# Patient Record
Sex: Female | Born: 1965
Health system: Southern US, Community
[De-identification: ages and names within clinical notes are randomized; demographics above are authoritative.]

## PROBLEM LIST (undated history)

## (undated) DIAGNOSIS — Z973 Presence of spectacles and contact lenses: Secondary | ICD-10-CM

## (undated) DIAGNOSIS — R509 Fever, unspecified: Secondary | ICD-10-CM

## (undated) DIAGNOSIS — K219 Gastro-esophageal reflux disease without esophagitis: Secondary | ICD-10-CM

## (undated) DIAGNOSIS — N186 End stage renal disease: Secondary | ICD-10-CM

## (undated) DIAGNOSIS — Z8639 Personal history of other endocrine, nutritional and metabolic disease: Secondary | ICD-10-CM

## (undated) DIAGNOSIS — M1990 Unspecified osteoarthritis, unspecified site: Secondary | ICD-10-CM

## (undated) DIAGNOSIS — L309 Dermatitis, unspecified: Secondary | ICD-10-CM

## (undated) DIAGNOSIS — Z940 Kidney transplant status: Secondary | ICD-10-CM

## (undated) DIAGNOSIS — R51 Headache: Secondary | ICD-10-CM

## (undated) DIAGNOSIS — I10 Essential (primary) hypertension: Secondary | ICD-10-CM

## (undated) DIAGNOSIS — R0602 Shortness of breath: Secondary | ICD-10-CM

## (undated) HISTORY — DX: Personal history of other endocrine, nutritional and metabolic disease: Z86.39

## (undated) HISTORY — PX: BREAST BIOPSY: SHX20

## (undated) HISTORY — PX: WISDOM TOOTH EXTRACTION: SHX21

## (undated) HISTORY — DX: Fever, unspecified: R50.9

## (undated) HISTORY — PX: FRACTURE SURGERY: SHX138

## (undated) HISTORY — DX: Essential (primary) hypertension: I10

## (undated) HISTORY — PX: AV FISTULA PLACEMENT: SHX1204

## (undated) HISTORY — DX: End stage renal disease: N18.6

## (undated) HISTORY — DX: Kidney transplant status: Z94.0

## (undated) SURGERY — Surgical Case
Anesthesia: *Unknown

---

## 1997-12-24 ENCOUNTER — Emergency Department (HOSPITAL_COMMUNITY): Admission: EM | Admit: 1997-12-24 | Discharge: 1997-12-24 | Payer: Self-pay | Admitting: Emergency Medicine

## 1997-12-26 ENCOUNTER — Emergency Department (HOSPITAL_COMMUNITY): Admission: EM | Admit: 1997-12-26 | Discharge: 1997-12-26 | Payer: Self-pay | Admitting: Emergency Medicine

## 2000-12-27 ENCOUNTER — Emergency Department (HOSPITAL_COMMUNITY): Admission: EM | Admit: 2000-12-27 | Discharge: 2000-12-27 | Payer: Self-pay | Admitting: Emergency Medicine

## 2000-12-29 ENCOUNTER — Encounter: Admission: RE | Admit: 2000-12-29 | Discharge: 2000-12-29 | Payer: Self-pay | Admitting: General Practice

## 2000-12-29 ENCOUNTER — Encounter: Payer: Self-pay | Admitting: General Practice

## 2004-05-21 ENCOUNTER — Emergency Department (HOSPITAL_COMMUNITY): Admission: EM | Admit: 2004-05-21 | Discharge: 2004-05-21 | Payer: Self-pay | Admitting: Emergency Medicine

## 2006-01-18 ENCOUNTER — Emergency Department (HOSPITAL_COMMUNITY): Admission: EM | Admit: 2006-01-18 | Discharge: 2006-01-18 | Payer: Self-pay | Admitting: Emergency Medicine
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
## 2007-01-12 ENCOUNTER — Inpatient Hospital Stay (HOSPITAL_COMMUNITY): Admission: EM | Admit: 2007-01-12 | Discharge: 2007-01-21 | Payer: Self-pay | Admitting: Emergency Medicine

## 2007-01-13 ENCOUNTER — Encounter (INDEPENDENT_AMBULATORY_CARE_PROVIDER_SITE_OTHER): Payer: Self-pay | Admitting: Nephrology

## 2007-01-13 ENCOUNTER — Encounter (INDEPENDENT_AMBULATORY_CARE_PROVIDER_SITE_OTHER): Payer: Self-pay | Admitting: Internal Medicine

## 2007-01-13 IMAGING — US US RENAL PORT
1 series · 14 of 25 positions shown · non-contrast
Comparison: None.

CLINICAL DATA: High blood pressure.   Evaluate renal size.  
RENAL/URINARY TRACT ULTRASOUND:
TECHNIQUE: Complete ultrasound examination of the urinary tract was performed including evaluation of the kidneys, renal collecting systems, and urinary bladder.

[Series 1: unknown · 0.32mm/px · 14 of 25 slices shown]
[im 1/25]
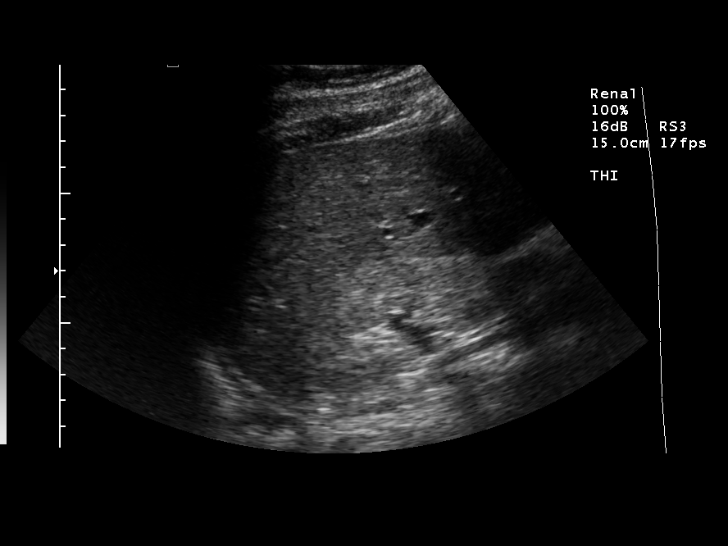
[im 3/25]
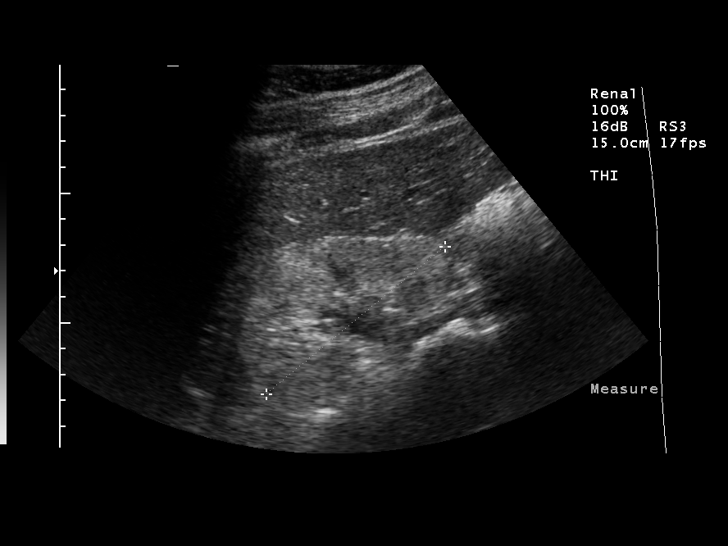
[im 5/25]
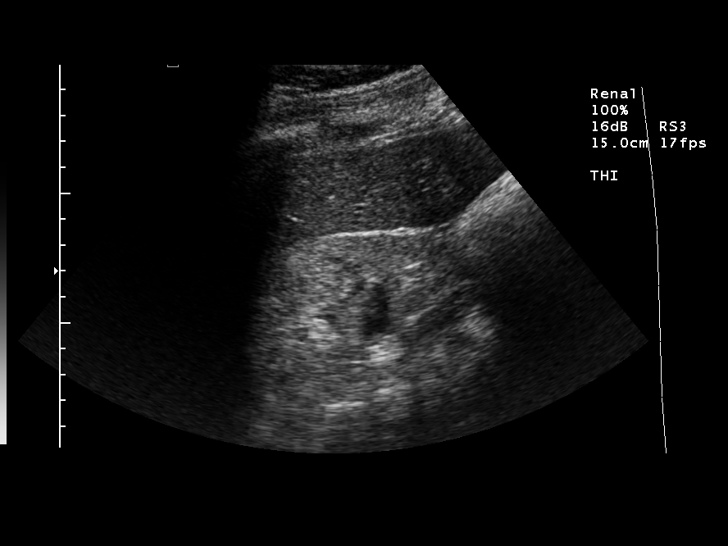
[im 7/25]
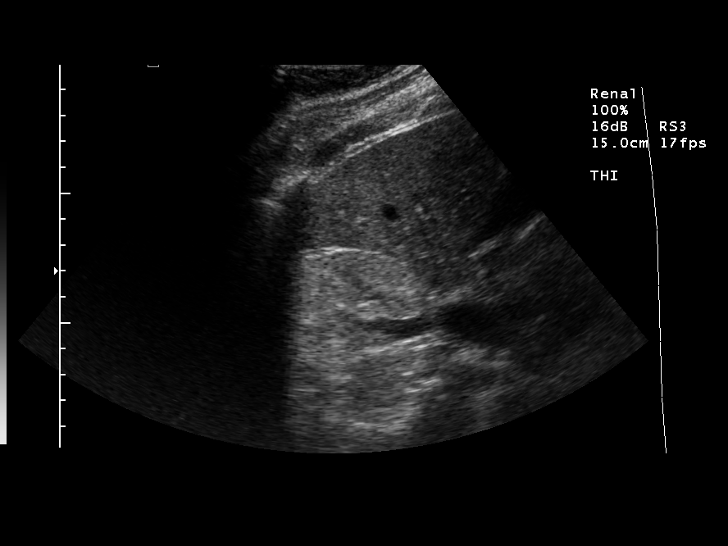
[im 9/25]
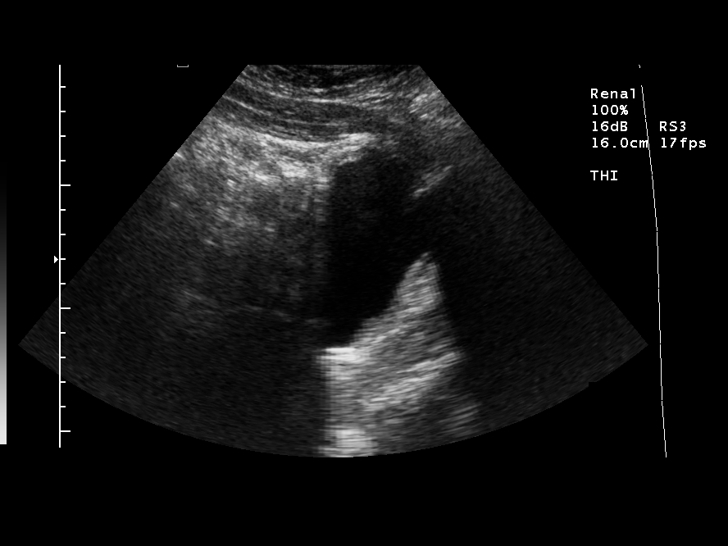
[im 10/25]
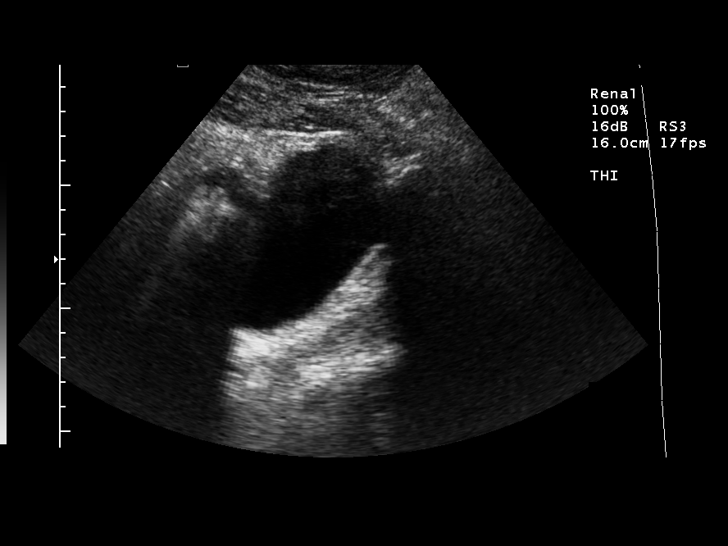
[im 12/25]
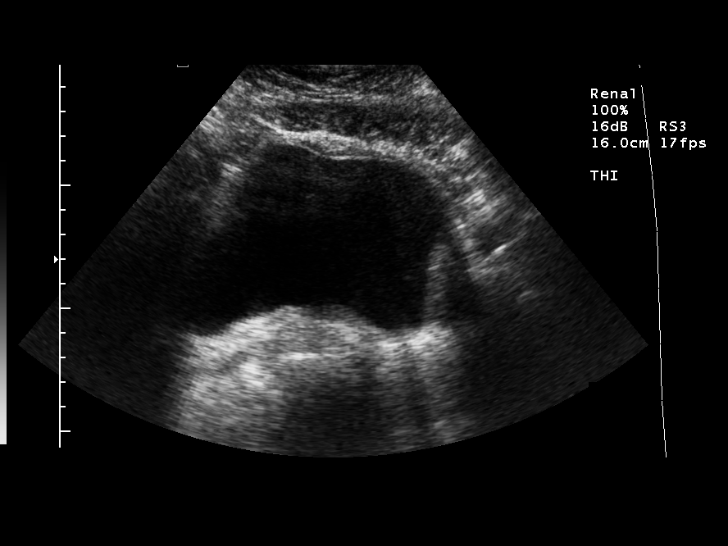
[im 14/25]
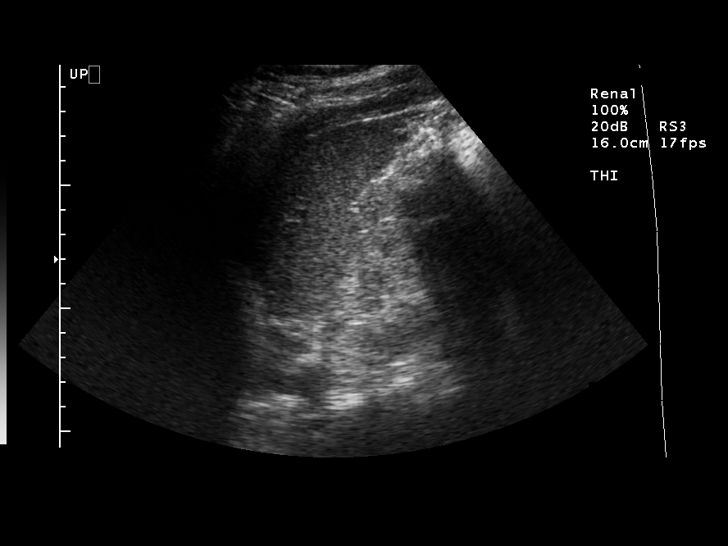
[im 16/25]
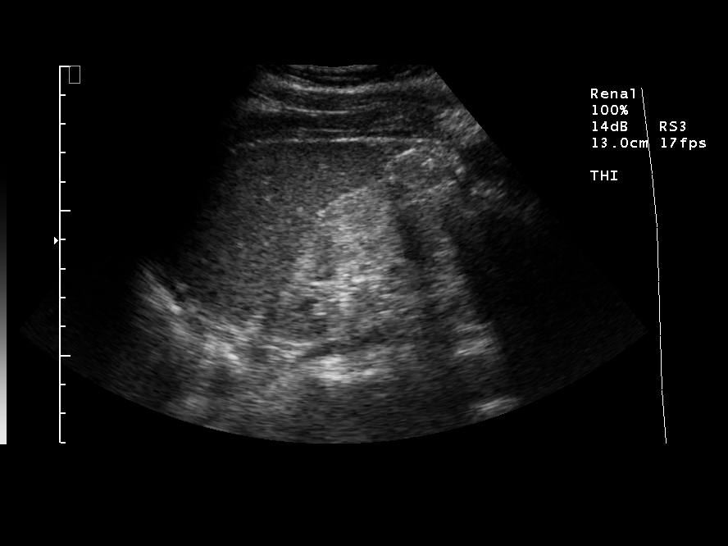
[im 17/25]
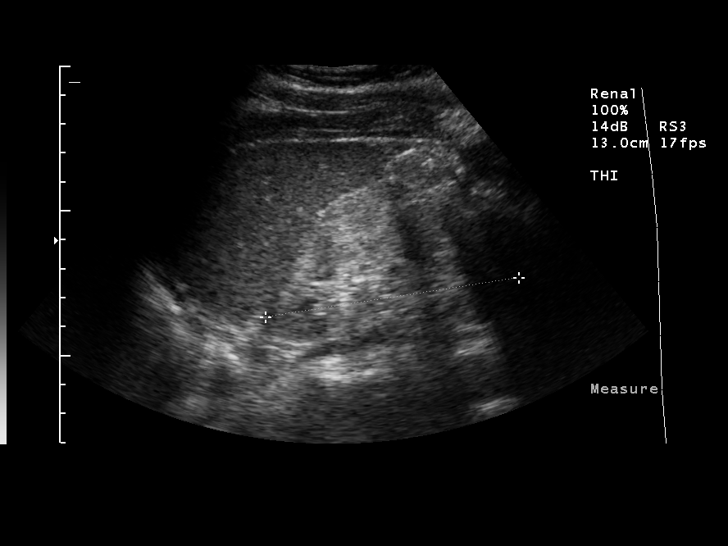
[im 19/25]
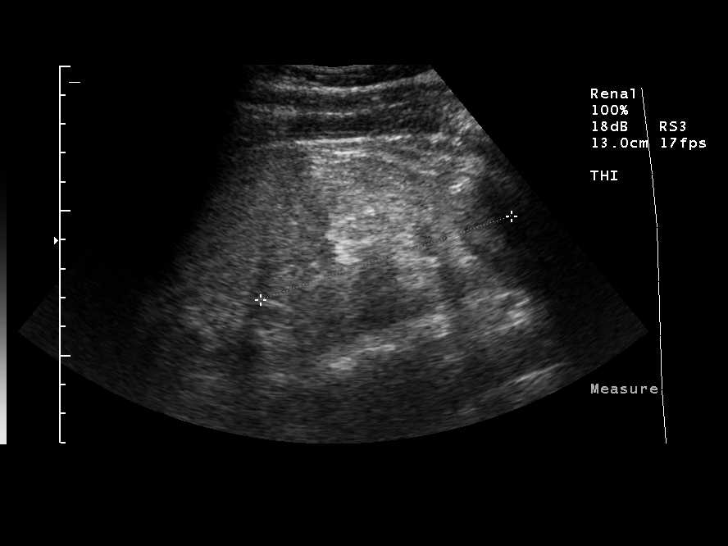
[im 21/25]
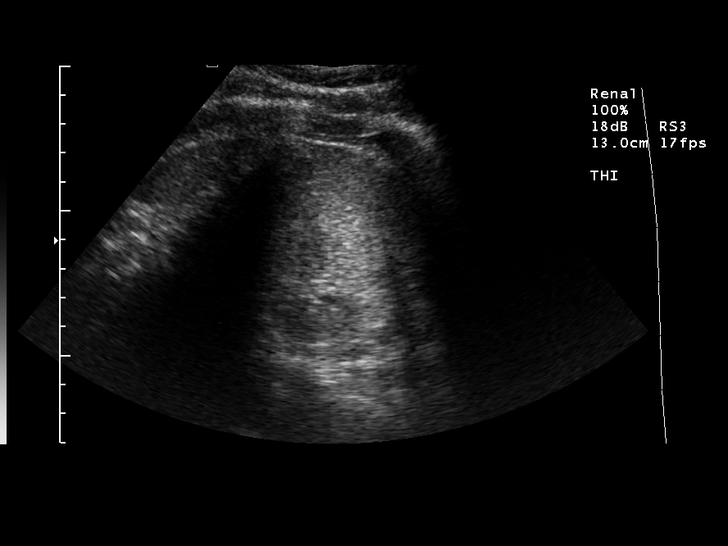
[im 23/25]
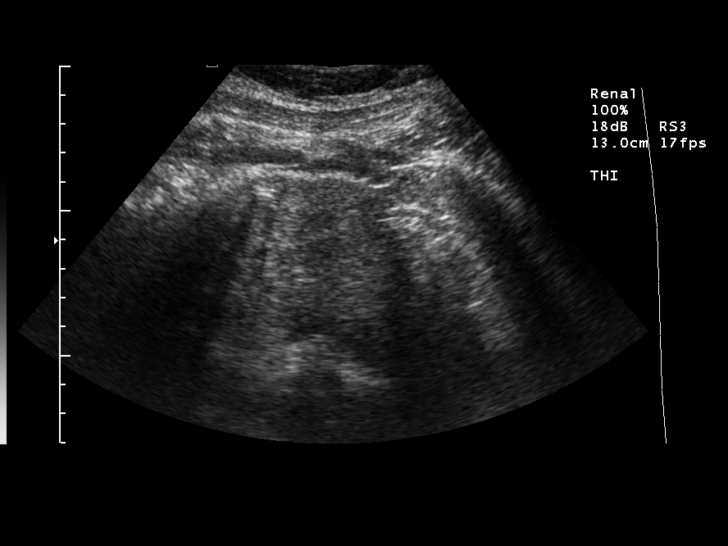
[im 25/25]
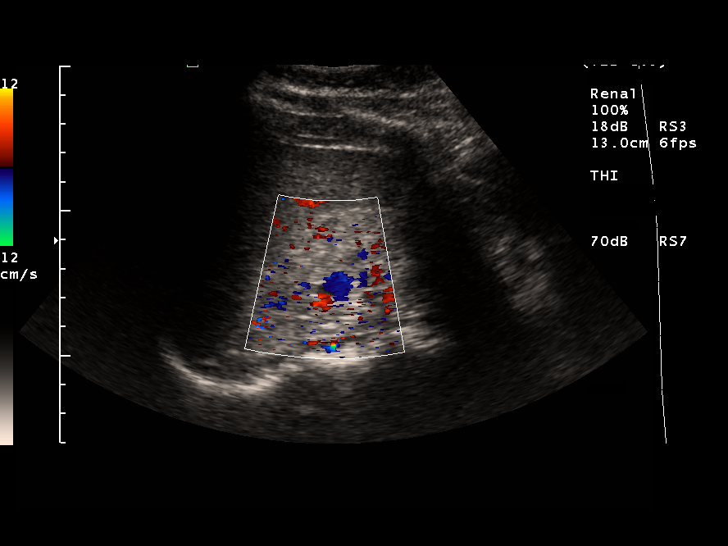

[14 of 25 positions shown; findings below may reference images not displayed]

FINDINGS: The kidneys appear slightly small in size and echogenic with the right kidney measuring 8.9 cm and the left kidney 9.1 cm in length. The left kidney was somewhat difficult to visualize adequately, however, no obvious renal mass or hydronephrosis. The bladder appears unremarkable. 
Right-sided pleural effusion noted.
IMPRESSION: Slightly small echogenic kidneys.  Somewhat limited evaluation of the left kidney without obvious mass or hydronephrosis.

## 2007-01-14 ENCOUNTER — Ambulatory Visit: Payer: Self-pay | Admitting: Vascular Surgery

## 2007-01-17 IMAGING — CR DG CHEST 1V PORT
1 series · 1 of 1 positions shown · non-contrast
Comparison: None

CLINICAL DATA: Hypertension. Catheter insertion.

CHEST - 1 VIEW

[view not recorded]
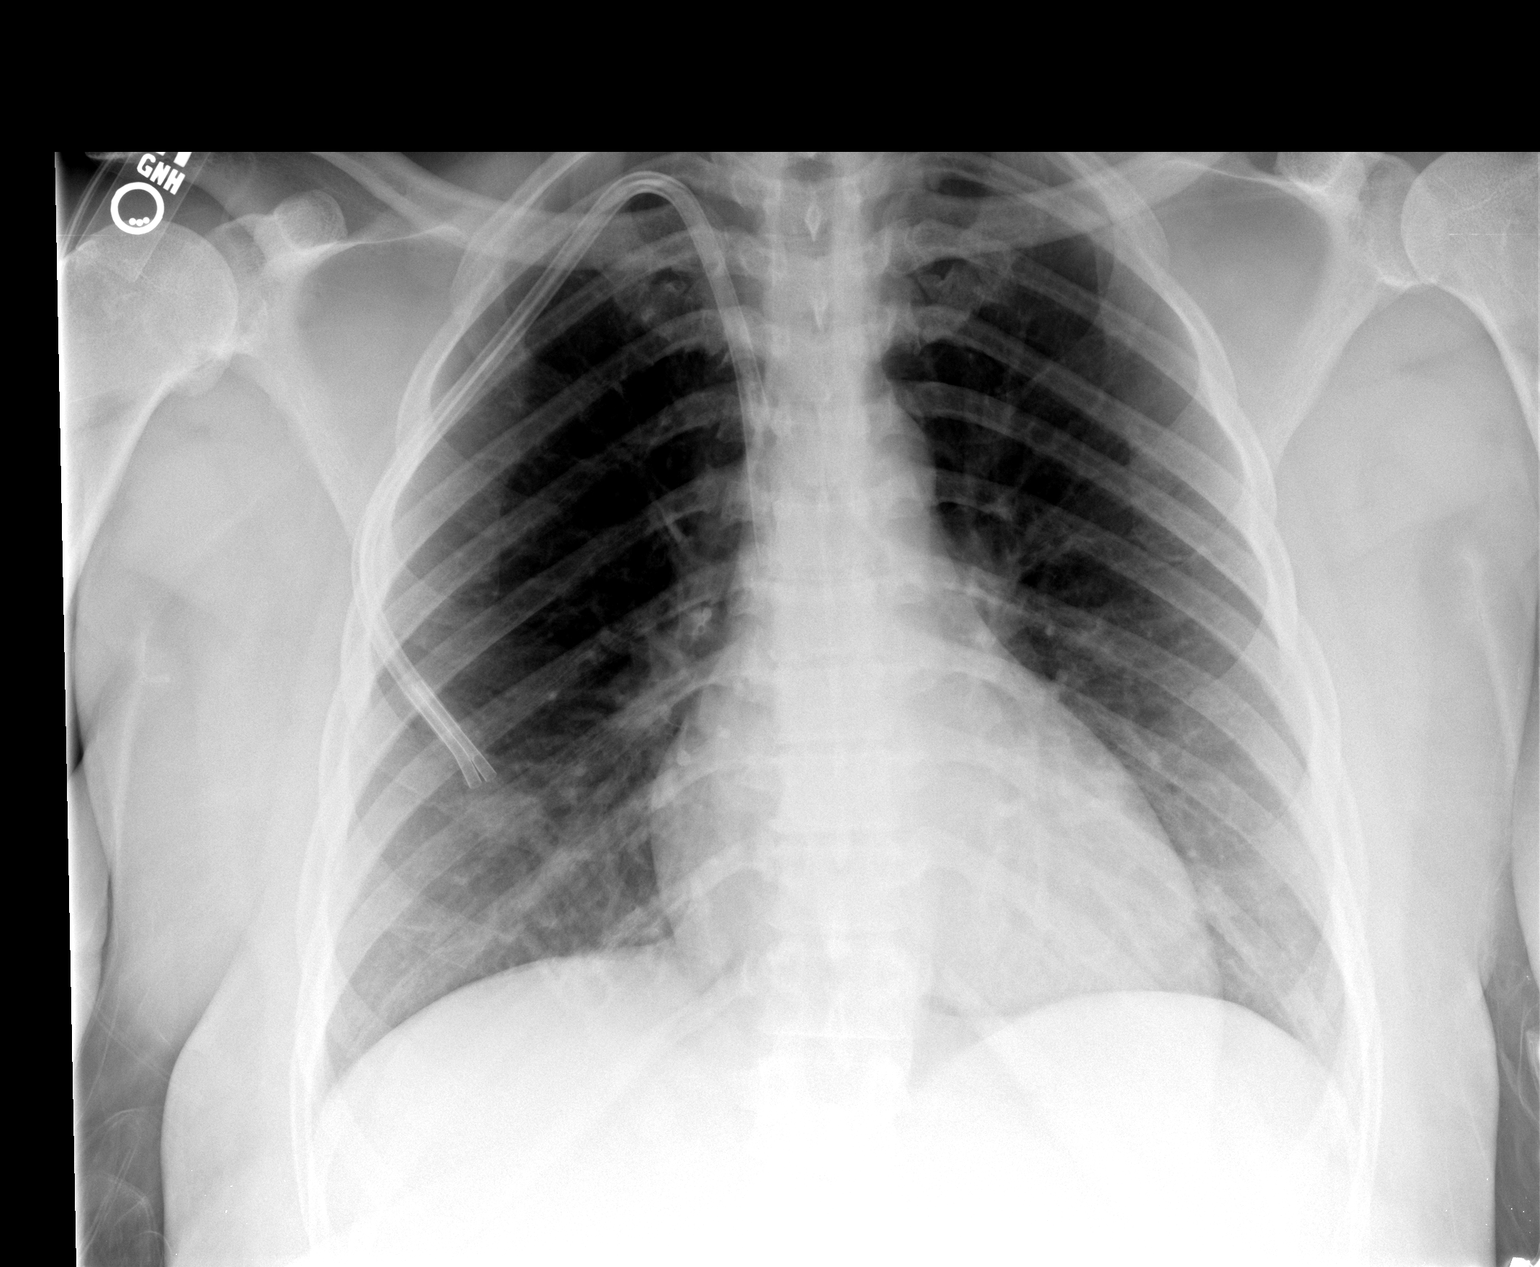

[1 of 1 positions shown; findings below may reference images not displayed]

FINDINGS: right-sided dialysis catheter with tips at high and mid SVC . no
pneumothorax.

Midline trachea. Normal heart size and mediastinal contours. Sharp costophrenic
angles. Clear lungs.

IMPRESSION

Right-sided dialysis catheter tips at the high and mid SVC. No pneumothorax.

## 2007-05-06 ENCOUNTER — Ambulatory Visit (HOSPITAL_COMMUNITY): Admission: RE | Admit: 2007-05-06 | Discharge: 2007-05-06 | Payer: Self-pay | Admitting: Nephrology

## 2007-05-06 IMAGING — XA IR VENTRICULOPERITONEALSHUNT EVAL/INJ
1 series · 12 of 12 positions shown · non-contrast
Comparison: none

CLINICAL DATA: Increased venous pressure.
 LEFT ARM AV FISTULAGRAM ? [DATE] ? [5A] HOURS:
 Procedure:  The left arm was prepped and draped in a sterile fashion.  An 18-gauge Angiocath was inserted into the outflow cephalic vein.  Contrast was injected.  The Angiocath was removed.  Hemostasis was achieved with direct pressure.  No complications.

[Series 1000: run · 0.25mm/px · 12 of 12 slices shown]
[im 1/12]
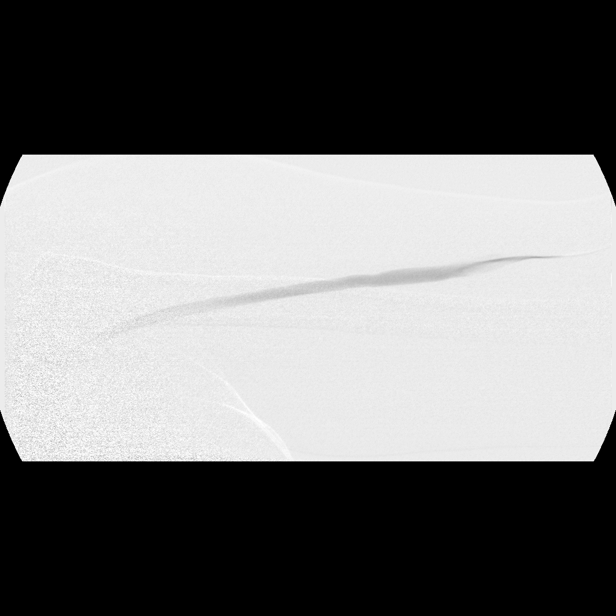
[im 2/12]
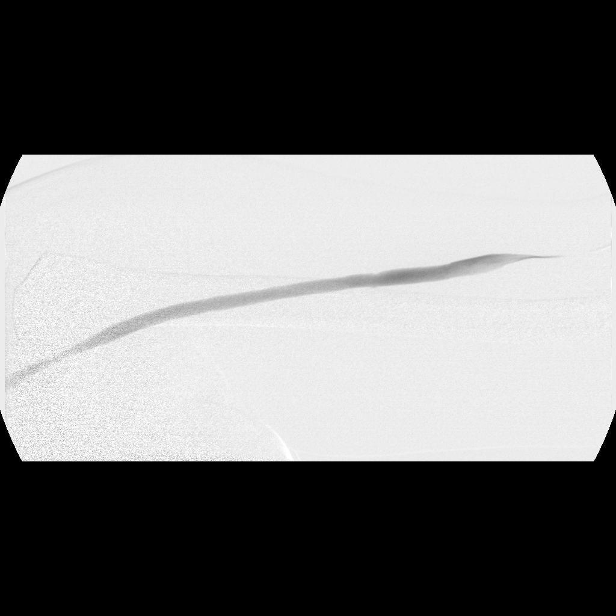
[im 3/12]
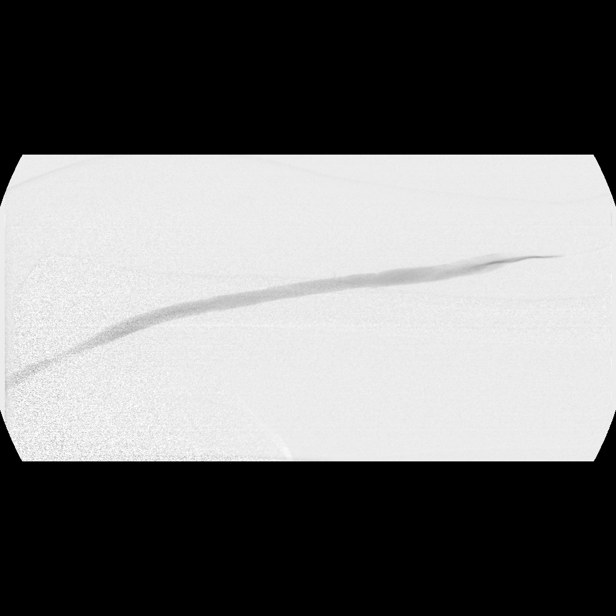
[im 4/12]
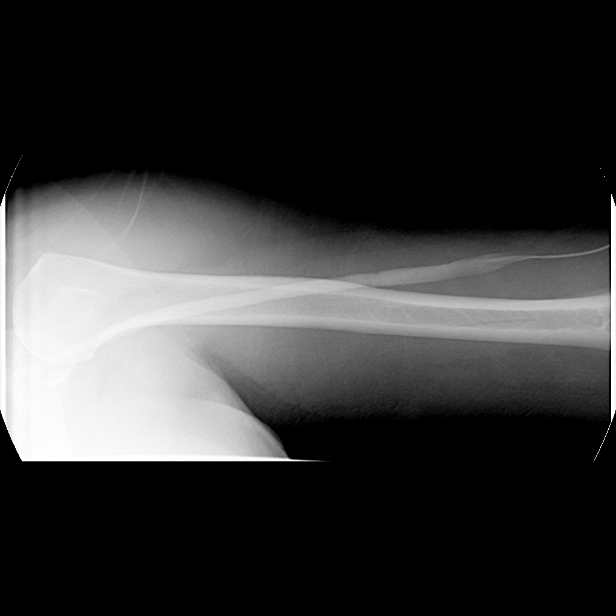
[im 5/12]
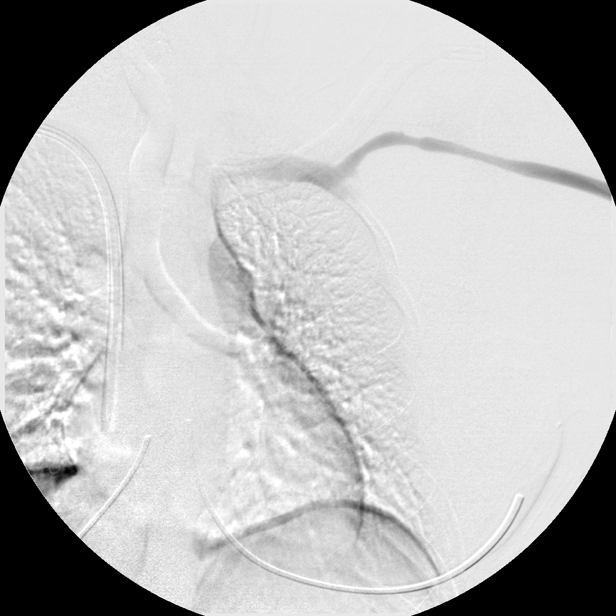
[im 6/12]
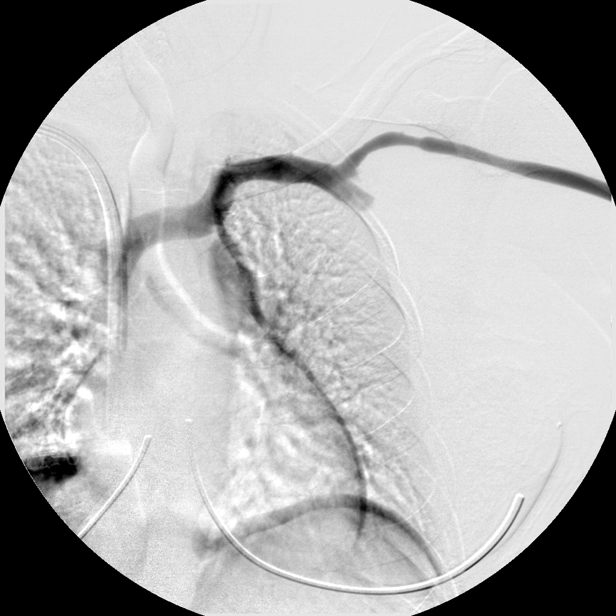
[im 7/12]
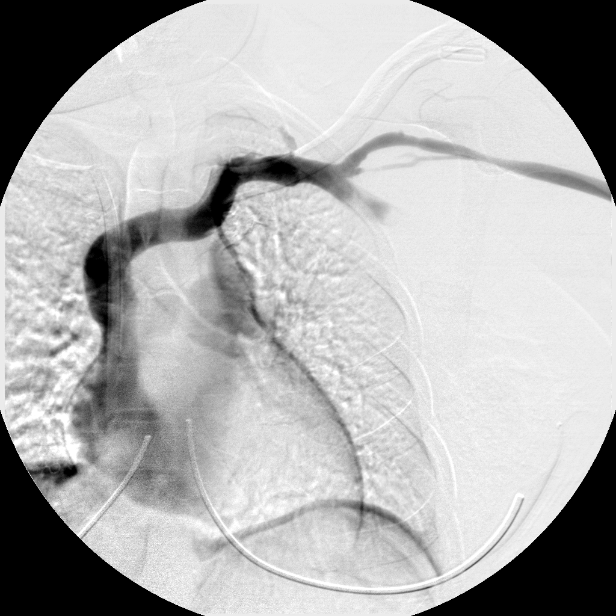
[im 8/12]
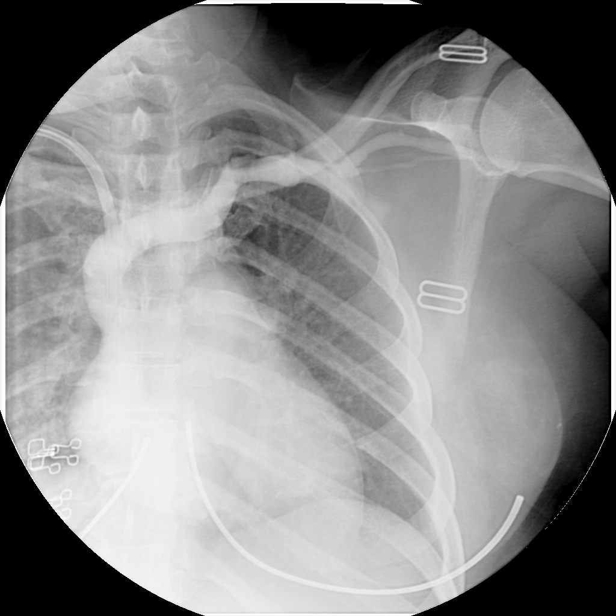
[im 9/12]
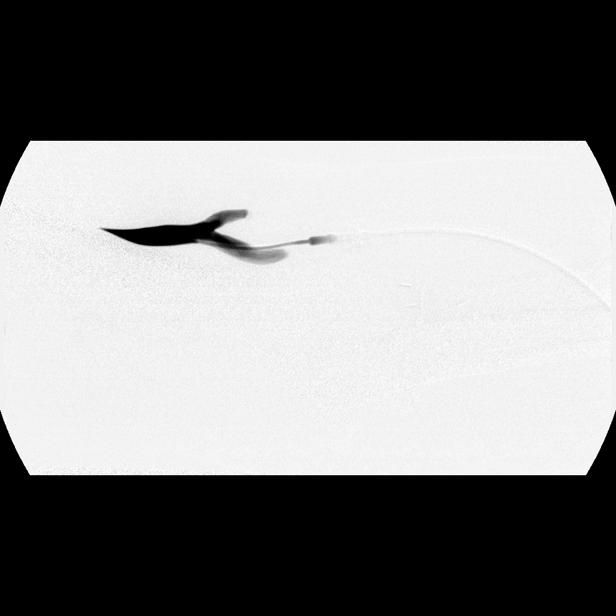
[im 10/12]
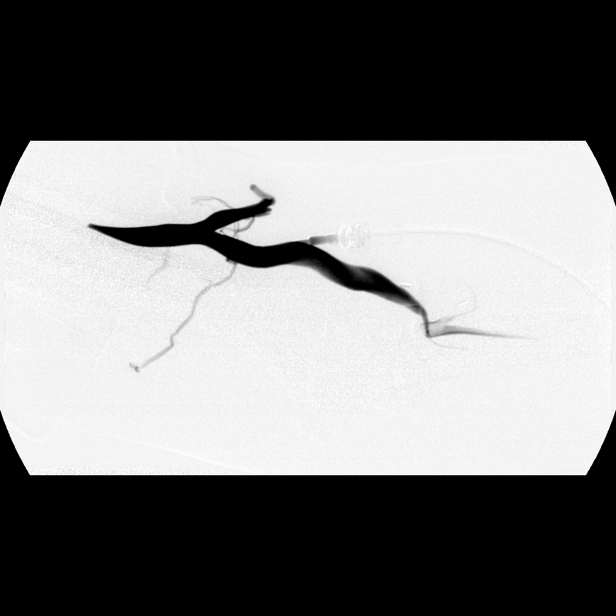
[im 11/12]
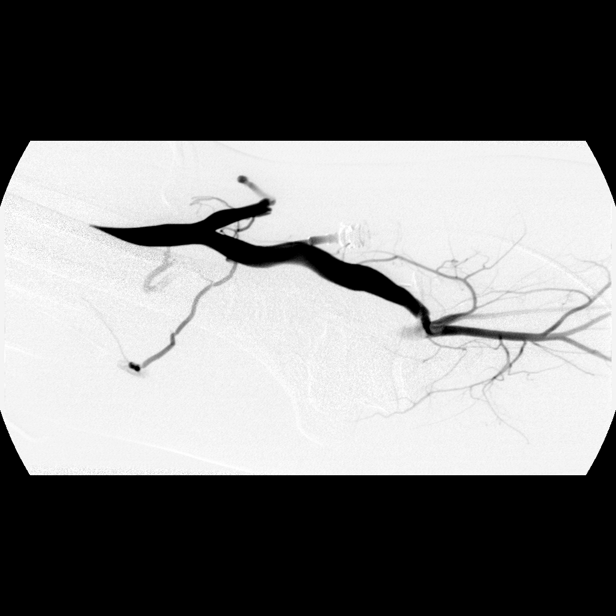
[im 12/12]
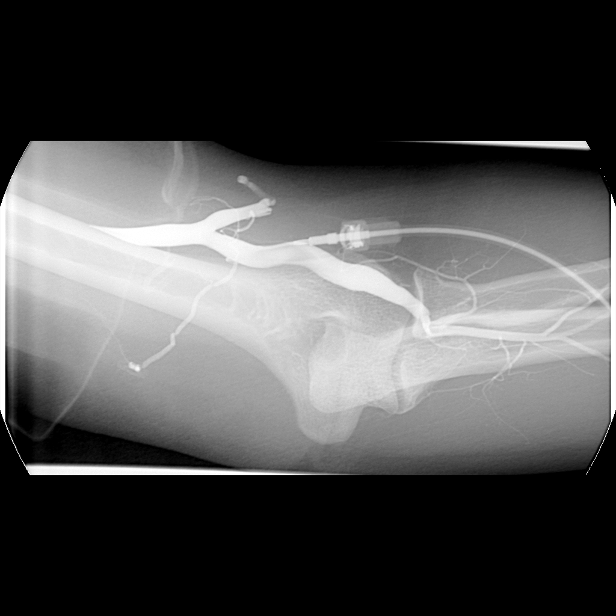

[12 of 12 positions shown; findings below may reference images not displayed]

FINDINGS: The arteriovenous anastomosis in the antecubital fossa is patent.  The outflow cephalic vein is patent.  Central venous structures are patent.
IMPRESSION: Left arm AV fistula circuit is widely patent.

## 2007-05-25 ENCOUNTER — Ambulatory Visit: Payer: Self-pay | Admitting: Surgery

## 2007-05-25 ENCOUNTER — Ambulatory Visit (HOSPITAL_COMMUNITY): Admission: RE | Admit: 2007-05-25 | Discharge: 2007-05-25 | Payer: Self-pay | Admitting: Nephrology

## 2008-06-06 ENCOUNTER — Emergency Department (HOSPITAL_COMMUNITY): Admission: EM | Admit: 2008-06-06 | Discharge: 2008-06-06 | Payer: Self-pay | Admitting: Emergency Medicine

## 2008-11-02 HISTORY — PX: KIDNEY TRANSPLANT: SHX239

## 2010-06-13 ENCOUNTER — Encounter (HOSPITAL_COMMUNITY)
Admission: RE | Admit: 2010-06-13 | Discharge: 2010-09-03 | Payer: Self-pay | Source: Home / Self Care | Attending: Nephrology | Admitting: Nephrology

## 2010-07-10 ENCOUNTER — Inpatient Hospital Stay (HOSPITAL_COMMUNITY)
Admission: AD | Admit: 2010-07-10 | Discharge: 2010-07-13 | Payer: Self-pay | Source: Home / Self Care | Attending: Nephrology | Admitting: Nephrology

## 2010-07-11 IMAGING — CR DG CHEST 2V
2 series · 2 of 2 positions shown · non-contrast
Comparison: Chest x-ray [DATE]

CLINICAL DATA: Fever.

CHEST - 2 VIEW

[w chest pa]
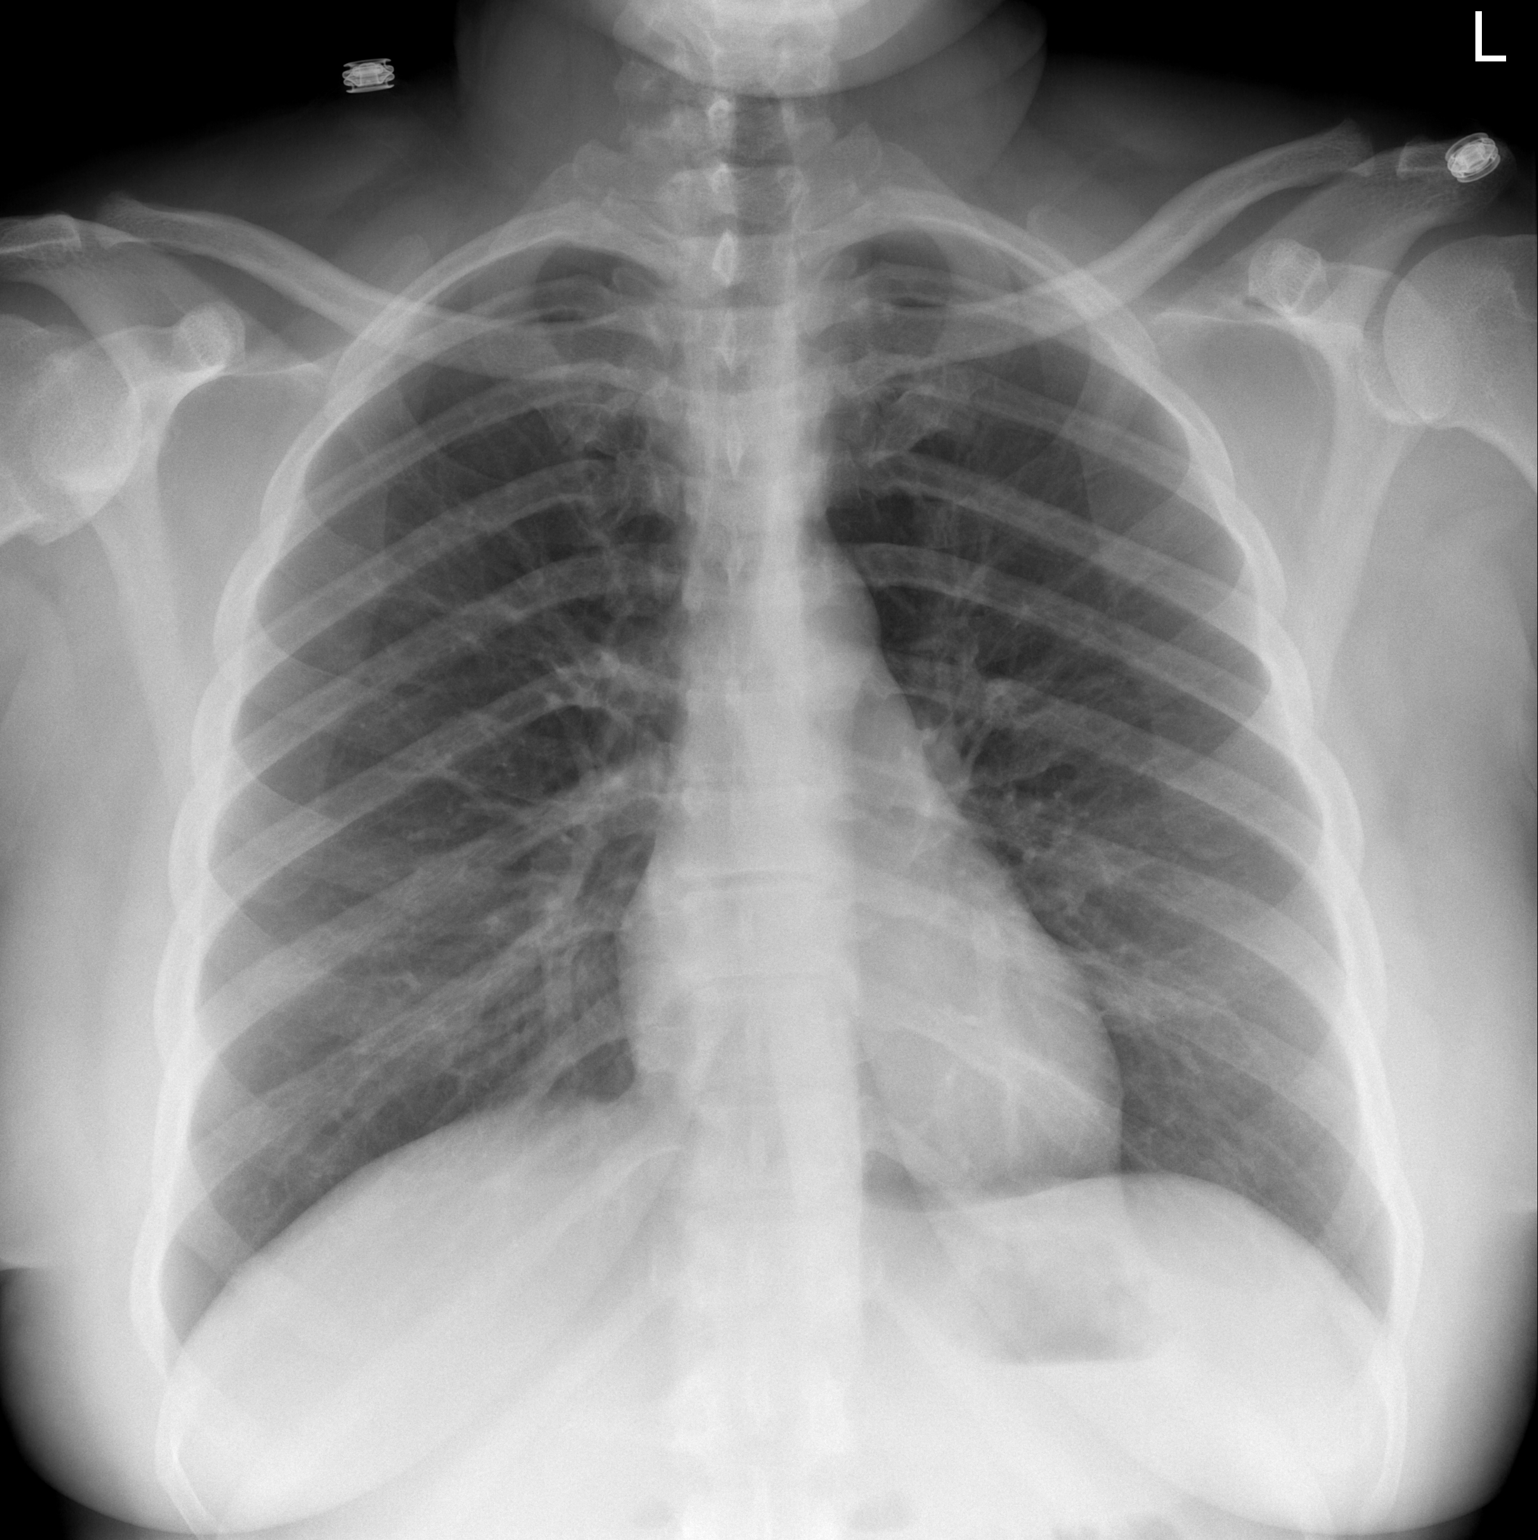

[w chest lat]
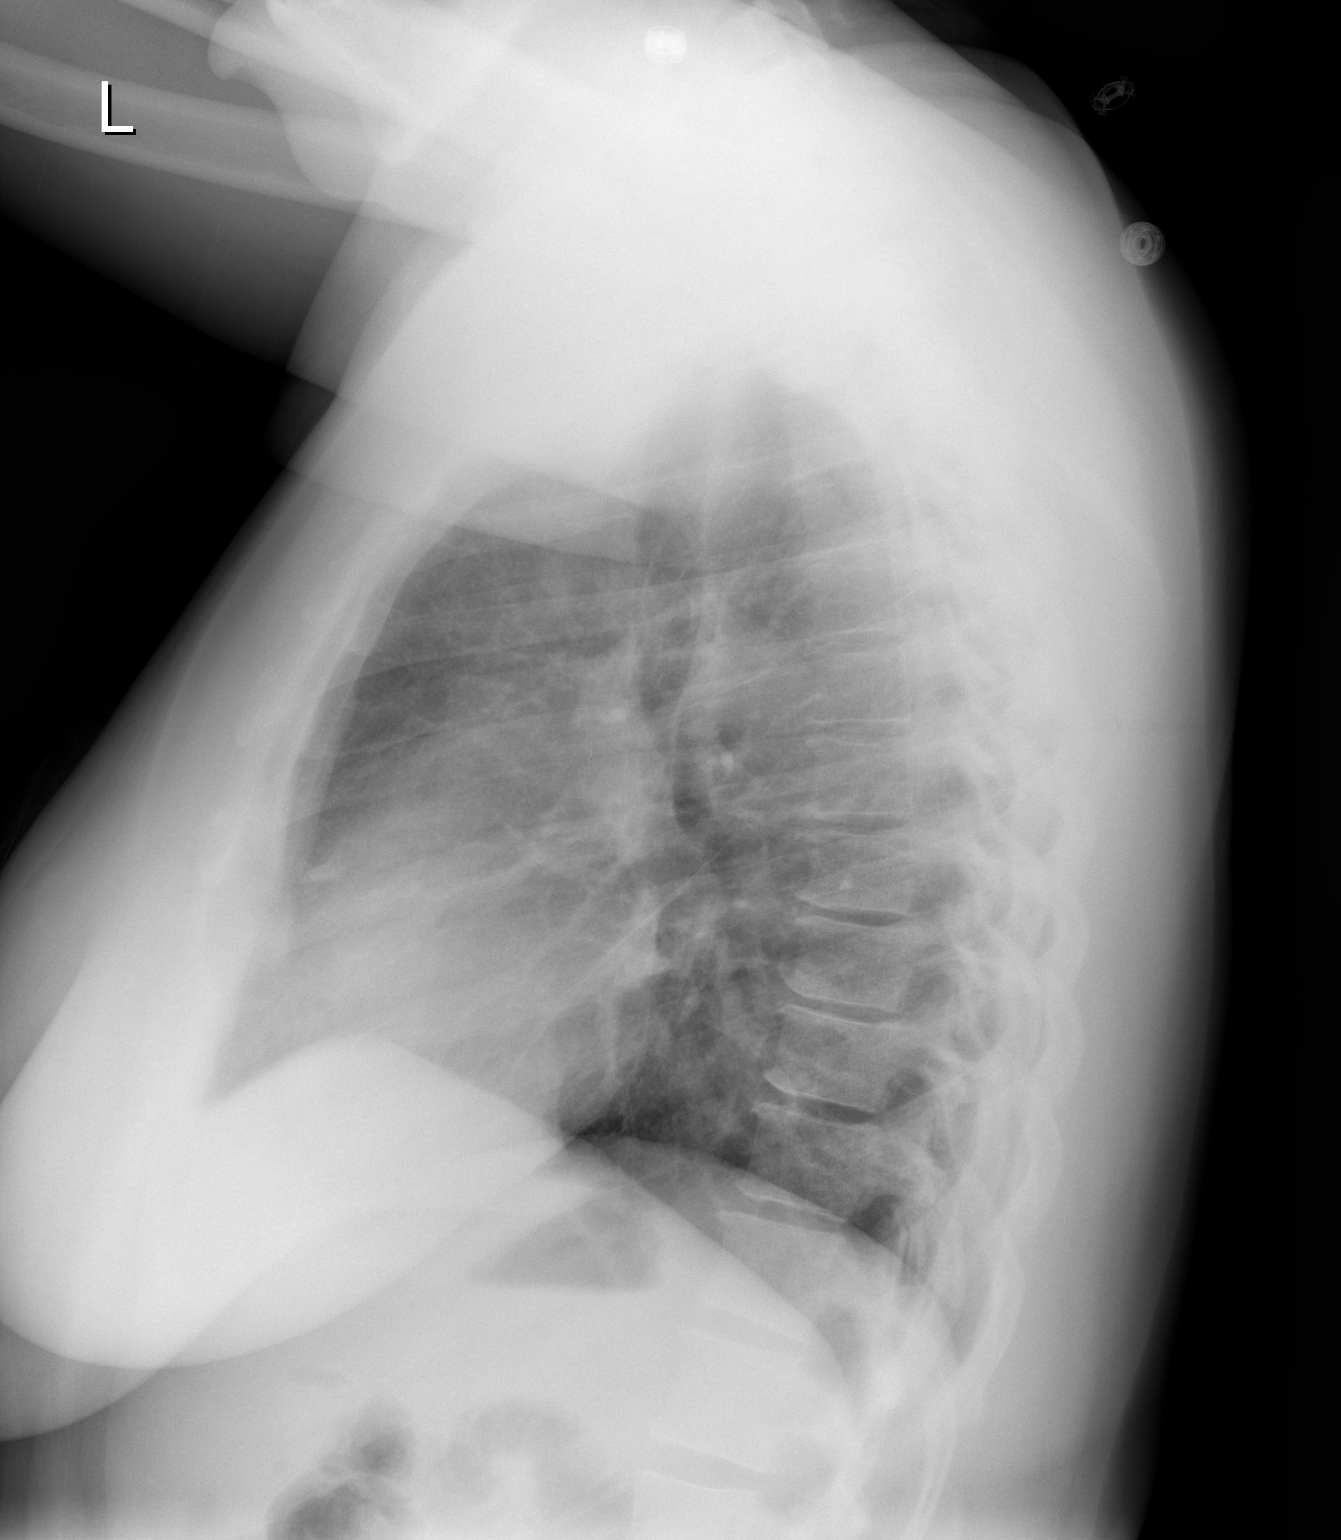

[2 of 2 positions shown; findings below may reference images not displayed]

FINDINGS: The cardiac silhouette, mediastinal and hilar contours
are within normal limits.  The lungs are clear.  The bony thorax is
intact.
IMPRESSION: No acute cardiopulmonary findings.

## 2010-10-14 LAB — CBC
HCT: 36.3 % (ref 36.0–46.0)
MCH: 28 pg (ref 26.0–34.0)
MCHC: 32.5 g/dL (ref 30.0–36.0)
MCV: 85.4 fL (ref 78.0–100.0)
MCV: 85.8 fL (ref 78.0–100.0)
MCV: 85.8 fL (ref 78.0–100.0)
Platelets: 176 10*3/uL (ref 150–400)
Platelets: 178 10*3/uL (ref 150–400)
RDW: 19.9 % — ABNORMAL HIGH (ref 11.5–15.5)
WBC: 19.8 10*3/uL — ABNORMAL HIGH (ref 4.0–10.5)
WBC: 21.8 10*3/uL — ABNORMAL HIGH (ref 4.0–10.5)

## 2010-10-14 LAB — COMPREHENSIVE METABOLIC PANEL
ALT: 18 U/L (ref 0–35)
AST: 19 U/L (ref 0–37)
Alkaline Phosphatase: 48 U/L (ref 39–117)
BUN: 10 mg/dL (ref 6–23)
CO2: 24 mEq/L (ref 19–32)
Chloride: 106 mEq/L (ref 96–112)
GFR calc Af Amer: 60 mL/min — ABNORMAL LOW (ref >60)
Potassium: 3.6 mEq/L (ref 3.5–5.1)
Sodium: 136 mEq/L (ref 135–145)
Total Protein: 6.5 g/dL (ref 6.0–8.3)

## 2010-10-14 LAB — RENAL FUNCTION PANEL
Albumin: 3.3 g/dL — ABNORMAL LOW (ref 3.5–5.2)
BUN: 14 mg/dL (ref 6–23)
CO2: 23 mEq/L (ref 19–32)
Calcium: 10 mg/dL (ref 8.4–10.5)
Calcium: 9.6 mg/dL (ref 8.4–10.5)
Chloride: 107 mEq/L (ref 96–112)
Creatinine, Ser: 1.14 mg/dL (ref 0.4–1.2)
GFR calc Af Amer: 52 mL/min — ABNORMAL LOW (ref >60)
Glucose, Bld: 304 mg/dL — ABNORMAL HIGH (ref 70–99)
Phosphorus: 3.2 mg/dL (ref 2.3–4.6)
Potassium: 3.5 mEq/L (ref 3.5–5.1)
Sodium: 137 mEq/L (ref 135–145)

## 2010-10-14 LAB — DIFFERENTIAL
Eosinophils Absolute: 0 10*3/uL (ref 0.0–0.7)
Monocytes Absolute: 0.9 10*3/uL (ref 0.1–1.0)
Neutro Abs: 13 10*3/uL — ABNORMAL HIGH (ref 1.7–7.7)
Neutrophils Relative %: 88 % — ABNORMAL HIGH (ref 43–77)

## 2010-10-14 LAB — MISCELLANEOUS TEST

## 2010-10-14 LAB — CULTURE, BLOOD (ROUTINE X 2)

## 2010-10-14 LAB — CMV (CYTOMEGALOVIRUS) DNA ULTRAQUANT, PCR: CMV DNA Quant: <200 copies/mL (ref <200)

## 2010-10-14 LAB — CYTOMEGALOVIRUS PCR, QUALITATIVE: Cytomegalovirus DNA: NOT DETECTED

## 2010-10-31 ENCOUNTER — Emergency Department (HOSPITAL_COMMUNITY)
Admission: EM | Admit: 2010-10-31 | Discharge: 2010-10-31 | Disposition: A | Discharge status: Home / Self Care | Payer: Medicare Other | Attending: Emergency Medicine | Admitting: Emergency Medicine

## 2010-10-31 DIAGNOSIS — F17200 Nicotine dependence, unspecified, uncomplicated: Secondary | ICD-10-CM | POA: Insufficient documentation

## 2010-10-31 DIAGNOSIS — I10 Essential (primary) hypertension: Secondary | ICD-10-CM | POA: Insufficient documentation

## 2010-10-31 DIAGNOSIS — Z79899 Other long term (current) drug therapy: Secondary | ICD-10-CM | POA: Insufficient documentation

## 2010-10-31 DIAGNOSIS — M79609 Pain in unspecified limb: Secondary | ICD-10-CM | POA: Insufficient documentation

## 2010-12-17 NOTE — Discharge Summary (Signed)
NAME:  Ruth Gutierrez, Ruth Gutierrez NO.:  [id number]   MEDICAL RECORD NO.:  [id number]          PATIENT TYPE:  INP   LOCATION:  [id number]                         FACILITY:  [facility]   PHYSICIAN:  Ruth Leep, MD     DATE OF BIRTH:  1965-11-07   DATE OF ADMISSION:  01/12/2007  DATE OF DISCHARGE:  01/18/2007                               DISCHARGE SUMMARY   DISCHARGE DIAGNOSES:  1. Chronic kidney disease, stage 4.  2. Normocytic anemia.  3. Mild leukocytosis.  4. Malignant hypertension.  5. Questionable hypertensive retinopathy.  6. Tobacco abuse.  7. Secondary hyperparathyroidism.  8. Proteinuria.  9. Thrombocytopenia.   DISCHARGE MEDICATIONS:  1. Nephro-Vite 1 p.o. daily.  2. Aranesp 200 mcg subcutaneously on Thursdays at dialysis.  3. Prilosec over-the-counter 20 mg tablet 1 p.o. daily.  4. Hectorol 4 mcg IV on Tuesdays, Thursdays, and Saturdays at      dialysis.  5. PhosLo 667 mg tablet, 2 p.o. three times a day.  6. Iron dextran complex 50 mg IV on Thursdays at dialysis.  7. Tylenol 650 mg p.o. q.4-6h. p.r.n.   PROCEDURES:  1. On January 17, 2007, chest x-ray.  Impression:  Right-sided dialysis      catheter tip at the high and mid SVC.  No pneumothorax.  2. On January 13, 2007, renal ultrasound.  Impression:  Slight small      echogenic kidneys.  Somewhat limited evaluation of the left kidney      without obvious mass or hydronephrosis.   PERTINENT LABORATORY DATA:  Basic metabolic panel on January 19, 2007  revealed a sodium of 138, potassium 4.1, chloride 98, bicarb 29, glucose  88, BUN 37, creatinine 6.71.  GFR of 8.  CBC with hemoglobin of 8.5,  hematocrit 25.4, white blood cells 12.2,  MCV 91.7, platelets 129 which  were 237 yesterday.  Urine protein electrophoresis:  Total protein 2798  mg/dl.  Volume is 2500 mL.  Total protein 112 mg/dl, free kappa light  chains 10.4 mg/dl, free kappa excretion/day 260 mg/day, free lambda  light chains 3.5 mg/dl, free lambda excretion  87.5 mg/day, free  kappa/lambda ratio of 2.97.  Neutrophil cytoplasmic antibody IgG less  than 1.20.  Glomerular basement membrane antibody is 1.  Serum protein  electrophoresis with total protein 5.8, serum albumin 50.6, alpha-1 7,  alpha-2 14.1, beta 7.1, beta 2 4.7, gammaglobulin 16.5, and no M-spike  detected.  Hepatitis B surface antigen negative.  ANA negative.  Parathormone intact 403.  Total calcium 7.2.  Iron 51, total iron  binding capacity 252, percentage saturation 20, ferritin of 45.  TSH  0.705.  Urinalysis with greater than 300 mg/dl of protein, negative  nitrites, trace leukocytes, no blood, no glucose.   CONSULTATIONS:  Renal, Dr. Jimmy Gutierrez.   HOSPITAL COURSE AND PATIENT DISPOSITION:  For details of the initial  part of the admission, please refer to the history and physical note  that was done by Dr. Arnoldo Morale.  In summary, Ruth Gutierrez is a 45-year-  old pleasant African American female patient with no known past medical  history, significant history of renal disease in the  family, with a  younger brother on dialysis for the last 4 years, who was sent by her  ophthalmologist with history of blurred vision for 2 weeks.  She was  sent for evaluation of hypertension and diabetes.  She also reported  having headaches, nausea, and vomiting.  On further evaluation in the  emergency room, she was noted to have markedly elevated blood pressure  of 227/135.  She was also noted to have markedly elevated creatinine of  7.9, whereby the patient was admitted to the stepdown unit with an  assessment of malignant hypertension and acute-on-chronic renal failure  for further evaluation and management.   Problem 1.  Malignant hypertension.  The patient was admitted to the  stepdown unit.  She was initially placed on IV nitroglycerin drip, oral  clonidine, and labetalol.  Once her blood pressures were better-  controlled, her nitroglycerin was tapered off and she was managed on  oral  medications along.  However, with starting dialysis, her blood  pressure dropped in the 90s whereby the antihypertensive medications  have been discontinued, and her blood pressures are controlled in the  [id number] to [id number] systolics.  To continue following her blood pressure closely.   Problem 2.  Chronic kidney disease, stage 4.  A renal consult was  requested.  An extensive renal workup was done, as indicated above.  It  is thought that she might have stage 4 chronic kidney disease.  The  patient initially refused placement of the dialysis catheter and the AV  fistula and dialysis; however, she subsequently agreed to that whereby  dialysis catheter was placed.  The patient has had dialysis x2 and is  being scheduled for regular dialysis as an outpatient by the renal team.  The patient also had an AV fistula created in the left forearm.  She is  to continue her NephroVite and PhosLo.   Problem 3.  Normocytic anemia which has remained stable.  The patient is  on Aranesp and iron dextran complex across dialysis.  Her CBCs have to  be closely followed.   Problem 4.  Mild leukocytosis with white blood cells in the 12,000 range  with no focus of sepsis.  This is probably stress related and can be  followed up.   Problem 5.  Questionable hypertensive retinopathy.  To aim for strict  hypertension control and outpatient followup with ophthalmology.   Problem 6.  Tobacco abuse.  The patient has been counseled; however, she  refused any nicotine patch at this time.   Problem 7.  Secondary hyperparathyroidism secondary to her chronic  kidney disease.  To continue PhosLo.   Problem 8.  Proteinuria- unclear etiology. Management per renal team.   Problem 9.  Thrombocytopenia with no associated bleeding, unclear  etiology.  Lovenox will be discontinued, and this has to be followed up  with a repeat CBC.   I have on multiple occasions discussed the patient's diagnosis and  prognosis, with and  without treatment, in detail with the patient, the  patient's mother, and her sisters.  I was available for all questions  which were answered, and they expressed satisfaction and understanding.  I was also available for any further questions.  With a strong family  history of chronic kidney disease in 2 of the siblings, I have advised  that the other siblings also get tested by their primary medical doctors  with urine and blood testing to evaluate their renal function.  I have  brought this to the  attention of the renal physicians also.  On discussion with the renal  physicians, the patient is also said to be a good candidate for  transplant.  The patient does not have a primary care physician at this  time; hence, a referral will be provided to [facility].      Ruth Leep, MD  Electronically Signed     AH/MEDQ  D:  01/19/2007  T:  01/19/2007  Job:  [id number]   cc:   Louis Meckel, M.D.  James L. Deterding, M.D.

## 2010-12-17 NOTE — H&P (Signed)
NAME:  Ruth Gutierrez, Ruth Gutierrez NO.:  [id number]   MEDICAL RECORD NO.:  [id number]          PATIENT TYPE:  INP   LOCATION:  2113                         FACILITY:  [facility]   PHYSICIAN:  Jana Hakim, M.D. DATE OF BIRTH:  02-25-1966   DATE OF ADMISSION:  01/12/2007  DATE OF DISCHARGE:                              HISTORY & PHYSICAL   This is an unassigned patient.   CHIEF COMPLAINT:  Decreased vision, left eye.   HISTORY OF PRESENT ILLNESS:  This is a 45 year old female who presented  to the emergency department after going to eye doctor for an eye exam,  after complaints of blurry vision off and on for 2 weeks and decreasing  vision in her left eye.  She was evaluated by the ophthalmologist and  advised to go to the emergency department for evaluation.  The  ophthalmologist referred the patient to the ER for evaluation of  hypertension or diabetes.  The patient reports having headaches off and  on along with nausea and vomiting off and on.  She denies having any  fevers, chills or chest pain.   In the emergency department the patient was evaluated and found to have  a blood pressure of 227/135.  She was started on medications to help  decrease her blood pressure with IV labetalol.  Prior to this the  patient reports having no medical problems.   PAST MEDICAL HISTORY:  None.   PAST SURGICAL HISTORY:  None.   MEDICATIONS:  None.   ALLERGIES:  NO KNOWN DRUG ALLERGIES.   SOCIAL HISTORY:  The patient lives at home with her daughter.  No  history of tobacco.  No history of alcohol and denies illicit drug  usage.   FAMILY HISTORY:  Positive for hypertension in her mother and maternal  grandmother.  Positive for coronary artery disease in her maternal aunt.  Positive for diabetes in her brother.  No history of cancer in the  family that she knows of.   PHYSICAL EXAMINATION FINDINGS:  HEENT:  Normocephalic, atraumatic.  Pupils equally round and reactive to light.   Extraocular muscles are  intact.  Unable to visualize fundi.  Oropharynx is clear.  NECK:  Supple full range of motion.  No thyromegaly, adenopathy or  jugular venous distension.Marland Kitchen  CARDIOVASCULAR:  Regular rate and rhythm.  No murmurs, gallops or rubs.  LUNGS: Clear to auscultation bilaterally.  ABDOMEN:  Positive bowel sounds; soft, nontender, nondistended.  No  hepatosplenomegaly.  No rebound or guarding.  EXTREMITIES:  Without cyanosis, clubbing or edema.  NEUROLOGIC: Alert and oriented x3.  Cranial nerves intact, except  decreased vision in the left eye.  There are no focal deficits.   LABORATORY STUDIES:  Sodium 137, potassium 4.7, chloride 109, bicarb  20.4, BUN 69, creatinine 7.9, glucose 85.  Urinalysis with moderate  hemoglobin, total protein greater than 300, trace leukocyte esterase.   ASSESSMENT:  A 45 year old female being admitted with:  1. Malignant hypertension.  2. Chronic renal failure secondary to #1.  3. Proteinuria secondary to #1.   PLAN:  The patient will be admitted and placed on an IV nitroglycerin  drip to help lower her blood pressure.  Labetalol has also been started,  along with clonidine for elevated blood pressures.  Cardiac enzymes will  also be performed and [facility] has been contacted for a  nephrology consultation.  This case was discussed with Dr. Louis Meckel.  The patient was placed on DVT prophylaxis and GI  prophylaxis.      Jana Hakim, M.D.  Electronically Signed     HJ/MEDQ  D:  01/13/2007  T:  01/13/2007  Job:  [id number]

## 2010-12-17 NOTE — Consult Note (Signed)
NAME:  Ruth Gutierrez, Ruth Gutierrez NO.:  [id number]   MEDICAL RECORD NO.:  [id number]          PATIENT TYPE:  INP   LOCATION:  2113                         FACILITY:  [facility]   PHYSICIAN:  Louis Meckel, M.D.DATE OF BIRTH:  1966-07-28   DATE OF CONSULTATION:  DATE OF DISCHARGE:                                 CONSULTATION   RENAL CONSULTATION:   REQUESTING PHYSICIAN:  [facility].   REASON FOR REFERRAL:  Malignancy hypertension and creatinine 7.9.   HISTORY OF PRESENT ILLNESS:  Ruth Gutierrez is a 45 year old black  female with reportedly no known past medical history and has not been to  an MD.  Per the patient, she was diagnosed with hypertension in 2000 and  was given a medication.  However, she stopped that medication and has  taken any for approximately the last 2-3 years.  We do have information  from an emergency department visit in October of 2005, at which time her  creatinine level was 0.8.  There is an also an emergency department  visit from 2007, which revealed a blood pressure of 140/90.  Patient  reports, over the last couple of weeks, to a month she has had  difficulty with blurred and decreased vision and headaches.  She  presented for evaluation.  Blood pressure was found to be 227/135 and so  she was admitted for malignant hypertension.  Initial creatinine was  noted to be 7.9, so we are asked to consult.  Patient states that lately  she has noticed increased urine output.  She denies any lower extremity  edema, dysuria, gross hematuria.  She does report that she does take  occasional NSAIDs and has taken up to 4 per day, but none on a daily  basis over the last month to year.  She also reports some nausea after  meals, but denies any excessive increased fatigue or weight loss.  Of  note, her brother is Ruth Gutierrez, who is an ESRD patient.  His etiology  for ESRD is unknown.  She thinks that he had some type of kidney  disease.   PAST MEDICAL  HISTORY:  1. Diagnosis of hypertension in 2000, off meds for 2-3 years.  2. Creatinine of 0.8 in October of 2005; otherwise, past medical      history is negative.   CURRENT MEDICATIONS:  At home, she was not taking anything, except for  ibuprofen noted above.  Here is on:  1. Labetalol 200 mg b.i.d.  2. Catapres 0.1 mg p.o. q.6 hours.  3. Protonix.  4. Lovenox.  5. Nitroglycerin drip.   ALLERGIES:  NO KNOWN DRUG ALLERGIES.   SOCIAL HISTORY:  The patient does work.  She lives at home with daughter  who is age 43.  She does use tobacco and occasional alcohol, but denies  any other drugs.   FAMILY HISTORY:  Brother is on dialysis; otherwise, family history is  negative for kidney disease.   REVIEW OF SYSTEMS:  Positive for blurred vision, decreased vision, some  arm numbness, polyuria and nausea after meals.  Negative for swelling,  dysuria, hematuria, chest pain, shortness of breath, fevers,  chills.  Negative for a history of urinary tract infection or history of a kidney  stone.  She also has no joint complaints and no rashes.   PHYSICAL EXAMINATION:  VITAL SIGNS:  Patient is afebrile.  Blood  pressure, currently, is 150/90.  Heart rate is 100.  Respirations are  normal.  Oxygen saturation is 98% on room air, 460 mL of urine output  has been recorded since she has been in the unit, really for 4-5 hours.  GENERAL:  Patient is alert and oriented in no acute distress.  HEENT:  Pupils are equal, round and reactive to light.  Extraocular  motions are intact.  There is couple wiring of retinal vessels, but no  evidence of hemorrhage.  There is no palpable edema.  NECK:  There is no jugular venous distention.  No bruits.  LUNGS:  Clear to auscultation bilaterally without wheezes.  CARDIOVASCULAR:  Tachycardic without murmur, gallop or rub.  ABDOMEN:  Positive bowel sounds.  Soft, nontender, nondistended.  EXTREMITIES:  Revealed no edema.  No petechiae.  No rash.  No synovitis.    LABS:  BUN and creatinine 69 and 7.9.  Potassium 4.7.  Hemoglobin 10.9.  Troponin 0.08.  Urinalysis does show greater than 300 protein.  She did  have moderate blood on the dipstick with microscopic exam only reveals 3-  6 RBCs per high powered field.   ASSESSMENT:  A 45 year old black female with a history of hypertension  after presenting with malignant hypertension and an increased  creatinine.   1. Renal:  We know that renal function was normal in October of 2005.      History and urinalysis are not entirely consistent with acute      glomerulonephritis, but we will do serologies just to make sure.      We will check a renal ultrasound, quantify proteinuria and check      SPEP and UPEP and monitor urine output and creatinine closely.  We      will also discontinue nonsteroidal antiinflammatories with a      creatinine of 7.9.  It did have acute glomerulonephritis, course is      likely out of the forearm.  However, if secondary to malignant      hypertension, we may see some improvement with blood pressure      lowering, as long as we do not push it too low.  Patient is aware      that this may represent permanent renal failure.  There is no acute      indication for dialysis at this point in time.  We will monitor her      closely and she may need access placement prior to discharge if no      significant improvement in renal function.  2. Hypertension:  Blood pressure has decreased nicely.  Would not      lower anymore.  Would continue with goal blood pressure 150-170/90-      100 for now.  Nitroglycerin could probably be weaned soon.  3. Anemia:  Patient with a hemoglobin of 10.9.  We will check iron      stores and followup closely.  We will also check a QTH.           ______________________________  Louis Meckel, M.D.     KAG/MEDQ  D:  01/13/2007  T:  01/13/2007  Job:  [id number]

## 2010-12-17 NOTE — Discharge Summary (Signed)
NAME:  Ruth Gutierrez, Ruth Gutierrez          ACCOUNT NO.:  [id number]   MEDICAL RECORD NO.:  [id number]          PATIENT TYPE:  INP   LOCATION:  5732                         FACILITY:  [facility]   PHYSICIAN:  Durwin Nora, MDDATE OF BIRTH:  1965-11-10   DATE OF ADMISSION:  01/12/2007  DATE OF DISCHARGE:  01/18/2007                               DISCHARGE SUMMARY   ADDENDUM   DISCHARGE CONDITION:  Stable.   DIET:  Renal.   FOLLOW UP:  The patient to follow up at [facility] with the primary  care physician.  She is to follow up with nephrologist, Dr. Justin Mend, in 1-2  weeks.  She is to continue dialysis three times weekly.   DISCHARGE MEDICATIONS:  1. Nephrovite one tablet daily.  2. Aranesp 200 mEq subcu@  dialysis.  3. Prilosec 20 mg daily.  4. Hectorol 4 mg IV on Tuesday, Thursdays and Saturdays with dialysis.  5. PhosLo 667 mg, two tablets t.i.d. with meals.  6. Dextran 50 mEq IV on  dialysis.  7. Tylenol 62 mg q.6h. p.r.n.   The patient has been counseled on stopping smoking.  Activities to be  increased slowly.   PHYSICAL EXAMINATION:  VITAL SIGNS:  Temperature 99, pulse 98,  respiratory rate 20, blood pressure 134/93.  She is still depressed.  Does not complain of any nausea, pruritus or swelling.  HEART:  Sounds 1 and 2.  CHEST: Clear.  ABDOMEN:  Benign.  Bowel sounds normal active.  NEUROLOGIC:  She is alert and oriented x2.  Peripheral pulses with no  pedal edema.   being set up for outpatient dialysis.   LABORATORY DATA:  The phosphorous is 5.7, WBC is 11.6, hematocrit 26,  platelet count 128, sodium is 135, potassium 4.0, chloride 97,  bicarbonate 27, BUN 30, creatinine 5.8.   Will add Cipro 500 mg b.i.d. for 3 days and to have the CBC repeated at  the next dialysis session.      Durwin Nora, MD  Electronically Signed     MIO/MEDQ  D:  01/21/2007  T:  01/21/2007  Job:  [id number]

## 2010-12-17 NOTE — Op Note (Signed)
NAME:  EARNESTEEN, AMENT NO.:  [id number]   MEDICAL RECORD NO.:  [id number]          PATIENT TYPE:  INP   LOCATION:  5732                         FACILITY:  [facility]   PHYSICIAN:  Jessy Oto. Fields, MD  DATE OF BIRTH:  1965-12-05   DATE OF PROCEDURE:  01/18/2007  DATE OF DISCHARGE:                               OPERATIVE REPORT   PROCEDURE:  Left brachiocephalic AV fistula.   PREOPERATIVE DIAGNOSIS:  Renal failure.   POSTOPERATIVE DIAGNOSIS:  Renal failure.   ANESTHESIA:  Local with IV sedation.   ASSISTANT:  Nurse.   OPERATIVE FINDING:  2.5 to 3 mm cephalic vein.   OPERATIVE DETAILS:  After obtaining informed consent, the patient taken  to the operating.  The patient placed in supine position on the  operating room table.  After adequate sedation, the patient's entire  left upper extremity was prepped and draped in the usual sterile  fashion.  Local anesthesia was infiltrated near the antecubital crease.  A transverse incision was made in this location, carried down through  the subcutaneous tissues down to the level of the cephalic vein.  Cephalic vein was approximately 2.5-3 mm in diameter.  It was dissected  free circumferentially and small side branches ligated and divided  between silk ties.  Next, brachial artery was dissected free in the  medial portion of the incision.  This was controlled proximally and  distally with Vesseloops.  The patient was then given 5000 units of  intravenous heparin.  Vesseloops were then pulled taut.  The distal  cephalic vein was ligated with 3-0 silk tie and swung over to the level  of the brachial artery.  It was gently distended and thoroughly flushed  with heparinized saline.  A longitudinal arteriotomy was made in the  brachial artery and the vein sewn end of vein to side of artery using a  running 7-0 Prolene suture.  Just prior to completion of anastomosis,  this was forebled, back-bled and thoroughly flushed.   Anastomosis was  secured, clamps were released, fistula filled immediately.  There was a  faint thrill.  It was difficult to feel this in the upper portion of the  arm but there was good Doppler flow through this and there was some  spasm in the brachial artery.  Hemostasis was obtained.  Subcutaneous  tissues reapproximated using running 3-0 Vicryl suture.  Skin was closed  with 4-0 Vicryl subcuticular stitch.  The patient tolerate procedure  well and there were no complications.  Instrument, sponge and needle  count was correct at the end of the case.  The patient had palpable  radial pulse at the end of the case.  The patient taken to the recovery  room in stable condition.      Jessy Oto. Fields, MD  Electronically Signed     CEF/MEDQ  D:  01/21/2007  T:  01/21/2007  Job:  [id number]

## 2010-12-17 NOTE — Op Note (Signed)
NAME:  Ruth Gutierrez, Ruth Gutierrez NO.:  [id number]   MEDICAL RECORD NO.:  [id number]          PATIENT TYPE:  INP   LOCATION:  [id number]                         FACILITY:  [facility]   PHYSICIAN:  Nelda Severe. Kellie Simmering, M.D.  DATE OF BIRTH:  Nov 08, 1965   DATE OF PROCEDURE:  01/17/2007  DATE OF DISCHARGE:                               OPERATIVE REPORT   PREOPERATIVE DIAGNOSIS:  End-stage renal disease.   POSTOPERATIVE DIAGNOSIS:  End-stage renal disease.   OPERATION:  1. Bilateral ultrasound and localization of internal jugular veins.  2. Insertion of Diatek catheter via right internal jugular vein.   SURGEON:  Nelda Severe. Kellie Simmering, M.D.   FIRST ASSISTANT:  Nurse.   ANESTHESIA:  Local.   PROCEDURE:  The patient was taken to the operating room and placed in  the supine position at which time the upper chest and neck were exposed.  Both internal jugular veins were then imaged using B-mode ultrasound.  Both were noted to be widely patent and normal in appearance.  After  prepping and draping in routine sterile manner, the right internal  jugular vein was entered using a supraclavicular approach.  A guidewire  was passed into the right atrium under fluoroscopic guidance.  After  dilating the tract appropriately, a 24-cm Diatek catheter was positioned  in the right atrium, tunneled peripherally, and secured with nylon  sutures.  The wound was closed with Vicryl in a subcuticular fashion.  A  sterile dressing was applied.  The patient was taken to the recovery  room in satisfactory condition.      Nelda Severe Kellie Simmering, M.D.  Electronically Signed     JDL/MEDQ  D:  01/17/2007  T:  01/17/2007  Job:  [id number]

## 2010-12-17 NOTE — Op Note (Signed)
NAME:  Ruth Gutierrez, Ruth Gutierrez          ACCOUNT NO.:  [id number]   MEDICAL RECORD NO.:  [id number]          PATIENT TYPE:  OUT   LOCATION:  MDC                          FACILITY:  [facility]   PHYSICIAN:  Theotis Burrow IV, MDDATE OF BIRTH:  1965-10-16   DATE OF PROCEDURE:  05/25/2007  DATE OF DISCHARGE:                               OPERATIVE REPORT   PREOPERATIVE DIAGNOSIS:  End stage renal disease.   POSTOPERATIVE DIAGNOSIS:  End stage renal disease.   PROCEDURE PERFORMED:  Removal of right internal jugular Diatek catheter.   SPECIMENS:  Catheter tip for culture.   INDICATIONS:  A 45 year old female with end stage renal disease  currently dialyzing through a left arm fistula.  There was concern for  infection and catheter has been requested to be removed.  Risks and  benefits were discussed with the patient.  Informed consent was signed.   PROCEDURE:  Lidocaine 1% was used for local anesthesia.  The patient was  then prepped and draped in the usual sterile manner.  A combination of  blunt and sharp dissection were used to expose the cuff of the catheter.  The fibrinous attachments to the cuff were dissected sharply.  With  this, the cuff was fully mobilized and the catheter was removed.  Manual  pressure was held until hemostasis was achieved.  Catheter tip was sent  for culture.  The patient tolerated the procedure well.  There were no  immediate complications.      Eldridge Abrahams, MD  Electronically Signed     VWB/MEDQ  D:  05/26/2007  T:  05/26/2007  Job:  [id number]

## 2011-01-29 ENCOUNTER — Encounter (HOSPITAL_COMMUNITY): Payer: Medicare Other | Attending: Nephrology

## 2011-01-29 DIAGNOSIS — D509 Iron deficiency anemia, unspecified: Secondary | ICD-10-CM | POA: Insufficient documentation

## 2011-02-04 ENCOUNTER — Encounter (HOSPITAL_COMMUNITY): Payer: Medicare Other | Attending: Nephrology

## 2011-02-04 DIAGNOSIS — D509 Iron deficiency anemia, unspecified: Secondary | ICD-10-CM | POA: Insufficient documentation

## 2011-03-09 ENCOUNTER — Inpatient Hospital Stay (INDEPENDENT_AMBULATORY_CARE_PROVIDER_SITE_OTHER)
Admission: RE | Admit: 2011-03-09 | Discharge: 2011-03-09 | Disposition: A | Discharge status: Home / Self Care | Payer: Medicare Other | Source: Ambulatory Visit | Attending: Family Medicine | Admitting: Family Medicine

## 2011-03-09 ENCOUNTER — Emergency Department (HOSPITAL_COMMUNITY): Payer: Medicare Other

## 2011-03-09 ENCOUNTER — Encounter: Payer: Self-pay | Admitting: Ophthalmology

## 2011-03-09 ENCOUNTER — Inpatient Hospital Stay (HOSPITAL_COMMUNITY)
Admission: EM | Admit: 2011-03-09 | Discharge: 2011-03-11 | DRG: 315 | Disposition: A | Discharge status: Home / Self Care | Payer: Medicare Other | Attending: Internal Medicine | Admitting: Internal Medicine

## 2011-03-09 DIAGNOSIS — Y832 Surgical operation with anastomosis, bypass or graft as the cause of abnormal reaction of the patient, or of later complication, without mention of misadventure at the time of the procedure: Secondary | ICD-10-CM | POA: Diagnosis present

## 2011-03-09 DIAGNOSIS — I10 Essential (primary) hypertension: Secondary | ICD-10-CM | POA: Diagnosis present

## 2011-03-09 DIAGNOSIS — T82898A Other specified complication of vascular prosthetic devices, implants and grafts, initial encounter: Principal | ICD-10-CM | POA: Diagnosis present

## 2011-03-09 DIAGNOSIS — N2581 Secondary hyperparathyroidism of renal origin: Secondary | ICD-10-CM | POA: Diagnosis present

## 2011-03-09 DIAGNOSIS — IMO0002 Reserved for concepts with insufficient information to code with codable children: Secondary | ICD-10-CM

## 2011-03-09 DIAGNOSIS — E876 Hypokalemia: Secondary | ICD-10-CM | POA: Diagnosis present

## 2011-03-09 DIAGNOSIS — R509 Fever, unspecified: Secondary | ICD-10-CM | POA: Diagnosis present

## 2011-03-09 DIAGNOSIS — N920 Excessive and frequent menstruation with regular cycle: Secondary | ICD-10-CM | POA: Diagnosis present

## 2011-03-09 DIAGNOSIS — D509 Iron deficiency anemia, unspecified: Secondary | ICD-10-CM | POA: Diagnosis present

## 2011-03-09 DIAGNOSIS — Z7982 Long term (current) use of aspirin: Secondary | ICD-10-CM

## 2011-03-09 DIAGNOSIS — F17200 Nicotine dependence, unspecified, uncomplicated: Secondary | ICD-10-CM | POA: Diagnosis present

## 2011-03-09 DIAGNOSIS — Z940 Kidney transplant status: Secondary | ICD-10-CM

## 2011-03-09 DIAGNOSIS — Z79899 Other long term (current) drug therapy: Secondary | ICD-10-CM

## 2011-03-09 LAB — DIFFERENTIAL
Basophils Absolute: 0 10*3/uL (ref 0.0–0.1)
Eosinophils Relative: 0 % (ref 0–5)
Lymphocytes Relative: 18 % (ref 12–46)
Monocytes Absolute: 1.6 10*3/uL — ABNORMAL HIGH (ref 0.1–1.0)
Neutrophils Relative %: 70 % (ref 43–77)

## 2011-03-09 LAB — BASIC METABOLIC PANEL
Calcium: 9.3 mg/dL (ref 8.4–10.5)
Creatinine, Ser: 1 mg/dL (ref 0.50–1.10)
GFR calc Af Amer: >60 mL/min (ref >60)
GFR calc non Af Amer: >60 mL/min (ref >60)
Glucose, Bld: 104 mg/dL — ABNORMAL HIGH (ref 70–99)

## 2011-03-09 LAB — CBC
MCH: 27.9 pg (ref 26.0–34.0)
MCHC: 33.2 g/dL (ref 30.0–36.0)
MCV: 84 fL (ref 78.0–100.0)
RDW: 21.5 % — ABNORMAL HIGH (ref 11.5–15.5)

## 2011-03-09 LAB — OCCULT BLOOD, POC DEVICE: Fecal Occult Bld: NEGATIVE

## 2011-03-09 IMAGING — CR DG CHEST 2V
2 series · 2 of 2 positions shown · non-contrast
Comparison: [DATE]

CLINICAL DATA: Fever, left arm pain

CHEST - 2 VIEW

[w chest pa]
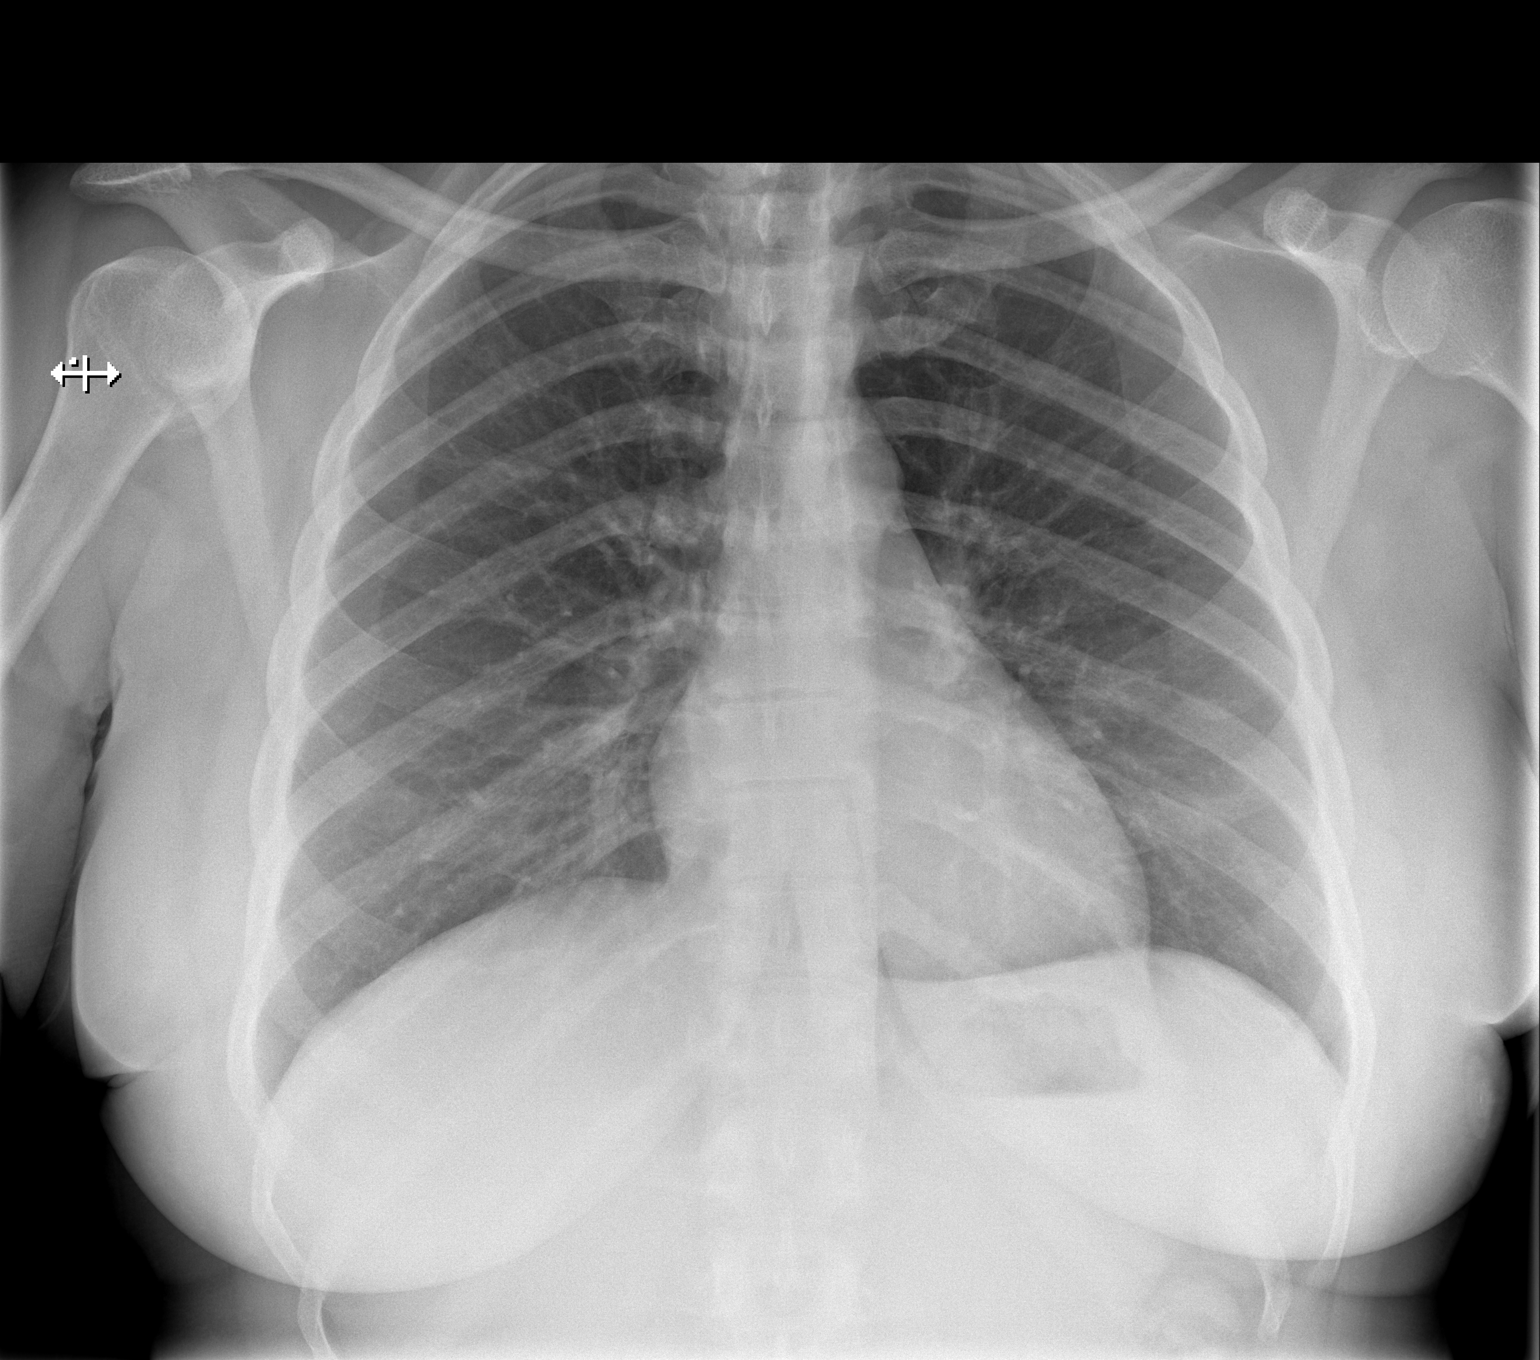

[w chest lat]
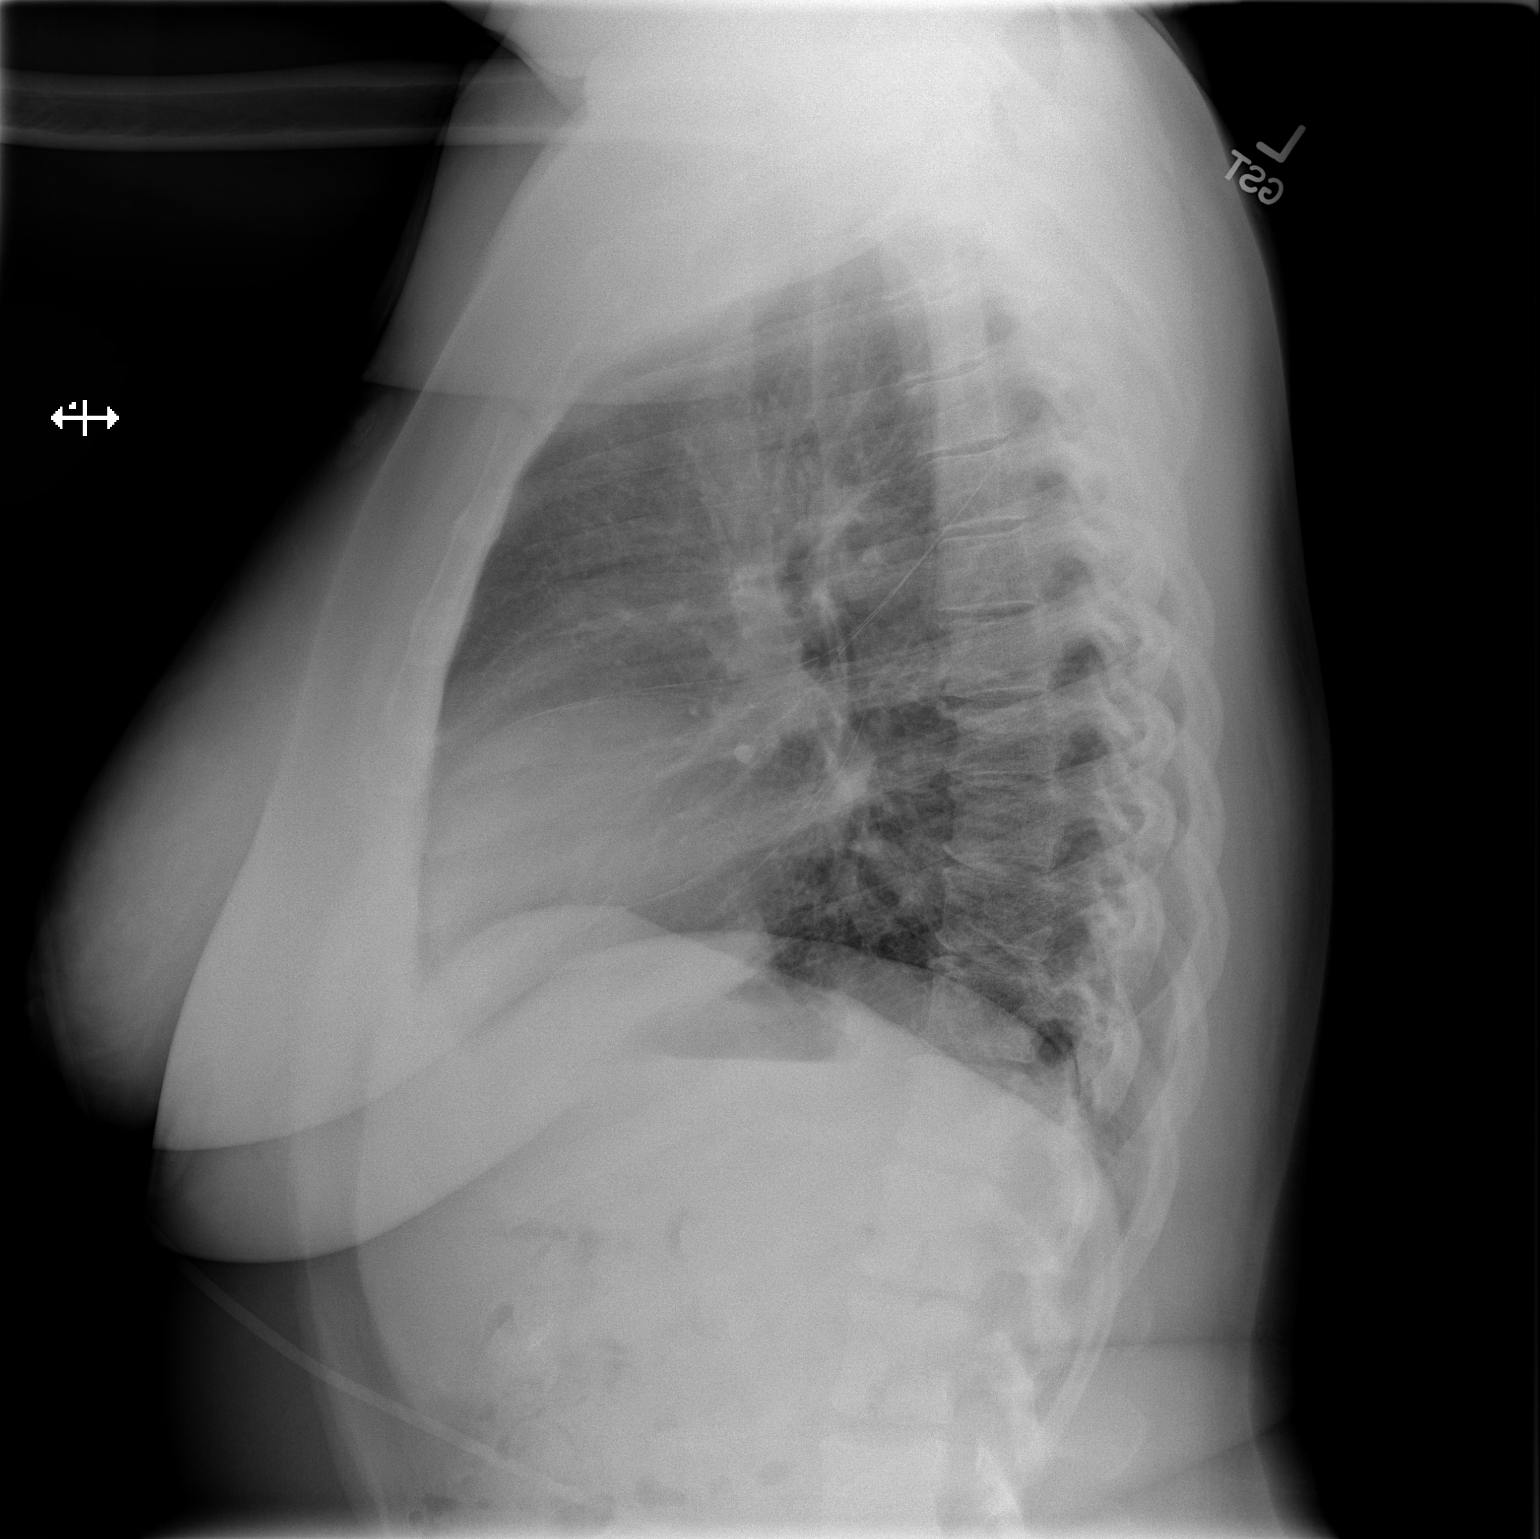

[2 of 2 positions shown; findings below may reference images not displayed]

FINDINGS: Lungs are clear. No pleural effusion or pneumothorax.

Cardiomediastinal silhouette is within normal limits.

Mild degenerative changes of the visualized thoracolumbar spine.
IMPRESSION: Normal chest radiographs.

## 2011-03-09 NOTE — H&P (Signed)
Hospital Admission Note Date: 03/10/2011  Patient name: Ruth Gutierrez Medical record number: [id number] Date of birth: [birth year] Age: 45 y.o. Gender: female PCP: No primary provider on file.  Medical Service: Internal Medicine Teaching Service  Attending physician:  Dr. Marinda Elk    Resident (R2/R3): Dr. Vanessa Kick   Pager: [phone] Resident (R1): Dr. Dorthula Nettles  Pager: [phone]  Chief Complaint: L upper arm swelling  History of Present Illness: Patient is a 45 year old woman with a history of ESRD s/p cadaveric kidney transplant who presents with pain and swelling of her left upper arm that began about one week ago. She also reports that she has had chills and a fever since yesterday that increased from 100.2 to 101.0 today. She noticed that the thrill disappeared in her AVM in the left antecubital fossa and upper arm 2-3 days ago. She denies any cuts, scrapes to the arm. She also reports increased sleepiness, anorexia, dizziness and headache for the past few days. She has been eating very little food though taking in a good amount of fluids. She denies any history of peripheral blood clots. On ROS, she reports constipation and some vaginal discharge. She denies chest pain, SOB, nausea/vomiting, diarrhea.  She also reports a history of an unusually heavy menstrual period that finished yesterday. She reports having to change her pad 3 times an hour. She reports passing clots of blood.  Meds: Prograf 8mg  BID Cellcept 100mg  BID Amlodipine besylate 10mg  Furosemide 40mg  Atenolol 50mg  Aspirin 81mg  Benadryl 25mg  PRN Colace as needed  Allergies: Review of patient's allergies indicates no known allergies.  Past Medical History  Diagnosis Date  . Deceased-donor kidney transplant     April 2010  . Anemia of chronic disease   . ESRD (end stage renal disease)     s/p transplant creatinine baseline 1.1  . History of hyperparathyroidism   . Hypertension    Past Surgical History    Procedure Date  . Kidney transplant 11/2008    Cadaveric  . Av fistula placement    Family History  Problem Relation Age of Onset  . Diabetes    . Hypertension     History   Social History  . Marital Status: Single    Spouse Name: N/A    Number of Children: N/A  . Years of Education: N/A   Occupational History  . Not on file.   Social History Main Topics  . Smoking status: Current Everyday Smoker -- 0.5 packs/day for 27 years    Types: Cigarettes  . Smokeless tobacco: Not on file  . Alcohol Use: Yes  . Drug Use: No  . Sexually Active: Not on file   Social History Narrative   Lives alone, not working, sister helps her with medications etc.   Review of Systems: As per HPI.  Physical Exam: Vitals: Tm: 101.1  HR: 88  BP: 95/57  RR: 18  O2 saturation: 96% on RA General: obese middle aged woman resting in bed, she appear in no distress, somewhat distracted during interview due to speaking on cell phone via headset HEENT: PERRL, EOMI, no scleral icterus Cardiac: RRR, no rubs, murmurs or gallops Pulm: faint rhonchi bilaterally, moving normal volumes of air Abd: obese, nontender, nondistended, BS present Ext: warm and well perfused, no pedal edema. LUE: edematous and warm from antecubital fossa up to underarm. Slight darkening of skin on inner surface of arm. No erythema. Tortuous AV fistula present in antecubital fossa with two incision site present superiorly.  Neuro: alert and  oriented X3, cranial nerves II-XII grossly intact, strength and sensation to light touch equal in bilateral upper and lower extremities  Lab results: Admission on 03/09/2011  Component Date Value Range Status  . Neutrophils Relative (%) 03/09/2011 70  43-77 Final  . Neutro Abs (K/uL) 03/09/2011 9.5* 1.7-7.7 Final  . Lymphocytes Relative (%) 03/09/2011 18  12-46 Final  . Lymphs Abs (K/uL) 03/09/2011 2.5  0.7-4.0 Final  . Monocytes Relative (%) 03/09/2011 11  3-12 Final  . Monocytes Absolute (K/uL)  03/09/2011 1.6* 0.1-1.0 Final  . Eosinophils Relative (%) 03/09/2011 0  0-5 Final  . Eosinophils Absolute (K/uL) 03/09/2011 0.1  0.0-0.7 Final  . Basophils Relative (%) 03/09/2011 0  0-1 Final  . Basophils Absolute (K/uL) 03/09/2011 0.0  0.0-0.1 Final  . WBC (K/uL) 03/09/2011 13.6* 4.0-10.5 Final  . RBC (MIL/uL) 03/09/2011 2.19* 3.87-5.11 Final  . Hemoglobin (g/dL) 03/09/2011 6.1* 12.0-15.0 Final  . HCT (%) 03/09/2011 18.4* 36.0-46.0 Final  . MCV (fL) 03/09/2011 84.0  78.0-100.0 Final  . MCH (pg) 03/09/2011 27.9  26.0-34.0 Final  . MCHC (g/dL) 03/09/2011 33.2  30.0-36.0 Final  . RDW (%) 03/09/2011 21.5* 11.5-15.5 Final  . Platelets (K/uL) 03/09/2011 317  150-400 Final  . Fecal Occult Bld  03/09/2011 NEGATIVE   Final  . Sodium (mEq/L) 03/09/2011 135  135-145 Final  . Potassium (mEq/L) 03/09/2011 2.8* 3.5-5.1 Final  . Chloride (mEq/L) 03/09/2011 98  96-112 Final  . CO2 (mEq/L) 03/09/2011 26  19-32 Final  . Glucose, Bld (mg/dL) 03/09/2011 104* 70-99 Final  . BUN (mg/dL) 03/09/2011 6  6-23 Final  . Creatinine, Ser (mg/dL) 03/09/2011 1.00  0.50-1.10 Final  . Calcium (mg/dL) 03/09/2011 9.3  8.4-10.5 Final  . GFR calc non Af Amer (mL/min) 03/09/2011 >60  >60 Final  . GFR calc Af Amer (mL/min) 03/09/2011 >60  >60 Final  . ABO/RH(D)  03/09/2011 A POS   Final  . Antibody Screen  03/09/2011 NEG   Final  . Sample Expiration  03/09/2011 03/12/2011   Final   Imaging results:  CHR: normal chest radiograph Left Upper extremity doppler pending  Other results: EKG: pending  Assessment & Plan by Problem: ASSESSMENT: 1. L Upper extremity pain due to *Cellulitis vs *DVTvs * infected AV fistula (unlikely). 2. Acute normocytic anemia. Patient has a Hx of anemia of chronic disease with baseline Hgb at 11. Differential Dx include * due to menorrhagia/DUB * Iron deficiency due to poor diet * GI bleed is unlikely given negative stool guaiac. * Renal disease is unlikely given normal renal  function 3. Hypotension. Likely due to volume contraction and possible sepsis.  4. Hx of secondary hypoparathyroidism. Patient's recent vit D and calcium levels WNL; patient is being followed by Dr. Jimmy Footman.  PLAN: -Admit to 6700 telemetry floor with a low threshold for a transfer to SDU. -Venus and arterial Duplex STAT. -Pelvic and transvaginal US to evaluate menorrhagia and pelvic pain. -CXR (PA/Lateral) and EKG -Blood cx x 2 -Vancomycin IV -UA, micro, UDS, urine GC/Chlamydia. -Ferritin, Iron, vit B12 and folate levels, peripheral smear -patient refused blood transfusion, type and screen 2 units PRBCs. -Vicodin PO q 4-6 hr PRN pain/fever -hold Amlodipine, atenolol and lasix due to hypotension. NS at 125 cc/hr IV; daily standing weights and strict I/O's -CBC, BMP, EKG in AM -SCD's   R2/3______________________________      R1________________________________  ATTENDING: I performed and/or observed a history and physical examination of the patient.  I discussed the case with the residents as noted and reviewed  the residents' notes.  I agree with the findings and plan--please refer to the attending physician note for more details.  Signature________________________________  Printed Name_____________________________

## 2011-03-10 ENCOUNTER — Inpatient Hospital Stay (HOSPITAL_COMMUNITY): Payer: Medicare Other

## 2011-03-10 ENCOUNTER — Encounter: Payer: Self-pay | Admitting: Ophthalmology

## 2011-03-10 DIAGNOSIS — I120 Hypertensive chronic kidney disease with stage 5 chronic kidney disease or end stage renal disease: Secondary | ICD-10-CM

## 2011-03-10 DIAGNOSIS — T82898A Other specified complication of vascular prosthetic devices, implants and grafts, initial encounter: Secondary | ICD-10-CM

## 2011-03-10 DIAGNOSIS — M79609 Pain in unspecified limb: Secondary | ICD-10-CM

## 2011-03-10 DIAGNOSIS — IMO0002 Reserved for concepts with insufficient information to code with codable children: Secondary | ICD-10-CM

## 2011-03-10 DIAGNOSIS — N186 End stage renal disease: Secondary | ICD-10-CM

## 2011-03-10 DIAGNOSIS — M7989 Other specified soft tissue disorders: Secondary | ICD-10-CM

## 2011-03-10 LAB — CBC
HCT: 24.9 % — ABNORMAL LOW (ref 36.0–46.0)
Hemoglobin: 8.1 g/dL — ABNORMAL LOW (ref 12.0–15.0)
MCHC: 32.5 g/dL (ref 30.0–36.0)
MCHC: 32.1 g/dL (ref 30.0–36.0)
MCV: 84.7 fL (ref 78.0–100.0)
RDW: 21.2 % — ABNORMAL HIGH (ref 11.5–15.5)
RDW: 20.6 % — ABNORMAL HIGH (ref 11.5–15.5)
WBC: 10.8 10*3/uL — ABNORMAL HIGH (ref 4.0–10.5)

## 2011-03-10 LAB — HEPATIC FUNCTION PANEL
AST: 14 U/L (ref 0–37)
Bilirubin, Direct: 0.1 mg/dL (ref 0.0–0.3)
Indirect Bilirubin: 0.1 mg/dL — ABNORMAL LOW (ref 0.3–0.9)
Total Bilirubin: 0.2 mg/dL — ABNORMAL LOW (ref 0.3–1.2)

## 2011-03-10 LAB — PREGNANCY, URINE: Preg Test, Ur: NEGATIVE

## 2011-03-10 LAB — TYPE AND SCREEN
Antibody Screen: NEGATIVE
Unit division: 0
Unit division: 0

## 2011-03-10 LAB — BASIC METABOLIC PANEL
BUN: 7 mg/dL (ref 6–23)
Chloride: 107 mEq/L (ref 96–112)
Creatinine, Ser: 0.88 mg/dL (ref 0.50–1.10)
GFR calc non Af Amer: >60 mL/min (ref >60)
Glucose, Bld: 97 mg/dL (ref 70–99)
Potassium: 2.9 mEq/L — ABNORMAL LOW (ref 3.5–5.1)

## 2011-03-10 LAB — RAPID URINE DRUG SCREEN, HOSP PERFORMED
Amphetamines: NOT DETECTED
Barbiturates: NOT DETECTED
Benzodiazepines: NOT DETECTED
Cocaine: NOT DETECTED

## 2011-03-10 LAB — RETICULOCYTES: Retic Count, Absolute: 111.7 10*3/uL (ref 19.0–186.0)

## 2011-03-10 LAB — HIV ANTIBODY (ROUTINE TESTING W REFLEX): HIV: NONREACTIVE

## 2011-03-10 LAB — PROTIME-INR
INR: 1.35 (ref 0.00–1.49)
Prothrombin Time: 16.9 seconds — ABNORMAL HIGH (ref 11.6–15.2)

## 2011-03-10 LAB — URINALYSIS, MICROSCOPIC ONLY
Bilirubin Urine: NEGATIVE
Ketones, ur: NEGATIVE mg/dL
Leukocytes, UA: NEGATIVE
Nitrite: NEGATIVE
Protein, ur: NEGATIVE mg/dL
Urobilinogen, UA: 0.2 mg/dL (ref 0.0–1.0)

## 2011-03-10 LAB — PREPARE RBC (CROSSMATCH)

## 2011-03-10 LAB — FOLATE
Folate: 7.5 ng/mL
Folate: 10.2 ng/mL

## 2011-03-10 LAB — FERRITIN: Ferritin: 33 ng/mL (ref 10–291)

## 2011-03-10 LAB — POCT PREGNANCY, URINE: Preg Test, Ur: NEGATIVE

## 2011-03-10 LAB — IRON: Iron: <10 ug/dL — ABNORMAL LOW (ref 42–135)

## 2011-03-10 LAB — APTT: aPTT: 39 seconds — ABNORMAL HIGH (ref 24–37)

## 2011-03-10 LAB — LACTATE DEHYDROGENASE: LDH: 403 U/L — ABNORMAL HIGH (ref 94–250)

## 2011-03-10 LAB — VITAMIN B12: Vitamin B-12: 519 pg/mL (ref 211–911)

## 2011-03-10 IMAGING — US US PELVIS COMPLETE
1 series · 14 of 25 positions shown · non-contrast
Comparison: None.

CLINICAL DATA: Dysfunctional uterine bleeding

TRANSABDOMINAL AND TRANSVAGINAL ULTRASOUND OF PELVIS
TECHNIQUE: Both transabdominal and transvaginal ultrasound
examinations of the pelvis were performed. Transabdominal technique
was performed for global imaging of the pelvis including uterus,
ovaries, adnexal regions, and pelvic cul-de-sac.

[Series 1: us pelvis complete · 0.30mm/px · 14 of 43 slices shown]
[im 1/43]
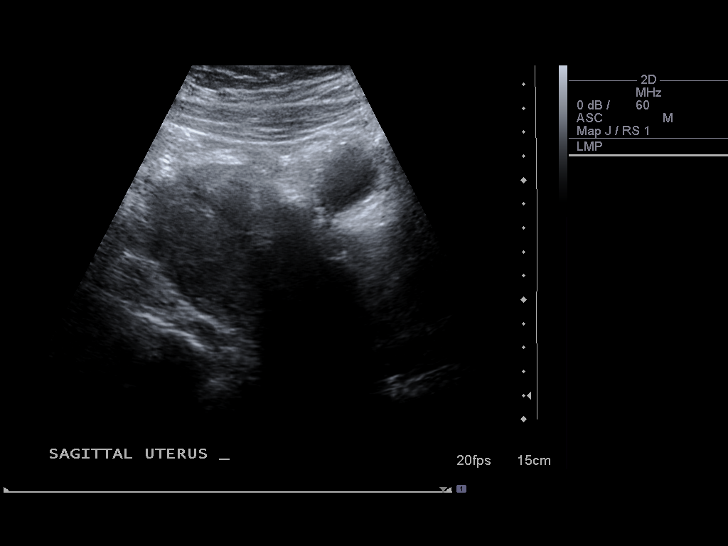
[im 4/43]
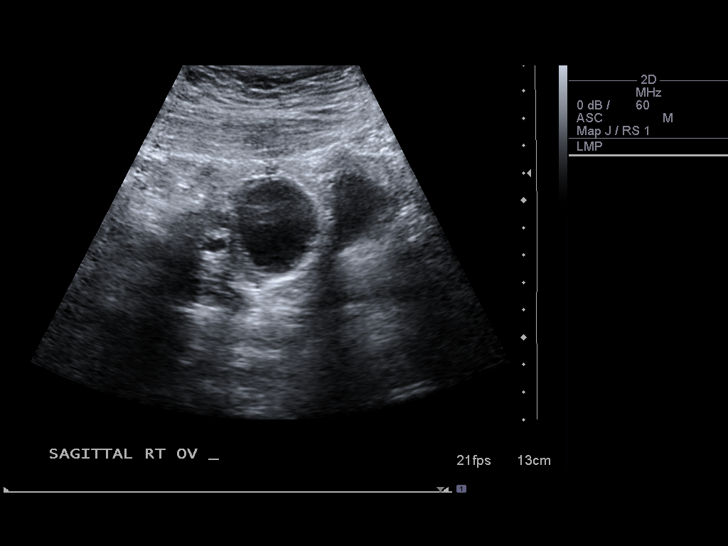
[im 8/43]
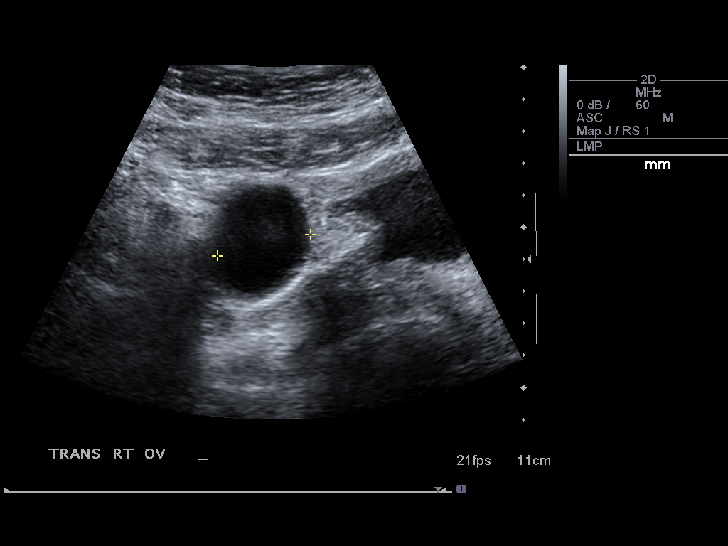
[im 11/43]
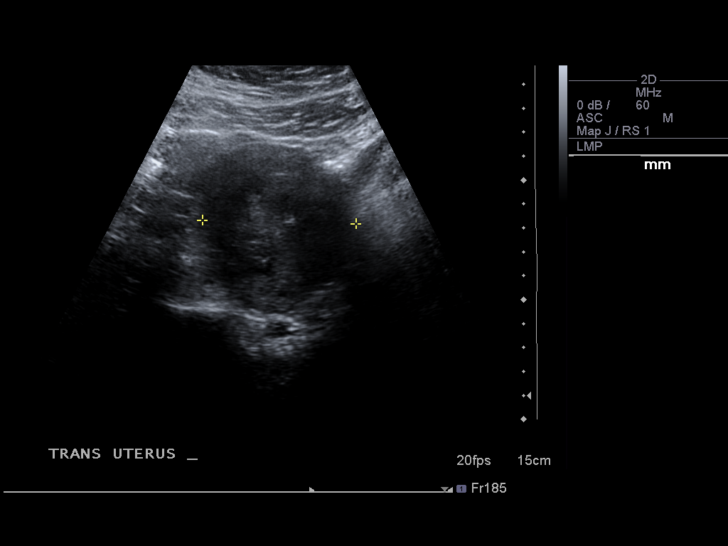
[im 15/43]
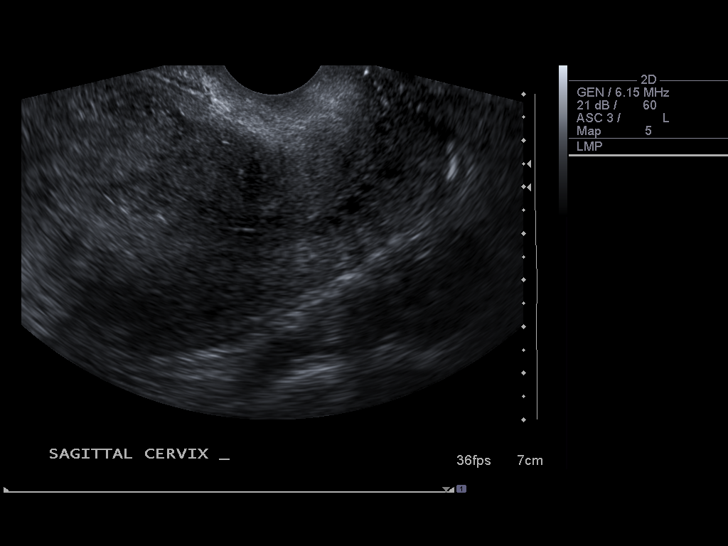
[im 16/43]
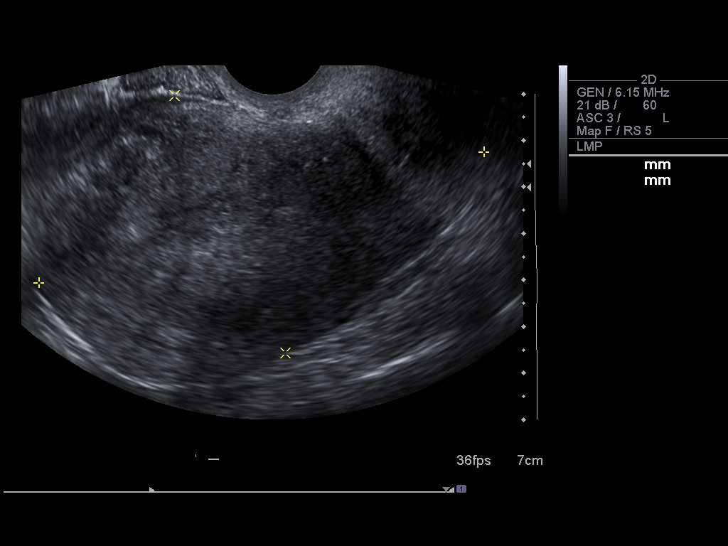
[im 20/43]
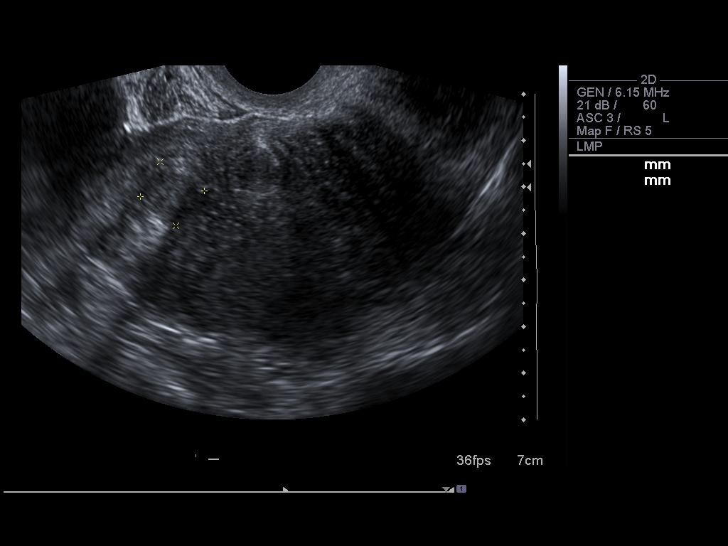
[im 23/43]
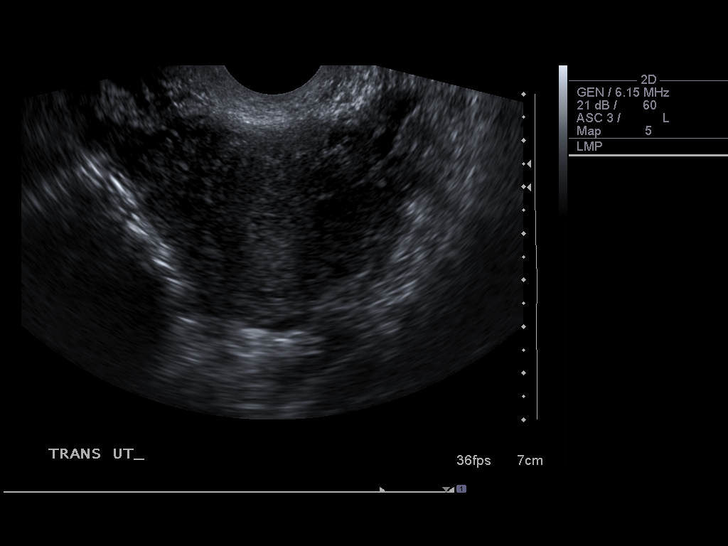
[im 27/43]
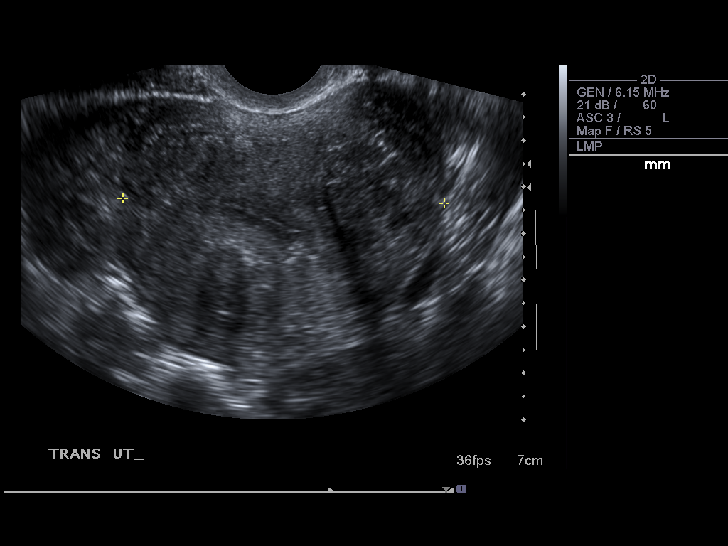
[im 29/43]
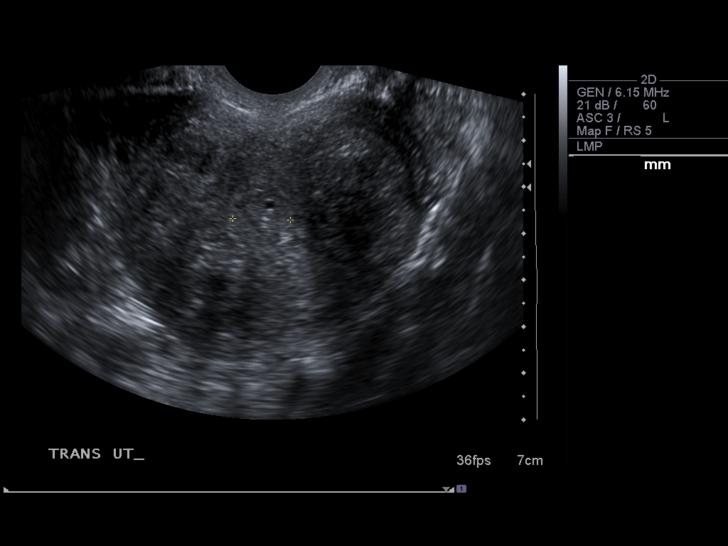
[im 32/43]
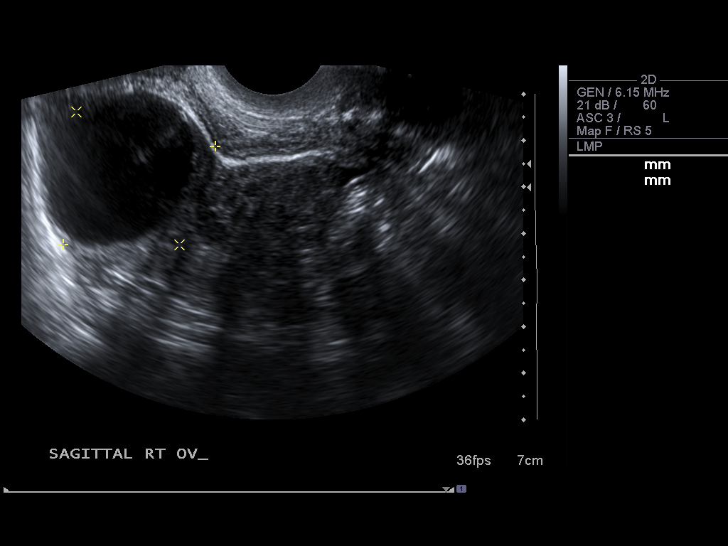
[im 36/43]
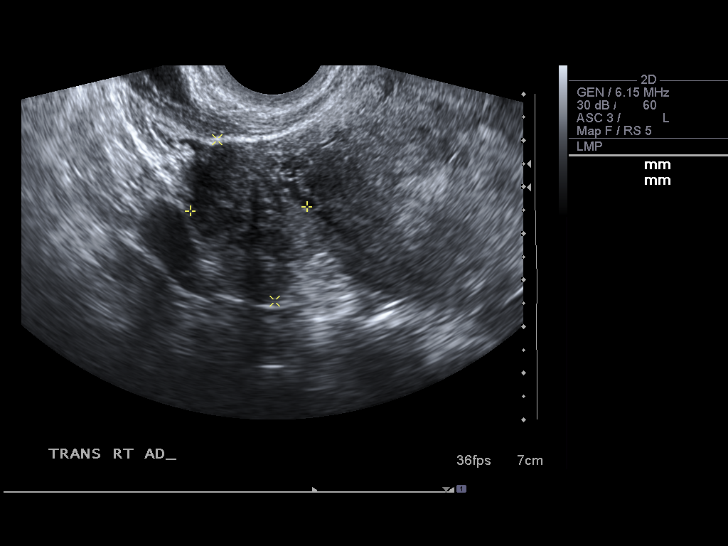
[im 39/43]
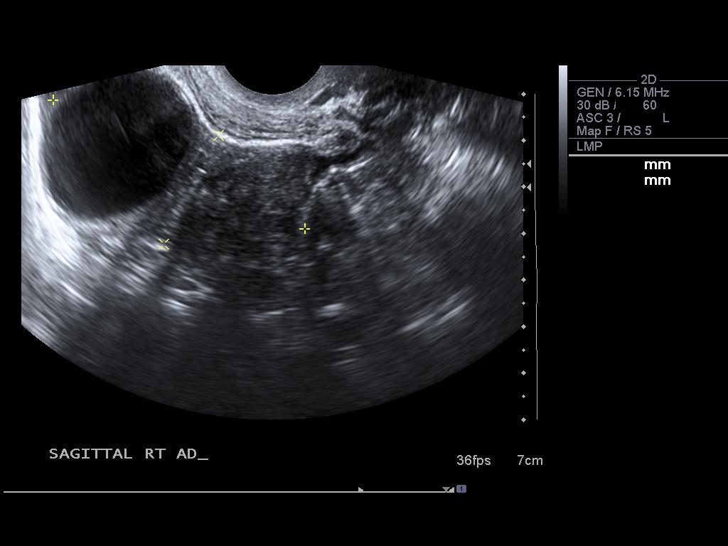
[im 43/43]
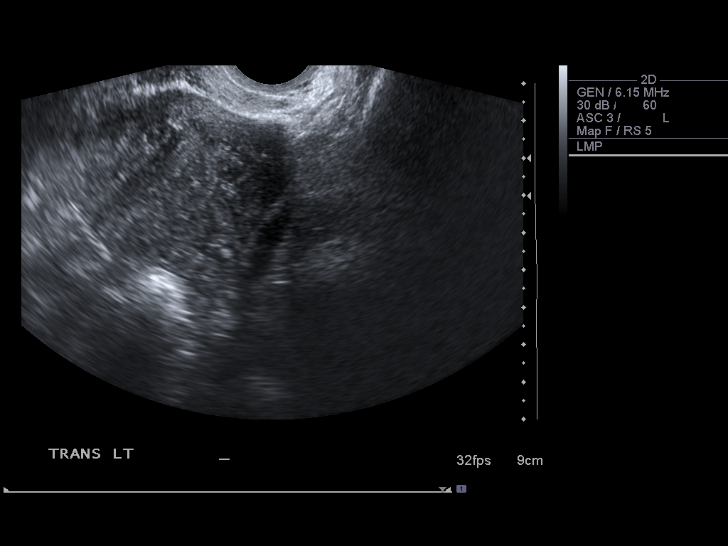

[14 of 25 positions shown; findings below may reference images not displayed]

It was necessary to proceed with endovaginal exam following the
transabdominal exam to visualize the endometrium.
FINDINGS: The uterus is enlarged and lobular, measuring 10.0 x 6.0 x 7.2 cm.
Multiple myometrial fibroids are identified.  The largest is at the
posterior fundus, measuring 2.8 cm.  The overall myometrial
echotexture is heterogeneous which suggests adenomyosis, as well.
The endometrial stripe is thin and homogeneous, measuring 2 mm in
width.

The left ovary is not identified.  The right ovary measures 6.1 x
3.0 x 2.6 centimeters and contains a 3.6 cm simple cyst which is
within normal limits for a patient this age.  There is no free
pelvic fluid.
IMPRESSION: Fibroid uterus.  Adenomyosis is also suspected.

## 2011-03-11 LAB — BASIC METABOLIC PANEL
BUN: 7 mg/dL (ref 6–23)
Calcium: 9 mg/dL (ref 8.4–10.5)
Creatinine, Ser: 0.8 mg/dL (ref 0.50–1.10)
GFR calc non Af Amer: >60 mL/min (ref >60)
Glucose, Bld: 112 mg/dL — ABNORMAL HIGH (ref 70–99)
Sodium: 140 mEq/L (ref 135–145)

## 2011-03-11 LAB — URINALYSIS, ROUTINE W REFLEX MICROSCOPIC
Bilirubin Urine: NEGATIVE
Glucose, UA: NEGATIVE mg/dL
Hgb urine dipstick: NEGATIVE
Protein, ur: NEGATIVE mg/dL
Urobilinogen, UA: 0.2 mg/dL (ref 0.0–1.0)

## 2011-03-11 LAB — CBC
HCT: 24.3 % — ABNORMAL LOW (ref 36.0–46.0)
MCH: 27.7 pg (ref 26.0–34.0)
MCV: 84.1 fL (ref 78.0–100.0)
Platelets: 287 10*3/uL (ref 150–400)
RBC: 2.89 MIL/uL — ABNORMAL LOW (ref 3.87–5.11)

## 2011-03-11 LAB — HAPTOGLOBIN: Haptoglobin: 279 mg/dL — ABNORMAL HIGH (ref 30–200)

## 2011-03-12 LAB — URINE CULTURE: Colony Count: 3000

## 2011-03-14 LAB — CYTOMEGALOVIRUS PCR, QUALITATIVE

## 2011-03-16 LAB — CULTURE, BLOOD (ROUTINE X 2)
Culture  Setup Time: 201208060147
Culture: NO GROWTH

## 2011-03-16 NOTE — Consult Note (Signed)
NAME:  Ruth Gutierrez, Ruth Gutierrez NO.:  [id number]  MEDICAL RECORD NO.:  [id number]  LOCATION:  [facility]                         FACILITY:  [facility]  PHYSICIAN:  Conrad Gray, MD       DATE OF BIRTH:  28-Jul-1966  DATE OF CONSULTATION:  03/10/2011 DATE OF DISCHARGE:                                CONSULTATION   REASON FOR CONSULTATION:  Thrombosed left arm fistula.  This is requested by Internal Medicine Teaching Service, Dr. Marinda Elk.  HISTORY OF PRESENT ILLNESS:  This is a 45 year old patient status post a cadaveric kidney transplant that presents with chief complaint of pain and swelling of her left upper arm.  This started about a week prior to presentation on March 10, 2011.  On the date of presentation, she also had fever and chills.  She notes she has not been using this left brachiocephalic arteriovenous fistula since successful placement of her transplant about 2 years ago per the patient.  She recently noticed that the thrill disappeared about 2-3 days prior to presentation.  She does not note any neurologic symptoms in the left arm and the swelling is not substantially different from about 1 week ago.  She denies any trauma to the left arm or any changes in the left arm prior to it thrombosing. She notes also heavy menstrual period that finished about 1 day prior to presentation.  PAST MEDICAL HISTORY: 1. Cadaveric kidney transplant in April 2010. 2. Anemia of chronic disease. 3. History of hyperparathyroidism. 4. Hypertension.  PAST SURGICAL HISTORY:  Cadaveric kidney transplant, she had a left brachiocephalic arteriovenous fistula placed that was completed on January 18, 2007.  She has also had tunneled dialysis catheter placed previously in her right internal jugular vein that was removed later in October 2008.  SOCIAL HISTORY:  She smokes about half a pack a day for about 30 years. Alcohol is social use.  Denies illicit drug use.  FAMILY HISTORY:  Significant  for diabetes and hypertension.  REVIEW OF SYSTEMS:  As listed above, otherwise noted to be negative.  MEDICATIONS:  She is on Prograf, CellCept, amlodipine, Lasix, atenolol, aspirin, Benadryl, and Colace.  ALLERGIES:  She has no known drug allergies.  PHYSICAL EXAMINATION:  VITAL SIGNS:  She when I saw her had a temperature of 99.6 with blood pressure 115/75, heart rate of 92, respirations 21, saturating 100% on room air; however, in the ER apparently had a maximum temperature of 101.1. GENERAL:  Well developed, well nourished.  No apparent distress. HEAD:  Normocephalic, atraumatic. ENT:  Appears grossly intact.  Oropharynx without any erythema or exudate.  Nares without any drainage. NECK:  Supple neck.  No nuchal rigidity.  No obvious JVD. EYES:  Pupils are equal, round, and reactive to light.  Extraocular movements were intact. PULMONARY:  Symmetric expansion.  Good air movement.  No rales, rhonchi, or wheezing. CARDIAC:  Regular rate and rhythm.  Normal S1 and S2.  No murmurs, rubs, thrills, or gallops. VASCULAR:  Palpable pedal pulses bilaterally.  Palpable brachial and radial pulses.  Carotids were easily palpable without any bruits.  The aorta was not palpable due to mild obesity. ABDOMEN:  Soft abdomen, nontender, nondistended.  No guarding,  no rebound, no hepatosplenomegaly.  Well-healed transplant incision in the right lower quadrant, which has absolutely no tenderness to palpation. MUSCULOSKELETAL:  Motor strength is 5/5 throughout.  The left arm demonstrates a thrombosed left upper arm fistula.  There is no bruit or thrill and has mild tenderness to palpation in the area of the thrombosed graft.  This is thrombosed throughout the path of this fistula extending slightly onto the antecubital area. NEURO:  Cranial nerves II through XII were grossly intact.  Motor strength was as above.  Sensation is grossly intact in all extremities. PSYCH:  Judgment was intact.  Mood  affect were appropriate for clinical situation. LYMPHATIC:  No cervical, axillary, or inguinal lymphadenopathy. SKIN:  Extremities were as listed above, otherwise I did not note any signs of gangrene or any ulcerations anywhere.  LABORATORY STUDIES:  CBC; white count of 10.8, H and H of 8.9 and 27.7, platelet count of 324.  BMP; sodium 141, potassium 2.9, chloride 107, bicarb 27, glucose 97, BUN 7, creatinine 0.88.  NONINVASIVE VASCULAR IMAGING:  She had upper extremity DVT studies completed, which did not demonstrate any clot in the left arm.  I finalized this report additionally incidentally arterial duplex is performed and demonstrates triphasic flow in the adjacent artery.  MEDICAL DECISION MAKING:  This is a 45 year old transplant patient who presents with fever.  This is actually the more concerning issue in this patient andaggressive stance in terms of workup should be adopted.  I  recommend involvement of transplant nephrologist to manage the workup for this,  otherwise I strongly suggest referring the patient back to [facility] at [facility].  In regard to the thrombosed left brachiocephalic arteriovenous fistula, she has not been using this for over 2 years.  She is not currently an end-stage renal and her creatinine is completely normal. There is subsequently no advantage to thrombectomizing the fistula whether surgically or percutaneously.  Surgical thrombectomy unfortunately usually fails due to presence of venous outflow stenosis in these cases and percutaneous thrombectomies carry risk of possible pulmonary embolism due to embolization from the fistula during the percutaneous thrombectomy process as the patient is not end-stage renal and is not using this fistula, there is no justification for attempting in my opinion a percutaneous thrombectomy in this case.  Additionally, a question was raised by the primary team in regards to need for anticoagulation.  There is  no need for anticoagulation in this case as the vein that is involved with this thrombotic processes is purely a superficial vein, the cephalic vein, and the only risk of possibly converting to DVT or PE is instrumenting this fistula with either a surgical or percutaneous thrombectomy.  At this point, I do not think any intervention from a vascular surgical view point is needed to be accomplished and once again I reiterate fever in a transplant patient should be treated as sepsis until proven otherwise, I would defer the entire workup once again to either a transplant nephrologist or the patient's original transplant team.     Conrad Luttrell, MD     BLC/MEDQ  D:  03/10/2011  T:  03/11/2011  Job:  [id number]  Electronically Signed by Adele Barthel MD on 03/16/2011 04:38:47 PM

## 2011-04-01 NOTE — Discharge Summary (Signed)
NAME:  Ruth Gutierrez, Ruth Gutierrez NO.:  [id number]  MEDICAL RECORD NO.:  [id number]  LOCATION:                                 FACILITY:  PHYSICIAN:  Jay Schlichter, M.D.  DATE OF BIRTH:  June 11, 1966  DATE OF ADMISSION: DATE OF DISCHARGE:                              DISCHARGE SUMMARY   DISCHARGE DIAGNOSES: 1. Left upper extremity arteriovenous graft thrombosis without deep     venous thrombosis. 2. Status post renal transplant. 3. Anemia. 4. Hypertension.  DISCHARGE MEDICATIONS: 1. Doxycycline 100 mg p.o. daily for 10 days. 2. Ferrous sulfate 650 mg p.o. twice daily with meals. 3. Amlodipine 10 mg p.o. daily. 4. Aspirin 81 mg p.o. daily. 5. Atenolol 50 mg p.o. daily. 6. Benadryl 25 mg p.o. as needed. 7. CellCept 1000 mg p.o. b.i.d. 8. Colace 100 mg two capsules p.o. daily as needed. 9. Lasix 40 mg p.o. daily. 10.Prograf 1 mg eight capsules p.o. twice daily.  DISPOSITION AND FOLLOWUP: 1. Nephrology, this patient sees Dr. Jimmy Footman as an outpatient and     will be called for an appointment to be scheduled within 1-2 weeks.     This patient was discussed with Dr. Deterding's scheduler and the     importance of her close follow up was emphasized. 2. Dr. Sherrie Sport of [facility] on April 04, 2011 at     11:15 a.m.  This will be a new patient visit and the patient's     general health should be assessed.  She should be checked for her     anemia and should be asked about her general health. 3. Dr. Roselie Awkward with the [facility] on May 01, 2011 at 3 p.m.  This appointment is to evaluate the patient for     her fibroids and possible adenomyosis.  PROCEDURES PERFORMED: 1. Pelvic ultrasound; significant for a fibroid uterus and possible     adenomyosis. 2. Left upper extremity Dopplers that showed no evidence of deep vein     or superficial thrombosis involving the left upper extremity.  The     hemodialysis fistula did  appear to be thrombosed.  CONSULTATIONS:  Nephrology, Vascular Surgery, and Gynecology.  BRIEF ADMITTING HISTORY AND PHYSICAL:  This was a 45 year old woman with a history of a cadaveric kidney transplant in 2010 with a baseline creatinine of 1.1, who presented with pain and swelling of her left upper arm 1 week.  She reported that she had fevers and chills since yesterday that had increased from 100.2 to 101.0.  On the day of admission, she noticed that the thrill in her AVM on the left antecubital fossa disappeared 2-3 days ago.  She denied any cuts or scrapes through the arm.  She also reported increased sleepiness, anorexia, dizziness, and headache for the past two days.  She had been eating very little food, but taking in a good amount of fluids.  She denied any chest pain, shortness of breath, nausea, vomiting, or diarrhea.  She also denied any history of peripheral blood clots.  The patient did have an unusually heavy menstrual periods that finished day prior to admission.  The patient did report having  to change her pad three times an hour and reported passing clots of blood.  ADMISSION PHYSICAL EXAMINATION:  VITAL SIGNS:  Temperature 101.1, heart rate 88, blood pressure 95/57, respiratory rate 18, O2 sat 96% on room air. GENERAL:  This is an obese, middle-aged woman resting in bed in no acute distress. CARDIAC:  Regular rate and rhythm.  No murmurs, rubs, or gallops. PULMONARY:  Faint rhonchi bilaterally, moving normal YMs in air. ABDOMEN:  Obese, nontender, nondistended. EXTREMITIES:  Warm and well-perfused with the left upper extremity was edematous and warm from the antecubital fossa to the underarms, slight darkening of the skin on the inner surface of the arm.  No erythema. The tortuous AV fistula present in the antecubital fossa, had two incision sites with no bruits appreciable while auscultation. NEUROLOGIC:  Alert and oriented x3.  Cranial nerves grossly  intact. Strength and sensation to touch equal in bilateral upper and lower extremities.  LABORATORY DATA:  White blood cell count 13.6, hemoglobin 6.1, hematocrit 18.4, platelets 317.  Sodium 135, potassium 2.8, chloride 98, CO2 26, BUN 6, creatinine 1.0, glucose 104.  IMAGING RESULTS:  Chest x-ray was normal.  HOSPITAL COURSE BY PROBLEM: 1. Left upper extremity swelling.  This patient presented with     a swollen and painful left upper extremity with evidence of graft     bruits on auscultation of her AV fistula. She likely had clotted her     graft.  Doppler confirmed that the fistula was intact thrombosed,     but also determined that there was no deep vein thrombosis or other     superficial thrombosis.  The Nephrology Team was consulted as well     as the Vascular Team on how to proceed and it was determined that     no intervention would be necessary given that there was no deep     venous thrombosis.  Options including anticoagulation, percutaneous     intervention and/or surgery were all deemed inappropriate by     Vascular Surgery.  Rather, the most appropriate treatment for the     thrombosis would be warm compresses and elevation of the extremity. 2. Fever in a transplant patient.  This patient was noted to have a     temperature of 101.1 on admission and infectious workup was     initiated the included blood cultures, chest x-ray, and urinalysis,     all of which were negative.  It was thought that the patient may     have a mild phlebitis or cellulitis of the upper extremity in     question and she was started on vancomycin on admission.  The     patient's white blood cell count did decrease and her fever abated     and never returned and it was felt that any likely source of     infection would be in the graft if not the skin.  The patient was     discharged on doxycycline with close follow up with both Nephrology     and Primary Care and instructed to return to the  hospital if she     thought like her fever was returning or if she felt anything else     unusual.  The patient may have benefited from 1 or 2 more days of     IV antibiotics, but was extremely anxious to leave the hospital and     in fact did leave the hospital for roughly 15-20  minutes before     returning and further insisting upon discharge.  We felt that     rather than let the patient leave AMA that would be to her benefit     to be discharged with followup appointments and an appropriate p.o.     antibiotics.  We stressed the importance of returning to the     hospital if her fever returned to this patient and we will follow     up her blood cultures as they finalized.  The patient, however, did     not appear acutely ill and her vital signs remained stable     throughout the duration of the hospitalization. 3. Anemia.  This patient was initially found to have a hemoglobin of     6.1 that the following morning raised up into the 8s as the patient     reported menorrhagia with blood clots and a history of such events     as well.  Gynecology was consulted after pelvic ultrasound showed     fibroids with the largest one is 28 mm and possible adenomyosis.     It is likely that this patient has chronic menorrhagia and possible     metromenorrhagia and anemia panel showed severe iron deficiency     anemia.  The patient was started on anemia and would be continued     as an outpatient and she will receive Gynecology followup in order     to properly treat her fibroids and potentially resolve her anemia. 4. Hypertension.  This patient's blood pressure maintained in     appropriate range during this hospitalization. 5. Hypokalemia.  This patient was found to have an initial potassium     of 2.8 and was repleted accordingly. 6. Status post kidney transplant.  The patient's creatinine remained     at her baseline of 0.8-1.0 throughout the duration of this     hospitalization.  She remained  on her immunosuppressive medicines     and we had no suspicion that her transplant was in danger during     her hospitalization.  DISCHARGE LABS AND VITALS:  Temperature 98.4, pulse of 92, respiratory rate 18, blood pressure 132/89, O2 sat 100% on room air.  Base metabolic panel; sodium [id number], potassium 3.6, chloride 108, CO2 21, BUN 7, creatinine 0.80, glucose 112.  Blood cultures; no growth to date.  CBC; white blood cell count 6.3, hemoglobin 8.0, hematocrit 24.3, platelets 287.  Again, this patient was discharged in a stable condition with close follow up with her nephrologist and her new primary care doctor at [facility].  This patient was very anxious and eager to leave the hospital despite our urging for her to be patient.  We stressed the importance of her close follow up given that she is a transplant patient and she said "as long as my kidney is fine, I can leave."    ______________________________ Rich Reining, MD   ______________________________ Jay Schlichter, M.D.    BW/MEDQ  D:  03/11/2011  T:  03/12/2011  Job:  [id number]  cc:   Stoney Bang, MD Joyice Faster. Deterding, M.D.  Electronically Signed by Rich Reining MD on 03/21/2011 12:11:47 PM Electronically Signed by Bertha Stakes M.D. on 04/01/2011 06:56:17 PM

## 2011-04-04 ENCOUNTER — Encounter: Payer: Self-pay | Admitting: Internal Medicine

## 2011-04-04 ENCOUNTER — Other Ambulatory Visit (HOSPITAL_COMMUNITY)
Admission: RE | Admit: 2011-04-04 | Discharge: 2011-04-04 | Disposition: A | Discharge status: Home / Self Care | Payer: Medicare Other | Source: Ambulatory Visit | Attending: Internal Medicine | Admitting: Internal Medicine

## 2011-04-04 ENCOUNTER — Ambulatory Visit (INDEPENDENT_AMBULATORY_CARE_PROVIDER_SITE_OTHER): Payer: Medicare Other | Admitting: Internal Medicine

## 2011-04-04 DIAGNOSIS — E876 Hypokalemia: Secondary | ICD-10-CM

## 2011-04-04 DIAGNOSIS — Z940 Kidney transplant status: Secondary | ICD-10-CM

## 2011-04-04 DIAGNOSIS — D649 Anemia, unspecified: Secondary | ICD-10-CM

## 2011-04-04 DIAGNOSIS — T82898A Other specified complication of vascular prosthetic devices, implants and grafts, initial encounter: Secondary | ICD-10-CM

## 2011-04-04 DIAGNOSIS — T82868A Thrombosis of vascular prosthetic devices, implants and grafts, initial encounter: Secondary | ICD-10-CM | POA: Insufficient documentation

## 2011-04-04 DIAGNOSIS — D259 Leiomyoma of uterus, unspecified: Secondary | ICD-10-CM

## 2011-04-04 DIAGNOSIS — D509 Iron deficiency anemia, unspecified: Secondary | ICD-10-CM

## 2011-04-04 DIAGNOSIS — I10 Essential (primary) hypertension: Secondary | ICD-10-CM

## 2011-04-04 DIAGNOSIS — Z124 Encounter for screening for malignant neoplasm of cervix: Secondary | ICD-10-CM | POA: Insufficient documentation

## 2011-04-04 DIAGNOSIS — D219 Benign neoplasm of connective and other soft tissue, unspecified: Secondary | ICD-10-CM | POA: Insufficient documentation

## 2011-04-04 DIAGNOSIS — N898 Other specified noninflammatory disorders of vagina: Secondary | ICD-10-CM

## 2011-04-04 DIAGNOSIS — Z0000 Encounter for general adult medical examination without abnormal findings: Secondary | ICD-10-CM

## 2011-04-04 DIAGNOSIS — L293 Anogenital pruritus, unspecified: Secondary | ICD-10-CM

## 2011-04-04 LAB — CBC
MCHC: 30.4 g/dL (ref 30.0–36.0)
MCV: 94.4 fL (ref 78.0–100.0)
Platelets: 283 10*3/uL (ref 150–400)
RDW: 19.9 % — ABNORMAL HIGH (ref 11.5–15.5)
WBC: 6.9 10*3/uL (ref 4.0–10.5)

## 2011-04-04 NOTE — Patient Instructions (Signed)
Please schedule a follow up appointment in 1-2 months. Please take your medicines as prescribed. Please keep up with your appointment with your Gynecologist in September.

## 2011-04-04 NOTE — Assessment & Plan Note (Signed)
Her vaginal and pelvic ultrasound confirmed the diagnosis with a suspicion for adenomyosis as well. I think her blood tinged vaginal discharge is a part of  her metromenorrhagia from uterine fibroids. She has an appointment with her gynecologist on 05/01/2011 to discuss the further management of her uterine fibroids. She was encouraged not to miss her appointment.  For her blood tinged vaginal discharge associated with itching we also did wet prep, checked her for GC and Chlamydia.

## 2011-04-04 NOTE — Assessment & Plan Note (Signed)
Lab Results  Component Value Date   NA 140 03/11/2011   K 3.6 03/11/2011   CL 108 03/11/2011   CO2 21 03/11/2011   BUN 7 03/11/2011   CREATININE 0.80 03/11/2011    BP Readings from Last 3 Encounters:  04/04/11 113/71    Assessment: Hypertension control:  controlled  Progress toward goals:  at goal Barriers to meeting goals:  no barriers identified  Plan: Hypertension treatment:  continue current medications

## 2011-04-04 NOTE — Assessment & Plan Note (Signed)
Likely from uterine fibroids and ? Adenomyosis.  -Check CBC. -Continue iron supplementation.

## 2011-04-04 NOTE — Assessment & Plan Note (Signed)
Follows up with Dr. Jimmy Footman for management of her meds.

## 2011-04-04 NOTE — Assessment & Plan Note (Signed)
She got a pap smear today.

## 2011-04-04 NOTE — Assessment & Plan Note (Signed)
Hospital followup for left upper extremity AV graft thromboses. Dopplers confirmed fistula trombosis without evidence of superficial or deep vein thrombosis. She denies any fever, swelling or pain associated with the graft site. She states that she completed her course of doxycycline after her discharge. Continue to monitor. Follows with Dr. Jimmy Footman for management of her transplant meds.

## 2011-04-04 NOTE — Progress Notes (Signed)
  Subjective:    Patient ID: Ruth Gutierrez, female    DOB: [birth year], 45 y.o.   MRN: [id number]  HPI: 45 year old woman with past medical history significant for renal transplant in April of 2010 secondary to uncontrolled hypertension( underwent dialysis from 2008 until 2010 when she got a graft), iron deficiency anemia from uterine fibroids is a new patient to the clinic for hospital followup.  Patient was recently hospitalized through 8/5 /2012 to  03/12/2011 for left upper extremity AV graft thrombosis. .She had Doppler that confirmed the intact fistula. There was no evidence of superficial or deep vein thrombosis. Nephrology and vascular team decided to do neither anticoagulation nor active intervention. Patient denies any swelling or pain associated with her AV graft for today's visit. She also denies any fevers. States that she has completed her antibiotic course with following her discharge.  As of today, she complains of blood-tinged vaginal discharge for last one week associated with some odor and vaginal itching and was requesting an exam.  she states that her cycles finished at the time of hospital discharge but restarted on 8/ 15/12  and lasted for 2-3 days and then for a week she has been having some blood-tinged discharge. This is very abnormal for her and she has never had this kind of menstrual cycle before. She is sexually active with one female partner.      Review of Systems  Constitutional: Negative for fever and fatigue.  HENT: Negative for ear pain, nosebleeds, congestion, rhinorrhea, sneezing, postnasal drip and tinnitus.   Eyes: Negative for visual disturbance.  Respiratory: Negative for apnea, cough, choking, chest tightness, shortness of breath, wheezing and stridor.   Cardiovascular: Negative for chest pain, palpitations and leg swelling.  Gastrointestinal: Negative for abdominal pain.  Genitourinary: Positive for vaginal discharge and menstrual problem. Negative for  dysuria and frequency.  Musculoskeletal: Negative for arthralgias.  Neurological: Negative for headaches.  Hematological: Negative for adenopathy.       Objective:   Physical Exam  Constitutional: She is oriented to person, place, and time. She appears well-developed and well-nourished. No distress.  HENT:  Head: Normocephalic and atraumatic.  Mouth/Throat: No oropharyngeal exudate.  Eyes: Conjunctivae and EOM are normal. Pupils are equal, round, and reactive to light.  Neck: Normal range of motion. Neck supple. No JVD present. No tracheal deviation present. No thyromegaly present.  Cardiovascular: Normal rate, regular rhythm, normal heart sounds and intact distal pulses.  Exam reveals no gallop and no friction rub.   No murmur heard. Pulmonary/Chest: No stridor. No respiratory distress. She has no wheezes. She has no rales. She exhibits no tenderness.  Abdominal: Soft. Bowel sounds are normal. She exhibits no distension and no mass. There is no tenderness. There is no rebound and no guarding.  Musculoskeletal: Normal range of motion. She exhibits no edema and no tenderness.  Lymphadenopathy:    She has no cervical adenopathy.  Neurological: She is alert and oriented to person, place, and time. She has normal reflexes. She displays normal reflexes. No cranial nerve deficit. She exhibits normal muscle tone. Coordination normal.  Skin: She is not diaphoretic.          Assessment & Plan:

## 2011-04-05 LAB — BASIC METABOLIC PANEL WITH GFR
BUN: 16 mg/dL (ref 6–23)
Chloride: 106 mEq/L (ref 96–112)
Creat: 1.12 mg/dL — ABNORMAL HIGH (ref 0.50–1.10)
GFR, Est African American: >60 mL/min (ref >60)
GFR, Est Non African American: 53 mL/min — ABNORMAL LOW (ref >60)
Potassium: 3.7 mEq/L (ref 3.5–5.3)

## 2011-04-05 LAB — GC/CHLAMYDIA PROBE AMP, GENITAL: Chlamydia, DNA Probe: NEGATIVE

## 2011-04-05 LAB — WET PREP BY MOLECULAR PROBE
Gardnerella vaginalis: POSITIVE — AB
Trichomonas vaginosis: NEGATIVE

## 2011-04-08 ENCOUNTER — Telehealth: Payer: Self-pay | Admitting: Internal Medicine

## 2011-04-08 DIAGNOSIS — B9689 Other specified bacterial agents as the cause of diseases classified elsewhere: Secondary | ICD-10-CM

## 2011-04-08 MED ORDER — METRONIDAZOLE 500 MG PO TABS: ORAL_TABLET | ORAL | Status: DC

## 2011-04-10 ENCOUNTER — Telehealth: Payer: Self-pay | Admitting: *Deleted

## 2011-04-10 NOTE — Telephone Encounter (Signed)
Pt called asking for results of lab test. Results are abnormal so I did not call her.  CBC is improving but creatinine elevated. Pt # [id number]

## 2011-04-10 NOTE — Telephone Encounter (Signed)
Explained the dosing and the side effects. Advised her not to take alcohol with that medication.

## 2011-04-10 NOTE — Telephone Encounter (Signed)
I did discuss the lab results with the patient.

## 2011-04-24 ENCOUNTER — Emergency Department (HOSPITAL_COMMUNITY): Payer: Medicare Other

## 2011-04-24 ENCOUNTER — Inpatient Hospital Stay (HOSPITAL_COMMUNITY)
Admission: EM | Admit: 2011-04-24 | Discharge: 2011-04-28 | DRG: 690 | Disposition: A | Discharge status: Home / Self Care | Payer: Medicare Other | Attending: Nephrology | Admitting: Nephrology

## 2011-04-24 ENCOUNTER — Inpatient Hospital Stay (INDEPENDENT_AMBULATORY_CARE_PROVIDER_SITE_OTHER)
Admission: RE | Admit: 2011-04-24 | Discharge: 2011-04-24 | Disposition: A | Discharge status: Home / Self Care | Payer: Medicare Other | Source: Ambulatory Visit | Attending: Family Medicine | Admitting: Family Medicine

## 2011-04-24 DIAGNOSIS — D638 Anemia in other chronic diseases classified elsewhere: Secondary | ICD-10-CM | POA: Diagnosis present

## 2011-04-24 DIAGNOSIS — R197 Diarrhea, unspecified: Secondary | ICD-10-CM

## 2011-04-24 DIAGNOSIS — R509 Fever, unspecified: Secondary | ICD-10-CM

## 2011-04-24 DIAGNOSIS — Z940 Kidney transplant status: Secondary | ICD-10-CM

## 2011-04-24 DIAGNOSIS — Z7982 Long term (current) use of aspirin: Secondary | ICD-10-CM

## 2011-04-24 DIAGNOSIS — F17200 Nicotine dependence, unspecified, uncomplicated: Secondary | ICD-10-CM | POA: Diagnosis present

## 2011-04-24 DIAGNOSIS — N390 Urinary tract infection, site not specified: Principal | ICD-10-CM | POA: Diagnosis present

## 2011-04-24 LAB — URINALYSIS, ROUTINE W REFLEX MICROSCOPIC
Bilirubin Urine: NEGATIVE
Glucose, UA: 100 mg/dL — AB
Glucose, UA: NEGATIVE mg/dL
Specific Gravity, Urine: 1.018 (ref 1.005–1.030)
pH: 5.5 (ref 5.0–8.0)
pH: 6 (ref 5.0–8.0)

## 2011-04-24 LAB — URINE MICROSCOPIC-ADD ON

## 2011-04-24 LAB — DIFFERENTIAL
Basophils Relative: 0 % (ref 0–1)
Eosinophils Absolute: 0 10*3/uL (ref 0.0–0.7)
Lymphs Abs: 0.8 10*3/uL (ref 0.7–4.0)
Neutrophils Relative %: 81 % — ABNORMAL HIGH (ref 43–77)

## 2011-04-24 LAB — CSF CELL COUNT WITH DIFFERENTIAL
RBC Count, CSF: 4 /mm3 — ABNORMAL HIGH
Tube #: 1
Tube #: 4
WBC, CSF: 0 /mm3 (ref 0–5)

## 2011-04-24 LAB — BASIC METABOLIC PANEL
CO2: 27 mEq/L (ref 19–32)
Chloride: 101 mEq/L (ref 96–112)
Potassium: 3.2 mEq/L — ABNORMAL LOW (ref 3.5–5.1)
Sodium: 137 mEq/L (ref 135–145)

## 2011-04-24 LAB — CBC
Platelets: 148 10*3/uL — ABNORMAL LOW (ref 150–400)
RBC: 3.74 MIL/uL — ABNORMAL LOW (ref 3.87–5.11)
WBC: 12 10*3/uL — ABNORMAL HIGH (ref 4.0–10.5)

## 2011-04-24 LAB — GLUCOSE, CSF: Glucose, CSF: 84 mg/dL — ABNORMAL HIGH (ref 43–76)

## 2011-04-24 LAB — GRAM STAIN

## 2011-04-24 IMAGING — CR DG CHEST 2V
2 series · 2 of 2 positions shown · non-contrast
Comparison: [DATE]

CLINICAL DATA: Fever.  Cough.

CHEST - 2 VIEW

[w chest pa]
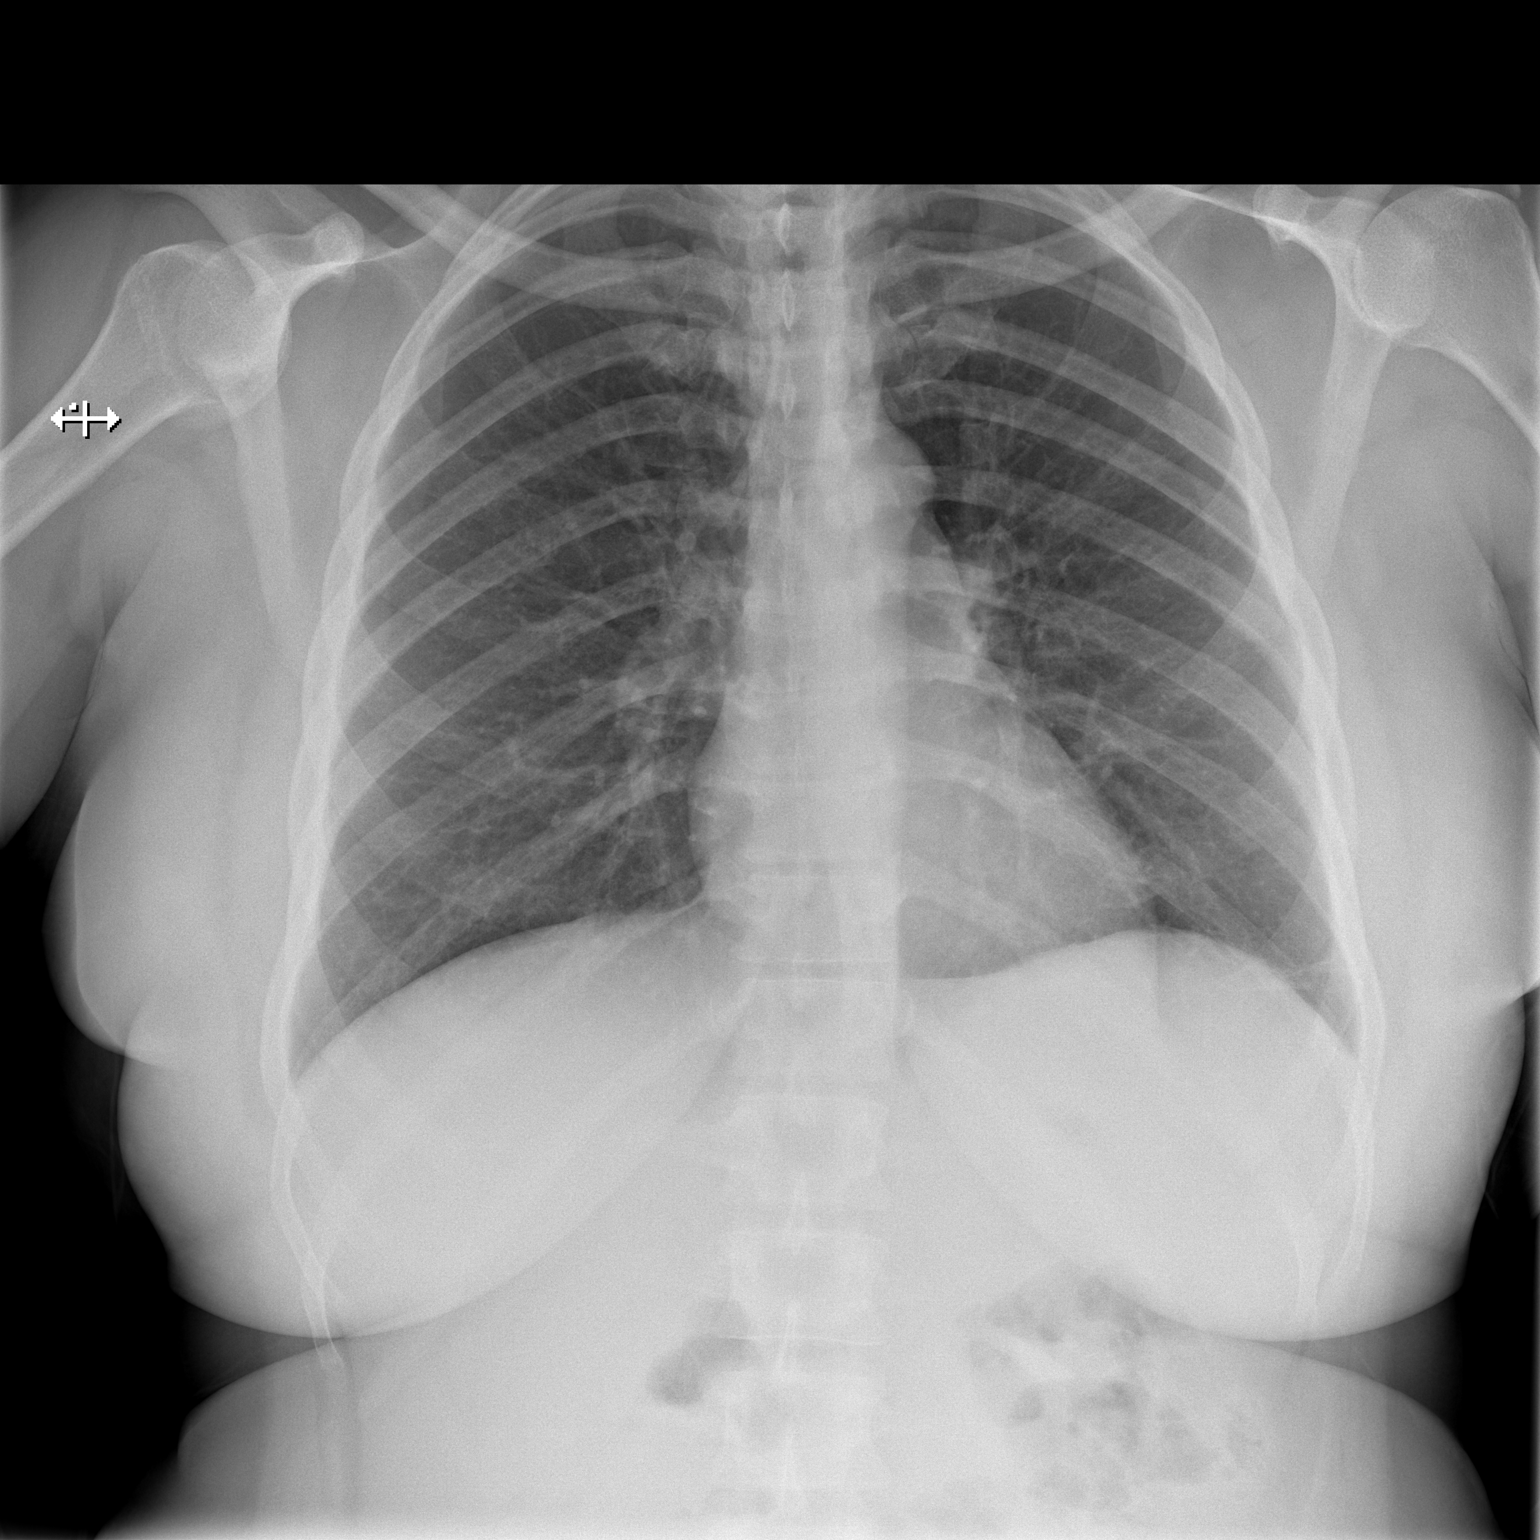

[w chest lat]
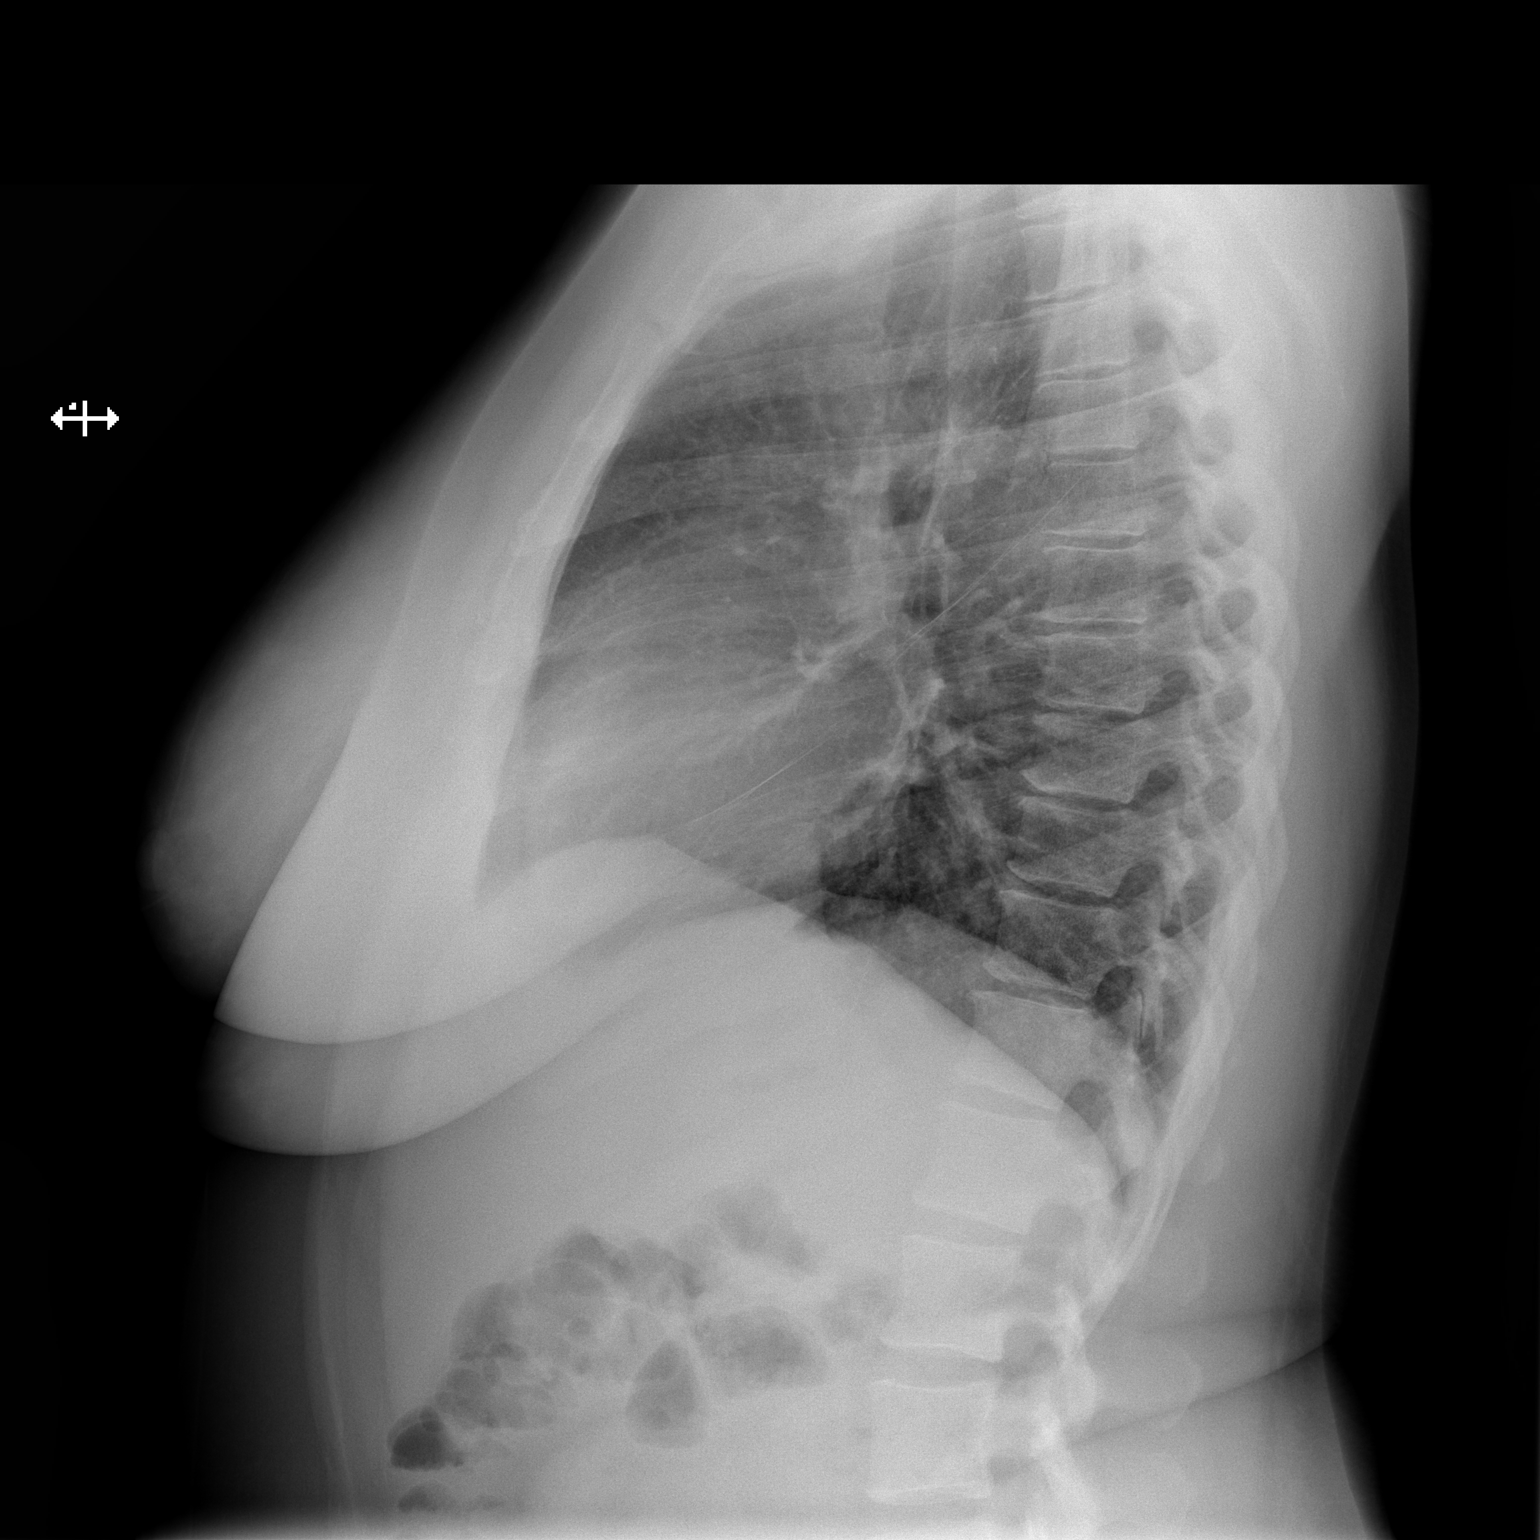

[2 of 2 positions shown; findings below may reference images not displayed]

FINDINGS: New linear subsegmental atelectasis is present laterally
at the left lung base.

The lungs appear otherwise clear.

Cardiac and mediastinal contours appear unremarkable.
IMPRESSION: 1.  Linear subsegmental atelectasis at the left lung base.
Otherwise, no significant abnormality identified.

## 2011-04-25 LAB — COMPREHENSIVE METABOLIC PANEL
ALT: 15 U/L (ref 0–35)
AST: 14 U/L (ref 0–37)
CO2: 23 mEq/L (ref 19–32)
Chloride: 99 mEq/L (ref 96–112)
Creatinine, Ser: 1.4 mg/dL — ABNORMAL HIGH (ref 0.50–1.10)
GFR calc Af Amer: 50 mL/min — ABNORMAL LOW (ref >60)
GFR calc non Af Amer: 41 mL/min — ABNORMAL LOW (ref >60)
Glucose, Bld: 139 mg/dL — ABNORMAL HIGH (ref 70–99)
Total Bilirubin: 0.3 mg/dL (ref 0.3–1.2)

## 2011-04-25 LAB — IRON AND TIBC: Iron: <10 ug/dL — ABNORMAL LOW (ref 42–135)

## 2011-04-25 LAB — CBC
HCT: 33.1 % — ABNORMAL LOW (ref 36.0–46.0)
Hemoglobin: 10.9 g/dL — ABNORMAL LOW (ref 12.0–15.0)
MCH: 29.1 pg (ref 26.0–34.0)
MCHC: 32.9 g/dL (ref 30.0–36.0)
MCV: 88.5 fL (ref 78.0–100.0)
RBC: 3.74 MIL/uL — ABNORMAL LOW (ref 3.87–5.11)

## 2011-04-25 LAB — URINE CULTURE

## 2011-04-25 LAB — HCG, QUANTITATIVE, PREGNANCY: hCG, Beta Chain, Quant, S: <1 m[IU]/mL (ref <5)

## 2011-04-25 LAB — MRSA PCR SCREENING: MRSA by PCR: NEGATIVE

## 2011-04-26 LAB — PHOSPHORUS: Phosphorus: 3.2 mg/dL (ref 2.3–4.6)

## 2011-04-26 LAB — COMPREHENSIVE METABOLIC PANEL
ALT: 14 U/L (ref 0–35)
BUN: 10 mg/dL (ref 6–23)
CO2: 24 mEq/L (ref 19–32)
Calcium: 9.2 mg/dL (ref 8.4–10.5)
Creatinine, Ser: 1.19 mg/dL — ABNORMAL HIGH (ref 0.50–1.10)
GFR calc Af Amer: 60 mL/min — ABNORMAL LOW (ref >60)
GFR calc non Af Amer: 50 mL/min — ABNORMAL LOW (ref >60)
Glucose, Bld: 110 mg/dL — ABNORMAL HIGH (ref 70–99)

## 2011-04-26 LAB — URINE CULTURE

## 2011-04-26 LAB — CBC
HCT: 32.2 % — ABNORMAL LOW (ref 36.0–46.0)
Hemoglobin: 10.6 g/dL — ABNORMAL LOW (ref 12.0–15.0)
MCH: 29 pg (ref 26.0–34.0)
MCV: 88 fL (ref 78.0–100.0)
RBC: 3.66 MIL/uL — ABNORMAL LOW (ref 3.87–5.11)

## 2011-04-27 LAB — CBC
MCH: 29.1 pg (ref 26.0–34.0)
MCHC: 33.1 g/dL (ref 30.0–36.0)
MCV: 87.8 fL (ref 78.0–100.0)
Platelets: 170 10*3/uL (ref 150–400)
RDW: 14.8 % (ref 11.5–15.5)
WBC: 5.8 10*3/uL (ref 4.0–10.5)

## 2011-04-27 LAB — MAGNESIUM: Magnesium: 1.5 mg/dL (ref 1.5–2.5)

## 2011-04-27 LAB — CULTURE, BLOOD (ROUTINE X 2)

## 2011-04-27 LAB — BASIC METABOLIC PANEL
Calcium: 9 mg/dL (ref 8.4–10.5)
Creatinine, Ser: 0.94 mg/dL (ref 0.50–1.10)
GFR calc Af Amer: >60 mL/min (ref >60)

## 2011-04-28 LAB — CBC
Hemoglobin: 10.7 g/dL — ABNORMAL LOW (ref 12.0–15.0)
MCH: 28.4 pg (ref 26.0–34.0)
MCV: 87.3 fL (ref 78.0–100.0)
RBC: 3.77 MIL/uL — ABNORMAL LOW (ref 3.87–5.11)
WBC: 6.3 10*3/uL (ref 4.0–10.5)

## 2011-04-28 LAB — GIARDIA/CRYPTOSPORIDIUM SCREEN(EIA): Cryptosporidium Screen (EIA): NEGATIVE

## 2011-04-28 LAB — GLUCOSE, CAPILLARY: Glucose-Capillary: 73 mg/dL (ref 70–99)

## 2011-04-28 LAB — RENAL FUNCTION PANEL
CO2: 26 mEq/L (ref 19–32)
Calcium: 9.8 mg/dL (ref 8.4–10.5)
Chloride: 106 mEq/L (ref 96–112)
Creatinine, Ser: 0.88 mg/dL (ref 0.50–1.10)
GFR calc Af Amer: >60 mL/min (ref >60)
Glucose, Bld: 108 mg/dL — ABNORMAL HIGH (ref 70–99)
Sodium: 139 mEq/L (ref 135–145)

## 2011-04-28 LAB — CSF CULTURE W GRAM STAIN: Culture: NO GROWTH

## 2011-04-29 LAB — CMV DNA BY PCR, QUALITATIVE

## 2011-05-01 ENCOUNTER — Encounter: Payer: Medicare Other | Admitting: Obstetrics & Gynecology

## 2011-05-01 LAB — CULTURE, BLOOD (ROUTINE X 2)

## 2011-05-12 NOTE — Discharge Summary (Signed)
NAMEMarland Kitchen  AINSLEE, SMETANA NO.:  [id number]  MEDICAL RECORD NO.:  [id number]  LOCATION:  [id number]                         FACILITY:  [facility]  PHYSICIAN:  Donato Heinz, M.D.DATE OF BIRTH:  1966-06-18  DATE OF ADMISSION:  04/24/2011 DATE OF DISCHARGE:  04/28/2011                              DISCHARGE SUMMARY   ADMITTING PHYSICIAN:  Donato Heinz, MD  DISCHARGE DIAGNOSES: 1. Klebsiella pneumonia urosepsis. 2. Renal transplant/end-stage renal disease. 3. Hypertension. 4. Iron deficiency anemia. 5. Tobacco abuse.  HISTORY OF PRESENT ILLNESS:  Ms. Ruth Gutierrez is a 45 year old African American female with past medical history significant for end-stage renal disease secondary to malignant hypertension, status post cadaveric renal transplant April 2010 at [facility]. She had been admitted to [facility] last August with a clotted graft and fever.  She was discharged home on doxycycline for 10 days due to phlebitis and cellulitis of her upper extremity.  She had been feeling well up until 3 days prior to admission when she developed nausea, vomiting, diarrhea, fevers, and headache.  She presented to emergency department at [facility] with the above symptoms and found to have a temperature of 101.1 and underwent CT-guided lumbar puncture while in the emergency department.  She was having persistent fevers and malaise since being admitted for further workup and management.  She has no known drug allergies.  Please refer to the additional physical exam findings in the admission history and physical by Dr. Donato Heinz.  LABORATORY FINDINGS:  Cell count on LP was zero with 4 red cells. Sodium 137, potassium 3.2, chlorides 101, CO2 of 27, BUN 6, creatinine 1.17, glucose 108, calcium 9.2.  White count 12, hemoglobin 11.2, platelets 148,000.  UA, 100 protein, large blood, positive nitrites, moderate leukocytes, many squamous  cells, 11-20 white cells.  0-2 red cells, many bacteria.  Chest x-ray showed linear subsegmental atelectasis at the left base, otherwise normal.  HOSPITAL COURSE: 1. Klebsiella pneumoniae urosepsis.  The patient was admitted and     empirically started on Rocephin and Flagyl.  On day 2, Rocephin was     changed to Zosyn and on day 3, both were discontinued, and she was     changed to Ancef with the finding of urine culture positive     for Klebsiella pneumoniae that was sensitive to cefazolin,     ceftriaxone, Cipro, gentamicin, levofloxacin, tobramycin, and     trimethoprim, sulfa. It was resistant to ampicillin.  Flagyl too was     discontinued.  On April 28, 2011, the rounding physician     changed her antibiotics to receive IV Cipro one dose and to be     discharged home on a 10-day additional course of oral Cipro.  At     the time of discharge, she had one positive blood culture for     Klebsiella pneumoniae and the other was pending.  She is to have a     followup urine culture done approximately a week after completing     antibiotics, which will be approximately the time she has a     followup appointment with Dr. Jimmy Footman. 2. Renal transplant.  Renal labs on admission showed  a BUN of 6,     creatinine of 1.17.  Creatinine bumped to 1.4 on day 2; on the     third day, it drifted back down to 1.19 and at the time of     discharge, was 0.88.  She continued on her usual transplant     medications during her hospitalization and Lasix was resumed at     discharge. 3. Hypertension.  Blood pressure was well controlled with systolic     range of [id number] throughout her hospitalization.  She continues on     the same antihypertensives at discharge. 4. Iron-deficiency anemia.  Hemoglobin on admission was 11.2.  Iron     studies were checked.  Total iron was less than 10.  On April 25, 2011, she is supposed to have been on iron sulfate prior to     admission.  She received  510 mg of Feraheme while in the hospital     and does not require any ESAs. 5. Tobacco abuse ongoing.  She is again instructed to stop smoking for     the benefit of her health.  CONDITION ON DISCHARGE:  Improved.  DISCHARGE DISPOSITION:  She is discharged to home.  ACTIVITY: As tolerated.  DISCHARGE MEDICATIONS: 1. Cipro 500 mg b.i.d. 2. Amlodipine 10 mg 1 tablet daily. 3. Baby aspirin 81 mg daily. 4. Atenolol 50 mg daily. 5. Benadryl 25 mg at bedtime p.r.n. 6. CellCept 250 mg 4 capsules b.i.d. 7. Colace 100 mg 2 capsules daily as needed. 8. Furosemide 40 mg daily. 9. Prograf 8 capsules b.i.d., and she is instructed to resume her     ferrous sulfate twice a day once she completes her course of Cipro.  She has a followup appointment with Dr. Mauricia Area on May 15, 2011.  40 minutes were spent completing the patient's discharge paperwork and discharge summary.     Alric Seton, P.A.C.   ______________________________ Donato Heinz, M.D.    MB/MEDQ  D:  04/28/2011  T:  04/28/2011  Job:  [id number]  Electronically Signed by Alric Seton P.A. on 04/30/2011 03:22:25 PM Electronically Signed by Donato Heinz M.D. on 05/12/2011 01:42:22 PM

## 2011-05-12 NOTE — H&P (Signed)
NAME:  Ruth Gutierrez, Ruth Gutierrez NO.:  [id number]  MEDICAL RECORD NO.:  [id number]  LOCATION:  [id number]                         FACILITY:  [facility]  PHYSICIAN:  Donato Heinz, M.D.DATE OF BIRTH:  12-05-1965  DATE OF ADMISSION:  04/24/2011 DATE OF DISCHARGE:                             HISTORY & PHYSICAL   CHIEF COMPLAINTS:  Fevers, diarrhea, nausea, vomiting.  HISTORY OF PRESENT ILLNESS:  Ms. Ruth Gutierrez is a 45 year old African American female with past medical history significant for end-stage renal disease secondary to malignant hypertension, status post cadaveric kidney transplant in April 2010 at [facility] who was just previously discharged from [facility] __________ with a clotted AV graft and fever.  This looks discharged home on doxycycline for 10 days due to phlebitis and cellulitis of the upper extremity.  She had been feeling well up until 3 days prior toadmission when she developed nausea, vomiting, diarrhea, fevers and headache.  She presented to the emergency department here at [facility] with the above symptoms, was found to have a temperature of 101.1 and underwent CT scan of the lumbar puncture while in the emergency department.  She is having persistent fevers and malaise since being admitted for further workup and management.  She has no known drug allergies.  PAST MEDICAL HISTORY: 1. End-stage renal disease secondary to malignant hypertension. 2. Status post cadaveric kidney transplant on November 28, 2008, with     good allograft function. 3. Hypertension. 4. Tobacco abuse, ongoing. 5. Anemia of chronic disease. 6. Clotted vascular access. 7. Iron deficiency  OUTPATIENT MEDICATIONS: 1. Prograf 8 mg b.i.d. 2. CellCept 1 g b.i.d. 3. Lasix 40 mg daily. 4. Atenolol 50 mg daily. 5. Amlodipine 10 mg daily. 6. Benadryl p.r.n. 7. Colace 100 mg p.r.n. 8. Ferrous sulfate 650 mg b.i.d. 9. Aspirin 81 mg a  day.  FAMILY HISTORY:  Mother alive at age 4.  She has hypertension.  Father is alive at age 16.  She has a brother on dialysis.  SOCIAL HISTORY:  She is divorced.  Her 74 year old daughter lives with her and is now pregnant.  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  She has a tobacco history of half pack a day for the last 30+ years.  Denies alcohol or IV drug use.  REVIEW OF SYSTEMS:  As per HPI.  GENERAL:  Has had fevers, malaise, headache.  CARDIAC:  No chest pain, palpitations, orthopnea, PND. PULMONARY:  No shortness of breath, hemoptysis, but has had a productive cough of white-yellow sputum.  GI:  Has had one episode of nausea and vomiting on Tuesday and has since had diarrhea that has been liquid dark 4-5 times a day.  No hematochezia or melena or bright red blood per rectum.  GU:  No dysuria, pyuria, hematuria, urgency, frequency, retention.  RHEUMATOLOGIC:  No arthralgias or myalgias.  DERMATOLOGIC: No rashes, lumps or bumps.  HEMATOLOGIC:  No abnormal bleeding or bruising.  All other systems negative. PHYSICAL EXAMINATION:  GENERAL:  This is a well-developed, well- nourished female in mild distress. VITAL SIGNS:  Temperature is 101.1, pulse of 107, blood pressure 136/81, respiratory rate is 20, pulse ox is 100% on room air. HEENT:  Head  normocephalic, atraumatic.  Extraocular muscles intact.  No icterus.  Oropharynx without lesions. NECK:  Supple.  No lymphadenopathy.  No bruits. LUNGS:  Has occasional rhonchi at the bases. CARDIAC:  Regular rate and rhythm.  No precordial rub appreciated. ABDOMEN:  Normoactive bowel sounds, soft.  Renal allograft is in the right lower quadrant, was nontender. EXTREMITIES:  No clubbing, cyanosis or edema.  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 NEUROLOGIC:  Grossly intact.  LABORATORY FINDINGS:  Cell count was 0 with 4 red blood cells.  Sodium of 137, potassium 3.2, chloride 101, CO2 27, BUN 6,  creatinine 1.17, glucose 108, calcium 9.2.  White blood cell count 12, hemoglobin 11.2, platelets 148.  Urine cultures and blood cultures pending.  Chest x-ray just showed linear atelectasis, left lung base.  ASSESSMENT AND PLAN: 1. Fevers in a transplant patient with diarrhea.  Recent antibiotic     use, worrisome for C.diff, also differential would be CMV colitis     as well as urinary tract infection as she does have recurrent UTIs,     less likely is a pneumonia and continue to follow closely, start     the patient on Rocephin 1 g IV daily and Flagyl 500 mg p.o. t.i.d.     We will check a C. diff PCR, CMV, DNA PCR.  Urine cultures are     pending. 2. Headache status post lumbar puncture, negative for meningitis. 3. End-stage renal disease status post cadaveric kidney transplant.     Continue with Prograf at 8 b.i.d. but we will decrease her CellCept     to 250 mg b.i.d. for the next 2 days and gradually increase the     CellCept dose if GI tolerates and may need to switch her to     Myfortic. 4. Hypertension.  Hold her Lasix given her nausea, vomiting and     diarrhea.  Continue with atenolol and Norvasc. 5. Anemia and iron deficiency, we will follow.          ______________________________ Donato Heinz, M.D.     JC/MEDQ  D:  04/24/2011  T:  04/25/2011  Job:  [id number]  Electronically Signed by Donato Heinz M.D. on 05/12/2011 01:42:45 PM

## 2011-05-14 LAB — CATH TIP CULTURE: Culture: >100

## 2011-05-21 LAB — CBC
HCT: 25.4 — ABNORMAL LOW
HCT: 25.8 — ABNORMAL LOW
HCT: 26.2 — ABNORMAL LOW
HCT: 26.8 — ABNORMAL LOW
HCT: 30.7 — ABNORMAL LOW
Hemoglobin: 8.7 — ABNORMAL LOW
Hemoglobin: 8.8 — ABNORMAL LOW
Hemoglobin: 8.8 — ABNORMAL LOW
Hemoglobin: 9 — ABNORMAL LOW
MCHC: 33.2
MCHC: 33.5
MCHC: 33.6
MCHC: 33.6
MCHC: 33.6
MCHC: 34.6
MCV: 88.7
MCV: 90.5
MCV: 90.8
MCV: 91.3
MCV: 91.7
MCV: 92.4
Platelets: 128 — ABNORMAL LOW
Platelets: 146 — ABNORMAL LOW
Platelets: 147 — ABNORMAL LOW
Platelets: 192
Platelets: 237
RBC: 2.77 — ABNORMAL LOW
RBC: 2.85 — ABNORMAL LOW
RBC: 2.87 — ABNORMAL LOW
RBC: 2.87 — ABNORMAL LOW
RBC: 2.95 — ABNORMAL LOW
RDW: 14.1 — ABNORMAL HIGH
RDW: 14.3 — ABNORMAL HIGH
RDW: 14.4 — ABNORMAL HIGH
RDW: 15 — ABNORMAL HIGH
WBC: 10.3
WBC: 11.6 — ABNORMAL HIGH
WBC: 12.1 — ABNORMAL HIGH
WBC: 9.3

## 2011-05-21 LAB — PROTEIN / CREATININE RATIO, URINE
Creatinine, Urine: 57.2
Protein Creatinine Ratio: 1.29 — ABNORMAL HIGH
Total Protein, Urine: 74

## 2011-05-21 LAB — RENAL FUNCTION PANEL
Albumin: 2.8 — ABNORMAL LOW
Albumin: 2.8 — ABNORMAL LOW
Albumin: 2.9 — ABNORMAL LOW
BUN: 32 — ABNORMAL HIGH
BUN: 50 — ABNORMAL HIGH
BUN: 50 — ABNORMAL HIGH
BUN: 53 — ABNORMAL HIGH
CO2: 25
CO2: 29
Calcium: 8.1 — ABNORMAL LOW
Calcium: 8.4
Calcium: 8.6
Calcium: 8.8
Chloride: 101
Chloride: 104
Creatinine, Ser: 5.82 — ABNORMAL HIGH
Creatinine, Ser: 6.71 — ABNORMAL HIGH
Creatinine, Ser: 6.96 — ABNORMAL HIGH
Creatinine, Ser: 8.53 — ABNORMAL HIGH
GFR calc Af Amer: 8 — ABNORMAL LOW
GFR calc non Af Amer: 5 — ABNORMAL LOW
GFR calc non Af Amer: 7 — ABNORMAL LOW
Glucose, Bld: 104 — ABNORMAL HIGH
Glucose, Bld: 86
Glucose, Bld: 97
Phosphorus: 6 — ABNORMAL HIGH
Phosphorus: 7.2 — ABNORMAL HIGH
Phosphorus: 8.2 — ABNORMAL HIGH
Potassium: 4.2
Potassium: 4.3
Potassium: 4.6
Sodium: 135

## 2011-05-21 LAB — BASIC METABOLIC PANEL
BUN: 30 — ABNORMAL HIGH
Calcium: 8.7
Creatinine, Ser: 5.84 — ABNORMAL HIGH
GFR calc Af Amer: 10 — ABNORMAL LOW

## 2011-05-21 LAB — PHOSPHORUS: Phosphorus: 5.7 — ABNORMAL HIGH

## 2011-05-22 LAB — URINE MICROSCOPIC-ADD ON

## 2011-05-22 LAB — UIFE/LIGHT CHAINS/TP QN, 24-HR UR
Beta, Urine: DETECTED — AB
Free Kappa Lt Chains,Ur: 10.4 — ABNORMAL HIGH (ref 0.04–1.51)
Free Lambda Excretion/Day: 87.5
Free Lambda Lt Chains,Ur: 3.5 — ABNORMAL HIGH (ref 0.08–1.01)
Free Lt Chn Excr Rate: 260
Gamma Globulin, Urine: DETECTED — AB
Time: 24
Total Protein, Urine-Ur/day: 2798 — ABNORMAL HIGH (ref 10–140)

## 2011-05-22 LAB — CARDIAC PANEL(CRET KIN+CKTOT+MB+TROPI)
CK, MB: 3.5
Relative Index: 1.5

## 2011-05-22 LAB — COMPREHENSIVE METABOLIC PANEL
ALT: 13
ALT: 14
AST: 11
Albumin: 2.4 — ABNORMAL LOW
Alkaline Phosphatase: 43
Alkaline Phosphatase: 50
BUN: 51 — ABNORMAL HIGH
BUN: 63 — ABNORMAL HIGH
CO2: 21
CO2: 21
Chloride: 112
Creatinine, Ser: 8.12 — ABNORMAL HIGH
GFR calc Af Amer: 7 — ABNORMAL LOW
GFR calc non Af Amer: 5 — ABNORMAL LOW
Glucose, Bld: 98
Potassium: 4.2
Potassium: 4.8
Sodium: 139
Total Bilirubin: 0.6
Total Bilirubin: 0.7
Total Protein: 5.2 — ABNORMAL LOW
Total Protein: 6.1

## 2011-05-22 LAB — CROSSMATCH: Antibody Screen: NEGATIVE

## 2011-05-22 LAB — RENAL FUNCTION PANEL
Albumin: 2.3 — ABNORMAL LOW
Albumin: 2.8 — ABNORMAL LOW
BUN: 54 — ABNORMAL HIGH
BUN: 67 — ABNORMAL HIGH
CO2: 21
CO2: 21
CO2: 24
Calcium: 8.2 — ABNORMAL LOW
Chloride: 107
Chloride: 108
Chloride: 110
Creatinine, Ser: 8.12 — ABNORMAL HIGH
Creatinine, Ser: 8.21 — ABNORMAL HIGH
GFR calc Af Amer: 7 — ABNORMAL LOW
GFR calc Af Amer: 7 — ABNORMAL LOW
GFR calc non Af Amer: 6 — ABNORMAL LOW
Glucose, Bld: 96
Potassium: 4.2
Potassium: 5.5 — ABNORMAL HIGH
Sodium: 138

## 2011-05-22 LAB — CBC
HCT: 25.8 — ABNORMAL LOW
HCT: 27 — ABNORMAL LOW
HCT: 27.1 — ABNORMAL LOW
HCT: 28.5 — ABNORMAL LOW
Hemoglobin: 8.3 — ABNORMAL LOW
Hemoglobin: 8.8 — ABNORMAL LOW
Hemoglobin: 9.4 — ABNORMAL LOW
Hemoglobin: 9.6 — ABNORMAL LOW
MCHC: 33.3
MCHC: 34
MCV: 88.5
MCV: 90.5
MCV: 91.1
Platelets: 186
Platelets: 202
Platelets: 234
RBC: 3.16 — ABNORMAL LOW
RDW: 13.7
RDW: 13.8
RDW: 14
WBC: 6.6
WBC: 7.5
WBC: 7.9

## 2011-05-22 LAB — I-STAT 8, (EC8 V) (CONVERTED LAB)
BUN: 69 — ABNORMAL HIGH
Bicarbonate: 20.4
Chloride: 109
HCT: 32 — ABNORMAL LOW
Hemoglobin: 10.9 — ABNORMAL LOW
Operator id: 277751
Potassium: 4.7
Sodium: 137

## 2011-05-22 LAB — TSH: TSH: 0.705

## 2011-05-22 LAB — ANTI-NEUTROPHIL ANTIBODY: Cytoplasmic Neutrophilic Ab: <1:20 {titer}

## 2011-05-22 LAB — DIFFERENTIAL
Basophils Relative: 0
Eosinophils Absolute: 0.1
Eosinophils Relative: 1
Lymphs Abs: 2
Monocytes Relative: 8
Neutrophils Relative %: 66

## 2011-05-22 LAB — PROTEIN ELECTROPH W RFLX QUANT IMMUNOGLOBULINS
Albumin ELP: 50.6 — ABNORMAL LOW
Alpha-2-Globulin: 14.1 — ABNORMAL HIGH
Beta 2: 4.7
Beta Globulin: 7.1
M-Spike, %: NOT DETECTED

## 2011-05-22 LAB — URINALYSIS, ROUTINE W REFLEX MICROSCOPIC
Protein, ur: >300 — AB
Urobilinogen, UA: 0.2

## 2011-05-22 LAB — CK TOTAL AND CKMB (NOT AT ARMC)
CK, MB: 4
Total CK: 224 — ABNORMAL HIGH

## 2011-05-22 LAB — IRON AND TIBC
Saturation Ratios: 20
UIBC: 201

## 2011-05-22 LAB — HEPATITIS B SURFACE ANTIGEN: Hepatitis B Surface Ag: NEGATIVE

## 2011-05-22 LAB — BASIC METABOLIC PANEL
BUN: 63 — ABNORMAL HIGH
Calcium: 7.4 — ABNORMAL LOW
Chloride: 107
Creatinine, Ser: 7.96 — ABNORMAL HIGH
GFR calc Af Amer: 7 — ABNORMAL LOW
GFR calc non Af Amer: 6 — ABNORMAL LOW

## 2011-05-22 LAB — ABO/RH: ABO/RH(D): A POS

## 2011-05-22 LAB — LIPASE, BLOOD: Lipase: 32

## 2011-05-22 LAB — AMYLASE: Amylase: 183 — ABNORMAL HIGH

## 2011-05-22 LAB — PTH, INTACT AND CALCIUM: PTH: 403 — ABNORMAL HIGH

## 2011-05-22 LAB — TROPONIN I: Troponin I: 0.08 — ABNORMAL HIGH

## 2011-05-22 LAB — PHOSPHORUS: Phosphorus: 6.1 — ABNORMAL HIGH

## 2011-05-22 LAB — GLOMERULAR BASEMENT MEMBRANE ANTIBODIES: GBM Ab: 1 AU/mL

## 2011-05-26 ENCOUNTER — Ambulatory Visit (INDEPENDENT_AMBULATORY_CARE_PROVIDER_SITE_OTHER): Payer: Medicare Other | Admitting: Internal Medicine

## 2011-05-26 ENCOUNTER — Encounter: Payer: Self-pay | Admitting: Internal Medicine

## 2011-05-26 VITALS — BP 130/87 | HR 93 | Temp 98.7°F | Resp 20 | Ht 66.5 in | Wt 194.4 lb

## 2011-05-26 DIAGNOSIS — Z940 Kidney transplant status: Secondary | ICD-10-CM

## 2011-05-26 DIAGNOSIS — F17200 Nicotine dependence, unspecified, uncomplicated: Secondary | ICD-10-CM

## 2011-05-26 DIAGNOSIS — B961 Klebsiella pneumoniae [K. pneumoniae] as the cause of diseases classified elsewhere: Secondary | ICD-10-CM

## 2011-05-26 DIAGNOSIS — A414 Sepsis due to anaerobes: Secondary | ICD-10-CM

## 2011-05-26 DIAGNOSIS — R5383 Other fatigue: Secondary | ICD-10-CM

## 2011-05-26 DIAGNOSIS — I10 Essential (primary) hypertension: Secondary | ICD-10-CM

## 2011-05-26 DIAGNOSIS — Z720 Tobacco use: Secondary | ICD-10-CM

## 2011-05-26 DIAGNOSIS — D509 Iron deficiency anemia, unspecified: Secondary | ICD-10-CM

## 2011-05-26 NOTE — Patient Instructions (Signed)
Please schedule a  follow up appointment in 3 months or earlier if needed. Please bring your medication bottles with your next clinic appointment. I will call you only if lab results are abnormal.

## 2011-05-26 NOTE — Progress Notes (Signed)
  Subjective:    Patient ID: Ruth Gutierrez, female    DOB: July 05, 1966, 45 y.o.   MRN: [id number]  HPI: 45 year old woman with past medical history significant for end-stage renal disease malignant hypertension status post cadaveric renal graft at [facility] in April 2010 comes to the clinic for a hospital followup.  She was recently hospitalized through 04/23/2004 to 04/28/2011 for septic shock secondary to Klebsiella UTI. She was discharged home on ciprofloxacin and states that she has completed her course of antibiotics. She states that she had symptoms of dizziness last week associated with some nausea and subjective fevers. She describes her dizziness as feeling of lightheadedness.  As of today she reports feeling well-denies any abdominal pain, fever, chills, urinary complaints, nausea or vomiting. She just reports feeling fatigued and tired.  She reports occasional non - productive cough associated with rhinorrhea but denies any postnasal drip, sinus congestion. She currently reports smoking few cigarettes a day.      Review of Systems  Constitutional: Positive for fatigue. Negative for fever and chills.  HENT: Positive for rhinorrhea. Negative for nosebleeds, congestion, sore throat, drooling and neck pain.   Respiratory: Positive for cough. Negative for apnea, choking, chest tightness and wheezing.   Cardiovascular: Negative for chest pain, palpitations and leg swelling.  Gastrointestinal: Negative for nausea, vomiting, abdominal pain, diarrhea and constipation.  Genitourinary: Negative for dysuria, urgency and hematuria.  Musculoskeletal: Negative for arthralgias.  Neurological: Negative for dizziness, facial asymmetry, light-headedness and headaches.  Hematological: Negative for adenopathy.       Objective:   Physical Exam  Constitutional: She is oriented to person, place, and time. She appears well-developed and well-nourished. No distress.  HENT:  Head: Normocephalic and  atraumatic.  Mouth/Throat: No oropharyngeal exudate.  Eyes: Conjunctivae and EOM are normal. Pupils are equal, round, and reactive to light. Right eye exhibits no discharge. Left eye exhibits no discharge.  Neck: Normal range of motion. Neck supple. No JVD present. No tracheal deviation present. No thyromegaly present.  Cardiovascular: Normal rate, regular rhythm and intact distal pulses.  Exam reveals no gallop and no friction rub.   No murmur heard. Pulmonary/Chest: Effort normal and breath sounds normal. No stridor. No respiratory distress. She has no wheezes. She has no rales. She exhibits no tenderness.  Abdominal: Soft. Bowel sounds are normal. She exhibits no distension and no mass. There is no tenderness. There is no rebound and no guarding.  Musculoskeletal: Normal range of motion. She exhibits no edema and no tenderness.  Lymphadenopathy:    She has no cervical adenopathy.  Neurological: She is alert and oriented to person, place, and time. She has normal reflexes. She displays normal reflexes. No cranial nerve deficit. Coordination normal.  Skin: Skin is warm. She is not diaphoretic.          Assessment & Plan:

## 2011-05-27 LAB — BASIC METABOLIC PANEL WITH GFR
BUN: 13 mg/dL (ref 6–23)
Calcium: 9.5 mg/dL (ref 8.4–10.5)
Creat: 1 mg/dL (ref 0.50–1.10)
GFR, Est African American: 80 mL/min — ABNORMAL LOW (ref >90)
GFR, Est Non African American: 69 mL/min — ABNORMAL LOW (ref >90)

## 2011-05-27 LAB — TSH: TSH: 0.013 u[IU]/mL — ABNORMAL LOW (ref 0.350–4.500)

## 2011-06-02 ENCOUNTER — Encounter: Payer: Self-pay | Admitting: Internal Medicine

## 2011-06-02 DIAGNOSIS — Z720 Tobacco use: Secondary | ICD-10-CM | POA: Insufficient documentation

## 2011-06-02 DIAGNOSIS — A414 Sepsis due to anaerobes: Secondary | ICD-10-CM | POA: Insufficient documentation

## 2011-06-02 DIAGNOSIS — R5383 Other fatigue: Secondary | ICD-10-CM | POA: Insufficient documentation

## 2011-06-02 NOTE — Assessment & Plan Note (Signed)
She was counseled on smoking cessation and various means were discussed. But the patient did not show her interest in pursuing that further.

## 2011-06-02 NOTE — Assessment & Plan Note (Signed)
Hospital followup for Klebsiella related UTI. Urine cultures ( 9/20) and 1/2 blood cultures( 9/20) were positive for Klebsiella. She reports that she has completed her course of antibiotics from discharge and denied any abdominal pain, fevers, urinary symptoms or nausea, vomiting with today's visit.

## 2011-06-02 NOTE — Assessment & Plan Note (Signed)
Lab Results  Component Value Date   NA 141 05/26/2011   K 3.9 05/26/2011   CL 103 05/26/2011   CO2 23 05/26/2011   BUN 13 05/26/2011   CREATININE 1.00 05/26/2011   CREATININE 0.88 04/28/2011    BP Readings from Last 3 Encounters:  05/26/11 130/87  04/04/11 113/71    Assessment: Hypertension control:  controlled  Progress toward goals:  at goal Barriers to meeting goals:  no barriers identified  Plan: Hypertension treatment:  continue current medications. Will check her BMET today.

## 2011-06-02 NOTE — Assessment & Plan Note (Signed)
Follows up with [facility] ho manages her immunosuppressive medications.

## 2011-06-02 NOTE — Assessment & Plan Note (Signed)
She reports feeling weak and tired. Differentials include recovery from her acute illness versus iron deficiency anemia versus hypothyroidism. I think the most likely etiology for her symptoms is a combination of recovery from acute illness and iron deficiency anemia.  -She was encouraged to continue taking iron supplementation. -We'll also check her TSH today.

## 2011-09-05 ENCOUNTER — Encounter: Payer: Medicare Other | Admitting: Internal Medicine

## 2013-02-02 ENCOUNTER — Inpatient Hospital Stay (HOSPITAL_COMMUNITY)
Admission: EM | Admit: 2013-02-02 | Discharge: 2013-02-03 | DRG: 569 | Disposition: A | Discharge status: Home / Self Care | Payer: BC Managed Care – PPO | Attending: Internal Medicine | Admitting: Internal Medicine

## 2013-02-02 ENCOUNTER — Encounter (HOSPITAL_COMMUNITY): Payer: Self-pay | Admitting: Emergency Medicine

## 2013-02-02 ENCOUNTER — Emergency Department (HOSPITAL_COMMUNITY): Payer: BC Managed Care – PPO

## 2013-02-02 DIAGNOSIS — R197 Diarrhea, unspecified: Secondary | ICD-10-CM

## 2013-02-02 DIAGNOSIS — R509 Fever, unspecified: Secondary | ICD-10-CM

## 2013-02-02 DIAGNOSIS — N186 End stage renal disease: Secondary | ICD-10-CM | POA: Diagnosis present

## 2013-02-02 DIAGNOSIS — N179 Acute kidney failure, unspecified: Secondary | ICD-10-CM | POA: Diagnosis present

## 2013-02-02 DIAGNOSIS — A088 Other specified intestinal infections: Secondary | ICD-10-CM | POA: Diagnosis present

## 2013-02-02 DIAGNOSIS — A084 Viral intestinal infection, unspecified: Secondary | ICD-10-CM | POA: Diagnosis present

## 2013-02-02 DIAGNOSIS — R112 Nausea with vomiting, unspecified: Secondary | ICD-10-CM

## 2013-02-02 DIAGNOSIS — T8610 Unspecified complication of kidney transplant: Secondary | ICD-10-CM | POA: Diagnosis present

## 2013-02-02 DIAGNOSIS — A498 Other bacterial infections of unspecified site: Secondary | ICD-10-CM | POA: Diagnosis present

## 2013-02-02 DIAGNOSIS — N390 Urinary tract infection, site not specified: Secondary | ICD-10-CM

## 2013-02-02 DIAGNOSIS — D638 Anemia in other chronic diseases classified elsewhere: Secondary | ICD-10-CM | POA: Diagnosis present

## 2013-02-02 DIAGNOSIS — R079 Chest pain, unspecified: Secondary | ICD-10-CM

## 2013-02-02 DIAGNOSIS — Z992 Dependence on renal dialysis: Secondary | ICD-10-CM

## 2013-02-02 DIAGNOSIS — D509 Iron deficiency anemia, unspecified: Secondary | ICD-10-CM | POA: Diagnosis present

## 2013-02-02 DIAGNOSIS — N10 Acute tubulo-interstitial nephritis: Secondary | ICD-10-CM | POA: Diagnosis present

## 2013-02-02 DIAGNOSIS — I120 Hypertensive chronic kidney disease with stage 5 chronic kidney disease or end stage renal disease: Secondary | ICD-10-CM | POA: Diagnosis present

## 2013-02-02 DIAGNOSIS — Z940 Kidney transplant status: Secondary | ICD-10-CM

## 2013-02-02 DIAGNOSIS — F17200 Nicotine dependence, unspecified, uncomplicated: Secondary | ICD-10-CM | POA: Diagnosis present

## 2013-02-02 DIAGNOSIS — N12 Tubulo-interstitial nephritis, not specified as acute or chronic: Principal | ICD-10-CM | POA: Diagnosis present

## 2013-02-02 DIAGNOSIS — Z7982 Long term (current) use of aspirin: Secondary | ICD-10-CM

## 2013-02-02 DIAGNOSIS — Z880 Allergy status to penicillin: Secondary | ICD-10-CM

## 2013-02-02 DIAGNOSIS — N289 Disorder of kidney and ureter, unspecified: Secondary | ICD-10-CM

## 2013-02-02 LAB — URINALYSIS, ROUTINE W REFLEX MICROSCOPIC
Bilirubin Urine: NEGATIVE
Glucose, UA: NEGATIVE mg/dL
Ketones, ur: NEGATIVE mg/dL
Nitrite: NEGATIVE
Protein, ur: 30 mg/dL — AB

## 2013-02-02 LAB — URINE MICROSCOPIC-ADD ON

## 2013-02-02 LAB — BASIC METABOLIC PANEL
Calcium: 9.3 mg/dL (ref 8.4–10.5)
Glucose, Bld: 80 mg/dL (ref 70–99)
Sodium: 134 mEq/L — ABNORMAL LOW (ref 135–145)

## 2013-02-02 LAB — RAPID URINE DRUG SCREEN, HOSP PERFORMED
Amphetamines: NOT DETECTED
Barbiturates: NOT DETECTED
Benzodiazepines: NOT DETECTED
Tetrahydrocannabinol: NOT DETECTED

## 2013-02-02 LAB — MAGNESIUM: Magnesium: 1.6 mg/dL (ref 1.5–2.5)

## 2013-02-02 LAB — CBC
MCH: 23.8 pg — ABNORMAL LOW (ref 26.0–34.0)
MCHC: 30.9 g/dL (ref 30.0–36.0)
Platelets: 258 10*3/uL (ref 150–400)
RDW: 17.4 % — ABNORMAL HIGH (ref 11.5–15.5)

## 2013-02-02 LAB — HEPATIC FUNCTION PANEL
Bilirubin, Direct: <0.1 mg/dL (ref 0.0–0.3)
Total Bilirubin: 0.2 mg/dL — ABNORMAL LOW (ref 0.3–1.2)

## 2013-02-02 LAB — LIPASE, BLOOD: Lipase: 30 U/L (ref 11–59)

## 2013-02-02 LAB — LACTIC ACID, PLASMA: Lactic Acid, Venous: 1.9 mmol/L (ref 0.5–2.2)

## 2013-02-02 LAB — POCT I-STAT TROPONIN I: Troponin i, poc: 0 ng/mL (ref 0.00–0.08)

## 2013-02-02 LAB — CREATININE, URINE, RANDOM: Creatinine, Urine: 62.09 mg/dL

## 2013-02-02 IMAGING — CR DG CHEST 2V
2 series · 2 of 2 positions shown · non-contrast
Comparison: [DATE].

CLINICAL DATA: Cough.  Chest pressure.  Short of breath for 2
weeks.

CHEST - 2 VIEW

[w chest pa]
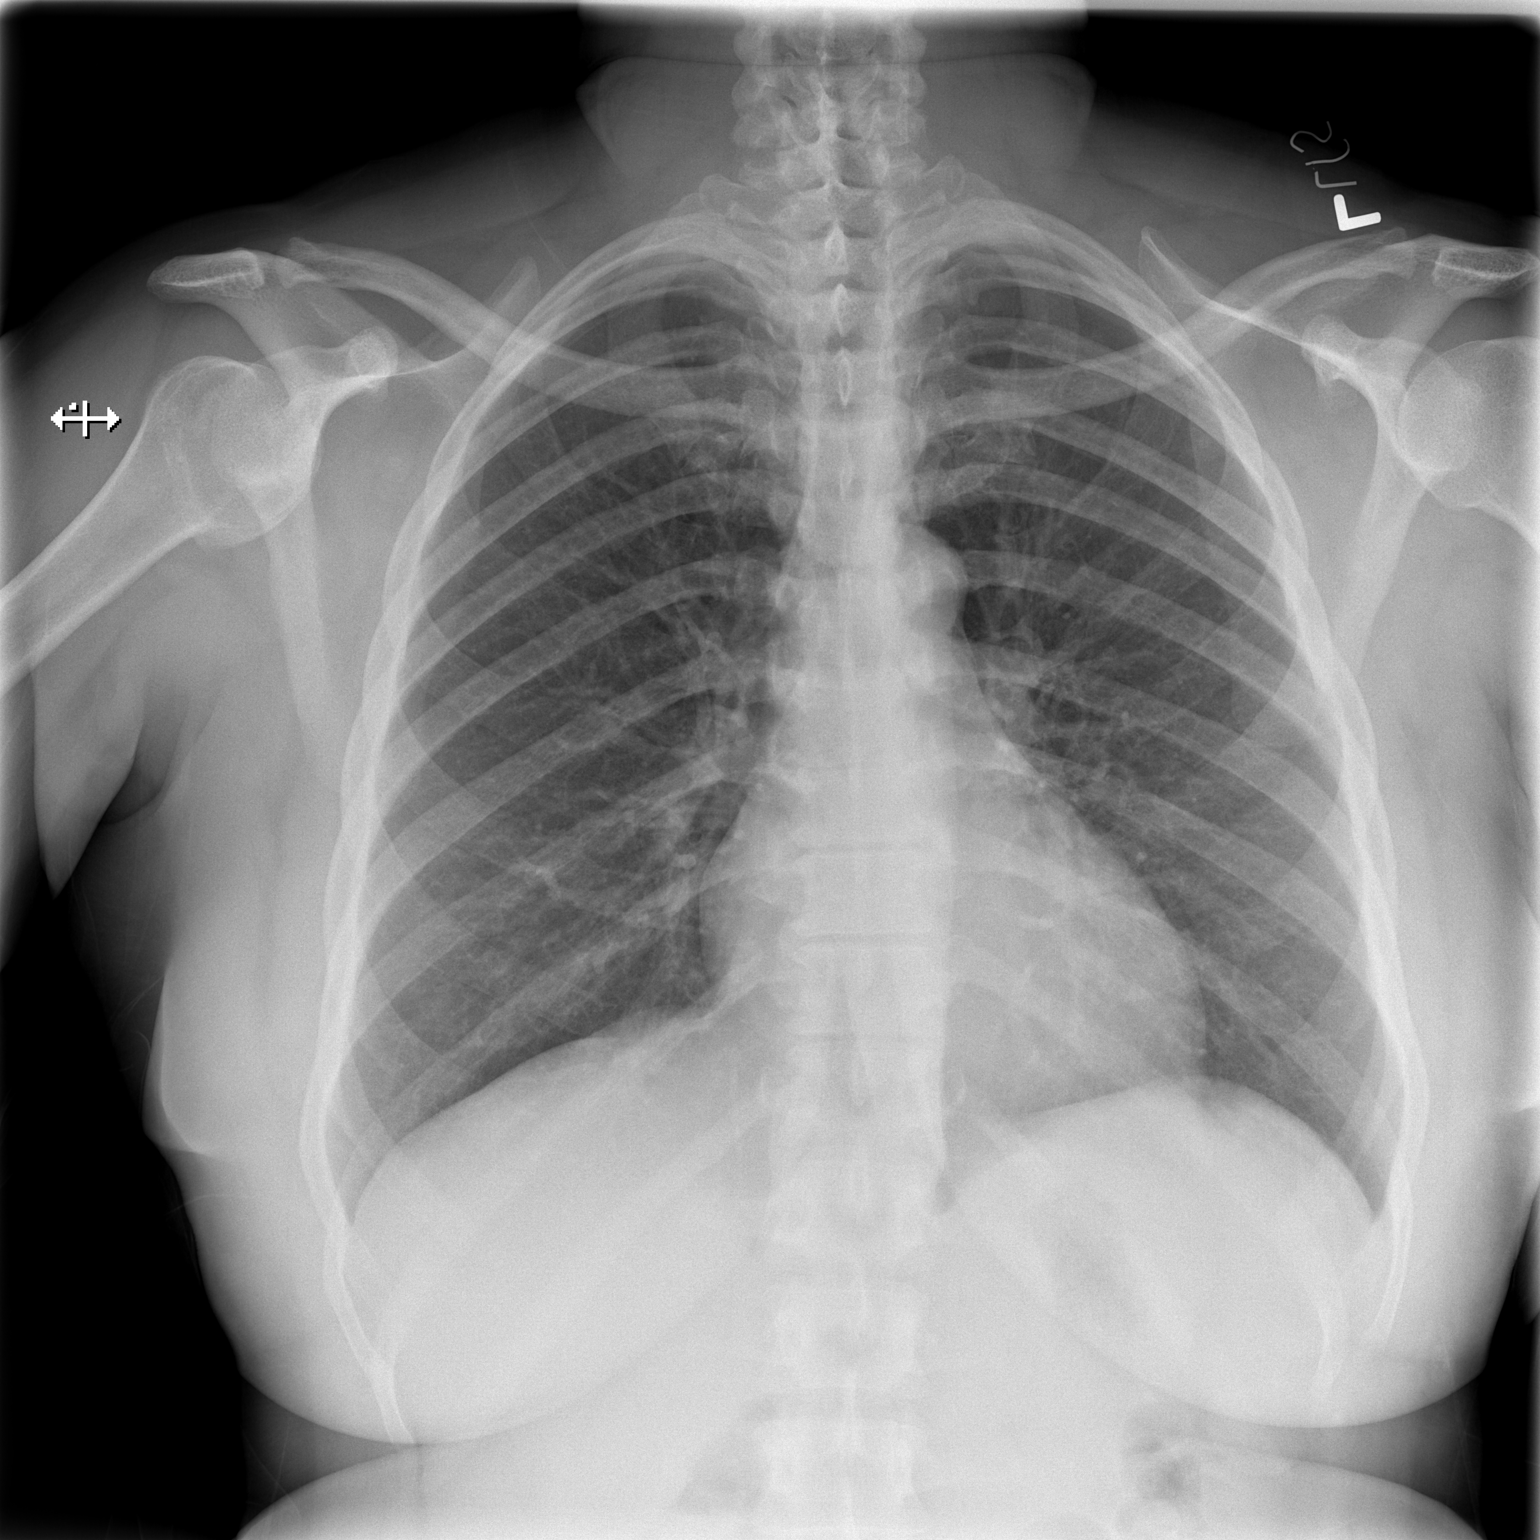

[w chest lat]
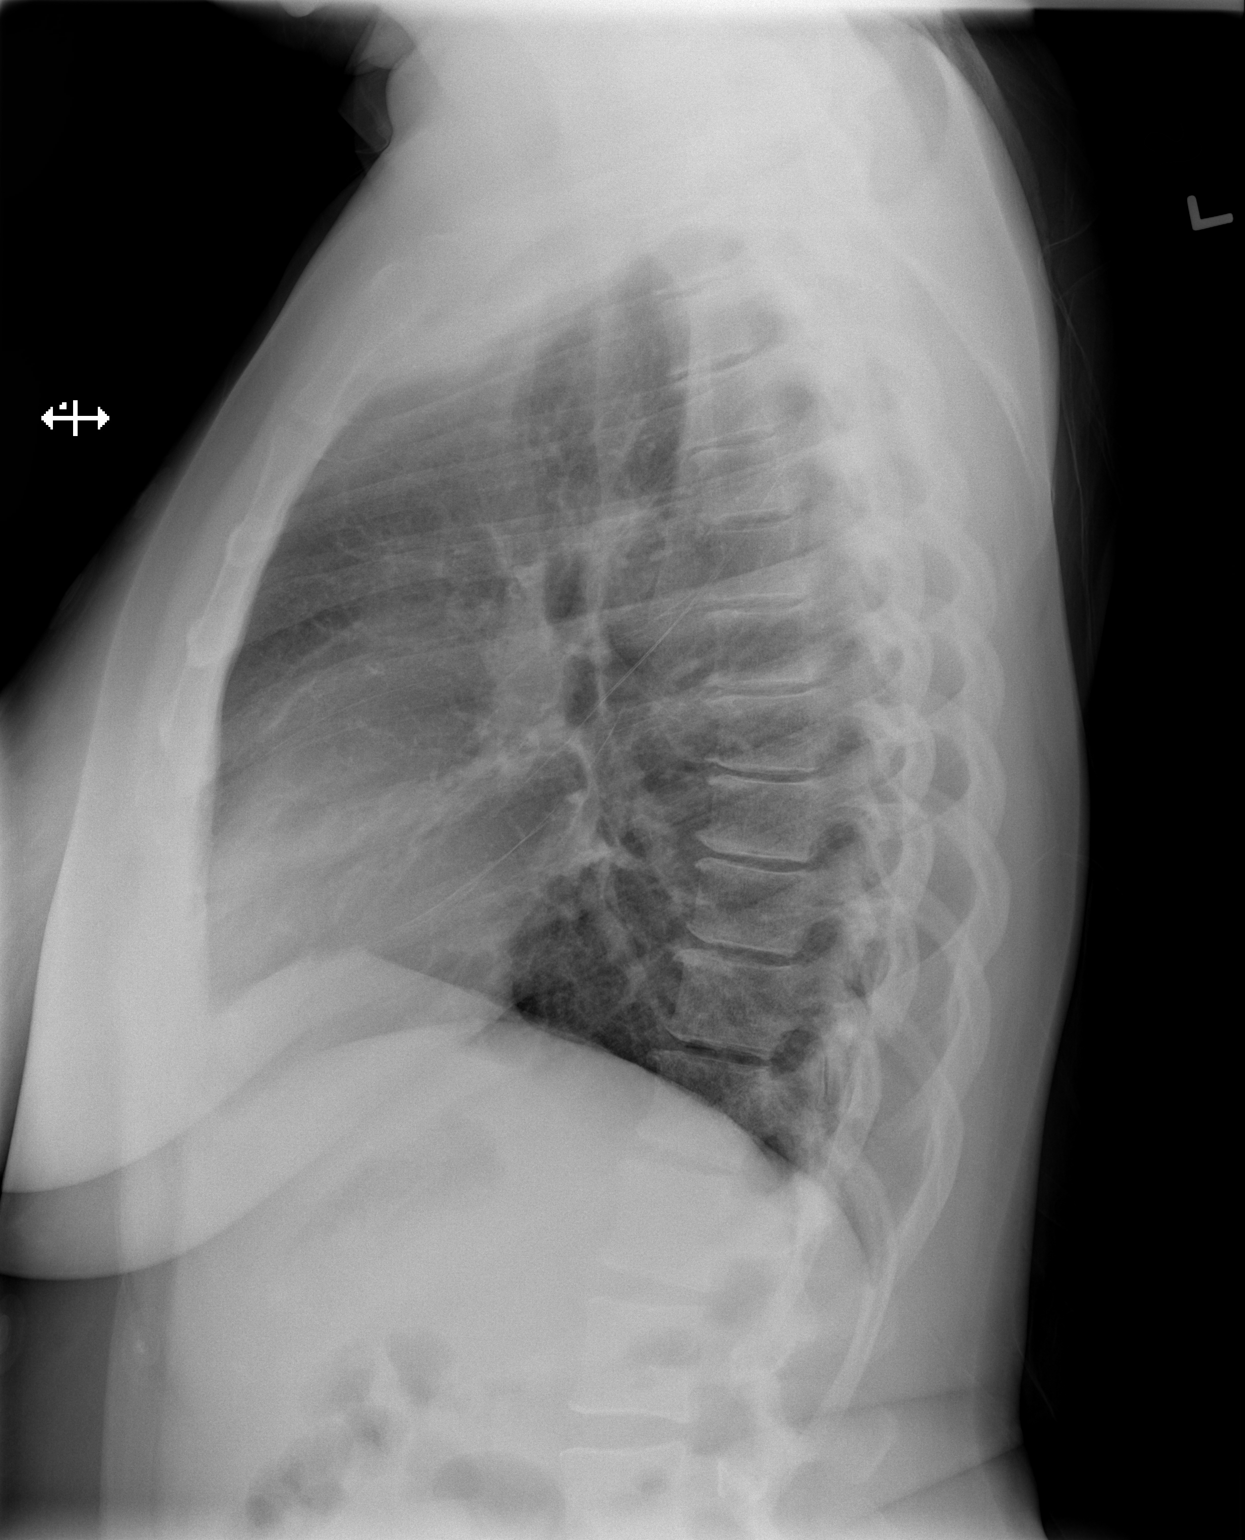

[2 of 2 positions shown; findings below may reference images not displayed]

FINDINGS: Cardiopericardial silhouette within normal limits.
Mediastinal contours normal. Trachea midline.  No airspace disease
or effusion.
IMPRESSION: No active cardiopulmonary disease.  No interval change.

## 2013-02-02 MED ORDER — ACETAMINOPHEN 325 MG PO TABS
650.0000 mg | ORAL_TABLET | Freq: Four times a day (QID) | ORAL | Status: DC | PRN
  Administered 2013-02-02 – 2013-02-03 (×2): 650 mg via ORAL
  Filled 2013-02-02 (×2): qty 2

## 2013-02-02 MED ORDER — ACETAMINOPHEN 500 MG PO TABS
1000.0000 mg | ORAL_TABLET | Freq: Once | ORAL | Status: AC
  Administered 2013-02-02: 1000 mg via ORAL
  Filled 2013-02-02: qty 2

## 2013-02-02 MED ORDER — BOOST / RESOURCE BREEZE PO LIQD
1.0000 | Freq: Two times a day (BID) | ORAL | Status: DC
  Administered 2013-02-02 – 2013-02-03 (×2): 1 via ORAL

## 2013-02-02 MED ORDER — POTASSIUM CHLORIDE IN NACL 20-0.9 MEQ/L-% IV SOLN
INTRAVENOUS | Status: DC
  Administered 2013-02-02 – 2013-02-03 (×3): via INTRAVENOUS
  Administered 2013-02-03: 150 mL/h via INTRAVENOUS
  Filled 2013-02-02 (×6): qty 1000

## 2013-02-02 MED ORDER — ALUM & MAG HYDROXIDE-SIMETH 200-200-20 MG/5ML PO SUSP
30.0000 mL | Freq: Four times a day (QID) | ORAL | Status: DC | PRN
  Filled 2013-02-02: qty 30

## 2013-02-02 MED ORDER — GI COCKTAIL ~~LOC~~
30.0000 mL | Freq: Once | ORAL | Status: AC
  Administered 2013-02-02: 30 mL via ORAL
  Filled 2013-02-02: qty 30

## 2013-02-02 MED ORDER — CIPROFLOXACIN IN D5W 400 MG/200ML IV SOLN
400.0000 mg | Freq: Once | INTRAVENOUS | Status: AC
  Administered 2013-02-02: 400 mg via INTRAVENOUS
  Filled 2013-02-02: qty 200

## 2013-02-02 MED ORDER — ONDANSETRON HCL 4 MG PO TABS: 4.0000 mg | ORAL_TABLET | Freq: Four times a day (QID) | ORAL | Status: DC | PRN

## 2013-02-02 MED ORDER — ENOXAPARIN SODIUM 40 MG/0.4ML ~~LOC~~ SOLN
40.0000 mg | SUBCUTANEOUS | Status: DC
  Administered 2013-02-02: 40 mg via SUBCUTANEOUS
  Filled 2013-02-02 (×2): qty 0.4

## 2013-02-02 MED ORDER — SODIUM CHLORIDE 0.9 % IV SOLN
500.0000 mg | Freq: Three times a day (TID) | INTRAVENOUS | Status: DC
  Administered 2013-02-02 – 2013-02-03 (×4): 500 mg via INTRAVENOUS
  Filled 2013-02-02 (×5): qty 500

## 2013-02-02 MED ORDER — ASPIRIN 81 MG PO CHEW
81.0000 mg | CHEWABLE_TABLET | Freq: Every day | ORAL | Status: DC
  Administered 2013-02-02 – 2013-02-03 (×2): 81 mg via ORAL
  Filled 2013-02-02 (×2): qty 1

## 2013-02-02 MED ORDER — ONDANSETRON HCL 4 MG/2ML IJ SOLN: 4.0000 mg | Freq: Four times a day (QID) | INTRAMUSCULAR | Status: DC | PRN

## 2013-02-02 MED ORDER — TACROLIMUS 1 MG PO CAPS: 9.0000 mg | ORAL_CAPSULE | Freq: Two times a day (BID) | ORAL | Status: DC

## 2013-02-02 MED ORDER — ACETAMINOPHEN 325 MG PO TABS: 650.0000 mg | ORAL_TABLET | Freq: Four times a day (QID) | ORAL | Status: DC | PRN

## 2013-02-02 MED ORDER — MORPHINE SULFATE 4 MG/ML IJ SOLN: 4.0000 mg | INTRAMUSCULAR | Status: DC | PRN

## 2013-02-02 MED ORDER — ASPIRIN 81 MG PO TABS: 81.0000 mg | ORAL_TABLET | Freq: Every day | ORAL | Status: DC

## 2013-02-02 MED ORDER — SODIUM CHLORIDE 0.9 % IJ SOLN
3.0000 mL | Freq: Two times a day (BID) | INTRAMUSCULAR | Status: DC
  Administered 2013-02-02 – 2013-02-03 (×2): 3 mL via INTRAVENOUS

## 2013-02-02 MED ORDER — TACROLIMUS 1 MG PO CAPS
10.0000 mg | ORAL_CAPSULE | Freq: Every day | ORAL | Status: DC
  Administered 2013-02-02 – 2013-02-03 (×2): 10 mg via ORAL
  Filled 2013-02-02 (×2): qty 10

## 2013-02-02 MED ORDER — ALBUTEROL SULFATE HFA 108 (90 BASE) MCG/ACT IN AERS
2.0000 | INHALATION_SPRAY | Freq: Four times a day (QID) | RESPIRATORY_TRACT | Status: DC | PRN
  Filled 2013-02-02: qty 6.7

## 2013-02-02 MED ORDER — TACROLIMUS 1 MG PO CAPS
9.0000 mg | ORAL_CAPSULE | Freq: Every day | ORAL | Status: DC
  Administered 2013-02-03: 9 mg via ORAL
  Filled 2013-02-02 (×2): qty 9
  Filled 2013-02-02: qty 1

## 2013-02-02 MED ORDER — MYCOPHENOLATE MOFETIL 250 MG PO CAPS
1000.0000 mg | ORAL_CAPSULE | Freq: Two times a day (BID) | ORAL | Status: DC
  Administered 2013-02-02 – 2013-02-03 (×3): 1000 mg via ORAL
  Filled 2013-02-02 (×5): qty 4

## 2013-02-02 NOTE — Progress Notes (Signed)
02/02/2013 7:26 PM  PRN APAP given at [facility] per MD order for fever of 100.9.  Temperature recheck at 1927 revealed a temp of of 99.9 orally.  Will continue to monitor patient. Jennette Banker

## 2013-02-02 NOTE — H&P (Signed)
Date: 02/02/2013               Patient Name:  Ruth Gutierrez MRN: [id number]  DOB: 1966/07/28 Age / Sex: 47 y.o., female   PCP: Placido Sou, MD         Medical Service: Internal Medicine Teaching Service         Attending Physician: Dr. Karren Cobble, MD    First Contact: Dr. Stann Mainland Pager: [phone]  Second Contact: Dr. Owens Shark Pager: [phone]       After Hours (After 5p/  First Contact Pager: [phone]  weekends / holidays): Second Contact Pager: [phone]   Chief Complaint: nausea, vomiting, diarrhea  History of Present Illness:  The patient is a 47 YO female who comes in to the hospital with several day history of nausea, vomiting, diarrhea. She has PMH of cadaveric renal transplant in 2010 (ESRD with dialysis prior to transplant), HTN, anemia. She states that about 2 days ago she started having some loose bowel movements and some nausea. She threw up about 2 times the day prior to admission and had diarrhea about 5-6 times the day prior to admission. She did not notice any dark stools or blood. No blood in the emesis. After vomiting several times she began to have a burning in her throat and chest and sought medical attention. She did not eat any new or unusual foods. She states her 55 year old granddaughter had diarrhea recently but no vomiting. She has been having chills at home but no fevers. She did have a fever in the ED. She states recently (3 weeks ago) she was treated with flagyl for a urinary infection and she is not sure if the symptoms have fully resolved yet. She has not noticed any new rashes and has not been camping. She is not having any new numbness, weakness, tingling although overall she feels fatigued. She states the day prior to admission she was also feeling slightly lightheaded and that feeling is still present in the ED. She has not had any new cough although she states she has sinus problems chronically. She is feeling slightly SOB after the vomiting but states  that is resolved now. She has been unable to tolerate much food in the last several days but has been able to keep down minimal liquids.   Meds: Current Facility-Administered Medications  Medication Dose Route Frequency Provider Last Rate Last Dose  . morphine 4 MG/ML injection 4 mg  4 mg Intravenous Q1H PRN Alfonzo Feller, DO       Current Outpatient Prescriptions  Medication Sig Dispense Refill  . amLODipine (NORVASC) 10 MG tablet Take 10 mg by mouth daily.       Marland Kitchen aspirin 81 MG tablet Take 81 mg by mouth daily.        Marland Kitchen atenolol (TENORMIN) 50 MG tablet Take 50 mg by mouth 2 (two) times daily.       Marland Kitchen docusate sodium (COLACE) 100 MG capsule Take 100 mg by mouth 2 (two) times daily as needed.       . ferrous sulfate 325 (65 FE) MG tablet 325 mg. Take 2 tabs by mouth twice daily.       . furosemide (LASIX) 40 MG tablet Take 40 mg by mouth 2 (two) times daily.       Marland Kitchen KLOR-CON M20 20 MEQ tablet Take 20 mEq by mouth 2 (two) times daily.       . mycophenolate (CELLCEPT) 250 MG capsule 250  mg. Take 4 capsules by mouth twice daily.       . tacrolimus (PROGRAF) 1 MG capsule Take 9-10 mg by mouth 2 (two) times daily. Take 10 capsules in the morning and takes 9 capsules at night        Allergies: Allergies as of 02/02/2013 - Review Complete 02/02/2013  Allergen Reaction Noted  . Penicillins Other (See Comments) 02/02/2013   Past Medical History  Diagnosis Date  . Deceased-donor kidney transplant     April 2010  . Anemia of chronic disease   . ESRD (end stage renal disease)     s/p transplant creatinine baseline 1.1  . History of hyperparathyroidism   . Hypertension    Past Surgical History  Procedure Laterality Date  . Kidney transplant  11/2008    [facility])  . Av fistula placement     Family History  Problem Relation Age of Onset  . Diabetes    . Hypertension     History   Social History  . Marital Status: Single    Spouse Name: N/A    Number of Children: N/A    . Years of Education: N/A   Occupational History  . Not on file.   Social History Main Topics  . Smoking status: Current Every Day Smoker -- 0.50 packs/day for 27 years    Types: Cigarettes  . Smokeless tobacco: Not on file     Comment: 3- 4 cigarettes a day  . Alcohol Use: Yes     Comment: occasional drinker  . Drug Use: No  . Sexually Active: Not on file   Other Topics Concern  . Not on file   Social History Narrative   Lives alone, not working, sister helps her with medications etc.    Review of Systems: A comprehensive 10 point review of systems was performed and the pertinent positives and negatives are mentioned in the HPI and otherwise was negative.   Physical Exam: Blood pressure 119/67, pulse 102, temperature 101.3 F (38.5 C), temperature source Oral, resp. rate 18, last menstrual period 01/27/2013, SpO2 99.00%. General: resting in bed, in mild distress HEENT: PERRL, EOMI, no scleral icterus, dry mucous membranes, no tenderness over the sinuses above the orbits, below the orbits, no inflammation of the tympanic membranes, no throat redness or irritation or discharge Cardiac: S1 S2 heard, no murmurs detected Pulm: clear to auscultation bilaterally, moving normal volumes of air, some scattered wheezes that clear with cough Abd: soft, nontender, mildly distended, BS present, some flank tenderness on the right Ext: warm and well perfused, no pedal edema, no calf pain or tenderness or swelling Neuro: alert and oriented X3, cranial nerves II-XII grossly intact   Lab results: Basic Metabolic Panel:  Recent Labs  02/02/13 0249  NA 134*  K 3.1*  CL 96  CO2 27  GLUCOSE 80  BUN 15  CREATININE 1.52*  CALCIUM 9.3   Liver Function Tests:  Recent Labs  02/02/13 0419  AST 22  ALT 18  ALKPHOS 60  BILITOT 0.2*  PROT 7.1  ALBUMIN 3.6    Recent Labs  02/02/13 0419  LIPASE 30   CBC:  Recent Labs  02/02/13 0249  WBC 9.8  HGB 8.2*  HCT 26.5*  MCV  77.0*  PLT 258   BNP:  Recent Labs  02/02/13 0249  PROBNP 118.9   POC troponin: 0.00  Urinalysis:  Recent Labs  02/02/13 0538  COLORURINE YELLOW  LABSPEC 1.006  PHURINE 6.5  GLUCOSEU NEGATIVE  HGBUR LARGE*  BILIRUBINUR NEGATIVE  KETONESUR NEGATIVE  PROTEINUR 30*  UROBILINOGEN 0.2  NITRITE NEGATIVE  LEUKOCYTESUR LARGE*   Imaging results:  Dg Chest 2 View  02/02/2013   *RADIOLOGY REPORT*  Clinical Data: Cough.  Chest pressure.  Short of breath for 2 weeks.  CHEST - 2 VIEW  Comparison: 04/24/2011.  Findings:  Cardiopericardial silhouette within normal limits. Mediastinal contours normal. Trachea midline.  No airspace disease or effusion.  IMPRESSION: No active cardiopulmonary disease.  No interval change.   Original Report Authenticated By: Dereck Ligas, M.D.    Other results: EKG: normal EKG, normal sinus rhythm, unchanged from previous tracings.  Assessment & Plan by Problem:  Sepsis due to undetermined organism - Patient was febrile in the ED and relatively hypotensive (given that she is normally hypertensive). She was not given adequate fluid resuscitation in the ED and therefore more will be given. Source for fever is more likely to be urinary versus viral gastroenteritis. There is history of sick contact prior to onset of symptoms. However, given her immunocompromised state will rule out more serious bacterial diarrheal infection. Urinary source could be possible however the patient is not actively complaining of symptoms. Lactic acid 1.9. The ED covered her with ciprofloxacin under the impression that she had recent episode of urinary infection and bacteremia with Klebsiella which was susceptible however on chart review appears this episode was about 2 years ago. Will broaden coverage to imipenen (given penicillin allergy) until urine culture returns for better abdominal coverage (given GI/urinary are only source of symptoms).  -Blood cultures times 2 -Urine culture -C  dif given recent antibiotics (although does not sound typical) -Imipenen until cultures return or source is made more evident -NS KCl 20 mEq @ 150 cc/hr -Stool pathogen panel by PCR to evaluate for campylobacter,  E coli (0157, ETEC, STEC), salmonella, shigella, norovirus, rotavirus, giardia, cryptosporidium -CXR normal making respiratory source less likely  Acute renal failure - Concerning given renal transplant. Will check fractional excretion of urea although likely to be pre-renal etiology. Will give fluids and monitor. Will check magnesium and phosphate.  -Fluids at 150 cc/hr  Chest pain - The patient was having burning sensation after vomiting and seems most likely to be esophageal in origin. Symptoms resolved with GI cocktail in the ED. No other cardiac risk factors and initial troponin negative without EKG changes. Will monitor closely for recurrence. TIMI score is 1 for aspirin usage.   Diarrhea/nausea/vomiting - Most likely etiology would be viral infection given sick exposure however given immunocompromised state will check C dif and stool pathogen panel. Will not start anti-diarrheal until C dif test returns. Supportive care at this time with fluids and zofran for nausea.  -Zofran for nausea -Clear liquid diet  S/P kidney transplant - Will start back her cell-cept and prograf. Likely her acute kidney injury is due to dehydration versus rejection however will watch closely.  -Follow BMP -Check Mag, Phos -Continue cellcept, prograf  Iron deficiency anemia - Patient states that she is supposed to be taking iron pills at home however does not reliably take them. She did just finish her menses which may account for the lower value from 1 year ago. Current Hg 8.2 and would expect some dilution with hydration as she is likely hemoconcentrated. Last known baseline for Hg appears to be around 10.  -Will monitor for signs of bleeding -Watch CBC -Transfuse for Hg < 7  DVT ppx - Lovenox [place]  daily  Dispo: Disposition is deferred  at this time, awaiting improvement of current medical problems. Anticipated discharge in approximately 2-3 day(s).   The patient does have a current PCP Placido Sou, MD) and does not need an [facility] hospital follow-up appointment after discharge.  The patient does not have transportation limitations that hinder transportation to clinic appointments.  Signed: Olga Millers, MD 02/02/2013, 9:15 AM

## 2013-02-02 NOTE — Progress Notes (Signed)
ANTIBIOTIC CONSULT NOTE - INITIAL  Pharmacy Consult for primaxin Indication: empiric  Allergies  Allergen Reactions  . Penicillins Other (See Comments)    Does not know reaction    weight= 81 kg, height 5'7 (per patient)  Vital Signs: Temp: 101.3 F (38.5 C) (07/02 0530) Temp src: Oral (07/02 0224) BP: 121/68 mmHg (07/02 0930) Pulse Rate: 84 (07/02 0930) Intake/Output from previous day:   Intake/Output from this shift:    Labs:  Recent Labs  02/02/13 0249  WBC 9.8  HGB 8.2*  PLT 258  CREATININE 1.52*   The CrCl is unknown because both a height and weight (above a minimum accepted value) are required for this calculation. No results found for this basename: VANCOTROUGH, VANCOPEAK, VANCORANDOM, GENTTROUGH, GENTPEAK, GENTRANDOM, TOBRATROUGH, TOBRAPEAK, TOBRARND, AMIKACINPEAK, AMIKACINTROU, AMIKACIN,  in the last 72 hours   Microbiology: No results found for this or any previous visit (from the past 720 hour(s)).  Medical History: Past Medical History  Diagnosis Date  . Deceased-donor kidney transplant     April 2010  . Anemia of chronic disease   . ESRD (end stage renal disease)     s/p transplant creatinine baseline 1.1  . History of hyperparathyroidism   . Hypertension     Assessment: Patient is a 47 y.o F s/p renal transplant in 2010 admitted to [facility] with c/o n/v and diarrhea.  To start abx for empiric coverage.  Patient received ciprofloxacin in the ED at 7 AM today.  Scr 1.52 (est crcl~ 60).  Patient has PCN listed as an allergy but had reaction to this a long time ago and doesn't remember the reaction to PCN.  Will monitor closely with first dose.   Plan:  1) primaxin 500mg  IV q8h  Marvel Mcphillips P 02/02/2013,10:11 AM

## 2013-02-02 NOTE — ED Notes (Signed)
PT. REPORTS MID CHEST PAIN WITH NAUSEA AND VOMITTING , SLIGHT SOB WITH OCCASIONAL DIARRHEA.

## 2013-02-02 NOTE — Progress Notes (Signed)
02/02/2013 1:34 PM  Pt temp around 1300 at 100.6 orally.  Dr. Stann Mainland notified, no further orders received at this time.  Will continue to monitor patient. Jennette Banker

## 2013-02-02 NOTE — Progress Notes (Addendum)
02/02/2013 5:47 PM Most recent temperature check was at 100.9 orally, up from 100.6 earlier.  MD notified, orders received.  Will continue to monitor patient. Jennette Banker

## 2013-02-02 NOTE — ED Provider Notes (Signed)
History    CSN: [id number] Arrival date & time 02/02/13  0204  First MD Initiated Contact with Patient 02/02/13 0321     Chief Complaint  Patient presents with  . Chest Pain  . Nausea  . Emesis  . Diarrhea  . Fever    HPI Pt was seen at 0400.  Per pt and her family, c/o gradual onset and persistence of multiple intermittent episodes of N/V/D that began 2 days ago. Describes the stools as "watery" and have been improving since onset.  Has been associated with mid-sternal "burning" chest pain which radiates into her throat. States the CP began after N/V, and has been constant for the past 2 days. Pt also endorses subjective home fevers/chills, as well as generalized fatigue/weaknss and poor PO intake due to symptoms. States she was exposed to a family member with similar symptoms. Denies abd pain, no cough/SOB, no palpitations, no back/flank pain, no black or blood in stools or emesis, no recent travel.    Past Medical History  Diagnosis Date  . Deceased-donor kidney transplant     April 2010  . Anemia of chronic disease   . ESRD (end stage renal disease)     s/p transplant creatinine baseline 1.1  . History of hyperparathyroidism   . Hypertension    Past Surgical History  Procedure Laterality Date  . Kidney transplant  11/2008    [facility]  . Av fistula placement     Family History  Problem Relation Age of Onset  . Diabetes    . Hypertension     History  Substance Use Topics  . Smoking status: Current Every Day Smoker -- 0.50 packs/day for 27 years    Types: Cigarettes  . Smokeless tobacco: Not on file     Comment: 3- 4 cigarettes a day  . Alcohol Use: Yes     Comment: occasional drinker    Review of Systems ROS: Statement: All systems negative except as marked or noted in the HPI; Constitutional: +subjective fever and chills, generalized weakness/fatigue. ; ; Eyes: Negative for eye pain, redness and discharge. ; ; ENMT: Negative for ear pain, hoarseness, nasal  congestion, sinus pressure and sore throat. ; ; Cardiovascular: +CP. Negative for palpitations, diaphoresis, dyspnea and peripheral edema. ; ; Respiratory: Negative for cough, wheezing and stridor. ; ; Gastrointestinal: +N/V/D. Negative for abdominal pain, blood in stool, hematemesis, jaundice and rectal bleeding. . ; ; Genitourinary: Negative for dysuria, flank pain and hematuria. ; ; Musculoskeletal: Negative for back pain and neck pain. Negative for swelling and trauma.; ; Skin: Negative for pruritus, rash, abrasions, blisters, bruising and skin lesion.; ; Neuro: Negative for headache, lightheadedness and neck stiffness. Negative for altered level of consciousness , altered mental status, extremity weakness, paresthesias, involuntary movement, seizure and syncope.      Allergies  Penicillins  Home Medications   Current Outpatient Rx  Name  Route  Sig  Dispense  Refill  . amLODipine (NORVASC) 10 MG tablet   Oral   Take 10 mg by mouth daily.          Marland Kitchen aspirin 81 MG tablet   Oral   Take 81 mg by mouth daily.           Marland Kitchen atenolol (TENORMIN) 50 MG tablet   Oral   Take 50 mg by mouth 2 (two) times daily.          Marland Kitchen docusate sodium (COLACE) 100 MG capsule   Oral   Take 100  mg by mouth 2 (two) times daily as needed.          . ferrous sulfate 325 (65 FE) MG tablet      325 mg. Take 2 tabs by mouth twice daily.          . furosemide (LASIX) 40 MG tablet   Oral   Take 40 mg by mouth 2 (two) times daily.          Marland Kitchen KLOR-CON M20 20 MEQ tablet   Oral   Take 20 mEq by mouth 2 (two) times daily.          . mycophenolate (CELLCEPT) 250 MG capsule      250 mg. Take 4 capsules by mouth twice daily.          . tacrolimus (PROGRAF) 1 MG capsule   Oral   Take 9-10 mg by mouth 2 (two) times daily. Take 10 capsules in the morning and takes 9 capsules at night          BP 123/56  Pulse 90  Temp(Src) 101.3 F (38.5 C) (Oral)  Resp 16  SpO2 100%  LMP  01/27/2013  Physical Exam 0405: Physical examination:  Nursing notes reviewed; Vital signs and O2 SAT reviewed;  Constitutional: Well developed, Well nourished, Well hydrated, In no acute distress; Head:  Normocephalic, atraumatic; Eyes: EOMI, PERRL, No scleral icterus; ENMT: Mouth and pharynx normal, Mucous membranes moist; Neck: Supple, Full range of motion, No lymphadenopathy; Cardiovascular: Regular rate and rhythm, No murmur, rub, or gallop; Respiratory: Breath sounds clear & equal bilaterally, No rales, rhonchi, wheezes.  Speaking full sentences with ease, Normal respiratory effort/excursion; Chest: Nontender, Movement normal; Abdomen: Soft. Nontender, including over transplanted kidney site RLQ. Nondistended, Normal bowel sounds; Genitourinary: No CVA tenderness; Extremities: Pulses normal, No tenderness, No edema, No calf edema or asymmetry.; Neuro: AA&Ox3, Major CN grossly intact.  Speech clear. No gross focal motor or sensory deficits in extremities.; Skin: Color normal, Warm, Dry.   ED Course  Procedures   MDM  MDM Reviewed: previous chart, nursing note and vitals Reviewed previous: labs and ECG Interpretation: labs, ECG and x-ray    Date: 02/02/2013  Rate: 88  Rhythm: normal sinus rhythm  QRS Axis: normal  Intervals: QT prolonged  ST/T Wave abnormalities: nonspecific ST/T changes  Conduction Disutrbances:none  Narrative Interpretation:   Old EKG Reviewed: unchanged; no significant changes from previous EKG dated 03/10/2011.   Results for orders placed during the hospital encounter of 02/02/13  CBC      Result Value Range   WBC 9.8  4.0 - 10.5 K/uL   RBC 3.44 (*) 3.87 - 5.11 MIL/uL   Hemoglobin 8.2 (*) 12.0 - 15.0 g/dL   HCT 26.5 (*) 36.0 - 46.0 %   MCV 77.0 (*) 78.0 - 100.0 fL   MCH 23.8 (*) 26.0 - 34.0 pg   MCHC 30.9  30.0 - 36.0 g/dL   RDW 17.4 (*) 11.5 - 15.5 %   Platelets 258  150 - 400 K/uL  BASIC METABOLIC PANEL      Result Value Range   Sodium 134 (*) 135 -  145 mEq/L   Potassium 3.1 (*) 3.5 - 5.1 mEq/L   Chloride 96  96 - 112 mEq/L   CO2 27  19 - 32 mEq/L   Glucose, Bld 80  70 - 99 mg/dL   BUN 15  6 - 23 mg/dL   Creatinine, Ser 1.52 (*) 0.50 - 1.10 mg/dL   Calcium 9.3  8.4 - 10.5 mg/dL   GFR calc non Af Amer 40 (*) >90 mL/min   GFR calc Af Amer 47 (*) >90 mL/min  PRO B NATRIURETIC PEPTIDE      Result Value Range   Pro B Natriuretic peptide (BNP) 118.9  0 - 125 pg/mL  HEPATIC FUNCTION PANEL      Result Value Range   Total Protein 7.1  6.0 - 8.3 g/dL   Albumin 3.6  3.5 - 5.2 g/dL   AST 22  0 - 37 U/L   ALT 18  0 - 35 U/L   Alkaline Phosphatase 60  39 - 117 U/L   Total Bilirubin 0.2 (*) 0.3 - 1.2 mg/dL   Bilirubin, Direct <0.1  0.0 - 0.3 mg/dL   Indirect Bilirubin NOT CALCULATED  0.3 - 0.9 mg/dL  LIPASE, BLOOD      Result Value Range   Lipase 30  11 - 59 U/L  URINALYSIS, ROUTINE W REFLEX MICROSCOPIC      Result Value Range   Color, Urine YELLOW  YELLOW   APPearance CLOUDY (*) CLEAR   Specific Gravity, Urine 1.006  1.005 - 1.030   pH 6.5  5.0 - 8.0   Glucose, UA NEGATIVE  NEGATIVE mg/dL   Hgb urine dipstick LARGE (*) NEGATIVE   Bilirubin Urine NEGATIVE  NEGATIVE   Ketones, ur NEGATIVE  NEGATIVE mg/dL   Protein, ur 30 (*) NEGATIVE mg/dL   Urobilinogen, UA 0.2  0.0 - 1.0 mg/dL   Nitrite NEGATIVE  NEGATIVE   Leukocytes, UA LARGE (*) NEGATIVE  URINE MICROSCOPIC-ADD ON      Result Value Range   Squamous Epithelial / LPF FEW (*) RARE   WBC, UA 21-50  <3 WBC/hpf   RBC / HPF 0-2  <3 RBC/hpf   Bacteria, UA FEW (*) RARE  POCT PREGNANCY, URINE      Result Value Range   Preg Test, Ur NEGATIVE  NEGATIVE  POCT I-STAT TROPONIN I      Result Value Range   Troponin i, poc 0.00  0.00 - 0.08 ng/mL   Comment 3            Dg Chest 2 View 02/02/2013   *RADIOLOGY REPORT*  Clinical Data: Cough.  Chest pressure.  Short of breath for 2 weeks.  CHEST - 2 VIEW  Comparison: 04/24/2011.  Findings:  Cardiopericardial silhouette within normal limits.  Mediastinal contours normal. Trachea midline.  No airspace disease or effusion.  IMPRESSION: No active cardiopulmonary disease.  No interval change.   Original Report Authenticated By: Dereck Ligas, M.D.   Results for TAELER, ALMONOR (MRN [id number]) as of 02/02/2013 06:51  Ref. Range 04/25/2011 05:40 04/26/2011 05:00 04/27/2011 06:30 04/28/2011 06:47 02/02/2013 02:49  Hemoglobin Latest Range: 12.0-15.0 g/dL 10.9 (L) 10.6 (L) 10.5 (L) 10.7 (L) 8.2 (L)  HCT Latest Range: 36.0-46.0 % 33.1 (L) 32.2 (L) 31.7 (L) 32.9 (L) 26.5 (L)   Results for CLOEY, HESSMAN (MRN [id number]) as of 02/02/2013 06:51  Ref. Range 04/25/2011 05:40 04/26/2011 05:00 04/27/2011 06:30 04/28/2011 06:47 05/26/2011 14:14 02/02/2013 02:49  BUN Latest Range: 6-23 mg/dL 10 10 8 8 13 15   Creatinine Latest Range: 0.50-1.10 mg/dL 1.40 (H) 1.19 (H) 0.94 0.88 1.00 1.52 (H)     0640:   Pt with fever to 101 while in the ED; APAP given. +UTI, UC and BC pending. Lactic acid 1.9. No hypotension while in the ED. Endorses approx 3 weeks ago she was tx for UTI and now states she continues  to have mild dysuria. Pt has previous hx of gram negative sepsis due to UTI; will dose IV cipro (previous BC/UC sensitive to same).  BUN/Cr elevated from previous.  Dx and testing d/w pt and family.  Questions answered.  Verb understanding, agreeable to admit. T/C to [facility] Resident, case discussed, including:  HPI, pertinent PM/SHx, VS/PE, dx testing, ED course and treatment:  Agreeable to admit, requests they will come to ED for eval.         Alfonzo Feller, DO 02/03/13 2201

## 2013-02-02 NOTE — Progress Notes (Signed)
INITIAL NUTRITION ASSESSMENT  DOCUMENTATION CODES Per approved criteria  -Not Applicable   INTERVENTION: 1. Resource Breeze po BID, each supplement provides 250 kcal and 9 grams of protein.   NUTRITION DIAGNOSIS: Inadequate oral intake related to limited ability to eat  as evidenced by Clear Liquid diet.   Goal: Diet advance to meet >/=90% estimated nutrition needs.   Monitor:  PO intake, weight trends, labs, I/O's  Reason for Assessment: Malnutrition Screening Tool  47 y.o. female  Admitting Dx: Sepsis due to undetermined organism, with acute renal failure  ASSESSMENT: Pt with hx of kidney transplant, admitted with vomiting, diarrhea, acute renal failure, and sepsis. Being worked up for urinary vs gastrointestinal source of fever. Pt is immunocompromised and being worked up for wide variety of bacterial infections.   Pt reports she has been eating normally, but over the past few months has lost about 7-10 lbs. Pt [place] unsure of her usual weight. Will add [employer].   Height: Ht Readings from Last 1 Encounters:  02/02/13 5\' 7"  (1.702 m)    Weight: Wt Readings from Last 1 Encounters:  02/02/13 179 lb 9.6 oz (81.466 kg)    Ideal Body Weight: 135 lbs   % Ideal Body Weight: 132%  Wt Readings from Last 10 Encounters:  02/02/13 179 lb 9.6 oz (81.466 kg)  05/26/11 194 lb 6.4 oz (88.179 kg)  04/04/11 201 lb 12.8 oz (91.536 kg)    Usual Body Weight: pt unsure of recent weight  % Usual Body Weight: --  BMI:  Body mass index is 28.12 kg/(m^2). Overweight   Estimated Nutritional Needs: Kcal: 2000-2200 Protein: 64-81 gm Fluid: 2-2.2 L   Skin: intact   Diet Order: Clear Liquid  EDUCATION NEEDS: -No education needs identified at this time   Intake/Output Summary (Last 24 hours) at 02/02/13 1444 Last data filed at 02/02/13 1300  Gross per 24 hour  Intake    363 ml  Output    600 ml  Net   -237 ml    Last BM: PTA    Labs:   Recent Labs Lab  02/02/13 0249 02/02/13 0429  NA 134*  --   K 3.1*  --   CL 96  --   CO2 27  --   BUN 15  --   CREATININE 1.52*  --   CALCIUM 9.3  --   MG  --  1.6  PHOS  --  3.5  GLUCOSE 80  --     CBG (last 3)  No results found for this basename: GLUCAP,  in the last 72 hours  Scheduled Meds: . aspirin  81 mg Oral Daily  . enoxaparin (LOVENOX) injection  40 mg Subcutaneous Q24H  . imipenem-cilastatin  500 mg Intravenous Q8H  . mycophenolate  1,000 mg Oral BID  . sodium chloride  3 mL Intravenous Q12H  . tacrolimus  10 mg Oral Daily  . tacrolimus  9 mg Oral QHS    Continuous Infusions: . 0.9 % NaCl with KCl 20 mEq / L 150 mL/hr at 02/02/13 1052    Past Medical History  Diagnosis Date  . Deceased-donor kidney transplant     April 2010  . Anemia of chronic disease   . ESRD (end stage renal disease)     s/p transplant creatinine baseline 1.1  . History of hyperparathyroidism   . Hypertension     Past Surgical History  Procedure Laterality Date  . Kidney transplant  11/2008    [facility])  .  Av fistula placement      Orson Slick RD, LDN Pager [phone] After Hours pager [phone]

## 2013-02-02 NOTE — ED Notes (Signed)
Tried to call report to [phone].  Spoke with Baker Janus, she advised all RN's in a meeting to try back to give report.

## 2013-02-02 NOTE — Progress Notes (Signed)
02/02/2013 11:01 AM  Pt arrived to unit from the ED around 1000.  Pt fully alert and oriented, c/o 9/10 pressure mid chest--same pain she experienced in the ED that was relieved by GI cocktail.  MD notified--orders for another GI cocktail received.  Pt vitals stable, except temp of 99.9 orally, which is down from the temp in the ED.  Skin intact. Pt from home with daughter, who is at the bedside currently.  Pt placed on tele.  Pt is a low fall risk per protocol, and was educated on her fall risk score and the necessary interventions, to which she verbalized understanding.  Pt signed falls safety contract.  Pt was also instructed on how to utilize the call bell and was oriented to the room/unit, to which she verbalized understanding.  Pt placed on enteric precautions per protocol as well.  Full assessment to EPIC.  Will continue to monitor patient. Jennette Banker

## 2013-02-03 ENCOUNTER — Encounter (HOSPITAL_COMMUNITY): Payer: Self-pay | Admitting: Internal Medicine

## 2013-02-03 DIAGNOSIS — R509 Fever, unspecified: Secondary | ICD-10-CM

## 2013-02-03 LAB — COMPREHENSIVE METABOLIC PANEL
BUN: 8 mg/dL (ref 6–23)
CO2: 20 mEq/L (ref 19–32)
Calcium: 9.1 mg/dL (ref 8.4–10.5)
Chloride: 107 mEq/L (ref 96–112)
Creatinine, Ser: 1.05 mg/dL (ref 0.50–1.10)
GFR calc non Af Amer: 63 mL/min — ABNORMAL LOW (ref >90)
Total Bilirubin: 0.2 mg/dL — ABNORMAL LOW (ref 0.3–1.2)

## 2013-02-03 LAB — CBC
Platelets: 226 10*3/uL (ref 150–400)
RBC: 3.21 MIL/uL — ABNORMAL LOW (ref 3.87–5.11)
WBC: 9.5 10*3/uL (ref 4.0–10.5)

## 2013-02-03 LAB — IRON AND TIBC: Iron: <10 ug/dL — ABNORMAL LOW (ref 42–135)

## 2013-02-03 LAB — FOLATE: Folate: 8.8 ng/mL

## 2013-02-03 LAB — TSH: TSH: 0.009 u[IU]/mL — ABNORMAL LOW (ref 0.350–4.500)

## 2013-02-03 LAB — VITAMIN B12: Vitamin B-12: 537 pg/mL (ref 211–911)

## 2013-02-03 MED ORDER — CIPROFLOXACIN HCL 500 MG PO TABS
500.0000 mg | ORAL_TABLET | Freq: Two times a day (BID) | ORAL | Status: DC
  Administered 2013-02-03: 500 mg via ORAL
  Filled 2013-02-03 (×3): qty 1

## 2013-02-03 MED ORDER — CIPROFLOXACIN HCL 500 MG PO TABS: 500.0000 mg | ORAL_TABLET | Freq: Two times a day (BID) | ORAL | Status: DC

## 2013-02-03 NOTE — H&P (Signed)
I saw and evaluated the patient. I reviewed the resident's note and confirmed the resident's findings.  I agree with the assessment and plan as documented in the resident's note.  Briefly, the patient appeared to develop a gastroenteritis associated with nausea, vomiting, and diarrhea after exposure to a sick contact. Of note, she is immunocompromised given her antirejection regimen for her cadaveric renal transplantation that was required secondary to hypertensive nephrosclerosis. She was aggressively hydrated and treated symptomatically. Her diarrhea and vomiting have resolved and her nausea is now only mild. She is tolerating a liquid diet and feels she may be ready for a more advanced diet. If she tolerates the regular diet well and is not orthostatic on examination she is stable for discharge home given the resolution of her symptoms and return of her renal function to baseline.

## 2013-02-03 NOTE — Discharge Summary (Signed)
Name: Ruth Gutierrez MRN: [id number] DOB: 06/14/1966 47 y.o. PCP: Placido Sou, MD  Date of Admission: 02/02/2013  2:40 AM Date of Discharge: 02/03/2013 Attending Physician: Karren Cobble, MD  Discharge Diagnosis: 1. Pyelonephritis - treated with imipenem -> cipro 2. Viral Gastroenteritis - resolved 3. Iron deficiency anemia - started PO iron supplementation 4. Acute Kidney Injury - resolved 5. S/p Kidney Transplant - Transplant at [facility] 11/2008   Discharge Medications:   Medication List         amLODipine 10 MG tablet  Commonly known as:  NORVASC  Take 10 mg by mouth daily.     aspirin 81 MG tablet  Take 81 mg by mouth daily.     atenolol 50 MG tablet  Commonly known as:  TENORMIN  Take 50 mg by mouth 2 (two) times daily.     ciprofloxacin 500 MG tablet  Commonly known as:  CIPRO  Take 1 tablet (500 mg total) by mouth 2 (two) times daily.     docusate sodium 100 MG capsule  Commonly known as:  COLACE  Take 100 mg by mouth 2 (two) times daily as needed.     ferrous sulfate 325 (65 FE) MG tablet  325 mg. Take 2 tabs by mouth twice daily.     furosemide 40 MG tablet  Commonly known as:  LASIX  Take 40 mg by mouth 2 (two) times daily.     KLOR-CON M20 20 MEQ tablet  Generic drug:  potassium chloride SA  Take 20 mEq by mouth 2 (two) times daily.     mycophenolate 250 MG capsule  Commonly known as:  CELLCEPT  250 mg. Take 4 capsules by mouth twice daily.     tacrolimus 1 MG capsule  Commonly known as:  PROGRAF  Take 9-10 mg by mouth 2 (two) times daily. Take 10 capsules in the morning and takes 9 capsules at night        Disposition and follow-up:   Ruth Gutierrez was discharged from [facility] in Stable condition.  At the hospital follow up visit please address:  1.  Please follow-up urine culture sensitivities, to ensure E Coli is sensitive to Cipro.  2.  Labs / imaging needed at time of follow-up: BMET (to ensure  full resolution of AKI)  3.  Pending labs/ test needing follow-up: Urine culture sensitivities  Follow-up Appointments:     Follow-up Information   Follow up with DETERDING,JAMES L, MD. Schedule an appointment as soon as possible for a visit on 02/17/2013.   Contact information:   [place] [address] [phone]       Discharge Instructions:   Consultations: None  Procedures Performed:  Dg Chest 2 View  02/02/2013   *RADIOLOGY REPORT*  Clinical Data: Cough.  Chest pressure.  Short of breath for 2 weeks.  CHEST - 2 VIEW  Comparison: 04/24/2011.  Findings:  Cardiopericardial silhouette within normal limits. Mediastinal contours normal. Trachea midline.  No airspace disease or effusion.  IMPRESSION: No active cardiopulmonary disease.  No interval change.   Original Report Authenticated By: Dereck Ligas, M.D.    Admission HPI:  The patient is a 47 YO female who comes in to the hospital with several day history of nausea, vomiting, diarrhea. She has PMH of cadaveric renal transplant in 2010 (ESRD with dialysis prior to transplant), HTN, anemia. She states that about 2 days ago she started having some loose bowel movements and  some nausea. She threw up about 2 times the day prior to admission and had diarrhea about 5-6 times the day prior to admission. She did not notice any dark stools or blood. No blood in the emesis. After vomiting several times she began to have a burning in her throat and chest and sought medical attention. She did not eat any new or unusual foods. She states her 43 year old granddaughter had diarrhea recently but no vomiting. She has been having chills at home but no fevers. She did have a fever in the ED. She states recently (3 weeks ago) she was treated with flagyl for a urinary infection and she is not sure if the symptoms have fully resolved yet. She has not noticed any new rashes and has not been camping. She is not having  any new numbness, weakness, tingling although overall she feels fatigued. She states the day prior to admission she was also feeling slightly lightheaded and that feeling is still present in the ED. She has not had any new cough although she states she has sinus problems chronically. She is feeling slightly SOB after the vomiting but states that is resolved now. She has been unable to tolerate much food in the last several days but has been able to keep down minimal liquids.   Hospital Course by problem list: 1. Pyelonephritis - The patient presented with fever, nausea, and vomiting, with UA showing pyuria, and urine culture later confirming >100K colonies of E Coli.  The patient was initially treated with imipenem, with significant improvement after 24 hours.  The patient was transitioned to PO cipro, to complete a 14-day total course of antibiotics.  2. Viral Gastroenteritis - The patient noted symptoms of nausea, vomiting, and diarrhea prior to admission.  Symptoms resolves shortly after admission.  Stool studies, including c diff, were ordered on admission, but diarrhea resolved before a specimen could be collected.  3. Iron deficiency anemia - The patient was noted to have acute on chronic microcytic anemia.  Iron studies were obtained, which revealed a ferritin of 38, with an iron < 10.  The etiology of the patient's anemia is likely due to iron deficiency anemia.  The patient was started on PO iron supplementation, to follow-up in the outpatient setting.  4. Acute Kidney Injury - The patient's initial creatinine on admission was 1.52, elevated from her baseline around 1.0, thought to be due to prerenal azotemia, secondary to viral gastroenteritis with volume depletion.  The patient was given IV fluids, and by the day of discharge, creatinine had downtrended to 1.05.   Discharge Vitals:   BP 148/88  Pulse 95  Temp(Src) 100 F (37.8 C) (Oral)  Resp 18  Ht 5\' 7"  (1.702 m)  Wt 179 lb 9.6 oz  (81.466 kg)  BMI 28.12 kg/m2  SpO2 100%  LMP 01/27/2013  Discharge Labs:  Results for orders placed during the hospital encounter of 02/02/13 (from the past 24 hour(s))  URINE RAPID DRUG SCREEN (HOSP PERFORMED)     Status: None   Collection Time    02/02/13 12:40 PM      Result Value Range   Opiates NONE DETECTED  NONE DETECTED   Cocaine NONE DETECTED  NONE DETECTED   Benzodiazepines NONE DETECTED  NONE DETECTED   Amphetamines NONE DETECTED  NONE DETECTED   Tetrahydrocannabinol NONE DETECTED  NONE DETECTED   Barbiturates NONE DETECTED  NONE DETECTED  CREATININE, URINE, RANDOM     Status: None   Collection Time  02/02/13 12:40 PM      Result Value Range   Creatinine, Urine 62.09    UREA NITROGEN, URINE     Status: None   Collection Time    02/02/13 12:40 PM      Result Value Range   Urea Nitrogen, Ur 279    COMPREHENSIVE METABOLIC PANEL     Status: Abnormal   Collection Time    02/03/13  6:30 AM      Result Value Range   Sodium 138  135 - 145 mEq/L   Potassium 3.6  3.5 - 5.1 mEq/L   Chloride 107  96 - 112 mEq/L   CO2 20  19 - 32 mEq/L   Glucose, Bld 107 (*) 70 - 99 mg/dL   BUN 8  6 - 23 mg/dL   Creatinine, Ser 1.05  0.50 - 1.10 mg/dL   Calcium 9.1  8.4 - 10.5 mg/dL   Total Protein 6.4  6.0 - 8.3 g/dL   Albumin 2.9 (*) 3.5 - 5.2 g/dL   AST 13  0 - 37 U/L   ALT 14  0 - 35 U/L   Alkaline Phosphatase 53  39 - 117 U/L   Total Bilirubin 0.2 (*) 0.3 - 1.2 mg/dL   GFR calc non Af Amer 63 (*) >90 mL/min   GFR calc Af Amer 73 (*) >90 mL/min  CBC     Status: Abnormal   Collection Time    02/03/13  6:30 AM      Result Value Range   WBC 9.5  4.0 - 10.5 K/uL   RBC 3.21 (*) 3.87 - 5.11 MIL/uL   Hemoglobin 7.8 (*) 12.0 - 15.0 g/dL   HCT 25.0 (*) 36.0 - 46.0 %   MCV 77.9 (*) 78.0 - 100.0 fL   MCH 24.3 (*) 26.0 - 34.0 pg   MCHC 31.2  30.0 - 36.0 g/dL   RDW 18.2 (*) 11.5 - 15.5 %   Platelets 226  150 - 400 K/uL  RETICULOCYTES     Status: Abnormal   Collection Time     02/03/13  6:30 AM      Result Value Range   Retic Ct Pct 3.6 (*) 0.4 - 3.1 %   RBC. 3.21 (*) 3.87 - 5.11 MIL/uL   Retic Count, Manual 115.6  19.0 - 186.0 K/uL    Signed: Hester Mates, MD 02/03/2013, 11:24 AM   Time Spent on Discharge: 45 minutes Services Ordered on Discharge: none Equipment Ordered on Discharge: none

## 2013-02-03 NOTE — Progress Notes (Signed)
Utilization review completed. Xai Frerking, RN, BSN. 

## 2013-02-03 NOTE — Progress Notes (Signed)
Subjective: Ruth Gutierrez is doing much better this morning.  She did not have any nausea/vomiting or diarrhea overnight and was able to eat her breakfast with no trouble  She is ready to go home today.  We will wait to see if she tolerates lunch and plan for dispo if she has no trouble.   Objective: Vital signs in last 24 hours: Filed Vitals:   02/02/13 1714 02/02/13 2055 02/03/13 0528 02/03/13 0935  BP: 123/75 124/81 124/72 148/88  Pulse: 94 100 101 95  Temp: 100.9 F (38.3 C) 98.8 F (37.1 C) 99.4 F (37.4 C) 100 F (37.8 C)  TempSrc: Oral Oral Oral Oral  Resp: 18 18 18 18   Height:      Weight:      SpO2: 100% 97% 100% 100%    Intake/Output Summary (Last 24 hours) at 02/03/13 1311 Last data filed at 02/03/13 1000  Gross per 24 hour  Intake 1577.5 ml  Output      0 ml  Net 1577.5 ml   PEX General: alert, cooperative, and in no apparent distress HEENT: vision grossly intact, oropharynx clear and non-erythematous  Neck: supple, no lymphadenopathy, JVD, or carotid bruits Lungs: clear to ascultation bilaterally, normal work of respiration, no wheezes, rales, ronchi Heart: regular rate and rhythm, no murmurs, gallops, or rubs Abdomen: soft, non-tender, non-distended, normal bowel sounds Extremities: 2+ DP/PT pulses bilaterally, no cyanosis, clubbing, or edema Neurologic: alert & oriented X3, cranial nerves II-XII intact, strength grossly intact, sensation intact to light touch   Lab Results: Basic Metabolic Panel:  Recent Labs Lab 02/02/13 0249 02/02/13 0429 02/03/13 0630  NA 134*  --  138  K 3.1*  --  3.6  CL 96  --  107  CO2 27  --  20  GLUCOSE 80  --  107*  BUN 15  --  8  CREATININE 1.52*  --  1.05  CALCIUM 9.3  --  9.1  MG  --  1.6  --   PHOS  --  3.5  --    Liver Function Tests:  Recent Labs Lab 02/02/13 0419 02/03/13 0630  AST 22 13  ALT 18 14  ALKPHOS 60 53  BILITOT 0.2* 0.2*  PROT 7.1 6.4  ALBUMIN 3.6 2.9*    Recent Labs Lab  02/02/13 0419  LIPASE 30   CBC:  Recent Labs Lab 02/02/13 0249 02/03/13 0630  WBC 9.8 9.5  HGB 8.2* 7.8*  HCT 26.5* 25.0*  MCV 77.0* 77.9*  PLT 258 226   BNP:  Recent Labs Lab 02/02/13 0249  PROBNP 118.9  Anemia Panel:  Recent Labs Lab 02/03/13 0630  VITAMINB12 537  FOLATE 8.8  FERRITIN 38  TIBC Not calculated due to Iron <10.  IRON <10*  RETICCTPCT 3.6*   Urine Drug Screen: Drugs of Abuse     Component Value Date/Time   LABOPIA NONE DETECTED 02/02/2013 1240   COCAINSCRNUR NONE DETECTED 02/02/2013 1240   LABBENZ NONE DETECTED 02/02/2013 1240   AMPHETMU NONE DETECTED 02/02/2013 1240   THCU NONE DETECTED 02/02/2013 1240   LABBARB NONE DETECTED 02/02/2013 1240    Urinalysis:  Recent Labs Lab 02/02/13 0538  COLORURINE YELLOW  LABSPEC 1.006  PHURINE 6.5  GLUCOSEU NEGATIVE  HGBUR LARGE*  BILIRUBINUR NEGATIVE  KETONESUR NEGATIVE  PROTEINUR 30*  UROBILINOGEN 0.2  NITRITE NEGATIVE  LEUKOCYTESUR LARGE*    Micro Results: Recent Results (from the past 240 hour(s))  CULTURE, BLOOD (ROUTINE X 2)     Status:  None   Collection Time    02/02/13  6:40 AM      Result Value Range Status   Specimen Description BLOOD RIGHT FOREARM   Final   Special Requests BOTTLES DRAWN AEROBIC ONLY 6CC   Final   Culture  Setup Time 02/02/2013 12:39   Final   Culture     Final   Value:        BLOOD CULTURE RECEIVED NO GROWTH TO DATE CULTURE WILL BE HELD FOR 5 DAYS BEFORE ISSUING A FINAL NEGATIVE REPORT   Report Status PENDING   Incomplete  CULTURE, BLOOD (ROUTINE X 2)     Status: None   Collection Time    02/02/13  6:50 AM      Result Value Range Status   Specimen Description BLOOD RIGHT ARM   Final   Special Requests BOTTLES DRAWN AEROBIC ONLY 10CC   Final   Culture  Setup Time 02/02/2013 12:42   Final   Culture     Final   Value:        BLOOD CULTURE RECEIVED NO GROWTH TO DATE CULTURE WILL BE HELD FOR 5 DAYS BEFORE ISSUING A FINAL NEGATIVE REPORT   Report Status PENDING    Incomplete   Studies/Results: Dg Chest 2 View  02/02/2013   *RADIOLOGY REPORT*  Clinical Data: Cough.  Chest pressure.  Short of breath for 2 weeks.  CHEST - 2 VIEW  Comparison: 04/24/2011.  Findings:  Cardiopericardial silhouette within normal limits. Mediastinal contours normal. Trachea midline.  No airspace disease or effusion.  IMPRESSION: No active cardiopulmonary disease.  No interval change.   Original Report Authenticated By: Dereck Ligas, M.D.   Medications: I have reviewed the patient's current medications. Scheduled Meds: . aspirin  81 mg Oral Daily  . ciprofloxacin  500 mg Oral BID  . enoxaparin (LOVENOX) injection  40 mg Subcutaneous Q24H  . feeding supplement  1 Container Oral BID BM  . imipenem-cilastatin  500 mg Intravenous Q8H  . mycophenolate  1,000 mg Oral BID  . tacrolimus  10 mg Oral Daily  . tacrolimus  9 mg Oral QHS   PRN Meds:.acetaminophen, albuterol, alum & mag hydroxide-simeth, ondansetron (ZOFRAN) IV, ondansetron  Assessment/Plan:  47 y/o female with PMH of ESRD s/p cadaveric renal transplant in 2010, HTN, chronic anemia who presented to the ED with nausea/vomiting/diarrhea now thought to be due to pyelonephritis with a concurrent diarrheal illness.   1. Pyelonephritis due to as of yet undetermined organism- Pt was febrile and tachycardic in the ED, thought to be due to pyelonephritis vs. gastroenteritis (granddaughter with diarrheal illness).  Considered more serious bacterial diarrhea infections as well as pneumonia given immunocompromised status.  CXR unremarkable, lactic acid 1.9.  Obtained blood cultures x 2, started pt on imipenem for broad coverage.  Now appears that source was pyelonephritis given urinary sx, right sided flank pain on admission, increased Cr on admission, and signs of infection on UA (+hem, +protein, +LE).  Pt likely also had a simultaneous viral gastroenteritis that was resolving prior to admission.  Burning chest pain that pt had on  admission after vomiting multiple times resolved with GI cocktail in ED and thus thought to be esophageal.  Also troponin negative, TIMI score of 1.  -d/c fluids since now taking PO -d/c telemetry -d/c imipenem; start ciprofloxacin 500 mg PO BID  -cancel stool pathogen panel as no diarrhea since admission so never collected -d/c C. Diff precautions since no diarrhea since admission -advance to renal diet -  follow up urine culture and blood cultures; if pathogen not sensitive to cipro, will change abx  2. Diarrhea- most likely etiology is viral infection given that pt has a granddaughter with a diarrheal illness, diarrhea had resolved by time of admission (no bowel movement since day before admission).   3. Acute renal failure- concerning given renal transplant.  Baseline Cr 1-1.1, 1.52 on admission, back to 1.05 today.  Calculated FeUrea to be 0.3% so prerenal etiology, as suspected. Mag and phosphate within normal limits.  -d/c fluids since now taking PO  4. S/p kidney transplant- acute kidney injury due to dehydration since Cr responded well to fluids -continue cell-cept and prograf  5. Iron deficiency amenia- Pt has iron pills to take at home but reports some non-compliance.  Also just finished her menses which may account for lower Hg than one year ago (Hg 8.2 on admission, baseline appears to be 10).  Ordered anemia panel.  -pt iron depleted but good reticulocyte count, encouraged to take iron pills and follow-up with Dr. Jimmy Footman    6. Thyroid function abnormality- pt had history of a low TSH so repeated during this admission.  Given result of TSH = 0.009, would recommend free T3, free T4 measurements as outpatient to determine whether the patient has enough active thyroid hormone, followed by appropriate treatment.   7. DVT ppx- lovenox subq  Dispo: Disposition today if pt tolerates lunch.   The patient does have a current PCP Ruth Sou, MD) and does need an [facility] hospital  follow-up appointment after discharge.   LOS: 1 day   Ivin Poot, MD 02/03/2013, 1:11 PM

## 2013-02-03 NOTE — Progress Notes (Signed)
Pt signed d/c papers. Discussed follow up with MD Deterding. IV removed, pt given work excuse. Escorted to vehicle in wheelchair accompanied by family and NT.

## 2013-02-04 LAB — URINE CULTURE: Colony Count: >=100000

## 2013-02-08 LAB — CULTURE, BLOOD (ROUTINE X 2): Culture: NO GROWTH

## 2013-02-08 NOTE — Discharge Summary (Signed)
E. Coli was sensitive to Cipro and this was continued.

## 2013-04-05 ENCOUNTER — Emergency Department (HOSPITAL_COMMUNITY): Payer: BC Managed Care – PPO

## 2013-04-05 ENCOUNTER — Encounter (HOSPITAL_COMMUNITY): Payer: Self-pay | Admitting: *Deleted

## 2013-04-05 ENCOUNTER — Inpatient Hospital Stay (HOSPITAL_COMMUNITY)
Admission: EM | Admit: 2013-04-05 | Discharge: 2013-04-08 | DRG: 551 | Disposition: A | Discharge status: Home / Self Care | Payer: BC Managed Care – PPO | Attending: Internal Medicine | Admitting: Internal Medicine

## 2013-04-05 DIAGNOSIS — T451X5A Adverse effect of antineoplastic and immunosuppressive drugs, initial encounter: Secondary | ICD-10-CM | POA: Diagnosis present

## 2013-04-05 DIAGNOSIS — R197 Diarrhea, unspecified: Secondary | ICD-10-CM | POA: Diagnosis present

## 2013-04-05 DIAGNOSIS — E861 Hypovolemia: Secondary | ICD-10-CM | POA: Diagnosis present

## 2013-04-05 DIAGNOSIS — N186 End stage renal disease: Secondary | ICD-10-CM | POA: Diagnosis present

## 2013-04-05 DIAGNOSIS — D509 Iron deficiency anemia, unspecified: Secondary | ICD-10-CM | POA: Diagnosis present

## 2013-04-05 DIAGNOSIS — Z7982 Long term (current) use of aspirin: Secondary | ICD-10-CM

## 2013-04-05 DIAGNOSIS — Z880 Allergy status to penicillin: Secondary | ICD-10-CM

## 2013-04-05 DIAGNOSIS — N179 Acute kidney failure, unspecified: Secondary | ICD-10-CM | POA: Diagnosis present

## 2013-04-05 DIAGNOSIS — R112 Nausea with vomiting, unspecified: Secondary | ICD-10-CM

## 2013-04-05 DIAGNOSIS — N390 Urinary tract infection, site not specified: Secondary | ICD-10-CM

## 2013-04-05 DIAGNOSIS — A088 Other specified intestinal infections: Principal | ICD-10-CM | POA: Diagnosis present

## 2013-04-05 DIAGNOSIS — D638 Anemia in other chronic diseases classified elsewhere: Secondary | ICD-10-CM | POA: Diagnosis present

## 2013-04-05 DIAGNOSIS — Z720 Tobacco use: Secondary | ICD-10-CM | POA: Diagnosis present

## 2013-04-05 DIAGNOSIS — I120 Hypertensive chronic kidney disease with stage 5 chronic kidney disease or end stage renal disease: Secondary | ICD-10-CM | POA: Diagnosis present

## 2013-04-05 DIAGNOSIS — K219 Gastro-esophageal reflux disease without esophagitis: Secondary | ICD-10-CM | POA: Diagnosis present

## 2013-04-05 DIAGNOSIS — F17200 Nicotine dependence, unspecified, uncomplicated: Secondary | ICD-10-CM | POA: Diagnosis present

## 2013-04-05 DIAGNOSIS — I10 Essential (primary) hypertension: Secondary | ICD-10-CM | POA: Diagnosis present

## 2013-04-05 DIAGNOSIS — Z940 Kidney transplant status: Secondary | ICD-10-CM

## 2013-04-05 DIAGNOSIS — A084 Viral intestinal infection, unspecified: Secondary | ICD-10-CM

## 2013-04-05 DIAGNOSIS — N10 Acute tubulo-interstitial nephritis: Secondary | ICD-10-CM | POA: Diagnosis present

## 2013-04-05 DIAGNOSIS — Y92009 Unspecified place in unspecified non-institutional (private) residence as the place of occurrence of the external cause: Secondary | ICD-10-CM

## 2013-04-05 HISTORY — DX: Gastro-esophageal reflux disease without esophagitis: K21.9

## 2013-04-05 HISTORY — DX: Headache: R51

## 2013-04-05 HISTORY — DX: Unspecified osteoarthritis, unspecified site: M19.90

## 2013-04-05 HISTORY — DX: Shortness of breath: R06.02

## 2013-04-05 LAB — POCT PREGNANCY, URINE: Preg Test, Ur: NEGATIVE

## 2013-04-05 LAB — URINALYSIS, ROUTINE W REFLEX MICROSCOPIC
Glucose, UA: NEGATIVE mg/dL
Hgb urine dipstick: NEGATIVE
pH: 5.5 (ref 5.0–8.0)

## 2013-04-05 LAB — URINE MICROSCOPIC-ADD ON

## 2013-04-05 LAB — COMPREHENSIVE METABOLIC PANEL
ALT: 10 U/L (ref 0–35)
AST: 10 U/L (ref 0–37)
Albumin: 3.1 g/dL — ABNORMAL LOW (ref 3.5–5.2)
CO2: 26 mEq/L (ref 19–32)
Calcium: 9.3 mg/dL (ref 8.4–10.5)
Creatinine, Ser: 1.49 mg/dL — ABNORMAL HIGH (ref 0.50–1.10)
GFR calc non Af Amer: 41 mL/min — ABNORMAL LOW (ref >90)
Sodium: 135 mEq/L (ref 135–145)
Total Protein: 6.8 g/dL (ref 6.0–8.3)

## 2013-04-05 LAB — CBC WITH DIFFERENTIAL/PLATELET
Eosinophils Absolute: 0.1 10*3/uL (ref 0.0–0.7)
HCT: 27.3 % — ABNORMAL LOW (ref 36.0–46.0)
Hemoglobin: 8.3 g/dL — ABNORMAL LOW (ref 12.0–15.0)
Lymphs Abs: 1.3 10*3/uL (ref 0.7–4.0)
MCH: 23.4 pg — ABNORMAL LOW (ref 26.0–34.0)
Monocytes Absolute: 1.9 10*3/uL — ABNORMAL HIGH (ref 0.1–1.0)
Monocytes Relative: 15 % — ABNORMAL HIGH (ref 3–12)
Neutro Abs: 9.7 10*3/uL — ABNORMAL HIGH (ref 1.7–7.7)
Neutrophils Relative %: 74 % (ref 43–77)
RBC: 3.54 MIL/uL — ABNORMAL LOW (ref 3.87–5.11)

## 2013-04-05 LAB — CG4 I-STAT (LACTIC ACID): Lactic Acid, Venous: 0.93 mmol/L (ref 0.5–2.2)

## 2013-04-05 IMAGING — CR DG ABDOMEN ACUTE W/ 1V CHEST
3 series · 3 of 3 positions shown · non-contrast
Comparison: PA and lateral chest [DATE].

CLINICAL DATA: Diarrhea.  Fever.

ACUTE ABDOMEN SERIES (ABDOMEN 2 VIEW & CHEST 1 VIEW)

[w chest pa]
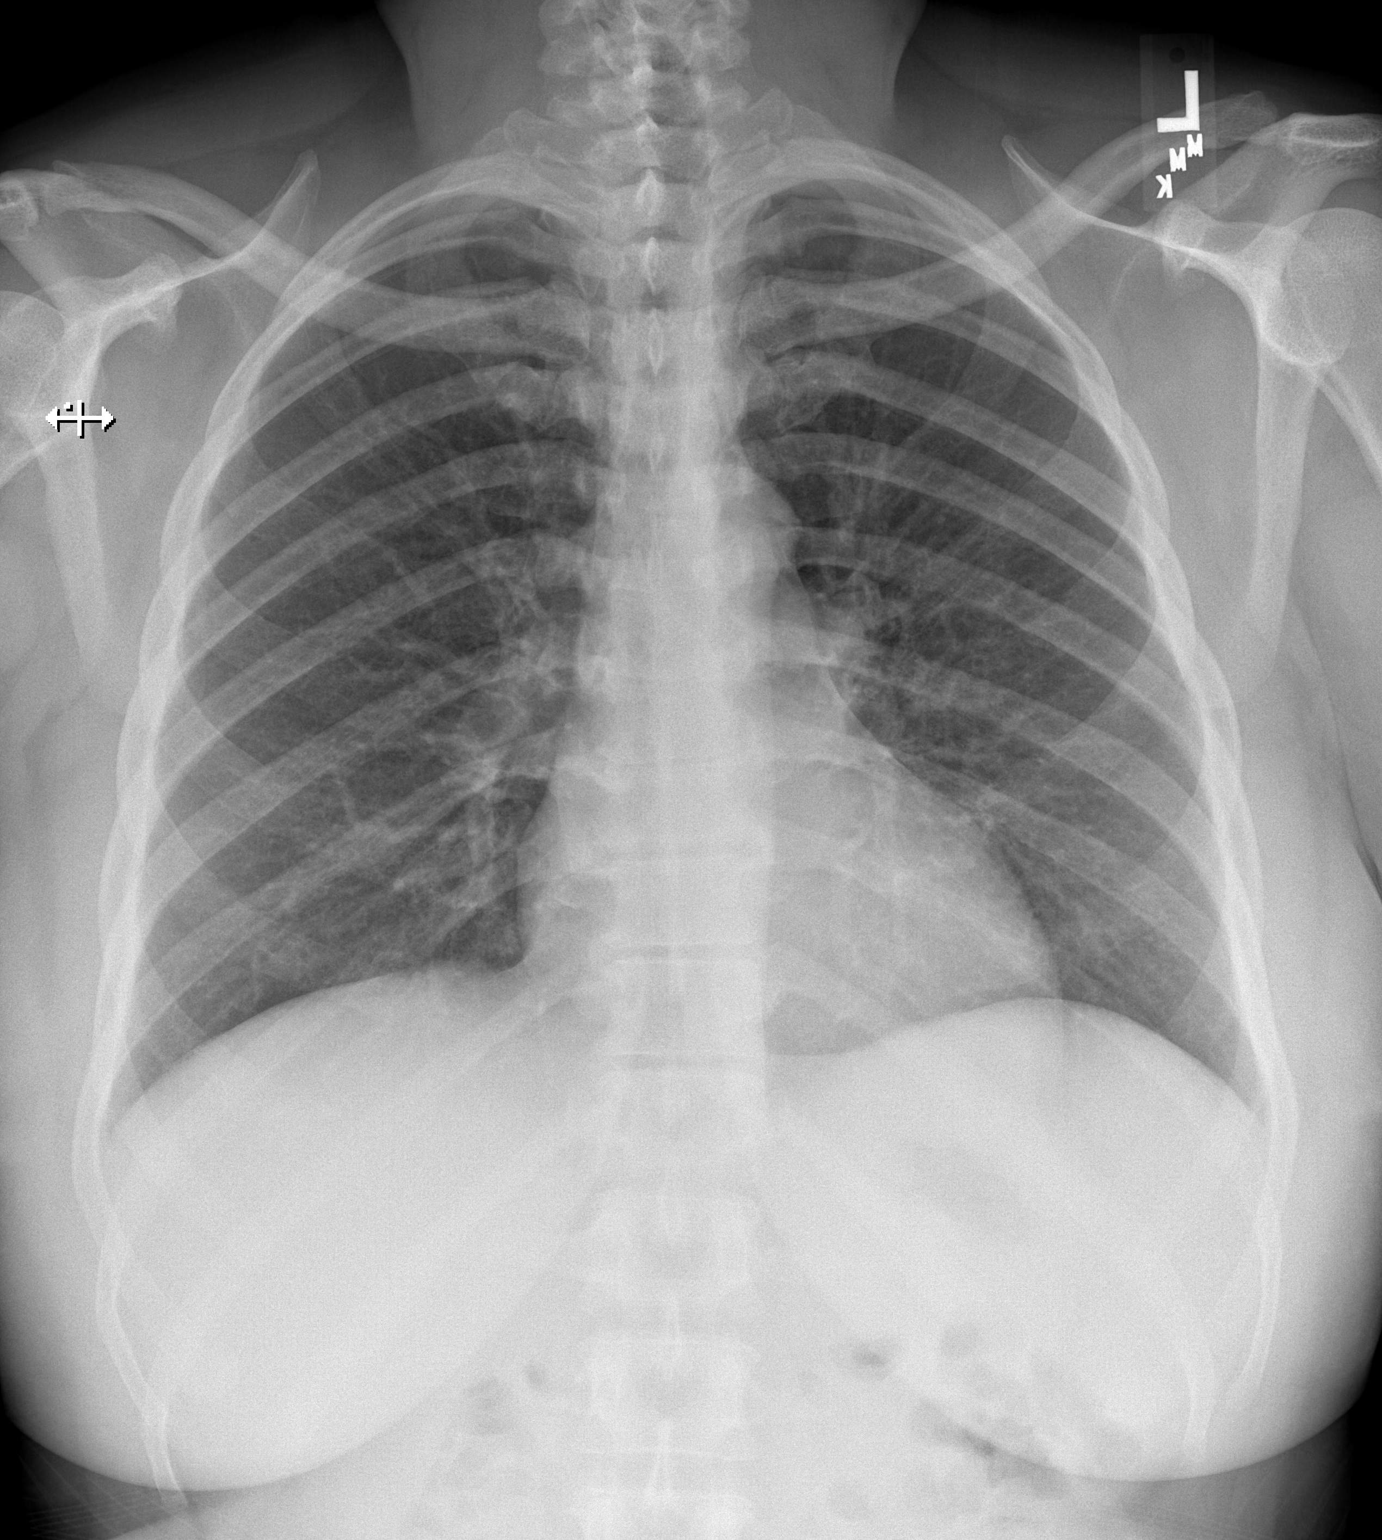

[w abdomen upright]
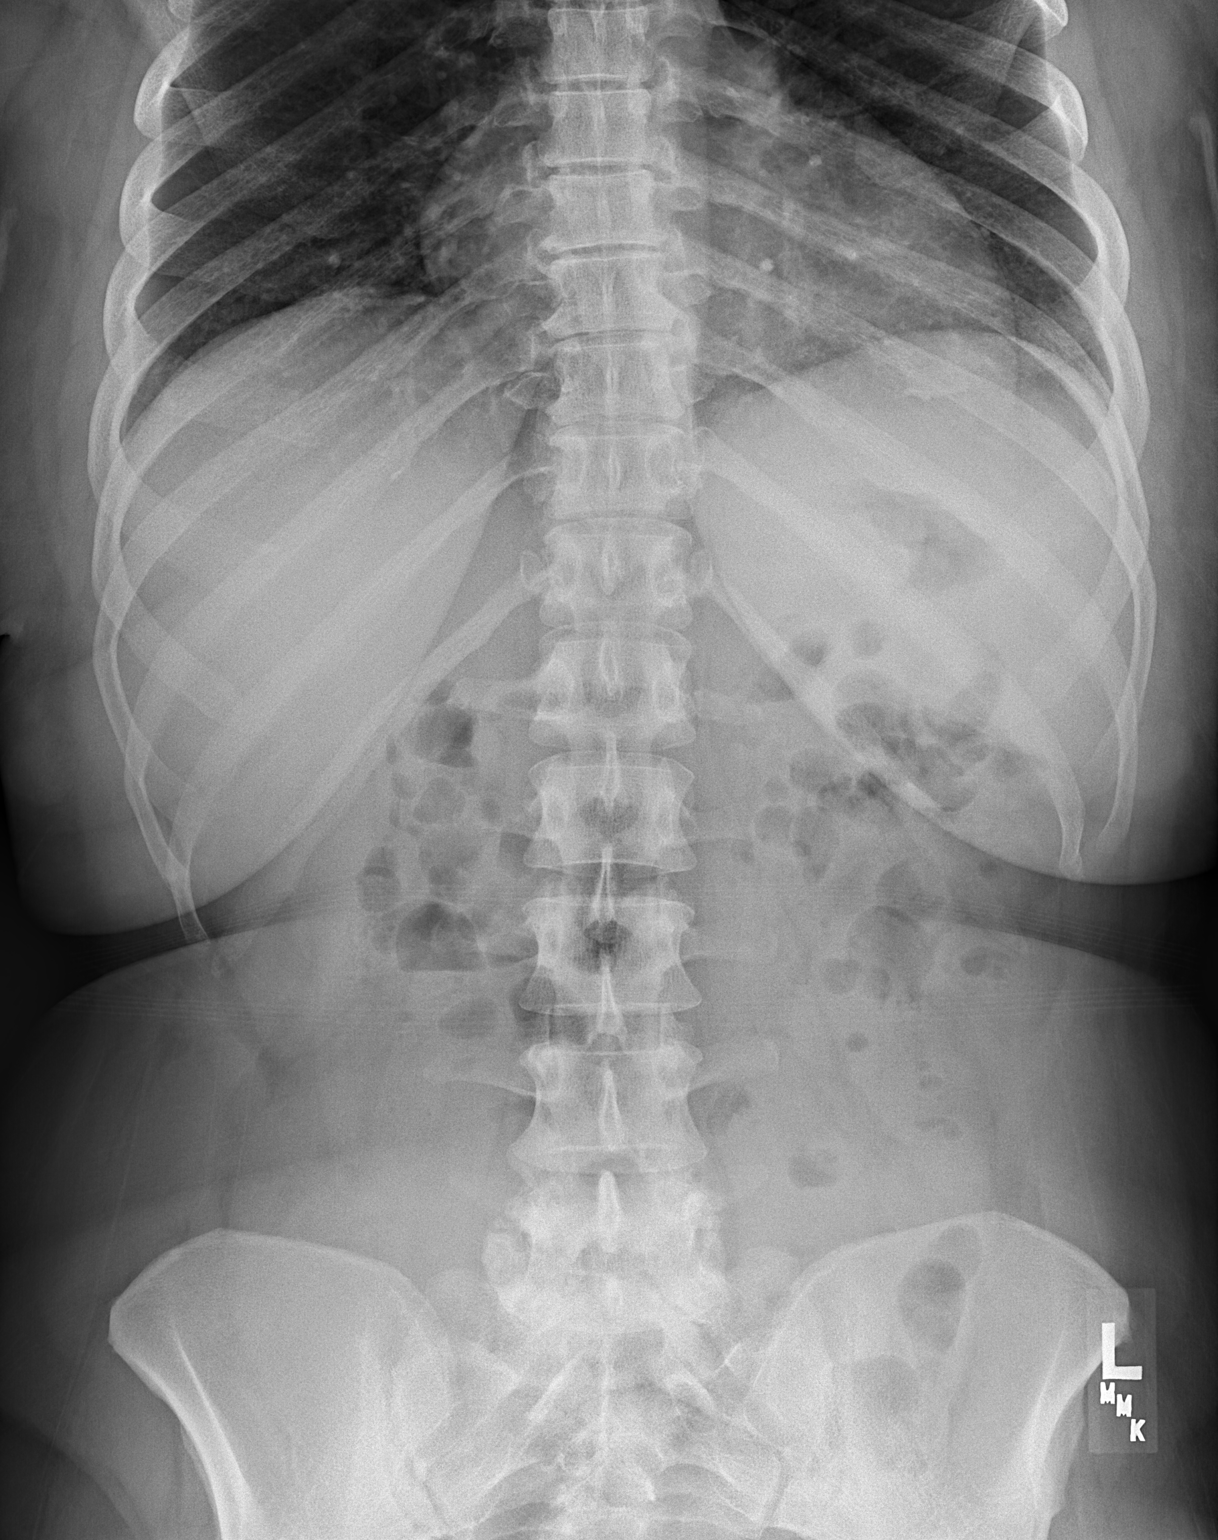

[t abdomen supine]
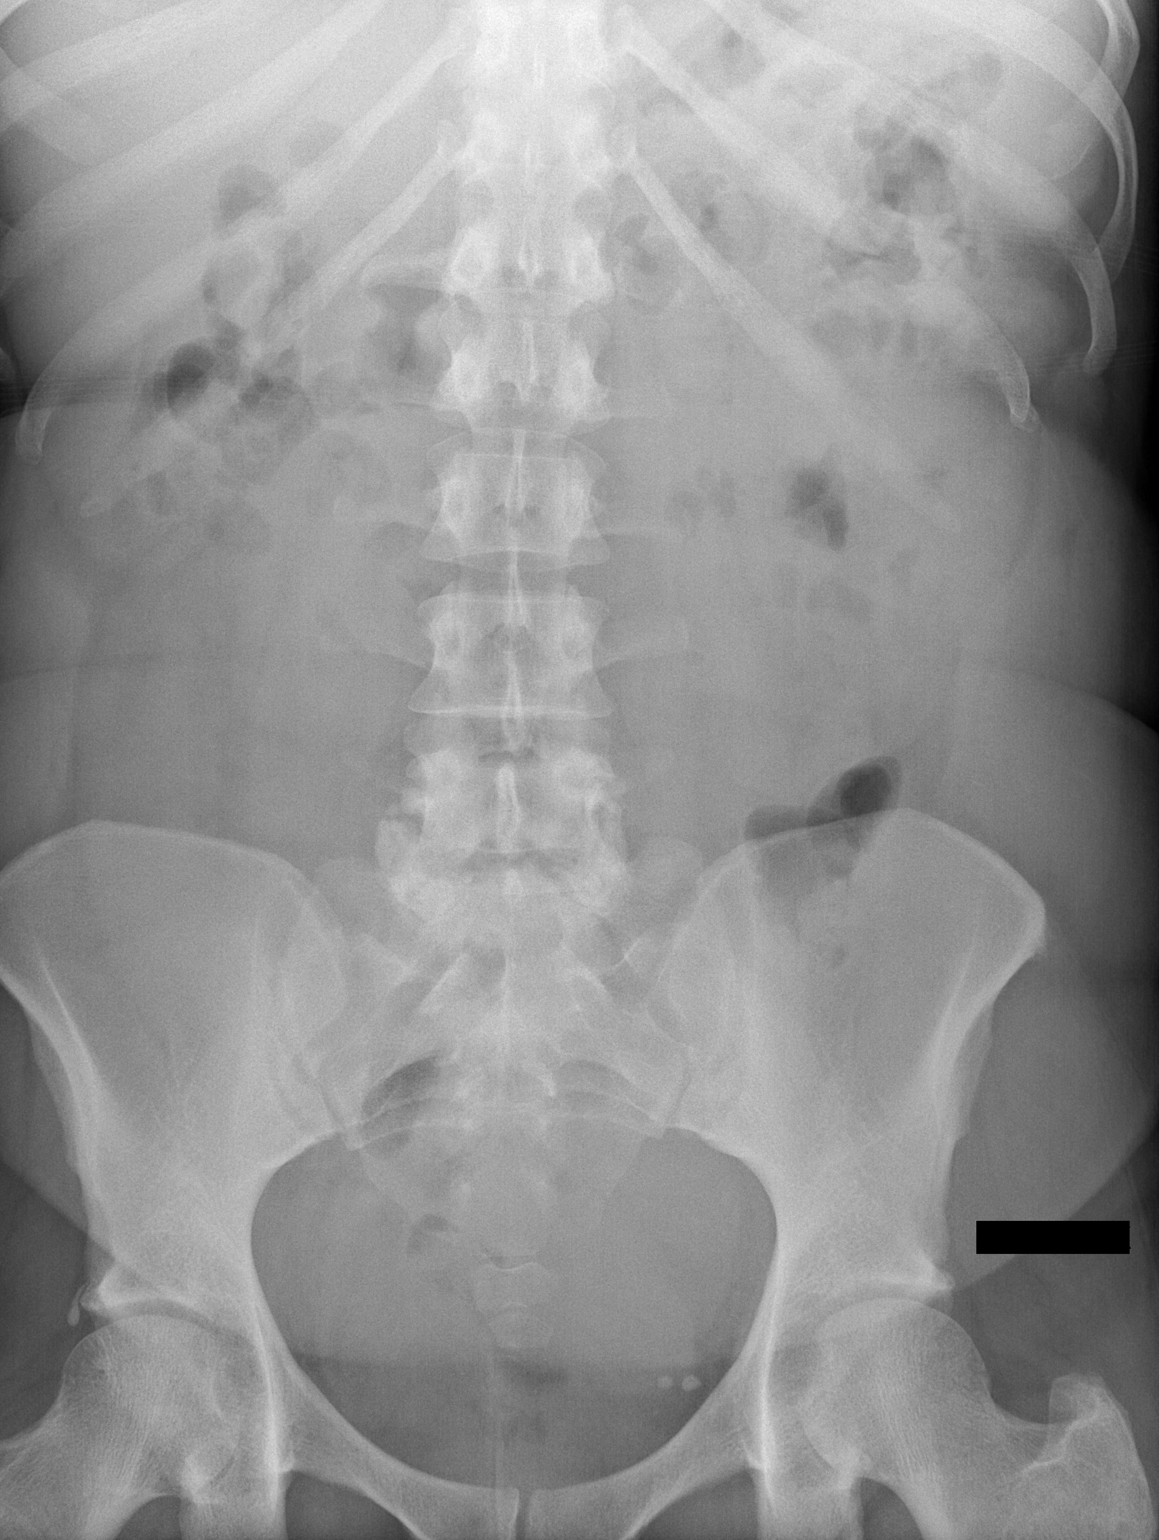

[3 of 3 positions shown; findings below may reference images not displayed]

FINDINGS: Single view of the chest demonstrates clear lungs and
normal heart size.  No pneumothorax or pleural fluid.

Two views of the abdomen show a normal bowel gas pattern.  There is
no free intraperitoneal air.  No abnormal abdominal calcification
is seen.  There is no focal bony abnormality.
IMPRESSION: Negative exam.

## 2013-04-05 MED ORDER — FERROUS SULFATE 325 (65 FE) MG PO TABS
325.0000 mg | ORAL_TABLET | Freq: Every day | ORAL | Status: DC
  Administered 2013-04-06 – 2013-04-08 (×3): 325 mg via ORAL
  Filled 2013-04-05 (×5): qty 1

## 2013-04-05 MED ORDER — SODIUM CHLORIDE 0.9 % IV SOLN
INTRAVENOUS | Status: DC
  Administered 2013-04-06 – 2013-04-07 (×4): via INTRAVENOUS

## 2013-04-05 MED ORDER — DEXTROSE 5 % IV SOLN
1.0000 g | INTRAVENOUS | Status: DC
  Administered 2013-04-06 – 2013-04-07 (×2): 1 g via INTRAVENOUS
  Filled 2013-04-05 (×3): qty 10

## 2013-04-05 MED ORDER — TACROLIMUS 1 MG PO CAPS: 9.0000 mg | ORAL_CAPSULE | Freq: Two times a day (BID) | ORAL | Status: DC

## 2013-04-05 MED ORDER — ACETAMINOPHEN 325 MG PO TABS
650.0000 mg | ORAL_TABLET | Freq: Once | ORAL | Status: AC
  Administered 2013-04-05: 650 mg via ORAL
  Filled 2013-04-05: qty 2

## 2013-04-05 MED ORDER — ACETAMINOPHEN 325 MG PO TABS
650.0000 mg | ORAL_TABLET | Freq: Four times a day (QID) | ORAL | Status: DC | PRN
  Administered 2013-04-06: 650 mg via ORAL
  Filled 2013-04-05: qty 2

## 2013-04-05 MED ORDER — SODIUM CHLORIDE 0.9 % IV BOLUS (SEPSIS)
1000.0000 mL | Freq: Once | INTRAVENOUS | Status: AC
  Administered 2013-04-05: 1000 mL via INTRAVENOUS

## 2013-04-05 MED ORDER — HEPARIN SODIUM (PORCINE) 5000 UNIT/ML IJ SOLN
5000.0000 [IU] | Freq: Three times a day (TID) | INTRAMUSCULAR | Status: DC
  Administered 2013-04-07: 5000 [IU] via SUBCUTANEOUS
  Filled 2013-04-05 (×10): qty 1

## 2013-04-05 MED ORDER — ACETAMINOPHEN 650 MG RE SUPP: 650.0000 mg | Freq: Four times a day (QID) | RECTAL | Status: DC | PRN

## 2013-04-05 MED ORDER — ONDANSETRON HCL 4 MG/2ML IJ SOLN
4.0000 mg | Freq: Once | INTRAMUSCULAR | Status: AC
  Administered 2013-04-05: 4 mg via INTRAVENOUS
  Filled 2013-04-05: qty 2

## 2013-04-05 MED ORDER — POTASSIUM CHLORIDE CRYS ER 20 MEQ PO TBCR
40.0000 meq | EXTENDED_RELEASE_TABLET | Freq: Once | ORAL | Status: AC
  Administered 2013-04-05: 40 meq via ORAL
  Filled 2013-04-05: qty 2

## 2013-04-05 MED ORDER — MYCOPHENOLATE MOFETIL 250 MG PO CAPS
1000.0000 mg | ORAL_CAPSULE | Freq: Two times a day (BID) | ORAL | Status: DC
  Administered 2013-04-06 – 2013-04-08 (×6): 1000 mg via ORAL
  Filled 2013-04-05 (×8): qty 4

## 2013-04-05 MED ORDER — DEXTROSE 5 % IV SOLN
1.0000 g | Freq: Once | INTRAVENOUS | Status: AC
  Administered 2013-04-05: 1 g via INTRAVENOUS
  Filled 2013-04-05: qty 10

## 2013-04-05 NOTE — ED Notes (Signed)
Pt given apple juice  

## 2013-04-05 NOTE — ED Notes (Signed)
Phlebotomy informed we need blood drawn for blood cultures.

## 2013-04-05 NOTE — ED Notes (Signed)
PT made aware that we need stool sample for PCR

## 2013-04-05 NOTE — ED Notes (Addendum)
Pt reports diarrhea x 2 weeks, having n/v yesterday and fatigue, feeling lightheaded and unable to work last night. Woke up with headache this am. Having productive cough at triage, and temp 101.7.

## 2013-04-05 NOTE — ED Notes (Signed)
Called for pt with no answer 

## 2013-04-05 NOTE — ED Notes (Signed)
Pt requesting tylenol 

## 2013-04-05 NOTE — H&P (Signed)
Date: 04/05/2013               Patient Name:  Ruth Gutierrez MRN: [id number]  DOB: [birth year] Age / Sex: 47 y.o., female   PCP: Placido Sou, MD         Medical Service: Internal Medicine Teaching Service         Attending Physician: Dr. Mariea Clonts, MD    First Contact: Dr. Loyal Jacobson, MD Pager: [phone]  Second Contact: Dr. Jannette Fogo, MD Pager: [phone]       After Hours (After 5p/  First Contact Pager: [phone]  weekends / holidays): Second Contact Pager: [phone]   Chief Complaint: Diarrhea  History of Present Illness: Ruth Gutierrez is a 48 y.o. female with a pmhx of ESRD s/p DDKT in 2010 who comes to the hospital with a cc of diarrhea. The patient was in her normal state of health until 2 weeks ago when she began to have constant diarrhea with 3-4 loose, nonbloody BMs per day. This is a similar presentation as last admission; has not tried any medications to alleviate diarrhea, has not been seen by outpatient MD  The patient has associated symptoms of nausea, vomiting, dizziness, and headache. She reports at least 1-2 episodes of emesis yesterday, as well as increasing dizziness and light headedness over the last 4 days. On the day of admission, she has had fatigue leading her to be in bed all day, ran a temperature of 103 and started have back aches in the lumbar spine above the PSIS. She tried aleve for pain with some relief. Of note, she reports compliance with cellcept, prograf, atenolol, amlodipine, lasix (BID). She is able to tolerate food and fluids, but admits to decreased oral intake over the last few days.  Denies vaginal discharge, no malodorous urine. She cannot quantify amount of urinary output. Denies SOB, edema, CP     Meds: No current facility-administered medications for this encounter.   Current Outpatient Prescriptions  Medication Sig Dispense Refill  . amLODipine (NORVASC) 10 MG tablet Take 10 mg by mouth daily.       Marland Kitchen aspirin 81 MG  tablet Take 81 mg by mouth daily.       Marland Kitchen atenolol (TENORMIN) 50 MG tablet Take 50 mg by mouth 2 (two) times daily.       Marland Kitchen docusate sodium (COLACE) 100 MG capsule Take 100 mg by mouth 2 (two) times daily as needed for constipation.       . ferrous sulfate 325 (65 FE) MG tablet Take 325 mg by mouth daily. Take 2 tabs by mouth twice daily.      . furosemide (LASIX) 40 MG tablet Take 40 mg by mouth 2 (two) times daily.       Marland Kitchen KLOR-CON M20 20 MEQ tablet Take 20 mEq by mouth 2 (two) times daily.       . mycophenolate (CELLCEPT) 250 MG capsule Take 1,000 mg by mouth 2 (two) times daily. Take 4 capsules by mouth twice daily.      . Naproxen Sodium (ALEVE PO) Take 2 tablets by mouth daily as needed (pain).      . tacrolimus (PROGRAF) 1 MG capsule Take 9-10 mg by mouth 2 (two) times daily. Take 10 capsules in the morning and takes 9 capsules at night        Allergies: Allergies as of 04/05/2013 - Review Complete 04/05/2013  Allergen Reaction Noted  . Penicillins Other (See Comments) 02/02/2013   Past  Medical History  Diagnosis Date  . Deceased-donor kidney transplant     Performed at [facility], April 2010.  Initial ESRD due to HTN nephropathy  . Anemia of chronic disease   . ESRD (end stage renal disease)     s/p transplant creatinine baseline 1.1  . History of hyperparathyroidism   . Hypertension    Past Surgical History  Procedure Laterality Date  . Kidney transplant  11/2008    [facility])  . Av fistula placement     Family History  Problem Relation Age of Onset  . Diabetes    . Hypertension     History   Social History  . Marital Status: Single    Spouse Name: N/A    Number of Children: N/A  . Years of Education: N/A   Occupational History  . Not on file.   Social History Main Topics  . Smoking status: Current Every Day Smoker -- 0.50 packs/day for 27 years    Types: Cigarettes  . Smokeless tobacco: Never Used     Comment: 3- 4 cigarettes a day  . Alcohol  Use: Yes     Comment: occasional drinker  . Drug Use: No  . Sexual Activity: Not on file   Other Topics Concern  . Not on file   Social History Narrative   Lives alone, not working, sister helps her with medications etc.    Review of Systems: All pertinent ROS are included in the HPI. ROS   Physical Exam: Blood pressure 118/62, pulse 100, temperature 98.9 F (37.2 C), temperature source Oral, resp. rate 16, last menstrual period 03/15/2013, SpO2 100.00%. Physical Exam  Constitutional: She is oriented to person, place, and time. She appears well-developed and well-nourished.  HENT:  Head: Normocephalic.  Mouth/Throat: No oropharyngeal exudate.  Tacky membranes  Eyes: EOM are normal. Pupils are equal, round, and reactive to light.  Cardiovascular: Normal rate, regular rhythm, normal heart sounds and intact distal pulses.  Exam reveals no friction rub.   No murmur heard. Pulmonary/Chest: Effort normal. No respiratory distress. She has wheezes. She has no rales. She exhibits no tenderness.  Wheezing bilaterally  Abdominal: Soft. Bowel sounds are normal.  Mass in RLQ quadrant mildly tender to palpation, no rebound or gaurding.  Suprapubic tenderness.  Musculoskeletal: She exhibits no edema and no tenderness.  Neurological: She is alert and oriented to person, place, and time.  Psychiatric: She has a normal mood and affect. Her behavior is normal.   Lab results: Basic Metabolic Panel:  Recent Labs  04/05/13 1011  NA 135  K 3.2*  CL 99  CO2 26  GLUCOSE 119*  BUN 17  CREATININE 1.49*  CALCIUM 9.3   Anion Gap: 10  Liver Function Tests:  Recent Labs  04/05/13 1011  AST 10  ALT 10  ALKPHOS 50  BILITOT 0.3  PROT 6.8  ALBUMIN 3.1*    Recent Labs  04/05/13 1011  LIPASE 21   CBC:  Recent Labs  04/05/13 1011  WBC 13.0*  NEUTROABS 9.7*  HGB 8.3*  HCT 27.3*  MCV 77.1*  PLT 225   Urine Drug Screen: Drugs of Abuse     Component Value Date/Time    LABOPIA NONE DETECTED 02/02/2013 1240   COCAINSCRNUR NONE DETECTED 02/02/2013 1240   LABBENZ NONE DETECTED 02/02/2013 1240   AMPHETMU NONE DETECTED 02/02/2013 1240   THCU NONE DETECTED 02/02/2013 1240   LABBARB NONE DETECTED 02/02/2013 1240    Urinalysis:  Recent Labs  04/05/13  [place]  LABSPEC 1.011  PHURINE 5.5  GLUCOSEU NEGATIVE  HGBUR NEGATIVE  BILIRUBINUR NEGATIVE  KETONESUR NEGATIVE  PROTEINUR NEGATIVE  UROBILINOGEN 0.2  NITRITE NEGATIVE  LEUKOCYTESUR MODERATE*   Imaging results:  Dg Abd Acute W/chest  04/05/2013   *RADIOLOGY REPORT*  Clinical Data: Diarrhea.  Fever.  ACUTE ABDOMEN SERIES (ABDOMEN 2 VIEW & CHEST 1 VIEW)  Comparison: PA and lateral chest 02/02/2013.  Findings: Single view of the chest demonstrates clear lungs and normal heart size.  No pneumothorax or pleural fluid.  Two views of the abdomen show a normal bowel gas pattern.  There is no free intraperitoneal air.  No abnormal abdominal calcification is seen.  There is no focal bony abnormality.  IMPRESSION: Negative exam.   Original Report Authenticated By: Orlean Patten, M.D.    Other results: Assessment & Plan by Problem: Principal Problem:   Acute renal failure Active Problems:   Iron deficiency anemia   Hypertension   S/P kidney transplant   Tobacco abuse   Acute pyelonephritis   Diarrhea  # Acute on Chronic Renal Failure The patients AKI is likely pre-renal and 2/2 hypovolemia. The patients hypovolemia is likely multifactorial resulting from diarrhea, decreased PO intake, and vomitting. Furthermore, the patient endorses orthostatic symptoms. UA was negative for blood, ketones, protein, spec gravity is 1.011. Intrinsic renal failure etiologies remain possible, but less likely. May consider further w/u if not responsive to fluid replacement. - F/U orthostatic vital signs - Optimize Fluid Status (NS 125 cc/hr, 1 L bolus in ED) - Strict I/O - Continue home mycophenolate and tacrolimus - Hold  anti-HTN's including furosemide - F/U morning BMP CBC - May consider echo, and tacrolimus level in AM  # Acute Pyelonephritis with leukocytosis (13.0) The patient likely has acute pyelonephritis as she has suprapubic tenderness, frequency, N/V, and fever. Her admission UA is positive for leukocytes and numerous bacteria, but is limited as many squamous cells were present. Thus it may represent a poor collection. As the patient received IV ceftriaxone in the ED a second collection for the purpose of UCx may have limited value. Sepsis is unlikely as VSS and lactic acid 0.93. - IV Ceftriaxone - F/U UCx  # Diarrhea The patients diarrhea is likely 2/2 to viral gastroenteritis as she previously had a sick contact before her last admission.  Tacrolimus is also likely as 25-72% of patients on tacrolimus have diarrhea as a side effect. Bacterial and inflammatory causes cannot be ruled out at this time, but remain less likely. The diarrhea is concerning as it has persisted for the last two weeks and is occuring after recent antibiotic use. Thus, C Diff should be excluded. - F/U GI Pathogen Panel (CAMPYLOBACTER by [facility] DIFFICILE TOXIN A/B, E COLI 0157 by PCR, E COLI (ETEC) LT/ST, E COLI (STEC), SALMONELLA by PCR, SHIGELLA by PCR, NOROVIRUS G1/G2, ROTAVIRUS A by PCR, G LAMBLIA by PCR, CRYPTOSPORIDIUM by PCR) - Treat symptomatically with fluid replacement. - May Check tacrolimus level if not improving.  # Hypertension - Hold home anti-htn as patient is likely hypovolemic and has AKI  # S/P Kidney Transplant - Continue home tacrolimus and mycophenolate - See AoCKD Plan  # Microcytic Anemia Appears stable as Hg 8.2 Baseline from last admission, today 8.3. Likely 2/2 to iron deficiency as microcytic. Tacrolimus is also know to cause anemia and may be contributing. - F/U AM CBC - Consider iron PO/IV once records obtained from PCP  Dispo: Disposition is deferred at this time, awaiting  improvement of  current medical problems. Anticipated discharge in approximately 1-2 day(s).   The patient does have a current PCP Placido Sou, MD) and does not need an [facility] hospital follow-up appointment after discharge.  The patient does not know have transportation limitations that hinder transportation to clinic appointments.  Signed: Marrion Coy, MD 04/05/2013, 7:30 PM

## 2013-04-05 NOTE — ED Provider Notes (Signed)
CSN: [id number]     Arrival date & time 04/05/13  [id number] History   First MD Initiated Contact with Patient 04/05/13 1307     Chief Complaint  Patient presents with  . Diarrhea  . Headache   (Consider location/radiation/quality/duration/timing/severity/associated sxs/prior Treatment) The history is provided by the patient and medical records.   Pt presents to the ED for non-bloody, watery diarrhea x 2 weeks.  Hx of similar approx 2 months ago.  No recent abx use.  Began having nausea and non-bloody, non-bilious vomiting yesterday and has been unable to tolerate anything PO today. Notes a productive cough with white mucus for the past few days.  No known recent sick contacts. Denies any fevers, sweats, or chills.  No recent travel.  No urinary sx. Pt has hx of renal transplant in 2010 at [facility] secondary to HTN nephropathy.  Pt followed by Dr. Jimmy Footman.  Denies any abdominal pain, chest pain, or SOB.  Past Medical History  Diagnosis Date  . Deceased-donor kidney transplant     Performed at [facility], April 2010.  Initial ESRD due to HTN nephropathy  . Anemia of chronic disease   . ESRD (end stage renal disease)     s/p transplant creatinine baseline 1.1  . History of hyperparathyroidism   . Hypertension    Past Surgical History  Procedure Laterality Date  . Kidney transplant  11/2008    [facility])  . Av fistula placement     Family History  Problem Relation Age of Onset  . Diabetes    . Hypertension     History  Substance Use Topics  . Smoking status: Current Every Day Smoker -- 0.50 packs/day for 27 years    Types: Cigarettes  . Smokeless tobacco: Never Used     Comment: 3- 4 cigarettes a day  . Alcohol Use: Yes     Comment: occasional drinker   OB History   Grav Para Term Preterm Abortions TAB SAB Ect Mult Living                 Review of Systems  Respiratory: Positive for cough.   Gastrointestinal: Positive for nausea, vomiting and diarrhea.  All other systems  reviewed and are negative.    Allergies  Penicillins  Home Medications   Current Outpatient Rx  Name  Route  Sig  Dispense  Refill  . amLODipine (NORVASC) 10 MG tablet   Oral   Take 10 mg by mouth daily.          Marland Kitchen aspirin 81 MG tablet   Oral   Take 81 mg by mouth daily.           Marland Kitchen atenolol (TENORMIN) 50 MG tablet   Oral   Take 50 mg by mouth 2 (two) times daily.          . ciprofloxacin (CIPRO) 500 MG tablet   Oral   Take 1 tablet (500 mg total) by mouth 2 (two) times daily.   28 tablet   0   . docusate sodium (COLACE) 100 MG capsule   Oral   Take 100 mg by mouth 2 (two) times daily as needed.          . ferrous sulfate 325 (65 FE) MG tablet      325 mg. Take 2 tabs by mouth twice daily.          . furosemide (LASIX) 40 MG tablet   Oral   Take 40 mg by mouth  2 (two) times daily.          Marland Kitchen KLOR-CON M20 20 MEQ tablet   Oral   Take 20 mEq by mouth 2 (two) times daily.          . mycophenolate (CELLCEPT) 250 MG capsule      250 mg. Take 4 capsules by mouth twice daily.          . tacrolimus (PROGRAF) 1 MG capsule   Oral   Take 9-10 mg by mouth 2 (two) times daily. Take 10 capsules in the morning and takes 9 capsules at night          BP 113/72  Pulse 96  Temp(Src) 98.9 F (37.2 C) (Oral)  Resp 17  SpO2 97%  LMP 03/15/2013  Physical Exam  Nursing note and vitals reviewed. Constitutional: She is oriented to person, place, and time. She appears well-developed and well-nourished. No distress.  HENT:  Head: Normocephalic and atraumatic.  Mouth/Throat: Oropharynx is clear and moist.  Eyes: Conjunctivae and EOM are normal. Pupils are equal, round, and reactive to light.  Neck: Normal range of motion. Neck supple.  Cardiovascular: Normal rate, regular rhythm and normal heart sounds.   Pulmonary/Chest: Effort normal and breath sounds normal. No respiratory distress. She has no wheezes.  Abdominal: Soft. Bowel sounds are normal. There is  no tenderness. There is no guarding.  Musculoskeletal: Normal range of motion. She exhibits no edema.  Neurological: She is alert and oriented to person, place, and time.  Skin: Skin is warm and dry. She is not diaphoretic.  Psychiatric: She has a normal mood and affect.    ED Course  Procedures (including critical care time) Labs Review  Labs Reviewed  COMPREHENSIVE METABOLIC PANEL - Abnormal; Notable for the following:    Potassium 3.2 (*)    Glucose, Bld 119 (*)    Creatinine, Ser 1.49 (*)    Albumin 3.1 (*)    GFR calc non Af Amer 41 (*)    GFR calc Af Amer 48 (*)    All other components within normal limits  CBC WITH DIFFERENTIAL - Abnormal; Notable for the following:    WBC 13.0 (*)    RBC 3.54 (*)    Hemoglobin 8.3 (*)    HCT 27.3 (*)    MCV 77.1 (*)    MCH 23.4 (*)    RDW 17.4 (*)    Neutro Abs 9.7 (*)    Lymphocytes Relative 10 (*)    Monocytes Relative 15 (*)    Monocytes Absolute 1.9 (*)    All other components within normal limits  URINALYSIS, ROUTINE W REFLEX MICROSCOPIC - Abnormal; Notable for the following:    APPearance TURBID (*)    Leukocytes, UA MODERATE (*)    All other components within normal limits  URINE MICROSCOPIC-ADD ON - Abnormal; Notable for the following:    Squamous Epithelial / LPF MANY (*)    Bacteria, UA MANY (*)    All other components within normal limits  CLOSTRIDIUM DIFFICILE BY PCR  URINE CULTURE  LIPASE, BLOOD  POCT PREGNANCY, URINE  CG4 I-STAT (LACTIC ACID)   Imaging Review Dg Abd Acute W/chest  04/05/2013   *RADIOLOGY REPORT*  Clinical Data: Diarrhea.  Fever.  ACUTE ABDOMEN SERIES (ABDOMEN 2 VIEW & CHEST 1 VIEW)  Comparison: PA and lateral chest 02/02/2013.  Findings: Single view of the chest demonstrates clear lungs and normal heart size.  No pneumothorax or pleural fluid.  Two views of the  abdomen show a normal bowel gas pattern.  There is no free intraperitoneal air.  No abnormal abdominal calcification is seen.  There is no  focal bony abnormality.  IMPRESSION: Negative exam.   Original Report Authenticated By: Orlean Patten, M.D.    MDM   1. UTI (lower urinary tract infection)   2. Diarrhea   3. Nausea & vomiting     Labs as above-- mild leukocytosis at 13.0, slight bump in SrCr from previous (1.49, up from 1.05). Mild hypokalemia at 3.2-- 40 KCl given.  Lactic acid WNL.  U/a with infection vs contamination.  Last urine culture sensitive to rocephin, 1g started in the ED.  Nausea/vomiting well controlled with IVF and zofran.  C-diff PCR pending.  Given transplant hx will plan admission.  Consulted IM, Dr. Wayne Sever-- she will admit.  VS stable.  Larene Pickett, PA-C 04/05/13 [place], PA-C 04/05/13 1925  Medical screening examination/treatment/procedure(s) were conducted as a shared visit with non-physician practitioner(s) or resident  and myself.  I personally evaluated the patient during the encounter and agree with the findings and plan unless otherwise indicated.    Recurrent diarrhea, n/v for 2 wks.  Kidney transplant pt, pt only missed today's medicines. No recent travel or abx.  Fevers.  Mild right flank pain on exam, abd soft/ nd, dry mm, non toxic appearing. Fluids given in ED. Pt improved.  Plan for admission with high risk hx of transplant rejection.   Dehydration, ARF, Diarrhea, UTI, Renal transplant   Mariea Clonts, MD 04/05/13 2111

## 2013-04-06 ENCOUNTER — Encounter (HOSPITAL_COMMUNITY): Payer: Self-pay | Admitting: *Deleted

## 2013-04-06 DIAGNOSIS — A088 Other specified intestinal infections: Principal | ICD-10-CM

## 2013-04-06 LAB — BASIC METABOLIC PANEL
GFR calc non Af Amer: 61 mL/min — ABNORMAL LOW (ref >90)
Glucose, Bld: 94 mg/dL (ref 70–99)
Potassium: 3.7 mEq/L (ref 3.5–5.1)
Sodium: 140 mEq/L (ref 135–145)

## 2013-04-06 LAB — CBC
Hemoglobin: 7.9 g/dL — ABNORMAL LOW (ref 12.0–15.0)
MCH: 23.7 pg — ABNORMAL LOW (ref 26.0–34.0)
RBC: 3.34 MIL/uL — ABNORMAL LOW (ref 3.87–5.11)
WBC: 12.9 10*3/uL — ABNORMAL HIGH (ref 4.0–10.5)

## 2013-04-06 MED ORDER — HYDROCODONE-ACETAMINOPHEN 5-325 MG PO TABS
1.0000 | ORAL_TABLET | Freq: Four times a day (QID) | ORAL | Status: DC | PRN
  Administered 2013-04-06 – 2013-04-07 (×4): 1 via ORAL
  Filled 2013-04-06 (×5): qty 1

## 2013-04-06 MED ORDER — TACROLIMUS 1 MG PO CAPS
10.0000 mg | ORAL_CAPSULE | Freq: Every day | ORAL | Status: DC
  Administered 2013-04-06 – 2013-04-08 (×3): 10 mg via ORAL
  Filled 2013-04-06 (×3): qty 10

## 2013-04-06 MED ORDER — GI COCKTAIL ~~LOC~~
30.0000 mL | Freq: Once | ORAL | Status: AC
  Administered 2013-04-06: 23:00:00 30 mL via ORAL
  Filled 2013-04-06: qty 30

## 2013-04-06 MED ORDER — TACROLIMUS 1 MG PO CAPS
9.0000 mg | ORAL_CAPSULE | Freq: Every day | ORAL | Status: DC
  Administered 2013-04-06 – 2013-04-07 (×3): 9 mg via ORAL
  Filled 2013-04-06 (×5): qty 9

## 2013-04-06 NOTE — Progress Notes (Signed)
Subjective:  Pt reports feeling about the same as yesterday.  She states she has diarrhea only when she eats and no other time.  She denies any recent travel, exposure to pets, or recent sick contacts.  Pt is not very responsive to our questions.  Objective: Vital signs in last 24 hours: Filed Vitals:   04/06/13 0011 04/06/13 0553 04/06/13 1728 04/06/13 2109  BP: 122/76 118/70 128/68 134/81  Pulse: 100 97 90 91  Temp:  99.9 F (37.7 C) 98.8 F (37.1 C) 98.1 F (36.7 C)  TempSrc:  Oral Oral Oral  Resp: 19 18 20 18   Height:      Weight:      SpO2: 100% 99% 100% 100%   Weight change:   Intake/Output Summary (Last 24 hours) at 04/06/13 2134 Last data filed at 04/06/13 1900  Gross per 24 hour  Intake   2895 ml  Output    700 ml  Net   2195 ml   Physical Exam  Constitutional: She is oriented to person, place, and time and well-developed, well-nourished, and in no distress.  HENT:  Head: Normocephalic and atraumatic.  Eyes: Conjunctivae and EOM are normal. Pupils are equal, round, and reactive to light.  Cardiovascular: Normal rate, regular rhythm, normal heart sounds and intact distal pulses.   Pulmonary/Chest: Effort normal and breath sounds normal.  Abdominal: Soft. Bowel sounds are normal. There is tenderness in the suprapubic area. There is CVA tenderness. There is no rebound.  Neurological: She is alert and oriented to person, place, and time.  Skin: Skin is warm and dry.  Psychiatric: She has a flat affect.    Lab Results: Basic Metabolic Panel:  Recent Labs Lab 04/05/13 1011 04/06/13 0555  NA 135 140  K 3.2* 3.7  CL 99 107  CO2 26 23  GLUCOSE 119* 94  BUN 17 11  CREATININE 1.49* 1.08  CALCIUM 9.3 9.5  MG 2.0  --   PHOS 4.5  --    Liver Function Tests:  Recent Labs Lab 04/05/13 1011  AST 10  ALT 10  ALKPHOS 50  BILITOT 0.3  PROT 6.8  ALBUMIN 3.1*    Recent Labs Lab 04/05/13 1011  LIPASE 21   CBC:  Recent Labs Lab 04/05/13 1011  04/06/13 0555  WBC 13.0* 12.9*  NEUTROABS 9.7*  --   HGB 8.3* 7.9*  HCT 27.3* 25.6*  MCV 77.1* 76.6*  PLT 225 235   Urinalysis:  Recent Labs Lab 04/05/13 1410  COLORURINE YELLOW  LABSPEC 1.011  PHURINE 5.5  GLUCOSEU NEGATIVE  HGBUR NEGATIVE  BILIRUBINUR NEGATIVE  KETONESUR NEGATIVE  PROTEINUR NEGATIVE  UROBILINOGEN 0.2  NITRITE NEGATIVE  LEUKOCYTESUR MODERATE*   Studies/Results: Dg Abd Acute W/chest  04/05/2013   *RADIOLOGY REPORT*  Clinical Data: Diarrhea.  Fever.  ACUTE ABDOMEN SERIES (ABDOMEN 2 VIEW & CHEST 1 VIEW)  Comparison: PA and lateral chest 02/02/2013.  Findings: Single view of the chest demonstrates clear lungs and normal heart size.  No pneumothorax or pleural fluid.  Two views of the abdomen show a normal bowel gas pattern.  There is no free intraperitoneal air.  No abnormal abdominal calcification is seen.  There is no focal bony abnormality.  IMPRESSION: Negative exam.   Original Report Authenticated By: Orlean Patten, M.D.   Medications:  Scheduled Meds: . cefTRIAXone (ROCEPHIN)  IV  1 g Intravenous Q24H  . ferrous sulfate  325 mg Oral Q breakfast  . heparin  5,000 Units Subcutaneous Q8H  .  mycophenolate  1,000 mg Oral BID  . tacrolimus  10 mg Oral Daily  . tacrolimus  9 mg Oral QHS   Continuous Infusions: . sodium chloride 125 mL/hr at 04/06/13 1304   PRN Meds:.acetaminophen, acetaminophen, HYDROcodone-acetaminophen Assessment/Plan:  1. Acute Kidney Injury: Pt AKI is pre-renal secondary to hypovolemia in the setting of diarrhea, N/V, and decreased po intake.  Pt creatinine trended down to nl range since fluid challenge.   - Continue to optimize fluid status  - Continue home mycophenolate and tacrolimus  - Hold anti-HTN's including furosemide   2. Acute Pyelonephritis with leukocytosis (12.9):  Afebrile; pt still c/o suprapubic and CVA tenderness.     - IV Ceftriaxone  - F/U UCx, BC  3.  Diarrhea: Diarrhea is likely secondary to viral  gastroenteritis but possible bacterial or parasitic; pt also reports recent antibiotic use.     - f/u GI Pathogen Panel  - Treat symptomatically with fluid replacement.   4. Hypertension: BP currently 134/81.    - Hold home meds due to pt being hypovolemic and in the setting of AKI. - Continue to monitor  5. S/P Kidney Transplant:   - Continue home tacrolimus and mycophenolate   6. Microcytic Anemia: stable.   Dispo: Disposition is deferred at this time, awaiting improvement of current medical problems.  Anticipated discharge in approximately 1-2 day(s).   The patient does have a current PCP Placido Sou, MD) and does need an [facility] hospital follow-up appointment after discharge.  .Services Needed at time of discharge: Y = Yes, Blank = No PT:   OT:   RN:   Equipment:   Other:     LOS: 1 day   Michail Jewels, MD 04/06/2013, 9:34 PM

## 2013-04-06 NOTE — Progress Notes (Signed)
Utilization review completed.  

## 2013-04-06 NOTE — H&P (Signed)
I saw and evaluated the patient. I personally confirmed the key portions of Dr. Laroy Apple history and exam and reviewed pertinent patient test results. The assessment, diagnosis, and plan were reviewed with the housestaff and I agree with the documentation in the resident's note.  She presents with 2 weeks of diarrhea after a course of antibiotics.  This is concerning for C. diff and a stool sample has been sent to assess.  Because of her immunosuppression, other etiologies could cause her symptoms and a GI pathogen panel has been sent.  Her acute renal failure is likely secondary to pre-renal azotemia from the diarrhea and total body volume loss.  The creatinine has improved quickly with IV fluid resuscitation.  We will continue to follow serial creatinines and avoid nephrotoxic agents.  Finally, she has some symptoms consistent with a urinary tract infection and she is being treated empirically for this.  I am concerned that the initial culture may be contaminated and the follow-up culture was obtained after antibiotics and therefore may show no growth.  None-the-less, we will follow-up on the results of the urine culture.

## 2013-04-07 LAB — BASIC METABOLIC PANEL
Chloride: 105 mEq/L (ref 96–112)
Creatinine, Ser: 0.94 mg/dL (ref 0.50–1.10)
GFR calc Af Amer: 84 mL/min — ABNORMAL LOW (ref >90)
Potassium: 3.8 mEq/L (ref 3.5–5.1)
Sodium: 138 mEq/L (ref 135–145)

## 2013-04-07 LAB — CBC
HCT: 30.2 % — ABNORMAL LOW (ref 36.0–46.0)
RDW: 17.4 % — ABNORMAL HIGH (ref 11.5–15.5)
WBC: 9 10*3/uL (ref 4.0–10.5)

## 2013-04-07 NOTE — Progress Notes (Signed)
S: Patient reports epigastric discomfort after eating. Denies N/V. Similar to previous episodes of GERD  O: VSS, mildly tender to palpation in the epigastrium. Belly soft and nontender.  A/P: Ordered GI cocktail as this has worked for patient at previous admission.

## 2013-04-07 NOTE — Progress Notes (Signed)
Internal Medicine Attending  Date: 04/07/2013  Patient name: Ruth Gutierrez Medical record number: [id number] Date of birth: 15-Feb-1966 Age: 47 y.o. Gender: female  I saw and evaluated the patient. I reviewed the resident's note by Dr. Stann Mainland and I agree with the resident's findings and plans as documented in her progress note.  Diarrhea is decreased in frequency but persists.  Renal function has returned to normal with volume resuscitation.  Still awaiting for C. diff result.  As this is a major concern of ours we will defer discharge until this is resulted or the patient has no more diarrhea.

## 2013-04-07 NOTE — Progress Notes (Signed)
Subjective: Pt feels better this morning, requesting to go home.  She continues to have diarrhea "that looks like what she just ate" but frequency has decreased some.  Pt also had an episode of abdominal pain last night, relieved with GI cocktail, no resolved.  She is drinking lots of water to stay hydrated, no dizziness with standing.  No chest pain, shortness of breath.   Objective: Vital signs in last 24 hours: Filed Vitals:   04/06/13 1728 04/06/13 2109 04/07/13 0505 04/07/13 0740  BP: 128/68 134/81 121/79 138/81  Pulse: 90 91 62 91  Temp: 98.8 F (37.1 C) 98.1 F (36.7 C) 98.8 F (37.1 C) 98.4 F (36.9 C)  TempSrc: Oral Oral  Oral  Resp: 20 18 18 18   Height:      Weight:      SpO2: 100% 100% 100% 100%   Weight change:   Intake/Output Summary (Last 24 hours) at 04/07/13 1405 Last data filed at 04/07/13 0846  Gross per 24 hour  Intake   1790 ml  Output    300 ml  Net   1490 ml   Physical Exam  Constitutional: She is oriented to person, place, and time and well-developed, well-nourished, and in no distress.  HENT:  Head: Normocephalic and atraumatic.  Eyes: Conjunctivae and EOM are normal. Pupils are equal, round, and reactive to light.  Cardiovascular: Normal rate, regular rhythm, normal heart sounds and intact distal pulses.   Pulmonary/Chest: Effort normal and breath sounds normal.  Abdominal: Soft. Bowel sounds are normal. There is no rebound.  Neurological: She is alert and oriented to person, place, and time.  Skin: Skin is warm and dry.  Psychiatric: Affect normal.    Lab Results: Basic Metabolic Panel:  Recent Labs Lab 04/05/13 1011 04/06/13 0555 04/07/13 1000  NA 135 140 138  K 3.2* 3.7 3.8  CL 99 107 105  CO2 26 23 22   GLUCOSE 119* 94 94  BUN 17 11 8   CREATININE 1.49* 1.08 0.94  CALCIUM 9.3 9.5 9.7  MG 2.0  --   --   PHOS 4.5  --   --    Liver Function Tests:  Recent Labs Lab 04/05/13 1011  AST 10  ALT 10  ALKPHOS 50  BILITOT 0.3    PROT 6.8  ALBUMIN 3.1*    Recent Labs Lab 04/05/13 1011  LIPASE 21   CBC:  Recent Labs Lab 04/05/13 1011 04/06/13 0555 04/07/13 1000  WBC 13.0* 12.9* 9.0  NEUTROABS 9.7*  --   --   HGB 8.3* 7.9* 9.1*  HCT 27.3* 25.6* 30.2*  MCV 77.1* 76.6* 77.6*  PLT 225 235 244   Urinalysis:  Recent Labs Lab 04/05/13 1410  COLORURINE YELLOW  LABSPEC 1.011  PHURINE 5.5  GLUCOSEU NEGATIVE  HGBUR NEGATIVE  BILIRUBINUR NEGATIVE  KETONESUR NEGATIVE  PROTEINUR NEGATIVE  UROBILINOGEN 0.2  NITRITE NEGATIVE  LEUKOCYTESUR MODERATE*   Studies/Results: Dg Abd Acute W/chest  04/05/2013   *RADIOLOGY REPORT*  Clinical Data: Diarrhea.  Fever.  ACUTE ABDOMEN SERIES (ABDOMEN 2 VIEW & CHEST 1 VIEW)  Comparison: PA and lateral chest 02/02/2013.  Findings: Single view of the chest demonstrates clear lungs and normal heart size.  No pneumothorax or pleural fluid.  Two views of the abdomen show a normal bowel gas pattern.  There is no free intraperitoneal air.  No abnormal abdominal calcification is seen.  There is no focal bony abnormality.  IMPRESSION: Negative exam.   Original Report Authenticated By: Orlean Patten,  M.D.   Medications:  Scheduled Meds: . cefTRIAXone (ROCEPHIN)  IV  1 g Intravenous Q24H  . ferrous sulfate  325 mg Oral Q breakfast  . heparin  5,000 Units Subcutaneous Q8H  . mycophenolate  1,000 mg Oral BID  . tacrolimus  10 mg Oral Daily  . tacrolimus  9 mg Oral QHS     PRN Meds:.acetaminophen, acetaminophen, HYDROcodone-acetaminophen Assessment/Plan: 1.  Diarrhea: Likely secondary to viral gastroenteritis but possible bacterial or parasitic; pt also reports recent antibiotic use.  Pharmacy pointed out that mycophenyolate and tacrolimus together can cause diarrhea, will include in discharge summary for nephrology to evaluate.  -follow-up GI Pathogen Panel (pending) -will send separate C. Diff today if pt can provide stool sample as this is main issue keeping her in hospital    2. Acute Kidney Injury, resolved: likely pre-renal etiology secondary to hypovolemia in the setting of diarrhea, N/V, and decreased po intake.  Cr 0.94 today. -discontinue fluids, pt with normal Cr and good PO intake now -continue home mycophenolate and tacrolimus  -continue holding anti-hypertensives including furosemide   3. Acute Pyelonephritis with leukocytosis (13 on 9/2):  Afebrile, WBC trending down (9.0 today). Pt not complaining of urinary symptoms today.  -continue IV Ceftriaxone  -follow-up urine cx (pending); b cx x2 NGTD  4. Hypertension: BP 121/79 today.   -continue holding home meds due to pt previously being hypovolemic   5. S/P Kidney Transplant:  -continue home tacrolimus and mycophenolate   6. Microcytic Anemia: stable.   Dispo: Disposition today or tomorrow, depending on whether we are able to obtain another stool sample (and assuming tests negative for C. Diff).   The patient does have a current PCP Placido Sou, MD) and does need an [facility] hospital follow-up appointment after discharge.  .Services Needed at time of discharge: Y = Yes, Blank = No PT:   OT:   RN:   Equipment:   Other:     LOS: 2 days   Ivin Poot, MD 04/07/2013, 2:05 PM  D/c fluids Urine cx pending, b. cx NGTD GI panel in process, ordered separate C. Diff today

## 2013-04-08 LAB — GI PATHOGEN PANEL BY PCR, STOOL
Cryptosporidium by PCR: NEGATIVE
E coli (ETEC) LT/ST: NEGATIVE
E coli 0157 by PCR: NEGATIVE
Salmonella by PCR: NEGATIVE
Shigella by PCR: NEGATIVE

## 2013-04-08 LAB — URINE CULTURE

## 2013-04-08 MED ORDER — ONDANSETRON 4 MG PO TBDP
4.0000 mg | ORAL_TABLET | Freq: Once | ORAL | Status: DC
  Filled 2013-04-08: qty 1

## 2013-04-08 NOTE — Progress Notes (Signed)
Ms. Ruth Gutierrez was seen on morning rounds.  She feels much improved and has not had a bowel movement to allow [place] to send a sample for C. Diff, suggesting that she does not have C. Diff diarrhea.  Our Pharmacist has done some research on her tacrolimus and mycophenolate and it appears this combination can result in an intermittent diarrhea which is what Ruth Gutierrez has been suffering from.  If possible, an adjustment in the doses of her anti-rejection regimen could decrease the intermittent diarrhea if they are the culprit.  As this may or may not be possible or prudent, the concept and decision will be left to her transplant nephrologist.  With no more diarrhea and successful fluid resuscitation with normalization of renal function, we will discharge Ruth Gutierrez home today.

## 2013-04-08 NOTE — Progress Notes (Signed)
Discussed discharge instructions and medications with pt. Pt showed no barriers to discharge. IV removed. Pt discharged to home with family. Assessment unchanged from morning. Note from doctor given to pt.

## 2013-04-08 NOTE — Care Management Note (Deleted)
   CARE MANAGEMENT NOTE 04/08/2013  Patient:  Ruth Gutierrez   Account Number:  [id number]  Date Initiated:  04/05/2013  Documentation initiated by:  Olga Coaster  Subjective/Objective Assessment:   ADMITTED WITH ALTERED MENTAL STATUS     Action/Plan:   CM FOLLOWING FOR DCP  Pt with new onset DM, requiring Insulin, pt refusing at times even with much explanation and encouragement.   Anticipated DC Date:  04/08/2013   Anticipated DC Plan:  [place]  CM consult      Choice offered to / List presented to:          [facility] arranged  HH-1 RN      [place].   Status of service:  Completed, signed off Medicare Important Message given?  NO (If response is "NO", the following Medicare IM given date fields will be blank) Date Medicare IM given:   Date Additional Medicare IM given:    Discharge Disposition:  [place]  Per UR Regulation:  Reviewed for med. necessity/level of care/duration of stay  If discussed at [place] of Stay Meetings, dates discussed:    Comments:  04/08/2013 Pt for d/c to home today, follow-up appointment scheduled with [facility] for Sept 18, 2014 @ 11:30am, info given to wife and number for Health connect for pt or wife to call for name of local MD's accepting patients. Spoke with wife re appointment and [facility] services for [facility] to check on pt and assist wife with any issues she may have re use of meter or adm Insulin. Dr Wynetta Emery notifed of plan and will order [facility]. Jasmine Pang RN MPH, case manager, [phone]   9/2/2014Mindi Slicker RN,BSN,MHA

## 2013-04-08 NOTE — Discharge Summary (Signed)
Name: Ruth Gutierrez MRN: [id number] DOB: 04/01/1966 47 y.o. PCP: Placido Sou, MD  Date of Admission: 04/05/2013 11:34 AM Date of Discharge: 04/08/2013 Attending Physician: Karren Cobble, MD  Discharge Diagnosis: Principal Problem:   Acute renal failure Active Problems:   Iron deficiency anemia   Hypertension   S/P kidney transplant   Tobacco abuse   Acute pyelonephritis   Diarrhea  Discharge Medications:   Medication List    STOP taking these medications       ALEVE PO      TAKE these medications       amLODipine 10 MG tablet  Commonly known as:  NORVASC  Take 10 mg by mouth daily.     aspirin 81 MG tablet  Take 81 mg by mouth daily.     atenolol 50 MG tablet  Commonly known as:  TENORMIN  Take 50 mg by mouth 2 (two) times daily.     docusate sodium 100 MG capsule  Commonly known as:  COLACE  Take 100 mg by mouth 2 (two) times daily as needed for constipation.     ferrous sulfate 325 (65 FE) MG tablet  Take 325 mg by mouth daily. Take 2 tabs by mouth twice daily.     furosemide 40 MG tablet  Commonly known as:  LASIX  Take 40 mg by mouth 2 (two) times daily.     KLOR-CON M20 20 MEQ tablet  Generic drug:  potassium chloride SA  Take 20 mEq by mouth 2 (two) times daily.     mycophenolate 250 MG capsule  Commonly known as:  CELLCEPT  Take 1,000 mg by mouth 2 (two) times daily. Take 4 capsules by mouth twice daily.     tacrolimus 1 MG capsule  Commonly known as:  PROGRAF  Take 9-10 mg by mouth 2 (two) times daily. Take 10 capsules in the morning and takes 9 capsules at night        Disposition and follow-up:   [name] was discharged from [facility] in Good condition.  At the hospital follow up visit please address:  1.  Follow up on diarrhea and pyelonephritis  2.  Labs / imaging needed at time of follow-up: BMET to check renal function    3.  Pending labs/ test needing follow-up: Patient had stool  pathogen panel which was positive for rotavirus  Follow-up Appointments:     Follow-up Information   Follow up with Duwaine Maxin, DO. Schedule an appointment as soon as possible for a visit on 04/14/2013. (10:30am; please arrive 15 minutes prior to visit)    Specialty:  Internal Medicine   Contact information:   [place] [address] [phone]       Discharge Instructions: Discharge Orders   Future Appointments Provider Department Dept Phone   04/14/2013 10:30 AM Duwaine Maxin, DO [place] [phone]   Future Orders Complete By Expires   Call MD for:  persistant nausea and vomiting  As directed    Call MD for:  temperature >100.4  As directed    Diet - low sodium heart healthy  As directed    Increase activity slowly  As directed       Consultations:    Procedures Performed:  Dg Abd Acute W/chest  04/05/2013   *RADIOLOGY REPORT*  Clinical Data: Diarrhea.  Fever.  ACUTE ABDOMEN SERIES (ABDOMEN 2 VIEW & CHEST 1 VIEW)  Comparison: PA and lateral chest  02/02/2013.  Findings: Single view of the chest demonstrates clear lungs and normal heart size.  No pneumothorax or pleural fluid.  Two views of the abdomen show a normal bowel gas pattern.  There is no free intraperitoneal air.  No abnormal abdominal calcification is seen.  There is no focal bony abnormality.  IMPRESSION: Negative exam.   Original Report Authenticated By: Orlean Patten, M.D.    2D Echo: None  Cardiac Cath: None  Admission HPI: Ruth Gutierrez is a 47 y.o. female with a pmhx of ESRD s/p DDKT in 2010 who comes to the hospital with a cc of diarrhea. The patient was in her normal state of health until 2 weeks ago when she began to have constant diarrhea with 3-4 loose, nonbloody BMs per day. This is a similar presentation as last admission; has not tried any medications to alleviate diarrhea, has not been seen by outpatient MD.  The patient has associated symptoms of  nausea, vomiting, dizziness, and headache. She reports at least 1-2 episodes of emesis yesterday, as well as increasing dizziness and light headedness over the last 4 days. On the day of admission, she has had fatigue leading her to be in bed all day, ran a temperature of 103 and started have back aches in the lumbar spine above the PSIS. She tried aleve for pain with some relief. Of note, she reports compliance with cellcept, prograf, atenolol, amlodipine, lasix (BID). She is able to tolerate food and fluids, but admits to decreased oral intake over the last few days. Denies vaginal discharge, no malodorous urine. She cannot quantify amount of urinary output. Denies SOB, edema, CP   Hospital Course by problem list: Principal Problem:   Acute renal failure-  Resolved. Pt AKI likely pre-renal and 2/2 hypovolemia. The patients hypovolemia is likely multifactorial resulting from diarrhea, decreased PO intake, and vomitting. Furthermore, the patient endorses orthostatic symptoms. UA was negative for blood, ketones, protein, spec gravity is 1.011. Intrinsic renal failure etiologies remain possible, but less likely. May consider further w/u if not responsive to fluid replacement. However, during her hospital stay her creatinine trended down with fluid replacement of NS at 124ml/hr and a bolus in the ED.  We also head any antihypertensives including furosemide.   Additional Problems:   Iron deficiency anemia- Stable.   Hypertension- Stable. We held home meds due to pt being hypovolemic and in the setting of AKI; will resume upon discharge.   S/P kidney transplant- Stable. We continued home meds.   Tobacco abuse- Chronic; pt not interested in quitting at this time.   Acute pyelonephritis- Pt was febrile upon admission and c/o suprapubic and CVA tenderness. She was treated with IV ceftriaxone. Additionally, blood cultures and urine cultures were collected which showed no growth in blood cultures, but urine grew  group B streptococcus agalactiae.    Diarrhea- Resolved. Pt began 2 weeks ago having constant diarrhea with 3-4 loose, nonbloody BMs per day. This is a similar presentation as last admission; has not tried any medications to alleviate diarrhea, has not been seen by outpatient MD.  The patient has associated symptoms of nausea, vomiting, dizziness, and headache. She reports at least 1-2 episodes of emesis yesterday, as well as increasing dizziness and light headedness over the last 4 days. On the day of admission, she has had fatigue leading her to be in bed all day, ran a temperature of 103 and started having back aches in the lumbar spine above the PSIS. She tried aleve for pain  with some relief. During her hospital stay she had several episodes of diarrhea which had resolved by discharge. She was treated symptomatically with fluid replacement. She was tolerating po intake and eating and drinking well upon discharge. We collected a stool sample and ran a pathogen panel on it which turned out negative except for rotavirus was positive.      Discharge Vitals:   BP 118/67  Pulse 91  Temp(Src) 98.5 F (36.9 C) (Oral)  Resp 18  Ht 5\' 7"  (1.702 m)  Wt 86.5 kg (190 lb 11.2 oz)  BMI 29.86 kg/m2  SpO2 100%  LMP 03/15/2013  Signed: Michail Jewels, MD 04/08/2013, 12:07 PM   Time Spent on Discharge: 40 minutes Services Ordered on Discharge: None Equipment Ordered on Discharge: None

## 2013-04-08 NOTE — Discharge Instructions (Signed)
Please come to the ED for severe, persistent diarrhea or nausea and vomiting    Diet for Diarrhea, Adult Frequent, runny stools (diarrhea) may be caused or worsened by food or drink. Diarrhea may be relieved by changing your diet. Since diarrhea can last up to 7 days, it is easy for you to lose too much fluid from the body and become dehydrated. Fluids that are lost need to be replaced. Along with a modified diet, make sure you drink enough fluids to keep your urine clear or pale yellow. DIET INSTRUCTIONS  Ensure adequate fluid intake (hydration): have 1 cup (8 oz) of fluid for each diarrhea episode. Avoid fluids that contain simple sugars or sports drinks, fruit juices, whole milk products, and sodas. Your urine should be clear or pale yellow if you are drinking enough fluids. Hydrate with an oral rehydration solution that you can purchase at pharmacies, retail stores, and online. You can prepare an oral rehydration solution at home by mixing the following ingredients together:    tsp table salt.   tsp baking soda.   tsp salt substitute containing potassium chloride.  1  tablespoons sugar.  1 L (34 oz) of water.  Certain foods and beverages may increase the speed at which food moves through the gastrointestinal (GI) tract. These foods and beverages should be avoided and include:  Caffeinated and alcoholic beverages.  High-fiber foods, such as raw fruits and vegetables, nuts, seeds, and whole grain breads and cereals.  Foods and beverages sweetened with sugar alcohols, such as xylitol, sorbitol, and mannitol.  Some foods may be well tolerated and may help thicken stool including:  Starchy foods, such as rice, toast, pasta, low-sugar cereal, oatmeal, grits, baked potatoes, crackers, and bagels.   Bananas.   Applesauce.  Add probiotic-rich foods to help increase healthy bacteria in the GI tract, such as yogurt and fermented milk products. RECOMMENDED FOODS AND  BEVERAGES Starches Choose foods with less than 2 g of fiber per serving.  Recommended:  White, [place], and pita breads, plain rolls, buns, bagels. Plain muffins, matzo. Soda, saltine, or graham crackers. Pretzels, melba toast, zwieback. Cooked cereals made with water: cornmeal, farina, cream cereals. Dry cereals: refined corn, wheat, rice. Potatoes prepared any way without skins, refined macaroni, spaghetti, noodles, refined rice.  Avoid:  Bread, rolls, or crackers made with whole wheat, multi-grains, rye, bran seeds, nuts, or coconut. Corn tortillas or taco shells. Cereals containing whole grains, multi-grains, bran, coconut, nuts, raisins. Cooked or dry oatmeal. Coarse wheat cereals, granola. Cereals advertised as "high-fiber." Potato skins. Whole grain pasta, wild or brown rice. Popcorn. Sweet potatoes, yams. Sweet rolls, doughnuts, waffles, pancakes, sweet breads. Vegetables  Recommended: Strained tomato and vegetable juices. Most well-cooked and canned vegetables without seeds. Fresh: Tender lettuce, cucumber without the skin, cabbage, spinach, bean sprouts.  Avoid: Fresh, cooked, or canned: Artichokes, baked beans, beet greens, broccoli, Brussels sprouts, corn, kale, legumes, peas, sweet potatoes. Cooked: Green or red cabbage, spinach. Avoid large servings of any vegetables because vegetables shrink when cooked, and they contain more fiber per serving than fresh vegetables. Fruit  Recommended: Cooked or canned: Apricots, applesauce, cantaloupe, cherries, fruit cocktail, grapefruit, grapes, kiwi, mandarin oranges, peaches, pears, plums, watermelon. Fresh: Apples without skin, ripe banana, grapes, cantaloupe, cherries, grapefruit, peaches, oranges, plums. Keep servings limited to  cup or 1 piece.  Avoid: Fresh: Apples with skin, apricots, mangoes, pears, raspberries, strawberries. Prune juice, stewed or dried prunes. Dried fruits, raisins, dates. Large servings of all fresh  fruits. Protein  Recommended: Ground or well-cooked tender beef, ham, veal, lamb, pork, or poultry. Eggs. Fish, oysters, shrimp, lobster, other seafoods. Liver, organ meats.  Avoid: Tough, fibrous meats with gristle. Peanut butter, smooth or chunky. Cheese, nuts, seeds, legumes, dried peas, beans, lentils. Dairy  Recommended: Yogurt, lactose-free milk, kefir, drinkable yogurt, buttermilk, soy milk, or plain hard cheese.  Avoid: Milk, chocolate milk, beverages made with milk, such as milkshakes. Soups  Recommended: Bouillon, broth, or soups made from allowed foods. Any strained soup.  Avoid: Soups made from vegetables that are not allowed, cream or milk-based soups. Desserts and Sweets  Recommended: Sugar-free gelatin, sugar-free frozen ice pops made without sugar alcohol.  Avoid: Plain cakes and cookies, pie made with fruit, pudding, custard, cream pie. Gelatin, fruit, ice, sherbet, frozen ice pops. Ice cream, ice milk without nuts. Plain hard candy, honey, jelly, molasses, syrup, sugar, chocolate syrup, gumdrops, marshmallows. Fats and Oils  Recommended: Limit fats to less than 8 tsp per day.  Avoid: Seeds, nuts, olives, avocados. Margarine, butter, cream, mayonnaise, salad oils, plain salad dressings. Plain gravy, crisp bacon without rind. Beverages  Recommended: Water, decaffeinated teas, oral rehydration solutions, sugar-free beverages not sweetened with sugar alcohols.  Avoid: Fruit juices, caffeinated beverages (coffee, tea, soda), alcohol, sports drinks, or lemon-lime soda. Condiments  Recommended: Ketchup, mustard, horseradish, vinegar, cocoa powder. Spices in moderation: allspice, basil, bay leaves, celery powder or leaves, cinnamon, cumin powder, curry powder, ginger, mace, marjoram, onion or garlic powder, oregano, paprika, parsley flakes, ground pepper, rosemary, sage, savory, tarragon, thyme, turmeric.  Avoid: Coconut, honey. Document Released: 10/11/2003 Document  Revised: 04/14/2012 Document Reviewed: 12/05/2011 [facility] Patient Information 2014 [place].

## 2013-04-09 NOTE — Progress Notes (Signed)
Notified Dr. Margart Sickles that lab called to state that patient was positive for rotavirus. Patient was d/c 9/5. Ranelle Oyster, RN

## 2013-04-12 LAB — CULTURE, BLOOD (ROUTINE X 2): Culture: NO GROWTH

## 2013-04-14 ENCOUNTER — Encounter: Payer: Self-pay | Admitting: Internal Medicine

## 2013-04-14 ENCOUNTER — Ambulatory Visit (INDEPENDENT_AMBULATORY_CARE_PROVIDER_SITE_OTHER): Payer: BC Managed Care – PPO | Admitting: Internal Medicine

## 2013-04-14 VITALS — BP 136/85 | HR 90 | Temp 98.9°F | Ht 67.0 in | Wt 183.1 lb

## 2013-04-14 DIAGNOSIS — N10 Acute tubulo-interstitial nephritis: Secondary | ICD-10-CM

## 2013-04-14 DIAGNOSIS — I10 Essential (primary) hypertension: Secondary | ICD-10-CM

## 2013-04-14 DIAGNOSIS — F17200 Nicotine dependence, unspecified, uncomplicated: Secondary | ICD-10-CM

## 2013-04-14 DIAGNOSIS — Z940 Kidney transplant status: Secondary | ICD-10-CM

## 2013-04-14 DIAGNOSIS — Z720 Tobacco use: Secondary | ICD-10-CM

## 2013-04-14 MED ORDER — LEVOFLOXACIN 250 MG PO TABS: 250.0000 mg | ORAL_TABLET | Freq: Every day | ORAL | Status: AC

## 2013-04-14 NOTE — Assessment & Plan Note (Signed)
Patient continues to smoke. Counseled on smoking cessation.

## 2013-04-14 NOTE — Progress Notes (Addendum)
  Subjective:    Patient ID: Ruth Gutierrez, female    DOB: 04-19-1966, 47 y.o.   MRN: [id number]  HPI Comments: Ruth Gutierrez is a 47 year old woman with a PMH of HTN, hx of ESRD s/p DDKT in 2010 (followed by Dr. Jimmy Footman) who presents for hospital follow-up.  She was admitted for AKI after recent diarrheal illness.  She was afebrile but had +CVAT, suprapubic tenderness, N/V and urine culture + for GBS.  She was treated with IV cetriaxone.   Her AKI improved with fluids and her UTI/pyelo were treated with IV cetriaxone.  Her diarrhea resolved during admission.  Today she reports that her N/V had improved but she had an episode of emesis last night.  She has not had fever but feels chills at times.  She has R sided flank pain.  She denies suprapubic tenderness or dysuria.     Review of Systems  Constitutional: Positive for chills and appetite change. Negative for fever.  Gastrointestinal: Positive for nausea and vomiting. Negative for diarrhea.  Genitourinary: Positive for flank pain. Negative for dysuria and pelvic pain.  Neurological: Negative for headaches.       Objective:   Physical Exam  Constitutional: She is oriented to person, place, and time. She appears well-developed and well-nourished. No distress.  HENT:  Mouth/Throat: Oropharynx is clear and moist. No oropharyngeal exudate.  Eyes: Pupils are equal, round, and reactive to light.  Cardiovascular: Normal rate, regular rhythm and normal heart sounds.   Pulmonary/Chest: Effort normal and breath sounds normal. No respiratory distress. She has no wheezes. She has no rales.  Abdominal: Soft. Bowel sounds are normal. She exhibits no distension. There is tenderness.  + R flank and right abdomen TTP; no CVAT  Genitourinary:  + suprapubic TTP  Musculoskeletal: She exhibits no edema.  Neurological: She is alert and oriented to person, place, and time.  Skin: Skin is warm. She is not diaphoretic.  Psychiatric: She has a normal  mood and affect. Her behavior is normal.          Assessment & Plan:

## 2013-04-14 NOTE — Discharge Summary (Signed)
There is some literature that suggests the mycophenolate may cause intermittent diarrhea which is also consistent with her presentation of significant diarrhea requiring hospital admissions in July and now September.  If she were to have significant diarrhea in the future, without explanation, adjustment in the mycophenolate dose may be helpful if this is appropriate and possible from an immunosuppression standpoint.

## 2013-04-14 NOTE — Assessment & Plan Note (Addendum)
Assessment:  GBS+ urine culture in hospital.  Treated with IV Cetriaxone. Not d/c on oral antibiotic. Denies dysuria.  +Suprapubic tenderness. Afebrile today and no CVAT, however + chills, right flank pain and vomiting last night concerning for pyelonephritis.   Plan:    1) Repeat UA and urine culture today  2) Empiric treatment for complicated UTI (patient s/p kidney transplant) - Levaquin 250mg  daily x 10days  3) Patient to follow up with nephrologist (Dr. Jimmy Footman) next week  4) Patient instructed to go to the ER if she experiences back pain, fever, significant vomiting or any severe symptoms.

## 2013-04-14 NOTE — Patient Instructions (Addendum)
1. Please take all of the prescribed antibiotic for treatment of your kidney infection.  Keep your follow-up appointment with Dr. Jimmy Footman.     2. Please take all medications as prescribed.    3. If you have worsening of your symptoms or new symptoms arise, please call the clinic [phone]), or go to the ER immediately if symptoms are severe.  Pyelonephritis, Adult Pyelonephritis is a kidney infection. In general, there are 2 main types of pyelonephritis:  Infections that come on quickly without any warning (acute pyelonephritis).  Infections that persist for a long period of time (chronic pyelonephritis). CAUSES  Two main causes of pyelonephritis are:  Bacteria traveling from the bladder to the kidney. This is a problem especially in pregnant women. The urine in the bladder can become filled with bacteria from multiple causes, including:  Inflammation of the prostate gland (prostatitis).  Sexual intercourse in females.  Bladder infection (cystitis).  Bacteria traveling from the bloodstream to the tissue part of the kidney. Problems that may increase your risk of getting a kidney infection include:  Diabetes.  Kidney stones or bladder stones.  Cancer.  Catheters placed in the bladder.  Other abnormalities of the kidney or ureter. SYMPTOMS   Abdominal pain.  Pain in the side or flank area.  Fever.  Chills.  Upset stomach.  Blood in the urine (dark urine).  Frequent urination.  Strong or persistent urge to urinate.  Burning or stinging when urinating. DIAGNOSIS  Your caregiver may diagnose your kidney infection based on your symptoms. A urine sample may also be taken. TREATMENT  In general, treatment depends on how severe the infection is.   If the infection is mild and caught early, your caregiver may treat you with oral antibiotics and send you home.  If the infection is more severe, the bacteria may have gotten into the bloodstream. This will require  intravenous (IV) antibiotics and a hospital stay. Symptoms may include:  High fever.  Severe flank pain.  Shaking chills.  Even after a hospital stay, your caregiver may require you to be on oral antibiotics for a period of time.  Other treatments may be required depending upon the cause of the infection. HOME CARE INSTRUCTIONS   Take your antibiotics as directed. Finish them even if you start to feel better.  Make an appointment to have your urine checked to make sure the infection is gone.  Drink enough fluids to keep your urine clear or pale yellow.  Take medicines for the bladder if you have urgency and frequency of urination as directed by your caregiver. SEEK IMMEDIATE MEDICAL CARE IF:   You have a fever or persistent symptoms for more than 2-3 days.  You have a fever and your symptoms suddenly get worse.  You are unable to take your antibiotics or fluids.  You develop shaking chills.  You experience extreme weakness or fainting.  There is no improvement after 2 days of treatment. MAKE SURE YOU:  Understand these instructions.  Will watch your condition.  Will get help right away if you are not doing well or get worse. Document Released: 07/21/2005 Document Revised: 01/20/2012 Document Reviewed: 12/25/2010 [facility] Patient Information 2014 [place], [place].

## 2013-04-14 NOTE — Assessment & Plan Note (Signed)
Assessment: BP well controlled  Plan: Continue current regimen

## 2013-04-15 LAB — BASIC METABOLIC PANEL WITH GFR
BUN: 19 mg/dL (ref 6–23)
Chloride: 104 mEq/L (ref 96–112)
GFR, Est Non African American: 43 mL/min — ABNORMAL LOW
Glucose, Bld: 91 mg/dL (ref 70–99)
Potassium: 3.7 mEq/L (ref 3.5–5.3)

## 2013-04-15 LAB — URINALYSIS, ROUTINE W REFLEX MICROSCOPIC
Ketones, ur: NEGATIVE mg/dL
Nitrite: NEGATIVE
Specific Gravity, Urine: 1.012 (ref 1.005–1.030)
pH: 5 (ref 5.0–8.0)

## 2013-04-15 LAB — URINALYSIS, MICROSCOPIC ONLY: Casts: NONE SEEN

## 2013-04-15 LAB — URINE CULTURE
Colony Count: NO GROWTH
Organism ID, Bacteria: NO GROWTH

## 2013-04-18 ENCOUNTER — Telehealth: Payer: Self-pay | Admitting: Internal Medicine

## 2013-04-18 NOTE — Progress Notes (Signed)
I saw and evaluated the patient.  I personally confirmed the key portions of the history and exam documented by Dr. Redmond Pulling and I reviewed pertinent patient test results.  The assessment, diagnosis, and plan were formulated together and I agree with the documentation in the resident's note. Pt was found to have pyelo with urine cx + for GBS. We have started ABX (pt is PCN allergic) today.

## 2013-04-18 NOTE — Telephone Encounter (Signed)
Spoke with Ruth Gutierrez this evening.  Informed her that her creatinine was elevated 1.47 (04/14/13 labs).  She is scheduled to see Dr. Jimmy Footman tomorrow morning.

## 2013-04-28 ENCOUNTER — Other Ambulatory Visit (HOSPITAL_COMMUNITY): Payer: Self-pay | Admitting: *Deleted

## 2013-05-02 ENCOUNTER — Encounter (HOSPITAL_COMMUNITY)
Admission: RE | Admit: 2013-05-02 | Discharge: 2013-05-02 | Disposition: A | Discharge status: Home / Self Care | Payer: BC Managed Care – PPO | Source: Ambulatory Visit | Attending: Nephrology | Admitting: Nephrology

## 2013-05-02 DIAGNOSIS — D509 Iron deficiency anemia, unspecified: Secondary | ICD-10-CM | POA: Insufficient documentation

## 2013-05-02 MED ORDER — SODIUM CHLORIDE 0.9 % IV SOLN: Freq: Once | INTRAVENOUS | Status: DC

## 2013-05-02 MED ORDER — SODIUM CHLORIDE 0.9 % IV SOLN
1020.0000 mg | Freq: Once | INTRAVENOUS | Status: AC
  Administered 2013-05-02: 1020 mg via INTRAVENOUS
  Filled 2013-05-02: qty 34

## 2013-06-01 ENCOUNTER — Other Ambulatory Visit (HOSPITAL_COMMUNITY): Payer: Self-pay

## 2013-06-02 ENCOUNTER — Encounter (HOSPITAL_COMMUNITY)
Admission: RE | Admit: 2013-06-02 | Discharge: 2013-06-02 | Disposition: A | Discharge status: Home / Self Care | Payer: BC Managed Care – PPO | Source: Ambulatory Visit | Attending: Nephrology | Admitting: Nephrology

## 2013-06-02 DIAGNOSIS — D509 Iron deficiency anemia, unspecified: Secondary | ICD-10-CM | POA: Insufficient documentation

## 2013-06-02 MED ORDER — SODIUM CHLORIDE 0.9 % IV SOLN
1020.0000 mg | Freq: Once | INTRAVENOUS | Status: AC
  Administered 2013-06-02: 1020 mg via INTRAVENOUS
  Filled 2013-06-02: qty 34

## 2013-06-09 ENCOUNTER — Emergency Department (HOSPITAL_COMMUNITY)
Admission: EM | Admit: 2013-06-09 | Discharge: 2013-06-09 | Disposition: A | Discharge status: Home / Self Care | Payer: BC Managed Care – PPO | Attending: Emergency Medicine | Admitting: Emergency Medicine

## 2013-06-09 ENCOUNTER — Emergency Department (HOSPITAL_COMMUNITY): Payer: BC Managed Care – PPO

## 2013-06-09 ENCOUNTER — Other Ambulatory Visit: Payer: Self-pay

## 2013-06-09 ENCOUNTER — Encounter (HOSPITAL_COMMUNITY): Payer: Self-pay | Admitting: Emergency Medicine

## 2013-06-09 DIAGNOSIS — D631 Anemia in chronic kidney disease: Secondary | ICD-10-CM | POA: Insufficient documentation

## 2013-06-09 DIAGNOSIS — R197 Diarrhea, unspecified: Secondary | ICD-10-CM

## 2013-06-09 DIAGNOSIS — Z880 Allergy status to penicillin: Secondary | ICD-10-CM | POA: Insufficient documentation

## 2013-06-09 DIAGNOSIS — Z8719 Personal history of other diseases of the digestive system: Secondary | ICD-10-CM | POA: Insufficient documentation

## 2013-06-09 DIAGNOSIS — Z7982 Long term (current) use of aspirin: Secondary | ICD-10-CM | POA: Insufficient documentation

## 2013-06-09 DIAGNOSIS — Z79899 Other long term (current) drug therapy: Secondary | ICD-10-CM | POA: Insufficient documentation

## 2013-06-09 DIAGNOSIS — Z862 Personal history of diseases of the blood and blood-forming organs and certain disorders involving the immune mechanism: Secondary | ICD-10-CM | POA: Insufficient documentation

## 2013-06-09 DIAGNOSIS — Z8639 Personal history of other endocrine, nutritional and metabolic disease: Secondary | ICD-10-CM | POA: Insufficient documentation

## 2013-06-09 DIAGNOSIS — N390 Urinary tract infection, site not specified: Secondary | ICD-10-CM

## 2013-06-09 DIAGNOSIS — N186 End stage renal disease: Secondary | ICD-10-CM | POA: Insufficient documentation

## 2013-06-09 DIAGNOSIS — I120 Hypertensive chronic kidney disease with stage 5 chronic kidney disease or end stage renal disease: Secondary | ICD-10-CM | POA: Insufficient documentation

## 2013-06-09 DIAGNOSIS — Z940 Kidney transplant status: Secondary | ICD-10-CM

## 2013-06-09 DIAGNOSIS — R059 Cough, unspecified: Secondary | ICD-10-CM | POA: Insufficient documentation

## 2013-06-09 DIAGNOSIS — F17200 Nicotine dependence, unspecified, uncomplicated: Secondary | ICD-10-CM | POA: Insufficient documentation

## 2013-06-09 DIAGNOSIS — M129 Arthropathy, unspecified: Secondary | ICD-10-CM | POA: Insufficient documentation

## 2013-06-09 DIAGNOSIS — R05 Cough: Secondary | ICD-10-CM | POA: Insufficient documentation

## 2013-06-09 LAB — URINALYSIS, ROUTINE W REFLEX MICROSCOPIC
Bilirubin Urine: NEGATIVE
Glucose, UA: NEGATIVE mg/dL
Ketones, ur: NEGATIVE mg/dL
Nitrite: NEGATIVE
Protein, ur: 100 mg/dL — AB
Specific Gravity, Urine: 1.008 (ref 1.005–1.030)
Urobilinogen, UA: 0.2 mg/dL (ref 0.0–1.0)
pH: 6 (ref 5.0–8.0)

## 2013-06-09 LAB — CBC WITH DIFFERENTIAL/PLATELET
Basophils Absolute: 0 K/uL (ref 0.0–0.1)
Basophils Relative: 0 % (ref 0–1)
Eosinophils Absolute: 0 10*3/uL (ref 0.0–0.7)
Eosinophils Relative: 0 % (ref 0–5)
HCT: 31.8 % — ABNORMAL LOW (ref 36.0–46.0)
Hemoglobin: 10.6 g/dL — ABNORMAL LOW (ref 12.0–15.0)
Lymphocytes Relative: 11 % — ABNORMAL LOW (ref 12–46)
Lymphs Abs: 1.1 10*3/uL (ref 0.7–4.0)
MCH: 28.8 pg (ref 26.0–34.0)
MCHC: 33.3 g/dL (ref 30.0–36.0)
MCV: 86.4 fL (ref 78.0–100.0)
Monocytes Absolute: 0.8 K/uL (ref 0.1–1.0)
Monocytes Relative: 7 % (ref 3–12)
Neutro Abs: 8.5 K/uL — ABNORMAL HIGH (ref 1.7–7.7)
Neutrophils Relative %: 82 % — ABNORMAL HIGH (ref 43–77)
Platelets: 175 K/uL (ref 150–400)
RBC: 3.68 MIL/uL — ABNORMAL LOW (ref 3.87–5.11)
RDW: 18.9 % — ABNORMAL HIGH (ref 11.5–15.5)
WBC: 10.4 K/uL (ref 4.0–10.5)

## 2013-06-09 LAB — COMPREHENSIVE METABOLIC PANEL WITH GFR
ALT: 13 U/L (ref 0–35)
Albumin: 3.1 g/dL — ABNORMAL LOW (ref 3.5–5.2)
Alkaline Phosphatase: 50 U/L (ref 39–117)
Chloride: 100 meq/L (ref 96–112)
Glucose, Bld: 142 mg/dL — ABNORMAL HIGH (ref 70–99)
Potassium: 3.8 meq/L (ref 3.5–5.1)
Sodium: 133 meq/L — ABNORMAL LOW (ref 135–145)
Total Protein: 7.3 g/dL (ref 6.0–8.3)

## 2013-06-09 LAB — COMPREHENSIVE METABOLIC PANEL
AST: 13 U/L (ref 0–37)
BUN: 9 mg/dL (ref 6–23)
CO2: 21 mEq/L (ref 19–32)
Calcium: 9 mg/dL (ref 8.4–10.5)
Creatinine, Ser: 1.29 mg/dL — ABNORMAL HIGH (ref 0.50–1.10)
GFR calc Af Amer: 57 mL/min — ABNORMAL LOW (ref >90)
GFR calc non Af Amer: 49 mL/min — ABNORMAL LOW (ref >90)
Total Bilirubin: 0.3 mg/dL (ref 0.3–1.2)

## 2013-06-09 LAB — CG4 I-STAT (LACTIC ACID): Lactic Acid, Venous: 1.07 mmol/L (ref 0.5–2.2)

## 2013-06-09 LAB — URINE MICROSCOPIC-ADD ON

## 2013-06-09 IMAGING — CR DG CHEST 2V
2 series · 2 of 2 positions shown · non-contrast
Comparison: [DATE]

CLINICAL DATA: Cough, fever, diarrhea

EXAM:
CHEST  2 VIEW

[w chest pa]
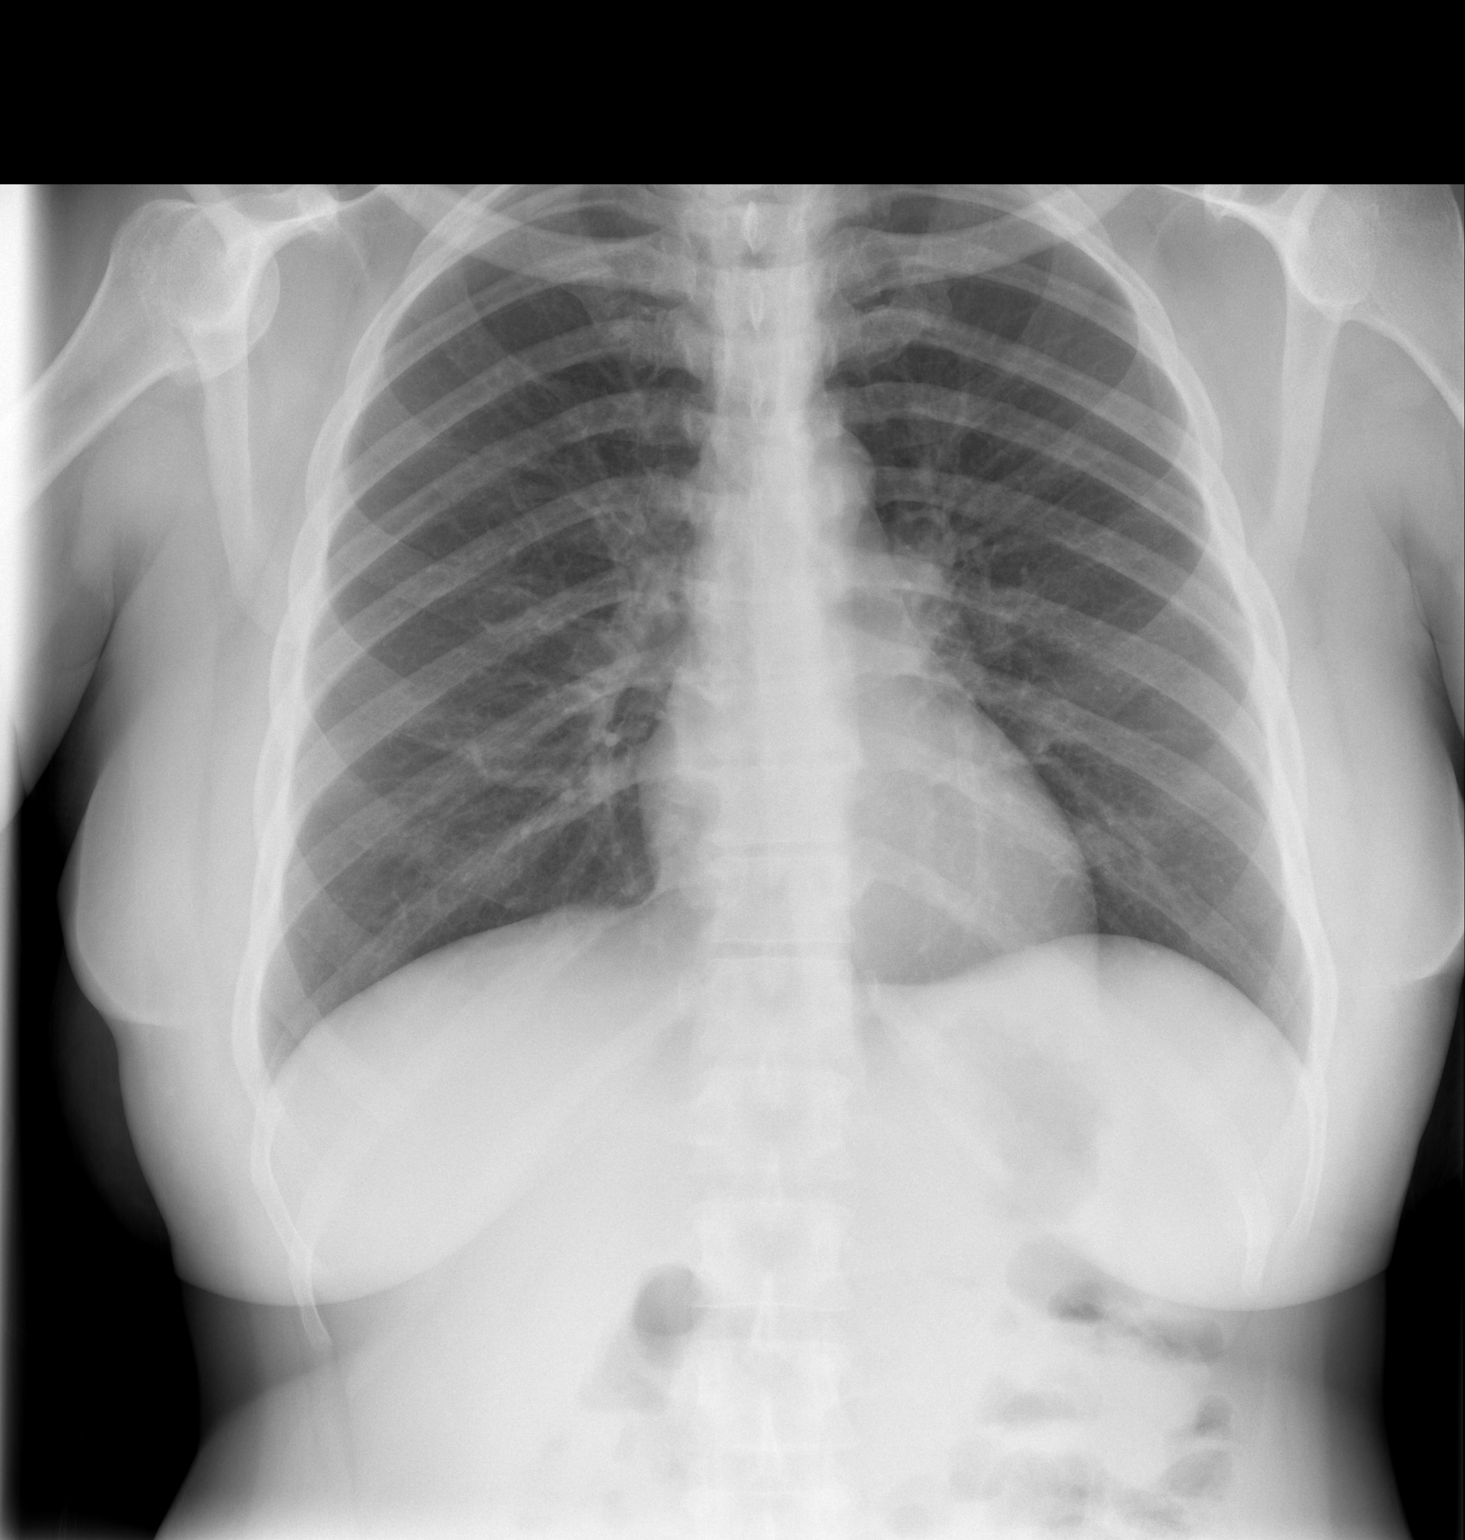

[w chest lat]
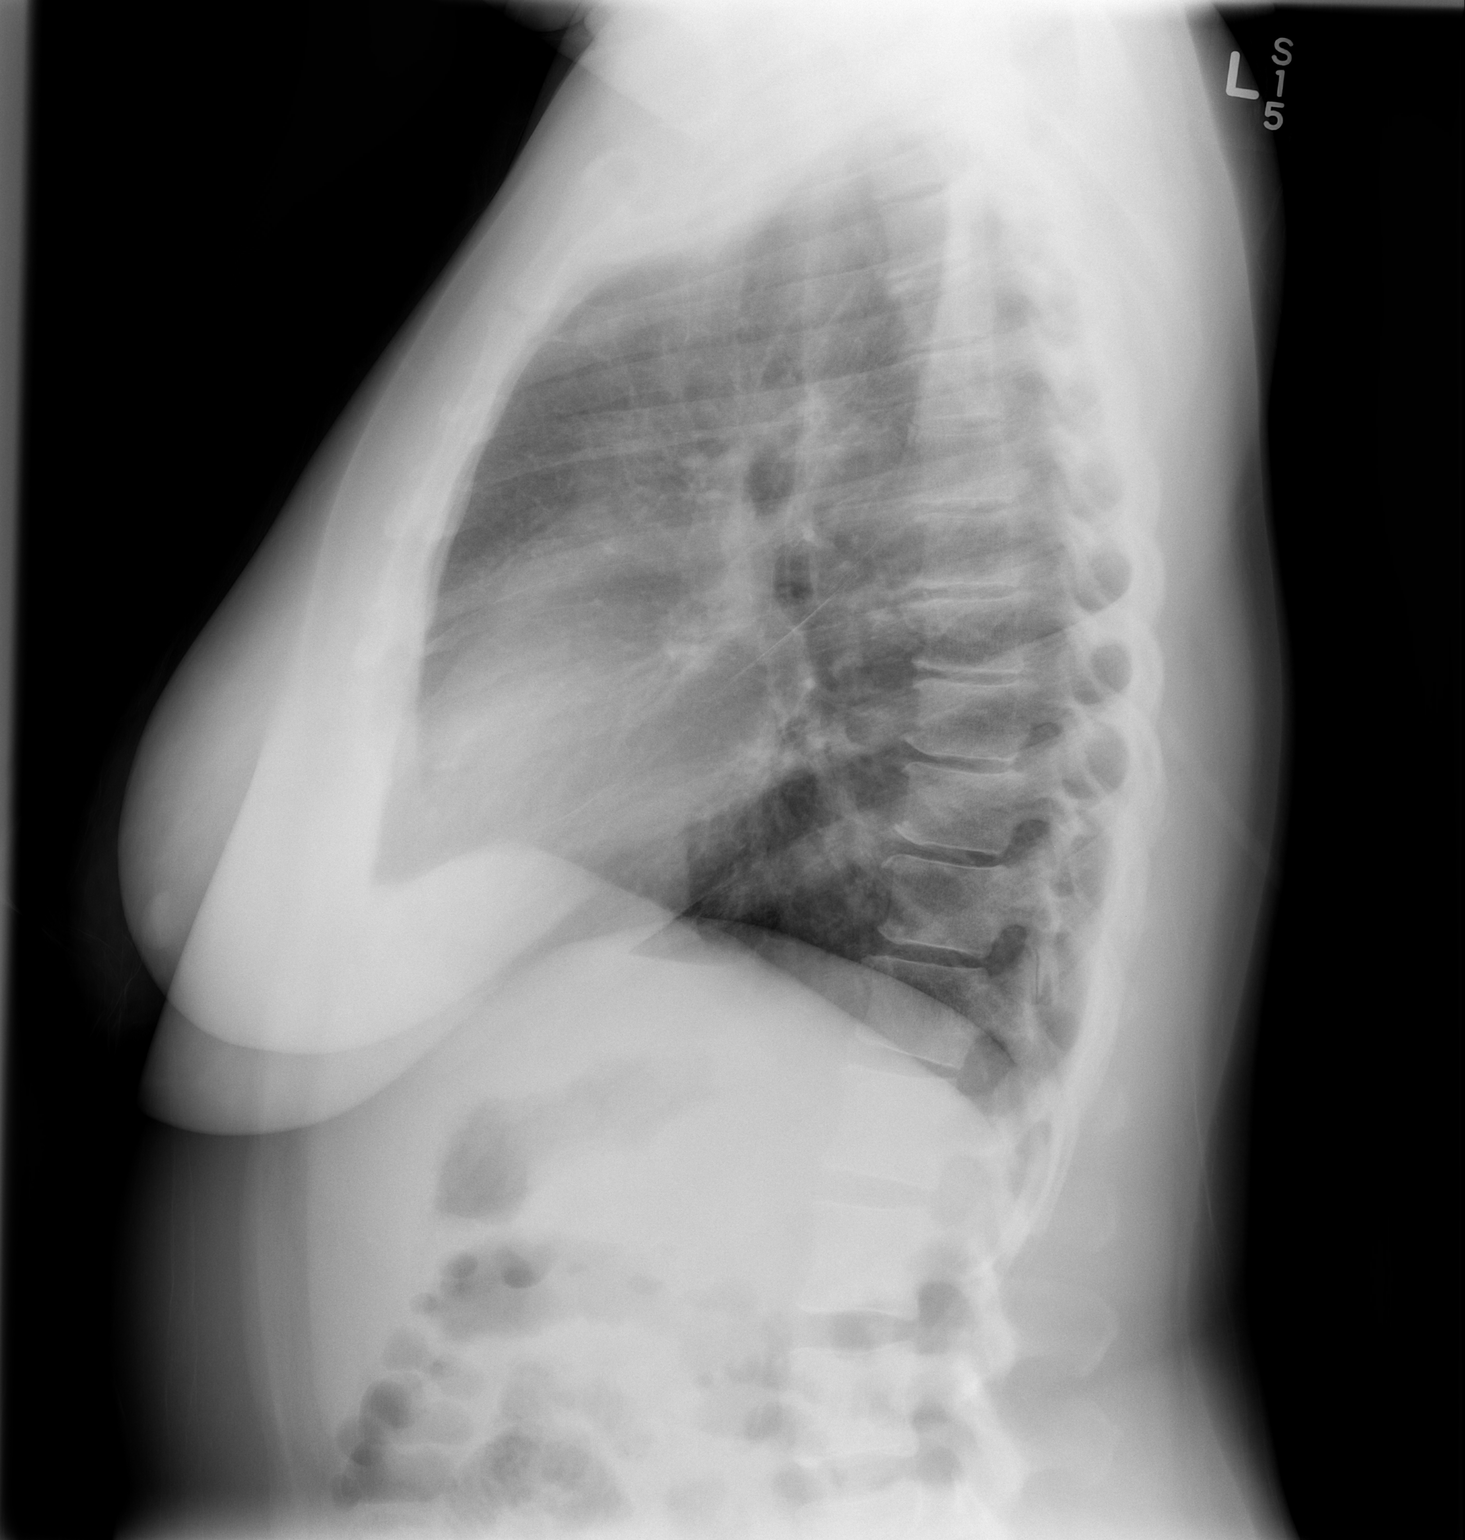

[2 of 2 positions shown; findings below may reference images not displayed]

FINDINGS: Cardiomediastinal silhouette is stable. No acute infiltrate or
pleural effusion. No pulmonary edema. Central mild bronchitic
changes. Stable mild degenerative changes thoracic spine.
IMPRESSION: No acute infiltrate or pulmonary edema. Central mild bronchitic
changes.

## 2013-06-09 MED ORDER — CEPHALEXIN 500 MG PO CAPS: 500.0000 mg | ORAL_CAPSULE | Freq: Four times a day (QID) | ORAL | Status: DC

## 2013-06-09 MED ORDER — ACETAMINOPHEN-CODEINE #3 300-30 MG PO TABS: 1.0000 | ORAL_TABLET | Freq: Four times a day (QID) | ORAL | Status: DC | PRN

## 2013-06-09 MED ORDER — DEXTROSE 5 % IV SOLN
1.0000 g | Freq: Once | INTRAVENOUS | Status: AC
  Administered 2013-06-09: 1 g via INTRAVENOUS
  Filled 2013-06-09: qty 10

## 2013-06-09 MED ORDER — SODIUM CHLORIDE 0.9 % IV SOLN
Freq: Once | INTRAVENOUS | Status: AC
  Administered 2013-06-09: 15:00:00 via INTRAVENOUS

## 2013-06-09 MED ORDER — ACETAMINOPHEN 325 MG PO TABS
650.0000 mg | ORAL_TABLET | Freq: Four times a day (QID) | ORAL | Status: DC | PRN
  Administered 2013-06-09: 650 mg via ORAL
  Filled 2013-06-09: qty 2

## 2013-06-09 MED ORDER — SODIUM CHLORIDE 0.9 % IV BOLUS (SEPSIS)
1000.0000 mL | Freq: Once | INTRAVENOUS | Status: AC
  Administered 2013-06-09: 1000 mL via INTRAVENOUS

## 2013-06-09 MED ORDER — FENTANYL CITRATE 0.05 MG/ML IJ SOLN
50.0000 ug | Freq: Once | INTRAMUSCULAR | Status: AC
  Administered 2013-06-09: 50 ug via INTRAVENOUS
  Filled 2013-06-09: qty 2

## 2013-06-09 NOTE — ED Notes (Signed)
Lab called Lactic acid WNL.

## 2013-06-09 NOTE — ED Notes (Addendum)
Pt in c/o fever and diarrhea since yesterday, states she took ASA this am to try to reduce fever, no tylenol prior to arrival, pt alert and oriented, denies vomiting, tolerating fluids at this time, pt is s/p kidney transplant

## 2013-06-09 NOTE — ED Provider Notes (Signed)
CSN: [id number]     Arrival date & time 06/09/13  1103 History   First MD Initiated Contact with Patient 06/09/13 1116     Chief Complaint  Patient presents with  . Fever  . Diarrhea   (Consider location/radiation/quality/duration/timing/severity/associated sxs/prior Treatment) Patient is a 47 y.o. female presenting with fever and diarrhea. The history is provided by the patient and medical records.  Fever Severity:  Moderate Onset quality:  Gradual Duration:  4 days Timing:  Constant Progression:  Worsening Chronicity:  New Relieved by:  Aspirin Associated symptoms: chills, cough, diarrhea and myalgias   Associated symptoms: no headaches, no nausea, no rash and no vomiting   Cough:    Cough characteristics:  Non-productive   Severity:  Mild   Duration:  2 days   Timing:  Intermittent   Progression:  Unchanged Diarrhea:    Quality:  Watery   Number of occurrences:  5 per day   Severity:  Moderate   Duration:  4 days Risk factors: immunosuppression   Risk factors: no recent travel and no sick contacts   Diarrhea Associated symptoms: chills, fever and myalgias   Associated symptoms: no abdominal pain, no headaches and no vomiting     Past Medical History  Diagnosis Date  . Deceased-donor kidney transplant     Performed at [facility], April 2010.  Initial ESRD due to HTN nephropathy  . Anemia of chronic disease   . ESRD (end stage renal disease)     s/p transplant creatinine baseline 1.1  . History of hyperparathyroidism   . Hypertension   . Shortness of breath   . GERD (gastroesophageal reflux disease)   . Headache(784.0)   . Arthritis    Past Surgical History  Procedure Laterality Date  . Kidney transplant  11/2008    [facility])  . Av fistula placement    . Fracture surgery      left foot,baby toe nad next toe missing   Family History  Problem Relation Age of Onset  . Diabetes Brother   . Hypertension Mother   . Hypertension Sister   .  Hypertension Father   . Kidney disease Brother     on HD   History  Substance Use Topics  . Smoking status: Current Every Day Smoker -- 0.50 packs/day for 27 years    Types: Cigarettes  . Smokeless tobacco: Never Used     Comment: 10 cigarettes a day  . Alcohol Use: No     Comment: occasional drinker noted in the past   OB History   Grav Para Term Preterm Abortions TAB SAB Ect Mult Living                 Review of Systems  Constitutional: Positive for fever, chills, appetite change and fatigue.  Respiratory: Positive for cough.   Gastrointestinal: Positive for diarrhea. Negative for nausea, vomiting and abdominal pain.  Musculoskeletal: Positive for myalgias. Negative for neck pain and neck stiffness.  Skin: Negative for rash.  Neurological: Negative for syncope and headaches.  All other systems reviewed and are negative.    Allergies  Penicillins  Home Medications   Current Outpatient Rx  Name  Route  Sig  Dispense  Refill  . amLODipine (NORVASC) 10 MG tablet   Oral   Take 10 mg by mouth daily.          Marland Kitchen aspirin 81 MG tablet   Oral   Take 81 mg by mouth daily.          Marland Kitchen  atenolol (TENORMIN) 50 MG tablet   Oral   Take 50 mg by mouth 2 (two) times daily.          . diphenhydrAMINE (BENADRYL) 25 MG tablet   Oral   Take 25 mg by mouth every 8 (eight) hours as needed (for cough).         . docusate sodium (COLACE) 100 MG capsule   Oral   Take 100 mg by mouth 2 (two) times daily as needed for constipation.          . ferrous sulfate 325 (65 FE) MG tablet   Oral   Take 650 mg by mouth 2 (two) times daily with a meal.          . furosemide (LASIX) 40 MG tablet   Oral   Take 40 mg by mouth 2 (two) times daily.          Marland Kitchen KLOR-CON M20 20 MEQ tablet   Oral   Take 20 mEq by mouth 2 (two) times daily.          . mycophenolate (CELLCEPT) 250 MG capsule   Oral   Take 1,000 mg by mouth 2 (two) times daily. Take 4 capsules by mouth twice daily.          . tacrolimus (PROGRAF) 1 MG capsule   Oral   Take 9-10 mg by mouth 2 (two) times daily. Take 10 capsules in the morning and takes 9 capsules at night         . acetaminophen-codeine (TYLENOL #3) 300-30 MG per tablet   Oral   Take 1-2 tablets by mouth every 6 (six) hours as needed for moderate pain.   15 tablet   0   . cephALEXin (KEFLEX) 500 MG capsule   Oral   Take 1 capsule (500 mg total) by mouth 4 (four) times daily.   20 capsule   0    BP 112/74  Pulse 86  Temp(Src) 99.8 F (37.7 C) (Oral)  Resp 20  Wt 183 lb (83.008 kg)  SpO2 100%  LMP 06/03/2013 Physical Exam  Nursing note and vitals reviewed. Constitutional: She is oriented to person, place, and time. She appears well-developed and well-nourished. She is cooperative.  Non-toxic appearance. She appears ill. No distress.  HENT:  Mouth/Throat: Uvula is midline, oropharynx is clear and moist and mucous membranes are normal.  Neck: Neck supple. No Brudzinski's sign and no Kernig's sign noted.  Pulmonary/Chest: Effort normal. No stridor. No respiratory distress. She has no wheezes. She has no rales.  Dry coughing  Abdominal: Soft. Normal appearance and bowel sounds are normal. She exhibits no distension. There is no tenderness. There is no rebound, no guarding and no CVA tenderness.  Neurological: She is alert and oriented to person, place, and time.  Skin: Skin is warm. No rash noted. She is not diaphoretic. No pallor.  Psychiatric: She has a normal mood and affect.    ED Course  Procedures (including critical care time) Labs Review Labs Reviewed  CBC WITH DIFFERENTIAL - Abnormal; Notable for the following:    RBC 3.68 (*)    Hemoglobin 10.6 (*)    HCT 31.8 (*)    RDW 18.9 (*)    Neutrophils Relative % 82 (*)    Neutro Abs 8.5 (*)    Lymphocytes Relative 11 (*)    All other components within normal limits  URINALYSIS, ROUTINE W REFLEX MICROSCOPIC - Abnormal; Notable for the following:    APPearance  CLOUDY (*)    Hgb urine dipstick LARGE (*)    Protein, ur 100 (*)    Leukocytes, UA LARGE (*)    All other components within normal limits  COMPREHENSIVE METABOLIC PANEL - Abnormal; Notable for the following:    Sodium 133 (*)    Glucose, Bld 142 (*)    Creatinine, Ser 1.29 (*)    Albumin 3.1 (*)    GFR calc non Af Amer 49 (*)    GFR calc Af Amer 57 (*)    All other components within normal limits  URINE MICROSCOPIC-ADD ON - Abnormal; Notable for the following:    Squamous Epithelial / LPF FEW (*)    Bacteria, UA MANY (*)    All other components within normal limits  CULTURE, BLOOD (ROUTINE X 2)  CULTURE, BLOOD (ROUTINE X 2)  URINE CULTURE  MYCOPHENOLIC ACID (CELLCEPT)  CG4 I-STAT (LACTIC ACID)   Imaging Review Dg Chest 2 View  06/09/2013   CLINICAL DATA:  Cough, fever, diarrhea  EXAM: CHEST  2 VIEW  COMPARISON:  04/05/2013  FINDINGS: Cardiomediastinal silhouette is stable. No acute infiltrate or pleural effusion. No pulmonary edema. Central mild bronchitic changes. Stable mild degenerative changes thoracic spine.  IMPRESSION: No acute infiltrate or pulmonary edema. Central mild bronchitic changes.   Electronically Signed   By: Lahoma Crocker M.D.   On: 06/09/2013 12:18    EKG Interpretation   None      ra sat is 100% and I interpret to be normal   2:15 PM Overall, pt feels somewhat improved.  Still has a mild HA by her report.  UTI, will start on IV abx and discuss with renal regarding disposition.     Discussed with Dr. Marval Regal who agreed pt seemed stable to be discharged to home, treatment of UTI and close follow up.  He would let his office know and thus follow up with Dr. Jimmy Footman could be arranged as an outpt.    MDM   1. Complicated UTI (urinary tract infection)   2. Diarrhea   3. History of renal transplant     Pt with immunocompromise due to cellcept and prograf for renal transplant.  Pt with dry cough, mild, diarrhea for 4 days.  Tachycardic, febrile, meets  SIRS criteria.  Pt did receive flu immunization a few weeks ago.  Will give IVF's, antipyretics, cultures, monitor and discuss with renal.  Likely admission.        Saddie Benders. Pernell Dikes, MD 06/10/13 1046

## 2013-06-09 NOTE — ED Notes (Signed)
Patient transported to X-ray 

## 2013-06-09 NOTE — Discharge Instructions (Signed)
Urinary Tract Infection  Urinary tract infections (UTIs) can develop anywhere along your urinary tract. Your urinary tract is your body's drainage system for removing wastes and extra water. Your urinary tract includes two kidneys, two ureters, a bladder, and a urethra. Your kidneys are a pair of bean-shaped organs. Each kidney is about the size of your fist. They are located below your ribs, one on each side of your spine.  CAUSES  Infections are caused by microbes, which are microscopic organisms, including fungi, viruses, and bacteria. These organisms are so small that they can only be seen through a microscope. Bacteria are the microbes that most commonly cause UTIs.  SYMPTOMS   Symptoms of UTIs may vary by age and gender of the patient and by the location of the infection. Symptoms in young women typically include a frequent and intense urge to urinate and a painful, burning feeling in the bladder or urethra during urination. Older women and men are more likely to be tired, shaky, and weak and have muscle aches and abdominal pain. A fever may mean the infection is in your kidneys. Other symptoms of a kidney infection include pain in your back or sides below the ribs, nausea, and vomiting.  DIAGNOSIS  To diagnose a UTI, your caregiver will ask you about your symptoms. Your caregiver also will ask to provide a urine sample. The urine sample will be tested for bacteria and white blood cells. White blood cells are made by your body to help fight infection.  TREATMENT   Typically, UTIs can be treated with medication. Because most UTIs are caused by a bacterial infection, they usually can be treated with the use of antibiotics. The choice of antibiotic and length of treatment depend on your symptoms and the type of bacteria causing your infection.  HOME CARE INSTRUCTIONS   If you were prescribed antibiotics, take them exactly as your caregiver instructs you. Finish the medication even if you feel better after you  have only taken some of the medication.   Drink enough water and fluids to keep your urine clear or pale yellow.   Avoid caffeine, tea, and carbonated beverages. They tend to irritate your bladder.   Empty your bladder often. Avoid holding urine for long periods of time.   Empty your bladder before and after sexual intercourse.   After a bowel movement, women should cleanse from front to back. Use each tissue only once.  SEEK MEDICAL CARE IF:    You have back pain.   You develop a fever.   Your symptoms do not begin to resolve within 3 days.  SEEK IMMEDIATE MEDICAL CARE IF:    You have severe back pain or lower abdominal pain.   You develop chills.   You have nausea or vomiting.   You have continued burning or discomfort with urination.  MAKE SURE YOU:    Understand these instructions.   Will watch your condition.   Will get help right away if you are not doing well or get worse.  Document Released: 04/30/2005 Document Revised: 01/20/2012 Document Reviewed: 08/29/2011  Exit[employer] Patient Information 2014 [employer].

## 2013-06-09 NOTE — ED Notes (Signed)
Results of lactic acid called to nurse Luellen Pucker in [place]

## 2013-06-12 LAB — URINE CULTURE: Colony Count: >=100000

## 2013-06-12 LAB — MYCOPHENOLIC ACID (CELLCEPT)
MPA Glucuronide: 11 ug/mL — ABNORMAL LOW (ref 35.0–100.0)
MPA: <0.5 ug/mL — CL (ref 1.0–3.5)

## 2013-06-15 ENCOUNTER — Telehealth (HOSPITAL_COMMUNITY): Payer: Self-pay | Admitting: Emergency Medicine

## 2013-06-15 LAB — CULTURE, BLOOD (ROUTINE X 2)
Culture: NO GROWTH
Culture: NO GROWTH

## 2013-06-15 NOTE — ED Notes (Signed)
Post ED Visit - Positive Culture Follow-up  Culture report reviewed by antimicrobial stewardship pharmacist: []  Sundra Aland, Pharm.D., BCPS [x]  Heide Guile, Pharm.D., BCPS []  Alycia Rossetti, Pharm.D., BCPS []  [place], Pharm.D., BCPS, AAHIVP []  Legrand Como, Pharm.D., BCPS, AAHIVP  Positive urine culture Treated with Keflex, organism sensitive to the same and no further patient follow-up is required at this time.  Ruth Gutierrez 06/15/2013, 8:07 AM

## 2013-10-17 ENCOUNTER — Other Ambulatory Visit (HOSPITAL_COMMUNITY): Payer: Self-pay | Admitting: *Deleted

## 2013-10-18 ENCOUNTER — Encounter (HOSPITAL_COMMUNITY)
Admission: RE | Admit: 2013-10-18 | Discharge: 2013-10-18 | Disposition: A | Discharge status: Home / Self Care | Payer: BC Managed Care – PPO | Source: Ambulatory Visit | Attending: Nephrology | Admitting: Nephrology

## 2013-10-18 DIAGNOSIS — N039 Chronic nephritic syndrome with unspecified morphologic changes: Principal | ICD-10-CM

## 2013-10-18 DIAGNOSIS — N186 End stage renal disease: Secondary | ICD-10-CM | POA: Insufficient documentation

## 2013-10-18 DIAGNOSIS — D631 Anemia in chronic kidney disease: Secondary | ICD-10-CM | POA: Insufficient documentation

## 2013-10-18 DIAGNOSIS — Z940 Kidney transplant status: Secondary | ICD-10-CM | POA: Insufficient documentation

## 2013-10-18 MED ORDER — SODIUM CHLORIDE 0.9 % IV SOLN
1020.0000 mg | Freq: Once | INTRAVENOUS | Status: AC
  Administered 2013-10-18: 10:00:00 1020 mg via INTRAVENOUS
  Filled 2013-10-18: qty 34

## 2013-10-18 MED ORDER — SODIUM CHLORIDE 0.9 % IV SOLN: INTRAVENOUS | Status: DC

## 2013-11-03 ENCOUNTER — Other Ambulatory Visit: Payer: Self-pay | Admitting: Obstetrics and Gynecology

## 2013-11-03 DIAGNOSIS — E041 Nontoxic single thyroid nodule: Secondary | ICD-10-CM

## 2013-11-08 ENCOUNTER — Ambulatory Visit
Admission: RE | Admit: 2013-11-08 | Discharge: 2013-11-08 | Disposition: A | Discharge status: Home / Self Care | Payer: BC Managed Care – PPO | Source: Ambulatory Visit | Attending: Obstetrics and Gynecology | Admitting: Obstetrics and Gynecology

## 2013-11-08 DIAGNOSIS — E041 Nontoxic single thyroid nodule: Secondary | ICD-10-CM

## 2013-11-08 IMAGING — US US SOFT TISSUE HEAD/NECK
1 series · 14 of 25 positions shown · non-contrast
Comparison: None.

CLINICAL DATA: Thyroid nodule on physical exam

EXAM:
THYROID ULTRASOUND
TECHNIQUE: Ultrasound examination of the thyroid gland and adjacent soft
tissues was performed.

[Series 1: us soft tissue head/neck · 0.08mm/px · 14 of 46 slices shown]
[im 1/46]
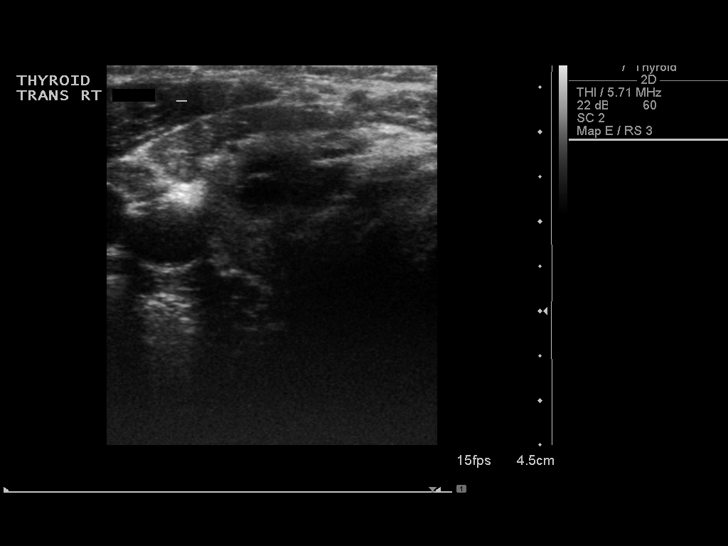
[im 4/46]
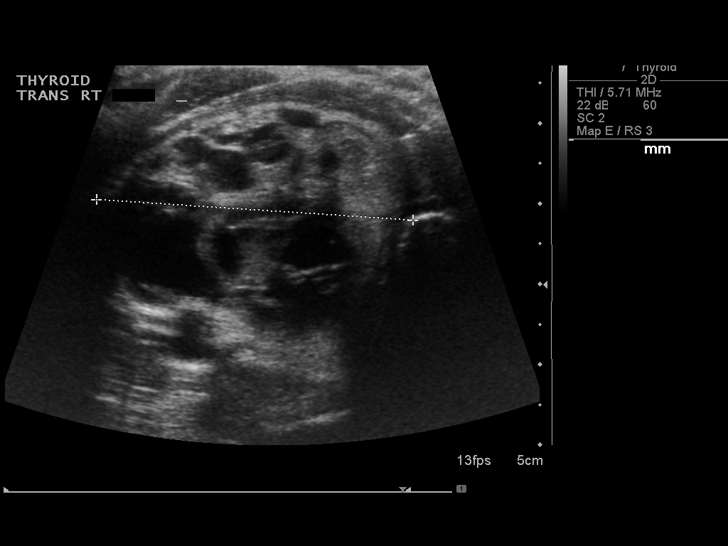
[im 8/46]
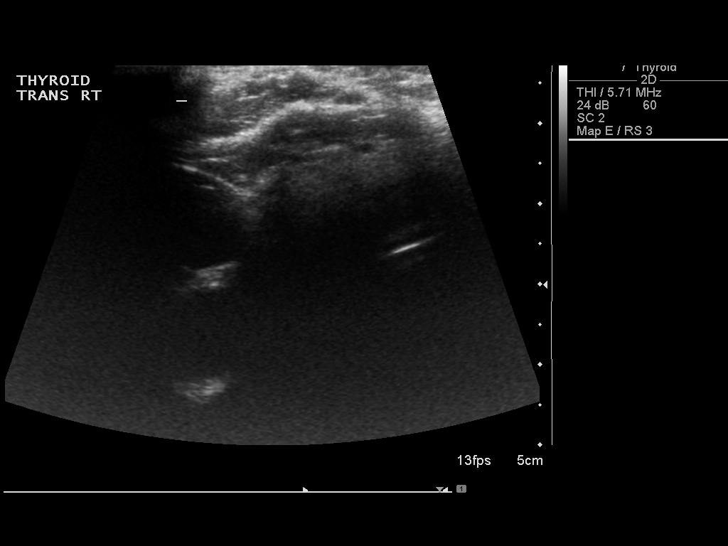
[im 12/46]
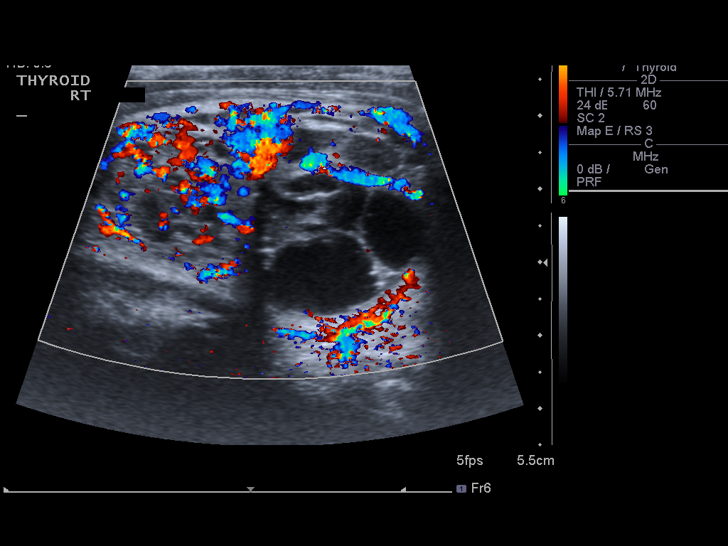
[im 16/46]
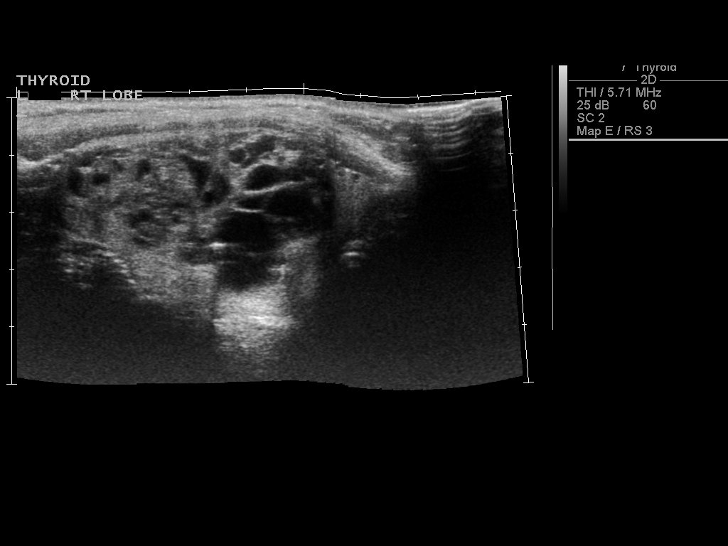
[im 17/46]
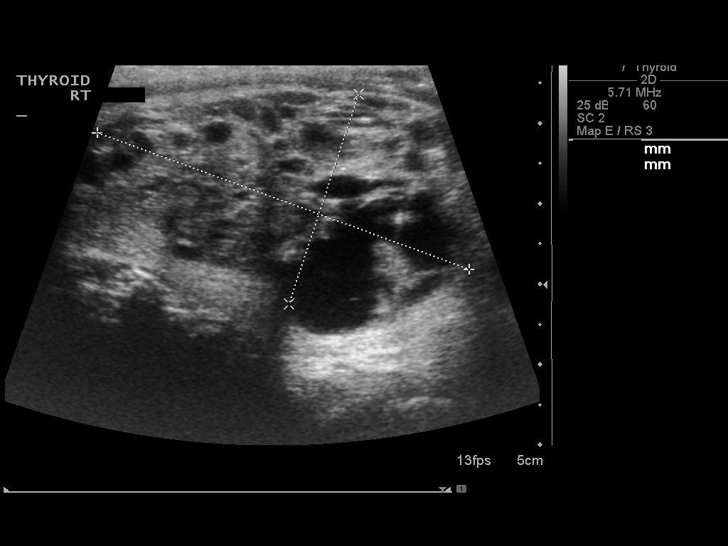
[im 21/46]
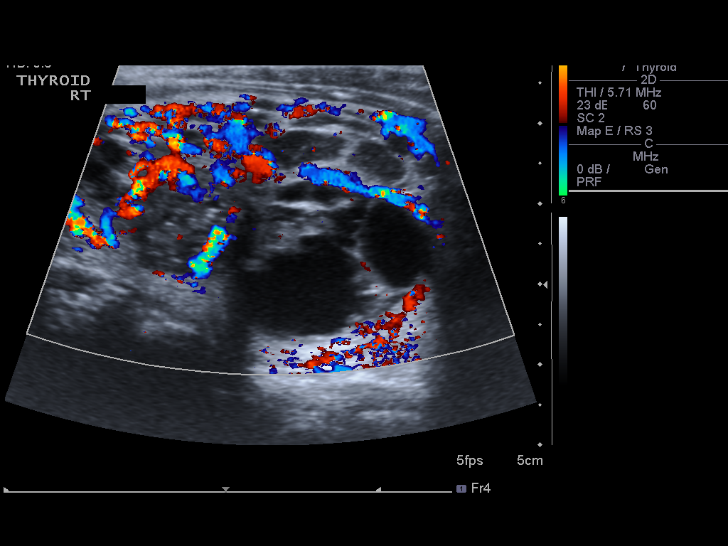
[im 25/46]
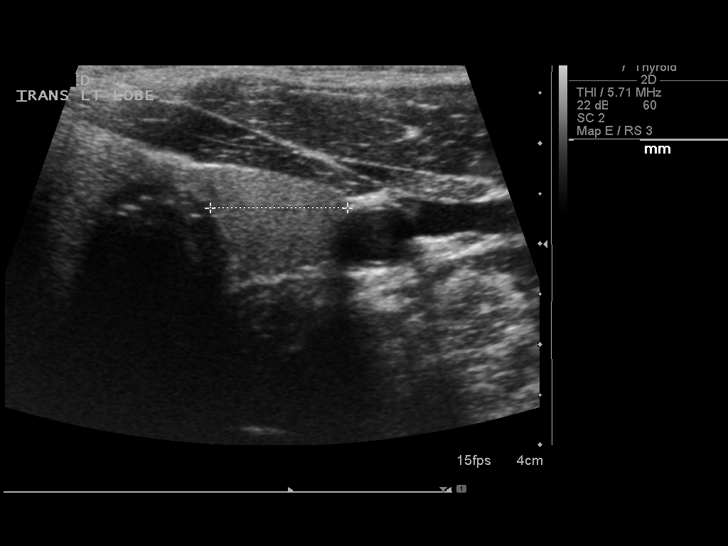
[im 29/46]
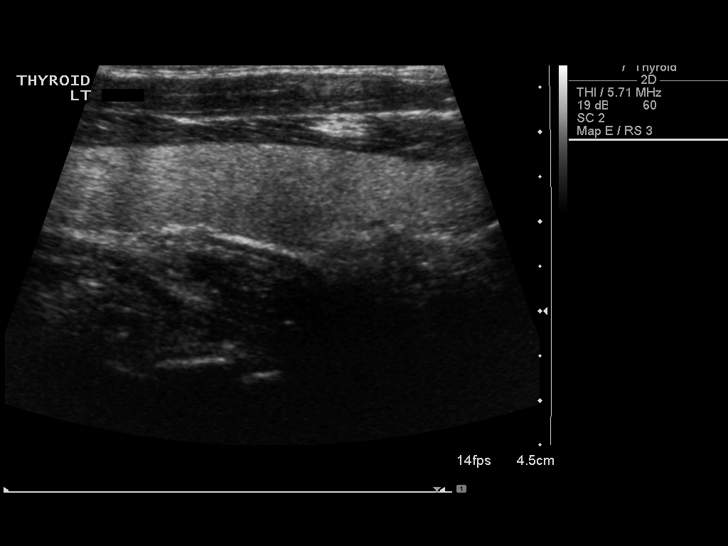
[im 31/46]
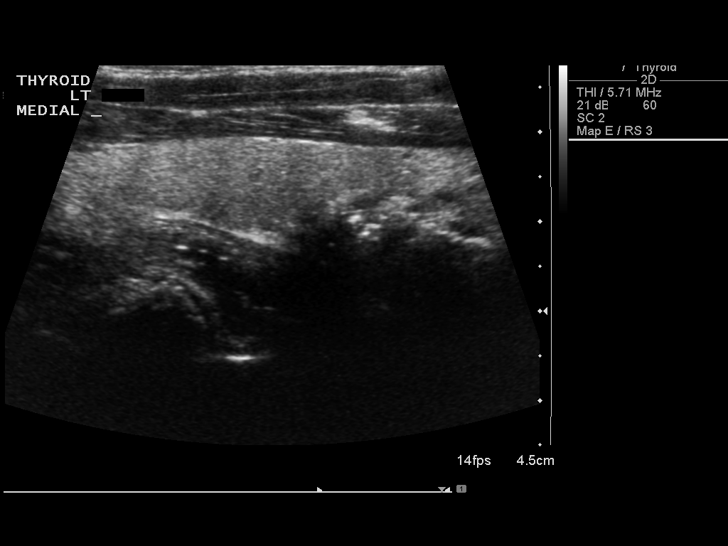
[im 34/46]
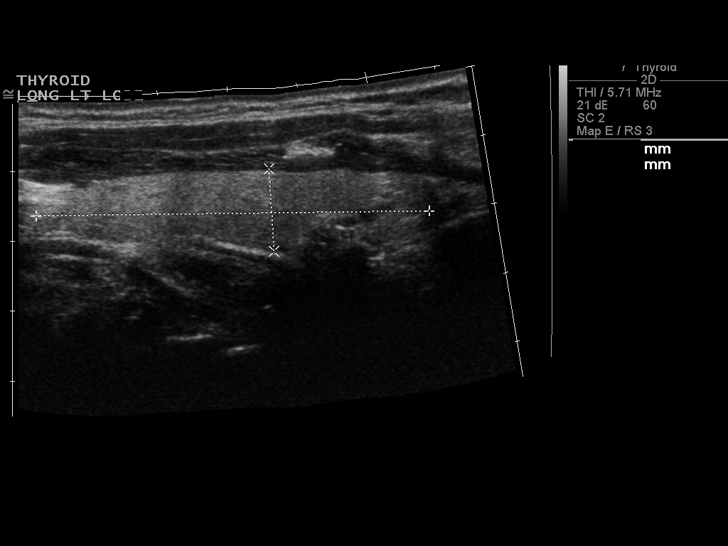
[im 38/46]
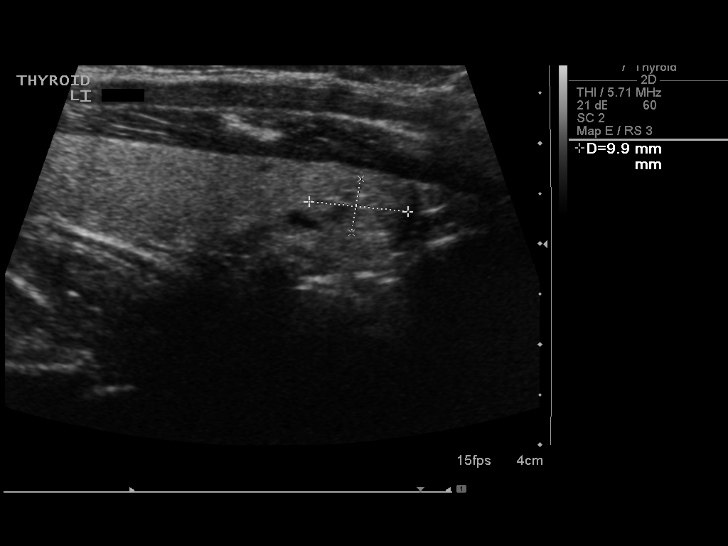
[im 42/46]
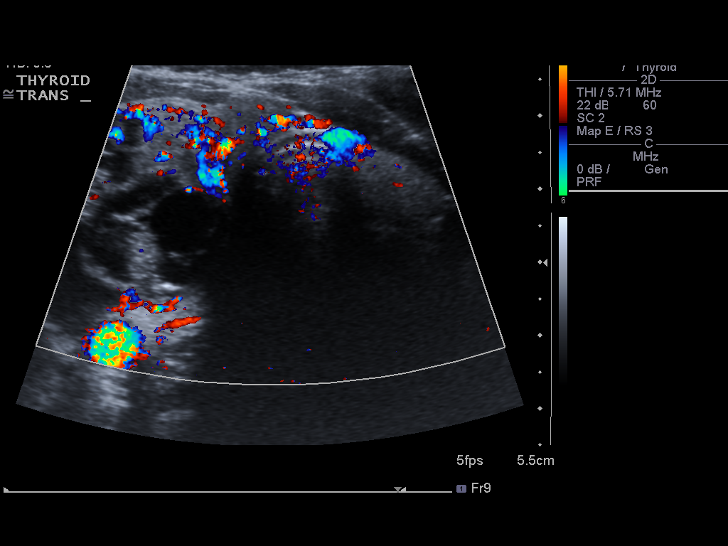
[im 46/46]
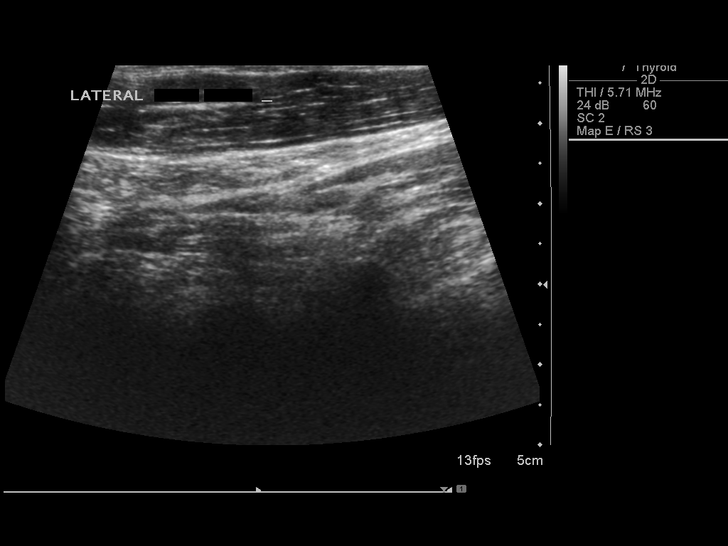

[14 of 25 positions shown; findings below may reference images not displayed]

FINDINGS: Right thyroid lobe

Measurements: 6.5 x 3.5 x 3.9 cm.. Much of the right lobe of thyroid
is occupied by a complex primarily solid nodule with cystic
components measuring 4.9 x 2.8 x 4.3 cm. This nodule does meet
criteria for percutaneous biopsy.

Left thyroid lobe

Measurements: 5.6 x 1.2 x 1.4 cm.. Only a small isoechoic nodule is
present in the lower lobe inferiorly measuring 1.0 x 0.5 x 0.8 cm.

Isthmus

Thickness: 7 mm in thickness..  No nodules visualized.

Lymphadenopathy

None visualized.
IMPRESSION: There is a dominant complex primarily solid nodule occupying much of
the right lobe of thyroid as described above. Findings meet
consensus criteria for biopsy. Ultrasound-guided fine needle
aspiration should be considered, as per the consensus statement:
Management of Thyroid Nodules Detected at US: Society of
Radiologists in Ultrasound Consensus Conference Statement. Radiology

## 2013-11-11 ENCOUNTER — Ambulatory Visit: Payer: BC Managed Care – PPO | Admitting: Endocrinology

## 2013-11-16 ENCOUNTER — Ambulatory Visit (INDEPENDENT_AMBULATORY_CARE_PROVIDER_SITE_OTHER): Payer: BC Managed Care – PPO | Admitting: Endocrinology

## 2013-11-16 ENCOUNTER — Encounter: Payer: Self-pay | Admitting: Endocrinology

## 2013-11-16 VITALS — BP 122/74 | HR 84 | Temp 97.8°F | Resp 14 | Ht 67.0 in | Wt 177.2 lb

## 2013-11-16 DIAGNOSIS — R7989 Other specified abnormal findings of blood chemistry: Secondary | ICD-10-CM

## 2013-11-16 DIAGNOSIS — E049 Nontoxic goiter, unspecified: Secondary | ICD-10-CM

## 2013-11-16 DIAGNOSIS — R946 Abnormal results of thyroid function studies: Secondary | ICD-10-CM

## 2013-11-16 LAB — T4, FREE: Free T4: 0.9 ng/dL (ref 0.60–1.60)

## 2013-11-16 LAB — T3, FREE: T3, Free: 3.5 pg/mL (ref 2.3–4.2)

## 2013-11-16 NOTE — Progress Notes (Signed)
Patient ID: Ruth Gutierrez, female   DOB: 1966/02/05, 48 y.o.   MRN: [id number]     Reason for Appointment: Goiter, new consultation    History of Present Illness:   The patient thinks that she has had a swelling in the right side of her neck for a few years but has not had this evaluated until recently No details are available about previous evaluation but she has had abnormal TSH levels and 2013 and 2014 although normal in 2009 Her gynecologist has referred her after evaluating her for a right-sided thyroid enlargement She also had a TSH done with the gynecologist but no free T4 or T3 She has had no difficulty with swallowing  Does not feel like she has any choking sensation in her neck or pressure in any position or when lying down.  Labs: 11/03/13 her TSH was 0.036  Lab Results  Component Value Date   TSH 0.009* 02/03/2013   Patient does not have any history of palpitations, nervousness, heat intolerance but has had mild progressive weight loss. She has normal appetite   Wt Readings from Last 3 Encounters:  11/16/13 177 lb 3.2 oz (80.377 kg)  10/18/13 180 lb (81.647 kg)  06/09/13 183 lb (83.008 kg)    She has had ultrasound exam in 4/15 which showed a complex primarily solid nodule with cystic  components measuring 4.9 x 2.8 x 4.3 cm.     Medication List       This list is accurate as of: 11/16/13  2:39 PM.  Always use your most recent med list.               acetaminophen-codeine 300-30 MG per tablet  Commonly known as:  TYLENOL #3  Take 1-2 tablets by mouth every 6 (six) hours as needed for moderate pain.     amLODipine 10 MG tablet  Commonly known as:  NORVASC  Take 10 mg by mouth daily.     aspirin 81 MG tablet  Take 81 mg by mouth daily.     atenolol 50 MG tablet  Commonly known as:  TENORMIN  Take 50 mg by mouth 2 (two) times daily.     cephALEXin 500 MG capsule  Commonly known as:  KEFLEX  Take 1 capsule (500 mg total) by mouth 4 (four) times  daily.     cephALEXin 250 MG capsule  Commonly known as:  KEFLEX  Take 250 mg by mouth daily.     diphenhydrAMINE 25 MG tablet  Commonly known as:  BENADRYL  Take 25 mg by mouth every 8 (eight) hours as needed (for cough).     docusate sodium 100 MG capsule  Commonly known as:  COLACE  Take 100 mg by mouth 2 (two) times daily as needed for constipation.     ferrous sulfate 325 (65 FE) MG tablet  Take 650 mg by mouth 2 (two) times daily with a meal.     furosemide 40 MG tablet  Commonly known as:  LASIX  Take 40 mg by mouth 2 (two) times daily.     KLOR-CON M20 20 MEQ tablet  Generic drug:  potassium chloride SA  Take 20 mEq by mouth 2 (two) times daily.     mycophenolate 250 MG capsule  Commonly known as:  CELLCEPT  Take 1,000 mg by mouth 2 (two) times daily. Take 4 capsules by mouth twice daily.     tacrolimus 1 MG capsule  Commonly known as:  PROGRAF  Take 9-10 mg  by mouth 2 (two) times daily. Take 10 capsules in the morning and takes 9 capsules at night        Allergies:  Allergies  Allergen Reactions  . Penicillins Other (See Comments)    Itchiness    Past Medical History  Diagnosis Date  . Deceased-donor kidney transplant     Performed at [facility], April 2010.  Initial ESRD due to HTN nephropathy  . Anemia of chronic disease   . ESRD (end stage renal disease)     s/p transplant creatinine baseline 1.1  . History of hyperparathyroidism   . Hypertension   . Shortness of breath   . GERD (gastroesophageal reflux disease)   . Headache(784.0)   . Arthritis     Past Surgical History  Procedure Laterality Date  . Kidney transplant  11/2008    [facility])  . Av fistula placement    . Fracture surgery      left foot,baby toe nad next toe missing    Family History  Problem Relation Age of Onset  . Diabetes Brother   . Hypertension Mother   . Hypertension Sister   . Hypertension Father   . Kidney disease Brother     on HD    Social  History:  reports that she has been smoking Cigarettes.  She has a 13.5 pack-year smoking history. She has never used smokeless tobacco. She reports that she does not drink alcohol or use illicit drugs.   Review of Systems:  There is a  history of high blood pressure.    She has had a renal transplant for end-stage kidney disease followed by nephrologist           No  history of Diabetes.     Has history of iron deficiency anemia         Examination:   BP 122/74  Pulse 84  Temp(Src) 97.8 F (36.6 C)  Resp 14  Ht 5\' 7"  (1.702 m)  Wt 177 lb 3.2 oz (80.377 kg)  BMI 27.75 kg/m2  SpO2 98%   General Appearance: pleasant, averagely built and nourished         Eyes: No  prominence or eyelid swelling.          Neck: The thyroid is palpable on the right side, the entire lobe measures about 5.5-6 cm, it is smooth and slightly firm without nodularity. Left side is nonpalpable There is no stridor. Pemberton sign is negative There is no lymphadenopathy .    Cardiovascular: Normal  heart sounds, no murmur Respiratory:  Lungs clear Neurological: REFLEXES: at biceps are normal.  Skin: no rash        Assessment/Plan:  Large right-sided thyroid nodule which appears to have been present for several years according to patient On exam does not appear to have any signs of hyperthyroidism She does have a history of low TSH and weight loss and may have subclinical hyperthyroidism Will check her free T4 and T3 levels and probably needs a nuclear thyroid scan before considering needle biopsy Explained to the patient the nature of thyroid nodules, possibility of a toxic nodule and if the nodule is cold may consider needle aspiration for evaluation   Elayne Snare 11/16/2013

## 2013-11-17 NOTE — Progress Notes (Signed)
Quick Note:  Does not have overactive thyroid, will schedule nuclear thyroid scan to look at the nodule function and will decide on treatment after this ______

## 2013-11-21 ENCOUNTER — Telehealth: Payer: Self-pay | Admitting: *Deleted

## 2013-11-21 ENCOUNTER — Other Ambulatory Visit: Payer: Self-pay | Admitting: Endocrinology

## 2013-11-21 NOTE — Telephone Encounter (Signed)
Results given to patient

## 2013-12-01 ENCOUNTER — Ambulatory Visit (HOSPITAL_COMMUNITY): Payer: BC Managed Care – PPO | Attending: Endocrinology

## 2014-01-27 ENCOUNTER — Other Ambulatory Visit (HOSPITAL_COMMUNITY): Payer: Self-pay | Admitting: *Deleted

## 2014-01-30 ENCOUNTER — Encounter (HOSPITAL_COMMUNITY)
Admission: RE | Admit: 2014-01-30 | Discharge: 2014-01-30 | Disposition: A | Discharge status: Home / Self Care | Payer: BC Managed Care – PPO | Source: Ambulatory Visit | Attending: Nephrology | Admitting: Nephrology

## 2014-01-30 DIAGNOSIS — Z940 Kidney transplant status: Secondary | ICD-10-CM | POA: Insufficient documentation

## 2014-01-30 DIAGNOSIS — D631 Anemia in chronic kidney disease: Secondary | ICD-10-CM | POA: Insufficient documentation

## 2014-01-30 DIAGNOSIS — N039 Chronic nephritic syndrome with unspecified morphologic changes: Principal | ICD-10-CM

## 2014-01-30 MED ORDER — SODIUM CHLORIDE 0.9 % IV SOLN
1020.0000 mg | Freq: Once | INTRAVENOUS | Status: AC
  Administered 2014-01-30: 10:00:00 1020 mg via INTRAVENOUS
  Filled 2014-01-30: qty 34

## 2014-04-18 DIAGNOSIS — Z9114 Patient's other noncompliance with medication regimen: Secondary | ICD-10-CM | POA: Insufficient documentation

## 2014-04-18 DIAGNOSIS — D849 Immunodeficiency, unspecified: Secondary | ICD-10-CM | POA: Insufficient documentation

## 2014-09-07 ENCOUNTER — Emergency Department (HOSPITAL_COMMUNITY)
Admission: EM | Admit: 2014-09-07 | Discharge: 2014-09-07 | Disposition: A | Discharge status: Other Acute Inpatient Hospital | Payer: BLUE CROSS/BLUE SHIELD | Attending: Emergency Medicine | Admitting: Emergency Medicine

## 2014-09-07 ENCOUNTER — Encounter (HOSPITAL_COMMUNITY): Payer: Self-pay | Admitting: Emergency Medicine

## 2014-09-07 ENCOUNTER — Emergency Department (HOSPITAL_COMMUNITY): Payer: BLUE CROSS/BLUE SHIELD

## 2014-09-07 DIAGNOSIS — I120 Hypertensive chronic kidney disease with stage 5 chronic kidney disease or end stage renal disease: Secondary | ICD-10-CM | POA: Insufficient documentation

## 2014-09-07 DIAGNOSIS — Z79899 Other long term (current) drug therapy: Secondary | ICD-10-CM | POA: Diagnosis not present

## 2014-09-07 DIAGNOSIS — N186 End stage renal disease: Secondary | ICD-10-CM | POA: Insufficient documentation

## 2014-09-07 DIAGNOSIS — R0602 Shortness of breath: Secondary | ICD-10-CM | POA: Insufficient documentation

## 2014-09-07 DIAGNOSIS — T8612 Kidney transplant failure: Secondary | ICD-10-CM

## 2014-09-07 DIAGNOSIS — Z720 Tobacco use: Secondary | ICD-10-CM | POA: Diagnosis not present

## 2014-09-07 DIAGNOSIS — N179 Acute kidney failure, unspecified: Secondary | ICD-10-CM

## 2014-09-07 DIAGNOSIS — K219 Gastro-esophageal reflux disease without esophagitis: Secondary | ICD-10-CM | POA: Diagnosis not present

## 2014-09-07 DIAGNOSIS — Z792 Long term (current) use of antibiotics: Secondary | ICD-10-CM | POA: Diagnosis not present

## 2014-09-07 DIAGNOSIS — M25512 Pain in left shoulder: Secondary | ICD-10-CM | POA: Diagnosis present

## 2014-09-07 DIAGNOSIS — M1990 Unspecified osteoarthritis, unspecified site: Secondary | ICD-10-CM | POA: Diagnosis not present

## 2014-09-07 DIAGNOSIS — Z940 Kidney transplant status: Secondary | ICD-10-CM | POA: Insufficient documentation

## 2014-09-07 DIAGNOSIS — T8611 Kidney transplant rejection: Secondary | ICD-10-CM

## 2014-09-07 DIAGNOSIS — N939 Abnormal uterine and vaginal bleeding, unspecified: Secondary | ICD-10-CM | POA: Insufficient documentation

## 2014-09-07 DIAGNOSIS — Z3202 Encounter for pregnancy test, result negative: Secondary | ICD-10-CM | POA: Diagnosis not present

## 2014-09-07 DIAGNOSIS — Z880 Allergy status to penicillin: Secondary | ICD-10-CM | POA: Insufficient documentation

## 2014-09-07 DIAGNOSIS — D649 Anemia, unspecified: Secondary | ICD-10-CM

## 2014-09-07 DIAGNOSIS — R000 Tachycardia, unspecified: Secondary | ICD-10-CM | POA: Insufficient documentation

## 2014-09-07 LAB — CBC WITH DIFFERENTIAL/PLATELET
BASOS PCT: 0 % (ref 0–1)
Basophils Absolute: 0 10*3/uL (ref 0.0–0.1)
EOS ABS: 0.5 10*3/uL (ref 0.0–0.7)
EOS PCT: 5 % (ref 0–5)
HEMATOCRIT: 15 % — AB (ref 36.0–46.0)
Hemoglobin: 4.8 g/dL — CL (ref 12.0–15.0)
LYMPHS ABS: 1.3 10*3/uL (ref 0.7–4.0)
LYMPHS PCT: 13 % (ref 12–46)
MCH: 25.7 pg — AB (ref 26.0–34.0)
MCHC: 32 g/dL (ref 30.0–36.0)
MCV: 80.2 fL (ref 78.0–100.0)
Monocytes Absolute: 0.5 10*3/uL (ref 0.1–1.0)
Monocytes Relative: 5 % (ref 3–12)
Neutro Abs: 7.5 10*3/uL (ref 1.7–7.7)
Neutrophils Relative %: 77 % (ref 43–77)
PLATELETS: 366 10*3/uL (ref 150–400)
RBC: 1.87 MIL/uL — ABNORMAL LOW (ref 3.87–5.11)
RDW: 16.3 % — ABNORMAL HIGH (ref 11.5–15.5)
WBC: 9.8 10*3/uL (ref 4.0–10.5)

## 2014-09-07 LAB — BASIC METABOLIC PANEL
Anion gap: 10 (ref 5–15)
BUN: 59 mg/dL — ABNORMAL HIGH (ref 6–23)
CALCIUM: 8.6 mg/dL (ref 8.4–10.5)
CO2: 18 mmol/L — AB (ref 19–32)
CREATININE: 7.81 mg/dL — AB (ref 0.50–1.10)
Chloride: 103 mmol/L (ref 96–112)
GFR calc non Af Amer: 6 mL/min — ABNORMAL LOW (ref >90)
GFR, EST AFRICAN AMERICAN: 6 mL/min — AB (ref >90)
GLUCOSE: 98 mg/dL (ref 70–99)
Potassium: 3.1 mmol/L — ABNORMAL LOW (ref 3.5–5.1)
Sodium: 131 mmol/L — ABNORMAL LOW (ref 135–145)

## 2014-09-07 LAB — URINE MICROSCOPIC-ADD ON

## 2014-09-07 LAB — I-STAT CHEM 8, ED
BUN: 66 mg/dL — AB (ref 6–23)
CALCIUM ION: 1.22 mmol/L (ref 1.12–1.23)
CREATININE: 7.8 mg/dL — AB (ref 0.50–1.10)
Chloride: 104 mmol/L (ref 96–112)
Glucose, Bld: 96 mg/dL (ref 70–99)
HCT: 16 % — ABNORMAL LOW (ref 36.0–46.0)
Hemoglobin: 5.4 g/dL — CL (ref 12.0–15.0)
Potassium: 3 mmol/L — ABNORMAL LOW (ref 3.5–5.1)
Sodium: 134 mmol/L — ABNORMAL LOW (ref 135–145)
TCO2: 15 mmol/L (ref 0–100)

## 2014-09-07 LAB — TYPE AND SCREEN
ABO/RH(D): A POS
ANTIBODY SCREEN: NEGATIVE

## 2014-09-07 LAB — URINALYSIS, ROUTINE W REFLEX MICROSCOPIC
BILIRUBIN URINE: NEGATIVE
Glucose, UA: NEGATIVE mg/dL
Ketones, ur: NEGATIVE mg/dL
Leukocytes, UA: NEGATIVE
Nitrite: NEGATIVE
Specific Gravity, Urine: 1.007 (ref 1.005–1.030)
Urobilinogen, UA: 0.2 mg/dL (ref 0.0–1.0)
pH: 6 (ref 5.0–8.0)

## 2014-09-07 LAB — BRAIN NATRIURETIC PEPTIDE: B NATRIURETIC PEPTIDE 5: 983.3 pg/mL — AB (ref 0.0–100.0)

## 2014-09-07 LAB — POC URINE PREG, ED: PREG TEST UR: NEGATIVE

## 2014-09-07 IMAGING — CR DG CHEST 2V
2 series · 2 of 2 positions shown · non-contrast
Comparison: Chest radiograph performed [DATE]

CLINICAL DATA: Acute onset of shortness of breath. Left upper chest
pain, radiating to the neck. Initial encounter.

EXAM:
CHEST  2 VIEW

[chest pa]
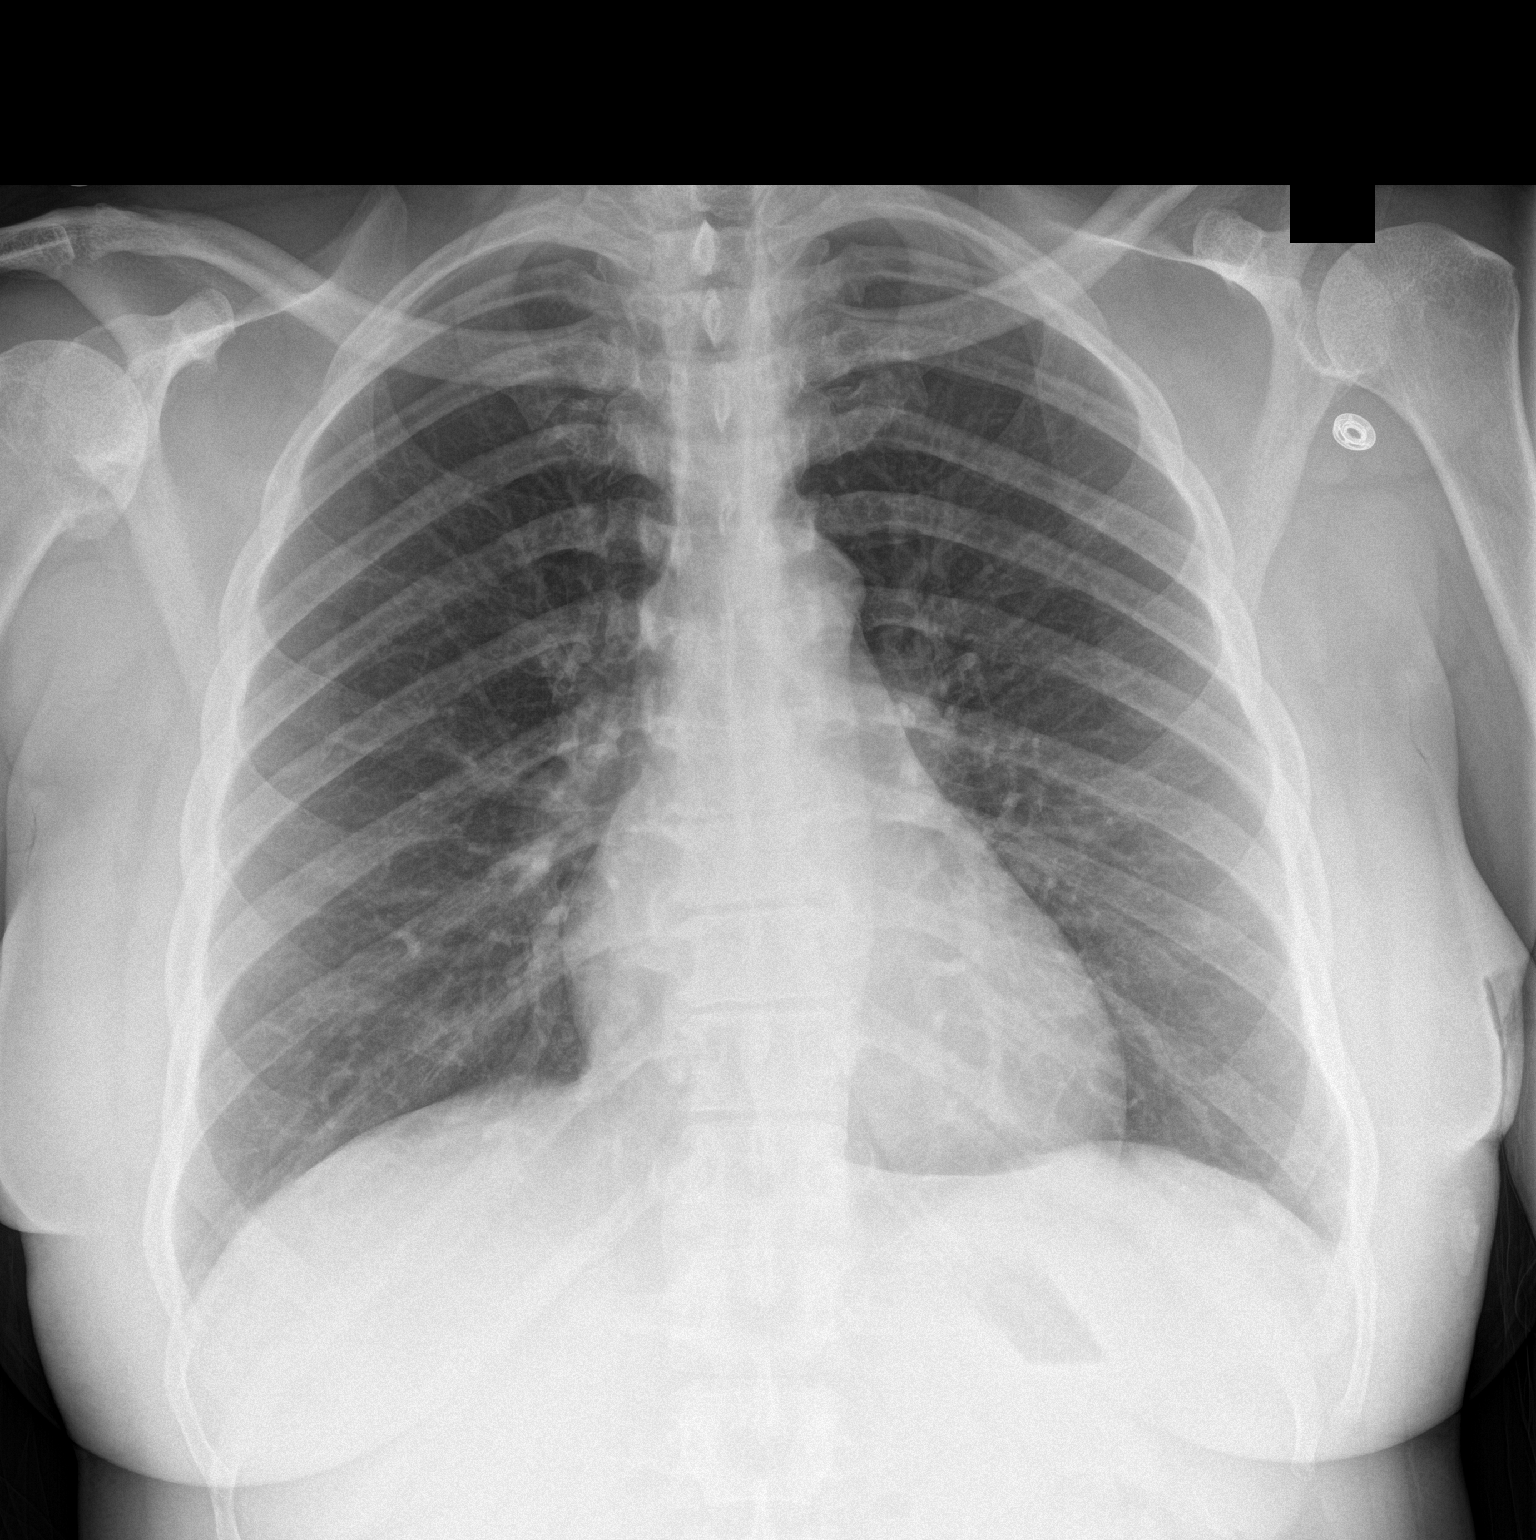

[chest lat]
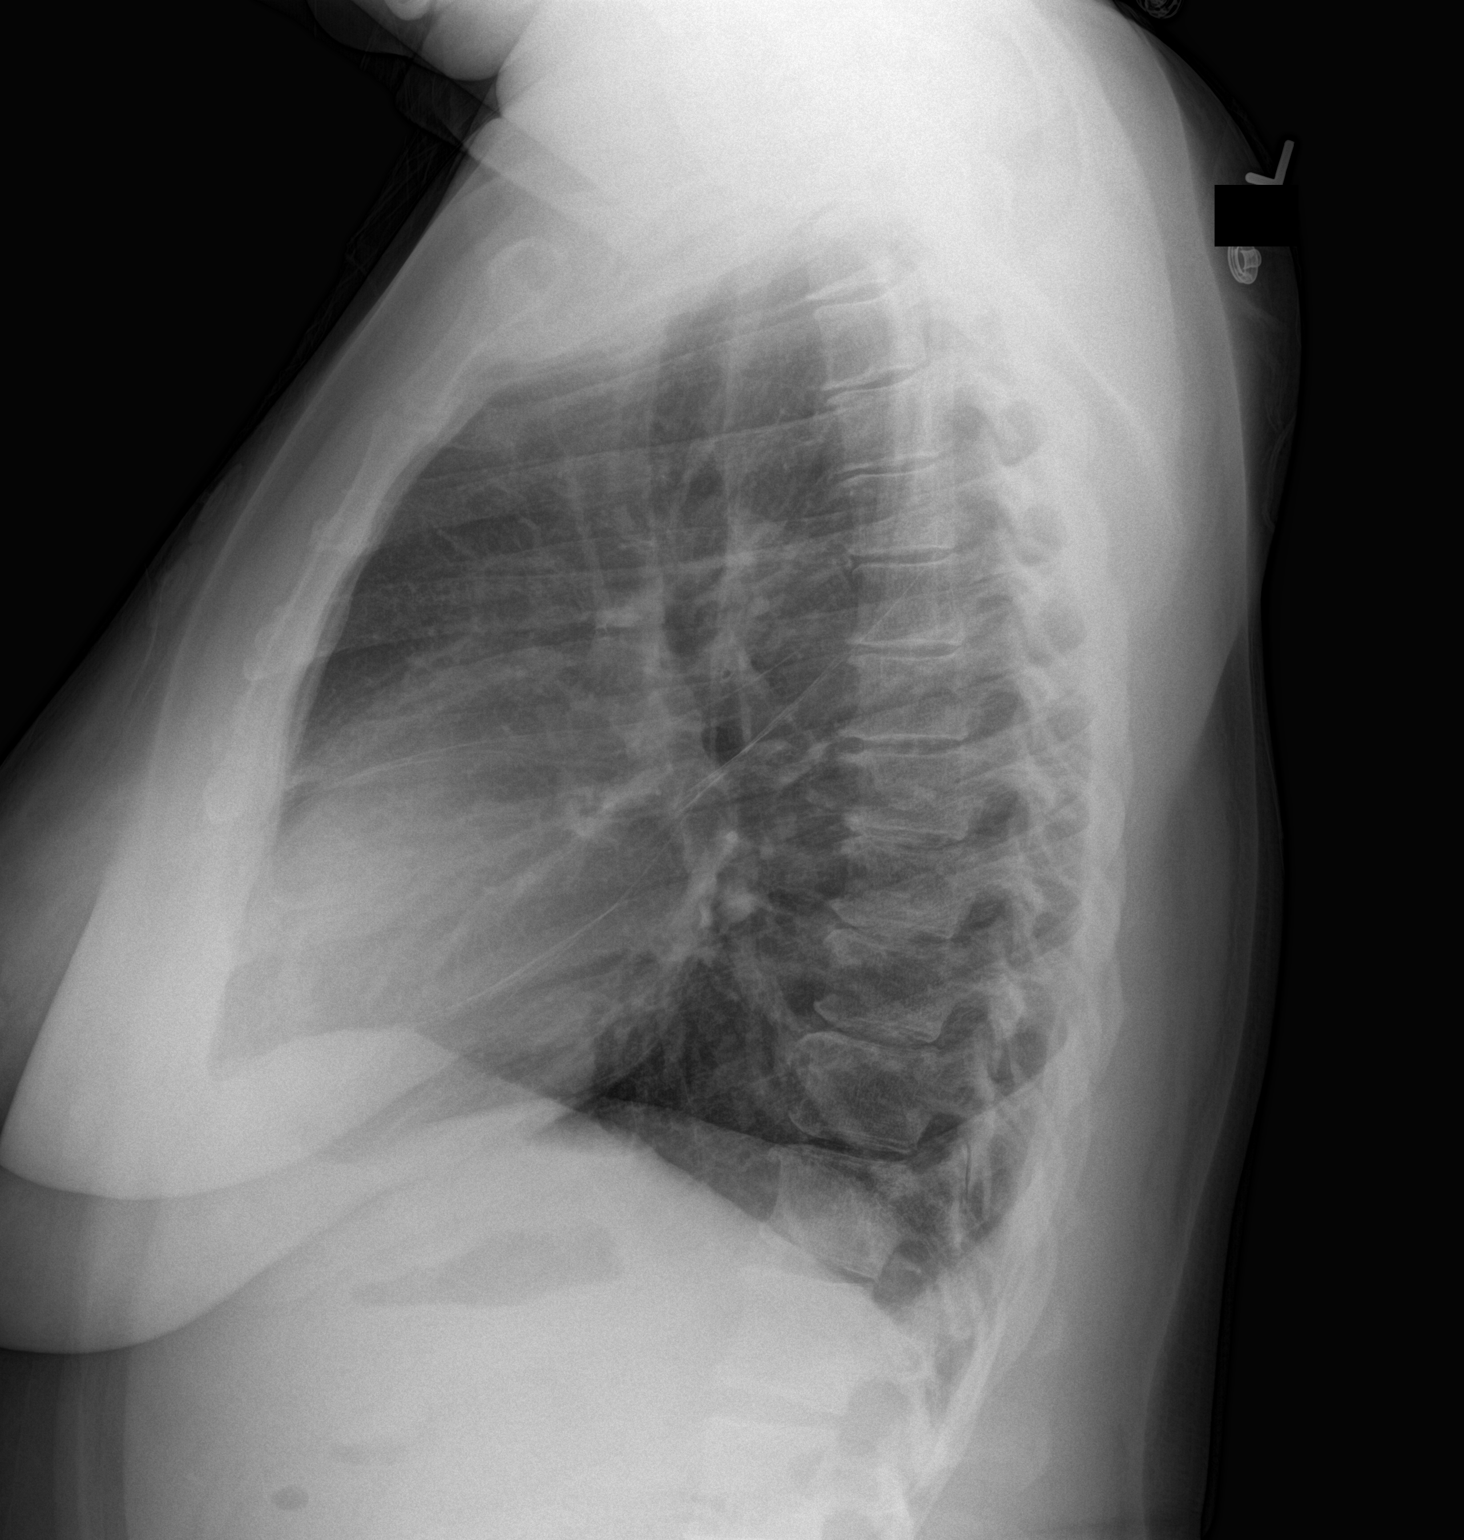

[2 of 2 positions shown; findings below may reference images not displayed]

FINDINGS: The lungs are well-aerated and clear. There is no evidence of focal
opacification, pleural effusion or pneumothorax.

The heart is normal in size; the mediastinal contour is within
normal limits. No acute osseous abnormalities are seen.
IMPRESSION: No acute cardiopulmonary process seen.

## 2014-09-07 NOTE — ED Provider Notes (Signed)
CSN: [id number]     Arrival date & time 09/07/14  0011 History  This chart was scribed for Kalman Drape, MD by Rayfield Citizen, ED Scribe. This patient was seen in room B14C/B14C and the patient's care was started at 12:47 AM.    Chief Complaint  Patient presents with  . Shoulder Pain   Patient is a 49 y.o. female presenting with shoulder pain. The history is provided by the patient. No language interpreter was used.  Shoulder Pain Associated symptoms: neck pain      HPI Comments: Sherelle Farney is a 49 y.o. female who presents to the Emergency Department complaining of approximately two weeks of left shoulder and left-sided neck pain, rated 8/10 at present. She has taken Tylenol and aspirin without relief. She also notes dyspnea on exertion: she is not able to walk across the house without "taking a break."   Patient explains that two weeks ago, she experienced several days of flu-like symptoms; fever (TMAX 101.7), rhinorrhea, productive cough, nausea, vomiting (not daily), appetite decrease. She reports that her symptoms have not improved, though she is no longer febrile. She has not discussed her recent symptoms with Dr. Jimmy Footman.   Patient regularly sees Dr. Jimmy Footman for nephrology concerns; she does not have a PCP at this time. She takes Cellcept and Prograft; there have been no recent changes in her dosages.   Past Medical History  Diagnosis Date  . Deceased-donor kidney transplant     Performed at [facility], April 2010.  Initial ESRD due to HTN nephropathy  . Anemia of chronic disease   . ESRD (end stage renal disease)     s/p transplant creatinine baseline 1.1  . History of hyperparathyroidism   . Hypertension   . Shortness of breath   . GERD (gastroesophageal reflux disease)   . Headache(784.0)   . Arthritis    Past Surgical History  Procedure Laterality Date  . Kidney transplant  11/2008    [facility])  . Av fistula placement    . Fracture surgery      left  foot,baby toe nad next toe missing   Family History  Problem Relation Age of Onset  . Diabetes Brother   . Hypertension Mother   . Hypertension Sister   . Hypertension Father   . Kidney disease Brother     on HD   History  Substance Use Topics  . Smoking status: Current Every Day Smoker -- 0.50 packs/day for 27 years    Types: Cigarettes  . Smokeless tobacco: Never Used     Comment: 10 cigarettes a day  . Alcohol Use: No     Comment: occasional drinker noted in the past   OB History    No data available     Review of Systems  Musculoskeletal: Positive for arthralgias (Left shoulder pain) and neck pain.   Allergies  Penicillins  Home Medications   Prior to Admission medications   Medication Sig Start Date End Date Taking? Authorizing Provider  acetaminophen-codeine (TYLENOL #3) 300-30 MG per tablet Take 1-2 tablets by mouth every 6 (six) hours as needed for moderate pain. 06/09/13   Saddie Benders. Ghim, MD  amLODipine (NORVASC) 10 MG tablet Take 10 mg by mouth daily.  03/01/11   Historical Provider, MD  aspirin 81 MG tablet Take 81 mg by mouth daily.     Historical Provider, MD  atenolol (TENORMIN) 50 MG tablet Take 50 mg by mouth 2 (two) times daily.  03/17/11  Historical Provider, MD  cephALEXin (KEFLEX) 250 MG capsule Take 250 mg by mouth daily.    Historical Provider, MD  cephALEXin (KEFLEX) 500 MG capsule Take 1 capsule (500 mg total) by mouth 4 (four) times daily. 06/09/13   Saddie Benders. Ghim, MD  diphenhydrAMINE (BENADRYL) 25 MG tablet Take 25 mg by mouth every 8 (eight) hours as needed (for cough).    Historical Provider, MD  docusate sodium (COLACE) 100 MG capsule Take 100 mg by mouth 2 (two) times daily as needed for constipation.     Historical Provider, MD  ferrous sulfate 325 (65 FE) MG tablet Take 650 mg by mouth 2 (two) times daily with a meal.     Historical Provider, MD  furosemide (LASIX) 40 MG tablet Take 40 mg by mouth 2 (two) times daily.  03/17/11   Historical  Provider, MD  KLOR-CON M20 20 MEQ tablet Take 20 mEq by mouth 2 (two) times daily.  01/15/11   Historical Provider, MD  mycophenolate (CELLCEPT) 250 MG capsule Take 1,000 mg by mouth 2 (two) times daily. Take 4 capsules by mouth twice daily.    Historical Provider, MD  tacrolimus (PROGRAF) 1 MG capsule Take 9-10 mg by mouth 2 (two) times daily. Take 10 capsules in the morning and takes 9 capsules at night    Historical Provider, MD   BP 134/73 mmHg  Pulse 96  Temp(Src) 98.6 F (37 C)  Resp 18  Ht 5\' 7"  (1.702 m)  Wt 180 lb (81.647 kg)  BMI 28.19 kg/m2  SpO2 100%  LMP 08/17/2014 Physical Exam  Constitutional: She is oriented to person, place, and time. She appears well-developed and well-nourished. No distress.  HENT:  Head: Normocephalic and atraumatic.  Mouth/Throat: Oropharynx is clear and moist. No oropharyngeal exudate.  Eyes: EOM are normal. Pupils are equal, round, and reactive to light.  Neck: Normal range of motion. Neck supple.  Cardiovascular: Normal rate and regular rhythm.  Exam reveals no gallop and no friction rub.   Murmur heard. Pulmonary/Chest: Effort normal. No respiratory distress. She has no wheezes. She has no rales.  Abdominal: Soft. There is no tenderness. There is no rebound and no guarding.  Musculoskeletal: Normal range of motion. She exhibits no edema.  Neurological: She is alert and oriented to person, place, and time.  Skin: Skin is warm and dry. No rash noted.  Psychiatric: She has a normal mood and affect. Her behavior is normal.  Nursing note and vitals reviewed.   ED Course  Procedures   DIAGNOSTIC STUDIES: Oxygen Saturation is 100% on RA, normal by my interpretation.    COORDINATION OF CARE: 12:56 AM Discussed treatment plan with pt at bedside and pt agreed to plan.   Labs Review Labs Reviewed  CBC WITH DIFFERENTIAL/PLATELET - Abnormal; Notable for the following:    RBC 1.87 (*)    Hemoglobin 4.8 (*)    HCT 15.0 (*)    MCH 25.7 (*)     RDW 16.3 (*)    All other components within normal limits  BRAIN NATRIURETIC PEPTIDE - Abnormal; Notable for the following:    B Natriuretic Peptide 983.3 (*)    All other components within normal limits  BASIC METABOLIC PANEL - Abnormal; Notable for the following:    Sodium 131 (*)    Potassium 3.1 (*)    CO2 18 (*)    BUN 59 (*)    Creatinine, Ser 7.81 (*)    GFR calc non Af Amer 6 (*)  GFR calc Af Amer 6 (*)    All other components within normal limits  I-STAT CHEM 8, ED - Abnormal; Notable for the following:    Sodium 134 (*)    Potassium 3.0 (*)    BUN 66 (*)    Creatinine, Ser 7.80 (*)    Hemoglobin 5.4 (*)    HCT 16.0 (*)    All other components within normal limits  URINALYSIS, ROUTINE W REFLEX MICROSCOPIC  MYCOPHENOLIC ACID (CELLCEPT)  TACROLIMUS LEVEL  POC URINE PREG, ED  TYPE AND SCREEN    Imaging Review Dg Chest 2 View  09/07/2014   CLINICAL DATA:  Acute onset of shortness of breath. Left upper chest pain, radiating to the neck. Initial encounter.  EXAM: CHEST  2 VIEW  COMPARISON:  Chest radiograph performed 06/09/2013  FINDINGS: The lungs are well-aerated and clear. There is no evidence of focal opacification, pleural effusion or pneumothorax.  The heart is normal in size; the mediastinal contour is within normal limits. No acute osseous abnormalities are seen.  IMPRESSION: No acute cardiopulmonary process seen.   Electronically Signed   By: Garald Balding M.D.   On: 09/07/2014 01:46     EKG Interpretation   Date/Time:  Thursday September 07 2014 01:23:49 EST Ventricular Rate:  73 PR Interval:  154 QRS Duration: 95 QT Interval:  456 QTC Calculation: 502 R Axis:   56 Text Interpretation:  Sinus rhythm Abnormal R-wave progression, early  transition Borderline prolonged QT interval Confirmed by Zareah Hunzeker  MD, Moishy Laday  ([id number]) on 09/07/2014 3:34:01 AM     CRITICAL CARE Performed by: Kalman Drape Total critical care time: 30 min Critical care time was exclusive of  separately billable procedures and treating other patients. Critical care was necessary to treat or prevent imminent or life-threatening deterioration. Critical care was time spent personally by me on the following activities: development of treatment plan with patient and/or surrogate as well as nursing, discussions with consultants, evaluation of patient's response to treatment, examination of patient, obtaining history from patient or surrogate, ordering and performing treatments and interventions, ordering and review of laboratory studies, ordering and review of radiographic studies, pulse oximetry and re-evaluation of patient's condition.  MDM   Final diagnoses:  SOB (shortness of breath)  Acute renal failure, unspecified acute renal failure type  Renal transplant failure and rejection  Symptomatic anemia   I personally performed the services described in this documentation, which was scribed in my presence. The recorded information has been reviewed and is accurate.  Patient with 2-3 weeks of worsening shortness of breath, left neck and shoulder pain.  Pain appears to be musculoskeletal in origin.  She has history of renal transplant in 2010.  Plan for labs, chest x-ray, EKG.  Patient does not appear to be acutely fluid overloaded, has no history of congestive heart failure.  3:34 AM Labs show significant changes: renal failure/transplant failure, anemia.  Pt refuses blood transfusion.  Case discussed with Dr Marval Regal who recommends close f/u with transplant clinic, does not recommend dialysis emergently.  4:40 AM Case d/c [facility] Transplant team.  Dr Stann Mainland with transplant team recommends transfer and admission to nephrology.  Dr Genene Churn with nephrology accepts patient.  Pt updated on findings and plan.  4:47 AM Dr Lorene Dy, IM resident, will accept patient to their service.   Kalman Drape, MD 09/07/14 [phone]

## 2014-09-07 NOTE — ED Notes (Addendum)
Pt c/o left shoulder pain radiating into left side of neck, onset 2 weeks ago when she had flu type symptoms.  Pt also c/o shortness of breath on exertion.  Pt speaking in full sentences.  Pt also st's she feels like she is retaining fluid

## 2014-09-07 NOTE — ED Notes (Signed)
[facility] called to inquire about bed request.

## 2014-09-07 NOTE — ED Notes (Signed)
MD informed pt is opposed to a blood transfusion, MD at [facility]. XRAY called and informed pt is ready for her chest XRAY.

## 2014-09-08 DIAGNOSIS — D62 Acute posthemorrhagic anemia: Secondary | ICD-10-CM | POA: Insufficient documentation

## 2014-09-09 LAB — MYCOPHENOLIC ACID (CELLCEPT)
MPA Glucuronide: 151 ug/mL — ABNORMAL HIGH (ref 35.0–100.0)
MPA: 3.7 ug/mL — ABNORMAL HIGH (ref 1.0–3.5)

## 2014-09-09 LAB — TACROLIMUS LEVEL: Tacrolimus (FK506) - LabCorp: 3.6 ng/mL (ref 2.0–20.0)

## 2014-09-10 ENCOUNTER — Telehealth (HOSPITAL_COMMUNITY): Payer: Self-pay

## 2014-09-10 NOTE — Telephone Encounter (Signed)
Call from [place] with critical.  PP RN informed pt transferred to [facility] and she would need to call report there and she refused stating could only call results to ordering facility.  09/09/14 PP RN faxed a copy of critical lab to [place] (fax) [phone]

## 2014-09-20 DIAGNOSIS — H109 Unspecified conjunctivitis: Secondary | ICD-10-CM | POA: Insufficient documentation

## 2014-09-22 DIAGNOSIS — Z91199 Patient's noncompliance with other medical treatment and regimen due to unspecified reason: Secondary | ICD-10-CM | POA: Insufficient documentation

## 2014-09-22 DIAGNOSIS — Z9119 Patient's noncompliance with other medical treatment and regimen: Secondary | ICD-10-CM | POA: Insufficient documentation

## 2014-09-22 DIAGNOSIS — E663 Overweight: Secondary | ICD-10-CM | POA: Insufficient documentation

## 2014-09-22 DIAGNOSIS — H1132 Conjunctival hemorrhage, left eye: Secondary | ICD-10-CM | POA: Insufficient documentation

## 2014-09-22 DIAGNOSIS — N2581 Secondary hyperparathyroidism of renal origin: Secondary | ICD-10-CM | POA: Insufficient documentation

## 2014-09-22 DIAGNOSIS — I158 Other secondary hypertension: Secondary | ICD-10-CM | POA: Insufficient documentation

## 2014-09-22 DIAGNOSIS — Z48298 Encounter for aftercare following other organ transplant: Secondary | ICD-10-CM | POA: Insufficient documentation

## 2014-10-12 DIAGNOSIS — E1122 Type 2 diabetes mellitus with diabetic chronic kidney disease: Secondary | ICD-10-CM | POA: Insufficient documentation

## 2014-10-24 ENCOUNTER — Other Ambulatory Visit: Payer: Self-pay

## 2014-10-24 ENCOUNTER — Encounter: Payer: Self-pay | Admitting: Vascular Surgery

## 2014-10-24 DIAGNOSIS — Z01810 Encounter for preprocedural cardiovascular examination: Secondary | ICD-10-CM

## 2014-10-24 DIAGNOSIS — N186 End stage renal disease: Secondary | ICD-10-CM

## 2014-10-25 ENCOUNTER — Ambulatory Visit (HOSPITAL_COMMUNITY)
Admission: RE | Admit: 2014-10-25 | Discharge: 2014-10-25 | Disposition: A | Discharge status: Home / Self Care | Payer: BLUE CROSS/BLUE SHIELD | Source: Ambulatory Visit | Attending: Vascular Surgery | Admitting: Vascular Surgery

## 2014-10-25 ENCOUNTER — Encounter: Payer: Self-pay | Admitting: Vascular Surgery

## 2014-10-25 ENCOUNTER — Ambulatory Visit (INDEPENDENT_AMBULATORY_CARE_PROVIDER_SITE_OTHER): Payer: BLUE CROSS/BLUE SHIELD | Admitting: Vascular Surgery

## 2014-10-25 ENCOUNTER — Ambulatory Visit (INDEPENDENT_AMBULATORY_CARE_PROVIDER_SITE_OTHER)
Admission: RE | Admit: 2014-10-25 | Discharge: 2014-10-25 | Disposition: A | Discharge status: Home / Self Care | Payer: BLUE CROSS/BLUE SHIELD | Source: Ambulatory Visit | Attending: Vascular Surgery | Admitting: Vascular Surgery

## 2014-10-25 VITALS — BP 128/88 | HR 81 | Ht 67.0 in | Wt 177.9 lb

## 2014-10-25 DIAGNOSIS — N186 End stage renal disease: Secondary | ICD-10-CM

## 2014-10-25 DIAGNOSIS — Z01810 Encounter for preprocedural cardiovascular examination: Secondary | ICD-10-CM

## 2014-10-25 NOTE — Progress Notes (Signed)
[facility]  Patient name: Ruth Gutierrez MRN: [id number] DOB: [birth year] Sex: female  REASON FOR CONSULT: evaluate for hemodialysis access. Referred by Dr. Jimmy Footman  HPI: Ruth Gutierrez is a 49 y.o. female who is not yet on dialysis. She had a kidney transplant which ultimately failed. Previously she had a left brachiocephalic AV fistula which is not functional. This was done 5 or 6 years ago. She has had some fatigue but attributes this to her anemia. She denies any other uremic symptoms including nausea, vomiting, anorexia, or palpitations. She is right-handed.  I have reviewed her records from [facility]. She has advanced stage IV chronic kidney disease related to hypertension. She had previously been on dialysis for about 2 years before her transplant.  I reviewed her most recent labs which show a GFR of 9 on 10/19/2014. Hemoglobin was 8.1. She does also have a history of secondary hyperparathyroidism and anemia of chronic disease.   Past Medical History  Diagnosis Date  . Deceased-donor kidney transplant     Performed at [facility], April 2010.  Initial ESRD due to HTN nephropathy  . Anemia of chronic disease   . ESRD (end stage renal disease)     s/p transplant creatinine baseline 1.1  . History of hyperparathyroidism   . Hypertension   . Shortness of breath   . GERD (gastroesophageal reflux disease)   . Headache(784.0)   . Arthritis    Family History  Problem Relation Age of Onset  . Diabetes Brother   . Deep vein thrombosis Brother   . Hypertension Mother   . Hypertension Sister   . Hyperlipidemia Sister   . Hypertension Father   . Kidney disease Brother     on HD   SOCIAL HISTORY: History  Substance Use Topics  . Smoking status: Current Every Day Smoker -- 0.50 packs/day for 27 years    Types: Cigarettes  . Smokeless tobacco: Never Used     Comment: 10 cigarettes a day  . Alcohol Use: No     Comment:  occasional drinker noted in the past   Allergies  Allergen Reactions  . Penicillins Other (See Comments)    Itchiness   Current Outpatient Prescriptions  Medication Sig Dispense Refill  . acetaminophen-codeine (TYLENOL #3) 300-30 MG per tablet Take 1-2 tablets by mouth every 6 (six) hours as needed for moderate pain. (Patient not taking: Reported on 09/07/2014) 15 tablet 0  . amLODipine (NORVASC) 10 MG tablet Take 10 mg by mouth daily.     Marland Kitchen aspirin 81 MG tablet Take 81 mg by mouth daily.     Marland Kitchen atenolol (TENORMIN) 50 MG tablet Take 50 mg by mouth 2 (two) times daily.     . calcitRIOL (ROCALTROL) 0.5 MCG capsule Take 0.5 mcg by mouth daily.    . cephALEXin (KEFLEX) 250 MG capsule Take 250 mg by mouth daily.    . cephALEXin (KEFLEX) 500 MG capsule Take 1 capsule (500 mg total) by mouth 4 (four) times daily. (Patient not taking: Reported on 09/07/2014) 20 capsule 0  . diphenhydrAMINE (BENADRYL) 25 MG tablet Take 25 mg by mouth every 8 (eight) hours as needed (for cough).    . docusate sodium (COLACE) 100 MG capsule Take 100 mg by mouth 2 (two) times daily as needed for constipation.     . ferrous sulfate 325 (65 FE) MG tablet Take 650 mg by mouth 2 (two) times daily with a meal.     .  furosemide (LASIX) 40 MG tablet Take 40 mg by mouth 2 (two) times daily.     Marland Kitchen KLOR-CON M20 20 MEQ tablet Take 20 mEq by mouth 2 (two) times daily.     . mycophenolate (CELLCEPT) 250 MG capsule Take 1,000 mg by mouth 2 (two) times daily. Take 4 capsules by mouth twice daily.    . tacrolimus (PROGRAF) 1 MG capsule Take 9-10 mg by mouth 2 (two) times daily. Take 10 capsules in the morning and takes 9 capsules at night     No current facility-administered medications for this visit.   REVIEW OF SYSTEMS: Valu.Nieves ] denotes positive finding; [  ] denotes negative finding  CARDIOVASCULAR:  [ ]  chest pain   [ ]  chest pressure   [ ]  palpitations   [ ]  orthopnea   [ ]  dyspnea on exertion   [ ]  claudication   [ ]  rest pain   [ ]   DVT   [ ]  phlebitis PULMONARY:   [ ]  productive cough   [ ]  asthma   [ ]  wheezing NEUROLOGIC:   [ ]  weakness  [ ]  paresthesias  [ ]  aphasia  [ ]  amaurosis  [ ]  dizziness HEMATOLOGIC:   [ ]  bleeding problems   [ ]  clotting disorders MUSCULOSKELETAL:  [ ]  joint pain   [ ]  joint swelling [ ]  leg swelling GASTROINTESTINAL: [ ]   blood in stool  [ ]   hematemesis GENITOURINARY:  [ ]   dysuria  [ ]   hematuria PSYCHIATRIC:  [ ]  history of major depression INTEGUMENTARY:  [ ]  rashes  [ ]  ulcers CONSTITUTIONAL:  [ ]  fever   [ ]  chills  PHYSICAL EXAM: Filed Vitals:   10/25/14 1514  BP: 128/88  Pulse: 81  Height: 5\' 7"  (1.702 m)  Weight: 177 lb 14.4 oz (80.695 kg)  SpO2: 100%   Body mass index is 27.86 kg/(m^2). GENERAL: The patient is a well-nourished female, in no acute distress. The vital signs are documented above. CARDIOVASCULAR: There is a regular rate and rhythm. I do not detect carotid bruits. She has palpable radial pulses. PULMONARY: There is good air exchange bilaterally without wheezing or rales. ABDOMEN: Soft and non-tender with normal pitched bowel sounds.  MUSCULOSKELETAL: There are no major deformities or cyanosis. NEUROLOGIC: No focal weakness or paresthesias are detected. SKIN: There are no ulcers or rashes noted. PSYCHIATRIC: The patient has a normal affect.  DATA:  I have independently interpreted her vein mapping which shows that her forearm and upper arm cephalic vein on the right are very small and did not appear to be adequate for a fistula. The basilic vein is also quite small on the right. The only potential option on the left for a fistula would be a basilic vein transposition in the vein here is marginal in size.  I have independently interpreted her arterial Doppler study today which shows triphasic Doppler signals in the radial and ulnar positions bilaterally.  MEDICAL ISSUES:  STAGE IV CHRONIC KIDNEY DISEASE: I discussed the patient today over the phone with Dr.  Jimmy Footman. He would like new access and a catheter placed in the next week. I think that she could potentially have a basilic vein transposition on the left otherwise she would require an AV graft. She feels strongly about not having an AV graft but after extensive discussion she states that she would be agreeable to have a graft if a fistula were not possible. I explained that we would certainly give her the  benefit of the doubt if the vein looked at all reasonable. I have discussed the indications for the procedure and the potential complications and she is agreeable to proceed. In addition I discussed the indications for placement of a tunneled dialysis catheter and the potential complications. All her questions were answered and she is agreeable to proceed.   [place] [facility] [place]: [phone]

## 2014-10-26 ENCOUNTER — Other Ambulatory Visit: Payer: Self-pay

## 2014-10-27 ENCOUNTER — Encounter (HOSPITAL_COMMUNITY): Payer: Self-pay | Admitting: *Deleted

## 2014-10-27 NOTE — Progress Notes (Signed)
Pt denies SOB, chest pain, and being under the care of a cardiologist. Pt denies having a stress test and cardiac cath. Pt made aware to stop otc vitamins, herbal medications and NSAID's.

## 2014-10-29 MED ORDER — CIPROFLOXACIN IN D5W 400 MG/200ML IV SOLN
400.0000 mg | INTRAVENOUS | Status: AC
  Administered 2014-10-30: 400 mg via INTRAVENOUS
  Filled 2014-10-29: qty 200

## 2014-10-29 MED ORDER — SODIUM CHLORIDE 0.9 % IV SOLN: INTRAVENOUS | Status: DC

## 2014-10-30 ENCOUNTER — Encounter (HOSPITAL_COMMUNITY): Payer: BLUE CROSS/BLUE SHIELD

## 2014-10-30 ENCOUNTER — Ambulatory Visit (HOSPITAL_COMMUNITY)
Admission: RE | Admit: 2014-10-30 | Discharge: 2014-10-30 | Disposition: A | Discharge status: Home / Self Care | Payer: BLUE CROSS/BLUE SHIELD | Source: Ambulatory Visit | Attending: Vascular Surgery | Admitting: Vascular Surgery

## 2014-10-30 ENCOUNTER — Ambulatory Visit (HOSPITAL_COMMUNITY): Payer: BLUE CROSS/BLUE SHIELD

## 2014-10-30 ENCOUNTER — Ambulatory Visit (HOSPITAL_COMMUNITY): Payer: BLUE CROSS/BLUE SHIELD | Admitting: Anesthesiology

## 2014-10-30 ENCOUNTER — Encounter (HOSPITAL_COMMUNITY)
Admission: RE | Disposition: A | Discharge status: Home / Self Care | Payer: Self-pay | Source: Ambulatory Visit | Attending: Vascular Surgery

## 2014-10-30 ENCOUNTER — Encounter (HOSPITAL_COMMUNITY): Payer: Self-pay | Admitting: *Deleted

## 2014-10-30 DIAGNOSIS — F17210 Nicotine dependence, cigarettes, uncomplicated: Secondary | ICD-10-CM | POA: Insufficient documentation

## 2014-10-30 DIAGNOSIS — N185 Chronic kidney disease, stage 5: Secondary | ICD-10-CM | POA: Diagnosis not present

## 2014-10-30 DIAGNOSIS — K219 Gastro-esophageal reflux disease without esophagitis: Secondary | ICD-10-CM | POA: Insufficient documentation

## 2014-10-30 DIAGNOSIS — Z940 Kidney transplant status: Secondary | ICD-10-CM | POA: Diagnosis not present

## 2014-10-30 DIAGNOSIS — N186 End stage renal disease: Secondary | ICD-10-CM | POA: Insufficient documentation

## 2014-10-30 DIAGNOSIS — Z789 Other specified health status: Secondary | ICD-10-CM

## 2014-10-30 DIAGNOSIS — Z7982 Long term (current) use of aspirin: Secondary | ICD-10-CM | POA: Diagnosis not present

## 2014-10-30 DIAGNOSIS — Z992 Dependence on renal dialysis: Secondary | ICD-10-CM | POA: Diagnosis not present

## 2014-10-30 DIAGNOSIS — D631 Anemia in chronic kidney disease: Secondary | ICD-10-CM | POA: Insufficient documentation

## 2014-10-30 DIAGNOSIS — I120 Hypertensive chronic kidney disease with stage 5 chronic kidney disease or end stage renal disease: Secondary | ICD-10-CM | POA: Diagnosis not present

## 2014-10-30 DIAGNOSIS — N2581 Secondary hyperparathyroidism of renal origin: Secondary | ICD-10-CM | POA: Insufficient documentation

## 2014-10-30 DIAGNOSIS — Z880 Allergy status to penicillin: Secondary | ICD-10-CM | POA: Diagnosis not present

## 2014-10-30 DIAGNOSIS — Z419 Encounter for procedure for purposes other than remedying health state, unspecified: Secondary | ICD-10-CM

## 2014-10-30 DIAGNOSIS — J942 Hemothorax: Secondary | ICD-10-CM

## 2014-10-30 HISTORY — DX: Dermatitis, unspecified: L30.9

## 2014-10-30 HISTORY — DX: Presence of spectacles and contact lenses: Z97.3

## 2014-10-30 HISTORY — PX: BASCILIC VEIN TRANSPOSITION: SHX5742

## 2014-10-30 HISTORY — PX: INSERTION OF DIALYSIS CATHETER: SHX1324

## 2014-10-30 LAB — POCT I-STAT 4, (NA,K, GLUC, HGB,HCT)
Glucose, Bld: 116 mg/dL — ABNORMAL HIGH (ref 70–99)
HEMATOCRIT: 23 % — AB (ref 36.0–46.0)
Hemoglobin: 7.8 g/dL — ABNORMAL LOW (ref 12.0–15.0)
POTASSIUM: 3.3 mmol/L — AB (ref 3.5–5.1)
SODIUM: 141 mmol/L (ref 135–145)

## 2014-10-30 LAB — GLUCOSE, CAPILLARY
Glucose-Capillary: 72 mg/dL (ref 70–99)
Glucose-Capillary: 81 mg/dL (ref 70–99)

## 2014-10-30 LAB — HCG, SERUM, QUALITATIVE: Preg, Serum: NEGATIVE

## 2014-10-30 IMAGING — CR DG CHEST 1V PORT
1 series · 1 of 1 positions shown · non-contrast
Comparison: [DATE]

CLINICAL DATA: Port-A-Cath placement

EXAM:
PORTABLE CHEST - 1 VIEW

[portable]
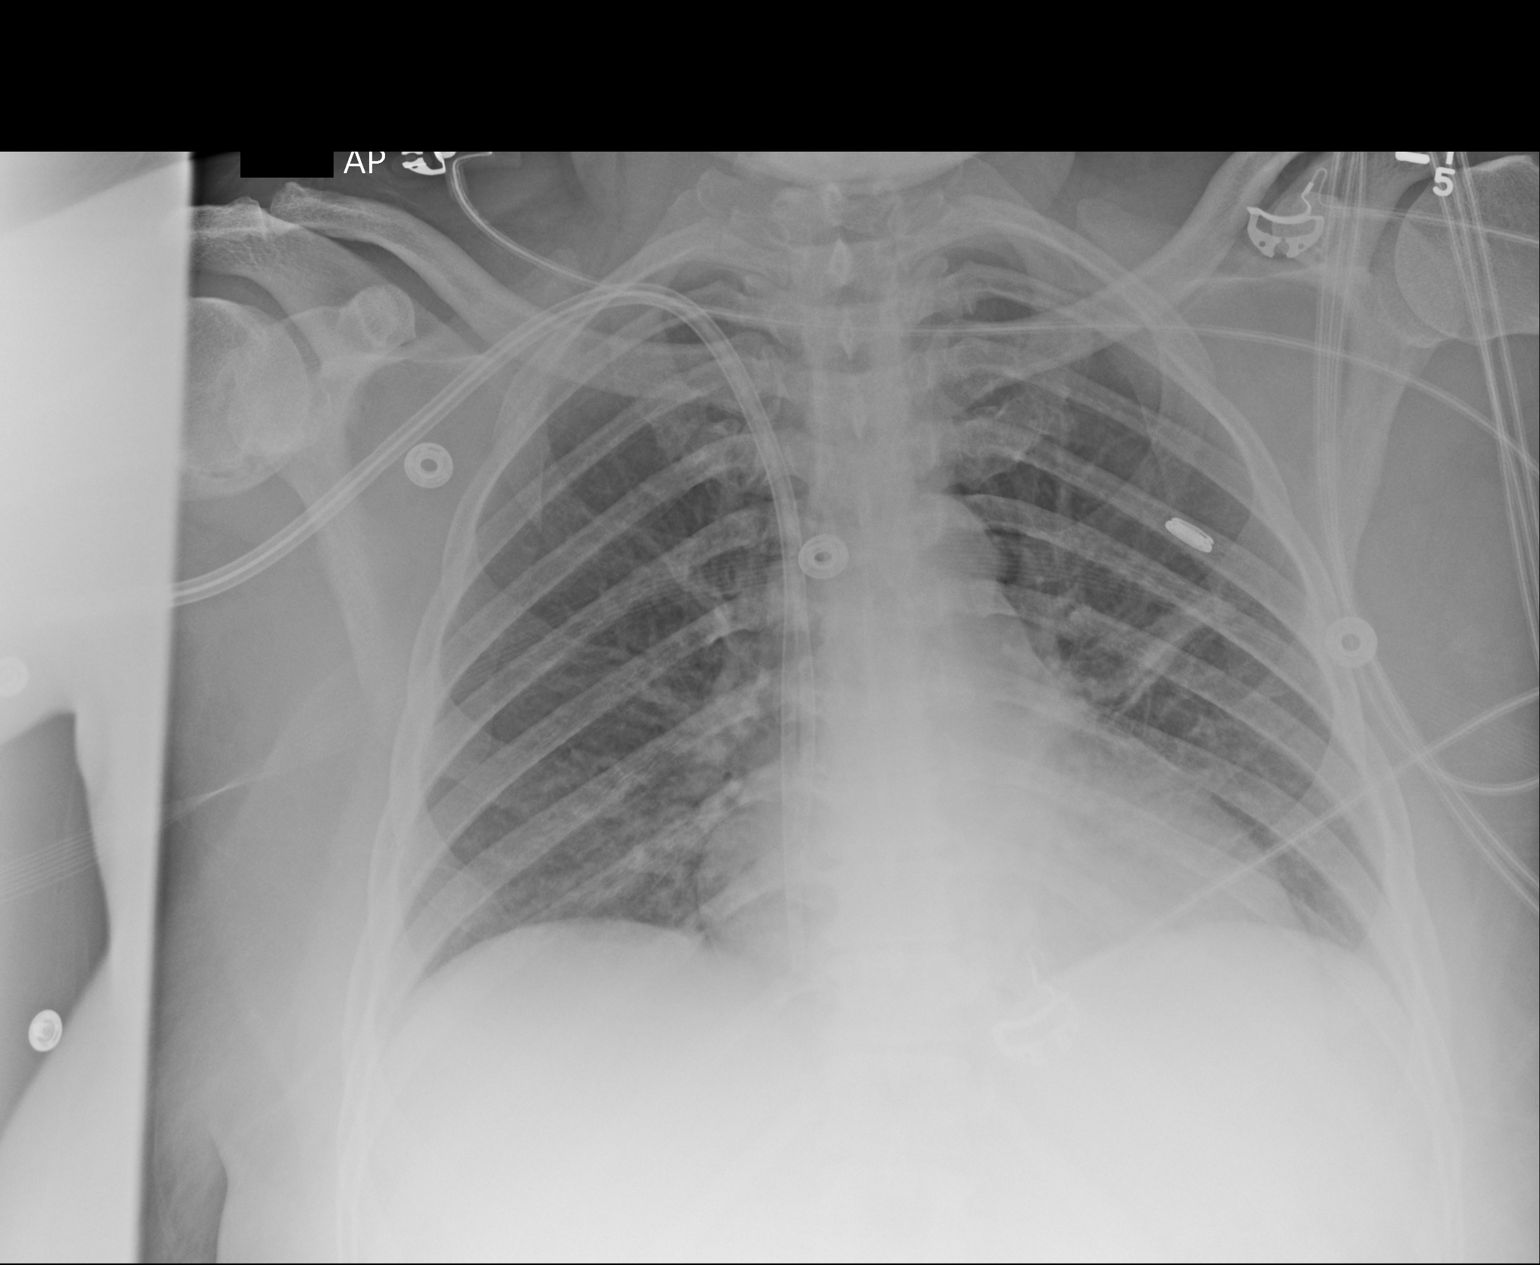

[1 of 1 positions shown; findings below may reference images not displayed]

FINDINGS: There is a dual-lumen catheter with tips in the right atrium. No
pneumothorax. There is atelectatic change in the left mid lung.
Lungs are otherwise clear. Heart is upper normal in size with normal
pulmonary vascularity. No adenopathy. No bone lesions.
IMPRESSION: Left midlung atelectatic change. Lungs elsewhere clear. Central
catheter with tips in right atrium. No pneumothorax.

## 2014-10-30 SURGERY — TRANSPOSITION, VEIN, BASILIC
Anesthesia: Monitor Anesthesia Care | Site: Neck | Laterality: Right

## 2014-10-30 MED ORDER — PROPOFOL INFUSION 10 MG/ML OPTIME
INTRAVENOUS | Status: DC | PRN
  Administered 2014-10-30: 75 ug/kg/min via INTRAVENOUS
  Administered 2014-10-30: 11:00:00 via INTRAVENOUS

## 2014-10-30 MED ORDER — CHLORHEXIDINE GLUCONATE CLOTH 2 % EX PADS: 6.0000 | MEDICATED_PAD | Freq: Once | CUTANEOUS | Status: DC

## 2014-10-30 MED ORDER — ACETAMINOPHEN-CODEINE #3 300-30 MG PO TABS
1.0000 | ORAL_TABLET | ORAL | Status: DC | PRN
  Administered 2014-10-30: 1 via ORAL
  Filled 2014-10-30 (×2): qty 1

## 2014-10-30 MED ORDER — LIDOCAINE-EPINEPHRINE 0.5 %-1:200000 IJ SOLN
INTRAMUSCULAR | Status: AC
  Filled 2014-10-30: qty 1

## 2014-10-30 MED ORDER — HYDROMORPHONE HCL 1 MG/ML IJ SOLN: 0.2500 mg | INTRAMUSCULAR | Status: DC | PRN

## 2014-10-30 MED ORDER — PROMETHAZINE HCL 25 MG/ML IJ SOLN: 6.2500 mg | INTRAMUSCULAR | Status: DC | PRN

## 2014-10-30 MED ORDER — SODIUM CHLORIDE 0.9 % IV SOLN
INTRAVENOUS | Status: DC
  Administered 2014-10-30: 08:00:00 via INTRAVENOUS

## 2014-10-30 MED ORDER — FENTANYL CITRATE 0.05 MG/ML IJ SOLN
INTRAMUSCULAR | Status: DC | PRN
  Administered 2014-10-30 (×3): 50 ug via INTRAVENOUS

## 2014-10-30 MED ORDER — MIDAZOLAM HCL 2 MG/2ML IJ SOLN
INTRAMUSCULAR | Status: AC
  Filled 2014-10-30: qty 2

## 2014-10-30 MED ORDER — LIDOCAINE-EPINEPHRINE 0.5 %-1:200000 IJ SOLN
INTRAMUSCULAR | Status: DC | PRN
Start: 2014-10-30 — End: 2014-10-30
  Administered 2014-10-30: 17 mL

## 2014-10-30 MED ORDER — MEPERIDINE HCL 25 MG/ML IJ SOLN: 6.2500 mg | INTRAMUSCULAR | Status: DC | PRN

## 2014-10-30 MED ORDER — HEPARIN SODIUM (PORCINE) 1000 UNIT/ML IJ SOLN
INTRAMUSCULAR | Status: DC | PRN
  Administered 2014-10-30: 4.6 mL

## 2014-10-30 MED ORDER — SODIUM CHLORIDE 0.9 % IR SOLN
Status: DC | PRN
  Administered 2014-10-30: 500 mL

## 2014-10-30 MED ORDER — HEPARIN SODIUM (PORCINE) 1000 UNIT/ML IJ SOLN
INTRAMUSCULAR | Status: AC
  Filled 2014-10-30: qty 1

## 2014-10-30 MED ORDER — ACETAMINOPHEN-CODEINE #3 300-30 MG PO TABS: 1.0000 | ORAL_TABLET | Freq: Four times a day (QID) | ORAL | Status: DC | PRN

## 2014-10-30 MED ORDER — 0.9 % SODIUM CHLORIDE (POUR BTL) OPTIME
TOPICAL | Status: DC | PRN
Start: 2014-10-30 — End: 2014-10-30
  Administered 2014-10-30: 1000 mL

## 2014-10-30 MED ORDER — FENTANYL CITRATE 0.05 MG/ML IJ SOLN
INTRAMUSCULAR | Status: AC
  Filled 2014-10-30: qty 5

## 2014-10-30 MED ORDER — ONDANSETRON HCL 4 MG/2ML IJ SOLN
INTRAMUSCULAR | Status: DC | PRN
  Administered 2014-10-30: 4 mg via INTRAVENOUS

## 2014-10-30 MED ORDER — MIDAZOLAM HCL 5 MG/5ML IJ SOLN
INTRAMUSCULAR | Status: DC | PRN
  Administered 2014-10-30: 2 mg via INTRAVENOUS

## 2014-10-30 SURGICAL SUPPLY — 63 items
BAG DECANTER FOR FLEXI CONT (MISCELLANEOUS) IMPLANT
BAG SNAP BAND KOVER 36X36 (MISCELLANEOUS) ×4 IMPLANT
BENZOIN TINCTURE PRP APPL 2/3 (GAUZE/BANDAGES/DRESSINGS) ×4 IMPLANT
BIOPATCH RED 1 DISK 7.0 (GAUZE/BANDAGES/DRESSINGS) ×3 IMPLANT
BIOPATCH RED 1IN DISK 7.0MM (GAUZE/BANDAGES/DRESSINGS) ×1
CANISTER SUCTION 2500CC (MISCELLANEOUS) ×4 IMPLANT
CANNULA VESSEL 3MM 2 BLNT TIP (CANNULA) ×4 IMPLANT
CATH CANNON HEMO 15F 50CM (CATHETERS) IMPLANT
CATH CANNON HEMO 15FR 19 (HEMODIALYSIS SUPPLIES) IMPLANT
CATH CANNON HEMO 15FR 23CM (HEMODIALYSIS SUPPLIES) ×4 IMPLANT
CATH CANNON HEMO 15FR 31CM (HEMODIALYSIS SUPPLIES) IMPLANT
CATH CANNON HEMO 15FR 32CM (HEMODIALYSIS SUPPLIES) IMPLANT
CLIP LIGATING EXTRA MED SLVR (CLIP) ×4 IMPLANT
CLIP LIGATING EXTRA SM BLUE (MISCELLANEOUS) ×4 IMPLANT
CLOSURE STERI-STRIP 1/2X4 (GAUZE/BANDAGES/DRESSINGS) ×1
CLOSURE WOUND 1/2 X4 (GAUZE/BANDAGES/DRESSINGS) ×1
CLSR STERI-STRIP ANTIMIC 1/2X4 (GAUZE/BANDAGES/DRESSINGS) ×3 IMPLANT
COVER DOME SNAP 22 D (MISCELLANEOUS) ×4 IMPLANT
COVER PROBE W GEL 5X96 (DRAPES) ×4 IMPLANT
DECANTER SPIKE VIAL GLASS SM (MISCELLANEOUS) ×4 IMPLANT
DRAPE C-ARM 42X72 X-RAY (DRAPES) IMPLANT
DRAPE CHEST BREAST 15X10 FENES (DRAPES) ×4 IMPLANT
ELECT REM PT RETURN 9FT ADLT (ELECTROSURGICAL) ×4
ELECTRODE REM PT RTRN 9FT ADLT (ELECTROSURGICAL) ×2 IMPLANT
GAUZE SPONGE 2X2 8PLY STRL LF (GAUZE/BANDAGES/DRESSINGS) ×2 IMPLANT
GAUZE SPONGE 4X4 12PLY STRL (GAUZE/BANDAGES/DRESSINGS) ×4 IMPLANT
GAUZE SPONGE 4X4 16PLY XRAY LF (GAUZE/BANDAGES/DRESSINGS) IMPLANT
GEL ULTRASOUND 20GR AQUASONIC (MISCELLANEOUS) IMPLANT
GLOVE BIO SURGEON STRL SZ 6.5 (GLOVE) ×6 IMPLANT
GLOVE BIO SURGEONS STRL SZ 6.5 (GLOVE) ×2
GLOVE BIOGEL PI IND STRL 6.5 (GLOVE) ×8 IMPLANT
GLOVE BIOGEL PI INDICATOR 6.5 (GLOVE) ×8
GLOVE SS BIOGEL STRL SZ 7.5 (GLOVE) ×4 IMPLANT
GLOVE SUPERSENSE BIOGEL SZ 7.5 (GLOVE) ×4
GLOVE SURG SS PI 6.5 STRL IVOR (GLOVE) ×4 IMPLANT
GOWN STRL REUS W/ TWL LRG LVL3 (GOWN DISPOSABLE) ×10 IMPLANT
GOWN STRL REUS W/TWL LRG LVL3 (GOWN DISPOSABLE) ×10
KIT BASIN OR (CUSTOM PROCEDURE TRAY) ×4 IMPLANT
KIT ROOM TURNOVER OR (KITS) ×4 IMPLANT
NEEDLE 18GX1X1/2 (RX/OR ONLY) (NEEDLE) ×4 IMPLANT
NEEDLE 22X1 1/2 (OR ONLY) (NEEDLE) ×4 IMPLANT
NEEDLE HYPO 25GX1X1/2 BEV (NEEDLE) ×4 IMPLANT
NS IRRIG 1000ML POUR BTL (IV SOLUTION) ×4 IMPLANT
PACK CV ACCESS (CUSTOM PROCEDURE TRAY) ×4 IMPLANT
PACK SURGICAL SETUP 50X90 (CUSTOM PROCEDURE TRAY) ×4 IMPLANT
PAD ARMBOARD 7.5X6 YLW CONV (MISCELLANEOUS) ×8 IMPLANT
SOAP 2 % CHG 4 OZ (WOUND CARE) ×4 IMPLANT
SPONGE GAUZE 2X2 STER 10/PKG (GAUZE/BANDAGES/DRESSINGS) ×2
SPONGE GAUZE 4X4 12PLY STER LF (GAUZE/BANDAGES/DRESSINGS) ×4 IMPLANT
STRIP CLOSURE SKIN 1/2X4 (GAUZE/BANDAGES/DRESSINGS) ×3 IMPLANT
SUT ETHILON 3 0 PS 1 (SUTURE) ×4 IMPLANT
SUT PROLENE 6 0 CC (SUTURE) ×4 IMPLANT
SUT SILK 2 0 SH (SUTURE) ×4 IMPLANT
SUT VIC AB 3-0 SH 27 (SUTURE) ×4
SUT VIC AB 3-0 SH 27X BRD (SUTURE) ×4 IMPLANT
SUT VICRYL 4-0 PS2 18IN ABS (SUTURE) ×4 IMPLANT
SYR 20CC LL (SYRINGE) IMPLANT
SYR 5ML LL (SYRINGE) ×8 IMPLANT
SYR CONTROL 10ML LL (SYRINGE) IMPLANT
SYRINGE 10CC LL (SYRINGE) ×4 IMPLANT
TAPE CLOTH SURG 4X10 WHT LF (GAUZE/BANDAGES/DRESSINGS) ×4 IMPLANT
UNDERPAD 30X30 INCONTINENT (UNDERPADS AND DIAPERS) ×4 IMPLANT
WATER STERILE IRR 1000ML POUR (IV SOLUTION) ×4 IMPLANT

## 2014-10-30 NOTE — Transfer of Care (Signed)
Immediate Anesthesia Transfer of Care Note  Patient: Ruth Gutierrez  Procedure(s) Performed: Procedure(s): LEFT BASCILIC VEIN TRANSPOSITION VS INSERTION LEFT ARM AVG (Left) INSERTION OF DIALYSIS CATHETER (Right)  Patient Location: PACU  Anesthesia Type:MAC  Level of Consciousness: awake, alert , oriented and patient cooperative  Airway & Oxygen Therapy: Patient Spontanous Breathing and Patient connected to face mask oxygen  Post-op Assessment: Report given to RN, Post -op Vital signs reviewed and stable, Patient moving all extremities and Patient moving all extremities X 4  Post vital signs: Reviewed and stable  Last Vitals:  Filed Vitals:   10/30/14 0720  BP: 149/85  Pulse: 88  Temp: 37.2 C  Resp: 18    Complications: No apparent anesthesia complications

## 2014-10-30 NOTE — Interval H&P Note (Signed)
History and Physical Interval Note:  10/30/2014 7:35 AM  Ruth Gutierrez  has presented today for surgery, with the diagnosis of End Stage Renal Disease  N18.6  The various methods of treatment have been discussed with the patient and family. After consideration of risks, benefits and other options for treatment, the patient has consented to  Procedure(s): LEFT BASCILIC VEIN TRANSPOSITION VS INSERTION LEFT ARM AVG (Left) INSERTION OF DIALYSIS CATHETER (N/A) as a surgical intervention .  The patient's history has been reviewed, patient examined, no change in status, stable for surgery.  I have reviewed the patient's chart and labs.  Questions were answered to the patient's satisfaction.     EARLY, TODD

## 2014-10-30 NOTE — H&P (View-Only) (Signed)
[facility]ascular and Vein Specialist of [facility]  Patient name: Ruth Gutierrez MRN: [id number] DOB: 03/30/1966 Sex: female  REASON FOR CONSULT: evaluate for hemodialysis access. Referred by Dr. Jimmy Footman  HPI: Ruth Gutierrez is a 49 y.o. female who is not yet on dialysis. She had a kidney transplant which ultimately failed. Previously she had a left brachiocephalic AV fistula which is not functional. This was done 5 or 6 years ago. She has had some fatigue but attributes this to her anemia. She denies any other uremic symptoms including nausea, vomiting, anorexia, or palpitations. She is right-handed.  I have reviewed her records from [facility]. She has advanced stage IV chronic kidney disease related to hypertension. She had previously been on dialysis for about 2 years before her transplant.  I reviewed her most recent labs which show a GFR of 9 on 10/19/2014. Hemoglobin was 8.1. She does also have a history of secondary hyperparathyroidism and anemia of chronic disease.   Past Medical History  Diagnosis Date  . Deceased-donor kidney transplant     Performed at [facility], April 2010.  Initial ESRD due to HTN nephropathy  . Anemia of chronic disease   . ESRD (end stage renal disease)     s/p transplant creatinine baseline 1.1  . History of hyperparathyroidism   . Hypertension   . Shortness of breath   . GERD (gastroesophageal reflux disease)   . Headache(784.0)   . Arthritis    Family History  Problem Relation Age of Onset  . Diabetes Brother   . Deep vein thrombosis Brother   . Hypertension Mother   . Hypertension Sister   . Hyperlipidemia Sister   . Hypertension Father   . Kidney disease Brother     on HD   SOCIAL HISTORY: History  Substance Use Topics  . Smoking status: Current Every Day Smoker -- 0.50 packs/day for 27 years    Types: Cigarettes  . Smokeless tobacco: Never Used     Comment: 10 cigarettes a day  . Alcohol Use: No     Comment:  occasional drinker noted in the past   Allergies  Allergen Reactions  . Penicillins Other (See Comments)    Itchiness   Current Outpatient Prescriptions  Medication Sig Dispense Refill  . acetaminophen-codeine (TYLENOL #3) 300-30 MG per tablet Take 1-2 tablets by mouth every 6 (six) hours as needed for moderate pain. (Patient not taking: Reported on 09/07/2014) 15 tablet 0  . amLODipine (NORVASC) 10 MG tablet Take 10 mg by mouth daily.     Marland Kitchen aspirin 81 MG tablet Take 81 mg by mouth daily.     Marland Kitchen atenolol (TENORMIN) 50 MG tablet Take 50 mg by mouth 2 (two) times daily.     . calcitRIOL (ROCALTROL) 0.5 MCG capsule Take 0.5 mcg by mouth daily.    . cephALEXin (KEFLEX) 250 MG capsule Take 250 mg by mouth daily.    . cephALEXin (KEFLEX) 500 MG capsule Take 1 capsule (500 mg total) by mouth 4 (four) times daily. (Patient not taking: Reported on 09/07/2014) 20 capsule 0  . diphenhydrAMINE (BENADRYL) 25 MG tablet Take 25 mg by mouth every 8 (eight) hours as needed (for cough).    . docusate sodium (COLACE) 100 MG capsule Take 100 mg by mouth 2 (two) times daily as needed for constipation.     . ferrous sulfate 325 (65 FE) MG tablet Take 650 mg by mouth 2 (two) times daily with a meal.     .  furosemide (LASIX) 40 MG tablet Take 40 mg by mouth 2 (two) times daily.     Marland Kitchen KLOR-CON M20 20 MEQ tablet Take 20 mEq by mouth 2 (two) times daily.     . mycophenolate (CELLCEPT) 250 MG capsule Take 1,000 mg by mouth 2 (two) times daily. Take 4 capsules by mouth twice daily.    . tacrolimus (PROGRAF) 1 MG capsule Take 9-10 mg by mouth 2 (two) times daily. Take 10 capsules in the morning and takes 9 capsules at night     No current facility-administered medications for this visit.   REVIEW OF SYSTEMS: Valu.Nieves ] denotes positive finding; [  ] denotes negative finding  CARDIOVASCULAR:  [ ]  chest pain   [ ]  chest pressure   [ ]  palpitations   [ ]  orthopnea   [ ]  dyspnea on exertion   [ ]  claudication   [ ]  rest pain   [ ]   DVT   [ ]  phlebitis PULMONARY:   [ ]  productive cough   [ ]  asthma   [ ]  wheezing NEUROLOGIC:   [ ]  weakness  [ ]  paresthesias  [ ]  aphasia  [ ]  amaurosis  [ ]  dizziness HEMATOLOGIC:   [ ]  bleeding problems   [ ]  clotting disorders MUSCULOSKELETAL:  [ ]  joint pain   [ ]  joint swelling [ ]  leg swelling GASTROINTESTINAL: [ ]   blood in stool  [ ]   hematemesis GENITOURINARY:  [ ]   dysuria  [ ]   hematuria PSYCHIATRIC:  [ ]  history of major depression INTEGUMENTARY:  [ ]  rashes  [ ]  ulcers CONSTITUTIONAL:  [ ]  fever   [ ]  chills  PHYSICAL EXAM: Filed Vitals:   10/25/14 1514  BP: 128/88  Pulse: 81  Height: 5\' 7"  (1.702 m)  Weight: 177 lb 14.4 oz (80.695 kg)  SpO2: 100%   Body mass index is 27.86 kg/(m^2). GENERAL: The patient is a well-nourished female, in no acute distress. The vital signs are documented above. CARDIOVASCULAR: There is a regular rate and rhythm. I do not detect carotid bruits. She has palpable radial pulses. PULMONARY: There is good air exchange bilaterally without wheezing or rales. ABDOMEN: Soft and non-tender with normal pitched bowel sounds.  MUSCULOSKELETAL: There are no major deformities or cyanosis. NEUROLOGIC: No focal weakness or paresthesias are detected. SKIN: There are no ulcers or rashes noted. PSYCHIATRIC: The patient has a normal affect.  DATA:  I have independently interpreted her vein mapping which shows that her forearm and upper arm cephalic vein on the right are very small and did not appear to be adequate for a fistula. The basilic vein is also quite small on the right. The only potential option on the left for a fistula would be a basilic vein transposition in the vein here is marginal in size.  I have independently interpreted her arterial Doppler study today which shows triphasic Doppler signals in the radial and ulnar positions bilaterally.  MEDICAL ISSUES:  STAGE IV CHRONIC KIDNEY DISEASE: I discussed the patient today over the phone with Dr.  Jimmy Footman. He would like new access and a catheter placed in the next week. I think that she could potentially have a basilic vein transposition on the left otherwise she would require an AV graft. She feels strongly about not having an AV graft but after extensive discussion she states that she would be agreeable to have a graft if a fistula were not possible. I explained that we would certainly give her the  benefit of the doubt if the vein looked at all reasonable. I have discussed the indications for the procedure and the potential complications and she is agreeable to proceed. In addition I discussed the indications for placement of a tunneled dialysis catheter and the potential complications. All her questions were answered and she is agreeable to proceed.   [place] [facility] [place] [facility]: [phone]

## 2014-10-30 NOTE — Anesthesia Preprocedure Evaluation (Addendum)
Anesthesia Evaluation  Patient identified by MRN, date of birth, ID band Patient awake    Reviewed: Allergy & Precautions, NPO status , Patient's Chart, lab work & pertinent test results  Airway Mallampati: II  TM Distance: >3 FB Neck ROM: Full    Dental no notable dental hx.    Pulmonary neg pulmonary ROS, shortness of breath, Current Smoker,  breath sounds clear to auscultation  Pulmonary exam normal       Cardiovascular hypertension, Pt. on home beta blockers and Pt. on medications Rhythm:Regular Rate:Normal     Neuro/Psych  Headaches, negative psych ROS   GI/Hepatic Neg liver ROS, GERD-  ,  Endo/Other  negative endocrine ROSdiabetes, Type 2, Oral Hypoglycemic Agents  Renal/GU ESRFRenal disease     Musculoskeletal negative musculoskeletal ROS (+) Arthritis -,   Abdominal   Peds  Hematology  (+) anemia ,   Anesthesia Other Findings   Reproductive/Obstetrics negative OB ROS                            Anesthesia Physical Anesthesia Plan  ASA: III  Anesthesia Plan: MAC   Post-op Pain Management:    Induction:   Airway Management Planned: Natural Airway, Nasal Cannula and Simple Face Mask  Additional Equipment: None  Intra-op Plan:   Post-operative Plan:   Informed Consent: I have reviewed the patients History and Physical, chart, labs and discussed the procedure including the risks, benefits and alternatives for the proposed anesthesia with the patient or authorized representative who has indicated his/her understanding and acceptance.   Dental advisory given  Plan Discussed with: CRNA  Anesthesia Plan Comments:        Anesthesia Quick Evaluation

## 2014-10-30 NOTE — Anesthesia Postprocedure Evaluation (Signed)
Anesthesia Post Note  Patient: Ruth Gutierrez  Procedure(s) Performed: Procedure(s) (LRB): LEFT BASCILIC VEIN TRANSPOSITION VS INSERTION LEFT ARM AVG (Left) INSERTION OF DIALYSIS CATHETER (Right)  Anesthesia type: MAC  Patient location: PACU  Post pain: Pain level controlled  Post assessment: Post-op Vital signs reviewed  Last Vitals: BP 129/73 mmHg  Pulse 78  Temp(Src) 37 C (Oral)  Resp 20  Wt 177 lb (80.287 kg)  SpO2 100%  LMP 10/20/2014  Post vital signs: Reviewed  Level of consciousness: awake  Complications: No apparent anesthesia complications

## 2014-10-30 NOTE — Discharge Instructions (Signed)
° ° °  10/30/2014 Ruth Gutierrez [id number] 1966/05/15  Surgeon(s): Rosetta Posner, MD  Procedure(s): LEFT BASCILIC VEIN TRANSPOSITION VS INSERTION LEFT ARM AVG INSERTION OF DIALYSIS CATHETER  x Do not stick fistula for 12 weeks

## 2014-10-30 NOTE — Op Note (Signed)
    OPERATIVE REPORT  DATE OF SURGERY: 10/30/2014  PATIENT: Ruth Gutierrez, 49 y.o. female MRN: [id number]  DOB: 1966/07/12  PRE-OPERATIVE DIAGNOSIS: End-stage renal disease  POST-OPERATIVE DIAGNOSIS:  Same  PROCEDURE: #1 right IJ hemodialysis catheter with SonoSite visualization, #2 left first stage basilic vein fistula  SURGEON:  Curt Jews, M.D.  PHYSICIAN ASSISTANT: Samantha Rhyne PA-C  ANESTHESIA:  Local with sedation  EBL: Minimal ml  Total I/O In: 300 [I.V.:300] Out: 50 [Blood:50]  BLOOD ADMINISTERED: None  DRAINS: None  SPECIMEN: None  COUNTS CORRECT:  YES  PLAN OF CARE: PACU stable with chest x-ray pending   PATIENT DISPOSITION:  PACU - hemodynamically stable  PROCEDURE DETAILS: Patient was taken up room placed supine position where the area of the left and right neck and chest were prepped and draped in usual sterile fashion. Using SonoSite visualization and a singlewall puncture of the right internal jugular vein was accessed and a guidewire was passed down the level right atrium. This was confirmed with fluoroscopy. A dilator was passed over the guidewire and the dilator and peel-away sheath were passed over the guidewire. The dilator and guidewire removed. A 23 cm tunneled catheter was placed through the peel-away sheath was removed. The catheter tips were positioned the level of distal right atrium. The catheter was brought through a subcutaneous tunnel through a separate stab incision. The 2 lm ports were attached in both lumens flushed and aspirated easily and were locked with 1000 unit per cc heparin. The catheter was secured to the skin with a 3-0 nylon stitch and the entry site was closed with a 4-0 subcuticular Vicryl suture. She was applied.  Next attention was turned to the left arm. SonoSite ultrasound was used to visualize the basilic vein which was of moderate size. The left arm prepped and draped in sterile fashion. Incision was made over the  prior brachial artery cephalic vein anastomosis. The patient had an old nonfunctional cephalic vein fistula. 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 This was exposed and the brachial artery was exposed proximal to schedule this. The vein was exposed through a separate incision on the medial aspect of the antecubital area. Separate review of good caliber. Separate incision was made further distally only upper forearm on the medial aspect of the basilic vein as well. The basilic vein was ligated through this more distal incision and tributary branches were ligated with 3 or 4 silk ties and divided. The vein was brought through a tunnel from the site of the basilic vein up to the brachial artery. Care was taken not to twist the vein. The brachial artery was occluded above and below the old the venous anastomosis. The venous aneurysm was excised. The basilic vein was spatulated and sewn end-to-side to the brachial artery at the prior site of the prior cephalic vein fistula. Clamps removed and excellent thrill was noted. The wound irrigated with saline. Hemostasis was tablet cautery. Wounds were closed with 3-0 Vicryl in the subcutaneous and subcuticular tissue. Benzoin Steri-Strips were applied.   Curt Jews, M.D. 10/30/2014 12:06 PM

## 2014-10-31 ENCOUNTER — Telehealth: Payer: Self-pay | Admitting: Vascular Surgery

## 2014-10-31 ENCOUNTER — Encounter (HOSPITAL_COMMUNITY): Payer: Self-pay | Admitting: Vascular Surgery

## 2014-10-31 NOTE — Telephone Encounter (Addendum)
-----   Message from Gabriel Earing, [place] sent at 10/30/2014 11:32 AM EDT ----- S/p left 1st stage BVT 10/30/14.  F/u with Dr. Donnetta Hutching in 4 weeks with duplex.  Thanks, Aldona Bar  10/31/14: tried to reach pt x 2- it sounds like someone picks up the phone, but nothing is ever said. I have mailed a letter regarding the appointment, dpm

## 2014-12-11 ENCOUNTER — Other Ambulatory Visit: Payer: Self-pay

## 2014-12-11 DIAGNOSIS — N186 End stage renal disease: Secondary | ICD-10-CM

## 2014-12-11 DIAGNOSIS — Z0279 Encounter for issue of other medical certificate: Secondary | ICD-10-CM | POA: Diagnosis not present

## 2014-12-11 DIAGNOSIS — Z992 Dependence on renal dialysis: Principal | ICD-10-CM

## 2014-12-19 ENCOUNTER — Encounter: Payer: BLUE CROSS/BLUE SHIELD | Admitting: Vascular Surgery

## 2014-12-19 ENCOUNTER — Ambulatory Visit (HOSPITAL_COMMUNITY)
Admission: RE | Admit: 2014-12-19 | Discharge: 2014-12-19 | Disposition: A | Discharge status: Home / Self Care | Payer: BLUE CROSS/BLUE SHIELD | Source: Ambulatory Visit | Attending: Vascular Surgery | Admitting: Vascular Surgery

## 2014-12-19 ENCOUNTER — Other Ambulatory Visit: Payer: Self-pay | Admitting: Vascular Surgery

## 2014-12-19 DIAGNOSIS — N186 End stage renal disease: Secondary | ICD-10-CM

## 2014-12-19 DIAGNOSIS — Z992 Dependence on renal dialysis: Secondary | ICD-10-CM

## 2014-12-19 DIAGNOSIS — Z4931 Encounter for adequacy testing for hemodialysis: Secondary | ICD-10-CM

## 2014-12-22 ENCOUNTER — Encounter: Payer: Self-pay | Admitting: Vascular Surgery

## 2014-12-22 ENCOUNTER — Encounter: Payer: BLUE CROSS/BLUE SHIELD | Admitting: Vascular Surgery

## 2014-12-26 ENCOUNTER — Encounter: Payer: Self-pay | Admitting: Vascular Surgery

## 2014-12-26 ENCOUNTER — Other Ambulatory Visit: Payer: Self-pay

## 2014-12-26 ENCOUNTER — Other Ambulatory Visit: Payer: Self-pay | Admitting: *Deleted

## 2014-12-26 ENCOUNTER — Ambulatory Visit (INDEPENDENT_AMBULATORY_CARE_PROVIDER_SITE_OTHER): Payer: Self-pay | Admitting: Vascular Surgery

## 2014-12-26 VITALS — BP 113/80 | HR 80 | Resp 18 | Ht 66.5 in | Wt 180.5 lb

## 2014-12-26 DIAGNOSIS — N186 End stage renal disease: Secondary | ICD-10-CM

## 2014-12-26 NOTE — Progress Notes (Signed)
Patient resents today for follow-up of an IJ hemodialysis catheter placement and left first stage basilic vein fistula. Surgery was on 10/30/2014. She reports no difficulty with the catheter.  On physical exam her left antecubital incision is healed quite nicely. I did image her fistula with SonoSite. She has good maturation of the basilic vein throughout her arm. The basilic vein joins the brachial vein in mid upper arm and is patent up to the axilla.  Impression and plan: Successful first stage basilic vein fistula. Have recommended transposition as a second stage. Will schedule this at her earliest convenience

## 2015-01-02 ENCOUNTER — Encounter (HOSPITAL_COMMUNITY): Payer: Self-pay | Admitting: *Deleted

## 2015-01-02 MED ORDER — VANCOMYCIN HCL IN DEXTROSE 1-5 GM/200ML-% IV SOLN
1000.0000 mg | INTRAVENOUS | Status: AC
  Administered 2015-01-03: 1000 mg via INTRAVENOUS
  Filled 2015-01-02: qty 200

## 2015-01-02 MED ORDER — SODIUM CHLORIDE 0.9 % IV SOLN
INTRAVENOUS | Status: DC
  Administered 2015-01-03: 10:00:00 via INTRAVENOUS

## 2015-01-03 ENCOUNTER — Ambulatory Visit (HOSPITAL_COMMUNITY)
Admission: RE | Admit: 2015-01-03 | Discharge: 2015-01-03 | Disposition: A | Discharge status: Home / Self Care | Payer: BLUE CROSS/BLUE SHIELD | Source: Ambulatory Visit | Attending: Vascular Surgery | Admitting: Vascular Surgery

## 2015-01-03 ENCOUNTER — Ambulatory Visit (HOSPITAL_COMMUNITY): Payer: BLUE CROSS/BLUE SHIELD | Admitting: Anesthesiology

## 2015-01-03 ENCOUNTER — Encounter (HOSPITAL_COMMUNITY): Payer: Self-pay | Admitting: *Deleted

## 2015-01-03 ENCOUNTER — Encounter (HOSPITAL_COMMUNITY)
Admission: RE | Disposition: A | Discharge status: Home / Self Care | Payer: Self-pay | Source: Ambulatory Visit | Attending: Vascular Surgery

## 2015-01-03 DIAGNOSIS — N186 End stage renal disease: Secondary | ICD-10-CM | POA: Diagnosis present

## 2015-01-03 DIAGNOSIS — K219 Gastro-esophageal reflux disease without esophagitis: Secondary | ICD-10-CM | POA: Diagnosis not present

## 2015-01-03 DIAGNOSIS — I120 Hypertensive chronic kidney disease with stage 5 chronic kidney disease or end stage renal disease: Secondary | ICD-10-CM | POA: Diagnosis not present

## 2015-01-03 DIAGNOSIS — F17200 Nicotine dependence, unspecified, uncomplicated: Secondary | ICD-10-CM | POA: Diagnosis not present

## 2015-01-03 DIAGNOSIS — Z79899 Other long term (current) drug therapy: Secondary | ICD-10-CM | POA: Diagnosis not present

## 2015-01-03 DIAGNOSIS — Z940 Kidney transplant status: Secondary | ICD-10-CM | POA: Diagnosis not present

## 2015-01-03 DIAGNOSIS — J449 Chronic obstructive pulmonary disease, unspecified: Secondary | ICD-10-CM | POA: Diagnosis not present

## 2015-01-03 DIAGNOSIS — M1990 Unspecified osteoarthritis, unspecified site: Secondary | ICD-10-CM | POA: Insufficient documentation

## 2015-01-03 HISTORY — PX: BASCILIC VEIN TRANSPOSITION: SHX5742

## 2015-01-03 LAB — POCT I-STAT 4, (NA,K, GLUC, HGB,HCT)
Glucose, Bld: 82 mg/dL (ref 65–99)
HEMATOCRIT: 43 % (ref 36.0–46.0)
Hemoglobin: 14.6 g/dL (ref 12.0–15.0)
Potassium: 3.4 mmol/L — ABNORMAL LOW (ref 3.5–5.1)
SODIUM: 140 mmol/L (ref 135–145)

## 2015-01-03 LAB — HCG, SERUM, QUALITATIVE: PREG SERUM: NEGATIVE

## 2015-01-03 SURGERY — TRANSPOSITION, VEIN, BASILIC
Anesthesia: General | Site: Arm Upper | Laterality: Left

## 2015-01-03 MED ORDER — LIDOCAINE HCL (CARDIAC) 20 MG/ML IV SOLN
INTRAVENOUS | Status: AC
  Filled 2015-01-03: qty 5

## 2015-01-03 MED ORDER — EPHEDRINE SULFATE 50 MG/ML IJ SOLN
INTRAMUSCULAR | Status: AC
  Filled 2015-01-03: qty 1

## 2015-01-03 MED ORDER — ARTIFICIAL TEARS OP OINT
TOPICAL_OINTMENT | OPHTHALMIC | Status: AC
  Filled 2015-01-03: qty 3.5

## 2015-01-03 MED ORDER — MIDAZOLAM HCL 5 MG/5ML IJ SOLN
INTRAMUSCULAR | Status: DC | PRN
  Administered 2015-01-03: 2 mg via INTRAVENOUS

## 2015-01-03 MED ORDER — FENTANYL CITRATE (PF) 250 MCG/5ML IJ SOLN
INTRAMUSCULAR | Status: AC
  Filled 2015-01-03: qty 5

## 2015-01-03 MED ORDER — EPHEDRINE SULFATE 50 MG/ML IJ SOLN
INTRAMUSCULAR | Status: DC | PRN
  Administered 2015-01-03 (×3): 10 mg via INTRAVENOUS

## 2015-01-03 MED ORDER — FENTANYL CITRATE (PF) 100 MCG/2ML IJ SOLN
INTRAMUSCULAR | Status: DC | PRN
  Administered 2015-01-03: 25 ug via INTRAVENOUS
  Administered 2015-01-03 (×3): 50 ug via INTRAVENOUS
  Administered 2015-01-03: 25 ug via INTRAVENOUS
  Administered 2015-01-03: 50 ug via INTRAVENOUS

## 2015-01-03 MED ORDER — ONDANSETRON HCL 4 MG/2ML IJ SOLN
INTRAMUSCULAR | Status: DC | PRN
  Administered 2015-01-03: 4 mg via INTRAVENOUS

## 2015-01-03 MED ORDER — PROMETHAZINE HCL 25 MG/ML IJ SOLN: 6.2500 mg | INTRAMUSCULAR | Status: DC | PRN

## 2015-01-03 MED ORDER — PHENYLEPHRINE HCL 10 MG/ML IJ SOLN
INTRAMUSCULAR | Status: DC | PRN
  Administered 2015-01-03: 80 ug via INTRAVENOUS

## 2015-01-03 MED ORDER — STERILE WATER FOR INJECTION IJ SOLN
INTRAMUSCULAR | Status: AC
  Filled 2015-01-03: qty 10

## 2015-01-03 MED ORDER — OXYCODONE-ACETAMINOPHEN 5-325 MG PO TABS
1.0000 | ORAL_TABLET | ORAL | Status: AC
  Administered 2015-01-03: 1 via ORAL

## 2015-01-03 MED ORDER — MIDAZOLAM HCL 2 MG/2ML IJ SOLN
0.5000 mg | Freq: Once | INTRAMUSCULAR | Status: DC | PRN
Start: 2015-01-03 — End: 2015-01-03

## 2015-01-03 MED ORDER — FENTANYL CITRATE (PF) 100 MCG/2ML IJ SOLN
INTRAMUSCULAR | Status: AC
  Filled 2015-01-03: qty 2

## 2015-01-03 MED ORDER — CHLORHEXIDINE GLUCONATE CLOTH 2 % EX PADS: 6.0000 | MEDICATED_PAD | Freq: Once | CUTANEOUS | Status: DC

## 2015-01-03 MED ORDER — DEXTROSE 5 % IV SOLN
INTRAVENOUS | Status: DC | PRN
  Administered 2015-01-03: 11:00:00 via INTRAVENOUS

## 2015-01-03 MED ORDER — ONDANSETRON HCL 4 MG/2ML IJ SOLN
INTRAMUSCULAR | Status: AC
  Filled 2015-01-03: qty 2

## 2015-01-03 MED ORDER — PROPOFOL 10 MG/ML IV BOLUS
INTRAVENOUS | Status: DC | PRN
  Administered 2015-01-03: 50 mg via INTRAVENOUS
  Administered 2015-01-03 (×2): 30 mg via INTRAVENOUS
  Administered 2015-01-03: 50 mg via INTRAVENOUS
  Administered 2015-01-03: 100 mg via INTRAVENOUS

## 2015-01-03 MED ORDER — OXYCODONE-ACETAMINOPHEN 5-325 MG PO TABS
ORAL_TABLET | ORAL | Status: AC
  Filled 2015-01-03: qty 1

## 2015-01-03 MED ORDER — LIDOCAINE-EPINEPHRINE 0.5 %-1:200000 IJ SOLN
INTRAMUSCULAR | Status: AC
  Filled 2015-01-03: qty 1

## 2015-01-03 MED ORDER — LIDOCAINE HCL (CARDIAC) 20 MG/ML IV SOLN
INTRAVENOUS | Status: DC | PRN
  Administered 2015-01-03: 40 mg via INTRAVENOUS
  Administered 2015-01-03: 60 mg via INTRAVENOUS

## 2015-01-03 MED ORDER — SODIUM CHLORIDE 0.9 % IR SOLN
Status: DC | PRN
  Administered 2015-01-03: 500 mL

## 2015-01-03 MED ORDER — SUCCINYLCHOLINE CHLORIDE 20 MG/ML IJ SOLN
INTRAMUSCULAR | Status: AC
  Filled 2015-01-03: qty 1

## 2015-01-03 MED ORDER — GLYCOPYRROLATE 0.2 MG/ML IJ SOLN
INTRAMUSCULAR | Status: AC
  Filled 2015-01-03: qty 1

## 2015-01-03 MED ORDER — ARTIFICIAL TEARS OP OINT
TOPICAL_OINTMENT | OPHTHALMIC | Status: DC | PRN
  Administered 2015-01-03: 1 via OPHTHALMIC

## 2015-01-03 MED ORDER — ROCURONIUM BROMIDE 50 MG/5ML IV SOLN
INTRAVENOUS | Status: AC
  Filled 2015-01-03: qty 1

## 2015-01-03 MED ORDER — PROPOFOL 10 MG/ML IV BOLUS
INTRAVENOUS | Status: AC
  Filled 2015-01-03: qty 20

## 2015-01-03 MED ORDER — 0.9 % SODIUM CHLORIDE (POUR BTL) OPTIME
TOPICAL | Status: DC | PRN
  Administered 2015-01-03: 1000 mL

## 2015-01-03 MED ORDER — FENTANYL CITRATE (PF) 100 MCG/2ML IJ SOLN
25.0000 ug | INTRAMUSCULAR | Status: DC | PRN
  Administered 2015-01-03: 25 ug via INTRAVENOUS
  Administered 2015-01-03: 50 ug via INTRAVENOUS
  Administered 2015-01-03: 25 ug via INTRAVENOUS

## 2015-01-03 MED ORDER — MEPERIDINE HCL 25 MG/ML IJ SOLN: 6.2500 mg | INTRAMUSCULAR | Status: DC | PRN

## 2015-01-03 MED ORDER — OXYCODONE-ACETAMINOPHEN 5-325 MG PO TABS: 1.0000 | ORAL_TABLET | Freq: Four times a day (QID) | ORAL | Status: DC | PRN

## 2015-01-03 MED ORDER — MIDAZOLAM HCL 2 MG/2ML IJ SOLN
INTRAMUSCULAR | Status: AC
  Filled 2015-01-03: qty 2

## 2015-01-03 SURGICAL SUPPLY — 35 items
BENZOIN TINCTURE PRP APPL 2/3 (GAUZE/BANDAGES/DRESSINGS) ×3 IMPLANT
CANISTER SUCTION 2500CC (MISCELLANEOUS) ×3 IMPLANT
CANNULA VESSEL 3MM 2 BLNT TIP (CANNULA) ×3 IMPLANT
CLIP LIGATING EXTRA MED SLVR (CLIP) ×3 IMPLANT
CLIP LIGATING EXTRA SM BLUE (MISCELLANEOUS) ×3 IMPLANT
CLOSURE WOUND 1/2 X4 (GAUZE/BANDAGES/DRESSINGS) ×1
COVER PROBE W GEL 5X96 (DRAPES) ×3 IMPLANT
DECANTER SPIKE VIAL GLASS SM (MISCELLANEOUS) ×3 IMPLANT
ELECT REM PT RETURN 9FT ADLT (ELECTROSURGICAL) ×3
ELECTRODE REM PT RTRN 9FT ADLT (ELECTROSURGICAL) ×1 IMPLANT
GAUZE SPONGE 4X4 12PLY STRL (GAUZE/BANDAGES/DRESSINGS) ×3 IMPLANT
GEL ULTRASOUND 20GR AQUASONIC (MISCELLANEOUS) ×3 IMPLANT
GLOVE BIO SURGEON STRL SZ 6.5 (GLOVE) ×2 IMPLANT
GLOVE BIO SURGEONS STRL SZ 6.5 (GLOVE) ×1
GLOVE BIOGEL PI IND STRL 6.5 (GLOVE) ×4 IMPLANT
GLOVE BIOGEL PI INDICATOR 6.5 (GLOVE) ×8
GLOVE ECLIPSE 6.5 STRL STRAW (GLOVE) ×3 IMPLANT
GLOVE SS BIOGEL STRL SZ 7.5 (GLOVE) ×1 IMPLANT
GLOVE SUPERSENSE BIOGEL SZ 7.5 (GLOVE) ×2
GOWN STRL REUS W/ TWL LRG LVL3 (GOWN DISPOSABLE) ×3 IMPLANT
GOWN STRL REUS W/TWL LRG LVL3 (GOWN DISPOSABLE) ×6
KIT BASIN OR (CUSTOM PROCEDURE TRAY) ×3 IMPLANT
KIT ROOM TURNOVER OR (KITS) ×3 IMPLANT
NS IRRIG 1000ML POUR BTL (IV SOLUTION) ×3 IMPLANT
PACK CV ACCESS (CUSTOM PROCEDURE TRAY) ×3 IMPLANT
PAD ARMBOARD 7.5X6 YLW CONV (MISCELLANEOUS) ×6 IMPLANT
SPONGE GAUZE 4X4 12PLY STER LF (GAUZE/BANDAGES/DRESSINGS) ×3 IMPLANT
STRIP CLOSURE SKIN 1/2X4 (GAUZE/BANDAGES/DRESSINGS) ×2 IMPLANT
SUT PROLENE 6 0 CC (SUTURE) ×3 IMPLANT
SUT SILK 2 0 SH (SUTURE) IMPLANT
SUT VIC AB 3-0 SH 27 (SUTURE) ×2
SUT VIC AB 3-0 SH 27X BRD (SUTURE) ×1 IMPLANT
TAPE CLOTH SURG 4X10 WHT LF (GAUZE/BANDAGES/DRESSINGS) ×3 IMPLANT
UNDERPAD 30X30 INCONTINENT (UNDERPADS AND DIAPERS) ×3 IMPLANT
WATER STERILE IRR 1000ML POUR (IV SOLUTION) ×3 IMPLANT

## 2015-01-03 NOTE — Anesthesia Preprocedure Evaluation (Addendum)
Anesthesia Evaluation  Patient identified by MRN, date of birth, ID band Patient awake    Reviewed: Allergy & Precautions, NPO status , Patient's Chart, lab work & pertinent test results  History of Anesthesia Complications Negative for: history of anesthetic complications  Airway Mallampati: I  TM Distance: >3 FB Neck ROM: Full    Dental  (+) Edentulous Upper, Poor Dentition, Dental Advisory Given   Pulmonary COPDCurrent Smoker,  breath sounds clear to auscultation        Cardiovascular hypertension, Pt. on medications and Pt. on home beta blockers - anginaRhythm:Regular Rate:Normal  '08 ECHO: EF 50-55%, valves OK   Neuro/Psych  Headaches,    GI/Hepatic Neg liver ROS, GERD-  Controlled,  Endo/Other  negative endocrine ROS  Renal/GU Renal InsufficiencyRenal disease (K+ 3.4)S/p renal transplant 2010     Musculoskeletal  (+) Arthritis -, Osteoarthritis,    Abdominal   Peds  Hematology negative hematology ROS (+)   Anesthesia Other Findings   Reproductive/Obstetrics LMP 12/14/14                           Anesthesia Physical Anesthesia Plan  ASA: III  Anesthesia Plan: General   Post-op Pain Management:    Induction: Intravenous  Airway Management Planned: LMA  Additional Equipment:   Intra-op Plan:   Post-operative Plan:   Informed Consent: I have reviewed the patients History and Physical, chart, labs and discussed the procedure including the risks, benefits and alternatives for the proposed anesthesia with the patient or authorized representative who has indicated his/her understanding and acceptance.   Dental advisory given  Plan Discussed with: CRNA and Surgeon  Anesthesia Plan Comments: (Plan routine monitors, GA- LMA OK)        Anesthesia Quick Evaluation

## 2015-01-03 NOTE — Op Note (Signed)
    OPERATIVE REPORT  DATE OF SURGERY: 01/03/2015  PATIENT: Ruth Gutierrez, 49 y.o. female MRN: [id number]  DOB: Sep 05, 1965  PRE-OPERATIVE DIAGNOSIS: End-stage renal disease  POST-OPERATIVE DIAGNOSIS:  Same  PROCEDURE: Second stage basilic vein transposition  SURGEON:  Curt Jews, M.D.  PHYSICIAN ASSISTANT: Trinh  ANESTHESIA:  Gen.  EBL: Minimal ml  Total I/O In: [id number] [P.O.:120; I.V.:800] Out: 50 [Blood:50]  BLOOD ADMINISTERED: None  DRAINS: None  SPECIMEN: None  COUNTS CORRECT:  YES  PLAN OF CARE: PACU   PATIENT DISPOSITION:  PACU - hemodynamically stable  PROCEDURE DETAILS: The patient was taken to the operating placed supine position where the area of the left arm left jugular prepped and draped in usual sterile fashion. SonoSite ultrasound was used to mark the level of the basilic vein which was a very nice caliber throughout its course. The incision was made over the prior brachial artery anastomosis. Approximately basilic vein to brachial artery anastomosis. Several incisions that were made with skin bridges between the antecubital space and the axilla. Tributary branches of the vein were ligated. The vein was mobilized circumferentially from the axilla to the antecubital space. The vein was occluded at the brachial artery anastomosis is 1 transected. The vein was brought out through the vein harvest tunnel. A subcutaneous tunnel was created from the level of the antecubital space of the brachial artery to the axilla where the basilic vein originated. The basilic vein was gently dilated was of excellent caliber. The vein was marked to prevent twisting and the tunnel. The vein was brought through the subcutaneous tunnel. The brachial artery was occluded proximally and distally and the old vein was opened longitudinally. The vein was spatulated cut to appropriate length and was sewn end-to-side to the brachial artery with a running 6-0 chromic suture. Clamps were removed  and excellent thrill was noted. The wound irrigated with saline. Hemostasis tablet cautery. Wounds were closed with 3-0 Monocryl subcutaneous and subcuticular tissue. Benzoin insertion for applied   Curt Jews, M.D. 01/03/2015 6:38 PM

## 2015-01-03 NOTE — Anesthesia Postprocedure Evaluation (Signed)
  Anesthesia Post-op Note  Patient: Ruth Gutierrez  Procedure(s) Performed: Procedure(s): LEFT ARM 2ND STAGE BASCILIC VEIN TRANSPOSITION (Left)  Patient Location: PACU  Anesthesia Type:General  Level of Consciousness: awake, alert , oriented and patient cooperative  Airway and Oxygen Therapy: Patient Spontanous Breathing  Post-op Pain: none  Post-op Assessment: Post-op Vital signs reviewed, Patient's Cardiovascular Status Stable, Respiratory Function Stable, Patent Airway, No signs of Nausea or vomiting and Pain level controlled  Post-op Vital Signs: Reviewed and stable  Last Vitals:  Filed Vitals:   01/03/15 1431  BP: 124/79  Pulse: 72  Temp:   Resp: 14    Complications: No apparent anesthesia complications

## 2015-01-03 NOTE — H&P (View-Only) (Signed)
Patient resents today for follow-up of an IJ hemodialysis catheter placement and left first stage basilic vein fistula. Surgery was on 10/30/2014. She reports no difficulty with the catheter.  On physical exam her left antecubital incision is healed quite nicely. I did image her fistula with SonoSite. She has good maturation of the basilic vein throughout her arm. The basilic vein joins the brachial vein in mid upper arm and is patent up to the axilla.  Impression and plan: Successful first stage basilic vein fistula. Have recommended transposition as a second stage. Will schedule this at her earliest convenience

## 2015-01-03 NOTE — Anesthesia Procedure Notes (Signed)
Procedure Name: LMA Insertion Date/Time: 01/03/2015 11:15 AM Performed by: Scheryl Darter Pre-anesthesia Checklist: Patient identified, Emergency Drugs available, Suction available, Patient being monitored and Timeout performed Patient Re-evaluated:Patient Re-evaluated prior to inductionOxygen Delivery Method: Circle system utilized Preoxygenation: Pre-oxygenation with 100% oxygen Intubation Type: IV induction Ventilation: Mask ventilation without difficulty LMA: LMA inserted LMA Size: 4.0 Number of attempts: 1 Placement Confirmation: positive ETCO2 and breath sounds checked- equal and bilateral Tube secured with: Tape

## 2015-01-03 NOTE — Transfer of Care (Signed)
Immediate Anesthesia Transfer of Care Note  Patient: Ruth Gutierrez  Procedure(s) Performed: Procedure(s): LEFT ARM 2ND STAGE BASCILIC VEIN TRANSPOSITION (Left)  Patient Location: PACU  Anesthesia Type:General  Level of Consciousness: awake, alert , oriented and sedated  Airway & Oxygen Therapy: Patient Spontanous Breathing and Patient connected to nasal cannula oxygen  Post-op Assessment: Report given to RN, Post -op Vital signs reviewed and stable and Patient moving all extremities  Post vital signs: Reviewed and stable  Last Vitals:  Filed Vitals:   01/03/15 1321  BP:   Pulse: 79  Temp:   Resp: 19    Complications: No apparent anesthesia complications

## 2015-01-03 NOTE — Interval H&P Note (Signed)
History and Physical Interval Note:  01/03/2015 8:39 AM  Ruth Gutierrez  has presented today for surgery, with the diagnosis of End Stage Renal Disease  N18.6  The various methods of treatment have been discussed with the patient and family. After consideration of risks, benefits and other options for treatment, the patient has consented to  Procedure(s): LEFT 2ND STAGE [place] (Left) as a surgical intervention .  The patient's history has been reviewed, patient examined, no change in status, stable for surgery.  I have reviewed the patient's chart and labs.  Questions were answered to the patient's satisfaction.     Curt Jews

## 2015-01-04 ENCOUNTER — Encounter (HOSPITAL_COMMUNITY): Payer: Self-pay | Admitting: Vascular Surgery

## 2015-01-17 ENCOUNTER — Other Ambulatory Visit: Payer: Self-pay | Admitting: Vascular Surgery

## 2015-02-28 DIAGNOSIS — E1129 Type 2 diabetes mellitus with other diabetic kidney complication: Secondary | ICD-10-CM | POA: Diagnosis not present

## 2015-04-27 ENCOUNTER — Other Ambulatory Visit (HOSPITAL_COMMUNITY): Payer: Self-pay | Admitting: *Deleted

## 2015-04-30 DIAGNOSIS — T8612 Kidney transplant failure: Secondary | ICD-10-CM | POA: Insufficient documentation

## 2015-04-30 DIAGNOSIS — D649 Anemia, unspecified: Secondary | ICD-10-CM | POA: Insufficient documentation

## 2015-04-30 LAB — PREPARE RBC (CROSSMATCH)

## 2015-04-30 MED ORDER — SODIUM CHLORIDE 0.9 % IV SOLN: Freq: Once | INTRAVENOUS | Status: DC

## 2015-04-30 MED ORDER — DIPHENHYDRAMINE HCL 25 MG PO CAPS: 25.0000 mg | ORAL_CAPSULE | Freq: Once | ORAL | Status: DC

## 2015-04-30 MED ORDER — ACETAMINOPHEN 325 MG PO TABS: 325.0000 mg | ORAL_TABLET | Freq: Once | ORAL | Status: DC

## 2015-04-30 NOTE — Progress Notes (Signed)
PT here today for type and cross and 2 units of PRBC.  Pt stated to [facility], RN,  that she could not stay with [place] today because she has dialysis at 12:00.  We had no openings tomorrow and she can not come in on Wednesday because of dialysis as well.  We scheduled her to come in on Thursday at 8:00am and told her if she started to feel bad between now and then to go to the emergency room.  Pt verbalized understanding.  We drew her type and cross match today. I called [facility] and reported the above however had to leave a voicemail with Gustavus Bryant, CMA and am awaiting a return call.  Upon speaking to the blood bank this morning they reported that in 2012 the patient refused blood products.  I spoke with the patient and told her that we were aware that in 2012 she refused blood products and I asked her if she was now okay with receiving blood and she stated she was ok receiving blood.  We had her sign her blood consent and sent a copy of the consent down to the blood bank per their request.

## 2015-04-30 NOTE — Progress Notes (Signed)
Crystal called back from [place] [facility] and stated to call the [place] [facility] about this patient.  I called and explained everything to Tonya at the [place] [facility], that was in my prior note.  She stated that they may be able to work her in for dialysis tomorrow and I told her the patient was gone because she said she could not stay but if they are able to work her in tomorrow and can get in contact with the patient they can let us know and send her back here for transfusion today.  I also stated that in the mean time we did reschedule her for this Thursday at 8 and again stated we told her to go to the ER if she begins to feel bad and the patient verbalized understanding.  We will keep the patient on the schedule for this Thursday unless we here otherwise from the kidney center.

## 2015-05-01 ENCOUNTER — Ambulatory Visit
Admission: RE | Admit: 2015-05-01 | Discharge: 2015-05-01 | Disposition: A | Discharge status: Home / Self Care | Payer: BLUE CROSS/BLUE SHIELD | Source: Ambulatory Visit | Attending: Nephrology | Admitting: Nephrology

## 2015-05-01 ENCOUNTER — Other Ambulatory Visit: Payer: Self-pay | Admitting: Nephrology

## 2015-05-01 DIAGNOSIS — R059 Cough, unspecified: Secondary | ICD-10-CM

## 2015-05-01 DIAGNOSIS — R05 Cough: Secondary | ICD-10-CM

## 2015-05-01 IMAGING — CR DG CHEST 2V
2 series · 2 of 2 positions shown · non-contrast
Comparison: [DATE] chest radiograph.

CLINICAL DATA: Cough, congestion and fever for 2 weeks. Dialysis
patient.

EXAM:
CHEST  2 VIEW

[view not recorded (1 of 2)]
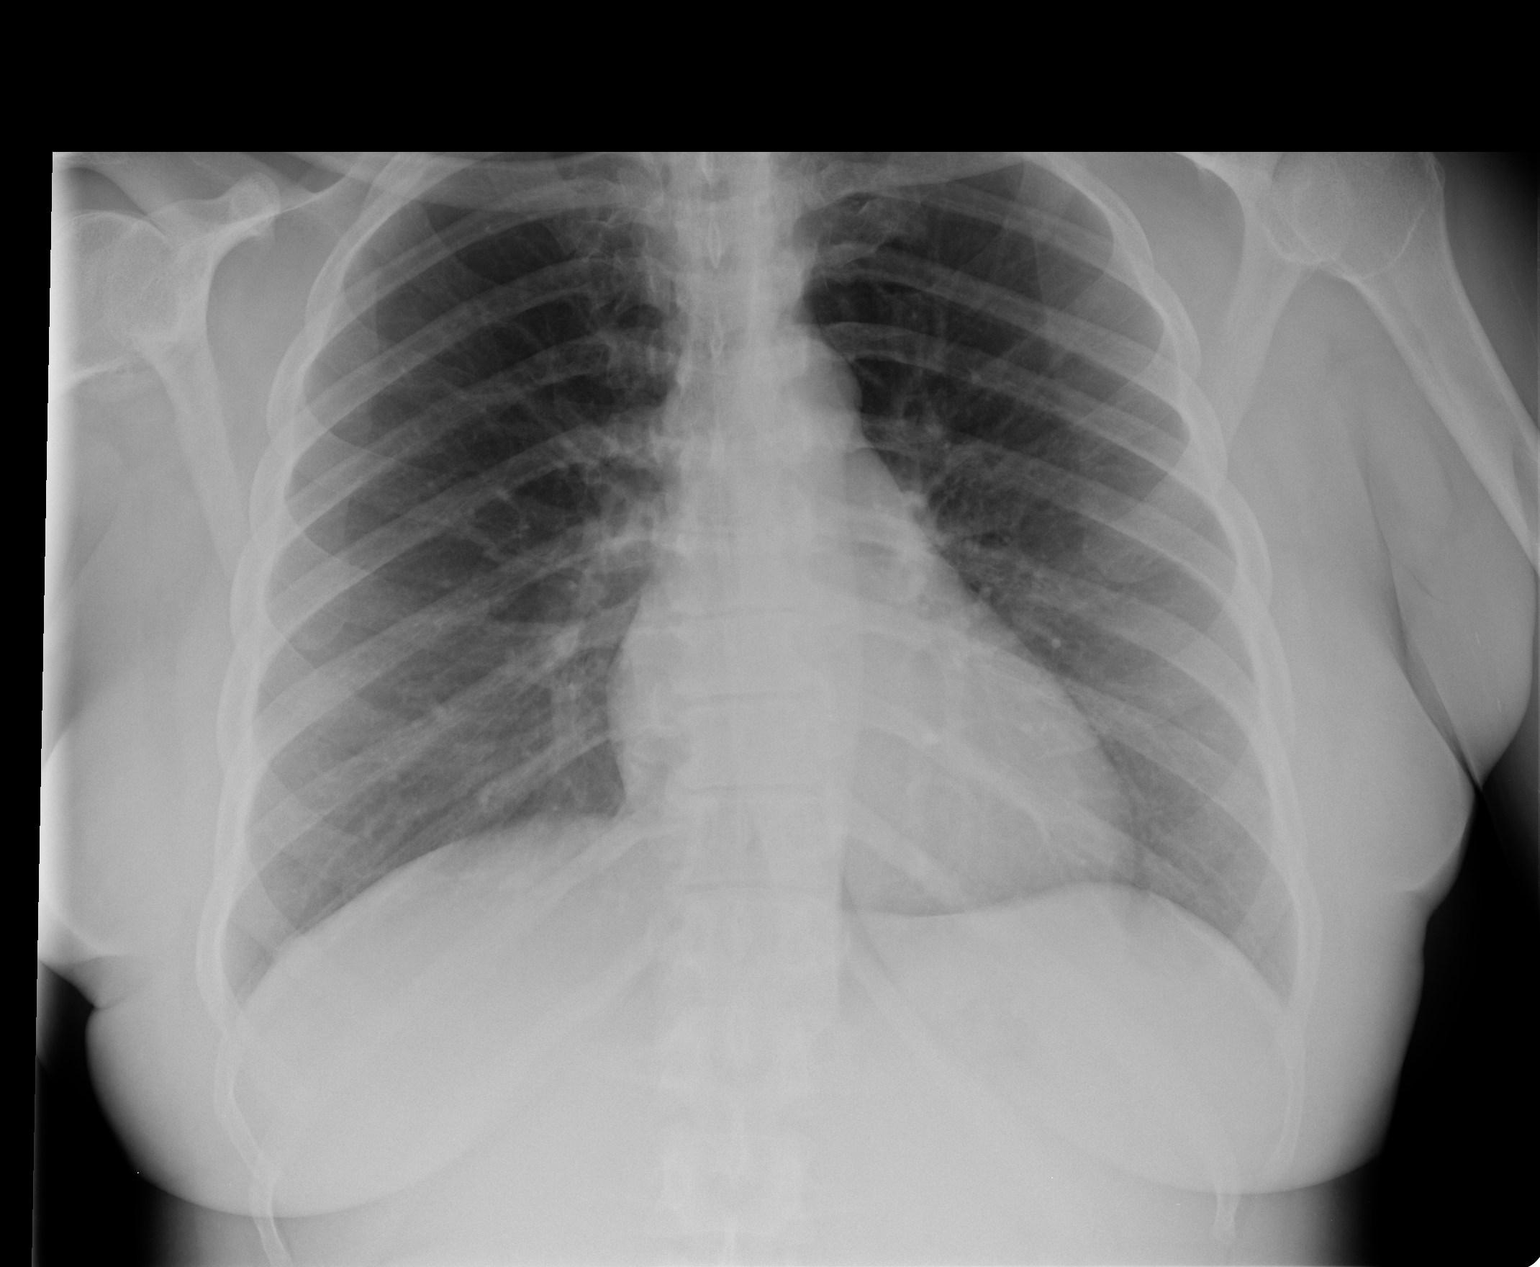

[view not recorded (2 of 2)]
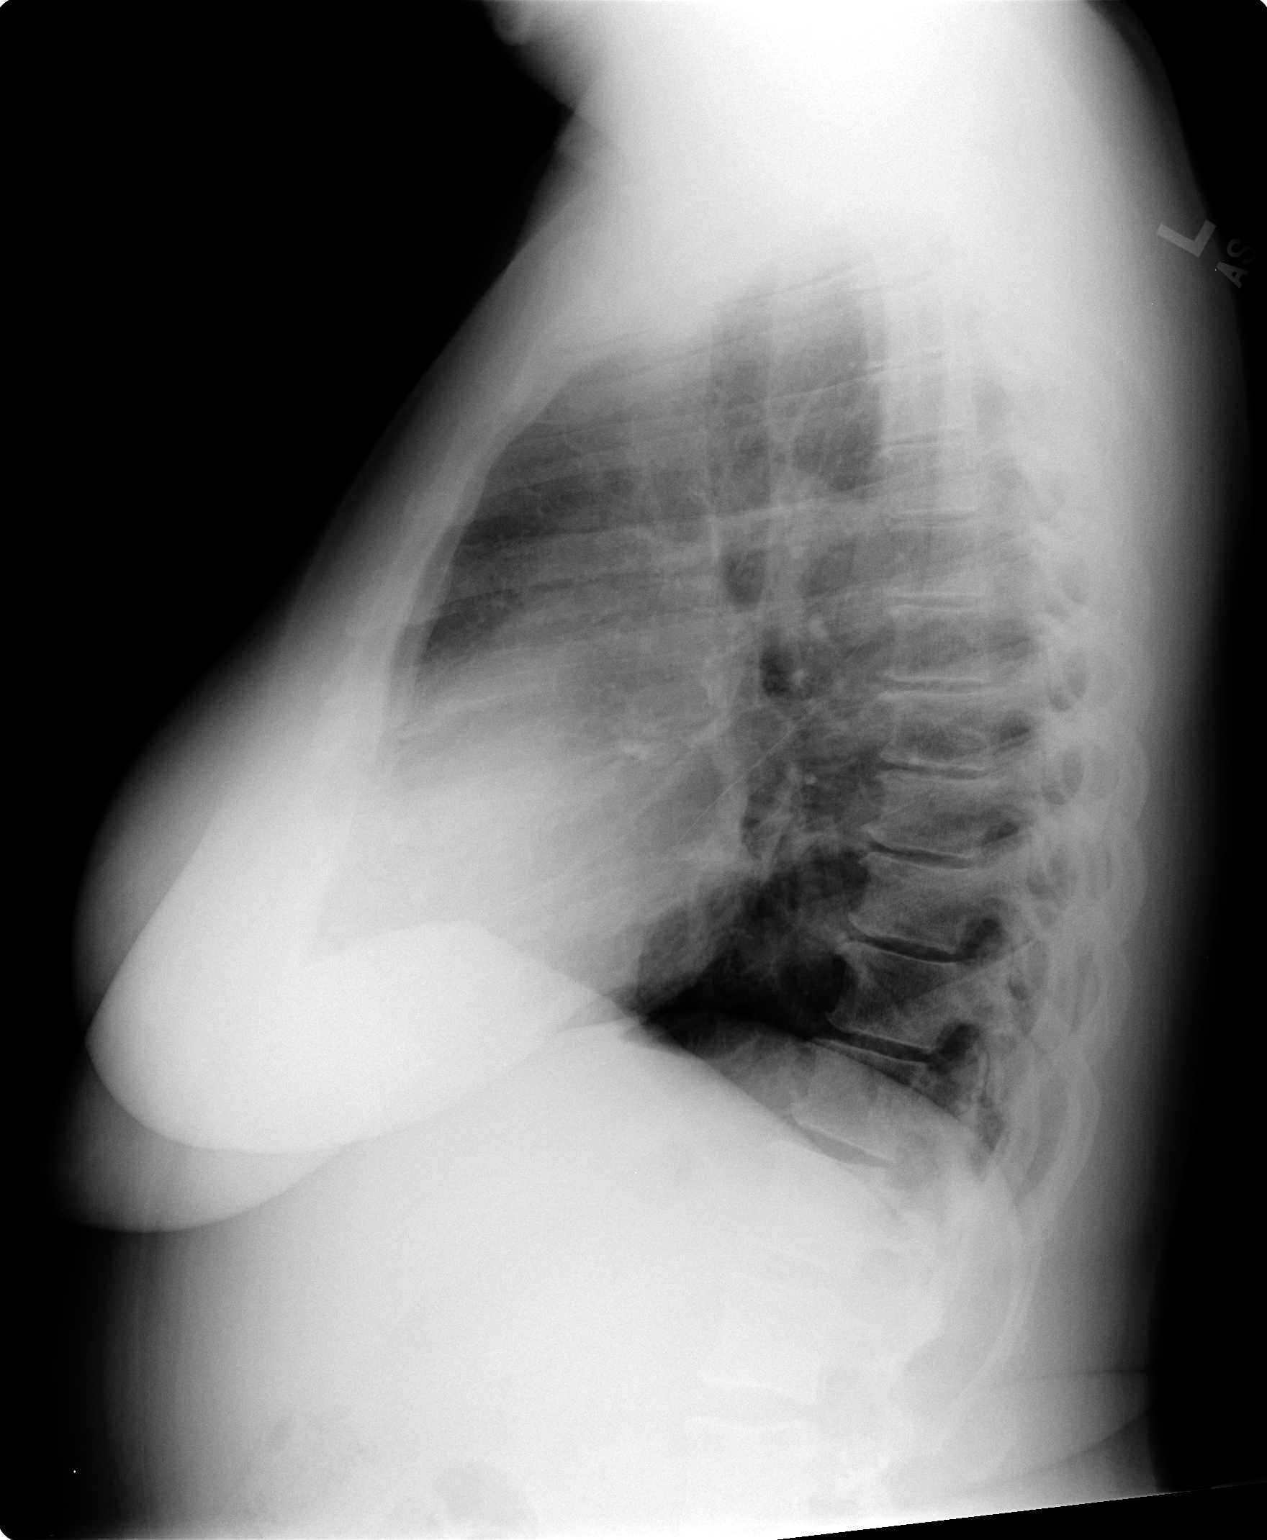

[2 of 2 positions shown; findings below may reference images not displayed]

FINDINGS: Stable cardiomediastinal silhouette with top-normal heart size. No
pneumothorax. No pleural effusion. Clear lungs, with no focal lung
consolidation and no pulmonary edema. Mild degenerative changes in
the thoracic spine.
IMPRESSION: No active disease in the chest.

## 2015-05-02 ENCOUNTER — Other Ambulatory Visit (HOSPITAL_COMMUNITY): Payer: Self-pay | Admitting: *Deleted

## 2015-05-03 ENCOUNTER — Ambulatory Visit (HOSPITAL_COMMUNITY)
Admission: RE | Admit: 2015-05-03 | Discharge: 2015-05-03 | Disposition: A | Discharge status: Home / Self Care | Payer: BLUE CROSS/BLUE SHIELD | Source: Ambulatory Visit | Attending: Nephrology | Admitting: Nephrology

## 2015-05-03 ENCOUNTER — Encounter (HOSPITAL_COMMUNITY)
Admission: RE | Admit: 2015-05-03 | Discharge: 2015-05-03 | Disposition: A | Discharge status: Home / Self Care | Payer: BLUE CROSS/BLUE SHIELD | Source: Ambulatory Visit | Attending: Nephrology | Admitting: Nephrology

## 2015-05-03 DIAGNOSIS — D649 Anemia, unspecified: Secondary | ICD-10-CM | POA: Insufficient documentation

## 2015-05-03 DIAGNOSIS — T8612 Kidney transplant failure: Secondary | ICD-10-CM | POA: Insufficient documentation

## 2015-05-03 LAB — PREPARE RBC (CROSSMATCH)

## 2015-05-03 MED ORDER — DIPHENHYDRAMINE HCL 25 MG PO CAPS
25.0000 mg | ORAL_CAPSULE | Freq: Once | ORAL | Status: AC
  Administered 2015-05-03: 25 mg via ORAL

## 2015-05-03 MED ORDER — SODIUM CHLORIDE 0.9 % IV SOLN: Freq: Once | INTRAVENOUS | Status: DC

## 2015-05-03 MED ORDER — ACETAMINOPHEN 325 MG PO TABS
ORAL_TABLET | ORAL | Status: AC
  Filled 2015-05-03: qty 1

## 2015-05-03 MED ORDER — ACETAMINOPHEN 325 MG PO TABS
325.0000 mg | ORAL_TABLET | Freq: Once | ORAL | Status: AC
  Administered 2015-05-03: 325 mg via ORAL

## 2015-05-03 MED ORDER — DIPHENHYDRAMINE HCL 25 MG PO CAPS
ORAL_CAPSULE | ORAL | Status: AC
  Filled 2015-05-03: qty 1

## 2015-05-04 LAB — TYPE AND SCREEN
ABO/RH(D): A POS
ANTIBODY SCREEN: NEGATIVE
UNIT DIVISION: 0
Unit division: 0

## 2015-05-08 DIAGNOSIS — T8612 Kidney transplant failure: Secondary | ICD-10-CM | POA: Insufficient documentation

## 2015-05-08 DIAGNOSIS — Z4822 Encounter for aftercare following kidney transplant: Secondary | ICD-10-CM | POA: Diagnosis not present

## 2015-05-08 DIAGNOSIS — T8691 Unspecified transplanted organ and tissue rejection: Secondary | ICD-10-CM | POA: Insufficient documentation

## 2015-05-08 DIAGNOSIS — Z992 Dependence on renal dialysis: Secondary | ICD-10-CM | POA: Diagnosis not present

## 2015-05-08 DIAGNOSIS — Z940 Kidney transplant status: Secondary | ICD-10-CM | POA: Diagnosis not present

## 2015-05-08 DIAGNOSIS — Z9114 Patient's other noncompliance with medication regimen: Secondary | ICD-10-CM | POA: Diagnosis not present

## 2015-05-08 DIAGNOSIS — N186 End stage renal disease: Secondary | ICD-10-CM | POA: Diagnosis not present

## 2015-05-08 DIAGNOSIS — T8611 Kidney transplant rejection: Secondary | ICD-10-CM | POA: Diagnosis not present

## 2015-05-08 DIAGNOSIS — I158 Other secondary hypertension: Secondary | ICD-10-CM | POA: Diagnosis not present

## 2015-05-08 DIAGNOSIS — Z949 Transplanted organ and tissue status, unspecified: Secondary | ICD-10-CM | POA: Diagnosis not present

## 2015-05-09 DIAGNOSIS — N186 End stage renal disease: Secondary | ICD-10-CM | POA: Diagnosis not present

## 2015-05-09 DIAGNOSIS — T8619 Other complication of kidney transplant: Secondary | ICD-10-CM | POA: Diagnosis not present

## 2015-05-11 DIAGNOSIS — T8691 Unspecified transplanted organ and tissue rejection: Secondary | ICD-10-CM | POA: Diagnosis not present

## 2015-05-11 DIAGNOSIS — R509 Fever, unspecified: Secondary | ICD-10-CM | POA: Diagnosis not present

## 2015-05-11 DIAGNOSIS — R918 Other nonspecific abnormal finding of lung field: Secondary | ICD-10-CM | POA: Diagnosis not present

## 2015-05-17 ENCOUNTER — Ambulatory Visit (INDEPENDENT_AMBULATORY_CARE_PROVIDER_SITE_OTHER): Payer: BLUE CROSS/BLUE SHIELD | Admitting: Infectious Disease

## 2015-05-17 ENCOUNTER — Encounter: Payer: Self-pay | Admitting: Infectious Disease

## 2015-05-17 VITALS — BP 124/78 | HR 79 | Temp 98.8°F | Wt 181.5 lb

## 2015-05-17 DIAGNOSIS — N186 End stage renal disease: Secondary | ICD-10-CM

## 2015-05-17 DIAGNOSIS — Z992 Dependence on renal dialysis: Secondary | ICD-10-CM | POA: Diagnosis not present

## 2015-05-17 DIAGNOSIS — Z940 Kidney transplant status: Secondary | ICD-10-CM | POA: Diagnosis not present

## 2015-05-17 DIAGNOSIS — K5900 Constipation, unspecified: Secondary | ICD-10-CM | POA: Diagnosis not present

## 2015-05-17 DIAGNOSIS — E1122 Type 2 diabetes mellitus with diabetic chronic kidney disease: Secondary | ICD-10-CM | POA: Diagnosis not present

## 2015-05-17 DIAGNOSIS — Z905 Acquired absence of kidney: Secondary | ICD-10-CM | POA: Diagnosis not present

## 2015-05-17 DIAGNOSIS — T8612 Kidney transplant failure: Secondary | ICD-10-CM | POA: Diagnosis not present

## 2015-05-17 DIAGNOSIS — R509 Fever, unspecified: Secondary | ICD-10-CM | POA: Diagnosis not present

## 2015-05-17 DIAGNOSIS — I120 Hypertensive chronic kidney disease with stage 5 chronic kidney disease or end stage renal disease: Secondary | ICD-10-CM | POA: Diagnosis not present

## 2015-05-17 DIAGNOSIS — R112 Nausea with vomiting, unspecified: Secondary | ICD-10-CM | POA: Diagnosis not present

## 2015-05-17 DIAGNOSIS — R197 Diarrhea, unspecified: Secondary | ICD-10-CM | POA: Diagnosis not present

## 2015-05-17 DIAGNOSIS — Z7982 Long term (current) use of aspirin: Secondary | ICD-10-CM | POA: Diagnosis not present

## 2015-05-17 DIAGNOSIS — Z9119 Patient's noncompliance with other medical treatment and regimen: Secondary | ICD-10-CM | POA: Diagnosis not present

## 2015-05-17 DIAGNOSIS — T8611 Kidney transplant rejection: Secondary | ICD-10-CM | POA: Diagnosis not present

## 2015-05-17 HISTORY — DX: Fever, unspecified: R50.9

## 2015-05-17 NOTE — Progress Notes (Signed)
Reason for Consult: Fevers of unknown origin  Requesting Physician: Dr. Joelyn Oms  Subjective:    Patient ID: Ruth Gutierrez, female    DOB: 1965-11-15, 49 y.o.   MRN: [id number]  HPI  49 year old with history of renal transplantation 5 years ago who unfortunately had to go back on HD. In the past 3-4 months she had developed intense night sweats, fevers and was being monitored by Nephrology who did initial workup with blood cultures, CXR gave azithromycin. They were worried about renal transplant rejection and increased immunosuppression with steroids but this failed to resolve this and it became more obvious that she was having rejection with classic fevers, lower abdominal pain at the kidney site and she had explantation of her kidney and removal of immunosuppressive drugs. She has had resolution of all of her symptoms since then.  Past Medical History  Diagnosis Date  . Deceased-donor kidney transplant     Performed at [facility], April 2010.  Initial ESRD due to HTN nephropathy  . Anemia of chronic disease   . History of hyperparathyroidism   . Hypertension   . Shortness of breath   . GERD (gastroesophageal reflux disease)   . Headache(784.0)   . Arthritis   . Wears glasses   . Eczema   . ESRD (end stage renal disease) ([place])     s/p transplant creatinine baseline 1.1  M/W/F dialysis  . FUO (fever of unknown origin) 05/17/2015    Past Surgical History  Procedure Laterality Date  . Kidney transplant  11/2008    [facility])  . Av fistula placement    . Fracture surgery      left foot,baby toe nad next toe missing  . Wisdom tooth extraction    . Bascilic vein transposition Left 10/30/2014    Procedure: LEFT BASCILIC VEIN TRANSPOSITION;  Surgeon: Rosetta Posner, MD;  Location: [facility];  Service: Vascular;  Laterality: Left;  . Insertion of dialysis catheter Right 10/30/2014    Procedure: INSERTION OF DIALYSIS CATHETER;  Surgeon: Rosetta Posner, MD;  Location: [facility];   Service: Vascular;  Laterality: Right;  . Bascilic vein transposition Left 01/03/2015    Procedure: LEFT ARM 2ND STAGE [place];  Surgeon: Rosetta Posner, MD;  Location: [facility] OR;  Service: Vascular;  Laterality: Left;    Family History  Problem Relation Age of Onset  . Diabetes Brother   . Deep vein thrombosis Brother   . Hypertension Mother   . Hypertension Sister   . Hyperlipidemia Sister   . Hypertension Father   . Kidney disease Brother     on HD      Social History   Social History  . Marital Status: Single    Spouse Name: N/A  . Number of Children: N/A  . Years of Education: N/A   Social History Main Topics  . Smoking status: Current Every Day Smoker -- 0.25 packs/day for 27 years    Types: Cigarettes  . Smokeless tobacco: Never Used     Comment: 10 cigarettes a day  . Alcohol Use: No     Comment: occasional drinker noted in the past  . Drug Use: No  . Sexual Activity:    Partners: Male    Birth Control/ Protection: None   Other Topics Concern  . None   Social History Narrative   Lives with daughter (born 2), sister helps her with medications etc.    Allergies  Allergen Reactions  . Penicillins Other (See  Comments)    Itchiness     Current outpatient prescriptions:  .  amLODipine (NORVASC) 10 MG tablet, Take 10 mg by mouth daily. , Disp: , Rfl:  .  aspirin 81 MG tablet, Take 81 mg by mouth daily. , Disp: , Rfl:  .  atenolol (TENORMIN) 50 MG tablet, Take 50 mg by mouth 2 (two) times daily. , Disp: , Rfl:  .  calcitRIOL (ROCALTROL) 0.25 MCG capsule, Take 0.25 mcg by mouth 3 (three) times a week. Monday, Wednesday, Friday, Disp: , Rfl: 6 .  calcium acetate (PHOSLO) 667 MG capsule, Take 1,334 mg by mouth., Disp: , Rfl:  .  diphenhydrAMINE (BENADRYL) 25 MG tablet, Take 25 mg by mouth every 8 (eight) hours as needed (for cough)., Disp: , Rfl:  .  docusate sodium (COLACE) 100 MG capsule, Take 100 mg by mouth 2 (two) times daily as needed for  constipation. , Disp: , Rfl:  .  norethindrone (AYGESTIN) 5 MG tablet, Take 5 mg by mouth 2 (two) times daily. , Disp: , Rfl: 11 .  nystatin (MYCOSTATIN) 100000 UNIT/ML suspension, Take 5 mLs by mouth 2 (two) times daily as needed (thrush). , Disp: , Rfl: 0 .  oxyCODONE-acetaminophen (ROXICET) 5-325 MG per tablet, Take 1 tablet by mouth every 6 (six) hours as needed for severe pain., Disp: 30 tablet, Rfl: 0 .  sevelamer carbonate (RENVELA) 800 MG tablet, Take 800 mg by mouth., Disp: , Rfl:  .  sodium bicarbonate 650 MG tablet, Take 650 mg by mouth as needed. , Disp: , Rfl: 2   Review of Systems  Constitutional: Negative for fever, chills, diaphoresis, activity change, appetite change, fatigue and unexpected weight change.  HENT: Negative for congestion, rhinorrhea, sinus pressure, sneezing, sore throat and trouble swallowing.   Eyes: Negative for photophobia and visual disturbance.  Respiratory: Negative for cough, chest tightness, shortness of breath, wheezing and stridor.   Cardiovascular: Negative for chest pain, palpitations and leg swelling.  Gastrointestinal: Negative for nausea, vomiting, abdominal pain, diarrhea, constipation, blood in stool, abdominal distention and anal bleeding.  Genitourinary: Negative for dysuria, hematuria, flank pain and difficulty urinating.  Musculoskeletal: Negative for myalgias, back pain, joint swelling, arthralgias and gait problem.  Skin: Positive for wound. Negative for color change, pallor and rash.  Neurological: Negative for dizziness, tremors, weakness and light-headedness.  Hematological: Negative for adenopathy. Does not bruise/bleed easily.  Psychiatric/Behavioral: Negative for behavioral problems, confusion, sleep disturbance, dysphoric mood, decreased concentration and agitation.       Objective:   Physical Exam  Constitutional: She is oriented to person, place, and time. She appears well-developed and well-nourished. No distress.  HENT:    Head: Normocephalic and atraumatic.  Mouth/Throat: No oropharyngeal exudate.  Eyes: Conjunctivae and EOM are normal. No scleral icterus.  Neck: Normal range of motion. Neck supple.  Cardiovascular: Normal rate and regular rhythm.   Pulmonary/Chest: Effort normal. No respiratory distress. She has no wheezes.  Abdominal: She exhibits no distension.  Musculoskeletal: She exhibits no edema or tenderness.  Neurological: She is alert and oriented to person, place, and time. She exhibits normal muscle tone. Coordination normal.  Skin: Skin is warm and dry. No rash noted. She is not diaphoretic. No erythema. No pallor.  Psychiatric: She has a normal mood and affect. Her behavior is normal. Judgment and thought content normal.   Operative site 05/17/15: clean dry        Assessment & Plan:  FUO: seems highly likely to have been due to rejection  as fevers, night sweats have resolved.   STD screen: will check for HIV  RTC PRN

## 2015-05-18 LAB — HIV ANTIBODY (ROUTINE TESTING W REFLEX): HIV: NONREACTIVE

## 2015-05-24 DIAGNOSIS — I120 Hypertensive chronic kidney disease with stage 5 chronic kidney disease or end stage renal disease: Secondary | ICD-10-CM | POA: Diagnosis not present

## 2015-05-24 DIAGNOSIS — T8612 Kidney transplant failure: Secondary | ICD-10-CM | POA: Diagnosis not present

## 2015-05-24 DIAGNOSIS — Z9114 Patient's other noncompliance with medication regimen: Secondary | ICD-10-CM | POA: Diagnosis not present

## 2015-05-24 DIAGNOSIS — Z7982 Long term (current) use of aspirin: Secondary | ICD-10-CM | POA: Diagnosis not present

## 2015-05-24 DIAGNOSIS — Z9885 Transplanted organ removal status: Secondary | ICD-10-CM | POA: Diagnosis not present

## 2015-05-24 DIAGNOSIS — Z940 Kidney transplant status: Secondary | ICD-10-CM | POA: Diagnosis not present

## 2015-05-24 DIAGNOSIS — N186 End stage renal disease: Secondary | ICD-10-CM | POA: Diagnosis not present

## 2015-05-24 DIAGNOSIS — I151 Hypertension secondary to other renal disorders: Secondary | ICD-10-CM | POA: Diagnosis not present

## 2015-05-24 DIAGNOSIS — Z79899 Other long term (current) drug therapy: Secondary | ICD-10-CM | POA: Diagnosis not present

## 2015-05-30 DIAGNOSIS — E1129 Type 2 diabetes mellitus with other diabetic kidney complication: Secondary | ICD-10-CM | POA: Diagnosis not present

## 2015-08-06 DIAGNOSIS — N2581 Secondary hyperparathyroidism of renal origin: Secondary | ICD-10-CM | POA: Diagnosis not present

## 2015-08-06 DIAGNOSIS — D631 Anemia in chronic kidney disease: Secondary | ICD-10-CM | POA: Diagnosis not present

## 2015-08-06 DIAGNOSIS — N186 End stage renal disease: Secondary | ICD-10-CM | POA: Diagnosis not present

## 2015-08-08 DIAGNOSIS — N2581 Secondary hyperparathyroidism of renal origin: Secondary | ICD-10-CM | POA: Diagnosis not present

## 2015-08-08 DIAGNOSIS — D631 Anemia in chronic kidney disease: Secondary | ICD-10-CM | POA: Diagnosis not present

## 2015-08-08 DIAGNOSIS — N186 End stage renal disease: Secondary | ICD-10-CM | POA: Diagnosis not present

## 2015-08-10 DIAGNOSIS — D631 Anemia in chronic kidney disease: Secondary | ICD-10-CM | POA: Diagnosis not present

## 2015-08-10 DIAGNOSIS — N2581 Secondary hyperparathyroidism of renal origin: Secondary | ICD-10-CM | POA: Diagnosis not present

## 2015-08-10 DIAGNOSIS — N186 End stage renal disease: Secondary | ICD-10-CM | POA: Diagnosis not present

## 2015-08-29 DIAGNOSIS — E1129 Type 2 diabetes mellitus with other diabetic kidney complication: Secondary | ICD-10-CM | POA: Diagnosis not present

## 2015-09-04 DIAGNOSIS — Z992 Dependence on renal dialysis: Secondary | ICD-10-CM | POA: Diagnosis not present

## 2015-09-04 DIAGNOSIS — I129 Hypertensive chronic kidney disease with stage 1 through stage 4 chronic kidney disease, or unspecified chronic kidney disease: Secondary | ICD-10-CM | POA: Diagnosis not present

## 2015-09-04 DIAGNOSIS — N186 End stage renal disease: Secondary | ICD-10-CM | POA: Diagnosis not present

## 2015-09-19 DIAGNOSIS — D509 Iron deficiency anemia, unspecified: Secondary | ICD-10-CM | POA: Diagnosis not present

## 2015-09-19 DIAGNOSIS — E1129 Type 2 diabetes mellitus with other diabetic kidney complication: Secondary | ICD-10-CM | POA: Diagnosis not present

## 2015-09-19 DIAGNOSIS — N2581 Secondary hyperparathyroidism of renal origin: Secondary | ICD-10-CM | POA: Diagnosis not present

## 2015-09-19 DIAGNOSIS — N186 End stage renal disease: Secondary | ICD-10-CM | POA: Diagnosis not present

## 2015-09-19 DIAGNOSIS — D631 Anemia in chronic kidney disease: Secondary | ICD-10-CM | POA: Diagnosis not present

## 2015-09-21 DIAGNOSIS — N2581 Secondary hyperparathyroidism of renal origin: Secondary | ICD-10-CM | POA: Diagnosis not present

## 2015-09-21 DIAGNOSIS — D509 Iron deficiency anemia, unspecified: Secondary | ICD-10-CM | POA: Diagnosis not present

## 2015-09-21 DIAGNOSIS — E1129 Type 2 diabetes mellitus with other diabetic kidney complication: Secondary | ICD-10-CM | POA: Diagnosis not present

## 2015-09-21 DIAGNOSIS — N186 End stage renal disease: Secondary | ICD-10-CM | POA: Diagnosis not present

## 2015-09-21 DIAGNOSIS — D631 Anemia in chronic kidney disease: Secondary | ICD-10-CM | POA: Diagnosis not present

## 2015-09-24 DIAGNOSIS — D509 Iron deficiency anemia, unspecified: Secondary | ICD-10-CM | POA: Diagnosis not present

## 2015-09-24 DIAGNOSIS — N2581 Secondary hyperparathyroidism of renal origin: Secondary | ICD-10-CM | POA: Diagnosis not present

## 2015-09-24 DIAGNOSIS — E1129 Type 2 diabetes mellitus with other diabetic kidney complication: Secondary | ICD-10-CM | POA: Diagnosis not present

## 2015-09-24 DIAGNOSIS — D631 Anemia in chronic kidney disease: Secondary | ICD-10-CM | POA: Diagnosis not present

## 2015-09-24 DIAGNOSIS — N186 End stage renal disease: Secondary | ICD-10-CM | POA: Diagnosis not present

## 2015-09-26 DIAGNOSIS — N186 End stage renal disease: Secondary | ICD-10-CM | POA: Diagnosis not present

## 2015-09-26 DIAGNOSIS — D631 Anemia in chronic kidney disease: Secondary | ICD-10-CM | POA: Diagnosis not present

## 2015-09-26 DIAGNOSIS — N2581 Secondary hyperparathyroidism of renal origin: Secondary | ICD-10-CM | POA: Diagnosis not present

## 2015-09-26 DIAGNOSIS — E1129 Type 2 diabetes mellitus with other diabetic kidney complication: Secondary | ICD-10-CM | POA: Diagnosis not present

## 2015-09-26 DIAGNOSIS — D509 Iron deficiency anemia, unspecified: Secondary | ICD-10-CM | POA: Diagnosis not present

## 2015-09-28 DIAGNOSIS — D631 Anemia in chronic kidney disease: Secondary | ICD-10-CM | POA: Diagnosis not present

## 2015-09-28 DIAGNOSIS — E1129 Type 2 diabetes mellitus with other diabetic kidney complication: Secondary | ICD-10-CM | POA: Diagnosis not present

## 2015-09-28 DIAGNOSIS — N186 End stage renal disease: Secondary | ICD-10-CM | POA: Diagnosis not present

## 2015-09-28 DIAGNOSIS — N2581 Secondary hyperparathyroidism of renal origin: Secondary | ICD-10-CM | POA: Diagnosis not present

## 2015-09-28 DIAGNOSIS — D509 Iron deficiency anemia, unspecified: Secondary | ICD-10-CM | POA: Diagnosis not present

## 2015-10-01 DIAGNOSIS — D509 Iron deficiency anemia, unspecified: Secondary | ICD-10-CM | POA: Diagnosis not present

## 2015-10-01 DIAGNOSIS — E1129 Type 2 diabetes mellitus with other diabetic kidney complication: Secondary | ICD-10-CM | POA: Diagnosis not present

## 2015-10-01 DIAGNOSIS — D631 Anemia in chronic kidney disease: Secondary | ICD-10-CM | POA: Diagnosis not present

## 2015-10-01 DIAGNOSIS — N186 End stage renal disease: Secondary | ICD-10-CM | POA: Diagnosis not present

## 2015-10-01 DIAGNOSIS — N2581 Secondary hyperparathyroidism of renal origin: Secondary | ICD-10-CM | POA: Diagnosis not present

## 2015-10-02 DIAGNOSIS — I129 Hypertensive chronic kidney disease with stage 1 through stage 4 chronic kidney disease, or unspecified chronic kidney disease: Secondary | ICD-10-CM | POA: Diagnosis not present

## 2015-10-02 DIAGNOSIS — Z992 Dependence on renal dialysis: Secondary | ICD-10-CM | POA: Diagnosis not present

## 2015-10-02 DIAGNOSIS — N186 End stage renal disease: Secondary | ICD-10-CM | POA: Diagnosis not present

## 2015-10-03 DIAGNOSIS — N2581 Secondary hyperparathyroidism of renal origin: Secondary | ICD-10-CM | POA: Diagnosis not present

## 2015-10-03 DIAGNOSIS — D631 Anemia in chronic kidney disease: Secondary | ICD-10-CM | POA: Diagnosis not present

## 2015-10-03 DIAGNOSIS — D509 Iron deficiency anemia, unspecified: Secondary | ICD-10-CM | POA: Diagnosis not present

## 2015-10-03 DIAGNOSIS — N186 End stage renal disease: Secondary | ICD-10-CM | POA: Diagnosis not present

## 2015-10-03 DIAGNOSIS — E1129 Type 2 diabetes mellitus with other diabetic kidney complication: Secondary | ICD-10-CM | POA: Diagnosis not present

## 2015-10-03 DIAGNOSIS — R509 Fever, unspecified: Secondary | ICD-10-CM | POA: Diagnosis not present

## 2015-10-05 DIAGNOSIS — N2581 Secondary hyperparathyroidism of renal origin: Secondary | ICD-10-CM | POA: Diagnosis not present

## 2015-10-05 DIAGNOSIS — N186 End stage renal disease: Secondary | ICD-10-CM | POA: Diagnosis not present

## 2015-10-05 DIAGNOSIS — D631 Anemia in chronic kidney disease: Secondary | ICD-10-CM | POA: Diagnosis not present

## 2015-10-05 DIAGNOSIS — R509 Fever, unspecified: Secondary | ICD-10-CM | POA: Diagnosis not present

## 2015-10-05 DIAGNOSIS — E1129 Type 2 diabetes mellitus with other diabetic kidney complication: Secondary | ICD-10-CM | POA: Diagnosis not present

## 2015-10-05 DIAGNOSIS — D509 Iron deficiency anemia, unspecified: Secondary | ICD-10-CM | POA: Diagnosis not present

## 2015-10-08 DIAGNOSIS — E1129 Type 2 diabetes mellitus with other diabetic kidney complication: Secondary | ICD-10-CM | POA: Diagnosis not present

## 2015-10-08 DIAGNOSIS — D631 Anemia in chronic kidney disease: Secondary | ICD-10-CM | POA: Diagnosis not present

## 2015-10-08 DIAGNOSIS — N186 End stage renal disease: Secondary | ICD-10-CM | POA: Diagnosis not present

## 2015-10-08 DIAGNOSIS — D509 Iron deficiency anemia, unspecified: Secondary | ICD-10-CM | POA: Diagnosis not present

## 2015-10-08 DIAGNOSIS — N2581 Secondary hyperparathyroidism of renal origin: Secondary | ICD-10-CM | POA: Diagnosis not present

## 2015-10-08 DIAGNOSIS — R509 Fever, unspecified: Secondary | ICD-10-CM | POA: Diagnosis not present

## 2015-10-10 DIAGNOSIS — D509 Iron deficiency anemia, unspecified: Secondary | ICD-10-CM | POA: Diagnosis not present

## 2015-10-10 DIAGNOSIS — N2581 Secondary hyperparathyroidism of renal origin: Secondary | ICD-10-CM | POA: Diagnosis not present

## 2015-10-10 DIAGNOSIS — R509 Fever, unspecified: Secondary | ICD-10-CM | POA: Diagnosis not present

## 2015-10-10 DIAGNOSIS — D631 Anemia in chronic kidney disease: Secondary | ICD-10-CM | POA: Diagnosis not present

## 2015-10-10 DIAGNOSIS — E1129 Type 2 diabetes mellitus with other diabetic kidney complication: Secondary | ICD-10-CM | POA: Diagnosis not present

## 2015-10-10 DIAGNOSIS — N186 End stage renal disease: Secondary | ICD-10-CM | POA: Diagnosis not present

## 2015-10-12 DIAGNOSIS — N2581 Secondary hyperparathyroidism of renal origin: Secondary | ICD-10-CM | POA: Diagnosis not present

## 2015-10-12 DIAGNOSIS — N186 End stage renal disease: Secondary | ICD-10-CM | POA: Diagnosis not present

## 2015-10-12 DIAGNOSIS — D509 Iron deficiency anemia, unspecified: Secondary | ICD-10-CM | POA: Diagnosis not present

## 2015-10-12 DIAGNOSIS — D631 Anemia in chronic kidney disease: Secondary | ICD-10-CM | POA: Diagnosis not present

## 2015-10-12 DIAGNOSIS — E1129 Type 2 diabetes mellitus with other diabetic kidney complication: Secondary | ICD-10-CM | POA: Diagnosis not present

## 2015-10-12 DIAGNOSIS — R509 Fever, unspecified: Secondary | ICD-10-CM | POA: Diagnosis not present

## 2015-10-15 DIAGNOSIS — N2581 Secondary hyperparathyroidism of renal origin: Secondary | ICD-10-CM | POA: Diagnosis not present

## 2015-10-15 DIAGNOSIS — E1129 Type 2 diabetes mellitus with other diabetic kidney complication: Secondary | ICD-10-CM | POA: Diagnosis not present

## 2015-10-15 DIAGNOSIS — R509 Fever, unspecified: Secondary | ICD-10-CM | POA: Diagnosis not present

## 2015-10-15 DIAGNOSIS — D509 Iron deficiency anemia, unspecified: Secondary | ICD-10-CM | POA: Diagnosis not present

## 2015-10-15 DIAGNOSIS — D631 Anemia in chronic kidney disease: Secondary | ICD-10-CM | POA: Diagnosis not present

## 2015-10-15 DIAGNOSIS — N186 End stage renal disease: Secondary | ICD-10-CM | POA: Diagnosis not present

## 2015-10-17 DIAGNOSIS — D509 Iron deficiency anemia, unspecified: Secondary | ICD-10-CM | POA: Diagnosis not present

## 2015-10-17 DIAGNOSIS — E1129 Type 2 diabetes mellitus with other diabetic kidney complication: Secondary | ICD-10-CM | POA: Diagnosis not present

## 2015-10-17 DIAGNOSIS — N186 End stage renal disease: Secondary | ICD-10-CM | POA: Diagnosis not present

## 2015-10-17 DIAGNOSIS — D631 Anemia in chronic kidney disease: Secondary | ICD-10-CM | POA: Diagnosis not present

## 2015-10-17 DIAGNOSIS — N2581 Secondary hyperparathyroidism of renal origin: Secondary | ICD-10-CM | POA: Diagnosis not present

## 2015-10-17 DIAGNOSIS — R509 Fever, unspecified: Secondary | ICD-10-CM | POA: Diagnosis not present

## 2015-10-19 DIAGNOSIS — R509 Fever, unspecified: Secondary | ICD-10-CM | POA: Diagnosis not present

## 2015-10-19 DIAGNOSIS — D631 Anemia in chronic kidney disease: Secondary | ICD-10-CM | POA: Diagnosis not present

## 2015-10-19 DIAGNOSIS — E1129 Type 2 diabetes mellitus with other diabetic kidney complication: Secondary | ICD-10-CM | POA: Diagnosis not present

## 2015-10-19 DIAGNOSIS — D509 Iron deficiency anemia, unspecified: Secondary | ICD-10-CM | POA: Diagnosis not present

## 2015-10-19 DIAGNOSIS — N186 End stage renal disease: Secondary | ICD-10-CM | POA: Diagnosis not present

## 2015-10-19 DIAGNOSIS — N2581 Secondary hyperparathyroidism of renal origin: Secondary | ICD-10-CM | POA: Diagnosis not present

## 2015-10-22 DIAGNOSIS — R509 Fever, unspecified: Secondary | ICD-10-CM | POA: Diagnosis not present

## 2015-10-22 DIAGNOSIS — E1129 Type 2 diabetes mellitus with other diabetic kidney complication: Secondary | ICD-10-CM | POA: Diagnosis not present

## 2015-10-22 DIAGNOSIS — N2581 Secondary hyperparathyroidism of renal origin: Secondary | ICD-10-CM | POA: Diagnosis not present

## 2015-10-22 DIAGNOSIS — D509 Iron deficiency anemia, unspecified: Secondary | ICD-10-CM | POA: Diagnosis not present

## 2015-10-22 DIAGNOSIS — D631 Anemia in chronic kidney disease: Secondary | ICD-10-CM | POA: Diagnosis not present

## 2015-10-22 DIAGNOSIS — N186 End stage renal disease: Secondary | ICD-10-CM | POA: Diagnosis not present

## 2015-10-24 DIAGNOSIS — N2581 Secondary hyperparathyroidism of renal origin: Secondary | ICD-10-CM | POA: Diagnosis not present

## 2015-10-24 DIAGNOSIS — D509 Iron deficiency anemia, unspecified: Secondary | ICD-10-CM | POA: Diagnosis not present

## 2015-10-24 DIAGNOSIS — R509 Fever, unspecified: Secondary | ICD-10-CM | POA: Diagnosis not present

## 2015-10-24 DIAGNOSIS — D631 Anemia in chronic kidney disease: Secondary | ICD-10-CM | POA: Diagnosis not present

## 2015-10-24 DIAGNOSIS — N186 End stage renal disease: Secondary | ICD-10-CM | POA: Diagnosis not present

## 2015-10-24 DIAGNOSIS — E1129 Type 2 diabetes mellitus with other diabetic kidney complication: Secondary | ICD-10-CM | POA: Diagnosis not present

## 2015-10-26 DIAGNOSIS — D509 Iron deficiency anemia, unspecified: Secondary | ICD-10-CM | POA: Diagnosis not present

## 2015-10-26 DIAGNOSIS — N186 End stage renal disease: Secondary | ICD-10-CM | POA: Diagnosis not present

## 2015-10-26 DIAGNOSIS — R509 Fever, unspecified: Secondary | ICD-10-CM | POA: Diagnosis not present

## 2015-10-26 DIAGNOSIS — N2581 Secondary hyperparathyroidism of renal origin: Secondary | ICD-10-CM | POA: Diagnosis not present

## 2015-10-26 DIAGNOSIS — D631 Anemia in chronic kidney disease: Secondary | ICD-10-CM | POA: Diagnosis not present

## 2015-10-26 DIAGNOSIS — E1129 Type 2 diabetes mellitus with other diabetic kidney complication: Secondary | ICD-10-CM | POA: Diagnosis not present

## 2015-10-29 DIAGNOSIS — D509 Iron deficiency anemia, unspecified: Secondary | ICD-10-CM | POA: Diagnosis not present

## 2015-10-29 DIAGNOSIS — E1129 Type 2 diabetes mellitus with other diabetic kidney complication: Secondary | ICD-10-CM | POA: Diagnosis not present

## 2015-10-29 DIAGNOSIS — R509 Fever, unspecified: Secondary | ICD-10-CM | POA: Diagnosis not present

## 2015-10-29 DIAGNOSIS — N2581 Secondary hyperparathyroidism of renal origin: Secondary | ICD-10-CM | POA: Diagnosis not present

## 2015-10-29 DIAGNOSIS — D631 Anemia in chronic kidney disease: Secondary | ICD-10-CM | POA: Diagnosis not present

## 2015-10-29 DIAGNOSIS — N186 End stage renal disease: Secondary | ICD-10-CM | POA: Diagnosis not present

## 2015-10-31 ENCOUNTER — Emergency Department (HOSPITAL_COMMUNITY)
Admission: EM | Admit: 2015-10-31 | Discharge: 2015-10-31 | Disposition: A | Discharge status: Home / Self Care | Payer: BLUE CROSS/BLUE SHIELD | Attending: Emergency Medicine | Admitting: Emergency Medicine

## 2015-10-31 ENCOUNTER — Encounter (HOSPITAL_COMMUNITY): Payer: Self-pay | Admitting: *Deleted

## 2015-10-31 ENCOUNTER — Emergency Department (HOSPITAL_COMMUNITY): Payer: BLUE CROSS/BLUE SHIELD

## 2015-10-31 DIAGNOSIS — N186 End stage renal disease: Secondary | ICD-10-CM | POA: Diagnosis not present

## 2015-10-31 DIAGNOSIS — Z940 Kidney transplant status: Secondary | ICD-10-CM | POA: Diagnosis not present

## 2015-10-31 DIAGNOSIS — M1990 Unspecified osteoarthritis, unspecified site: Secondary | ICD-10-CM | POA: Insufficient documentation

## 2015-10-31 DIAGNOSIS — Z862 Personal history of diseases of the blood and blood-forming organs and certain disorders involving the immune mechanism: Secondary | ICD-10-CM | POA: Diagnosis not present

## 2015-10-31 DIAGNOSIS — Z79899 Other long term (current) drug therapy: Secondary | ICD-10-CM | POA: Diagnosis not present

## 2015-10-31 DIAGNOSIS — Z8639 Personal history of other endocrine, nutritional and metabolic disease: Secondary | ICD-10-CM | POA: Diagnosis not present

## 2015-10-31 DIAGNOSIS — R109 Unspecified abdominal pain: Secondary | ICD-10-CM | POA: Diagnosis present

## 2015-10-31 DIAGNOSIS — R509 Fever, unspecified: Secondary | ICD-10-CM | POA: Diagnosis not present

## 2015-10-31 DIAGNOSIS — Z872 Personal history of diseases of the skin and subcutaneous tissue: Secondary | ICD-10-CM | POA: Insufficient documentation

## 2015-10-31 DIAGNOSIS — Z7982 Long term (current) use of aspirin: Secondary | ICD-10-CM | POA: Diagnosis not present

## 2015-10-31 DIAGNOSIS — N2581 Secondary hyperparathyroidism of renal origin: Secondary | ICD-10-CM | POA: Diagnosis not present

## 2015-10-31 DIAGNOSIS — Z992 Dependence on renal dialysis: Secondary | ICD-10-CM | POA: Insufficient documentation

## 2015-10-31 DIAGNOSIS — I120 Hypertensive chronic kidney disease with stage 5 chronic kidney disease or end stage renal disease: Secondary | ICD-10-CM | POA: Insufficient documentation

## 2015-10-31 DIAGNOSIS — F17210 Nicotine dependence, cigarettes, uncomplicated: Secondary | ICD-10-CM | POA: Diagnosis not present

## 2015-10-31 DIAGNOSIS — Z8719 Personal history of other diseases of the digestive system: Secondary | ICD-10-CM | POA: Insufficient documentation

## 2015-10-31 DIAGNOSIS — Z880 Allergy status to penicillin: Secondary | ICD-10-CM | POA: Diagnosis not present

## 2015-10-31 DIAGNOSIS — D509 Iron deficiency anemia, unspecified: Secondary | ICD-10-CM | POA: Diagnosis not present

## 2015-10-31 DIAGNOSIS — E1129 Type 2 diabetes mellitus with other diabetic kidney complication: Secondary | ICD-10-CM | POA: Diagnosis not present

## 2015-10-31 DIAGNOSIS — B349 Viral infection, unspecified: Secondary | ICD-10-CM | POA: Diagnosis not present

## 2015-10-31 DIAGNOSIS — D631 Anemia in chronic kidney disease: Secondary | ICD-10-CM | POA: Diagnosis not present

## 2015-10-31 LAB — COMPREHENSIVE METABOLIC PANEL
ALK PHOS: 61 U/L (ref 38–126)
ALT: 18 U/L (ref 14–54)
ANION GAP: 11 (ref 5–15)
AST: 27 U/L (ref 15–41)
Albumin: 3.2 g/dL — ABNORMAL LOW (ref 3.5–5.0)
BILIRUBIN TOTAL: 0.8 mg/dL (ref 0.3–1.2)
BUN: 16 mg/dL (ref 6–20)
CALCIUM: 8.2 mg/dL — AB (ref 8.9–10.3)
CHLORIDE: 94 mmol/L — AB (ref 101–111)
CO2: 32 mmol/L (ref 22–32)
CREATININE: 6.7 mg/dL — AB (ref 0.44–1.00)
GFR calc Af Amer: 8 mL/min — ABNORMAL LOW (ref >60)
GFR, EST NON AFRICAN AMERICAN: 7 mL/min — AB (ref >60)
Glucose, Bld: 93 mg/dL (ref 65–99)
Potassium: 4 mmol/L (ref 3.5–5.1)
Sodium: 137 mmol/L (ref 135–145)
TOTAL PROTEIN: 6.7 g/dL (ref 6.5–8.1)

## 2015-10-31 LAB — CBC WITH DIFFERENTIAL/PLATELET
Basophils Absolute: 0 10*3/uL (ref 0.0–0.1)
Basophils Relative: 0 %
EOS PCT: 0 %
Eosinophils Absolute: 0 10*3/uL (ref 0.0–0.7)
HEMATOCRIT: 35 % — AB (ref 36.0–46.0)
Hemoglobin: 12 g/dL (ref 12.0–15.0)
LYMPHS PCT: 19 %
Lymphs Abs: 0.9 10*3/uL (ref 0.7–4.0)
MCH: 32.8 pg (ref 26.0–34.0)
MCHC: 34.3 g/dL (ref 30.0–36.0)
MCV: 95.6 fL (ref 78.0–100.0)
MONO ABS: 0.6 10*3/uL (ref 0.1–1.0)
MONOS PCT: 13 %
NEUTROS ABS: 3.2 10*3/uL (ref 1.7–7.7)
Neutrophils Relative %: 68 %
Platelets: 127 10*3/uL — ABNORMAL LOW (ref 150–400)
RBC: 3.66 MIL/uL — ABNORMAL LOW (ref 3.87–5.11)
RDW: 14.3 % (ref 11.5–15.5)
WBC: 4.7 10*3/uL (ref 4.0–10.5)

## 2015-10-31 LAB — LIPASE, BLOOD: Lipase: 71 U/L — ABNORMAL HIGH (ref 11–51)

## 2015-10-31 IMAGING — DX DG CHEST 2V
2 series · 2 of 2 positions shown · non-contrast
Comparison: [DATE] and [DATE].

CLINICAL DATA: Flu-like symptoms with abdominal pain, dry cough,
shortness of breath and chest pain for 5 days. Dialysis patient.
Smoker.

EXAM:
CHEST  2 VIEW

[chest pa]
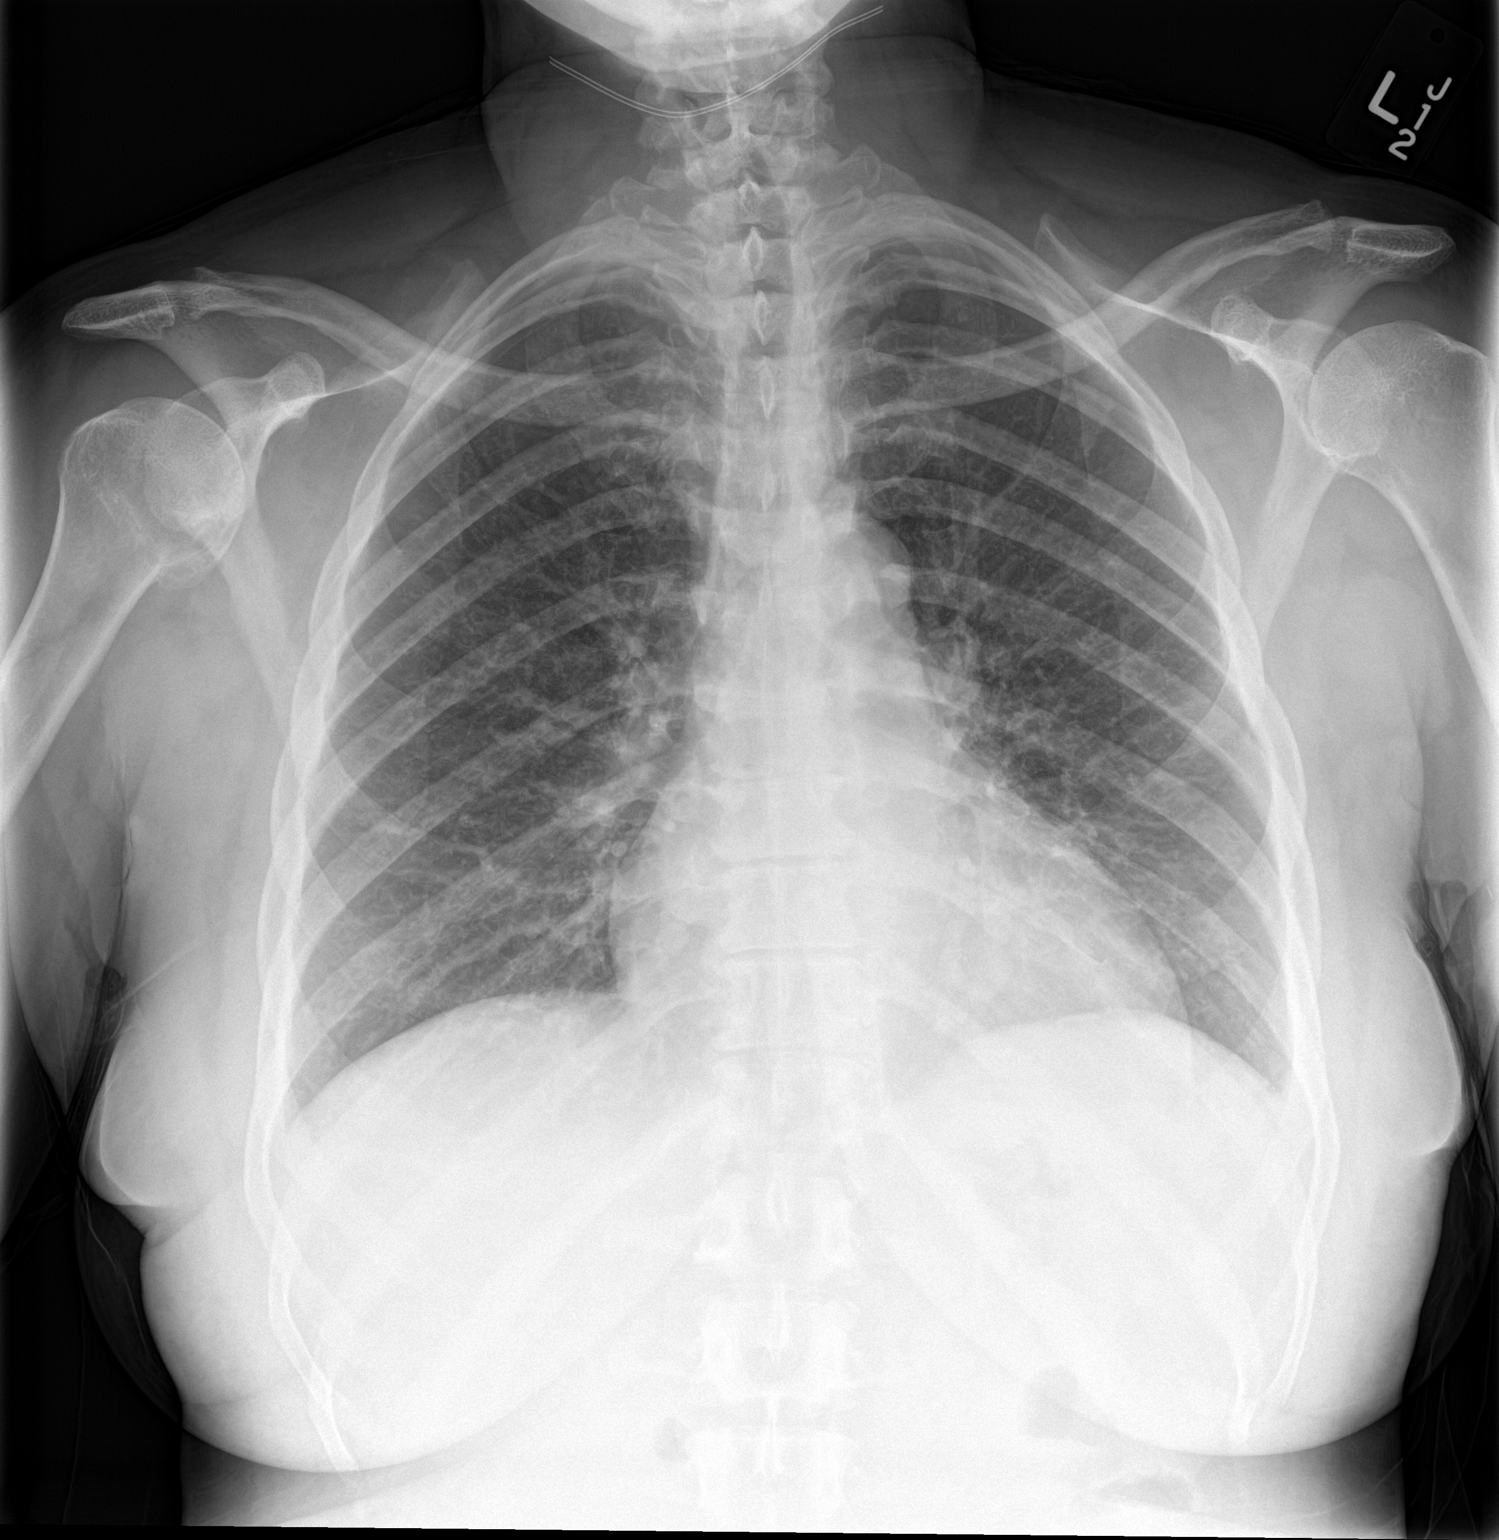

[chest lat]
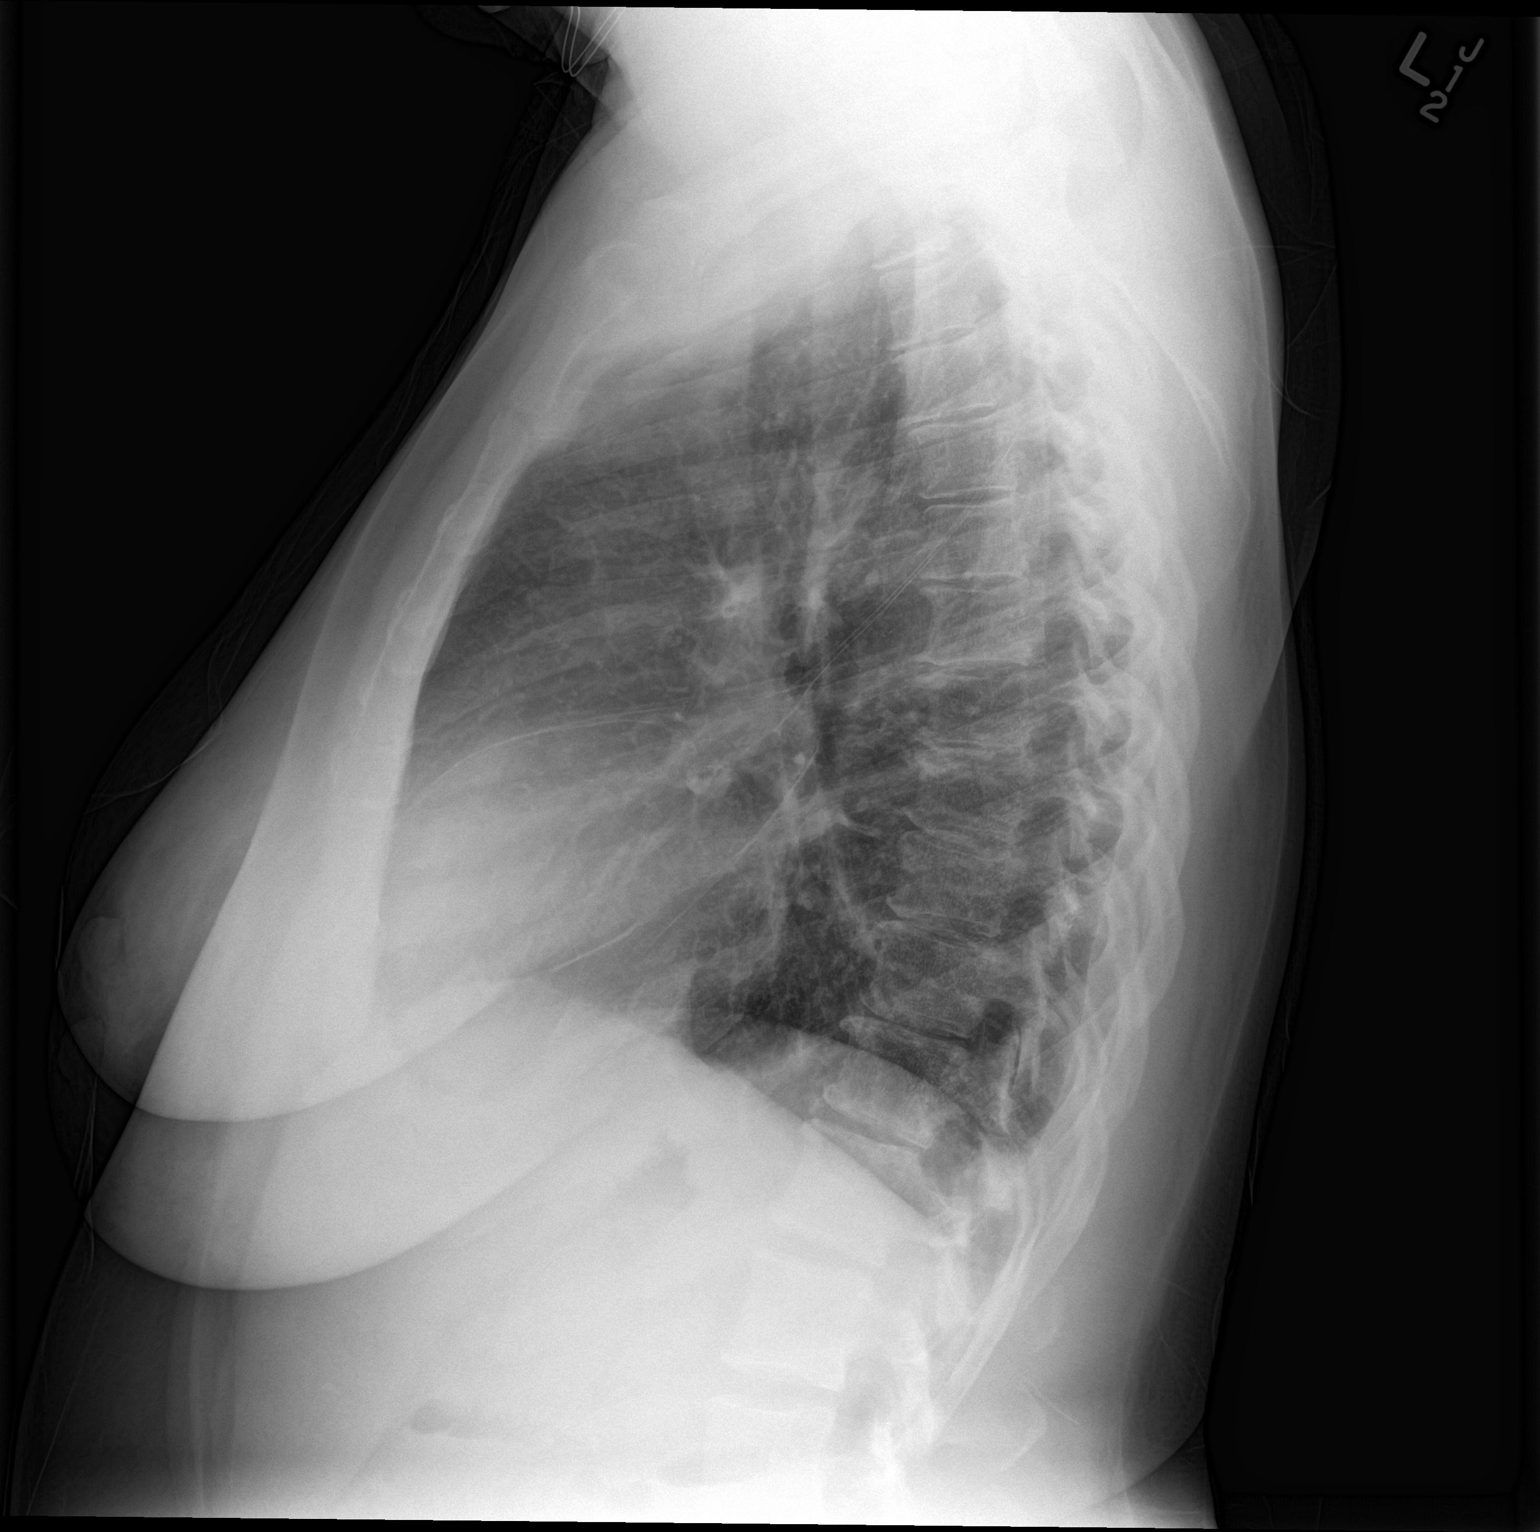

[2 of 2 positions shown; findings below may reference images not displayed]

FINDINGS: The heart size and mediastinal contours are stable. There is mild
central airway thickening. No edema, confluent airspace opacity,
pleural effusion or pneumothorax. Stable asymmetric glenohumeral
arthropathy on the right with possible underlying posttraumatic
deformity of the right humeral neck. No acute osseous findings.
IMPRESSION: Mild central airway thickening suggesting bronchitis. No evidence of
pneumonia.

## 2015-10-31 MED ORDER — ACETAMINOPHEN 325 MG PO TABS
650.0000 mg | ORAL_TABLET | Freq: Once | ORAL | Status: AC
  Administered 2015-10-31: 650 mg via ORAL
  Filled 2015-10-31: qty 2

## 2015-10-31 NOTE — ED Notes (Signed)
MD at bedside. 

## 2015-10-31 NOTE — Discharge Instructions (Signed)
Viral Infections °A viral infection can be caused by different types of viruses. Most viral infections are not serious and resolve on their own. However, some infections may cause severe symptoms and may lead to further complications. °SYMPTOMS °Viruses can frequently cause: °· Minor sore throat. °· Aches and pains. °· Headaches. °· Runny nose. °· Different types of rashes. °· Watery eyes. °· Tiredness. °· Cough. °· Loss of appetite. °· Gastrointestinal infections, resulting in nausea, vomiting, and diarrhea. °These symptoms do not respond to antibiotics because the infection is not caused by bacteria. However, you might catch a bacterial infection following the viral infection. This is sometimes called a "superinfection." Symptoms of such a bacterial infection may include: °· Worsening sore throat with pus and difficulty swallowing. °· Swollen neck glands. °· Chills and a high or persistent fever. °· Severe headache. °· Tenderness over the sinuses. °· Persistent overall ill feeling (malaise), muscle aches, and tiredness (fatigue). °· Persistent cough. °· Yellow, green, or brown mucus production with coughing. °HOME CARE INSTRUCTIONS  °· Only take over-the-counter or prescription medicines for pain, discomfort, diarrhea, or fever as directed by your caregiver. °· Drink enough water and fluids to keep your urine clear or pale yellow. Sports drinks can provide valuable electrolytes, sugars, and hydration. °· Get plenty of rest and maintain proper nutrition. Soups and broths with crackers or rice are fine. °SEEK IMMEDIATE MEDICAL CARE IF:  °· You have severe headaches, shortness of breath, chest pain, neck pain, or an unusual rash. °· You have uncontrolled vomiting, diarrhea, or you are unable to keep down fluids. °· You or your child has an oral temperature above 102° F (38.9° C), not controlled by medicine. °· Your baby is older than 3 months with a rectal temperature of 102° F (38.9° C) or higher. °· Your baby is 3  months old or younger with a rectal temperature of 100.4° F (38° C) or higher. °MAKE SURE YOU:  °· Understand these instructions. °· Will watch your condition. °· Will get help right away if you are not doing well or get worse. °  °This information is not intended to replace advice given to you by your health care provider. Make sure you discuss any questions you have with your health care provider. °  °Document Released: 04/30/2005 Document Revised: 10/13/2011 Document Reviewed: 12/27/2014 °Elsevier Interactive Patient Education ©2016 [employer] Inc[employer] ° °

## 2015-10-31 NOTE — ED Notes (Signed)
The pt is c/o abd pain  She is diaysis pt that was dialyzed earlier today.  Fistula rt arm  She has had abd pain and flu like symptoms cough clear  Elevated temp   She had an elevated temp was given tylenol at 1600 for that

## 2015-10-31 NOTE — ED Notes (Signed)
Fistula in left arm. Pt complains of stomach pain, starting on Friday. Pain present across entire upper stomach. Minimal relief. Dry cough present since Friday. States that mucous has been clear.

## 2015-10-31 NOTE — ED Provider Notes (Signed)
CSN: [id number]     Arrival date & time 10/31/15  1800 History   First MD Initiated Contact with Patient 10/31/15 2045     Chief Complaint  Patient presents with  . Abdominal Pain     HPI  50 y.o. female with history of end-stage renal disease, on dialysis Monday Wednesday Friday, who presents with 5 days of fever, generalized body aches, sore throat, dry cough, vomiting, diarrhea, abdominal pain. Patient reports that the abdominal pain is present only when coughing and is not present at baseline. MAXIMUM TEMPERATURE of 103 at home. She's been vomiting a couple of times a day. Reports the vomiting is nonbloody nonbilious. Diarrhea is described as semisolid, 2 episodes daily. No recent antibiotic use. Cough is nonproductive. Denies chest pain or shortness of breath. Sore throat seems to be improving. Patient did get a flu shot this year. She went to dialysis as scheduled today and had a full session. She is tolerating both food and fluids by mouth and is not currently nauseous.   Past Medical History  Diagnosis Date  . Deceased-donor kidney transplant     Performed at [facility], April 2010.  Initial ESRD due to HTN nephropathy  . Anemia of chronic disease   . History of hyperparathyroidism   . Hypertension   . Shortness of breath   . GERD (gastroesophageal reflux disease)   . Headache(784.0)   . Arthritis   . Wears glasses   . Eczema   . ESRD (end stage renal disease) ([place])     s/p transplant creatinine baseline 1.1  M/W/F dialysis  . FUO (fever of unknown origin) 05/17/2015   Past Surgical History  Procedure Laterality Date  . Kidney transplant  11/2008    [facility])  . Av fistula placement    . Fracture surgery      left foot,baby toe nad next toe missing  . Wisdom tooth extraction    . Bascilic vein transposition Left 10/30/2014    Procedure: LEFT BASCILIC VEIN TRANSPOSITION;  Surgeon: Rosetta Posner, MD;  Location: [facility];  Service: Vascular;  Laterality: Left;  .  Insertion of dialysis catheter Right 10/30/2014    Procedure: INSERTION OF DIALYSIS CATHETER;  Surgeon: Rosetta Posner, MD;  Location: [facility];  Service: Vascular;  Laterality: Right;  . Bascilic vein transposition Left 01/03/2015    Procedure: LEFT ARM 2ND STAGE [place];  Surgeon: Rosetta Posner, MD;  Location: [facility];  Service: Vascular;  Laterality: Left;   Family History  Problem Relation Age of Onset  . Diabetes Brother   . Deep vein thrombosis Brother   . Hypertension Mother   . Hypertension Sister   . Hyperlipidemia Sister   . Hypertension Father   . Kidney disease Brother     on HD   Social History  Substance Use Topics  . Smoking status: Current Every Day Smoker -- 0.25 packs/day for 27 years    Types: Cigarettes  . Smokeless tobacco: Never Used     Comment: 10 cigarettes a day  . Alcohol Use: No     Comment: occasional drinker noted in the past   OB History    No data available     Review of Systems  Constitutional: Positive for fever, chills and fatigue. Negative for activity change and appetite change.  HENT: Positive for sore throat. Negative for congestion and rhinorrhea.   Eyes: Negative for visual disturbance.  Respiratory: Positive for cough. Negative for shortness of breath  and wheezing.   Cardiovascular: Negative for chest pain and palpitations.  Gastrointestinal: Positive for nausea, vomiting, abdominal pain (only when coughing) and diarrhea. Negative for constipation, blood in stool and abdominal distention.  Genitourinary: Negative for dysuria, frequency and flank pain.  Musculoskeletal: Negative for myalgias, back pain, joint swelling, arthralgias, gait problem, neck pain and neck stiffness.  Skin: Negative for rash.  Neurological: Negative for dizziness, tremors, syncope, facial asymmetry, speech difficulty, weakness, numbness and headaches.  Psychiatric/Behavioral: Negative for behavioral problems, confusion and agitation.      Allergies   Penicillins  Home Medications   Prior to Admission medications   Medication Sig Start Date End Date Taking? Authorizing Provider  amLODipine (NORVASC) 10 MG tablet Take 10 mg by mouth daily.  03/01/11   Historical Provider, MD  aspirin 81 MG tablet Take 81 mg by mouth daily.     Historical Provider, MD  atenolol (TENORMIN) 50 MG tablet Take 50 mg by mouth 2 (two) times daily.  03/17/11   Historical Provider, MD  calcitRIOL (ROCALTROL) 0.25 MCG capsule Take 0.25 mcg by mouth 3 (three) times a week. Monday, Wednesday, Friday 10/03/14   Historical Provider, MD  diphenhydrAMINE (BENADRYL) 25 MG tablet Take 25 mg by mouth every 8 (eight) hours as needed (for cough).    Historical Provider, MD  docusate sodium (COLACE) 100 MG capsule Take 100 mg by mouth 2 (two) times daily as needed for constipation.     Historical Provider, MD  norethindrone (AYGESTIN) 5 MG tablet Take 5 mg by mouth 2 (two) times daily.  10/19/14   Historical Provider, MD  nystatin (MYCOSTATIN) 100000 UNIT/ML suspension Take 5 mLs by mouth 2 (two) times daily as needed (thrush).  10/19/14   Historical Provider, MD  oxyCODONE-acetaminophen (ROXICET) 5-325 MG per tablet Take 1 tablet by mouth every 6 (six) hours as needed for severe pain. 01/03/15   Alvia Grove, PA-C  sevelamer carbonate (RENVELA) 800 MG tablet Take 800 mg by mouth.    Historical Provider, MD  sodium bicarbonate 650 MG tablet Take 650 mg by mouth as needed.  10/20/14   Historical Provider, MD   BP 152/87 mmHg  Pulse 92  Temp(Src) 101.3 F (38.5 C) (Oral)  Resp 18  Ht 5\' 7"  (1.702 m)  Wt 77.593 kg  BMI 26.79 kg/m2  SpO2 92%  LMP 06/02/2015 Physical Exam  Constitutional: She is oriented to person, place, and time. She appears well-developed and well-nourished. No distress.  HENT:  Head: Normocephalic and atraumatic.  Right Ear: External ear normal.  Left Ear: External ear normal.  Nose: Nose normal.  Mouth/Throat: Oropharynx is clear and moist. No  oropharyngeal exudate.  Eyes: Conjunctivae and EOM are normal. Pupils are equal, round, and reactive to light. Right eye exhibits no discharge. Left eye exhibits no discharge.  Neck: Normal range of motion. Neck supple.  Cardiovascular: Normal rate, regular rhythm and normal heart sounds.  Exam reveals no gallop and no friction rub.   No murmur heard. Pulmonary/Chest: Effort normal and breath sounds normal. No respiratory distress. She has no wheezes. She has no rales. She exhibits no tenderness.  Abdominal: Soft. Bowel sounds are normal. She exhibits no distension and no mass. There is no tenderness. There is no rebound and no guarding.  Musculoskeletal: Normal range of motion. She exhibits no edema or tenderness.  Neurological: She is alert and oriented to person, place, and time. She exhibits normal muscle tone.  Skin: Skin is warm and dry. No rash noted.  She is not diaphoretic.  Psychiatric: She has a normal mood and affect. Her behavior is normal. Judgment and thought content normal.    ED Course  Procedures (including critical care time) Labs Review Labs Reviewed  CBC WITH DIFFERENTIAL/PLATELET - Abnormal; Notable for the following:    RBC 3.66 (*)    HCT 35.0 (*)    Platelets 127 (*)    All other components within normal limits  LIPASE, BLOOD - Abnormal; Notable for the following:    Lipase 71 (*)    All other components within normal limits  COMPREHENSIVE METABOLIC PANEL - Abnormal; Notable for the following:    Chloride 94 (*)    Creatinine, Ser 6.70 (*)    Calcium 8.2 (*)    Albumin 3.2 (*)    GFR calc non Af Amer 7 (*)    GFR calc Af Amer 8 (*)    All other components within normal limits    Imaging Review Dg Chest 2 View  10/31/2015  CLINICAL DATA:  Flu-like symptoms with abdominal pain, dry cough, shortness of breath and chest pain for 5 days. Dialysis patient. Smoker. EXAM: CHEST  2 VIEW COMPARISON:  05/01/2015 and 10/22/2014. FINDINGS: The heart size and  mediastinal contours are stable. There is mild central airway thickening. No edema, confluent airspace opacity, pleural effusion or pneumothorax. Stable asymmetric glenohumeral arthropathy on the right with possible underlying posttraumatic deformity of the right humeral neck. No acute osseous findings. IMPRESSION: Mild central airway thickening suggesting bronchitis. No evidence of pneumonia. Electronically Signed   By: Richardean Sale M.D.   On: 10/31/2015 19:20   I have personally reviewed and evaluated these images and lab results as part of my medical decision-making.   EKG Interpretation None      MDM   Final diagnoses:  Viral illness    The patient is generally well-appearing though febrile on arrival. Vital signs stable. Abdominal exam benign and lungs clear to auscultation bilaterally. Chest x-ray suspicious for bronchitis but no focal consolidation to suggest pneumonia. No leukocytosis. CMP at baseline, with no indication for emergent dialysis. Lipase mildly elevated to 71 but abdominal exam is benign and patient is tolerating oral intake. She may have mild pancreatitis, though given her myalgias, sore throat, fever, dry cough I am more suspicious for an influenza-like virus. Doubt serious bacterial illness. Recommend supportive treatment. Patient refuses antiemetics. We'll discharge him with instructions to follow-up with her PCP in 3 days should symptoms persist. Stable for discharge home with precautions given to return to the ED for inability to tolerate oral intake.    Zipporah Plants, MD 11/01/15 [phone]

## 2015-11-02 DIAGNOSIS — N186 End stage renal disease: Secondary | ICD-10-CM | POA: Diagnosis not present

## 2015-11-02 DIAGNOSIS — E1129 Type 2 diabetes mellitus with other diabetic kidney complication: Secondary | ICD-10-CM | POA: Diagnosis not present

## 2015-11-02 DIAGNOSIS — I129 Hypertensive chronic kidney disease with stage 1 through stage 4 chronic kidney disease, or unspecified chronic kidney disease: Secondary | ICD-10-CM | POA: Diagnosis not present

## 2015-11-02 DIAGNOSIS — Z992 Dependence on renal dialysis: Secondary | ICD-10-CM | POA: Diagnosis not present

## 2015-11-02 DIAGNOSIS — R509 Fever, unspecified: Secondary | ICD-10-CM | POA: Diagnosis not present

## 2015-11-02 DIAGNOSIS — D509 Iron deficiency anemia, unspecified: Secondary | ICD-10-CM | POA: Diagnosis not present

## 2015-11-02 DIAGNOSIS — N2581 Secondary hyperparathyroidism of renal origin: Secondary | ICD-10-CM | POA: Diagnosis not present

## 2015-11-02 DIAGNOSIS — D631 Anemia in chronic kidney disease: Secondary | ICD-10-CM | POA: Diagnosis not present

## 2015-11-05 DIAGNOSIS — N186 End stage renal disease: Secondary | ICD-10-CM | POA: Diagnosis not present

## 2015-11-05 DIAGNOSIS — E1129 Type 2 diabetes mellitus with other diabetic kidney complication: Secondary | ICD-10-CM | POA: Diagnosis not present

## 2015-11-05 DIAGNOSIS — N2581 Secondary hyperparathyroidism of renal origin: Secondary | ICD-10-CM | POA: Diagnosis not present

## 2015-11-05 DIAGNOSIS — R509 Fever, unspecified: Secondary | ICD-10-CM | POA: Diagnosis not present

## 2015-11-05 DIAGNOSIS — D509 Iron deficiency anemia, unspecified: Secondary | ICD-10-CM | POA: Diagnosis not present

## 2015-11-05 DIAGNOSIS — D631 Anemia in chronic kidney disease: Secondary | ICD-10-CM | POA: Diagnosis not present

## 2015-11-07 DIAGNOSIS — N2581 Secondary hyperparathyroidism of renal origin: Secondary | ICD-10-CM | POA: Diagnosis not present

## 2015-11-07 DIAGNOSIS — D509 Iron deficiency anemia, unspecified: Secondary | ICD-10-CM | POA: Diagnosis not present

## 2015-11-07 DIAGNOSIS — N186 End stage renal disease: Secondary | ICD-10-CM | POA: Diagnosis not present

## 2015-11-07 DIAGNOSIS — E1129 Type 2 diabetes mellitus with other diabetic kidney complication: Secondary | ICD-10-CM | POA: Diagnosis not present

## 2015-11-07 DIAGNOSIS — D631 Anemia in chronic kidney disease: Secondary | ICD-10-CM | POA: Diagnosis not present

## 2015-11-07 DIAGNOSIS — R509 Fever, unspecified: Secondary | ICD-10-CM | POA: Diagnosis not present

## 2015-11-09 DIAGNOSIS — R509 Fever, unspecified: Secondary | ICD-10-CM | POA: Diagnosis not present

## 2015-11-09 DIAGNOSIS — D509 Iron deficiency anemia, unspecified: Secondary | ICD-10-CM | POA: Diagnosis not present

## 2015-11-09 DIAGNOSIS — D631 Anemia in chronic kidney disease: Secondary | ICD-10-CM | POA: Diagnosis not present

## 2015-11-09 DIAGNOSIS — N186 End stage renal disease: Secondary | ICD-10-CM | POA: Diagnosis not present

## 2015-11-09 DIAGNOSIS — N2581 Secondary hyperparathyroidism of renal origin: Secondary | ICD-10-CM | POA: Diagnosis not present

## 2015-11-09 DIAGNOSIS — E1129 Type 2 diabetes mellitus with other diabetic kidney complication: Secondary | ICD-10-CM | POA: Diagnosis not present

## 2015-11-12 ENCOUNTER — Encounter (HOSPITAL_COMMUNITY): Payer: Self-pay | Admitting: Emergency Medicine

## 2015-11-12 ENCOUNTER — Inpatient Hospital Stay (HOSPITAL_COMMUNITY)
Admission: EM | Admit: 2015-11-12 | Discharge: 2015-11-16 | DRG: 871 | Disposition: A | Discharge status: Home / Self Care | Payer: Medicare Other | Attending: Internal Medicine | Admitting: Internal Medicine

## 2015-11-12 ENCOUNTER — Emergency Department (HOSPITAL_COMMUNITY): Payer: Medicare Other

## 2015-11-12 DIAGNOSIS — R509 Fever, unspecified: Secondary | ICD-10-CM | POA: Diagnosis not present

## 2015-11-12 DIAGNOSIS — Z7982 Long term (current) use of aspirin: Secondary | ICD-10-CM | POA: Diagnosis not present

## 2015-11-12 DIAGNOSIS — J159 Unspecified bacterial pneumonia: Secondary | ICD-10-CM | POA: Diagnosis present

## 2015-11-12 DIAGNOSIS — A419 Sepsis, unspecified organism: Secondary | ICD-10-CM | POA: Diagnosis not present

## 2015-11-12 DIAGNOSIS — J189 Pneumonia, unspecified organism: Secondary | ICD-10-CM | POA: Diagnosis not present

## 2015-11-12 DIAGNOSIS — D638 Anemia in other chronic diseases classified elsewhere: Secondary | ICD-10-CM | POA: Diagnosis present

## 2015-11-12 DIAGNOSIS — K219 Gastro-esophageal reflux disease without esophagitis: Secondary | ICD-10-CM | POA: Diagnosis present

## 2015-11-12 DIAGNOSIS — Z992 Dependence on renal dialysis: Secondary | ICD-10-CM | POA: Diagnosis not present

## 2015-11-12 DIAGNOSIS — R05 Cough: Secondary | ICD-10-CM | POA: Diagnosis not present

## 2015-11-12 DIAGNOSIS — N186 End stage renal disease: Secondary | ICD-10-CM | POA: Diagnosis present

## 2015-11-12 DIAGNOSIS — F17210 Nicotine dependence, cigarettes, uncomplicated: Secondary | ICD-10-CM | POA: Diagnosis not present

## 2015-11-12 DIAGNOSIS — I10 Essential (primary) hypertension: Secondary | ICD-10-CM | POA: Diagnosis present

## 2015-11-12 DIAGNOSIS — R112 Nausea with vomiting, unspecified: Secondary | ICD-10-CM | POA: Diagnosis not present

## 2015-11-12 DIAGNOSIS — Y95 Nosocomial condition: Secondary | ICD-10-CM | POA: Diagnosis present

## 2015-11-12 DIAGNOSIS — E213 Hyperparathyroidism, unspecified: Secondary | ICD-10-CM | POA: Diagnosis present

## 2015-11-12 DIAGNOSIS — D509 Iron deficiency anemia, unspecified: Secondary | ICD-10-CM | POA: Diagnosis not present

## 2015-11-12 DIAGNOSIS — D72829 Elevated white blood cell count, unspecified: Secondary | ICD-10-CM | POA: Diagnosis not present

## 2015-11-12 DIAGNOSIS — D631 Anemia in chronic kidney disease: Secondary | ICD-10-CM | POA: Diagnosis not present

## 2015-11-12 DIAGNOSIS — I120 Hypertensive chronic kidney disease with stage 5 chronic kidney disease or end stage renal disease: Secondary | ICD-10-CM | POA: Diagnosis present

## 2015-11-12 DIAGNOSIS — N2581 Secondary hyperparathyroidism of renal origin: Secondary | ICD-10-CM | POA: Diagnosis present

## 2015-11-12 DIAGNOSIS — T8612 Kidney transplant failure: Secondary | ICD-10-CM | POA: Diagnosis present

## 2015-11-12 DIAGNOSIS — E1129 Type 2 diabetes mellitus with other diabetic kidney complication: Secondary | ICD-10-CM | POA: Diagnosis not present

## 2015-11-12 DIAGNOSIS — R197 Diarrhea, unspecified: Secondary | ICD-10-CM | POA: Diagnosis not present

## 2015-11-12 LAB — I-STAT BETA HCG BLOOD, ED (MC, WL, AP ONLY)

## 2015-11-12 LAB — COMPREHENSIVE METABOLIC PANEL
ALK PHOS: 138 U/L — AB (ref 38–126)
ALT: 52 U/L (ref 14–54)
ANION GAP: 14 (ref 5–15)
AST: 36 U/L (ref 15–41)
Albumin: 3.1 g/dL — ABNORMAL LOW (ref 3.5–5.0)
BILIRUBIN TOTAL: 0.8 mg/dL (ref 0.3–1.2)
BUN: 13 mg/dL (ref 6–20)
CALCIUM: 8.6 mg/dL — AB (ref 8.9–10.3)
CO2: 31 mmol/L (ref 22–32)
CREATININE: 5.35 mg/dL — AB (ref 0.44–1.00)
Chloride: 92 mmol/L — ABNORMAL LOW (ref 101–111)
GFR, EST AFRICAN AMERICAN: 10 mL/min — AB (ref >60)
GFR, EST NON AFRICAN AMERICAN: 9 mL/min — AB (ref >60)
Glucose, Bld: 117 mg/dL — ABNORMAL HIGH (ref 65–99)
Potassium: 3.8 mmol/L (ref 3.5–5.1)
Sodium: 137 mmol/L (ref 135–145)
TOTAL PROTEIN: 7.6 g/dL (ref 6.5–8.1)

## 2015-11-12 LAB — CBC WITH DIFFERENTIAL/PLATELET
BASOS PCT: 0 %
Basophils Absolute: 0 10*3/uL (ref 0.0–0.1)
EOS PCT: 1 %
Eosinophils Absolute: 0.1 10*3/uL (ref 0.0–0.7)
HCT: 29.7 % — ABNORMAL LOW (ref 36.0–46.0)
Hemoglobin: 9.8 g/dL — ABNORMAL LOW (ref 12.0–15.0)
Lymphocytes Relative: 7 %
Lymphs Abs: 0.8 10*3/uL (ref 0.7–4.0)
MCH: 31.1 pg (ref 26.0–34.0)
MCHC: 33 g/dL (ref 30.0–36.0)
MCV: 94.3 fL (ref 78.0–100.0)
MONO ABS: 0.5 10*3/uL (ref 0.1–1.0)
MONOS PCT: 4 %
NEUTROS ABS: 10 10*3/uL — AB (ref 1.7–7.7)
Neutrophils Relative %: 88 %
PLATELETS: 243 10*3/uL (ref 150–400)
RBC: 3.15 MIL/uL — ABNORMAL LOW (ref 3.87–5.11)
RDW: 14.1 % (ref 11.5–15.5)
WBC: 11.4 10*3/uL — ABNORMAL HIGH (ref 4.0–10.5)

## 2015-11-12 LAB — I-STAT CG4 LACTIC ACID, ED
LACTIC ACID, VENOUS: 1.02 mmol/L (ref 0.5–2.0)
LACTIC ACID, VENOUS: 0.84 mmol/L (ref 0.5–2.0)

## 2015-11-12 IMAGING — DX DG CHEST 2V
2 series · 2 of 2 positions shown · non-contrast
Comparison: [DATE]

CLINICAL DATA: Cough and fever. Flu-like symptoms for a week.
Chills.

EXAM:
CHEST  2 VIEW

[chest pa]
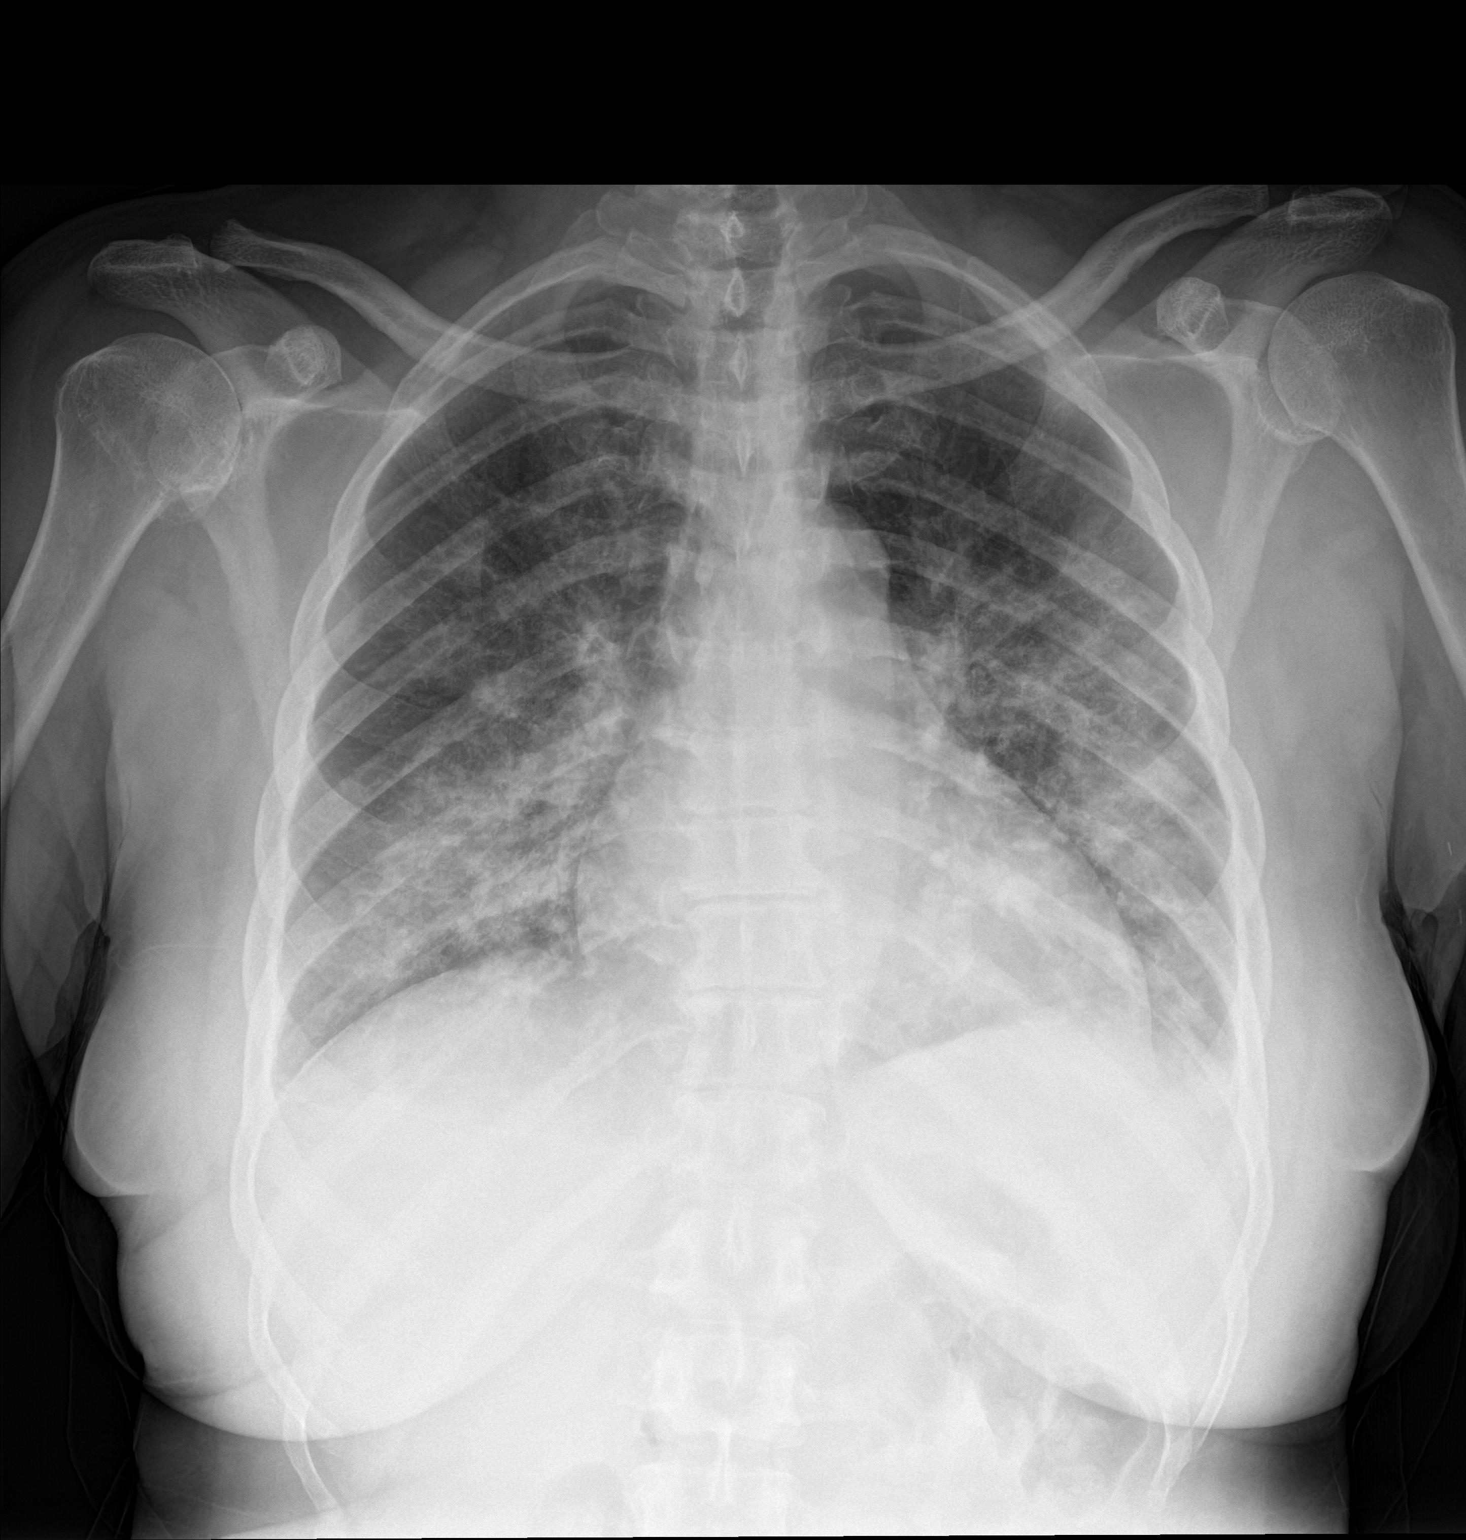

[chest lat]
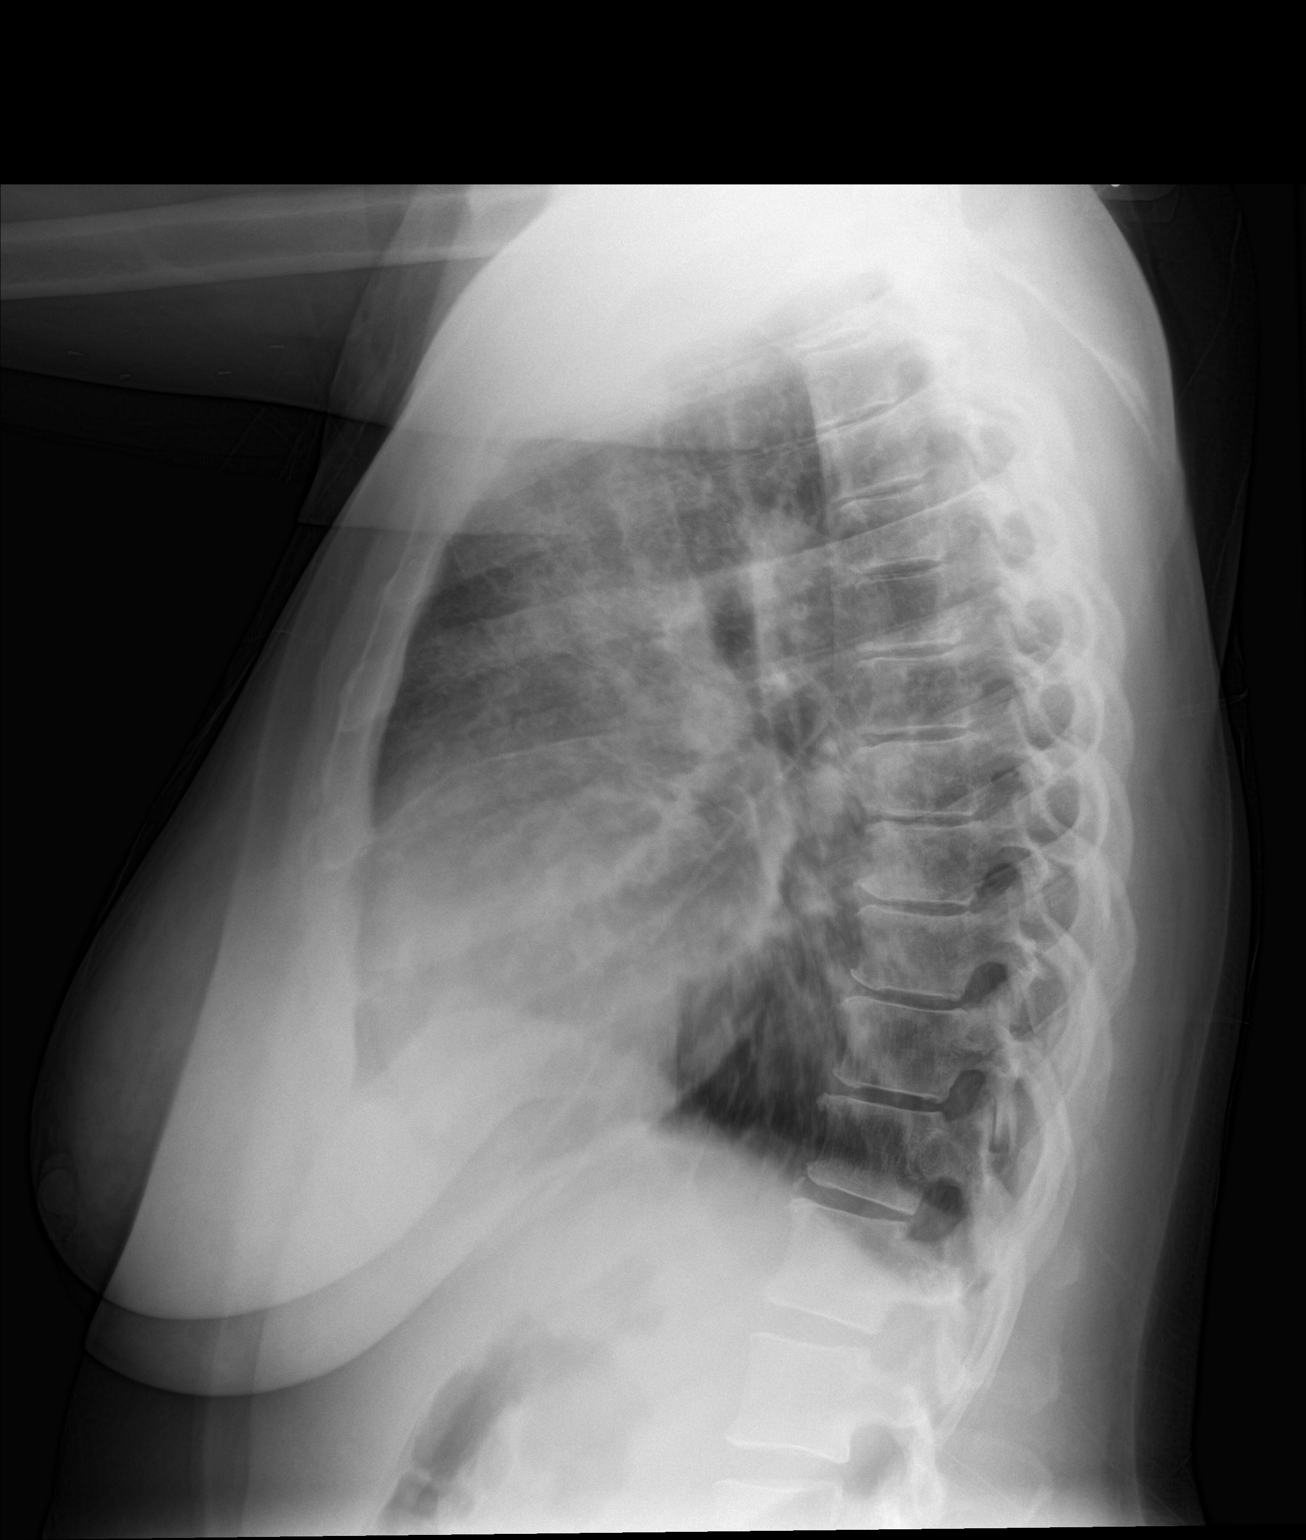

[2 of 2 positions shown; findings below may reference images not displayed]

FINDINGS: Patchy bilateral airspace disease consistent with pneumonia given
the history. Borderline cardiomegaly. Negative aortic and hilar
contours. There may be trace left pleural effusion. No cavitary
findings.
IMPRESSION: Multi lobar bilateral pneumonia.

## 2015-11-12 MED ORDER — ACETAMINOPHEN 325 MG PO TABS
650.0000 mg | ORAL_TABLET | Freq: Once | ORAL | Status: AC | PRN
  Administered 2015-11-12: 650 mg via ORAL

## 2015-11-12 MED ORDER — SODIUM CHLORIDE 0.9 % IV BOLUS (SEPSIS)
1000.0000 mL | INTRAVENOUS | Status: DC
  Administered 2015-11-12: 1000 mL via INTRAVENOUS

## 2015-11-12 MED ORDER — SODIUM CHLORIDE 0.9% FLUSH
3.0000 mL | Freq: Two times a day (BID) | INTRAVENOUS | Status: DC
  Administered 2015-11-13 – 2015-11-16 (×7): 3 mL via INTRAVENOUS

## 2015-11-12 MED ORDER — ASPIRIN EC 81 MG PO TBEC
81.0000 mg | DELAYED_RELEASE_TABLET | Freq: Every day | ORAL | Status: DC
  Administered 2015-11-13 – 2015-11-16 (×4): 81 mg via ORAL
  Filled 2015-11-12 (×4): qty 1

## 2015-11-12 MED ORDER — AMLODIPINE BESYLATE 10 MG PO TABS
10.0000 mg | ORAL_TABLET | Freq: Every day | ORAL | Status: DC
  Administered 2015-11-13: 10 mg via ORAL
  Filled 2015-11-12 (×2): qty 1

## 2015-11-12 MED ORDER — SODIUM CHLORIDE 0.9 % IV SOLN: 250.0000 mL | INTRAVENOUS | Status: DC | PRN

## 2015-11-12 MED ORDER — HEPARIN SODIUM (PORCINE) 5000 UNIT/ML IJ SOLN
5000.0000 [IU] | Freq: Three times a day (TID) | INTRAMUSCULAR | Status: DC
  Filled 2015-11-12 (×3): qty 1

## 2015-11-12 MED ORDER — CALCIUM ACETATE (PHOS BINDER) 667 MG PO CAPS
3335.0000 mg | ORAL_CAPSULE | Freq: Three times a day (TID) | ORAL | Status: DC
  Administered 2015-11-13 (×2): 3335 mg via ORAL
  Filled 2015-11-12 (×2): qty 5

## 2015-11-12 MED ORDER — DEXTROSE 5 % IV SOLN
2.0000 g | INTRAVENOUS | Status: DC
  Administered 2015-11-14: 2 g via INTRAVENOUS
  Filled 2015-11-12 (×3): qty 2

## 2015-11-12 MED ORDER — VANCOMYCIN HCL IN DEXTROSE 750-5 MG/150ML-% IV SOLN: 750.0000 mg | INTRAVENOUS | Status: DC

## 2015-11-12 MED ORDER — LISINOPRIL 20 MG PO TABS
20.0000 mg | ORAL_TABLET | Freq: Every evening | ORAL | Status: DC
  Administered 2015-11-12 – 2015-11-15 (×4): 20 mg via ORAL
  Filled 2015-11-12 (×5): qty 1

## 2015-11-12 MED ORDER — DEXTROSE 5 % IV SOLN
2.0000 g | Freq: Once | INTRAVENOUS | Status: AC
  Administered 2015-11-12: 2 g via INTRAVENOUS
  Filled 2015-11-12: qty 2

## 2015-11-12 MED ORDER — ACETAMINOPHEN 325 MG PO TABS
650.0000 mg | ORAL_TABLET | Freq: Four times a day (QID) | ORAL | Status: DC | PRN
  Administered 2015-11-12 – 2015-11-14 (×6): 650 mg via ORAL
  Filled 2015-11-12 (×6): qty 2

## 2015-11-12 MED ORDER — CALCIUM ACETATE (PHOS BINDER) 667 MG PO CAPS
1334.0000 mg | ORAL_CAPSULE | ORAL | Status: DC
  Administered 2015-11-13: 1334 mg via ORAL
  Filled 2015-11-12: qty 2

## 2015-11-12 MED ORDER — VANCOMYCIN HCL 10 G IV SOLR
1500.0000 mg | Freq: Once | INTRAVENOUS | Status: AC
  Administered 2015-11-12: 1500 mg via INTRAVENOUS
  Filled 2015-11-12: qty 1500

## 2015-11-12 MED ORDER — CALCIUM ACETATE 667 MG PO CAPS: 3335.0000 mg | ORAL_CAPSULE | Freq: Three times a day (TID) | ORAL | Status: DC

## 2015-11-12 MED ORDER — DICLOFENAC SODIUM 1 % TD GEL
4.0000 g | Freq: Four times a day (QID) | TRANSDERMAL | Status: DC
  Administered 2015-11-13 – 2015-11-16 (×5): 4 g via TOPICAL
  Filled 2015-11-12 (×2): qty 100

## 2015-11-12 MED ORDER — NORETHINDRONE ACETATE 5 MG PO TABS
5.0000 mg | ORAL_TABLET | Freq: Two times a day (BID) | ORAL | Status: DC
  Administered 2015-11-13 – 2015-11-16 (×7): 5 mg via ORAL
  Filled 2015-11-12 (×8): qty 1

## 2015-11-12 MED ORDER — VANCOMYCIN HCL IN DEXTROSE 1-5 GM/200ML-% IV SOLN: 1000.0000 mg | Freq: Once | INTRAVENOUS | Status: DC

## 2015-11-12 MED ORDER — SODIUM CHLORIDE 0.9 % IV BOLUS (SEPSIS)
500.0000 mL | INTRAVENOUS | Status: AC
  Administered 2015-11-12: 500 mL via INTRAVENOUS

## 2015-11-12 MED ORDER — ATENOLOL 50 MG PO TABS
50.0000 mg | ORAL_TABLET | Freq: Two times a day (BID) | ORAL | Status: DC
  Administered 2015-11-12 – 2015-11-16 (×8): 50 mg via ORAL
  Filled 2015-11-12 (×8): qty 1

## 2015-11-12 MED ORDER — SODIUM CHLORIDE 0.9% FLUSH: 3.0000 mL | INTRAVENOUS | Status: DC | PRN

## 2015-11-12 MED ORDER — ACETAMINOPHEN 325 MG PO TABS
ORAL_TABLET | ORAL | Status: AC
Start: 2015-11-12 — End: 2015-11-13
  Filled 2015-11-12: qty 2

## 2015-11-12 MED ORDER — IPRATROPIUM-ALBUTEROL 0.5-2.5 (3) MG/3ML IN SOLN
3.0000 mL | RESPIRATORY_TRACT | Status: DC | PRN
  Administered 2015-11-13 (×2): 3 mL via RESPIRATORY_TRACT
  Filled 2015-11-12 (×2): qty 3

## 2015-11-12 NOTE — ED Provider Notes (Signed)
CSN: [id number]     Arrival date & time 11/12/15  1619 History   First MD Initiated Contact with Patient 11/12/15 2020     Chief Complaint  Patient presents with  . Cough  . Chills  . Fever     (Consider location/radiation/quality/duration/timing/severity/associated sxs/prior Treatment) Patient is a 50 y.o. female presenting with cough. The history is provided by the patient.  Cough Cough characteristics:  Productive Sputum characteristics:  Nondescript Severity:  Moderate Onset quality:  Gradual Duration:  2 weeks Timing:  Constant Progression:  Worsening Chronicity:  Recurrent Context: upper respiratory infection   Relieved by:  Nothing Worsened by:  Nothing tried Ineffective treatments:  None tried Associated symptoms: chills, diaphoresis, fever (over last 3 days, recurrent) and shortness of breath   Associated symptoms: no chest pain   Risk factors: recent infection (viral URI, resolved fever but persistent cough, fever returned with increased ShOB)     Past Medical History  Diagnosis Date  . Deceased-donor kidney transplant     Performed at [facility], April 2010.  Initial ESRD due to HTN nephropathy  . Anemia of chronic disease   . History of hyperparathyroidism   . Hypertension   . Shortness of breath   . GERD (gastroesophageal reflux disease)   . Headache(784.0)   . Arthritis   . Wears glasses   . Eczema   . ESRD (end stage renal disease) ([place])     s/p transplant creatinine baseline 1.1  M/W/F dialysis  . FUO (fever of unknown origin) 05/17/2015   Past Surgical History  Procedure Laterality Date  . Kidney transplant  11/2008    [facility])  . Av fistula placement    . Fracture surgery      left foot,baby toe nad next toe missing  . Wisdom tooth extraction    . Bascilic vein transposition Left 10/30/2014    Procedure: LEFT BASCILIC VEIN TRANSPOSITION;  Surgeon: Rosetta Posner, MD;  Location: [place];  Service: Vascular;  Laterality: Left;  .  Insertion of dialysis catheter Right 10/30/2014    Procedure: INSERTION OF DIALYSIS CATHETER;  Surgeon: Rosetta Posner, MD;  Location: [place];  Service: Vascular;  Laterality: Right;  . Bascilic vein transposition Left 01/03/2015    Procedure: LEFT ARM 2ND STAGE [place];  Surgeon: Rosetta Posner, MD;  Location: [facility] OR;  Service: Vascular;  Laterality: Left;   Family History  Problem Relation Age of Onset  . Diabetes Brother   . Deep vein thrombosis Brother   . Hypertension Mother   . Hypertension Sister   . Hyperlipidemia Sister   . Hypertension Father   . Kidney disease Brother     on HD   Social History  Substance Use Topics  . Smoking status: Current Every Day Smoker -- 0.25 packs/day for 27 years    Types: Cigarettes  . Smokeless tobacco: Never Used     Comment: 10 cigarettes a day  . Alcohol Use: No     Comment: occasional drinker noted in the past   OB History    No data available     Review of Systems  Constitutional: Positive for fever (over last 3 days, recurrent), chills and diaphoresis.  Respiratory: Positive for cough and shortness of breath.   Cardiovascular: Negative for chest pain.  All other systems reviewed and are negative.     Allergies  Penicillins  Home Medications   Prior to Admission medications   Medication Sig Start Date End Date  Taking? Authorizing Provider  amLODipine (NORVASC) 10 MG tablet Take 10 mg by mouth daily.  03/01/11   Historical Provider, MD  aspirin 81 MG tablet Take 81 mg by mouth daily.     Historical Provider, MD  atenolol (TENORMIN) 50 MG tablet Take 50 mg by mouth 2 (two) times daily.  03/17/11   Historical Provider, MD  calcitRIOL (ROCALTROL) 0.25 MCG capsule Take 0.25 mcg by mouth 3 (three) times a week. Monday, Wednesday, Friday 10/03/14   Historical Provider, MD  diphenhydrAMINE (BENADRYL) 25 MG tablet Take 25 mg by mouth every 8 (eight) hours as needed (for cough).    Historical Provider, MD  docusate sodium  (COLACE) 100 MG capsule Take 100 mg by mouth 2 (two) times daily as needed for constipation.     Historical Provider, MD  norethindrone (AYGESTIN) 5 MG tablet Take 5 mg by mouth 2 (two) times daily.  10/19/14   Historical Provider, MD  nystatin (MYCOSTATIN) 100000 UNIT/ML suspension Take 5 mLs by mouth 2 (two) times daily as needed (thrush).  10/19/14   Historical Provider, MD  oxyCODONE-acetaminophen (ROXICET) 5-325 MG per tablet Take 1 tablet by mouth every 6 (six) hours as needed for severe pain. 01/03/15   Alvia Grove, PA-C  sevelamer carbonate (RENVELA) 800 MG tablet Take 800 mg by mouth.    Historical Provider, MD  sodium bicarbonate 650 MG tablet Take 650 mg by mouth as needed.  10/20/14   Historical Provider, MD   BP 166/90 mmHg  Pulse 93  Temp(Src) 102.7 F (39.3 C) (Oral)  Resp 18  Ht 5\' 7"  (1.702 m)  Wt 171 lb (77.565 kg)  BMI 26.78 kg/m2  SpO2 97%  LMP 06/02/2015 Physical Exam  Constitutional: She is oriented to person, place, and time. She appears well-developed and well-nourished. She appears ill. No distress.  HENT:  Head: Normocephalic.  Eyes: Conjunctivae are normal.  Neck: Neck supple. No tracheal deviation present.  Cardiovascular: Regular rhythm and normal heart sounds.  Tachycardia present.   Pulmonary/Chest: Effort normal. No accessory muscle usage. Tachypnea noted. She has wheezes. She has rales (diffuse).  Abdominal: Soft. She exhibits no distension. There is no tenderness.  Neurological: She is alert and oriented to person, place, and time.  Skin: Skin is warm and dry.  Psychiatric: She has a normal mood and affect.    ED Course  Procedures (including critical care time)  CRITICAL CARE Performed by: Leo Grosser Total critical care time: 30 minutes Critical care time was exclusive of separately billable procedures and treating other patients. Critical care was necessary to treat or prevent imminent or life-threatening deterioration. Critical care was  time spent personally by me on the following activities: development of treatment plan with patient and/or surrogate as well as nursing, discussions with consultants, evaluation of patient's response to treatment, examination of patient, obtaining history from patient or surrogate, ordering and performing treatments and interventions, ordering and review of laboratory studies, ordering and review of radiographic studies, pulse oximetry and re-evaluation of patient's condition.   Labs Review Labs Reviewed  COMPREHENSIVE METABOLIC PANEL - Abnormal; Notable for the following:    Chloride 92 (*)    Glucose, Bld 117 (*)    Creatinine, Ser 5.35 (*)    Calcium 8.6 (*)    Albumin 3.1 (*)    Alkaline Phosphatase 138 (*)    GFR calc non Af Amer 9 (*)    GFR calc Af Amer 10 (*)    All other components within normal  limits  CBC WITH DIFFERENTIAL/PLATELET - Abnormal; Notable for the following:    WBC 11.4 (*)    RBC 3.15 (*)    Hemoglobin 9.8 (*)    HCT 29.7 (*)    Neutro Abs 10.0 (*)    All other components within normal limits  CULTURE, BLOOD (ROUTINE X 2)  CULTURE, BLOOD (ROUTINE X 2)  URINALYSIS, ROUTINE W REFLEX MICROSCOPIC (NOT AT [facility])  INFLUENZA PANEL BY PCR (TYPE A & B, H1N1)  I-STAT BETA HCG BLOOD, ED ([facility], [facility], [facility] ONLY)  I-STAT CG4 LACTIC ACID, ED  I-STAT CG4 LACTIC ACID, ED    Imaging Review Dg Chest 2 View  11/12/2015  CLINICAL DATA:  Cough and fever. Flu-like symptoms for a week. Chills. EXAM: CHEST  2 VIEW COMPARISON:  10/31/2015 FINDINGS: Patchy bilateral airspace disease consistent with pneumonia given the history. Borderline cardiomegaly. Negative aortic and hilar contours. There may be trace left pleural effusion. No cavitary findings. IMPRESSION: Multi lobar bilateral pneumonia. Electronically Signed   By: Monte Fantasia M.D.   On: 11/12/2015 17:04   I have personally reviewed and evaluated these images and lab results as part of my medical decision-making.   EKG  Interpretation None      MDM   Final diagnoses:  Sepsis, due to unspecified organism ([place])  HCAP (healthcare-associated pneumonia)    50 y.o. female presents with cough, fever over last 3 days. Has had ongoing cough and recent diagnosis of bronchitis. Symptoms and clinical appearance today are of superimposed bacterial pneumonia and sepsis. MWF dialysis Pt, high risk for nosocomial infection, covered with Vanc and zosyn. CXR c/w multifocal pneumonia. Multiple fluid boluses ordered for sepsis resuscitation. Internal medicine was consulted for admission and will see the patient in the emergency department.     Leo Grosser, MD 11/13/15 [phone]

## 2015-11-12 NOTE — H&P (Signed)
Date: 11/12/2015               Patient Name:  Ruth Gutierrez MRN: [id number]  DOB: Apr 17, 1966 Age / Sex: 50 y.o., female   PCP: Mauricia Area, MD         Medical Service: Internal Medicine Teaching Service         Attending Physician: Dr. Bartholomew Crews, MD    First Contact: Dr. Lindon Romp Pager: [phone]  Second Contact: Dr. Albin Felling Pager: [phone]       After Hours (After 5p/  First Contact Pager: [phone]  weekends / holidays): Second Contact Pager: [phone]   Chief Complaint: Cough, shortness of breath, fevers  History of Present Illness: Ruth Gutierrez is a 50yo current smoker with ESRD 2/2 HTN s/p DDKT in 2010 now failed and back on MWF HD since 3/16 who presents with cough and shortness of breath and subjective fevers for the past 2 weeks. She initially presented to the ED on 10/31/15 with flu-like symptoms including fevers, myalgias, cough, SOB, N/V/D. She was sent home and was started on antibiotics at her HD center until this past Wednesday. Her symptoms improved significantly while on antibiotics, but a few days after stopping them, since 3 days ago, she has begun spiking fevers to 103, as well as having cough productive of clear sputum, worsening shortness of breath with wheezing, myalgias, and generalized abdominal pain. She also notes some rib pain when she takes in deep breaths after coughing spells. She also says she is having pain with urination since yesterday, which is new. She denies chest pain, palpitations, vomiting, diarrhea, urinary frequency/urgency/hematuria/odor, focal weakness/numbness, worsening leg swelling, or any new rashes. She says "everyone in the house has a cold". She also continues to smoke 1/2 ppd, but no one else in the house smokes. She denies EtOH abuse or other recreational drug use.  Meds: Current Facility-Administered Medications  Medication Dose Route Frequency Provider Last Rate Last Dose  . 0.9 %  sodium chloride infusion  250 mL  Intravenous PRN Milagros Loll, MD      . acetaminophen (TYLENOL) 325 MG tablet           . acetaminophen (TYLENOL) tablet 650 mg  650 mg Oral Q6H PRN Milagros Loll, MD      . Derrill Memo ON 11/13/2015] amLODipine (NORVASC) tablet 10 mg  10 mg Oral Daily Milagros Loll, MD      . Derrill Memo ON 11/13/2015] aspirin EC tablet 81 mg  81 mg Oral Daily Milagros Loll, MD      . atenolol (TENORMIN) tablet 50 mg  50 mg Oral BID Milagros Loll, MD      . Derrill Memo ON 11/13/2015] calcium acetate (PHOSLO) capsule 3,335 mg  3,335 mg Oral TID WC Bartholomew Crews, MD       And  . Derrill Memo ON 11/13/2015] calcium acetate (PHOSLO) capsule 1,334 mg  1,334 mg Oral With snacks Bartholomew Crews, MD      . Derrill Memo ON 11/14/2015] ceFEPIme (MAXIPIME) 2 g in dextrose 5 % 50 mL IVPB  2 g Intravenous Q M,W,F-1800 Rachel L Rumbarger, RPH      . diclofenac sodium (VOLTAREN) 1 % transdermal gel 4 g  4 g Topical QID Milagros Loll, MD      . heparin injection 5,000 Units  5,000 Units Subcutaneous 3 times per day Milagros Loll, MD      . ipratropium-albuterol (DUONEB) 0.5-2.5 (3) MG/3ML nebulizer solution  3 mL  3 mL Nebulization Q2H PRN Milagros Loll, MD      . lisinopril (PRINIVIL,ZESTRIL) tablet 20 mg  20 mg Oral QPM Milagros Loll, MD      . norethindrone (AYGESTIN) tablet 5 mg  5 mg Oral BID Milagros Loll, MD      . sodium chloride flush (NS) 0.9 % injection 3 mL  3 mL Intravenous Q12H Milagros Loll, MD      . sodium chloride flush (NS) 0.9 % injection 3 mL  3 mL Intravenous PRN Milagros Loll, MD      . vancomycin (VANCOCIN) 1,500 mg in sodium chloride 0.9 % 500 mL IVPB  1,500 mg Intravenous Once Valeda Malm Rumbarger, RPH 250 mL/hr at 11/12/15 2106 1,500 mg at 11/12/15 2106  . [START ON 11/14/2015] vancomycin (VANCOCIN) IVPB 750 mg/150 ml premix  750 mg Intravenous Q M,W,F-HD Valeda Malm Rumbarger, RPH        Allergies: Allergies as of 11/12/2015 - Review Complete 11/12/2015  Allergen Reaction Noted  .  Penicillin g Itching and Rash 11/12/2015  . Penicillins Itching and Rash 02/02/2013   Past Medical History  Diagnosis Date  . Deceased-donor kidney transplant     Performed at [facility], April 2010.  Initial ESRD due to HTN nephropathy  . Anemia of chronic disease   . History of hyperparathyroidism   . Hypertension   . Shortness of breath   . GERD (gastroesophageal reflux disease)   . Headache(784.0)   . Arthritis   . Wears glasses   . Eczema   . ESRD (end stage renal disease) ([place])     s/p transplant creatinine baseline 1.1  M/W/F dialysis  . FUO (fever of unknown origin) 05/17/2015   Past Surgical History  Procedure Laterality Date  . Kidney transplant  11/2008    [facility])  . Av fistula placement    . Fracture surgery      left foot,baby toe nad next toe missing  . Wisdom tooth extraction    . Bascilic vein transposition Left 10/30/2014    Procedure: LEFT BASCILIC VEIN TRANSPOSITION;  Surgeon: Rosetta Posner, MD;  Location: [place];  Service: Vascular;  Laterality: Left;  . Insertion of dialysis catheter Right 10/30/2014    Procedure: INSERTION OF DIALYSIS CATHETER;  Surgeon: Rosetta Posner, MD;  Location[place];  Service: Vascular;  Laterality: Right;  . Bascilic vein transposition Left 01/03/2015    Procedure: LEFT ARM 2ND STAGE [place];  Surgeon: Rosetta Posner, MD;  Location: [facility] OR;  Service: Vascular;  Laterality: Left;   Family History  Problem Relation Age of Onset  . Diabetes Brother   . Deep vein thrombosis Brother   . Hypertension Mother   . Hypertension Sister   . Hyperlipidemia Sister   . Hypertension Father   . Kidney disease Brother     on HD   Social History   Social History  . Marital Status: Single    Spouse Name: N/A  . Number of Children: N/A  . Years of Education: N/A   Occupational History  . Not on file.   Social History Main Topics  . Smoking status: Current Every Day Smoker -- 0.25 packs/day for 27 years     Types: Cigarettes  . Smokeless tobacco: Never Used     Comment: 10 cigarettes a day  . Alcohol Use: No     Comment: occasional drinker noted in the past  . Drug  Use: No  . Sexual Activity:    Partners: Male    Birth Control/ Protection: None   Other Topics Concern  . Not on file   Social History Narrative   Lives with daughter (born 109), sister helps her with medications etc.    Review of Systems: Pertinent items noted in HPI and remainder of comprehensive ROS otherwise negative.  Physical Exam: Blood pressure 152/81, pulse 91, temperature 102.7 F (39.3 C), temperature source Oral, resp. rate 19, height 5\' 7"  (1.702 m), weight 171 lb (77.565 kg), last menstrual period 06/02/2015, SpO2 98 %.   Gen: Uncomfortable-appearing, alert and oriented to person, place, and time HEENT: Oropharynx clear without erythema or exudate.  Neck: No cervical LAD, no thyromegaly or nodules, no JVD noted. CV: Normal rate, regular rhythm, no murmurs, rubs, or gallops Pulmonary: Normal effort, moderate expiratory wheezes heard bilaterally, faint coarse crackles heard bilaterally. Abdominal: Soft, mild diffuse tenderness to palpation, non-distended, without rebound, guarding, or masses Extremities: Distal pulses 2+ in upper and lower extremities bilaterally, no tenderness, erythema or edema Neuro: CN II-XII grossly intact, no focal weakness or sensory deficits noted Skin: No atypical appearing moles. No rashes  Lab results: Basic Metabolic Panel:  Recent Labs  11/12/15 1648  NA 137  K 3.8  CL 92*  CO2 31  GLUCOSE 117*  BUN 13  CREATININE 5.35*  CALCIUM 8.6*   Liver Function Tests:  Recent Labs  11/12/15 1648  AST 36  ALT 52  ALKPHOS 138*  BILITOT 0.8  PROT 7.6  ALBUMIN 3.1*   CBC:  Recent Labs  11/12/15 1648  WBC 11.4*  NEUTROABS 10.0*  HGB 9.8*  HCT 29.7*  MCV 94.3  PLT 243   Imaging results:  Dg Chest 2 View  11/12/2015  CLINICAL DATA:  Cough and fever. Flu-like  symptoms for a week. Chills. EXAM: CHEST  2 VIEW COMPARISON:  10/31/2015 FINDINGS: Patchy bilateral airspace disease consistent with pneumonia given the history. Borderline cardiomegaly. Negative aortic and hilar contours. There may be trace left pleural effusion. No cavitary findings. IMPRESSION: Multi lobar bilateral pneumonia. Electronically Signed   By: Monte Fantasia M.D.   On: 11/12/2015 17:04   Assessment & Plan by Problem: 1. Mulitlobar CAP in a dialysis patient - pt initially with flu-like sx over ~2 weeks with initial improvement, now with acute worsening over the past few days. Pt febrile here, otherwise VSS, mild leukocytosis, wheezing and crackles, with CXR showing bilateral multilocal PNA. Given interval improvement with acute worsening, as well as HD status, the patient is at least at an intermediate risk of MDR bugs including MRSA, so it is reasonable to include MRSA coverage initially. Given the clinical course, this is most likely a bacterial PNA although influenza alone could reflect these symptoms. -Continue vancomycin and cefepime IV for now; deescalate and transition to PO as indicated -Duonebs q6 PRN for wheezing (note no h/o COPD, asthma) -MRSA PCR here; consider vanc d/c if symptoms improve and MRSA negative -F/u sputum GS + Cx -Influenza panel, urine Ag's -Voltaren gel PRN for post-tussive pain -Tylenol PRN for fever -EKG, telemetry -Counseled patient on smoking cessation; patient is interested in quitting aids on discharge. Continue to counsel. -Follow-up UA, urine studies for h/o dysuria  2. ESRD 2/2 HTN on MWF HD -Continue home ASA, amlodipine, atenolol, lisinopril -Continue home Phoslo -Consult nephrology if still here Wednesday  DVT ppx - Heparin  Dispo: Disposition is deferred at this time, awaiting improvement of current medical problems. Anticipated discharge  in approximately 1-3 day(s).   The patient does not have a current PCP Mauricia Area, MD) and  does need an [facility] hospital follow-up appointment after discharge.  The patient does not have transportation limitations that hinder transportation to clinic appointments.  Signed: Norval Gable, MD 11/12/2015, 10:38 PM

## 2015-11-12 NOTE — Progress Notes (Signed)
Pharmacy Code Sepsis Protocol  Time of code sepsis page: 2029 [x]  Antibiotics delivered at 2045 []  Antibiotics administered prior to code at  (if checked, omit next 2 questions)  Were antibiotics ordered at the time of the code sepsis page? Yes Was it required to contact the physician? [x]  Physician not contacted []  Physician contacted to order antibiotics for code sepsis []  Physician contacted to recommend changing antibiotics  Pharmacy consulted for: vancomycin & cefepime  Anti-infectives    Start     Dose/Rate Route Frequency Ordered Stop   11/14/15 1800  ceFEPIme (MAXIPIME) 2 g in dextrose 5 % 50 mL IVPB     2 g 100 mL/hr over 30 Minutes Intravenous Every M-W-F (1800) 11/12/15 2046     11/14/15 1200  vancomycin (VANCOCIN) IVPB 750 mg/150 ml premix     750 mg 150 mL/hr over 60 Minutes Intravenous Every M-W-F (Hemodialysis) 11/12/15 2046     11/12/15 2045  vancomycin (VANCOCIN) 1,500 mg in sodium chloride 0.9 % 500 mL IVPB     1,500 mg 250 mL/hr over 120 Minutes Intravenous  Once 11/12/15 2034     11/12/15 2030  ceFEPIme (MAXIPIME) 2 g in dextrose 5 % 50 mL IVPB     2 g 100 mL/hr over 30 Minutes Intravenous  Once 11/12/15 2025     11/12/15 2030  vancomycin (VANCOCIN) IVPB 1000 mg/200 mL premix  Status:  Discontinued     1,000 mg 200 mL/hr over 60 Minutes Intravenous  Once 11/12/15 2025 11/12/15 2034        Nurse education provided: [x]  Minutes left to administer antibiotics to achieve 1 hour goal [x]  Correct order of antibiotic administration [x]  Antibiotic Y-site compatibilities     Lynel Forester, Rande Lawman, PharmD 11/12/2015, 8:46 PM

## 2015-11-12 NOTE — ED Notes (Signed)
Admitting at bedside 

## 2015-11-12 NOTE — ED Notes (Signed)
Called carelink to activate code sepsis  

## 2015-11-12 NOTE — ED Notes (Signed)
Pt states she has been having flu-like symptoms for over a week. Pt received antibiotics and has continued to have symptoms. Pt has productive cough- clear phlegm. Pt states she has been running fevers and has chills.

## 2015-11-12 NOTE — Progress Notes (Signed)
Pharmacy Antibiotic Note  Ruth Gutierrez is a 50 y.o. female admitted on 11/12/2015 with sepsis.  Presents with flu-like symptoms for 1 week including fevers, chills, and cough.  PMH includes Kidney transplant (5 years ago), CKD receiving HD on MWF.  Patient confirms that she did not receive Vanc/cefepime at HD today.  Pharmacy has been consulted for Vancomycin and Cefepime dosing.  On Admit: Febrile (103.2), HR 105, RR 20, WBC 11.4 Hypertensive (161/102), LA 1.02,   Plan: --Vancomycin 1500 mg IV x 1, then 750 mg IV qHD --Cefepime 2 g IV qHD --Obtain Vanc Level PRE-HD (goal 15-25) --Follow clinical course and cultures --Follow up with nephrology regard HD plan  Height: 5\' 7"  (170.2 cm) Weight: 171 lb (77.565 kg) IBW/kg (Calculated) : 61.6  Temp (24hrs), Avg:102.9 F (39.4 C), Min:102.7 F (39.3 C), Max:103.2 F (39.6 C)   Recent Labs Lab 11/12/15 1648 11/12/15 1659  WBC 11.4*  --   CREATININE 5.35*  --   LATICACIDVEN  --  1.02    Estimated Creatinine Clearance: 13.8 mL/min (by C-G formula based on Cr of 5.35).    Allergies  Allergen Reactions  . Penicillins Other (See Comments)    Itchiness    Antimicrobials this admission: 4/10 Cefepime >>  4/10 Vanc >>   Dose adjustments this admission:   Microbiology results: 4/10 BCx: Vanc   Thank you for allowing pharmacy to be a part of this patient's care.  [name] 11/12/2015 8:38 PM

## 2015-11-13 ENCOUNTER — Encounter (HOSPITAL_COMMUNITY): Payer: Self-pay | Admitting: *Deleted

## 2015-11-13 DIAGNOSIS — N186 End stage renal disease: Secondary | ICD-10-CM

## 2015-11-13 DIAGNOSIS — Z992 Dependence on renal dialysis: Secondary | ICD-10-CM

## 2015-11-13 DIAGNOSIS — D72829 Elevated white blood cell count, unspecified: Secondary | ICD-10-CM

## 2015-11-13 DIAGNOSIS — J189 Pneumonia, unspecified organism: Secondary | ICD-10-CM | POA: Insufficient documentation

## 2015-11-13 DIAGNOSIS — R509 Fever, unspecified: Secondary | ICD-10-CM

## 2015-11-13 LAB — INFLUENZA PANEL BY PCR (TYPE A & B)
H1N1FLUPCR: NOT DETECTED
INFLBPCR: NEGATIVE
Influenza A By PCR: NEGATIVE

## 2015-11-13 LAB — MRSA PCR SCREENING: MRSA by PCR: NEGATIVE

## 2015-11-13 MED ORDER — CALCITRIOL 0.5 MCG PO CAPS
0.5000 ug | ORAL_CAPSULE | ORAL | Status: DC
  Administered 2015-11-14 – 2015-11-16 (×2): 0.5 ug via ORAL
  Filled 2015-11-13: qty 1

## 2015-11-13 MED ORDER — DEXTROMETHORPHAN POLISTIREX ER 30 MG/5ML PO SUER
30.0000 mg | Freq: Two times a day (BID) | ORAL | Status: DC | PRN
  Administered 2015-11-13 – 2015-11-14 (×2): 30 mg via ORAL
  Filled 2015-11-13 (×4): qty 5

## 2015-11-13 MED ORDER — HYDROCODONE-HOMATROPINE 5-1.5 MG/5ML PO SYRP
5.0000 mL | ORAL_SOLUTION | Freq: Four times a day (QID) | ORAL | Status: DC | PRN
  Administered 2015-11-13 – 2015-11-16 (×8): 5 mL via ORAL
  Filled 2015-11-13 (×8): qty 5

## 2015-11-13 MED ORDER — HYDRALAZINE HCL 20 MG/ML IJ SOLN: 2.0000 mg | Freq: Four times a day (QID) | INTRAMUSCULAR | Status: DC | PRN

## 2015-11-13 MED ORDER — RENA-VITE PO TABS
1.0000 | ORAL_TABLET | Freq: Every day | ORAL | Status: DC
  Administered 2015-11-13 – 2015-11-15 (×3): 1 via ORAL
  Filled 2015-11-13 (×3): qty 1

## 2015-11-13 MED ORDER — CALCIUM ACETATE (PHOS BINDER) 667 MG PO CAPS
3335.0000 mg | ORAL_CAPSULE | Freq: Three times a day (TID) | ORAL | Status: DC
  Administered 2015-11-13 – 2015-11-16 (×7): 3335 mg via ORAL
  Filled 2015-11-13 (×7): qty 5

## 2015-11-13 MED ORDER — ONDANSETRON HCL 4 MG/2ML IJ SOLN
4.0000 mg | Freq: Four times a day (QID) | INTRAMUSCULAR | Status: DC | PRN
  Administered 2015-11-13 – 2015-11-14 (×2): 4 mg via INTRAVENOUS
  Filled 2015-11-13 (×2): qty 2

## 2015-11-13 MED ORDER — DARBEPOETIN ALFA 100 MCG/0.5ML IJ SOSY
100.0000 ug | PREFILLED_SYRINGE | INTRAMUSCULAR | Status: DC
  Administered 2015-11-14: 100 ug via INTRAVENOUS
  Filled 2015-11-13: qty 0.5

## 2015-11-13 MED ORDER — DIPHENHYDRAMINE HCL 25 MG PO CAPS
25.0000 mg | ORAL_CAPSULE | Freq: Every day | ORAL | Status: DC
  Administered 2015-11-13 – 2015-11-16 (×4): 25 mg via ORAL
  Filled 2015-11-13 (×4): qty 1

## 2015-11-13 MED ORDER — AMLODIPINE BESYLATE 10 MG PO TABS
10.0000 mg | ORAL_TABLET | Freq: Every day | ORAL | Status: DC
  Administered 2015-11-14 – 2015-11-15 (×2): 10 mg via ORAL
  Filled 2015-11-13 (×2): qty 1

## 2015-11-13 MED ORDER — CALCIUM ACETATE (PHOS BINDER) 667 MG PO CAPS: 1334.0000 mg | ORAL_CAPSULE | Freq: Two times a day (BID) | ORAL | Status: DC | PRN

## 2015-11-13 MED ORDER — DEXTROMETHORPHAN POLISTIREX ER 30 MG/5ML PO SUER
30.0000 mg | Freq: Every evening | ORAL | Status: DC | PRN
  Administered 2015-11-13: 30 mg via ORAL
  Filled 2015-11-13 (×3): qty 5

## 2015-11-13 NOTE — Progress Notes (Signed)
Filed Vitals:   11/13/15 0500 11/13/15 1034  BP: 128/97 186/106  Pulse: 92 98  Temp: 102.1 F (38.9 C) 100.6 F (38.1 C)  Resp: 19 22    Elevated BP noted. Blood pressure medication was just given prior to check. Will recheck BP at next round.

## 2015-11-13 NOTE — Progress Notes (Signed)
Subjective: [name].  She has remained febrile with unchanged SOB and dry cough.  Overall, her SOB has improved compared to yesterday.  Objective: Vital signs in last 24 hours: Filed Vitals:   11/12/15 2130 11/13/15 0002 11/13/15 0500 11/13/15 1034  BP: 152/81  128/97 186/106  Pulse: 91  92 98  Temp: 101.3 F (38.5 C) 100.9 F (38.3 C) 102.1 F (38.9 C) 100.6 F (38.1 C)  TempSrc: Oral  Oral Oral  Resp: 19  19 22   Height:      Weight:      SpO2: 98%  95% 96%   Weight change:   Intake/Output Summary (Last 24 hours) at 11/13/15 1047 Last data filed at 11/13/15 0400  Gross per 24 hour  Intake    740 ml  Output      0 ml  Net    740 ml   Physical Exam  Constitutional: She is oriented to person, place, and time and well-developed, well-nourished, and in no distress. No distress.  HENT:  Head: Normocephalic and atraumatic.  Eyes: EOM are normal. No scleral icterus.  Neck: No tracheal deviation present.  Cardiovascular: Normal rate, regular rhythm, normal heart sounds and intact distal pulses.   Pulmonary/Chest: Effort normal and breath sounds normal. No stridor. No respiratory distress. She has no wheezes.  No crackles appreciated.  Abdominal: Soft. She exhibits no distension. There is no tenderness. There is no rebound and no guarding.  Musculoskeletal:  Trace edema to midshin.  Neurological: She is alert and oriented to person, place, and time.  Skin: Skin is warm and dry. She is not diaphoretic.    Lab Results: Basic Metabolic Panel:  Recent Labs Lab 11/12/15 1648  NA 137  K 3.8  CL 92*  CO2 31  GLUCOSE 117*  BUN 13  CREATININE 5.35*  CALCIUM 8.6*   Liver Function Tests:  Recent Labs Lab 11/12/15 1648  AST 36  ALT 52  ALKPHOS 138*  BILITOT 0.8  PROT 7.6  ALBUMIN 3.1*   No results for input(s): LIPASE, AMYLASE in the last 168 hours. No results for input(s): AMMONIA in the last 168 hours. CBC:  Recent Labs Lab 11/12/15 1648  WBC 11.4*    NEUTROABS 10.0*  HGB 9.8*  HCT 29.7*  MCV 94.3  PLT 243   Cardiac Enzymes: No results for input(s): CKTOTAL, CKMB, CKMBINDEX, TROPONINI in the last 168 hours. BNP: No results for input(s): PROBNP in the last 168 hours. D-Dimer: No results for input(s): DDIMER in the last 168 hours. CBG: No results for input(s): GLUCAP in the last 168 hours. Hemoglobin A1C: No results for input(s): HGBA1C in the last 168 hours. Fasting Lipid Panel: No results for input(s): CHOL, HDL, LDLCALC, TRIG, CHOLHDL, LDLDIRECT in the last 168 hours. Thyroid Function Tests: No results for input(s): TSH, T4TOTAL, FREET4, T3FREE, THYROIDAB in the last 168 hours. Coagulation: No results for input(s): LABPROT, INR in the last 168 hours. Anemia Panel: No results for input(s): VITAMINB12, FOLATE, FERRITIN, TIBC, IRON, RETICCTPCT in the last 168 hours. Urine Drug Screen: Drugs of Abuse     Component Value Date/Time   LABOPIA NONE DETECTED 02/02/2013 1240   COCAINSCRNUR NONE DETECTED 02/02/2013 1240   LABBENZ NONE DETECTED 02/02/2013 1240   AMPHETMU NONE DETECTED 02/02/2013 1240   THCU NONE DETECTED 02/02/2013 1240   LABBARB NONE DETECTED 02/02/2013 1240    Alcohol Level: No results for input(s): ETH in the last 168 hours. Urinalysis: No results for input(s): COLORURINE, LABSPEC, [place], [place],  HGBUR, BILIRUBINUR, KETONESUR, PROTEINUR, UROBILINOGEN, NITRITE, LEUKOCYTESUR in the last 168 hours.  Invalid input(s): APPERANCEUR Misc. Labs:   Micro Results: Recent Results (from the past 240 hour(s))  MRSA PCR Screening     Status: None   Collection Time: 11/12/15 11:49 PM  Result Value Ref Range Status   MRSA by PCR NEGATIVE NEGATIVE Final    Comment:        The GeneXpert MRSA Assay (FDA approved for NASAL specimens only), is one component of a comprehensive MRSA colonization surveillance program. It is not intended to diagnose MRSA infection nor to guide or monitor treatment for MRSA  infections.    Studies/Results: Dg Chest 2 View  11/12/2015  CLINICAL DATA:  Cough and fever. Flu-like symptoms for a week. Chills. EXAM: CHEST  2 VIEW COMPARISON:  10/31/2015 FINDINGS: Patchy bilateral airspace disease consistent with pneumonia given the history. Borderline cardiomegaly. Negative aortic and hilar contours. There may be trace left pleural effusion. No cavitary findings. IMPRESSION: Multi lobar bilateral pneumonia. Electronically Signed   By: Monte Fantasia M.D.   On: 11/12/2015 17:04   Medications: I have reviewed the patient's current medications. Scheduled Meds: . amLODipine  10 mg Oral Daily  . aspirin EC  81 mg Oral Daily  . atenolol  50 mg Oral BID  . calcium acetate  3,335 mg Oral TID WC   And  . calcium acetate  1,334 mg Oral With snacks  . [START ON 11/14/2015] ceFEPime (MAXIPIME) IV  2 g Intravenous Q M,W,F-1800  . diclofenac sodium  4 g Topical QID  . diphenhydrAMINE  25 mg Oral Daily  . heparin  5,000 Units Subcutaneous 3 times per day  . lisinopril  20 mg Oral QPM  . norethindrone  5 mg Oral BID  . sodium chloride flush  3 mL Intravenous Q12H   Continuous Infusions:  PRN Meds:.sodium chloride, acetaminophen, dextromethorphan, ipratropium-albuterol, sodium chloride flush Assessment/Plan: Active Problems:   Sepsis ([place])   Pneumonia  Ms. Babauta is a 50yo current smoker with ESRD 2/2 HTN s/p DDKT in 2010 now failed and back on MWF HD since 3/16 who presents with cough, shortness of breath, and subjective fevers for the past 2 weeks.  Multilobar CAP in a dialysis patient: Patient initially with flu-like symptoms over ~2 weeks with initial improvement, now with acute worsening over the past few days. Previously receiving Vancomycin 1500 mg load on 4/10 and Ceftazidime 2g on 4/10 at dialysis.  Blood cultures were drawn at dialysis on 4/10 are so far negative. She remains febrile with mild leukocytosis, wheezing and crackles, with CXR showing bilateral  multilocal PNA. MRSA screen negative, so unlikely to be MRSA. Given the clinical course, this is most likely a bacterial/atypical PNA.   - Cefepime IV  - STOP Vancomcyin - Duonebs q6 PRN for wheezing (note no h/o COPD, asthma) [ ]  Sputum Cx [ ]  Flu/Strep Ag - Voltaren gel PRN for post-tussive pain - Tylenol PRN for fever - Teley [ ]  U/A  ESRD (MWF) 2/2 HTN: Patient completed full session on Monday.  However, she also received 1.5 L in ED.  Breathing comfortably so does not appear volume overloaded.  Will consult nephrology to resume schedule tomorrow. - Continue home ASA, Amlodipine, Atenolol, Lisinopril - Continue home Phoslo - Nephrology following  DVT ppx - Heparin  Dispo: Disposition is deferred at this time, awaiting improvement of current medical problems.  Anticipated discharge in approximately 2-3 day(s).   The patient does have a current PCP Jeneen Rinks  Deter[place], MD) and does need an [facility] hospital follow-up appointment after discharge.  The patient does not have transportation limitations that hinder transportation to clinic appointments.  .Services Needed at time of discharge: Y = Yes, Blank = No PT:   OT:   RN:   Equipment:   Other:     LOS: 1 day   Iline Oven, MD 11/13/2015, 10:47 AM

## 2015-11-13 NOTE — Consult Note (Signed)
[facility] Renal Consultation Note    Indication for Consultation:  Management of ESRD/hemodialysis; anemia, hypertension/volume and secondary hyperparathyroidism PCP:  HPI: Ruth Gutierrez is a 50 y.o. female with ESRD secondary to HTN and prior renal transplant on MWF HD at [place]. Pt presented to dialysis yesterday with a temp of 102.9 and chills since Saturday and vomiting since Sunday.  BC x 2 were drawn, Vanc 1.5 gm and Fortaz 2 gm were given with pt instructed to go to the ED post HD for CXR and flu swab. Post HD temp was 102.8 She had a net UF of 3.6 BP was high 190s/100s pre and post. CXR yesterday showed bilateral PNA, negative influencza.  She had previously had fevers 3/29 at her dialysis unit (also 3/24 and 3/27 that were not reported).   One set of BC were drawn 3/29  which was negative.  She was treated with Vanco 750 x 3 and Fortaz 2 gm x 3.  She also was seen in the ED 3/29 post HD for abdominal pain, was febrile at that time, had mildly elevated lipase, neg HIV  WBC was 4.7 3/29. Yesterday WBC was 11.4 with 88% N.  BC were redrawn in the ED and pt was given ANOTHER 1.5 gm of Vancomycin and Maxepime.   She continues to have fatigue, fever and cough, though the cough is now dry, not mucousy like before she had antibotics.  She has not had any N, V or D and has eaten little due to the persistent fever. She has not wounds and has had a prior transplant nephrectomy.  She is a smoker. She is due for dialysis tomorrow  Past Medical History  Diagnosis Date  . Deceased-donor kidney transplant     Performed at [facility], April 2010.  Initial ESRD due to HTN nephropathy  . Anemia of chronic disease   . History of hyperparathyroidism   . Hypertension   . Shortness of breath   . GERD (gastroesophageal reflux disease)   . Headache(784.0)   . Arthritis   . Wears glasses   . Eczema   . ESRD (end stage renal disease) ([place])     s/p transplant creatinine baseline 1.1  M/W/F  dialysis  . FUO (fever of unknown origin) 05/17/2015   Past Surgical History  Procedure Laterality Date  . Kidney transplant  11/2008    [facility])  . Av fistula placement    . Fracture surgery      left foot,baby toe nad next toe missing  . Wisdom tooth extraction    . Bascilic vein transposition Left 10/30/2014    Procedure: LEFT BASCILIC VEIN TRANSPOSITION;  Surgeon: Rosetta Posner, MD;  Location: [place];  Service: Vascular;  Laterality: Left;  . Insertion of dialysis catheter Right 10/30/2014    Procedure: INSERTION OF DIALYSIS CATHETER;  Surgeon: Rosetta Posner, MD;  Location: [place];  Service: Vascular;  Laterality: Right;  . Bascilic vein transposition Left 01/03/2015    Procedure: LEFT ARM 2ND STAGE [place];  Surgeon: Rosetta Posner, MD;  Location: [facility] OR;  Service: Vascular;  Laterality: Left;   Family History  Problem Relation Age of Onset  . Diabetes Brother   . Deep vein thrombosis Brother   . Hypertension Mother   . Hypertension Sister   . Hyperlipidemia Sister   . Hypertension Father   . Kidney disease Brother     on HD   Social History:  reports that she has been  smoking Cigarettes.  She has a 13.5 pack-year smoking history. She has never used smokeless tobacco. She reports that she does not drink alcohol or use illicit drugs. Allergies  Allergen Reactions  . Penicillin G Itching and Rash  . Penicillins Itching and Rash   Prior to Admission medications   Medication Sig Start Date End Date Taking? Authorizing Provider  amLODipine (NORVASC) 10 MG tablet Take 10 mg by mouth daily.  03/01/11  Yes Historical Provider, MD  aspirin 81 MG tablet Take 81 mg by mouth daily.    Yes Historical Provider, MD  atenolol (TENORMIN) 50 MG tablet Take 50 mg by mouth 2 (two) times daily.  03/17/11  Yes Historical Provider, MD  calcium acetate (PHOSLO) 667 MG capsule Take 1,334-3,335 mg by mouth 3 (three) times daily with meals. Tales 5 caps with each meal, 2 caps with  snacks   Yes Historical Provider, MD  diphenhydrAMINE (BENADRYL) 25 MG tablet Take 25 mg by mouth every 8 (eight) hours as needed (for cough).   Yes Historical Provider, MD  lisinopril (PRINIVIL,ZESTRIL) 20 MG tablet Take 20 mg by mouth every evening.   Yes Historical Provider, MD  norethindrone (AYGESTIN) 5 MG tablet Take 5 mg by mouth 2 (two) times daily.  10/19/14  Yes Historical Provider, MD   Current Facility-Administered Medications  Medication Dose Route Frequency Provider Last Rate Last Dose  . 0.9 %  sodium chloride infusion  250 mL Intravenous PRN Milagros Loll, MD      . acetaminophen (TYLENOL) tablet 650 mg  650 mg Oral Q6H PRN Milagros Loll, MD   650 mg at 11/13/15 1217  . amLODipine (NORVASC) tablet 10 mg  10 mg Oral Daily Milagros Loll, MD   10 mg at 11/13/15 1020  . aspirin EC tablet 81 mg  81 mg Oral Daily Milagros Loll, MD   81 mg at 11/13/15 1021  . atenolol (TENORMIN) tablet 50 mg  50 mg Oral BID Milagros Loll, MD   50 mg at 11/13/15 1021  . calcium acetate (PHOSLO) capsule 1,334 mg  1,334 mg Oral BID BM PRN Bartholomew Crews, MD      . calcium acetate (PHOSLO) capsule 3,335 mg  3,335 mg Oral TID WC Bartholomew Crews, MD      . Derrill Memo ON 11/14/2015] ceFEPIme (MAXIPIME) 2 g in dextrose 5 % 50 mL IVPB  2 g Intravenous Q M,W,F-1800 Rachel L Rumbarger, RPH      . dextromethorphan (DELSYM) 30 MG/5ML liquid 30 mg  30 mg Oral BID PRN Iline Oven, MD   30 mg at 11/13/15 1217  . diclofenac sodium (VOLTAREN) 1 % transdermal gel 4 g  4 g Topical QID Milagros Loll, MD   4 g at 11/13/15 1327  . diphenhydrAMINE (BENADRYL) capsule 25 mg  25 mg Oral Daily Juliet Rude, MD   25 mg at 11/13/15 1021  . heparin injection 5,000 Units  5,000 Units Subcutaneous 3 times per day Milagros Loll, MD   5,000 Units at 11/12/15 2245  . ipratropium-albuterol (DUONEB) 0.5-2.5 (3) MG/3ML nebulizer solution 3 mL  3 mL Nebulization Q2H PRN Milagros Loll, MD      . lisinopril  (PRINIVIL,ZESTRIL) tablet 20 mg  20 mg Oral QPM Milagros Loll, MD   20 mg at 11/12/15 2302  . norethindrone (AYGESTIN) tablet 5 mg  5 mg Oral BID Milagros Loll, MD   5 mg at 11/13/15 1020  .  sodium chloride flush (NS) 0.9 % injection 3 mL  3 mL Intravenous Q12H Milagros Loll, MD   3 mL at 11/13/15 1022  . sodium chloride flush (NS) 0.9 % injection 3 mL  3 mL Intravenous PRN Milagros Loll, MD       Labs: Basic Metabolic Panel:  Recent Labs Lab 11/12/15 1648  NA 137  K 3.8  CL 92*  CO2 31  GLUCOSE 117*  BUN 13  CREATININE 5.35*  CALCIUM 8.6*   Liver Function Tests:  Recent Labs Lab 11/12/15 1648  AST 36  ALT 52  ALKPHOS 138*  BILITOT 0.8  PROT 7.6  ALBUMIN 3.1*   CBC:  Recent Labs Lab 11/12/15 1648  WBC 11.4*  NEUTROABS 10.0*  HGB 9.8*  HCT 29.7*  MCV 94.3  PLT 243  Studies/Results: Dg Chest 2 View  11/12/2015  CLINICAL DATA:  Cough and fever. Flu-like symptoms for a week. Chills. EXAM: CHEST  2 VIEW COMPARISON:  10/31/2015 FINDINGS: Patchy bilateral airspace disease consistent with pneumonia given the history. Borderline cardiomegaly. Negative aortic and hilar contours. There may be trace left pleural effusion. No cavitary findings. IMPRESSION: Multi lobar bilateral pneumonia. Electronically Signed   By: Monte Fantasia M.D.   On: 11/12/2015 17:04    ROS: As per HPI otherwise negative.  Physical Exam: Filed Vitals:   11/13/15 0500 11/13/15 1034 11/13/15 1209 11/13/15 1328  BP: 128/97 186/106    Pulse: 92 98    Temp: 102.1 F (38.9 C) 100.6 F (38.1 C) 102.9 F (39.4 C) 100.5 F (38.1 C)  TempSrc: Oral Oral Oral Oral  Resp: 19 22    Height:      Weight:      SpO2: 95% 96%       General: chronically Ill appearing AAF  in no acute distress sats ok on room air coughing Head: Normocephalic, atraumatic, sclera non-icteric, mucus membranes are moist Fundi with hypertensive changes, art narrowing , AV nicking,silver wiring Neck: Supple. JVD not  elevated. Signif Ant and post Cervical Nodes Lungs:  Breathing is unlabored at rest with bilateral wheezes bilat rhonchi Heart: RRR with S1 S2.Gr 2/6 SEM Abdomen: Soft, non-tender, non-distended with normoactive bowel sounds. No rebound/guarding. Liver down 4 cm  M-S:  Strength and tone appear normal for age. Lower extremities:without edema or ischemic changes, no open wounds  Neuro: Alert and oriented X 3. Moves all extremities spontaneously. Psych:  Responds to questions appropriately with a normal affect. Dialysis Access: AVF left upper - lower site started bleeding when I resumed dressing - redressed - watch on HD  Dialysis Orders: [facility] MWF 180 450/800 EDW 77.5 2 K 2 Ca no profile L upper AVF heaprin 6000 Mircera 75 q 2eeks- due this week- last 3/15 calcitriol 0.5 and no Fe (s/p 3 doses) Recent labs: Hgb 10.2 21% sat Ca ok iPTH 5- 6s iPTH 679  Assessment/Plan: 1. Bilateral PNA - BC x2 from [place] and [facility] pending - has received 3 gm of Vanc - will check Vanc level pre HD Wednesday; Vanc stopped by primary but will check Vanc level incase they wish to continue as fever has not defervesced though level should be plenty high;  on Maxipime 2. ESRD -  MWF - HD in the am K 3.8 post HD yesterday Need to lower vol to help with BP and med requirement 3. Hypertension/volume  - needs better BP control; try better control by lowering volume or will have to adjust BP meds -  pre BPs run 180 - 190 and post 160 - 190; on norvasc, atenolol and lisinopril; BP variable - had additional IVF in ED mild to mod issue today sats ok XS iatrogenic fluid administration that put patient at risk. 4. Anemia  - hgb 9.8; redose ESA this week 5. Metabolic bone disease -  Continue calcitriol/binders 6. Nutrition - alb 3.1 renal diet/vit 7. Tobacco abuse - discussed this is the perfect time to stop smoking  Myriam Jacobson, PA-C [place] [phone] 11/13/2015, 2:48 PM I have seen and examined this  patient and agree with the plan of care seen, examined,eval,counseled. .  Jamauri Kruzel L 11/13/2015, 3:54 PM

## 2015-11-14 DIAGNOSIS — N186 End stage renal disease: Secondary | ICD-10-CM | POA: Insufficient documentation

## 2015-11-14 DIAGNOSIS — Z992 Dependence on renal dialysis: Secondary | ICD-10-CM

## 2015-11-14 DIAGNOSIS — R112 Nausea with vomiting, unspecified: Secondary | ICD-10-CM

## 2015-11-14 DIAGNOSIS — R197 Diarrhea, unspecified: Secondary | ICD-10-CM

## 2015-11-14 LAB — CBC WITH DIFFERENTIAL/PLATELET
Basophils Absolute: 0 K/uL (ref 0.0–0.1)
Basophils Relative: 0 %
Eosinophils Absolute: 0 K/uL (ref 0.0–0.7)
Eosinophils Relative: 0 %
HCT: 26 % — ABNORMAL LOW (ref 36.0–46.0)
Hemoglobin: 9.1 g/dL — ABNORMAL LOW (ref 12.0–15.0)
Lymphocytes Relative: 6 %
Lymphs Abs: 1 K/uL (ref 0.7–4.0)
MCH: 33.2 pg (ref 26.0–34.0)
MCHC: 35 g/dL (ref 30.0–36.0)
MCV: 94.9 fL (ref 78.0–100.0)
Monocytes Absolute: 0.7 K/uL (ref 0.1–1.0)
Monocytes Relative: 4 %
Neutro Abs: 15.6 K/uL — ABNORMAL HIGH (ref 1.7–7.7)
Neutrophils Relative %: 90 %
Platelets: 165 K/uL (ref 150–400)
RBC: 2.74 MIL/uL — ABNORMAL LOW (ref 3.87–5.11)
RDW: 14.2 % (ref 11.5–15.5)
WBC: 17.2 K/uL — ABNORMAL HIGH (ref 4.0–10.5)

## 2015-11-14 LAB — RENAL FUNCTION PANEL
Albumin: 2.5 g/dL — ABNORMAL LOW (ref 3.5–5.0)
Anion gap: 17 — ABNORMAL HIGH (ref 5–15)
BUN: 40 mg/dL — ABNORMAL HIGH (ref 6–20)
CO2: 22 mmol/L (ref 22–32)
Calcium: 9.6 mg/dL (ref 8.9–10.3)
Chloride: 92 mmol/L — ABNORMAL LOW (ref 101–111)
Creatinine, Ser: 10.21 mg/dL — ABNORMAL HIGH (ref 0.44–1.00)
GFR calc Af Amer: 5 mL/min — ABNORMAL LOW
GFR calc non Af Amer: 4 mL/min — ABNORMAL LOW
Glucose, Bld: 128 mg/dL — ABNORMAL HIGH (ref 65–99)
Phosphorus: 6.9 mg/dL — ABNORMAL HIGH (ref 2.5–4.6)
Potassium: 5.2 mmol/L — ABNORMAL HIGH (ref 3.5–5.1)
Sodium: 131 mmol/L — ABNORMAL LOW (ref 135–145)

## 2015-11-14 LAB — VANCOMYCIN, RANDOM: Vancomycin Rm: 38 ug/mL

## 2015-11-14 MED ORDER — DARBEPOETIN ALFA 100 MCG/0.5ML IJ SOSY
PREFILLED_SYRINGE | INTRAMUSCULAR | Status: AC
Start: 2015-11-14 — End: 2015-11-14
  Filled 2015-11-14: qty 0.5

## 2015-11-14 MED ORDER — VANCOMYCIN HCL IN DEXTROSE 750-5 MG/150ML-% IV SOLN: 750.0000 mg | INTRAVENOUS | Status: DC

## 2015-11-14 MED ORDER — VANCOMYCIN HCL IN DEXTROSE 750-5 MG/150ML-% IV SOLN
750.0000 mg | INTRAVENOUS | Status: DC
  Administered 2015-11-16: 750 mg via INTRAVENOUS
  Filled 2015-11-14 (×2): qty 150

## 2015-11-14 NOTE — Procedures (Signed)
I was present at this session.  I have reviewed the session itself and made appropriate changes.n  HD via [facility] AVF . bp mildly ^,lowering vol ,lower meds, access press ok.  Samella Lucchetti L 4/12/20178:14 AM

## 2015-11-14 NOTE — Progress Notes (Signed)
Subjective: Interval History: has complaints , coughing but less.  Objective: Vital signs in last 24 hours: Temp:  [97 F (36.1 C)-103.2 F (39.6 C)] 97 F (36.1 C) (04/12 0720) Pulse Rate:  [89-113] 89 (04/12 0730) Resp:  [18-22] 22 (04/12 0720) BP: (135-213)/(70-106) 151/98 mmHg (04/12 0730) SpO2:  [89 %-100 %] 98 % (04/12 0720) Weight:  [77.4 kg (170 lb 10.2 oz)-79.334 kg (174 lb 14.4 oz)] 77.4 kg (170 lb 10.2 oz) (04/12 0720) Weight change: 1.769 kg (3 lb 14.4 oz)  Intake/Output from previous day: 04/11 0701 - 04/12 0700 In: 840 [P.O.:840] Out: -  Intake/Output this shift:    General appearance: alert, cooperative and no distress Resp: diminished breath sounds bilaterally and rales LLL Cardio: S1, S2 normal, S4 present and systolic murmur: holosystolic 2/6, blowing at apex GI: striae, soft,nontender, liver down 4 cm Extremities: edema 1+ and AVF  Lab Results:  Recent Labs  11/12/15 1648 11/14/15 0742  WBC 11.4* 17.2*  HGB 9.8* 9.1*  HCT 29.7* 26.0*  PLT 243 165   BMET:  Recent Labs  11/12/15 1648  NA 137  K 3.8  CL 92*  CO2 31  GLUCOSE 117*  BUN 13  CREATININE 5.35*  CALCIUM 8.6*   No results for input(s): PTH in the last 72 hours. Iron Studies: No results for input(s): IRON, TIBC, TRANSFERRIN, FERRITIN in the last 72 hours.  Studies/Results: Dg Chest 2 View  11/12/2015  CLINICAL DATA:  Cough and fever. Flu-like symptoms for a week. Chills. EXAM: CHEST  2 VIEW COMPARISON:  10/31/2015 FINDINGS: Patchy bilateral airspace disease consistent with pneumonia given the history. Borderline cardiomegaly. Negative aortic and hilar contours. There may be trace left pleural effusion. No cavitary findings. IMPRESSION: Multi lobar bilateral pneumonia. Electronically Signed   By: Monte Fantasia M.D.   On: 11/12/2015 17:04    I have reviewed the patient's current medications.  Assessment/Plan: 1 ESRD for HD.  Will lower vol to help bp, lower meds 2 HTN lower vol  lowe meds 3 Pneu on AB 4 Anemia esa,  5 HPTH vit D 6 Smoking counseled P HD, esa, AB,. Lower vol    LOS: 2 days   Kaithlyn Teagle L 11/14/2015,8:14 AM

## 2015-11-14 NOTE — Progress Notes (Signed)
Subjective: Ruth Gutierrez.  Her breathing is slightly improved though she still has a dry cough.  Her biggest concern is her continued fevers. She also reports one episode of nausea/vomiting yesterday and one day of watery diarrhea.  Objective: Vital signs in last 24 hours: Filed Vitals:   11/14/15 0724 11/14/15 0730 11/14/15 0800 11/14/15 0900  BP: 154/94 151/98 149/94 127/78  Pulse: 91 89 90 92  Temp:      TempSrc:      Resp:      Height:      Weight:      SpO2:       Weight change: 3 lb 14.4 oz (1.769 kg)  Intake/Output Summary (Last 24 hours) at 11/14/15 0915 Last data filed at 11/14/15 [id number]  Gross per 24 hour  Intake    600 ml  Output      0 ml  Net    600 ml   Physical Exam  Constitutional: She is oriented to person, place, and time and well-developed, well-nourished, and in no distress. No distress.  HENT:  Head: Normocephalic and atraumatic.  Eyes: EOM are normal. No scleral icterus.  Neck: No tracheal deviation present.  Cardiovascular: Normal rate, regular rhythm, normal heart sounds and intact distal pulses.   Pulmonary/Chest: Effort normal and breath sounds normal. No stridor. No respiratory distress. She has no wheezes.  Decreased breath sounds in bilateral bases.  No crackles appreciated.  Abdominal: Soft. She exhibits no distension. There is no tenderness. There is no rebound and no guarding.  Musculoskeletal:  Trace edema to midshin.  Neurological: She is alert and oriented to person, place, and time.  Skin: Skin is warm and dry. She is not diaphoretic.    Lab Results: Basic Metabolic Panel:  Recent Labs Lab 11/12/15 1648 11/14/15 0737  NA 137 131*  K 3.8 5.2*  CL 92* 92*  CO2 31 22  GLUCOSE 117* 128*  BUN 13 40*  CREATININE 5.35* 10.21*  CALCIUM 8.6* 9.6  PHOS  --  6.9*   Liver Function Tests:  Recent Labs Lab 11/12/15 1648 11/14/15 0737  AST 36  --   ALT 52  --   ALKPHOS 138*  --   BILITOT 0.8  --   PROT 7.6  --   ALBUMIN 3.1* 2.5*    No results for input(s): LIPASE, AMYLASE in the last 168 hours. No results for input(s): AMMONIA in the last 168 hours. CBC:  Recent Labs Lab 11/12/15 1648 11/14/15 0742  WBC 11.4* 17.2*  NEUTROABS 10.0* 15.6*  HGB 9.8* 9.1*  HCT 29.7* 26.0*  MCV 94.3 94.9  PLT 243 165   Cardiac Enzymes: No results for input(s): CKTOTAL, CKMB, CKMBINDEX, TROPONINI in the last 168 hours. BNP: No results for input(s): PROBNP in the last 168 hours. D-Dimer: No results for input(s): DDIMER in the last 168 hours. CBG: No results for input(s): GLUCAP in the last 168 hours. Hemoglobin A1C: No results for input(s): HGBA1C in the last 168 hours. Fasting Lipid Panel: No results for input(s): CHOL, HDL, LDLCALC, TRIG, CHOLHDL, LDLDIRECT in the last 168 hours. Thyroid Function Tests: No results for input(s): TSH, T4TOTAL, FREET4, T3FREE, THYROIDAB in the last 168 hours. Coagulation: No results for input(s): LABPROT, INR in the last 168 hours. Anemia Panel: No results for input(s): VITAMINB12, FOLATE, FERRITIN, TIBC, IRON, RETICCTPCT in the last 168 hours. Urine Drug Screen: Drugs of Abuse     Component Value Date/Time   LABOPIA NONE DETECTED 02/02/2013 [place]  NONE DETECTED 02/02/2013 1240   LABBENZ NONE DETECTED 02/02/2013 1240   AMPHETMU NONE DETECTED 02/02/2013 1240   THCU NONE DETECTED 02/02/2013 1240   LABBARB NONE DETECTED 02/02/2013 1240    Alcohol Level: No results for input(s): ETH in the last 168 hours. Urinalysis: No results for input(s): COLORURINE, LABSPEC, PHURINE, GLUCOSEU, HGBUR, BILIRUBINUR, KETONESUR, PROTEINUR, UROBILINOGEN, NITRITE, LEUKOCYTESUR in the last 168 hours.  Invalid input(s): APPERANCEUR Misc. Labs:   Micro Results: Recent Results (from the past 240 hour(s))  Blood culture (routine x 2)     Status: None (Preliminary result)   Collection Time: 11/12/15  8:25 PM  Result Value Ref Range Status   Specimen Description BLOOD RIGHT ANTECUBITAL   Final   Special Requests BOTTLES DRAWN AEROBIC ONLY 10CC  Final   Culture NO GROWTH < 24 HOURS  Final   Report Status PENDING  Incomplete  Blood culture (routine x 2)     Status: None (Preliminary result)   Collection Time: 11/12/15  8:30 PM  Result Value Ref Range Status   Specimen Description BLOOD RIGHT HAND  Final   Special Requests IN PEDIATRIC BOTTLE 3CC  Final   Culture NO GROWTH < 24 HOURS  Final   Report Status PENDING  Incomplete  MRSA PCR Screening     Status: None   Collection Time: 11/12/15 11:49 PM  Result Value Ref Range Status   MRSA by PCR NEGATIVE NEGATIVE Final    Comment:        The GeneXpert MRSA Assay (FDA approved for NASAL specimens only), is one component of a comprehensive MRSA colonization surveillance program. It is not intended to diagnose MRSA infection nor to guide or monitor treatment for MRSA infections.    Studies/Results: Dg Chest 2 View  11/12/2015  CLINICAL DATA:  Cough and fever. Flu-like symptoms for a week. Chills. EXAM: CHEST  2 VIEW COMPARISON:  10/31/2015 FINDINGS: Patchy bilateral airspace disease consistent with pneumonia given the history. Borderline cardiomegaly. Negative aortic and hilar contours. There may be trace left pleural effusion. No cavitary findings. IMPRESSION: Multi lobar bilateral pneumonia. Electronically Signed   By: Monte Fantasia M.D.   On: 11/12/2015 17:04   Medications: I have reviewed the patient's current medications. Scheduled Meds: . amLODipine  10 mg Oral QHS  . aspirin EC  81 mg Oral Daily  . atenolol  50 mg Oral BID  . calcitRIOL  0.5 mcg Oral Q M,W,F-HD  . calcium acetate  3,335 mg Oral TID WC  . ceFEPime (MAXIPIME) IV  2 g Intravenous Q M,W,F-1800  . darbepoetin (ARANESP) injection - DIALYSIS  100 mcg Intravenous Q Wed-HD  . diclofenac sodium  4 g Topical QID  . diphenhydrAMINE  25 mg Oral Daily  . heparin  5,000 Units Subcutaneous 3 times per day  . lisinopril  20 mg Oral QPM  . multivitamin  1  tablet Oral QHS  . norethindrone  5 mg Oral BID  . sodium chloride flush  3 mL Intravenous Q12H   Continuous Infusions:  PRN Meds:.sodium chloride, acetaminophen, calcium acetate, dextromethorphan, hydrALAZINE, HYDROcodone-homatropine, ipratropium-albuterol, ondansetron (ZOFRAN) IV, sodium chloride flush Assessment/Plan: Active Problems:   Hypertension   Sepsis ([place])   Pneumonia   HCAP (healthcare-associated pneumonia)  Ms. Harvie is a 50yo current smoker with ESRD 2/2 HTN s/p DDKT in 2010 now failed and back on MWF HD since 3/16 who presents with cough, shortness of breath, and subjective fevers for the past 2 weeks.  Multilobar CAP in a dialysis patient:  Patient initially with flu-like symptoms over ~2 weeks with initial improvement, now with acute worsening over the past few days.  Flu negative. Previously receiving Vancomycin 1500 mg load on 4/10 and Ceftazidime 2g on 4/10 at dialysis.  Blood cultures drawn at dialysis on 4/10 are so far negative. She remained febrile with mild leukocytosis, wheezing and crackles, with CXR showing bilateral multilocal PNA. MRSA screen negative, so unlikely to be MRSA. Given the clinical course, this is most likely a bacterial/atypical PNA.  Last documented fever is 9p last night, though she is now approaching hypothermia (last temp 36.1C).  Increasing white count despite 48 hours of Cefepime is concerning and warrants restarting Vancomycin.  Will also r/o C diff as possible source of infection given her healthcare exposure. [ ]  C diff - Cefepime IV  - START Vancomcyin - Duonebs q6 PRN for wheezing (note no h/o COPD, asthma) [ ]  Sputum Cx [ ]  Strep Ag - Voltaren gel PRN for post-tussive pain - Tylenol PRN for fever - Teley [ ]  U/A  ESRD (MWF) 2/2 HTN: Patient completed full session on Monday.  Breathing comfortably so does not appear volume overloaded.   - ASA, Amlodipine, Atenolol, Lisinopril - Phoslo - Nephrology following  DVT ppx -  Heparin  Dispo: Disposition is deferred at this time, awaiting improvement of current medical problems.  Anticipated discharge in approximately 2-3 day(s).   The patient does have a current PCP Mauricia Area, MD) and does need an [facility] hospital follow-up appointment after discharge.  The patient does not have transportation limitations that hinder transportation to clinic appointments.  .Services Needed at time of discharge: Y = Yes, Blank = No PT:   OT:   RN:   Equipment:   Other:     LOS: 2 days   Iline Oven, MD 11/14/2015, 9:15 AM

## 2015-11-14 NOTE — Progress Notes (Signed)
Pharmacy Antibiotic Note  Ruth Gutierrez is a 50 y.o. female admitted on 11/12/2015 with pneumonia.  Pharmacy was consulted on admission to start Vancomycin + Cefepime. Vancomycin was subsequently d/ced after the LD since MRSA PCR neg and has has been re-ordered to resume on 4/12.  Upon arrival to the ED on 4/10 - the patient was asked if they had been receiving antibiotics at the dialysis center and they denied any antibiotics being administered. As a result, the patient was re-loaded with Vancomcyin 1500 mg x 1 dose. Renal rounded on the patient on 4/11 and made note that the patient had been receiving Vancomycin at the HD center. A pre-HD Vancomycin level today resulted as SUPRAtherapeutic (VR 38 mcg/ml, goal of 15-25 mcg/ml). The patient is currently in HD - will hold the dose post-HD today and will resume maintenance doses with the next HD session.   Plan: 1. Hold Vancomycin post-HD on Wed, 4/12 2. Restart Vancomycin 750 mg/HD-MWF starting on Friday, 4/16 3. Continue Cefepime 2g post HD-MWF @ 1800 4. Will continue to follow HD schedule/duration, culture results, LOT, and antibiotic de-escalation plans   Height: 5\' 7"  (170.2 cm) Weight: 170 lb 10.2 oz (77.4 kg) IBW/kg (Calculated) : 61.6  Temp (24hrs), Avg:100.2 F (37.9 C), Min:97 F (36.1 C), Max:103.2 F (39.6 C)   Recent Labs Lab 11/12/15 1648 11/12/15 1659 11/12/15 2039 11/14/15 0737 11/14/15 0738 11/14/15 0742  WBC 11.4*  --   --   --   --  17.2*  CREATININE 5.35*  --   --  10.21*  --   --   LATICACIDVEN  --  1.02 0.84  --   --   --   VANCORANDOM  --   --   --   --  38  --     Estimated Creatinine Clearance: 7.2 mL/min (by C-G formula based on Cr of 10.21).    Allergies  Allergen Reactions  . Penicillin G Itching and Rash  . Penicillins Itching and Rash    Antimicrobials this admission: Vanc PTA >> Cefepime 4/10 >>  Dose adjustments this admission: 4/12: Pre-HD VR: 38 mcg/ml >> hold Vanc post HD x 1, then  resume normal MD  Microbiology results: 4/10 MRSA PCR >> neg 4/10 BCx >> ngtd 4/10 RCx >> 4/10 Influenza PCR >> neg  Thank you for allowing pharmacy to be a part of this patient's care.  Alycia Rossetti, PharmD, BCPS Clinical Pharmacist Pager: [phone] 11/14/2015 10:12 AM

## 2015-11-15 DIAGNOSIS — I120 Hypertensive chronic kidney disease with stage 5 chronic kidney disease or end stage renal disease: Secondary | ICD-10-CM

## 2015-11-15 LAB — EXPECTORATED SPUTUM ASSESSMENT W GRAM STAIN, RFLX TO RESP C

## 2015-11-15 LAB — CBC WITH DIFFERENTIAL/PLATELET
BASOS ABS: 0 10*3/uL (ref 0.0–0.1)
Basophils Relative: 0 %
Eosinophils Absolute: 0 10*3/uL (ref 0.0–0.7)
Eosinophils Relative: 0 %
HEMATOCRIT: 31.8 % — AB (ref 36.0–46.0)
Hemoglobin: 10.9 g/dL — ABNORMAL LOW (ref 12.0–15.0)
Lymphocytes Relative: 6 %
Lymphs Abs: 1.3 10*3/uL (ref 0.7–4.0)
MCH: 32.4 pg (ref 26.0–34.0)
MCHC: 34.3 g/dL (ref 30.0–36.0)
MCV: 94.6 fL (ref 78.0–100.0)
Monocytes Absolute: 0.7 10*3/uL (ref 0.1–1.0)
Monocytes Relative: 3 %
NEUTROS ABS: 18.6 10*3/uL — AB (ref 1.7–7.7)
Neutrophils Relative %: 91 %
PLATELETS: 325 10*3/uL (ref 150–400)
RBC: 3.36 MIL/uL — ABNORMAL LOW (ref 3.87–5.11)
RDW: 14.2 % (ref 11.5–15.5)
WBC: 20.6 10*3/uL — AB (ref 4.0–10.5)

## 2015-11-15 LAB — EXPECTORATED SPUTUM ASSESSMENT W REFEX TO RESP CULTURE

## 2015-11-15 LAB — BASIC METABOLIC PANEL
ANION GAP: 19 — AB (ref 5–15)
BUN: 43 mg/dL — ABNORMAL HIGH (ref 6–20)
CALCIUM: 9.8 mg/dL (ref 8.9–10.3)
CO2: 26 mmol/L (ref 22–32)
CREATININE: 8.47 mg/dL — AB (ref 0.44–1.00)
Chloride: 88 mmol/L — ABNORMAL LOW (ref 101–111)
GFR, EST AFRICAN AMERICAN: 6 mL/min — AB (ref >60)
GFR, EST NON AFRICAN AMERICAN: 5 mL/min — AB (ref >60)
Glucose, Bld: 120 mg/dL — ABNORMAL HIGH (ref 65–99)
Potassium: 3.9 mmol/L (ref 3.5–5.1)
Sodium: 133 mmol/L — ABNORMAL LOW (ref 135–145)

## 2015-11-15 NOTE — Progress Notes (Signed)
Subjective: Interval History: has complaints wants to take a shower.  Objective: Vital signs in last 24 hours: Temp:  [97.7 F (36.5 C)-100.8 F (38.2 C)] 100.3 F (37.9 C) (04/13 0812) Pulse Rate:  [86-98] 92 (04/13 0924) Resp:  [18-24] 18 (04/13 0924) BP: (124-183)/(63-113) 124/72 mmHg (04/13 0924) SpO2:  [96 %-98 %] 96 % (04/13 0924) Weight:  [74.6 kg (164 lb 7.4 oz)] 74.6 kg (164 lb 7.4 oz) (04/12 1130) Weight change: -1.934 kg (-4 lb 4.2 oz)  Intake/Output from previous day: 04/12 0701 - 04/13 0700 In: 3 [I.V.:3] Out: 4000  Intake/Output this shift: Total I/O In: 240 [P.O.:240] Out: -   General appearance: alert, cooperative and no distress Resp: rales RLL Cardio: S1, S2 normal and systolic murmur: holosystolic 2/6, blowing at apex GI: pos bs, striae, soft Extremities: AVF LUA  Lab Results:  Recent Labs  11/12/15 1648 11/14/15 0742  WBC 11.4* 17.2*  HGB 9.8* 9.1*  HCT 29.7* 26.0*  PLT 243 165   BMET:  Recent Labs  11/12/15 1648 11/14/15 0737  NA 137 131*  K 3.8 5.2*  CL 92* 92*  CO2 31 22  GLUCOSE 117* 128*  BUN 13 40*  CREATININE 5.35* 10.21*  CALCIUM 8.6* 9.6   No results for input(s): PTH in the last 72 hours. Iron Studies: No results for input(s): IRON, TIBC, TRANSFERRIN, FERRITIN in the last 72 hours.  Studies/Results: No results found.  I have reviewed the patient's current medications.  Assessment/Plan: 1 ESRD HD in am. Vol ok.solute ok 2 HPTH P ^,  Diet and meds, recheck 3 anemia  On esa. 4 Pneu AB 5 HTN better with lower vol 6 smoking counseled P HD, esa, AB,    LOS: 3 days   Blaise Grieshaber L 11/15/2015,9:31 AM

## 2015-11-15 NOTE — Progress Notes (Signed)
O2 sats 98-100% while ambulating on room air.

## 2015-11-15 NOTE — Progress Notes (Signed)
Subjective: [name].  She has been borderline febrile, but she feels better with tylenol.  Her breathing and cough are much improved compared to yesterday.  Objective: Vital signs in last 24 hours: Filed Vitals:   11/14/15 1130 11/14/15 1742 11/14/15 1942 11/15/15 0602  BP: 174/113 155/99 149/90 125/63  Pulse: 96 98 96 93  Temp: 97.7 F (36.5 C)  100.6 F (38.1 C) 100.8 F (38.2 C)  TempSrc: Oral  Oral Oral  Resp: 24 20 20 20   Height:      Weight: 164 lb 7.4 oz (74.6 kg)     SpO2: 98% 98% 96% 98%   Weight change: -4 lb 4.2 oz (-1.934 kg)  Intake/Output Summary (Last 24 hours) at 11/15/15 [id number] Last data filed at 11/14/15 2215  Gross per 24 hour  Intake      3 ml  Output   4000 ml  Net  -3997 ml   Physical Exam  Constitutional: She is oriented to person, place, and time and well-developed, well-nourished, and in no distress. No distress.  HENT:  Head: Normocephalic and atraumatic.  Eyes: EOM are normal. No scleral icterus.  Neck: No tracheal deviation present.  Cardiovascular: Normal rate, regular rhythm, normal heart sounds and intact distal pulses.   Pulmonary/Chest: Effort normal and breath sounds normal. No stridor. No respiratory distress. She has no wheezes.  Good air movement throughout.  No crackles appreciated.  Abdominal: Soft. She exhibits no distension. There is no tenderness. There is no rebound and no guarding.  Musculoskeletal:  Trace edema to midshin.  Neurological: She is alert and oriented to person, place, and time.  Skin: Skin is warm and dry. She is not diaphoretic.    Lab Results: Basic Metabolic Panel:  Recent Labs Lab 11/12/15 1648 11/14/15 0737  NA 137 131*  K 3.8 5.2*  CL 92* 92*  CO2 31 22  GLUCOSE 117* 128*  BUN 13 40*  CREATININE 5.35* 10.21*  CALCIUM 8.6* 9.6  PHOS  --  6.9*   Liver Function Tests:  Recent Labs Lab 11/12/15 1648 11/14/15 0737  AST 36  --   ALT 52  --   ALKPHOS 138*  --   BILITOT 0.8  --   PROT 7.6   --   ALBUMIN 3.1* 2.5*   No results for input(s): LIPASE, AMYLASE in the last 168 hours. No results for input(s): AMMONIA in the last 168 hours. CBC:  Recent Labs Lab 11/12/15 1648 11/14/15 0742  WBC 11.4* 17.2*  NEUTROABS 10.0* 15.6*  HGB 9.8* 9.1*  HCT 29.7* 26.0*  MCV 94.3 94.9  PLT 243 165   Cardiac Enzymes: No results for input(s): CKTOTAL, CKMB, CKMBINDEX, TROPONINI in the last 168 hours. BNP: No results for input(s): PROBNP in the last 168 hours. D-Dimer: No results for input(s): DDIMER in the last 168 hours. CBG: No results for input(s): GLUCAP in the last 168 hours. Hemoglobin A1C: No results for input(s): HGBA1C in the last 168 hours. Fasting Lipid Panel: No results for input(s): CHOL, HDL, LDLCALC, TRIG, CHOLHDL, LDLDIRECT in the last 168 hours. Thyroid Function Tests: No results for input(s): TSH, T4TOTAL, FREET4, T3FREE, THYROIDAB in the last 168 hours. Coagulation: No results for input(s): LABPROT, INR in the last 168 hours. Anemia Panel: No results for input(s): VITAMINB12, FOLATE, FERRITIN, TIBC, IRON, RETICCTPCT in the last 168 hours. Urine Drug Screen: Drugs of Abuse     Component Value Date/Time   LABOPIA NONE DETECTED 02/02/2013 1240   COCAINSCRNUR NONE DETECTED 02/02/2013  [place] 02/02/2013 1240   AMPHETMU NONE DETECTED 02/02/2013 1240   THCU NONE DETECTED 02/02/2013 1240   LABBARB NONE DETECTED 02/02/2013 1240    Alcohol Level: No results for input(s): ETH in the last 168 hours. Urinalysis: No results for input(s): COLORURINE, LABSPEC, PHURINE, GLUCOSEU, HGBUR, BILIRUBINUR, KETONESUR, PROTEINUR, UROBILINOGEN, NITRITE, LEUKOCYTESUR in the last 168 hours.  Invalid input(s): APPERANCEUR Misc. Labs:   Micro Results: Recent Results (from the past 240 hour(s))  Blood culture (routine x 2)     Status: None (Preliminary result)   Collection Time: 11/12/15  8:25 PM  Result Value Ref Range Status   Specimen Description  BLOOD RIGHT ANTECUBITAL  Final   Special Requests BOTTLES DRAWN AEROBIC ONLY 10CC  Final   Culture NO GROWTH 2 DAYS  Final   Report Status PENDING  Incomplete  Blood culture (routine x 2)     Status: None (Preliminary result)   Collection Time: 11/12/15  8:30 PM  Result Value Ref Range Status   Specimen Description BLOOD RIGHT HAND  Final   Special Requests IN PEDIATRIC BOTTLE 3CC  Final   Culture NO GROWTH 2 DAYS  Final   Report Status PENDING  Incomplete  MRSA PCR Screening     Status: None   Collection Time: 11/12/15 11:49 PM  Result Value Ref Range Status   MRSA by PCR NEGATIVE NEGATIVE Final    Comment:        The GeneXpert MRSA Assay (FDA approved for NASAL specimens only), is one component of a comprehensive MRSA colonization surveillance program. It is not intended to diagnose MRSA infection nor to guide or monitor treatment for MRSA infections.    Studies/Results: No results found. Medications: I have reviewed the patient's current medications. Scheduled Meds: . amLODipine  10 mg Oral QHS  . aspirin EC  81 mg Oral Daily  . atenolol  50 mg Oral BID  . calcitRIOL  0.5 mcg Oral Q M,W,F-HD  . calcium acetate  3,335 mg Oral TID WC  . ceFEPime (MAXIPIME) IV  2 g Intravenous Q M,W,F-1800  . darbepoetin (ARANESP) injection - DIALYSIS  100 mcg Intravenous Q Wed-HD  . diclofenac sodium  4 g Topical QID  . diphenhydrAMINE  25 mg Oral Daily  . heparin  5,000 Units Subcutaneous 3 times per day  . lisinopril  20 mg Oral QPM  . multivitamin  1 tablet Oral QHS  . norethindrone  5 mg Oral BID  . sodium chloride flush  3 mL Intravenous Q12H  . [START ON 11/16/2015] vancomycin  750 mg Intravenous Q M,W,F-HD   Continuous Infusions:  PRN Meds:.sodium chloride, acetaminophen, calcium acetate, dextromethorphan, hydrALAZINE, HYDROcodone-homatropine, ipratropium-albuterol, ondansetron (ZOFRAN) IV, sodium chloride flush Assessment/Plan: Active Problems:   Hypertension   Sepsis  ([place])   Pneumonia   HCAP (healthcare-associated pneumonia)   ESRD on dialysis [facility])  Ms. Isenberg is a 50yo current smoker with ESRD 2/2 HTN s/p DDKT in 2010 now failed and back on MWF HD since 3/16 who presents with cough, shortness of breath, and subjective fevers for the past 2 weeks.  Multilobar CAP in High Risk Patient: Patient initially with flu-like symptoms over ~2 weeks with initial improvement, now with acute worsening over the past few days.  Flu negative. Previously receiving Vancomycin 1500 mg load on 4/10 and Ceftazidime 2g on 4/10 at dialysis.  Blood cultures drawn at dialysis on 4/10 are so far negative. She remained febrile with mild leukocytosis, wheezing and crackles, with  CXR showing bilateral multilocal PNA. She has had therapeutic levels of Vancomycin since admission.  While her white count continues to increase, she is clinically markedly improved.  Therefore, will continue with current antibiotic course and monitor. [ ]  C diff - Cefepime IV  - Vancomcyin - Duonebs q6 PRN for wheezing (note no h/o COPD, asthma) [ ]  Sputum Cx [ ]  Strep Ag - Voltaren gel PRN for post-tussive pain - Tylenol PRN for fever - Teley [ ]  U/A  ESRD (MWF) 2/2 HTN: Patient completed full session on Monday.  Breathing comfortably so does not appear volume overloaded.   - ASA, Amlodipine, Atenolol, Lisinopril - Phoslo - Nephrology following  DVT ppx - Heparin  Dispo: Disposition is deferred at this time, awaiting improvement of current medical problems.  Anticipated discharge in approximately 2-3 day(s).   The patient does have a current PCP Mauricia Area, MD) and does need an [facility] hospital follow-up appointment after discharge.  The patient does not have transportation limitations that hinder transportation to clinic appointments.  .Services Needed at time of discharge: Y = Yes, Blank = No PT:   OT:   RN:   Equipment:   Other:     LOS: 3 days   Iline Oven,  MD 11/15/2015, 8:07 AM

## 2015-11-16 LAB — CBC WITH DIFFERENTIAL/PLATELET
Basophils Absolute: 0 10*3/uL (ref 0.0–0.1)
Basophils Relative: 0 %
Eosinophils Absolute: 0.3 10*3/uL (ref 0.0–0.7)
Eosinophils Relative: 2 %
HCT: 27.1 % — ABNORMAL LOW (ref 36.0–46.0)
HEMOGLOBIN: 9.3 g/dL — AB (ref 12.0–15.0)
LYMPHS ABS: 1.3 10*3/uL (ref 0.7–4.0)
LYMPHS PCT: 12 %
MCH: 32.6 pg (ref 26.0–34.0)
MCHC: 34.3 g/dL (ref 30.0–36.0)
MCV: 95.1 fL (ref 78.0–100.0)
Monocytes Absolute: 0.5 10*3/uL (ref 0.1–1.0)
Monocytes Relative: 4 %
NEUTROS PCT: 82 %
Neutro Abs: 8.8 10*3/uL — ABNORMAL HIGH (ref 1.7–7.7)
Platelets: 254 10*3/uL (ref 150–400)
RBC: 2.85 MIL/uL — AB (ref 3.87–5.11)
RDW: 14.3 % (ref 11.5–15.5)
WBC: 10.8 10*3/uL — AB (ref 4.0–10.5)

## 2015-11-16 LAB — BASIC METABOLIC PANEL
Anion gap: 14 (ref 5–15)
BUN: 54 mg/dL — AB (ref 6–20)
CHLORIDE: 91 mmol/L — AB (ref 101–111)
CO2: 28 mmol/L (ref 22–32)
Calcium: 9.3 mg/dL (ref 8.9–10.3)
Creatinine, Ser: 9.97 mg/dL — ABNORMAL HIGH (ref 0.44–1.00)
GFR calc Af Amer: 5 mL/min — ABNORMAL LOW (ref >60)
GFR calc non Af Amer: 4 mL/min — ABNORMAL LOW (ref >60)
Glucose, Bld: 110 mg/dL — ABNORMAL HIGH (ref 65–99)
Potassium: 4 mmol/L (ref 3.5–5.1)
SODIUM: 133 mmol/L — AB (ref 135–145)

## 2015-11-16 LAB — PHOSPHORUS: Phosphorus: 3.8 mg/dL (ref 2.5–4.6)

## 2015-11-16 LAB — PREALBUMIN: PREALBUMIN: 14.3 mg/dL — AB (ref 18–38)

## 2015-11-16 MED ORDER — CALCITRIOL 0.5 MCG PO CAPS: 0.5000 ug | ORAL_CAPSULE | ORAL | Status: DC

## 2015-11-16 MED ORDER — CALCITRIOL 0.25 MCG PO CAPS
ORAL_CAPSULE | ORAL | Status: AC
  Filled 2015-11-16: qty 2

## 2015-11-16 MED ORDER — PENTAFLUOROPROP-TETRAFLUOROETH EX AERO
INHALATION_SPRAY | CUTANEOUS | Status: AC
  Administered 2015-11-16: 07:00:00
  Filled 2015-11-16: qty 103.5

## 2015-11-16 MED ORDER — VARENICLINE TARTRATE 0.5 MG PO TABS
0.5000 mg | ORAL_TABLET | Freq: Every day | ORAL | Status: DC
  Filled 2015-11-16: qty 1

## 2015-11-16 MED ORDER — VARENICLINE TARTRATE 0.5 MG PO TABS: 0.5000 mg | ORAL_TABLET | Freq: Every day | ORAL | Status: DC

## 2015-11-16 NOTE — Discharge Summary (Signed)
Name: Ruth Gutierrez MRN: [id number] DOB: [birth year] 50 y.o. PCP: Mauricia Area, MD  Date of Admission: 11/12/2015  8:00 PM Date of Discharge: 11/16/2015 Attending Physician: Bartholomew Crews, MD  Discharge Diagnosis: 1. Sepsis 2/2 Multilobar Pneumonia   Active Problems:   Hypertension   Sepsis ([place])   Pneumonia   HCAP (healthcare-associated pneumonia)   ESRD on dialysis [facility])  Discharge Medications:   Medication List    STOP taking these medications        lisinopril 20 MG tablet  Commonly known as:  PRINIVIL,ZESTRIL      TAKE these medications        amLODipine 10 MG tablet  Commonly known as:  NORVASC  Take 10 mg by mouth daily.     aspirin 81 MG tablet  Take 81 mg by mouth daily.     atenolol 50 MG tablet  Commonly known as:  TENORMIN  Take 50 mg by mouth 2 (two) times daily.     calcitRIOL 0.5 MCG capsule  Commonly known as:  ROCALTROL  Take 1 capsule (0.5 mcg total) by mouth every Monday, Wednesday, and Friday with hemodialysis.     calcium acetate 667 MG capsule  Commonly known as:  PHOSLO  Take 1,334-3,335 mg by mouth 3 (three) times daily with meals. Tales 5 caps with each meal, 2 caps with snacks     diphenhydrAMINE 25 MG tablet  Commonly known as:  BENADRYL  Take 25 mg by mouth every 8 (eight) hours as needed (for cough).     norethindrone 5 MG tablet  Commonly known as:  AYGESTIN  Take 5 mg by mouth 2 (two) times daily.     varenicline 0.5 MG tablet  Commonly known as:  CHANTIX  Take 1 tablet (0.5 mg total) by mouth daily.        Disposition and follow-up:   [name] was discharged from [facility] in Good condition.  At the hospital follow up visit please address:  1.  Breathing and antibiotic completion  2.  Labs / imaging needed at time of follow-up: none  3.  Pending labs/ test needing follow-up: none  Follow-up Appointments: Follow-up Information    Follow up with DETERDING,JAMES L, MD.  Schedule an appointment as soon as possible for a visit in 2 weeks.   Specialty:  Nephrology   Contact information:   [address] [place] [address] [phone]       Discharge Instructions: Discharge Instructions    Call MD for:  difficulty breathing, headache or visual disturbances    Complete by:  As directed      Call MD for:  temperature >100.4    Complete by:  As directed      Diet - low sodium heart healthy    Complete by:  As directed      Increase activity slowly    Complete by:  As directed            Consultations: Treatment Team:  Mauricia Area, MD  Procedures Performed:  Dg Chest 2 View  11/12/2015  CLINICAL DATA:  Cough and fever. Flu-like symptoms for a week. Chills. EXAM: CHEST  2 VIEW COMPARISON:  10/31/2015 FINDINGS: Patchy bilateral airspace disease consistent with pneumonia given the history. Borderline cardiomegaly. Negative aortic and hilar contours. There may be trace left pleural effusion. No cavitary findings. IMPRESSION: Multi lobar bilateral pneumonia. Electronically Signed   By: Monte Fantasia M.D.   On: 11/12/2015 17:04  Dg Chest 2 View  10/31/2015  CLINICAL DATA:  Flu-like symptoms with abdominal pain, dry cough, shortness of breath and chest pain for 5 days. Dialysis patient. Smoker. EXAM: CHEST  2 VIEW COMPARISON:  05/01/2015 and 10/22/2014. FINDINGS: The heart size and mediastinal contours are stable. There is mild central airway thickening. No edema, confluent airspace opacity, pleural effusion or pneumothorax. Stable asymmetric glenohumeral arthropathy on the right with possible underlying posttraumatic deformity of the right humeral neck. No acute osseous findings. IMPRESSION: Mild central airway thickening suggesting bronchitis. No evidence of pneumonia. Electronically Signed   By: Richardean Sale M.D.   On: 10/31/2015 19:20    2D Echo:   Cardiac Cath:   Admission HPI: Ms. Ruth Gutierrez is a 50yo current smoker with ESRD 2/2 HTN s/p  DDKT in 2010 now failed and back on MWF HD since 3/16 who presents with cough and shortness of breath and subjective fevers for the past 2 weeks. She initially presented to the ED on 10/31/15 with flu-like symptoms including fevers, myalgias, cough, SOB, N/V/D. She was sent home and was started on antibiotics at her HD center until this past Wednesday. Her symptoms improved significantly while on antibiotics, but a few days after stopping them, since 3 days ago, she has begun spiking fevers to 103, as well as having cough productive of clear sputum, worsening shortness of breath with wheezing, myalgias, and generalized abdominal pain. She also notes some rib pain when she takes in deep breaths after coughing spells. She also says she is having pain with urination since yesterday, which is new. She denies chest pain, palpitations, vomiting, diarrhea, urinary frequency/urgency/hematuria/odor, focal weakness/numbness, worsening leg swelling, or any new rashes. She says "everyone in the house has a cold". She also continues to smoke 1/2 ppd, but no one else in the house smokes. She denies EtOH abuse or other recreational drug use.  Hospital Course by problem list: Active Problems:   Hypertension   Sepsis ([place])   Pneumonia   HCAP (healthcare-associated pneumonia)   ESRD on dialysis ([place])   CAP in Dialysis Patient: Patient was admitted after receiving Vancomycin and Ceftazidime at her outpatient dialysis center on 4/10.  Blood cultures taken at that time were negative.  When she presented to the ED, she was started on Vancomycin and Cefepime.  Repeat blood cultures remained negative during admission.  Her breathing improved with correction of her leukocytosis. She was able to ambulate on room air without desaturation.  Her Nephrologist is aware that patient will need one more week (11 day total antibiotics) course of Vancomycin and Ceftazidime (last dose 4/21) to be administered at dialysis.  Hypertension: Her  BP was managed with dialysis sessions. She was normotensive during admission.  Her Lisinopril was stopped by Nephrology prior to discharge.  Discharge Vitals:   BP 113/64 mmHg  Pulse 34  Temp(Src) 98 F (36.7 C) (Oral)  Resp 22  Ht 5\' 7"  (1.702 m)  Wt 168 lb 10.4 oz (76.5 kg)  BMI 26.41 kg/m2  SpO2 95%  LMP 06/02/2015  Discharge Labs:  Results for orders placed or performed during the hospital encounter of 11/12/15 (from the past 24 hour(s))  CBC with Differential/Platelet     Status: Abnormal   Collection Time: 11/15/15 11:15 AM  Result Value Ref Range   WBC 20.6 (H) 4.0 - 10.5 K/uL   RBC 3.36 (L) 3.87 - 5.11 MIL/uL   Hemoglobin 10.9 (L) 12.0 - 15.0 g/dL   HCT 31.8 (L) 36.0 - 46.0 %  MCV 94.6 78.0 - 100.0 fL   MCH 32.4 26.0 - 34.0 pg   MCHC 34.3 30.0 - 36.0 g/dL   RDW 14.2 11.5 - 15.5 %   Platelets 325 150 - 400 K/uL   Neutrophils Relative % 91 %   Neutro Abs 18.6 (H) 1.7 - 7.7 K/uL   Lymphocytes Relative 6 %   Lymphs Abs 1.3 0.7 - 4.0 K/uL   Monocytes Relative 3 %   Monocytes Absolute 0.7 0.1 - 1.0 K/uL   Eosinophils Relative 0 %   Eosinophils Absolute 0.0 0.0 - 0.7 K/uL   Basophils Relative 0 %   Basophils Absolute 0.0 0.0 - 0.1 K/uL  Basic metabolic panel     Status: Abnormal   Collection Time: 11/15/15 11:15 AM  Result Value Ref Range   Sodium 133 (L) 135 - 145 mmol/L   Potassium 3.9 3.5 - 5.1 mmol/L   Chloride 88 (L) 101 - 111 mmol/L   CO2 26 22 - 32 mmol/L   Glucose, Bld 120 (H) 65 - 99 mg/dL   BUN 43 (H) 6 - 20 mg/dL   Creatinine, Ser 8.47 (H) 0.44 - 1.00 mg/dL   Calcium 9.8 8.9 - 10.3 mg/dL   GFR calc non Af Amer 5 (L) >60 mL/min   GFR calc Af Amer 6 (L) >60 mL/min   Anion gap 19 (H) 5 - 15  Culture, expectorated sputum-assessment     Status: None   Collection Time: 11/15/15  5:16 PM  Result Value Ref Range   Specimen Description SPUTUM    Special Requests NONE    Sputum evaluation      MICROSCOPIC FINDINGS SUGGEST THAT THIS SPECIMEN IS NOT  REPRESENTATIVE OF LOWER RESPIRATORY SECRETIONS. PLEASE RECOLLECT. Lunette Stands RN 1826 11/15/15 A BROWNNG    Report Status 11/15/2015 FINAL   CBC with Differential/Platelet     Status: Abnormal   Collection Time: 11/16/15  5:40 AM  Result Value Ref Range   WBC 10.8 (H) 4.0 - 10.5 K/uL   RBC 2.85 (L) 3.87 - 5.11 MIL/uL   Hemoglobin 9.3 (L) 12.0 - 15.0 g/dL   HCT 27.1 (L) 36.0 - 46.0 %   MCV 95.1 78.0 - 100.0 fL   MCH 32.6 26.0 - 34.0 pg   MCHC 34.3 30.0 - 36.0 g/dL   RDW 14.3 11.5 - 15.5 %   Platelets 254 150 - 400 K/uL   Neutrophils Relative % 82 %   Neutro Abs 8.8 (H) 1.7 - 7.7 K/uL   Lymphocytes Relative 12 %   Lymphs Abs 1.3 0.7 - 4.0 K/uL   Monocytes Relative 4 %   Monocytes Absolute 0.5 0.1 - 1.0 K/uL   Eosinophils Relative 2 %   Eosinophils Absolute 0.3 0.0 - 0.7 K/uL   Basophils Relative 0 %   Basophils Absolute 0.0 0.0 - 0.1 K/uL  Basic metabolic panel     Status: Abnormal   Collection Time: 11/16/15  5:40 AM  Result Value Ref Range   Sodium 133 (L) 135 - 145 mmol/L   Potassium 4.0 3.5 - 5.1 mmol/L   Chloride 91 (L) 101 - 111 mmol/L   CO2 28 22 - 32 mmol/L   Glucose, Bld 110 (H) 65 - 99 mg/dL   BUN 54 (H) 6 - 20 mg/dL   Creatinine, Ser 9.97 (H) 0.44 - 1.00 mg/dL   Calcium 9.3 8.9 - 10.3 mg/dL   GFR calc non Af Amer 4 (L) >60 mL/min   GFR calc Af  Amer 5 (L) >60 mL/min   Anion gap 14 5 - 15  Prealbumin     Status: Abnormal   Collection Time: 11/16/15  8:00 AM  Result Value Ref Range   Prealbumin 14.3 (L) 18 - 38 mg/dL  Phosphorus     Status: None   Collection Time: 11/16/15  8:00 AM  Result Value Ref Range   Phosphorus 3.8 2.5 - 4.6 mg/dL    Signed: Iline Oven, MD 11/16/2015, 10:10 AM    Services Ordered on Discharge: none Equipment Ordered on Discharge: none

## 2015-11-16 NOTE — Procedures (Signed)
I was present at this session.  I have reviewed the session itself and made appropriate changes.  HD via RUA avf , access press ok. bp ok.   Roshawnda Pecora L 4/14/20177:52 AM

## 2015-11-16 NOTE — Progress Notes (Signed)
Discharge  Instructions reviewed with patient. Patient is going home with mother.

## 2015-11-16 NOTE — Progress Notes (Signed)
Received pt from Dialysis.

## 2015-11-16 NOTE — Progress Notes (Signed)
Subjective: Interval History: has complaints wants to go home.  Objective: Vital signs in last 24 hours: Temp:  [98 F (36.7 C)-100.3 F (37.9 C)] 98 F (36.7 C) (04/14 0700) Pulse Rate:  [68-92] 68 (04/14 0725) Resp:  [18-21] 20 (04/14 0725) BP: (115-131)/(71-86) 117/76 mmHg (04/14 0725) SpO2:  [94 %-99 %] 95 % (04/14 0700) Weight:  [72.53 kg (159 lb 14.4 oz)-76.5 kg (168 lb 10.4 oz)] 76.5 kg (168 lb 10.4 oz) (04/14 0700) Weight change: -4.87 kg (-10 lb 11.8 oz)  Intake/Output from previous day: 04/13 0701 - 04/14 0700 In: 360 [P.O.:360] Out: -  Intake/Output this shift:    General appearance: alert, cooperative and no distress Resp: rales bibasilar and wheezes LLL Cardio: S1, S2 normal and systolic murmur: holosystolic 2/6, blowing at apex GI: soft, non-tender; bowel sounds normal; no masses,  no organomegaly Extremities: AVF RUA   Lab Results:  Recent Labs  11/15/15 1115 11/16/15 0540  WBC 20.6* 10.8*  HGB 10.9* 9.3*  HCT 31.8* 27.1*  PLT 325 254   BMET:  Recent Labs  11/15/15 1115 11/16/15 0540  NA 133* 133*  K 3.9 4.0  CL 88* 91*  CO2 26 28  GLUCOSE 120* 110*  BUN 43* 54*  CREATININE 8.47* 9.97*  CALCIUM 9.8 9.3   No results for input(s): PTH in the last 72 hours. Iron Studies: No results for input(s): IRON, TIBC, TRANSFERRIN, FERRITIN in the last 72 hours.  Studies/Results: No results found.  I have reviewed the patient's current medications.  Assessment/Plan: 1 ESRD for HD, lowering vol .   2 HTN lowering vol, lowering bp , lowering meds 3 Anemia esa, Fe 4 HPTH vit D, needs better diet 5 Pneu wbc falling ,improving, agree with primary plan P HD, stop Lisin, follow bp, pneu per primary    LOS: 4 days   Gurney Balthazor L 11/16/2015,7:53 AM

## 2015-11-16 NOTE — Progress Notes (Signed)
Subjective: [name].  She feels great today and is ready to go home.  She ambulated on RA with sats 98-100%.  Objective: Vital signs in last 24 hours: Filed Vitals:   11/16/15 0725 11/16/15 0800 11/16/15 0830 11/16/15 0900  BP: 117/76 127/73 116/68 114/70  Pulse: 68 60 61 58  Temp:      TempSrc:      Resp: 20 20 19 21   Height:      Weight:      SpO2:       Weight change: -10 lb 11.8 oz (-4.87 kg)  Intake/Output Summary (Last 24 hours) at 11/16/15 0934 Last data filed at 11/15/15 1300  Gross per 24 hour  Intake    120 ml  Output      0 ml  Net    120 ml   Physical Exam  Constitutional: She is oriented to person, place, and time and well-developed, well-nourished, and in no distress. No distress.  HENT:  Head: Normocephalic and atraumatic.  Eyes: EOM are normal. No scleral icterus.  Neck: No tracheal deviation present.  Cardiovascular: Normal rate, regular rhythm, normal heart sounds and intact distal pulses.   Pulmonary/Chest: Effort normal and breath sounds normal. No stridor. No respiratory distress. She has no wheezes.  Good air movement throughout.  Minimal rhonchi over midzones.  No crackles appreciated.  Abdominal: Soft. She exhibits no distension. There is no tenderness. There is no rebound and no guarding.  Musculoskeletal:  Trace edema to midshin.  Neurological: She is alert and oriented to person, place, and time.  Skin: Skin is warm and dry. She is not diaphoretic.    Lab Results: Basic Metabolic Panel:  Recent Labs Lab 11/14/15 0737 11/15/15 1115 11/16/15 0540 11/16/15 0800  NA 131* 133* 133*  --   K 5.2* 3.9 4.0  --   CL 92* 88* 91*  --   CO2 22 26 28   --   GLUCOSE 128* 120* 110*  --   BUN 40* 43* 54*  --   CREATININE 10.21* 8.47* 9.97*  --   CALCIUM 9.6 9.8 9.3  --   PHOS 6.9*  --   --  3.8   Liver Function Tests:  Recent Labs Lab 11/12/15 1648 11/14/15 0737  AST 36  --   ALT 52  --   ALKPHOS 138*  --   BILITOT 0.8  --   PROT 7.6   --   ALBUMIN 3.1* 2.5*   No results for input(s): LIPASE, AMYLASE in the last 168 hours. No results for input(s): AMMONIA in the last 168 hours. CBC:  Recent Labs Lab 11/15/15 1115 11/16/15 0540  WBC 20.6* 10.8*  NEUTROABS 18.6* 8.8*  HGB 10.9* 9.3*  HCT 31.8* 27.1*  MCV 94.6 95.1  PLT 325 254   Cardiac Enzymes: No results for input(s): CKTOTAL, CKMB, CKMBINDEX, TROPONINI in the last 168 hours. BNP: No results for input(s): PROBNP in the last 168 hours. D-Dimer: No results for input(s): DDIMER in the last 168 hours. CBG: No results for input(s): GLUCAP in the last 168 hours. Hemoglobin A1C: No results for input(s): HGBA1C in the last 168 hours. Fasting Lipid Panel: No results for input(s): CHOL, HDL, LDLCALC, TRIG, CHOLHDL, LDLDIRECT in the last 168 hours. Thyroid Function Tests: No results for input(s): TSH, T4TOTAL, FREET4, T3FREE, THYROIDAB in the last 168 hours. Coagulation: No results for input(s): LABPROT, INR in the last 168 hours. Anemia Panel: No results for input(s): VITAMINB12, FOLATE, FERRITIN, TIBC, IRON, RETICCTPCT in  the last 168 hours. Urine Drug Screen: Drugs of Abuse     Component Value Date/Time   LABOPIA NONE DETECTED 02/02/2013 1240   COCAINSCRNUR NONE DETECTED 02/02/2013 1240   LABBENZ NONE DETECTED 02/02/2013 1240   AMPHETMU NONE DETECTED 02/02/2013 1240   THCU NONE DETECTED 02/02/2013 1240   LABBARB NONE DETECTED 02/02/2013 1240    Alcohol Level: No results for input(s): ETH in the last 168 hours. Urinalysis: No results for input(s): COLORURINE, LABSPEC, PHURINE, GLUCOSEU, HGBUR, BILIRUBINUR, KETONESUR, PROTEINUR, UROBILINOGEN, NITRITE, LEUKOCYTESUR in the last 168 hours.  Invalid input(s): APPERANCEUR Misc. Labs:   Micro Results: Recent Results (from the past 240 hour(s))  Blood culture (routine x 2)     Status: None (Preliminary result)   Collection Time: 11/12/15  8:25 PM  Result Value Ref Range Status   Specimen Description  BLOOD RIGHT ANTECUBITAL  Final   Special Requests BOTTLES DRAWN AEROBIC ONLY 10CC  Final   Culture NO GROWTH 3 DAYS  Final   Report Status PENDING  Incomplete  Blood culture (routine x 2)     Status: None (Preliminary result)   Collection Time: 11/12/15  8:30 PM  Result Value Ref Range Status   Specimen Description BLOOD RIGHT HAND  Final   Special Requests IN PEDIATRIC BOTTLE 3CC  Final   Culture NO GROWTH 3 DAYS  Final   Report Status PENDING  Incomplete  MRSA PCR Screening     Status: None   Collection Time: 11/12/15 11:49 PM  Result Value Ref Range Status   MRSA by PCR NEGATIVE NEGATIVE Final    Comment:        The GeneXpert MRSA Assay (FDA approved for NASAL specimens only), is one component of a comprehensive MRSA colonization surveillance program. It is not intended to diagnose MRSA infection nor to guide or monitor treatment for MRSA infections.   Culture, expectorated sputum-assessment     Status: None   Collection Time: 11/15/15  5:16 PM  Result Value Ref Range Status   Specimen Description SPUTUM  Final   Special Requests NONE  Final   Sputum evaluation   Final    MICROSCOPIC FINDINGS SUGGEST THAT THIS SPECIMEN IS NOT REPRESENTATIVE OF LOWER RESPIRATORY SECRETIONS. PLEASE RECOLLECT. Lunette Stands RN 1826 11/15/15 A BROWNNG    Report Status 11/15/2015 FINAL  Final   Studies/Results: No results found. Medications: I have reviewed the patient's current medications. Scheduled Meds: . amLODipine  10 mg Oral QHS  . aspirin EC  81 mg Oral Daily  . atenolol  50 mg Oral BID  . calcitRIOL      . calcitRIOL  0.5 mcg Oral Q M,W,F-HD  . calcium acetate  3,335 mg Oral TID WC  . ceFEPime (MAXIPIME) IV  2 g Intravenous Q M,W,F-1800  . darbepoetin (ARANESP) injection - DIALYSIS  100 mcg Intravenous Q Wed-HD  . diclofenac sodium  4 g Topical QID  . diphenhydrAMINE  25 mg Oral Daily  . heparin  5,000 Units Subcutaneous 3 times per day  . multivitamin  1 tablet Oral  QHS  . norethindrone  5 mg Oral BID  . sodium chloride flush  3 mL Intravenous Q12H  . vancomycin  750 mg Intravenous Q M,W,F-HD   Continuous Infusions:  PRN Meds:.sodium chloride, acetaminophen, calcium acetate, dextromethorphan, hydrALAZINE, HYDROcodone-homatropine, ipratropium-albuterol, ondansetron (ZOFRAN) IV, sodium chloride flush Assessment/Plan: Active Problems:   Hypertension   Sepsis ([place])   Pneumonia   HCAP (healthcare-associated pneumonia)   ESRD on dialysis ([place])  Ms. Derderian is a 50yo current smoker with ESRD 2/2 HTN s/p DDKT in 2010 now failed and back on MWF HD since 3/16 who presents with cough, shortness of breath, and subjective fevers for the past 2 weeks.  Multilobar CAP in High Risk Patient, improving: Patient initially with flu-like symptoms over ~2 weeks with initial improvement, now with acute worsening over the past few days.  Flu negative. Previously receiving Vancomycin 1500 mg load on 4/10 and Ceftazidime 2g on 4/10 at dialysis.  Blood cultures drawn at dialysis on 4/10 are so far negative. She remained febrile with mild leukocytosis, wheezing and crackles, with CXR showing bilateral multilocal PNA. She has had therapeutic levels of Vancomycin since admission.  White count dropped precipitously and patient's feels great and is ready for discharge.  Will continue antibiotics for a 11 day total course of antibiotics (last dose 4/21).  Nephrology PA of need for continued antibiotics and will arrange for Vancomycin and Ceftazidime to be given at dialysis. [ ]  C diff - Cefepime IV  - Vancomcyin - Duonebs q6 PRN for wheezing (note no h/o COPD, asthma) [ ]  Sputum Cx [ ]  Strep Ag - Voltaren gel PRN for post-tussive pain - Tylenol PRN for fever - [place] [ ]  U/A  ESRD (MWF) 2/2 HTN: Patient completed full session on Monday.  Breathing comfortably so does not appear volume overloaded.   - ASA, Amlodipine, Atenolol, Lisinopril - Phoslo - Nephrology following  DVT  ppx - Heparin  Dispo: Disposition is deferred at this time, awaiting improvement of current medical problems.  Anticipated discharge in approximately 2-3 day(s).   The patient does have a current PCP Mauricia Area, MD) and does need an [place] hospital follow-up appointment after discharge.  The patient does not have transportation limitations that hinder transportation to clinic appointments.  .Services Needed at time of discharge: Y = Yes, Blank = No PT:   OT:   RN:   Equipment:   Other:     LOS: 4 days   Iline Oven, MD 11/16/2015, 9:34 AM

## 2015-11-17 LAB — CULTURE, BLOOD (ROUTINE X 2)
CULTURE: NO GROWTH
CULTURE: NO GROWTH

## 2015-11-19 DIAGNOSIS — D509 Iron deficiency anemia, unspecified: Secondary | ICD-10-CM | POA: Diagnosis not present

## 2015-11-19 DIAGNOSIS — D631 Anemia in chronic kidney disease: Secondary | ICD-10-CM | POA: Diagnosis not present

## 2015-11-19 DIAGNOSIS — N2581 Secondary hyperparathyroidism of renal origin: Secondary | ICD-10-CM | POA: Diagnosis not present

## 2015-11-19 DIAGNOSIS — E1129 Type 2 diabetes mellitus with other diabetic kidney complication: Secondary | ICD-10-CM | POA: Diagnosis not present

## 2015-11-19 DIAGNOSIS — N186 End stage renal disease: Secondary | ICD-10-CM | POA: Diagnosis not present

## 2015-11-19 DIAGNOSIS — R509 Fever, unspecified: Secondary | ICD-10-CM | POA: Diagnosis not present

## 2015-11-21 DIAGNOSIS — N2581 Secondary hyperparathyroidism of renal origin: Secondary | ICD-10-CM | POA: Diagnosis not present

## 2015-11-21 DIAGNOSIS — E1129 Type 2 diabetes mellitus with other diabetic kidney complication: Secondary | ICD-10-CM | POA: Diagnosis not present

## 2015-11-21 DIAGNOSIS — D631 Anemia in chronic kidney disease: Secondary | ICD-10-CM | POA: Diagnosis not present

## 2015-11-21 DIAGNOSIS — N186 End stage renal disease: Secondary | ICD-10-CM | POA: Diagnosis not present

## 2015-11-21 DIAGNOSIS — R509 Fever, unspecified: Secondary | ICD-10-CM | POA: Diagnosis not present

## 2015-11-21 DIAGNOSIS — D509 Iron deficiency anemia, unspecified: Secondary | ICD-10-CM | POA: Diagnosis not present

## 2015-11-23 DIAGNOSIS — D509 Iron deficiency anemia, unspecified: Secondary | ICD-10-CM | POA: Diagnosis not present

## 2015-11-23 DIAGNOSIS — N186 End stage renal disease: Secondary | ICD-10-CM | POA: Diagnosis not present

## 2015-11-23 DIAGNOSIS — D631 Anemia in chronic kidney disease: Secondary | ICD-10-CM | POA: Diagnosis not present

## 2015-11-23 DIAGNOSIS — E1129 Type 2 diabetes mellitus with other diabetic kidney complication: Secondary | ICD-10-CM | POA: Diagnosis not present

## 2015-11-23 DIAGNOSIS — N2581 Secondary hyperparathyroidism of renal origin: Secondary | ICD-10-CM | POA: Diagnosis not present

## 2015-11-23 DIAGNOSIS — R509 Fever, unspecified: Secondary | ICD-10-CM | POA: Diagnosis not present

## 2015-11-26 DIAGNOSIS — E1129 Type 2 diabetes mellitus with other diabetic kidney complication: Secondary | ICD-10-CM | POA: Diagnosis not present

## 2015-11-26 DIAGNOSIS — R509 Fever, unspecified: Secondary | ICD-10-CM | POA: Diagnosis not present

## 2015-11-26 DIAGNOSIS — D509 Iron deficiency anemia, unspecified: Secondary | ICD-10-CM | POA: Diagnosis not present

## 2015-11-26 DIAGNOSIS — D631 Anemia in chronic kidney disease: Secondary | ICD-10-CM | POA: Diagnosis not present

## 2015-11-26 DIAGNOSIS — N186 End stage renal disease: Secondary | ICD-10-CM | POA: Diagnosis not present

## 2015-11-26 DIAGNOSIS — N2581 Secondary hyperparathyroidism of renal origin: Secondary | ICD-10-CM | POA: Diagnosis not present

## 2015-11-28 DIAGNOSIS — R509 Fever, unspecified: Secondary | ICD-10-CM | POA: Diagnosis not present

## 2015-11-28 DIAGNOSIS — D631 Anemia in chronic kidney disease: Secondary | ICD-10-CM | POA: Diagnosis not present

## 2015-11-28 DIAGNOSIS — N2581 Secondary hyperparathyroidism of renal origin: Secondary | ICD-10-CM | POA: Diagnosis not present

## 2015-11-28 DIAGNOSIS — N186 End stage renal disease: Secondary | ICD-10-CM | POA: Diagnosis not present

## 2015-11-28 DIAGNOSIS — D509 Iron deficiency anemia, unspecified: Secondary | ICD-10-CM | POA: Diagnosis not present

## 2015-11-28 DIAGNOSIS — E1129 Type 2 diabetes mellitus with other diabetic kidney complication: Secondary | ICD-10-CM | POA: Diagnosis not present

## 2015-11-30 DIAGNOSIS — N2581 Secondary hyperparathyroidism of renal origin: Secondary | ICD-10-CM | POA: Diagnosis not present

## 2015-11-30 DIAGNOSIS — R509 Fever, unspecified: Secondary | ICD-10-CM | POA: Diagnosis not present

## 2015-11-30 DIAGNOSIS — D631 Anemia in chronic kidney disease: Secondary | ICD-10-CM | POA: Diagnosis not present

## 2015-11-30 DIAGNOSIS — D509 Iron deficiency anemia, unspecified: Secondary | ICD-10-CM | POA: Diagnosis not present

## 2015-11-30 DIAGNOSIS — E1129 Type 2 diabetes mellitus with other diabetic kidney complication: Secondary | ICD-10-CM | POA: Diagnosis not present

## 2015-11-30 DIAGNOSIS — N186 End stage renal disease: Secondary | ICD-10-CM | POA: Diagnosis not present

## 2015-12-02 DIAGNOSIS — N186 End stage renal disease: Secondary | ICD-10-CM | POA: Diagnosis not present

## 2015-12-02 DIAGNOSIS — Z992 Dependence on renal dialysis: Secondary | ICD-10-CM | POA: Diagnosis not present

## 2015-12-02 DIAGNOSIS — I129 Hypertensive chronic kidney disease with stage 1 through stage 4 chronic kidney disease, or unspecified chronic kidney disease: Secondary | ICD-10-CM | POA: Diagnosis not present

## 2015-12-03 DIAGNOSIS — E1129 Type 2 diabetes mellitus with other diabetic kidney complication: Secondary | ICD-10-CM | POA: Diagnosis not present

## 2015-12-03 DIAGNOSIS — N2581 Secondary hyperparathyroidism of renal origin: Secondary | ICD-10-CM | POA: Diagnosis not present

## 2015-12-03 DIAGNOSIS — D631 Anemia in chronic kidney disease: Secondary | ICD-10-CM | POA: Diagnosis not present

## 2015-12-03 DIAGNOSIS — N186 End stage renal disease: Secondary | ICD-10-CM | POA: Diagnosis not present

## 2015-12-05 DIAGNOSIS — E1129 Type 2 diabetes mellitus with other diabetic kidney complication: Secondary | ICD-10-CM | POA: Diagnosis not present

## 2015-12-05 DIAGNOSIS — D631 Anemia in chronic kidney disease: Secondary | ICD-10-CM | POA: Diagnosis not present

## 2015-12-05 DIAGNOSIS — N186 End stage renal disease: Secondary | ICD-10-CM | POA: Diagnosis not present

## 2015-12-05 DIAGNOSIS — N2581 Secondary hyperparathyroidism of renal origin: Secondary | ICD-10-CM | POA: Diagnosis not present

## 2015-12-07 DIAGNOSIS — N186 End stage renal disease: Secondary | ICD-10-CM | POA: Diagnosis not present

## 2015-12-07 DIAGNOSIS — D631 Anemia in chronic kidney disease: Secondary | ICD-10-CM | POA: Diagnosis not present

## 2015-12-07 DIAGNOSIS — N2581 Secondary hyperparathyroidism of renal origin: Secondary | ICD-10-CM | POA: Diagnosis not present

## 2015-12-07 DIAGNOSIS — E1129 Type 2 diabetes mellitus with other diabetic kidney complication: Secondary | ICD-10-CM | POA: Diagnosis not present

## 2015-12-10 DIAGNOSIS — N2581 Secondary hyperparathyroidism of renal origin: Secondary | ICD-10-CM | POA: Diagnosis not present

## 2015-12-10 DIAGNOSIS — N186 End stage renal disease: Secondary | ICD-10-CM | POA: Diagnosis not present

## 2015-12-10 DIAGNOSIS — E1129 Type 2 diabetes mellitus with other diabetic kidney complication: Secondary | ICD-10-CM | POA: Diagnosis not present

## 2015-12-10 DIAGNOSIS — D631 Anemia in chronic kidney disease: Secondary | ICD-10-CM | POA: Diagnosis not present

## 2015-12-12 DIAGNOSIS — N2581 Secondary hyperparathyroidism of renal origin: Secondary | ICD-10-CM | POA: Diagnosis not present

## 2015-12-12 DIAGNOSIS — N186 End stage renal disease: Secondary | ICD-10-CM | POA: Diagnosis not present

## 2015-12-12 DIAGNOSIS — D631 Anemia in chronic kidney disease: Secondary | ICD-10-CM | POA: Diagnosis not present

## 2015-12-12 DIAGNOSIS — E1129 Type 2 diabetes mellitus with other diabetic kidney complication: Secondary | ICD-10-CM | POA: Diagnosis not present

## 2015-12-13 ENCOUNTER — Ambulatory Visit
Admission: RE | Admit: 2015-12-13 | Discharge: 2015-12-13 | Disposition: A | Discharge status: Home / Self Care | Payer: Medicare Other | Source: Ambulatory Visit | Attending: Nephrology | Admitting: Nephrology

## 2015-12-13 ENCOUNTER — Other Ambulatory Visit: Payer: Self-pay | Admitting: Nephrology

## 2015-12-13 DIAGNOSIS — R05 Cough: Secondary | ICD-10-CM

## 2015-12-13 DIAGNOSIS — R053 Chronic cough: Secondary | ICD-10-CM

## 2015-12-13 DIAGNOSIS — J189 Pneumonia, unspecified organism: Secondary | ICD-10-CM | POA: Diagnosis not present

## 2015-12-13 IMAGING — CR DG CHEST 2V
2 series · 2 of 2 positions shown · non-contrast
Comparison: [DATE]

CLINICAL DATA: Followup pneumonia

EXAM:
CHEST  2 VIEW

[w chest pa]
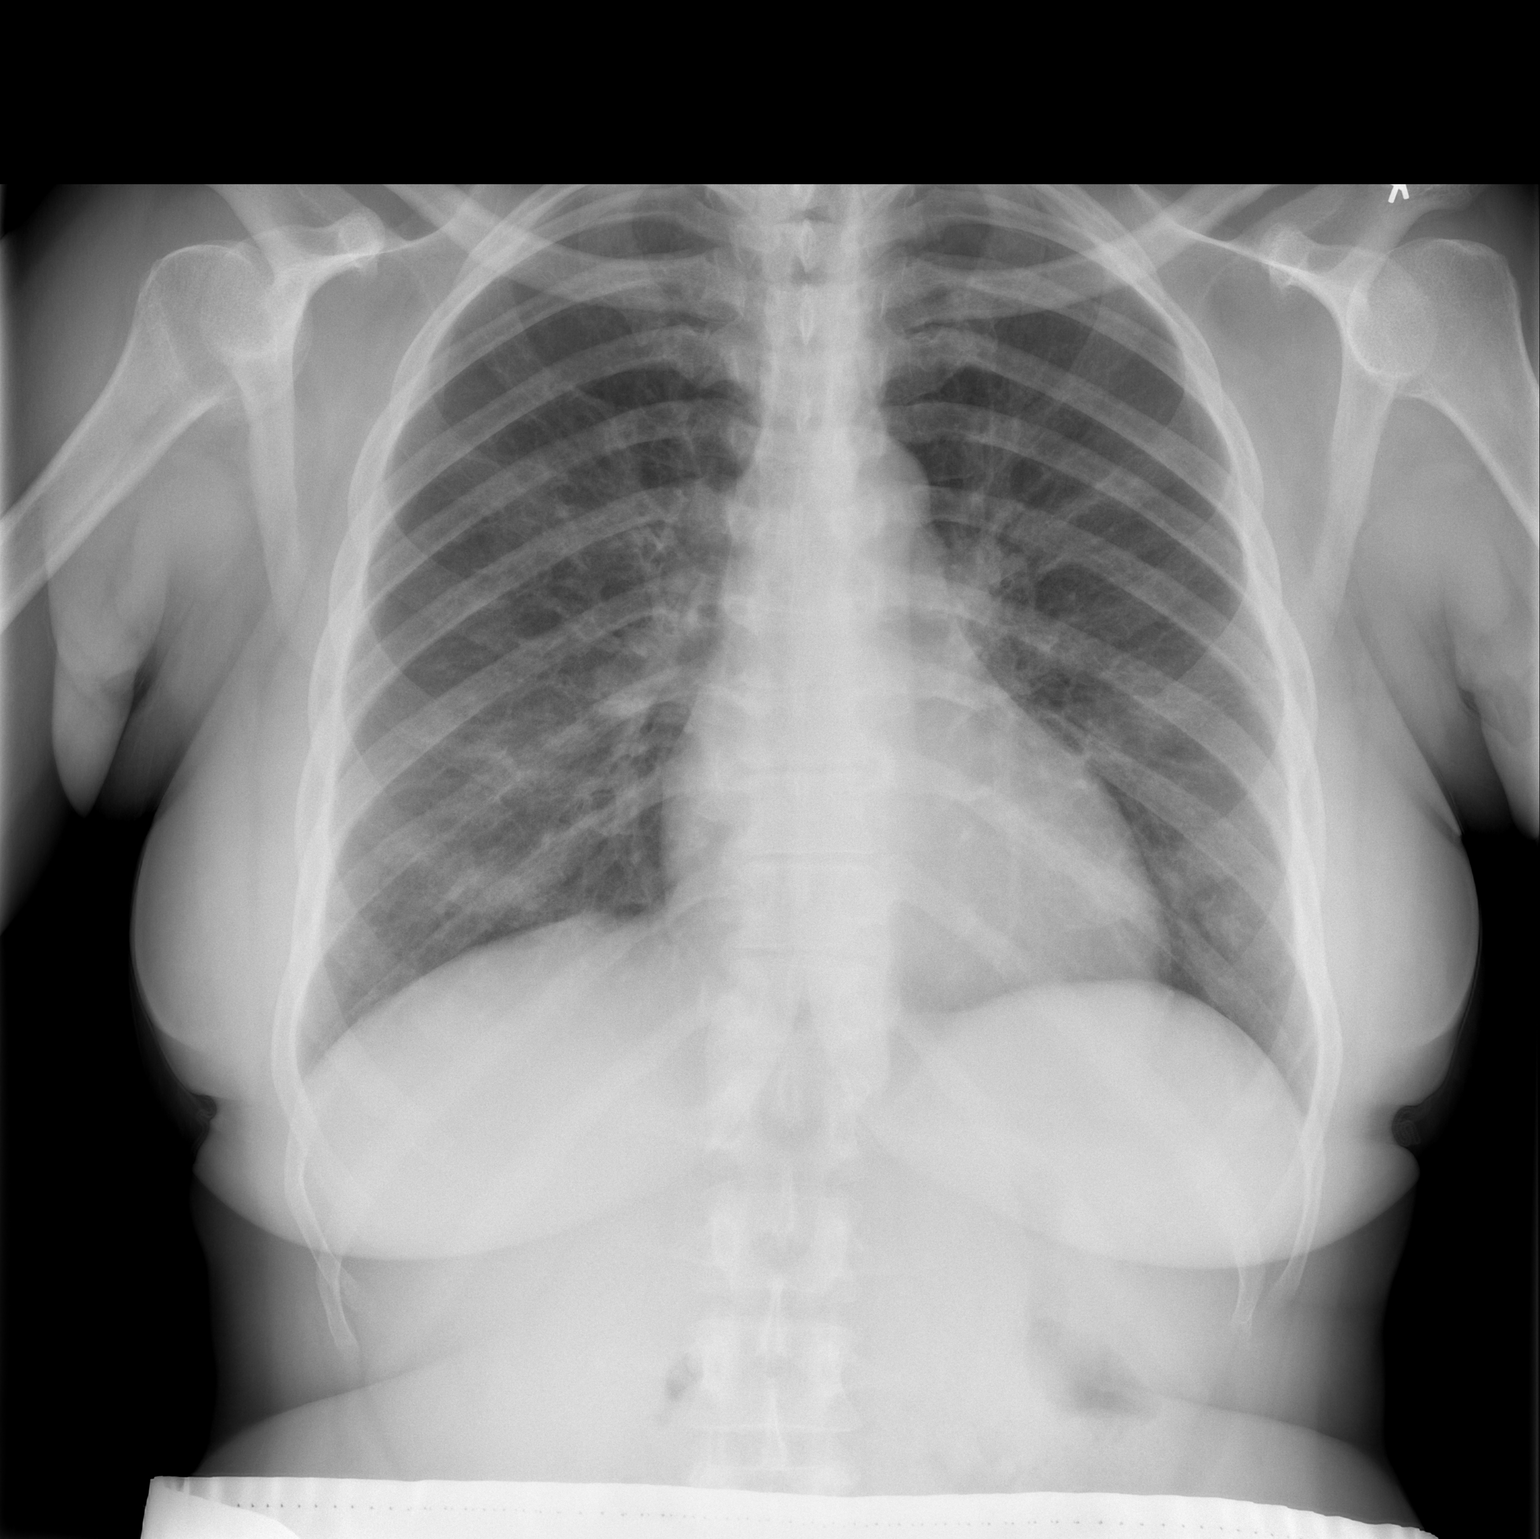

[w chest lat]
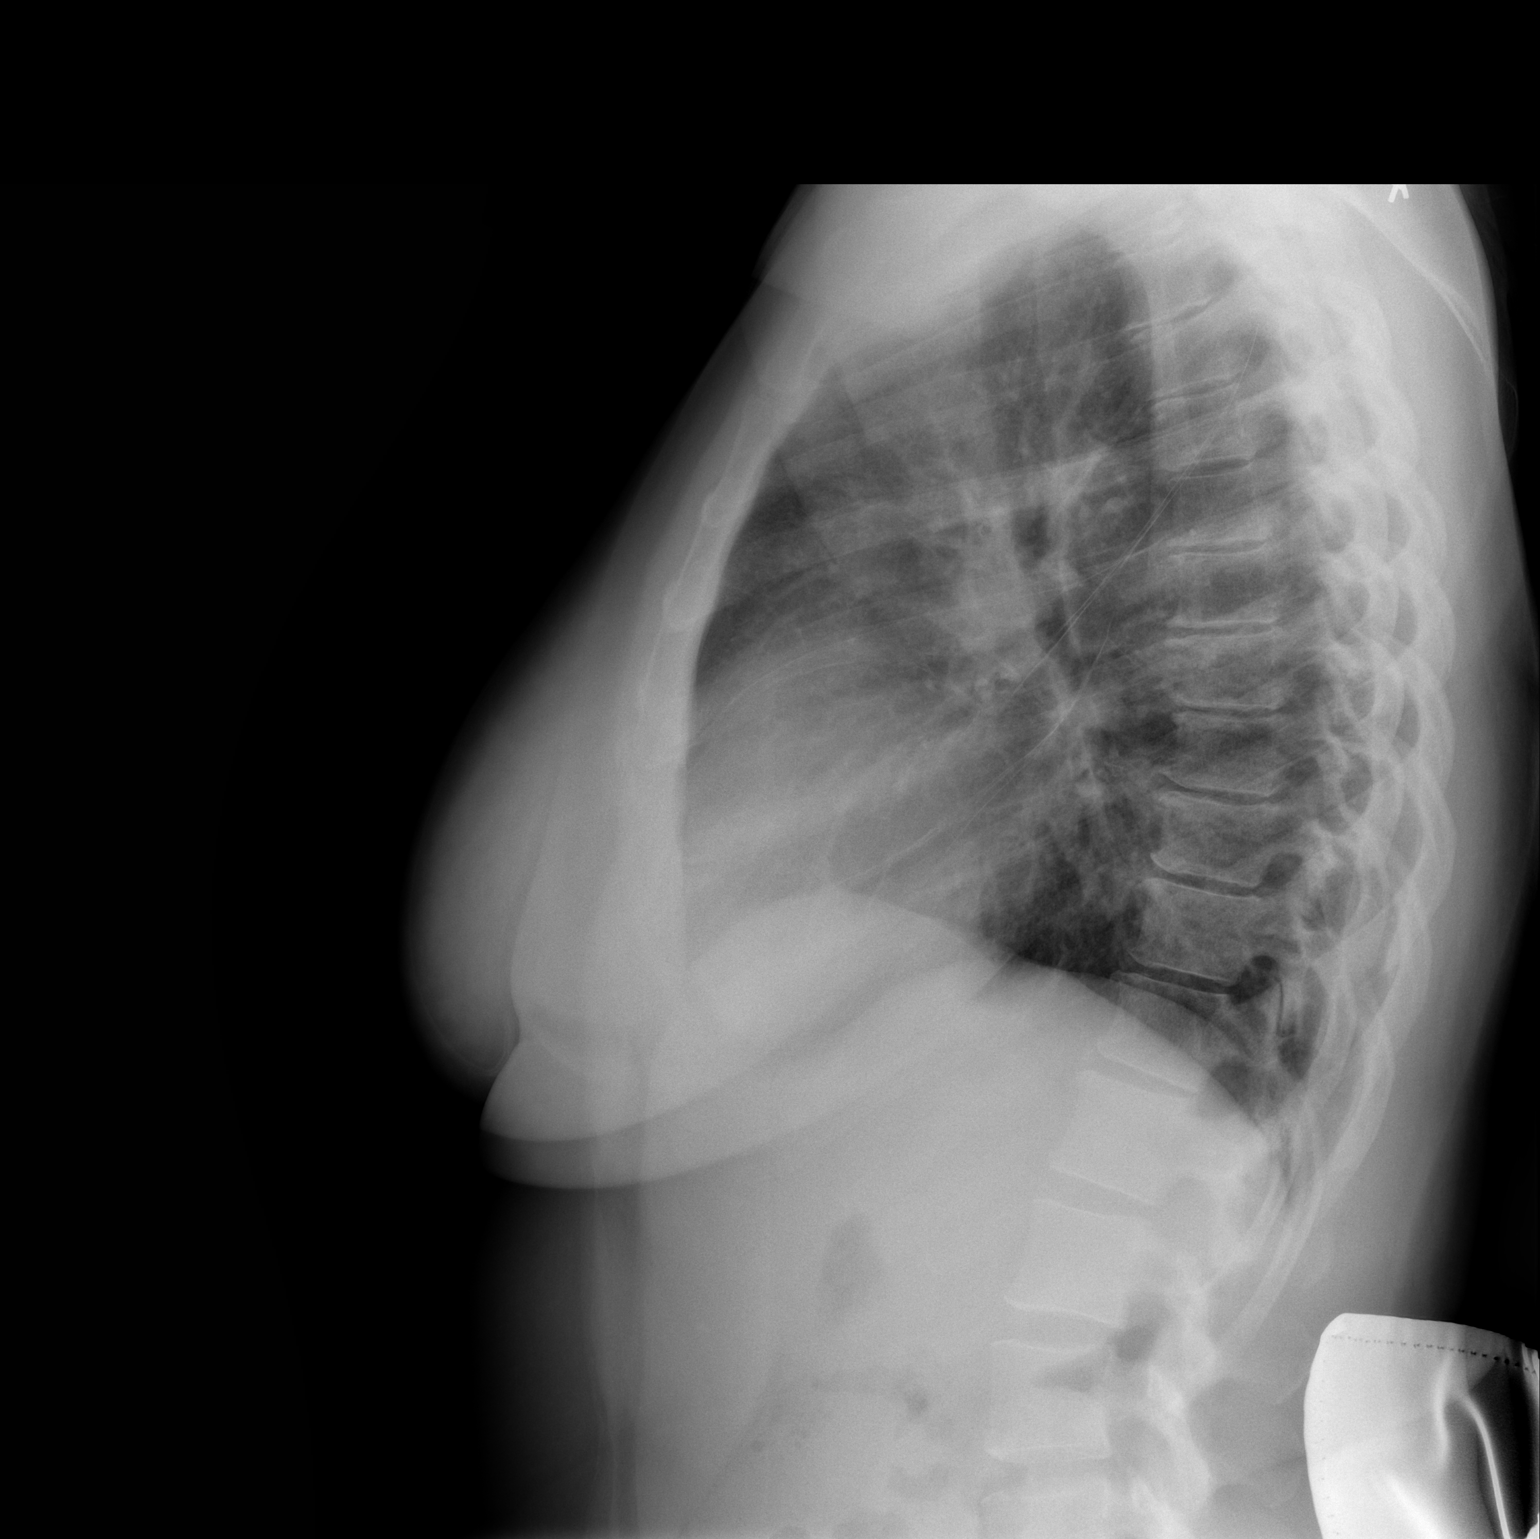

[2 of 2 positions shown; findings below may reference images not displayed]

FINDINGS: Cardiac shadow is within normal limits. The lungs are well aerated
bilaterally. Previously seen bilateral infiltrates have nearly
completely resolved. No new focal abnormality is seen. No bony
abnormality is noted.
IMPRESSION: No active cardiopulmonary disease.

## 2015-12-14 DIAGNOSIS — N2581 Secondary hyperparathyroidism of renal origin: Secondary | ICD-10-CM | POA: Diagnosis not present

## 2015-12-14 DIAGNOSIS — E1129 Type 2 diabetes mellitus with other diabetic kidney complication: Secondary | ICD-10-CM | POA: Diagnosis not present

## 2015-12-14 DIAGNOSIS — D631 Anemia in chronic kidney disease: Secondary | ICD-10-CM | POA: Diagnosis not present

## 2015-12-14 DIAGNOSIS — N186 End stage renal disease: Secondary | ICD-10-CM | POA: Diagnosis not present

## 2015-12-17 DIAGNOSIS — D631 Anemia in chronic kidney disease: Secondary | ICD-10-CM | POA: Diagnosis not present

## 2015-12-17 DIAGNOSIS — N186 End stage renal disease: Secondary | ICD-10-CM | POA: Diagnosis not present

## 2015-12-17 DIAGNOSIS — E1129 Type 2 diabetes mellitus with other diabetic kidney complication: Secondary | ICD-10-CM | POA: Diagnosis not present

## 2015-12-17 DIAGNOSIS — N2581 Secondary hyperparathyroidism of renal origin: Secondary | ICD-10-CM | POA: Diagnosis not present

## 2015-12-19 DIAGNOSIS — E1129 Type 2 diabetes mellitus with other diabetic kidney complication: Secondary | ICD-10-CM | POA: Diagnosis not present

## 2015-12-19 DIAGNOSIS — D631 Anemia in chronic kidney disease: Secondary | ICD-10-CM | POA: Diagnosis not present

## 2015-12-19 DIAGNOSIS — N186 End stage renal disease: Secondary | ICD-10-CM | POA: Diagnosis not present

## 2015-12-19 DIAGNOSIS — N2581 Secondary hyperparathyroidism of renal origin: Secondary | ICD-10-CM | POA: Diagnosis not present

## 2015-12-21 DIAGNOSIS — N186 End stage renal disease: Secondary | ICD-10-CM | POA: Diagnosis not present

## 2015-12-21 DIAGNOSIS — D631 Anemia in chronic kidney disease: Secondary | ICD-10-CM | POA: Diagnosis not present

## 2015-12-21 DIAGNOSIS — N2581 Secondary hyperparathyroidism of renal origin: Secondary | ICD-10-CM | POA: Diagnosis not present

## 2015-12-21 DIAGNOSIS — E1129 Type 2 diabetes mellitus with other diabetic kidney complication: Secondary | ICD-10-CM | POA: Diagnosis not present

## 2015-12-24 DIAGNOSIS — D631 Anemia in chronic kidney disease: Secondary | ICD-10-CM | POA: Diagnosis not present

## 2015-12-24 DIAGNOSIS — N186 End stage renal disease: Secondary | ICD-10-CM | POA: Diagnosis not present

## 2015-12-24 DIAGNOSIS — E1129 Type 2 diabetes mellitus with other diabetic kidney complication: Secondary | ICD-10-CM | POA: Diagnosis not present

## 2015-12-24 DIAGNOSIS — N2581 Secondary hyperparathyroidism of renal origin: Secondary | ICD-10-CM | POA: Diagnosis not present

## 2015-12-26 DIAGNOSIS — N2581 Secondary hyperparathyroidism of renal origin: Secondary | ICD-10-CM | POA: Diagnosis not present

## 2015-12-26 DIAGNOSIS — D631 Anemia in chronic kidney disease: Secondary | ICD-10-CM | POA: Diagnosis not present

## 2015-12-26 DIAGNOSIS — N186 End stage renal disease: Secondary | ICD-10-CM | POA: Diagnosis not present

## 2015-12-26 DIAGNOSIS — E1129 Type 2 diabetes mellitus with other diabetic kidney complication: Secondary | ICD-10-CM | POA: Diagnosis not present

## 2015-12-28 DIAGNOSIS — N2581 Secondary hyperparathyroidism of renal origin: Secondary | ICD-10-CM | POA: Diagnosis not present

## 2015-12-28 DIAGNOSIS — D631 Anemia in chronic kidney disease: Secondary | ICD-10-CM | POA: Diagnosis not present

## 2015-12-28 DIAGNOSIS — E1129 Type 2 diabetes mellitus with other diabetic kidney complication: Secondary | ICD-10-CM | POA: Diagnosis not present

## 2015-12-28 DIAGNOSIS — N186 End stage renal disease: Secondary | ICD-10-CM | POA: Diagnosis not present

## 2015-12-31 DIAGNOSIS — D631 Anemia in chronic kidney disease: Secondary | ICD-10-CM | POA: Diagnosis not present

## 2015-12-31 DIAGNOSIS — N2581 Secondary hyperparathyroidism of renal origin: Secondary | ICD-10-CM | POA: Diagnosis not present

## 2015-12-31 DIAGNOSIS — N186 End stage renal disease: Secondary | ICD-10-CM | POA: Diagnosis not present

## 2015-12-31 DIAGNOSIS — E1129 Type 2 diabetes mellitus with other diabetic kidney complication: Secondary | ICD-10-CM | POA: Diagnosis not present

## 2016-01-02 DIAGNOSIS — E1129 Type 2 diabetes mellitus with other diabetic kidney complication: Secondary | ICD-10-CM | POA: Diagnosis not present

## 2016-01-02 DIAGNOSIS — Z992 Dependence on renal dialysis: Secondary | ICD-10-CM | POA: Diagnosis not present

## 2016-01-02 DIAGNOSIS — N2581 Secondary hyperparathyroidism of renal origin: Secondary | ICD-10-CM | POA: Diagnosis not present

## 2016-01-02 DIAGNOSIS — N186 End stage renal disease: Secondary | ICD-10-CM | POA: Diagnosis not present

## 2016-01-02 DIAGNOSIS — D631 Anemia in chronic kidney disease: Secondary | ICD-10-CM | POA: Diagnosis not present

## 2016-01-02 DIAGNOSIS — I129 Hypertensive chronic kidney disease with stage 1 through stage 4 chronic kidney disease, or unspecified chronic kidney disease: Secondary | ICD-10-CM | POA: Diagnosis not present

## 2016-01-04 DIAGNOSIS — N186 End stage renal disease: Secondary | ICD-10-CM | POA: Diagnosis not present

## 2016-01-04 DIAGNOSIS — E1129 Type 2 diabetes mellitus with other diabetic kidney complication: Secondary | ICD-10-CM | POA: Diagnosis not present

## 2016-01-04 DIAGNOSIS — D631 Anemia in chronic kidney disease: Secondary | ICD-10-CM | POA: Diagnosis not present

## 2016-01-07 DIAGNOSIS — N186 End stage renal disease: Secondary | ICD-10-CM | POA: Diagnosis not present

## 2016-01-07 DIAGNOSIS — E1129 Type 2 diabetes mellitus with other diabetic kidney complication: Secondary | ICD-10-CM | POA: Diagnosis not present

## 2016-01-07 DIAGNOSIS — D631 Anemia in chronic kidney disease: Secondary | ICD-10-CM | POA: Diagnosis not present

## 2016-01-09 DIAGNOSIS — E1129 Type 2 diabetes mellitus with other diabetic kidney complication: Secondary | ICD-10-CM | POA: Diagnosis not present

## 2016-01-09 DIAGNOSIS — D631 Anemia in chronic kidney disease: Secondary | ICD-10-CM | POA: Diagnosis not present

## 2016-01-09 DIAGNOSIS — N186 End stage renal disease: Secondary | ICD-10-CM | POA: Diagnosis not present

## 2016-01-11 DIAGNOSIS — E1129 Type 2 diabetes mellitus with other diabetic kidney complication: Secondary | ICD-10-CM | POA: Diagnosis not present

## 2016-01-11 DIAGNOSIS — N186 End stage renal disease: Secondary | ICD-10-CM | POA: Diagnosis not present

## 2016-01-11 DIAGNOSIS — D631 Anemia in chronic kidney disease: Secondary | ICD-10-CM | POA: Diagnosis not present

## 2016-01-14 DIAGNOSIS — E1129 Type 2 diabetes mellitus with other diabetic kidney complication: Secondary | ICD-10-CM | POA: Diagnosis not present

## 2016-01-14 DIAGNOSIS — D631 Anemia in chronic kidney disease: Secondary | ICD-10-CM | POA: Diagnosis not present

## 2016-01-14 DIAGNOSIS — N186 End stage renal disease: Secondary | ICD-10-CM | POA: Diagnosis not present

## 2016-01-16 DIAGNOSIS — E1129 Type 2 diabetes mellitus with other diabetic kidney complication: Secondary | ICD-10-CM | POA: Diagnosis not present

## 2016-01-16 DIAGNOSIS — D631 Anemia in chronic kidney disease: Secondary | ICD-10-CM | POA: Diagnosis not present

## 2016-01-16 DIAGNOSIS — N186 End stage renal disease: Secondary | ICD-10-CM | POA: Diagnosis not present

## 2016-01-18 DIAGNOSIS — D631 Anemia in chronic kidney disease: Secondary | ICD-10-CM | POA: Diagnosis not present

## 2016-01-18 DIAGNOSIS — N186 End stage renal disease: Secondary | ICD-10-CM | POA: Diagnosis not present

## 2016-01-18 DIAGNOSIS — E1129 Type 2 diabetes mellitus with other diabetic kidney complication: Secondary | ICD-10-CM | POA: Diagnosis not present

## 2016-01-21 DIAGNOSIS — E1129 Type 2 diabetes mellitus with other diabetic kidney complication: Secondary | ICD-10-CM | POA: Diagnosis not present

## 2016-01-21 DIAGNOSIS — D631 Anemia in chronic kidney disease: Secondary | ICD-10-CM | POA: Diagnosis not present

## 2016-01-21 DIAGNOSIS — N186 End stage renal disease: Secondary | ICD-10-CM | POA: Diagnosis not present

## 2016-01-23 ENCOUNTER — Encounter: Payer: Self-pay | Admitting: Vascular Surgery

## 2016-01-23 DIAGNOSIS — N186 End stage renal disease: Secondary | ICD-10-CM | POA: Diagnosis not present

## 2016-01-23 DIAGNOSIS — D631 Anemia in chronic kidney disease: Secondary | ICD-10-CM | POA: Diagnosis not present

## 2016-01-23 DIAGNOSIS — E1129 Type 2 diabetes mellitus with other diabetic kidney complication: Secondary | ICD-10-CM | POA: Diagnosis not present

## 2016-01-25 DIAGNOSIS — D631 Anemia in chronic kidney disease: Secondary | ICD-10-CM | POA: Diagnosis not present

## 2016-01-25 DIAGNOSIS — E1129 Type 2 diabetes mellitus with other diabetic kidney complication: Secondary | ICD-10-CM | POA: Diagnosis not present

## 2016-01-25 DIAGNOSIS — N186 End stage renal disease: Secondary | ICD-10-CM | POA: Diagnosis not present

## 2016-01-28 DIAGNOSIS — N186 End stage renal disease: Secondary | ICD-10-CM | POA: Diagnosis not present

## 2016-01-28 DIAGNOSIS — E1129 Type 2 diabetes mellitus with other diabetic kidney complication: Secondary | ICD-10-CM | POA: Diagnosis not present

## 2016-01-28 DIAGNOSIS — D631 Anemia in chronic kidney disease: Secondary | ICD-10-CM | POA: Diagnosis not present

## 2016-01-29 ENCOUNTER — Encounter: Payer: Self-pay | Admitting: Vascular Surgery

## 2016-01-29 ENCOUNTER — Ambulatory Visit (INDEPENDENT_AMBULATORY_CARE_PROVIDER_SITE_OTHER): Payer: Medicare Other | Admitting: Vascular Surgery

## 2016-01-29 VITALS — BP 134/88 | HR 104 | Temp 97.5°F | Resp 18 | Ht 67.0 in | Wt 164.0 lb

## 2016-01-29 DIAGNOSIS — N186 End stage renal disease: Secondary | ICD-10-CM

## 2016-01-29 NOTE — Progress Notes (Signed)
Vascular and Vein Specialist of [facility]  Patient name: Ruth Gutierrez MRN: [id number] DOB: 08-Dec-1965 Sex: female  REASON FOR VISIT: Evaluation of left upper arm basilic vein transposition fistula  HPI: Ruth Gutierrez is a 50 y.o. female known to me from prior creation of left upper arm basilic vein transposition in June of 1 year ago. Fairly there was some concern regarding healing over access sites in her upper arm fistula. She reports that she has had no difficulty with dialysis flow in his had no thromboses of her fistula. She has had the some healing issues from the puncture site and his been told by the dialysis staff this was related to a topical cream that she uses.  Past Medical History  Diagnosis Date  . Deceased-donor kidney transplant     Performed at [facility], April 2010.  Initial ESRD due to HTN nephropathy  . Anemia of chronic disease   . History of hyperparathyroidism   . Hypertension   . Shortness of breath   . GERD (gastroesophageal reflux disease)   . Headache(784.0)   . Arthritis   . Wears glasses   . Eczema   . ESRD (end stage renal disease) ([place])     s/p transplant creatinine baseline 1.1  M/W/F dialysis  . FUO (fever of unknown origin) 05/17/2015    Family History  Problem Relation Age of Onset  . Diabetes Brother   . Deep vein thrombosis Brother   . Hypertension Mother   . Hypertension Sister   . Hyperlipidemia Sister   . Hypertension Father   . Kidney disease Brother     on HD    SOCIAL HISTORY: Social History  Substance Use Topics  . Smoking status: Current Every Day Smoker -- 0.50 packs/day for 27 years    Types: Cigarettes  . Smokeless tobacco: Never Used     Comment: 10 cigarettes a day  . Alcohol Use: No     Comment: occasional drinker noted in the past    Allergies  Allergen Reactions  . Penicillin G Itching and Rash  . Penicillins Itching and Rash    Current Outpatient  Prescriptions  Medication Sig Dispense Refill  . amLODipine (NORVASC) 10 MG tablet Take 10 mg by mouth daily.     Marland Kitchen aspirin 81 MG tablet Take 81 mg by mouth daily.     Marland Kitchen atenolol (TENORMIN) 50 MG tablet Take 50 mg by mouth 2 (two) times daily.     . calcitRIOL (ROCALTROL) 0.5 MCG capsule Take 1 capsule (0.5 mcg total) by mouth every Monday, Wednesday, and Friday with hemodialysis.    Marland Kitchen calcium acetate (PHOSLO) 667 MG capsule Take 1,334-3,335 mg by mouth 3 (three) times daily with meals. Tales 5 caps with each meal, 2 caps with snacks    . diphenhydrAMINE (BENADRYL) 25 MG tablet Take 25 mg by mouth every 8 (eight) hours as needed (for cough).    . norethindrone (AYGESTIN) 5 MG tablet Take 5 mg by mouth 2 (two) times daily.   11   No current facility-administered medications for this visit.    REVIEW OF SYSTEMS:  [X]  denotes positive finding, [ ]  denotes negative finding Cardiac  Comments:  Chest pain or chest pressure:    Shortness of breath upon exertion:    Short of breath when lying flat:    Irregular heart rhythm:        Vascular    Pain in calf, thigh, or hip brought on by ambulation:  Pain in feet at night that wakes you up from your sleep:     Blood clot in your veins:    Leg swelling:         Pulmonary    Oxygen at home:    Productive cough:     Wheezing:         Neurologic    Sudden weakness in arms or legs:     Sudden numbness in arms or legs:     Sudden onset of difficulty speaking or slurred speech:    Temporary loss of vision in one eye:     Problems with dizziness:         Gastrointestinal    Blood in stool:     Vomited blood:         Genitourinary    Burning when urinating:     Blood in urine:        Psychiatric    Major depression:         Hematologic    Bleeding problems:    Problems with blood clotting too easily:        Skin    Rashes or ulcers:        Constitutional    Fever or chills:      PHYSICAL EXAM: Filed Vitals:   01/29/16  0928  BP: 134/88  Pulse: 104  Temp: 97.5 F (36.4 C)  TempSrc: Oral  Resp: 18  Height: 5\' 7"  (1.702 m)  Weight: 164 lb (74.39 kg)  SpO2: 98%    GENERAL: The patient is a well-nourished female, in no acute distress. The vital signs are documented above. VASCULAR: Palpable left radial pulse. Has are from old upper arm cephalic vein fistula. Has a functioning basilic vein transposition fistula. All of her surgical incisions are well-healed. She does have some scarring over the puncture sites where the proximal and distal needles have entered. There is no evidence of skin breakdown and there is no evidence of aneurysm formation or erosion into the fistula. MUSCULOSKELETAL: There are no major deformities or cyanosis. NEUROLOGIC: No focal weakness or paresthesias are detected. SKIN: There are no ulcers or rashes noted. PSYCHIATRIC: The patient has a normal affect.   MEDICAL ISSUES: I discussed the this with the patient. I do not see any evidence of mechanical difficulty with her fistula. Appears to have excellent flow through this. Do not feel that this is her any skin irritation is related to topical cream. She reports that in the past there is been minimal rotation of needle access sites. Did explain the critical importance of this over her upper arm fistula. Request this with access technicians. Will see [place] again on an as-needed basis    Rosetta Posner, MD [facility] Vascular and Vein Specialists of [facility] Tel [phone] Pager [phone]

## 2016-01-30 DIAGNOSIS — N186 End stage renal disease: Secondary | ICD-10-CM | POA: Diagnosis not present

## 2016-01-30 DIAGNOSIS — E1129 Type 2 diabetes mellitus with other diabetic kidney complication: Secondary | ICD-10-CM | POA: Diagnosis not present

## 2016-01-30 DIAGNOSIS — D631 Anemia in chronic kidney disease: Secondary | ICD-10-CM | POA: Diagnosis not present

## 2016-02-01 DIAGNOSIS — D631 Anemia in chronic kidney disease: Secondary | ICD-10-CM | POA: Diagnosis not present

## 2016-02-01 DIAGNOSIS — I129 Hypertensive chronic kidney disease with stage 1 through stage 4 chronic kidney disease, or unspecified chronic kidney disease: Secondary | ICD-10-CM | POA: Diagnosis not present

## 2016-02-01 DIAGNOSIS — Z992 Dependence on renal dialysis: Secondary | ICD-10-CM | POA: Diagnosis not present

## 2016-02-01 DIAGNOSIS — N186 End stage renal disease: Secondary | ICD-10-CM | POA: Diagnosis not present

## 2016-02-01 DIAGNOSIS — E1129 Type 2 diabetes mellitus with other diabetic kidney complication: Secondary | ICD-10-CM | POA: Diagnosis not present

## 2016-02-04 DIAGNOSIS — N2581 Secondary hyperparathyroidism of renal origin: Secondary | ICD-10-CM | POA: Diagnosis not present

## 2016-02-04 DIAGNOSIS — N186 End stage renal disease: Secondary | ICD-10-CM | POA: Diagnosis not present

## 2016-02-04 DIAGNOSIS — E1129 Type 2 diabetes mellitus with other diabetic kidney complication: Secondary | ICD-10-CM | POA: Diagnosis not present

## 2016-02-06 DIAGNOSIS — N2581 Secondary hyperparathyroidism of renal origin: Secondary | ICD-10-CM | POA: Diagnosis not present

## 2016-02-06 DIAGNOSIS — E1129 Type 2 diabetes mellitus with other diabetic kidney complication: Secondary | ICD-10-CM | POA: Diagnosis not present

## 2016-02-06 DIAGNOSIS — N186 End stage renal disease: Secondary | ICD-10-CM | POA: Diagnosis not present

## 2016-02-08 DIAGNOSIS — N2581 Secondary hyperparathyroidism of renal origin: Secondary | ICD-10-CM | POA: Diagnosis not present

## 2016-02-08 DIAGNOSIS — E1129 Type 2 diabetes mellitus with other diabetic kidney complication: Secondary | ICD-10-CM | POA: Diagnosis not present

## 2016-02-08 DIAGNOSIS — N186 End stage renal disease: Secondary | ICD-10-CM | POA: Diagnosis not present

## 2016-02-11 DIAGNOSIS — N2581 Secondary hyperparathyroidism of renal origin: Secondary | ICD-10-CM | POA: Diagnosis not present

## 2016-02-11 DIAGNOSIS — N186 End stage renal disease: Secondary | ICD-10-CM | POA: Diagnosis not present

## 2016-02-11 DIAGNOSIS — E1129 Type 2 diabetes mellitus with other diabetic kidney complication: Secondary | ICD-10-CM | POA: Diagnosis not present

## 2016-02-13 DIAGNOSIS — N186 End stage renal disease: Secondary | ICD-10-CM | POA: Diagnosis not present

## 2016-02-13 DIAGNOSIS — E1129 Type 2 diabetes mellitus with other diabetic kidney complication: Secondary | ICD-10-CM | POA: Diagnosis not present

## 2016-02-13 DIAGNOSIS — N2581 Secondary hyperparathyroidism of renal origin: Secondary | ICD-10-CM | POA: Diagnosis not present

## 2016-02-15 DIAGNOSIS — N186 End stage renal disease: Secondary | ICD-10-CM | POA: Diagnosis not present

## 2016-02-15 DIAGNOSIS — E1129 Type 2 diabetes mellitus with other diabetic kidney complication: Secondary | ICD-10-CM | POA: Diagnosis not present

## 2016-02-15 DIAGNOSIS — N2581 Secondary hyperparathyroidism of renal origin: Secondary | ICD-10-CM | POA: Diagnosis not present

## 2016-02-18 DIAGNOSIS — N186 End stage renal disease: Secondary | ICD-10-CM | POA: Diagnosis not present

## 2016-02-18 DIAGNOSIS — E1129 Type 2 diabetes mellitus with other diabetic kidney complication: Secondary | ICD-10-CM | POA: Diagnosis not present

## 2016-02-18 DIAGNOSIS — N2581 Secondary hyperparathyroidism of renal origin: Secondary | ICD-10-CM | POA: Diagnosis not present

## 2016-02-20 DIAGNOSIS — N186 End stage renal disease: Secondary | ICD-10-CM | POA: Diagnosis not present

## 2016-02-20 DIAGNOSIS — N2581 Secondary hyperparathyroidism of renal origin: Secondary | ICD-10-CM | POA: Diagnosis not present

## 2016-02-20 DIAGNOSIS — E1129 Type 2 diabetes mellitus with other diabetic kidney complication: Secondary | ICD-10-CM | POA: Diagnosis not present

## 2016-02-22 DIAGNOSIS — E1129 Type 2 diabetes mellitus with other diabetic kidney complication: Secondary | ICD-10-CM | POA: Diagnosis not present

## 2016-02-22 DIAGNOSIS — N186 End stage renal disease: Secondary | ICD-10-CM | POA: Diagnosis not present

## 2016-02-22 DIAGNOSIS — N2581 Secondary hyperparathyroidism of renal origin: Secondary | ICD-10-CM | POA: Diagnosis not present

## 2016-02-25 DIAGNOSIS — N186 End stage renal disease: Secondary | ICD-10-CM | POA: Diagnosis not present

## 2016-02-25 DIAGNOSIS — E1129 Type 2 diabetes mellitus with other diabetic kidney complication: Secondary | ICD-10-CM | POA: Diagnosis not present

## 2016-02-25 DIAGNOSIS — N2581 Secondary hyperparathyroidism of renal origin: Secondary | ICD-10-CM | POA: Diagnosis not present

## 2016-02-27 DIAGNOSIS — N2581 Secondary hyperparathyroidism of renal origin: Secondary | ICD-10-CM | POA: Diagnosis not present

## 2016-02-27 DIAGNOSIS — E1129 Type 2 diabetes mellitus with other diabetic kidney complication: Secondary | ICD-10-CM | POA: Diagnosis not present

## 2016-02-27 DIAGNOSIS — N186 End stage renal disease: Secondary | ICD-10-CM | POA: Diagnosis not present

## 2016-02-29 DIAGNOSIS — E1129 Type 2 diabetes mellitus with other diabetic kidney complication: Secondary | ICD-10-CM | POA: Diagnosis not present

## 2016-02-29 DIAGNOSIS — N2581 Secondary hyperparathyroidism of renal origin: Secondary | ICD-10-CM | POA: Diagnosis not present

## 2016-02-29 DIAGNOSIS — N186 End stage renal disease: Secondary | ICD-10-CM | POA: Diagnosis not present

## 2016-03-03 DIAGNOSIS — I129 Hypertensive chronic kidney disease with stage 1 through stage 4 chronic kidney disease, or unspecified chronic kidney disease: Secondary | ICD-10-CM | POA: Diagnosis not present

## 2016-03-03 DIAGNOSIS — Z992 Dependence on renal dialysis: Secondary | ICD-10-CM | POA: Diagnosis not present

## 2016-03-03 DIAGNOSIS — N186 End stage renal disease: Secondary | ICD-10-CM | POA: Diagnosis not present

## 2016-03-03 DIAGNOSIS — N2581 Secondary hyperparathyroidism of renal origin: Secondary | ICD-10-CM | POA: Diagnosis not present

## 2016-03-03 DIAGNOSIS — E1129 Type 2 diabetes mellitus with other diabetic kidney complication: Secondary | ICD-10-CM | POA: Diagnosis not present

## 2016-03-05 DIAGNOSIS — E1129 Type 2 diabetes mellitus with other diabetic kidney complication: Secondary | ICD-10-CM | POA: Diagnosis not present

## 2016-03-05 DIAGNOSIS — N186 End stage renal disease: Secondary | ICD-10-CM | POA: Diagnosis not present

## 2016-03-05 DIAGNOSIS — N2581 Secondary hyperparathyroidism of renal origin: Secondary | ICD-10-CM | POA: Diagnosis not present

## 2016-03-05 DIAGNOSIS — D509 Iron deficiency anemia, unspecified: Secondary | ICD-10-CM | POA: Diagnosis not present

## 2016-03-07 DIAGNOSIS — N186 End stage renal disease: Secondary | ICD-10-CM | POA: Diagnosis not present

## 2016-03-07 DIAGNOSIS — D509 Iron deficiency anemia, unspecified: Secondary | ICD-10-CM | POA: Diagnosis not present

## 2016-03-07 DIAGNOSIS — N2581 Secondary hyperparathyroidism of renal origin: Secondary | ICD-10-CM | POA: Diagnosis not present

## 2016-03-07 DIAGNOSIS — E1129 Type 2 diabetes mellitus with other diabetic kidney complication: Secondary | ICD-10-CM | POA: Diagnosis not present

## 2016-03-10 DIAGNOSIS — D509 Iron deficiency anemia, unspecified: Secondary | ICD-10-CM | POA: Diagnosis not present

## 2016-03-10 DIAGNOSIS — N186 End stage renal disease: Secondary | ICD-10-CM | POA: Diagnosis not present

## 2016-03-10 DIAGNOSIS — E1129 Type 2 diabetes mellitus with other diabetic kidney complication: Secondary | ICD-10-CM | POA: Diagnosis not present

## 2016-03-10 DIAGNOSIS — N2581 Secondary hyperparathyroidism of renal origin: Secondary | ICD-10-CM | POA: Diagnosis not present

## 2016-03-12 DIAGNOSIS — E1129 Type 2 diabetes mellitus with other diabetic kidney complication: Secondary | ICD-10-CM | POA: Diagnosis not present

## 2016-03-12 DIAGNOSIS — N186 End stage renal disease: Secondary | ICD-10-CM | POA: Diagnosis not present

## 2016-03-12 DIAGNOSIS — D509 Iron deficiency anemia, unspecified: Secondary | ICD-10-CM | POA: Diagnosis not present

## 2016-03-12 DIAGNOSIS — N2581 Secondary hyperparathyroidism of renal origin: Secondary | ICD-10-CM | POA: Diagnosis not present

## 2016-03-14 DIAGNOSIS — N186 End stage renal disease: Secondary | ICD-10-CM | POA: Diagnosis not present

## 2016-03-14 DIAGNOSIS — N2581 Secondary hyperparathyroidism of renal origin: Secondary | ICD-10-CM | POA: Diagnosis not present

## 2016-03-14 DIAGNOSIS — E1129 Type 2 diabetes mellitus with other diabetic kidney complication: Secondary | ICD-10-CM | POA: Diagnosis not present

## 2016-03-14 DIAGNOSIS — D509 Iron deficiency anemia, unspecified: Secondary | ICD-10-CM | POA: Diagnosis not present

## 2016-03-17 DIAGNOSIS — N186 End stage renal disease: Secondary | ICD-10-CM | POA: Diagnosis not present

## 2016-03-17 DIAGNOSIS — D509 Iron deficiency anemia, unspecified: Secondary | ICD-10-CM | POA: Diagnosis not present

## 2016-03-17 DIAGNOSIS — N2581 Secondary hyperparathyroidism of renal origin: Secondary | ICD-10-CM | POA: Diagnosis not present

## 2016-03-17 DIAGNOSIS — E1129 Type 2 diabetes mellitus with other diabetic kidney complication: Secondary | ICD-10-CM | POA: Diagnosis not present

## 2016-03-19 DIAGNOSIS — N186 End stage renal disease: Secondary | ICD-10-CM | POA: Diagnosis not present

## 2016-03-19 DIAGNOSIS — E1129 Type 2 diabetes mellitus with other diabetic kidney complication: Secondary | ICD-10-CM | POA: Diagnosis not present

## 2016-03-19 DIAGNOSIS — D509 Iron deficiency anemia, unspecified: Secondary | ICD-10-CM | POA: Diagnosis not present

## 2016-03-19 DIAGNOSIS — N2581 Secondary hyperparathyroidism of renal origin: Secondary | ICD-10-CM | POA: Diagnosis not present

## 2016-03-21 DIAGNOSIS — D509 Iron deficiency anemia, unspecified: Secondary | ICD-10-CM | POA: Diagnosis not present

## 2016-03-21 DIAGNOSIS — N2581 Secondary hyperparathyroidism of renal origin: Secondary | ICD-10-CM | POA: Diagnosis not present

## 2016-03-21 DIAGNOSIS — E1129 Type 2 diabetes mellitus with other diabetic kidney complication: Secondary | ICD-10-CM | POA: Diagnosis not present

## 2016-03-21 DIAGNOSIS — N186 End stage renal disease: Secondary | ICD-10-CM | POA: Diagnosis not present

## 2016-03-24 DIAGNOSIS — N2581 Secondary hyperparathyroidism of renal origin: Secondary | ICD-10-CM | POA: Diagnosis not present

## 2016-03-24 DIAGNOSIS — N186 End stage renal disease: Secondary | ICD-10-CM | POA: Diagnosis not present

## 2016-03-24 DIAGNOSIS — D509 Iron deficiency anemia, unspecified: Secondary | ICD-10-CM | POA: Diagnosis not present

## 2016-03-24 DIAGNOSIS — E1129 Type 2 diabetes mellitus with other diabetic kidney complication: Secondary | ICD-10-CM | POA: Diagnosis not present

## 2016-03-26 DIAGNOSIS — E1129 Type 2 diabetes mellitus with other diabetic kidney complication: Secondary | ICD-10-CM | POA: Diagnosis not present

## 2016-03-26 DIAGNOSIS — N2581 Secondary hyperparathyroidism of renal origin: Secondary | ICD-10-CM | POA: Diagnosis not present

## 2016-03-26 DIAGNOSIS — N186 End stage renal disease: Secondary | ICD-10-CM | POA: Diagnosis not present

## 2016-03-26 DIAGNOSIS — D509 Iron deficiency anemia, unspecified: Secondary | ICD-10-CM | POA: Diagnosis not present

## 2016-03-28 DIAGNOSIS — D509 Iron deficiency anemia, unspecified: Secondary | ICD-10-CM | POA: Diagnosis not present

## 2016-03-28 DIAGNOSIS — N2581 Secondary hyperparathyroidism of renal origin: Secondary | ICD-10-CM | POA: Diagnosis not present

## 2016-03-28 DIAGNOSIS — N186 End stage renal disease: Secondary | ICD-10-CM | POA: Diagnosis not present

## 2016-03-28 DIAGNOSIS — E1129 Type 2 diabetes mellitus with other diabetic kidney complication: Secondary | ICD-10-CM | POA: Diagnosis not present

## 2016-03-31 DIAGNOSIS — D509 Iron deficiency anemia, unspecified: Secondary | ICD-10-CM | POA: Diagnosis not present

## 2016-03-31 DIAGNOSIS — E1129 Type 2 diabetes mellitus with other diabetic kidney complication: Secondary | ICD-10-CM | POA: Diagnosis not present

## 2016-03-31 DIAGNOSIS — N186 End stage renal disease: Secondary | ICD-10-CM | POA: Diagnosis not present

## 2016-03-31 DIAGNOSIS — N2581 Secondary hyperparathyroidism of renal origin: Secondary | ICD-10-CM | POA: Diagnosis not present

## 2016-04-02 DIAGNOSIS — N2581 Secondary hyperparathyroidism of renal origin: Secondary | ICD-10-CM | POA: Diagnosis not present

## 2016-04-02 DIAGNOSIS — D509 Iron deficiency anemia, unspecified: Secondary | ICD-10-CM | POA: Diagnosis not present

## 2016-04-02 DIAGNOSIS — N186 End stage renal disease: Secondary | ICD-10-CM | POA: Diagnosis not present

## 2016-04-02 DIAGNOSIS — E1129 Type 2 diabetes mellitus with other diabetic kidney complication: Secondary | ICD-10-CM | POA: Diagnosis not present

## 2016-04-03 DIAGNOSIS — N186 End stage renal disease: Secondary | ICD-10-CM | POA: Diagnosis not present

## 2016-04-03 DIAGNOSIS — I129 Hypertensive chronic kidney disease with stage 1 through stage 4 chronic kidney disease, or unspecified chronic kidney disease: Secondary | ICD-10-CM | POA: Diagnosis not present

## 2016-04-03 DIAGNOSIS — Z992 Dependence on renal dialysis: Secondary | ICD-10-CM | POA: Diagnosis not present

## 2016-04-04 DIAGNOSIS — D631 Anemia in chronic kidney disease: Secondary | ICD-10-CM | POA: Diagnosis not present

## 2016-04-04 DIAGNOSIS — N186 End stage renal disease: Secondary | ICD-10-CM | POA: Diagnosis not present

## 2016-04-04 DIAGNOSIS — Z23 Encounter for immunization: Secondary | ICD-10-CM | POA: Diagnosis not present

## 2016-04-04 DIAGNOSIS — D509 Iron deficiency anemia, unspecified: Secondary | ICD-10-CM | POA: Diagnosis not present

## 2016-04-04 DIAGNOSIS — E1129 Type 2 diabetes mellitus with other diabetic kidney complication: Secondary | ICD-10-CM | POA: Diagnosis not present

## 2016-04-04 DIAGNOSIS — N2581 Secondary hyperparathyroidism of renal origin: Secondary | ICD-10-CM | POA: Diagnosis not present

## 2016-04-07 DIAGNOSIS — N2581 Secondary hyperparathyroidism of renal origin: Secondary | ICD-10-CM | POA: Diagnosis not present

## 2016-04-07 DIAGNOSIS — Z23 Encounter for immunization: Secondary | ICD-10-CM | POA: Diagnosis not present

## 2016-04-07 DIAGNOSIS — N186 End stage renal disease: Secondary | ICD-10-CM | POA: Diagnosis not present

## 2016-04-07 DIAGNOSIS — E1129 Type 2 diabetes mellitus with other diabetic kidney complication: Secondary | ICD-10-CM | POA: Diagnosis not present

## 2016-04-07 DIAGNOSIS — D509 Iron deficiency anemia, unspecified: Secondary | ICD-10-CM | POA: Diagnosis not present

## 2016-04-07 DIAGNOSIS — D631 Anemia in chronic kidney disease: Secondary | ICD-10-CM | POA: Diagnosis not present

## 2016-04-09 DIAGNOSIS — Z23 Encounter for immunization: Secondary | ICD-10-CM | POA: Diagnosis not present

## 2016-04-09 DIAGNOSIS — N2581 Secondary hyperparathyroidism of renal origin: Secondary | ICD-10-CM | POA: Diagnosis not present

## 2016-04-09 DIAGNOSIS — D509 Iron deficiency anemia, unspecified: Secondary | ICD-10-CM | POA: Diagnosis not present

## 2016-04-09 DIAGNOSIS — N186 End stage renal disease: Secondary | ICD-10-CM | POA: Diagnosis not present

## 2016-04-09 DIAGNOSIS — E1129 Type 2 diabetes mellitus with other diabetic kidney complication: Secondary | ICD-10-CM | POA: Diagnosis not present

## 2016-04-09 DIAGNOSIS — D631 Anemia in chronic kidney disease: Secondary | ICD-10-CM | POA: Diagnosis not present

## 2016-04-11 DIAGNOSIS — N2581 Secondary hyperparathyroidism of renal origin: Secondary | ICD-10-CM | POA: Diagnosis not present

## 2016-04-11 DIAGNOSIS — D509 Iron deficiency anemia, unspecified: Secondary | ICD-10-CM | POA: Diagnosis not present

## 2016-04-11 DIAGNOSIS — D631 Anemia in chronic kidney disease: Secondary | ICD-10-CM | POA: Diagnosis not present

## 2016-04-11 DIAGNOSIS — Z23 Encounter for immunization: Secondary | ICD-10-CM | POA: Diagnosis not present

## 2016-04-11 DIAGNOSIS — E1129 Type 2 diabetes mellitus with other diabetic kidney complication: Secondary | ICD-10-CM | POA: Diagnosis not present

## 2016-04-11 DIAGNOSIS — N186 End stage renal disease: Secondary | ICD-10-CM | POA: Diagnosis not present

## 2016-04-14 DIAGNOSIS — D509 Iron deficiency anemia, unspecified: Secondary | ICD-10-CM | POA: Diagnosis not present

## 2016-04-14 DIAGNOSIS — N186 End stage renal disease: Secondary | ICD-10-CM | POA: Diagnosis not present

## 2016-04-14 DIAGNOSIS — E1129 Type 2 diabetes mellitus with other diabetic kidney complication: Secondary | ICD-10-CM | POA: Diagnosis not present

## 2016-04-14 DIAGNOSIS — D631 Anemia in chronic kidney disease: Secondary | ICD-10-CM | POA: Diagnosis not present

## 2016-04-14 DIAGNOSIS — Z23 Encounter for immunization: Secondary | ICD-10-CM | POA: Diagnosis not present

## 2016-04-14 DIAGNOSIS — N2581 Secondary hyperparathyroidism of renal origin: Secondary | ICD-10-CM | POA: Diagnosis not present

## 2016-04-16 DIAGNOSIS — D631 Anemia in chronic kidney disease: Secondary | ICD-10-CM | POA: Diagnosis not present

## 2016-04-16 DIAGNOSIS — E1129 Type 2 diabetes mellitus with other diabetic kidney complication: Secondary | ICD-10-CM | POA: Diagnosis not present

## 2016-04-16 DIAGNOSIS — Z23 Encounter for immunization: Secondary | ICD-10-CM | POA: Diagnosis not present

## 2016-04-16 DIAGNOSIS — N186 End stage renal disease: Secondary | ICD-10-CM | POA: Diagnosis not present

## 2016-04-16 DIAGNOSIS — D509 Iron deficiency anemia, unspecified: Secondary | ICD-10-CM | POA: Diagnosis not present

## 2016-04-16 DIAGNOSIS — N2581 Secondary hyperparathyroidism of renal origin: Secondary | ICD-10-CM | POA: Diagnosis not present

## 2016-04-18 DIAGNOSIS — E1129 Type 2 diabetes mellitus with other diabetic kidney complication: Secondary | ICD-10-CM | POA: Diagnosis not present

## 2016-04-18 DIAGNOSIS — D509 Iron deficiency anemia, unspecified: Secondary | ICD-10-CM | POA: Diagnosis not present

## 2016-04-18 DIAGNOSIS — D631 Anemia in chronic kidney disease: Secondary | ICD-10-CM | POA: Diagnosis not present

## 2016-04-18 DIAGNOSIS — Z23 Encounter for immunization: Secondary | ICD-10-CM | POA: Diagnosis not present

## 2016-04-18 DIAGNOSIS — N186 End stage renal disease: Secondary | ICD-10-CM | POA: Diagnosis not present

## 2016-04-18 DIAGNOSIS — N2581 Secondary hyperparathyroidism of renal origin: Secondary | ICD-10-CM | POA: Diagnosis not present

## 2016-04-21 DIAGNOSIS — D509 Iron deficiency anemia, unspecified: Secondary | ICD-10-CM | POA: Diagnosis not present

## 2016-04-21 DIAGNOSIS — E1129 Type 2 diabetes mellitus with other diabetic kidney complication: Secondary | ICD-10-CM | POA: Diagnosis not present

## 2016-04-21 DIAGNOSIS — D631 Anemia in chronic kidney disease: Secondary | ICD-10-CM | POA: Diagnosis not present

## 2016-04-21 DIAGNOSIS — N186 End stage renal disease: Secondary | ICD-10-CM | POA: Diagnosis not present

## 2016-04-21 DIAGNOSIS — Z23 Encounter for immunization: Secondary | ICD-10-CM | POA: Diagnosis not present

## 2016-04-21 DIAGNOSIS — N2581 Secondary hyperparathyroidism of renal origin: Secondary | ICD-10-CM | POA: Diagnosis not present

## 2016-04-23 DIAGNOSIS — D509 Iron deficiency anemia, unspecified: Secondary | ICD-10-CM | POA: Diagnosis not present

## 2016-04-23 DIAGNOSIS — Z23 Encounter for immunization: Secondary | ICD-10-CM | POA: Diagnosis not present

## 2016-04-23 DIAGNOSIS — E1129 Type 2 diabetes mellitus with other diabetic kidney complication: Secondary | ICD-10-CM | POA: Diagnosis not present

## 2016-04-23 DIAGNOSIS — D631 Anemia in chronic kidney disease: Secondary | ICD-10-CM | POA: Diagnosis not present

## 2016-04-23 DIAGNOSIS — N2581 Secondary hyperparathyroidism of renal origin: Secondary | ICD-10-CM | POA: Diagnosis not present

## 2016-04-23 DIAGNOSIS — N186 End stage renal disease: Secondary | ICD-10-CM | POA: Diagnosis not present

## 2016-04-25 DIAGNOSIS — D631 Anemia in chronic kidney disease: Secondary | ICD-10-CM | POA: Diagnosis not present

## 2016-04-25 DIAGNOSIS — E1129 Type 2 diabetes mellitus with other diabetic kidney complication: Secondary | ICD-10-CM | POA: Diagnosis not present

## 2016-04-25 DIAGNOSIS — Z23 Encounter for immunization: Secondary | ICD-10-CM | POA: Diagnosis not present

## 2016-04-25 DIAGNOSIS — N186 End stage renal disease: Secondary | ICD-10-CM | POA: Diagnosis not present

## 2016-04-25 DIAGNOSIS — D509 Iron deficiency anemia, unspecified: Secondary | ICD-10-CM | POA: Diagnosis not present

## 2016-04-25 DIAGNOSIS — N2581 Secondary hyperparathyroidism of renal origin: Secondary | ICD-10-CM | POA: Diagnosis not present

## 2016-04-28 DIAGNOSIS — D509 Iron deficiency anemia, unspecified: Secondary | ICD-10-CM | POA: Diagnosis not present

## 2016-04-28 DIAGNOSIS — N186 End stage renal disease: Secondary | ICD-10-CM | POA: Diagnosis not present

## 2016-04-28 DIAGNOSIS — D631 Anemia in chronic kidney disease: Secondary | ICD-10-CM | POA: Diagnosis not present

## 2016-04-28 DIAGNOSIS — N2581 Secondary hyperparathyroidism of renal origin: Secondary | ICD-10-CM | POA: Diagnosis not present

## 2016-04-28 DIAGNOSIS — E1129 Type 2 diabetes mellitus with other diabetic kidney complication: Secondary | ICD-10-CM | POA: Diagnosis not present

## 2016-04-28 DIAGNOSIS — Z23 Encounter for immunization: Secondary | ICD-10-CM | POA: Diagnosis not present

## 2016-04-30 DIAGNOSIS — N186 End stage renal disease: Secondary | ICD-10-CM | POA: Diagnosis not present

## 2016-04-30 DIAGNOSIS — D509 Iron deficiency anemia, unspecified: Secondary | ICD-10-CM | POA: Diagnosis not present

## 2016-04-30 DIAGNOSIS — Z23 Encounter for immunization: Secondary | ICD-10-CM | POA: Diagnosis not present

## 2016-04-30 DIAGNOSIS — E1129 Type 2 diabetes mellitus with other diabetic kidney complication: Secondary | ICD-10-CM | POA: Diagnosis not present

## 2016-04-30 DIAGNOSIS — N2581 Secondary hyperparathyroidism of renal origin: Secondary | ICD-10-CM | POA: Diagnosis not present

## 2016-04-30 DIAGNOSIS — D631 Anemia in chronic kidney disease: Secondary | ICD-10-CM | POA: Diagnosis not present

## 2016-05-02 DIAGNOSIS — D509 Iron deficiency anemia, unspecified: Secondary | ICD-10-CM | POA: Diagnosis not present

## 2016-05-02 DIAGNOSIS — Z23 Encounter for immunization: Secondary | ICD-10-CM | POA: Diagnosis not present

## 2016-05-02 DIAGNOSIS — E1129 Type 2 diabetes mellitus with other diabetic kidney complication: Secondary | ICD-10-CM | POA: Diagnosis not present

## 2016-05-02 DIAGNOSIS — N186 End stage renal disease: Secondary | ICD-10-CM | POA: Diagnosis not present

## 2016-05-02 DIAGNOSIS — D631 Anemia in chronic kidney disease: Secondary | ICD-10-CM | POA: Diagnosis not present

## 2016-05-02 DIAGNOSIS — N2581 Secondary hyperparathyroidism of renal origin: Secondary | ICD-10-CM | POA: Diagnosis not present

## 2016-05-03 DIAGNOSIS — Z992 Dependence on renal dialysis: Secondary | ICD-10-CM | POA: Diagnosis not present

## 2016-05-03 DIAGNOSIS — I129 Hypertensive chronic kidney disease with stage 1 through stage 4 chronic kidney disease, or unspecified chronic kidney disease: Secondary | ICD-10-CM | POA: Diagnosis not present

## 2016-05-03 DIAGNOSIS — N186 End stage renal disease: Secondary | ICD-10-CM | POA: Diagnosis not present

## 2016-05-05 DIAGNOSIS — D509 Iron deficiency anemia, unspecified: Secondary | ICD-10-CM | POA: Diagnosis not present

## 2016-05-05 DIAGNOSIS — D631 Anemia in chronic kidney disease: Secondary | ICD-10-CM | POA: Diagnosis not present

## 2016-05-05 DIAGNOSIS — N2581 Secondary hyperparathyroidism of renal origin: Secondary | ICD-10-CM | POA: Diagnosis not present

## 2016-05-05 DIAGNOSIS — E1129 Type 2 diabetes mellitus with other diabetic kidney complication: Secondary | ICD-10-CM | POA: Diagnosis not present

## 2016-05-05 DIAGNOSIS — N186 End stage renal disease: Secondary | ICD-10-CM | POA: Diagnosis not present

## 2016-05-07 DIAGNOSIS — D509 Iron deficiency anemia, unspecified: Secondary | ICD-10-CM | POA: Diagnosis not present

## 2016-05-07 DIAGNOSIS — N186 End stage renal disease: Secondary | ICD-10-CM | POA: Diagnosis not present

## 2016-05-07 DIAGNOSIS — D631 Anemia in chronic kidney disease: Secondary | ICD-10-CM | POA: Diagnosis not present

## 2016-05-07 DIAGNOSIS — N2581 Secondary hyperparathyroidism of renal origin: Secondary | ICD-10-CM | POA: Diagnosis not present

## 2016-05-07 DIAGNOSIS — E1129 Type 2 diabetes mellitus with other diabetic kidney complication: Secondary | ICD-10-CM | POA: Diagnosis not present

## 2016-05-09 DIAGNOSIS — E1129 Type 2 diabetes mellitus with other diabetic kidney complication: Secondary | ICD-10-CM | POA: Diagnosis not present

## 2016-05-09 DIAGNOSIS — D631 Anemia in chronic kidney disease: Secondary | ICD-10-CM | POA: Diagnosis not present

## 2016-05-09 DIAGNOSIS — N186 End stage renal disease: Secondary | ICD-10-CM | POA: Diagnosis not present

## 2016-05-09 DIAGNOSIS — D509 Iron deficiency anemia, unspecified: Secondary | ICD-10-CM | POA: Diagnosis not present

## 2016-05-09 DIAGNOSIS — N2581 Secondary hyperparathyroidism of renal origin: Secondary | ICD-10-CM | POA: Diagnosis not present

## 2016-05-12 DIAGNOSIS — D631 Anemia in chronic kidney disease: Secondary | ICD-10-CM | POA: Diagnosis not present

## 2016-05-12 DIAGNOSIS — N186 End stage renal disease: Secondary | ICD-10-CM | POA: Diagnosis not present

## 2016-05-12 DIAGNOSIS — D509 Iron deficiency anemia, unspecified: Secondary | ICD-10-CM | POA: Diagnosis not present

## 2016-05-12 DIAGNOSIS — E1129 Type 2 diabetes mellitus with other diabetic kidney complication: Secondary | ICD-10-CM | POA: Diagnosis not present

## 2016-05-12 DIAGNOSIS — N2581 Secondary hyperparathyroidism of renal origin: Secondary | ICD-10-CM | POA: Diagnosis not present

## 2016-05-14 DIAGNOSIS — N186 End stage renal disease: Secondary | ICD-10-CM | POA: Diagnosis not present

## 2016-05-14 DIAGNOSIS — N2581 Secondary hyperparathyroidism of renal origin: Secondary | ICD-10-CM | POA: Diagnosis not present

## 2016-05-14 DIAGNOSIS — D509 Iron deficiency anemia, unspecified: Secondary | ICD-10-CM | POA: Diagnosis not present

## 2016-05-14 DIAGNOSIS — D631 Anemia in chronic kidney disease: Secondary | ICD-10-CM | POA: Diagnosis not present

## 2016-05-14 DIAGNOSIS — E1129 Type 2 diabetes mellitus with other diabetic kidney complication: Secondary | ICD-10-CM | POA: Diagnosis not present

## 2016-05-16 DIAGNOSIS — D631 Anemia in chronic kidney disease: Secondary | ICD-10-CM | POA: Diagnosis not present

## 2016-05-16 DIAGNOSIS — N186 End stage renal disease: Secondary | ICD-10-CM | POA: Diagnosis not present

## 2016-05-16 DIAGNOSIS — D509 Iron deficiency anemia, unspecified: Secondary | ICD-10-CM | POA: Diagnosis not present

## 2016-05-16 DIAGNOSIS — E1129 Type 2 diabetes mellitus with other diabetic kidney complication: Secondary | ICD-10-CM | POA: Diagnosis not present

## 2016-05-16 DIAGNOSIS — N2581 Secondary hyperparathyroidism of renal origin: Secondary | ICD-10-CM | POA: Diagnosis not present

## 2016-05-19 DIAGNOSIS — D509 Iron deficiency anemia, unspecified: Secondary | ICD-10-CM | POA: Diagnosis not present

## 2016-05-19 DIAGNOSIS — E1129 Type 2 diabetes mellitus with other diabetic kidney complication: Secondary | ICD-10-CM | POA: Diagnosis not present

## 2016-05-19 DIAGNOSIS — N2581 Secondary hyperparathyroidism of renal origin: Secondary | ICD-10-CM | POA: Diagnosis not present

## 2016-05-19 DIAGNOSIS — D631 Anemia in chronic kidney disease: Secondary | ICD-10-CM | POA: Diagnosis not present

## 2016-05-19 DIAGNOSIS — N186 End stage renal disease: Secondary | ICD-10-CM | POA: Diagnosis not present

## 2016-05-21 DIAGNOSIS — N186 End stage renal disease: Secondary | ICD-10-CM | POA: Diagnosis not present

## 2016-05-21 DIAGNOSIS — N2581 Secondary hyperparathyroidism of renal origin: Secondary | ICD-10-CM | POA: Diagnosis not present

## 2016-05-21 DIAGNOSIS — D509 Iron deficiency anemia, unspecified: Secondary | ICD-10-CM | POA: Diagnosis not present

## 2016-05-21 DIAGNOSIS — E1129 Type 2 diabetes mellitus with other diabetic kidney complication: Secondary | ICD-10-CM | POA: Diagnosis not present

## 2016-05-21 DIAGNOSIS — D631 Anemia in chronic kidney disease: Secondary | ICD-10-CM | POA: Diagnosis not present

## 2016-05-23 DIAGNOSIS — D509 Iron deficiency anemia, unspecified: Secondary | ICD-10-CM | POA: Diagnosis not present

## 2016-05-23 DIAGNOSIS — N2581 Secondary hyperparathyroidism of renal origin: Secondary | ICD-10-CM | POA: Diagnosis not present

## 2016-05-23 DIAGNOSIS — E1129 Type 2 diabetes mellitus with other diabetic kidney complication: Secondary | ICD-10-CM | POA: Diagnosis not present

## 2016-05-23 DIAGNOSIS — D631 Anemia in chronic kidney disease: Secondary | ICD-10-CM | POA: Diagnosis not present

## 2016-05-23 DIAGNOSIS — N186 End stage renal disease: Secondary | ICD-10-CM | POA: Diagnosis not present

## 2016-05-26 DIAGNOSIS — D509 Iron deficiency anemia, unspecified: Secondary | ICD-10-CM | POA: Diagnosis not present

## 2016-05-26 DIAGNOSIS — N2581 Secondary hyperparathyroidism of renal origin: Secondary | ICD-10-CM | POA: Diagnosis not present

## 2016-05-26 DIAGNOSIS — N186 End stage renal disease: Secondary | ICD-10-CM | POA: Diagnosis not present

## 2016-05-26 DIAGNOSIS — D631 Anemia in chronic kidney disease: Secondary | ICD-10-CM | POA: Diagnosis not present

## 2016-05-26 DIAGNOSIS — E1129 Type 2 diabetes mellitus with other diabetic kidney complication: Secondary | ICD-10-CM | POA: Diagnosis not present

## 2016-05-28 DIAGNOSIS — E1129 Type 2 diabetes mellitus with other diabetic kidney complication: Secondary | ICD-10-CM | POA: Diagnosis not present

## 2016-05-28 DIAGNOSIS — D631 Anemia in chronic kidney disease: Secondary | ICD-10-CM | POA: Diagnosis not present

## 2016-05-28 DIAGNOSIS — D509 Iron deficiency anemia, unspecified: Secondary | ICD-10-CM | POA: Diagnosis not present

## 2016-05-28 DIAGNOSIS — N2581 Secondary hyperparathyroidism of renal origin: Secondary | ICD-10-CM | POA: Diagnosis not present

## 2016-05-28 DIAGNOSIS — N186 End stage renal disease: Secondary | ICD-10-CM | POA: Diagnosis not present

## 2016-05-30 DIAGNOSIS — N186 End stage renal disease: Secondary | ICD-10-CM | POA: Diagnosis not present

## 2016-05-30 DIAGNOSIS — D509 Iron deficiency anemia, unspecified: Secondary | ICD-10-CM | POA: Diagnosis not present

## 2016-05-30 DIAGNOSIS — E1129 Type 2 diabetes mellitus with other diabetic kidney complication: Secondary | ICD-10-CM | POA: Diagnosis not present

## 2016-05-30 DIAGNOSIS — N2581 Secondary hyperparathyroidism of renal origin: Secondary | ICD-10-CM | POA: Diagnosis not present

## 2016-05-30 DIAGNOSIS — D631 Anemia in chronic kidney disease: Secondary | ICD-10-CM | POA: Diagnosis not present

## 2016-06-02 DIAGNOSIS — E1129 Type 2 diabetes mellitus with other diabetic kidney complication: Secondary | ICD-10-CM | POA: Diagnosis not present

## 2016-06-02 DIAGNOSIS — D509 Iron deficiency anemia, unspecified: Secondary | ICD-10-CM | POA: Diagnosis not present

## 2016-06-02 DIAGNOSIS — N2581 Secondary hyperparathyroidism of renal origin: Secondary | ICD-10-CM | POA: Diagnosis not present

## 2016-06-02 DIAGNOSIS — N186 End stage renal disease: Secondary | ICD-10-CM | POA: Diagnosis not present

## 2016-06-02 DIAGNOSIS — D631 Anemia in chronic kidney disease: Secondary | ICD-10-CM | POA: Diagnosis not present

## 2016-06-03 DIAGNOSIS — Z992 Dependence on renal dialysis: Secondary | ICD-10-CM | POA: Diagnosis not present

## 2016-06-03 DIAGNOSIS — I129 Hypertensive chronic kidney disease with stage 1 through stage 4 chronic kidney disease, or unspecified chronic kidney disease: Secondary | ICD-10-CM | POA: Diagnosis not present

## 2016-06-03 DIAGNOSIS — N186 End stage renal disease: Secondary | ICD-10-CM | POA: Diagnosis not present

## 2016-06-04 DIAGNOSIS — D509 Iron deficiency anemia, unspecified: Secondary | ICD-10-CM | POA: Diagnosis not present

## 2016-06-04 DIAGNOSIS — E1129 Type 2 diabetes mellitus with other diabetic kidney complication: Secondary | ICD-10-CM | POA: Diagnosis not present

## 2016-06-04 DIAGNOSIS — N2581 Secondary hyperparathyroidism of renal origin: Secondary | ICD-10-CM | POA: Diagnosis not present

## 2016-06-04 DIAGNOSIS — N186 End stage renal disease: Secondary | ICD-10-CM | POA: Diagnosis not present

## 2016-06-06 DIAGNOSIS — E1129 Type 2 diabetes mellitus with other diabetic kidney complication: Secondary | ICD-10-CM | POA: Diagnosis not present

## 2016-06-06 DIAGNOSIS — N186 End stage renal disease: Secondary | ICD-10-CM | POA: Diagnosis not present

## 2016-06-06 DIAGNOSIS — D509 Iron deficiency anemia, unspecified: Secondary | ICD-10-CM | POA: Diagnosis not present

## 2016-06-06 DIAGNOSIS — N2581 Secondary hyperparathyroidism of renal origin: Secondary | ICD-10-CM | POA: Diagnosis not present

## 2016-06-09 DIAGNOSIS — N2581 Secondary hyperparathyroidism of renal origin: Secondary | ICD-10-CM | POA: Diagnosis not present

## 2016-06-09 DIAGNOSIS — D509 Iron deficiency anemia, unspecified: Secondary | ICD-10-CM | POA: Diagnosis not present

## 2016-06-09 DIAGNOSIS — N186 End stage renal disease: Secondary | ICD-10-CM | POA: Diagnosis not present

## 2016-06-09 DIAGNOSIS — E1129 Type 2 diabetes mellitus with other diabetic kidney complication: Secondary | ICD-10-CM | POA: Diagnosis not present

## 2016-06-11 DIAGNOSIS — E1129 Type 2 diabetes mellitus with other diabetic kidney complication: Secondary | ICD-10-CM | POA: Diagnosis not present

## 2016-06-11 DIAGNOSIS — N186 End stage renal disease: Secondary | ICD-10-CM | POA: Diagnosis not present

## 2016-06-11 DIAGNOSIS — D509 Iron deficiency anemia, unspecified: Secondary | ICD-10-CM | POA: Diagnosis not present

## 2016-06-11 DIAGNOSIS — N2581 Secondary hyperparathyroidism of renal origin: Secondary | ICD-10-CM | POA: Diagnosis not present

## 2016-06-13 DIAGNOSIS — D509 Iron deficiency anemia, unspecified: Secondary | ICD-10-CM | POA: Diagnosis not present

## 2016-06-13 DIAGNOSIS — E1129 Type 2 diabetes mellitus with other diabetic kidney complication: Secondary | ICD-10-CM | POA: Diagnosis not present

## 2016-06-13 DIAGNOSIS — N186 End stage renal disease: Secondary | ICD-10-CM | POA: Diagnosis not present

## 2016-06-13 DIAGNOSIS — N2581 Secondary hyperparathyroidism of renal origin: Secondary | ICD-10-CM | POA: Diagnosis not present

## 2016-06-16 DIAGNOSIS — N186 End stage renal disease: Secondary | ICD-10-CM | POA: Diagnosis not present

## 2016-06-16 DIAGNOSIS — D509 Iron deficiency anemia, unspecified: Secondary | ICD-10-CM | POA: Diagnosis not present

## 2016-06-16 DIAGNOSIS — N2581 Secondary hyperparathyroidism of renal origin: Secondary | ICD-10-CM | POA: Diagnosis not present

## 2016-06-16 DIAGNOSIS — E1129 Type 2 diabetes mellitus with other diabetic kidney complication: Secondary | ICD-10-CM | POA: Diagnosis not present

## 2016-06-18 DIAGNOSIS — D509 Iron deficiency anemia, unspecified: Secondary | ICD-10-CM | POA: Diagnosis not present

## 2016-06-18 DIAGNOSIS — N2581 Secondary hyperparathyroidism of renal origin: Secondary | ICD-10-CM | POA: Diagnosis not present

## 2016-06-18 DIAGNOSIS — N186 End stage renal disease: Secondary | ICD-10-CM | POA: Diagnosis not present

## 2016-06-18 DIAGNOSIS — E1129 Type 2 diabetes mellitus with other diabetic kidney complication: Secondary | ICD-10-CM | POA: Diagnosis not present

## 2016-06-20 DIAGNOSIS — N186 End stage renal disease: Secondary | ICD-10-CM | POA: Diagnosis not present

## 2016-06-20 DIAGNOSIS — E1129 Type 2 diabetes mellitus with other diabetic kidney complication: Secondary | ICD-10-CM | POA: Diagnosis not present

## 2016-06-20 DIAGNOSIS — N2581 Secondary hyperparathyroidism of renal origin: Secondary | ICD-10-CM | POA: Diagnosis not present

## 2016-06-20 DIAGNOSIS — D509 Iron deficiency anemia, unspecified: Secondary | ICD-10-CM | POA: Diagnosis not present

## 2016-06-22 DIAGNOSIS — N2581 Secondary hyperparathyroidism of renal origin: Secondary | ICD-10-CM | POA: Diagnosis not present

## 2016-06-22 DIAGNOSIS — N186 End stage renal disease: Secondary | ICD-10-CM | POA: Diagnosis not present

## 2016-06-22 DIAGNOSIS — D509 Iron deficiency anemia, unspecified: Secondary | ICD-10-CM | POA: Diagnosis not present

## 2016-06-22 DIAGNOSIS — E1129 Type 2 diabetes mellitus with other diabetic kidney complication: Secondary | ICD-10-CM | POA: Diagnosis not present

## 2016-06-24 DIAGNOSIS — N2581 Secondary hyperparathyroidism of renal origin: Secondary | ICD-10-CM | POA: Diagnosis not present

## 2016-06-24 DIAGNOSIS — D509 Iron deficiency anemia, unspecified: Secondary | ICD-10-CM | POA: Diagnosis not present

## 2016-06-24 DIAGNOSIS — E1129 Type 2 diabetes mellitus with other diabetic kidney complication: Secondary | ICD-10-CM | POA: Diagnosis not present

## 2016-06-24 DIAGNOSIS — N186 End stage renal disease: Secondary | ICD-10-CM | POA: Diagnosis not present

## 2016-06-27 DIAGNOSIS — N186 End stage renal disease: Secondary | ICD-10-CM | POA: Diagnosis not present

## 2016-06-27 DIAGNOSIS — E1129 Type 2 diabetes mellitus with other diabetic kidney complication: Secondary | ICD-10-CM | POA: Diagnosis not present

## 2016-06-27 DIAGNOSIS — D509 Iron deficiency anemia, unspecified: Secondary | ICD-10-CM | POA: Diagnosis not present

## 2016-06-27 DIAGNOSIS — N2581 Secondary hyperparathyroidism of renal origin: Secondary | ICD-10-CM | POA: Diagnosis not present

## 2016-06-30 DIAGNOSIS — N2581 Secondary hyperparathyroidism of renal origin: Secondary | ICD-10-CM | POA: Diagnosis not present

## 2016-06-30 DIAGNOSIS — E1129 Type 2 diabetes mellitus with other diabetic kidney complication: Secondary | ICD-10-CM | POA: Diagnosis not present

## 2016-06-30 DIAGNOSIS — D509 Iron deficiency anemia, unspecified: Secondary | ICD-10-CM | POA: Diagnosis not present

## 2016-06-30 DIAGNOSIS — N186 End stage renal disease: Secondary | ICD-10-CM | POA: Diagnosis not present

## 2016-07-02 DIAGNOSIS — E1129 Type 2 diabetes mellitus with other diabetic kidney complication: Secondary | ICD-10-CM | POA: Diagnosis not present

## 2016-07-02 DIAGNOSIS — D509 Iron deficiency anemia, unspecified: Secondary | ICD-10-CM | POA: Diagnosis not present

## 2016-07-02 DIAGNOSIS — N186 End stage renal disease: Secondary | ICD-10-CM | POA: Diagnosis not present

## 2016-07-02 DIAGNOSIS — N2581 Secondary hyperparathyroidism of renal origin: Secondary | ICD-10-CM | POA: Diagnosis not present

## 2016-07-03 DIAGNOSIS — N186 End stage renal disease: Secondary | ICD-10-CM | POA: Diagnosis not present

## 2016-07-03 DIAGNOSIS — I129 Hypertensive chronic kidney disease with stage 1 through stage 4 chronic kidney disease, or unspecified chronic kidney disease: Secondary | ICD-10-CM | POA: Diagnosis not present

## 2016-07-03 DIAGNOSIS — Z992 Dependence on renal dialysis: Secondary | ICD-10-CM | POA: Diagnosis not present

## 2016-07-04 DIAGNOSIS — N186 End stage renal disease: Secondary | ICD-10-CM | POA: Diagnosis not present

## 2016-07-04 DIAGNOSIS — N2581 Secondary hyperparathyroidism of renal origin: Secondary | ICD-10-CM | POA: Diagnosis not present

## 2016-07-04 DIAGNOSIS — E1129 Type 2 diabetes mellitus with other diabetic kidney complication: Secondary | ICD-10-CM | POA: Diagnosis not present

## 2016-07-07 DIAGNOSIS — N2581 Secondary hyperparathyroidism of renal origin: Secondary | ICD-10-CM | POA: Diagnosis not present

## 2016-07-07 DIAGNOSIS — E1129 Type 2 diabetes mellitus with other diabetic kidney complication: Secondary | ICD-10-CM | POA: Diagnosis not present

## 2016-07-07 DIAGNOSIS — N186 End stage renal disease: Secondary | ICD-10-CM | POA: Diagnosis not present

## 2016-07-09 DIAGNOSIS — N186 End stage renal disease: Secondary | ICD-10-CM | POA: Diagnosis not present

## 2016-07-09 DIAGNOSIS — E1129 Type 2 diabetes mellitus with other diabetic kidney complication: Secondary | ICD-10-CM | POA: Diagnosis not present

## 2016-07-09 DIAGNOSIS — N2581 Secondary hyperparathyroidism of renal origin: Secondary | ICD-10-CM | POA: Diagnosis not present

## 2016-07-11 DIAGNOSIS — N186 End stage renal disease: Secondary | ICD-10-CM | POA: Diagnosis not present

## 2016-07-11 DIAGNOSIS — N2581 Secondary hyperparathyroidism of renal origin: Secondary | ICD-10-CM | POA: Diagnosis not present

## 2016-07-11 DIAGNOSIS — E1129 Type 2 diabetes mellitus with other diabetic kidney complication: Secondary | ICD-10-CM | POA: Diagnosis not present

## 2016-07-14 DIAGNOSIS — N186 End stage renal disease: Secondary | ICD-10-CM | POA: Diagnosis not present

## 2016-07-14 DIAGNOSIS — E1129 Type 2 diabetes mellitus with other diabetic kidney complication: Secondary | ICD-10-CM | POA: Diagnosis not present

## 2016-07-14 DIAGNOSIS — N2581 Secondary hyperparathyroidism of renal origin: Secondary | ICD-10-CM | POA: Diagnosis not present

## 2016-07-16 DIAGNOSIS — N2581 Secondary hyperparathyroidism of renal origin: Secondary | ICD-10-CM | POA: Diagnosis not present

## 2016-07-16 DIAGNOSIS — N186 End stage renal disease: Secondary | ICD-10-CM | POA: Diagnosis not present

## 2016-07-16 DIAGNOSIS — E1129 Type 2 diabetes mellitus with other diabetic kidney complication: Secondary | ICD-10-CM | POA: Diagnosis not present

## 2016-07-18 DIAGNOSIS — E1129 Type 2 diabetes mellitus with other diabetic kidney complication: Secondary | ICD-10-CM | POA: Diagnosis not present

## 2016-07-18 DIAGNOSIS — N2581 Secondary hyperparathyroidism of renal origin: Secondary | ICD-10-CM | POA: Diagnosis not present

## 2016-07-18 DIAGNOSIS — N186 End stage renal disease: Secondary | ICD-10-CM | POA: Diagnosis not present

## 2016-07-21 DIAGNOSIS — N186 End stage renal disease: Secondary | ICD-10-CM | POA: Diagnosis not present

## 2016-07-21 DIAGNOSIS — N2581 Secondary hyperparathyroidism of renal origin: Secondary | ICD-10-CM | POA: Diagnosis not present

## 2016-07-21 DIAGNOSIS — E1129 Type 2 diabetes mellitus with other diabetic kidney complication: Secondary | ICD-10-CM | POA: Diagnosis not present

## 2016-07-23 DIAGNOSIS — N2581 Secondary hyperparathyroidism of renal origin: Secondary | ICD-10-CM | POA: Diagnosis not present

## 2016-07-23 DIAGNOSIS — E1129 Type 2 diabetes mellitus with other diabetic kidney complication: Secondary | ICD-10-CM | POA: Diagnosis not present

## 2016-07-23 DIAGNOSIS — N186 End stage renal disease: Secondary | ICD-10-CM | POA: Diagnosis not present

## 2016-07-25 DIAGNOSIS — N186 End stage renal disease: Secondary | ICD-10-CM | POA: Diagnosis not present

## 2016-07-25 DIAGNOSIS — N2581 Secondary hyperparathyroidism of renal origin: Secondary | ICD-10-CM | POA: Diagnosis not present

## 2016-07-25 DIAGNOSIS — E1129 Type 2 diabetes mellitus with other diabetic kidney complication: Secondary | ICD-10-CM | POA: Diagnosis not present

## 2016-07-27 DIAGNOSIS — N2581 Secondary hyperparathyroidism of renal origin: Secondary | ICD-10-CM | POA: Diagnosis not present

## 2016-07-27 DIAGNOSIS — E1129 Type 2 diabetes mellitus with other diabetic kidney complication: Secondary | ICD-10-CM | POA: Diagnosis not present

## 2016-07-27 DIAGNOSIS — N186 End stage renal disease: Secondary | ICD-10-CM | POA: Diagnosis not present

## 2016-07-30 DIAGNOSIS — N2581 Secondary hyperparathyroidism of renal origin: Secondary | ICD-10-CM | POA: Diagnosis not present

## 2016-07-30 DIAGNOSIS — E1129 Type 2 diabetes mellitus with other diabetic kidney complication: Secondary | ICD-10-CM | POA: Diagnosis not present

## 2016-07-30 DIAGNOSIS — N186 End stage renal disease: Secondary | ICD-10-CM | POA: Diagnosis not present

## 2016-08-01 DIAGNOSIS — E1129 Type 2 diabetes mellitus with other diabetic kidney complication: Secondary | ICD-10-CM | POA: Diagnosis not present

## 2016-08-01 DIAGNOSIS — N2581 Secondary hyperparathyroidism of renal origin: Secondary | ICD-10-CM | POA: Diagnosis not present

## 2016-08-01 DIAGNOSIS — N186 End stage renal disease: Secondary | ICD-10-CM | POA: Diagnosis not present

## 2016-08-03 DIAGNOSIS — N186 End stage renal disease: Secondary | ICD-10-CM | POA: Diagnosis not present

## 2016-08-03 DIAGNOSIS — E1129 Type 2 diabetes mellitus with other diabetic kidney complication: Secondary | ICD-10-CM | POA: Diagnosis not present

## 2016-08-03 DIAGNOSIS — I129 Hypertensive chronic kidney disease with stage 1 through stage 4 chronic kidney disease, or unspecified chronic kidney disease: Secondary | ICD-10-CM | POA: Diagnosis not present

## 2016-08-03 DIAGNOSIS — N2581 Secondary hyperparathyroidism of renal origin: Secondary | ICD-10-CM | POA: Diagnosis not present

## 2016-08-03 DIAGNOSIS — Z992 Dependence on renal dialysis: Secondary | ICD-10-CM | POA: Diagnosis not present

## 2016-08-05 ENCOUNTER — Ambulatory Visit (INDEPENDENT_AMBULATORY_CARE_PROVIDER_SITE_OTHER): Payer: Medicare Other | Admitting: Physician Assistant

## 2016-08-05 ENCOUNTER — Encounter: Payer: Self-pay | Admitting: Physician Assistant

## 2016-08-05 VITALS — BP 167/100 | HR 79 | Temp 98.5°F | Resp 16 | Ht 67.0 in | Wt 180.0 lb

## 2016-08-05 DIAGNOSIS — N186 End stage renal disease: Secondary | ICD-10-CM

## 2016-08-05 NOTE — Progress Notes (Signed)
History of Present Illness:  Patient is a 51 y.o. year old female who presents for evaluation of her left UE av fistula.  The fistula was created in 01/2015 and has had good success.  She has a distal area of concern with superficial scab about 1mm, and thinning skin on the proximal stick site.  She was seen by Dr. Augustin Coupe for angioplasty of her fistula a month ago and he referred her for exam.  Other wise she has no concerns. Past medical history includes: HTN managed with Norvasc and Atenolol.  She does take an 81 mg aspirin daily.  She denise DM and CAD.   Past Medical History:  Diagnosis Date  . Anemia of chronic disease   . Arthritis   . Deceased-donor kidney transplant    Performed at [facility], April 2010.  Initial ESRD due to HTN nephropathy  . Eczema   . ESRD (end stage renal disease) ([place])    s/p transplant creatinine baseline 1.1  M/W/F dialysis  . FUO (fever of unknown origin) 05/17/2015  . GERD (gastroesophageal reflux disease)   . Headache(784.0)   . History of hyperparathyroidism   . Hypertension   . Shortness of breath   . Wears glasses     Past Surgical History:  Procedure Laterality Date  . AV FISTULA PLACEMENT    . BASCILIC VEIN TRANSPOSITION Left 10/30/2014   Procedure: LEFT BASCILIC VEIN TRANSPOSITION;  Surgeon: Rosetta Posner, MD;  Location: [place];  Service: Vascular;  Laterality: Left;  . BASCILIC VEIN TRANSPOSITION Left 01/03/2015   Procedure: LEFT ARM 2ND STAGE BASCILIC VEIN TRANSPOSITION;  Surgeon: Rosetta Posner, MD;  Location: [place];  Service: Vascular;  Laterality: Left;  . FRACTURE SURGERY     left foot,baby toe nad next toe missing  . INSERTION OF DIALYSIS CATHETER Right 10/30/2014   Procedure: INSERTION OF DIALYSIS CATHETER;  Surgeon: Rosetta Posner, MD;  Location: [place];  Service: Vascular;  Laterality: Right;  . KIDNEY TRANSPLANT  11/2008   [facility]  . WISDOM TOOTH EXTRACTION       Social History Social History  Substance Use Topics  .  Smoking status: Current Every Day Smoker    Packs/day: 0.50    Years: 27.00    Types: Cigarettes  . Smokeless tobacco: Never Used     Comment: 10 cigarettes a day  . Alcohol use No     Comment: occasional drinker noted in the past    Family History Family History  Problem Relation Age of Onset  . Hypertension Mother   . Hypertension Father   . Diabetes Brother   . Deep vein thrombosis Brother   . Hypertension Sister   . Hyperlipidemia Sister   . Kidney disease Brother     on HD    Allergies  Allergies  Allergen Reactions  . Penicillin G Itching and Rash  . Penicillins Itching and Rash     Current Outpatient Prescriptions  Medication Sig Dispense Refill  . amLODipine (NORVASC) 10 MG tablet Take 10 mg by mouth daily.     Marland Kitchen aspirin 81 MG tablet Take 81 mg by mouth daily.     Marland Kitchen atenolol (TENORMIN) 50 MG tablet Take 50 mg by mouth 2 (two) times daily.     . calcitRIOL (ROCALTROL) 0.5 MCG capsule Take 1 capsule (0.5 mcg total) by mouth every Monday, Wednesday, and Friday with hemodialysis.    Marland Kitchen calcium acetate (PHOSLO) 667 MG capsule Take 1,334-3,335 mg  by mouth 3 (three) times daily with meals. Tales 5 caps with each meal, 2 caps with snacks    . diphenhydrAMINE (BENADRYL) 25 MG tablet Take 25 mg by mouth every 8 (eight) hours as needed (for cough).    . norethindrone (AYGESTIN) 5 MG tablet Take 5 mg by mouth 2 (two) times daily.   11   No current facility-administered medications for this visit.     ROS:   General:  No weight loss, Fever, chills  HEENT: No recent headaches, no nasal bleeding, no visual changes, no sore throat  Neurologic: No dizziness, blackouts, seizures. No recent symptoms of stroke or mini- stroke. No recent episodes of slurred speech, or temporary blindness.  Cardiac: No recent episodes of chest pain/pressure, no shortness of breath at rest.  No shortness of breath with exertion.  Denies history of atrial fibrillation or irregular  heartbeat  Vascular: No history of rest pain in feet.  No history of claudication.  No history of non-healing ulcer, No history of DVT   Pulmonary: No home oxygen, no productive cough, no hemoptysis,  No asthma or wheezing  Musculoskeletal:  [ ]  Arthritis, [ ]  Low back pain,  [ ]  Joint pain  Hematologic:No history of hypercoagulable state.  No history of easy bleeding.  No history of anemia  Gastrointestinal: No hematochezia or melena,  No gastroesophageal reflux, no trouble swallowing  Urinary: [x ] chronic Kidney disease, [ ]  on HD - [ ]  MWF or [x ] TTHS, [ ]  Burning with urination, [ ]  Frequent urination, [ ]  Difficulty urinating;   Skin: No rashes  Psychological: No history of anxiety,  No history of depression   Physical Examination  Vitals:   08/05/16 1405 08/05/16 1411  BP: (!) 179/107 (!) 167/100  Pulse: 79   Resp: 16   Temp: 98.5 F (36.9 C)   TempSrc: Oral   SpO2: 99%   Weight: 180 lb (81.6 kg)   Height: 5\' 7"  (1.702 m)     Body mass index is 28.19 kg/m.  General:  Alert and oriented, no acute distress HEENT: Normal Neck: No bruit or JVD Pulmonary: Clear to auscultation bilaterally Cardiac: Regular Rate and Rhythm without murmur Gastrointestinal: Soft, non-tender, non-distended, no mass, no scars Skin: No rash Extremity Pulses:  2+ radial, brachial pulses bilaterally, palpable thrill left UE BB fistula.  Thinning distal area with superficial scab.  The skin is very mobile over the fistula and does not appear to has a penetrating ulcer to the fistula itself.  Thinning skin over proximal site without scab and mobile. Musculoskeletal: No deformity or edema  Neurologic: Upper and lower extremity motor 5/5 and symmetric    ASSESSMENT:  Healthy well working fistula with thinning areas of skin, no penetrating ulcers or risk of bleeding.   PLAN: Thinning distal area with superficial scab.  The skin is very mobile over the fistula and does not appear to has a  penetrating ulcer to the fistula itself.  Thinning skin over proximal site without scab and mobile.    Do not stick at these sites, she has plenty of area above the thinning skin to stick for access.  This is a well working fistula in no immediate danger of bleeding.    F/U PRN  Calyn Sivils MAUREEN PA-C Vascular and Vein Specialists of [facility]

## 2016-08-06 DIAGNOSIS — E1129 Type 2 diabetes mellitus with other diabetic kidney complication: Secondary | ICD-10-CM | POA: Diagnosis not present

## 2016-08-06 DIAGNOSIS — N2581 Secondary hyperparathyroidism of renal origin: Secondary | ICD-10-CM | POA: Diagnosis not present

## 2016-08-06 DIAGNOSIS — N186 End stage renal disease: Secondary | ICD-10-CM | POA: Diagnosis not present

## 2016-08-06 DIAGNOSIS — Z23 Encounter for immunization: Secondary | ICD-10-CM | POA: Diagnosis not present

## 2016-08-06 DIAGNOSIS — D509 Iron deficiency anemia, unspecified: Secondary | ICD-10-CM | POA: Diagnosis not present

## 2016-08-08 DIAGNOSIS — E1129 Type 2 diabetes mellitus with other diabetic kidney complication: Secondary | ICD-10-CM | POA: Diagnosis not present

## 2016-08-08 DIAGNOSIS — Z23 Encounter for immunization: Secondary | ICD-10-CM | POA: Diagnosis not present

## 2016-08-08 DIAGNOSIS — N186 End stage renal disease: Secondary | ICD-10-CM | POA: Diagnosis not present

## 2016-08-08 DIAGNOSIS — N2581 Secondary hyperparathyroidism of renal origin: Secondary | ICD-10-CM | POA: Diagnosis not present

## 2016-08-08 DIAGNOSIS — D509 Iron deficiency anemia, unspecified: Secondary | ICD-10-CM | POA: Diagnosis not present

## 2016-08-11 DIAGNOSIS — D509 Iron deficiency anemia, unspecified: Secondary | ICD-10-CM | POA: Diagnosis not present

## 2016-08-11 DIAGNOSIS — E1129 Type 2 diabetes mellitus with other diabetic kidney complication: Secondary | ICD-10-CM | POA: Diagnosis not present

## 2016-08-11 DIAGNOSIS — Z23 Encounter for immunization: Secondary | ICD-10-CM | POA: Diagnosis not present

## 2016-08-11 DIAGNOSIS — N2581 Secondary hyperparathyroidism of renal origin: Secondary | ICD-10-CM | POA: Diagnosis not present

## 2016-08-11 DIAGNOSIS — N186 End stage renal disease: Secondary | ICD-10-CM | POA: Diagnosis not present

## 2016-08-13 DIAGNOSIS — E1129 Type 2 diabetes mellitus with other diabetic kidney complication: Secondary | ICD-10-CM | POA: Diagnosis not present

## 2016-08-13 DIAGNOSIS — N186 End stage renal disease: Secondary | ICD-10-CM | POA: Diagnosis not present

## 2016-08-13 DIAGNOSIS — D509 Iron deficiency anemia, unspecified: Secondary | ICD-10-CM | POA: Diagnosis not present

## 2016-08-13 DIAGNOSIS — Z23 Encounter for immunization: Secondary | ICD-10-CM | POA: Diagnosis not present

## 2016-08-13 DIAGNOSIS — N2581 Secondary hyperparathyroidism of renal origin: Secondary | ICD-10-CM | POA: Diagnosis not present

## 2016-08-15 DIAGNOSIS — E1129 Type 2 diabetes mellitus with other diabetic kidney complication: Secondary | ICD-10-CM | POA: Diagnosis not present

## 2016-08-15 DIAGNOSIS — N186 End stage renal disease: Secondary | ICD-10-CM | POA: Diagnosis not present

## 2016-08-15 DIAGNOSIS — D509 Iron deficiency anemia, unspecified: Secondary | ICD-10-CM | POA: Diagnosis not present

## 2016-08-15 DIAGNOSIS — N2581 Secondary hyperparathyroidism of renal origin: Secondary | ICD-10-CM | POA: Diagnosis not present

## 2016-08-15 DIAGNOSIS — Z23 Encounter for immunization: Secondary | ICD-10-CM | POA: Diagnosis not present

## 2016-08-18 DIAGNOSIS — E1129 Type 2 diabetes mellitus with other diabetic kidney complication: Secondary | ICD-10-CM | POA: Diagnosis not present

## 2016-08-18 DIAGNOSIS — Z23 Encounter for immunization: Secondary | ICD-10-CM | POA: Diagnosis not present

## 2016-08-18 DIAGNOSIS — N2581 Secondary hyperparathyroidism of renal origin: Secondary | ICD-10-CM | POA: Diagnosis not present

## 2016-08-18 DIAGNOSIS — D509 Iron deficiency anemia, unspecified: Secondary | ICD-10-CM | POA: Diagnosis not present

## 2016-08-18 DIAGNOSIS — N186 End stage renal disease: Secondary | ICD-10-CM | POA: Diagnosis not present

## 2016-08-20 DIAGNOSIS — N2581 Secondary hyperparathyroidism of renal origin: Secondary | ICD-10-CM | POA: Diagnosis not present

## 2016-08-20 DIAGNOSIS — E1129 Type 2 diabetes mellitus with other diabetic kidney complication: Secondary | ICD-10-CM | POA: Diagnosis not present

## 2016-08-20 DIAGNOSIS — N186 End stage renal disease: Secondary | ICD-10-CM | POA: Diagnosis not present

## 2016-08-20 DIAGNOSIS — Z23 Encounter for immunization: Secondary | ICD-10-CM | POA: Diagnosis not present

## 2016-08-20 DIAGNOSIS — D509 Iron deficiency anemia, unspecified: Secondary | ICD-10-CM | POA: Diagnosis not present

## 2016-08-22 DIAGNOSIS — E1129 Type 2 diabetes mellitus with other diabetic kidney complication: Secondary | ICD-10-CM | POA: Diagnosis not present

## 2016-08-22 DIAGNOSIS — N2581 Secondary hyperparathyroidism of renal origin: Secondary | ICD-10-CM | POA: Diagnosis not present

## 2016-08-22 DIAGNOSIS — N186 End stage renal disease: Secondary | ICD-10-CM | POA: Diagnosis not present

## 2016-08-22 DIAGNOSIS — D509 Iron deficiency anemia, unspecified: Secondary | ICD-10-CM | POA: Diagnosis not present

## 2016-08-22 DIAGNOSIS — Z23 Encounter for immunization: Secondary | ICD-10-CM | POA: Diagnosis not present

## 2016-08-25 DIAGNOSIS — Z23 Encounter for immunization: Secondary | ICD-10-CM | POA: Diagnosis not present

## 2016-08-25 DIAGNOSIS — N186 End stage renal disease: Secondary | ICD-10-CM | POA: Diagnosis not present

## 2016-08-25 DIAGNOSIS — E1129 Type 2 diabetes mellitus with other diabetic kidney complication: Secondary | ICD-10-CM | POA: Diagnosis not present

## 2016-08-25 DIAGNOSIS — D509 Iron deficiency anemia, unspecified: Secondary | ICD-10-CM | POA: Diagnosis not present

## 2016-08-25 DIAGNOSIS — N2581 Secondary hyperparathyroidism of renal origin: Secondary | ICD-10-CM | POA: Diagnosis not present

## 2016-08-27 DIAGNOSIS — N2581 Secondary hyperparathyroidism of renal origin: Secondary | ICD-10-CM | POA: Diagnosis not present

## 2016-08-27 DIAGNOSIS — Z23 Encounter for immunization: Secondary | ICD-10-CM | POA: Diagnosis not present

## 2016-08-27 DIAGNOSIS — E1129 Type 2 diabetes mellitus with other diabetic kidney complication: Secondary | ICD-10-CM | POA: Diagnosis not present

## 2016-08-27 DIAGNOSIS — D509 Iron deficiency anemia, unspecified: Secondary | ICD-10-CM | POA: Diagnosis not present

## 2016-08-27 DIAGNOSIS — N186 End stage renal disease: Secondary | ICD-10-CM | POA: Diagnosis not present

## 2016-08-29 DIAGNOSIS — N186 End stage renal disease: Secondary | ICD-10-CM | POA: Diagnosis not present

## 2016-08-29 DIAGNOSIS — N2581 Secondary hyperparathyroidism of renal origin: Secondary | ICD-10-CM | POA: Diagnosis not present

## 2016-08-29 DIAGNOSIS — E1129 Type 2 diabetes mellitus with other diabetic kidney complication: Secondary | ICD-10-CM | POA: Diagnosis not present

## 2016-08-29 DIAGNOSIS — Z23 Encounter for immunization: Secondary | ICD-10-CM | POA: Diagnosis not present

## 2016-08-29 DIAGNOSIS — D509 Iron deficiency anemia, unspecified: Secondary | ICD-10-CM | POA: Diagnosis not present

## 2016-09-01 DIAGNOSIS — D509 Iron deficiency anemia, unspecified: Secondary | ICD-10-CM | POA: Diagnosis not present

## 2016-09-01 DIAGNOSIS — N2581 Secondary hyperparathyroidism of renal origin: Secondary | ICD-10-CM | POA: Diagnosis not present

## 2016-09-01 DIAGNOSIS — Z23 Encounter for immunization: Secondary | ICD-10-CM | POA: Diagnosis not present

## 2016-09-01 DIAGNOSIS — N186 End stage renal disease: Secondary | ICD-10-CM | POA: Diagnosis not present

## 2016-09-01 DIAGNOSIS — E1129 Type 2 diabetes mellitus with other diabetic kidney complication: Secondary | ICD-10-CM | POA: Diagnosis not present

## 2016-09-03 DIAGNOSIS — D509 Iron deficiency anemia, unspecified: Secondary | ICD-10-CM | POA: Diagnosis not present

## 2016-09-03 DIAGNOSIS — E1129 Type 2 diabetes mellitus with other diabetic kidney complication: Secondary | ICD-10-CM | POA: Diagnosis not present

## 2016-09-03 DIAGNOSIS — N186 End stage renal disease: Secondary | ICD-10-CM | POA: Diagnosis not present

## 2016-09-03 DIAGNOSIS — N2581 Secondary hyperparathyroidism of renal origin: Secondary | ICD-10-CM | POA: Diagnosis not present

## 2016-09-03 DIAGNOSIS — I129 Hypertensive chronic kidney disease with stage 1 through stage 4 chronic kidney disease, or unspecified chronic kidney disease: Secondary | ICD-10-CM | POA: Diagnosis not present

## 2016-09-03 DIAGNOSIS — Z23 Encounter for immunization: Secondary | ICD-10-CM | POA: Diagnosis not present

## 2016-09-03 DIAGNOSIS — Z992 Dependence on renal dialysis: Secondary | ICD-10-CM | POA: Diagnosis not present

## 2016-09-05 DIAGNOSIS — E1129 Type 2 diabetes mellitus with other diabetic kidney complication: Secondary | ICD-10-CM | POA: Diagnosis not present

## 2016-09-05 DIAGNOSIS — N2581 Secondary hyperparathyroidism of renal origin: Secondary | ICD-10-CM | POA: Diagnosis not present

## 2016-09-05 DIAGNOSIS — D509 Iron deficiency anemia, unspecified: Secondary | ICD-10-CM | POA: Diagnosis not present

## 2016-09-05 DIAGNOSIS — N186 End stage renal disease: Secondary | ICD-10-CM | POA: Diagnosis not present

## 2016-09-08 DIAGNOSIS — N2581 Secondary hyperparathyroidism of renal origin: Secondary | ICD-10-CM | POA: Diagnosis not present

## 2016-09-08 DIAGNOSIS — D509 Iron deficiency anemia, unspecified: Secondary | ICD-10-CM | POA: Diagnosis not present

## 2016-09-08 DIAGNOSIS — E1129 Type 2 diabetes mellitus with other diabetic kidney complication: Secondary | ICD-10-CM | POA: Diagnosis not present

## 2016-09-08 DIAGNOSIS — N186 End stage renal disease: Secondary | ICD-10-CM | POA: Diagnosis not present

## 2016-09-10 DIAGNOSIS — D509 Iron deficiency anemia, unspecified: Secondary | ICD-10-CM | POA: Diagnosis not present

## 2016-09-10 DIAGNOSIS — N2581 Secondary hyperparathyroidism of renal origin: Secondary | ICD-10-CM | POA: Diagnosis not present

## 2016-09-10 DIAGNOSIS — N186 End stage renal disease: Secondary | ICD-10-CM | POA: Diagnosis not present

## 2016-09-10 DIAGNOSIS — E1129 Type 2 diabetes mellitus with other diabetic kidney complication: Secondary | ICD-10-CM | POA: Diagnosis not present

## 2016-09-12 DIAGNOSIS — N2581 Secondary hyperparathyroidism of renal origin: Secondary | ICD-10-CM | POA: Diagnosis not present

## 2016-09-12 DIAGNOSIS — N186 End stage renal disease: Secondary | ICD-10-CM | POA: Diagnosis not present

## 2016-09-12 DIAGNOSIS — E1129 Type 2 diabetes mellitus with other diabetic kidney complication: Secondary | ICD-10-CM | POA: Diagnosis not present

## 2016-09-12 DIAGNOSIS — D509 Iron deficiency anemia, unspecified: Secondary | ICD-10-CM | POA: Diagnosis not present

## 2016-09-15 DIAGNOSIS — E1129 Type 2 diabetes mellitus with other diabetic kidney complication: Secondary | ICD-10-CM | POA: Diagnosis not present

## 2016-09-15 DIAGNOSIS — N2581 Secondary hyperparathyroidism of renal origin: Secondary | ICD-10-CM | POA: Diagnosis not present

## 2016-09-15 DIAGNOSIS — N186 End stage renal disease: Secondary | ICD-10-CM | POA: Diagnosis not present

## 2016-09-15 DIAGNOSIS — D509 Iron deficiency anemia, unspecified: Secondary | ICD-10-CM | POA: Diagnosis not present

## 2016-09-17 DIAGNOSIS — E1129 Type 2 diabetes mellitus with other diabetic kidney complication: Secondary | ICD-10-CM | POA: Diagnosis not present

## 2016-09-17 DIAGNOSIS — D509 Iron deficiency anemia, unspecified: Secondary | ICD-10-CM | POA: Diagnosis not present

## 2016-09-17 DIAGNOSIS — N2581 Secondary hyperparathyroidism of renal origin: Secondary | ICD-10-CM | POA: Diagnosis not present

## 2016-09-17 DIAGNOSIS — N186 End stage renal disease: Secondary | ICD-10-CM | POA: Diagnosis not present

## 2016-09-19 DIAGNOSIS — D509 Iron deficiency anemia, unspecified: Secondary | ICD-10-CM | POA: Diagnosis not present

## 2016-09-19 DIAGNOSIS — E1129 Type 2 diabetes mellitus with other diabetic kidney complication: Secondary | ICD-10-CM | POA: Diagnosis not present

## 2016-09-19 DIAGNOSIS — N2581 Secondary hyperparathyroidism of renal origin: Secondary | ICD-10-CM | POA: Diagnosis not present

## 2016-09-19 DIAGNOSIS — N186 End stage renal disease: Secondary | ICD-10-CM | POA: Diagnosis not present

## 2016-09-22 DIAGNOSIS — N2581 Secondary hyperparathyroidism of renal origin: Secondary | ICD-10-CM | POA: Diagnosis not present

## 2016-09-22 DIAGNOSIS — N186 End stage renal disease: Secondary | ICD-10-CM | POA: Diagnosis not present

## 2016-09-22 DIAGNOSIS — D509 Iron deficiency anemia, unspecified: Secondary | ICD-10-CM | POA: Diagnosis not present

## 2016-09-22 DIAGNOSIS — E1129 Type 2 diabetes mellitus with other diabetic kidney complication: Secondary | ICD-10-CM | POA: Diagnosis not present

## 2016-09-24 DIAGNOSIS — E1129 Type 2 diabetes mellitus with other diabetic kidney complication: Secondary | ICD-10-CM | POA: Diagnosis not present

## 2016-09-24 DIAGNOSIS — N2581 Secondary hyperparathyroidism of renal origin: Secondary | ICD-10-CM | POA: Diagnosis not present

## 2016-09-24 DIAGNOSIS — N186 End stage renal disease: Secondary | ICD-10-CM | POA: Diagnosis not present

## 2016-09-24 DIAGNOSIS — D509 Iron deficiency anemia, unspecified: Secondary | ICD-10-CM | POA: Diagnosis not present

## 2016-09-26 DIAGNOSIS — N2581 Secondary hyperparathyroidism of renal origin: Secondary | ICD-10-CM | POA: Diagnosis not present

## 2016-09-26 DIAGNOSIS — E1129 Type 2 diabetes mellitus with other diabetic kidney complication: Secondary | ICD-10-CM | POA: Diagnosis not present

## 2016-09-26 DIAGNOSIS — N186 End stage renal disease: Secondary | ICD-10-CM | POA: Diagnosis not present

## 2016-09-26 DIAGNOSIS — D509 Iron deficiency anemia, unspecified: Secondary | ICD-10-CM | POA: Diagnosis not present

## 2016-09-29 DIAGNOSIS — E1129 Type 2 diabetes mellitus with other diabetic kidney complication: Secondary | ICD-10-CM | POA: Diagnosis not present

## 2016-09-29 DIAGNOSIS — N2581 Secondary hyperparathyroidism of renal origin: Secondary | ICD-10-CM | POA: Diagnosis not present

## 2016-09-29 DIAGNOSIS — D509 Iron deficiency anemia, unspecified: Secondary | ICD-10-CM | POA: Diagnosis not present

## 2016-09-29 DIAGNOSIS — N186 End stage renal disease: Secondary | ICD-10-CM | POA: Diagnosis not present

## 2016-10-01 DIAGNOSIS — Z992 Dependence on renal dialysis: Secondary | ICD-10-CM | POA: Diagnosis not present

## 2016-10-01 DIAGNOSIS — N186 End stage renal disease: Secondary | ICD-10-CM | POA: Diagnosis not present

## 2016-10-01 DIAGNOSIS — E1129 Type 2 diabetes mellitus with other diabetic kidney complication: Secondary | ICD-10-CM | POA: Diagnosis not present

## 2016-10-01 DIAGNOSIS — D509 Iron deficiency anemia, unspecified: Secondary | ICD-10-CM | POA: Diagnosis not present

## 2016-10-01 DIAGNOSIS — I129 Hypertensive chronic kidney disease with stage 1 through stage 4 chronic kidney disease, or unspecified chronic kidney disease: Secondary | ICD-10-CM | POA: Diagnosis not present

## 2016-10-01 DIAGNOSIS — N2581 Secondary hyperparathyroidism of renal origin: Secondary | ICD-10-CM | POA: Diagnosis not present

## 2016-10-03 DIAGNOSIS — D631 Anemia in chronic kidney disease: Secondary | ICD-10-CM | POA: Diagnosis not present

## 2016-10-03 DIAGNOSIS — N2581 Secondary hyperparathyroidism of renal origin: Secondary | ICD-10-CM | POA: Diagnosis not present

## 2016-10-03 DIAGNOSIS — N186 End stage renal disease: Secondary | ICD-10-CM | POA: Diagnosis not present

## 2016-10-03 DIAGNOSIS — D509 Iron deficiency anemia, unspecified: Secondary | ICD-10-CM | POA: Diagnosis not present

## 2016-10-03 DIAGNOSIS — E1129 Type 2 diabetes mellitus with other diabetic kidney complication: Secondary | ICD-10-CM | POA: Diagnosis not present

## 2016-10-06 DIAGNOSIS — D631 Anemia in chronic kidney disease: Secondary | ICD-10-CM | POA: Diagnosis not present

## 2016-10-06 DIAGNOSIS — N2581 Secondary hyperparathyroidism of renal origin: Secondary | ICD-10-CM | POA: Diagnosis not present

## 2016-10-06 DIAGNOSIS — N186 End stage renal disease: Secondary | ICD-10-CM | POA: Diagnosis not present

## 2016-10-06 DIAGNOSIS — E1129 Type 2 diabetes mellitus with other diabetic kidney complication: Secondary | ICD-10-CM | POA: Diagnosis not present

## 2016-10-06 DIAGNOSIS — D509 Iron deficiency anemia, unspecified: Secondary | ICD-10-CM | POA: Diagnosis not present

## 2016-10-08 DIAGNOSIS — N2581 Secondary hyperparathyroidism of renal origin: Secondary | ICD-10-CM | POA: Diagnosis not present

## 2016-10-08 DIAGNOSIS — E1129 Type 2 diabetes mellitus with other diabetic kidney complication: Secondary | ICD-10-CM | POA: Diagnosis not present

## 2016-10-08 DIAGNOSIS — D631 Anemia in chronic kidney disease: Secondary | ICD-10-CM | POA: Diagnosis not present

## 2016-10-08 DIAGNOSIS — D509 Iron deficiency anemia, unspecified: Secondary | ICD-10-CM | POA: Diagnosis not present

## 2016-10-08 DIAGNOSIS — N186 End stage renal disease: Secondary | ICD-10-CM | POA: Diagnosis not present

## 2016-10-09 DIAGNOSIS — Z124 Encounter for screening for malignant neoplasm of cervix: Secondary | ICD-10-CM | POA: Diagnosis not present

## 2016-10-09 DIAGNOSIS — N939 Abnormal uterine and vaginal bleeding, unspecified: Secondary | ICD-10-CM | POA: Diagnosis not present

## 2016-10-09 DIAGNOSIS — N950 Postmenopausal bleeding: Secondary | ICD-10-CM | POA: Diagnosis not present

## 2016-10-10 DIAGNOSIS — N2581 Secondary hyperparathyroidism of renal origin: Secondary | ICD-10-CM | POA: Diagnosis not present

## 2016-10-10 DIAGNOSIS — D631 Anemia in chronic kidney disease: Secondary | ICD-10-CM | POA: Diagnosis not present

## 2016-10-10 DIAGNOSIS — N186 End stage renal disease: Secondary | ICD-10-CM | POA: Diagnosis not present

## 2016-10-10 DIAGNOSIS — D509 Iron deficiency anemia, unspecified: Secondary | ICD-10-CM | POA: Diagnosis not present

## 2016-10-10 DIAGNOSIS — E1129 Type 2 diabetes mellitus with other diabetic kidney complication: Secondary | ICD-10-CM | POA: Diagnosis not present

## 2016-10-13 DIAGNOSIS — D631 Anemia in chronic kidney disease: Secondary | ICD-10-CM | POA: Diagnosis not present

## 2016-10-13 DIAGNOSIS — N2581 Secondary hyperparathyroidism of renal origin: Secondary | ICD-10-CM | POA: Diagnosis not present

## 2016-10-13 DIAGNOSIS — N186 End stage renal disease: Secondary | ICD-10-CM | POA: Diagnosis not present

## 2016-10-13 DIAGNOSIS — D509 Iron deficiency anemia, unspecified: Secondary | ICD-10-CM | POA: Diagnosis not present

## 2016-10-13 DIAGNOSIS — E1129 Type 2 diabetes mellitus with other diabetic kidney complication: Secondary | ICD-10-CM | POA: Diagnosis not present

## 2016-10-15 DIAGNOSIS — D631 Anemia in chronic kidney disease: Secondary | ICD-10-CM | POA: Diagnosis not present

## 2016-10-15 DIAGNOSIS — D509 Iron deficiency anemia, unspecified: Secondary | ICD-10-CM | POA: Diagnosis not present

## 2016-10-15 DIAGNOSIS — N2581 Secondary hyperparathyroidism of renal origin: Secondary | ICD-10-CM | POA: Diagnosis not present

## 2016-10-15 DIAGNOSIS — N186 End stage renal disease: Secondary | ICD-10-CM | POA: Diagnosis not present

## 2016-10-15 DIAGNOSIS — E1129 Type 2 diabetes mellitus with other diabetic kidney complication: Secondary | ICD-10-CM | POA: Diagnosis not present

## 2016-10-17 DIAGNOSIS — D509 Iron deficiency anemia, unspecified: Secondary | ICD-10-CM | POA: Diagnosis not present

## 2016-10-17 DIAGNOSIS — N2581 Secondary hyperparathyroidism of renal origin: Secondary | ICD-10-CM | POA: Diagnosis not present

## 2016-10-17 DIAGNOSIS — N186 End stage renal disease: Secondary | ICD-10-CM | POA: Diagnosis not present

## 2016-10-17 DIAGNOSIS — D631 Anemia in chronic kidney disease: Secondary | ICD-10-CM | POA: Diagnosis not present

## 2016-10-17 DIAGNOSIS — E1129 Type 2 diabetes mellitus with other diabetic kidney complication: Secondary | ICD-10-CM | POA: Diagnosis not present

## 2016-10-20 DIAGNOSIS — N186 End stage renal disease: Secondary | ICD-10-CM | POA: Diagnosis not present

## 2016-10-20 DIAGNOSIS — E1129 Type 2 diabetes mellitus with other diabetic kidney complication: Secondary | ICD-10-CM | POA: Diagnosis not present

## 2016-10-20 DIAGNOSIS — N2581 Secondary hyperparathyroidism of renal origin: Secondary | ICD-10-CM | POA: Diagnosis not present

## 2016-10-20 DIAGNOSIS — D631 Anemia in chronic kidney disease: Secondary | ICD-10-CM | POA: Diagnosis not present

## 2016-10-20 DIAGNOSIS — D509 Iron deficiency anemia, unspecified: Secondary | ICD-10-CM | POA: Diagnosis not present

## 2016-10-22 DIAGNOSIS — E1129 Type 2 diabetes mellitus with other diabetic kidney complication: Secondary | ICD-10-CM | POA: Diagnosis not present

## 2016-10-22 DIAGNOSIS — N2581 Secondary hyperparathyroidism of renal origin: Secondary | ICD-10-CM | POA: Diagnosis not present

## 2016-10-22 DIAGNOSIS — D509 Iron deficiency anemia, unspecified: Secondary | ICD-10-CM | POA: Diagnosis not present

## 2016-10-22 DIAGNOSIS — D631 Anemia in chronic kidney disease: Secondary | ICD-10-CM | POA: Diagnosis not present

## 2016-10-22 DIAGNOSIS — N186 End stage renal disease: Secondary | ICD-10-CM | POA: Diagnosis not present

## 2016-10-24 DIAGNOSIS — D509 Iron deficiency anemia, unspecified: Secondary | ICD-10-CM | POA: Diagnosis not present

## 2016-10-24 DIAGNOSIS — E1129 Type 2 diabetes mellitus with other diabetic kidney complication: Secondary | ICD-10-CM | POA: Diagnosis not present

## 2016-10-24 DIAGNOSIS — N2581 Secondary hyperparathyroidism of renal origin: Secondary | ICD-10-CM | POA: Diagnosis not present

## 2016-10-24 DIAGNOSIS — N186 End stage renal disease: Secondary | ICD-10-CM | POA: Diagnosis not present

## 2016-10-24 DIAGNOSIS — D631 Anemia in chronic kidney disease: Secondary | ICD-10-CM | POA: Diagnosis not present

## 2016-10-27 DIAGNOSIS — N186 End stage renal disease: Secondary | ICD-10-CM | POA: Diagnosis not present

## 2016-10-27 DIAGNOSIS — N2581 Secondary hyperparathyroidism of renal origin: Secondary | ICD-10-CM | POA: Diagnosis not present

## 2016-10-27 DIAGNOSIS — E1129 Type 2 diabetes mellitus with other diabetic kidney complication: Secondary | ICD-10-CM | POA: Diagnosis not present

## 2016-10-27 DIAGNOSIS — D631 Anemia in chronic kidney disease: Secondary | ICD-10-CM | POA: Diagnosis not present

## 2016-10-27 DIAGNOSIS — D509 Iron deficiency anemia, unspecified: Secondary | ICD-10-CM | POA: Diagnosis not present

## 2016-10-28 DIAGNOSIS — D251 Intramural leiomyoma of uterus: Secondary | ICD-10-CM | POA: Diagnosis not present

## 2016-10-28 DIAGNOSIS — N950 Postmenopausal bleeding: Secondary | ICD-10-CM | POA: Diagnosis not present

## 2016-10-28 DIAGNOSIS — N840 Polyp of corpus uteri: Secondary | ICD-10-CM | POA: Diagnosis not present

## 2016-10-28 DIAGNOSIS — D252 Subserosal leiomyoma of uterus: Secondary | ICD-10-CM | POA: Diagnosis not present

## 2016-10-28 DIAGNOSIS — Z1231 Encounter for screening mammogram for malignant neoplasm of breast: Secondary | ICD-10-CM | POA: Diagnosis not present

## 2016-10-29 DIAGNOSIS — D509 Iron deficiency anemia, unspecified: Secondary | ICD-10-CM | POA: Diagnosis not present

## 2016-10-29 DIAGNOSIS — E1129 Type 2 diabetes mellitus with other diabetic kidney complication: Secondary | ICD-10-CM | POA: Diagnosis not present

## 2016-10-29 DIAGNOSIS — N2581 Secondary hyperparathyroidism of renal origin: Secondary | ICD-10-CM | POA: Diagnosis not present

## 2016-10-29 DIAGNOSIS — N186 End stage renal disease: Secondary | ICD-10-CM | POA: Diagnosis not present

## 2016-10-29 DIAGNOSIS — D631 Anemia in chronic kidney disease: Secondary | ICD-10-CM | POA: Diagnosis not present

## 2016-10-30 ENCOUNTER — Encounter (INDEPENDENT_AMBULATORY_CARE_PROVIDER_SITE_OTHER): Payer: Self-pay | Admitting: Vascular Surgery

## 2016-10-30 ENCOUNTER — Ambulatory Visit (INDEPENDENT_AMBULATORY_CARE_PROVIDER_SITE_OTHER): Payer: Medicare Other | Admitting: Vascular Surgery

## 2016-10-30 VITALS — BP 140/95 | HR 93 | Resp 17 | Ht 67.0 in | Wt 179.0 lb

## 2016-10-30 DIAGNOSIS — Z720 Tobacco use: Secondary | ICD-10-CM | POA: Diagnosis not present

## 2016-10-30 DIAGNOSIS — N186 End stage renal disease: Secondary | ICD-10-CM | POA: Diagnosis not present

## 2016-10-30 DIAGNOSIS — Z992 Dependence on renal dialysis: Secondary | ICD-10-CM | POA: Diagnosis not present

## 2016-10-30 DIAGNOSIS — T829XXA Unspecified complication of cardiac and vascular prosthetic device, implant and graft, initial encounter: Secondary | ICD-10-CM | POA: Diagnosis not present

## 2016-10-31 DIAGNOSIS — N186 End stage renal disease: Secondary | ICD-10-CM | POA: Diagnosis not present

## 2016-10-31 DIAGNOSIS — N2581 Secondary hyperparathyroidism of renal origin: Secondary | ICD-10-CM | POA: Diagnosis not present

## 2016-10-31 DIAGNOSIS — D509 Iron deficiency anemia, unspecified: Secondary | ICD-10-CM | POA: Diagnosis not present

## 2016-10-31 DIAGNOSIS — D631 Anemia in chronic kidney disease: Secondary | ICD-10-CM | POA: Diagnosis not present

## 2016-10-31 DIAGNOSIS — E1129 Type 2 diabetes mellitus with other diabetic kidney complication: Secondary | ICD-10-CM | POA: Diagnosis not present

## 2016-11-01 DIAGNOSIS — N186 End stage renal disease: Secondary | ICD-10-CM | POA: Diagnosis not present

## 2016-11-01 DIAGNOSIS — Z992 Dependence on renal dialysis: Secondary | ICD-10-CM | POA: Diagnosis not present

## 2016-11-01 DIAGNOSIS — I129 Hypertensive chronic kidney disease with stage 1 through stage 4 chronic kidney disease, or unspecified chronic kidney disease: Secondary | ICD-10-CM | POA: Diagnosis not present

## 2016-11-03 DIAGNOSIS — N186 End stage renal disease: Secondary | ICD-10-CM | POA: Diagnosis not present

## 2016-11-03 DIAGNOSIS — D509 Iron deficiency anemia, unspecified: Secondary | ICD-10-CM | POA: Diagnosis not present

## 2016-11-03 DIAGNOSIS — N2581 Secondary hyperparathyroidism of renal origin: Secondary | ICD-10-CM | POA: Diagnosis not present

## 2016-11-03 DIAGNOSIS — E1129 Type 2 diabetes mellitus with other diabetic kidney complication: Secondary | ICD-10-CM | POA: Diagnosis not present

## 2016-11-05 DIAGNOSIS — D509 Iron deficiency anemia, unspecified: Secondary | ICD-10-CM | POA: Diagnosis not present

## 2016-11-05 DIAGNOSIS — N2581 Secondary hyperparathyroidism of renal origin: Secondary | ICD-10-CM | POA: Diagnosis not present

## 2016-11-05 DIAGNOSIS — N186 End stage renal disease: Secondary | ICD-10-CM | POA: Diagnosis not present

## 2016-11-05 DIAGNOSIS — T829XXA Unspecified complication of cardiac and vascular prosthetic device, implant and graft, initial encounter: Secondary | ICD-10-CM | POA: Insufficient documentation

## 2016-11-05 DIAGNOSIS — E1129 Type 2 diabetes mellitus with other diabetic kidney complication: Secondary | ICD-10-CM | POA: Diagnosis not present

## 2016-11-05 NOTE — Progress Notes (Signed)
Subjective:    Patient ID: Ruth Gutierrez, female    DOB: 12/25/1965, 51 y.o.   MRN: [id number] Chief Complaint  Patient presents with  . New Patient (Initial Visit)   Presents as a new patient referred by Dr. Augustin Coupe for a "second opinion". The patient endorses a history of a wound located on her dialysis access site. She states she has had the wound for "over a year". She is dialyzing through the access as dialysis staff is not cannulating near the "wound". She is dialyzing without issue. The wound is not open. The wound does not drain. The patient denies any fever, nausea or vomiting. There is not erythema surround the wound. There is no hand pain.    Review of Systems  Constitutional: Negative.   HENT: Negative.   Eyes: Negative.   Respiratory: Negative.   Cardiovascular: Negative.   Gastrointestinal: Negative.   Endocrine: Negative.   Genitourinary:       ESRD  Musculoskeletal: Negative.   Skin: Positive for wound.  Allergic/Immunologic: Negative.   Neurological: Negative.   Hematological: Negative.   Psychiatric/Behavioral: Negative.       Objective:   Physical Exam  Constitutional: She is oriented to person, place, and time. She appears well-developed and well-nourished. No distress.  HENT:  Head: Normocephalic and atraumatic.  Eyes: Conjunctivae are normal. Pupils are equal, round, and reactive to light.  Neck: Normal range of motion.  Cardiovascular: Normal rate, regular rhythm and normal heart sounds.   Pulses:      Radial pulses are 2+ on the right side, and 2+ on the left side.  Pulmonary/Chest: Effort normal.  Musculoskeletal: Normal range of motion. She exhibits no edema.  Neurological: She is alert and oriented to person, place, and time.  Skin: She is not diaphoretic.     Left Upper Extremity Fistula: skin irritation toward the distal aspect. The skin is closed. There is not drainage. No surrounding erythema or cellulitis. Good bruit and thrill.  Vitals  reviewed.  BP (!) 140/95   Pulse 93   Resp 17   Ht 5\' 7"  (1.702 m)   Wt 179 lb (81.2 kg)   BMI 28.04 kg/m   Past Medical History:  Diagnosis Date  . Anemia of chronic disease   . Arthritis   . Deceased-donor kidney transplant    Performed at [facility], April 2010.  Initial ESRD due to HTN nephropathy  . Eczema   . ESRD (end stage renal disease) ([place])    s/p transplant creatinine baseline 1.1  M/W/F dialysis  . FUO (fever of unknown origin) 05/17/2015  . GERD (gastroesophageal reflux disease)   . Headache(784.0)   . History of hyperparathyroidism   . Hypertension   . Shortness of breath   . Wears glasses    Social History   Social History  . Marital status: Single    Spouse name: N/A  . Number of children: N/A  . Years of education: N/A   Occupational History  . Not on file.   Social History Main Topics  . Smoking status: Current Every Day Smoker    Packs/day: 0.50    Years: 27.00    Types: Cigarettes  . Smokeless tobacco: Never Used     Comment: 10 cigarettes a day  . Alcohol use No     Comment: occasional drinker noted in the past  . Drug use: No  . Sexual activity: Yes    Partners: Male    Birth control/ protection: None  Other Topics Concern  . Not on file   Social History Narrative   Lives with daughter (born 53), sister helps her with medications etc.   Past Surgical History:  Procedure Laterality Date  . AV FISTULA PLACEMENT    . BASCILIC VEIN TRANSPOSITION Left 10/30/2014   Procedure: LEFT BASCILIC VEIN TRANSPOSITION;  Surgeon: Rosetta Posner, MD;  Location: [place];  Service: Vascular;  Laterality: Left;  . BASCILIC VEIN TRANSPOSITION Left 01/03/2015   Procedure: LEFT ARM 2ND STAGE BASCILIC VEIN TRANSPOSITION;  Surgeon: Rosetta Posner, MD;  Location: [place];  Service: Vascular;  Laterality: Left;  . FRACTURE SURGERY     left foot,baby toe nad next toe missing  . INSERTION OF DIALYSIS CATHETER Right 10/30/2014   Procedure: INSERTION OF DIALYSIS  CATHETER;  Surgeon: Rosetta Posner, MD;  Location: [place];  Service: Vascular;  Laterality: Right;  . KIDNEY TRANSPLANT  11/2008   [facility])  . WISDOM TOOTH EXTRACTION     Family History  Problem Relation Age of Onset  . Hypertension Mother   . Hypertension Father   . Diabetes Brother   . Deep vein thrombosis Brother   . Hypertension Sister   . Hyperlipidemia Sister   . Kidney disease Brother     on HD   Allergies  Allergen Reactions  . Penicillin G Itching and Rash  . Penicillins Itching and Rash      Assessment & Plan:  Presents as a new patient referred by Dr. Augustin Coupe for a "second opinion". The patient endorses a history of a wound located on her dialysis access site. She states she has had the wound for "over a year". She is dialyzing through the access as dialysis staff is not cannulating near the "wound". She is dialyzing without issue. The wound is not open. The wound does not drain. The patient denies any fever, nausea or vomiting. There is not erythema surround the wound. There is no hand pain.    1. ESRD on dialysis ([place]) - Stable Dialyzing through fistula without issue.  Strongly recommend not cannulating site or irritation.   2. Tobacco abuse - Stable I have discussed (approximately 5 minutes) with the patient the role of tobacco in the pathogenesis of atherosclerosis and its effect on the progression of the disease, impact on the durability of interventions and its limitations on the formation of collateral pathways. I have recommended absolute tobacco cessation. I have discussed various options available for assistance with tobacco cessation including over the counter methods (Nicotine gum, patch and lozenges). We also discussed prescription options (Chantix, Nicotine Inhaler / Nasal Spray). The patient is not interested in pursuing any prescription tobacco cessation options at this time. The patient voices their understanding.   3. Complication from renal dialysis  device, initial encounter - New Will try silvadene to the are of irritation. Will see patient back in 10 days for a wound check. Strongly recommend not cannulating site or irritation.   Current Outpatient Prescriptions on File Prior to Visit  Medication Sig Dispense Refill  . amLODipine (NORVASC) 10 MG tablet Take 10 mg by mouth daily.     Marland Kitchen aspirin 81 MG tablet Take 81 mg by mouth daily.     Marland Kitchen atenolol (TENORMIN) 50 MG tablet Take 50 mg by mouth 2 (two) times daily.     . calcitRIOL (ROCALTROL) 0.5 MCG capsule Take 1 capsule (0.5 mcg total) by mouth every Monday, Wednesday, and Friday with hemodialysis.    Marland Kitchen calcium acetate (  PHOSLO) 667 MG capsule Take 1,334-3,335 mg by mouth 3 (three) times daily with meals. Tales 5 caps with each meal, 2 caps with snacks    . diphenhydrAMINE (BENADRYL) 25 MG tablet Take 25 mg by mouth every 8 (eight) hours as needed (for cough).    . norethindrone (AYGESTIN) 5 MG tablet Take 5 mg by mouth 2 (two) times daily.   11   No current facility-administered medications on file prior to visit.     There are no Patient Instructions on file for this visit. No Follow-up on file.   KIMBERLY A STEGMAYER, PA-C

## 2016-11-07 DIAGNOSIS — D509 Iron deficiency anemia, unspecified: Secondary | ICD-10-CM | POA: Diagnosis not present

## 2016-11-07 DIAGNOSIS — N186 End stage renal disease: Secondary | ICD-10-CM | POA: Diagnosis not present

## 2016-11-07 DIAGNOSIS — N2581 Secondary hyperparathyroidism of renal origin: Secondary | ICD-10-CM | POA: Diagnosis not present

## 2016-11-07 DIAGNOSIS — E1129 Type 2 diabetes mellitus with other diabetic kidney complication: Secondary | ICD-10-CM | POA: Diagnosis not present

## 2016-11-10 DIAGNOSIS — D509 Iron deficiency anemia, unspecified: Secondary | ICD-10-CM | POA: Diagnosis not present

## 2016-11-10 DIAGNOSIS — E1129 Type 2 diabetes mellitus with other diabetic kidney complication: Secondary | ICD-10-CM | POA: Diagnosis not present

## 2016-11-10 DIAGNOSIS — N186 End stage renal disease: Secondary | ICD-10-CM | POA: Diagnosis not present

## 2016-11-10 DIAGNOSIS — N2581 Secondary hyperparathyroidism of renal origin: Secondary | ICD-10-CM | POA: Diagnosis not present

## 2016-11-11 ENCOUNTER — Ambulatory Visit (INDEPENDENT_AMBULATORY_CARE_PROVIDER_SITE_OTHER): Payer: Medicare Other | Admitting: Vascular Surgery

## 2016-11-11 ENCOUNTER — Encounter (INDEPENDENT_AMBULATORY_CARE_PROVIDER_SITE_OTHER): Payer: Self-pay | Admitting: Vascular Surgery

## 2016-11-11 VITALS — BP 146/97 | HR 109 | Resp 16 | Ht 67.0 in | Wt 179.0 lb

## 2016-11-11 DIAGNOSIS — Z992 Dependence on renal dialysis: Secondary | ICD-10-CM

## 2016-11-11 DIAGNOSIS — N186 End stage renal disease: Secondary | ICD-10-CM

## 2016-11-11 DIAGNOSIS — I10 Essential (primary) hypertension: Secondary | ICD-10-CM

## 2016-11-11 NOTE — Assessment & Plan Note (Signed)
blood pressure control important in reducing the progression of atherosclerotic disease. On appropriate oral medications.  

## 2016-11-11 NOTE — Assessment & Plan Note (Signed)
Her access is working well but the skin is certainly a questionable area. This area and has to be avoided for access use. She has plenty of other fistula can be used safely. If she has any bleeding or if this area becomes more problematic, she will need a surgical jump graft revision at some point in the future. I'll plan to see her back in a few months

## 2016-11-11 NOTE — Progress Notes (Signed)
MRN : [id number]  Ruth Gutierrez is a 51 y.o. ([birth year]) female who presents with chief complaint of  Chief Complaint  Patient presents with  . Re-evaluation    10 day wound check, left arm  .  History of Present Illness: Patient returns today in follow up of Skin breakdown at her left upper extremity AV fistula. The scab at the arterial access site is stable to slightly improved from her last visit. The thinning of the skin remains present at both the arterial and venous access sites. They have been accessing in different locations and avoiding the overused areas. She has not had any bleeding.  Current Outpatient Prescriptions  Medication Sig Dispense Refill  . amLODipine (NORVASC) 10 MG tablet Take 10 mg by mouth daily.     Marland Kitchen aspirin 81 MG tablet Take 81 mg by mouth daily.     Marland Kitchen atenolol (TENORMIN) 50 MG tablet Take 50 mg by mouth 2 (two) times daily.     . calcitRIOL (ROCALTROL) 0.5 MCG capsule Take 1 capsule (0.5 mcg total) by mouth every Monday, Wednesday, and Friday with hemodialysis.    Marland Kitchen calcium acetate (PHOSLO) 667 MG capsule Take 1,334-3,335 mg by mouth 3 (three) times daily with meals. Tales 5 caps with each meal, 2 caps with snacks    . diphenhydrAMINE (BENADRYL) 25 MG tablet Take 25 mg by mouth every 8 (eight) hours as needed (for cough).    . norethindrone (AYGESTIN) 5 MG tablet Take 5 mg by mouth 2 (two) times daily.   11   No current facility-administered medications for this visit.     Past Medical History:  Diagnosis Date  . Anemia of chronic disease   . Arthritis   . Deceased-donor kidney transplant    Performed at [facility], April 2010.  Initial ESRD due to HTN nephropathy  . Eczema   . ESRD (end stage renal disease) ([place])    s/p transplant creatinine baseline 1.1  M/W/F dialysis  . FUO (fever of unknown origin) 05/17/2015  . GERD (gastroesophageal reflux disease)   . Headache(784.0)   . History of hyperparathyroidism   . Hypertension   .  Shortness of breath   . Wears glasses     Past Surgical History:  Procedure Laterality Date  . AV FISTULA PLACEMENT    . BASCILIC VEIN TRANSPOSITION Left 10/30/2014   Procedure: LEFT BASCILIC VEIN TRANSPOSITION;  Surgeon: Rosetta Posner, MD;  Location: [place];  Service: Vascular;  Laterality: Left;  . BASCILIC VEIN TRANSPOSITION Left 01/03/2015   Procedure: LEFT ARM 2ND STAGE BASCILIC VEIN TRANSPOSITION;  Surgeon: Rosetta Posner, MD;  Location: [place];  Service: Vascular;  Laterality: Left;  . FRACTURE SURGERY     left foot,baby toe nad next toe missing  . INSERTION OF DIALYSIS CATHETER Right 10/30/2014   Procedure: INSERTION OF DIALYSIS CATHETER;  Surgeon: Rosetta Posner, MD;  Location: [place];  Service: Vascular;  Laterality: Right;  . KIDNEY TRANSPLANT  11/2008   [facility])  . WISDOM TOOTH EXTRACTION      Social History Social History  Substance Use Topics  . Smoking status: Current Every Day Smoker    Packs/day: 0.50    Years: 27.00    Types: Cigarettes  . Smokeless tobacco: Never Used     Comment: 10 cigarettes a day  . Alcohol use No     Comment: occasional drinker noted in the past    Family History Family History  Problem Relation Age  of Onset  . Hypertension Mother   . Hypertension Father   . Diabetes Brother   . Deep vein thrombosis Brother   . Hypertension Sister   . Hyperlipidemia Sister   . Kidney disease Brother     on HD    Allergies  Allergen Reactions  . Penicillin G Itching and Rash  . Penicillins Itching and Rash     REVIEW OF SYSTEMS (Negative unless checked)  Constitutional: [] Weight loss  [] Fever  [] Chills Cardiac: [] Chest pain   [] Chest pressure   [] Palpitations   [] Shortness of breath when laying flat   [] Shortness of breath at rest   [] Shortness of breath with exertion. Vascular:  [] Pain in legs with walking   [] Pain in legs at rest   [] Pain in legs when laying flat   [] Claudication   [] Pain in feet when walking  [] Pain in feet at rest   [] Pain in feet when laying flat   [] History of DVT   [] Phlebitis   [] Swelling in legs   [] Varicose veins   [x] Non-healing ulcers Pulmonary:   [] Uses home oxygen   [] Productive cough   [] Hemoptysis   [] Wheeze  [] COPD   [] Asthma Neurologic:  [] Dizziness  [] Blackouts   [] Seizures   [] History of stroke   [] History of TIA  [] Aphasia   [] Temporary blindness   [] Dysphagia   [] Weakness or numbness in arms   [] Weakness or numbness in legs Musculoskeletal:  [] Arthritis   [] Joint swelling   [] Joint pain   [] Low back pain Hematologic:  [] Easy bruising  [] Easy bleeding   [] Hypercoagulable state   [] Anemic   Gastrointestinal:  [] Blood in stool   [] Vomiting blood  [] Gastroesophageal reflux/heartburn   [] Abdominal pain Genitourinary:  [x] Chronic kidney disease   [] Difficult urination  [] Frequent urination  [] Burning with urination   [] Hematuria Skin:  [] Rashes   [x] Ulcers   [x] Wounds Psychological:  [] History of anxiety   []  History of major depression.  Physical Examination  BP (!) 146/97 (BP Location: Right Arm)   Pulse (!) 109   Resp 16   Ht 5\' 7"  (1.702 m)   Wt 179 lb (81.2 kg)   BMI 28.04 kg/m  Gen:  WD/WN, NAD Head: [place]/AT, No temporalis wasting. Ear/Nose/Throat: Hearing grossly intact, nares w/o erythema or drainage, trachea midline Eyes: Conjunctiva clear. Sclera non-icteric Neck: Supple.  No JVD.  Pulmonary:  Good air movement, no use of accessory muscles.  Cardiac: RRR, normal S1, S2 Vascular: Left upper arm AV fistula with good thrill and skin as described below. Vessel Right Left  Radial Palpable Palpable                                   Gastrointestinal: soft, non-tender/non-distended. No guarding/reflex.  Musculoskeletal: M/S 5/5 throughout.  No deformity or atrophy.  Neurologic: Sensation grossly intact in extremities.  Symmetrical.  Speech is fluent.  Psychiatric: Judgment intact, Mood & affect appropriate for pt's clinical situation. Dermatologic: Scab overlying her  arterial access site with thinning of the skin at both the arterial and venous access sites of her left upper arm AV fistula. Lymph : No Cervical, Axillary, or Inguinal lymphadenopathy.      Labs No results found for this or any previous visit (from the past 2160 hour(s)).  Radiology No results found.    Assessment/Plan  Hypertension blood pressure control important in reducing the progression of atherosclerotic disease. On appropriate oral medications.   ESRD on  dialysis [facility]) Her access is working well but the skin is certainly a questionable area. This area and has to be avoided for access use. She has plenty of other fistula can be used safely. If she has any bleeding or if this area becomes more problematic, she will need a surgical jump graft revision at some point in the future. I'll plan to see her back in a few months    Leotis Pain, MD  11/11/2016 1:48 PM    This note was created with Dragon medical transcription system.  Any errors from dictation are purely unintentional

## 2016-11-12 DIAGNOSIS — E1129 Type 2 diabetes mellitus with other diabetic kidney complication: Secondary | ICD-10-CM | POA: Diagnosis not present

## 2016-11-12 DIAGNOSIS — N2581 Secondary hyperparathyroidism of renal origin: Secondary | ICD-10-CM | POA: Diagnosis not present

## 2016-11-12 DIAGNOSIS — D509 Iron deficiency anemia, unspecified: Secondary | ICD-10-CM | POA: Diagnosis not present

## 2016-11-12 DIAGNOSIS — N186 End stage renal disease: Secondary | ICD-10-CM | POA: Diagnosis not present

## 2016-11-14 DIAGNOSIS — N2581 Secondary hyperparathyroidism of renal origin: Secondary | ICD-10-CM | POA: Diagnosis not present

## 2016-11-14 DIAGNOSIS — D509 Iron deficiency anemia, unspecified: Secondary | ICD-10-CM | POA: Diagnosis not present

## 2016-11-14 DIAGNOSIS — N186 End stage renal disease: Secondary | ICD-10-CM | POA: Diagnosis not present

## 2016-11-14 DIAGNOSIS — E1129 Type 2 diabetes mellitus with other diabetic kidney complication: Secondary | ICD-10-CM | POA: Diagnosis not present

## 2016-11-17 DIAGNOSIS — D509 Iron deficiency anemia, unspecified: Secondary | ICD-10-CM | POA: Diagnosis not present

## 2016-11-17 DIAGNOSIS — N186 End stage renal disease: Secondary | ICD-10-CM | POA: Diagnosis not present

## 2016-11-17 DIAGNOSIS — N2581 Secondary hyperparathyroidism of renal origin: Secondary | ICD-10-CM | POA: Diagnosis not present

## 2016-11-17 DIAGNOSIS — E1129 Type 2 diabetes mellitus with other diabetic kidney complication: Secondary | ICD-10-CM | POA: Diagnosis not present

## 2016-11-19 DIAGNOSIS — D509 Iron deficiency anemia, unspecified: Secondary | ICD-10-CM | POA: Diagnosis not present

## 2016-11-19 DIAGNOSIS — N2581 Secondary hyperparathyroidism of renal origin: Secondary | ICD-10-CM | POA: Diagnosis not present

## 2016-11-19 DIAGNOSIS — N186 End stage renal disease: Secondary | ICD-10-CM | POA: Diagnosis not present

## 2016-11-19 DIAGNOSIS — E1129 Type 2 diabetes mellitus with other diabetic kidney complication: Secondary | ICD-10-CM | POA: Diagnosis not present

## 2016-11-21 DIAGNOSIS — E1129 Type 2 diabetes mellitus with other diabetic kidney complication: Secondary | ICD-10-CM | POA: Diagnosis not present

## 2016-11-21 DIAGNOSIS — D509 Iron deficiency anemia, unspecified: Secondary | ICD-10-CM | POA: Diagnosis not present

## 2016-11-21 DIAGNOSIS — N2581 Secondary hyperparathyroidism of renal origin: Secondary | ICD-10-CM | POA: Diagnosis not present

## 2016-11-21 DIAGNOSIS — N186 End stage renal disease: Secondary | ICD-10-CM | POA: Diagnosis not present

## 2016-11-24 DIAGNOSIS — N186 End stage renal disease: Secondary | ICD-10-CM | POA: Diagnosis not present

## 2016-11-24 DIAGNOSIS — E1129 Type 2 diabetes mellitus with other diabetic kidney complication: Secondary | ICD-10-CM | POA: Diagnosis not present

## 2016-11-24 DIAGNOSIS — N2581 Secondary hyperparathyroidism of renal origin: Secondary | ICD-10-CM | POA: Diagnosis not present

## 2016-11-24 DIAGNOSIS — D509 Iron deficiency anemia, unspecified: Secondary | ICD-10-CM | POA: Diagnosis not present

## 2016-11-26 DIAGNOSIS — D509 Iron deficiency anemia, unspecified: Secondary | ICD-10-CM | POA: Diagnosis not present

## 2016-11-26 DIAGNOSIS — N2581 Secondary hyperparathyroidism of renal origin: Secondary | ICD-10-CM | POA: Diagnosis not present

## 2016-11-26 DIAGNOSIS — N186 End stage renal disease: Secondary | ICD-10-CM | POA: Diagnosis not present

## 2016-11-26 DIAGNOSIS — E1129 Type 2 diabetes mellitus with other diabetic kidney complication: Secondary | ICD-10-CM | POA: Diagnosis not present

## 2016-11-28 DIAGNOSIS — E1129 Type 2 diabetes mellitus with other diabetic kidney complication: Secondary | ICD-10-CM | POA: Diagnosis not present

## 2016-11-28 DIAGNOSIS — D509 Iron deficiency anemia, unspecified: Secondary | ICD-10-CM | POA: Diagnosis not present

## 2016-11-28 DIAGNOSIS — N186 End stage renal disease: Secondary | ICD-10-CM | POA: Diagnosis not present

## 2016-11-28 DIAGNOSIS — N2581 Secondary hyperparathyroidism of renal origin: Secondary | ICD-10-CM | POA: Diagnosis not present

## 2016-12-01 DIAGNOSIS — D509 Iron deficiency anemia, unspecified: Secondary | ICD-10-CM | POA: Diagnosis not present

## 2016-12-01 DIAGNOSIS — N186 End stage renal disease: Secondary | ICD-10-CM | POA: Diagnosis not present

## 2016-12-01 DIAGNOSIS — E1129 Type 2 diabetes mellitus with other diabetic kidney complication: Secondary | ICD-10-CM | POA: Diagnosis not present

## 2016-12-01 DIAGNOSIS — I129 Hypertensive chronic kidney disease with stage 1 through stage 4 chronic kidney disease, or unspecified chronic kidney disease: Secondary | ICD-10-CM | POA: Diagnosis not present

## 2016-12-01 DIAGNOSIS — Z992 Dependence on renal dialysis: Secondary | ICD-10-CM | POA: Diagnosis not present

## 2016-12-01 DIAGNOSIS — N2581 Secondary hyperparathyroidism of renal origin: Secondary | ICD-10-CM | POA: Diagnosis not present

## 2016-12-03 DIAGNOSIS — D509 Iron deficiency anemia, unspecified: Secondary | ICD-10-CM | POA: Diagnosis not present

## 2016-12-03 DIAGNOSIS — E1129 Type 2 diabetes mellitus with other diabetic kidney complication: Secondary | ICD-10-CM | POA: Diagnosis not present

## 2016-12-03 DIAGNOSIS — D631 Anemia in chronic kidney disease: Secondary | ICD-10-CM | POA: Diagnosis not present

## 2016-12-03 DIAGNOSIS — N186 End stage renal disease: Secondary | ICD-10-CM | POA: Diagnosis not present

## 2016-12-03 DIAGNOSIS — N2581 Secondary hyperparathyroidism of renal origin: Secondary | ICD-10-CM | POA: Diagnosis not present

## 2016-12-05 DIAGNOSIS — E1129 Type 2 diabetes mellitus with other diabetic kidney complication: Secondary | ICD-10-CM | POA: Diagnosis not present

## 2016-12-05 DIAGNOSIS — N2581 Secondary hyperparathyroidism of renal origin: Secondary | ICD-10-CM | POA: Diagnosis not present

## 2016-12-05 DIAGNOSIS — N186 End stage renal disease: Secondary | ICD-10-CM | POA: Diagnosis not present

## 2016-12-05 DIAGNOSIS — D509 Iron deficiency anemia, unspecified: Secondary | ICD-10-CM | POA: Diagnosis not present

## 2016-12-05 DIAGNOSIS — D631 Anemia in chronic kidney disease: Secondary | ICD-10-CM | POA: Diagnosis not present

## 2016-12-08 DIAGNOSIS — E1129 Type 2 diabetes mellitus with other diabetic kidney complication: Secondary | ICD-10-CM | POA: Diagnosis not present

## 2016-12-08 DIAGNOSIS — N186 End stage renal disease: Secondary | ICD-10-CM | POA: Diagnosis not present

## 2016-12-08 DIAGNOSIS — D509 Iron deficiency anemia, unspecified: Secondary | ICD-10-CM | POA: Diagnosis not present

## 2016-12-08 DIAGNOSIS — D631 Anemia in chronic kidney disease: Secondary | ICD-10-CM | POA: Diagnosis not present

## 2016-12-08 DIAGNOSIS — N2581 Secondary hyperparathyroidism of renal origin: Secondary | ICD-10-CM | POA: Diagnosis not present

## 2016-12-10 DIAGNOSIS — D631 Anemia in chronic kidney disease: Secondary | ICD-10-CM | POA: Diagnosis not present

## 2016-12-10 DIAGNOSIS — N2581 Secondary hyperparathyroidism of renal origin: Secondary | ICD-10-CM | POA: Diagnosis not present

## 2016-12-10 DIAGNOSIS — D509 Iron deficiency anemia, unspecified: Secondary | ICD-10-CM | POA: Diagnosis not present

## 2016-12-10 DIAGNOSIS — N186 End stage renal disease: Secondary | ICD-10-CM | POA: Diagnosis not present

## 2016-12-10 DIAGNOSIS — E1129 Type 2 diabetes mellitus with other diabetic kidney complication: Secondary | ICD-10-CM | POA: Diagnosis not present

## 2016-12-12 DIAGNOSIS — D631 Anemia in chronic kidney disease: Secondary | ICD-10-CM | POA: Diagnosis not present

## 2016-12-12 DIAGNOSIS — D509 Iron deficiency anemia, unspecified: Secondary | ICD-10-CM | POA: Diagnosis not present

## 2016-12-12 DIAGNOSIS — N2581 Secondary hyperparathyroidism of renal origin: Secondary | ICD-10-CM | POA: Diagnosis not present

## 2016-12-12 DIAGNOSIS — N186 End stage renal disease: Secondary | ICD-10-CM | POA: Diagnosis not present

## 2016-12-12 DIAGNOSIS — E1129 Type 2 diabetes mellitus with other diabetic kidney complication: Secondary | ICD-10-CM | POA: Diagnosis not present

## 2016-12-15 DIAGNOSIS — D631 Anemia in chronic kidney disease: Secondary | ICD-10-CM | POA: Diagnosis not present

## 2016-12-15 DIAGNOSIS — N186 End stage renal disease: Secondary | ICD-10-CM | POA: Diagnosis not present

## 2016-12-15 DIAGNOSIS — D509 Iron deficiency anemia, unspecified: Secondary | ICD-10-CM | POA: Diagnosis not present

## 2016-12-15 DIAGNOSIS — E1129 Type 2 diabetes mellitus with other diabetic kidney complication: Secondary | ICD-10-CM | POA: Diagnosis not present

## 2016-12-15 DIAGNOSIS — N2581 Secondary hyperparathyroidism of renal origin: Secondary | ICD-10-CM | POA: Diagnosis not present

## 2016-12-17 DIAGNOSIS — N2581 Secondary hyperparathyroidism of renal origin: Secondary | ICD-10-CM | POA: Diagnosis not present

## 2016-12-17 DIAGNOSIS — N186 End stage renal disease: Secondary | ICD-10-CM | POA: Diagnosis not present

## 2016-12-17 DIAGNOSIS — E1129 Type 2 diabetes mellitus with other diabetic kidney complication: Secondary | ICD-10-CM | POA: Diagnosis not present

## 2016-12-17 DIAGNOSIS — D631 Anemia in chronic kidney disease: Secondary | ICD-10-CM | POA: Diagnosis not present

## 2016-12-17 DIAGNOSIS — D509 Iron deficiency anemia, unspecified: Secondary | ICD-10-CM | POA: Diagnosis not present

## 2016-12-19 DIAGNOSIS — D509 Iron deficiency anemia, unspecified: Secondary | ICD-10-CM | POA: Diagnosis not present

## 2016-12-19 DIAGNOSIS — N2581 Secondary hyperparathyroidism of renal origin: Secondary | ICD-10-CM | POA: Diagnosis not present

## 2016-12-19 DIAGNOSIS — D631 Anemia in chronic kidney disease: Secondary | ICD-10-CM | POA: Diagnosis not present

## 2016-12-19 DIAGNOSIS — N186 End stage renal disease: Secondary | ICD-10-CM | POA: Diagnosis not present

## 2016-12-19 DIAGNOSIS — E1129 Type 2 diabetes mellitus with other diabetic kidney complication: Secondary | ICD-10-CM | POA: Diagnosis not present

## 2016-12-22 DIAGNOSIS — D631 Anemia in chronic kidney disease: Secondary | ICD-10-CM | POA: Diagnosis not present

## 2016-12-22 DIAGNOSIS — N2581 Secondary hyperparathyroidism of renal origin: Secondary | ICD-10-CM | POA: Diagnosis not present

## 2016-12-22 DIAGNOSIS — N186 End stage renal disease: Secondary | ICD-10-CM | POA: Diagnosis not present

## 2016-12-22 DIAGNOSIS — D509 Iron deficiency anemia, unspecified: Secondary | ICD-10-CM | POA: Diagnosis not present

## 2016-12-22 DIAGNOSIS — E1129 Type 2 diabetes mellitus with other diabetic kidney complication: Secondary | ICD-10-CM | POA: Diagnosis not present

## 2016-12-24 DIAGNOSIS — D509 Iron deficiency anemia, unspecified: Secondary | ICD-10-CM | POA: Diagnosis not present

## 2016-12-24 DIAGNOSIS — N186 End stage renal disease: Secondary | ICD-10-CM | POA: Diagnosis not present

## 2016-12-24 DIAGNOSIS — D631 Anemia in chronic kidney disease: Secondary | ICD-10-CM | POA: Diagnosis not present

## 2016-12-24 DIAGNOSIS — N2581 Secondary hyperparathyroidism of renal origin: Secondary | ICD-10-CM | POA: Diagnosis not present

## 2016-12-24 DIAGNOSIS — E1129 Type 2 diabetes mellitus with other diabetic kidney complication: Secondary | ICD-10-CM | POA: Diagnosis not present

## 2016-12-26 DIAGNOSIS — E1129 Type 2 diabetes mellitus with other diabetic kidney complication: Secondary | ICD-10-CM | POA: Diagnosis not present

## 2016-12-26 DIAGNOSIS — D631 Anemia in chronic kidney disease: Secondary | ICD-10-CM | POA: Diagnosis not present

## 2016-12-26 DIAGNOSIS — N186 End stage renal disease: Secondary | ICD-10-CM | POA: Diagnosis not present

## 2016-12-26 DIAGNOSIS — D509 Iron deficiency anemia, unspecified: Secondary | ICD-10-CM | POA: Diagnosis not present

## 2016-12-26 DIAGNOSIS — N2581 Secondary hyperparathyroidism of renal origin: Secondary | ICD-10-CM | POA: Diagnosis not present

## 2016-12-29 DIAGNOSIS — D631 Anemia in chronic kidney disease: Secondary | ICD-10-CM | POA: Diagnosis not present

## 2016-12-29 DIAGNOSIS — N2581 Secondary hyperparathyroidism of renal origin: Secondary | ICD-10-CM | POA: Diagnosis not present

## 2016-12-29 DIAGNOSIS — N186 End stage renal disease: Secondary | ICD-10-CM | POA: Diagnosis not present

## 2016-12-29 DIAGNOSIS — D509 Iron deficiency anemia, unspecified: Secondary | ICD-10-CM | POA: Diagnosis not present

## 2016-12-29 DIAGNOSIS — E1129 Type 2 diabetes mellitus with other diabetic kidney complication: Secondary | ICD-10-CM | POA: Diagnosis not present

## 2016-12-31 DIAGNOSIS — N186 End stage renal disease: Secondary | ICD-10-CM | POA: Diagnosis not present

## 2016-12-31 DIAGNOSIS — D509 Iron deficiency anemia, unspecified: Secondary | ICD-10-CM | POA: Diagnosis not present

## 2016-12-31 DIAGNOSIS — N2581 Secondary hyperparathyroidism of renal origin: Secondary | ICD-10-CM | POA: Diagnosis not present

## 2016-12-31 DIAGNOSIS — D631 Anemia in chronic kidney disease: Secondary | ICD-10-CM | POA: Diagnosis not present

## 2016-12-31 DIAGNOSIS — E1129 Type 2 diabetes mellitus with other diabetic kidney complication: Secondary | ICD-10-CM | POA: Diagnosis not present

## 2017-01-01 DIAGNOSIS — N186 End stage renal disease: Secondary | ICD-10-CM | POA: Diagnosis not present

## 2017-01-01 DIAGNOSIS — I129 Hypertensive chronic kidney disease with stage 1 through stage 4 chronic kidney disease, or unspecified chronic kidney disease: Secondary | ICD-10-CM | POA: Diagnosis not present

## 2017-01-01 DIAGNOSIS — Z992 Dependence on renal dialysis: Secondary | ICD-10-CM | POA: Diagnosis not present

## 2017-01-02 DIAGNOSIS — N186 End stage renal disease: Secondary | ICD-10-CM | POA: Diagnosis not present

## 2017-01-02 DIAGNOSIS — N2581 Secondary hyperparathyroidism of renal origin: Secondary | ICD-10-CM | POA: Diagnosis not present

## 2017-01-02 DIAGNOSIS — E1129 Type 2 diabetes mellitus with other diabetic kidney complication: Secondary | ICD-10-CM | POA: Diagnosis not present

## 2017-01-02 DIAGNOSIS — D509 Iron deficiency anemia, unspecified: Secondary | ICD-10-CM | POA: Diagnosis not present

## 2017-01-05 DIAGNOSIS — D509 Iron deficiency anemia, unspecified: Secondary | ICD-10-CM | POA: Diagnosis not present

## 2017-01-05 DIAGNOSIS — N2581 Secondary hyperparathyroidism of renal origin: Secondary | ICD-10-CM | POA: Diagnosis not present

## 2017-01-05 DIAGNOSIS — N186 End stage renal disease: Secondary | ICD-10-CM | POA: Diagnosis not present

## 2017-01-05 DIAGNOSIS — E1129 Type 2 diabetes mellitus with other diabetic kidney complication: Secondary | ICD-10-CM | POA: Diagnosis not present

## 2017-01-07 DIAGNOSIS — N2581 Secondary hyperparathyroidism of renal origin: Secondary | ICD-10-CM | POA: Diagnosis not present

## 2017-01-07 DIAGNOSIS — D509 Iron deficiency anemia, unspecified: Secondary | ICD-10-CM | POA: Diagnosis not present

## 2017-01-07 DIAGNOSIS — E1129 Type 2 diabetes mellitus with other diabetic kidney complication: Secondary | ICD-10-CM | POA: Diagnosis not present

## 2017-01-07 DIAGNOSIS — N186 End stage renal disease: Secondary | ICD-10-CM | POA: Diagnosis not present

## 2017-01-09 DIAGNOSIS — D509 Iron deficiency anemia, unspecified: Secondary | ICD-10-CM | POA: Diagnosis not present

## 2017-01-09 DIAGNOSIS — N186 End stage renal disease: Secondary | ICD-10-CM | POA: Diagnosis not present

## 2017-01-09 DIAGNOSIS — N2581 Secondary hyperparathyroidism of renal origin: Secondary | ICD-10-CM | POA: Diagnosis not present

## 2017-01-09 DIAGNOSIS — E1129 Type 2 diabetes mellitus with other diabetic kidney complication: Secondary | ICD-10-CM | POA: Diagnosis not present

## 2017-01-12 DIAGNOSIS — D509 Iron deficiency anemia, unspecified: Secondary | ICD-10-CM | POA: Diagnosis not present

## 2017-01-12 DIAGNOSIS — E1129 Type 2 diabetes mellitus with other diabetic kidney complication: Secondary | ICD-10-CM | POA: Diagnosis not present

## 2017-01-12 DIAGNOSIS — N186 End stage renal disease: Secondary | ICD-10-CM | POA: Diagnosis not present

## 2017-01-12 DIAGNOSIS — N2581 Secondary hyperparathyroidism of renal origin: Secondary | ICD-10-CM | POA: Diagnosis not present

## 2017-01-14 DIAGNOSIS — D509 Iron deficiency anemia, unspecified: Secondary | ICD-10-CM | POA: Diagnosis not present

## 2017-01-14 DIAGNOSIS — E1129 Type 2 diabetes mellitus with other diabetic kidney complication: Secondary | ICD-10-CM | POA: Diagnosis not present

## 2017-01-14 DIAGNOSIS — N2581 Secondary hyperparathyroidism of renal origin: Secondary | ICD-10-CM | POA: Diagnosis not present

## 2017-01-14 DIAGNOSIS — N186 End stage renal disease: Secondary | ICD-10-CM | POA: Diagnosis not present

## 2017-01-16 DIAGNOSIS — N186 End stage renal disease: Secondary | ICD-10-CM | POA: Diagnosis not present

## 2017-01-16 DIAGNOSIS — E1129 Type 2 diabetes mellitus with other diabetic kidney complication: Secondary | ICD-10-CM | POA: Diagnosis not present

## 2017-01-16 DIAGNOSIS — N2581 Secondary hyperparathyroidism of renal origin: Secondary | ICD-10-CM | POA: Diagnosis not present

## 2017-01-16 DIAGNOSIS — D509 Iron deficiency anemia, unspecified: Secondary | ICD-10-CM | POA: Diagnosis not present

## 2017-01-19 DIAGNOSIS — N2581 Secondary hyperparathyroidism of renal origin: Secondary | ICD-10-CM | POA: Diagnosis not present

## 2017-01-19 DIAGNOSIS — N186 End stage renal disease: Secondary | ICD-10-CM | POA: Diagnosis not present

## 2017-01-19 DIAGNOSIS — E1129 Type 2 diabetes mellitus with other diabetic kidney complication: Secondary | ICD-10-CM | POA: Diagnosis not present

## 2017-01-19 DIAGNOSIS — D509 Iron deficiency anemia, unspecified: Secondary | ICD-10-CM | POA: Diagnosis not present

## 2017-01-21 DIAGNOSIS — N2581 Secondary hyperparathyroidism of renal origin: Secondary | ICD-10-CM | POA: Diagnosis not present

## 2017-01-21 DIAGNOSIS — D509 Iron deficiency anemia, unspecified: Secondary | ICD-10-CM | POA: Diagnosis not present

## 2017-01-21 DIAGNOSIS — N186 End stage renal disease: Secondary | ICD-10-CM | POA: Diagnosis not present

## 2017-01-21 DIAGNOSIS — E1129 Type 2 diabetes mellitus with other diabetic kidney complication: Secondary | ICD-10-CM | POA: Diagnosis not present

## 2017-01-23 DIAGNOSIS — N2581 Secondary hyperparathyroidism of renal origin: Secondary | ICD-10-CM | POA: Diagnosis not present

## 2017-01-23 DIAGNOSIS — D509 Iron deficiency anemia, unspecified: Secondary | ICD-10-CM | POA: Diagnosis not present

## 2017-01-23 DIAGNOSIS — E1129 Type 2 diabetes mellitus with other diabetic kidney complication: Secondary | ICD-10-CM | POA: Diagnosis not present

## 2017-01-23 DIAGNOSIS — N186 End stage renal disease: Secondary | ICD-10-CM | POA: Diagnosis not present

## 2017-01-26 DIAGNOSIS — N2581 Secondary hyperparathyroidism of renal origin: Secondary | ICD-10-CM | POA: Diagnosis not present

## 2017-01-26 DIAGNOSIS — D509 Iron deficiency anemia, unspecified: Secondary | ICD-10-CM | POA: Diagnosis not present

## 2017-01-26 DIAGNOSIS — N186 End stage renal disease: Secondary | ICD-10-CM | POA: Diagnosis not present

## 2017-01-26 DIAGNOSIS — E1129 Type 2 diabetes mellitus with other diabetic kidney complication: Secondary | ICD-10-CM | POA: Diagnosis not present

## 2017-01-28 DIAGNOSIS — D509 Iron deficiency anemia, unspecified: Secondary | ICD-10-CM | POA: Diagnosis not present

## 2017-01-28 DIAGNOSIS — N2581 Secondary hyperparathyroidism of renal origin: Secondary | ICD-10-CM | POA: Diagnosis not present

## 2017-01-28 DIAGNOSIS — N186 End stage renal disease: Secondary | ICD-10-CM | POA: Diagnosis not present

## 2017-01-28 DIAGNOSIS — E1129 Type 2 diabetes mellitus with other diabetic kidney complication: Secondary | ICD-10-CM | POA: Diagnosis not present

## 2017-01-30 DIAGNOSIS — N2581 Secondary hyperparathyroidism of renal origin: Secondary | ICD-10-CM | POA: Diagnosis not present

## 2017-01-30 DIAGNOSIS — D509 Iron deficiency anemia, unspecified: Secondary | ICD-10-CM | POA: Diagnosis not present

## 2017-01-30 DIAGNOSIS — E1129 Type 2 diabetes mellitus with other diabetic kidney complication: Secondary | ICD-10-CM | POA: Diagnosis not present

## 2017-01-30 DIAGNOSIS — N186 End stage renal disease: Secondary | ICD-10-CM | POA: Diagnosis not present

## 2017-01-31 DIAGNOSIS — I129 Hypertensive chronic kidney disease with stage 1 through stage 4 chronic kidney disease, or unspecified chronic kidney disease: Secondary | ICD-10-CM | POA: Diagnosis not present

## 2017-01-31 DIAGNOSIS — N186 End stage renal disease: Secondary | ICD-10-CM | POA: Diagnosis not present

## 2017-01-31 DIAGNOSIS — Z992 Dependence on renal dialysis: Secondary | ICD-10-CM | POA: Diagnosis not present

## 2017-02-02 DIAGNOSIS — N186 End stage renal disease: Secondary | ICD-10-CM | POA: Diagnosis not present

## 2017-02-02 DIAGNOSIS — E1129 Type 2 diabetes mellitus with other diabetic kidney complication: Secondary | ICD-10-CM | POA: Diagnosis not present

## 2017-02-02 DIAGNOSIS — N2581 Secondary hyperparathyroidism of renal origin: Secondary | ICD-10-CM | POA: Diagnosis not present

## 2017-02-02 DIAGNOSIS — D631 Anemia in chronic kidney disease: Secondary | ICD-10-CM | POA: Diagnosis not present

## 2017-02-04 DIAGNOSIS — E1129 Type 2 diabetes mellitus with other diabetic kidney complication: Secondary | ICD-10-CM | POA: Diagnosis not present

## 2017-02-04 DIAGNOSIS — N186 End stage renal disease: Secondary | ICD-10-CM | POA: Diagnosis not present

## 2017-02-04 DIAGNOSIS — D631 Anemia in chronic kidney disease: Secondary | ICD-10-CM | POA: Diagnosis not present

## 2017-02-04 DIAGNOSIS — N2581 Secondary hyperparathyroidism of renal origin: Secondary | ICD-10-CM | POA: Diagnosis not present

## 2017-02-06 DIAGNOSIS — N186 End stage renal disease: Secondary | ICD-10-CM | POA: Diagnosis not present

## 2017-02-06 DIAGNOSIS — N2581 Secondary hyperparathyroidism of renal origin: Secondary | ICD-10-CM | POA: Diagnosis not present

## 2017-02-06 DIAGNOSIS — D631 Anemia in chronic kidney disease: Secondary | ICD-10-CM | POA: Diagnosis not present

## 2017-02-06 DIAGNOSIS — E1129 Type 2 diabetes mellitus with other diabetic kidney complication: Secondary | ICD-10-CM | POA: Diagnosis not present

## 2017-02-09 DIAGNOSIS — N186 End stage renal disease: Secondary | ICD-10-CM | POA: Diagnosis not present

## 2017-02-09 DIAGNOSIS — E1129 Type 2 diabetes mellitus with other diabetic kidney complication: Secondary | ICD-10-CM | POA: Diagnosis not present

## 2017-02-09 DIAGNOSIS — D631 Anemia in chronic kidney disease: Secondary | ICD-10-CM | POA: Diagnosis not present

## 2017-02-09 DIAGNOSIS — N2581 Secondary hyperparathyroidism of renal origin: Secondary | ICD-10-CM | POA: Diagnosis not present

## 2017-02-10 ENCOUNTER — Encounter (INDEPENDENT_AMBULATORY_CARE_PROVIDER_SITE_OTHER): Payer: Self-pay | Admitting: Vascular Surgery

## 2017-02-10 ENCOUNTER — Ambulatory Visit (INDEPENDENT_AMBULATORY_CARE_PROVIDER_SITE_OTHER): Payer: Medicare Other | Admitting: Vascular Surgery

## 2017-02-10 ENCOUNTER — Other Ambulatory Visit (INDEPENDENT_AMBULATORY_CARE_PROVIDER_SITE_OTHER): Payer: Self-pay

## 2017-02-10 ENCOUNTER — Ambulatory Visit (INDEPENDENT_AMBULATORY_CARE_PROVIDER_SITE_OTHER): Payer: Medicare Other

## 2017-02-10 ENCOUNTER — Encounter (INDEPENDENT_AMBULATORY_CARE_PROVIDER_SITE_OTHER): Payer: Self-pay

## 2017-02-10 VITALS — BP 153/87 | HR 66 | Resp 16 | Ht 67.0 in | Wt 174.0 lb

## 2017-02-10 DIAGNOSIS — N186 End stage renal disease: Secondary | ICD-10-CM

## 2017-02-10 DIAGNOSIS — I10 Essential (primary) hypertension: Secondary | ICD-10-CM | POA: Diagnosis not present

## 2017-02-10 DIAGNOSIS — Z992 Dependence on renal dialysis: Secondary | ICD-10-CM | POA: Diagnosis not present

## 2017-02-10 NOTE — Assessment & Plan Note (Signed)
Her duplex today shows significantly elevated velocities at what appeared to be the axillary vein and basilic vein confluence. There is also some thrombus present in this location. The remainder of the access appears to be patent but with significant aneurysmal degeneration of the arterial and venous access sites. Particularly given her prolonged bleeding and skin situation I think she should undergo a fistulogram with intervention. I have discussed the risks and benefits. Hopefully this would depressurize the access. This will be done in the near future at her convenience.

## 2017-02-10 NOTE — Patient Instructions (Signed)
Angiogram  An angiogram, also called angiography, is a procedure used to look at the blood vessels. In this procedure, dye is injected through a long, thin tube (catheter) into an artery. X-rays are then taken. The X-rays will show if there is a blockage or problem in a blood vessel.  Tell a health care provider about:   Any allergies you have, including allergies to shellfish or contrast dye.   All medicines you are taking, including vitamins, herbs, eye drops, creams, and over-the-counter medicines.   Any problems you or family members have had with anesthetic medicines.   Any blood disorders you have.   Any surgeries you have had.   Any previous kidney problems or failure you have had.   Any medical conditions you have.   Possibility of pregnancy, if this applies.  What are the risks?  Generally, an angiogram is a safe procedure. However, as with any procedure, problems can occur. Possible problems include:   Injury to the blood vessels, including rupture or bleeding.   Infection or bruising at the catheter site.   Allergic reaction to the dye or contrast used.   Kidney damage from the dye or contrast used.   Blood clots that can lead to a stroke or heart attack.    What happens before the procedure?   Do not eat or drink after midnight on the night before the procedure, or as directed by your health care provider.   Ask your health care provider if you may drink enough water to take any needed medicines the morning of the procedure.  What happens during the procedure?   You may be given a medicine to help you relax (sedative) before and during the procedure. This medicine is given through an IV access tube that is inserted into one of your veins.   The area where the catheter will be inserted will be washed and shaved. This is usually done in the groin but may be done in the fold of your arm (near your elbow) or in the wrist.   A medicine will be given to numb the area where the catheter will  be inserted (local anesthetic).   The catheter will be inserted with a guide wire into an artery. The catheter is guided by using a type of X-ray (fluoroscopy) to the blood vessel being examined.   Dye is then injected into the catheter, and X-rays are taken. The dye helps to show where any narrowing or blockages are located.  What happens after the procedure?   If the procedure is done through the leg, you will be kept in bed lying flat for several hours. You will be instructed to not bend or cross your legs.   The insertion site will be checked frequently.   The pulse in your feet or wrist will be checked frequently.   Additional blood tests, X-rays, and electrocardiography may be done.   You may need to stay in the hospital overnight for observation.  This information is not intended to replace advice given to you by your health care provider. Make sure you discuss any questions you have with your health care provider.  Document Released: 04/30/2005 Document Revised: 01/02/2016 Document Reviewed: 12/22/2012  Elsevier Interactive Patient Education  2017 [employer]

## 2017-02-10 NOTE — Assessment & Plan Note (Signed)
blood pressure control important in reducing the progression of atherosclerotic disease. On appropriate oral medications.  

## 2017-02-10 NOTE — Progress Notes (Signed)
MRN : [id number]  Ruth Gutierrez is a 51 y.o. (1966-04-23) female who presents with chief complaint of  Chief Complaint  Patient presents with  . Re-evaluation    3 month HDA follow up  .  History of Present Illness: Patient returns today in follow up of her dialysis access.  She still has thinning skin at her access sites but no overt ulceration or infection. She says they have had several episodes of prolonged bleeding with dialysis. She denies any fever or chills. Her duplex today shows significantly elevated velocities at what appeared to be the axillary vein and basilic vein confluence. There is also some thrombus present in this location. The remainder of the access appears to be patent but with significant aneurysmal degeneration of the arterial and venous access sites.  Current Outpatient Prescriptions  Medication Sig Dispense Refill  . amLODipine (NORVASC) 10 MG tablet Take 10 mg by mouth daily.     Marland Kitchen aspirin 81 MG tablet Take 81 mg by mouth daily.     Marland Kitchen atenolol (TENORMIN) 50 MG tablet Take 50 mg by mouth 2 (two) times daily.     . calcitRIOL (ROCALTROL) 0.5 MCG capsule Take 1 capsule (0.5 mcg total) by mouth every Monday, Wednesday, and Friday with hemodialysis.    Marland Kitchen calcium acetate (PHOSLO) 667 MG capsule Take 1,334-3,335 mg by mouth 3 (three) times daily with meals. Tales 5 caps with each meal, 2 caps with snacks    . diphenhydrAMINE (BENADRYL) 25 MG tablet Take 25 mg by mouth every 8 (eight) hours as needed (for cough).    . norethindrone (AYGESTIN) 5 MG tablet Take 5 mg by mouth 2 (two) times daily.   11   No current facility-administered medications for this visit.         Past Medical History:  Diagnosis Date  . Anemia of chronic disease   . Arthritis   . Deceased-donor kidney transplant    Performed at [facility], April 2010.  Initial ESRD due to HTN nephropathy  . Eczema   . ESRD (end stage renal disease) ([place])    s/p transplant  creatinine baseline 1.1  M/W/F dialysis  . FUO (fever of unknown origin) 05/17/2015  . GERD (gastroesophageal reflux disease)   . Headache(784.0)   . History of hyperparathyroidism   . Hypertension   . Shortness of breath   . Wears glasses          Past Surgical History:  Procedure Laterality Date  . AV FISTULA PLACEMENT    . BASCILIC VEIN TRANSPOSITION Left 10/30/2014   Procedure: LEFT BASCILIC VEIN TRANSPOSITION;  Surgeon: Rosetta Posner, MD;  Location: [place];  Service: Vascular;  Laterality: Left;  . BASCILIC VEIN TRANSPOSITION Left 01/03/2015   Procedure: LEFT ARM 2ND STAGE BASCILIC VEIN TRANSPOSITION;  Surgeon: Rosetta Posner, MD;  Location: [place];  Service: Vascular;  Laterality: Left;  . FRACTURE SURGERY     left foot,baby toe nad next toe missing  . INSERTION OF DIALYSIS CATHETER Right 10/30/2014   Procedure: INSERTION OF DIALYSIS CATHETER;  Surgeon: Rosetta Posner, MD;  Location: [place];  Service: Vascular;  Laterality: Right;  . KIDNEY TRANSPLANT  11/2008   [facility])  . WISDOM TOOTH EXTRACTION      Social History       Social History  Substance Use Topics  . Smoking status: Current Every Day Smoker    Packs/day: 0.50    Years: 27.00    Types: Cigarettes  .  Smokeless tobacco: Never Used     Comment: 10 cigarettes a day  . Alcohol use No     Comment: occasional drinker noted in the past    Family History       Family History  Problem Relation Age of Onset  . Hypertension Mother   . Hypertension Father   . Diabetes Brother   . Deep vein thrombosis Brother   . Hypertension Sister   . Hyperlipidemia Sister   . Kidney disease Brother     on HD        Allergies  Allergen Reactions  . Penicillin G Itching and Rash  . Penicillins Itching and Rash     REVIEW OF SYSTEMS (Negative unless checked)  Constitutional: [] Weight loss  [] Fever  [] Chills Cardiac: [] Chest pain   [] Chest pressure   [] Palpitations    [] Shortness of breath when laying flat   [] Shortness of breath at rest   [] Shortness of breath with exertion. Vascular:  [] Pain in legs with walking   [] Pain in legs at rest   [] Pain in legs when laying flat   [] Claudication   [] Pain in feet when walking  [] Pain in feet at rest  [] Pain in feet when laying flat   [] History of DVT   [] Phlebitis   [] Swelling in legs   [] Varicose veins   [x] Non-healing ulcers Pulmonary:   [] Uses home oxygen   [] Productive cough   [] Hemoptysis   [] Wheeze  [] COPD   [] Asthma Neurologic:  [] Dizziness  [] Blackouts   [] Seizures   [] History of stroke   [] History of TIA  [] Aphasia   [] Temporary blindness   [] Dysphagia   [] Weakness or numbness in arms   [] Weakness or numbness in legs Musculoskeletal:  [] Arthritis   [] Joint swelling   [] Joint pain   [] Low back pain Hematologic:  [] Easy bruising  [x] Easy bleeding   [] Hypercoagulable state   [] Anemic   Gastrointestinal:  [] Blood in stool   [] Vomiting blood  [] Gastroesophageal reflux/heartburn   [] Abdominal pain Genitourinary:  [x] Chronic kidney disease   [] Difficult urination  [] Frequent urination  [] Burning with urination   [] Hematuria Skin:  [] Rashes   [x] Ulcers   [x] Wounds Psychological:  [] History of anxiety   []  History of major depression.    Physical Examination  BP (!) 153/87 (BP Location: Right Arm)   Pulse 66   Resp 16   Ht 5\' 7"  (1.702 m)   Wt 174 lb (78.9 kg)   BMI 27.25 kg/m  Gen:  WD/WN, NAD Head: [place]/AT, No temporalis wasting. Ear/Nose/Throat: Hearing grossly intact, nares w/o erythema or drainage, trachea midline Eyes: Conjunctiva clear. Sclera non-icteric Neck: Supple.  No JVD.  Pulmonary:  Good air movement, no use of accessory muscles.  Cardiac: RRR, normal S1, S2 Vascular: Aneurysmal left arm AV fistula with thin skin but no overt ulcerations at this time. Vessel Right Left  Radial Palpable Palpable                                    Musculoskeletal: M/S 5/5 throughout.  No deformity or  atrophy. No edema. Neurologic: Sensation grossly intact in extremities.  Symmetrical.  Speech is fluent.  Psychiatric: Judgment intact, Mood & affect appropriate for pt's clinical situation. Dermatologic: access sites as above       Labs No results found for this or any previous visit (from the past 2160 hour(s)).  Radiology No results found.   Assessment/Plan  Hypertension  blood pressure control important in reducing the progression of atherosclerotic disease. On appropriate oral medications.   ESRD on dialysis [facility]) Her duplex today shows significantly elevated velocities at what appeared to be the axillary vein and basilic vein confluence. There is also some thrombus present in this location. The remainder of the access appears to be patent but with significant aneurysmal degeneration of the arterial and venous access sites. Particularly given her prolonged bleeding and skin situation I think she should undergo a fistulogram with intervention. I have discussed the risks and benefits. Hopefully this would depressurize the access. This will be done in the near future at her convenience.    Leotis Pain, MD  02/10/2017 11:00 AM    This note was created with Dragon medical transcription system.  Any errors from dictation are purely unintentional

## 2017-02-11 DIAGNOSIS — N186 End stage renal disease: Secondary | ICD-10-CM | POA: Diagnosis not present

## 2017-02-11 DIAGNOSIS — D631 Anemia in chronic kidney disease: Secondary | ICD-10-CM | POA: Diagnosis not present

## 2017-02-11 DIAGNOSIS — N2581 Secondary hyperparathyroidism of renal origin: Secondary | ICD-10-CM | POA: Diagnosis not present

## 2017-02-11 DIAGNOSIS — E1129 Type 2 diabetes mellitus with other diabetic kidney complication: Secondary | ICD-10-CM | POA: Diagnosis not present

## 2017-02-11 MED ORDER — CLINDAMYCIN PHOSPHATE 300 MG/50ML IV SOLN
300.0000 mg | Freq: Once | INTRAVENOUS | Status: AC
  Administered 2017-02-12: 300 mg via INTRAVENOUS

## 2017-02-12 ENCOUNTER — Encounter
Admission: RE | Disposition: A | Discharge status: Home / Self Care | Payer: Self-pay | Source: Ambulatory Visit | Attending: Vascular Surgery

## 2017-02-12 ENCOUNTER — Ambulatory Visit
Admission: RE | Admit: 2017-02-12 | Discharge: 2017-02-12 | Disposition: A | Discharge status: Home / Self Care | Payer: Medicare Other | Source: Ambulatory Visit | Attending: Vascular Surgery | Admitting: Vascular Surgery

## 2017-02-12 DIAGNOSIS — Z880 Allergy status to penicillin: Secondary | ICD-10-CM | POA: Diagnosis not present

## 2017-02-12 DIAGNOSIS — M1990 Unspecified osteoarthritis, unspecified site: Secondary | ICD-10-CM | POA: Diagnosis not present

## 2017-02-12 DIAGNOSIS — K219 Gastro-esophageal reflux disease without esophagitis: Secondary | ICD-10-CM | POA: Diagnosis not present

## 2017-02-12 DIAGNOSIS — Z940 Kidney transplant status: Secondary | ICD-10-CM | POA: Insufficient documentation

## 2017-02-12 DIAGNOSIS — T82868A Thrombosis of vascular prosthetic devices, implants and grafts, initial encounter: Secondary | ICD-10-CM | POA: Diagnosis not present

## 2017-02-12 DIAGNOSIS — Z9889 Other specified postprocedural states: Secondary | ICD-10-CM | POA: Diagnosis not present

## 2017-02-12 DIAGNOSIS — Z992 Dependence on renal dialysis: Secondary | ICD-10-CM | POA: Insufficient documentation

## 2017-02-12 DIAGNOSIS — F17210 Nicotine dependence, cigarettes, uncomplicated: Secondary | ICD-10-CM | POA: Diagnosis not present

## 2017-02-12 DIAGNOSIS — D631 Anemia in chronic kidney disease: Secondary | ICD-10-CM | POA: Insufficient documentation

## 2017-02-12 DIAGNOSIS — Z8639 Personal history of other endocrine, nutritional and metabolic disease: Secondary | ICD-10-CM | POA: Insufficient documentation

## 2017-02-12 DIAGNOSIS — Z841 Family history of disorders of kidney and ureter: Secondary | ICD-10-CM | POA: Diagnosis not present

## 2017-02-12 DIAGNOSIS — Z8249 Family history of ischemic heart disease and other diseases of the circulatory system: Secondary | ICD-10-CM | POA: Diagnosis not present

## 2017-02-12 DIAGNOSIS — Y832 Surgical operation with anastomosis, bypass or graft as the cause of abnormal reaction of the patient, or of later complication, without mention of misadventure at the time of the procedure: Secondary | ICD-10-CM | POA: Diagnosis not present

## 2017-02-12 DIAGNOSIS — R51 Headache: Secondary | ICD-10-CM | POA: Insufficient documentation

## 2017-02-12 DIAGNOSIS — T82898A Other specified complication of vascular prosthetic devices, implants and grafts, initial encounter: Secondary | ICD-10-CM | POA: Diagnosis not present

## 2017-02-12 DIAGNOSIS — I120 Hypertensive chronic kidney disease with stage 5 chronic kidney disease or end stage renal disease: Secondary | ICD-10-CM | POA: Insufficient documentation

## 2017-02-12 DIAGNOSIS — N186 End stage renal disease: Secondary | ICD-10-CM | POA: Insufficient documentation

## 2017-02-12 DIAGNOSIS — Z833 Family history of diabetes mellitus: Secondary | ICD-10-CM | POA: Insufficient documentation

## 2017-02-12 HISTORY — PX: A/V FISTULAGRAM: CATH118298

## 2017-02-12 HISTORY — PX: A/V SHUNT INTERVENTION: CATH118220

## 2017-02-12 LAB — POTASSIUM (ARMC VASCULAR LAB ONLY): POTASSIUM (ARMC VASCULAR LAB): 4.8 (ref 3.5–5.1)

## 2017-02-12 SURGERY — A/V FISTULAGRAM
Anesthesia: Moderate Sedation

## 2017-02-12 MED ORDER — SODIUM CHLORIDE 0.9% FLUSH: 3.0000 mL | Freq: Two times a day (BID) | INTRAVENOUS | Status: DC

## 2017-02-12 MED ORDER — FENTANYL CITRATE (PF) 100 MCG/2ML IJ SOLN
INTRAMUSCULAR | Status: DC | PRN
  Administered 2017-02-12 (×2): 50 ug via INTRAVENOUS

## 2017-02-12 MED ORDER — CLINDAMYCIN PHOSPHATE 300 MG/50ML IV SOLN
INTRAVENOUS | Status: AC
  Filled 2017-02-12: qty 50

## 2017-02-12 MED ORDER — FENTANYL CITRATE (PF) 100 MCG/2ML IJ SOLN
INTRAMUSCULAR | Status: AC
  Filled 2017-02-12: qty 2

## 2017-02-12 MED ORDER — HEPARIN SODIUM (PORCINE) 1000 UNIT/ML IJ SOLN
INTRAMUSCULAR | Status: DC | PRN
  Administered 2017-02-12: 3000 [IU] via INTRAVENOUS

## 2017-02-12 MED ORDER — IOPAMIDOL (ISOVUE-300) INJECTION 61%
INTRAVENOUS | Status: DC | PRN
Start: 2017-02-12 — End: 2017-02-12
  Administered 2017-02-12: 40 mL via INTRA_ARTERIAL

## 2017-02-12 MED ORDER — HEPARIN SODIUM (PORCINE) 1000 UNIT/ML IJ SOLN
INTRAMUSCULAR | Status: AC
  Filled 2017-02-12: qty 1

## 2017-02-12 MED ORDER — SODIUM CHLORIDE 0.9 % IV SOLN
250.0000 mL | INTRAVENOUS | Status: DC | PRN
  Administered 2017-02-12: 250 mL via INTRAVENOUS

## 2017-02-12 MED ORDER — FAMOTIDINE IN NACL 20-0.9 MG/50ML-% IV SOLN
20.0000 mg | INTRAVENOUS | Status: DC
  Filled 2017-02-12: qty 50

## 2017-02-12 MED ORDER — METHYLPREDNISOLONE SODIUM SUCC 125 MG IJ SOLR: 125.0000 mg | INTRAMUSCULAR | Status: DC

## 2017-02-12 MED ORDER — LIDOCAINE-EPINEPHRINE (PF) 2 %-1:200000 IJ SOLN
INTRAMUSCULAR | Status: AC
  Filled 2017-02-12: qty 20

## 2017-02-12 MED ORDER — SODIUM CHLORIDE 0.9% FLUSH: 3.0000 mL | INTRAVENOUS | Status: DC | PRN

## 2017-02-12 MED ORDER — MIDAZOLAM HCL 2 MG/2ML IJ SOLN
INTRAMUSCULAR | Status: DC | PRN
  Administered 2017-02-12: 1 mg via INTRAVENOUS
  Administered 2017-02-12: 2 mg via INTRAVENOUS

## 2017-02-12 MED ORDER — DIPHENHYDRAMINE HCL 50 MG/ML IJ SOLN: 25.0000 mg | INTRAMUSCULAR | Status: DC

## 2017-02-12 MED ORDER — MIDAZOLAM HCL 5 MG/5ML IJ SOLN
INTRAMUSCULAR | Status: AC
  Filled 2017-02-12: qty 5

## 2017-02-12 SURGICAL SUPPLY — 16 items
BALLN DORADO 9X80X80 (BALLOONS) ×4
BALLN LUTONIX DCB 7X60X130 (BALLOONS) ×8
BALLOON DORADO 9X80X80 (BALLOONS) ×2 IMPLANT
BALLOON LUTONIX DCB 7X60X130 (BALLOONS) ×4 IMPLANT
CANNULA 5F STIFF (CANNULA) ×4 IMPLANT
COVER PROBE U/S 5X48 (MISCELLANEOUS) ×4 IMPLANT
DEVICE PRESTO INFLATION (MISCELLANEOUS) ×4 IMPLANT
DRAPE BRACHIAL (DRAPES) ×4 IMPLANT
PACK ANGIOGRAPHY (CUSTOM PROCEDURE TRAY) ×4 IMPLANT
SHEATH BRITE TIP 6FRX5.5 (SHEATH) ×4 IMPLANT
SHEATH BRITE TIP 7FRX5.5 (SHEATH) ×4 IMPLANT
STENT VIABAHN 8X100X120 (Permanent Stent) ×2 IMPLANT
STENT VIABAHN 8X10X120 (Permanent Stent) ×2 IMPLANT
SUT MNCRL AB 4-0 PS2 18 (SUTURE) ×4 IMPLANT
WIRE G V18X300CM (WIRE) ×4 IMPLANT
WIRE MAGIC TORQUE 260C (WIRE) ×4 IMPLANT

## 2017-02-12 NOTE — Op Note (Signed)
[facility]    OPERATIVE NOTE   PROCEDURE: 1.   Left brachiobasilic arteriovenous fistula cannulation under ultrasound guidance 2.   Left arm fistulagram including central venogram 3.   Percutaneous transluminal angioplasty of the left basilic vein/axillary vein confluence and left axillary vein with two 7 mm diameter by 6 cm length Lutonix drug-coated angioplasty balloons 4.   Viabahn stent placement to the left basilic vein/axillary vein confluence and left axillary vein with 8mm diameter by 10 cm length stent  PRE-OPERATIVE DIAGNOSIS: 1. ESRD 2. Poorly functional and aneurysmal left brachiobasilic AVF  POST-OPERATIVE DIAGNOSIS: same as above   SURGEON: Jason Dew, MD  ANESTHESIA: local with MCS  ESTIMATED BLOOD LOSS: 10 cc  FINDING(S): 1. 2 areas of stenosis within about an 8 or 9 cm range in the basilic vein/axillary vein confluence and then into the axillary vein. Both of the stenoses were greater than 75%  SPECIMEN(S):  None  CONTRAST: 40 cc  FLUORO TIME: 2.3 minutes  MODERATE CONSCIOUS SEDATION TIME: Approximately 15 minutes with 3 mg of Versed and 100 mcg of Fentanyl   INDICATIONS: Ruth Gutierrez is a 51 y.o. female who presents with malfunctioning left brachiobasilic arteriovenous fistula.  The patient is scheduled for left arm fistulagram.  The patient is aware the risks include but are not limited to: bleeding, infection, thrombosis of the cannulated access, and possible anaphylactic reaction to the contrast.  The patient is aware of the risks of the procedure and elects to proceed forward.  DESCRIPTION: After full informed written consent was obtained, the patient was brought back to the angiography suite and placed supine upon the angiography table.  The patient was connected to monitoring equipment. Moderate conscious sedation was administered with a face to face encounter with the patient throughout the procedure with my supervision of  the RN administering medicines and monitoring the patient's vital signs and mental status throughout from the start of the procedure until the patient was taken to the recovery room. The left arm was prepped and draped in the standard fashion for a percutaneous access intervention.  Under ultrasound guidance, the left brachiobasilic arteriovenous fistula was cannulated with a micropuncture needle under direct ultrasound guidance and a permanent image was performed.  The microwire was advanced into the fistula and the needle was exchanged for the a microsheath.  I then upsized to a 6 Fr Sheath and imaging was performed.  Hand injections were completed to image the access including the central venous system. This demonstrated 2 areas of stenosis within about an 8 or 9 cm range in the basilic vein/axillary vein confluence and then into the axillary vein. Both of the stenoses were greater than 75%.  Based on the images, this patient will need intervention to these areas. I then gave the patient 3000 units of intravenous heparin.  I then crossed the stenosis with a Magic Tourqe wire.  Based on the imaging, a 7 mm x 6 cm  Lutonix drug-coated angioplasty balloon was selected and two were required to cover both stenosis.  The balloon was centered around the basilic vein/axillary vein stenosis and inflated to 12 ATM for 1 minute(s).  The second balloon was placed into the axillary vein to treat the more proximal stenosis and inflated to 14 atm for 1 minute. On completion imaging, a greater than 50 % residual stenosis was present in both stenoses.  I then exchanged for a 7 French sheath and a 0.018 wire. An 8 mm diameter by   10 cm length Viabahn stent was then selected and this encompassed both lesions and was deployed. This was postdilated with a 9 mm balloon with less than 10% residual stenosis and brisk flow.   Based on the completion imaging, no further intervention is necessary.  The wire and balloon were removed  from the sheath.  A 4-0 Monocryl purse-string suture was sewn around the sheath.  The sheath was removed while tying down the suture.  A sterile bandage was applied to the puncture site.  COMPLICATIONS: None  CONDITION: Stable   Leotis Pain  02/12/2017 11:26 AM   This note was created with Dragon Medical transcription system. Any errors in dictation are purely unintentional.

## 2017-02-12 NOTE — H&P (Signed)
[facility] History & Physical Update  The patient was interviewed and re-examined.  The patient's previous History and Physical has been reviewed and is unchanged.  There is no change in the plan of care. We plan to proceed with the scheduled procedure.  Leotis Pain, MD  02/12/2017, 10:13 AM

## 2017-02-13 ENCOUNTER — Encounter: Payer: Self-pay | Admitting: Vascular Surgery

## 2017-02-13 DIAGNOSIS — N186 End stage renal disease: Secondary | ICD-10-CM | POA: Diagnosis not present

## 2017-02-13 DIAGNOSIS — E1129 Type 2 diabetes mellitus with other diabetic kidney complication: Secondary | ICD-10-CM | POA: Diagnosis not present

## 2017-02-13 DIAGNOSIS — N2581 Secondary hyperparathyroidism of renal origin: Secondary | ICD-10-CM | POA: Diagnosis not present

## 2017-02-13 DIAGNOSIS — D631 Anemia in chronic kidney disease: Secondary | ICD-10-CM | POA: Diagnosis not present

## 2017-02-16 DIAGNOSIS — E1129 Type 2 diabetes mellitus with other diabetic kidney complication: Secondary | ICD-10-CM | POA: Diagnosis not present

## 2017-02-16 DIAGNOSIS — N186 End stage renal disease: Secondary | ICD-10-CM | POA: Diagnosis not present

## 2017-02-16 DIAGNOSIS — D631 Anemia in chronic kidney disease: Secondary | ICD-10-CM | POA: Diagnosis not present

## 2017-02-16 DIAGNOSIS — N2581 Secondary hyperparathyroidism of renal origin: Secondary | ICD-10-CM | POA: Diagnosis not present

## 2017-02-18 DIAGNOSIS — D631 Anemia in chronic kidney disease: Secondary | ICD-10-CM | POA: Diagnosis not present

## 2017-02-18 DIAGNOSIS — N2581 Secondary hyperparathyroidism of renal origin: Secondary | ICD-10-CM | POA: Diagnosis not present

## 2017-02-18 DIAGNOSIS — E1129 Type 2 diabetes mellitus with other diabetic kidney complication: Secondary | ICD-10-CM | POA: Diagnosis not present

## 2017-02-18 DIAGNOSIS — N186 End stage renal disease: Secondary | ICD-10-CM | POA: Diagnosis not present

## 2017-02-20 DIAGNOSIS — N2581 Secondary hyperparathyroidism of renal origin: Secondary | ICD-10-CM | POA: Diagnosis not present

## 2017-02-20 DIAGNOSIS — N186 End stage renal disease: Secondary | ICD-10-CM | POA: Diagnosis not present

## 2017-02-20 DIAGNOSIS — E1129 Type 2 diabetes mellitus with other diabetic kidney complication: Secondary | ICD-10-CM | POA: Diagnosis not present

## 2017-02-20 DIAGNOSIS — D631 Anemia in chronic kidney disease: Secondary | ICD-10-CM | POA: Diagnosis not present

## 2017-02-23 DIAGNOSIS — N2581 Secondary hyperparathyroidism of renal origin: Secondary | ICD-10-CM | POA: Diagnosis not present

## 2017-02-23 DIAGNOSIS — D631 Anemia in chronic kidney disease: Secondary | ICD-10-CM | POA: Diagnosis not present

## 2017-02-23 DIAGNOSIS — E1129 Type 2 diabetes mellitus with other diabetic kidney complication: Secondary | ICD-10-CM | POA: Diagnosis not present

## 2017-02-23 DIAGNOSIS — N186 End stage renal disease: Secondary | ICD-10-CM | POA: Diagnosis not present

## 2017-02-25 ENCOUNTER — Encounter: Payer: Self-pay | Admitting: Registered Nurse

## 2017-02-25 DIAGNOSIS — D631 Anemia in chronic kidney disease: Secondary | ICD-10-CM | POA: Diagnosis not present

## 2017-02-25 DIAGNOSIS — E1129 Type 2 diabetes mellitus with other diabetic kidney complication: Secondary | ICD-10-CM | POA: Diagnosis not present

## 2017-02-25 DIAGNOSIS — N2581 Secondary hyperparathyroidism of renal origin: Secondary | ICD-10-CM | POA: Diagnosis not present

## 2017-02-25 DIAGNOSIS — N186 End stage renal disease: Secondary | ICD-10-CM | POA: Diagnosis not present

## 2017-02-27 DIAGNOSIS — N2581 Secondary hyperparathyroidism of renal origin: Secondary | ICD-10-CM | POA: Diagnosis not present

## 2017-02-27 DIAGNOSIS — N186 End stage renal disease: Secondary | ICD-10-CM | POA: Diagnosis not present

## 2017-02-27 DIAGNOSIS — D631 Anemia in chronic kidney disease: Secondary | ICD-10-CM | POA: Diagnosis not present

## 2017-02-27 DIAGNOSIS — E1129 Type 2 diabetes mellitus with other diabetic kidney complication: Secondary | ICD-10-CM | POA: Diagnosis not present

## 2017-03-02 DIAGNOSIS — N2581 Secondary hyperparathyroidism of renal origin: Secondary | ICD-10-CM | POA: Diagnosis not present

## 2017-03-02 DIAGNOSIS — D631 Anemia in chronic kidney disease: Secondary | ICD-10-CM | POA: Diagnosis not present

## 2017-03-02 DIAGNOSIS — N186 End stage renal disease: Secondary | ICD-10-CM | POA: Diagnosis not present

## 2017-03-02 DIAGNOSIS — E1129 Type 2 diabetes mellitus with other diabetic kidney complication: Secondary | ICD-10-CM | POA: Diagnosis not present

## 2017-03-03 DIAGNOSIS — Z992 Dependence on renal dialysis: Secondary | ICD-10-CM | POA: Diagnosis not present

## 2017-03-03 DIAGNOSIS — N186 End stage renal disease: Secondary | ICD-10-CM | POA: Diagnosis not present

## 2017-03-03 DIAGNOSIS — I129 Hypertensive chronic kidney disease with stage 1 through stage 4 chronic kidney disease, or unspecified chronic kidney disease: Secondary | ICD-10-CM | POA: Diagnosis not present

## 2017-03-04 DIAGNOSIS — Z23 Encounter for immunization: Secondary | ICD-10-CM | POA: Diagnosis not present

## 2017-03-04 DIAGNOSIS — E1129 Type 2 diabetes mellitus with other diabetic kidney complication: Secondary | ICD-10-CM | POA: Diagnosis not present

## 2017-03-04 DIAGNOSIS — N186 End stage renal disease: Secondary | ICD-10-CM | POA: Diagnosis not present

## 2017-03-04 DIAGNOSIS — N2581 Secondary hyperparathyroidism of renal origin: Secondary | ICD-10-CM | POA: Diagnosis not present

## 2017-03-06 DIAGNOSIS — Z23 Encounter for immunization: Secondary | ICD-10-CM | POA: Diagnosis not present

## 2017-03-06 DIAGNOSIS — N2581 Secondary hyperparathyroidism of renal origin: Secondary | ICD-10-CM | POA: Diagnosis not present

## 2017-03-06 DIAGNOSIS — E1129 Type 2 diabetes mellitus with other diabetic kidney complication: Secondary | ICD-10-CM | POA: Diagnosis not present

## 2017-03-06 DIAGNOSIS — N186 End stage renal disease: Secondary | ICD-10-CM | POA: Diagnosis not present

## 2017-03-09 DIAGNOSIS — E1129 Type 2 diabetes mellitus with other diabetic kidney complication: Secondary | ICD-10-CM | POA: Diagnosis not present

## 2017-03-09 DIAGNOSIS — Z23 Encounter for immunization: Secondary | ICD-10-CM | POA: Diagnosis not present

## 2017-03-09 DIAGNOSIS — N186 End stage renal disease: Secondary | ICD-10-CM | POA: Diagnosis not present

## 2017-03-09 DIAGNOSIS — N2581 Secondary hyperparathyroidism of renal origin: Secondary | ICD-10-CM | POA: Diagnosis not present

## 2017-03-11 DIAGNOSIS — E1129 Type 2 diabetes mellitus with other diabetic kidney complication: Secondary | ICD-10-CM | POA: Diagnosis not present

## 2017-03-11 DIAGNOSIS — Z23 Encounter for immunization: Secondary | ICD-10-CM | POA: Diagnosis not present

## 2017-03-11 DIAGNOSIS — N2581 Secondary hyperparathyroidism of renal origin: Secondary | ICD-10-CM | POA: Diagnosis not present

## 2017-03-11 DIAGNOSIS — N186 End stage renal disease: Secondary | ICD-10-CM | POA: Diagnosis not present

## 2017-03-13 DIAGNOSIS — N186 End stage renal disease: Secondary | ICD-10-CM | POA: Diagnosis not present

## 2017-03-13 DIAGNOSIS — Z23 Encounter for immunization: Secondary | ICD-10-CM | POA: Diagnosis not present

## 2017-03-13 DIAGNOSIS — N2581 Secondary hyperparathyroidism of renal origin: Secondary | ICD-10-CM | POA: Diagnosis not present

## 2017-03-13 DIAGNOSIS — E1129 Type 2 diabetes mellitus with other diabetic kidney complication: Secondary | ICD-10-CM | POA: Diagnosis not present

## 2017-03-16 DIAGNOSIS — Z23 Encounter for immunization: Secondary | ICD-10-CM | POA: Diagnosis not present

## 2017-03-16 DIAGNOSIS — N186 End stage renal disease: Secondary | ICD-10-CM | POA: Diagnosis not present

## 2017-03-16 DIAGNOSIS — E1129 Type 2 diabetes mellitus with other diabetic kidney complication: Secondary | ICD-10-CM | POA: Diagnosis not present

## 2017-03-16 DIAGNOSIS — N2581 Secondary hyperparathyroidism of renal origin: Secondary | ICD-10-CM | POA: Diagnosis not present

## 2017-03-18 DIAGNOSIS — N186 End stage renal disease: Secondary | ICD-10-CM | POA: Diagnosis not present

## 2017-03-18 DIAGNOSIS — Z23 Encounter for immunization: Secondary | ICD-10-CM | POA: Diagnosis not present

## 2017-03-18 DIAGNOSIS — E1129 Type 2 diabetes mellitus with other diabetic kidney complication: Secondary | ICD-10-CM | POA: Diagnosis not present

## 2017-03-18 DIAGNOSIS — N2581 Secondary hyperparathyroidism of renal origin: Secondary | ICD-10-CM | POA: Diagnosis not present

## 2017-03-20 DIAGNOSIS — E1129 Type 2 diabetes mellitus with other diabetic kidney complication: Secondary | ICD-10-CM | POA: Diagnosis not present

## 2017-03-20 DIAGNOSIS — N186 End stage renal disease: Secondary | ICD-10-CM | POA: Diagnosis not present

## 2017-03-20 DIAGNOSIS — N2581 Secondary hyperparathyroidism of renal origin: Secondary | ICD-10-CM | POA: Diagnosis not present

## 2017-03-20 DIAGNOSIS — Z23 Encounter for immunization: Secondary | ICD-10-CM | POA: Diagnosis not present

## 2017-03-23 DIAGNOSIS — N186 End stage renal disease: Secondary | ICD-10-CM | POA: Diagnosis not present

## 2017-03-23 DIAGNOSIS — N2581 Secondary hyperparathyroidism of renal origin: Secondary | ICD-10-CM | POA: Diagnosis not present

## 2017-03-23 DIAGNOSIS — E1129 Type 2 diabetes mellitus with other diabetic kidney complication: Secondary | ICD-10-CM | POA: Diagnosis not present

## 2017-03-23 DIAGNOSIS — Z23 Encounter for immunization: Secondary | ICD-10-CM | POA: Diagnosis not present

## 2017-03-25 DIAGNOSIS — N186 End stage renal disease: Secondary | ICD-10-CM | POA: Diagnosis not present

## 2017-03-25 DIAGNOSIS — N2581 Secondary hyperparathyroidism of renal origin: Secondary | ICD-10-CM | POA: Diagnosis not present

## 2017-03-25 DIAGNOSIS — Z23 Encounter for immunization: Secondary | ICD-10-CM | POA: Diagnosis not present

## 2017-03-25 DIAGNOSIS — E1129 Type 2 diabetes mellitus with other diabetic kidney complication: Secondary | ICD-10-CM | POA: Diagnosis not present

## 2017-03-27 DIAGNOSIS — E1129 Type 2 diabetes mellitus with other diabetic kidney complication: Secondary | ICD-10-CM | POA: Diagnosis not present

## 2017-03-27 DIAGNOSIS — N186 End stage renal disease: Secondary | ICD-10-CM | POA: Diagnosis not present

## 2017-03-27 DIAGNOSIS — N2581 Secondary hyperparathyroidism of renal origin: Secondary | ICD-10-CM | POA: Diagnosis not present

## 2017-03-27 DIAGNOSIS — Z23 Encounter for immunization: Secondary | ICD-10-CM | POA: Diagnosis not present

## 2017-03-30 DIAGNOSIS — N2581 Secondary hyperparathyroidism of renal origin: Secondary | ICD-10-CM | POA: Diagnosis not present

## 2017-03-30 DIAGNOSIS — Z23 Encounter for immunization: Secondary | ICD-10-CM | POA: Diagnosis not present

## 2017-03-30 DIAGNOSIS — E1129 Type 2 diabetes mellitus with other diabetic kidney complication: Secondary | ICD-10-CM | POA: Diagnosis not present

## 2017-03-30 DIAGNOSIS — N186 End stage renal disease: Secondary | ICD-10-CM | POA: Diagnosis not present

## 2017-03-31 ENCOUNTER — Encounter (INDEPENDENT_AMBULATORY_CARE_PROVIDER_SITE_OTHER): Payer: Medicare Other

## 2017-04-01 DIAGNOSIS — Z23 Encounter for immunization: Secondary | ICD-10-CM | POA: Diagnosis not present

## 2017-04-01 DIAGNOSIS — N186 End stage renal disease: Secondary | ICD-10-CM | POA: Diagnosis not present

## 2017-04-01 DIAGNOSIS — N2581 Secondary hyperparathyroidism of renal origin: Secondary | ICD-10-CM | POA: Diagnosis not present

## 2017-04-01 DIAGNOSIS — E1129 Type 2 diabetes mellitus with other diabetic kidney complication: Secondary | ICD-10-CM | POA: Diagnosis not present

## 2017-04-03 ENCOUNTER — Other Ambulatory Visit (INDEPENDENT_AMBULATORY_CARE_PROVIDER_SITE_OTHER): Payer: Self-pay | Admitting: Vascular Surgery

## 2017-04-03 DIAGNOSIS — T829XXD Unspecified complication of cardiac and vascular prosthetic device, implant and graft, subsequent encounter: Secondary | ICD-10-CM

## 2017-04-03 DIAGNOSIS — Z992 Dependence on renal dialysis: Secondary | ICD-10-CM | POA: Diagnosis not present

## 2017-04-03 DIAGNOSIS — N2581 Secondary hyperparathyroidism of renal origin: Secondary | ICD-10-CM | POA: Diagnosis not present

## 2017-04-03 DIAGNOSIS — N186 End stage renal disease: Secondary | ICD-10-CM

## 2017-04-03 DIAGNOSIS — E1129 Type 2 diabetes mellitus with other diabetic kidney complication: Secondary | ICD-10-CM | POA: Diagnosis not present

## 2017-04-03 DIAGNOSIS — I129 Hypertensive chronic kidney disease with stage 1 through stage 4 chronic kidney disease, or unspecified chronic kidney disease: Secondary | ICD-10-CM | POA: Diagnosis not present

## 2017-04-03 DIAGNOSIS — Z23 Encounter for immunization: Secondary | ICD-10-CM | POA: Diagnosis not present

## 2017-04-06 DIAGNOSIS — N186 End stage renal disease: Secondary | ICD-10-CM | POA: Diagnosis not present

## 2017-04-06 DIAGNOSIS — E1129 Type 2 diabetes mellitus with other diabetic kidney complication: Secondary | ICD-10-CM | POA: Diagnosis not present

## 2017-04-06 DIAGNOSIS — N2581 Secondary hyperparathyroidism of renal origin: Secondary | ICD-10-CM | POA: Diagnosis not present

## 2017-04-06 DIAGNOSIS — D631 Anemia in chronic kidney disease: Secondary | ICD-10-CM | POA: Diagnosis not present

## 2017-04-07 ENCOUNTER — Ambulatory Visit (INDEPENDENT_AMBULATORY_CARE_PROVIDER_SITE_OTHER): Payer: Medicare Other

## 2017-04-07 ENCOUNTER — Encounter (INDEPENDENT_AMBULATORY_CARE_PROVIDER_SITE_OTHER): Payer: Self-pay | Admitting: Vascular Surgery

## 2017-04-07 ENCOUNTER — Ambulatory Visit (INDEPENDENT_AMBULATORY_CARE_PROVIDER_SITE_OTHER): Payer: Medicare Other | Admitting: Vascular Surgery

## 2017-04-07 VITALS — BP 160/96 | HR 77 | Resp 16 | Ht 67.0 in | Wt 170.0 lb

## 2017-04-07 DIAGNOSIS — I10 Essential (primary) hypertension: Secondary | ICD-10-CM | POA: Diagnosis not present

## 2017-04-07 DIAGNOSIS — N186 End stage renal disease: Secondary | ICD-10-CM

## 2017-04-07 DIAGNOSIS — T829XXD Unspecified complication of cardiac and vascular prosthetic device, implant and graft, subsequent encounter: Secondary | ICD-10-CM | POA: Diagnosis not present

## 2017-04-07 DIAGNOSIS — Z992 Dependence on renal dialysis: Secondary | ICD-10-CM

## 2017-04-07 NOTE — Patient Instructions (Signed)
Vascular Access for Hemodialysis A vascular access is a connection between two blood vessels that allows blood to be easily removed from the body and returned to the body during hemodialysis. Hemodialysis is a procedure in which a machine outside of the body filters the blood. There are three types of vascular accesses:  Arteriovenous fistula. This is a connection between an artery and a vein (usually in the arm) that is made by sewing them together. Blood in the artery flows directly into the vein, causing it to get larger over time. This makes it easier for the vein to be used for hemodialysis. An arteriovenous fistula takes 1-6 months to develop after surgery.  Arteriovenous graft. This is a connection between an artery and a vein in the arm that is made with a tube. An arteriovenous graft can be used within 2-3 weeks of surgery.  Venous catheter. This is a thin, flexible tube that is placed in a large vein (usually in the neck, chest, or groin). A venous catheter for hemodialysis contains two tubes that come out of the skin. A venous catheter can be used right away. It is usually used as a temporary access if you need hemodialysis before a fistula or graft has developed. It may also be used as a permanent access if a fistula or graft cannot be created.  Which type of access is best for me? The type of access that is best for you depends on the size and strength of your veins. A fistula is usually the preferred type of access. It can last several years and is less likely than the other types of accesses to become infected or to cause blood clots within a blood vessel (thrombosis). However, a fistula is not an option for everyone. If your veins are not the right size, a graft may be used instead. Grafts require you to have strong veins. If your veins are not strong enough for a graft, a catheter may be used. Catheters are more likely than fistulas and grafts to become infected or to have  thrombosis. Sometimes, only one type of access is an option. Your health care provider will help you determine which type of access is best for you. How is a vascular access used? The way the access is used depends on the type of access:  If the access is a fistula or graft, two needles are inserted through the skin into the access before each hemodialysis session. Blood leaves the body through one of the needles and travels through a tube to the hemodialysis machine (dialyzer). It then flows through another tube and returns to the body through the second needle.  If the access is a catheter, one tube is connected directly to the tube that leads to the dialyzer and the other is connected to a tube that leads away from the dialyzer. Blood leaves the body through one tube and returns to the body through the other.  What kind of problems can occur with vascular accesses?  Blood clots within a blood vessel (thrombosis). Thrombosis can lead to a narrowing of a blood vessel or tube (stenosis). If thrombosis occurs frequently, another access site may be created as a backup.  Infection. These problems are most likely to occur with a venous catheter and least likely to occur with an arteriovenous fistula. How do I care for my vascular access? Wear a medical alert bracelet. This tells health care providers that you are a dialysis patient in the case of an emergency and   allows them to care for your veins appropriately. If you have a graft or fistula:  A "bruit" is a noise that is heard with a stethoscope and a "thrill" is a vibration felt over the graft or fistula. The presence of the bruit and thrill indicates that the access is working. You will be taught to feel for the thrill each day. If this is not felt, the access may be clotted. Call your health care provider.  You may use the arm where your vascular access is located freely after the site heals. Keep the following in mind: ? Avoid pressure on the  arm. ? Avoid lifting heavy objects with the arm. ? Avoid sleeping on the arm. ? Avoid wearing tight-sleeved shirts or jewelry around the graft or fistula.  Do not allow blood pressure monitoring or needle punctures on the side where the graft or fistula is located.  With permission from your health care provider, you may do exercises to help with blood flow through a fistula. These exercises involve squeezing a rubber ball or other soft objects as instructed.  Contact a health care provider if:  Chills develop.  You have an oral temperature above 102 F (38.9 C).  Swelling around the graft or fistula gets worse.  New pain develops.  Pus or other fluid (drainage) is seen at the vascular access site.  Skin redness or red streaking is seen on the skin around, above, or below the vascular access. Get help right away if:  Pain, numbness, or an unusual pale skin color develops in the hand on the side of your fistula.  Dizziness or weakness develops that you have not had before.  The vascular access has bleeding that cannot be easily controlled. This information is not intended to replace advice given to you by your health care provider. Make sure you discuss any questions you have with your health care provider. Document Released: 10/11/2002 Document Revised: 12/27/2015 Document Reviewed: 12/07/2012 Elsevier Interactive Patient Education  2017 [employer]  

## 2017-04-07 NOTE — Assessment & Plan Note (Signed)
Her duplex today shows a patent left brachiobasilic AVF with a patent stent. This is doing well.  I will plan to see her back in six months with repeat duplex unless problems develop in the interim.

## 2017-04-07 NOTE — Progress Notes (Signed)
MRN : [id number]  Ruth Gutierrez is a 51 y.o. (1966/04/29) female who presents with chief complaint of  Chief Complaint  Patient presents with  . Follow-up    6 wk HDA  .  History of Present Illness: Patient returns today in follow up of dialysis access.  She is about 6 weeks s/p intervention to her left Brachiobasilic AVF.  It has been working well and has had no issues since the procedure.   Her duplex today shows a patent left brachiobasilic AVF with a patent stent.    Current Outpatient Prescriptions  Medication Sig Dispense Refill  . aspirin 81 MG tablet Take 81 mg by mouth daily.     Marland Kitchen atenolol (TENORMIN) 50 MG tablet Take 50 mg by mouth 2 (two) times daily.     . calcitRIOL (ROCALTROL) 0.5 MCG capsule Take 1 capsule (0.5 mcg total) by mouth every Monday, Wednesday, and Friday with hemodialysis.    Marland Kitchen calcium acetate (PHOSLO) 667 MG capsule Take 1,334-3,335 mg by mouth 3 (three) times daily with meals. Tales 5 caps with each meal, 2 caps with snacks    . diphenhydrAMINE (BENADRYL) 25 MG tablet Take 25 mg by mouth every 8 (eight) hours as needed (for cough).    . ferric citrate (AURYXIA) 1 GM 210 MG(Fe) tablet Take 420 mg by mouth 3 (three) times daily with meals.    Marland Kitchen lisinopril (PRINIVIL,ZESTRIL) 40 MG tablet Take 40 mg by mouth daily.    Marland Kitchen amLODipine (NORVASC) 10 MG tablet Take 10 mg by mouth daily.     . norethindrone (AYGESTIN) 5 MG tablet Take 5 mg by mouth 2 (two) times daily.   11   No current facility-administered medications for this visit.     Past Medical History:  Diagnosis Date  . Anemia of chronic disease   . Arthritis   . Deceased-donor kidney transplant    Performed at [facility], April 2010.  Initial ESRD due to HTN nephropathy  . Eczema   . ESRD (end stage renal disease) ([place])    s/p transplant creatinine baseline 1.1  M/W/F dialysis  . FUO (fever of unknown origin) 05/17/2015  . GERD (gastroesophageal reflux disease)   . Headache(784.0)   .  History of hyperparathyroidism   . Hypertension   . Shortness of breath   . Wears glasses     Past Surgical History:  Procedure Laterality Date  . A/V FISTULAGRAM Left 02/12/2017   Procedure: A/V Fistulagram;  Surgeon: Algernon Huxley, MD;  Location: [place] [facility];  Service: Cardiovascular;  Laterality: Left;  . A/V SHUNT INTERVENTION N/A 02/12/2017   Procedure: A/V Shunt Intervention;  Surgeon: Algernon Huxley, MD;  Location: [place] [facility];  Service: Cardiovascular;  Laterality: N/A;  . AV FISTULA PLACEMENT    . BASCILIC VEIN TRANSPOSITION Left 10/30/2014   Procedure: LEFT BASCILIC VEIN TRANSPOSITION;  Surgeon: Rosetta Posner, MD;  Location: [place];  Service: Vascular;  Laterality: Left;  . BASCILIC VEIN TRANSPOSITION Left 01/03/2015   Procedure: LEFT ARM 2ND STAGE BASCILIC VEIN TRANSPOSITION;  Surgeon: Rosetta Posner, MD;  Location: [place];  Service: Vascular;  Laterality: Left;  . FRACTURE SURGERY     left foot,baby toe nad next toe missing  . INSERTION OF DIALYSIS CATHETER Right 10/30/2014   Procedure: INSERTION OF DIALYSIS CATHETER;  Surgeon: Rosetta Posner, MD;  Location: [place];  Service: Vascular;  Laterality: Right;  . KIDNEY TRANSPLANT  11/2008   [facility]  . WISDOM TOOTH EXTRACTION       Social History       Social History  Substance Use Topics  . Smoking status: Current Every Day Smoker    Packs/day: 0.50    Years: 27.00    Types: Cigarettes  . Smokeless tobacco: Never Used     Comment: 10 cigarettes a day  . Alcohol use No     Comment: occasional drinker noted in the past    Family History       Family History  Problem Relation Age of Onset  . Hypertension Mother   . Hypertension Father   . Diabetes Brother   . Deep vein thrombosis Brother   . Hypertension Sister   . Hyperlipidemia Sister   . Kidney disease Brother     on HD        Allergies  Allergen Reactions  . Penicillin G Itching and Rash  .  Penicillins Itching and Rash     REVIEW OF SYSTEMS(Negative unless checked)  Constitutional: [] Weight loss[] Fever[] Chills Cardiac:[] Chest pain[] Chest pressure[] Palpitations [] Shortness of breath when laying flat [] Shortness of breath at rest [] Shortness of breath with exertion. Vascular: [] Pain in legs with walking[] Pain in legsat rest[] Pain in legs when laying flat [] Claudication [] Pain in feet when walking [] Pain in feet at rest [] Pain in feet when laying flat [] History of DVT [] Phlebitis [] Swelling in legs [] Varicose veins [x] Non-healing ulcers Pulmonary: [] Uses home oxygen [] Productive cough[] Hemoptysis [] Wheeze [] COPD [] Asthma Neurologic: [] Dizziness [] Blackouts [] Seizures [] History of stroke [] History of TIA[] Aphasia [] Temporary blindness[] Dysphagia [] Weaknessor numbness in arms [] Weakness or numbnessin legs Musculoskeletal: [] Arthritis [] Joint swelling [] Joint pain [] Low back pain Hematologic:[] Easy bruising[x] Easy bleeding [] Hypercoagulable state [] Anemic  Gastrointestinal:[] Blood in stool[] Vomiting blood[] Gastroesophageal reflux/heartburn[] Abdominal pain Genitourinary: [x] Chronic kidney disease [] Difficulturination [] Frequenturination [] Burning with urination[] Hematuria Skin: [] Rashes [x] Ulcers [x] Wounds Psychological: [] History of anxiety[] History of major depression.     Physical Examination  BP (!) 160/96   Pulse 77   Resp 16   Ht 5\' 7"  (1.702 m)   Wt 77.1 kg (170 lb)   LMP 06/02/2015   BMI 26.63 kg/m  Gen:  WD/WN, NAD Head: [place]/AT, No temporalis wasting. Ear/Nose/Throat: Hearing grossly intact, nares w/o erythema or drainage, trachea midline Eyes: Conjunctiva clear. Sclera non-icteric Neck: Supple.  No JVD.  Pulmonary:  Good air movement, no use of accessory muscles.  Cardiac: RRR, normal S1, S2 Vascular: good thrill in left arm AVF Vessel  Right Left  Radial Palpable Palpable                                    Musculoskeletal: M/S 5/5 throughout.  No deformity or atrophy. Neurologic: Sensation grossly intact in extremities.  Symmetrical.  Speech is fluent.  Psychiatric: Judgment intact, Mood & affect appropriate for pt's clinical situation. Dermatologic: No rashes or ulcers noted.  No cellulitis or open wounds.       Labs Recent Results (from the past 2160 hour(s))  Potassium [facility] vascular lab only)     Status: None   Collection Time: 02/12/17 10:00 AM  Result Value Ref Range   Potassium [facility] vascular lab) 4.8 3.5 - 5.1    Radiology No results found.   Assessment/Plan Hypertension blood pressure control important in reducing the progression of atherosclerotic disease. On appropriate oral medications.  ESRD on dialysis [facility]) Her duplex today shows a patent left brachiobasilic AVF with a patent stent. This is doing well.  I will plan to see her back  in six months with repeat duplex unless problems develop in the interim.    Leotis Pain, MD  04/07/2017 5:11 PM    This note was created with Dragon medical transcription system.  Any errors from dictation are purely unintentional

## 2017-04-08 DIAGNOSIS — N2581 Secondary hyperparathyroidism of renal origin: Secondary | ICD-10-CM | POA: Diagnosis not present

## 2017-04-08 DIAGNOSIS — E1129 Type 2 diabetes mellitus with other diabetic kidney complication: Secondary | ICD-10-CM | POA: Diagnosis not present

## 2017-04-08 DIAGNOSIS — N186 End stage renal disease: Secondary | ICD-10-CM | POA: Diagnosis not present

## 2017-04-08 DIAGNOSIS — D631 Anemia in chronic kidney disease: Secondary | ICD-10-CM | POA: Diagnosis not present

## 2017-04-10 DIAGNOSIS — N2581 Secondary hyperparathyroidism of renal origin: Secondary | ICD-10-CM | POA: Diagnosis not present

## 2017-04-10 DIAGNOSIS — N186 End stage renal disease: Secondary | ICD-10-CM | POA: Diagnosis not present

## 2017-04-10 DIAGNOSIS — E1129 Type 2 diabetes mellitus with other diabetic kidney complication: Secondary | ICD-10-CM | POA: Diagnosis not present

## 2017-04-10 DIAGNOSIS — D631 Anemia in chronic kidney disease: Secondary | ICD-10-CM | POA: Diagnosis not present

## 2017-04-13 DIAGNOSIS — N2581 Secondary hyperparathyroidism of renal origin: Secondary | ICD-10-CM | POA: Diagnosis not present

## 2017-04-13 DIAGNOSIS — D631 Anemia in chronic kidney disease: Secondary | ICD-10-CM | POA: Diagnosis not present

## 2017-04-13 DIAGNOSIS — E1129 Type 2 diabetes mellitus with other diabetic kidney complication: Secondary | ICD-10-CM | POA: Diagnosis not present

## 2017-04-13 DIAGNOSIS — N186 End stage renal disease: Secondary | ICD-10-CM | POA: Diagnosis not present

## 2017-04-15 DIAGNOSIS — N186 End stage renal disease: Secondary | ICD-10-CM | POA: Diagnosis not present

## 2017-04-15 DIAGNOSIS — E1129 Type 2 diabetes mellitus with other diabetic kidney complication: Secondary | ICD-10-CM | POA: Diagnosis not present

## 2017-04-15 DIAGNOSIS — D631 Anemia in chronic kidney disease: Secondary | ICD-10-CM | POA: Diagnosis not present

## 2017-04-15 DIAGNOSIS — N2581 Secondary hyperparathyroidism of renal origin: Secondary | ICD-10-CM | POA: Diagnosis not present

## 2017-04-17 DIAGNOSIS — N186 End stage renal disease: Secondary | ICD-10-CM | POA: Diagnosis not present

## 2017-04-17 DIAGNOSIS — E1129 Type 2 diabetes mellitus with other diabetic kidney complication: Secondary | ICD-10-CM | POA: Diagnosis not present

## 2017-04-17 DIAGNOSIS — D631 Anemia in chronic kidney disease: Secondary | ICD-10-CM | POA: Diagnosis not present

## 2017-04-17 DIAGNOSIS — N2581 Secondary hyperparathyroidism of renal origin: Secondary | ICD-10-CM | POA: Diagnosis not present

## 2017-04-20 DIAGNOSIS — E1129 Type 2 diabetes mellitus with other diabetic kidney complication: Secondary | ICD-10-CM | POA: Diagnosis not present

## 2017-04-20 DIAGNOSIS — N2581 Secondary hyperparathyroidism of renal origin: Secondary | ICD-10-CM | POA: Diagnosis not present

## 2017-04-20 DIAGNOSIS — N186 End stage renal disease: Secondary | ICD-10-CM | POA: Diagnosis not present

## 2017-04-20 DIAGNOSIS — D631 Anemia in chronic kidney disease: Secondary | ICD-10-CM | POA: Diagnosis not present

## 2017-04-22 DIAGNOSIS — D631 Anemia in chronic kidney disease: Secondary | ICD-10-CM | POA: Diagnosis not present

## 2017-04-22 DIAGNOSIS — N186 End stage renal disease: Secondary | ICD-10-CM | POA: Diagnosis not present

## 2017-04-22 DIAGNOSIS — E1129 Type 2 diabetes mellitus with other diabetic kidney complication: Secondary | ICD-10-CM | POA: Diagnosis not present

## 2017-04-22 DIAGNOSIS — N2581 Secondary hyperparathyroidism of renal origin: Secondary | ICD-10-CM | POA: Diagnosis not present

## 2017-04-24 DIAGNOSIS — N2581 Secondary hyperparathyroidism of renal origin: Secondary | ICD-10-CM | POA: Diagnosis not present

## 2017-04-24 DIAGNOSIS — E1129 Type 2 diabetes mellitus with other diabetic kidney complication: Secondary | ICD-10-CM | POA: Diagnosis not present

## 2017-04-24 DIAGNOSIS — D631 Anemia in chronic kidney disease: Secondary | ICD-10-CM | POA: Diagnosis not present

## 2017-04-24 DIAGNOSIS — N186 End stage renal disease: Secondary | ICD-10-CM | POA: Diagnosis not present

## 2017-04-27 DIAGNOSIS — N186 End stage renal disease: Secondary | ICD-10-CM | POA: Diagnosis not present

## 2017-04-27 DIAGNOSIS — N2581 Secondary hyperparathyroidism of renal origin: Secondary | ICD-10-CM | POA: Diagnosis not present

## 2017-04-27 DIAGNOSIS — D631 Anemia in chronic kidney disease: Secondary | ICD-10-CM | POA: Diagnosis not present

## 2017-04-27 DIAGNOSIS — E1129 Type 2 diabetes mellitus with other diabetic kidney complication: Secondary | ICD-10-CM | POA: Diagnosis not present

## 2017-04-29 DIAGNOSIS — D631 Anemia in chronic kidney disease: Secondary | ICD-10-CM | POA: Diagnosis not present

## 2017-04-29 DIAGNOSIS — N2581 Secondary hyperparathyroidism of renal origin: Secondary | ICD-10-CM | POA: Diagnosis not present

## 2017-04-29 DIAGNOSIS — N186 End stage renal disease: Secondary | ICD-10-CM | POA: Diagnosis not present

## 2017-04-29 DIAGNOSIS — E1129 Type 2 diabetes mellitus with other diabetic kidney complication: Secondary | ICD-10-CM | POA: Diagnosis not present

## 2017-05-01 DIAGNOSIS — N186 End stage renal disease: Secondary | ICD-10-CM | POA: Diagnosis not present

## 2017-05-01 DIAGNOSIS — E1129 Type 2 diabetes mellitus with other diabetic kidney complication: Secondary | ICD-10-CM | POA: Diagnosis not present

## 2017-05-01 DIAGNOSIS — N2581 Secondary hyperparathyroidism of renal origin: Secondary | ICD-10-CM | POA: Diagnosis not present

## 2017-05-01 DIAGNOSIS — D631 Anemia in chronic kidney disease: Secondary | ICD-10-CM | POA: Diagnosis not present

## 2017-05-03 DIAGNOSIS — Z992 Dependence on renal dialysis: Secondary | ICD-10-CM | POA: Diagnosis not present

## 2017-05-03 DIAGNOSIS — N186 End stage renal disease: Secondary | ICD-10-CM | POA: Diagnosis not present

## 2017-05-03 DIAGNOSIS — I129 Hypertensive chronic kidney disease with stage 1 through stage 4 chronic kidney disease, or unspecified chronic kidney disease: Secondary | ICD-10-CM | POA: Diagnosis not present

## 2017-05-04 DIAGNOSIS — N2581 Secondary hyperparathyroidism of renal origin: Secondary | ICD-10-CM | POA: Diagnosis not present

## 2017-05-04 DIAGNOSIS — D631 Anemia in chronic kidney disease: Secondary | ICD-10-CM | POA: Diagnosis not present

## 2017-05-04 DIAGNOSIS — E1129 Type 2 diabetes mellitus with other diabetic kidney complication: Secondary | ICD-10-CM | POA: Diagnosis not present

## 2017-05-04 DIAGNOSIS — N186 End stage renal disease: Secondary | ICD-10-CM | POA: Diagnosis not present

## 2017-05-06 DIAGNOSIS — D631 Anemia in chronic kidney disease: Secondary | ICD-10-CM | POA: Diagnosis not present

## 2017-05-06 DIAGNOSIS — E1129 Type 2 diabetes mellitus with other diabetic kidney complication: Secondary | ICD-10-CM | POA: Diagnosis not present

## 2017-05-06 DIAGNOSIS — N2581 Secondary hyperparathyroidism of renal origin: Secondary | ICD-10-CM | POA: Diagnosis not present

## 2017-05-06 DIAGNOSIS — N186 End stage renal disease: Secondary | ICD-10-CM | POA: Diagnosis not present

## 2017-05-08 DIAGNOSIS — D631 Anemia in chronic kidney disease: Secondary | ICD-10-CM | POA: Diagnosis not present

## 2017-05-08 DIAGNOSIS — N2581 Secondary hyperparathyroidism of renal origin: Secondary | ICD-10-CM | POA: Diagnosis not present

## 2017-05-08 DIAGNOSIS — E1129 Type 2 diabetes mellitus with other diabetic kidney complication: Secondary | ICD-10-CM | POA: Diagnosis not present

## 2017-05-08 DIAGNOSIS — N186 End stage renal disease: Secondary | ICD-10-CM | POA: Diagnosis not present

## 2017-05-11 DIAGNOSIS — N186 End stage renal disease: Secondary | ICD-10-CM | POA: Diagnosis not present

## 2017-05-11 DIAGNOSIS — D631 Anemia in chronic kidney disease: Secondary | ICD-10-CM | POA: Diagnosis not present

## 2017-05-11 DIAGNOSIS — N2581 Secondary hyperparathyroidism of renal origin: Secondary | ICD-10-CM | POA: Diagnosis not present

## 2017-05-11 DIAGNOSIS — E1129 Type 2 diabetes mellitus with other diabetic kidney complication: Secondary | ICD-10-CM | POA: Diagnosis not present

## 2017-05-13 DIAGNOSIS — N186 End stage renal disease: Secondary | ICD-10-CM | POA: Diagnosis not present

## 2017-05-13 DIAGNOSIS — N2581 Secondary hyperparathyroidism of renal origin: Secondary | ICD-10-CM | POA: Diagnosis not present

## 2017-05-13 DIAGNOSIS — D631 Anemia in chronic kidney disease: Secondary | ICD-10-CM | POA: Diagnosis not present

## 2017-05-13 DIAGNOSIS — E1129 Type 2 diabetes mellitus with other diabetic kidney complication: Secondary | ICD-10-CM | POA: Diagnosis not present

## 2017-05-15 DIAGNOSIS — E1129 Type 2 diabetes mellitus with other diabetic kidney complication: Secondary | ICD-10-CM | POA: Diagnosis not present

## 2017-05-15 DIAGNOSIS — D631 Anemia in chronic kidney disease: Secondary | ICD-10-CM | POA: Diagnosis not present

## 2017-05-15 DIAGNOSIS — N2581 Secondary hyperparathyroidism of renal origin: Secondary | ICD-10-CM | POA: Diagnosis not present

## 2017-05-15 DIAGNOSIS — N186 End stage renal disease: Secondary | ICD-10-CM | POA: Diagnosis not present

## 2017-05-18 DIAGNOSIS — D631 Anemia in chronic kidney disease: Secondary | ICD-10-CM | POA: Diagnosis not present

## 2017-05-18 DIAGNOSIS — N186 End stage renal disease: Secondary | ICD-10-CM | POA: Diagnosis not present

## 2017-05-18 DIAGNOSIS — N2581 Secondary hyperparathyroidism of renal origin: Secondary | ICD-10-CM | POA: Diagnosis not present

## 2017-05-18 DIAGNOSIS — E1129 Type 2 diabetes mellitus with other diabetic kidney complication: Secondary | ICD-10-CM | POA: Diagnosis not present

## 2017-05-20 DIAGNOSIS — E1129 Type 2 diabetes mellitus with other diabetic kidney complication: Secondary | ICD-10-CM | POA: Diagnosis not present

## 2017-05-20 DIAGNOSIS — N186 End stage renal disease: Secondary | ICD-10-CM | POA: Diagnosis not present

## 2017-05-20 DIAGNOSIS — N2581 Secondary hyperparathyroidism of renal origin: Secondary | ICD-10-CM | POA: Diagnosis not present

## 2017-05-20 DIAGNOSIS — D631 Anemia in chronic kidney disease: Secondary | ICD-10-CM | POA: Diagnosis not present

## 2017-05-22 DIAGNOSIS — D631 Anemia in chronic kidney disease: Secondary | ICD-10-CM | POA: Diagnosis not present

## 2017-05-22 DIAGNOSIS — N2581 Secondary hyperparathyroidism of renal origin: Secondary | ICD-10-CM | POA: Diagnosis not present

## 2017-05-22 DIAGNOSIS — E1129 Type 2 diabetes mellitus with other diabetic kidney complication: Secondary | ICD-10-CM | POA: Diagnosis not present

## 2017-05-22 DIAGNOSIS — N186 End stage renal disease: Secondary | ICD-10-CM | POA: Diagnosis not present

## 2017-05-25 DIAGNOSIS — N186 End stage renal disease: Secondary | ICD-10-CM | POA: Diagnosis not present

## 2017-05-25 DIAGNOSIS — N2581 Secondary hyperparathyroidism of renal origin: Secondary | ICD-10-CM | POA: Diagnosis not present

## 2017-05-25 DIAGNOSIS — D631 Anemia in chronic kidney disease: Secondary | ICD-10-CM | POA: Diagnosis not present

## 2017-05-25 DIAGNOSIS — E1129 Type 2 diabetes mellitus with other diabetic kidney complication: Secondary | ICD-10-CM | POA: Diagnosis not present

## 2017-05-27 DIAGNOSIS — E1129 Type 2 diabetes mellitus with other diabetic kidney complication: Secondary | ICD-10-CM | POA: Diagnosis not present

## 2017-05-27 DIAGNOSIS — N2581 Secondary hyperparathyroidism of renal origin: Secondary | ICD-10-CM | POA: Diagnosis not present

## 2017-05-27 DIAGNOSIS — D631 Anemia in chronic kidney disease: Secondary | ICD-10-CM | POA: Diagnosis not present

## 2017-05-27 DIAGNOSIS — N186 End stage renal disease: Secondary | ICD-10-CM | POA: Diagnosis not present

## 2017-05-29 DIAGNOSIS — E1129 Type 2 diabetes mellitus with other diabetic kidney complication: Secondary | ICD-10-CM | POA: Diagnosis not present

## 2017-05-29 DIAGNOSIS — N186 End stage renal disease: Secondary | ICD-10-CM | POA: Diagnosis not present

## 2017-05-29 DIAGNOSIS — D631 Anemia in chronic kidney disease: Secondary | ICD-10-CM | POA: Diagnosis not present

## 2017-05-29 DIAGNOSIS — N2581 Secondary hyperparathyroidism of renal origin: Secondary | ICD-10-CM | POA: Diagnosis not present

## 2017-06-01 DIAGNOSIS — N186 End stage renal disease: Secondary | ICD-10-CM | POA: Diagnosis not present

## 2017-06-01 DIAGNOSIS — E1129 Type 2 diabetes mellitus with other diabetic kidney complication: Secondary | ICD-10-CM | POA: Diagnosis not present

## 2017-06-01 DIAGNOSIS — N2581 Secondary hyperparathyroidism of renal origin: Secondary | ICD-10-CM | POA: Diagnosis not present

## 2017-06-01 DIAGNOSIS — D631 Anemia in chronic kidney disease: Secondary | ICD-10-CM | POA: Diagnosis not present

## 2017-06-03 DIAGNOSIS — I129 Hypertensive chronic kidney disease with stage 1 through stage 4 chronic kidney disease, or unspecified chronic kidney disease: Secondary | ICD-10-CM | POA: Diagnosis not present

## 2017-06-03 DIAGNOSIS — D631 Anemia in chronic kidney disease: Secondary | ICD-10-CM | POA: Diagnosis not present

## 2017-06-03 DIAGNOSIS — N2581 Secondary hyperparathyroidism of renal origin: Secondary | ICD-10-CM | POA: Diagnosis not present

## 2017-06-03 DIAGNOSIS — N186 End stage renal disease: Secondary | ICD-10-CM | POA: Diagnosis not present

## 2017-06-03 DIAGNOSIS — Z992 Dependence on renal dialysis: Secondary | ICD-10-CM | POA: Diagnosis not present

## 2017-06-03 DIAGNOSIS — E1129 Type 2 diabetes mellitus with other diabetic kidney complication: Secondary | ICD-10-CM | POA: Diagnosis not present

## 2017-06-05 DIAGNOSIS — E1129 Type 2 diabetes mellitus with other diabetic kidney complication: Secondary | ICD-10-CM | POA: Diagnosis not present

## 2017-06-05 DIAGNOSIS — Z23 Encounter for immunization: Secondary | ICD-10-CM | POA: Diagnosis not present

## 2017-06-05 DIAGNOSIS — D631 Anemia in chronic kidney disease: Secondary | ICD-10-CM | POA: Diagnosis not present

## 2017-06-05 DIAGNOSIS — N186 End stage renal disease: Secondary | ICD-10-CM | POA: Diagnosis not present

## 2017-06-05 DIAGNOSIS — N2581 Secondary hyperparathyroidism of renal origin: Secondary | ICD-10-CM | POA: Diagnosis not present

## 2017-06-08 DIAGNOSIS — N186 End stage renal disease: Secondary | ICD-10-CM | POA: Diagnosis not present

## 2017-06-08 DIAGNOSIS — D631 Anemia in chronic kidney disease: Secondary | ICD-10-CM | POA: Diagnosis not present

## 2017-06-08 DIAGNOSIS — N2581 Secondary hyperparathyroidism of renal origin: Secondary | ICD-10-CM | POA: Diagnosis not present

## 2017-06-08 DIAGNOSIS — Z23 Encounter for immunization: Secondary | ICD-10-CM | POA: Diagnosis not present

## 2017-06-08 DIAGNOSIS — E1129 Type 2 diabetes mellitus with other diabetic kidney complication: Secondary | ICD-10-CM | POA: Diagnosis not present

## 2017-06-10 DIAGNOSIS — N186 End stage renal disease: Secondary | ICD-10-CM | POA: Diagnosis not present

## 2017-06-10 DIAGNOSIS — D631 Anemia in chronic kidney disease: Secondary | ICD-10-CM | POA: Diagnosis not present

## 2017-06-10 DIAGNOSIS — Z23 Encounter for immunization: Secondary | ICD-10-CM | POA: Diagnosis not present

## 2017-06-10 DIAGNOSIS — E1129 Type 2 diabetes mellitus with other diabetic kidney complication: Secondary | ICD-10-CM | POA: Diagnosis not present

## 2017-06-10 DIAGNOSIS — N2581 Secondary hyperparathyroidism of renal origin: Secondary | ICD-10-CM | POA: Diagnosis not present

## 2017-06-12 DIAGNOSIS — D631 Anemia in chronic kidney disease: Secondary | ICD-10-CM | POA: Diagnosis not present

## 2017-06-12 DIAGNOSIS — N186 End stage renal disease: Secondary | ICD-10-CM | POA: Diagnosis not present

## 2017-06-12 DIAGNOSIS — E1129 Type 2 diabetes mellitus with other diabetic kidney complication: Secondary | ICD-10-CM | POA: Diagnosis not present

## 2017-06-12 DIAGNOSIS — N2581 Secondary hyperparathyroidism of renal origin: Secondary | ICD-10-CM | POA: Diagnosis not present

## 2017-06-12 DIAGNOSIS — Z23 Encounter for immunization: Secondary | ICD-10-CM | POA: Diagnosis not present

## 2017-06-15 DIAGNOSIS — Z23 Encounter for immunization: Secondary | ICD-10-CM | POA: Diagnosis not present

## 2017-06-15 DIAGNOSIS — E1129 Type 2 diabetes mellitus with other diabetic kidney complication: Secondary | ICD-10-CM | POA: Diagnosis not present

## 2017-06-15 DIAGNOSIS — D631 Anemia in chronic kidney disease: Secondary | ICD-10-CM | POA: Diagnosis not present

## 2017-06-15 DIAGNOSIS — N186 End stage renal disease: Secondary | ICD-10-CM | POA: Diagnosis not present

## 2017-06-15 DIAGNOSIS — N2581 Secondary hyperparathyroidism of renal origin: Secondary | ICD-10-CM | POA: Diagnosis not present

## 2017-06-17 DIAGNOSIS — Z23 Encounter for immunization: Secondary | ICD-10-CM | POA: Diagnosis not present

## 2017-06-17 DIAGNOSIS — E1129 Type 2 diabetes mellitus with other diabetic kidney complication: Secondary | ICD-10-CM | POA: Diagnosis not present

## 2017-06-17 DIAGNOSIS — N186 End stage renal disease: Secondary | ICD-10-CM | POA: Diagnosis not present

## 2017-06-17 DIAGNOSIS — N2581 Secondary hyperparathyroidism of renal origin: Secondary | ICD-10-CM | POA: Diagnosis not present

## 2017-06-17 DIAGNOSIS — D631 Anemia in chronic kidney disease: Secondary | ICD-10-CM | POA: Diagnosis not present

## 2017-06-19 DIAGNOSIS — E1129 Type 2 diabetes mellitus with other diabetic kidney complication: Secondary | ICD-10-CM | POA: Diagnosis not present

## 2017-06-19 DIAGNOSIS — N186 End stage renal disease: Secondary | ICD-10-CM | POA: Diagnosis not present

## 2017-06-19 DIAGNOSIS — Z23 Encounter for immunization: Secondary | ICD-10-CM | POA: Diagnosis not present

## 2017-06-19 DIAGNOSIS — D631 Anemia in chronic kidney disease: Secondary | ICD-10-CM | POA: Diagnosis not present

## 2017-06-19 DIAGNOSIS — N2581 Secondary hyperparathyroidism of renal origin: Secondary | ICD-10-CM | POA: Diagnosis not present

## 2017-06-21 DIAGNOSIS — N2581 Secondary hyperparathyroidism of renal origin: Secondary | ICD-10-CM | POA: Diagnosis not present

## 2017-06-21 DIAGNOSIS — N186 End stage renal disease: Secondary | ICD-10-CM | POA: Diagnosis not present

## 2017-06-21 DIAGNOSIS — E1129 Type 2 diabetes mellitus with other diabetic kidney complication: Secondary | ICD-10-CM | POA: Diagnosis not present

## 2017-06-21 DIAGNOSIS — D631 Anemia in chronic kidney disease: Secondary | ICD-10-CM | POA: Diagnosis not present

## 2017-06-21 DIAGNOSIS — Z23 Encounter for immunization: Secondary | ICD-10-CM | POA: Diagnosis not present

## 2017-06-23 DIAGNOSIS — Z23 Encounter for immunization: Secondary | ICD-10-CM | POA: Diagnosis not present

## 2017-06-23 DIAGNOSIS — E1129 Type 2 diabetes mellitus with other diabetic kidney complication: Secondary | ICD-10-CM | POA: Diagnosis not present

## 2017-06-23 DIAGNOSIS — N186 End stage renal disease: Secondary | ICD-10-CM | POA: Diagnosis not present

## 2017-06-23 DIAGNOSIS — D631 Anemia in chronic kidney disease: Secondary | ICD-10-CM | POA: Diagnosis not present

## 2017-06-23 DIAGNOSIS — N2581 Secondary hyperparathyroidism of renal origin: Secondary | ICD-10-CM | POA: Diagnosis not present

## 2017-06-26 DIAGNOSIS — Z23 Encounter for immunization: Secondary | ICD-10-CM | POA: Diagnosis not present

## 2017-06-26 DIAGNOSIS — E1129 Type 2 diabetes mellitus with other diabetic kidney complication: Secondary | ICD-10-CM | POA: Diagnosis not present

## 2017-06-26 DIAGNOSIS — N2581 Secondary hyperparathyroidism of renal origin: Secondary | ICD-10-CM | POA: Diagnosis not present

## 2017-06-26 DIAGNOSIS — N186 End stage renal disease: Secondary | ICD-10-CM | POA: Diagnosis not present

## 2017-06-26 DIAGNOSIS — D631 Anemia in chronic kidney disease: Secondary | ICD-10-CM | POA: Diagnosis not present

## 2017-06-29 DIAGNOSIS — N186 End stage renal disease: Secondary | ICD-10-CM | POA: Diagnosis not present

## 2017-06-29 DIAGNOSIS — Z23 Encounter for immunization: Secondary | ICD-10-CM | POA: Diagnosis not present

## 2017-06-29 DIAGNOSIS — E1129 Type 2 diabetes mellitus with other diabetic kidney complication: Secondary | ICD-10-CM | POA: Diagnosis not present

## 2017-06-29 DIAGNOSIS — N2581 Secondary hyperparathyroidism of renal origin: Secondary | ICD-10-CM | POA: Diagnosis not present

## 2017-06-29 DIAGNOSIS — D631 Anemia in chronic kidney disease: Secondary | ICD-10-CM | POA: Diagnosis not present

## 2017-07-01 DIAGNOSIS — Z23 Encounter for immunization: Secondary | ICD-10-CM | POA: Diagnosis not present

## 2017-07-01 DIAGNOSIS — N2581 Secondary hyperparathyroidism of renal origin: Secondary | ICD-10-CM | POA: Diagnosis not present

## 2017-07-01 DIAGNOSIS — N186 End stage renal disease: Secondary | ICD-10-CM | POA: Diagnosis not present

## 2017-07-01 DIAGNOSIS — D631 Anemia in chronic kidney disease: Secondary | ICD-10-CM | POA: Diagnosis not present

## 2017-07-01 DIAGNOSIS — E1129 Type 2 diabetes mellitus with other diabetic kidney complication: Secondary | ICD-10-CM | POA: Diagnosis not present

## 2017-07-03 DIAGNOSIS — I129 Hypertensive chronic kidney disease with stage 1 through stage 4 chronic kidney disease, or unspecified chronic kidney disease: Secondary | ICD-10-CM | POA: Diagnosis not present

## 2017-07-03 DIAGNOSIS — E1129 Type 2 diabetes mellitus with other diabetic kidney complication: Secondary | ICD-10-CM | POA: Diagnosis not present

## 2017-07-03 DIAGNOSIS — N2581 Secondary hyperparathyroidism of renal origin: Secondary | ICD-10-CM | POA: Diagnosis not present

## 2017-07-03 DIAGNOSIS — Z23 Encounter for immunization: Secondary | ICD-10-CM | POA: Diagnosis not present

## 2017-07-03 DIAGNOSIS — Z992 Dependence on renal dialysis: Secondary | ICD-10-CM | POA: Diagnosis not present

## 2017-07-03 DIAGNOSIS — N186 End stage renal disease: Secondary | ICD-10-CM | POA: Diagnosis not present

## 2017-07-03 DIAGNOSIS — D631 Anemia in chronic kidney disease: Secondary | ICD-10-CM | POA: Diagnosis not present

## 2017-07-06 DIAGNOSIS — N186 End stage renal disease: Secondary | ICD-10-CM | POA: Diagnosis not present

## 2017-07-06 DIAGNOSIS — E1129 Type 2 diabetes mellitus with other diabetic kidney complication: Secondary | ICD-10-CM | POA: Diagnosis not present

## 2017-07-06 DIAGNOSIS — N2581 Secondary hyperparathyroidism of renal origin: Secondary | ICD-10-CM | POA: Diagnosis not present

## 2017-07-08 DIAGNOSIS — E1129 Type 2 diabetes mellitus with other diabetic kidney complication: Secondary | ICD-10-CM | POA: Diagnosis not present

## 2017-07-08 DIAGNOSIS — N186 End stage renal disease: Secondary | ICD-10-CM | POA: Diagnosis not present

## 2017-07-08 DIAGNOSIS — N2581 Secondary hyperparathyroidism of renal origin: Secondary | ICD-10-CM | POA: Diagnosis not present

## 2017-07-10 DIAGNOSIS — E1129 Type 2 diabetes mellitus with other diabetic kidney complication: Secondary | ICD-10-CM | POA: Diagnosis not present

## 2017-07-10 DIAGNOSIS — N186 End stage renal disease: Secondary | ICD-10-CM | POA: Diagnosis not present

## 2017-07-10 DIAGNOSIS — N2581 Secondary hyperparathyroidism of renal origin: Secondary | ICD-10-CM | POA: Diagnosis not present

## 2017-07-13 DIAGNOSIS — E1129 Type 2 diabetes mellitus with other diabetic kidney complication: Secondary | ICD-10-CM | POA: Diagnosis not present

## 2017-07-13 DIAGNOSIS — N2581 Secondary hyperparathyroidism of renal origin: Secondary | ICD-10-CM | POA: Diagnosis not present

## 2017-07-13 DIAGNOSIS — N186 End stage renal disease: Secondary | ICD-10-CM | POA: Diagnosis not present

## 2017-07-15 DIAGNOSIS — N186 End stage renal disease: Secondary | ICD-10-CM | POA: Diagnosis not present

## 2017-07-15 DIAGNOSIS — N2581 Secondary hyperparathyroidism of renal origin: Secondary | ICD-10-CM | POA: Diagnosis not present

## 2017-07-15 DIAGNOSIS — E1129 Type 2 diabetes mellitus with other diabetic kidney complication: Secondary | ICD-10-CM | POA: Diagnosis not present

## 2017-07-17 DIAGNOSIS — N186 End stage renal disease: Secondary | ICD-10-CM | POA: Diagnosis not present

## 2017-07-17 DIAGNOSIS — E1129 Type 2 diabetes mellitus with other diabetic kidney complication: Secondary | ICD-10-CM | POA: Diagnosis not present

## 2017-07-17 DIAGNOSIS — N2581 Secondary hyperparathyroidism of renal origin: Secondary | ICD-10-CM | POA: Diagnosis not present

## 2017-07-20 DIAGNOSIS — N2581 Secondary hyperparathyroidism of renal origin: Secondary | ICD-10-CM | POA: Diagnosis not present

## 2017-07-20 DIAGNOSIS — N186 End stage renal disease: Secondary | ICD-10-CM | POA: Diagnosis not present

## 2017-07-20 DIAGNOSIS — E1129 Type 2 diabetes mellitus with other diabetic kidney complication: Secondary | ICD-10-CM | POA: Diagnosis not present

## 2017-07-22 DIAGNOSIS — N186 End stage renal disease: Secondary | ICD-10-CM | POA: Diagnosis not present

## 2017-07-22 DIAGNOSIS — E1129 Type 2 diabetes mellitus with other diabetic kidney complication: Secondary | ICD-10-CM | POA: Diagnosis not present

## 2017-07-22 DIAGNOSIS — N2581 Secondary hyperparathyroidism of renal origin: Secondary | ICD-10-CM | POA: Diagnosis not present

## 2017-07-24 DIAGNOSIS — N2581 Secondary hyperparathyroidism of renal origin: Secondary | ICD-10-CM | POA: Diagnosis not present

## 2017-07-24 DIAGNOSIS — E1129 Type 2 diabetes mellitus with other diabetic kidney complication: Secondary | ICD-10-CM | POA: Diagnosis not present

## 2017-07-24 DIAGNOSIS — N186 End stage renal disease: Secondary | ICD-10-CM | POA: Diagnosis not present

## 2017-07-26 DIAGNOSIS — N2581 Secondary hyperparathyroidism of renal origin: Secondary | ICD-10-CM | POA: Diagnosis not present

## 2017-07-26 DIAGNOSIS — E1129 Type 2 diabetes mellitus with other diabetic kidney complication: Secondary | ICD-10-CM | POA: Diagnosis not present

## 2017-07-26 DIAGNOSIS — N186 End stage renal disease: Secondary | ICD-10-CM | POA: Diagnosis not present

## 2017-07-29 DIAGNOSIS — N186 End stage renal disease: Secondary | ICD-10-CM | POA: Diagnosis not present

## 2017-07-29 DIAGNOSIS — E1129 Type 2 diabetes mellitus with other diabetic kidney complication: Secondary | ICD-10-CM | POA: Diagnosis not present

## 2017-07-29 DIAGNOSIS — N2581 Secondary hyperparathyroidism of renal origin: Secondary | ICD-10-CM | POA: Diagnosis not present

## 2017-07-31 DIAGNOSIS — N186 End stage renal disease: Secondary | ICD-10-CM | POA: Diagnosis not present

## 2017-07-31 DIAGNOSIS — E1129 Type 2 diabetes mellitus with other diabetic kidney complication: Secondary | ICD-10-CM | POA: Diagnosis not present

## 2017-07-31 DIAGNOSIS — N2581 Secondary hyperparathyroidism of renal origin: Secondary | ICD-10-CM | POA: Diagnosis not present

## 2017-08-02 DIAGNOSIS — E1129 Type 2 diabetes mellitus with other diabetic kidney complication: Secondary | ICD-10-CM | POA: Diagnosis not present

## 2017-08-02 DIAGNOSIS — N186 End stage renal disease: Secondary | ICD-10-CM | POA: Diagnosis not present

## 2017-08-02 DIAGNOSIS — N2581 Secondary hyperparathyroidism of renal origin: Secondary | ICD-10-CM | POA: Diagnosis not present

## 2017-08-03 DIAGNOSIS — I129 Hypertensive chronic kidney disease with stage 1 through stage 4 chronic kidney disease, or unspecified chronic kidney disease: Secondary | ICD-10-CM | POA: Diagnosis not present

## 2017-08-03 DIAGNOSIS — Z992 Dependence on renal dialysis: Secondary | ICD-10-CM | POA: Diagnosis not present

## 2017-08-03 DIAGNOSIS — N186 End stage renal disease: Secondary | ICD-10-CM | POA: Diagnosis not present

## 2017-08-05 DIAGNOSIS — N2581 Secondary hyperparathyroidism of renal origin: Secondary | ICD-10-CM | POA: Diagnosis not present

## 2017-08-05 DIAGNOSIS — E1129 Type 2 diabetes mellitus with other diabetic kidney complication: Secondary | ICD-10-CM | POA: Diagnosis not present

## 2017-08-05 DIAGNOSIS — N186 End stage renal disease: Secondary | ICD-10-CM | POA: Diagnosis not present

## 2017-08-07 DIAGNOSIS — N2581 Secondary hyperparathyroidism of renal origin: Secondary | ICD-10-CM | POA: Diagnosis not present

## 2017-08-07 DIAGNOSIS — N186 End stage renal disease: Secondary | ICD-10-CM | POA: Diagnosis not present

## 2017-08-07 DIAGNOSIS — E1129 Type 2 diabetes mellitus with other diabetic kidney complication: Secondary | ICD-10-CM | POA: Diagnosis not present

## 2017-08-10 DIAGNOSIS — N2581 Secondary hyperparathyroidism of renal origin: Secondary | ICD-10-CM | POA: Diagnosis not present

## 2017-08-10 DIAGNOSIS — E1129 Type 2 diabetes mellitus with other diabetic kidney complication: Secondary | ICD-10-CM | POA: Diagnosis not present

## 2017-08-10 DIAGNOSIS — N186 End stage renal disease: Secondary | ICD-10-CM | POA: Diagnosis not present

## 2017-08-12 DIAGNOSIS — N2581 Secondary hyperparathyroidism of renal origin: Secondary | ICD-10-CM | POA: Diagnosis not present

## 2017-08-12 DIAGNOSIS — N186 End stage renal disease: Secondary | ICD-10-CM | POA: Diagnosis not present

## 2017-08-12 DIAGNOSIS — E1129 Type 2 diabetes mellitus with other diabetic kidney complication: Secondary | ICD-10-CM | POA: Diagnosis not present

## 2017-08-14 DIAGNOSIS — E1129 Type 2 diabetes mellitus with other diabetic kidney complication: Secondary | ICD-10-CM | POA: Diagnosis not present

## 2017-08-14 DIAGNOSIS — N2581 Secondary hyperparathyroidism of renal origin: Secondary | ICD-10-CM | POA: Diagnosis not present

## 2017-08-14 DIAGNOSIS — N186 End stage renal disease: Secondary | ICD-10-CM | POA: Diagnosis not present

## 2017-08-17 DIAGNOSIS — E1129 Type 2 diabetes mellitus with other diabetic kidney complication: Secondary | ICD-10-CM | POA: Diagnosis not present

## 2017-08-17 DIAGNOSIS — N2581 Secondary hyperparathyroidism of renal origin: Secondary | ICD-10-CM | POA: Diagnosis not present

## 2017-08-17 DIAGNOSIS — N186 End stage renal disease: Secondary | ICD-10-CM | POA: Diagnosis not present

## 2017-08-19 DIAGNOSIS — N186 End stage renal disease: Secondary | ICD-10-CM | POA: Diagnosis not present

## 2017-08-19 DIAGNOSIS — N2581 Secondary hyperparathyroidism of renal origin: Secondary | ICD-10-CM | POA: Diagnosis not present

## 2017-08-19 DIAGNOSIS — E1129 Type 2 diabetes mellitus with other diabetic kidney complication: Secondary | ICD-10-CM | POA: Diagnosis not present

## 2017-08-21 DIAGNOSIS — E1129 Type 2 diabetes mellitus with other diabetic kidney complication: Secondary | ICD-10-CM | POA: Diagnosis not present

## 2017-08-21 DIAGNOSIS — N2581 Secondary hyperparathyroidism of renal origin: Secondary | ICD-10-CM | POA: Diagnosis not present

## 2017-08-21 DIAGNOSIS — N186 End stage renal disease: Secondary | ICD-10-CM | POA: Diagnosis not present

## 2017-08-24 DIAGNOSIS — E1129 Type 2 diabetes mellitus with other diabetic kidney complication: Secondary | ICD-10-CM | POA: Diagnosis not present

## 2017-08-24 DIAGNOSIS — N2581 Secondary hyperparathyroidism of renal origin: Secondary | ICD-10-CM | POA: Diagnosis not present

## 2017-08-24 DIAGNOSIS — N186 End stage renal disease: Secondary | ICD-10-CM | POA: Diagnosis not present

## 2017-08-26 DIAGNOSIS — N2581 Secondary hyperparathyroidism of renal origin: Secondary | ICD-10-CM | POA: Diagnosis not present

## 2017-08-26 DIAGNOSIS — N186 End stage renal disease: Secondary | ICD-10-CM | POA: Diagnosis not present

## 2017-08-26 DIAGNOSIS — E1129 Type 2 diabetes mellitus with other diabetic kidney complication: Secondary | ICD-10-CM | POA: Diagnosis not present

## 2017-08-28 DIAGNOSIS — E1129 Type 2 diabetes mellitus with other diabetic kidney complication: Secondary | ICD-10-CM | POA: Diagnosis not present

## 2017-08-28 DIAGNOSIS — N2581 Secondary hyperparathyroidism of renal origin: Secondary | ICD-10-CM | POA: Diagnosis not present

## 2017-08-28 DIAGNOSIS — N186 End stage renal disease: Secondary | ICD-10-CM | POA: Diagnosis not present

## 2017-08-31 DIAGNOSIS — E1129 Type 2 diabetes mellitus with other diabetic kidney complication: Secondary | ICD-10-CM | POA: Diagnosis not present

## 2017-08-31 DIAGNOSIS — N186 End stage renal disease: Secondary | ICD-10-CM | POA: Diagnosis not present

## 2017-08-31 DIAGNOSIS — N2581 Secondary hyperparathyroidism of renal origin: Secondary | ICD-10-CM | POA: Diagnosis not present

## 2017-09-02 DIAGNOSIS — E1129 Type 2 diabetes mellitus with other diabetic kidney complication: Secondary | ICD-10-CM | POA: Diagnosis not present

## 2017-09-02 DIAGNOSIS — N2581 Secondary hyperparathyroidism of renal origin: Secondary | ICD-10-CM | POA: Diagnosis not present

## 2017-09-02 DIAGNOSIS — N186 End stage renal disease: Secondary | ICD-10-CM | POA: Diagnosis not present

## 2017-09-03 DIAGNOSIS — N186 End stage renal disease: Secondary | ICD-10-CM | POA: Diagnosis not present

## 2017-09-03 DIAGNOSIS — I129 Hypertensive chronic kidney disease with stage 1 through stage 4 chronic kidney disease, or unspecified chronic kidney disease: Secondary | ICD-10-CM | POA: Diagnosis not present

## 2017-09-03 DIAGNOSIS — Z992 Dependence on renal dialysis: Secondary | ICD-10-CM | POA: Diagnosis not present

## 2017-09-04 DIAGNOSIS — I129 Hypertensive chronic kidney disease with stage 1 through stage 4 chronic kidney disease, or unspecified chronic kidney disease: Secondary | ICD-10-CM | POA: Diagnosis not present

## 2017-09-04 DIAGNOSIS — N186 End stage renal disease: Secondary | ICD-10-CM | POA: Diagnosis not present

## 2017-09-04 DIAGNOSIS — Z992 Dependence on renal dialysis: Secondary | ICD-10-CM | POA: Diagnosis not present

## 2017-09-04 DIAGNOSIS — N2581 Secondary hyperparathyroidism of renal origin: Secondary | ICD-10-CM | POA: Diagnosis not present

## 2017-09-04 DIAGNOSIS — E1129 Type 2 diabetes mellitus with other diabetic kidney complication: Secondary | ICD-10-CM | POA: Diagnosis not present

## 2017-09-07 DIAGNOSIS — E1129 Type 2 diabetes mellitus with other diabetic kidney complication: Secondary | ICD-10-CM | POA: Diagnosis not present

## 2017-09-07 DIAGNOSIS — N186 End stage renal disease: Secondary | ICD-10-CM | POA: Diagnosis not present

## 2017-09-07 DIAGNOSIS — N2581 Secondary hyperparathyroidism of renal origin: Secondary | ICD-10-CM | POA: Diagnosis not present

## 2017-09-09 DIAGNOSIS — N2581 Secondary hyperparathyroidism of renal origin: Secondary | ICD-10-CM | POA: Diagnosis not present

## 2017-09-09 DIAGNOSIS — N186 End stage renal disease: Secondary | ICD-10-CM | POA: Diagnosis not present

## 2017-09-09 DIAGNOSIS — E1129 Type 2 diabetes mellitus with other diabetic kidney complication: Secondary | ICD-10-CM | POA: Diagnosis not present

## 2017-09-11 DIAGNOSIS — E1129 Type 2 diabetes mellitus with other diabetic kidney complication: Secondary | ICD-10-CM | POA: Diagnosis not present

## 2017-09-11 DIAGNOSIS — N186 End stage renal disease: Secondary | ICD-10-CM | POA: Diagnosis not present

## 2017-09-11 DIAGNOSIS — N2581 Secondary hyperparathyroidism of renal origin: Secondary | ICD-10-CM | POA: Diagnosis not present

## 2017-09-14 DIAGNOSIS — N2581 Secondary hyperparathyroidism of renal origin: Secondary | ICD-10-CM | POA: Diagnosis not present

## 2017-09-14 DIAGNOSIS — N186 End stage renal disease: Secondary | ICD-10-CM | POA: Diagnosis not present

## 2017-09-14 DIAGNOSIS — E1129 Type 2 diabetes mellitus with other diabetic kidney complication: Secondary | ICD-10-CM | POA: Diagnosis not present

## 2017-09-16 DIAGNOSIS — N2581 Secondary hyperparathyroidism of renal origin: Secondary | ICD-10-CM | POA: Diagnosis not present

## 2017-09-16 DIAGNOSIS — E1129 Type 2 diabetes mellitus with other diabetic kidney complication: Secondary | ICD-10-CM | POA: Diagnosis not present

## 2017-09-16 DIAGNOSIS — N186 End stage renal disease: Secondary | ICD-10-CM | POA: Diagnosis not present

## 2017-09-18 DIAGNOSIS — N186 End stage renal disease: Secondary | ICD-10-CM | POA: Diagnosis not present

## 2017-09-18 DIAGNOSIS — E1129 Type 2 diabetes mellitus with other diabetic kidney complication: Secondary | ICD-10-CM | POA: Diagnosis not present

## 2017-09-18 DIAGNOSIS — N2581 Secondary hyperparathyroidism of renal origin: Secondary | ICD-10-CM | POA: Diagnosis not present

## 2017-09-21 DIAGNOSIS — N2581 Secondary hyperparathyroidism of renal origin: Secondary | ICD-10-CM | POA: Diagnosis not present

## 2017-09-21 DIAGNOSIS — E1129 Type 2 diabetes mellitus with other diabetic kidney complication: Secondary | ICD-10-CM | POA: Diagnosis not present

## 2017-09-21 DIAGNOSIS — N186 End stage renal disease: Secondary | ICD-10-CM | POA: Diagnosis not present

## 2017-09-23 DIAGNOSIS — E1129 Type 2 diabetes mellitus with other diabetic kidney complication: Secondary | ICD-10-CM | POA: Diagnosis not present

## 2017-09-23 DIAGNOSIS — N2581 Secondary hyperparathyroidism of renal origin: Secondary | ICD-10-CM | POA: Diagnosis not present

## 2017-09-23 DIAGNOSIS — N186 End stage renal disease: Secondary | ICD-10-CM | POA: Diagnosis not present

## 2017-09-25 DIAGNOSIS — E1129 Type 2 diabetes mellitus with other diabetic kidney complication: Secondary | ICD-10-CM | POA: Diagnosis not present

## 2017-09-25 DIAGNOSIS — N186 End stage renal disease: Secondary | ICD-10-CM | POA: Diagnosis not present

## 2017-09-25 DIAGNOSIS — N2581 Secondary hyperparathyroidism of renal origin: Secondary | ICD-10-CM | POA: Diagnosis not present

## 2017-09-28 DIAGNOSIS — N2581 Secondary hyperparathyroidism of renal origin: Secondary | ICD-10-CM | POA: Diagnosis not present

## 2017-09-28 DIAGNOSIS — E1129 Type 2 diabetes mellitus with other diabetic kidney complication: Secondary | ICD-10-CM | POA: Diagnosis not present

## 2017-09-28 DIAGNOSIS — N186 End stage renal disease: Secondary | ICD-10-CM | POA: Diagnosis not present

## 2017-09-30 DIAGNOSIS — N2581 Secondary hyperparathyroidism of renal origin: Secondary | ICD-10-CM | POA: Diagnosis not present

## 2017-09-30 DIAGNOSIS — N186 End stage renal disease: Secondary | ICD-10-CM | POA: Diagnosis not present

## 2017-09-30 DIAGNOSIS — E1129 Type 2 diabetes mellitus with other diabetic kidney complication: Secondary | ICD-10-CM | POA: Diagnosis not present

## 2017-10-02 DIAGNOSIS — N186 End stage renal disease: Secondary | ICD-10-CM | POA: Diagnosis not present

## 2017-10-02 DIAGNOSIS — I129 Hypertensive chronic kidney disease with stage 1 through stage 4 chronic kidney disease, or unspecified chronic kidney disease: Secondary | ICD-10-CM | POA: Diagnosis not present

## 2017-10-02 DIAGNOSIS — N2581 Secondary hyperparathyroidism of renal origin: Secondary | ICD-10-CM | POA: Diagnosis not present

## 2017-10-02 DIAGNOSIS — Z992 Dependence on renal dialysis: Secondary | ICD-10-CM | POA: Diagnosis not present

## 2017-10-02 DIAGNOSIS — E1129 Type 2 diabetes mellitus with other diabetic kidney complication: Secondary | ICD-10-CM | POA: Diagnosis not present

## 2017-10-05 DIAGNOSIS — N186 End stage renal disease: Secondary | ICD-10-CM | POA: Diagnosis not present

## 2017-10-05 DIAGNOSIS — E1129 Type 2 diabetes mellitus with other diabetic kidney complication: Secondary | ICD-10-CM | POA: Diagnosis not present

## 2017-10-05 DIAGNOSIS — N2581 Secondary hyperparathyroidism of renal origin: Secondary | ICD-10-CM | POA: Diagnosis not present

## 2017-10-07 DIAGNOSIS — N2581 Secondary hyperparathyroidism of renal origin: Secondary | ICD-10-CM | POA: Diagnosis not present

## 2017-10-07 DIAGNOSIS — E1129 Type 2 diabetes mellitus with other diabetic kidney complication: Secondary | ICD-10-CM | POA: Diagnosis not present

## 2017-10-07 DIAGNOSIS — N186 End stage renal disease: Secondary | ICD-10-CM | POA: Diagnosis not present

## 2017-10-09 DIAGNOSIS — E1129 Type 2 diabetes mellitus with other diabetic kidney complication: Secondary | ICD-10-CM | POA: Diagnosis not present

## 2017-10-09 DIAGNOSIS — N186 End stage renal disease: Secondary | ICD-10-CM | POA: Diagnosis not present

## 2017-10-09 DIAGNOSIS — N2581 Secondary hyperparathyroidism of renal origin: Secondary | ICD-10-CM | POA: Diagnosis not present

## 2017-10-12 DIAGNOSIS — N186 End stage renal disease: Secondary | ICD-10-CM | POA: Diagnosis not present

## 2017-10-12 DIAGNOSIS — E1129 Type 2 diabetes mellitus with other diabetic kidney complication: Secondary | ICD-10-CM | POA: Diagnosis not present

## 2017-10-12 DIAGNOSIS — N2581 Secondary hyperparathyroidism of renal origin: Secondary | ICD-10-CM | POA: Diagnosis not present

## 2017-10-13 ENCOUNTER — Ambulatory Visit (INDEPENDENT_AMBULATORY_CARE_PROVIDER_SITE_OTHER): Payer: Medicare Other

## 2017-10-13 ENCOUNTER — Encounter (INDEPENDENT_AMBULATORY_CARE_PROVIDER_SITE_OTHER): Payer: Self-pay | Admitting: Vascular Surgery

## 2017-10-13 ENCOUNTER — Ambulatory Visit (INDEPENDENT_AMBULATORY_CARE_PROVIDER_SITE_OTHER): Payer: Medicare Other | Admitting: Vascular Surgery

## 2017-10-13 VITALS — BP 128/88 | HR 96 | Resp 15 | Ht 66.0 in | Wt 167.0 lb

## 2017-10-13 DIAGNOSIS — Z992 Dependence on renal dialysis: Secondary | ICD-10-CM

## 2017-10-13 DIAGNOSIS — N186 End stage renal disease: Secondary | ICD-10-CM

## 2017-10-13 DIAGNOSIS — I10 Essential (primary) hypertension: Secondary | ICD-10-CM | POA: Diagnosis not present

## 2017-10-13 NOTE — Progress Notes (Signed)
MRN : [id number]  Ruth Gutierrez is a 52 y.o. ([birth year]) female who presents with chief complaint of  Chief Complaint  Patient presents with  . Follow-up    6 month HDA  .  History of Present Illness: Patient returns today in follow up of her dialysis access.  This is been working well without any major issues since her last visit.  Her access sites do have some thinning skin, but otherwise the fistula is doing well without any open ulcerations or bleeding.  No fevers or chills.  No difficulty with access or prolonged bleeding.  Her duplex today shows a widely patent left brachiobasilic AV fistula with the previous stent placement being widely patent as well.  Current Outpatient Prescriptions  Medication Sig Dispense Refill  . aspirin 81 MG tablet Take 81 mg by mouth daily.     Marland Kitchen atenolol (TENORMIN) 50 MG tablet Take 50 mg by mouth 2 (two) times daily.     . calcitRIOL (ROCALTROL) 0.5 MCG capsule Take 1 capsule (0.5 mcg total) by mouth every Monday, Wednesday, and Friday with hemodialysis.    Marland Kitchen calcium acetate (PHOSLO) 667 MG capsule Take 1,334-3,335 mg by mouth 3 (three) times daily with meals. Tales 5 caps with each meal, 2 caps with snacks    . diphenhydrAMINE (BENADRYL) 25 MG tablet Take 25 mg by mouth every 8 (eight) hours as needed (for cough).    . ferric citrate (AURYXIA) 1 GM 210 MG(Fe) tablet Take 420 mg by mouth 3 (three) times daily with meals.    Marland Kitchen lisinopril (PRINIVIL,ZESTRIL) 40 MG tablet Take 40 mg by mouth daily.    Marland Kitchen amLODipine (NORVASC) 10 MG tablet Take 10 mg by mouth daily.     . norethindrone (AYGESTIN) 5 MG tablet Take 5 mg by mouth 2 (two) times daily.   11   No current facility-administered medications for this visit.         Past Medical History:  Diagnosis Date  . Anemia of chronic disease   . Arthritis   . Deceased-donor kidney transplant    Performed at [facility], April 2010.  Initial ESRD due to HTN nephropathy  .  Eczema   . ESRD (end stage renal disease) ([place])    s/p transplant creatinine baseline 1.1  M/W/F dialysis  . FUO (fever of unknown origin) 05/17/2015  . GERD (gastroesophageal reflux disease)   . Headache(784.0)   . History of hyperparathyroidism   . Hypertension   . Shortness of breath   . Wears glasses          Past Surgical History:  Procedure Laterality Date  . A/V FISTULAGRAM Left 02/12/2017   Procedure: A/V Fistulagram;  Surgeon: Algernon Huxley, MD;  Location: [place] [facility];  Service: Cardiovascular;  Laterality: Left;  . A/V SHUNT INTERVENTION N/A 02/12/2017   Procedure: A/V Shunt Intervention;  Surgeon: Algernon Huxley, MD;  Location: [place] [facility];  Service: Cardiovascular;  Laterality: N/A;  . AV FISTULA PLACEMENT    . BASCILIC VEIN TRANSPOSITION Left 10/30/2014   Procedure: LEFT BASCILIC VEIN TRANSPOSITION;  Surgeon: Rosetta Posner, MD;  Location: [place];  Service: Vascular;  Laterality: Left;  . BASCILIC VEIN TRANSPOSITION Left 01/03/2015   Procedure: LEFT ARM 2ND STAGE BASCILIC VEIN TRANSPOSITION;  Surgeon: Rosetta Posner, MD;  Location: [place];  Service: Vascular;  Laterality: Left;  . FRACTURE SURGERY     left foot,baby toe nad next toe missing  . INSERTION  OF DIALYSIS CATHETER Right 10/30/2014   Procedure: INSERTION OF DIALYSIS CATHETER;  Surgeon: Rosetta Posner, MD;  Location: [place];  Service: Vascular;  Laterality: Right;  . KIDNEY TRANSPLANT  11/2008   [facility])  . WISDOM TOOTH EXTRACTION       Social History       Social History  Substance Use Topics  . Smoking status: Current Every Day Smoker    Packs/day: 0.50    Years: 27.00    Types: Cigarettes  . Smokeless tobacco: Never Used     Comment: 10 cigarettes a day  . Alcohol use No     Comment: occasional drinker noted in the past    Family History       Family History  Problem Relation Age of Onset  . Hypertension Mother   . Hypertension  Father   . Diabetes Brother   . Deep vein thrombosis Brother   . Hypertension Sister   . Hyperlipidemia Sister   . Kidney disease Brother     on HD        Allergies  Allergen Reactions  . Penicillin G Itching and Rash  . Penicillins Itching and Rash     REVIEW OF SYSTEMS(Negative unless checked)  Constitutional: [] Weight loss[] Fever[] Chills Cardiac:[] Chest pain[] Chest pressure[] Palpitations [] Shortness of breath when laying flat [] Shortness of breath at rest [] Shortness of breath with exertion. Vascular: [] Pain in legs with walking[] Pain in legsat rest[] Pain in legs when laying flat [] Claudication [] Pain in feet when walking [] Pain in feet at rest [] Pain in feet when laying flat [] History of DVT [] Phlebitis [] Swelling in legs [] Varicose veins [x] Non-healing ulcers Pulmonary: [] Uses home oxygen [] Productive cough[] Hemoptysis [] Wheeze [] COPD [] Asthma Neurologic: [] Dizziness [] Blackouts [] Seizures [] History of stroke [] History of TIA[] Aphasia [] Temporary blindness[] Dysphagia [] Weaknessor numbness in arms [] Weakness or numbnessin legs Musculoskeletal: [] Arthritis [] Joint swelling [] Joint pain [] Low back pain Hematologic:[] Easy bruising[x] Easy bleeding [] Hypercoagulable state [] Anemic  Gastrointestinal:[] Blood in stool[] Vomiting blood[] Gastroesophageal reflux/heartburn[] Abdominal pain Genitourinary: [x] Chronic kidney disease [] Difficulturination [] Frequenturination [] Burning with urination[] Hematuria Skin: [] Rashes [x] Ulcers [x] Wounds Psychological: [] History of anxiety[] History of major depression.      Physical Examination  BP 128/88 (BP Location: Right Arm, Patient Position: Sitting)   Pulse 96   Resp 15   Ht 5\' 6"  (1.676 m)   Wt 75.8 kg (167 lb)   LMP 06/02/2015   BMI 26.95 kg/m  Gen:  WD/WN, NAD Head: [place]/AT, No temporalis  wasting. Ear/Nose/Throat: Hearing grossly intact, nares w/o erythema or drainage, trachea midline Eyes: Conjunctiva clear. Sclera non-icteric Neck: Supple.  No JVD.  Pulmonary:  Good air movement, no use of accessory muscles.  Cardiac: RRR Vascular: Excellent thrill in left brachiobasilic AV fistula.  Some thinning of the skin at the access sites but no open ulceration Vessel Right Left  Radial Palpable Palpable                                    Musculoskeletal: M/S 5/5 throughout.  No deformity or atrophy.  No edema. Neurologic: Sensation grossly intact in extremities.  Symmetrical.  Speech is fluent.  Psychiatric: Judgment intact, Mood & affect appropriate for pt's clinical situation. Dermatologic: No rashes or ulcers noted.  No cellulitis or open wounds.       Labs No results found for this or any previous visit (from the past 2160 hour(s)).  Radiology No results found.   Assessment/Plan  Hypertension blood pressure control important in reducing the progression of atherosclerotic disease. On appropriate oral  medications.   ESRD on dialysis [facility]) Her duplex today shows a widely patent left brachiobasilic AV fistula with the previous stent placement being widely patent as well. This has required previous intervention and does still have some thin skin, so we will continue to follow this on 27-month intervals with duplex.    Leotis Pain, MD  10/13/2017 9:44 AM    This note was created with Dragon medical transcription system.  Any errors from dictation are purely unintentional

## 2017-10-13 NOTE — Patient Instructions (Signed)
Vascular Access for Hemodialysis A vascular access is a connection between two blood vessels that allows blood to be easily removed from the body and returned to the body during hemodialysis. Hemodialysis is a procedure in which a machine outside of the body filters the blood. There are three types of vascular accesses:  Arteriovenous fistula. This is a connection between an artery and a vein (usually in the arm) that is made by sewing them together. Blood in the artery flows directly into the vein, causing it to get larger over time. This makes it easier for the vein to be used for hemodialysis. An arteriovenous fistula takes 1-6 months to develop after surgery.  Arteriovenous graft. This is a connection between an artery and a vein in the arm that is made with a tube. An arteriovenous graft can be used within 2-3 weeks of surgery.  Venous catheter. This is a thin, flexible tube that is placed in a large vein (usually in the neck, chest, or groin). A venous catheter for hemodialysis contains two tubes that come out of the skin. A venous catheter can be used right away. It is usually used as a temporary access if you need hemodialysis before a fistula or graft has developed. It may also be used as a permanent access if a fistula or graft cannot be created.  Which type of access is best for me? The type of access that is best for you depends on the size and strength of your veins. A fistula is usually the preferred type of access. It can last several years and is less likely than the other types of accesses to become infected or to cause blood clots within a blood vessel (thrombosis). However, a fistula is not an option for everyone. If your veins are not the right size, a graft may be used instead. Grafts require you to have strong veins. If your veins are not strong enough for a graft, a catheter may be used. Catheters are more likely than fistulas and grafts to become infected or to have  thrombosis. Sometimes, only one type of access is an option. Your health care provider will help you determine which type of access is best for you. How is a vascular access used? The way the access is used depends on the type of access:  If the access is a fistula or graft, two needles are inserted through the skin into the access before each hemodialysis session. Blood leaves the body through one of the needles and travels through a tube to the hemodialysis machine (dialyzer). It then flows through another tube and returns to the body through the second needle.  If the access is a catheter, one tube is connected directly to the tube that leads to the dialyzer and the other is connected to a tube that leads away from the dialyzer. Blood leaves the body through one tube and returns to the body through the other.  What kind of problems can occur with vascular accesses?  Blood clots within a blood vessel (thrombosis). Thrombosis can lead to a narrowing of a blood vessel or tube (stenosis). If thrombosis occurs frequently, another access site may be created as a backup.  Infection. These problems are most likely to occur with a venous catheter and least likely to occur with an arteriovenous fistula. How do I care for my vascular access? Wear a medical alert bracelet. This tells health care providers that you are a dialysis patient in the case of an emergency and   allows them to care for your veins appropriately. If you have a graft or fistula:  A "bruit" is a noise that is heard with a stethoscope and a "thrill" is a vibration felt over the graft or fistula. The presence of the bruit and thrill indicates that the access is working. You will be taught to feel for the thrill each day. If this is not felt, the access may be clotted. Call your health care provider.  You may use the arm where your vascular access is located freely after the site heals. Keep the following in mind: ? Avoid pressure on the  arm. ? Avoid lifting heavy objects with the arm. ? Avoid sleeping on the arm. ? Avoid wearing tight-sleeved shirts or jewelry around the graft or fistula.  Do not allow blood pressure monitoring or needle punctures on the side where the graft or fistula is located.  With permission from your health care provider, you may do exercises to help with blood flow through a fistula. These exercises involve squeezing a rubber ball or other soft objects as instructed.  Contact a health care provider if:  Chills develop.  You have an oral temperature above 102 F (38.9 C).  Swelling around the graft or fistula gets worse.  New pain develops.  Pus or other fluid (drainage) is seen at the vascular access site.  Skin redness or red streaking is seen on the skin around, above, or below the vascular access. Get help right away if:  Pain, numbness, or an unusual pale skin color develops in the hand on the side of your fistula.  Dizziness or weakness develops that you have not had before.  The vascular access has bleeding that cannot be easily controlled. This information is not intended to replace advice given to you by your health care provider. Make sure you discuss any questions you have with your health care provider. Document Released: 10/11/2002 Document Revised: 12/27/2015 Document Reviewed: 12/07/2012 Elsevier Interactive Patient Education  2017 [employer]  

## 2017-10-13 NOTE — Assessment & Plan Note (Signed)
blood pressure control important in reducing the progression of atherosclerotic disease. On appropriate oral medications.  

## 2017-10-13 NOTE — Assessment & Plan Note (Signed)
Her duplex today shows a widely patent left brachiobasilic AV fistula with the previous stent placement being widely patent as well. This has required previous intervention and does still have some thin skin, so we will continue to follow this on 59-month intervals with duplex.

## 2017-10-14 DIAGNOSIS — N2581 Secondary hyperparathyroidism of renal origin: Secondary | ICD-10-CM | POA: Diagnosis not present

## 2017-10-14 DIAGNOSIS — N186 End stage renal disease: Secondary | ICD-10-CM | POA: Diagnosis not present

## 2017-10-14 DIAGNOSIS — E1129 Type 2 diabetes mellitus with other diabetic kidney complication: Secondary | ICD-10-CM | POA: Diagnosis not present

## 2017-10-16 DIAGNOSIS — N2581 Secondary hyperparathyroidism of renal origin: Secondary | ICD-10-CM | POA: Diagnosis not present

## 2017-10-16 DIAGNOSIS — E1129 Type 2 diabetes mellitus with other diabetic kidney complication: Secondary | ICD-10-CM | POA: Diagnosis not present

## 2017-10-16 DIAGNOSIS — N186 End stage renal disease: Secondary | ICD-10-CM | POA: Diagnosis not present

## 2017-10-19 DIAGNOSIS — E1129 Type 2 diabetes mellitus with other diabetic kidney complication: Secondary | ICD-10-CM | POA: Diagnosis not present

## 2017-10-19 DIAGNOSIS — N186 End stage renal disease: Secondary | ICD-10-CM | POA: Diagnosis not present

## 2017-10-19 DIAGNOSIS — N2581 Secondary hyperparathyroidism of renal origin: Secondary | ICD-10-CM | POA: Diagnosis not present

## 2017-10-21 DIAGNOSIS — N186 End stage renal disease: Secondary | ICD-10-CM | POA: Diagnosis not present

## 2017-10-21 DIAGNOSIS — N2581 Secondary hyperparathyroidism of renal origin: Secondary | ICD-10-CM | POA: Diagnosis not present

## 2017-10-21 DIAGNOSIS — E1129 Type 2 diabetes mellitus with other diabetic kidney complication: Secondary | ICD-10-CM | POA: Diagnosis not present

## 2017-10-23 DIAGNOSIS — N2581 Secondary hyperparathyroidism of renal origin: Secondary | ICD-10-CM | POA: Diagnosis not present

## 2017-10-23 DIAGNOSIS — N186 End stage renal disease: Secondary | ICD-10-CM | POA: Diagnosis not present

## 2017-10-23 DIAGNOSIS — E1129 Type 2 diabetes mellitus with other diabetic kidney complication: Secondary | ICD-10-CM | POA: Diagnosis not present

## 2017-10-26 DIAGNOSIS — N2581 Secondary hyperparathyroidism of renal origin: Secondary | ICD-10-CM | POA: Diagnosis not present

## 2017-10-26 DIAGNOSIS — N186 End stage renal disease: Secondary | ICD-10-CM | POA: Diagnosis not present

## 2017-10-26 DIAGNOSIS — E1129 Type 2 diabetes mellitus with other diabetic kidney complication: Secondary | ICD-10-CM | POA: Diagnosis not present

## 2017-10-28 DIAGNOSIS — N186 End stage renal disease: Secondary | ICD-10-CM | POA: Diagnosis not present

## 2017-10-28 DIAGNOSIS — E1129 Type 2 diabetes mellitus with other diabetic kidney complication: Secondary | ICD-10-CM | POA: Diagnosis not present

## 2017-10-28 DIAGNOSIS — N2581 Secondary hyperparathyroidism of renal origin: Secondary | ICD-10-CM | POA: Diagnosis not present

## 2017-10-30 DIAGNOSIS — N2581 Secondary hyperparathyroidism of renal origin: Secondary | ICD-10-CM | POA: Diagnosis not present

## 2017-10-30 DIAGNOSIS — N186 End stage renal disease: Secondary | ICD-10-CM | POA: Diagnosis not present

## 2017-10-30 DIAGNOSIS — E1129 Type 2 diabetes mellitus with other diabetic kidney complication: Secondary | ICD-10-CM | POA: Diagnosis not present

## 2017-11-02 DIAGNOSIS — N186 End stage renal disease: Secondary | ICD-10-CM | POA: Diagnosis not present

## 2017-11-02 DIAGNOSIS — E1129 Type 2 diabetes mellitus with other diabetic kidney complication: Secondary | ICD-10-CM | POA: Diagnosis not present

## 2017-11-02 DIAGNOSIS — Z992 Dependence on renal dialysis: Secondary | ICD-10-CM | POA: Diagnosis not present

## 2017-11-02 DIAGNOSIS — I129 Hypertensive chronic kidney disease with stage 1 through stage 4 chronic kidney disease, or unspecified chronic kidney disease: Secondary | ICD-10-CM | POA: Diagnosis not present

## 2017-11-02 DIAGNOSIS — N2581 Secondary hyperparathyroidism of renal origin: Secondary | ICD-10-CM | POA: Diagnosis not present

## 2017-11-04 DIAGNOSIS — N2581 Secondary hyperparathyroidism of renal origin: Secondary | ICD-10-CM | POA: Diagnosis not present

## 2017-11-04 DIAGNOSIS — N186 End stage renal disease: Secondary | ICD-10-CM | POA: Diagnosis not present

## 2017-11-04 DIAGNOSIS — E1129 Type 2 diabetes mellitus with other diabetic kidney complication: Secondary | ICD-10-CM | POA: Diagnosis not present

## 2017-11-06 DIAGNOSIS — E1129 Type 2 diabetes mellitus with other diabetic kidney complication: Secondary | ICD-10-CM | POA: Diagnosis not present

## 2017-11-06 DIAGNOSIS — N2581 Secondary hyperparathyroidism of renal origin: Secondary | ICD-10-CM | POA: Diagnosis not present

## 2017-11-06 DIAGNOSIS — N186 End stage renal disease: Secondary | ICD-10-CM | POA: Diagnosis not present

## 2017-11-09 DIAGNOSIS — N2581 Secondary hyperparathyroidism of renal origin: Secondary | ICD-10-CM | POA: Diagnosis not present

## 2017-11-09 DIAGNOSIS — E1129 Type 2 diabetes mellitus with other diabetic kidney complication: Secondary | ICD-10-CM | POA: Diagnosis not present

## 2017-11-09 DIAGNOSIS — N186 End stage renal disease: Secondary | ICD-10-CM | POA: Diagnosis not present

## 2017-11-11 DIAGNOSIS — N2581 Secondary hyperparathyroidism of renal origin: Secondary | ICD-10-CM | POA: Diagnosis not present

## 2017-11-11 DIAGNOSIS — N186 End stage renal disease: Secondary | ICD-10-CM | POA: Diagnosis not present

## 2017-11-11 DIAGNOSIS — E1129 Type 2 diabetes mellitus with other diabetic kidney complication: Secondary | ICD-10-CM | POA: Diagnosis not present

## 2017-11-13 DIAGNOSIS — E1129 Type 2 diabetes mellitus with other diabetic kidney complication: Secondary | ICD-10-CM | POA: Diagnosis not present

## 2017-11-13 DIAGNOSIS — N2581 Secondary hyperparathyroidism of renal origin: Secondary | ICD-10-CM | POA: Diagnosis not present

## 2017-11-13 DIAGNOSIS — N186 End stage renal disease: Secondary | ICD-10-CM | POA: Diagnosis not present

## 2017-11-16 DIAGNOSIS — N186 End stage renal disease: Secondary | ICD-10-CM | POA: Diagnosis not present

## 2017-11-16 DIAGNOSIS — E1129 Type 2 diabetes mellitus with other diabetic kidney complication: Secondary | ICD-10-CM | POA: Diagnosis not present

## 2017-11-16 DIAGNOSIS — N2581 Secondary hyperparathyroidism of renal origin: Secondary | ICD-10-CM | POA: Diagnosis not present

## 2017-11-18 DIAGNOSIS — E1129 Type 2 diabetes mellitus with other diabetic kidney complication: Secondary | ICD-10-CM | POA: Diagnosis not present

## 2017-11-18 DIAGNOSIS — N2581 Secondary hyperparathyroidism of renal origin: Secondary | ICD-10-CM | POA: Diagnosis not present

## 2017-11-18 DIAGNOSIS — N186 End stage renal disease: Secondary | ICD-10-CM | POA: Diagnosis not present

## 2017-11-20 DIAGNOSIS — N186 End stage renal disease: Secondary | ICD-10-CM | POA: Diagnosis not present

## 2017-11-20 DIAGNOSIS — N2581 Secondary hyperparathyroidism of renal origin: Secondary | ICD-10-CM | POA: Diagnosis not present

## 2017-11-20 DIAGNOSIS — E1129 Type 2 diabetes mellitus with other diabetic kidney complication: Secondary | ICD-10-CM | POA: Diagnosis not present

## 2017-11-23 DIAGNOSIS — E1129 Type 2 diabetes mellitus with other diabetic kidney complication: Secondary | ICD-10-CM | POA: Diagnosis not present

## 2017-11-23 DIAGNOSIS — N2581 Secondary hyperparathyroidism of renal origin: Secondary | ICD-10-CM | POA: Diagnosis not present

## 2017-11-23 DIAGNOSIS — N186 End stage renal disease: Secondary | ICD-10-CM | POA: Diagnosis not present

## 2017-11-25 DIAGNOSIS — N2581 Secondary hyperparathyroidism of renal origin: Secondary | ICD-10-CM | POA: Diagnosis not present

## 2017-11-25 DIAGNOSIS — N186 End stage renal disease: Secondary | ICD-10-CM | POA: Diagnosis not present

## 2017-11-25 DIAGNOSIS — E1129 Type 2 diabetes mellitus with other diabetic kidney complication: Secondary | ICD-10-CM | POA: Diagnosis not present

## 2017-11-27 DIAGNOSIS — E1129 Type 2 diabetes mellitus with other diabetic kidney complication: Secondary | ICD-10-CM | POA: Diagnosis not present

## 2017-11-27 DIAGNOSIS — N2581 Secondary hyperparathyroidism of renal origin: Secondary | ICD-10-CM | POA: Diagnosis not present

## 2017-11-27 DIAGNOSIS — N186 End stage renal disease: Secondary | ICD-10-CM | POA: Diagnosis not present

## 2017-11-30 DIAGNOSIS — E1129 Type 2 diabetes mellitus with other diabetic kidney complication: Secondary | ICD-10-CM | POA: Diagnosis not present

## 2017-11-30 DIAGNOSIS — N186 End stage renal disease: Secondary | ICD-10-CM | POA: Diagnosis not present

## 2017-11-30 DIAGNOSIS — N2581 Secondary hyperparathyroidism of renal origin: Secondary | ICD-10-CM | POA: Diagnosis not present

## 2017-12-02 DIAGNOSIS — Z992 Dependence on renal dialysis: Secondary | ICD-10-CM | POA: Diagnosis not present

## 2017-12-02 DIAGNOSIS — N186 End stage renal disease: Secondary | ICD-10-CM | POA: Diagnosis not present

## 2017-12-02 DIAGNOSIS — I129 Hypertensive chronic kidney disease with stage 1 through stage 4 chronic kidney disease, or unspecified chronic kidney disease: Secondary | ICD-10-CM | POA: Diagnosis not present

## 2017-12-02 DIAGNOSIS — E1129 Type 2 diabetes mellitus with other diabetic kidney complication: Secondary | ICD-10-CM | POA: Diagnosis not present

## 2017-12-02 DIAGNOSIS — N2581 Secondary hyperparathyroidism of renal origin: Secondary | ICD-10-CM | POA: Diagnosis not present

## 2017-12-02 DIAGNOSIS — D631 Anemia in chronic kidney disease: Secondary | ICD-10-CM | POA: Diagnosis not present

## 2017-12-04 DIAGNOSIS — N2581 Secondary hyperparathyroidism of renal origin: Secondary | ICD-10-CM | POA: Diagnosis not present

## 2017-12-04 DIAGNOSIS — E1129 Type 2 diabetes mellitus with other diabetic kidney complication: Secondary | ICD-10-CM | POA: Diagnosis not present

## 2017-12-04 DIAGNOSIS — D631 Anemia in chronic kidney disease: Secondary | ICD-10-CM | POA: Diagnosis not present

## 2017-12-04 DIAGNOSIS — N186 End stage renal disease: Secondary | ICD-10-CM | POA: Diagnosis not present

## 2017-12-07 DIAGNOSIS — N2581 Secondary hyperparathyroidism of renal origin: Secondary | ICD-10-CM | POA: Diagnosis not present

## 2017-12-07 DIAGNOSIS — N186 End stage renal disease: Secondary | ICD-10-CM | POA: Diagnosis not present

## 2017-12-07 DIAGNOSIS — E1129 Type 2 diabetes mellitus with other diabetic kidney complication: Secondary | ICD-10-CM | POA: Diagnosis not present

## 2017-12-07 DIAGNOSIS — D631 Anemia in chronic kidney disease: Secondary | ICD-10-CM | POA: Diagnosis not present

## 2017-12-09 DIAGNOSIS — N2581 Secondary hyperparathyroidism of renal origin: Secondary | ICD-10-CM | POA: Diagnosis not present

## 2017-12-09 DIAGNOSIS — E1129 Type 2 diabetes mellitus with other diabetic kidney complication: Secondary | ICD-10-CM | POA: Diagnosis not present

## 2017-12-09 DIAGNOSIS — N186 End stage renal disease: Secondary | ICD-10-CM | POA: Diagnosis not present

## 2017-12-09 DIAGNOSIS — D631 Anemia in chronic kidney disease: Secondary | ICD-10-CM | POA: Diagnosis not present

## 2017-12-11 DIAGNOSIS — N2581 Secondary hyperparathyroidism of renal origin: Secondary | ICD-10-CM | POA: Diagnosis not present

## 2017-12-11 DIAGNOSIS — E1129 Type 2 diabetes mellitus with other diabetic kidney complication: Secondary | ICD-10-CM | POA: Diagnosis not present

## 2017-12-11 DIAGNOSIS — D631 Anemia in chronic kidney disease: Secondary | ICD-10-CM | POA: Diagnosis not present

## 2017-12-11 DIAGNOSIS — N186 End stage renal disease: Secondary | ICD-10-CM | POA: Diagnosis not present

## 2017-12-14 DIAGNOSIS — N2581 Secondary hyperparathyroidism of renal origin: Secondary | ICD-10-CM | POA: Diagnosis not present

## 2017-12-14 DIAGNOSIS — N186 End stage renal disease: Secondary | ICD-10-CM | POA: Diagnosis not present

## 2017-12-14 DIAGNOSIS — D631 Anemia in chronic kidney disease: Secondary | ICD-10-CM | POA: Diagnosis not present

## 2017-12-14 DIAGNOSIS — E1129 Type 2 diabetes mellitus with other diabetic kidney complication: Secondary | ICD-10-CM | POA: Diagnosis not present

## 2017-12-16 DIAGNOSIS — E1129 Type 2 diabetes mellitus with other diabetic kidney complication: Secondary | ICD-10-CM | POA: Diagnosis not present

## 2017-12-16 DIAGNOSIS — D631 Anemia in chronic kidney disease: Secondary | ICD-10-CM | POA: Diagnosis not present

## 2017-12-16 DIAGNOSIS — N186 End stage renal disease: Secondary | ICD-10-CM | POA: Diagnosis not present

## 2017-12-16 DIAGNOSIS — N2581 Secondary hyperparathyroidism of renal origin: Secondary | ICD-10-CM | POA: Diagnosis not present

## 2017-12-18 DIAGNOSIS — N186 End stage renal disease: Secondary | ICD-10-CM | POA: Diagnosis not present

## 2017-12-18 DIAGNOSIS — E1129 Type 2 diabetes mellitus with other diabetic kidney complication: Secondary | ICD-10-CM | POA: Diagnosis not present

## 2017-12-18 DIAGNOSIS — D631 Anemia in chronic kidney disease: Secondary | ICD-10-CM | POA: Diagnosis not present

## 2017-12-18 DIAGNOSIS — N2581 Secondary hyperparathyroidism of renal origin: Secondary | ICD-10-CM | POA: Diagnosis not present

## 2017-12-21 DIAGNOSIS — N186 End stage renal disease: Secondary | ICD-10-CM | POA: Diagnosis not present

## 2017-12-21 DIAGNOSIS — N2581 Secondary hyperparathyroidism of renal origin: Secondary | ICD-10-CM | POA: Diagnosis not present

## 2017-12-21 DIAGNOSIS — E1129 Type 2 diabetes mellitus with other diabetic kidney complication: Secondary | ICD-10-CM | POA: Diagnosis not present

## 2017-12-21 DIAGNOSIS — D631 Anemia in chronic kidney disease: Secondary | ICD-10-CM | POA: Diagnosis not present

## 2017-12-23 DIAGNOSIS — N2581 Secondary hyperparathyroidism of renal origin: Secondary | ICD-10-CM | POA: Diagnosis not present

## 2017-12-23 DIAGNOSIS — D631 Anemia in chronic kidney disease: Secondary | ICD-10-CM | POA: Diagnosis not present

## 2017-12-23 DIAGNOSIS — E1129 Type 2 diabetes mellitus with other diabetic kidney complication: Secondary | ICD-10-CM | POA: Diagnosis not present

## 2017-12-23 DIAGNOSIS — N186 End stage renal disease: Secondary | ICD-10-CM | POA: Diagnosis not present

## 2017-12-25 DIAGNOSIS — N186 End stage renal disease: Secondary | ICD-10-CM | POA: Diagnosis not present

## 2017-12-25 DIAGNOSIS — N2581 Secondary hyperparathyroidism of renal origin: Secondary | ICD-10-CM | POA: Diagnosis not present

## 2017-12-25 DIAGNOSIS — D631 Anemia in chronic kidney disease: Secondary | ICD-10-CM | POA: Diagnosis not present

## 2017-12-25 DIAGNOSIS — E1129 Type 2 diabetes mellitus with other diabetic kidney complication: Secondary | ICD-10-CM | POA: Diagnosis not present

## 2017-12-28 DIAGNOSIS — N186 End stage renal disease: Secondary | ICD-10-CM | POA: Diagnosis not present

## 2017-12-28 DIAGNOSIS — D631 Anemia in chronic kidney disease: Secondary | ICD-10-CM | POA: Diagnosis not present

## 2017-12-28 DIAGNOSIS — E1129 Type 2 diabetes mellitus with other diabetic kidney complication: Secondary | ICD-10-CM | POA: Diagnosis not present

## 2017-12-28 DIAGNOSIS — N2581 Secondary hyperparathyroidism of renal origin: Secondary | ICD-10-CM | POA: Diagnosis not present

## 2017-12-29 ENCOUNTER — Encounter (INDEPENDENT_AMBULATORY_CARE_PROVIDER_SITE_OTHER): Payer: Self-pay

## 2017-12-29 ENCOUNTER — Other Ambulatory Visit (INDEPENDENT_AMBULATORY_CARE_PROVIDER_SITE_OTHER): Payer: Self-pay | Admitting: Vascular Surgery

## 2017-12-29 MED ORDER — CLINDAMYCIN PHOSPHATE 300 MG/50ML IV SOLN
300.0000 mg | Freq: Once | INTRAVENOUS | Status: AC
  Administered 2017-12-30: 300 mg via INTRAVENOUS

## 2017-12-30 ENCOUNTER — Encounter: Payer: Self-pay | Admitting: *Deleted

## 2017-12-30 ENCOUNTER — Ambulatory Visit
Admission: RE | Admit: 2017-12-30 | Discharge: 2017-12-30 | Disposition: A | Discharge status: Home / Self Care | Payer: Medicare Other | Source: Ambulatory Visit | Attending: Vascular Surgery | Admitting: Vascular Surgery

## 2017-12-30 ENCOUNTER — Encounter
Admission: RE | Disposition: A | Discharge status: Home / Self Care | Payer: Self-pay | Source: Ambulatory Visit | Attending: Vascular Surgery

## 2017-12-30 DIAGNOSIS — F17210 Nicotine dependence, cigarettes, uncomplicated: Secondary | ICD-10-CM | POA: Insufficient documentation

## 2017-12-30 DIAGNOSIS — Z940 Kidney transplant status: Secondary | ICD-10-CM | POA: Diagnosis not present

## 2017-12-30 DIAGNOSIS — T82858A Stenosis of vascular prosthetic devices, implants and grafts, initial encounter: Secondary | ICD-10-CM | POA: Insufficient documentation

## 2017-12-30 DIAGNOSIS — I120 Hypertensive chronic kidney disease with stage 5 chronic kidney disease or end stage renal disease: Secondary | ICD-10-CM | POA: Insufficient documentation

## 2017-12-30 DIAGNOSIS — D631 Anemia in chronic kidney disease: Secondary | ICD-10-CM | POA: Diagnosis not present

## 2017-12-30 DIAGNOSIS — Y832 Surgical operation with anastomosis, bypass or graft as the cause of abnormal reaction of the patient, or of later complication, without mention of misadventure at the time of the procedure: Secondary | ICD-10-CM | POA: Diagnosis not present

## 2017-12-30 DIAGNOSIS — Z841 Family history of disorders of kidney and ureter: Secondary | ICD-10-CM | POA: Insufficient documentation

## 2017-12-30 DIAGNOSIS — Z880 Allergy status to penicillin: Secondary | ICD-10-CM | POA: Diagnosis not present

## 2017-12-30 DIAGNOSIS — M1990 Unspecified osteoarthritis, unspecified site: Secondary | ICD-10-CM | POA: Insufficient documentation

## 2017-12-30 DIAGNOSIS — Z992 Dependence on renal dialysis: Secondary | ICD-10-CM | POA: Diagnosis not present

## 2017-12-30 DIAGNOSIS — Z8249 Family history of ischemic heart disease and other diseases of the circulatory system: Secondary | ICD-10-CM | POA: Insufficient documentation

## 2017-12-30 DIAGNOSIS — K219 Gastro-esophageal reflux disease without esophagitis: Secondary | ICD-10-CM | POA: Insufficient documentation

## 2017-12-30 DIAGNOSIS — Z9889 Other specified postprocedural states: Secondary | ICD-10-CM | POA: Insufficient documentation

## 2017-12-30 DIAGNOSIS — N186 End stage renal disease: Secondary | ICD-10-CM | POA: Diagnosis not present

## 2017-12-30 DIAGNOSIS — N2581 Secondary hyperparathyroidism of renal origin: Secondary | ICD-10-CM | POA: Diagnosis not present

## 2017-12-30 DIAGNOSIS — T82868A Thrombosis of vascular prosthetic devices, implants and grafts, initial encounter: Secondary | ICD-10-CM | POA: Diagnosis not present

## 2017-12-30 DIAGNOSIS — E1129 Type 2 diabetes mellitus with other diabetic kidney complication: Secondary | ICD-10-CM | POA: Diagnosis not present

## 2017-12-30 DIAGNOSIS — I10 Essential (primary) hypertension: Secondary | ICD-10-CM | POA: Diagnosis not present

## 2017-12-30 HISTORY — PX: A/V FISTULAGRAM: CATH118298

## 2017-12-30 LAB — POTASSIUM (ARMC VASCULAR LAB ONLY): POTASSIUM (ARMC VASCULAR LAB): 3.3 — AB (ref 3.5–5.1)

## 2017-12-30 SURGERY — A/V FISTULAGRAM
Anesthesia: Moderate Sedation | Laterality: Left

## 2017-12-30 MED ORDER — BACITRACIN-NEOMYCIN-POLYMYXIN 400-5-5000 EX OINT
TOPICAL_OINTMENT | CUTANEOUS | Status: AC
  Filled 2017-12-30: qty 3

## 2017-12-30 MED ORDER — HYDROMORPHONE HCL 1 MG/ML IJ SOLN: 1.0000 mg | Freq: Once | INTRAMUSCULAR | Status: DC | PRN

## 2017-12-30 MED ORDER — HEPARIN SODIUM (PORCINE) 1000 UNIT/ML IJ SOLN
INTRAMUSCULAR | Status: DC | PRN
  Administered 2017-12-30: 3000 [IU] via INTRAVENOUS

## 2017-12-30 MED ORDER — ONDANSETRON HCL 4 MG/2ML IJ SOLN: 4.0000 mg | Freq: Four times a day (QID) | INTRAMUSCULAR | Status: DC | PRN

## 2017-12-30 MED ORDER — FAMOTIDINE 20 MG PO TABS: 40.0000 mg | ORAL_TABLET | ORAL | Status: DC | PRN

## 2017-12-30 MED ORDER — BACITRACIN-NEOMYCIN-POLYMYXIN 400-5-5000 EX OINT
TOPICAL_OINTMENT | CUTANEOUS | Status: AC
  Filled 2017-12-30: qty 1

## 2017-12-30 MED ORDER — HEPARIN SODIUM (PORCINE) 1000 UNIT/ML IJ SOLN
INTRAMUSCULAR | Status: AC
  Filled 2017-12-30: qty 1

## 2017-12-30 MED ORDER — MIDAZOLAM HCL 2 MG/2ML IJ SOLN
INTRAMUSCULAR | Status: DC | PRN
  Administered 2017-12-30: 2 mg via INTRAVENOUS

## 2017-12-30 MED ORDER — METHYLPREDNISOLONE SODIUM SUCC 125 MG IJ SOLR: 125.0000 mg | INTRAMUSCULAR | Status: DC | PRN

## 2017-12-30 MED ORDER — CLINDAMYCIN PHOSPHATE 300 MG/50ML IV SOLN
INTRAVENOUS | Status: AC
  Administered 2017-12-30: 300 mg via INTRAVENOUS
  Filled 2017-12-30: qty 50

## 2017-12-30 MED ORDER — FENTANYL CITRATE (PF) 100 MCG/2ML IJ SOLN
INTRAMUSCULAR | Status: DC | PRN
  Administered 2017-12-30: 50 ug via INTRAVENOUS

## 2017-12-30 MED ORDER — HEPARIN (PORCINE) IN NACL 1000-0.9 UT/500ML-% IV SOLN
INTRAVENOUS | Status: AC
  Filled 2017-12-30: qty 1000

## 2017-12-30 MED ORDER — SODIUM CHLORIDE 0.9 % IV SOLN
INTRAVENOUS | Status: DC
  Administered 2017-12-30: 11:00:00 via INTRAVENOUS

## 2017-12-30 MED ORDER — FENTANYL CITRATE (PF) 100 MCG/2ML IJ SOLN
INTRAMUSCULAR | Status: AC
  Filled 2017-12-30: qty 2

## 2017-12-30 MED ORDER — IBUPROFEN 400 MG PO TABS
400.0000 mg | ORAL_TABLET | Freq: Once | ORAL | Status: AC
  Administered 2017-12-30: 400 mg via ORAL
  Filled 2017-12-30: qty 1

## 2017-12-30 MED ORDER — MIDAZOLAM HCL 5 MG/5ML IJ SOLN
INTRAMUSCULAR | Status: AC
  Filled 2017-12-30: qty 5

## 2017-12-30 MED ORDER — LIDOCAINE HCL (PF) 1 % IJ SOLN
INTRAMUSCULAR | Status: AC
  Filled 2017-12-30: qty 30

## 2017-12-30 MED ORDER — IOPAMIDOL (ISOVUE-300) INJECTION 61%
INTRAVENOUS | Status: DC | PRN
  Administered 2017-12-30: 25 mL via INTRA_ARTERIAL

## 2017-12-30 SURGICAL SUPPLY — 11 items
BALLN LUTONIX AV 8X60X75 (BALLOONS) ×3
BALLOON LUTONIX AV 8X60X75 (BALLOONS) ×1 IMPLANT
CANNULA 5F STIFF (CANNULA) ×3 IMPLANT
COVER PROBE U/S 5X48 (MISCELLANEOUS) ×3 IMPLANT
DEVICE PRESTO INFLATION (MISCELLANEOUS) ×3 IMPLANT
PACK ANGIOGRAPHY (CUSTOM PROCEDURE TRAY) ×3 IMPLANT
SHEATH BRITE TIP 6FRX5.5 (SHEATH) ×3 IMPLANT
SUT MNCRL 4-0 (SUTURE) ×2
SUT MNCRL 4-0 27XMFL (SUTURE) ×1
SUTURE MNCRL 4-0 27XMF (SUTURE) ×1 IMPLANT
WIRE MAGIC TOR.035 180C (WIRE) ×3 IMPLANT

## 2017-12-30 NOTE — H&P (Signed)
[facility] Admission History & Physical  MRN : [id number]  Ruth Gutierrez is a 52 y.o. (01/08/1966) female who presents with chief complaint of No chief complaint on file. Marland Kitchen  History of Present Illness: I am asked to evaluate the patient by the dialysis center. The patient was sent here because they were unable to achieve adequate dialysis this morning. Furthermore the Center states there is thinning of the skin with bleeding. The patient states there there have been increasing problems with the access, such as "pulling clots" during dialysis and prolonged bleeding after decannulation. The patient estimates these problems have been going on for several weeks. The patient is unaware of any other change.  Patient denies pain or tenderness overlying the access.  There is no pain with dialysis.  The patient denies hand pain or finger pain consistent with steal syndrome.   There have been past interventions or declots of this access.  The patient is not chronically hypotensive on dialysis.  Current Facility-Administered Medications  Medication Dose Route Frequency Provider Last Rate Last Dose  . 0.9 %  sodium chloride infusion   Intravenous Continuous Stegmayer, Kimberly A, PA-C 10 mL/hr at 12/30/17 1033    . clindamycin (CLEOCIN) 300 MG/50ML IVPB           . clindamycin (CLEOCIN) IVPB 300 mg  300 mg Intravenous Once Stegmayer, Kimberly A, PA-C      . famotidine (PEPCID) tablet 40 mg  40 mg Oral PRN Stegmayer, Janalyn Harder, PA-C      . HYDROmorphone (DILAUDID) injection 1 mg  1 mg Intravenous Once PRN Stegmayer, Kimberly A, PA-C      . methylPREDNISolone sodium succinate (SOLU-MEDROL) 125 mg/2 mL injection 125 mg  125 mg Intravenous PRN Stegmayer, Kimberly A, PA-C      . ondansetron (ZOFRAN) injection 4 mg  4 mg Intravenous Q6H PRN Stegmayer, Janalyn Harder, PA-C        Past Medical History:  Diagnosis Date  . Anemia of chronic disease   . Arthritis   .  Deceased-donor kidney transplant    Performed at [facility], April 2010.  Initial ESRD due to HTN nephropathy  . Eczema   . ESRD (end stage renal disease) ([place])    s/p transplant creatinine baseline 1.1  M/W/F dialysis  . FUO (fever of unknown origin) 05/17/2015  . GERD (gastroesophageal reflux disease)   . Headache(784.0)   . History of hyperparathyroidism   . Hypertension   . Shortness of breath   . Wears glasses     Past Surgical History:  Procedure Laterality Date  . A/V FISTULAGRAM Left 02/12/2017   Procedure: A/V Fistulagram;  Surgeon: Algernon Huxley, MD;  Location: [place] [facility];  Service: Cardiovascular;  Laterality: Left;  . A/V SHUNT INTERVENTION N/A 02/12/2017   Procedure: A/V Shunt Intervention;  Surgeon: Algernon Huxley, MD;  Location: [place] [facility];  Service: Cardiovascular;  Laterality: N/A;  . AV FISTULA PLACEMENT    . BASCILIC VEIN TRANSPOSITION Left 10/30/2014   Procedure: LEFT BASCILIC VEIN TRANSPOSITION;  Surgeon: Rosetta Posner, MD;  Location: [place];  Service: Vascular;  Laterality: Left;  . BASCILIC VEIN TRANSPOSITION Left 01/03/2015   Procedure: LEFT ARM 2ND STAGE BASCILIC VEIN TRANSPOSITION;  Surgeon: Rosetta Posner, MD;  Location: [place];  Service: Vascular;  Laterality: Left;  . FRACTURE SURGERY     left foot,baby toe nad next toe missing  . INSERTION OF DIALYSIS CATHETER Right 10/30/2014  Procedure: INSERTION OF DIALYSIS CATHETER;  Surgeon: Rosetta Posner, MD;  Location: [place];  Service: Vascular;  Laterality: Right;  . KIDNEY TRANSPLANT  11/2008   [facility])  . WISDOM TOOTH EXTRACTION      Social History Social History   Tobacco Use  . Smoking status: Current Every Day Smoker    Packs/day: 0.50    Years: 27.00    Pack years: 13.50    Types: Cigarettes  . Smokeless tobacco: Never Used  . Tobacco comment: 10 cigarettes a day  Substance Use Topics  . Alcohol use: No    Alcohol/week: 0.0 oz    Comment: occasional drinker noted in the past   . Drug use: No    Family History Family History  Problem Relation Age of Onset  . Hypertension Mother   . Hypertension Father   . Diabetes Brother   . Deep vein thrombosis Brother   . Hypertension Sister   . Hyperlipidemia Sister   . Kidney disease Brother        on HD    No family history of bleeding or clotting disorders, autoimmune disease or porphyria  Allergies  Allergen Reactions  . Penicillin G Itching and Rash  . Penicillins Itching and Rash     REVIEW OF SYSTEMS (Negative unless checked)  Constitutional: [] Weight loss  [] Fever  [] Chills Cardiac: [] Chest pain   [] Chest pressure   [] Palpitations   [] Shortness of breath when laying flat   [] Shortness of breath at rest   [x] Shortness of breath with exertion. Vascular:  [] Pain in legs with walking   [] Pain in legs at rest   [] Pain in legs when laying flat   [] Claudication   [] Pain in feet when walking  [] Pain in feet at rest  [] Pain in feet when laying flat   [] History of DVT   [] Phlebitis   [] Swelling in legs   [] Varicose veins   [] Non-healing ulcers Pulmonary:   [] Uses home oxygen   [] Productive cough   [] Hemoptysis   [] Wheeze  [] COPD   [] Asthma Neurologic:  [] Dizziness  [] Blackouts   [] Seizures   [] History of stroke   [] History of TIA  [] Aphasia   [] Temporary blindness   [] Dysphagia   [] Weakness or numbness in arms   [] Weakness or numbness in legs Musculoskeletal:  [x] Arthritis   [] Joint swelling   [] Joint pain   [] Low back pain Hematologic:  [] Easy bruising  [] Easy bleeding   [] Hypercoagulable state   [x] Anemic  [] Hepatitis Gastrointestinal:  [] Blood in stool   [] Vomiting blood  [x] Gastroesophageal reflux/heartburn   [] Difficulty swallowing. Genitourinary:  [x] Chronic kidney disease   [] Difficult urination  [] Frequent urination  [] Burning with urination   [] Blood in urine Skin:  [] Rashes   [] Ulcers   [] Wounds Psychological:  [] History of anxiety   []  History of major depression.  Physical Examination  Vitals:    12/30/17 1020  BP: 106/81  Pulse: 78  Resp: 18  Temp: 98.4 F (36.9 C)  TempSrc: Oral  SpO2: 97%  Weight: 167 lb (75.8 kg)  Height: 5\' 6"  (1.676 m)   Body mass index is 26.95 kg/m. Gen: WD/WN, NAD Head: [place]/AT, No temporalis wasting. Prominent temp pulse not noted. Ear/Nose/Throat: Hearing grossly intact, nares w/o erythema or drainage, oropharynx w/o Erythema/Exudate,  Eyes: Conjunctiva clear, sclera non-icteric Neck: Trachea midline.  No JVD.  Pulmonary:  Good air movement, respirations not labored, no use of accessory muscles.  Cardiac: RRR, normal S1, S2. Vascular: aneurysmal AVF with good thrill Vessel Right Left  Radial Palpable Palpable   Musculoskeletal: M/S 5/5 throughout.  Extremities without ischemic changes.  No deformity or atrophy.  Neurologic: Sensation grossly intact in extremities.  Symmetrical.  Speech is fluent. Motor exam as listed above. Psychiatric: Judgment intact, Mood & affect appropriate for pt's clinical situation. Dermatologic: No rashes or ulcers noted.  No cellulitis or open wounds. Lymph : No Cervical, Axillary, or Inguinal lymphadenopathy.   CBC Lab Results  Component Value Date   WBC 10.8 (H) 11/16/2015   HGB 9.3 (L) 11/16/2015   HCT 27.1 (L) 11/16/2015   MCV 95.1 11/16/2015   PLT 254 11/16/2015    BMET    Component Value Date/Time   NA 133 (L) 11/16/2015 0540   K 4.0 11/16/2015 0540   CL 91 (L) 11/16/2015 0540   CO2 28 11/16/2015 0540   GLUCOSE 110 (H) 11/16/2015 0540   BUN 54 (H) 11/16/2015 0540   CREATININE 9.97 (H) 11/16/2015 0540   CREATININE 1.47 (H) 04/14/2013 1149   CALCIUM 9.3 11/16/2015 0540   CALCIUM 7.2 (L) 01/13/2007 0830   GFRNONAA 4 (L) 11/16/2015 0540   GFRNONAA 43 (L) 04/14/2013 1149   GFRAA 5 (L) 11/16/2015 0540   GFRAA 49 (L) 04/14/2013 1149   CrCl cannot be calculated (Patient's most recent lab result is older than the maximum 21 days allowed.).  COAG Lab Results  Component Value Date   INR 1.35  03/09/2011    Radiology No results found.  Assessment/Plan 1.  Complication dialysis device with bleeding AV access:  Patient's dialysis access is malfunctioning. The patient will undergo angiography and correction of any problems using interventional techniques with the hope of restoring function to the access.  The risks and benefits were described to the patient.  All questions were answered.  The patient agrees to proceed with angiography and intervention. Potassium will be drawn to ensure that it is an appropriate level prior to performing intervention. 2.  End-stage renal disease requiring hemodialysis:  Patient will continue dialysis therapy without further interruption if a successful intervention is not achieved then a tunneled catheter will be placed. Dialysis has already been arranged. 3.  Hypertension:  Patient will continue medical management; nephrology is following no changes in oral medications.     Leotis Pain, MD  12/30/2017 12:47 PM

## 2017-12-30 NOTE — Progress Notes (Signed)
Dr. Lucky Cowboy at bedside , speaking with pt. And family(mom). Both verbalized understanding of procedure, discussion, and follow-up.

## 2017-12-30 NOTE — Op Note (Signed)
[facility]    OPERATIVE NOTE   PROCEDURE: 1.   Left brachiobasilic arteriovenous fistula cannulation under ultrasound guidance 2.   Left arm fistulagram including central venogram 3.   Percutaneous transluminal angioplasty of the left basilic vein with 8 mm diameter by 6 cm length Lutonix drug-coated angioplasty balloon  PRE-OPERATIVE DIAGNOSIS: 1. ESRD 2. Poorly functional left brachiobasilic AVF with prolonged bleeding and worsening aneurysmal degeneration  POST-OPERATIVE DIAGNOSIS: same as above   SURGEON: Leotis Pain, MD  ANESTHESIA: local with MCS  ESTIMATED BLOOD LOSS: 3 cc  FINDING(S): 1. 75% stenosis of the basilic vein as it dives down into the axillary vein.  The previously placed stents in the axillary vein and the central venous circulation were widely patent.  There was some narrowing in the perianastomotic basilic vein near the access site that appeared to approach 50% at least.  SPECIMEN(S):  None  CONTRAST: 25 cc  FLUORO TIME: 0.6 minutes  MODERATE CONSCIOUS SEDATION TIME: Approximately 20 minutes with 2 mg of Versed and 50 Mcg of Fentanyl   INDICATIONS: Ruth Gutierrez is a 52 y.o. female who presents with malfunctioning left brachiobasilic arteriovenous fistula.  The patient is scheduled for left arm fistulagram.  The patient is aware the risks include but are not limited to: bleeding, infection, thrombosis of the cannulated access, and possible anaphylactic reaction to the contrast.  The patient is aware of the risks of the procedure and elects to proceed forward.  DESCRIPTION: After full informed written consent was obtained, the patient was brought back to the angiography suite and placed supine upon the angiography table.  The patient was connected to monitoring equipment. Moderate conscious sedation was administered with a face to face encounter with the patient throughout the procedure with my supervision of the RN administering  medicines and monitoring the patient's vital signs and mental status throughout from the start of the procedure until the patient was taken to the recovery room. The left arm was prepped and draped in the standard fashion for a percutaneous access intervention.  Under ultrasound guidance, the left brachiobasilic arteriovenous fistula was cannulated with a micropuncture needle under direct ultrasound guidance and a permanent image was performed.  The microwire was advanced into the fistula and the needle was exchanged for the a microsheath.  I then upsized to a 6 Fr Sheath and imaging was performed.  Hand injections were completed to image the access including the central venous system. This demonstrated 75% stenosis of the basilic vein as it dives down into the axillary vein.  The previously placed stents in the axillary vein and the central venous circulation were widely patent.  There was some narrowing in the perianastomotic basilic vein near the access site that appeared to approach 50% at least.  Based on the images, this patient will need intervention in hopes of reducing the outflow pressure which should help prolonged bleeding and hopefully lessen the aneurysmal degeneration and skin breakdown. I then gave the patient 3000 units of intravenous heparin.  I then crossed the stenosis with a Magic Tourqe wire.  Based on the imaging, a 8 mm x 6 cm Lutonix drug-coated angioplasty balloon was selected.  The balloon was centered around the basilic vein stenosis and inflated to 10 ATM for 1 minute(s).  On completion imaging, a 20 % residual stenosis was present.     Based on the completion imaging, no further intervention is necessary.  If her skin does not heal or the aneurysmal degeneration  worsens, surgical revision would be planned.  The wire and balloon were removed from the sheath.  A 4-0 Monocryl purse-string suture was sewn around the sheath.  The sheath was removed while tying down the suture.  A  sterile bandage was applied to the puncture site.  COMPLICATIONS: None  CONDITION: Stable   Leotis Pain  12/30/2017 1:33 PM   This note was created with Dragon Medical transcription system. Any errors in dictation are purely unintentional.

## 2018-01-01 DIAGNOSIS — E1129 Type 2 diabetes mellitus with other diabetic kidney complication: Secondary | ICD-10-CM | POA: Diagnosis not present

## 2018-01-01 DIAGNOSIS — D631 Anemia in chronic kidney disease: Secondary | ICD-10-CM | POA: Diagnosis not present

## 2018-01-01 DIAGNOSIS — N186 End stage renal disease: Secondary | ICD-10-CM | POA: Diagnosis not present

## 2018-01-01 DIAGNOSIS — N2581 Secondary hyperparathyroidism of renal origin: Secondary | ICD-10-CM | POA: Diagnosis not present

## 2018-01-02 DIAGNOSIS — Z992 Dependence on renal dialysis: Secondary | ICD-10-CM | POA: Diagnosis not present

## 2018-01-02 DIAGNOSIS — N186 End stage renal disease: Secondary | ICD-10-CM | POA: Diagnosis not present

## 2018-01-02 DIAGNOSIS — I129 Hypertensive chronic kidney disease with stage 1 through stage 4 chronic kidney disease, or unspecified chronic kidney disease: Secondary | ICD-10-CM | POA: Diagnosis not present

## 2018-01-04 DIAGNOSIS — E1129 Type 2 diabetes mellitus with other diabetic kidney complication: Secondary | ICD-10-CM | POA: Diagnosis not present

## 2018-01-04 DIAGNOSIS — N2581 Secondary hyperparathyroidism of renal origin: Secondary | ICD-10-CM | POA: Diagnosis not present

## 2018-01-04 DIAGNOSIS — N186 End stage renal disease: Secondary | ICD-10-CM | POA: Diagnosis not present

## 2018-01-06 DIAGNOSIS — E1129 Type 2 diabetes mellitus with other diabetic kidney complication: Secondary | ICD-10-CM | POA: Diagnosis not present

## 2018-01-06 DIAGNOSIS — N186 End stage renal disease: Secondary | ICD-10-CM | POA: Diagnosis not present

## 2018-01-06 DIAGNOSIS — N2581 Secondary hyperparathyroidism of renal origin: Secondary | ICD-10-CM | POA: Diagnosis not present

## 2018-01-08 DIAGNOSIS — N2581 Secondary hyperparathyroidism of renal origin: Secondary | ICD-10-CM | POA: Diagnosis not present

## 2018-01-08 DIAGNOSIS — N186 End stage renal disease: Secondary | ICD-10-CM | POA: Diagnosis not present

## 2018-01-08 DIAGNOSIS — E1129 Type 2 diabetes mellitus with other diabetic kidney complication: Secondary | ICD-10-CM | POA: Diagnosis not present

## 2018-01-11 DIAGNOSIS — N186 End stage renal disease: Secondary | ICD-10-CM | POA: Diagnosis not present

## 2018-01-11 DIAGNOSIS — N2581 Secondary hyperparathyroidism of renal origin: Secondary | ICD-10-CM | POA: Diagnosis not present

## 2018-01-11 DIAGNOSIS — E1129 Type 2 diabetes mellitus with other diabetic kidney complication: Secondary | ICD-10-CM | POA: Diagnosis not present

## 2018-01-13 DIAGNOSIS — N2581 Secondary hyperparathyroidism of renal origin: Secondary | ICD-10-CM | POA: Diagnosis not present

## 2018-01-13 DIAGNOSIS — N186 End stage renal disease: Secondary | ICD-10-CM | POA: Diagnosis not present

## 2018-01-13 DIAGNOSIS — E1129 Type 2 diabetes mellitus with other diabetic kidney complication: Secondary | ICD-10-CM | POA: Diagnosis not present

## 2018-01-15 DIAGNOSIS — N186 End stage renal disease: Secondary | ICD-10-CM | POA: Diagnosis not present

## 2018-01-15 DIAGNOSIS — N2581 Secondary hyperparathyroidism of renal origin: Secondary | ICD-10-CM | POA: Diagnosis not present

## 2018-01-15 DIAGNOSIS — E1129 Type 2 diabetes mellitus with other diabetic kidney complication: Secondary | ICD-10-CM | POA: Diagnosis not present

## 2018-01-18 DIAGNOSIS — N2581 Secondary hyperparathyroidism of renal origin: Secondary | ICD-10-CM | POA: Diagnosis not present

## 2018-01-18 DIAGNOSIS — N186 End stage renal disease: Secondary | ICD-10-CM | POA: Diagnosis not present

## 2018-01-18 DIAGNOSIS — E1129 Type 2 diabetes mellitus with other diabetic kidney complication: Secondary | ICD-10-CM | POA: Diagnosis not present

## 2018-01-20 DIAGNOSIS — N2581 Secondary hyperparathyroidism of renal origin: Secondary | ICD-10-CM | POA: Diagnosis not present

## 2018-01-20 DIAGNOSIS — N186 End stage renal disease: Secondary | ICD-10-CM | POA: Diagnosis not present

## 2018-01-20 DIAGNOSIS — E1129 Type 2 diabetes mellitus with other diabetic kidney complication: Secondary | ICD-10-CM | POA: Diagnosis not present

## 2018-01-22 DIAGNOSIS — N2581 Secondary hyperparathyroidism of renal origin: Secondary | ICD-10-CM | POA: Diagnosis not present

## 2018-01-22 DIAGNOSIS — E1129 Type 2 diabetes mellitus with other diabetic kidney complication: Secondary | ICD-10-CM | POA: Diagnosis not present

## 2018-01-22 DIAGNOSIS — N186 End stage renal disease: Secondary | ICD-10-CM | POA: Diagnosis not present

## 2018-01-25 DIAGNOSIS — N2581 Secondary hyperparathyroidism of renal origin: Secondary | ICD-10-CM | POA: Diagnosis not present

## 2018-01-25 DIAGNOSIS — N186 End stage renal disease: Secondary | ICD-10-CM | POA: Diagnosis not present

## 2018-01-25 DIAGNOSIS — E1129 Type 2 diabetes mellitus with other diabetic kidney complication: Secondary | ICD-10-CM | POA: Diagnosis not present

## 2018-01-27 DIAGNOSIS — N186 End stage renal disease: Secondary | ICD-10-CM | POA: Diagnosis not present

## 2018-01-27 DIAGNOSIS — E1129 Type 2 diabetes mellitus with other diabetic kidney complication: Secondary | ICD-10-CM | POA: Diagnosis not present

## 2018-01-27 DIAGNOSIS — N2581 Secondary hyperparathyroidism of renal origin: Secondary | ICD-10-CM | POA: Diagnosis not present

## 2018-01-29 DIAGNOSIS — E1129 Type 2 diabetes mellitus with other diabetic kidney complication: Secondary | ICD-10-CM | POA: Diagnosis not present

## 2018-01-29 DIAGNOSIS — N2581 Secondary hyperparathyroidism of renal origin: Secondary | ICD-10-CM | POA: Diagnosis not present

## 2018-01-29 DIAGNOSIS — N186 End stage renal disease: Secondary | ICD-10-CM | POA: Diagnosis not present

## 2018-02-01 DIAGNOSIS — Z992 Dependence on renal dialysis: Secondary | ICD-10-CM | POA: Diagnosis not present

## 2018-02-01 DIAGNOSIS — E1129 Type 2 diabetes mellitus with other diabetic kidney complication: Secondary | ICD-10-CM | POA: Diagnosis not present

## 2018-02-01 DIAGNOSIS — N2581 Secondary hyperparathyroidism of renal origin: Secondary | ICD-10-CM | POA: Diagnosis not present

## 2018-02-01 DIAGNOSIS — I129 Hypertensive chronic kidney disease with stage 1 through stage 4 chronic kidney disease, or unspecified chronic kidney disease: Secondary | ICD-10-CM | POA: Diagnosis not present

## 2018-02-01 DIAGNOSIS — N186 End stage renal disease: Secondary | ICD-10-CM | POA: Diagnosis not present

## 2018-02-02 ENCOUNTER — Encounter (INDEPENDENT_AMBULATORY_CARE_PROVIDER_SITE_OTHER): Payer: Self-pay | Admitting: Vascular Surgery

## 2018-02-02 ENCOUNTER — Ambulatory Visit (INDEPENDENT_AMBULATORY_CARE_PROVIDER_SITE_OTHER): Payer: Medicare Other | Admitting: Vascular Surgery

## 2018-02-02 VITALS — BP 135/84 | HR 92 | Resp 16 | Ht 66.0 in | Wt 180.6 lb

## 2018-02-02 DIAGNOSIS — N186 End stage renal disease: Secondary | ICD-10-CM | POA: Diagnosis not present

## 2018-02-02 DIAGNOSIS — T829XXD Unspecified complication of cardiac and vascular prosthetic device, implant and graft, subsequent encounter: Secondary | ICD-10-CM | POA: Diagnosis not present

## 2018-02-02 DIAGNOSIS — I10 Essential (primary) hypertension: Secondary | ICD-10-CM | POA: Diagnosis not present

## 2018-02-02 DIAGNOSIS — Z992 Dependence on renal dialysis: Secondary | ICD-10-CM | POA: Diagnosis not present

## 2018-02-02 NOTE — Progress Notes (Signed)
MRN : [id number]  Ruth Gutierrez is a 52 y.o. (13-Jan-1966) female who presents with chief complaint of  Chief Complaint  Patient presents with  . Follow-up    [facility] 4week wound recheck  .  History of Present Illness: Patient returns today in follow up of dialysis access.  The aneurysmal sites that her access are less prominent today after venous outflow intervention several weeks ago.  She is doing well.  The area of skin threat is improved with only a tiny scab currently present.  The skin is not nearly as thin.  No significant prolonged bleeding.  She is doing well with dialysis  Current Outpatient Medications  Medication Sig Dispense Refill  . amLODipine (NORVASC) 10 MG tablet Take 10 mg by mouth daily.     Marland Kitchen aspirin 81 MG tablet Take 81 mg by mouth daily.     Marland Kitchen atenolol (TENORMIN) 50 MG tablet Take 50 mg by mouth 2 (two) times daily.     . calcitRIOL (ROCALTROL) 0.5 MCG capsule Take 1 capsule (0.5 mcg total) by mouth every Monday, Wednesday, and Friday with hemodialysis.    Marland Kitchen calcium acetate (PHOSLO) 667 MG capsule Take 1,334-3,335 mg by mouth 3 (three) times daily with meals. Tales 5 caps with each meal, 2 caps with snacks    . diphenhydrAMINE (BENADRYL) 25 MG tablet Take 25 mg by mouth every 8 (eight) hours as needed (for cough).    . ferric citrate (AURYXIA) 1 GM 210 MG(Fe) tablet Take 420 mg by mouth 3 (three) times daily with meals.    Marland Kitchen lisinopril (PRINIVIL,ZESTRIL) 40 MG tablet Take 40 mg by mouth daily.    . norethindrone (AYGESTIN) 5 MG tablet Take 5 mg by mouth 2 (two) times daily.   11  . sevelamer carbonate (RENVELA) 800 MG tablet Take 800 mg by mouth 3 (three) times daily with meals.     No current facility-administered medications for this visit.     Past Medical History:  Diagnosis Date  . Anemia of chronic disease   . Arthritis   . Deceased-donor kidney transplant    Performed at [facility], April 2010.  Initial ESRD due to HTN nephropathy  . Eczema   .  ESRD (end stage renal disease) ([place])    s/p transplant creatinine baseline 1.1  M/W/F dialysis  . FUO (fever of unknown origin) 05/17/2015  . GERD (gastroesophageal reflux disease)   . Headache(784.0)   . History of hyperparathyroidism   . Hypertension   . Shortness of breath   . Wears glasses     Past Surgical History:  Procedure Laterality Date  . A/V FISTULAGRAM Left 02/12/2017   Procedure: A/V Fistulagram;  Surgeon: Algernon Huxley, MD;  Location: [place] [facility];  Service: Cardiovascular;  Laterality: Left;  . A/V FISTULAGRAM Left 12/30/2017   Procedure: A/V FISTULAGRAM;  Surgeon: Algernon Huxley, MD;  Location: [place] [facility];  Service: Cardiovascular;  Laterality: Left;  . A/V SHUNT INTERVENTION N/A 02/12/2017   Procedure: A/V Shunt Intervention;  Surgeon: Algernon Huxley, MD;  Location: [place] [facility];  Service: Cardiovascular;  Laterality: N/A;  . AV FISTULA PLACEMENT    . BASCILIC VEIN TRANSPOSITION Left 10/30/2014   Procedure: LEFT BASCILIC VEIN TRANSPOSITION;  Surgeon: Rosetta Posner, MD;  Location: [place];  Service: Vascular;  Laterality: Left;  . BASCILIC VEIN TRANSPOSITION Left 01/03/2015   Procedure: LEFT ARM 2ND STAGE BASCILIC VEIN TRANSPOSITION;  Surgeon: Rosetta Posner, MD;  [facility];  Service: Vascular;  Laterality: Left;  . FRACTURE SURGERY     left foot,baby toe nad next toe missing  . INSERTION OF DIALYSIS CATHETER Right 10/30/2014   Procedure: INSERTION OF DIALYSIS CATHETER;  Surgeon: Rosetta Posner, MD;  Location: [place];  Service: Vascular;  Laterality: Right;  . KIDNEY TRANSPLANT  11/2008   [facility])  . WISDOM TOOTH EXTRACTION      Social History  Substance Use Topics  . Smoking status: Current Every Day Smoker    Packs/day: 0.50    Years: 27.00    Types: Cigarettes  . Smokeless tobacco: Never Used     Comment: 10 cigarettes a day  . Alcohol use No     Comment: occasional drinker noted in the past    Family History        Family History  Problem Relation Age of Onset  . Hypertension Mother   . Hypertension Father   . Diabetes Brother   . Deep vein thrombosis Brother   . Hypertension Sister   . Hyperlipidemia Sister   . Kidney disease Brother     on HD        Allergies  Allergen Reactions  . Penicillin G Itching and Rash  . Penicillins Itching and Rash     REVIEW OF SYSTEMS(Negative unless checked)  Constitutional: [] Weight loss[] Fever[] Chills Cardiac:[] Chest pain[] Chest pressure[] Palpitations [] Shortness of breath when laying flat [] Shortness of breath at rest [] Shortness of breath with exertion. Vascular: [] Pain in legs with walking[] Pain in legsat rest[] Pain in legs when laying flat [] Claudication [] Pain in feet when walking [] Pain in feet at rest [] Pain in feet when laying flat [] History of DVT [] Phlebitis [] Swelling in legs [] Varicose veins [x] Non-healing ulcers Pulmonary: [] Uses home oxygen [] Productive cough[] Hemoptysis [] Wheeze [] COPD [] Asthma Neurologic: [] Dizziness [] Blackouts [] Seizures [] History of stroke [] History of TIA[] Aphasia [] Temporary blindness[] Dysphagia [] Weaknessor numbness in arms [] Weakness or numbnessin legs Musculoskeletal: [] Arthritis [] Joint swelling [] Joint pain [] Low back pain Hematologic:[] Easy bruising[x] Easy bleeding [] Hypercoagulable state [] Anemic  Gastrointestinal:[] Blood in stool[] Vomiting blood[] Gastroesophageal reflux/heartburn[] Abdominal pain Genitourinary: [x] Chronic kidney disease [] Difficulturination [] Frequenturination [] Burning with urination[] Hematuria Skin: [] Rashes [x] Ulcers [x] Wounds Psychological: [] History of anxiety[] History of major depression.    Physical Examination  BP 135/84 (BP Location: Right Arm)   Pulse 92   Resp 16   Ht 5\' 6"  (1.676 m)   Wt 180 lb 9.6 oz (81.9 kg)   LMP 06/02/2015   BMI  29.15 kg/m  Gen:  WD/WN, NAD Head: [place]/AT, No temporalis wasting. Ear/Nose/Throat: Hearing grossly intact, nares w/o erythema or drainage Eyes: Conjunctiva clear. Sclera non-icteric Neck: Supple.  Trachea midline Pulmonary:  Good air movement, no use of accessory muscles.  Cardiac: RRR, no JVD Vascular: Access sites still aneurysmal but less prominent than prior to the intervention.  Tiny scab at the arterial access site.  No bleeding.  No erythema.  Good thrill is present Vessel Right Left  Radial Palpable Palpable                                    Musculoskeletal: M/S 5/5 throughout.  No deformity or atrophy.  Neurologic: Sensation grossly intact in extremities.  Symmetrical.  Speech is fluent.  Psychiatric: Judgment intact, Mood & affect appropriate for pt's clinical situation. Dermatologic: No rashes or ulcers noted.  No cellulitis or open wounds.       Labs Recent Results (from the past 2160 hour(s))  Potassium [facility] vascular lab only)  Status: Abnormal   Collection Time: 12/30/17 10:24 AM  Result Value Ref Range   Potassium [facility] vascular lab) 3.3 (L) 3.5 - 5.1    Comment: Performed at [facility], [address], [place], [place] [address]    Radiology No results found.  Assessment/Plan  Hypertension blood pressure control important in reducing the progression of atherosclerotic disease.  Blood pressure control would also slow the progression of aneurysmal degeneration of her access.  On appropriate oral medications.   ESRD on dialysis [facility]) Using her left arm AV fistula.  This is working significantly better after intervention.  Continue to monitor this and plan to check it with a duplex in 3 to 4 months.  Renal dialysis device, implant, or graft complication Access has some aneurysmal degeneration.  Multiple previous interventions required.  We discussed that at some point, she will likely need a surgical revision but the access is actually  doing better now after intervention.  Plan to recheck in 3 to 4 months    Leotis Pain, MD  02/02/2018 11:47 AM    This note was created with Dragon medical transcription system.  Any errors from dictation are purely unintentional

## 2018-02-02 NOTE — Assessment & Plan Note (Signed)
Using her left arm AV fistula.  This is working significantly better after intervention.  Continue to monitor this and plan to check it with a duplex in 3 to 4 months.

## 2018-02-02 NOTE — Assessment & Plan Note (Signed)
Access has some aneurysmal degeneration.  Multiple previous interventions required.  We discussed that at some point, she will likely need a surgical revision but the access is actually doing better now after intervention.  Plan to recheck in 3 to 4 months

## 2018-02-02 NOTE — Patient Instructions (Signed)
Vascular Access for Hemodialysis A vascular access is a connection between two blood vessels that allows blood to be easily removed from the body and returned to the body during hemodialysis. Hemodialysis is a procedure in which a machine outside of the body filters the blood. There are three types of vascular accesses:  Arteriovenous fistula. This is a connection between an artery and a vein (usually in the arm) that is made by sewing them together. Blood in the artery flows directly into the vein, causing it to get larger over time. This makes it easier for the vein to be used for hemodialysis. An arteriovenous fistula takes 1-6 months to develop after surgery.  Arteriovenous graft. This is a connection between an artery and a vein in the arm that is made with a tube. An arteriovenous graft can be used within 2-3 weeks of surgery.  Venous catheter. This is a thin, flexible tube that is placed in a large vein (usually in the neck, chest, or groin). A venous catheter for hemodialysis contains two tubes that come out of the skin. A venous catheter can be used right away. It is usually used as a temporary access if you need hemodialysis before a fistula or graft has developed. It may also be used as a permanent access if a fistula or graft cannot be created.  Which type of access is best for me? The type of access that is best for you depends on the size and strength of your veins. A fistula is usually the preferred type of access. It can last several years and is less likely than the other types of accesses to become infected or to cause blood clots within a blood vessel (thrombosis). However, a fistula is not an option for everyone. If your veins are not the right size, a graft may be used instead. Grafts require you to have strong veins. If your veins are not strong enough for a graft, a catheter may be used. Catheters are more likely than fistulas and grafts to become infected or to have  thrombosis. Sometimes, only one type of access is an option. Your health care provider will help you determine which type of access is best for you. How is a vascular access used? The way the access is used depends on the type of access:  If the access is a fistula or graft, two needles are inserted through the skin into the access before each hemodialysis session. Blood leaves the body through one of the needles and travels through a tube to the hemodialysis machine (dialyzer). It then flows through another tube and returns to the body through the second needle.  If the access is a catheter, one tube is connected directly to the tube that leads to the dialyzer and the other is connected to a tube that leads away from the dialyzer. Blood leaves the body through one tube and returns to the body through the other.  What kind of problems can occur with vascular accesses?  Blood clots within a blood vessel (thrombosis). Thrombosis can lead to a narrowing of a blood vessel or tube (stenosis). If thrombosis occurs frequently, another access site may be created as a backup.  Infection. These problems are most likely to occur with a venous catheter and least likely to occur with an arteriovenous fistula. How do I care for my vascular access? Wear a medical alert bracelet. This tells health care providers that you are a dialysis patient in the case of an emergency and   allows them to care for your veins appropriately. If you have a graft or fistula:  A "bruit" is a noise that is heard with a stethoscope and a "thrill" is a vibration felt over the graft or fistula. The presence of the bruit and thrill indicates that the access is working. You will be taught to feel for the thrill each day. If this is not felt, the access may be clotted. Call your health care provider.  You may use the arm where your vascular access is located freely after the site heals. Keep the following in mind: ? Avoid pressure on the  arm. ? Avoid lifting heavy objects with the arm. ? Avoid sleeping on the arm. ? Avoid wearing tight-sleeved shirts or jewelry around the graft or fistula.  Do not allow blood pressure monitoring or needle punctures on the side where the graft or fistula is located.  With permission from your health care provider, you may do exercises to help with blood flow through a fistula. These exercises involve squeezing a rubber ball or other soft objects as instructed.  Contact a health care provider if:  Chills develop.  You have an oral temperature above 102 F (38.9 C).  Swelling around the graft or fistula gets worse.  New pain develops.  Pus or other fluid (drainage) is seen at the vascular access site.  Skin redness or red streaking is seen on the skin around, above, or below the vascular access. Get help right away if:  Pain, numbness, or an unusual pale skin color develops in the hand on the side of your fistula.  Dizziness or weakness develops that you have not had before.  The vascular access has bleeding that cannot be easily controlled. This information is not intended to replace advice given to you by your health care provider. Make sure you discuss any questions you have with your health care provider. Document Released: 10/11/2002 Document Revised: 12/27/2015 Document Reviewed: 12/07/2012 Elsevier Interactive Patient Education  2017 [employer]  

## 2018-02-02 NOTE — Assessment & Plan Note (Signed)
blood pressure control important in reducing the progression of atherosclerotic disease.  Blood pressure control would also slow the progression of aneurysmal degeneration of her access.  On appropriate oral medications.

## 2018-02-03 DIAGNOSIS — N2581 Secondary hyperparathyroidism of renal origin: Secondary | ICD-10-CM | POA: Diagnosis not present

## 2018-02-03 DIAGNOSIS — E1129 Type 2 diabetes mellitus with other diabetic kidney complication: Secondary | ICD-10-CM | POA: Diagnosis not present

## 2018-02-03 DIAGNOSIS — N186 End stage renal disease: Secondary | ICD-10-CM | POA: Diagnosis not present

## 2018-02-05 DIAGNOSIS — N186 End stage renal disease: Secondary | ICD-10-CM | POA: Diagnosis not present

## 2018-02-05 DIAGNOSIS — E1129 Type 2 diabetes mellitus with other diabetic kidney complication: Secondary | ICD-10-CM | POA: Diagnosis not present

## 2018-02-05 DIAGNOSIS — N2581 Secondary hyperparathyroidism of renal origin: Secondary | ICD-10-CM | POA: Diagnosis not present

## 2018-02-08 DIAGNOSIS — N186 End stage renal disease: Secondary | ICD-10-CM | POA: Diagnosis not present

## 2018-02-08 DIAGNOSIS — E1129 Type 2 diabetes mellitus with other diabetic kidney complication: Secondary | ICD-10-CM | POA: Diagnosis not present

## 2018-02-08 DIAGNOSIS — N2581 Secondary hyperparathyroidism of renal origin: Secondary | ICD-10-CM | POA: Diagnosis not present

## 2018-02-10 ENCOUNTER — Other Ambulatory Visit (HOSPITAL_COMMUNITY): Payer: Self-pay | Admitting: Nephrology

## 2018-02-10 DIAGNOSIS — N186 End stage renal disease: Secondary | ICD-10-CM | POA: Diagnosis not present

## 2018-02-10 DIAGNOSIS — N2581 Secondary hyperparathyroidism of renal origin: Secondary | ICD-10-CM | POA: Diagnosis not present

## 2018-02-10 DIAGNOSIS — E1129 Type 2 diabetes mellitus with other diabetic kidney complication: Secondary | ICD-10-CM | POA: Diagnosis not present

## 2018-02-10 DIAGNOSIS — R0600 Dyspnea, unspecified: Secondary | ICD-10-CM

## 2018-02-11 ENCOUNTER — Ambulatory Visit (HOSPITAL_COMMUNITY)
Admission: RE | Admit: 2018-02-11 | Discharge: 2018-02-11 | Disposition: A | Discharge status: Home / Self Care | Payer: Medicare Other | Source: Ambulatory Visit | Attending: Nephrology | Admitting: Nephrology

## 2018-02-11 DIAGNOSIS — R0600 Dyspnea, unspecified: Secondary | ICD-10-CM | POA: Diagnosis not present

## 2018-02-11 DIAGNOSIS — I120 Hypertensive chronic kidney disease with stage 5 chronic kidney disease or end stage renal disease: Secondary | ICD-10-CM | POA: Diagnosis not present

## 2018-02-11 DIAGNOSIS — I071 Rheumatic tricuspid insufficiency: Secondary | ICD-10-CM | POA: Diagnosis not present

## 2018-02-11 DIAGNOSIS — N186 End stage renal disease: Secondary | ICD-10-CM | POA: Diagnosis not present

## 2018-02-11 DIAGNOSIS — I272 Pulmonary hypertension, unspecified: Secondary | ICD-10-CM | POA: Insufficient documentation

## 2018-02-11 DIAGNOSIS — R0609 Other forms of dyspnea: Secondary | ICD-10-CM | POA: Diagnosis not present

## 2018-02-11 NOTE — Progress Notes (Signed)
  Echocardiogram 2D Echocardiogram has been performed.  Ruth Gutierrez 02/11/2018, 3:39 PM

## 2018-02-12 ENCOUNTER — Other Ambulatory Visit (HOSPITAL_COMMUNITY): Payer: Medicare Other

## 2018-02-12 DIAGNOSIS — E1129 Type 2 diabetes mellitus with other diabetic kidney complication: Secondary | ICD-10-CM | POA: Diagnosis not present

## 2018-02-12 DIAGNOSIS — N186 End stage renal disease: Secondary | ICD-10-CM | POA: Diagnosis not present

## 2018-02-12 DIAGNOSIS — N2581 Secondary hyperparathyroidism of renal origin: Secondary | ICD-10-CM | POA: Diagnosis not present

## 2018-02-15 DIAGNOSIS — N2581 Secondary hyperparathyroidism of renal origin: Secondary | ICD-10-CM | POA: Diagnosis not present

## 2018-02-15 DIAGNOSIS — E1129 Type 2 diabetes mellitus with other diabetic kidney complication: Secondary | ICD-10-CM | POA: Diagnosis not present

## 2018-02-15 DIAGNOSIS — N186 End stage renal disease: Secondary | ICD-10-CM | POA: Diagnosis not present

## 2018-02-17 DIAGNOSIS — E1129 Type 2 diabetes mellitus with other diabetic kidney complication: Secondary | ICD-10-CM | POA: Diagnosis not present

## 2018-02-17 DIAGNOSIS — N186 End stage renal disease: Secondary | ICD-10-CM | POA: Diagnosis not present

## 2018-02-17 DIAGNOSIS — N2581 Secondary hyperparathyroidism of renal origin: Secondary | ICD-10-CM | POA: Diagnosis not present

## 2018-02-19 DIAGNOSIS — E1129 Type 2 diabetes mellitus with other diabetic kidney complication: Secondary | ICD-10-CM | POA: Diagnosis not present

## 2018-02-19 DIAGNOSIS — N2581 Secondary hyperparathyroidism of renal origin: Secondary | ICD-10-CM | POA: Diagnosis not present

## 2018-02-19 DIAGNOSIS — N186 End stage renal disease: Secondary | ICD-10-CM | POA: Diagnosis not present

## 2018-02-22 DIAGNOSIS — N2581 Secondary hyperparathyroidism of renal origin: Secondary | ICD-10-CM | POA: Diagnosis not present

## 2018-02-22 DIAGNOSIS — N186 End stage renal disease: Secondary | ICD-10-CM | POA: Diagnosis not present

## 2018-02-22 DIAGNOSIS — E1129 Type 2 diabetes mellitus with other diabetic kidney complication: Secondary | ICD-10-CM | POA: Diagnosis not present

## 2018-02-24 DIAGNOSIS — N186 End stage renal disease: Secondary | ICD-10-CM | POA: Diagnosis not present

## 2018-02-24 DIAGNOSIS — N2581 Secondary hyperparathyroidism of renal origin: Secondary | ICD-10-CM | POA: Diagnosis not present

## 2018-02-24 DIAGNOSIS — E1129 Type 2 diabetes mellitus with other diabetic kidney complication: Secondary | ICD-10-CM | POA: Diagnosis not present

## 2018-02-26 DIAGNOSIS — E1129 Type 2 diabetes mellitus with other diabetic kidney complication: Secondary | ICD-10-CM | POA: Diagnosis not present

## 2018-02-26 DIAGNOSIS — N2581 Secondary hyperparathyroidism of renal origin: Secondary | ICD-10-CM | POA: Diagnosis not present

## 2018-02-26 DIAGNOSIS — N186 End stage renal disease: Secondary | ICD-10-CM | POA: Diagnosis not present

## 2018-03-01 DIAGNOSIS — N186 End stage renal disease: Secondary | ICD-10-CM | POA: Diagnosis not present

## 2018-03-01 DIAGNOSIS — N2581 Secondary hyperparathyroidism of renal origin: Secondary | ICD-10-CM | POA: Diagnosis not present

## 2018-03-01 DIAGNOSIS — E1129 Type 2 diabetes mellitus with other diabetic kidney complication: Secondary | ICD-10-CM | POA: Diagnosis not present

## 2018-03-03 DIAGNOSIS — N2581 Secondary hyperparathyroidism of renal origin: Secondary | ICD-10-CM | POA: Diagnosis not present

## 2018-03-03 DIAGNOSIS — N186 End stage renal disease: Secondary | ICD-10-CM | POA: Diagnosis not present

## 2018-03-03 DIAGNOSIS — E1129 Type 2 diabetes mellitus with other diabetic kidney complication: Secondary | ICD-10-CM | POA: Diagnosis not present

## 2018-03-04 DIAGNOSIS — N186 End stage renal disease: Secondary | ICD-10-CM | POA: Diagnosis not present

## 2018-03-04 DIAGNOSIS — Z992 Dependence on renal dialysis: Secondary | ICD-10-CM | POA: Diagnosis not present

## 2018-03-04 DIAGNOSIS — I129 Hypertensive chronic kidney disease with stage 1 through stage 4 chronic kidney disease, or unspecified chronic kidney disease: Secondary | ICD-10-CM | POA: Diagnosis not present

## 2018-03-05 DIAGNOSIS — E1129 Type 2 diabetes mellitus with other diabetic kidney complication: Secondary | ICD-10-CM | POA: Diagnosis not present

## 2018-03-05 DIAGNOSIS — N186 End stage renal disease: Secondary | ICD-10-CM | POA: Diagnosis not present

## 2018-03-05 DIAGNOSIS — N2581 Secondary hyperparathyroidism of renal origin: Secondary | ICD-10-CM | POA: Diagnosis not present

## 2018-03-08 DIAGNOSIS — N2581 Secondary hyperparathyroidism of renal origin: Secondary | ICD-10-CM | POA: Diagnosis not present

## 2018-03-08 DIAGNOSIS — E1129 Type 2 diabetes mellitus with other diabetic kidney complication: Secondary | ICD-10-CM | POA: Diagnosis not present

## 2018-03-08 DIAGNOSIS — N186 End stage renal disease: Secondary | ICD-10-CM | POA: Diagnosis not present

## 2018-03-10 DIAGNOSIS — N2581 Secondary hyperparathyroidism of renal origin: Secondary | ICD-10-CM | POA: Diagnosis not present

## 2018-03-10 DIAGNOSIS — N186 End stage renal disease: Secondary | ICD-10-CM | POA: Diagnosis not present

## 2018-03-10 DIAGNOSIS — E1129 Type 2 diabetes mellitus with other diabetic kidney complication: Secondary | ICD-10-CM | POA: Diagnosis not present

## 2018-03-12 DIAGNOSIS — E1129 Type 2 diabetes mellitus with other diabetic kidney complication: Secondary | ICD-10-CM | POA: Diagnosis not present

## 2018-03-12 DIAGNOSIS — N2581 Secondary hyperparathyroidism of renal origin: Secondary | ICD-10-CM | POA: Diagnosis not present

## 2018-03-12 DIAGNOSIS — N186 End stage renal disease: Secondary | ICD-10-CM | POA: Diagnosis not present

## 2018-03-15 DIAGNOSIS — E1129 Type 2 diabetes mellitus with other diabetic kidney complication: Secondary | ICD-10-CM | POA: Diagnosis not present

## 2018-03-15 DIAGNOSIS — N2581 Secondary hyperparathyroidism of renal origin: Secondary | ICD-10-CM | POA: Diagnosis not present

## 2018-03-15 DIAGNOSIS — N186 End stage renal disease: Secondary | ICD-10-CM | POA: Diagnosis not present

## 2018-03-17 DIAGNOSIS — E1129 Type 2 diabetes mellitus with other diabetic kidney complication: Secondary | ICD-10-CM | POA: Diagnosis not present

## 2018-03-17 DIAGNOSIS — N2581 Secondary hyperparathyroidism of renal origin: Secondary | ICD-10-CM | POA: Diagnosis not present

## 2018-03-17 DIAGNOSIS — N186 End stage renal disease: Secondary | ICD-10-CM | POA: Diagnosis not present

## 2018-03-19 DIAGNOSIS — N186 End stage renal disease: Secondary | ICD-10-CM | POA: Diagnosis not present

## 2018-03-19 DIAGNOSIS — E1129 Type 2 diabetes mellitus with other diabetic kidney complication: Secondary | ICD-10-CM | POA: Diagnosis not present

## 2018-03-19 DIAGNOSIS — N2581 Secondary hyperparathyroidism of renal origin: Secondary | ICD-10-CM | POA: Diagnosis not present

## 2018-03-22 DIAGNOSIS — N186 End stage renal disease: Secondary | ICD-10-CM | POA: Diagnosis not present

## 2018-03-22 DIAGNOSIS — N2581 Secondary hyperparathyroidism of renal origin: Secondary | ICD-10-CM | POA: Diagnosis not present

## 2018-03-22 DIAGNOSIS — E1129 Type 2 diabetes mellitus with other diabetic kidney complication: Secondary | ICD-10-CM | POA: Diagnosis not present

## 2018-03-23 DIAGNOSIS — N2581 Secondary hyperparathyroidism of renal origin: Secondary | ICD-10-CM | POA: Diagnosis not present

## 2018-03-23 DIAGNOSIS — E1129 Type 2 diabetes mellitus with other diabetic kidney complication: Secondary | ICD-10-CM | POA: Diagnosis not present

## 2018-03-23 DIAGNOSIS — N186 End stage renal disease: Secondary | ICD-10-CM | POA: Diagnosis not present

## 2018-03-24 DIAGNOSIS — I151 Hypertension secondary to other renal disorders: Secondary | ICD-10-CM | POA: Diagnosis not present

## 2018-03-24 DIAGNOSIS — T8611 Kidney transplant rejection: Secondary | ICD-10-CM | POA: Diagnosis not present

## 2018-03-24 DIAGNOSIS — N186 End stage renal disease: Secondary | ICD-10-CM | POA: Diagnosis not present

## 2018-03-24 DIAGNOSIS — Z992 Dependence on renal dialysis: Secondary | ICD-10-CM | POA: Diagnosis not present

## 2018-03-24 DIAGNOSIS — E1122 Type 2 diabetes mellitus with diabetic chronic kidney disease: Secondary | ICD-10-CM | POA: Diagnosis not present

## 2018-03-26 DIAGNOSIS — N186 End stage renal disease: Secondary | ICD-10-CM | POA: Diagnosis not present

## 2018-03-26 DIAGNOSIS — E1129 Type 2 diabetes mellitus with other diabetic kidney complication: Secondary | ICD-10-CM | POA: Diagnosis not present

## 2018-03-26 DIAGNOSIS — N2581 Secondary hyperparathyroidism of renal origin: Secondary | ICD-10-CM | POA: Diagnosis not present

## 2018-03-29 DIAGNOSIS — N186 End stage renal disease: Secondary | ICD-10-CM | POA: Diagnosis not present

## 2018-03-29 DIAGNOSIS — E1129 Type 2 diabetes mellitus with other diabetic kidney complication: Secondary | ICD-10-CM | POA: Diagnosis not present

## 2018-03-29 DIAGNOSIS — N2581 Secondary hyperparathyroidism of renal origin: Secondary | ICD-10-CM | POA: Diagnosis not present

## 2018-03-31 DIAGNOSIS — N186 End stage renal disease: Secondary | ICD-10-CM | POA: Diagnosis not present

## 2018-03-31 DIAGNOSIS — N2581 Secondary hyperparathyroidism of renal origin: Secondary | ICD-10-CM | POA: Diagnosis not present

## 2018-03-31 DIAGNOSIS — E1129 Type 2 diabetes mellitus with other diabetic kidney complication: Secondary | ICD-10-CM | POA: Diagnosis not present

## 2018-04-01 DIAGNOSIS — Z01818 Encounter for other preprocedural examination: Secondary | ICD-10-CM | POA: Diagnosis not present

## 2018-04-01 DIAGNOSIS — I517 Cardiomegaly: Secondary | ICD-10-CM | POA: Diagnosis not present

## 2018-04-01 DIAGNOSIS — N186 End stage renal disease: Secondary | ICD-10-CM | POA: Diagnosis not present

## 2018-04-01 DIAGNOSIS — E1122 Type 2 diabetes mellitus with diabetic chronic kidney disease: Secondary | ICD-10-CM | POA: Diagnosis not present

## 2018-04-01 DIAGNOSIS — Z992 Dependence on renal dialysis: Secondary | ICD-10-CM | POA: Diagnosis not present

## 2018-04-01 DIAGNOSIS — I120 Hypertensive chronic kidney disease with stage 5 chronic kidney disease or end stage renal disease: Secondary | ICD-10-CM | POA: Diagnosis not present

## 2018-04-01 DIAGNOSIS — R000 Tachycardia, unspecified: Secondary | ICD-10-CM | POA: Diagnosis not present

## 2018-04-01 DIAGNOSIS — R9431 Abnormal electrocardiogram [ECG] [EKG]: Secondary | ICD-10-CM | POA: Diagnosis not present

## 2018-04-02 DIAGNOSIS — E1129 Type 2 diabetes mellitus with other diabetic kidney complication: Secondary | ICD-10-CM | POA: Diagnosis not present

## 2018-04-02 DIAGNOSIS — N2581 Secondary hyperparathyroidism of renal origin: Secondary | ICD-10-CM | POA: Diagnosis not present

## 2018-04-02 DIAGNOSIS — N186 End stage renal disease: Secondary | ICD-10-CM | POA: Diagnosis not present

## 2018-04-04 DIAGNOSIS — N186 End stage renal disease: Secondary | ICD-10-CM | POA: Diagnosis not present

## 2018-04-04 DIAGNOSIS — Z992 Dependence on renal dialysis: Secondary | ICD-10-CM | POA: Diagnosis not present

## 2018-04-04 DIAGNOSIS — I129 Hypertensive chronic kidney disease with stage 1 through stage 4 chronic kidney disease, or unspecified chronic kidney disease: Secondary | ICD-10-CM | POA: Diagnosis not present

## 2018-04-05 DIAGNOSIS — E1129 Type 2 diabetes mellitus with other diabetic kidney complication: Secondary | ICD-10-CM | POA: Diagnosis not present

## 2018-04-05 DIAGNOSIS — N2581 Secondary hyperparathyroidism of renal origin: Secondary | ICD-10-CM | POA: Diagnosis not present

## 2018-04-05 DIAGNOSIS — Z23 Encounter for immunization: Secondary | ICD-10-CM | POA: Diagnosis not present

## 2018-04-05 DIAGNOSIS — N186 End stage renal disease: Secondary | ICD-10-CM | POA: Diagnosis not present

## 2018-04-06 DIAGNOSIS — Z940 Kidney transplant status: Secondary | ICD-10-CM | POA: Diagnosis not present

## 2018-04-06 DIAGNOSIS — Z992 Dependence on renal dialysis: Secondary | ICD-10-CM | POA: Diagnosis not present

## 2018-04-06 DIAGNOSIS — N185 Chronic kidney disease, stage 5: Secondary | ICD-10-CM | POA: Diagnosis not present

## 2018-04-06 DIAGNOSIS — I120 Hypertensive chronic kidney disease with stage 5 chronic kidney disease or end stage renal disease: Secondary | ICD-10-CM | POA: Diagnosis not present

## 2018-04-06 DIAGNOSIS — F17210 Nicotine dependence, cigarettes, uncomplicated: Secondary | ICD-10-CM | POA: Diagnosis not present

## 2018-04-06 DIAGNOSIS — N186 End stage renal disease: Secondary | ICD-10-CM | POA: Diagnosis not present

## 2018-04-06 DIAGNOSIS — Z4902 Encounter for fitting and adjustment of peritoneal dialysis catheter: Secondary | ICD-10-CM | POA: Diagnosis not present

## 2018-04-07 DIAGNOSIS — E1129 Type 2 diabetes mellitus with other diabetic kidney complication: Secondary | ICD-10-CM | POA: Diagnosis not present

## 2018-04-07 DIAGNOSIS — N2581 Secondary hyperparathyroidism of renal origin: Secondary | ICD-10-CM | POA: Diagnosis not present

## 2018-04-07 DIAGNOSIS — Z23 Encounter for immunization: Secondary | ICD-10-CM | POA: Diagnosis not present

## 2018-04-07 DIAGNOSIS — N186 End stage renal disease: Secondary | ICD-10-CM | POA: Diagnosis not present

## 2018-04-09 DIAGNOSIS — E1129 Type 2 diabetes mellitus with other diabetic kidney complication: Secondary | ICD-10-CM | POA: Diagnosis not present

## 2018-04-09 DIAGNOSIS — N186 End stage renal disease: Secondary | ICD-10-CM | POA: Diagnosis not present

## 2018-04-09 DIAGNOSIS — Z23 Encounter for immunization: Secondary | ICD-10-CM | POA: Diagnosis not present

## 2018-04-09 DIAGNOSIS — N2581 Secondary hyperparathyroidism of renal origin: Secondary | ICD-10-CM | POA: Diagnosis not present

## 2018-04-12 DIAGNOSIS — N2581 Secondary hyperparathyroidism of renal origin: Secondary | ICD-10-CM | POA: Diagnosis not present

## 2018-04-12 DIAGNOSIS — N186 End stage renal disease: Secondary | ICD-10-CM | POA: Diagnosis not present

## 2018-04-12 DIAGNOSIS — E1129 Type 2 diabetes mellitus with other diabetic kidney complication: Secondary | ICD-10-CM | POA: Diagnosis not present

## 2018-04-12 DIAGNOSIS — Z23 Encounter for immunization: Secondary | ICD-10-CM | POA: Diagnosis not present

## 2018-04-13 ENCOUNTER — Ambulatory Visit (INDEPENDENT_AMBULATORY_CARE_PROVIDER_SITE_OTHER): Payer: Medicare Other | Admitting: Vascular Surgery

## 2018-04-13 ENCOUNTER — Encounter (INDEPENDENT_AMBULATORY_CARE_PROVIDER_SITE_OTHER): Payer: Medicare Other

## 2018-04-14 DIAGNOSIS — Z23 Encounter for immunization: Secondary | ICD-10-CM | POA: Diagnosis not present

## 2018-04-14 DIAGNOSIS — E1129 Type 2 diabetes mellitus with other diabetic kidney complication: Secondary | ICD-10-CM | POA: Diagnosis not present

## 2018-04-14 DIAGNOSIS — N186 End stage renal disease: Secondary | ICD-10-CM | POA: Diagnosis not present

## 2018-04-14 DIAGNOSIS — N2581 Secondary hyperparathyroidism of renal origin: Secondary | ICD-10-CM | POA: Diagnosis not present

## 2018-04-16 DIAGNOSIS — N2581 Secondary hyperparathyroidism of renal origin: Secondary | ICD-10-CM | POA: Diagnosis not present

## 2018-04-16 DIAGNOSIS — Z23 Encounter for immunization: Secondary | ICD-10-CM | POA: Diagnosis not present

## 2018-04-16 DIAGNOSIS — E1129 Type 2 diabetes mellitus with other diabetic kidney complication: Secondary | ICD-10-CM | POA: Diagnosis not present

## 2018-04-16 DIAGNOSIS — N186 End stage renal disease: Secondary | ICD-10-CM | POA: Diagnosis not present

## 2018-04-19 DIAGNOSIS — N186 End stage renal disease: Secondary | ICD-10-CM | POA: Diagnosis not present

## 2018-04-19 DIAGNOSIS — Z23 Encounter for immunization: Secondary | ICD-10-CM | POA: Diagnosis not present

## 2018-04-19 DIAGNOSIS — N2581 Secondary hyperparathyroidism of renal origin: Secondary | ICD-10-CM | POA: Diagnosis not present

## 2018-04-19 DIAGNOSIS — E1129 Type 2 diabetes mellitus with other diabetic kidney complication: Secondary | ICD-10-CM | POA: Diagnosis not present

## 2018-04-20 DIAGNOSIS — N186 End stage renal disease: Secondary | ICD-10-CM | POA: Diagnosis not present

## 2018-04-20 DIAGNOSIS — Z23 Encounter for immunization: Secondary | ICD-10-CM | POA: Diagnosis not present

## 2018-04-20 DIAGNOSIS — N2581 Secondary hyperparathyroidism of renal origin: Secondary | ICD-10-CM | POA: Diagnosis not present

## 2018-04-20 DIAGNOSIS — E1129 Type 2 diabetes mellitus with other diabetic kidney complication: Secondary | ICD-10-CM | POA: Diagnosis not present

## 2018-04-23 DIAGNOSIS — N186 End stage renal disease: Secondary | ICD-10-CM | POA: Diagnosis not present

## 2018-04-23 DIAGNOSIS — N2581 Secondary hyperparathyroidism of renal origin: Secondary | ICD-10-CM | POA: Diagnosis not present

## 2018-04-23 DIAGNOSIS — E1129 Type 2 diabetes mellitus with other diabetic kidney complication: Secondary | ICD-10-CM | POA: Diagnosis not present

## 2018-04-23 DIAGNOSIS — Z23 Encounter for immunization: Secondary | ICD-10-CM | POA: Diagnosis not present

## 2018-04-26 DIAGNOSIS — E1129 Type 2 diabetes mellitus with other diabetic kidney complication: Secondary | ICD-10-CM | POA: Diagnosis not present

## 2018-04-26 DIAGNOSIS — Z23 Encounter for immunization: Secondary | ICD-10-CM | POA: Diagnosis not present

## 2018-04-26 DIAGNOSIS — N2581 Secondary hyperparathyroidism of renal origin: Secondary | ICD-10-CM | POA: Diagnosis not present

## 2018-04-26 DIAGNOSIS — N186 End stage renal disease: Secondary | ICD-10-CM | POA: Diagnosis not present

## 2018-04-28 DIAGNOSIS — N2581 Secondary hyperparathyroidism of renal origin: Secondary | ICD-10-CM | POA: Diagnosis not present

## 2018-04-28 DIAGNOSIS — N186 End stage renal disease: Secondary | ICD-10-CM | POA: Diagnosis not present

## 2018-04-28 DIAGNOSIS — E1129 Type 2 diabetes mellitus with other diabetic kidney complication: Secondary | ICD-10-CM | POA: Diagnosis not present

## 2018-04-28 DIAGNOSIS — Z23 Encounter for immunization: Secondary | ICD-10-CM | POA: Diagnosis not present

## 2018-04-29 DIAGNOSIS — N2589 Other disorders resulting from impaired renal tubular function: Secondary | ICD-10-CM | POA: Insufficient documentation

## 2018-04-29 DIAGNOSIS — D513 Other dietary vitamin B12 deficiency anemia: Secondary | ICD-10-CM | POA: Insufficient documentation

## 2018-04-29 DIAGNOSIS — D631 Anemia in chronic kidney disease: Secondary | ICD-10-CM | POA: Insufficient documentation

## 2018-04-29 DIAGNOSIS — E7849 Other hyperlipidemia: Secondary | ICD-10-CM | POA: Insufficient documentation

## 2018-04-29 DIAGNOSIS — R17 Unspecified jaundice: Secondary | ICD-10-CM | POA: Insufficient documentation

## 2018-04-29 DIAGNOSIS — E878 Other disorders of electrolyte and fluid balance, not elsewhere classified: Secondary | ICD-10-CM | POA: Insufficient documentation

## 2018-04-29 DIAGNOSIS — Z79899 Other long term (current) drug therapy: Secondary | ICD-10-CM | POA: Insufficient documentation

## 2018-04-29 DIAGNOSIS — E440 Moderate protein-calorie malnutrition: Secondary | ICD-10-CM | POA: Insufficient documentation

## 2018-04-29 DIAGNOSIS — K769 Liver disease, unspecified: Secondary | ICD-10-CM | POA: Insufficient documentation

## 2018-04-29 DIAGNOSIS — R82998 Other abnormal findings in urine: Secondary | ICD-10-CM | POA: Insufficient documentation

## 2018-04-30 DIAGNOSIS — E1129 Type 2 diabetes mellitus with other diabetic kidney complication: Secondary | ICD-10-CM | POA: Diagnosis not present

## 2018-04-30 DIAGNOSIS — N186 End stage renal disease: Secondary | ICD-10-CM | POA: Diagnosis not present

## 2018-04-30 DIAGNOSIS — Z23 Encounter for immunization: Secondary | ICD-10-CM | POA: Diagnosis not present

## 2018-04-30 DIAGNOSIS — N2581 Secondary hyperparathyroidism of renal origin: Secondary | ICD-10-CM | POA: Diagnosis not present

## 2018-05-03 DIAGNOSIS — N186 End stage renal disease: Secondary | ICD-10-CM | POA: Diagnosis not present

## 2018-05-03 DIAGNOSIS — Z4932 Encounter for adequacy testing for peritoneal dialysis: Secondary | ICD-10-CM | POA: Diagnosis not present

## 2018-05-03 DIAGNOSIS — D513 Other dietary vitamin B12 deficiency anemia: Secondary | ICD-10-CM | POA: Diagnosis not present

## 2018-05-03 DIAGNOSIS — N2581 Secondary hyperparathyroidism of renal origin: Secondary | ICD-10-CM | POA: Diagnosis not present

## 2018-05-03 DIAGNOSIS — K769 Liver disease, unspecified: Secondary | ICD-10-CM | POA: Diagnosis not present

## 2018-05-03 DIAGNOSIS — N2589 Other disorders resulting from impaired renal tubular function: Secondary | ICD-10-CM | POA: Diagnosis not present

## 2018-05-03 DIAGNOSIS — D509 Iron deficiency anemia, unspecified: Secondary | ICD-10-CM | POA: Diagnosis not present

## 2018-05-03 DIAGNOSIS — E7849 Other hyperlipidemia: Secondary | ICD-10-CM | POA: Diagnosis not present

## 2018-05-03 DIAGNOSIS — R82998 Other abnormal findings in urine: Secondary | ICD-10-CM | POA: Diagnosis not present

## 2018-05-03 DIAGNOSIS — D631 Anemia in chronic kidney disease: Secondary | ICD-10-CM | POA: Diagnosis not present

## 2018-05-04 DIAGNOSIS — Z4932 Encounter for adequacy testing for peritoneal dialysis: Secondary | ICD-10-CM | POA: Diagnosis not present

## 2018-05-04 DIAGNOSIS — N186 End stage renal disease: Secondary | ICD-10-CM | POA: Diagnosis not present

## 2018-05-04 DIAGNOSIS — D631 Anemia in chronic kidney disease: Secondary | ICD-10-CM | POA: Diagnosis not present

## 2018-05-04 DIAGNOSIS — I129 Hypertensive chronic kidney disease with stage 1 through stage 4 chronic kidney disease, or unspecified chronic kidney disease: Secondary | ICD-10-CM | POA: Diagnosis not present

## 2018-05-04 DIAGNOSIS — D509 Iron deficiency anemia, unspecified: Secondary | ICD-10-CM | POA: Diagnosis not present

## 2018-05-04 DIAGNOSIS — N2589 Other disorders resulting from impaired renal tubular function: Secondary | ICD-10-CM | POA: Diagnosis not present

## 2018-05-04 DIAGNOSIS — N2581 Secondary hyperparathyroidism of renal origin: Secondary | ICD-10-CM | POA: Diagnosis not present

## 2018-05-04 DIAGNOSIS — Z992 Dependence on renal dialysis: Secondary | ICD-10-CM | POA: Diagnosis not present

## 2018-05-04 DIAGNOSIS — K769 Liver disease, unspecified: Secondary | ICD-10-CM | POA: Diagnosis not present

## 2018-05-04 DIAGNOSIS — R7611 Nonspecific reaction to tuberculin skin test without active tuberculosis: Secondary | ICD-10-CM | POA: Insufficient documentation

## 2018-05-05 DIAGNOSIS — D631 Anemia in chronic kidney disease: Secondary | ICD-10-CM | POA: Diagnosis not present

## 2018-05-05 DIAGNOSIS — D509 Iron deficiency anemia, unspecified: Secondary | ICD-10-CM | POA: Diagnosis not present

## 2018-05-05 DIAGNOSIS — K769 Liver disease, unspecified: Secondary | ICD-10-CM | POA: Diagnosis not present

## 2018-05-05 DIAGNOSIS — N2589 Other disorders resulting from impaired renal tubular function: Secondary | ICD-10-CM | POA: Diagnosis not present

## 2018-05-05 DIAGNOSIS — N2581 Secondary hyperparathyroidism of renal origin: Secondary | ICD-10-CM | POA: Diagnosis not present

## 2018-05-05 DIAGNOSIS — N186 End stage renal disease: Secondary | ICD-10-CM | POA: Diagnosis not present

## 2018-05-06 DIAGNOSIS — D631 Anemia in chronic kidney disease: Secondary | ICD-10-CM | POA: Diagnosis not present

## 2018-05-06 DIAGNOSIS — N2589 Other disorders resulting from impaired renal tubular function: Secondary | ICD-10-CM | POA: Diagnosis not present

## 2018-05-06 DIAGNOSIS — D509 Iron deficiency anemia, unspecified: Secondary | ICD-10-CM | POA: Diagnosis not present

## 2018-05-06 DIAGNOSIS — N2581 Secondary hyperparathyroidism of renal origin: Secondary | ICD-10-CM | POA: Diagnosis not present

## 2018-05-06 DIAGNOSIS — K769 Liver disease, unspecified: Secondary | ICD-10-CM | POA: Diagnosis not present

## 2018-05-06 DIAGNOSIS — N186 End stage renal disease: Secondary | ICD-10-CM | POA: Diagnosis not present

## 2018-05-07 DIAGNOSIS — N2581 Secondary hyperparathyroidism of renal origin: Secondary | ICD-10-CM | POA: Diagnosis not present

## 2018-05-07 DIAGNOSIS — N2589 Other disorders resulting from impaired renal tubular function: Secondary | ICD-10-CM | POA: Diagnosis not present

## 2018-05-07 DIAGNOSIS — D631 Anemia in chronic kidney disease: Secondary | ICD-10-CM | POA: Diagnosis not present

## 2018-05-07 DIAGNOSIS — D509 Iron deficiency anemia, unspecified: Secondary | ICD-10-CM | POA: Diagnosis not present

## 2018-05-07 DIAGNOSIS — K769 Liver disease, unspecified: Secondary | ICD-10-CM | POA: Diagnosis not present

## 2018-05-07 DIAGNOSIS — N186 End stage renal disease: Secondary | ICD-10-CM | POA: Diagnosis not present

## 2018-05-10 DIAGNOSIS — E7849 Other hyperlipidemia: Secondary | ICD-10-CM | POA: Diagnosis not present

## 2018-05-10 DIAGNOSIS — D631 Anemia in chronic kidney disease: Secondary | ICD-10-CM | POA: Diagnosis not present

## 2018-05-10 DIAGNOSIS — D509 Iron deficiency anemia, unspecified: Secondary | ICD-10-CM | POA: Diagnosis not present

## 2018-05-10 DIAGNOSIS — K769 Liver disease, unspecified: Secondary | ICD-10-CM | POA: Diagnosis not present

## 2018-05-10 DIAGNOSIS — R82998 Other abnormal findings in urine: Secondary | ICD-10-CM | POA: Diagnosis not present

## 2018-05-10 DIAGNOSIS — N2589 Other disorders resulting from impaired renal tubular function: Secondary | ICD-10-CM | POA: Diagnosis not present

## 2018-05-10 DIAGNOSIS — N2581 Secondary hyperparathyroidism of renal origin: Secondary | ICD-10-CM | POA: Diagnosis not present

## 2018-05-10 DIAGNOSIS — N186 End stage renal disease: Secondary | ICD-10-CM | POA: Diagnosis not present

## 2018-05-10 DIAGNOSIS — E1129 Type 2 diabetes mellitus with other diabetic kidney complication: Secondary | ICD-10-CM | POA: Diagnosis not present

## 2018-05-11 DIAGNOSIS — N2589 Other disorders resulting from impaired renal tubular function: Secondary | ICD-10-CM | POA: Diagnosis not present

## 2018-05-11 DIAGNOSIS — K769 Liver disease, unspecified: Secondary | ICD-10-CM | POA: Diagnosis not present

## 2018-05-11 DIAGNOSIS — D509 Iron deficiency anemia, unspecified: Secondary | ICD-10-CM | POA: Diagnosis not present

## 2018-05-11 DIAGNOSIS — D631 Anemia in chronic kidney disease: Secondary | ICD-10-CM | POA: Diagnosis not present

## 2018-05-11 DIAGNOSIS — N186 End stage renal disease: Secondary | ICD-10-CM | POA: Diagnosis not present

## 2018-05-11 DIAGNOSIS — N2581 Secondary hyperparathyroidism of renal origin: Secondary | ICD-10-CM | POA: Diagnosis not present

## 2018-05-12 DIAGNOSIS — K769 Liver disease, unspecified: Secondary | ICD-10-CM | POA: Diagnosis not present

## 2018-05-12 DIAGNOSIS — T8610 Unspecified complication of kidney transplant: Secondary | ICD-10-CM | POA: Diagnosis not present

## 2018-05-12 DIAGNOSIS — D509 Iron deficiency anemia, unspecified: Secondary | ICD-10-CM | POA: Diagnosis not present

## 2018-05-12 DIAGNOSIS — Z992 Dependence on renal dialysis: Secondary | ICD-10-CM | POA: Diagnosis not present

## 2018-05-12 DIAGNOSIS — N2589 Other disorders resulting from impaired renal tubular function: Secondary | ICD-10-CM | POA: Diagnosis not present

## 2018-05-12 DIAGNOSIS — D631 Anemia in chronic kidney disease: Secondary | ICD-10-CM | POA: Diagnosis not present

## 2018-05-12 DIAGNOSIS — N186 End stage renal disease: Secondary | ICD-10-CM | POA: Diagnosis not present

## 2018-05-12 DIAGNOSIS — N2581 Secondary hyperparathyroidism of renal origin: Secondary | ICD-10-CM | POA: Diagnosis not present

## 2018-05-13 DIAGNOSIS — N186 End stage renal disease: Secondary | ICD-10-CM | POA: Diagnosis not present

## 2018-05-13 DIAGNOSIS — N2581 Secondary hyperparathyroidism of renal origin: Secondary | ICD-10-CM | POA: Diagnosis not present

## 2018-05-14 DIAGNOSIS — N186 End stage renal disease: Secondary | ICD-10-CM | POA: Diagnosis not present

## 2018-05-14 DIAGNOSIS — N2581 Secondary hyperparathyroidism of renal origin: Secondary | ICD-10-CM | POA: Diagnosis not present

## 2018-05-15 DIAGNOSIS — N2581 Secondary hyperparathyroidism of renal origin: Secondary | ICD-10-CM | POA: Diagnosis not present

## 2018-05-15 DIAGNOSIS — N186 End stage renal disease: Secondary | ICD-10-CM | POA: Diagnosis not present

## 2018-05-16 DIAGNOSIS — N2581 Secondary hyperparathyroidism of renal origin: Secondary | ICD-10-CM | POA: Diagnosis not present

## 2018-05-16 DIAGNOSIS — N186 End stage renal disease: Secondary | ICD-10-CM | POA: Diagnosis not present

## 2018-05-17 DIAGNOSIS — N2581 Secondary hyperparathyroidism of renal origin: Secondary | ICD-10-CM | POA: Diagnosis not present

## 2018-05-17 DIAGNOSIS — N186 End stage renal disease: Secondary | ICD-10-CM | POA: Diagnosis not present

## 2018-05-18 DIAGNOSIS — N186 End stage renal disease: Secondary | ICD-10-CM | POA: Diagnosis not present

## 2018-05-18 DIAGNOSIS — N2581 Secondary hyperparathyroidism of renal origin: Secondary | ICD-10-CM | POA: Diagnosis not present

## 2018-05-19 DIAGNOSIS — N2581 Secondary hyperparathyroidism of renal origin: Secondary | ICD-10-CM | POA: Diagnosis not present

## 2018-05-19 DIAGNOSIS — N186 End stage renal disease: Secondary | ICD-10-CM | POA: Diagnosis not present

## 2018-05-20 DIAGNOSIS — N2581 Secondary hyperparathyroidism of renal origin: Secondary | ICD-10-CM | POA: Diagnosis not present

## 2018-05-20 DIAGNOSIS — N186 End stage renal disease: Secondary | ICD-10-CM | POA: Diagnosis not present

## 2018-05-21 DIAGNOSIS — N2581 Secondary hyperparathyroidism of renal origin: Secondary | ICD-10-CM | POA: Diagnosis not present

## 2018-05-21 DIAGNOSIS — N186 End stage renal disease: Secondary | ICD-10-CM | POA: Diagnosis not present

## 2018-05-22 DIAGNOSIS — N186 End stage renal disease: Secondary | ICD-10-CM | POA: Diagnosis not present

## 2018-05-22 DIAGNOSIS — N2581 Secondary hyperparathyroidism of renal origin: Secondary | ICD-10-CM | POA: Diagnosis not present

## 2018-05-23 DIAGNOSIS — N2581 Secondary hyperparathyroidism of renal origin: Secondary | ICD-10-CM | POA: Diagnosis not present

## 2018-05-23 DIAGNOSIS — N186 End stage renal disease: Secondary | ICD-10-CM | POA: Diagnosis not present

## 2018-05-24 DIAGNOSIS — N2581 Secondary hyperparathyroidism of renal origin: Secondary | ICD-10-CM | POA: Diagnosis not present

## 2018-05-24 DIAGNOSIS — N186 End stage renal disease: Secondary | ICD-10-CM | POA: Diagnosis not present

## 2018-05-25 DIAGNOSIS — N2581 Secondary hyperparathyroidism of renal origin: Secondary | ICD-10-CM | POA: Diagnosis not present

## 2018-05-25 DIAGNOSIS — N186 End stage renal disease: Secondary | ICD-10-CM | POA: Diagnosis not present

## 2018-05-26 DIAGNOSIS — N2581 Secondary hyperparathyroidism of renal origin: Secondary | ICD-10-CM | POA: Diagnosis not present

## 2018-05-26 DIAGNOSIS — N186 End stage renal disease: Secondary | ICD-10-CM | POA: Diagnosis not present

## 2018-05-27 DIAGNOSIS — N186 End stage renal disease: Secondary | ICD-10-CM | POA: Diagnosis not present

## 2018-05-27 DIAGNOSIS — N2581 Secondary hyperparathyroidism of renal origin: Secondary | ICD-10-CM | POA: Diagnosis not present

## 2018-05-28 DIAGNOSIS — N186 End stage renal disease: Secondary | ICD-10-CM | POA: Diagnosis not present

## 2018-05-28 DIAGNOSIS — N2581 Secondary hyperparathyroidism of renal origin: Secondary | ICD-10-CM | POA: Diagnosis not present

## 2018-05-29 DIAGNOSIS — N186 End stage renal disease: Secondary | ICD-10-CM | POA: Diagnosis not present

## 2018-05-29 DIAGNOSIS — N2581 Secondary hyperparathyroidism of renal origin: Secondary | ICD-10-CM | POA: Diagnosis not present

## 2018-05-30 DIAGNOSIS — N186 End stage renal disease: Secondary | ICD-10-CM | POA: Diagnosis not present

## 2018-05-30 DIAGNOSIS — N2581 Secondary hyperparathyroidism of renal origin: Secondary | ICD-10-CM | POA: Diagnosis not present

## 2018-05-31 DIAGNOSIS — N186 End stage renal disease: Secondary | ICD-10-CM | POA: Diagnosis not present

## 2018-05-31 DIAGNOSIS — N2581 Secondary hyperparathyroidism of renal origin: Secondary | ICD-10-CM | POA: Diagnosis not present

## 2018-06-01 DIAGNOSIS — N2581 Secondary hyperparathyroidism of renal origin: Secondary | ICD-10-CM | POA: Diagnosis not present

## 2018-06-01 DIAGNOSIS — N186 End stage renal disease: Secondary | ICD-10-CM | POA: Diagnosis not present

## 2018-06-02 DIAGNOSIS — N2581 Secondary hyperparathyroidism of renal origin: Secondary | ICD-10-CM | POA: Diagnosis not present

## 2018-06-02 DIAGNOSIS — N186 End stage renal disease: Secondary | ICD-10-CM | POA: Diagnosis not present

## 2018-06-03 DIAGNOSIS — N186 End stage renal disease: Secondary | ICD-10-CM | POA: Diagnosis not present

## 2018-06-03 DIAGNOSIS — N2581 Secondary hyperparathyroidism of renal origin: Secondary | ICD-10-CM | POA: Diagnosis not present

## 2018-06-04 DIAGNOSIS — T8610 Unspecified complication of kidney transplant: Secondary | ICD-10-CM | POA: Diagnosis not present

## 2018-06-04 DIAGNOSIS — Z4932 Encounter for adequacy testing for peritoneal dialysis: Secondary | ICD-10-CM | POA: Diagnosis not present

## 2018-06-04 DIAGNOSIS — Z992 Dependence on renal dialysis: Secondary | ICD-10-CM | POA: Diagnosis not present

## 2018-06-04 DIAGNOSIS — R17 Unspecified jaundice: Secondary | ICD-10-CM | POA: Diagnosis not present

## 2018-06-04 DIAGNOSIS — K769 Liver disease, unspecified: Secondary | ICD-10-CM | POA: Diagnosis not present

## 2018-06-04 DIAGNOSIS — N186 End stage renal disease: Secondary | ICD-10-CM | POA: Diagnosis not present

## 2018-06-04 DIAGNOSIS — D509 Iron deficiency anemia, unspecified: Secondary | ICD-10-CM | POA: Diagnosis not present

## 2018-06-04 DIAGNOSIS — E440 Moderate protein-calorie malnutrition: Secondary | ICD-10-CM | POA: Diagnosis not present

## 2018-06-04 DIAGNOSIS — N2581 Secondary hyperparathyroidism of renal origin: Secondary | ICD-10-CM | POA: Diagnosis not present

## 2018-06-04 DIAGNOSIS — Z79899 Other long term (current) drug therapy: Secondary | ICD-10-CM | POA: Diagnosis not present

## 2018-06-04 DIAGNOSIS — D631 Anemia in chronic kidney disease: Secondary | ICD-10-CM | POA: Diagnosis not present

## 2018-06-05 DIAGNOSIS — N186 End stage renal disease: Secondary | ICD-10-CM | POA: Diagnosis not present

## 2018-06-05 DIAGNOSIS — E440 Moderate protein-calorie malnutrition: Secondary | ICD-10-CM | POA: Diagnosis not present

## 2018-06-05 DIAGNOSIS — D509 Iron deficiency anemia, unspecified: Secondary | ICD-10-CM | POA: Diagnosis not present

## 2018-06-05 DIAGNOSIS — D631 Anemia in chronic kidney disease: Secondary | ICD-10-CM | POA: Diagnosis not present

## 2018-06-05 DIAGNOSIS — N2581 Secondary hyperparathyroidism of renal origin: Secondary | ICD-10-CM | POA: Diagnosis not present

## 2018-06-05 DIAGNOSIS — Z4932 Encounter for adequacy testing for peritoneal dialysis: Secondary | ICD-10-CM | POA: Diagnosis not present

## 2018-06-06 DIAGNOSIS — E440 Moderate protein-calorie malnutrition: Secondary | ICD-10-CM | POA: Diagnosis not present

## 2018-06-06 DIAGNOSIS — D509 Iron deficiency anemia, unspecified: Secondary | ICD-10-CM | POA: Diagnosis not present

## 2018-06-06 DIAGNOSIS — N2581 Secondary hyperparathyroidism of renal origin: Secondary | ICD-10-CM | POA: Diagnosis not present

## 2018-06-06 DIAGNOSIS — Z4932 Encounter for adequacy testing for peritoneal dialysis: Secondary | ICD-10-CM | POA: Diagnosis not present

## 2018-06-06 DIAGNOSIS — D631 Anemia in chronic kidney disease: Secondary | ICD-10-CM | POA: Diagnosis not present

## 2018-06-06 DIAGNOSIS — N186 End stage renal disease: Secondary | ICD-10-CM | POA: Diagnosis not present

## 2018-06-07 DIAGNOSIS — N2581 Secondary hyperparathyroidism of renal origin: Secondary | ICD-10-CM | POA: Diagnosis not present

## 2018-06-07 DIAGNOSIS — Z4932 Encounter for adequacy testing for peritoneal dialysis: Secondary | ICD-10-CM | POA: Diagnosis not present

## 2018-06-07 DIAGNOSIS — D509 Iron deficiency anemia, unspecified: Secondary | ICD-10-CM | POA: Diagnosis not present

## 2018-06-07 DIAGNOSIS — E440 Moderate protein-calorie malnutrition: Secondary | ICD-10-CM | POA: Diagnosis not present

## 2018-06-07 DIAGNOSIS — N186 End stage renal disease: Secondary | ICD-10-CM | POA: Diagnosis not present

## 2018-06-07 DIAGNOSIS — D631 Anemia in chronic kidney disease: Secondary | ICD-10-CM | POA: Diagnosis not present

## 2018-06-08 DIAGNOSIS — D631 Anemia in chronic kidney disease: Secondary | ICD-10-CM | POA: Diagnosis not present

## 2018-06-08 DIAGNOSIS — Z4932 Encounter for adequacy testing for peritoneal dialysis: Secondary | ICD-10-CM | POA: Diagnosis not present

## 2018-06-08 DIAGNOSIS — D509 Iron deficiency anemia, unspecified: Secondary | ICD-10-CM | POA: Diagnosis not present

## 2018-06-08 DIAGNOSIS — E440 Moderate protein-calorie malnutrition: Secondary | ICD-10-CM | POA: Diagnosis not present

## 2018-06-08 DIAGNOSIS — N2581 Secondary hyperparathyroidism of renal origin: Secondary | ICD-10-CM | POA: Diagnosis not present

## 2018-06-08 DIAGNOSIS — N186 End stage renal disease: Secondary | ICD-10-CM | POA: Diagnosis not present

## 2018-06-09 DIAGNOSIS — N2581 Secondary hyperparathyroidism of renal origin: Secondary | ICD-10-CM | POA: Diagnosis not present

## 2018-06-09 DIAGNOSIS — D631 Anemia in chronic kidney disease: Secondary | ICD-10-CM | POA: Diagnosis not present

## 2018-06-09 DIAGNOSIS — Z4932 Encounter for adequacy testing for peritoneal dialysis: Secondary | ICD-10-CM | POA: Diagnosis not present

## 2018-06-09 DIAGNOSIS — E440 Moderate protein-calorie malnutrition: Secondary | ICD-10-CM | POA: Diagnosis not present

## 2018-06-09 DIAGNOSIS — D509 Iron deficiency anemia, unspecified: Secondary | ICD-10-CM | POA: Diagnosis not present

## 2018-06-09 DIAGNOSIS — N186 End stage renal disease: Secondary | ICD-10-CM | POA: Diagnosis not present

## 2018-06-10 DIAGNOSIS — N2581 Secondary hyperparathyroidism of renal origin: Secondary | ICD-10-CM | POA: Diagnosis not present

## 2018-06-10 DIAGNOSIS — D631 Anemia in chronic kidney disease: Secondary | ICD-10-CM | POA: Diagnosis not present

## 2018-06-10 DIAGNOSIS — E440 Moderate protein-calorie malnutrition: Secondary | ICD-10-CM | POA: Diagnosis not present

## 2018-06-10 DIAGNOSIS — R82998 Other abnormal findings in urine: Secondary | ICD-10-CM | POA: Diagnosis not present

## 2018-06-10 DIAGNOSIS — D509 Iron deficiency anemia, unspecified: Secondary | ICD-10-CM | POA: Diagnosis not present

## 2018-06-10 DIAGNOSIS — Z4932 Encounter for adequacy testing for peritoneal dialysis: Secondary | ICD-10-CM | POA: Diagnosis not present

## 2018-06-10 DIAGNOSIS — N186 End stage renal disease: Secondary | ICD-10-CM | POA: Diagnosis not present

## 2018-06-11 DIAGNOSIS — D509 Iron deficiency anemia, unspecified: Secondary | ICD-10-CM | POA: Diagnosis not present

## 2018-06-11 DIAGNOSIS — N2581 Secondary hyperparathyroidism of renal origin: Secondary | ICD-10-CM | POA: Diagnosis not present

## 2018-06-11 DIAGNOSIS — N186 End stage renal disease: Secondary | ICD-10-CM | POA: Diagnosis not present

## 2018-06-11 DIAGNOSIS — D631 Anemia in chronic kidney disease: Secondary | ICD-10-CM | POA: Diagnosis not present

## 2018-06-11 DIAGNOSIS — Z4932 Encounter for adequacy testing for peritoneal dialysis: Secondary | ICD-10-CM | POA: Diagnosis not present

## 2018-06-11 DIAGNOSIS — E440 Moderate protein-calorie malnutrition: Secondary | ICD-10-CM | POA: Diagnosis not present

## 2018-06-12 DIAGNOSIS — Z4932 Encounter for adequacy testing for peritoneal dialysis: Secondary | ICD-10-CM | POA: Diagnosis not present

## 2018-06-12 DIAGNOSIS — N2581 Secondary hyperparathyroidism of renal origin: Secondary | ICD-10-CM | POA: Diagnosis not present

## 2018-06-12 DIAGNOSIS — E440 Moderate protein-calorie malnutrition: Secondary | ICD-10-CM | POA: Diagnosis not present

## 2018-06-12 DIAGNOSIS — D509 Iron deficiency anemia, unspecified: Secondary | ICD-10-CM | POA: Diagnosis not present

## 2018-06-12 DIAGNOSIS — N186 End stage renal disease: Secondary | ICD-10-CM | POA: Diagnosis not present

## 2018-06-12 DIAGNOSIS — D631 Anemia in chronic kidney disease: Secondary | ICD-10-CM | POA: Diagnosis not present

## 2018-06-13 DIAGNOSIS — D509 Iron deficiency anemia, unspecified: Secondary | ICD-10-CM | POA: Diagnosis not present

## 2018-06-13 DIAGNOSIS — N186 End stage renal disease: Secondary | ICD-10-CM | POA: Diagnosis not present

## 2018-06-13 DIAGNOSIS — Z4932 Encounter for adequacy testing for peritoneal dialysis: Secondary | ICD-10-CM | POA: Diagnosis not present

## 2018-06-13 DIAGNOSIS — E440 Moderate protein-calorie malnutrition: Secondary | ICD-10-CM | POA: Diagnosis not present

## 2018-06-13 DIAGNOSIS — D631 Anemia in chronic kidney disease: Secondary | ICD-10-CM | POA: Diagnosis not present

## 2018-06-13 DIAGNOSIS — N2581 Secondary hyperparathyroidism of renal origin: Secondary | ICD-10-CM | POA: Diagnosis not present

## 2018-06-14 DIAGNOSIS — N2581 Secondary hyperparathyroidism of renal origin: Secondary | ICD-10-CM | POA: Diagnosis not present

## 2018-06-14 DIAGNOSIS — D509 Iron deficiency anemia, unspecified: Secondary | ICD-10-CM | POA: Diagnosis not present

## 2018-06-14 DIAGNOSIS — N186 End stage renal disease: Secondary | ICD-10-CM | POA: Diagnosis not present

## 2018-06-14 DIAGNOSIS — Z4932 Encounter for adequacy testing for peritoneal dialysis: Secondary | ICD-10-CM | POA: Diagnosis not present

## 2018-06-14 DIAGNOSIS — D631 Anemia in chronic kidney disease: Secondary | ICD-10-CM | POA: Diagnosis not present

## 2018-06-14 DIAGNOSIS — E440 Moderate protein-calorie malnutrition: Secondary | ICD-10-CM | POA: Diagnosis not present

## 2018-06-15 DIAGNOSIS — N186 End stage renal disease: Secondary | ICD-10-CM | POA: Diagnosis not present

## 2018-06-15 DIAGNOSIS — D631 Anemia in chronic kidney disease: Secondary | ICD-10-CM | POA: Diagnosis not present

## 2018-06-15 DIAGNOSIS — N2581 Secondary hyperparathyroidism of renal origin: Secondary | ICD-10-CM | POA: Diagnosis not present

## 2018-06-15 DIAGNOSIS — D509 Iron deficiency anemia, unspecified: Secondary | ICD-10-CM | POA: Diagnosis not present

## 2018-06-15 DIAGNOSIS — E440 Moderate protein-calorie malnutrition: Secondary | ICD-10-CM | POA: Diagnosis not present

## 2018-06-15 DIAGNOSIS — Z4932 Encounter for adequacy testing for peritoneal dialysis: Secondary | ICD-10-CM | POA: Diagnosis not present

## 2018-06-16 DIAGNOSIS — D631 Anemia in chronic kidney disease: Secondary | ICD-10-CM | POA: Diagnosis not present

## 2018-06-16 DIAGNOSIS — N186 End stage renal disease: Secondary | ICD-10-CM | POA: Diagnosis not present

## 2018-06-16 DIAGNOSIS — Z4932 Encounter for adequacy testing for peritoneal dialysis: Secondary | ICD-10-CM | POA: Diagnosis not present

## 2018-06-16 DIAGNOSIS — E440 Moderate protein-calorie malnutrition: Secondary | ICD-10-CM | POA: Diagnosis not present

## 2018-06-16 DIAGNOSIS — N2581 Secondary hyperparathyroidism of renal origin: Secondary | ICD-10-CM | POA: Diagnosis not present

## 2018-06-16 DIAGNOSIS — D509 Iron deficiency anemia, unspecified: Secondary | ICD-10-CM | POA: Diagnosis not present

## 2018-06-17 ENCOUNTER — Encounter (INDEPENDENT_AMBULATORY_CARE_PROVIDER_SITE_OTHER): Payer: Self-pay | Admitting: Nurse Practitioner

## 2018-06-17 ENCOUNTER — Ambulatory Visit (INDEPENDENT_AMBULATORY_CARE_PROVIDER_SITE_OTHER): Payer: Medicare Other | Admitting: Nurse Practitioner

## 2018-06-17 ENCOUNTER — Ambulatory Visit (INDEPENDENT_AMBULATORY_CARE_PROVIDER_SITE_OTHER): Payer: Medicare Other

## 2018-06-17 VITALS — BP 124/81 | HR 69 | Resp 17 | Ht 67.0 in | Wt 184.0 lb

## 2018-06-17 DIAGNOSIS — T829XXD Unspecified complication of cardiac and vascular prosthetic device, implant and graft, subsequent encounter: Secondary | ICD-10-CM

## 2018-06-17 DIAGNOSIS — N186 End stage renal disease: Secondary | ICD-10-CM | POA: Diagnosis not present

## 2018-06-17 DIAGNOSIS — I10 Essential (primary) hypertension: Secondary | ICD-10-CM

## 2018-06-17 DIAGNOSIS — I120 Hypertensive chronic kidney disease with stage 5 chronic kidney disease or end stage renal disease: Secondary | ICD-10-CM | POA: Diagnosis not present

## 2018-06-17 DIAGNOSIS — E440 Moderate protein-calorie malnutrition: Secondary | ICD-10-CM | POA: Diagnosis not present

## 2018-06-17 DIAGNOSIS — Z720 Tobacco use: Secondary | ICD-10-CM

## 2018-06-17 DIAGNOSIS — F17210 Nicotine dependence, cigarettes, uncomplicated: Secondary | ICD-10-CM | POA: Diagnosis not present

## 2018-06-17 DIAGNOSIS — Z992 Dependence on renal dialysis: Secondary | ICD-10-CM

## 2018-06-17 DIAGNOSIS — D509 Iron deficiency anemia, unspecified: Secondary | ICD-10-CM | POA: Diagnosis not present

## 2018-06-17 DIAGNOSIS — D631 Anemia in chronic kidney disease: Secondary | ICD-10-CM | POA: Diagnosis not present

## 2018-06-17 DIAGNOSIS — N2581 Secondary hyperparathyroidism of renal origin: Secondary | ICD-10-CM | POA: Diagnosis not present

## 2018-06-17 DIAGNOSIS — Z4932 Encounter for adequacy testing for peritoneal dialysis: Secondary | ICD-10-CM | POA: Diagnosis not present

## 2018-06-18 DIAGNOSIS — Z4932 Encounter for adequacy testing for peritoneal dialysis: Secondary | ICD-10-CM | POA: Diagnosis not present

## 2018-06-18 DIAGNOSIS — E440 Moderate protein-calorie malnutrition: Secondary | ICD-10-CM | POA: Diagnosis not present

## 2018-06-18 DIAGNOSIS — N186 End stage renal disease: Secondary | ICD-10-CM | POA: Diagnosis not present

## 2018-06-18 DIAGNOSIS — D509 Iron deficiency anemia, unspecified: Secondary | ICD-10-CM | POA: Diagnosis not present

## 2018-06-18 DIAGNOSIS — D631 Anemia in chronic kidney disease: Secondary | ICD-10-CM | POA: Diagnosis not present

## 2018-06-18 DIAGNOSIS — N2581 Secondary hyperparathyroidism of renal origin: Secondary | ICD-10-CM | POA: Diagnosis not present

## 2018-06-19 DIAGNOSIS — D631 Anemia in chronic kidney disease: Secondary | ICD-10-CM | POA: Diagnosis not present

## 2018-06-19 DIAGNOSIS — D509 Iron deficiency anemia, unspecified: Secondary | ICD-10-CM | POA: Diagnosis not present

## 2018-06-19 DIAGNOSIS — N2581 Secondary hyperparathyroidism of renal origin: Secondary | ICD-10-CM | POA: Diagnosis not present

## 2018-06-19 DIAGNOSIS — E440 Moderate protein-calorie malnutrition: Secondary | ICD-10-CM | POA: Diagnosis not present

## 2018-06-19 DIAGNOSIS — N186 End stage renal disease: Secondary | ICD-10-CM | POA: Diagnosis not present

## 2018-06-19 DIAGNOSIS — Z4932 Encounter for adequacy testing for peritoneal dialysis: Secondary | ICD-10-CM | POA: Diagnosis not present

## 2018-06-20 DIAGNOSIS — N186 End stage renal disease: Secondary | ICD-10-CM | POA: Diagnosis not present

## 2018-06-20 DIAGNOSIS — N2581 Secondary hyperparathyroidism of renal origin: Secondary | ICD-10-CM | POA: Diagnosis not present

## 2018-06-20 DIAGNOSIS — D509 Iron deficiency anemia, unspecified: Secondary | ICD-10-CM | POA: Diagnosis not present

## 2018-06-20 DIAGNOSIS — D631 Anemia in chronic kidney disease: Secondary | ICD-10-CM | POA: Diagnosis not present

## 2018-06-20 DIAGNOSIS — E440 Moderate protein-calorie malnutrition: Secondary | ICD-10-CM | POA: Diagnosis not present

## 2018-06-20 DIAGNOSIS — Z4932 Encounter for adequacy testing for peritoneal dialysis: Secondary | ICD-10-CM | POA: Diagnosis not present

## 2018-06-21 DIAGNOSIS — Z4932 Encounter for adequacy testing for peritoneal dialysis: Secondary | ICD-10-CM | POA: Diagnosis not present

## 2018-06-21 DIAGNOSIS — D509 Iron deficiency anemia, unspecified: Secondary | ICD-10-CM | POA: Diagnosis not present

## 2018-06-21 DIAGNOSIS — N2581 Secondary hyperparathyroidism of renal origin: Secondary | ICD-10-CM | POA: Diagnosis not present

## 2018-06-21 DIAGNOSIS — D631 Anemia in chronic kidney disease: Secondary | ICD-10-CM | POA: Diagnosis not present

## 2018-06-21 DIAGNOSIS — E440 Moderate protein-calorie malnutrition: Secondary | ICD-10-CM | POA: Diagnosis not present

## 2018-06-21 DIAGNOSIS — N186 End stage renal disease: Secondary | ICD-10-CM | POA: Diagnosis not present

## 2018-06-22 ENCOUNTER — Encounter (INDEPENDENT_AMBULATORY_CARE_PROVIDER_SITE_OTHER): Payer: Self-pay | Admitting: Nurse Practitioner

## 2018-06-22 DIAGNOSIS — D631 Anemia in chronic kidney disease: Secondary | ICD-10-CM | POA: Diagnosis not present

## 2018-06-22 DIAGNOSIS — N2581 Secondary hyperparathyroidism of renal origin: Secondary | ICD-10-CM | POA: Diagnosis not present

## 2018-06-22 DIAGNOSIS — Z4932 Encounter for adequacy testing for peritoneal dialysis: Secondary | ICD-10-CM | POA: Diagnosis not present

## 2018-06-22 DIAGNOSIS — E440 Moderate protein-calorie malnutrition: Secondary | ICD-10-CM | POA: Diagnosis not present

## 2018-06-22 DIAGNOSIS — D509 Iron deficiency anemia, unspecified: Secondary | ICD-10-CM | POA: Diagnosis not present

## 2018-06-22 DIAGNOSIS — N186 End stage renal disease: Secondary | ICD-10-CM | POA: Diagnosis not present

## 2018-06-22 NOTE — Progress Notes (Signed)
Subjective:    Patient ID: Ruth Gutierrez, female    DOB: 1966/05/19, 52 y.o.   MRN: [id number] Chief Complaint  Patient presents with  . Follow-up    27month HDA    HPI  Ruth Gutierrez [id number] is a 52 y.o. female The patient returns to the office for followup of their dialysis access. The function of the access has been stable. The patient denies increased bleeding time or increased recirculation. Patient denies difficulty with cannulation. The patient denies hand pain or other symptoms consistent with steal phenomena.  No significant arm swelling.  Patient recently began peritoneal dialysis about 3 months ago.  She denies any issues with it currently.  The patient denies redness or swelling at the access site. The patient denies fever or chills at home or while on dialysis.  The patient denies amaurosis fugax or recent TIA symptoms. There are no recent neurological changes noted. The patient denies claudication symptoms or rest pain symptoms. The patient denies history of DVT, PE or superficial thrombophlebitis. The patient denies recent episodes of angina or shortness of breath.    Patient underwent a hemodialysis access duplex today which revealed a flow volume of 1754 and her left brachial basilic AV fistula.  There are increased velocities at the level of the confluence, however there is no significant change in the confluence velocities when compared to the previous exam on 10/13/2017.    Past Medical History:  Diagnosis Date  . Anemia of chronic disease   . Arthritis   . Deceased-donor kidney transplant    Performed at [facility], April 2010.  Initial ESRD due to HTN nephropathy  . Eczema   . ESRD (end stage renal disease) ([place])    s/p transplant creatinine baseline 1.1  M/W/F dialysis  . FUO (fever of unknown origin) 05/17/2015  . GERD (gastroesophageal reflux disease)   . Headache(784.0)   . History of hyperparathyroidism   . Hypertension   . Shortness of breath    . Wears glasses     Past Surgical History:  Procedure Laterality Date  . A/V FISTULAGRAM Left 02/12/2017   Procedure: A/V Fistulagram;  Surgeon: Algernon Huxley, MD;  Location: [place] [facility];  Service: Cardiovascular;  Laterality: Left;  . A/V FISTULAGRAM Left 12/30/2017   Procedure: A/V FISTULAGRAM;  Surgeon: Algernon Huxley, MD;  Location: [place] [facility];  Service: Cardiovascular;  Laterality: Left;  . A/V SHUNT INTERVENTION N/A 02/12/2017   Procedure: A/V Shunt Intervention;  Surgeon: Algernon Huxley, MD;  Location: [place] [facility];  Service: Cardiovascular;  Laterality: N/A;  . AV FISTULA PLACEMENT    . BASCILIC VEIN TRANSPOSITION Left 10/30/2014   Procedure: LEFT BASCILIC VEIN TRANSPOSITION;  Surgeon: Rosetta Posner, MD;  Location: [place];  Service: Vascular;  Laterality: Left;  . BASCILIC VEIN TRANSPOSITION Left 01/03/2015   Procedure: LEFT ARM 2ND STAGE BASCILIC VEIN TRANSPOSITION;  Surgeon: Rosetta Posner, MD;  Location: [place];  Service: Vascular;  Laterality: Left;  . FRACTURE SURGERY     left foot,baby toe nad next toe missing  . INSERTION OF DIALYSIS CATHETER Right 10/30/2014   Procedure: INSERTION OF DIALYSIS CATHETER;  Surgeon: Rosetta Posner, MD;  Location: [place];  Service: Vascular;  Laterality: Right;  . KIDNEY TRANSPLANT  11/2008   [facility])  . WISDOM TOOTH EXTRACTION      Social History   Socioeconomic History  . Marital status: Single    Spouse name: Not on file  .  Number of children: Not on file  . Years of education: Not on file  . Highest education level: Not on file  Occupational History  . Not on file  Social Needs  . Financial resource strain: Not on file  . Food insecurity:    Worry: Not on file    Inability: Not on file  . Transportation needs:    Medical: Not on file    Non-medical: Not on file  Tobacco Use  . Smoking status: Current Every Day Smoker    Packs/day: 0.50    Years: 27.00    Pack years: 13.50    Types: Cigarettes  .  Smokeless tobacco: Never Used  . Tobacco comment: 10 cigarettes a day  Substance and Sexual Activity  . Alcohol use: No    Alcohol/week: 0.0 standard drinks    Comment: occasional drinker noted in the past  . Drug use: No  . Sexual activity: Yes    Partners: Male    Birth control/protection: None  Lifestyle  . Physical activity:    Days per week: Not on file    Minutes per session: Not on file  . Stress: Not on file  Relationships  . Social connections:    Talks on phone: Not on file    Gets together: Not on file    Attends religious service: Not on file    Active member of club or organization: Not on file    Attends meetings of clubs or organizations: Not on file    Relationship status: Not on file  . Intimate partner violence:    Fear of current or ex partner: Not on file    Emotionally abused: Not on file    Physically abused: Not on file    Forced sexual activity: Not on file  Other Topics Concern  . Not on file  Social History Narrative   Lives with daughter (born 24), sister helps her with medications etc.    Family History  Problem Relation Age of Onset  . Hypertension Mother   . Hypertension Father   . Diabetes Brother   . Deep vein thrombosis Brother   . Hypertension Sister   . Hyperlipidemia Sister   . Kidney disease Brother        on HD    Allergies  Allergen Reactions  . Penicillin G Itching and Rash  . Penicillins Itching and Rash     Review of Systems   Review of Systems: Negative Unless Checked Constitutional: [] Weight loss  [] Fever  [] Chills Cardiac: [] Chest pain   []  Atrial Fibrillation  [] Palpitations   [] Shortness of breath when laying flat   [] Shortness of breath with exertion. Vascular:  [] Pain in legs with walking   [] Pain in legs with standing  [] History of DVT   [] Phlebitis   [] Swelling in legs   [] Varicose veins   [] Non-healing ulcers Pulmonary:   [] Uses home oxygen   [] Productive cough   [] Hemoptysis   [] Wheeze  [] COPD    [] Asthma Neurologic:  [] Dizziness   [] Seizures   [] History of stroke   [] History of TIA  [] Aphasia   [] Vissual changes   [] Weakness or numbness in arm   [] Weakness or numbness in leg Musculoskeletal:   [] Joint swelling   [] Joint pain   [] Low back pain  []  History of Knee Replacement Hematologic:  [] Easy bruising  [] Easy bleeding   [] Hypercoagulable state   [] Anemic Gastrointestinal:  [x] Diarrhea   [] Vomiting  [] Gastroesophageal reflux/heartburn   [] Difficulty swallowing. Genitourinary:  [x] Chronic  kidney disease   [] Difficult urination  [] Anuric   [] Blood in urine Skin:  [] Rashes   [] Ulcers  Psychological:  [] History of anxiety   []  History of major depression  []  Memory Difficulties     Objective:   Physical Exam  BP 124/81 (BP Location: Right Arm)   Pulse 69   Resp 17   Ht 5\' 7"  (1.702 m)   Wt 184 lb (83.5 kg)   LMP 06/02/2015   BMI 28.82 kg/m   Gen: WD/WN, NAD Head: [place]/AT, No temporalis wasting.  Ear/Nose/Throat: Hearing grossly intact, nares w/o erythema or drainage Eyes: PER, EOMI, sclera nonicteric.  Neck: Supple, no masses.  No JVD.  Pulmonary:  Good air movement, no use of accessory muscles.  Cardiac: RRR Vascular:  Good thrill and bruit Vessel Right Left  Radial Palpable Palpable   Gastrointestinal: soft, non-distended. No guarding/no peritoneal signs.  PD catheter present Musculoskeletal: M/S 5/5 throughout.  No deformity or atrophy.  Neurologic: Pain and light touch intact in extremities.  Symmetrical.  Speech is fluent. Motor exam as listed above. Psychiatric: Judgment intact, Mood & affect appropriate for pt's clinical situation. Dermatologic: No Venous rashes. No Ulcers Noted.  No changes consistent with cellulitis. Lymph : No Cervical lymphadenopathy, no lichenification or skin changes of chronic lymphedema.      Assessment & Plan:   1. ESRD on dialysis [facility])  Patient underwent a hemodialysis access duplex today which revealed a flow volume of 1754 and her  left brachial basilic AV fistula.  There are increased velocities at the level of the confluence, however there is no significant change in the confluence velocities when compared to the previous exam on 10/13/2017.      Recommend:  The patient is doing well and currently has adequate dialysis access. The patient's dialysis center is not reporting any access issues. Flow pattern is stable when compared to the prior ultrasound.  The patient should have a duplex ultrasound of the dialysis access in 6 months. The patient will follow-up with me in the office after each ultrasound     2. Essential hypertension Continue antihypertensive medications as already ordered, these medications have been reviewed and there are no changes at this time.   3. Tobacco abuse Smoking cessation was discussed, 3-10 minutes spent on this topic specifically    Current Outpatient Medications on File Prior to Visit  Medication Sig Dispense Refill  . amLODipine (NORVASC) 10 MG tablet Take 10 mg by mouth daily.     Marland Kitchen aspirin 81 MG tablet Take 81 mg by mouth daily.     Marland Kitchen atenolol (TENORMIN) 50 MG tablet Take 50 mg by mouth 2 (two) times daily.     . calcitRIOL (ROCALTROL) 0.5 MCG capsule Take 1 capsule (0.5 mcg total) by mouth every Monday, Wednesday, and Friday with hemodialysis.    Marland Kitchen calcium acetate (PHOSLO) 667 MG capsule Take 1,334-3,335 mg by mouth 3 (three) times daily with meals. Tales 5 caps with each meal, 2 caps with snacks    . diphenhydrAMINE (BENADRYL) 25 MG tablet Take 25 mg by mouth every 8 (eight) hours as needed (for cough).    . ferric citrate (AURYXIA) 1 GM 210 MG(Fe) tablet Take 420 mg by mouth 3 (three) times daily with meals.    Marland Kitchen lisinopril (PRINIVIL,ZESTRIL) 40 MG tablet Take 40 mg by mouth daily.    . norethindrone (AYGESTIN) 5 MG tablet Take 5 mg by mouth 2 (two) times daily.   11  . sevelamer carbonate (  RENVELA) 800 MG tablet Take 800 mg by mouth 3 (three) times daily with meals.      No current facility-administered medications on file prior to visit.     There are no Patient Instructions on file for this visit. Return in about 6 months (around 12/16/2018).   Kris Hartmann, NP  This note was completed with Sales executive.  Any errors are purely unintentional.

## 2018-06-23 DIAGNOSIS — N186 End stage renal disease: Secondary | ICD-10-CM | POA: Diagnosis not present

## 2018-06-23 DIAGNOSIS — D631 Anemia in chronic kidney disease: Secondary | ICD-10-CM | POA: Diagnosis not present

## 2018-06-23 DIAGNOSIS — D509 Iron deficiency anemia, unspecified: Secondary | ICD-10-CM | POA: Diagnosis not present

## 2018-06-23 DIAGNOSIS — N2581 Secondary hyperparathyroidism of renal origin: Secondary | ICD-10-CM | POA: Diagnosis not present

## 2018-06-23 DIAGNOSIS — Z4932 Encounter for adequacy testing for peritoneal dialysis: Secondary | ICD-10-CM | POA: Diagnosis not present

## 2018-06-23 DIAGNOSIS — E440 Moderate protein-calorie malnutrition: Secondary | ICD-10-CM | POA: Diagnosis not present

## 2018-06-24 DIAGNOSIS — N186 End stage renal disease: Secondary | ICD-10-CM | POA: Diagnosis not present

## 2018-06-24 DIAGNOSIS — N2581 Secondary hyperparathyroidism of renal origin: Secondary | ICD-10-CM | POA: Diagnosis not present

## 2018-06-24 DIAGNOSIS — E440 Moderate protein-calorie malnutrition: Secondary | ICD-10-CM | POA: Diagnosis not present

## 2018-06-24 DIAGNOSIS — Z4932 Encounter for adequacy testing for peritoneal dialysis: Secondary | ICD-10-CM | POA: Diagnosis not present

## 2018-06-24 DIAGNOSIS — D631 Anemia in chronic kidney disease: Secondary | ICD-10-CM | POA: Diagnosis not present

## 2018-06-24 DIAGNOSIS — D509 Iron deficiency anemia, unspecified: Secondary | ICD-10-CM | POA: Diagnosis not present

## 2018-06-25 DIAGNOSIS — N2581 Secondary hyperparathyroidism of renal origin: Secondary | ICD-10-CM | POA: Diagnosis not present

## 2018-06-25 DIAGNOSIS — D509 Iron deficiency anemia, unspecified: Secondary | ICD-10-CM | POA: Diagnosis not present

## 2018-06-25 DIAGNOSIS — N186 End stage renal disease: Secondary | ICD-10-CM | POA: Diagnosis not present

## 2018-06-25 DIAGNOSIS — D631 Anemia in chronic kidney disease: Secondary | ICD-10-CM | POA: Diagnosis not present

## 2018-06-25 DIAGNOSIS — E440 Moderate protein-calorie malnutrition: Secondary | ICD-10-CM | POA: Diagnosis not present

## 2018-06-25 DIAGNOSIS — Z4932 Encounter for adequacy testing for peritoneal dialysis: Secondary | ICD-10-CM | POA: Diagnosis not present

## 2018-06-26 DIAGNOSIS — D631 Anemia in chronic kidney disease: Secondary | ICD-10-CM | POA: Diagnosis not present

## 2018-06-26 DIAGNOSIS — D509 Iron deficiency anemia, unspecified: Secondary | ICD-10-CM | POA: Diagnosis not present

## 2018-06-26 DIAGNOSIS — Z4932 Encounter for adequacy testing for peritoneal dialysis: Secondary | ICD-10-CM | POA: Diagnosis not present

## 2018-06-26 DIAGNOSIS — N2581 Secondary hyperparathyroidism of renal origin: Secondary | ICD-10-CM | POA: Diagnosis not present

## 2018-06-26 DIAGNOSIS — E440 Moderate protein-calorie malnutrition: Secondary | ICD-10-CM | POA: Diagnosis not present

## 2018-06-26 DIAGNOSIS — N186 End stage renal disease: Secondary | ICD-10-CM | POA: Diagnosis not present

## 2018-06-27 DIAGNOSIS — D631 Anemia in chronic kidney disease: Secondary | ICD-10-CM | POA: Diagnosis not present

## 2018-06-27 DIAGNOSIS — N2581 Secondary hyperparathyroidism of renal origin: Secondary | ICD-10-CM | POA: Diagnosis not present

## 2018-06-27 DIAGNOSIS — E440 Moderate protein-calorie malnutrition: Secondary | ICD-10-CM | POA: Diagnosis not present

## 2018-06-27 DIAGNOSIS — N186 End stage renal disease: Secondary | ICD-10-CM | POA: Diagnosis not present

## 2018-06-27 DIAGNOSIS — Z4932 Encounter for adequacy testing for peritoneal dialysis: Secondary | ICD-10-CM | POA: Diagnosis not present

## 2018-06-27 DIAGNOSIS — D509 Iron deficiency anemia, unspecified: Secondary | ICD-10-CM | POA: Diagnosis not present

## 2018-06-28 DIAGNOSIS — D509 Iron deficiency anemia, unspecified: Secondary | ICD-10-CM | POA: Diagnosis not present

## 2018-06-28 DIAGNOSIS — D631 Anemia in chronic kidney disease: Secondary | ICD-10-CM | POA: Diagnosis not present

## 2018-06-28 DIAGNOSIS — N2581 Secondary hyperparathyroidism of renal origin: Secondary | ICD-10-CM | POA: Diagnosis not present

## 2018-06-28 DIAGNOSIS — E440 Moderate protein-calorie malnutrition: Secondary | ICD-10-CM | POA: Diagnosis not present

## 2018-06-28 DIAGNOSIS — N186 End stage renal disease: Secondary | ICD-10-CM | POA: Diagnosis not present

## 2018-06-28 DIAGNOSIS — Z4932 Encounter for adequacy testing for peritoneal dialysis: Secondary | ICD-10-CM | POA: Diagnosis not present

## 2018-06-29 DIAGNOSIS — N2581 Secondary hyperparathyroidism of renal origin: Secondary | ICD-10-CM | POA: Diagnosis not present

## 2018-06-29 DIAGNOSIS — D509 Iron deficiency anemia, unspecified: Secondary | ICD-10-CM | POA: Diagnosis not present

## 2018-06-29 DIAGNOSIS — E440 Moderate protein-calorie malnutrition: Secondary | ICD-10-CM | POA: Diagnosis not present

## 2018-06-29 DIAGNOSIS — N186 End stage renal disease: Secondary | ICD-10-CM | POA: Diagnosis not present

## 2018-06-29 DIAGNOSIS — Z4932 Encounter for adequacy testing for peritoneal dialysis: Secondary | ICD-10-CM | POA: Diagnosis not present

## 2018-06-29 DIAGNOSIS — D631 Anemia in chronic kidney disease: Secondary | ICD-10-CM | POA: Diagnosis not present

## 2018-06-30 DIAGNOSIS — N2581 Secondary hyperparathyroidism of renal origin: Secondary | ICD-10-CM | POA: Diagnosis not present

## 2018-06-30 DIAGNOSIS — D509 Iron deficiency anemia, unspecified: Secondary | ICD-10-CM | POA: Diagnosis not present

## 2018-06-30 DIAGNOSIS — N186 End stage renal disease: Secondary | ICD-10-CM | POA: Diagnosis not present

## 2018-06-30 DIAGNOSIS — E440 Moderate protein-calorie malnutrition: Secondary | ICD-10-CM | POA: Diagnosis not present

## 2018-06-30 DIAGNOSIS — D631 Anemia in chronic kidney disease: Secondary | ICD-10-CM | POA: Diagnosis not present

## 2018-06-30 DIAGNOSIS — Z4932 Encounter for adequacy testing for peritoneal dialysis: Secondary | ICD-10-CM | POA: Diagnosis not present

## 2018-07-01 DIAGNOSIS — N186 End stage renal disease: Secondary | ICD-10-CM | POA: Diagnosis not present

## 2018-07-01 DIAGNOSIS — E440 Moderate protein-calorie malnutrition: Secondary | ICD-10-CM | POA: Diagnosis not present

## 2018-07-01 DIAGNOSIS — D631 Anemia in chronic kidney disease: Secondary | ICD-10-CM | POA: Diagnosis not present

## 2018-07-01 DIAGNOSIS — Z4932 Encounter for adequacy testing for peritoneal dialysis: Secondary | ICD-10-CM | POA: Diagnosis not present

## 2018-07-01 DIAGNOSIS — D509 Iron deficiency anemia, unspecified: Secondary | ICD-10-CM | POA: Diagnosis not present

## 2018-07-01 DIAGNOSIS — N2581 Secondary hyperparathyroidism of renal origin: Secondary | ICD-10-CM | POA: Diagnosis not present

## 2018-07-02 DIAGNOSIS — D509 Iron deficiency anemia, unspecified: Secondary | ICD-10-CM | POA: Diagnosis not present

## 2018-07-02 DIAGNOSIS — Z4932 Encounter for adequacy testing for peritoneal dialysis: Secondary | ICD-10-CM | POA: Diagnosis not present

## 2018-07-02 DIAGNOSIS — E440 Moderate protein-calorie malnutrition: Secondary | ICD-10-CM | POA: Diagnosis not present

## 2018-07-02 DIAGNOSIS — N186 End stage renal disease: Secondary | ICD-10-CM | POA: Diagnosis not present

## 2018-07-02 DIAGNOSIS — D631 Anemia in chronic kidney disease: Secondary | ICD-10-CM | POA: Diagnosis not present

## 2018-07-02 DIAGNOSIS — N2581 Secondary hyperparathyroidism of renal origin: Secondary | ICD-10-CM | POA: Diagnosis not present

## 2018-07-03 DIAGNOSIS — D509 Iron deficiency anemia, unspecified: Secondary | ICD-10-CM | POA: Diagnosis not present

## 2018-07-03 DIAGNOSIS — D631 Anemia in chronic kidney disease: Secondary | ICD-10-CM | POA: Diagnosis not present

## 2018-07-03 DIAGNOSIS — N2581 Secondary hyperparathyroidism of renal origin: Secondary | ICD-10-CM | POA: Diagnosis not present

## 2018-07-03 DIAGNOSIS — N186 End stage renal disease: Secondary | ICD-10-CM | POA: Diagnosis not present

## 2018-07-03 DIAGNOSIS — E440 Moderate protein-calorie malnutrition: Secondary | ICD-10-CM | POA: Diagnosis not present

## 2018-07-03 DIAGNOSIS — Z4932 Encounter for adequacy testing for peritoneal dialysis: Secondary | ICD-10-CM | POA: Diagnosis not present

## 2018-07-04 DIAGNOSIS — Z992 Dependence on renal dialysis: Secondary | ICD-10-CM | POA: Diagnosis not present

## 2018-07-04 DIAGNOSIS — D509 Iron deficiency anemia, unspecified: Secondary | ICD-10-CM | POA: Diagnosis not present

## 2018-07-04 DIAGNOSIS — Z79899 Other long term (current) drug therapy: Secondary | ICD-10-CM | POA: Diagnosis not present

## 2018-07-04 DIAGNOSIS — K769 Liver disease, unspecified: Secondary | ICD-10-CM | POA: Diagnosis not present

## 2018-07-04 DIAGNOSIS — R17 Unspecified jaundice: Secondary | ICD-10-CM | POA: Diagnosis not present

## 2018-07-04 DIAGNOSIS — D631 Anemia in chronic kidney disease: Secondary | ICD-10-CM | POA: Diagnosis not present

## 2018-07-04 DIAGNOSIS — E440 Moderate protein-calorie malnutrition: Secondary | ICD-10-CM | POA: Diagnosis not present

## 2018-07-04 DIAGNOSIS — I129 Hypertensive chronic kidney disease with stage 1 through stage 4 chronic kidney disease, or unspecified chronic kidney disease: Secondary | ICD-10-CM | POA: Diagnosis not present

## 2018-07-04 DIAGNOSIS — Z4932 Encounter for adequacy testing for peritoneal dialysis: Secondary | ICD-10-CM | POA: Diagnosis not present

## 2018-07-04 DIAGNOSIS — N2581 Secondary hyperparathyroidism of renal origin: Secondary | ICD-10-CM | POA: Diagnosis not present

## 2018-07-04 DIAGNOSIS — N186 End stage renal disease: Secondary | ICD-10-CM | POA: Diagnosis not present

## 2018-07-05 DIAGNOSIS — K769 Liver disease, unspecified: Secondary | ICD-10-CM | POA: Diagnosis not present

## 2018-07-05 DIAGNOSIS — N186 End stage renal disease: Secondary | ICD-10-CM | POA: Diagnosis not present

## 2018-07-05 DIAGNOSIS — D509 Iron deficiency anemia, unspecified: Secondary | ICD-10-CM | POA: Diagnosis not present

## 2018-07-05 DIAGNOSIS — N2581 Secondary hyperparathyroidism of renal origin: Secondary | ICD-10-CM | POA: Diagnosis not present

## 2018-07-05 DIAGNOSIS — E440 Moderate protein-calorie malnutrition: Secondary | ICD-10-CM | POA: Diagnosis not present

## 2018-07-05 DIAGNOSIS — D631 Anemia in chronic kidney disease: Secondary | ICD-10-CM | POA: Diagnosis not present

## 2018-07-06 DIAGNOSIS — N2581 Secondary hyperparathyroidism of renal origin: Secondary | ICD-10-CM | POA: Diagnosis not present

## 2018-07-06 DIAGNOSIS — K769 Liver disease, unspecified: Secondary | ICD-10-CM | POA: Diagnosis not present

## 2018-07-06 DIAGNOSIS — D509 Iron deficiency anemia, unspecified: Secondary | ICD-10-CM | POA: Diagnosis not present

## 2018-07-06 DIAGNOSIS — E440 Moderate protein-calorie malnutrition: Secondary | ICD-10-CM | POA: Diagnosis not present

## 2018-07-06 DIAGNOSIS — N186 End stage renal disease: Secondary | ICD-10-CM | POA: Diagnosis not present

## 2018-07-06 DIAGNOSIS — D631 Anemia in chronic kidney disease: Secondary | ICD-10-CM | POA: Diagnosis not present

## 2018-07-07 DIAGNOSIS — D631 Anemia in chronic kidney disease: Secondary | ICD-10-CM | POA: Diagnosis not present

## 2018-07-07 DIAGNOSIS — D509 Iron deficiency anemia, unspecified: Secondary | ICD-10-CM | POA: Diagnosis not present

## 2018-07-07 DIAGNOSIS — K769 Liver disease, unspecified: Secondary | ICD-10-CM | POA: Diagnosis not present

## 2018-07-07 DIAGNOSIS — R82998 Other abnormal findings in urine: Secondary | ICD-10-CM | POA: Diagnosis not present

## 2018-07-07 DIAGNOSIS — E440 Moderate protein-calorie malnutrition: Secondary | ICD-10-CM | POA: Diagnosis not present

## 2018-07-07 DIAGNOSIS — N2581 Secondary hyperparathyroidism of renal origin: Secondary | ICD-10-CM | POA: Diagnosis not present

## 2018-07-07 DIAGNOSIS — N186 End stage renal disease: Secondary | ICD-10-CM | POA: Diagnosis not present

## 2018-07-08 DIAGNOSIS — N186 End stage renal disease: Secondary | ICD-10-CM | POA: Diagnosis not present

## 2018-07-08 DIAGNOSIS — D631 Anemia in chronic kidney disease: Secondary | ICD-10-CM | POA: Diagnosis not present

## 2018-07-08 DIAGNOSIS — N2581 Secondary hyperparathyroidism of renal origin: Secondary | ICD-10-CM | POA: Diagnosis not present

## 2018-07-08 DIAGNOSIS — D509 Iron deficiency anemia, unspecified: Secondary | ICD-10-CM | POA: Diagnosis not present

## 2018-07-08 DIAGNOSIS — K769 Liver disease, unspecified: Secondary | ICD-10-CM | POA: Diagnosis not present

## 2018-07-08 DIAGNOSIS — E440 Moderate protein-calorie malnutrition: Secondary | ICD-10-CM | POA: Diagnosis not present

## 2018-07-09 DIAGNOSIS — E440 Moderate protein-calorie malnutrition: Secondary | ICD-10-CM | POA: Diagnosis not present

## 2018-07-09 DIAGNOSIS — D509 Iron deficiency anemia, unspecified: Secondary | ICD-10-CM | POA: Diagnosis not present

## 2018-07-09 DIAGNOSIS — N186 End stage renal disease: Secondary | ICD-10-CM | POA: Diagnosis not present

## 2018-07-09 DIAGNOSIS — N2581 Secondary hyperparathyroidism of renal origin: Secondary | ICD-10-CM | POA: Diagnosis not present

## 2018-07-09 DIAGNOSIS — K769 Liver disease, unspecified: Secondary | ICD-10-CM | POA: Diagnosis not present

## 2018-07-09 DIAGNOSIS — D631 Anemia in chronic kidney disease: Secondary | ICD-10-CM | POA: Diagnosis not present

## 2018-07-10 DIAGNOSIS — N186 End stage renal disease: Secondary | ICD-10-CM | POA: Diagnosis not present

## 2018-07-10 DIAGNOSIS — D509 Iron deficiency anemia, unspecified: Secondary | ICD-10-CM | POA: Diagnosis not present

## 2018-07-10 DIAGNOSIS — E440 Moderate protein-calorie malnutrition: Secondary | ICD-10-CM | POA: Diagnosis not present

## 2018-07-10 DIAGNOSIS — K769 Liver disease, unspecified: Secondary | ICD-10-CM | POA: Diagnosis not present

## 2018-07-10 DIAGNOSIS — N2581 Secondary hyperparathyroidism of renal origin: Secondary | ICD-10-CM | POA: Diagnosis not present

## 2018-07-10 DIAGNOSIS — D631 Anemia in chronic kidney disease: Secondary | ICD-10-CM | POA: Diagnosis not present

## 2018-07-11 DIAGNOSIS — K769 Liver disease, unspecified: Secondary | ICD-10-CM | POA: Diagnosis not present

## 2018-07-11 DIAGNOSIS — E440 Moderate protein-calorie malnutrition: Secondary | ICD-10-CM | POA: Diagnosis not present

## 2018-07-11 DIAGNOSIS — N2581 Secondary hyperparathyroidism of renal origin: Secondary | ICD-10-CM | POA: Diagnosis not present

## 2018-07-11 DIAGNOSIS — N186 End stage renal disease: Secondary | ICD-10-CM | POA: Diagnosis not present

## 2018-07-11 DIAGNOSIS — D509 Iron deficiency anemia, unspecified: Secondary | ICD-10-CM | POA: Diagnosis not present

## 2018-07-11 DIAGNOSIS — D631 Anemia in chronic kidney disease: Secondary | ICD-10-CM | POA: Diagnosis not present

## 2018-07-12 DIAGNOSIS — K769 Liver disease, unspecified: Secondary | ICD-10-CM | POA: Diagnosis not present

## 2018-07-12 DIAGNOSIS — E440 Moderate protein-calorie malnutrition: Secondary | ICD-10-CM | POA: Diagnosis not present

## 2018-07-12 DIAGNOSIS — N2581 Secondary hyperparathyroidism of renal origin: Secondary | ICD-10-CM | POA: Diagnosis not present

## 2018-07-12 DIAGNOSIS — N186 End stage renal disease: Secondary | ICD-10-CM | POA: Diagnosis not present

## 2018-07-12 DIAGNOSIS — D509 Iron deficiency anemia, unspecified: Secondary | ICD-10-CM | POA: Diagnosis not present

## 2018-07-12 DIAGNOSIS — D631 Anemia in chronic kidney disease: Secondary | ICD-10-CM | POA: Diagnosis not present

## 2018-07-13 DIAGNOSIS — D509 Iron deficiency anemia, unspecified: Secondary | ICD-10-CM | POA: Diagnosis not present

## 2018-07-13 DIAGNOSIS — D631 Anemia in chronic kidney disease: Secondary | ICD-10-CM | POA: Diagnosis not present

## 2018-07-13 DIAGNOSIS — K769 Liver disease, unspecified: Secondary | ICD-10-CM | POA: Diagnosis not present

## 2018-07-13 DIAGNOSIS — N2581 Secondary hyperparathyroidism of renal origin: Secondary | ICD-10-CM | POA: Diagnosis not present

## 2018-07-13 DIAGNOSIS — N186 End stage renal disease: Secondary | ICD-10-CM | POA: Diagnosis not present

## 2018-07-13 DIAGNOSIS — E440 Moderate protein-calorie malnutrition: Secondary | ICD-10-CM | POA: Diagnosis not present

## 2018-07-14 DIAGNOSIS — K769 Liver disease, unspecified: Secondary | ICD-10-CM | POA: Diagnosis not present

## 2018-07-14 DIAGNOSIS — N2581 Secondary hyperparathyroidism of renal origin: Secondary | ICD-10-CM | POA: Diagnosis not present

## 2018-07-14 DIAGNOSIS — D509 Iron deficiency anemia, unspecified: Secondary | ICD-10-CM | POA: Diagnosis not present

## 2018-07-14 DIAGNOSIS — E440 Moderate protein-calorie malnutrition: Secondary | ICD-10-CM | POA: Diagnosis not present

## 2018-07-14 DIAGNOSIS — N186 End stage renal disease: Secondary | ICD-10-CM | POA: Diagnosis not present

## 2018-07-14 DIAGNOSIS — D631 Anemia in chronic kidney disease: Secondary | ICD-10-CM | POA: Diagnosis not present

## 2018-07-15 DIAGNOSIS — K769 Liver disease, unspecified: Secondary | ICD-10-CM | POA: Diagnosis not present

## 2018-07-15 DIAGNOSIS — D509 Iron deficiency anemia, unspecified: Secondary | ICD-10-CM | POA: Diagnosis not present

## 2018-07-15 DIAGNOSIS — E440 Moderate protein-calorie malnutrition: Secondary | ICD-10-CM | POA: Diagnosis not present

## 2018-07-15 DIAGNOSIS — N186 End stage renal disease: Secondary | ICD-10-CM | POA: Diagnosis not present

## 2018-07-15 DIAGNOSIS — D631 Anemia in chronic kidney disease: Secondary | ICD-10-CM | POA: Diagnosis not present

## 2018-07-15 DIAGNOSIS — N2581 Secondary hyperparathyroidism of renal origin: Secondary | ICD-10-CM | POA: Diagnosis not present

## 2018-07-16 DIAGNOSIS — N186 End stage renal disease: Secondary | ICD-10-CM | POA: Diagnosis not present

## 2018-07-16 DIAGNOSIS — N2581 Secondary hyperparathyroidism of renal origin: Secondary | ICD-10-CM | POA: Diagnosis not present

## 2018-07-16 DIAGNOSIS — E440 Moderate protein-calorie malnutrition: Secondary | ICD-10-CM | POA: Diagnosis not present

## 2018-07-16 DIAGNOSIS — D631 Anemia in chronic kidney disease: Secondary | ICD-10-CM | POA: Diagnosis not present

## 2018-07-16 DIAGNOSIS — D509 Iron deficiency anemia, unspecified: Secondary | ICD-10-CM | POA: Diagnosis not present

## 2018-07-16 DIAGNOSIS — K769 Liver disease, unspecified: Secondary | ICD-10-CM | POA: Diagnosis not present

## 2018-07-17 DIAGNOSIS — K769 Liver disease, unspecified: Secondary | ICD-10-CM | POA: Diagnosis not present

## 2018-07-17 DIAGNOSIS — D509 Iron deficiency anemia, unspecified: Secondary | ICD-10-CM | POA: Diagnosis not present

## 2018-07-17 DIAGNOSIS — E440 Moderate protein-calorie malnutrition: Secondary | ICD-10-CM | POA: Diagnosis not present

## 2018-07-17 DIAGNOSIS — N186 End stage renal disease: Secondary | ICD-10-CM | POA: Diagnosis not present

## 2018-07-17 DIAGNOSIS — D631 Anemia in chronic kidney disease: Secondary | ICD-10-CM | POA: Diagnosis not present

## 2018-07-17 DIAGNOSIS — N2581 Secondary hyperparathyroidism of renal origin: Secondary | ICD-10-CM | POA: Diagnosis not present

## 2018-07-18 DIAGNOSIS — E440 Moderate protein-calorie malnutrition: Secondary | ICD-10-CM | POA: Diagnosis not present

## 2018-07-18 DIAGNOSIS — D509 Iron deficiency anemia, unspecified: Secondary | ICD-10-CM | POA: Diagnosis not present

## 2018-07-18 DIAGNOSIS — D631 Anemia in chronic kidney disease: Secondary | ICD-10-CM | POA: Diagnosis not present

## 2018-07-18 DIAGNOSIS — N186 End stage renal disease: Secondary | ICD-10-CM | POA: Diagnosis not present

## 2018-07-18 DIAGNOSIS — K769 Liver disease, unspecified: Secondary | ICD-10-CM | POA: Diagnosis not present

## 2018-07-18 DIAGNOSIS — N2581 Secondary hyperparathyroidism of renal origin: Secondary | ICD-10-CM | POA: Diagnosis not present

## 2018-07-19 DIAGNOSIS — E440 Moderate protein-calorie malnutrition: Secondary | ICD-10-CM | POA: Diagnosis not present

## 2018-07-19 DIAGNOSIS — K769 Liver disease, unspecified: Secondary | ICD-10-CM | POA: Diagnosis not present

## 2018-07-19 DIAGNOSIS — D509 Iron deficiency anemia, unspecified: Secondary | ICD-10-CM | POA: Diagnosis not present

## 2018-07-19 DIAGNOSIS — N186 End stage renal disease: Secondary | ICD-10-CM | POA: Diagnosis not present

## 2018-07-19 DIAGNOSIS — D631 Anemia in chronic kidney disease: Secondary | ICD-10-CM | POA: Diagnosis not present

## 2018-07-19 DIAGNOSIS — N2581 Secondary hyperparathyroidism of renal origin: Secondary | ICD-10-CM | POA: Diagnosis not present

## 2018-07-20 DIAGNOSIS — D509 Iron deficiency anemia, unspecified: Secondary | ICD-10-CM | POA: Diagnosis not present

## 2018-07-20 DIAGNOSIS — N186 End stage renal disease: Secondary | ICD-10-CM | POA: Diagnosis not present

## 2018-07-20 DIAGNOSIS — K769 Liver disease, unspecified: Secondary | ICD-10-CM | POA: Diagnosis not present

## 2018-07-20 DIAGNOSIS — E440 Moderate protein-calorie malnutrition: Secondary | ICD-10-CM | POA: Diagnosis not present

## 2018-07-20 DIAGNOSIS — D631 Anemia in chronic kidney disease: Secondary | ICD-10-CM | POA: Diagnosis not present

## 2018-07-20 DIAGNOSIS — N2581 Secondary hyperparathyroidism of renal origin: Secondary | ICD-10-CM | POA: Diagnosis not present

## 2018-07-21 DIAGNOSIS — D509 Iron deficiency anemia, unspecified: Secondary | ICD-10-CM | POA: Diagnosis not present

## 2018-07-21 DIAGNOSIS — D631 Anemia in chronic kidney disease: Secondary | ICD-10-CM | POA: Diagnosis not present

## 2018-07-21 DIAGNOSIS — K769 Liver disease, unspecified: Secondary | ICD-10-CM | POA: Diagnosis not present

## 2018-07-21 DIAGNOSIS — N2581 Secondary hyperparathyroidism of renal origin: Secondary | ICD-10-CM | POA: Diagnosis not present

## 2018-07-21 DIAGNOSIS — N186 End stage renal disease: Secondary | ICD-10-CM | POA: Diagnosis not present

## 2018-07-21 DIAGNOSIS — E440 Moderate protein-calorie malnutrition: Secondary | ICD-10-CM | POA: Diagnosis not present

## 2018-07-22 DIAGNOSIS — D509 Iron deficiency anemia, unspecified: Secondary | ICD-10-CM | POA: Diagnosis not present

## 2018-07-22 DIAGNOSIS — N2581 Secondary hyperparathyroidism of renal origin: Secondary | ICD-10-CM | POA: Diagnosis not present

## 2018-07-22 DIAGNOSIS — D631 Anemia in chronic kidney disease: Secondary | ICD-10-CM | POA: Diagnosis not present

## 2018-07-22 DIAGNOSIS — N186 End stage renal disease: Secondary | ICD-10-CM | POA: Diagnosis not present

## 2018-07-22 DIAGNOSIS — E440 Moderate protein-calorie malnutrition: Secondary | ICD-10-CM | POA: Diagnosis not present

## 2018-07-22 DIAGNOSIS — K769 Liver disease, unspecified: Secondary | ICD-10-CM | POA: Diagnosis not present

## 2018-07-23 DIAGNOSIS — D509 Iron deficiency anemia, unspecified: Secondary | ICD-10-CM | POA: Diagnosis not present

## 2018-07-23 DIAGNOSIS — E440 Moderate protein-calorie malnutrition: Secondary | ICD-10-CM | POA: Diagnosis not present

## 2018-07-23 DIAGNOSIS — N2581 Secondary hyperparathyroidism of renal origin: Secondary | ICD-10-CM | POA: Diagnosis not present

## 2018-07-23 DIAGNOSIS — D631 Anemia in chronic kidney disease: Secondary | ICD-10-CM | POA: Diagnosis not present

## 2018-07-23 DIAGNOSIS — N186 End stage renal disease: Secondary | ICD-10-CM | POA: Diagnosis not present

## 2018-07-23 DIAGNOSIS — K769 Liver disease, unspecified: Secondary | ICD-10-CM | POA: Diagnosis not present

## 2018-07-24 DIAGNOSIS — D631 Anemia in chronic kidney disease: Secondary | ICD-10-CM | POA: Diagnosis not present

## 2018-07-24 DIAGNOSIS — E440 Moderate protein-calorie malnutrition: Secondary | ICD-10-CM | POA: Diagnosis not present

## 2018-07-24 DIAGNOSIS — N186 End stage renal disease: Secondary | ICD-10-CM | POA: Diagnosis not present

## 2018-07-24 DIAGNOSIS — K769 Liver disease, unspecified: Secondary | ICD-10-CM | POA: Diagnosis not present

## 2018-07-24 DIAGNOSIS — N2581 Secondary hyperparathyroidism of renal origin: Secondary | ICD-10-CM | POA: Diagnosis not present

## 2018-07-24 DIAGNOSIS — D509 Iron deficiency anemia, unspecified: Secondary | ICD-10-CM | POA: Diagnosis not present

## 2018-07-25 DIAGNOSIS — N2581 Secondary hyperparathyroidism of renal origin: Secondary | ICD-10-CM | POA: Diagnosis not present

## 2018-07-25 DIAGNOSIS — D509 Iron deficiency anemia, unspecified: Secondary | ICD-10-CM | POA: Diagnosis not present

## 2018-07-25 DIAGNOSIS — E440 Moderate protein-calorie malnutrition: Secondary | ICD-10-CM | POA: Diagnosis not present

## 2018-07-25 DIAGNOSIS — D631 Anemia in chronic kidney disease: Secondary | ICD-10-CM | POA: Diagnosis not present

## 2018-07-25 DIAGNOSIS — K769 Liver disease, unspecified: Secondary | ICD-10-CM | POA: Diagnosis not present

## 2018-07-25 DIAGNOSIS — N186 End stage renal disease: Secondary | ICD-10-CM | POA: Diagnosis not present

## 2018-07-26 DIAGNOSIS — K769 Liver disease, unspecified: Secondary | ICD-10-CM | POA: Diagnosis not present

## 2018-07-26 DIAGNOSIS — D631 Anemia in chronic kidney disease: Secondary | ICD-10-CM | POA: Diagnosis not present

## 2018-07-26 DIAGNOSIS — E440 Moderate protein-calorie malnutrition: Secondary | ICD-10-CM | POA: Diagnosis not present

## 2018-07-26 DIAGNOSIS — N186 End stage renal disease: Secondary | ICD-10-CM | POA: Diagnosis not present

## 2018-07-26 DIAGNOSIS — N2581 Secondary hyperparathyroidism of renal origin: Secondary | ICD-10-CM | POA: Diagnosis not present

## 2018-07-26 DIAGNOSIS — D509 Iron deficiency anemia, unspecified: Secondary | ICD-10-CM | POA: Diagnosis not present

## 2018-07-27 DIAGNOSIS — D631 Anemia in chronic kidney disease: Secondary | ICD-10-CM | POA: Diagnosis not present

## 2018-07-27 DIAGNOSIS — D509 Iron deficiency anemia, unspecified: Secondary | ICD-10-CM | POA: Diagnosis not present

## 2018-07-27 DIAGNOSIS — N186 End stage renal disease: Secondary | ICD-10-CM | POA: Diagnosis not present

## 2018-07-27 DIAGNOSIS — N2581 Secondary hyperparathyroidism of renal origin: Secondary | ICD-10-CM | POA: Diagnosis not present

## 2018-07-27 DIAGNOSIS — K769 Liver disease, unspecified: Secondary | ICD-10-CM | POA: Diagnosis not present

## 2018-07-27 DIAGNOSIS — E440 Moderate protein-calorie malnutrition: Secondary | ICD-10-CM | POA: Diagnosis not present

## 2018-07-28 DIAGNOSIS — N186 End stage renal disease: Secondary | ICD-10-CM | POA: Diagnosis not present

## 2018-07-28 DIAGNOSIS — D509 Iron deficiency anemia, unspecified: Secondary | ICD-10-CM | POA: Diagnosis not present

## 2018-07-28 DIAGNOSIS — E440 Moderate protein-calorie malnutrition: Secondary | ICD-10-CM | POA: Diagnosis not present

## 2018-07-28 DIAGNOSIS — N2581 Secondary hyperparathyroidism of renal origin: Secondary | ICD-10-CM | POA: Diagnosis not present

## 2018-07-28 DIAGNOSIS — K769 Liver disease, unspecified: Secondary | ICD-10-CM | POA: Diagnosis not present

## 2018-07-28 DIAGNOSIS — D631 Anemia in chronic kidney disease: Secondary | ICD-10-CM | POA: Diagnosis not present

## 2018-07-29 DIAGNOSIS — N186 End stage renal disease: Secondary | ICD-10-CM | POA: Diagnosis not present

## 2018-07-29 DIAGNOSIS — E440 Moderate protein-calorie malnutrition: Secondary | ICD-10-CM | POA: Diagnosis not present

## 2018-07-29 DIAGNOSIS — N2581 Secondary hyperparathyroidism of renal origin: Secondary | ICD-10-CM | POA: Diagnosis not present

## 2018-07-29 DIAGNOSIS — K769 Liver disease, unspecified: Secondary | ICD-10-CM | POA: Diagnosis not present

## 2018-07-29 DIAGNOSIS — D631 Anemia in chronic kidney disease: Secondary | ICD-10-CM | POA: Diagnosis not present

## 2018-07-29 DIAGNOSIS — D509 Iron deficiency anemia, unspecified: Secondary | ICD-10-CM | POA: Diagnosis not present

## 2018-07-30 DIAGNOSIS — N186 End stage renal disease: Secondary | ICD-10-CM | POA: Diagnosis not present

## 2018-07-30 DIAGNOSIS — D509 Iron deficiency anemia, unspecified: Secondary | ICD-10-CM | POA: Diagnosis not present

## 2018-07-30 DIAGNOSIS — N2581 Secondary hyperparathyroidism of renal origin: Secondary | ICD-10-CM | POA: Diagnosis not present

## 2018-07-30 DIAGNOSIS — D631 Anemia in chronic kidney disease: Secondary | ICD-10-CM | POA: Diagnosis not present

## 2018-07-30 DIAGNOSIS — E440 Moderate protein-calorie malnutrition: Secondary | ICD-10-CM | POA: Diagnosis not present

## 2018-07-30 DIAGNOSIS — K769 Liver disease, unspecified: Secondary | ICD-10-CM | POA: Diagnosis not present

## 2018-07-31 DIAGNOSIS — D509 Iron deficiency anemia, unspecified: Secondary | ICD-10-CM | POA: Diagnosis not present

## 2018-07-31 DIAGNOSIS — K769 Liver disease, unspecified: Secondary | ICD-10-CM | POA: Diagnosis not present

## 2018-07-31 DIAGNOSIS — E440 Moderate protein-calorie malnutrition: Secondary | ICD-10-CM | POA: Diagnosis not present

## 2018-07-31 DIAGNOSIS — D631 Anemia in chronic kidney disease: Secondary | ICD-10-CM | POA: Diagnosis not present

## 2018-07-31 DIAGNOSIS — N186 End stage renal disease: Secondary | ICD-10-CM | POA: Diagnosis not present

## 2018-07-31 DIAGNOSIS — N2581 Secondary hyperparathyroidism of renal origin: Secondary | ICD-10-CM | POA: Diagnosis not present

## 2018-08-01 DIAGNOSIS — E440 Moderate protein-calorie malnutrition: Secondary | ICD-10-CM | POA: Diagnosis not present

## 2018-08-01 DIAGNOSIS — K769 Liver disease, unspecified: Secondary | ICD-10-CM | POA: Diagnosis not present

## 2018-08-01 DIAGNOSIS — D631 Anemia in chronic kidney disease: Secondary | ICD-10-CM | POA: Diagnosis not present

## 2018-08-01 DIAGNOSIS — N186 End stage renal disease: Secondary | ICD-10-CM | POA: Diagnosis not present

## 2018-08-01 DIAGNOSIS — N2581 Secondary hyperparathyroidism of renal origin: Secondary | ICD-10-CM | POA: Diagnosis not present

## 2018-08-01 DIAGNOSIS — D509 Iron deficiency anemia, unspecified: Secondary | ICD-10-CM | POA: Diagnosis not present

## 2018-08-02 DIAGNOSIS — N2581 Secondary hyperparathyroidism of renal origin: Secondary | ICD-10-CM | POA: Diagnosis not present

## 2018-08-02 DIAGNOSIS — E440 Moderate protein-calorie malnutrition: Secondary | ICD-10-CM | POA: Diagnosis not present

## 2018-08-02 DIAGNOSIS — K769 Liver disease, unspecified: Secondary | ICD-10-CM | POA: Diagnosis not present

## 2018-08-02 DIAGNOSIS — D509 Iron deficiency anemia, unspecified: Secondary | ICD-10-CM | POA: Diagnosis not present

## 2018-08-02 DIAGNOSIS — N186 End stage renal disease: Secondary | ICD-10-CM | POA: Diagnosis not present

## 2018-08-02 DIAGNOSIS — D631 Anemia in chronic kidney disease: Secondary | ICD-10-CM | POA: Diagnosis not present

## 2018-08-03 DIAGNOSIS — D631 Anemia in chronic kidney disease: Secondary | ICD-10-CM | POA: Diagnosis not present

## 2018-08-03 DIAGNOSIS — N186 End stage renal disease: Secondary | ICD-10-CM | POA: Diagnosis not present

## 2018-08-03 DIAGNOSIS — D509 Iron deficiency anemia, unspecified: Secondary | ICD-10-CM | POA: Diagnosis not present

## 2018-08-03 DIAGNOSIS — N2581 Secondary hyperparathyroidism of renal origin: Secondary | ICD-10-CM | POA: Diagnosis not present

## 2018-08-03 DIAGNOSIS — K769 Liver disease, unspecified: Secondary | ICD-10-CM | POA: Diagnosis not present

## 2018-08-03 DIAGNOSIS — E440 Moderate protein-calorie malnutrition: Secondary | ICD-10-CM | POA: Diagnosis not present

## 2018-08-04 DIAGNOSIS — I129 Hypertensive chronic kidney disease with stage 1 through stage 4 chronic kidney disease, or unspecified chronic kidney disease: Secondary | ICD-10-CM | POA: Diagnosis not present

## 2018-08-04 DIAGNOSIS — D631 Anemia in chronic kidney disease: Secondary | ICD-10-CM | POA: Diagnosis not present

## 2018-08-04 DIAGNOSIS — Z4932 Encounter for adequacy testing for peritoneal dialysis: Secondary | ICD-10-CM | POA: Diagnosis not present

## 2018-08-04 DIAGNOSIS — K769 Liver disease, unspecified: Secondary | ICD-10-CM | POA: Diagnosis not present

## 2018-08-04 DIAGNOSIS — Z992 Dependence on renal dialysis: Secondary | ICD-10-CM | POA: Diagnosis not present

## 2018-08-04 DIAGNOSIS — N186 End stage renal disease: Secondary | ICD-10-CM | POA: Diagnosis not present

## 2018-08-04 DIAGNOSIS — D509 Iron deficiency anemia, unspecified: Secondary | ICD-10-CM | POA: Diagnosis not present

## 2018-08-04 DIAGNOSIS — N2581 Secondary hyperparathyroidism of renal origin: Secondary | ICD-10-CM | POA: Diagnosis not present

## 2018-08-04 DIAGNOSIS — N2589 Other disorders resulting from impaired renal tubular function: Secondary | ICD-10-CM | POA: Diagnosis not present

## 2018-08-05 DIAGNOSIS — N2589 Other disorders resulting from impaired renal tubular function: Secondary | ICD-10-CM | POA: Diagnosis not present

## 2018-08-05 DIAGNOSIS — Z4932 Encounter for adequacy testing for peritoneal dialysis: Secondary | ICD-10-CM | POA: Diagnosis not present

## 2018-08-05 DIAGNOSIS — D631 Anemia in chronic kidney disease: Secondary | ICD-10-CM | POA: Diagnosis not present

## 2018-08-05 DIAGNOSIS — N2581 Secondary hyperparathyroidism of renal origin: Secondary | ICD-10-CM | POA: Diagnosis not present

## 2018-08-05 DIAGNOSIS — N186 End stage renal disease: Secondary | ICD-10-CM | POA: Diagnosis not present

## 2018-08-05 DIAGNOSIS — D509 Iron deficiency anemia, unspecified: Secondary | ICD-10-CM | POA: Diagnosis not present

## 2018-08-06 DIAGNOSIS — D631 Anemia in chronic kidney disease: Secondary | ICD-10-CM | POA: Diagnosis not present

## 2018-08-06 DIAGNOSIS — N2581 Secondary hyperparathyroidism of renal origin: Secondary | ICD-10-CM | POA: Diagnosis not present

## 2018-08-06 DIAGNOSIS — N186 End stage renal disease: Secondary | ICD-10-CM | POA: Diagnosis not present

## 2018-08-06 DIAGNOSIS — N2589 Other disorders resulting from impaired renal tubular function: Secondary | ICD-10-CM | POA: Diagnosis not present

## 2018-08-06 DIAGNOSIS — Z4932 Encounter for adequacy testing for peritoneal dialysis: Secondary | ICD-10-CM | POA: Diagnosis not present

## 2018-08-06 DIAGNOSIS — D509 Iron deficiency anemia, unspecified: Secondary | ICD-10-CM | POA: Diagnosis not present

## 2018-08-07 DIAGNOSIS — N2589 Other disorders resulting from impaired renal tubular function: Secondary | ICD-10-CM | POA: Diagnosis not present

## 2018-08-07 DIAGNOSIS — N186 End stage renal disease: Secondary | ICD-10-CM | POA: Diagnosis not present

## 2018-08-07 DIAGNOSIS — D509 Iron deficiency anemia, unspecified: Secondary | ICD-10-CM | POA: Diagnosis not present

## 2018-08-07 DIAGNOSIS — D631 Anemia in chronic kidney disease: Secondary | ICD-10-CM | POA: Diagnosis not present

## 2018-08-07 DIAGNOSIS — N2581 Secondary hyperparathyroidism of renal origin: Secondary | ICD-10-CM | POA: Diagnosis not present

## 2018-08-07 DIAGNOSIS — Z4932 Encounter for adequacy testing for peritoneal dialysis: Secondary | ICD-10-CM | POA: Diagnosis not present

## 2018-08-08 DIAGNOSIS — D509 Iron deficiency anemia, unspecified: Secondary | ICD-10-CM | POA: Diagnosis not present

## 2018-08-08 DIAGNOSIS — N2581 Secondary hyperparathyroidism of renal origin: Secondary | ICD-10-CM | POA: Diagnosis not present

## 2018-08-08 DIAGNOSIS — Z4932 Encounter for adequacy testing for peritoneal dialysis: Secondary | ICD-10-CM | POA: Diagnosis not present

## 2018-08-08 DIAGNOSIS — D631 Anemia in chronic kidney disease: Secondary | ICD-10-CM | POA: Diagnosis not present

## 2018-08-08 DIAGNOSIS — N186 End stage renal disease: Secondary | ICD-10-CM | POA: Diagnosis not present

## 2018-08-08 DIAGNOSIS — N2589 Other disorders resulting from impaired renal tubular function: Secondary | ICD-10-CM | POA: Diagnosis not present

## 2018-08-09 DIAGNOSIS — D631 Anemia in chronic kidney disease: Secondary | ICD-10-CM | POA: Diagnosis not present

## 2018-08-09 DIAGNOSIS — D509 Iron deficiency anemia, unspecified: Secondary | ICD-10-CM | POA: Diagnosis not present

## 2018-08-09 DIAGNOSIS — R82998 Other abnormal findings in urine: Secondary | ICD-10-CM | POA: Diagnosis not present

## 2018-08-09 DIAGNOSIS — Z4932 Encounter for adequacy testing for peritoneal dialysis: Secondary | ICD-10-CM | POA: Diagnosis not present

## 2018-08-09 DIAGNOSIS — E7849 Other hyperlipidemia: Secondary | ICD-10-CM | POA: Diagnosis not present

## 2018-08-09 DIAGNOSIS — N186 End stage renal disease: Secondary | ICD-10-CM | POA: Diagnosis not present

## 2018-08-09 DIAGNOSIS — E1129 Type 2 diabetes mellitus with other diabetic kidney complication: Secondary | ICD-10-CM | POA: Diagnosis not present

## 2018-08-09 DIAGNOSIS — N2589 Other disorders resulting from impaired renal tubular function: Secondary | ICD-10-CM | POA: Diagnosis not present

## 2018-08-09 DIAGNOSIS — N2581 Secondary hyperparathyroidism of renal origin: Secondary | ICD-10-CM | POA: Diagnosis not present

## 2018-08-10 DIAGNOSIS — D509 Iron deficiency anemia, unspecified: Secondary | ICD-10-CM | POA: Diagnosis not present

## 2018-08-10 DIAGNOSIS — Z4932 Encounter for adequacy testing for peritoneal dialysis: Secondary | ICD-10-CM | POA: Diagnosis not present

## 2018-08-10 DIAGNOSIS — N2581 Secondary hyperparathyroidism of renal origin: Secondary | ICD-10-CM | POA: Diagnosis not present

## 2018-08-10 DIAGNOSIS — D631 Anemia in chronic kidney disease: Secondary | ICD-10-CM | POA: Diagnosis not present

## 2018-08-10 DIAGNOSIS — N2589 Other disorders resulting from impaired renal tubular function: Secondary | ICD-10-CM | POA: Diagnosis not present

## 2018-08-10 DIAGNOSIS — N186 End stage renal disease: Secondary | ICD-10-CM | POA: Diagnosis not present

## 2018-08-11 DIAGNOSIS — N2581 Secondary hyperparathyroidism of renal origin: Secondary | ICD-10-CM | POA: Diagnosis not present

## 2018-08-11 DIAGNOSIS — D509 Iron deficiency anemia, unspecified: Secondary | ICD-10-CM | POA: Diagnosis not present

## 2018-08-11 DIAGNOSIS — N186 End stage renal disease: Secondary | ICD-10-CM | POA: Diagnosis not present

## 2018-08-11 DIAGNOSIS — Z4932 Encounter for adequacy testing for peritoneal dialysis: Secondary | ICD-10-CM | POA: Diagnosis not present

## 2018-08-11 DIAGNOSIS — D631 Anemia in chronic kidney disease: Secondary | ICD-10-CM | POA: Diagnosis not present

## 2018-08-11 DIAGNOSIS — N2589 Other disorders resulting from impaired renal tubular function: Secondary | ICD-10-CM | POA: Diagnosis not present

## 2018-08-12 DIAGNOSIS — N2581 Secondary hyperparathyroidism of renal origin: Secondary | ICD-10-CM | POA: Diagnosis not present

## 2018-08-12 DIAGNOSIS — N186 End stage renal disease: Secondary | ICD-10-CM | POA: Diagnosis not present

## 2018-08-12 DIAGNOSIS — D631 Anemia in chronic kidney disease: Secondary | ICD-10-CM | POA: Diagnosis not present

## 2018-08-12 DIAGNOSIS — D509 Iron deficiency anemia, unspecified: Secondary | ICD-10-CM | POA: Diagnosis not present

## 2018-08-12 DIAGNOSIS — Z4932 Encounter for adequacy testing for peritoneal dialysis: Secondary | ICD-10-CM | POA: Diagnosis not present

## 2018-08-12 DIAGNOSIS — N2589 Other disorders resulting from impaired renal tubular function: Secondary | ICD-10-CM | POA: Diagnosis not present

## 2018-08-13 DIAGNOSIS — D509 Iron deficiency anemia, unspecified: Secondary | ICD-10-CM | POA: Diagnosis not present

## 2018-08-13 DIAGNOSIS — N186 End stage renal disease: Secondary | ICD-10-CM | POA: Diagnosis not present

## 2018-08-13 DIAGNOSIS — N2589 Other disorders resulting from impaired renal tubular function: Secondary | ICD-10-CM | POA: Diagnosis not present

## 2018-08-13 DIAGNOSIS — N2581 Secondary hyperparathyroidism of renal origin: Secondary | ICD-10-CM | POA: Diagnosis not present

## 2018-08-13 DIAGNOSIS — D631 Anemia in chronic kidney disease: Secondary | ICD-10-CM | POA: Diagnosis not present

## 2018-08-13 DIAGNOSIS — Z4932 Encounter for adequacy testing for peritoneal dialysis: Secondary | ICD-10-CM | POA: Diagnosis not present

## 2018-08-14 DIAGNOSIS — D631 Anemia in chronic kidney disease: Secondary | ICD-10-CM | POA: Diagnosis not present

## 2018-08-14 DIAGNOSIS — N2581 Secondary hyperparathyroidism of renal origin: Secondary | ICD-10-CM | POA: Diagnosis not present

## 2018-08-14 DIAGNOSIS — N2589 Other disorders resulting from impaired renal tubular function: Secondary | ICD-10-CM | POA: Diagnosis not present

## 2018-08-14 DIAGNOSIS — N186 End stage renal disease: Secondary | ICD-10-CM | POA: Diagnosis not present

## 2018-08-14 DIAGNOSIS — Z4932 Encounter for adequacy testing for peritoneal dialysis: Secondary | ICD-10-CM | POA: Diagnosis not present

## 2018-08-14 DIAGNOSIS — D509 Iron deficiency anemia, unspecified: Secondary | ICD-10-CM | POA: Diagnosis not present

## 2018-08-15 DIAGNOSIS — N2589 Other disorders resulting from impaired renal tubular function: Secondary | ICD-10-CM | POA: Diagnosis not present

## 2018-08-15 DIAGNOSIS — N186 End stage renal disease: Secondary | ICD-10-CM | POA: Diagnosis not present

## 2018-08-15 DIAGNOSIS — Z4932 Encounter for adequacy testing for peritoneal dialysis: Secondary | ICD-10-CM | POA: Diagnosis not present

## 2018-08-15 DIAGNOSIS — D509 Iron deficiency anemia, unspecified: Secondary | ICD-10-CM | POA: Diagnosis not present

## 2018-08-15 DIAGNOSIS — D631 Anemia in chronic kidney disease: Secondary | ICD-10-CM | POA: Diagnosis not present

## 2018-08-15 DIAGNOSIS — N2581 Secondary hyperparathyroidism of renal origin: Secondary | ICD-10-CM | POA: Diagnosis not present

## 2018-08-16 DIAGNOSIS — D509 Iron deficiency anemia, unspecified: Secondary | ICD-10-CM | POA: Diagnosis not present

## 2018-08-16 DIAGNOSIS — D631 Anemia in chronic kidney disease: Secondary | ICD-10-CM | POA: Diagnosis not present

## 2018-08-16 DIAGNOSIS — N2581 Secondary hyperparathyroidism of renal origin: Secondary | ICD-10-CM | POA: Diagnosis not present

## 2018-08-16 DIAGNOSIS — Z4932 Encounter for adequacy testing for peritoneal dialysis: Secondary | ICD-10-CM | POA: Diagnosis not present

## 2018-08-16 DIAGNOSIS — N186 End stage renal disease: Secondary | ICD-10-CM | POA: Diagnosis not present

## 2018-08-16 DIAGNOSIS — N2589 Other disorders resulting from impaired renal tubular function: Secondary | ICD-10-CM | POA: Diagnosis not present

## 2018-08-17 DIAGNOSIS — N2581 Secondary hyperparathyroidism of renal origin: Secondary | ICD-10-CM | POA: Diagnosis not present

## 2018-08-17 DIAGNOSIS — D509 Iron deficiency anemia, unspecified: Secondary | ICD-10-CM | POA: Diagnosis not present

## 2018-08-17 DIAGNOSIS — D631 Anemia in chronic kidney disease: Secondary | ICD-10-CM | POA: Diagnosis not present

## 2018-08-17 DIAGNOSIS — N2589 Other disorders resulting from impaired renal tubular function: Secondary | ICD-10-CM | POA: Diagnosis not present

## 2018-08-17 DIAGNOSIS — Z4932 Encounter for adequacy testing for peritoneal dialysis: Secondary | ICD-10-CM | POA: Diagnosis not present

## 2018-08-17 DIAGNOSIS — N186 End stage renal disease: Secondary | ICD-10-CM | POA: Diagnosis not present

## 2018-08-18 DIAGNOSIS — D509 Iron deficiency anemia, unspecified: Secondary | ICD-10-CM | POA: Diagnosis not present

## 2018-08-18 DIAGNOSIS — D631 Anemia in chronic kidney disease: Secondary | ICD-10-CM | POA: Diagnosis not present

## 2018-08-18 DIAGNOSIS — Z4932 Encounter for adequacy testing for peritoneal dialysis: Secondary | ICD-10-CM | POA: Diagnosis not present

## 2018-08-18 DIAGNOSIS — N2589 Other disorders resulting from impaired renal tubular function: Secondary | ICD-10-CM | POA: Diagnosis not present

## 2018-08-18 DIAGNOSIS — N2581 Secondary hyperparathyroidism of renal origin: Secondary | ICD-10-CM | POA: Diagnosis not present

## 2018-08-18 DIAGNOSIS — N186 End stage renal disease: Secondary | ICD-10-CM | POA: Diagnosis not present

## 2018-08-19 DIAGNOSIS — N2589 Other disorders resulting from impaired renal tubular function: Secondary | ICD-10-CM | POA: Diagnosis not present

## 2018-08-19 DIAGNOSIS — D631 Anemia in chronic kidney disease: Secondary | ICD-10-CM | POA: Diagnosis not present

## 2018-08-19 DIAGNOSIS — N2581 Secondary hyperparathyroidism of renal origin: Secondary | ICD-10-CM | POA: Diagnosis not present

## 2018-08-19 DIAGNOSIS — D509 Iron deficiency anemia, unspecified: Secondary | ICD-10-CM | POA: Diagnosis not present

## 2018-08-19 DIAGNOSIS — N186 End stage renal disease: Secondary | ICD-10-CM | POA: Diagnosis not present

## 2018-08-19 DIAGNOSIS — Z4932 Encounter for adequacy testing for peritoneal dialysis: Secondary | ICD-10-CM | POA: Diagnosis not present

## 2018-08-20 DIAGNOSIS — N2589 Other disorders resulting from impaired renal tubular function: Secondary | ICD-10-CM | POA: Diagnosis not present

## 2018-08-20 DIAGNOSIS — D631 Anemia in chronic kidney disease: Secondary | ICD-10-CM | POA: Diagnosis not present

## 2018-08-20 DIAGNOSIS — Z4932 Encounter for adequacy testing for peritoneal dialysis: Secondary | ICD-10-CM | POA: Diagnosis not present

## 2018-08-20 DIAGNOSIS — D509 Iron deficiency anemia, unspecified: Secondary | ICD-10-CM | POA: Diagnosis not present

## 2018-08-20 DIAGNOSIS — N186 End stage renal disease: Secondary | ICD-10-CM | POA: Diagnosis not present

## 2018-08-20 DIAGNOSIS — N2581 Secondary hyperparathyroidism of renal origin: Secondary | ICD-10-CM | POA: Diagnosis not present

## 2018-08-21 DIAGNOSIS — D509 Iron deficiency anemia, unspecified: Secondary | ICD-10-CM | POA: Diagnosis not present

## 2018-08-21 DIAGNOSIS — D631 Anemia in chronic kidney disease: Secondary | ICD-10-CM | POA: Diagnosis not present

## 2018-08-21 DIAGNOSIS — Z4932 Encounter for adequacy testing for peritoneal dialysis: Secondary | ICD-10-CM | POA: Diagnosis not present

## 2018-08-21 DIAGNOSIS — N2589 Other disorders resulting from impaired renal tubular function: Secondary | ICD-10-CM | POA: Diagnosis not present

## 2018-08-21 DIAGNOSIS — N2581 Secondary hyperparathyroidism of renal origin: Secondary | ICD-10-CM | POA: Diagnosis not present

## 2018-08-21 DIAGNOSIS — N186 End stage renal disease: Secondary | ICD-10-CM | POA: Diagnosis not present

## 2018-08-22 DIAGNOSIS — N2589 Other disorders resulting from impaired renal tubular function: Secondary | ICD-10-CM | POA: Diagnosis not present

## 2018-08-22 DIAGNOSIS — D509 Iron deficiency anemia, unspecified: Secondary | ICD-10-CM | POA: Diagnosis not present

## 2018-08-22 DIAGNOSIS — D631 Anemia in chronic kidney disease: Secondary | ICD-10-CM | POA: Diagnosis not present

## 2018-08-22 DIAGNOSIS — N2581 Secondary hyperparathyroidism of renal origin: Secondary | ICD-10-CM | POA: Diagnosis not present

## 2018-08-22 DIAGNOSIS — Z4932 Encounter for adequacy testing for peritoneal dialysis: Secondary | ICD-10-CM | POA: Diagnosis not present

## 2018-08-22 DIAGNOSIS — N186 End stage renal disease: Secondary | ICD-10-CM | POA: Diagnosis not present

## 2018-08-23 DIAGNOSIS — N2589 Other disorders resulting from impaired renal tubular function: Secondary | ICD-10-CM | POA: Diagnosis not present

## 2018-08-23 DIAGNOSIS — N2581 Secondary hyperparathyroidism of renal origin: Secondary | ICD-10-CM | POA: Diagnosis not present

## 2018-08-23 DIAGNOSIS — Z4932 Encounter for adequacy testing for peritoneal dialysis: Secondary | ICD-10-CM | POA: Diagnosis not present

## 2018-08-23 DIAGNOSIS — N186 End stage renal disease: Secondary | ICD-10-CM | POA: Diagnosis not present

## 2018-08-23 DIAGNOSIS — D631 Anemia in chronic kidney disease: Secondary | ICD-10-CM | POA: Diagnosis not present

## 2018-08-23 DIAGNOSIS — D509 Iron deficiency anemia, unspecified: Secondary | ICD-10-CM | POA: Diagnosis not present

## 2018-08-24 DIAGNOSIS — D509 Iron deficiency anemia, unspecified: Secondary | ICD-10-CM | POA: Diagnosis not present

## 2018-08-24 DIAGNOSIS — D631 Anemia in chronic kidney disease: Secondary | ICD-10-CM | POA: Diagnosis not present

## 2018-08-24 DIAGNOSIS — N186 End stage renal disease: Secondary | ICD-10-CM | POA: Diagnosis not present

## 2018-08-24 DIAGNOSIS — N2581 Secondary hyperparathyroidism of renal origin: Secondary | ICD-10-CM | POA: Diagnosis not present

## 2018-08-24 DIAGNOSIS — N2589 Other disorders resulting from impaired renal tubular function: Secondary | ICD-10-CM | POA: Diagnosis not present

## 2018-08-24 DIAGNOSIS — Z4932 Encounter for adequacy testing for peritoneal dialysis: Secondary | ICD-10-CM | POA: Diagnosis not present

## 2018-08-25 DIAGNOSIS — N2589 Other disorders resulting from impaired renal tubular function: Secondary | ICD-10-CM | POA: Diagnosis not present

## 2018-08-25 DIAGNOSIS — N2581 Secondary hyperparathyroidism of renal origin: Secondary | ICD-10-CM | POA: Diagnosis not present

## 2018-08-25 DIAGNOSIS — N186 End stage renal disease: Secondary | ICD-10-CM | POA: Diagnosis not present

## 2018-08-25 DIAGNOSIS — D509 Iron deficiency anemia, unspecified: Secondary | ICD-10-CM | POA: Diagnosis not present

## 2018-08-25 DIAGNOSIS — D631 Anemia in chronic kidney disease: Secondary | ICD-10-CM | POA: Diagnosis not present

## 2018-08-25 DIAGNOSIS — Z4932 Encounter for adequacy testing for peritoneal dialysis: Secondary | ICD-10-CM | POA: Diagnosis not present

## 2018-08-26 DIAGNOSIS — Z4932 Encounter for adequacy testing for peritoneal dialysis: Secondary | ICD-10-CM | POA: Diagnosis not present

## 2018-08-26 DIAGNOSIS — N186 End stage renal disease: Secondary | ICD-10-CM | POA: Diagnosis not present

## 2018-08-26 DIAGNOSIS — D509 Iron deficiency anemia, unspecified: Secondary | ICD-10-CM | POA: Diagnosis not present

## 2018-08-26 DIAGNOSIS — N2581 Secondary hyperparathyroidism of renal origin: Secondary | ICD-10-CM | POA: Diagnosis not present

## 2018-08-26 DIAGNOSIS — N2589 Other disorders resulting from impaired renal tubular function: Secondary | ICD-10-CM | POA: Diagnosis not present

## 2018-08-26 DIAGNOSIS — D631 Anemia in chronic kidney disease: Secondary | ICD-10-CM | POA: Diagnosis not present

## 2018-08-27 DIAGNOSIS — D509 Iron deficiency anemia, unspecified: Secondary | ICD-10-CM | POA: Diagnosis not present

## 2018-08-27 DIAGNOSIS — D631 Anemia in chronic kidney disease: Secondary | ICD-10-CM | POA: Diagnosis not present

## 2018-08-27 DIAGNOSIS — N2589 Other disorders resulting from impaired renal tubular function: Secondary | ICD-10-CM | POA: Diagnosis not present

## 2018-08-27 DIAGNOSIS — N2581 Secondary hyperparathyroidism of renal origin: Secondary | ICD-10-CM | POA: Diagnosis not present

## 2018-08-27 DIAGNOSIS — N186 End stage renal disease: Secondary | ICD-10-CM | POA: Diagnosis not present

## 2018-08-27 DIAGNOSIS — Z4932 Encounter for adequacy testing for peritoneal dialysis: Secondary | ICD-10-CM | POA: Diagnosis not present

## 2018-08-28 DIAGNOSIS — Z4932 Encounter for adequacy testing for peritoneal dialysis: Secondary | ICD-10-CM | POA: Diagnosis not present

## 2018-08-28 DIAGNOSIS — N186 End stage renal disease: Secondary | ICD-10-CM | POA: Diagnosis not present

## 2018-08-28 DIAGNOSIS — N2581 Secondary hyperparathyroidism of renal origin: Secondary | ICD-10-CM | POA: Diagnosis not present

## 2018-08-28 DIAGNOSIS — N2589 Other disorders resulting from impaired renal tubular function: Secondary | ICD-10-CM | POA: Diagnosis not present

## 2018-08-28 DIAGNOSIS — D509 Iron deficiency anemia, unspecified: Secondary | ICD-10-CM | POA: Diagnosis not present

## 2018-08-28 DIAGNOSIS — D631 Anemia in chronic kidney disease: Secondary | ICD-10-CM | POA: Diagnosis not present

## 2018-08-29 DIAGNOSIS — N2581 Secondary hyperparathyroidism of renal origin: Secondary | ICD-10-CM | POA: Diagnosis not present

## 2018-08-29 DIAGNOSIS — N2589 Other disorders resulting from impaired renal tubular function: Secondary | ICD-10-CM | POA: Diagnosis not present

## 2018-08-29 DIAGNOSIS — D631 Anemia in chronic kidney disease: Secondary | ICD-10-CM | POA: Diagnosis not present

## 2018-08-29 DIAGNOSIS — Z4932 Encounter for adequacy testing for peritoneal dialysis: Secondary | ICD-10-CM | POA: Diagnosis not present

## 2018-08-29 DIAGNOSIS — N186 End stage renal disease: Secondary | ICD-10-CM | POA: Diagnosis not present

## 2018-08-29 DIAGNOSIS — D509 Iron deficiency anemia, unspecified: Secondary | ICD-10-CM | POA: Diagnosis not present

## 2018-08-30 DIAGNOSIS — Z4932 Encounter for adequacy testing for peritoneal dialysis: Secondary | ICD-10-CM | POA: Diagnosis not present

## 2018-08-30 DIAGNOSIS — N2581 Secondary hyperparathyroidism of renal origin: Secondary | ICD-10-CM | POA: Diagnosis not present

## 2018-08-30 DIAGNOSIS — D509 Iron deficiency anemia, unspecified: Secondary | ICD-10-CM | POA: Diagnosis not present

## 2018-08-30 DIAGNOSIS — N2589 Other disorders resulting from impaired renal tubular function: Secondary | ICD-10-CM | POA: Diagnosis not present

## 2018-08-30 DIAGNOSIS — N186 End stage renal disease: Secondary | ICD-10-CM | POA: Diagnosis not present

## 2018-08-30 DIAGNOSIS — D631 Anemia in chronic kidney disease: Secondary | ICD-10-CM | POA: Diagnosis not present

## 2018-08-31 DIAGNOSIS — N2589 Other disorders resulting from impaired renal tubular function: Secondary | ICD-10-CM | POA: Diagnosis not present

## 2018-08-31 DIAGNOSIS — D509 Iron deficiency anemia, unspecified: Secondary | ICD-10-CM | POA: Diagnosis not present

## 2018-08-31 DIAGNOSIS — Z4932 Encounter for adequacy testing for peritoneal dialysis: Secondary | ICD-10-CM | POA: Diagnosis not present

## 2018-08-31 DIAGNOSIS — N186 End stage renal disease: Secondary | ICD-10-CM | POA: Diagnosis not present

## 2018-08-31 DIAGNOSIS — D631 Anemia in chronic kidney disease: Secondary | ICD-10-CM | POA: Diagnosis not present

## 2018-08-31 DIAGNOSIS — N2581 Secondary hyperparathyroidism of renal origin: Secondary | ICD-10-CM | POA: Diagnosis not present

## 2018-09-01 DIAGNOSIS — N186 End stage renal disease: Secondary | ICD-10-CM | POA: Diagnosis not present

## 2018-09-01 DIAGNOSIS — Z4932 Encounter for adequacy testing for peritoneal dialysis: Secondary | ICD-10-CM | POA: Diagnosis not present

## 2018-09-01 DIAGNOSIS — N2589 Other disorders resulting from impaired renal tubular function: Secondary | ICD-10-CM | POA: Diagnosis not present

## 2018-09-01 DIAGNOSIS — D509 Iron deficiency anemia, unspecified: Secondary | ICD-10-CM | POA: Diagnosis not present

## 2018-09-01 DIAGNOSIS — D631 Anemia in chronic kidney disease: Secondary | ICD-10-CM | POA: Diagnosis not present

## 2018-09-01 DIAGNOSIS — N2581 Secondary hyperparathyroidism of renal origin: Secondary | ICD-10-CM | POA: Diagnosis not present

## 2018-09-02 DIAGNOSIS — N2589 Other disorders resulting from impaired renal tubular function: Secondary | ICD-10-CM | POA: Diagnosis not present

## 2018-09-02 DIAGNOSIS — N186 End stage renal disease: Secondary | ICD-10-CM | POA: Diagnosis not present

## 2018-09-02 DIAGNOSIS — D631 Anemia in chronic kidney disease: Secondary | ICD-10-CM | POA: Diagnosis not present

## 2018-09-02 DIAGNOSIS — D509 Iron deficiency anemia, unspecified: Secondary | ICD-10-CM | POA: Diagnosis not present

## 2018-09-02 DIAGNOSIS — N2581 Secondary hyperparathyroidism of renal origin: Secondary | ICD-10-CM | POA: Diagnosis not present

## 2018-09-02 DIAGNOSIS — Z4932 Encounter for adequacy testing for peritoneal dialysis: Secondary | ICD-10-CM | POA: Diagnosis not present

## 2018-09-03 DIAGNOSIS — N2589 Other disorders resulting from impaired renal tubular function: Secondary | ICD-10-CM | POA: Diagnosis not present

## 2018-09-03 DIAGNOSIS — N186 End stage renal disease: Secondary | ICD-10-CM | POA: Diagnosis not present

## 2018-09-03 DIAGNOSIS — N2581 Secondary hyperparathyroidism of renal origin: Secondary | ICD-10-CM | POA: Diagnosis not present

## 2018-09-03 DIAGNOSIS — D509 Iron deficiency anemia, unspecified: Secondary | ICD-10-CM | POA: Diagnosis not present

## 2018-09-03 DIAGNOSIS — D631 Anemia in chronic kidney disease: Secondary | ICD-10-CM | POA: Diagnosis not present

## 2018-09-03 DIAGNOSIS — Z4932 Encounter for adequacy testing for peritoneal dialysis: Secondary | ICD-10-CM | POA: Diagnosis not present

## 2018-09-04 DIAGNOSIS — D631 Anemia in chronic kidney disease: Secondary | ICD-10-CM | POA: Diagnosis not present

## 2018-09-04 DIAGNOSIS — K769 Liver disease, unspecified: Secondary | ICD-10-CM | POA: Diagnosis not present

## 2018-09-04 DIAGNOSIS — Z4932 Encounter for adequacy testing for peritoneal dialysis: Secondary | ICD-10-CM | POA: Diagnosis not present

## 2018-09-04 DIAGNOSIS — N186 End stage renal disease: Secondary | ICD-10-CM | POA: Diagnosis not present

## 2018-09-04 DIAGNOSIS — R17 Unspecified jaundice: Secondary | ICD-10-CM | POA: Diagnosis not present

## 2018-09-04 DIAGNOSIS — E440 Moderate protein-calorie malnutrition: Secondary | ICD-10-CM | POA: Diagnosis not present

## 2018-09-04 DIAGNOSIS — Z79899 Other long term (current) drug therapy: Secondary | ICD-10-CM | POA: Diagnosis not present

## 2018-09-04 DIAGNOSIS — Z992 Dependence on renal dialysis: Secondary | ICD-10-CM | POA: Diagnosis not present

## 2018-09-04 DIAGNOSIS — I129 Hypertensive chronic kidney disease with stage 1 through stage 4 chronic kidney disease, or unspecified chronic kidney disease: Secondary | ICD-10-CM | POA: Diagnosis not present

## 2018-09-04 DIAGNOSIS — N2581 Secondary hyperparathyroidism of renal origin: Secondary | ICD-10-CM | POA: Diagnosis not present

## 2018-09-04 DIAGNOSIS — D509 Iron deficiency anemia, unspecified: Secondary | ICD-10-CM | POA: Diagnosis not present

## 2018-09-05 DIAGNOSIS — N186 End stage renal disease: Secondary | ICD-10-CM | POA: Diagnosis not present

## 2018-09-05 DIAGNOSIS — D631 Anemia in chronic kidney disease: Secondary | ICD-10-CM | POA: Diagnosis not present

## 2018-09-05 DIAGNOSIS — K769 Liver disease, unspecified: Secondary | ICD-10-CM | POA: Diagnosis not present

## 2018-09-05 DIAGNOSIS — R17 Unspecified jaundice: Secondary | ICD-10-CM | POA: Diagnosis not present

## 2018-09-05 DIAGNOSIS — Z4932 Encounter for adequacy testing for peritoneal dialysis: Secondary | ICD-10-CM | POA: Diagnosis not present

## 2018-09-05 DIAGNOSIS — N2581 Secondary hyperparathyroidism of renal origin: Secondary | ICD-10-CM | POA: Diagnosis not present

## 2018-09-06 DIAGNOSIS — N2581 Secondary hyperparathyroidism of renal origin: Secondary | ICD-10-CM | POA: Diagnosis not present

## 2018-09-06 DIAGNOSIS — Z4932 Encounter for adequacy testing for peritoneal dialysis: Secondary | ICD-10-CM | POA: Diagnosis not present

## 2018-09-06 DIAGNOSIS — N186 End stage renal disease: Secondary | ICD-10-CM | POA: Diagnosis not present

## 2018-09-06 DIAGNOSIS — D631 Anemia in chronic kidney disease: Secondary | ICD-10-CM | POA: Diagnosis not present

## 2018-09-06 DIAGNOSIS — R17 Unspecified jaundice: Secondary | ICD-10-CM | POA: Diagnosis not present

## 2018-09-06 DIAGNOSIS — K769 Liver disease, unspecified: Secondary | ICD-10-CM | POA: Diagnosis not present

## 2018-09-07 DIAGNOSIS — K769 Liver disease, unspecified: Secondary | ICD-10-CM | POA: Diagnosis not present

## 2018-09-07 DIAGNOSIS — N2581 Secondary hyperparathyroidism of renal origin: Secondary | ICD-10-CM | POA: Diagnosis not present

## 2018-09-07 DIAGNOSIS — N186 End stage renal disease: Secondary | ICD-10-CM | POA: Diagnosis not present

## 2018-09-07 DIAGNOSIS — R17 Unspecified jaundice: Secondary | ICD-10-CM | POA: Diagnosis not present

## 2018-09-07 DIAGNOSIS — Z4932 Encounter for adequacy testing for peritoneal dialysis: Secondary | ICD-10-CM | POA: Diagnosis not present

## 2018-09-07 DIAGNOSIS — D631 Anemia in chronic kidney disease: Secondary | ICD-10-CM | POA: Diagnosis not present

## 2018-09-08 DIAGNOSIS — N186 End stage renal disease: Secondary | ICD-10-CM | POA: Diagnosis not present

## 2018-09-08 DIAGNOSIS — D631 Anemia in chronic kidney disease: Secondary | ICD-10-CM | POA: Diagnosis not present

## 2018-09-08 DIAGNOSIS — N2581 Secondary hyperparathyroidism of renal origin: Secondary | ICD-10-CM | POA: Diagnosis not present

## 2018-09-08 DIAGNOSIS — K769 Liver disease, unspecified: Secondary | ICD-10-CM | POA: Diagnosis not present

## 2018-09-08 DIAGNOSIS — Z4932 Encounter for adequacy testing for peritoneal dialysis: Secondary | ICD-10-CM | POA: Diagnosis not present

## 2018-09-08 DIAGNOSIS — R17 Unspecified jaundice: Secondary | ICD-10-CM | POA: Diagnosis not present

## 2018-09-09 DIAGNOSIS — K769 Liver disease, unspecified: Secondary | ICD-10-CM | POA: Diagnosis not present

## 2018-09-09 DIAGNOSIS — R17 Unspecified jaundice: Secondary | ICD-10-CM | POA: Diagnosis not present

## 2018-09-09 DIAGNOSIS — D631 Anemia in chronic kidney disease: Secondary | ICD-10-CM | POA: Diagnosis not present

## 2018-09-09 DIAGNOSIS — N2581 Secondary hyperparathyroidism of renal origin: Secondary | ICD-10-CM | POA: Diagnosis not present

## 2018-09-09 DIAGNOSIS — Z4932 Encounter for adequacy testing for peritoneal dialysis: Secondary | ICD-10-CM | POA: Diagnosis not present

## 2018-09-09 DIAGNOSIS — N186 End stage renal disease: Secondary | ICD-10-CM | POA: Diagnosis not present

## 2018-09-09 DIAGNOSIS — R82998 Other abnormal findings in urine: Secondary | ICD-10-CM | POA: Diagnosis not present

## 2018-09-10 DIAGNOSIS — Z4932 Encounter for adequacy testing for peritoneal dialysis: Secondary | ICD-10-CM | POA: Diagnosis not present

## 2018-09-10 DIAGNOSIS — D631 Anemia in chronic kidney disease: Secondary | ICD-10-CM | POA: Diagnosis not present

## 2018-09-10 DIAGNOSIS — K769 Liver disease, unspecified: Secondary | ICD-10-CM | POA: Diagnosis not present

## 2018-09-10 DIAGNOSIS — R17 Unspecified jaundice: Secondary | ICD-10-CM | POA: Diagnosis not present

## 2018-09-10 DIAGNOSIS — N2581 Secondary hyperparathyroidism of renal origin: Secondary | ICD-10-CM | POA: Diagnosis not present

## 2018-09-10 DIAGNOSIS — N186 End stage renal disease: Secondary | ICD-10-CM | POA: Diagnosis not present

## 2018-09-11 DIAGNOSIS — N186 End stage renal disease: Secondary | ICD-10-CM | POA: Diagnosis not present

## 2018-09-11 DIAGNOSIS — K769 Liver disease, unspecified: Secondary | ICD-10-CM | POA: Diagnosis not present

## 2018-09-11 DIAGNOSIS — N2581 Secondary hyperparathyroidism of renal origin: Secondary | ICD-10-CM | POA: Diagnosis not present

## 2018-09-11 DIAGNOSIS — R17 Unspecified jaundice: Secondary | ICD-10-CM | POA: Diagnosis not present

## 2018-09-11 DIAGNOSIS — Z4932 Encounter for adequacy testing for peritoneal dialysis: Secondary | ICD-10-CM | POA: Diagnosis not present

## 2018-09-11 DIAGNOSIS — D631 Anemia in chronic kidney disease: Secondary | ICD-10-CM | POA: Diagnosis not present

## 2018-09-12 DIAGNOSIS — R17 Unspecified jaundice: Secondary | ICD-10-CM | POA: Diagnosis not present

## 2018-09-12 DIAGNOSIS — K769 Liver disease, unspecified: Secondary | ICD-10-CM | POA: Diagnosis not present

## 2018-09-12 DIAGNOSIS — N186 End stage renal disease: Secondary | ICD-10-CM | POA: Diagnosis not present

## 2018-09-12 DIAGNOSIS — N2581 Secondary hyperparathyroidism of renal origin: Secondary | ICD-10-CM | POA: Diagnosis not present

## 2018-09-12 DIAGNOSIS — D631 Anemia in chronic kidney disease: Secondary | ICD-10-CM | POA: Diagnosis not present

## 2018-09-12 DIAGNOSIS — Z4932 Encounter for adequacy testing for peritoneal dialysis: Secondary | ICD-10-CM | POA: Diagnosis not present

## 2018-09-13 DIAGNOSIS — R17 Unspecified jaundice: Secondary | ICD-10-CM | POA: Diagnosis not present

## 2018-09-13 DIAGNOSIS — N186 End stage renal disease: Secondary | ICD-10-CM | POA: Diagnosis not present

## 2018-09-13 DIAGNOSIS — K769 Liver disease, unspecified: Secondary | ICD-10-CM | POA: Diagnosis not present

## 2018-09-13 DIAGNOSIS — Z4932 Encounter for adequacy testing for peritoneal dialysis: Secondary | ICD-10-CM | POA: Diagnosis not present

## 2018-09-13 DIAGNOSIS — N2581 Secondary hyperparathyroidism of renal origin: Secondary | ICD-10-CM | POA: Diagnosis not present

## 2018-09-13 DIAGNOSIS — D631 Anemia in chronic kidney disease: Secondary | ICD-10-CM | POA: Diagnosis not present

## 2018-09-14 DIAGNOSIS — N186 End stage renal disease: Secondary | ICD-10-CM | POA: Diagnosis not present

## 2018-09-14 DIAGNOSIS — Z4932 Encounter for adequacy testing for peritoneal dialysis: Secondary | ICD-10-CM | POA: Diagnosis not present

## 2018-09-14 DIAGNOSIS — D631 Anemia in chronic kidney disease: Secondary | ICD-10-CM | POA: Diagnosis not present

## 2018-09-14 DIAGNOSIS — R17 Unspecified jaundice: Secondary | ICD-10-CM | POA: Diagnosis not present

## 2018-09-14 DIAGNOSIS — K769 Liver disease, unspecified: Secondary | ICD-10-CM | POA: Diagnosis not present

## 2018-09-14 DIAGNOSIS — N2581 Secondary hyperparathyroidism of renal origin: Secondary | ICD-10-CM | POA: Diagnosis not present

## 2018-09-15 DIAGNOSIS — R17 Unspecified jaundice: Secondary | ICD-10-CM | POA: Diagnosis not present

## 2018-09-15 DIAGNOSIS — Z4932 Encounter for adequacy testing for peritoneal dialysis: Secondary | ICD-10-CM | POA: Diagnosis not present

## 2018-09-15 DIAGNOSIS — N186 End stage renal disease: Secondary | ICD-10-CM | POA: Diagnosis not present

## 2018-09-15 DIAGNOSIS — D631 Anemia in chronic kidney disease: Secondary | ICD-10-CM | POA: Diagnosis not present

## 2018-09-15 DIAGNOSIS — K769 Liver disease, unspecified: Secondary | ICD-10-CM | POA: Diagnosis not present

## 2018-09-15 DIAGNOSIS — N2581 Secondary hyperparathyroidism of renal origin: Secondary | ICD-10-CM | POA: Diagnosis not present

## 2018-09-16 DIAGNOSIS — K769 Liver disease, unspecified: Secondary | ICD-10-CM | POA: Diagnosis not present

## 2018-09-16 DIAGNOSIS — R17 Unspecified jaundice: Secondary | ICD-10-CM | POA: Diagnosis not present

## 2018-09-16 DIAGNOSIS — N186 End stage renal disease: Secondary | ICD-10-CM | POA: Diagnosis not present

## 2018-09-16 DIAGNOSIS — D631 Anemia in chronic kidney disease: Secondary | ICD-10-CM | POA: Diagnosis not present

## 2018-09-16 DIAGNOSIS — N2581 Secondary hyperparathyroidism of renal origin: Secondary | ICD-10-CM | POA: Diagnosis not present

## 2018-09-16 DIAGNOSIS — Z4932 Encounter for adequacy testing for peritoneal dialysis: Secondary | ICD-10-CM | POA: Diagnosis not present

## 2018-09-17 DIAGNOSIS — N186 End stage renal disease: Secondary | ICD-10-CM | POA: Diagnosis not present

## 2018-09-17 DIAGNOSIS — N2581 Secondary hyperparathyroidism of renal origin: Secondary | ICD-10-CM | POA: Diagnosis not present

## 2018-09-17 DIAGNOSIS — Z4932 Encounter for adequacy testing for peritoneal dialysis: Secondary | ICD-10-CM | POA: Diagnosis not present

## 2018-09-17 DIAGNOSIS — R17 Unspecified jaundice: Secondary | ICD-10-CM | POA: Diagnosis not present

## 2018-09-17 DIAGNOSIS — K769 Liver disease, unspecified: Secondary | ICD-10-CM | POA: Diagnosis not present

## 2018-09-17 DIAGNOSIS — D631 Anemia in chronic kidney disease: Secondary | ICD-10-CM | POA: Diagnosis not present

## 2018-09-18 DIAGNOSIS — N2581 Secondary hyperparathyroidism of renal origin: Secondary | ICD-10-CM | POA: Diagnosis not present

## 2018-09-18 DIAGNOSIS — Z4932 Encounter for adequacy testing for peritoneal dialysis: Secondary | ICD-10-CM | POA: Diagnosis not present

## 2018-09-18 DIAGNOSIS — D631 Anemia in chronic kidney disease: Secondary | ICD-10-CM | POA: Diagnosis not present

## 2018-09-18 DIAGNOSIS — N186 End stage renal disease: Secondary | ICD-10-CM | POA: Diagnosis not present

## 2018-09-18 DIAGNOSIS — K769 Liver disease, unspecified: Secondary | ICD-10-CM | POA: Diagnosis not present

## 2018-09-18 DIAGNOSIS — R17 Unspecified jaundice: Secondary | ICD-10-CM | POA: Diagnosis not present

## 2018-09-19 DIAGNOSIS — R17 Unspecified jaundice: Secondary | ICD-10-CM | POA: Diagnosis not present

## 2018-09-19 DIAGNOSIS — N186 End stage renal disease: Secondary | ICD-10-CM | POA: Diagnosis not present

## 2018-09-19 DIAGNOSIS — K769 Liver disease, unspecified: Secondary | ICD-10-CM | POA: Diagnosis not present

## 2018-09-19 DIAGNOSIS — D631 Anemia in chronic kidney disease: Secondary | ICD-10-CM | POA: Diagnosis not present

## 2018-09-19 DIAGNOSIS — Z4932 Encounter for adequacy testing for peritoneal dialysis: Secondary | ICD-10-CM | POA: Diagnosis not present

## 2018-09-19 DIAGNOSIS — N2581 Secondary hyperparathyroidism of renal origin: Secondary | ICD-10-CM | POA: Diagnosis not present

## 2018-09-20 DIAGNOSIS — K769 Liver disease, unspecified: Secondary | ICD-10-CM | POA: Diagnosis not present

## 2018-09-20 DIAGNOSIS — N2581 Secondary hyperparathyroidism of renal origin: Secondary | ICD-10-CM | POA: Diagnosis not present

## 2018-09-20 DIAGNOSIS — D631 Anemia in chronic kidney disease: Secondary | ICD-10-CM | POA: Diagnosis not present

## 2018-09-20 DIAGNOSIS — N186 End stage renal disease: Secondary | ICD-10-CM | POA: Diagnosis not present

## 2018-09-20 DIAGNOSIS — R17 Unspecified jaundice: Secondary | ICD-10-CM | POA: Diagnosis not present

## 2018-09-20 DIAGNOSIS — Z4932 Encounter for adequacy testing for peritoneal dialysis: Secondary | ICD-10-CM | POA: Diagnosis not present

## 2018-09-21 DIAGNOSIS — K769 Liver disease, unspecified: Secondary | ICD-10-CM | POA: Diagnosis not present

## 2018-09-21 DIAGNOSIS — D631 Anemia in chronic kidney disease: Secondary | ICD-10-CM | POA: Diagnosis not present

## 2018-09-21 DIAGNOSIS — R17 Unspecified jaundice: Secondary | ICD-10-CM | POA: Diagnosis not present

## 2018-09-21 DIAGNOSIS — N2581 Secondary hyperparathyroidism of renal origin: Secondary | ICD-10-CM | POA: Diagnosis not present

## 2018-09-21 DIAGNOSIS — N186 End stage renal disease: Secondary | ICD-10-CM | POA: Diagnosis not present

## 2018-09-21 DIAGNOSIS — Z4932 Encounter for adequacy testing for peritoneal dialysis: Secondary | ICD-10-CM | POA: Diagnosis not present

## 2018-09-22 DIAGNOSIS — D631 Anemia in chronic kidney disease: Secondary | ICD-10-CM | POA: Diagnosis not present

## 2018-09-22 DIAGNOSIS — Z4932 Encounter for adequacy testing for peritoneal dialysis: Secondary | ICD-10-CM | POA: Diagnosis not present

## 2018-09-22 DIAGNOSIS — R17 Unspecified jaundice: Secondary | ICD-10-CM | POA: Diagnosis not present

## 2018-09-22 DIAGNOSIS — N2581 Secondary hyperparathyroidism of renal origin: Secondary | ICD-10-CM | POA: Diagnosis not present

## 2018-09-22 DIAGNOSIS — K769 Liver disease, unspecified: Secondary | ICD-10-CM | POA: Diagnosis not present

## 2018-09-22 DIAGNOSIS — N186 End stage renal disease: Secondary | ICD-10-CM | POA: Diagnosis not present

## 2018-09-23 DIAGNOSIS — K769 Liver disease, unspecified: Secondary | ICD-10-CM | POA: Diagnosis not present

## 2018-09-23 DIAGNOSIS — N186 End stage renal disease: Secondary | ICD-10-CM | POA: Diagnosis not present

## 2018-09-23 DIAGNOSIS — Z4932 Encounter for adequacy testing for peritoneal dialysis: Secondary | ICD-10-CM | POA: Diagnosis not present

## 2018-09-23 DIAGNOSIS — N2581 Secondary hyperparathyroidism of renal origin: Secondary | ICD-10-CM | POA: Diagnosis not present

## 2018-09-23 DIAGNOSIS — D631 Anemia in chronic kidney disease: Secondary | ICD-10-CM | POA: Diagnosis not present

## 2018-09-23 DIAGNOSIS — R17 Unspecified jaundice: Secondary | ICD-10-CM | POA: Diagnosis not present

## 2018-09-24 DIAGNOSIS — N186 End stage renal disease: Secondary | ICD-10-CM | POA: Diagnosis not present

## 2018-09-24 DIAGNOSIS — R17 Unspecified jaundice: Secondary | ICD-10-CM | POA: Diagnosis not present

## 2018-09-24 DIAGNOSIS — D631 Anemia in chronic kidney disease: Secondary | ICD-10-CM | POA: Diagnosis not present

## 2018-09-24 DIAGNOSIS — N2581 Secondary hyperparathyroidism of renal origin: Secondary | ICD-10-CM | POA: Diagnosis not present

## 2018-09-24 DIAGNOSIS — Z4932 Encounter for adequacy testing for peritoneal dialysis: Secondary | ICD-10-CM | POA: Diagnosis not present

## 2018-09-24 DIAGNOSIS — K769 Liver disease, unspecified: Secondary | ICD-10-CM | POA: Diagnosis not present

## 2018-09-25 DIAGNOSIS — D631 Anemia in chronic kidney disease: Secondary | ICD-10-CM | POA: Diagnosis not present

## 2018-09-25 DIAGNOSIS — N2581 Secondary hyperparathyroidism of renal origin: Secondary | ICD-10-CM | POA: Diagnosis not present

## 2018-09-25 DIAGNOSIS — R17 Unspecified jaundice: Secondary | ICD-10-CM | POA: Diagnosis not present

## 2018-09-25 DIAGNOSIS — Z4932 Encounter for adequacy testing for peritoneal dialysis: Secondary | ICD-10-CM | POA: Diagnosis not present

## 2018-09-25 DIAGNOSIS — N186 End stage renal disease: Secondary | ICD-10-CM | POA: Diagnosis not present

## 2018-09-25 DIAGNOSIS — K769 Liver disease, unspecified: Secondary | ICD-10-CM | POA: Diagnosis not present

## 2018-09-26 DIAGNOSIS — N186 End stage renal disease: Secondary | ICD-10-CM | POA: Diagnosis not present

## 2018-09-26 DIAGNOSIS — Z4932 Encounter for adequacy testing for peritoneal dialysis: Secondary | ICD-10-CM | POA: Diagnosis not present

## 2018-09-26 DIAGNOSIS — D631 Anemia in chronic kidney disease: Secondary | ICD-10-CM | POA: Diagnosis not present

## 2018-09-26 DIAGNOSIS — N2581 Secondary hyperparathyroidism of renal origin: Secondary | ICD-10-CM | POA: Diagnosis not present

## 2018-09-26 DIAGNOSIS — R17 Unspecified jaundice: Secondary | ICD-10-CM | POA: Diagnosis not present

## 2018-09-26 DIAGNOSIS — K769 Liver disease, unspecified: Secondary | ICD-10-CM | POA: Diagnosis not present

## 2018-09-27 DIAGNOSIS — N186 End stage renal disease: Secondary | ICD-10-CM | POA: Diagnosis not present

## 2018-09-27 DIAGNOSIS — K769 Liver disease, unspecified: Secondary | ICD-10-CM | POA: Diagnosis not present

## 2018-09-27 DIAGNOSIS — R17 Unspecified jaundice: Secondary | ICD-10-CM | POA: Diagnosis not present

## 2018-09-27 DIAGNOSIS — N2581 Secondary hyperparathyroidism of renal origin: Secondary | ICD-10-CM | POA: Diagnosis not present

## 2018-09-27 DIAGNOSIS — D631 Anemia in chronic kidney disease: Secondary | ICD-10-CM | POA: Diagnosis not present

## 2018-09-27 DIAGNOSIS — Z4932 Encounter for adequacy testing for peritoneal dialysis: Secondary | ICD-10-CM | POA: Diagnosis not present

## 2018-09-28 DIAGNOSIS — N2581 Secondary hyperparathyroidism of renal origin: Secondary | ICD-10-CM | POA: Diagnosis not present

## 2018-09-28 DIAGNOSIS — R17 Unspecified jaundice: Secondary | ICD-10-CM | POA: Diagnosis not present

## 2018-09-28 DIAGNOSIS — D631 Anemia in chronic kidney disease: Secondary | ICD-10-CM | POA: Diagnosis not present

## 2018-09-28 DIAGNOSIS — K769 Liver disease, unspecified: Secondary | ICD-10-CM | POA: Diagnosis not present

## 2018-09-28 DIAGNOSIS — N186 End stage renal disease: Secondary | ICD-10-CM | POA: Diagnosis not present

## 2018-09-28 DIAGNOSIS — Z4932 Encounter for adequacy testing for peritoneal dialysis: Secondary | ICD-10-CM | POA: Diagnosis not present

## 2018-09-29 DIAGNOSIS — R17 Unspecified jaundice: Secondary | ICD-10-CM | POA: Diagnosis not present

## 2018-09-29 DIAGNOSIS — N2581 Secondary hyperparathyroidism of renal origin: Secondary | ICD-10-CM | POA: Diagnosis not present

## 2018-09-29 DIAGNOSIS — N186 End stage renal disease: Secondary | ICD-10-CM | POA: Diagnosis not present

## 2018-09-29 DIAGNOSIS — Z4932 Encounter for adequacy testing for peritoneal dialysis: Secondary | ICD-10-CM | POA: Diagnosis not present

## 2018-09-29 DIAGNOSIS — K769 Liver disease, unspecified: Secondary | ICD-10-CM | POA: Diagnosis not present

## 2018-09-29 DIAGNOSIS — D631 Anemia in chronic kidney disease: Secondary | ICD-10-CM | POA: Diagnosis not present

## 2018-09-30 DIAGNOSIS — N2581 Secondary hyperparathyroidism of renal origin: Secondary | ICD-10-CM | POA: Diagnosis not present

## 2018-09-30 DIAGNOSIS — D631 Anemia in chronic kidney disease: Secondary | ICD-10-CM | POA: Diagnosis not present

## 2018-09-30 DIAGNOSIS — R17 Unspecified jaundice: Secondary | ICD-10-CM | POA: Diagnosis not present

## 2018-09-30 DIAGNOSIS — K769 Liver disease, unspecified: Secondary | ICD-10-CM | POA: Diagnosis not present

## 2018-09-30 DIAGNOSIS — Z4932 Encounter for adequacy testing for peritoneal dialysis: Secondary | ICD-10-CM | POA: Diagnosis not present

## 2018-09-30 DIAGNOSIS — N186 End stage renal disease: Secondary | ICD-10-CM | POA: Diagnosis not present

## 2018-10-01 DIAGNOSIS — K769 Liver disease, unspecified: Secondary | ICD-10-CM | POA: Diagnosis not present

## 2018-10-01 DIAGNOSIS — N2581 Secondary hyperparathyroidism of renal origin: Secondary | ICD-10-CM | POA: Diagnosis not present

## 2018-10-01 DIAGNOSIS — R17 Unspecified jaundice: Secondary | ICD-10-CM | POA: Diagnosis not present

## 2018-10-01 DIAGNOSIS — Z4932 Encounter for adequacy testing for peritoneal dialysis: Secondary | ICD-10-CM | POA: Diagnosis not present

## 2018-10-01 DIAGNOSIS — D631 Anemia in chronic kidney disease: Secondary | ICD-10-CM | POA: Diagnosis not present

## 2018-10-01 DIAGNOSIS — N186 End stage renal disease: Secondary | ICD-10-CM | POA: Diagnosis not present

## 2018-10-02 DIAGNOSIS — K769 Liver disease, unspecified: Secondary | ICD-10-CM | POA: Diagnosis not present

## 2018-10-02 DIAGNOSIS — D631 Anemia in chronic kidney disease: Secondary | ICD-10-CM | POA: Diagnosis not present

## 2018-10-02 DIAGNOSIS — R17 Unspecified jaundice: Secondary | ICD-10-CM | POA: Diagnosis not present

## 2018-10-02 DIAGNOSIS — N186 End stage renal disease: Secondary | ICD-10-CM | POA: Diagnosis not present

## 2018-10-02 DIAGNOSIS — N2581 Secondary hyperparathyroidism of renal origin: Secondary | ICD-10-CM | POA: Diagnosis not present

## 2018-10-02 DIAGNOSIS — Z4932 Encounter for adequacy testing for peritoneal dialysis: Secondary | ICD-10-CM | POA: Diagnosis not present

## 2018-10-03 DIAGNOSIS — Z992 Dependence on renal dialysis: Secondary | ICD-10-CM | POA: Diagnosis not present

## 2018-10-03 DIAGNOSIS — D509 Iron deficiency anemia, unspecified: Secondary | ICD-10-CM | POA: Diagnosis not present

## 2018-10-03 DIAGNOSIS — E440 Moderate protein-calorie malnutrition: Secondary | ICD-10-CM | POA: Diagnosis not present

## 2018-10-03 DIAGNOSIS — I129 Hypertensive chronic kidney disease with stage 1 through stage 4 chronic kidney disease, or unspecified chronic kidney disease: Secondary | ICD-10-CM | POA: Diagnosis not present

## 2018-10-03 DIAGNOSIS — Z4932 Encounter for adequacy testing for peritoneal dialysis: Secondary | ICD-10-CM | POA: Diagnosis not present

## 2018-10-03 DIAGNOSIS — N2581 Secondary hyperparathyroidism of renal origin: Secondary | ICD-10-CM | POA: Diagnosis not present

## 2018-10-03 DIAGNOSIS — K769 Liver disease, unspecified: Secondary | ICD-10-CM | POA: Diagnosis not present

## 2018-10-03 DIAGNOSIS — N186 End stage renal disease: Secondary | ICD-10-CM | POA: Diagnosis not present

## 2018-10-03 DIAGNOSIS — Z79899 Other long term (current) drug therapy: Secondary | ICD-10-CM | POA: Diagnosis not present

## 2018-10-03 DIAGNOSIS — D631 Anemia in chronic kidney disease: Secondary | ICD-10-CM | POA: Diagnosis not present

## 2018-10-03 DIAGNOSIS — R17 Unspecified jaundice: Secondary | ICD-10-CM | POA: Diagnosis not present

## 2018-10-04 DIAGNOSIS — K769 Liver disease, unspecified: Secondary | ICD-10-CM | POA: Diagnosis not present

## 2018-10-04 DIAGNOSIS — N186 End stage renal disease: Secondary | ICD-10-CM | POA: Diagnosis not present

## 2018-10-04 DIAGNOSIS — E440 Moderate protein-calorie malnutrition: Secondary | ICD-10-CM | POA: Diagnosis not present

## 2018-10-04 DIAGNOSIS — Z4932 Encounter for adequacy testing for peritoneal dialysis: Secondary | ICD-10-CM | POA: Diagnosis not present

## 2018-10-04 DIAGNOSIS — D631 Anemia in chronic kidney disease: Secondary | ICD-10-CM | POA: Diagnosis not present

## 2018-10-04 DIAGNOSIS — N2581 Secondary hyperparathyroidism of renal origin: Secondary | ICD-10-CM | POA: Diagnosis not present

## 2018-10-05 DIAGNOSIS — K769 Liver disease, unspecified: Secondary | ICD-10-CM | POA: Diagnosis not present

## 2018-10-05 DIAGNOSIS — D631 Anemia in chronic kidney disease: Secondary | ICD-10-CM | POA: Diagnosis not present

## 2018-10-05 DIAGNOSIS — N186 End stage renal disease: Secondary | ICD-10-CM | POA: Diagnosis not present

## 2018-10-05 DIAGNOSIS — E440 Moderate protein-calorie malnutrition: Secondary | ICD-10-CM | POA: Diagnosis not present

## 2018-10-05 DIAGNOSIS — N2581 Secondary hyperparathyroidism of renal origin: Secondary | ICD-10-CM | POA: Diagnosis not present

## 2018-10-05 DIAGNOSIS — Z4932 Encounter for adequacy testing for peritoneal dialysis: Secondary | ICD-10-CM | POA: Diagnosis not present

## 2018-10-06 DIAGNOSIS — E440 Moderate protein-calorie malnutrition: Secondary | ICD-10-CM | POA: Diagnosis not present

## 2018-10-06 DIAGNOSIS — Z4932 Encounter for adequacy testing for peritoneal dialysis: Secondary | ICD-10-CM | POA: Diagnosis not present

## 2018-10-06 DIAGNOSIS — D631 Anemia in chronic kidney disease: Secondary | ICD-10-CM | POA: Diagnosis not present

## 2018-10-06 DIAGNOSIS — N186 End stage renal disease: Secondary | ICD-10-CM | POA: Diagnosis not present

## 2018-10-06 DIAGNOSIS — N2581 Secondary hyperparathyroidism of renal origin: Secondary | ICD-10-CM | POA: Diagnosis not present

## 2018-10-06 DIAGNOSIS — K769 Liver disease, unspecified: Secondary | ICD-10-CM | POA: Diagnosis not present

## 2018-10-07 DIAGNOSIS — Z4932 Encounter for adequacy testing for peritoneal dialysis: Secondary | ICD-10-CM | POA: Diagnosis not present

## 2018-10-07 DIAGNOSIS — K769 Liver disease, unspecified: Secondary | ICD-10-CM | POA: Diagnosis not present

## 2018-10-07 DIAGNOSIS — N2581 Secondary hyperparathyroidism of renal origin: Secondary | ICD-10-CM | POA: Diagnosis not present

## 2018-10-07 DIAGNOSIS — N186 End stage renal disease: Secondary | ICD-10-CM | POA: Diagnosis not present

## 2018-10-07 DIAGNOSIS — D631 Anemia in chronic kidney disease: Secondary | ICD-10-CM | POA: Diagnosis not present

## 2018-10-07 DIAGNOSIS — E440 Moderate protein-calorie malnutrition: Secondary | ICD-10-CM | POA: Diagnosis not present

## 2018-10-08 DIAGNOSIS — D631 Anemia in chronic kidney disease: Secondary | ICD-10-CM | POA: Diagnosis not present

## 2018-10-08 DIAGNOSIS — Z4932 Encounter for adequacy testing for peritoneal dialysis: Secondary | ICD-10-CM | POA: Diagnosis not present

## 2018-10-08 DIAGNOSIS — N186 End stage renal disease: Secondary | ICD-10-CM | POA: Diagnosis not present

## 2018-10-08 DIAGNOSIS — N2581 Secondary hyperparathyroidism of renal origin: Secondary | ICD-10-CM | POA: Diagnosis not present

## 2018-10-08 DIAGNOSIS — K769 Liver disease, unspecified: Secondary | ICD-10-CM | POA: Diagnosis not present

## 2018-10-08 DIAGNOSIS — R82998 Other abnormal findings in urine: Secondary | ICD-10-CM | POA: Diagnosis not present

## 2018-10-08 DIAGNOSIS — E440 Moderate protein-calorie malnutrition: Secondary | ICD-10-CM | POA: Diagnosis not present

## 2018-10-09 DIAGNOSIS — D631 Anemia in chronic kidney disease: Secondary | ICD-10-CM | POA: Diagnosis not present

## 2018-10-09 DIAGNOSIS — N2581 Secondary hyperparathyroidism of renal origin: Secondary | ICD-10-CM | POA: Diagnosis not present

## 2018-10-09 DIAGNOSIS — K769 Liver disease, unspecified: Secondary | ICD-10-CM | POA: Diagnosis not present

## 2018-10-09 DIAGNOSIS — N186 End stage renal disease: Secondary | ICD-10-CM | POA: Diagnosis not present

## 2018-10-09 DIAGNOSIS — E440 Moderate protein-calorie malnutrition: Secondary | ICD-10-CM | POA: Diagnosis not present

## 2018-10-09 DIAGNOSIS — Z4932 Encounter for adequacy testing for peritoneal dialysis: Secondary | ICD-10-CM | POA: Diagnosis not present

## 2018-10-10 DIAGNOSIS — K769 Liver disease, unspecified: Secondary | ICD-10-CM | POA: Diagnosis not present

## 2018-10-10 DIAGNOSIS — N2581 Secondary hyperparathyroidism of renal origin: Secondary | ICD-10-CM | POA: Diagnosis not present

## 2018-10-10 DIAGNOSIS — D631 Anemia in chronic kidney disease: Secondary | ICD-10-CM | POA: Diagnosis not present

## 2018-10-10 DIAGNOSIS — E440 Moderate protein-calorie malnutrition: Secondary | ICD-10-CM | POA: Diagnosis not present

## 2018-10-10 DIAGNOSIS — Z4932 Encounter for adequacy testing for peritoneal dialysis: Secondary | ICD-10-CM | POA: Diagnosis not present

## 2018-10-10 DIAGNOSIS — N186 End stage renal disease: Secondary | ICD-10-CM | POA: Diagnosis not present

## 2018-10-11 DIAGNOSIS — K769 Liver disease, unspecified: Secondary | ICD-10-CM | POA: Diagnosis not present

## 2018-10-11 DIAGNOSIS — E440 Moderate protein-calorie malnutrition: Secondary | ICD-10-CM | POA: Diagnosis not present

## 2018-10-11 DIAGNOSIS — N186 End stage renal disease: Secondary | ICD-10-CM | POA: Diagnosis not present

## 2018-10-11 DIAGNOSIS — D631 Anemia in chronic kidney disease: Secondary | ICD-10-CM | POA: Diagnosis not present

## 2018-10-11 DIAGNOSIS — Z4932 Encounter for adequacy testing for peritoneal dialysis: Secondary | ICD-10-CM | POA: Diagnosis not present

## 2018-10-11 DIAGNOSIS — N2581 Secondary hyperparathyroidism of renal origin: Secondary | ICD-10-CM | POA: Diagnosis not present

## 2018-10-12 DIAGNOSIS — E440 Moderate protein-calorie malnutrition: Secondary | ICD-10-CM | POA: Diagnosis not present

## 2018-10-12 DIAGNOSIS — K769 Liver disease, unspecified: Secondary | ICD-10-CM | POA: Diagnosis not present

## 2018-10-12 DIAGNOSIS — N186 End stage renal disease: Secondary | ICD-10-CM | POA: Diagnosis not present

## 2018-10-12 DIAGNOSIS — D631 Anemia in chronic kidney disease: Secondary | ICD-10-CM | POA: Diagnosis not present

## 2018-10-12 DIAGNOSIS — Z4932 Encounter for adequacy testing for peritoneal dialysis: Secondary | ICD-10-CM | POA: Diagnosis not present

## 2018-10-12 DIAGNOSIS — N2581 Secondary hyperparathyroidism of renal origin: Secondary | ICD-10-CM | POA: Diagnosis not present

## 2018-10-13 DIAGNOSIS — D631 Anemia in chronic kidney disease: Secondary | ICD-10-CM | POA: Diagnosis not present

## 2018-10-13 DIAGNOSIS — E440 Moderate protein-calorie malnutrition: Secondary | ICD-10-CM | POA: Diagnosis not present

## 2018-10-13 DIAGNOSIS — N2581 Secondary hyperparathyroidism of renal origin: Secondary | ICD-10-CM | POA: Diagnosis not present

## 2018-10-13 DIAGNOSIS — Z4932 Encounter for adequacy testing for peritoneal dialysis: Secondary | ICD-10-CM | POA: Diagnosis not present

## 2018-10-13 DIAGNOSIS — K769 Liver disease, unspecified: Secondary | ICD-10-CM | POA: Diagnosis not present

## 2018-10-13 DIAGNOSIS — N186 End stage renal disease: Secondary | ICD-10-CM | POA: Diagnosis not present

## 2018-10-14 DIAGNOSIS — D631 Anemia in chronic kidney disease: Secondary | ICD-10-CM | POA: Diagnosis not present

## 2018-10-14 DIAGNOSIS — K769 Liver disease, unspecified: Secondary | ICD-10-CM | POA: Diagnosis not present

## 2018-10-14 DIAGNOSIS — N186 End stage renal disease: Secondary | ICD-10-CM | POA: Diagnosis not present

## 2018-10-14 DIAGNOSIS — Z4932 Encounter for adequacy testing for peritoneal dialysis: Secondary | ICD-10-CM | POA: Diagnosis not present

## 2018-10-14 DIAGNOSIS — N2581 Secondary hyperparathyroidism of renal origin: Secondary | ICD-10-CM | POA: Diagnosis not present

## 2018-10-14 DIAGNOSIS — E440 Moderate protein-calorie malnutrition: Secondary | ICD-10-CM | POA: Diagnosis not present

## 2018-10-15 DIAGNOSIS — N186 End stage renal disease: Secondary | ICD-10-CM | POA: Diagnosis not present

## 2018-10-15 DIAGNOSIS — N2581 Secondary hyperparathyroidism of renal origin: Secondary | ICD-10-CM | POA: Diagnosis not present

## 2018-10-15 DIAGNOSIS — D631 Anemia in chronic kidney disease: Secondary | ICD-10-CM | POA: Diagnosis not present

## 2018-10-15 DIAGNOSIS — K769 Liver disease, unspecified: Secondary | ICD-10-CM | POA: Diagnosis not present

## 2018-10-15 DIAGNOSIS — E440 Moderate protein-calorie malnutrition: Secondary | ICD-10-CM | POA: Diagnosis not present

## 2018-10-15 DIAGNOSIS — Z4932 Encounter for adequacy testing for peritoneal dialysis: Secondary | ICD-10-CM | POA: Diagnosis not present

## 2018-10-16 DIAGNOSIS — N2581 Secondary hyperparathyroidism of renal origin: Secondary | ICD-10-CM | POA: Diagnosis not present

## 2018-10-16 DIAGNOSIS — E440 Moderate protein-calorie malnutrition: Secondary | ICD-10-CM | POA: Diagnosis not present

## 2018-10-16 DIAGNOSIS — N186 End stage renal disease: Secondary | ICD-10-CM | POA: Diagnosis not present

## 2018-10-16 DIAGNOSIS — Z4932 Encounter for adequacy testing for peritoneal dialysis: Secondary | ICD-10-CM | POA: Diagnosis not present

## 2018-10-16 DIAGNOSIS — K769 Liver disease, unspecified: Secondary | ICD-10-CM | POA: Diagnosis not present

## 2018-10-16 DIAGNOSIS — D631 Anemia in chronic kidney disease: Secondary | ICD-10-CM | POA: Diagnosis not present

## 2018-10-17 DIAGNOSIS — D631 Anemia in chronic kidney disease: Secondary | ICD-10-CM | POA: Diagnosis not present

## 2018-10-17 DIAGNOSIS — K769 Liver disease, unspecified: Secondary | ICD-10-CM | POA: Diagnosis not present

## 2018-10-17 DIAGNOSIS — E440 Moderate protein-calorie malnutrition: Secondary | ICD-10-CM | POA: Diagnosis not present

## 2018-10-17 DIAGNOSIS — N2581 Secondary hyperparathyroidism of renal origin: Secondary | ICD-10-CM | POA: Diagnosis not present

## 2018-10-17 DIAGNOSIS — N186 End stage renal disease: Secondary | ICD-10-CM | POA: Diagnosis not present

## 2018-10-17 DIAGNOSIS — Z4932 Encounter for adequacy testing for peritoneal dialysis: Secondary | ICD-10-CM | POA: Diagnosis not present

## 2018-10-18 DIAGNOSIS — N2581 Secondary hyperparathyroidism of renal origin: Secondary | ICD-10-CM | POA: Diagnosis not present

## 2018-10-18 DIAGNOSIS — D631 Anemia in chronic kidney disease: Secondary | ICD-10-CM | POA: Diagnosis not present

## 2018-10-18 DIAGNOSIS — K769 Liver disease, unspecified: Secondary | ICD-10-CM | POA: Diagnosis not present

## 2018-10-18 DIAGNOSIS — E440 Moderate protein-calorie malnutrition: Secondary | ICD-10-CM | POA: Diagnosis not present

## 2018-10-18 DIAGNOSIS — Z4932 Encounter for adequacy testing for peritoneal dialysis: Secondary | ICD-10-CM | POA: Diagnosis not present

## 2018-10-18 DIAGNOSIS — N186 End stage renal disease: Secondary | ICD-10-CM | POA: Diagnosis not present

## 2018-10-19 DIAGNOSIS — E440 Moderate protein-calorie malnutrition: Secondary | ICD-10-CM | POA: Diagnosis not present

## 2018-10-19 DIAGNOSIS — Z4932 Encounter for adequacy testing for peritoneal dialysis: Secondary | ICD-10-CM | POA: Diagnosis not present

## 2018-10-19 DIAGNOSIS — D631 Anemia in chronic kidney disease: Secondary | ICD-10-CM | POA: Diagnosis not present

## 2018-10-19 DIAGNOSIS — N2581 Secondary hyperparathyroidism of renal origin: Secondary | ICD-10-CM | POA: Diagnosis not present

## 2018-10-19 DIAGNOSIS — K769 Liver disease, unspecified: Secondary | ICD-10-CM | POA: Diagnosis not present

## 2018-10-19 DIAGNOSIS — N186 End stage renal disease: Secondary | ICD-10-CM | POA: Diagnosis not present

## 2018-10-20 DIAGNOSIS — N186 End stage renal disease: Secondary | ICD-10-CM | POA: Diagnosis not present

## 2018-10-20 DIAGNOSIS — K769 Liver disease, unspecified: Secondary | ICD-10-CM | POA: Diagnosis not present

## 2018-10-20 DIAGNOSIS — E440 Moderate protein-calorie malnutrition: Secondary | ICD-10-CM | POA: Diagnosis not present

## 2018-10-20 DIAGNOSIS — Z4932 Encounter for adequacy testing for peritoneal dialysis: Secondary | ICD-10-CM | POA: Diagnosis not present

## 2018-10-20 DIAGNOSIS — D631 Anemia in chronic kidney disease: Secondary | ICD-10-CM | POA: Diagnosis not present

## 2018-10-20 DIAGNOSIS — N2581 Secondary hyperparathyroidism of renal origin: Secondary | ICD-10-CM | POA: Diagnosis not present

## 2018-10-21 DIAGNOSIS — E440 Moderate protein-calorie malnutrition: Secondary | ICD-10-CM | POA: Diagnosis not present

## 2018-10-21 DIAGNOSIS — N186 End stage renal disease: Secondary | ICD-10-CM | POA: Diagnosis not present

## 2018-10-21 DIAGNOSIS — N2581 Secondary hyperparathyroidism of renal origin: Secondary | ICD-10-CM | POA: Diagnosis not present

## 2018-10-21 DIAGNOSIS — D631 Anemia in chronic kidney disease: Secondary | ICD-10-CM | POA: Diagnosis not present

## 2018-10-21 DIAGNOSIS — K769 Liver disease, unspecified: Secondary | ICD-10-CM | POA: Diagnosis not present

## 2018-10-21 DIAGNOSIS — Z4932 Encounter for adequacy testing for peritoneal dialysis: Secondary | ICD-10-CM | POA: Diagnosis not present

## 2018-10-22 DIAGNOSIS — E440 Moderate protein-calorie malnutrition: Secondary | ICD-10-CM | POA: Diagnosis not present

## 2018-10-22 DIAGNOSIS — K769 Liver disease, unspecified: Secondary | ICD-10-CM | POA: Diagnosis not present

## 2018-10-22 DIAGNOSIS — N186 End stage renal disease: Secondary | ICD-10-CM | POA: Diagnosis not present

## 2018-10-22 DIAGNOSIS — Z4932 Encounter for adequacy testing for peritoneal dialysis: Secondary | ICD-10-CM | POA: Diagnosis not present

## 2018-10-22 DIAGNOSIS — D631 Anemia in chronic kidney disease: Secondary | ICD-10-CM | POA: Diagnosis not present

## 2018-10-22 DIAGNOSIS — N2581 Secondary hyperparathyroidism of renal origin: Secondary | ICD-10-CM | POA: Diagnosis not present

## 2018-10-23 DIAGNOSIS — E440 Moderate protein-calorie malnutrition: Secondary | ICD-10-CM | POA: Diagnosis not present

## 2018-10-23 DIAGNOSIS — N2581 Secondary hyperparathyroidism of renal origin: Secondary | ICD-10-CM | POA: Diagnosis not present

## 2018-10-23 DIAGNOSIS — Z4932 Encounter for adequacy testing for peritoneal dialysis: Secondary | ICD-10-CM | POA: Diagnosis not present

## 2018-10-23 DIAGNOSIS — K769 Liver disease, unspecified: Secondary | ICD-10-CM | POA: Diagnosis not present

## 2018-10-23 DIAGNOSIS — N186 End stage renal disease: Secondary | ICD-10-CM | POA: Diagnosis not present

## 2018-10-23 DIAGNOSIS — D631 Anemia in chronic kidney disease: Secondary | ICD-10-CM | POA: Diagnosis not present

## 2018-10-24 DIAGNOSIS — D631 Anemia in chronic kidney disease: Secondary | ICD-10-CM | POA: Diagnosis not present

## 2018-10-24 DIAGNOSIS — K769 Liver disease, unspecified: Secondary | ICD-10-CM | POA: Diagnosis not present

## 2018-10-24 DIAGNOSIS — N2581 Secondary hyperparathyroidism of renal origin: Secondary | ICD-10-CM | POA: Diagnosis not present

## 2018-10-24 DIAGNOSIS — Z4932 Encounter for adequacy testing for peritoneal dialysis: Secondary | ICD-10-CM | POA: Diagnosis not present

## 2018-10-24 DIAGNOSIS — E440 Moderate protein-calorie malnutrition: Secondary | ICD-10-CM | POA: Diagnosis not present

## 2018-10-24 DIAGNOSIS — N186 End stage renal disease: Secondary | ICD-10-CM | POA: Diagnosis not present

## 2018-10-25 DIAGNOSIS — K769 Liver disease, unspecified: Secondary | ICD-10-CM | POA: Diagnosis not present

## 2018-10-25 DIAGNOSIS — E440 Moderate protein-calorie malnutrition: Secondary | ICD-10-CM | POA: Diagnosis not present

## 2018-10-25 DIAGNOSIS — N186 End stage renal disease: Secondary | ICD-10-CM | POA: Diagnosis not present

## 2018-10-25 DIAGNOSIS — N2581 Secondary hyperparathyroidism of renal origin: Secondary | ICD-10-CM | POA: Diagnosis not present

## 2018-10-25 DIAGNOSIS — Z4932 Encounter for adequacy testing for peritoneal dialysis: Secondary | ICD-10-CM | POA: Diagnosis not present

## 2018-10-25 DIAGNOSIS — D631 Anemia in chronic kidney disease: Secondary | ICD-10-CM | POA: Diagnosis not present

## 2018-10-26 DIAGNOSIS — N186 End stage renal disease: Secondary | ICD-10-CM | POA: Diagnosis not present

## 2018-10-26 DIAGNOSIS — E440 Moderate protein-calorie malnutrition: Secondary | ICD-10-CM | POA: Diagnosis not present

## 2018-10-26 DIAGNOSIS — Z4932 Encounter for adequacy testing for peritoneal dialysis: Secondary | ICD-10-CM | POA: Diagnosis not present

## 2018-10-26 DIAGNOSIS — D631 Anemia in chronic kidney disease: Secondary | ICD-10-CM | POA: Diagnosis not present

## 2018-10-26 DIAGNOSIS — K769 Liver disease, unspecified: Secondary | ICD-10-CM | POA: Diagnosis not present

## 2018-10-26 DIAGNOSIS — N2581 Secondary hyperparathyroidism of renal origin: Secondary | ICD-10-CM | POA: Diagnosis not present

## 2018-10-27 DIAGNOSIS — Z4932 Encounter for adequacy testing for peritoneal dialysis: Secondary | ICD-10-CM | POA: Diagnosis not present

## 2018-10-27 DIAGNOSIS — N186 End stage renal disease: Secondary | ICD-10-CM | POA: Diagnosis not present

## 2018-10-27 DIAGNOSIS — E440 Moderate protein-calorie malnutrition: Secondary | ICD-10-CM | POA: Diagnosis not present

## 2018-10-27 DIAGNOSIS — K769 Liver disease, unspecified: Secondary | ICD-10-CM | POA: Diagnosis not present

## 2018-10-27 DIAGNOSIS — N2581 Secondary hyperparathyroidism of renal origin: Secondary | ICD-10-CM | POA: Diagnosis not present

## 2018-10-27 DIAGNOSIS — D631 Anemia in chronic kidney disease: Secondary | ICD-10-CM | POA: Diagnosis not present

## 2018-10-28 DIAGNOSIS — E440 Moderate protein-calorie malnutrition: Secondary | ICD-10-CM | POA: Diagnosis not present

## 2018-10-28 DIAGNOSIS — N2581 Secondary hyperparathyroidism of renal origin: Secondary | ICD-10-CM | POA: Diagnosis not present

## 2018-10-28 DIAGNOSIS — D631 Anemia in chronic kidney disease: Secondary | ICD-10-CM | POA: Diagnosis not present

## 2018-10-28 DIAGNOSIS — K769 Liver disease, unspecified: Secondary | ICD-10-CM | POA: Diagnosis not present

## 2018-10-28 DIAGNOSIS — N186 End stage renal disease: Secondary | ICD-10-CM | POA: Diagnosis not present

## 2018-10-28 DIAGNOSIS — Z4932 Encounter for adequacy testing for peritoneal dialysis: Secondary | ICD-10-CM | POA: Diagnosis not present

## 2018-10-29 DIAGNOSIS — D631 Anemia in chronic kidney disease: Secondary | ICD-10-CM | POA: Diagnosis not present

## 2018-10-29 DIAGNOSIS — Z4932 Encounter for adequacy testing for peritoneal dialysis: Secondary | ICD-10-CM | POA: Diagnosis not present

## 2018-10-29 DIAGNOSIS — K769 Liver disease, unspecified: Secondary | ICD-10-CM | POA: Diagnosis not present

## 2018-10-29 DIAGNOSIS — E440 Moderate protein-calorie malnutrition: Secondary | ICD-10-CM | POA: Diagnosis not present

## 2018-10-29 DIAGNOSIS — N2581 Secondary hyperparathyroidism of renal origin: Secondary | ICD-10-CM | POA: Diagnosis not present

## 2018-10-29 DIAGNOSIS — N186 End stage renal disease: Secondary | ICD-10-CM | POA: Diagnosis not present

## 2018-10-30 DIAGNOSIS — Z4932 Encounter for adequacy testing for peritoneal dialysis: Secondary | ICD-10-CM | POA: Diagnosis not present

## 2018-10-30 DIAGNOSIS — K769 Liver disease, unspecified: Secondary | ICD-10-CM | POA: Diagnosis not present

## 2018-10-30 DIAGNOSIS — N186 End stage renal disease: Secondary | ICD-10-CM | POA: Diagnosis not present

## 2018-10-30 DIAGNOSIS — E440 Moderate protein-calorie malnutrition: Secondary | ICD-10-CM | POA: Diagnosis not present

## 2018-10-30 DIAGNOSIS — N2581 Secondary hyperparathyroidism of renal origin: Secondary | ICD-10-CM | POA: Diagnosis not present

## 2018-10-30 DIAGNOSIS — D631 Anemia in chronic kidney disease: Secondary | ICD-10-CM | POA: Diagnosis not present

## 2018-10-31 DIAGNOSIS — N186 End stage renal disease: Secondary | ICD-10-CM | POA: Diagnosis not present

## 2018-10-31 DIAGNOSIS — Z4932 Encounter for adequacy testing for peritoneal dialysis: Secondary | ICD-10-CM | POA: Diagnosis not present

## 2018-10-31 DIAGNOSIS — N2581 Secondary hyperparathyroidism of renal origin: Secondary | ICD-10-CM | POA: Diagnosis not present

## 2018-10-31 DIAGNOSIS — K769 Liver disease, unspecified: Secondary | ICD-10-CM | POA: Diagnosis not present

## 2018-10-31 DIAGNOSIS — D631 Anemia in chronic kidney disease: Secondary | ICD-10-CM | POA: Diagnosis not present

## 2018-10-31 DIAGNOSIS — E440 Moderate protein-calorie malnutrition: Secondary | ICD-10-CM | POA: Diagnosis not present

## 2018-11-01 DIAGNOSIS — K769 Liver disease, unspecified: Secondary | ICD-10-CM | POA: Diagnosis not present

## 2018-11-01 DIAGNOSIS — D631 Anemia in chronic kidney disease: Secondary | ICD-10-CM | POA: Diagnosis not present

## 2018-11-01 DIAGNOSIS — N2581 Secondary hyperparathyroidism of renal origin: Secondary | ICD-10-CM | POA: Diagnosis not present

## 2018-11-01 DIAGNOSIS — N186 End stage renal disease: Secondary | ICD-10-CM | POA: Diagnosis not present

## 2018-11-01 DIAGNOSIS — E440 Moderate protein-calorie malnutrition: Secondary | ICD-10-CM | POA: Diagnosis not present

## 2018-11-01 DIAGNOSIS — Z4932 Encounter for adequacy testing for peritoneal dialysis: Secondary | ICD-10-CM | POA: Diagnosis not present

## 2018-11-02 DIAGNOSIS — N186 End stage renal disease: Secondary | ICD-10-CM | POA: Diagnosis not present

## 2018-11-02 DIAGNOSIS — Z4932 Encounter for adequacy testing for peritoneal dialysis: Secondary | ICD-10-CM | POA: Diagnosis not present

## 2018-11-02 DIAGNOSIS — N2581 Secondary hyperparathyroidism of renal origin: Secondary | ICD-10-CM | POA: Diagnosis not present

## 2018-11-02 DIAGNOSIS — E440 Moderate protein-calorie malnutrition: Secondary | ICD-10-CM | POA: Diagnosis not present

## 2018-11-02 DIAGNOSIS — D631 Anemia in chronic kidney disease: Secondary | ICD-10-CM | POA: Diagnosis not present

## 2018-11-02 DIAGNOSIS — K769 Liver disease, unspecified: Secondary | ICD-10-CM | POA: Diagnosis not present

## 2018-11-03 DIAGNOSIS — Z4932 Encounter for adequacy testing for peritoneal dialysis: Secondary | ICD-10-CM | POA: Diagnosis not present

## 2018-11-03 DIAGNOSIS — K769 Liver disease, unspecified: Secondary | ICD-10-CM | POA: Diagnosis not present

## 2018-11-03 DIAGNOSIS — N2581 Secondary hyperparathyroidism of renal origin: Secondary | ICD-10-CM | POA: Diagnosis not present

## 2018-11-03 DIAGNOSIS — Z992 Dependence on renal dialysis: Secondary | ICD-10-CM | POA: Diagnosis not present

## 2018-11-03 DIAGNOSIS — N186 End stage renal disease: Secondary | ICD-10-CM | POA: Diagnosis not present

## 2018-11-03 DIAGNOSIS — I129 Hypertensive chronic kidney disease with stage 1 through stage 4 chronic kidney disease, or unspecified chronic kidney disease: Secondary | ICD-10-CM | POA: Diagnosis not present

## 2018-11-03 DIAGNOSIS — D631 Anemia in chronic kidney disease: Secondary | ICD-10-CM | POA: Diagnosis not present

## 2018-11-03 DIAGNOSIS — N2589 Other disorders resulting from impaired renal tubular function: Secondary | ICD-10-CM | POA: Diagnosis not present

## 2018-11-03 DIAGNOSIS — D509 Iron deficiency anemia, unspecified: Secondary | ICD-10-CM | POA: Diagnosis not present

## 2018-11-04 DIAGNOSIS — N2581 Secondary hyperparathyroidism of renal origin: Secondary | ICD-10-CM | POA: Diagnosis not present

## 2018-11-04 DIAGNOSIS — Z4932 Encounter for adequacy testing for peritoneal dialysis: Secondary | ICD-10-CM | POA: Diagnosis not present

## 2018-11-04 DIAGNOSIS — D509 Iron deficiency anemia, unspecified: Secondary | ICD-10-CM | POA: Diagnosis not present

## 2018-11-04 DIAGNOSIS — N186 End stage renal disease: Secondary | ICD-10-CM | POA: Diagnosis not present

## 2018-11-04 DIAGNOSIS — N2589 Other disorders resulting from impaired renal tubular function: Secondary | ICD-10-CM | POA: Diagnosis not present

## 2018-11-04 DIAGNOSIS — K769 Liver disease, unspecified: Secondary | ICD-10-CM | POA: Diagnosis not present

## 2018-11-05 DIAGNOSIS — N2581 Secondary hyperparathyroidism of renal origin: Secondary | ICD-10-CM | POA: Diagnosis not present

## 2018-11-05 DIAGNOSIS — N2589 Other disorders resulting from impaired renal tubular function: Secondary | ICD-10-CM | POA: Diagnosis not present

## 2018-11-05 DIAGNOSIS — K769 Liver disease, unspecified: Secondary | ICD-10-CM | POA: Diagnosis not present

## 2018-11-05 DIAGNOSIS — N186 End stage renal disease: Secondary | ICD-10-CM | POA: Diagnosis not present

## 2018-11-05 DIAGNOSIS — D509 Iron deficiency anemia, unspecified: Secondary | ICD-10-CM | POA: Diagnosis not present

## 2018-11-05 DIAGNOSIS — Z4932 Encounter for adequacy testing for peritoneal dialysis: Secondary | ICD-10-CM | POA: Diagnosis not present

## 2018-11-06 DIAGNOSIS — Z4932 Encounter for adequacy testing for peritoneal dialysis: Secondary | ICD-10-CM | POA: Diagnosis not present

## 2018-11-06 DIAGNOSIS — D509 Iron deficiency anemia, unspecified: Secondary | ICD-10-CM | POA: Diagnosis not present

## 2018-11-06 DIAGNOSIS — K769 Liver disease, unspecified: Secondary | ICD-10-CM | POA: Diagnosis not present

## 2018-11-06 DIAGNOSIS — N186 End stage renal disease: Secondary | ICD-10-CM | POA: Diagnosis not present

## 2018-11-06 DIAGNOSIS — N2589 Other disorders resulting from impaired renal tubular function: Secondary | ICD-10-CM | POA: Diagnosis not present

## 2018-11-06 DIAGNOSIS — N2581 Secondary hyperparathyroidism of renal origin: Secondary | ICD-10-CM | POA: Diagnosis not present

## 2018-11-07 DIAGNOSIS — Z4932 Encounter for adequacy testing for peritoneal dialysis: Secondary | ICD-10-CM | POA: Diagnosis not present

## 2018-11-07 DIAGNOSIS — K769 Liver disease, unspecified: Secondary | ICD-10-CM | POA: Diagnosis not present

## 2018-11-07 DIAGNOSIS — N186 End stage renal disease: Secondary | ICD-10-CM | POA: Diagnosis not present

## 2018-11-07 DIAGNOSIS — N2581 Secondary hyperparathyroidism of renal origin: Secondary | ICD-10-CM | POA: Diagnosis not present

## 2018-11-07 DIAGNOSIS — D509 Iron deficiency anemia, unspecified: Secondary | ICD-10-CM | POA: Diagnosis not present

## 2018-11-07 DIAGNOSIS — N2589 Other disorders resulting from impaired renal tubular function: Secondary | ICD-10-CM | POA: Diagnosis not present

## 2018-11-08 DIAGNOSIS — D509 Iron deficiency anemia, unspecified: Secondary | ICD-10-CM | POA: Diagnosis not present

## 2018-11-08 DIAGNOSIS — K769 Liver disease, unspecified: Secondary | ICD-10-CM | POA: Diagnosis not present

## 2018-11-08 DIAGNOSIS — Z4932 Encounter for adequacy testing for peritoneal dialysis: Secondary | ICD-10-CM | POA: Diagnosis not present

## 2018-11-08 DIAGNOSIS — N2589 Other disorders resulting from impaired renal tubular function: Secondary | ICD-10-CM | POA: Diagnosis not present

## 2018-11-08 DIAGNOSIS — N2581 Secondary hyperparathyroidism of renal origin: Secondary | ICD-10-CM | POA: Diagnosis not present

## 2018-11-08 DIAGNOSIS — N186 End stage renal disease: Secondary | ICD-10-CM | POA: Diagnosis not present

## 2018-11-09 DIAGNOSIS — N186 End stage renal disease: Secondary | ICD-10-CM | POA: Diagnosis not present

## 2018-11-09 DIAGNOSIS — K769 Liver disease, unspecified: Secondary | ICD-10-CM | POA: Diagnosis not present

## 2018-11-09 DIAGNOSIS — N2589 Other disorders resulting from impaired renal tubular function: Secondary | ICD-10-CM | POA: Diagnosis not present

## 2018-11-09 DIAGNOSIS — R82998 Other abnormal findings in urine: Secondary | ICD-10-CM | POA: Diagnosis not present

## 2018-11-09 DIAGNOSIS — E7849 Other hyperlipidemia: Secondary | ICD-10-CM | POA: Diagnosis not present

## 2018-11-09 DIAGNOSIS — Z4932 Encounter for adequacy testing for peritoneal dialysis: Secondary | ICD-10-CM | POA: Diagnosis not present

## 2018-11-09 DIAGNOSIS — E1129 Type 2 diabetes mellitus with other diabetic kidney complication: Secondary | ICD-10-CM | POA: Diagnosis not present

## 2018-11-09 DIAGNOSIS — N2581 Secondary hyperparathyroidism of renal origin: Secondary | ICD-10-CM | POA: Diagnosis not present

## 2018-11-09 DIAGNOSIS — D509 Iron deficiency anemia, unspecified: Secondary | ICD-10-CM | POA: Diagnosis not present

## 2018-11-10 DIAGNOSIS — N186 End stage renal disease: Secondary | ICD-10-CM | POA: Diagnosis not present

## 2018-11-10 DIAGNOSIS — D509 Iron deficiency anemia, unspecified: Secondary | ICD-10-CM | POA: Diagnosis not present

## 2018-11-10 DIAGNOSIS — N2589 Other disorders resulting from impaired renal tubular function: Secondary | ICD-10-CM | POA: Diagnosis not present

## 2018-11-10 DIAGNOSIS — N2581 Secondary hyperparathyroidism of renal origin: Secondary | ICD-10-CM | POA: Diagnosis not present

## 2018-11-10 DIAGNOSIS — Z4932 Encounter for adequacy testing for peritoneal dialysis: Secondary | ICD-10-CM | POA: Diagnosis not present

## 2018-11-10 DIAGNOSIS — K769 Liver disease, unspecified: Secondary | ICD-10-CM | POA: Diagnosis not present

## 2018-11-11 DIAGNOSIS — N186 End stage renal disease: Secondary | ICD-10-CM | POA: Diagnosis not present

## 2018-11-11 DIAGNOSIS — K769 Liver disease, unspecified: Secondary | ICD-10-CM | POA: Diagnosis not present

## 2018-11-11 DIAGNOSIS — Z4932 Encounter for adequacy testing for peritoneal dialysis: Secondary | ICD-10-CM | POA: Diagnosis not present

## 2018-11-11 DIAGNOSIS — N2589 Other disorders resulting from impaired renal tubular function: Secondary | ICD-10-CM | POA: Diagnosis not present

## 2018-11-11 DIAGNOSIS — D509 Iron deficiency anemia, unspecified: Secondary | ICD-10-CM | POA: Diagnosis not present

## 2018-11-11 DIAGNOSIS — N2581 Secondary hyperparathyroidism of renal origin: Secondary | ICD-10-CM | POA: Diagnosis not present

## 2018-11-12 DIAGNOSIS — N2581 Secondary hyperparathyroidism of renal origin: Secondary | ICD-10-CM | POA: Diagnosis not present

## 2018-11-12 DIAGNOSIS — Z4932 Encounter for adequacy testing for peritoneal dialysis: Secondary | ICD-10-CM | POA: Diagnosis not present

## 2018-11-12 DIAGNOSIS — K769 Liver disease, unspecified: Secondary | ICD-10-CM | POA: Diagnosis not present

## 2018-11-12 DIAGNOSIS — D509 Iron deficiency anemia, unspecified: Secondary | ICD-10-CM | POA: Diagnosis not present

## 2018-11-12 DIAGNOSIS — N186 End stage renal disease: Secondary | ICD-10-CM | POA: Diagnosis not present

## 2018-11-12 DIAGNOSIS — N2589 Other disorders resulting from impaired renal tubular function: Secondary | ICD-10-CM | POA: Diagnosis not present

## 2018-11-13 DIAGNOSIS — K769 Liver disease, unspecified: Secondary | ICD-10-CM | POA: Diagnosis not present

## 2018-11-13 DIAGNOSIS — N186 End stage renal disease: Secondary | ICD-10-CM | POA: Diagnosis not present

## 2018-11-13 DIAGNOSIS — N2589 Other disorders resulting from impaired renal tubular function: Secondary | ICD-10-CM | POA: Diagnosis not present

## 2018-11-13 DIAGNOSIS — D509 Iron deficiency anemia, unspecified: Secondary | ICD-10-CM | POA: Diagnosis not present

## 2018-11-13 DIAGNOSIS — Z4932 Encounter for adequacy testing for peritoneal dialysis: Secondary | ICD-10-CM | POA: Diagnosis not present

## 2018-11-13 DIAGNOSIS — N2581 Secondary hyperparathyroidism of renal origin: Secondary | ICD-10-CM | POA: Diagnosis not present

## 2018-11-14 DIAGNOSIS — N2589 Other disorders resulting from impaired renal tubular function: Secondary | ICD-10-CM | POA: Diagnosis not present

## 2018-11-14 DIAGNOSIS — N2581 Secondary hyperparathyroidism of renal origin: Secondary | ICD-10-CM | POA: Diagnosis not present

## 2018-11-14 DIAGNOSIS — K769 Liver disease, unspecified: Secondary | ICD-10-CM | POA: Diagnosis not present

## 2018-11-14 DIAGNOSIS — N186 End stage renal disease: Secondary | ICD-10-CM | POA: Diagnosis not present

## 2018-11-14 DIAGNOSIS — D509 Iron deficiency anemia, unspecified: Secondary | ICD-10-CM | POA: Diagnosis not present

## 2018-11-14 DIAGNOSIS — Z4932 Encounter for adequacy testing for peritoneal dialysis: Secondary | ICD-10-CM | POA: Diagnosis not present

## 2018-11-15 DIAGNOSIS — N2589 Other disorders resulting from impaired renal tubular function: Secondary | ICD-10-CM | POA: Diagnosis not present

## 2018-11-15 DIAGNOSIS — N186 End stage renal disease: Secondary | ICD-10-CM | POA: Diagnosis not present

## 2018-11-15 DIAGNOSIS — K769 Liver disease, unspecified: Secondary | ICD-10-CM | POA: Diagnosis not present

## 2018-11-15 DIAGNOSIS — N2581 Secondary hyperparathyroidism of renal origin: Secondary | ICD-10-CM | POA: Diagnosis not present

## 2018-11-15 DIAGNOSIS — Z4932 Encounter for adequacy testing for peritoneal dialysis: Secondary | ICD-10-CM | POA: Diagnosis not present

## 2018-11-15 DIAGNOSIS — D509 Iron deficiency anemia, unspecified: Secondary | ICD-10-CM | POA: Diagnosis not present

## 2018-11-16 DIAGNOSIS — D509 Iron deficiency anemia, unspecified: Secondary | ICD-10-CM | POA: Diagnosis not present

## 2018-11-16 DIAGNOSIS — N2581 Secondary hyperparathyroidism of renal origin: Secondary | ICD-10-CM | POA: Diagnosis not present

## 2018-11-16 DIAGNOSIS — N186 End stage renal disease: Secondary | ICD-10-CM | POA: Diagnosis not present

## 2018-11-16 DIAGNOSIS — Z4932 Encounter for adequacy testing for peritoneal dialysis: Secondary | ICD-10-CM | POA: Diagnosis not present

## 2018-11-16 DIAGNOSIS — N2589 Other disorders resulting from impaired renal tubular function: Secondary | ICD-10-CM | POA: Diagnosis not present

## 2018-11-16 DIAGNOSIS — K769 Liver disease, unspecified: Secondary | ICD-10-CM | POA: Diagnosis not present

## 2018-11-17 DIAGNOSIS — N2589 Other disorders resulting from impaired renal tubular function: Secondary | ICD-10-CM | POA: Diagnosis not present

## 2018-11-17 DIAGNOSIS — K769 Liver disease, unspecified: Secondary | ICD-10-CM | POA: Diagnosis not present

## 2018-11-17 DIAGNOSIS — D509 Iron deficiency anemia, unspecified: Secondary | ICD-10-CM | POA: Diagnosis not present

## 2018-11-17 DIAGNOSIS — N186 End stage renal disease: Secondary | ICD-10-CM | POA: Diagnosis not present

## 2018-11-17 DIAGNOSIS — N2581 Secondary hyperparathyroidism of renal origin: Secondary | ICD-10-CM | POA: Diagnosis not present

## 2018-11-17 DIAGNOSIS — Z4932 Encounter for adequacy testing for peritoneal dialysis: Secondary | ICD-10-CM | POA: Diagnosis not present

## 2018-11-18 DIAGNOSIS — N186 End stage renal disease: Secondary | ICD-10-CM | POA: Diagnosis not present

## 2018-11-18 DIAGNOSIS — K769 Liver disease, unspecified: Secondary | ICD-10-CM | POA: Diagnosis not present

## 2018-11-18 DIAGNOSIS — D509 Iron deficiency anemia, unspecified: Secondary | ICD-10-CM | POA: Diagnosis not present

## 2018-11-18 DIAGNOSIS — N2589 Other disorders resulting from impaired renal tubular function: Secondary | ICD-10-CM | POA: Diagnosis not present

## 2018-11-18 DIAGNOSIS — N2581 Secondary hyperparathyroidism of renal origin: Secondary | ICD-10-CM | POA: Diagnosis not present

## 2018-11-18 DIAGNOSIS — Z4932 Encounter for adequacy testing for peritoneal dialysis: Secondary | ICD-10-CM | POA: Diagnosis not present

## 2018-11-19 DIAGNOSIS — N186 End stage renal disease: Secondary | ICD-10-CM | POA: Diagnosis not present

## 2018-11-19 DIAGNOSIS — Z4932 Encounter for adequacy testing for peritoneal dialysis: Secondary | ICD-10-CM | POA: Diagnosis not present

## 2018-11-19 DIAGNOSIS — K769 Liver disease, unspecified: Secondary | ICD-10-CM | POA: Diagnosis not present

## 2018-11-19 DIAGNOSIS — N2589 Other disorders resulting from impaired renal tubular function: Secondary | ICD-10-CM | POA: Diagnosis not present

## 2018-11-19 DIAGNOSIS — N2581 Secondary hyperparathyroidism of renal origin: Secondary | ICD-10-CM | POA: Diagnosis not present

## 2018-11-19 DIAGNOSIS — D509 Iron deficiency anemia, unspecified: Secondary | ICD-10-CM | POA: Diagnosis not present

## 2018-11-20 DIAGNOSIS — N186 End stage renal disease: Secondary | ICD-10-CM | POA: Diagnosis not present

## 2018-11-20 DIAGNOSIS — N2581 Secondary hyperparathyroidism of renal origin: Secondary | ICD-10-CM | POA: Diagnosis not present

## 2018-11-20 DIAGNOSIS — Z4932 Encounter for adequacy testing for peritoneal dialysis: Secondary | ICD-10-CM | POA: Diagnosis not present

## 2018-11-20 DIAGNOSIS — K769 Liver disease, unspecified: Secondary | ICD-10-CM | POA: Diagnosis not present

## 2018-11-20 DIAGNOSIS — D509 Iron deficiency anemia, unspecified: Secondary | ICD-10-CM | POA: Diagnosis not present

## 2018-11-20 DIAGNOSIS — N2589 Other disorders resulting from impaired renal tubular function: Secondary | ICD-10-CM | POA: Diagnosis not present

## 2018-11-21 DIAGNOSIS — Z4932 Encounter for adequacy testing for peritoneal dialysis: Secondary | ICD-10-CM | POA: Diagnosis not present

## 2018-11-21 DIAGNOSIS — N2581 Secondary hyperparathyroidism of renal origin: Secondary | ICD-10-CM | POA: Diagnosis not present

## 2018-11-21 DIAGNOSIS — N2589 Other disorders resulting from impaired renal tubular function: Secondary | ICD-10-CM | POA: Diagnosis not present

## 2018-11-21 DIAGNOSIS — K769 Liver disease, unspecified: Secondary | ICD-10-CM | POA: Diagnosis not present

## 2018-11-21 DIAGNOSIS — N186 End stage renal disease: Secondary | ICD-10-CM | POA: Diagnosis not present

## 2018-11-21 DIAGNOSIS — D509 Iron deficiency anemia, unspecified: Secondary | ICD-10-CM | POA: Diagnosis not present

## 2018-11-22 DIAGNOSIS — N2589 Other disorders resulting from impaired renal tubular function: Secondary | ICD-10-CM | POA: Diagnosis not present

## 2018-11-22 DIAGNOSIS — N186 End stage renal disease: Secondary | ICD-10-CM | POA: Diagnosis not present

## 2018-11-22 DIAGNOSIS — N2581 Secondary hyperparathyroidism of renal origin: Secondary | ICD-10-CM | POA: Diagnosis not present

## 2018-11-22 DIAGNOSIS — K769 Liver disease, unspecified: Secondary | ICD-10-CM | POA: Diagnosis not present

## 2018-11-22 DIAGNOSIS — D509 Iron deficiency anemia, unspecified: Secondary | ICD-10-CM | POA: Diagnosis not present

## 2018-11-22 DIAGNOSIS — Z4932 Encounter for adequacy testing for peritoneal dialysis: Secondary | ICD-10-CM | POA: Diagnosis not present

## 2018-11-23 DIAGNOSIS — D509 Iron deficiency anemia, unspecified: Secondary | ICD-10-CM | POA: Diagnosis not present

## 2018-11-23 DIAGNOSIS — K769 Liver disease, unspecified: Secondary | ICD-10-CM | POA: Diagnosis not present

## 2018-11-23 DIAGNOSIS — N2581 Secondary hyperparathyroidism of renal origin: Secondary | ICD-10-CM | POA: Diagnosis not present

## 2018-11-23 DIAGNOSIS — N2589 Other disorders resulting from impaired renal tubular function: Secondary | ICD-10-CM | POA: Diagnosis not present

## 2018-11-23 DIAGNOSIS — Z4932 Encounter for adequacy testing for peritoneal dialysis: Secondary | ICD-10-CM | POA: Diagnosis not present

## 2018-11-23 DIAGNOSIS — N186 End stage renal disease: Secondary | ICD-10-CM | POA: Diagnosis not present

## 2018-11-24 DIAGNOSIS — K769 Liver disease, unspecified: Secondary | ICD-10-CM | POA: Diagnosis not present

## 2018-11-24 DIAGNOSIS — N2581 Secondary hyperparathyroidism of renal origin: Secondary | ICD-10-CM | POA: Diagnosis not present

## 2018-11-24 DIAGNOSIS — Z4932 Encounter for adequacy testing for peritoneal dialysis: Secondary | ICD-10-CM | POA: Diagnosis not present

## 2018-11-24 DIAGNOSIS — N186 End stage renal disease: Secondary | ICD-10-CM | POA: Diagnosis not present

## 2018-11-24 DIAGNOSIS — N2589 Other disorders resulting from impaired renal tubular function: Secondary | ICD-10-CM | POA: Diagnosis not present

## 2018-11-24 DIAGNOSIS — D509 Iron deficiency anemia, unspecified: Secondary | ICD-10-CM | POA: Diagnosis not present

## 2018-11-25 DIAGNOSIS — N2581 Secondary hyperparathyroidism of renal origin: Secondary | ICD-10-CM | POA: Diagnosis not present

## 2018-11-25 DIAGNOSIS — K769 Liver disease, unspecified: Secondary | ICD-10-CM | POA: Diagnosis not present

## 2018-11-25 DIAGNOSIS — Z4932 Encounter for adequacy testing for peritoneal dialysis: Secondary | ICD-10-CM | POA: Diagnosis not present

## 2018-11-25 DIAGNOSIS — N186 End stage renal disease: Secondary | ICD-10-CM | POA: Diagnosis not present

## 2018-11-25 DIAGNOSIS — N2589 Other disorders resulting from impaired renal tubular function: Secondary | ICD-10-CM | POA: Diagnosis not present

## 2018-11-25 DIAGNOSIS — D509 Iron deficiency anemia, unspecified: Secondary | ICD-10-CM | POA: Diagnosis not present

## 2018-11-26 DIAGNOSIS — K769 Liver disease, unspecified: Secondary | ICD-10-CM | POA: Diagnosis not present

## 2018-11-26 DIAGNOSIS — Z4932 Encounter for adequacy testing for peritoneal dialysis: Secondary | ICD-10-CM | POA: Diagnosis not present

## 2018-11-26 DIAGNOSIS — N2589 Other disorders resulting from impaired renal tubular function: Secondary | ICD-10-CM | POA: Diagnosis not present

## 2018-11-26 DIAGNOSIS — D509 Iron deficiency anemia, unspecified: Secondary | ICD-10-CM | POA: Diagnosis not present

## 2018-11-26 DIAGNOSIS — N186 End stage renal disease: Secondary | ICD-10-CM | POA: Diagnosis not present

## 2018-11-26 DIAGNOSIS — N2581 Secondary hyperparathyroidism of renal origin: Secondary | ICD-10-CM | POA: Diagnosis not present

## 2018-11-27 DIAGNOSIS — Z4932 Encounter for adequacy testing for peritoneal dialysis: Secondary | ICD-10-CM | POA: Diagnosis not present

## 2018-11-27 DIAGNOSIS — N186 End stage renal disease: Secondary | ICD-10-CM | POA: Diagnosis not present

## 2018-11-27 DIAGNOSIS — N2581 Secondary hyperparathyroidism of renal origin: Secondary | ICD-10-CM | POA: Diagnosis not present

## 2018-11-27 DIAGNOSIS — D509 Iron deficiency anemia, unspecified: Secondary | ICD-10-CM | POA: Diagnosis not present

## 2018-11-27 DIAGNOSIS — K769 Liver disease, unspecified: Secondary | ICD-10-CM | POA: Diagnosis not present

## 2018-11-27 DIAGNOSIS — N2589 Other disorders resulting from impaired renal tubular function: Secondary | ICD-10-CM | POA: Diagnosis not present

## 2018-11-28 DIAGNOSIS — Z4932 Encounter for adequacy testing for peritoneal dialysis: Secondary | ICD-10-CM | POA: Diagnosis not present

## 2018-11-28 DIAGNOSIS — D509 Iron deficiency anemia, unspecified: Secondary | ICD-10-CM | POA: Diagnosis not present

## 2018-11-28 DIAGNOSIS — N2589 Other disorders resulting from impaired renal tubular function: Secondary | ICD-10-CM | POA: Diagnosis not present

## 2018-11-28 DIAGNOSIS — N186 End stage renal disease: Secondary | ICD-10-CM | POA: Diagnosis not present

## 2018-11-28 DIAGNOSIS — N2581 Secondary hyperparathyroidism of renal origin: Secondary | ICD-10-CM | POA: Diagnosis not present

## 2018-11-28 DIAGNOSIS — K769 Liver disease, unspecified: Secondary | ICD-10-CM | POA: Diagnosis not present

## 2018-11-29 DIAGNOSIS — Z4932 Encounter for adequacy testing for peritoneal dialysis: Secondary | ICD-10-CM | POA: Diagnosis not present

## 2018-11-29 DIAGNOSIS — N186 End stage renal disease: Secondary | ICD-10-CM | POA: Diagnosis not present

## 2018-11-29 DIAGNOSIS — N2581 Secondary hyperparathyroidism of renal origin: Secondary | ICD-10-CM | POA: Diagnosis not present

## 2018-11-29 DIAGNOSIS — N2589 Other disorders resulting from impaired renal tubular function: Secondary | ICD-10-CM | POA: Diagnosis not present

## 2018-11-29 DIAGNOSIS — K769 Liver disease, unspecified: Secondary | ICD-10-CM | POA: Diagnosis not present

## 2018-11-29 DIAGNOSIS — D509 Iron deficiency anemia, unspecified: Secondary | ICD-10-CM | POA: Diagnosis not present

## 2018-11-30 DIAGNOSIS — D509 Iron deficiency anemia, unspecified: Secondary | ICD-10-CM | POA: Diagnosis not present

## 2018-11-30 DIAGNOSIS — K769 Liver disease, unspecified: Secondary | ICD-10-CM | POA: Diagnosis not present

## 2018-11-30 DIAGNOSIS — N2581 Secondary hyperparathyroidism of renal origin: Secondary | ICD-10-CM | POA: Diagnosis not present

## 2018-11-30 DIAGNOSIS — Z4932 Encounter for adequacy testing for peritoneal dialysis: Secondary | ICD-10-CM | POA: Diagnosis not present

## 2018-11-30 DIAGNOSIS — N186 End stage renal disease: Secondary | ICD-10-CM | POA: Diagnosis not present

## 2018-11-30 DIAGNOSIS — N2589 Other disorders resulting from impaired renal tubular function: Secondary | ICD-10-CM | POA: Diagnosis not present

## 2018-12-01 DIAGNOSIS — N2589 Other disorders resulting from impaired renal tubular function: Secondary | ICD-10-CM | POA: Diagnosis not present

## 2018-12-01 DIAGNOSIS — Z4932 Encounter for adequacy testing for peritoneal dialysis: Secondary | ICD-10-CM | POA: Diagnosis not present

## 2018-12-01 DIAGNOSIS — K769 Liver disease, unspecified: Secondary | ICD-10-CM | POA: Diagnosis not present

## 2018-12-01 DIAGNOSIS — N186 End stage renal disease: Secondary | ICD-10-CM | POA: Diagnosis not present

## 2018-12-01 DIAGNOSIS — N2581 Secondary hyperparathyroidism of renal origin: Secondary | ICD-10-CM | POA: Diagnosis not present

## 2018-12-01 DIAGNOSIS — D509 Iron deficiency anemia, unspecified: Secondary | ICD-10-CM | POA: Diagnosis not present

## 2018-12-02 DIAGNOSIS — D509 Iron deficiency anemia, unspecified: Secondary | ICD-10-CM | POA: Diagnosis not present

## 2018-12-02 DIAGNOSIS — K769 Liver disease, unspecified: Secondary | ICD-10-CM | POA: Diagnosis not present

## 2018-12-02 DIAGNOSIS — N2589 Other disorders resulting from impaired renal tubular function: Secondary | ICD-10-CM | POA: Diagnosis not present

## 2018-12-02 DIAGNOSIS — Z4932 Encounter for adequacy testing for peritoneal dialysis: Secondary | ICD-10-CM | POA: Diagnosis not present

## 2018-12-02 DIAGNOSIS — N2581 Secondary hyperparathyroidism of renal origin: Secondary | ICD-10-CM | POA: Diagnosis not present

## 2018-12-02 DIAGNOSIS — N186 End stage renal disease: Secondary | ICD-10-CM | POA: Diagnosis not present

## 2018-12-03 DIAGNOSIS — D631 Anemia in chronic kidney disease: Secondary | ICD-10-CM | POA: Diagnosis not present

## 2018-12-03 DIAGNOSIS — N186 End stage renal disease: Secondary | ICD-10-CM | POA: Diagnosis not present

## 2018-12-03 DIAGNOSIS — E440 Moderate protein-calorie malnutrition: Secondary | ICD-10-CM | POA: Diagnosis not present

## 2018-12-03 DIAGNOSIS — I129 Hypertensive chronic kidney disease with stage 1 through stage 4 chronic kidney disease, or unspecified chronic kidney disease: Secondary | ICD-10-CM | POA: Diagnosis not present

## 2018-12-03 DIAGNOSIS — Z992 Dependence on renal dialysis: Secondary | ICD-10-CM | POA: Diagnosis not present

## 2018-12-03 DIAGNOSIS — R17 Unspecified jaundice: Secondary | ICD-10-CM | POA: Diagnosis not present

## 2018-12-03 DIAGNOSIS — D509 Iron deficiency anemia, unspecified: Secondary | ICD-10-CM | POA: Diagnosis not present

## 2018-12-03 DIAGNOSIS — N2581 Secondary hyperparathyroidism of renal origin: Secondary | ICD-10-CM | POA: Diagnosis not present

## 2018-12-03 DIAGNOSIS — K769 Liver disease, unspecified: Secondary | ICD-10-CM | POA: Diagnosis not present

## 2018-12-03 DIAGNOSIS — Z79899 Other long term (current) drug therapy: Secondary | ICD-10-CM | POA: Diagnosis not present

## 2018-12-04 DIAGNOSIS — N2581 Secondary hyperparathyroidism of renal origin: Secondary | ICD-10-CM | POA: Diagnosis not present

## 2018-12-04 DIAGNOSIS — D631 Anemia in chronic kidney disease: Secondary | ICD-10-CM | POA: Diagnosis not present

## 2018-12-04 DIAGNOSIS — Z79899 Other long term (current) drug therapy: Secondary | ICD-10-CM | POA: Diagnosis not present

## 2018-12-04 DIAGNOSIS — K769 Liver disease, unspecified: Secondary | ICD-10-CM | POA: Diagnosis not present

## 2018-12-04 DIAGNOSIS — N186 End stage renal disease: Secondary | ICD-10-CM | POA: Diagnosis not present

## 2018-12-04 DIAGNOSIS — R17 Unspecified jaundice: Secondary | ICD-10-CM | POA: Diagnosis not present

## 2018-12-05 DIAGNOSIS — R17 Unspecified jaundice: Secondary | ICD-10-CM | POA: Diagnosis not present

## 2018-12-05 DIAGNOSIS — N186 End stage renal disease: Secondary | ICD-10-CM | POA: Diagnosis not present

## 2018-12-05 DIAGNOSIS — D631 Anemia in chronic kidney disease: Secondary | ICD-10-CM | POA: Diagnosis not present

## 2018-12-05 DIAGNOSIS — Z79899 Other long term (current) drug therapy: Secondary | ICD-10-CM | POA: Diagnosis not present

## 2018-12-05 DIAGNOSIS — K769 Liver disease, unspecified: Secondary | ICD-10-CM | POA: Diagnosis not present

## 2018-12-05 DIAGNOSIS — N2581 Secondary hyperparathyroidism of renal origin: Secondary | ICD-10-CM | POA: Diagnosis not present

## 2018-12-06 DIAGNOSIS — N2581 Secondary hyperparathyroidism of renal origin: Secondary | ICD-10-CM | POA: Diagnosis not present

## 2018-12-06 DIAGNOSIS — D631 Anemia in chronic kidney disease: Secondary | ICD-10-CM | POA: Diagnosis not present

## 2018-12-06 DIAGNOSIS — K769 Liver disease, unspecified: Secondary | ICD-10-CM | POA: Diagnosis not present

## 2018-12-06 DIAGNOSIS — R17 Unspecified jaundice: Secondary | ICD-10-CM | POA: Diagnosis not present

## 2018-12-06 DIAGNOSIS — N186 End stage renal disease: Secondary | ICD-10-CM | POA: Diagnosis not present

## 2018-12-06 DIAGNOSIS — Z79899 Other long term (current) drug therapy: Secondary | ICD-10-CM | POA: Diagnosis not present

## 2018-12-07 DIAGNOSIS — N186 End stage renal disease: Secondary | ICD-10-CM | POA: Diagnosis not present

## 2018-12-07 DIAGNOSIS — N2581 Secondary hyperparathyroidism of renal origin: Secondary | ICD-10-CM | POA: Diagnosis not present

## 2018-12-07 DIAGNOSIS — D631 Anemia in chronic kidney disease: Secondary | ICD-10-CM | POA: Diagnosis not present

## 2018-12-07 DIAGNOSIS — Z79899 Other long term (current) drug therapy: Secondary | ICD-10-CM | POA: Diagnosis not present

## 2018-12-07 DIAGNOSIS — K769 Liver disease, unspecified: Secondary | ICD-10-CM | POA: Diagnosis not present

## 2018-12-07 DIAGNOSIS — R17 Unspecified jaundice: Secondary | ICD-10-CM | POA: Diagnosis not present

## 2018-12-08 DIAGNOSIS — N186 End stage renal disease: Secondary | ICD-10-CM | POA: Diagnosis not present

## 2018-12-08 DIAGNOSIS — N2581 Secondary hyperparathyroidism of renal origin: Secondary | ICD-10-CM | POA: Diagnosis not present

## 2018-12-08 DIAGNOSIS — R82998 Other abnormal findings in urine: Secondary | ICD-10-CM | POA: Diagnosis not present

## 2018-12-08 DIAGNOSIS — D631 Anemia in chronic kidney disease: Secondary | ICD-10-CM | POA: Diagnosis not present

## 2018-12-08 DIAGNOSIS — K769 Liver disease, unspecified: Secondary | ICD-10-CM | POA: Diagnosis not present

## 2018-12-08 DIAGNOSIS — R17 Unspecified jaundice: Secondary | ICD-10-CM | POA: Diagnosis not present

## 2018-12-08 DIAGNOSIS — Z79899 Other long term (current) drug therapy: Secondary | ICD-10-CM | POA: Diagnosis not present

## 2018-12-09 DIAGNOSIS — N2581 Secondary hyperparathyroidism of renal origin: Secondary | ICD-10-CM | POA: Diagnosis not present

## 2018-12-09 DIAGNOSIS — D631 Anemia in chronic kidney disease: Secondary | ICD-10-CM | POA: Diagnosis not present

## 2018-12-09 DIAGNOSIS — K769 Liver disease, unspecified: Secondary | ICD-10-CM | POA: Diagnosis not present

## 2018-12-09 DIAGNOSIS — N186 End stage renal disease: Secondary | ICD-10-CM | POA: Diagnosis not present

## 2018-12-09 DIAGNOSIS — Z79899 Other long term (current) drug therapy: Secondary | ICD-10-CM | POA: Diagnosis not present

## 2018-12-09 DIAGNOSIS — R17 Unspecified jaundice: Secondary | ICD-10-CM | POA: Diagnosis not present

## 2018-12-10 DIAGNOSIS — D631 Anemia in chronic kidney disease: Secondary | ICD-10-CM | POA: Diagnosis not present

## 2018-12-10 DIAGNOSIS — N186 End stage renal disease: Secondary | ICD-10-CM | POA: Diagnosis not present

## 2018-12-10 DIAGNOSIS — Z79899 Other long term (current) drug therapy: Secondary | ICD-10-CM | POA: Diagnosis not present

## 2018-12-10 DIAGNOSIS — N2581 Secondary hyperparathyroidism of renal origin: Secondary | ICD-10-CM | POA: Diagnosis not present

## 2018-12-10 DIAGNOSIS — R17 Unspecified jaundice: Secondary | ICD-10-CM | POA: Diagnosis not present

## 2018-12-10 DIAGNOSIS — K769 Liver disease, unspecified: Secondary | ICD-10-CM | POA: Diagnosis not present

## 2018-12-11 DIAGNOSIS — R17 Unspecified jaundice: Secondary | ICD-10-CM | POA: Diagnosis not present

## 2018-12-11 DIAGNOSIS — Z79899 Other long term (current) drug therapy: Secondary | ICD-10-CM | POA: Diagnosis not present

## 2018-12-11 DIAGNOSIS — D631 Anemia in chronic kidney disease: Secondary | ICD-10-CM | POA: Diagnosis not present

## 2018-12-11 DIAGNOSIS — N186 End stage renal disease: Secondary | ICD-10-CM | POA: Diagnosis not present

## 2018-12-11 DIAGNOSIS — N2581 Secondary hyperparathyroidism of renal origin: Secondary | ICD-10-CM | POA: Diagnosis not present

## 2018-12-11 DIAGNOSIS — K769 Liver disease, unspecified: Secondary | ICD-10-CM | POA: Diagnosis not present

## 2018-12-12 DIAGNOSIS — K769 Liver disease, unspecified: Secondary | ICD-10-CM | POA: Diagnosis not present

## 2018-12-12 DIAGNOSIS — Z79899 Other long term (current) drug therapy: Secondary | ICD-10-CM | POA: Diagnosis not present

## 2018-12-12 DIAGNOSIS — N186 End stage renal disease: Secondary | ICD-10-CM | POA: Diagnosis not present

## 2018-12-12 DIAGNOSIS — N2581 Secondary hyperparathyroidism of renal origin: Secondary | ICD-10-CM | POA: Diagnosis not present

## 2018-12-12 DIAGNOSIS — D631 Anemia in chronic kidney disease: Secondary | ICD-10-CM | POA: Diagnosis not present

## 2018-12-12 DIAGNOSIS — R17 Unspecified jaundice: Secondary | ICD-10-CM | POA: Diagnosis not present

## 2018-12-13 DIAGNOSIS — R17 Unspecified jaundice: Secondary | ICD-10-CM | POA: Diagnosis not present

## 2018-12-13 DIAGNOSIS — Z79899 Other long term (current) drug therapy: Secondary | ICD-10-CM | POA: Diagnosis not present

## 2018-12-13 DIAGNOSIS — K769 Liver disease, unspecified: Secondary | ICD-10-CM | POA: Diagnosis not present

## 2018-12-13 DIAGNOSIS — D631 Anemia in chronic kidney disease: Secondary | ICD-10-CM | POA: Diagnosis not present

## 2018-12-13 DIAGNOSIS — N2581 Secondary hyperparathyroidism of renal origin: Secondary | ICD-10-CM | POA: Diagnosis not present

## 2018-12-13 DIAGNOSIS — N186 End stage renal disease: Secondary | ICD-10-CM | POA: Diagnosis not present

## 2018-12-14 DIAGNOSIS — R17 Unspecified jaundice: Secondary | ICD-10-CM | POA: Diagnosis not present

## 2018-12-14 DIAGNOSIS — K769 Liver disease, unspecified: Secondary | ICD-10-CM | POA: Diagnosis not present

## 2018-12-14 DIAGNOSIS — Z79899 Other long term (current) drug therapy: Secondary | ICD-10-CM | POA: Diagnosis not present

## 2018-12-14 DIAGNOSIS — N2581 Secondary hyperparathyroidism of renal origin: Secondary | ICD-10-CM | POA: Diagnosis not present

## 2018-12-14 DIAGNOSIS — N186 End stage renal disease: Secondary | ICD-10-CM | POA: Diagnosis not present

## 2018-12-14 DIAGNOSIS — D631 Anemia in chronic kidney disease: Secondary | ICD-10-CM | POA: Diagnosis not present

## 2018-12-15 DIAGNOSIS — N186 End stage renal disease: Secondary | ICD-10-CM | POA: Diagnosis not present

## 2018-12-15 DIAGNOSIS — D631 Anemia in chronic kidney disease: Secondary | ICD-10-CM | POA: Diagnosis not present

## 2018-12-15 DIAGNOSIS — K769 Liver disease, unspecified: Secondary | ICD-10-CM | POA: Diagnosis not present

## 2018-12-15 DIAGNOSIS — R17 Unspecified jaundice: Secondary | ICD-10-CM | POA: Diagnosis not present

## 2018-12-15 DIAGNOSIS — Z79899 Other long term (current) drug therapy: Secondary | ICD-10-CM | POA: Diagnosis not present

## 2018-12-15 DIAGNOSIS — N2581 Secondary hyperparathyroidism of renal origin: Secondary | ICD-10-CM | POA: Diagnosis not present

## 2018-12-16 ENCOUNTER — Ambulatory Visit (INDEPENDENT_AMBULATORY_CARE_PROVIDER_SITE_OTHER): Payer: Medicare Other | Admitting: Nurse Practitioner

## 2018-12-16 ENCOUNTER — Ambulatory Visit (INDEPENDENT_AMBULATORY_CARE_PROVIDER_SITE_OTHER): Payer: Medicare Other

## 2018-12-16 ENCOUNTER — Encounter (INDEPENDENT_AMBULATORY_CARE_PROVIDER_SITE_OTHER): Payer: Self-pay | Admitting: Nurse Practitioner

## 2018-12-16 ENCOUNTER — Other Ambulatory Visit: Payer: Self-pay

## 2018-12-16 VITALS — BP 153/96 | HR 92 | Resp 16 | Wt 189.4 lb

## 2018-12-16 DIAGNOSIS — I10 Essential (primary) hypertension: Secondary | ICD-10-CM

## 2018-12-16 DIAGNOSIS — N2581 Secondary hyperparathyroidism of renal origin: Secondary | ICD-10-CM | POA: Diagnosis not present

## 2018-12-16 DIAGNOSIS — D631 Anemia in chronic kidney disease: Secondary | ICD-10-CM | POA: Diagnosis not present

## 2018-12-16 DIAGNOSIS — K769 Liver disease, unspecified: Secondary | ICD-10-CM | POA: Diagnosis not present

## 2018-12-16 DIAGNOSIS — N186 End stage renal disease: Secondary | ICD-10-CM

## 2018-12-16 DIAGNOSIS — I120 Hypertensive chronic kidney disease with stage 5 chronic kidney disease or end stage renal disease: Secondary | ICD-10-CM

## 2018-12-16 DIAGNOSIS — Z992 Dependence on renal dialysis: Secondary | ICD-10-CM | POA: Diagnosis not present

## 2018-12-16 DIAGNOSIS — Z720 Tobacco use: Secondary | ICD-10-CM | POA: Diagnosis not present

## 2018-12-16 DIAGNOSIS — Z79899 Other long term (current) drug therapy: Secondary | ICD-10-CM | POA: Diagnosis not present

## 2018-12-16 DIAGNOSIS — R17 Unspecified jaundice: Secondary | ICD-10-CM | POA: Diagnosis not present

## 2018-12-16 NOTE — Progress Notes (Signed)
SUBJECTIVE:  Patient ID: Ruth Gutierrez, female    DOB: [birth year], 53 y.o.   MRN: [id number] Chief Complaint  Patient presents with  . Follow-up    73month ultrasound     HPI  Ruth Gutierrez is a 53 y.o. female The patient returns to the office for followup of their dialysis access. The function of the access has been stable. The patient denies increased bleeding time or increased recirculation. Patient denies difficulty with cannulation. The patient denies hand pain or other symptoms consistent with steal phenomena.  No significant arm swelling.  The patient denies redness or swelling at the access site. The patient denies fever or chills at home or while on dialysis.  The patient denies amaurosis fugax or recent TIA symptoms. There are no recent neurological changes noted. The patient denies claudication symptoms or rest pain symptoms. The patient denies history of DVT, PE or superficial thrombophlebitis. The patient denies recent episodes of angina or shortness of breath.   Patient also states that she has recently transitioned to home hemodialysis utilizing a PermCath.  She states that she is no longer utilizing her fistula however has been told to keep close eye on it.  The patient is aware of how to feel for a thrill.  Today that he patient underwent noninvasive studies which revealed a flow volume of 2224.  The left branch basilic trans-AV fistula has a mild velocity increase at the antecubital fossa region as seen previously.  There is mild aneurysmal changes in the mid to distal upper arm region.  The stent placed at the confluence and central venous system is patent.     Past Medical History:  Diagnosis Date  . Anemia of chronic disease   . Arthritis   . Deceased-donor kidney transplant    Performed at [facility], April 2010.  Initial ESRD due to HTN nephropathy  . Eczema   . ESRD (end stage renal disease) ([employer])    s/p transplant creatinine baseline 1.1   M/W/F dialysis  . FUO (fever of unknown origin) 05/17/2015  . GERD (gastroesophageal reflux disease)   . Headache(784.0)   . History of hyperparathyroidism   . Hypertension   . Shortness of breath   . Wears glasses     Past Surgical History:  Procedure Laterality Date  . A/V FISTULAGRAM Left 02/12/2017   Procedure: A/V Fistulagram;  Surgeon: Algernon Huxley, MD;  Location: [place] [facility];  Service: Cardiovascular;  Laterality: Left;  . A/V FISTULAGRAM Left 12/30/2017   Procedure: A/V FISTULAGRAM;  Surgeon: Algernon Huxley, MD;  Location: [place] [facility];  Service: Cardiovascular;  Laterality: Left;  . A/V SHUNT INTERVENTION N/A 02/12/2017   Procedure: A/V Shunt Intervention;  Surgeon: Algernon Huxley, MD;  Location: [place] [facility];  Service: Cardiovascular;  Laterality: N/A;  . AV FISTULA PLACEMENT    . BASCILIC VEIN TRANSPOSITION Left 10/30/2014   Procedure: LEFT BASCILIC VEIN TRANSPOSITION;  Surgeon: Rosetta Posner, MD;  Location: [place];  Service: Vascular;  Laterality: Left;  . BASCILIC VEIN TRANSPOSITION Left 01/03/2015   Procedure: LEFT ARM 2ND STAGE BASCILIC VEIN TRANSPOSITION;  Surgeon: Rosetta Posner, MD;  Location: [place];  Service: Vascular;  Laterality: Left;  . FRACTURE SURGERY     left foot,baby toe nad next toe missing  . INSERTION OF DIALYSIS CATHETER Right 10/30/2014   Procedure: INSERTION OF DIALYSIS CATHETER;  Surgeon: Rosetta Posner, MD;  Location: [place];  Service: Vascular;  Laterality:  Right;  Marland Kitchen KIDNEY TRANSPLANT  11/2008   [facility])  . WISDOM TOOTH EXTRACTION      Social History   Socioeconomic History  . Marital status: Single    Spouse name: Not on file  . Number of children: Not on file  . Years of education: Not on file  . Highest education level: Not on file  Occupational History  . Not on file  Social Needs  . Financial resource strain: Not on file  . Food insecurity:    Worry: Not on file    Inability: Not on file  . Transportation needs:     Medical: Not on file    Non-medical: Not on file  Tobacco Use  . Smoking status: Current Every Day Smoker    Packs/day: 0.50    Years: 27.00    Pack years: 13.50    Types: Cigarettes  . Smokeless tobacco: Never Used  . Tobacco comment: 10 cigarettes a day  Substance and Sexual Activity  . Alcohol use: No    Alcohol/week: 0.0 standard drinks    Comment: occasional drinker noted in the past  . Drug use: No  . Sexual activity: Yes    Partners: Male    Birth control/protection: None  Lifestyle  . Physical activity:    Days per week: Not on file    Minutes per session: Not on file  . Stress: Not on file  Relationships  . Social connections:    Talks on phone: Not on file    Gets together: Not on file    Attends religious service: Not on file    Active member of club or organization: Not on file    Attends meetings of clubs or organizations: Not on file    Relationship status: Not on file  . Intimate partner violence:    Fear of current or ex partner: Not on file    Emotionally abused: Not on file    Physically abused: Not on file    Forced sexual activity: Not on file  Other Topics Concern  . Not on file  Social History Narrative   Lives with daughter (born 25), sister helps her with medications etc.    Family History  Problem Relation Age of Onset  . Hypertension Mother   . Hypertension Father   . Diabetes Brother   . Deep vein thrombosis Brother   . Hypertension Sister   . Hyperlipidemia Sister   . Kidney disease Brother        on HD    Allergies  Allergen Reactions  . Penicillin G Itching and Rash  . Penicillins Itching and Rash     Review of Systems   Review of Systems: Negative Unless Checked Constitutional: [] Weight loss  [] Fever  [] Chills Cardiac: [] Chest pain   []  Atrial Fibrillation  [] Palpitations   [] Shortness of breath when laying flat   [] Shortness of breath with exertion. [] Shortness of breath at rest Vascular:  [] Pain in legs with  walking   [] Pain in legs with standing [] Pain in legs when laying flat   [] Claudication    [] Pain in feet when laying flat    [] History of DVT   [] Phlebitis   [] Swelling in legs   [] Varicose veins   [] Non-healing ulcers Pulmonary:   [] Uses home oxygen   [] Productive cough   [] Hemoptysis   [] Wheeze  [] COPD   [] Asthma Neurologic:  [] Dizziness   [] Seizures  [] Blackouts [] History of stroke   [] History of TIA  [] Aphasia   []   Temporary Blindness   [] Weakness or numbness in arm   [] Weakness or numbness in leg Musculoskeletal:   [] Joint swelling   [] Joint pain   [] Low back pain  []  History of Knee Replacement [] Arthritis [] back Surgeries  []  Spinal Stenosis    Hematologic:  [] Easy bruising  [] Easy bleeding   [] Hypercoagulable state   [x] Anemic Gastrointestinal:  [] Diarrhea   [] Vomiting  [] Gastroesophageal reflux/heartburn   [] Difficulty swallowing. [] Abdominal pain Genitourinary:  [x] Chronic kidney disease   [] Difficult urination  [] Anuric   [] Blood in urine [] Frequent urination  [] Burning with urination   [] Hematuria Skin:  [] Rashes   [] Ulcers [] Wounds Psychological:  [] History of anxiety   []  History of major depression  []  Memory Difficulties      OBJECTIVE:   Physical Exam  BP (!) 153/96 (BP Location: Right Arm)   Pulse 92   Resp 16   Wt 189 lb 6.4 oz (85.9 kg)   LMP 06/02/2015   BMI 29.66 kg/m   Gen: WD/WN, NAD Head: [place]/AT, No temporalis wasting.  Ear/Nose/Throat: Hearing grossly intact, nares w/o erythema or drainage Eyes: PER, EOMI, sclera nonicteric.  Neck: Supple, no masses.  No JVD.  Pulmonary:  Good air movement, no use of accessory muscles.  Cardiac: RRR Vascular:  Good thrill and bruit Vessel Right Left  Radial Palpable Palpable  Gastrointestinal: soft, non-distended. No guarding/no peritoneal signs.  Musculoskeletal: M/S 5/5 throughout.  No deformity or atrophy.  Neurologic: Pain and light touch intact in extremities.  Symmetrical.  Speech is fluent. Motor exam as listed  above. Psychiatric: Judgment intact, Mood & affect appropriate for pt's clinical situation. Dermatologic: No Venous rashes. No Ulcers Noted.  No changes consistent with cellulitis. Lymph : No Cervical lymphadenopathy, no lichenification or skin changes of chronic lymphedema.       ASSESSMENT AND PLAN:  1. ESRD on dialysis [facility]) Today that he patient underwent noninvasive studies which revealed a flow volume of 2224.  The left branch basilic trans-AV fistula has a mild velocity increase at the antecubital fossa region as seen previously.  There is mild aneurysmal changes in the mid to distal upper arm region.  The stent placed at the confluence and central venous system is patent.    Recommend:  The patient is doing well and currently has adequate dialysis access. The patient's dialysis center is not reporting any access issues. Flow pattern is stable when compared to the prior ultrasound.  The patient should have a duplex ultrasound of the dialysis access in 12 months. The patient will follow-up with me in the office after each ultrasound   Patient will contact office sooner if there are issues with her dialysis access  - VAS [place] (AVF, AVG); Future  2. Tobacco abuse Smoking cessation was discussed, 3-10 minutes spent on this topic specifically   3. Essential hypertension Continue antihypertensive medications as already ordered, these medications have been reviewed and there are no changes at this time.     Current Outpatient Medications on File Prior to Visit  Medication Sig Dispense Refill  . amLODipine (NORVASC) 10 MG tablet Take 10 mg by mouth daily.     Marland Kitchen aspirin 81 MG tablet Take 81 mg by mouth daily.     . calcitRIOL (ROCALTROL) 0.5 MCG capsule Take 1 capsule (0.5 mcg total) by mouth every Monday, Wednesday, and Friday with hemodialysis.    Marland Kitchen cinacalcet (SENSIPAR) 90 MG tablet TAKE 1 TABLET BY MOUTH DAILY WITH FOOD (DO NOT TAKE LESS THAN 12 HOURS  PRIOR TO DIALYSIS)    . diphenhydrAMINE (BENADRYL) 25 MG tablet Take 25 mg by mouth every 8 (eight) hours as needed (for cough).    . VELPHORO 500 MG chewable tablet CHEW AND SWALLOW 3 TABLETS THREE TIMES DAILY WITH MEALS AND 1 TABLET TWO TIMES DAILY WITH SNACKS    . atenolol (TENORMIN) 50 MG tablet Take 50 mg by mouth 2 (two) times daily.     . calcium acetate (PHOSLO) 667 MG capsule Take 1,334-3,335 mg by mouth 3 (three) times daily with meals. Tales 5 caps with each meal, 2 caps with snacks    . ferric citrate (AURYXIA) 1 GM 210 MG(Fe) tablet Take 420 mg by mouth 3 (three) times daily with meals.    Marland Kitchen lisinopril (PRINIVIL,ZESTRIL) 40 MG tablet Take 40 mg by mouth daily.    . norethindrone (AYGESTIN) 5 MG tablet Take 5 mg by mouth 2 (two) times daily.   11  . sevelamer carbonate (RENVELA) 800 MG tablet Take 800 mg by mouth 3 (three) times daily with meals.     No current facility-administered medications on file prior to visit.     There are no Patient Instructions on file for this visit. Return in about 1 year (around 12/16/2019) for HDA.   Kris Hartmann, NP  This note was completed with Sales executive.  Any errors are purely unintentional.

## 2018-12-17 DIAGNOSIS — K769 Liver disease, unspecified: Secondary | ICD-10-CM | POA: Diagnosis not present

## 2018-12-17 DIAGNOSIS — D631 Anemia in chronic kidney disease: Secondary | ICD-10-CM | POA: Diagnosis not present

## 2018-12-17 DIAGNOSIS — R17 Unspecified jaundice: Secondary | ICD-10-CM | POA: Diagnosis not present

## 2018-12-17 DIAGNOSIS — Z79899 Other long term (current) drug therapy: Secondary | ICD-10-CM | POA: Diagnosis not present

## 2018-12-17 DIAGNOSIS — N2581 Secondary hyperparathyroidism of renal origin: Secondary | ICD-10-CM | POA: Diagnosis not present

## 2018-12-17 DIAGNOSIS — N186 End stage renal disease: Secondary | ICD-10-CM | POA: Diagnosis not present

## 2018-12-18 DIAGNOSIS — D631 Anemia in chronic kidney disease: Secondary | ICD-10-CM | POA: Diagnosis not present

## 2018-12-18 DIAGNOSIS — N186 End stage renal disease: Secondary | ICD-10-CM | POA: Diagnosis not present

## 2018-12-18 DIAGNOSIS — Z79899 Other long term (current) drug therapy: Secondary | ICD-10-CM | POA: Diagnosis not present

## 2018-12-18 DIAGNOSIS — N2581 Secondary hyperparathyroidism of renal origin: Secondary | ICD-10-CM | POA: Diagnosis not present

## 2018-12-18 DIAGNOSIS — K769 Liver disease, unspecified: Secondary | ICD-10-CM | POA: Diagnosis not present

## 2018-12-18 DIAGNOSIS — R17 Unspecified jaundice: Secondary | ICD-10-CM | POA: Diagnosis not present

## 2018-12-19 DIAGNOSIS — N2581 Secondary hyperparathyroidism of renal origin: Secondary | ICD-10-CM | POA: Diagnosis not present

## 2018-12-19 DIAGNOSIS — K769 Liver disease, unspecified: Secondary | ICD-10-CM | POA: Diagnosis not present

## 2018-12-19 DIAGNOSIS — Z79899 Other long term (current) drug therapy: Secondary | ICD-10-CM | POA: Diagnosis not present

## 2018-12-19 DIAGNOSIS — R17 Unspecified jaundice: Secondary | ICD-10-CM | POA: Diagnosis not present

## 2018-12-19 DIAGNOSIS — N186 End stage renal disease: Secondary | ICD-10-CM | POA: Diagnosis not present

## 2018-12-19 DIAGNOSIS — D631 Anemia in chronic kidney disease: Secondary | ICD-10-CM | POA: Diagnosis not present

## 2018-12-20 DIAGNOSIS — Z79899 Other long term (current) drug therapy: Secondary | ICD-10-CM | POA: Diagnosis not present

## 2018-12-20 DIAGNOSIS — K769 Liver disease, unspecified: Secondary | ICD-10-CM | POA: Diagnosis not present

## 2018-12-20 DIAGNOSIS — R17 Unspecified jaundice: Secondary | ICD-10-CM | POA: Diagnosis not present

## 2018-12-20 DIAGNOSIS — D631 Anemia in chronic kidney disease: Secondary | ICD-10-CM | POA: Diagnosis not present

## 2018-12-20 DIAGNOSIS — N2581 Secondary hyperparathyroidism of renal origin: Secondary | ICD-10-CM | POA: Diagnosis not present

## 2018-12-20 DIAGNOSIS — N186 End stage renal disease: Secondary | ICD-10-CM | POA: Diagnosis not present

## 2018-12-21 DIAGNOSIS — Z79899 Other long term (current) drug therapy: Secondary | ICD-10-CM | POA: Diagnosis not present

## 2018-12-21 DIAGNOSIS — D631 Anemia in chronic kidney disease: Secondary | ICD-10-CM | POA: Diagnosis not present

## 2018-12-21 DIAGNOSIS — N2581 Secondary hyperparathyroidism of renal origin: Secondary | ICD-10-CM | POA: Diagnosis not present

## 2018-12-21 DIAGNOSIS — K769 Liver disease, unspecified: Secondary | ICD-10-CM | POA: Diagnosis not present

## 2018-12-21 DIAGNOSIS — N186 End stage renal disease: Secondary | ICD-10-CM | POA: Diagnosis not present

## 2018-12-21 DIAGNOSIS — R17 Unspecified jaundice: Secondary | ICD-10-CM | POA: Diagnosis not present

## 2018-12-22 DIAGNOSIS — R17 Unspecified jaundice: Secondary | ICD-10-CM | POA: Diagnosis not present

## 2018-12-22 DIAGNOSIS — K769 Liver disease, unspecified: Secondary | ICD-10-CM | POA: Diagnosis not present

## 2018-12-22 DIAGNOSIS — N186 End stage renal disease: Secondary | ICD-10-CM | POA: Diagnosis not present

## 2018-12-22 DIAGNOSIS — D631 Anemia in chronic kidney disease: Secondary | ICD-10-CM | POA: Diagnosis not present

## 2018-12-22 DIAGNOSIS — Z79899 Other long term (current) drug therapy: Secondary | ICD-10-CM | POA: Diagnosis not present

## 2018-12-22 DIAGNOSIS — N2581 Secondary hyperparathyroidism of renal origin: Secondary | ICD-10-CM | POA: Diagnosis not present

## 2018-12-23 DIAGNOSIS — Z7682 Awaiting organ transplant status: Secondary | ICD-10-CM | POA: Diagnosis not present

## 2018-12-23 DIAGNOSIS — R17 Unspecified jaundice: Secondary | ICD-10-CM | POA: Diagnosis not present

## 2018-12-23 DIAGNOSIS — Z01810 Encounter for preprocedural cardiovascular examination: Secondary | ICD-10-CM | POA: Diagnosis not present

## 2018-12-23 DIAGNOSIS — K769 Liver disease, unspecified: Secondary | ICD-10-CM | POA: Diagnosis not present

## 2018-12-23 DIAGNOSIS — N2581 Secondary hyperparathyroidism of renal origin: Secondary | ICD-10-CM | POA: Diagnosis not present

## 2018-12-23 DIAGNOSIS — N186 End stage renal disease: Secondary | ICD-10-CM | POA: Diagnosis not present

## 2018-12-23 DIAGNOSIS — D631 Anemia in chronic kidney disease: Secondary | ICD-10-CM | POA: Diagnosis not present

## 2018-12-23 DIAGNOSIS — Z01818 Encounter for other preprocedural examination: Secondary | ICD-10-CM | POA: Diagnosis not present

## 2018-12-23 DIAGNOSIS — Z79899 Other long term (current) drug therapy: Secondary | ICD-10-CM | POA: Diagnosis not present

## 2018-12-24 DIAGNOSIS — N2581 Secondary hyperparathyroidism of renal origin: Secondary | ICD-10-CM | POA: Diagnosis not present

## 2018-12-24 DIAGNOSIS — K769 Liver disease, unspecified: Secondary | ICD-10-CM | POA: Diagnosis not present

## 2018-12-24 DIAGNOSIS — R17 Unspecified jaundice: Secondary | ICD-10-CM | POA: Diagnosis not present

## 2018-12-24 DIAGNOSIS — N186 End stage renal disease: Secondary | ICD-10-CM | POA: Diagnosis not present

## 2018-12-24 DIAGNOSIS — D631 Anemia in chronic kidney disease: Secondary | ICD-10-CM | POA: Diagnosis not present

## 2018-12-24 DIAGNOSIS — Z79899 Other long term (current) drug therapy: Secondary | ICD-10-CM | POA: Diagnosis not present

## 2018-12-25 DIAGNOSIS — N186 End stage renal disease: Secondary | ICD-10-CM | POA: Diagnosis not present

## 2018-12-25 DIAGNOSIS — D631 Anemia in chronic kidney disease: Secondary | ICD-10-CM | POA: Diagnosis not present

## 2018-12-25 DIAGNOSIS — Z79899 Other long term (current) drug therapy: Secondary | ICD-10-CM | POA: Diagnosis not present

## 2018-12-25 DIAGNOSIS — K769 Liver disease, unspecified: Secondary | ICD-10-CM | POA: Diagnosis not present

## 2018-12-25 DIAGNOSIS — R17 Unspecified jaundice: Secondary | ICD-10-CM | POA: Diagnosis not present

## 2018-12-25 DIAGNOSIS — N2581 Secondary hyperparathyroidism of renal origin: Secondary | ICD-10-CM | POA: Diagnosis not present

## 2018-12-26 DIAGNOSIS — N186 End stage renal disease: Secondary | ICD-10-CM | POA: Diagnosis not present

## 2018-12-26 DIAGNOSIS — D631 Anemia in chronic kidney disease: Secondary | ICD-10-CM | POA: Diagnosis not present

## 2018-12-26 DIAGNOSIS — N2581 Secondary hyperparathyroidism of renal origin: Secondary | ICD-10-CM | POA: Diagnosis not present

## 2018-12-26 DIAGNOSIS — R17 Unspecified jaundice: Secondary | ICD-10-CM | POA: Diagnosis not present

## 2018-12-26 DIAGNOSIS — Z79899 Other long term (current) drug therapy: Secondary | ICD-10-CM | POA: Diagnosis not present

## 2018-12-26 DIAGNOSIS — K769 Liver disease, unspecified: Secondary | ICD-10-CM | POA: Diagnosis not present

## 2018-12-27 DIAGNOSIS — Z79899 Other long term (current) drug therapy: Secondary | ICD-10-CM | POA: Diagnosis not present

## 2018-12-27 DIAGNOSIS — K769 Liver disease, unspecified: Secondary | ICD-10-CM | POA: Diagnosis not present

## 2018-12-27 DIAGNOSIS — D631 Anemia in chronic kidney disease: Secondary | ICD-10-CM | POA: Diagnosis not present

## 2018-12-27 DIAGNOSIS — N186 End stage renal disease: Secondary | ICD-10-CM | POA: Diagnosis not present

## 2018-12-27 DIAGNOSIS — R17 Unspecified jaundice: Secondary | ICD-10-CM | POA: Diagnosis not present

## 2018-12-27 DIAGNOSIS — N2581 Secondary hyperparathyroidism of renal origin: Secondary | ICD-10-CM | POA: Diagnosis not present

## 2018-12-28 DIAGNOSIS — N186 End stage renal disease: Secondary | ICD-10-CM | POA: Diagnosis not present

## 2018-12-28 DIAGNOSIS — Z79899 Other long term (current) drug therapy: Secondary | ICD-10-CM | POA: Diagnosis not present

## 2018-12-28 DIAGNOSIS — D631 Anemia in chronic kidney disease: Secondary | ICD-10-CM | POA: Diagnosis not present

## 2018-12-28 DIAGNOSIS — R17 Unspecified jaundice: Secondary | ICD-10-CM | POA: Diagnosis not present

## 2018-12-28 DIAGNOSIS — K769 Liver disease, unspecified: Secondary | ICD-10-CM | POA: Diagnosis not present

## 2018-12-28 DIAGNOSIS — N2581 Secondary hyperparathyroidism of renal origin: Secondary | ICD-10-CM | POA: Diagnosis not present

## 2018-12-29 DIAGNOSIS — N2581 Secondary hyperparathyroidism of renal origin: Secondary | ICD-10-CM | POA: Diagnosis not present

## 2018-12-29 DIAGNOSIS — D631 Anemia in chronic kidney disease: Secondary | ICD-10-CM | POA: Diagnosis not present

## 2018-12-29 DIAGNOSIS — K769 Liver disease, unspecified: Secondary | ICD-10-CM | POA: Diagnosis not present

## 2018-12-29 DIAGNOSIS — N186 End stage renal disease: Secondary | ICD-10-CM | POA: Diagnosis not present

## 2018-12-29 DIAGNOSIS — Z79899 Other long term (current) drug therapy: Secondary | ICD-10-CM | POA: Diagnosis not present

## 2018-12-29 DIAGNOSIS — R17 Unspecified jaundice: Secondary | ICD-10-CM | POA: Diagnosis not present

## 2018-12-30 DIAGNOSIS — N2581 Secondary hyperparathyroidism of renal origin: Secondary | ICD-10-CM | POA: Diagnosis not present

## 2018-12-30 DIAGNOSIS — Z79899 Other long term (current) drug therapy: Secondary | ICD-10-CM | POA: Diagnosis not present

## 2018-12-30 DIAGNOSIS — R17 Unspecified jaundice: Secondary | ICD-10-CM | POA: Diagnosis not present

## 2018-12-30 DIAGNOSIS — K769 Liver disease, unspecified: Secondary | ICD-10-CM | POA: Diagnosis not present

## 2018-12-30 DIAGNOSIS — D631 Anemia in chronic kidney disease: Secondary | ICD-10-CM | POA: Diagnosis not present

## 2018-12-30 DIAGNOSIS — N186 End stage renal disease: Secondary | ICD-10-CM | POA: Diagnosis not present

## 2018-12-31 DIAGNOSIS — K769 Liver disease, unspecified: Secondary | ICD-10-CM | POA: Diagnosis not present

## 2018-12-31 DIAGNOSIS — N186 End stage renal disease: Secondary | ICD-10-CM | POA: Diagnosis not present

## 2018-12-31 DIAGNOSIS — D631 Anemia in chronic kidney disease: Secondary | ICD-10-CM | POA: Diagnosis not present

## 2018-12-31 DIAGNOSIS — N2581 Secondary hyperparathyroidism of renal origin: Secondary | ICD-10-CM | POA: Diagnosis not present

## 2018-12-31 DIAGNOSIS — R17 Unspecified jaundice: Secondary | ICD-10-CM | POA: Diagnosis not present

## 2018-12-31 DIAGNOSIS — Z79899 Other long term (current) drug therapy: Secondary | ICD-10-CM | POA: Diagnosis not present

## 2019-01-01 DIAGNOSIS — R17 Unspecified jaundice: Secondary | ICD-10-CM | POA: Diagnosis not present

## 2019-01-01 DIAGNOSIS — Z79899 Other long term (current) drug therapy: Secondary | ICD-10-CM | POA: Diagnosis not present

## 2019-01-01 DIAGNOSIS — N186 End stage renal disease: Secondary | ICD-10-CM | POA: Diagnosis not present

## 2019-01-01 DIAGNOSIS — K769 Liver disease, unspecified: Secondary | ICD-10-CM | POA: Diagnosis not present

## 2019-01-01 DIAGNOSIS — D631 Anemia in chronic kidney disease: Secondary | ICD-10-CM | POA: Diagnosis not present

## 2019-01-01 DIAGNOSIS — N2581 Secondary hyperparathyroidism of renal origin: Secondary | ICD-10-CM | POA: Diagnosis not present

## 2019-01-02 DIAGNOSIS — Z79899 Other long term (current) drug therapy: Secondary | ICD-10-CM | POA: Diagnosis not present

## 2019-01-02 DIAGNOSIS — K769 Liver disease, unspecified: Secondary | ICD-10-CM | POA: Diagnosis not present

## 2019-01-02 DIAGNOSIS — D631 Anemia in chronic kidney disease: Secondary | ICD-10-CM | POA: Diagnosis not present

## 2019-01-02 DIAGNOSIS — R17 Unspecified jaundice: Secondary | ICD-10-CM | POA: Diagnosis not present

## 2019-01-02 DIAGNOSIS — N186 End stage renal disease: Secondary | ICD-10-CM | POA: Diagnosis not present

## 2019-01-02 DIAGNOSIS — N2581 Secondary hyperparathyroidism of renal origin: Secondary | ICD-10-CM | POA: Diagnosis not present

## 2019-01-03 DIAGNOSIS — K769 Liver disease, unspecified: Secondary | ICD-10-CM | POA: Diagnosis not present

## 2019-01-03 DIAGNOSIS — Z992 Dependence on renal dialysis: Secondary | ICD-10-CM | POA: Diagnosis not present

## 2019-01-03 DIAGNOSIS — Z79899 Other long term (current) drug therapy: Secondary | ICD-10-CM | POA: Diagnosis not present

## 2019-01-03 DIAGNOSIS — D631 Anemia in chronic kidney disease: Secondary | ICD-10-CM | POA: Diagnosis not present

## 2019-01-03 DIAGNOSIS — N186 End stage renal disease: Secondary | ICD-10-CM | POA: Diagnosis not present

## 2019-01-03 DIAGNOSIS — E440 Moderate protein-calorie malnutrition: Secondary | ICD-10-CM | POA: Diagnosis not present

## 2019-01-03 DIAGNOSIS — I129 Hypertensive chronic kidney disease with stage 1 through stage 4 chronic kidney disease, or unspecified chronic kidney disease: Secondary | ICD-10-CM | POA: Diagnosis not present

## 2019-01-03 DIAGNOSIS — R17 Unspecified jaundice: Secondary | ICD-10-CM | POA: Diagnosis not present

## 2019-01-03 DIAGNOSIS — D509 Iron deficiency anemia, unspecified: Secondary | ICD-10-CM | POA: Diagnosis not present

## 2019-01-03 DIAGNOSIS — N2581 Secondary hyperparathyroidism of renal origin: Secondary | ICD-10-CM | POA: Diagnosis not present

## 2019-01-04 DIAGNOSIS — D631 Anemia in chronic kidney disease: Secondary | ICD-10-CM | POA: Diagnosis not present

## 2019-01-04 DIAGNOSIS — N186 End stage renal disease: Secondary | ICD-10-CM | POA: Diagnosis not present

## 2019-01-04 DIAGNOSIS — Z79899 Other long term (current) drug therapy: Secondary | ICD-10-CM | POA: Diagnosis not present

## 2019-01-04 DIAGNOSIS — N2581 Secondary hyperparathyroidism of renal origin: Secondary | ICD-10-CM | POA: Diagnosis not present

## 2019-01-04 DIAGNOSIS — K769 Liver disease, unspecified: Secondary | ICD-10-CM | POA: Diagnosis not present

## 2019-01-04 DIAGNOSIS — R17 Unspecified jaundice: Secondary | ICD-10-CM | POA: Diagnosis not present

## 2019-01-05 DIAGNOSIS — K769 Liver disease, unspecified: Secondary | ICD-10-CM | POA: Diagnosis not present

## 2019-01-05 DIAGNOSIS — D631 Anemia in chronic kidney disease: Secondary | ICD-10-CM | POA: Diagnosis not present

## 2019-01-05 DIAGNOSIS — R17 Unspecified jaundice: Secondary | ICD-10-CM | POA: Diagnosis not present

## 2019-01-05 DIAGNOSIS — N186 End stage renal disease: Secondary | ICD-10-CM | POA: Diagnosis not present

## 2019-01-05 DIAGNOSIS — Z79899 Other long term (current) drug therapy: Secondary | ICD-10-CM | POA: Diagnosis not present

## 2019-01-05 DIAGNOSIS — N2581 Secondary hyperparathyroidism of renal origin: Secondary | ICD-10-CM | POA: Diagnosis not present

## 2019-01-06 DIAGNOSIS — K769 Liver disease, unspecified: Secondary | ICD-10-CM | POA: Diagnosis not present

## 2019-01-06 DIAGNOSIS — N2581 Secondary hyperparathyroidism of renal origin: Secondary | ICD-10-CM | POA: Diagnosis not present

## 2019-01-06 DIAGNOSIS — N186 End stage renal disease: Secondary | ICD-10-CM | POA: Diagnosis not present

## 2019-01-06 DIAGNOSIS — Z79899 Other long term (current) drug therapy: Secondary | ICD-10-CM | POA: Diagnosis not present

## 2019-01-06 DIAGNOSIS — D631 Anemia in chronic kidney disease: Secondary | ICD-10-CM | POA: Diagnosis not present

## 2019-01-06 DIAGNOSIS — R17 Unspecified jaundice: Secondary | ICD-10-CM | POA: Diagnosis not present

## 2019-01-07 DIAGNOSIS — N2581 Secondary hyperparathyroidism of renal origin: Secondary | ICD-10-CM | POA: Diagnosis not present

## 2019-01-07 DIAGNOSIS — D631 Anemia in chronic kidney disease: Secondary | ICD-10-CM | POA: Diagnosis not present

## 2019-01-07 DIAGNOSIS — K769 Liver disease, unspecified: Secondary | ICD-10-CM | POA: Diagnosis not present

## 2019-01-07 DIAGNOSIS — R17 Unspecified jaundice: Secondary | ICD-10-CM | POA: Diagnosis not present

## 2019-01-07 DIAGNOSIS — N186 End stage renal disease: Secondary | ICD-10-CM | POA: Diagnosis not present

## 2019-01-07 DIAGNOSIS — Z79899 Other long term (current) drug therapy: Secondary | ICD-10-CM | POA: Diagnosis not present

## 2019-01-08 DIAGNOSIS — R17 Unspecified jaundice: Secondary | ICD-10-CM | POA: Diagnosis not present

## 2019-01-08 DIAGNOSIS — Z79899 Other long term (current) drug therapy: Secondary | ICD-10-CM | POA: Diagnosis not present

## 2019-01-08 DIAGNOSIS — N2581 Secondary hyperparathyroidism of renal origin: Secondary | ICD-10-CM | POA: Diagnosis not present

## 2019-01-08 DIAGNOSIS — N186 End stage renal disease: Secondary | ICD-10-CM | POA: Diagnosis not present

## 2019-01-08 DIAGNOSIS — K769 Liver disease, unspecified: Secondary | ICD-10-CM | POA: Diagnosis not present

## 2019-01-08 DIAGNOSIS — D631 Anemia in chronic kidney disease: Secondary | ICD-10-CM | POA: Diagnosis not present

## 2019-01-09 DIAGNOSIS — R17 Unspecified jaundice: Secondary | ICD-10-CM | POA: Diagnosis not present

## 2019-01-09 DIAGNOSIS — N2581 Secondary hyperparathyroidism of renal origin: Secondary | ICD-10-CM | POA: Diagnosis not present

## 2019-01-09 DIAGNOSIS — N186 End stage renal disease: Secondary | ICD-10-CM | POA: Diagnosis not present

## 2019-01-09 DIAGNOSIS — Z79899 Other long term (current) drug therapy: Secondary | ICD-10-CM | POA: Diagnosis not present

## 2019-01-09 DIAGNOSIS — K769 Liver disease, unspecified: Secondary | ICD-10-CM | POA: Diagnosis not present

## 2019-01-09 DIAGNOSIS — D631 Anemia in chronic kidney disease: Secondary | ICD-10-CM | POA: Diagnosis not present

## 2019-01-10 DIAGNOSIS — K769 Liver disease, unspecified: Secondary | ICD-10-CM | POA: Diagnosis not present

## 2019-01-10 DIAGNOSIS — D631 Anemia in chronic kidney disease: Secondary | ICD-10-CM | POA: Diagnosis not present

## 2019-01-10 DIAGNOSIS — Z79899 Other long term (current) drug therapy: Secondary | ICD-10-CM | POA: Diagnosis not present

## 2019-01-10 DIAGNOSIS — N186 End stage renal disease: Secondary | ICD-10-CM | POA: Diagnosis not present

## 2019-01-10 DIAGNOSIS — R82998 Other abnormal findings in urine: Secondary | ICD-10-CM | POA: Diagnosis not present

## 2019-01-10 DIAGNOSIS — R17 Unspecified jaundice: Secondary | ICD-10-CM | POA: Diagnosis not present

## 2019-01-10 DIAGNOSIS — N2581 Secondary hyperparathyroidism of renal origin: Secondary | ICD-10-CM | POA: Diagnosis not present

## 2019-01-11 DIAGNOSIS — R17 Unspecified jaundice: Secondary | ICD-10-CM | POA: Diagnosis not present

## 2019-01-11 DIAGNOSIS — K769 Liver disease, unspecified: Secondary | ICD-10-CM | POA: Diagnosis not present

## 2019-01-11 DIAGNOSIS — N2581 Secondary hyperparathyroidism of renal origin: Secondary | ICD-10-CM | POA: Diagnosis not present

## 2019-01-11 DIAGNOSIS — N186 End stage renal disease: Secondary | ICD-10-CM | POA: Diagnosis not present

## 2019-01-11 DIAGNOSIS — D631 Anemia in chronic kidney disease: Secondary | ICD-10-CM | POA: Diagnosis not present

## 2019-01-11 DIAGNOSIS — Z79899 Other long term (current) drug therapy: Secondary | ICD-10-CM | POA: Diagnosis not present

## 2019-01-12 DIAGNOSIS — K769 Liver disease, unspecified: Secondary | ICD-10-CM | POA: Diagnosis not present

## 2019-01-12 DIAGNOSIS — D631 Anemia in chronic kidney disease: Secondary | ICD-10-CM | POA: Diagnosis not present

## 2019-01-12 DIAGNOSIS — N186 End stage renal disease: Secondary | ICD-10-CM | POA: Diagnosis not present

## 2019-01-12 DIAGNOSIS — N2581 Secondary hyperparathyroidism of renal origin: Secondary | ICD-10-CM | POA: Diagnosis not present

## 2019-01-12 DIAGNOSIS — Z79899 Other long term (current) drug therapy: Secondary | ICD-10-CM | POA: Diagnosis not present

## 2019-01-12 DIAGNOSIS — R17 Unspecified jaundice: Secondary | ICD-10-CM | POA: Diagnosis not present

## 2019-01-13 DIAGNOSIS — N2581 Secondary hyperparathyroidism of renal origin: Secondary | ICD-10-CM | POA: Diagnosis not present

## 2019-01-13 DIAGNOSIS — Z79899 Other long term (current) drug therapy: Secondary | ICD-10-CM | POA: Diagnosis not present

## 2019-01-13 DIAGNOSIS — K769 Liver disease, unspecified: Secondary | ICD-10-CM | POA: Diagnosis not present

## 2019-01-13 DIAGNOSIS — D631 Anemia in chronic kidney disease: Secondary | ICD-10-CM | POA: Diagnosis not present

## 2019-01-13 DIAGNOSIS — N186 End stage renal disease: Secondary | ICD-10-CM | POA: Diagnosis not present

## 2019-01-13 DIAGNOSIS — R17 Unspecified jaundice: Secondary | ICD-10-CM | POA: Diagnosis not present

## 2019-01-14 DIAGNOSIS — R17 Unspecified jaundice: Secondary | ICD-10-CM | POA: Diagnosis not present

## 2019-01-14 DIAGNOSIS — Z79899 Other long term (current) drug therapy: Secondary | ICD-10-CM | POA: Diagnosis not present

## 2019-01-14 DIAGNOSIS — N2581 Secondary hyperparathyroidism of renal origin: Secondary | ICD-10-CM | POA: Diagnosis not present

## 2019-01-14 DIAGNOSIS — N186 End stage renal disease: Secondary | ICD-10-CM | POA: Diagnosis not present

## 2019-01-14 DIAGNOSIS — K769 Liver disease, unspecified: Secondary | ICD-10-CM | POA: Diagnosis not present

## 2019-01-14 DIAGNOSIS — D631 Anemia in chronic kidney disease: Secondary | ICD-10-CM | POA: Diagnosis not present

## 2019-01-15 DIAGNOSIS — N2581 Secondary hyperparathyroidism of renal origin: Secondary | ICD-10-CM | POA: Diagnosis not present

## 2019-01-15 DIAGNOSIS — R17 Unspecified jaundice: Secondary | ICD-10-CM | POA: Diagnosis not present

## 2019-01-15 DIAGNOSIS — K769 Liver disease, unspecified: Secondary | ICD-10-CM | POA: Diagnosis not present

## 2019-01-15 DIAGNOSIS — N186 End stage renal disease: Secondary | ICD-10-CM | POA: Diagnosis not present

## 2019-01-15 DIAGNOSIS — D631 Anemia in chronic kidney disease: Secondary | ICD-10-CM | POA: Diagnosis not present

## 2019-01-15 DIAGNOSIS — Z79899 Other long term (current) drug therapy: Secondary | ICD-10-CM | POA: Diagnosis not present

## 2019-01-16 DIAGNOSIS — R17 Unspecified jaundice: Secondary | ICD-10-CM | POA: Diagnosis not present

## 2019-01-16 DIAGNOSIS — D631 Anemia in chronic kidney disease: Secondary | ICD-10-CM | POA: Diagnosis not present

## 2019-01-16 DIAGNOSIS — N2581 Secondary hyperparathyroidism of renal origin: Secondary | ICD-10-CM | POA: Diagnosis not present

## 2019-01-16 DIAGNOSIS — K769 Liver disease, unspecified: Secondary | ICD-10-CM | POA: Diagnosis not present

## 2019-01-16 DIAGNOSIS — Z79899 Other long term (current) drug therapy: Secondary | ICD-10-CM | POA: Diagnosis not present

## 2019-01-16 DIAGNOSIS — N186 End stage renal disease: Secondary | ICD-10-CM | POA: Diagnosis not present

## 2019-01-17 DIAGNOSIS — Z79899 Other long term (current) drug therapy: Secondary | ICD-10-CM | POA: Diagnosis not present

## 2019-01-17 DIAGNOSIS — N2581 Secondary hyperparathyroidism of renal origin: Secondary | ICD-10-CM | POA: Diagnosis not present

## 2019-01-17 DIAGNOSIS — D631 Anemia in chronic kidney disease: Secondary | ICD-10-CM | POA: Diagnosis not present

## 2019-01-17 DIAGNOSIS — N186 End stage renal disease: Secondary | ICD-10-CM | POA: Diagnosis not present

## 2019-01-17 DIAGNOSIS — K769 Liver disease, unspecified: Secondary | ICD-10-CM | POA: Diagnosis not present

## 2019-01-17 DIAGNOSIS — R17 Unspecified jaundice: Secondary | ICD-10-CM | POA: Diagnosis not present

## 2019-01-18 DIAGNOSIS — R17 Unspecified jaundice: Secondary | ICD-10-CM | POA: Diagnosis not present

## 2019-01-18 DIAGNOSIS — Z79899 Other long term (current) drug therapy: Secondary | ICD-10-CM | POA: Diagnosis not present

## 2019-01-18 DIAGNOSIS — D631 Anemia in chronic kidney disease: Secondary | ICD-10-CM | POA: Diagnosis not present

## 2019-01-18 DIAGNOSIS — K769 Liver disease, unspecified: Secondary | ICD-10-CM | POA: Diagnosis not present

## 2019-01-18 DIAGNOSIS — N2581 Secondary hyperparathyroidism of renal origin: Secondary | ICD-10-CM | POA: Diagnosis not present

## 2019-01-18 DIAGNOSIS — N186 End stage renal disease: Secondary | ICD-10-CM | POA: Diagnosis not present

## 2019-01-19 DIAGNOSIS — D631 Anemia in chronic kidney disease: Secondary | ICD-10-CM | POA: Diagnosis not present

## 2019-01-19 DIAGNOSIS — K769 Liver disease, unspecified: Secondary | ICD-10-CM | POA: Diagnosis not present

## 2019-01-19 DIAGNOSIS — R17 Unspecified jaundice: Secondary | ICD-10-CM | POA: Diagnosis not present

## 2019-01-19 DIAGNOSIS — Z79899 Other long term (current) drug therapy: Secondary | ICD-10-CM | POA: Diagnosis not present

## 2019-01-19 DIAGNOSIS — N186 End stage renal disease: Secondary | ICD-10-CM | POA: Diagnosis not present

## 2019-01-19 DIAGNOSIS — N2581 Secondary hyperparathyroidism of renal origin: Secondary | ICD-10-CM | POA: Diagnosis not present

## 2019-01-20 DIAGNOSIS — N186 End stage renal disease: Secondary | ICD-10-CM | POA: Diagnosis not present

## 2019-01-20 DIAGNOSIS — R17 Unspecified jaundice: Secondary | ICD-10-CM | POA: Diagnosis not present

## 2019-01-20 DIAGNOSIS — K769 Liver disease, unspecified: Secondary | ICD-10-CM | POA: Diagnosis not present

## 2019-01-20 DIAGNOSIS — D631 Anemia in chronic kidney disease: Secondary | ICD-10-CM | POA: Diagnosis not present

## 2019-01-20 DIAGNOSIS — N2581 Secondary hyperparathyroidism of renal origin: Secondary | ICD-10-CM | POA: Diagnosis not present

## 2019-01-20 DIAGNOSIS — Z79899 Other long term (current) drug therapy: Secondary | ICD-10-CM | POA: Diagnosis not present

## 2019-01-21 DIAGNOSIS — D631 Anemia in chronic kidney disease: Secondary | ICD-10-CM | POA: Diagnosis not present

## 2019-01-21 DIAGNOSIS — K769 Liver disease, unspecified: Secondary | ICD-10-CM | POA: Diagnosis not present

## 2019-01-21 DIAGNOSIS — Z79899 Other long term (current) drug therapy: Secondary | ICD-10-CM | POA: Diagnosis not present

## 2019-01-21 DIAGNOSIS — N186 End stage renal disease: Secondary | ICD-10-CM | POA: Diagnosis not present

## 2019-01-21 DIAGNOSIS — R17 Unspecified jaundice: Secondary | ICD-10-CM | POA: Diagnosis not present

## 2019-01-21 DIAGNOSIS — N2581 Secondary hyperparathyroidism of renal origin: Secondary | ICD-10-CM | POA: Diagnosis not present

## 2019-01-22 DIAGNOSIS — N2581 Secondary hyperparathyroidism of renal origin: Secondary | ICD-10-CM | POA: Diagnosis not present

## 2019-01-22 DIAGNOSIS — Z79899 Other long term (current) drug therapy: Secondary | ICD-10-CM | POA: Diagnosis not present

## 2019-01-22 DIAGNOSIS — N186 End stage renal disease: Secondary | ICD-10-CM | POA: Diagnosis not present

## 2019-01-22 DIAGNOSIS — R17 Unspecified jaundice: Secondary | ICD-10-CM | POA: Diagnosis not present

## 2019-01-22 DIAGNOSIS — K769 Liver disease, unspecified: Secondary | ICD-10-CM | POA: Diagnosis not present

## 2019-01-22 DIAGNOSIS — D631 Anemia in chronic kidney disease: Secondary | ICD-10-CM | POA: Diagnosis not present

## 2019-01-23 DIAGNOSIS — K769 Liver disease, unspecified: Secondary | ICD-10-CM | POA: Diagnosis not present

## 2019-01-23 DIAGNOSIS — D631 Anemia in chronic kidney disease: Secondary | ICD-10-CM | POA: Diagnosis not present

## 2019-01-23 DIAGNOSIS — N2581 Secondary hyperparathyroidism of renal origin: Secondary | ICD-10-CM | POA: Diagnosis not present

## 2019-01-23 DIAGNOSIS — Z79899 Other long term (current) drug therapy: Secondary | ICD-10-CM | POA: Diagnosis not present

## 2019-01-23 DIAGNOSIS — N186 End stage renal disease: Secondary | ICD-10-CM | POA: Diagnosis not present

## 2019-01-23 DIAGNOSIS — R17 Unspecified jaundice: Secondary | ICD-10-CM | POA: Diagnosis not present

## 2019-01-24 DIAGNOSIS — R17 Unspecified jaundice: Secondary | ICD-10-CM | POA: Diagnosis not present

## 2019-01-24 DIAGNOSIS — K769 Liver disease, unspecified: Secondary | ICD-10-CM | POA: Diagnosis not present

## 2019-01-24 DIAGNOSIS — N2581 Secondary hyperparathyroidism of renal origin: Secondary | ICD-10-CM | POA: Diagnosis not present

## 2019-01-24 DIAGNOSIS — N186 End stage renal disease: Secondary | ICD-10-CM | POA: Diagnosis not present

## 2019-01-24 DIAGNOSIS — D631 Anemia in chronic kidney disease: Secondary | ICD-10-CM | POA: Diagnosis not present

## 2019-01-24 DIAGNOSIS — Z79899 Other long term (current) drug therapy: Secondary | ICD-10-CM | POA: Diagnosis not present

## 2019-01-25 DIAGNOSIS — D631 Anemia in chronic kidney disease: Secondary | ICD-10-CM | POA: Diagnosis not present

## 2019-01-25 DIAGNOSIS — N186 End stage renal disease: Secondary | ICD-10-CM | POA: Diagnosis not present

## 2019-01-25 DIAGNOSIS — Z79899 Other long term (current) drug therapy: Secondary | ICD-10-CM | POA: Diagnosis not present

## 2019-01-25 DIAGNOSIS — R17 Unspecified jaundice: Secondary | ICD-10-CM | POA: Diagnosis not present

## 2019-01-25 DIAGNOSIS — K769 Liver disease, unspecified: Secondary | ICD-10-CM | POA: Diagnosis not present

## 2019-01-25 DIAGNOSIS — N2581 Secondary hyperparathyroidism of renal origin: Secondary | ICD-10-CM | POA: Diagnosis not present

## 2019-01-26 DIAGNOSIS — Z79899 Other long term (current) drug therapy: Secondary | ICD-10-CM | POA: Diagnosis not present

## 2019-01-26 DIAGNOSIS — D631 Anemia in chronic kidney disease: Secondary | ICD-10-CM | POA: Diagnosis not present

## 2019-01-26 DIAGNOSIS — N2581 Secondary hyperparathyroidism of renal origin: Secondary | ICD-10-CM | POA: Diagnosis not present

## 2019-01-26 DIAGNOSIS — R17 Unspecified jaundice: Secondary | ICD-10-CM | POA: Diagnosis not present

## 2019-01-26 DIAGNOSIS — K769 Liver disease, unspecified: Secondary | ICD-10-CM | POA: Diagnosis not present

## 2019-01-26 DIAGNOSIS — N186 End stage renal disease: Secondary | ICD-10-CM | POA: Diagnosis not present

## 2019-01-27 DIAGNOSIS — K769 Liver disease, unspecified: Secondary | ICD-10-CM | POA: Diagnosis not present

## 2019-01-27 DIAGNOSIS — D631 Anemia in chronic kidney disease: Secondary | ICD-10-CM | POA: Diagnosis not present

## 2019-01-27 DIAGNOSIS — R17 Unspecified jaundice: Secondary | ICD-10-CM | POA: Diagnosis not present

## 2019-01-27 DIAGNOSIS — Z79899 Other long term (current) drug therapy: Secondary | ICD-10-CM | POA: Diagnosis not present

## 2019-01-27 DIAGNOSIS — N186 End stage renal disease: Secondary | ICD-10-CM | POA: Diagnosis not present

## 2019-01-27 DIAGNOSIS — N2581 Secondary hyperparathyroidism of renal origin: Secondary | ICD-10-CM | POA: Diagnosis not present

## 2019-01-28 DIAGNOSIS — D631 Anemia in chronic kidney disease: Secondary | ICD-10-CM | POA: Diagnosis not present

## 2019-01-28 DIAGNOSIS — N2581 Secondary hyperparathyroidism of renal origin: Secondary | ICD-10-CM | POA: Diagnosis not present

## 2019-01-28 DIAGNOSIS — Z79899 Other long term (current) drug therapy: Secondary | ICD-10-CM | POA: Diagnosis not present

## 2019-01-28 DIAGNOSIS — N186 End stage renal disease: Secondary | ICD-10-CM | POA: Diagnosis not present

## 2019-01-28 DIAGNOSIS — R17 Unspecified jaundice: Secondary | ICD-10-CM | POA: Diagnosis not present

## 2019-01-28 DIAGNOSIS — K769 Liver disease, unspecified: Secondary | ICD-10-CM | POA: Diagnosis not present

## 2019-01-29 DIAGNOSIS — K769 Liver disease, unspecified: Secondary | ICD-10-CM | POA: Diagnosis not present

## 2019-01-29 DIAGNOSIS — Z79899 Other long term (current) drug therapy: Secondary | ICD-10-CM | POA: Diagnosis not present

## 2019-01-29 DIAGNOSIS — N2581 Secondary hyperparathyroidism of renal origin: Secondary | ICD-10-CM | POA: Diagnosis not present

## 2019-01-29 DIAGNOSIS — D631 Anemia in chronic kidney disease: Secondary | ICD-10-CM | POA: Diagnosis not present

## 2019-01-29 DIAGNOSIS — R17 Unspecified jaundice: Secondary | ICD-10-CM | POA: Diagnosis not present

## 2019-01-29 DIAGNOSIS — N186 End stage renal disease: Secondary | ICD-10-CM | POA: Diagnosis not present

## 2019-01-30 DIAGNOSIS — N186 End stage renal disease: Secondary | ICD-10-CM | POA: Diagnosis not present

## 2019-01-30 DIAGNOSIS — K769 Liver disease, unspecified: Secondary | ICD-10-CM | POA: Diagnosis not present

## 2019-01-30 DIAGNOSIS — N2581 Secondary hyperparathyroidism of renal origin: Secondary | ICD-10-CM | POA: Diagnosis not present

## 2019-01-30 DIAGNOSIS — Z79899 Other long term (current) drug therapy: Secondary | ICD-10-CM | POA: Diagnosis not present

## 2019-01-30 DIAGNOSIS — D631 Anemia in chronic kidney disease: Secondary | ICD-10-CM | POA: Diagnosis not present

## 2019-01-30 DIAGNOSIS — R17 Unspecified jaundice: Secondary | ICD-10-CM | POA: Diagnosis not present

## 2019-01-31 DIAGNOSIS — N186 End stage renal disease: Secondary | ICD-10-CM | POA: Diagnosis not present

## 2019-01-31 DIAGNOSIS — N2581 Secondary hyperparathyroidism of renal origin: Secondary | ICD-10-CM | POA: Diagnosis not present

## 2019-01-31 DIAGNOSIS — R17 Unspecified jaundice: Secondary | ICD-10-CM | POA: Diagnosis not present

## 2019-01-31 DIAGNOSIS — Z79899 Other long term (current) drug therapy: Secondary | ICD-10-CM | POA: Diagnosis not present

## 2019-01-31 DIAGNOSIS — K769 Liver disease, unspecified: Secondary | ICD-10-CM | POA: Diagnosis not present

## 2019-01-31 DIAGNOSIS — D631 Anemia in chronic kidney disease: Secondary | ICD-10-CM | POA: Diagnosis not present

## 2019-02-01 DIAGNOSIS — N6322 Unspecified lump in the left breast, upper inner quadrant: Secondary | ICD-10-CM | POA: Diagnosis not present

## 2019-02-01 DIAGNOSIS — Z01818 Encounter for other preprocedural examination: Secondary | ICD-10-CM | POA: Diagnosis not present

## 2019-02-01 DIAGNOSIS — D631 Anemia in chronic kidney disease: Secondary | ICD-10-CM | POA: Diagnosis not present

## 2019-02-01 DIAGNOSIS — N2581 Secondary hyperparathyroidism of renal origin: Secondary | ICD-10-CM | POA: Diagnosis not present

## 2019-02-01 DIAGNOSIS — Z79899 Other long term (current) drug therapy: Secondary | ICD-10-CM | POA: Diagnosis not present

## 2019-02-01 DIAGNOSIS — Z1231 Encounter for screening mammogram for malignant neoplasm of breast: Secondary | ICD-10-CM | POA: Diagnosis not present

## 2019-02-01 DIAGNOSIS — R17 Unspecified jaundice: Secondary | ICD-10-CM | POA: Diagnosis not present

## 2019-02-01 DIAGNOSIS — N186 End stage renal disease: Secondary | ICD-10-CM | POA: Diagnosis not present

## 2019-02-01 DIAGNOSIS — K769 Liver disease, unspecified: Secondary | ICD-10-CM | POA: Diagnosis not present

## 2019-02-01 DIAGNOSIS — Z1239 Encounter for other screening for malignant neoplasm of breast: Secondary | ICD-10-CM | POA: Diagnosis not present

## 2019-02-02 DIAGNOSIS — N2589 Other disorders resulting from impaired renal tubular function: Secondary | ICD-10-CM | POA: Diagnosis not present

## 2019-02-02 DIAGNOSIS — D509 Iron deficiency anemia, unspecified: Secondary | ICD-10-CM | POA: Diagnosis not present

## 2019-02-02 DIAGNOSIS — N186 End stage renal disease: Secondary | ICD-10-CM | POA: Diagnosis not present

## 2019-02-02 DIAGNOSIS — D631 Anemia in chronic kidney disease: Secondary | ICD-10-CM | POA: Diagnosis not present

## 2019-02-02 DIAGNOSIS — Z992 Dependence on renal dialysis: Secondary | ICD-10-CM | POA: Diagnosis not present

## 2019-02-02 DIAGNOSIS — Z4932 Encounter for adequacy testing for peritoneal dialysis: Secondary | ICD-10-CM | POA: Diagnosis not present

## 2019-02-02 DIAGNOSIS — I129 Hypertensive chronic kidney disease with stage 1 through stage 4 chronic kidney disease, or unspecified chronic kidney disease: Secondary | ICD-10-CM | POA: Diagnosis not present

## 2019-02-02 DIAGNOSIS — N2581 Secondary hyperparathyroidism of renal origin: Secondary | ICD-10-CM | POA: Diagnosis not present

## 2019-02-02 DIAGNOSIS — K769 Liver disease, unspecified: Secondary | ICD-10-CM | POA: Diagnosis not present

## 2019-02-03 DIAGNOSIS — D509 Iron deficiency anemia, unspecified: Secondary | ICD-10-CM | POA: Diagnosis not present

## 2019-02-03 DIAGNOSIS — N186 End stage renal disease: Secondary | ICD-10-CM | POA: Diagnosis not present

## 2019-02-03 DIAGNOSIS — N2581 Secondary hyperparathyroidism of renal origin: Secondary | ICD-10-CM | POA: Diagnosis not present

## 2019-02-03 DIAGNOSIS — D631 Anemia in chronic kidney disease: Secondary | ICD-10-CM | POA: Diagnosis not present

## 2019-02-03 DIAGNOSIS — Z4932 Encounter for adequacy testing for peritoneal dialysis: Secondary | ICD-10-CM | POA: Diagnosis not present

## 2019-02-03 DIAGNOSIS — N2589 Other disorders resulting from impaired renal tubular function: Secondary | ICD-10-CM | POA: Diagnosis not present

## 2019-02-04 DIAGNOSIS — N2589 Other disorders resulting from impaired renal tubular function: Secondary | ICD-10-CM | POA: Diagnosis not present

## 2019-02-04 DIAGNOSIS — N2581 Secondary hyperparathyroidism of renal origin: Secondary | ICD-10-CM | POA: Diagnosis not present

## 2019-02-04 DIAGNOSIS — N186 End stage renal disease: Secondary | ICD-10-CM | POA: Diagnosis not present

## 2019-02-04 DIAGNOSIS — D631 Anemia in chronic kidney disease: Secondary | ICD-10-CM | POA: Diagnosis not present

## 2019-02-04 DIAGNOSIS — Z4932 Encounter for adequacy testing for peritoneal dialysis: Secondary | ICD-10-CM | POA: Diagnosis not present

## 2019-02-04 DIAGNOSIS — D509 Iron deficiency anemia, unspecified: Secondary | ICD-10-CM | POA: Diagnosis not present

## 2019-02-05 DIAGNOSIS — D631 Anemia in chronic kidney disease: Secondary | ICD-10-CM | POA: Diagnosis not present

## 2019-02-05 DIAGNOSIS — N2581 Secondary hyperparathyroidism of renal origin: Secondary | ICD-10-CM | POA: Diagnosis not present

## 2019-02-05 DIAGNOSIS — N186 End stage renal disease: Secondary | ICD-10-CM | POA: Diagnosis not present

## 2019-02-05 DIAGNOSIS — D509 Iron deficiency anemia, unspecified: Secondary | ICD-10-CM | POA: Diagnosis not present

## 2019-02-05 DIAGNOSIS — Z4932 Encounter for adequacy testing for peritoneal dialysis: Secondary | ICD-10-CM | POA: Diagnosis not present

## 2019-02-05 DIAGNOSIS — N2589 Other disorders resulting from impaired renal tubular function: Secondary | ICD-10-CM | POA: Diagnosis not present

## 2019-02-06 DIAGNOSIS — N2581 Secondary hyperparathyroidism of renal origin: Secondary | ICD-10-CM | POA: Diagnosis not present

## 2019-02-06 DIAGNOSIS — N2589 Other disorders resulting from impaired renal tubular function: Secondary | ICD-10-CM | POA: Diagnosis not present

## 2019-02-06 DIAGNOSIS — D631 Anemia in chronic kidney disease: Secondary | ICD-10-CM | POA: Diagnosis not present

## 2019-02-06 DIAGNOSIS — N186 End stage renal disease: Secondary | ICD-10-CM | POA: Diagnosis not present

## 2019-02-06 DIAGNOSIS — Z4932 Encounter for adequacy testing for peritoneal dialysis: Secondary | ICD-10-CM | POA: Diagnosis not present

## 2019-02-06 DIAGNOSIS — D509 Iron deficiency anemia, unspecified: Secondary | ICD-10-CM | POA: Diagnosis not present

## 2019-02-07 DIAGNOSIS — D509 Iron deficiency anemia, unspecified: Secondary | ICD-10-CM | POA: Diagnosis not present

## 2019-02-07 DIAGNOSIS — Z4932 Encounter for adequacy testing for peritoneal dialysis: Secondary | ICD-10-CM | POA: Diagnosis not present

## 2019-02-07 DIAGNOSIS — N186 End stage renal disease: Secondary | ICD-10-CM | POA: Diagnosis not present

## 2019-02-07 DIAGNOSIS — N2589 Other disorders resulting from impaired renal tubular function: Secondary | ICD-10-CM | POA: Diagnosis not present

## 2019-02-07 DIAGNOSIS — N2581 Secondary hyperparathyroidism of renal origin: Secondary | ICD-10-CM | POA: Diagnosis not present

## 2019-02-07 DIAGNOSIS — D631 Anemia in chronic kidney disease: Secondary | ICD-10-CM | POA: Diagnosis not present

## 2019-02-08 DIAGNOSIS — N2581 Secondary hyperparathyroidism of renal origin: Secondary | ICD-10-CM | POA: Diagnosis not present

## 2019-02-08 DIAGNOSIS — E1129 Type 2 diabetes mellitus with other diabetic kidney complication: Secondary | ICD-10-CM | POA: Diagnosis not present

## 2019-02-08 DIAGNOSIS — Z4932 Encounter for adequacy testing for peritoneal dialysis: Secondary | ICD-10-CM | POA: Diagnosis not present

## 2019-02-08 DIAGNOSIS — R82998 Other abnormal findings in urine: Secondary | ICD-10-CM | POA: Diagnosis not present

## 2019-02-08 DIAGNOSIS — D631 Anemia in chronic kidney disease: Secondary | ICD-10-CM | POA: Diagnosis not present

## 2019-02-08 DIAGNOSIS — N186 End stage renal disease: Secondary | ICD-10-CM | POA: Diagnosis not present

## 2019-02-08 DIAGNOSIS — E7849 Other hyperlipidemia: Secondary | ICD-10-CM | POA: Diagnosis not present

## 2019-02-08 DIAGNOSIS — N2589 Other disorders resulting from impaired renal tubular function: Secondary | ICD-10-CM | POA: Diagnosis not present

## 2019-02-08 DIAGNOSIS — D509 Iron deficiency anemia, unspecified: Secondary | ICD-10-CM | POA: Diagnosis not present

## 2019-02-09 DIAGNOSIS — D509 Iron deficiency anemia, unspecified: Secondary | ICD-10-CM | POA: Diagnosis not present

## 2019-02-09 DIAGNOSIS — Z4932 Encounter for adequacy testing for peritoneal dialysis: Secondary | ICD-10-CM | POA: Diagnosis not present

## 2019-02-09 DIAGNOSIS — N2589 Other disorders resulting from impaired renal tubular function: Secondary | ICD-10-CM | POA: Diagnosis not present

## 2019-02-09 DIAGNOSIS — D631 Anemia in chronic kidney disease: Secondary | ICD-10-CM | POA: Diagnosis not present

## 2019-02-09 DIAGNOSIS — N2581 Secondary hyperparathyroidism of renal origin: Secondary | ICD-10-CM | POA: Diagnosis not present

## 2019-02-09 DIAGNOSIS — N186 End stage renal disease: Secondary | ICD-10-CM | POA: Diagnosis not present

## 2019-02-10 ENCOUNTER — Other Ambulatory Visit: Payer: Self-pay

## 2019-02-10 ENCOUNTER — Encounter: Payer: Self-pay | Admitting: Internal Medicine

## 2019-02-10 ENCOUNTER — Ambulatory Visit (INDEPENDENT_AMBULATORY_CARE_PROVIDER_SITE_OTHER): Payer: Medicare Other | Admitting: Internal Medicine

## 2019-02-10 VITALS — Ht 67.0 in | Wt 180.0 lb

## 2019-02-10 DIAGNOSIS — D631 Anemia in chronic kidney disease: Secondary | ICD-10-CM | POA: Diagnosis not present

## 2019-02-10 DIAGNOSIS — Z7682 Awaiting organ transplant status: Secondary | ICD-10-CM | POA: Diagnosis not present

## 2019-02-10 DIAGNOSIS — N2589 Other disorders resulting from impaired renal tubular function: Secondary | ICD-10-CM | POA: Diagnosis not present

## 2019-02-10 DIAGNOSIS — Z1211 Encounter for screening for malignant neoplasm of colon: Secondary | ICD-10-CM | POA: Diagnosis not present

## 2019-02-10 DIAGNOSIS — Z992 Dependence on renal dialysis: Secondary | ICD-10-CM | POA: Diagnosis not present

## 2019-02-10 DIAGNOSIS — N186 End stage renal disease: Secondary | ICD-10-CM | POA: Diagnosis not present

## 2019-02-10 DIAGNOSIS — N2581 Secondary hyperparathyroidism of renal origin: Secondary | ICD-10-CM | POA: Diagnosis not present

## 2019-02-10 DIAGNOSIS — Z4932 Encounter for adequacy testing for peritoneal dialysis: Secondary | ICD-10-CM | POA: Diagnosis not present

## 2019-02-10 DIAGNOSIS — D509 Iron deficiency anemia, unspecified: Secondary | ICD-10-CM | POA: Diagnosis not present

## 2019-02-10 NOTE — Patient Instructions (Signed)
It was nice to meet you over the phone today.  A screening colonoscopy is an appropriate procedure for you so we will set that up as we discussed.   It is my understanding that the dialysis nurse has told us that antibiotics prior to the colonoscopy are in your best interest to reduce infection of your peritoneal dialysis catheter and to reduce the risk of peritonitis.  We will communicate with them and sort that out.   I appreciate the opportunity to care for you. Gatha Mayer, MD, [facility]

## 2019-02-10 NOTE — Progress Notes (Signed)
TELEHEALTH ENCOUNTER IN SETTING OF COVID-19 PANDEMIC - REQUESTED BY PATIENT SERVICE PROVIDED BY TELEMEDECINE - TYPE: phone PATIENT LOCATION: Home PATIENT HAS CONSENTED TO TELEHEALTH VISIT PROVIDER LOCATION: OFFICE REFERRING PROVIDER:Ryan Sanford, MD PARTICIPANTS OTHER THAN PATIENT:none TIME SPENT ON CALL:11 mins   Ruth Gutierrez 53 y.o. [birth year] [id number]  Assessment & Plan:   Encounter Diagnoses  Name Primary?   Colon cancer screening Yes   ESRD on dialysis [facility]) - home peritoneal    Kidney transplant candidate     Screening colonoscopy is appropriate.  Per communication from the home dialysis nurse antibiotic prophylaxis is requested peri-procedurally.  This seems reasonable given risk for peritonitis given that she is on home peritoneal dialysis.  We will ask what regimen they want [place] to use.  The risks and benefits as well as alternatives of endoscopic procedure(s) have been discussed and reviewed. All questions answered. The patient agrees to proceed.  I have also explained that there is potential though hopefully rare, of contracting coronavirus SARS to virus during the process of colonoscopy procedure.  She understands and accepts that risk.   I appreciate the opportunity to care for this patient. CC: Rexene Agent, MD     Subjective:   Chief Complaint: Colon cancer screening  HPI This 53 year old African-American woman is being considered for renal transplant at [facility], she has a history of a failed kidney transplant after transplantation at [facility] in the past.  She does not have any active GI symptoms.  She is on chronic ambulatory peritoneal dialysis doing cyclic exchanges overnight.  She is followed by Dr. Joelyn Oms.  She has not had a colonoscopy.  The visit is requested to arrange for a screening colonoscopy in the setting of dialysis.  Per information we received about the appointment it is recommended that she have prophylactic antibiotics to  reduce the risk of peritonitis. Allergies  Allergen Reactions   Penicillin G Itching and Rash   Penicillins Itching and Rash   Current Meds  Medication Sig   amLODipine (NORVASC) 10 MG tablet Take 10 mg by mouth daily.    aspirin 81 MG tablet Take 81 mg by mouth daily.    atenolol (TENORMIN) 50 MG tablet Take 50 mg by mouth 2 (two) times daily.    calcitRIOL (ROCALTROL) 0.5 MCG capsule Take 1 capsule (0.5 mcg total) by mouth every Monday, Wednesday, and Friday with hemodialysis.   calcium acetate (PHOSLO) 667 MG capsule Take 1,334-3,335 mg by mouth 3 (three) times daily with meals. Tales 5 caps with each meal, 2 caps with snacks   cinacalcet (SENSIPAR) 90 MG tablet TAKE 1 TABLET BY MOUTH DAILY WITH FOOD (DO NOT TAKE LESS THAN 12 HOURS PRIOR TO DIALYSIS)   diphenhydrAMINE (BENADRYL) 25 MG tablet Take 25 mg by mouth every 8 (eight) hours as needed (for cough).   ferric citrate (AURYXIA) 1 GM 210 MG(Fe) tablet Take 420 mg by mouth 3 (three) times daily with meals.   Past Medical History:  Diagnosis Date   Anemia of chronic disease    Arthritis    Deceased-donor kidney transplant    Performed at [facility], April 2010.  Initial ESRD due to HTN nephropathy   Eczema    ESRD (end stage renal disease) (HCC)    s/p transplant creatinine baseline 1.1  M/W/F dialysis   FUO (fever of unknown origin) 05/17/2015   GERD (gastroesophageal reflux disease)    Headache(784.0)    History of hyperparathyroidism    Hypertension  Shortness of breath    Wears glasses    Past Surgical History:  Procedure Laterality Date   A/V FISTULAGRAM Left 02/12/2017   Procedure: A/V Fistulagram;  Surgeon: Algernon Huxley, MD;  Location: [place] [facility];  Service: Cardiovascular;  Laterality: Left;   A/V FISTULAGRAM Left 12/30/2017   Procedure: A/V FISTULAGRAM;  Surgeon: Algernon Huxley, MD;  Location: [place] [facility];  Service: Cardiovascular;  Laterality: Left;   A/V SHUNT  INTERVENTION N/A 02/12/2017   Procedure: A/V Shunt Intervention;  Surgeon: Algernon Huxley, MD;  Location: [place] [facility];  Service: Cardiovascular;  Laterality: N/A;   AV FISTULA PLACEMENT     BASCILIC VEIN TRANSPOSITION Left 10/30/2014   Procedure: LEFT [place];  Surgeon: Rosetta Posner, MD;  Location: [place];  Service: Vascular;  Laterality: Left;   [place] Left 01/03/2015   Procedure: LEFT ARM 2ND STAGE [place];  Surgeon: Rosetta Posner, MD;  Location: [place];  Service: Vascular;  Laterality: Left;   FRACTURE SURGERY     left foot,baby toe nad next toe missing   INSERTION OF DIALYSIS CATHETER Right 10/30/2014   Procedure: INSERTION OF DIALYSIS CATHETER;  Surgeon: Rosetta Posner, MD;  Location: [facility] OR;  Service: Vascular;  Laterality: Right;   KIDNEY TRANSPLANT  11/2008   [facility])   WISDOM TOOTH EXTRACTION     Social History   Social History Narrative   Single, 1 daughter   Lives with daughter (born 30) and grandkids   sister helps her with medications etc.   cigarette smoker, rare EtOH, no drugs   family history includes Deep vein thrombosis in her brother; Diabetes in her brother; Hyperlipidemia in her sister; Hypertension in her father, mother, sister, and sister; Kidney disease in her brother.   Review of Systems As per HPI.  She does not appear to have breathing or cardiac issues ongoing.  Data reviewed include for sending his medical care dialysis notes.  On June 8 of this year platelets were normal white count was normal her ferritin was 1500 in April.  Albumin was 3.8 in June.  Hemoglobin was 14.  In July her hemoglobin was 13.5 platelets 151.

## 2019-02-11 DIAGNOSIS — N2589 Other disorders resulting from impaired renal tubular function: Secondary | ICD-10-CM | POA: Diagnosis not present

## 2019-02-11 DIAGNOSIS — N2581 Secondary hyperparathyroidism of renal origin: Secondary | ICD-10-CM | POA: Diagnosis not present

## 2019-02-11 DIAGNOSIS — D509 Iron deficiency anemia, unspecified: Secondary | ICD-10-CM | POA: Diagnosis not present

## 2019-02-11 DIAGNOSIS — Z4932 Encounter for adequacy testing for peritoneal dialysis: Secondary | ICD-10-CM | POA: Diagnosis not present

## 2019-02-11 DIAGNOSIS — N186 End stage renal disease: Secondary | ICD-10-CM | POA: Diagnosis not present

## 2019-02-11 DIAGNOSIS — D631 Anemia in chronic kidney disease: Secondary | ICD-10-CM | POA: Diagnosis not present

## 2019-02-12 DIAGNOSIS — Z4932 Encounter for adequacy testing for peritoneal dialysis: Secondary | ICD-10-CM | POA: Diagnosis not present

## 2019-02-12 DIAGNOSIS — N2589 Other disorders resulting from impaired renal tubular function: Secondary | ICD-10-CM | POA: Diagnosis not present

## 2019-02-12 DIAGNOSIS — N2581 Secondary hyperparathyroidism of renal origin: Secondary | ICD-10-CM | POA: Diagnosis not present

## 2019-02-12 DIAGNOSIS — D631 Anemia in chronic kidney disease: Secondary | ICD-10-CM | POA: Diagnosis not present

## 2019-02-12 DIAGNOSIS — N186 End stage renal disease: Secondary | ICD-10-CM | POA: Diagnosis not present

## 2019-02-12 DIAGNOSIS — D509 Iron deficiency anemia, unspecified: Secondary | ICD-10-CM | POA: Diagnosis not present

## 2019-02-13 DIAGNOSIS — D509 Iron deficiency anemia, unspecified: Secondary | ICD-10-CM | POA: Diagnosis not present

## 2019-02-13 DIAGNOSIS — N186 End stage renal disease: Secondary | ICD-10-CM | POA: Diagnosis not present

## 2019-02-13 DIAGNOSIS — Z4932 Encounter for adequacy testing for peritoneal dialysis: Secondary | ICD-10-CM | POA: Diagnosis not present

## 2019-02-13 DIAGNOSIS — D631 Anemia in chronic kidney disease: Secondary | ICD-10-CM | POA: Diagnosis not present

## 2019-02-13 DIAGNOSIS — N2589 Other disorders resulting from impaired renal tubular function: Secondary | ICD-10-CM | POA: Diagnosis not present

## 2019-02-13 DIAGNOSIS — N2581 Secondary hyperparathyroidism of renal origin: Secondary | ICD-10-CM | POA: Diagnosis not present

## 2019-02-14 ENCOUNTER — Telehealth: Payer: Self-pay | Admitting: Internal Medicine

## 2019-02-14 DIAGNOSIS — D631 Anemia in chronic kidney disease: Secondary | ICD-10-CM | POA: Diagnosis not present

## 2019-02-14 DIAGNOSIS — D509 Iron deficiency anemia, unspecified: Secondary | ICD-10-CM | POA: Diagnosis not present

## 2019-02-14 DIAGNOSIS — N2589 Other disorders resulting from impaired renal tubular function: Secondary | ICD-10-CM | POA: Diagnosis not present

## 2019-02-14 DIAGNOSIS — N186 End stage renal disease: Secondary | ICD-10-CM | POA: Diagnosis not present

## 2019-02-14 DIAGNOSIS — Z4932 Encounter for adequacy testing for peritoneal dialysis: Secondary | ICD-10-CM | POA: Diagnosis not present

## 2019-02-14 DIAGNOSIS — N2581 Secondary hyperparathyroidism of renal origin: Secondary | ICD-10-CM | POA: Diagnosis not present

## 2019-02-14 NOTE — Telephone Encounter (Signed)
I spoke with Shirlee Limerick at the kidney center and they will have Dr Marylou Flesher rx the oral antibotics that Ruth Gutierrez needs prior to her colonoscopy.

## 2019-02-14 NOTE — Telephone Encounter (Signed)
Ruth Gutierrez from [facility] call stating she is returning your call.

## 2019-02-15 DIAGNOSIS — D631 Anemia in chronic kidney disease: Secondary | ICD-10-CM | POA: Diagnosis not present

## 2019-02-15 DIAGNOSIS — N2589 Other disorders resulting from impaired renal tubular function: Secondary | ICD-10-CM | POA: Diagnosis not present

## 2019-02-15 DIAGNOSIS — D509 Iron deficiency anemia, unspecified: Secondary | ICD-10-CM | POA: Diagnosis not present

## 2019-02-15 DIAGNOSIS — N2581 Secondary hyperparathyroidism of renal origin: Secondary | ICD-10-CM | POA: Diagnosis not present

## 2019-02-15 DIAGNOSIS — Z4932 Encounter for adequacy testing for peritoneal dialysis: Secondary | ICD-10-CM | POA: Diagnosis not present

## 2019-02-15 DIAGNOSIS — N186 End stage renal disease: Secondary | ICD-10-CM | POA: Diagnosis not present

## 2019-02-16 DIAGNOSIS — N186 End stage renal disease: Secondary | ICD-10-CM | POA: Diagnosis not present

## 2019-02-16 DIAGNOSIS — N2589 Other disorders resulting from impaired renal tubular function: Secondary | ICD-10-CM | POA: Diagnosis not present

## 2019-02-16 DIAGNOSIS — D631 Anemia in chronic kidney disease: Secondary | ICD-10-CM | POA: Diagnosis not present

## 2019-02-16 DIAGNOSIS — D509 Iron deficiency anemia, unspecified: Secondary | ICD-10-CM | POA: Diagnosis not present

## 2019-02-16 DIAGNOSIS — N2581 Secondary hyperparathyroidism of renal origin: Secondary | ICD-10-CM | POA: Diagnosis not present

## 2019-02-16 DIAGNOSIS — Z4932 Encounter for adequacy testing for peritoneal dialysis: Secondary | ICD-10-CM | POA: Diagnosis not present

## 2019-02-17 DIAGNOSIS — D509 Iron deficiency anemia, unspecified: Secondary | ICD-10-CM | POA: Diagnosis not present

## 2019-02-17 DIAGNOSIS — N2581 Secondary hyperparathyroidism of renal origin: Secondary | ICD-10-CM | POA: Diagnosis not present

## 2019-02-17 DIAGNOSIS — D631 Anemia in chronic kidney disease: Secondary | ICD-10-CM | POA: Diagnosis not present

## 2019-02-17 DIAGNOSIS — N2589 Other disorders resulting from impaired renal tubular function: Secondary | ICD-10-CM | POA: Diagnosis not present

## 2019-02-17 DIAGNOSIS — N186 End stage renal disease: Secondary | ICD-10-CM | POA: Diagnosis not present

## 2019-02-17 DIAGNOSIS — Z4932 Encounter for adequacy testing for peritoneal dialysis: Secondary | ICD-10-CM | POA: Diagnosis not present

## 2019-02-18 DIAGNOSIS — D631 Anemia in chronic kidney disease: Secondary | ICD-10-CM | POA: Diagnosis not present

## 2019-02-18 DIAGNOSIS — N2581 Secondary hyperparathyroidism of renal origin: Secondary | ICD-10-CM | POA: Diagnosis not present

## 2019-02-18 DIAGNOSIS — D509 Iron deficiency anemia, unspecified: Secondary | ICD-10-CM | POA: Diagnosis not present

## 2019-02-18 DIAGNOSIS — Z4932 Encounter for adequacy testing for peritoneal dialysis: Secondary | ICD-10-CM | POA: Diagnosis not present

## 2019-02-18 DIAGNOSIS — N2589 Other disorders resulting from impaired renal tubular function: Secondary | ICD-10-CM | POA: Diagnosis not present

## 2019-02-18 DIAGNOSIS — N186 End stage renal disease: Secondary | ICD-10-CM | POA: Diagnosis not present

## 2019-02-19 DIAGNOSIS — D631 Anemia in chronic kidney disease: Secondary | ICD-10-CM | POA: Diagnosis not present

## 2019-02-19 DIAGNOSIS — N186 End stage renal disease: Secondary | ICD-10-CM | POA: Diagnosis not present

## 2019-02-19 DIAGNOSIS — N2581 Secondary hyperparathyroidism of renal origin: Secondary | ICD-10-CM | POA: Diagnosis not present

## 2019-02-19 DIAGNOSIS — Z4932 Encounter for adequacy testing for peritoneal dialysis: Secondary | ICD-10-CM | POA: Diagnosis not present

## 2019-02-19 DIAGNOSIS — D509 Iron deficiency anemia, unspecified: Secondary | ICD-10-CM | POA: Diagnosis not present

## 2019-02-19 DIAGNOSIS — N2589 Other disorders resulting from impaired renal tubular function: Secondary | ICD-10-CM | POA: Diagnosis not present

## 2019-02-20 DIAGNOSIS — N186 End stage renal disease: Secondary | ICD-10-CM | POA: Diagnosis not present

## 2019-02-20 DIAGNOSIS — D509 Iron deficiency anemia, unspecified: Secondary | ICD-10-CM | POA: Diagnosis not present

## 2019-02-20 DIAGNOSIS — D631 Anemia in chronic kidney disease: Secondary | ICD-10-CM | POA: Diagnosis not present

## 2019-02-20 DIAGNOSIS — N2589 Other disorders resulting from impaired renal tubular function: Secondary | ICD-10-CM | POA: Diagnosis not present

## 2019-02-20 DIAGNOSIS — Z4932 Encounter for adequacy testing for peritoneal dialysis: Secondary | ICD-10-CM | POA: Diagnosis not present

## 2019-02-20 DIAGNOSIS — N2581 Secondary hyperparathyroidism of renal origin: Secondary | ICD-10-CM | POA: Diagnosis not present

## 2019-02-21 DIAGNOSIS — D631 Anemia in chronic kidney disease: Secondary | ICD-10-CM | POA: Diagnosis not present

## 2019-02-21 DIAGNOSIS — N2589 Other disorders resulting from impaired renal tubular function: Secondary | ICD-10-CM | POA: Diagnosis not present

## 2019-02-21 DIAGNOSIS — Z4932 Encounter for adequacy testing for peritoneal dialysis: Secondary | ICD-10-CM | POA: Diagnosis not present

## 2019-02-21 DIAGNOSIS — D509 Iron deficiency anemia, unspecified: Secondary | ICD-10-CM | POA: Diagnosis not present

## 2019-02-21 DIAGNOSIS — N2581 Secondary hyperparathyroidism of renal origin: Secondary | ICD-10-CM | POA: Diagnosis not present

## 2019-02-21 DIAGNOSIS — N186 End stage renal disease: Secondary | ICD-10-CM | POA: Diagnosis not present

## 2019-02-22 ENCOUNTER — Telehealth: Payer: Self-pay | Admitting: Internal Medicine

## 2019-02-22 DIAGNOSIS — N2581 Secondary hyperparathyroidism of renal origin: Secondary | ICD-10-CM | POA: Diagnosis not present

## 2019-02-22 DIAGNOSIS — D509 Iron deficiency anemia, unspecified: Secondary | ICD-10-CM | POA: Diagnosis not present

## 2019-02-22 DIAGNOSIS — N2589 Other disorders resulting from impaired renal tubular function: Secondary | ICD-10-CM | POA: Diagnosis not present

## 2019-02-22 DIAGNOSIS — Z4932 Encounter for adequacy testing for peritoneal dialysis: Secondary | ICD-10-CM | POA: Diagnosis not present

## 2019-02-22 DIAGNOSIS — D631 Anemia in chronic kidney disease: Secondary | ICD-10-CM | POA: Diagnosis not present

## 2019-02-22 DIAGNOSIS — N186 End stage renal disease: Secondary | ICD-10-CM | POA: Diagnosis not present

## 2019-02-22 NOTE — Telephone Encounter (Signed)

## 2019-02-23 ENCOUNTER — Other Ambulatory Visit: Payer: Self-pay

## 2019-02-23 ENCOUNTER — Encounter: Payer: Self-pay | Admitting: Internal Medicine

## 2019-02-23 ENCOUNTER — Ambulatory Visit (AMBULATORY_SURGERY_CENTER): Payer: Medicare Other | Admitting: Internal Medicine

## 2019-02-23 VITALS — BP 134/69 | HR 109 | Temp 98.9°F | Resp 18 | Ht 67.0 in | Wt 180.0 lb

## 2019-02-23 DIAGNOSIS — D128 Benign neoplasm of rectum: Secondary | ICD-10-CM

## 2019-02-23 DIAGNOSIS — D125 Benign neoplasm of sigmoid colon: Secondary | ICD-10-CM

## 2019-02-23 DIAGNOSIS — Z4932 Encounter for adequacy testing for peritoneal dialysis: Secondary | ICD-10-CM | POA: Diagnosis not present

## 2019-02-23 DIAGNOSIS — D123 Benign neoplasm of transverse colon: Secondary | ICD-10-CM

## 2019-02-23 DIAGNOSIS — Z1211 Encounter for screening for malignant neoplasm of colon: Secondary | ICD-10-CM

## 2019-02-23 DIAGNOSIS — K635 Polyp of colon: Secondary | ICD-10-CM | POA: Diagnosis not present

## 2019-02-23 DIAGNOSIS — D509 Iron deficiency anemia, unspecified: Secondary | ICD-10-CM | POA: Diagnosis not present

## 2019-02-23 DIAGNOSIS — I10 Essential (primary) hypertension: Secondary | ICD-10-CM | POA: Diagnosis not present

## 2019-02-23 DIAGNOSIS — N186 End stage renal disease: Secondary | ICD-10-CM | POA: Diagnosis not present

## 2019-02-23 DIAGNOSIS — D631 Anemia in chronic kidney disease: Secondary | ICD-10-CM | POA: Diagnosis not present

## 2019-02-23 DIAGNOSIS — N2581 Secondary hyperparathyroidism of renal origin: Secondary | ICD-10-CM | POA: Diagnosis not present

## 2019-02-23 DIAGNOSIS — N2589 Other disorders resulting from impaired renal tubular function: Secondary | ICD-10-CM | POA: Diagnosis not present

## 2019-02-23 MED ORDER — SODIUM CHLORIDE 0.9 % IV SOLN: 500.0000 mL | Freq: Once | INTRAVENOUS | Status: DC

## 2019-02-23 NOTE — Op Note (Signed)
[facility] Patient Name: Ruth Gutierrez Procedure Date: 02/23/2019 8:30 AM MRN: [id number] Endoscopist: Gatha Mayer , MD Age: 53 Referring MD:  Date of Birth: [birth year] Gender: Female Account #: [id number] Procedure:                Colonoscopy Indications:              Screening for colorectal malignant neoplasm, This                            is the patient's first colonoscopy Medicines:                Propofol per Anesthesia, Monitored Anesthesia Care Procedure:                Pre-Anesthesia Assessment:                           - Prior to the procedure, a History and Physical                            was performed, and patient medications and                            allergies were reviewed. The patient's tolerance of                            previous anesthesia was also reviewed. The risks                            and benefits of the procedure and the sedation                            options and risks were discussed with the patient.                            All questions were answered, and informed consent                            was obtained. Prior Anticoagulants: The patient has                            taken no previous anticoagulant or antiplatelet                            agents. ASA Grade Assessment: III - A patient with                            severe systemic disease. After reviewing the risks                            and benefits, the patient was deemed in                            satisfactory condition to undergo the procedure.  After obtaining informed consent, the colonoscope                            was passed under direct vision. Throughout the                            procedure, the patient's blood pressure, pulse, and                            oxygen saturations were monitored continuously. The                            Colonoscope was introduced through the anus and        advanced to the the cecum, identified by                            appendiceal orifice and ileocecal valve. The                            colonoscopy was performed without difficulty. The                            patient tolerated the procedure well. The quality                            of the bowel preparation was good. The bowel                            preparation used was Miralax via split dose                            instruction. The ileocecal valve, appendiceal                            orifice, and rectum were photographed. Scope In: 8:38:38 AM Scope Out: 8:58:32 AM Scope Withdrawal Time: 0 hours 16 minutes 54 seconds  Total Procedure Duration: 0 hours 19 minutes 54 seconds  Findings:                 The perianal and digital rectal examinations were                            normal.                           Nine flat and sessile polyps were found in the                            rectum, sigmoid colon and transverse colon. The                            polyps were diminutive in size. These polyps were                            removed  with a cold snare. Resection and retrieval                            were complete. Verification of patient                            identification for the specimen was done. Estimated                            blood loss was minimal.                           A diffuse area of moderately melanotic mucosa was                            found in the entire colon.                           The exam was otherwise without abnormality on                            direct and retroflexion views. Complications:            No immediate complications. Estimated Blood Loss:     Estimated blood loss was minimal. Impression:               - Nine diminutive polyps in the rectum, in the                            sigmoid colon and in the transverse colon, removed                            with a cold snare. Resected and retrieved.                            - Melanotic mucosa in the entire examined colon.                           - The examination was otherwise normal on direct                            and retroflexion views. Recommendation:           - Patient has a contact number available for                            emergencies. The signs and symptoms of potential                            delayed complications were discussed with the                            patient. Return to normal activities tomorrow.                            Written  discharge instructions were provided to the                            patient.                           - Resume previous diet.                           - Continue present medications.                           - Repeat colonoscopy is recommended. The                            colonoscopy date will be determined after pathology                            results from today's exam become available for                            review. Gatha Mayer, MD 02/23/2019 9:08:10 AM This report has been signed electronically.

## 2019-02-23 NOTE — Patient Instructions (Addendum)
I found and removed 9 tiny polyps. No signs of cancer. I will let you know pathology results and when to have another routine colonoscopy by mail and/or My Chart.  You also have a color change in the lining of the colon called melanosis coli. This is not a problem and usually is caused by prior use of certain laxatives.  Nothing here should stand in the way of getting a kidney transplant. Best of luck in that endeavor! I appreciate the opportunity to care for you. Gatha Mayer, MD, FACG YOU HAD AN ENDOSCOPIC PROCEDURE TODAY AT [facility]:   Refer to the procedure report that was given to you for any specific questions about what was found during the examination.  If the procedure report does not answer your questions, please call your gastroenterologist to clarify.  If you requested that your care partner not be given the details of your procedure findings, then the procedure report has been included in a sealed envelope for you to review at your convenience later.  YOU SHOULD EXPECT: Some feelings of bloating in the abdomen. Passage of more gas than usual.  Walking can help get rid of the air that was put into your GI tract during the procedure and reduce the bloating. If you had a lower endoscopy (such as a colonoscopy or flexible sigmoidoscopy) you may notice spotting of blood in your stool or on the toilet paper. If you underwent a bowel prep for your procedure, you may not have a normal bowel movement for a few days.  Please Note:  You might notice some irritation and congestion in your nose or some drainage.  This is from the oxygen used during your procedure.  There is no need for concern and it should clear up in a day or so.  SYMPTOMS TO REPORT IMMEDIATELY:   Following lower endoscopy (colonoscopy or flexible sigmoidoscopy):  Excessive amounts of blood in the stool  Significant tenderness or worsening of abdominal pains  Swelling of the abdomen that is new,  acute  Fever of 100F or higher  For urgent or emergent issues, a gastroenterologist can be reached at any hour by calling [phone].   DIET:  We do recommend a small meal at first, but then you may proceed to your regular diet.  Drink plenty of fluids but you should avoid alcoholic beverages for 24 hours.  MEDICATIONS: Continue present medications.  Please see handouts given to you by your recovery nurse.  ACTIVITY:  You should plan to take it easy for the rest of today and you should NOT DRIVE or use heavy machinery until tomorrow (because of the sedation medicines used during the test).    FOLLOW UP: Our staff will call the number listed on your records 48-72 hours following your procedure to check on you and address any questions or concerns that you may have regarding the information given to you following your procedure. If we do not reach you, we will leave a message.  We will attempt to reach you two times.  During this call, we will ask if you have developed any symptoms of COVID 19. If you develop any symptoms (ie: fever, flu-like symptoms, shortness of breath, cough etc.) before then, please call [phone].  If you test positive for Covid 19 in the 2 weeks post procedure, please call and report this information to [place].    If any biopsies were taken you will be contacted by phone or by letter within  the next 1-3 weeks.  Please call us at [phone] if you have not heard about the biopsies in 3 weeks.   Thank you for allowing [place] to provide for your healthcare needs today.   SIGNATURES/CONFIDENTIALITY: You and/or your care partner have signed paperwork which will be entered into your electronic medical record.  These signatures attest to the fact that that the information above on your After Visit Summary has been reviewed and is understood.  Full responsibility of the confidentiality of this discharge information lies with you and/or your care-partner.

## 2019-02-23 NOTE — Progress Notes (Signed)
Temps by CW VS by SB

## 2019-02-23 NOTE — Progress Notes (Signed)
Report given to PACU, vss 

## 2019-02-24 DIAGNOSIS — N2589 Other disorders resulting from impaired renal tubular function: Secondary | ICD-10-CM | POA: Diagnosis not present

## 2019-02-24 DIAGNOSIS — D631 Anemia in chronic kidney disease: Secondary | ICD-10-CM | POA: Diagnosis not present

## 2019-02-24 DIAGNOSIS — N2581 Secondary hyperparathyroidism of renal origin: Secondary | ICD-10-CM | POA: Diagnosis not present

## 2019-02-24 DIAGNOSIS — N186 End stage renal disease: Secondary | ICD-10-CM | POA: Diagnosis not present

## 2019-02-24 DIAGNOSIS — D509 Iron deficiency anemia, unspecified: Secondary | ICD-10-CM | POA: Diagnosis not present

## 2019-02-24 DIAGNOSIS — Z4932 Encounter for adequacy testing for peritoneal dialysis: Secondary | ICD-10-CM | POA: Diagnosis not present

## 2019-02-25 ENCOUNTER — Telehealth: Payer: Self-pay

## 2019-02-25 DIAGNOSIS — N2581 Secondary hyperparathyroidism of renal origin: Secondary | ICD-10-CM | POA: Diagnosis not present

## 2019-02-25 DIAGNOSIS — D631 Anemia in chronic kidney disease: Secondary | ICD-10-CM | POA: Diagnosis not present

## 2019-02-25 DIAGNOSIS — Z01419 Encounter for gynecological examination (general) (routine) without abnormal findings: Secondary | ICD-10-CM | POA: Diagnosis not present

## 2019-02-25 DIAGNOSIS — Z4932 Encounter for adequacy testing for peritoneal dialysis: Secondary | ICD-10-CM | POA: Diagnosis not present

## 2019-02-25 DIAGNOSIS — D509 Iron deficiency anemia, unspecified: Secondary | ICD-10-CM | POA: Diagnosis not present

## 2019-02-25 DIAGNOSIS — Z992 Dependence on renal dialysis: Secondary | ICD-10-CM | POA: Diagnosis not present

## 2019-02-25 DIAGNOSIS — Z1151 Encounter for screening for human papillomavirus (HPV): Secondary | ICD-10-CM | POA: Diagnosis not present

## 2019-02-25 DIAGNOSIS — Z124 Encounter for screening for malignant neoplasm of cervix: Secondary | ICD-10-CM | POA: Diagnosis not present

## 2019-02-25 DIAGNOSIS — N2589 Other disorders resulting from impaired renal tubular function: Secondary | ICD-10-CM | POA: Diagnosis not present

## 2019-02-25 DIAGNOSIS — N186 End stage renal disease: Secondary | ICD-10-CM | POA: Diagnosis not present

## 2019-02-25 NOTE — Telephone Encounter (Signed)
  Follow up Call-  Call back number 02/23/2019  Post procedure Call Back phone  # [phone]  Permission to leave phone message Yes  Some recent data might be hidden     Patient questions:  Do you have a fever, pain , or abdominal swelling? No. Pain Score  0 *  Have you tolerated food without any problems? Yes.    Have you been able to return to your normal activities? Yes.    Do you have any questions about your discharge instructions: Diet   No. Medications  No. Follow up visit  No.  Do you have questions or concerns about your Care? No.  Actions: * If pain score is 4 or above: No action needed, pain <4.  1. Have you developed a fever since your procedure? No  2.   Have you had an respiratory symptoms (SOB or cough) since your procedure? No  3.   Have you tested positive for COVID 19 since your procedure No  4.   Have you had any family members/close contacts diagnosed with the COVID 19 since your procedure? No   If yes to any of these questions please route to Joylene John, RN and Alphonsa Gin, RN.

## 2019-02-26 DIAGNOSIS — Z4932 Encounter for adequacy testing for peritoneal dialysis: Secondary | ICD-10-CM | POA: Diagnosis not present

## 2019-02-26 DIAGNOSIS — D631 Anemia in chronic kidney disease: Secondary | ICD-10-CM | POA: Diagnosis not present

## 2019-02-26 DIAGNOSIS — N186 End stage renal disease: Secondary | ICD-10-CM | POA: Diagnosis not present

## 2019-02-26 DIAGNOSIS — N2581 Secondary hyperparathyroidism of renal origin: Secondary | ICD-10-CM | POA: Diagnosis not present

## 2019-02-26 DIAGNOSIS — D509 Iron deficiency anemia, unspecified: Secondary | ICD-10-CM | POA: Diagnosis not present

## 2019-02-26 DIAGNOSIS — N2589 Other disorders resulting from impaired renal tubular function: Secondary | ICD-10-CM | POA: Diagnosis not present

## 2019-02-27 ENCOUNTER — Encounter: Payer: Self-pay | Admitting: Internal Medicine

## 2019-02-27 DIAGNOSIS — D509 Iron deficiency anemia, unspecified: Secondary | ICD-10-CM | POA: Diagnosis not present

## 2019-02-27 DIAGNOSIS — D631 Anemia in chronic kidney disease: Secondary | ICD-10-CM | POA: Diagnosis not present

## 2019-02-27 DIAGNOSIS — N2589 Other disorders resulting from impaired renal tubular function: Secondary | ICD-10-CM | POA: Diagnosis not present

## 2019-02-27 DIAGNOSIS — N2581 Secondary hyperparathyroidism of renal origin: Secondary | ICD-10-CM | POA: Diagnosis not present

## 2019-02-27 DIAGNOSIS — Z4932 Encounter for adequacy testing for peritoneal dialysis: Secondary | ICD-10-CM | POA: Diagnosis not present

## 2019-02-27 DIAGNOSIS — N186 End stage renal disease: Secondary | ICD-10-CM | POA: Diagnosis not present

## 2019-02-27 NOTE — Progress Notes (Signed)
Mix of ssp, adenoma and hyperplastic # 9 total Recall 2023 My Chart

## 2019-02-28 DIAGNOSIS — D509 Iron deficiency anemia, unspecified: Secondary | ICD-10-CM | POA: Diagnosis not present

## 2019-02-28 DIAGNOSIS — N2589 Other disorders resulting from impaired renal tubular function: Secondary | ICD-10-CM | POA: Diagnosis not present

## 2019-02-28 DIAGNOSIS — N2581 Secondary hyperparathyroidism of renal origin: Secondary | ICD-10-CM | POA: Diagnosis not present

## 2019-02-28 DIAGNOSIS — D631 Anemia in chronic kidney disease: Secondary | ICD-10-CM | POA: Diagnosis not present

## 2019-02-28 DIAGNOSIS — N186 End stage renal disease: Secondary | ICD-10-CM | POA: Diagnosis not present

## 2019-02-28 DIAGNOSIS — Z4932 Encounter for adequacy testing for peritoneal dialysis: Secondary | ICD-10-CM | POA: Diagnosis not present

## 2019-03-01 ENCOUNTER — Encounter (HOSPITAL_COMMUNITY): Payer: Self-pay | Admitting: Emergency Medicine

## 2019-03-01 ENCOUNTER — Emergency Department (HOSPITAL_COMMUNITY)
Admission: EM | Admit: 2019-03-01 | Discharge: 2019-03-02 | Disposition: A | Discharge status: Home / Self Care | Payer: Medicare Other | Attending: Emergency Medicine | Admitting: Emergency Medicine

## 2019-03-01 ENCOUNTER — Other Ambulatory Visit: Payer: Self-pay

## 2019-03-01 DIAGNOSIS — N186 End stage renal disease: Secondary | ICD-10-CM | POA: Insufficient documentation

## 2019-03-01 DIAGNOSIS — F17210 Nicotine dependence, cigarettes, uncomplicated: Secondary | ICD-10-CM | POA: Insufficient documentation

## 2019-03-01 DIAGNOSIS — N2581 Secondary hyperparathyroidism of renal origin: Secondary | ICD-10-CM | POA: Diagnosis not present

## 2019-03-01 DIAGNOSIS — I120 Hypertensive chronic kidney disease with stage 5 chronic kidney disease or end stage renal disease: Secondary | ICD-10-CM | POA: Insufficient documentation

## 2019-03-01 DIAGNOSIS — R0602 Shortness of breath: Secondary | ICD-10-CM | POA: Diagnosis not present

## 2019-03-01 DIAGNOSIS — Z7982 Long term (current) use of aspirin: Secondary | ICD-10-CM | POA: Insufficient documentation

## 2019-03-01 DIAGNOSIS — R079 Chest pain, unspecified: Secondary | ICD-10-CM | POA: Diagnosis not present

## 2019-03-01 DIAGNOSIS — R000 Tachycardia, unspecified: Secondary | ICD-10-CM

## 2019-03-01 DIAGNOSIS — E876 Hypokalemia: Secondary | ICD-10-CM | POA: Diagnosis not present

## 2019-03-01 DIAGNOSIS — Z992 Dependence on renal dialysis: Secondary | ICD-10-CM | POA: Diagnosis not present

## 2019-03-01 DIAGNOSIS — R42 Dizziness and giddiness: Secondary | ICD-10-CM | POA: Insufficient documentation

## 2019-03-01 DIAGNOSIS — R0789 Other chest pain: Secondary | ICD-10-CM | POA: Insufficient documentation

## 2019-03-01 DIAGNOSIS — Z79899 Other long term (current) drug therapy: Secondary | ICD-10-CM | POA: Diagnosis not present

## 2019-03-01 DIAGNOSIS — N2589 Other disorders resulting from impaired renal tubular function: Secondary | ICD-10-CM | POA: Diagnosis not present

## 2019-03-01 DIAGNOSIS — M62838 Other muscle spasm: Secondary | ICD-10-CM | POA: Diagnosis not present

## 2019-03-01 DIAGNOSIS — Z4932 Encounter for adequacy testing for peritoneal dialysis: Secondary | ICD-10-CM | POA: Diagnosis not present

## 2019-03-01 DIAGNOSIS — Z940 Kidney transplant status: Secondary | ICD-10-CM | POA: Insufficient documentation

## 2019-03-01 DIAGNOSIS — E860 Dehydration: Secondary | ICD-10-CM

## 2019-03-01 DIAGNOSIS — D509 Iron deficiency anemia, unspecified: Secondary | ICD-10-CM | POA: Diagnosis not present

## 2019-03-01 DIAGNOSIS — D631 Anemia in chronic kidney disease: Secondary | ICD-10-CM | POA: Diagnosis not present

## 2019-03-01 DIAGNOSIS — R531 Weakness: Secondary | ICD-10-CM | POA: Diagnosis not present

## 2019-03-01 LAB — BASIC METABOLIC PANEL
Anion gap: 19 — ABNORMAL HIGH (ref 5–15)
BUN: 49 mg/dL — ABNORMAL HIGH (ref 6–20)
CO2: 23 mmol/L (ref 22–32)
Calcium: 10.2 mg/dL (ref 8.9–10.3)
Chloride: 91 mmol/L — ABNORMAL LOW (ref 98–111)
Creatinine, Ser: 20.44 mg/dL — ABNORMAL HIGH (ref 0.44–1.00)
GFR calc Af Amer: 2 mL/min — ABNORMAL LOW (ref >60)
GFR calc non Af Amer: 2 mL/min — ABNORMAL LOW (ref >60)
Glucose, Bld: 80 mg/dL (ref 70–99)
Potassium: 3 mmol/L — ABNORMAL LOW (ref 3.5–5.1)
Sodium: 133 mmol/L — ABNORMAL LOW (ref 135–145)

## 2019-03-01 LAB — CBC
HCT: 46.3 % — ABNORMAL HIGH (ref 36.0–46.0)
Hemoglobin: 15.4 g/dL — ABNORMAL HIGH (ref 12.0–15.0)
MCH: 32.2 pg (ref 26.0–34.0)
MCHC: 33.3 g/dL (ref 30.0–36.0)
MCV: 96.9 fL (ref 80.0–100.0)
Platelets: 205 10*3/uL (ref 150–400)
RBC: 4.78 MIL/uL (ref 3.87–5.11)
RDW: 12.7 % (ref 11.5–15.5)
WBC: 9.5 10*3/uL (ref 4.0–10.5)
nRBC: 0 % (ref 0.0–0.2)

## 2019-03-01 LAB — I-STAT BETA HCG BLOOD, ED (MC, WL, AP ONLY): I-stat hCG, quantitative: 18.1 m[IU]/mL — ABNORMAL HIGH (ref <5)

## 2019-03-01 MED ORDER — SODIUM CHLORIDE 0.9% FLUSH: 3.0000 mL | Freq: Once | INTRAVENOUS | Status: DC

## 2019-03-01 NOTE — ED Triage Notes (Signed)
Pt reports dizziness, sob upon exertion and weakness for two weeks.  Substernal chest pain tightness increases when laying.  Sent by urgent care

## 2019-03-02 ENCOUNTER — Emergency Department (HOSPITAL_COMMUNITY): Payer: Medicare Other

## 2019-03-02 DIAGNOSIS — R0602 Shortness of breath: Secondary | ICD-10-CM | POA: Diagnosis not present

## 2019-03-02 DIAGNOSIS — Z4932 Encounter for adequacy testing for peritoneal dialysis: Secondary | ICD-10-CM | POA: Diagnosis not present

## 2019-03-02 DIAGNOSIS — N186 End stage renal disease: Secondary | ICD-10-CM | POA: Diagnosis not present

## 2019-03-02 DIAGNOSIS — D631 Anemia in chronic kidney disease: Secondary | ICD-10-CM | POA: Diagnosis not present

## 2019-03-02 DIAGNOSIS — R42 Dizziness and giddiness: Secondary | ICD-10-CM | POA: Diagnosis not present

## 2019-03-02 DIAGNOSIS — N2589 Other disorders resulting from impaired renal tubular function: Secondary | ICD-10-CM | POA: Diagnosis not present

## 2019-03-02 DIAGNOSIS — N2581 Secondary hyperparathyroidism of renal origin: Secondary | ICD-10-CM | POA: Diagnosis not present

## 2019-03-02 DIAGNOSIS — R079 Chest pain, unspecified: Secondary | ICD-10-CM | POA: Diagnosis not present

## 2019-03-02 DIAGNOSIS — D509 Iron deficiency anemia, unspecified: Secondary | ICD-10-CM | POA: Diagnosis not present

## 2019-03-02 LAB — TROPONIN I (HIGH SENSITIVITY)
Troponin I (High Sensitivity): 11 ng/L (ref <18)
Troponin I (High Sensitivity): 11 ng/L (ref <18)

## 2019-03-02 IMAGING — DX PORTABLE CHEST - 1 VIEW
1 series · 1 of 1 positions shown · non-contrast
Comparison: [DATE]

CLINICAL DATA: Chest pain and shortness of breath for 2 weeks.
Dizziness.

EXAM:
PORTABLE CHEST 1 VIEW

[chest ap]
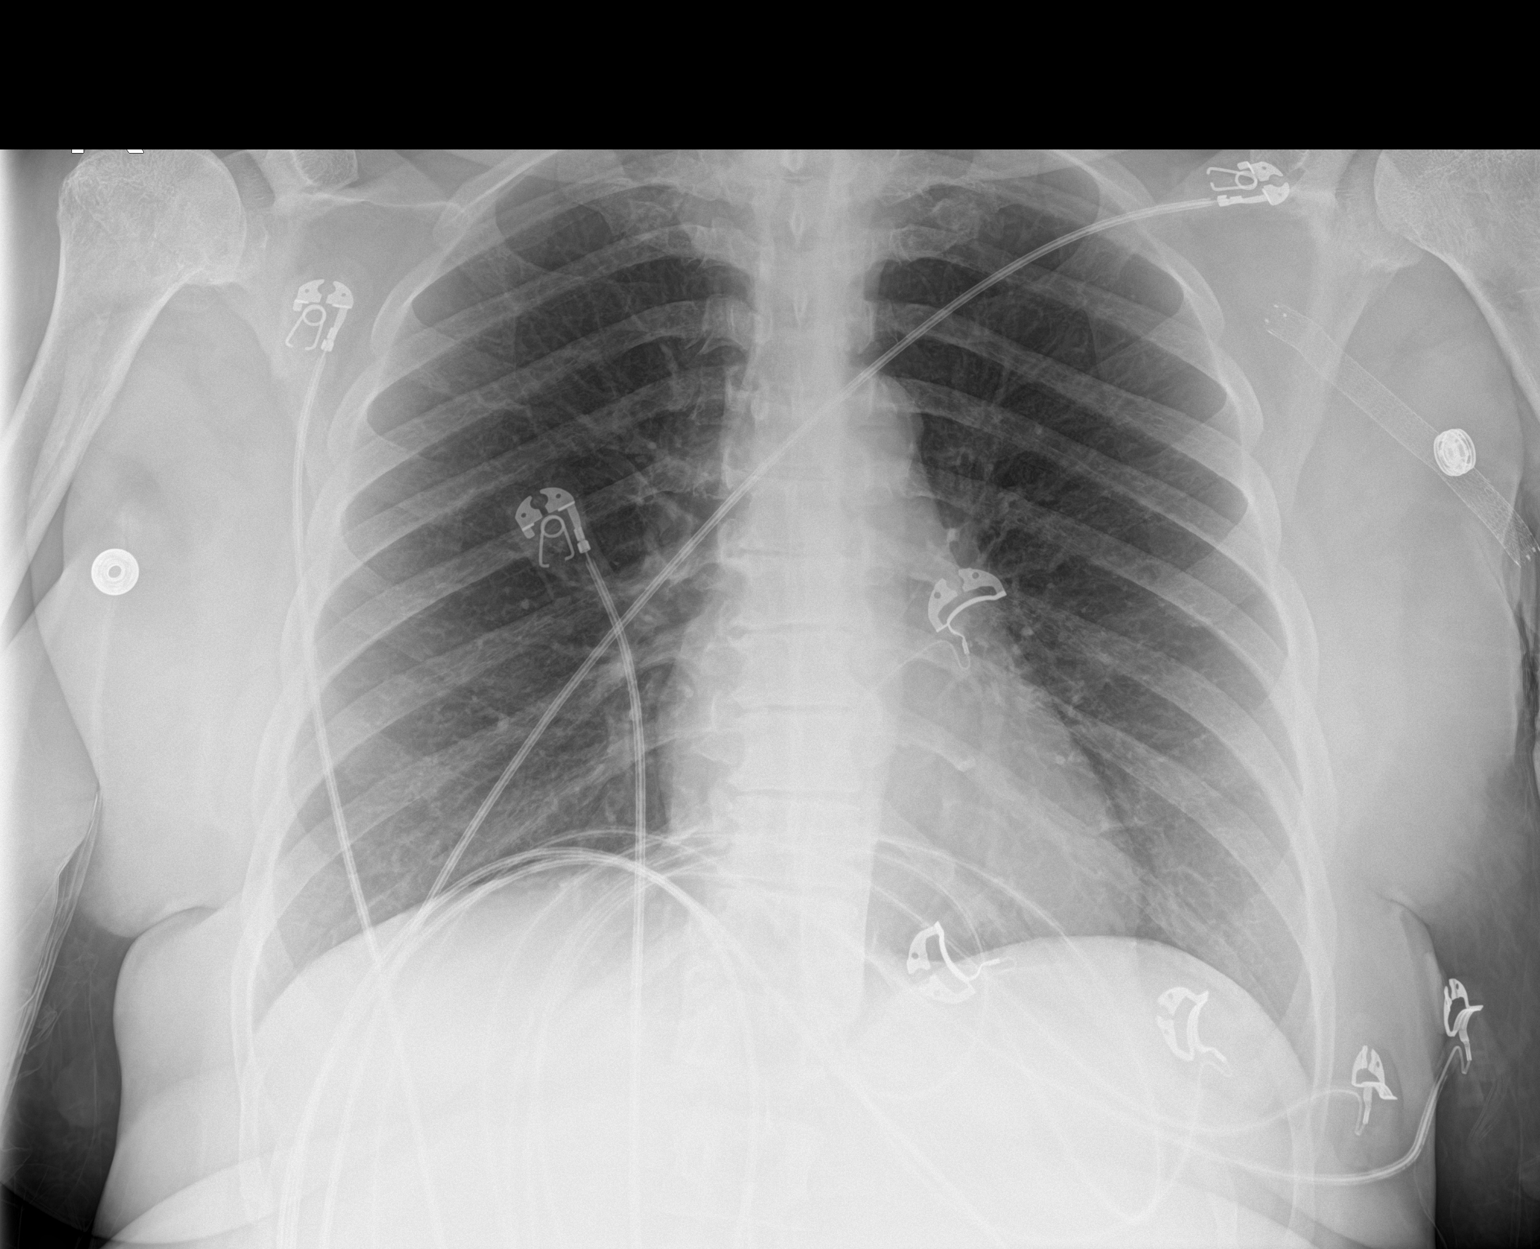

[1 of 1 positions shown; findings below may reference images not displayed]

FINDINGS: The heart size and mediastinal contours are within normal limits.
Both lungs are clear. Endovascular stent noted in the left axillary
region.
IMPRESSION: No active disease.

## 2019-03-02 MED ORDER — ASPIRIN 81 MG PO CHEW
324.0000 mg | CHEWABLE_TABLET | Freq: Once | ORAL | Status: AC
  Administered 2019-03-02: 324 mg via ORAL
  Filled 2019-03-02: qty 4

## 2019-03-02 MED ORDER — SODIUM CHLORIDE 0.9 % IV BOLUS (SEPSIS)
500.0000 mL | Freq: Once | INTRAVENOUS | Status: AC
  Administered 2019-03-02: 500 mL via INTRAVENOUS

## 2019-03-02 NOTE — ED Provider Notes (Signed)
Care was taken over from Dr. Christy Gentles.  Patient had initially presented with some chest pain.  She currently is chest pain-free.  There is no exertional symptoms.  She denies any current shortness of breath.  Her EKG does not show any ischemic changes.  She has had 2- troponins.  She has had some lightheadedness and fatigue, mostly when she stands up.  She does do peritoneal dialysis since possible she is taking off too much fluid.  She was given a small fluid bolus here and feels much better.  She is able to ambulate without symptoms.  She was discharged home in good condition.  She is actually going over to her nephrologist office following discharge.  Return precautions were given.  She has had some mild tachycardia.  Her heart rate on my reexam was 94.  It will bump up at times to the low 100s.  It looks like on her most recent doctor's office visits, her heart rate was in the low 100s and upper 90s.   Malvin Johns, MD 03/02/19 1116

## 2019-03-02 NOTE — ED Provider Notes (Signed)
[facility] EMERGENCY DEPARTMENT Provider Note   CSN: [id number] Arrival date & time: 03/01/19  2001    History   Chief Complaint Chief Complaint  Patient presents with  . Dizziness  . Shortness of Breath    HPI Ruth Gutierrez is a 53 y.o. female.  HPI: A 53 year old patient with a history of hypertension presents for evaluation of chest pain. Initial onset of pain was approximately 1-3 hours ago. The patient's chest pain is described as heaviness/pressure/tightness and is not worse with exertion. The patient complains of nausea. The patient's chest pain is middle- or left-sided, is not well-localized, is not sharp and does not radiate to the arms/jaw/neck. The patient denies diaphoresis. The patient has smoked in the past 90 days. The patient has no history of stroke, has no history of peripheral artery disease, denies any history of treated diabetes, has no relevant family history of coronary artery disease (first degree relative at less than age 31), has no history of hypercholesterolemia and does not have an elevated BMI (>=30).   The history is provided by the patient and a relative.  Patient reports for the past 4 weeks she has been having dizziness and fatigue.  It seems worse when she stands up.  No LOC.  No fever/vomiting/diarrhea.  No new medicines.  No new abdominal pain.  She has mild cough.  She reports while waiting in the waiting room she did have some chest pain and pressure for 30 minutes.  She reports shortness of breath She is on peritoneal dialysis.  No new abdominal pain.  No fever, no change in peritoneal fluid  Past Medical History:  Diagnosis Date  . Anemia of chronic disease   . Arthritis   . Deceased-donor kidney transplant    Performed at [facility], April 2010.  Initial ESRD due to HTN nephropathy  . Eczema   . ESRD (end stage renal disease) ([place])    s/p transplant creatinine baseline 1.1  M/W/F dialysis  . FUO (fever of unknown  origin) 05/17/2015  . GERD (gastroesophageal reflux disease)   . Headache(784.0)   . History of hyperparathyroidism   . Hypertension   . Shortness of breath   . Wears glasses     Patient Active Problem List   Diagnosis Date Noted  . Renal dialysis device, implant, or graft complication 44/08/270  . ESRD on dialysis ([place])   . HCAP (healthcare-associated pneumonia)   . Sepsis ([place]) 11/12/2015  . Pneumonia 11/12/2015  . FUO (fever of unknown origin) 05/17/2015  . Acute renal failure ([place]) 04/05/2013  . Diarrhea 04/05/2013  . Acute pyelonephritis 02/02/2013  . Viral gastroenteritis 02/02/2013  . Sepsis due to Klebsiella pneumoniae ([place]) 06/02/2011  . Fatigue 06/02/2011  . Tobacco abuse 06/02/2011  . Iron deficiency anemia 04/04/2011  . Hypertension 04/04/2011  . S/P kidney transplant 04/04/2011  . Clotted renal dialysis AV graft ([place]) 04/04/2011  . Fibroids 04/04/2011  . Preventative health care 04/04/2011    Past Surgical History:  Procedure Laterality Date  . A/V FISTULAGRAM Left 02/12/2017   Procedure: A/V Fistulagram;  Surgeon: Algernon Huxley, MD;  Location: [facility];  Service: Cardiovascular;  Laterality: Left;  . A/V FISTULAGRAM Left 12/30/2017   Procedure: A/V FISTULAGRAM;  Surgeon: Algernon Huxley, MD;  Location: [place];  Service: Cardiovascular;  Laterality: Left;  . A/V SHUNT INTERVENTION N/A 02/12/2017   Procedure: A/V Shunt Intervention;  Surgeon: Algernon Huxley, MD;  Location: [facility]  CV [facility];  Service: Cardiovascular;  Laterality: N/A;  . AV FISTULA PLACEMENT    . BASCILIC VEIN TRANSPOSITION Left 10/30/2014   Procedure: LEFT BASCILIC VEIN TRANSPOSITION;  Surgeon: Rosetta Posner, MD;  Location: [facility];  Service: Vascular;  Laterality: Left;  . BASCILIC VEIN TRANSPOSITION Left 01/03/2015   Procedure: LEFT ARM 2ND STAGE BASCILIC VEIN TRANSPOSITION;  Surgeon: Rosetta Posner, MD;  Location: [facility];  Service: Vascular;  Laterality: Left;  . FRACTURE  SURGERY     left foot,baby toe nad next toe missing  . INSERTION OF DIALYSIS CATHETER Right 10/30/2014   Procedure: INSERTION OF DIALYSIS CATHETER;  Surgeon: Rosetta Posner, MD;  Location: [facility];  Service: Vascular;  Laterality: Right;  . KIDNEY TRANSPLANT  11/2008   [facility])  . WISDOM TOOTH EXTRACTION       OB History   No obstetric history on file.      Home Medications    Prior to Admission medications   Medication Sig Start Date End Date Taking? Authorizing Provider  amLODipine (NORVASC) 10 MG tablet Take 10 mg by mouth daily.  03/01/11   [provider]  aspirin 81 MG tablet Take 81 mg by mouth daily.     [provider]  atenolol (TENORMIN) 50 MG tablet Take 50 mg by mouth 2 (two) times daily.  03/17/11   [provider]  calcitRIOL (ROCALTROL) 0.5 MCG capsule Take 1 capsule (0.5 mcg total) by mouth every Monday, Wednesday, and Friday with hemodialysis. 11/16/15   Iline Oven, MD  calcium acetate (PHOSLO) 667 MG capsule Take [phone] mg by mouth 3 (three) times daily with meals. Tales 5 caps with each meal, 2 caps with snacks    [provider]  cinacalcet (SENSIPAR) 90 MG tablet TAKE 1 TABLET BY MOUTH DAILY WITH FOOD (DO NOT TAKE LESS THAN 12 HOURS PRIOR TO DIALYSIS) 11/26/18   [provider]  ciprofloxacin (CIPRO) 500 MG tablet Take 1 tablet by mouth once. Before colonoscpoy 02/16/19   [provider]  diphenhydrAMINE (BENADRYL) 25 MG tablet Take 25 mg by mouth every 8 (eight) hours as needed (for cough).    [provider]  docusate sodium (COLACE) 100 MG capsule Take 100 mg by mouth daily.    [provider]  ferric citrate (AURYXIA) 1 GM 210 MG(Fe) tablet Take 420 mg by mouth 3 (three) times daily with meals.    [provider]  gentamicin cream (GARAMYCIN) 0.1 % Apply 1 application topically as needed. 03/30/18   [provider]  sucroferric oxyhydroxide (VELPHORO) 500 MG  chewable tablet Chew 1 tablet by mouth daily. 08/27/18   [provider]    Family History Family History  Problem Relation Age of Onset  . Hypertension Mother   . Hypertension Father   . Diabetes Brother   . Deep vein thrombosis Brother   . Hypertension Sister   . Hyperlipidemia Sister   . Kidney disease Brother        on HD  . Hypertension Sister   . Colon cancer Neg Hx   . Esophageal cancer Neg Hx   . Rectal cancer Neg Hx     Social History Social History   Tobacco Use  . Smoking status: Current Every Day Smoker    Packs/day: 0.50    Years: 27.00    Pack years: 13.50    Types: Cigarettes  . Smokeless tobacco: Never Used  . Tobacco comment: 10 cigarettes a day  Substance Use Topics  . Alcohol use: No    Alcohol/week: 0.0 standard drinks    Comment: occasional drinker noted in the past  . Drug use: No     Allergies   Penicillin g and Penicillins   Review of Systems Review of Systems  Constitutional: Negative for fever.  Respiratory: Positive for cough and shortness of breath.   Cardiovascular: Positive for chest pain.  Gastrointestinal: Negative for abdominal pain and diarrhea.  Neurological: Positive for weakness and light-headedness. Negative for syncope.  All other systems reviewed and are negative.    Physical Exam Updated Vital Signs BP 124/88   Pulse (!) 102   Temp 98.2 F (36.8 C) (Oral)   Resp 18   Ht 1.702 m (5\' 7" )   Wt 81.6 kg   LMP 06/02/2015   SpO2 97%   BMI 28.19 kg/m   Physical Exam CONSTITUTIONAL: Well developed/well nourished HEAD: Normocephalic/atraumatic EYES: EOMI/PERRL ENMT: Mucous membranes moist NECK: supple no meningeal signs SPINE/BACK:entire spine nontender CV: S1/S2 noted, no murmurs/rubs/gallops noted LUNGS: Lungs are clear to auscultation bilaterally, no apparent distress ABDOMEN: soft, nontender, no rebound or guarding, bowel sounds noted throughout abdomen PD catheter noted in abdomen, nontender  GU:no cva tenderness NEURO: Pt is awake/alert/appropriate, moves all extremitiesx4.  No facial droop.   EXTREMITIES: pulses normal/equal, full ROM SKIN: warm, color normal PSYCH: no abnormalities of mood noted, alert and oriented to situation   ED Treatments / Results  Labs (all labs ordered are listed, but only abnormal results are displayed) Labs Reviewed  BASIC METABOLIC PANEL - Abnormal; Notable for the following components:      Result Value   Sodium 133 (*)    Potassium 3.0 (*)    Chloride 91 (*)    BUN 49 (*)    Creatinine, Ser 20.44 (*)    GFR calc non Af Amer 2 (*)    GFR calc Af Amer 2 (*)    Anion gap 19 (*)    All other components within normal limits  CBC - Abnormal; Notable for the following components:   Hemoglobin 15.4 (*)    HCT 46.3 (*)    All other components within normal limits  I-STAT BETA HCG BLOOD, ED ([facility], [facility], [facility] ONLY) - Abnormal; Notable for the following components:   I-stat hCG, quantitative 18.1 (*)    All other components within normal limits  TROPONIN I (HIGH SENSITIVITY)    EKG  ED ECG REPORT   Date: 03/02/2019 0530am  Rate: 94  Rhythm: normal sinus rhythm  QRS Axis: left  Intervals: normal  ST/T Wave abnormalities: normal  Conduction Disutrbances:none  Narrative Interpretation:   Old EKG Reviewed: changes noted  I have personally reviewed the EKG tracing and agree with the computerized printout as noted.  Radiology No results found.  Procedures Procedures   Medications Ordered in ED Medications  sodium chloride flush (NS) 0.9 % injection 3 mL (has no administration in time range)  aspirin chewable tablet 324 mg (has no administration in time range)     Initial Impression / Assessment and Plan / ED Course  I have reviewed the triage vital signs and the nursing notes.  Pertinent labs & imaging results that were available during my care of the patient were reviewed by me and considered in my medical decision making (see chart  for details).     HEAR Score: 3 7:16 AM Patient presented with episodes of dizziness lightheadedness with exertion for the past several weeks.  Also  reports intermittent chest pain.  Denies known history of CAD/PE.  Heart score is 3.  No acute EKG changes. Some of her weakness/fatigue may be due to her dialysis. Plan at signout to Dr. Tamera Punt at 7:15 AM, repeat troponin at 8 AM.  Give patient fluid bolus.  If troponin remains negative, she can be discharged as she has nephrology follow-up today  Final Clinical Impressions(s) / ED Diagnoses   Final diagnoses:  None    ED Discharge Orders    None       Ripley Fraise, MD 03/02/19 [phone]

## 2019-03-03 DIAGNOSIS — N186 End stage renal disease: Secondary | ICD-10-CM | POA: Diagnosis not present

## 2019-03-03 DIAGNOSIS — D509 Iron deficiency anemia, unspecified: Secondary | ICD-10-CM | POA: Diagnosis not present

## 2019-03-03 DIAGNOSIS — N2589 Other disorders resulting from impaired renal tubular function: Secondary | ICD-10-CM | POA: Diagnosis not present

## 2019-03-03 DIAGNOSIS — N2581 Secondary hyperparathyroidism of renal origin: Secondary | ICD-10-CM | POA: Diagnosis not present

## 2019-03-03 DIAGNOSIS — D631 Anemia in chronic kidney disease: Secondary | ICD-10-CM | POA: Diagnosis not present

## 2019-03-03 DIAGNOSIS — Z4932 Encounter for adequacy testing for peritoneal dialysis: Secondary | ICD-10-CM | POA: Diagnosis not present

## 2019-03-04 DIAGNOSIS — N2581 Secondary hyperparathyroidism of renal origin: Secondary | ICD-10-CM | POA: Diagnosis not present

## 2019-03-04 DIAGNOSIS — N6323 Unspecified lump in the left breast, lower outer quadrant: Secondary | ICD-10-CM | POA: Diagnosis not present

## 2019-03-04 DIAGNOSIS — Z1231 Encounter for screening mammogram for malignant neoplasm of breast: Secondary | ICD-10-CM | POA: Diagnosis not present

## 2019-03-04 DIAGNOSIS — N2589 Other disorders resulting from impaired renal tubular function: Secondary | ICD-10-CM | POA: Diagnosis not present

## 2019-03-04 DIAGNOSIS — Z1239 Encounter for other screening for malignant neoplasm of breast: Secondary | ICD-10-CM | POA: Diagnosis not present

## 2019-03-04 DIAGNOSIS — Z4932 Encounter for adequacy testing for peritoneal dialysis: Secondary | ICD-10-CM | POA: Diagnosis not present

## 2019-03-04 DIAGNOSIS — D631 Anemia in chronic kidney disease: Secondary | ICD-10-CM | POA: Diagnosis not present

## 2019-03-04 DIAGNOSIS — N6322 Unspecified lump in the left breast, upper inner quadrant: Secondary | ICD-10-CM | POA: Diagnosis not present

## 2019-03-04 DIAGNOSIS — D509 Iron deficiency anemia, unspecified: Secondary | ICD-10-CM | POA: Diagnosis not present

## 2019-03-04 DIAGNOSIS — D242 Benign neoplasm of left breast: Secondary | ICD-10-CM | POA: Diagnosis not present

## 2019-03-04 DIAGNOSIS — N186 End stage renal disease: Secondary | ICD-10-CM | POA: Diagnosis not present

## 2019-03-05 DIAGNOSIS — Z992 Dependence on renal dialysis: Secondary | ICD-10-CM | POA: Diagnosis not present

## 2019-03-05 DIAGNOSIS — I129 Hypertensive chronic kidney disease with stage 1 through stage 4 chronic kidney disease, or unspecified chronic kidney disease: Secondary | ICD-10-CM | POA: Diagnosis not present

## 2019-03-05 DIAGNOSIS — N2581 Secondary hyperparathyroidism of renal origin: Secondary | ICD-10-CM | POA: Diagnosis not present

## 2019-03-05 DIAGNOSIS — N186 End stage renal disease: Secondary | ICD-10-CM | POA: Diagnosis not present

## 2019-03-05 DIAGNOSIS — D509 Iron deficiency anemia, unspecified: Secondary | ICD-10-CM | POA: Diagnosis not present

## 2019-03-05 DIAGNOSIS — D631 Anemia in chronic kidney disease: Secondary | ICD-10-CM | POA: Diagnosis not present

## 2019-03-05 DIAGNOSIS — E440 Moderate protein-calorie malnutrition: Secondary | ICD-10-CM | POA: Diagnosis not present

## 2019-03-05 DIAGNOSIS — Z79899 Other long term (current) drug therapy: Secondary | ICD-10-CM | POA: Diagnosis not present

## 2019-03-05 DIAGNOSIS — R17 Unspecified jaundice: Secondary | ICD-10-CM | POA: Diagnosis not present

## 2019-03-05 DIAGNOSIS — K769 Liver disease, unspecified: Secondary | ICD-10-CM | POA: Diagnosis not present

## 2019-03-06 DIAGNOSIS — Z992 Dependence on renal dialysis: Secondary | ICD-10-CM | POA: Diagnosis not present

## 2019-03-06 DIAGNOSIS — D631 Anemia in chronic kidney disease: Secondary | ICD-10-CM | POA: Diagnosis not present

## 2019-03-06 DIAGNOSIS — D509 Iron deficiency anemia, unspecified: Secondary | ICD-10-CM | POA: Diagnosis not present

## 2019-03-06 DIAGNOSIS — K769 Liver disease, unspecified: Secondary | ICD-10-CM | POA: Diagnosis not present

## 2019-03-06 DIAGNOSIS — N186 End stage renal disease: Secondary | ICD-10-CM | POA: Diagnosis not present

## 2019-03-06 DIAGNOSIS — N2581 Secondary hyperparathyroidism of renal origin: Secondary | ICD-10-CM | POA: Diagnosis not present

## 2019-03-07 DIAGNOSIS — N2581 Secondary hyperparathyroidism of renal origin: Secondary | ICD-10-CM | POA: Diagnosis not present

## 2019-03-07 DIAGNOSIS — K769 Liver disease, unspecified: Secondary | ICD-10-CM | POA: Diagnosis not present

## 2019-03-07 DIAGNOSIS — N186 End stage renal disease: Secondary | ICD-10-CM | POA: Diagnosis not present

## 2019-03-07 DIAGNOSIS — D631 Anemia in chronic kidney disease: Secondary | ICD-10-CM | POA: Diagnosis not present

## 2019-03-07 DIAGNOSIS — D509 Iron deficiency anemia, unspecified: Secondary | ICD-10-CM | POA: Diagnosis not present

## 2019-03-07 DIAGNOSIS — R82998 Other abnormal findings in urine: Secondary | ICD-10-CM | POA: Diagnosis not present

## 2019-03-07 DIAGNOSIS — Z992 Dependence on renal dialysis: Secondary | ICD-10-CM | POA: Diagnosis not present

## 2019-03-08 DIAGNOSIS — N2581 Secondary hyperparathyroidism of renal origin: Secondary | ICD-10-CM | POA: Diagnosis not present

## 2019-03-08 DIAGNOSIS — D509 Iron deficiency anemia, unspecified: Secondary | ICD-10-CM | POA: Diagnosis not present

## 2019-03-08 DIAGNOSIS — D631 Anemia in chronic kidney disease: Secondary | ICD-10-CM | POA: Diagnosis not present

## 2019-03-08 DIAGNOSIS — N186 End stage renal disease: Secondary | ICD-10-CM | POA: Diagnosis not present

## 2019-03-08 DIAGNOSIS — K769 Liver disease, unspecified: Secondary | ICD-10-CM | POA: Diagnosis not present

## 2019-03-08 DIAGNOSIS — Z992 Dependence on renal dialysis: Secondary | ICD-10-CM | POA: Diagnosis not present

## 2019-03-09 DIAGNOSIS — Z992 Dependence on renal dialysis: Secondary | ICD-10-CM | POA: Diagnosis not present

## 2019-03-09 DIAGNOSIS — K769 Liver disease, unspecified: Secondary | ICD-10-CM | POA: Diagnosis not present

## 2019-03-09 DIAGNOSIS — D509 Iron deficiency anemia, unspecified: Secondary | ICD-10-CM | POA: Diagnosis not present

## 2019-03-09 DIAGNOSIS — N2581 Secondary hyperparathyroidism of renal origin: Secondary | ICD-10-CM | POA: Diagnosis not present

## 2019-03-09 DIAGNOSIS — N186 End stage renal disease: Secondary | ICD-10-CM | POA: Diagnosis not present

## 2019-03-09 DIAGNOSIS — D631 Anemia in chronic kidney disease: Secondary | ICD-10-CM | POA: Diagnosis not present

## 2019-03-10 DIAGNOSIS — Z992 Dependence on renal dialysis: Secondary | ICD-10-CM | POA: Diagnosis not present

## 2019-03-10 DIAGNOSIS — K769 Liver disease, unspecified: Secondary | ICD-10-CM | POA: Diagnosis not present

## 2019-03-10 DIAGNOSIS — N186 End stage renal disease: Secondary | ICD-10-CM | POA: Diagnosis not present

## 2019-03-10 DIAGNOSIS — N2581 Secondary hyperparathyroidism of renal origin: Secondary | ICD-10-CM | POA: Diagnosis not present

## 2019-03-10 DIAGNOSIS — D509 Iron deficiency anemia, unspecified: Secondary | ICD-10-CM | POA: Diagnosis not present

## 2019-03-10 DIAGNOSIS — D631 Anemia in chronic kidney disease: Secondary | ICD-10-CM | POA: Diagnosis not present

## 2019-03-11 DIAGNOSIS — N2581 Secondary hyperparathyroidism of renal origin: Secondary | ICD-10-CM | POA: Diagnosis not present

## 2019-03-11 DIAGNOSIS — D509 Iron deficiency anemia, unspecified: Secondary | ICD-10-CM | POA: Diagnosis not present

## 2019-03-11 DIAGNOSIS — Z992 Dependence on renal dialysis: Secondary | ICD-10-CM | POA: Diagnosis not present

## 2019-03-11 DIAGNOSIS — D631 Anemia in chronic kidney disease: Secondary | ICD-10-CM | POA: Diagnosis not present

## 2019-03-11 DIAGNOSIS — N186 End stage renal disease: Secondary | ICD-10-CM | POA: Diagnosis not present

## 2019-03-11 DIAGNOSIS — K769 Liver disease, unspecified: Secondary | ICD-10-CM | POA: Diagnosis not present

## 2019-03-12 DIAGNOSIS — D509 Iron deficiency anemia, unspecified: Secondary | ICD-10-CM | POA: Diagnosis not present

## 2019-03-12 DIAGNOSIS — D631 Anemia in chronic kidney disease: Secondary | ICD-10-CM | POA: Diagnosis not present

## 2019-03-12 DIAGNOSIS — N2581 Secondary hyperparathyroidism of renal origin: Secondary | ICD-10-CM | POA: Diagnosis not present

## 2019-03-12 DIAGNOSIS — K769 Liver disease, unspecified: Secondary | ICD-10-CM | POA: Diagnosis not present

## 2019-03-12 DIAGNOSIS — Z992 Dependence on renal dialysis: Secondary | ICD-10-CM | POA: Diagnosis not present

## 2019-03-12 DIAGNOSIS — N186 End stage renal disease: Secondary | ICD-10-CM | POA: Diagnosis not present

## 2019-03-13 DIAGNOSIS — D509 Iron deficiency anemia, unspecified: Secondary | ICD-10-CM | POA: Diagnosis not present

## 2019-03-13 DIAGNOSIS — Z992 Dependence on renal dialysis: Secondary | ICD-10-CM | POA: Diagnosis not present

## 2019-03-13 DIAGNOSIS — D631 Anemia in chronic kidney disease: Secondary | ICD-10-CM | POA: Diagnosis not present

## 2019-03-13 DIAGNOSIS — N186 End stage renal disease: Secondary | ICD-10-CM | POA: Diagnosis not present

## 2019-03-13 DIAGNOSIS — K769 Liver disease, unspecified: Secondary | ICD-10-CM | POA: Diagnosis not present

## 2019-03-13 DIAGNOSIS — N2581 Secondary hyperparathyroidism of renal origin: Secondary | ICD-10-CM | POA: Diagnosis not present

## 2019-03-14 DIAGNOSIS — N186 End stage renal disease: Secondary | ICD-10-CM | POA: Diagnosis not present

## 2019-03-14 DIAGNOSIS — Z992 Dependence on renal dialysis: Secondary | ICD-10-CM | POA: Diagnosis not present

## 2019-03-14 DIAGNOSIS — K769 Liver disease, unspecified: Secondary | ICD-10-CM | POA: Diagnosis not present

## 2019-03-14 DIAGNOSIS — D631 Anemia in chronic kidney disease: Secondary | ICD-10-CM | POA: Diagnosis not present

## 2019-03-14 DIAGNOSIS — N2581 Secondary hyperparathyroidism of renal origin: Secondary | ICD-10-CM | POA: Diagnosis not present

## 2019-03-14 DIAGNOSIS — D509 Iron deficiency anemia, unspecified: Secondary | ICD-10-CM | POA: Diagnosis not present

## 2019-03-15 DIAGNOSIS — N186 End stage renal disease: Secondary | ICD-10-CM | POA: Diagnosis not present

## 2019-03-15 DIAGNOSIS — N2581 Secondary hyperparathyroidism of renal origin: Secondary | ICD-10-CM | POA: Diagnosis not present

## 2019-03-15 DIAGNOSIS — Z992 Dependence on renal dialysis: Secondary | ICD-10-CM | POA: Diagnosis not present

## 2019-03-15 DIAGNOSIS — D509 Iron deficiency anemia, unspecified: Secondary | ICD-10-CM | POA: Diagnosis not present

## 2019-03-15 DIAGNOSIS — K769 Liver disease, unspecified: Secondary | ICD-10-CM | POA: Diagnosis not present

## 2019-03-15 DIAGNOSIS — D631 Anemia in chronic kidney disease: Secondary | ICD-10-CM | POA: Diagnosis not present

## 2019-03-16 DIAGNOSIS — N186 End stage renal disease: Secondary | ICD-10-CM | POA: Diagnosis not present

## 2019-03-16 DIAGNOSIS — D631 Anemia in chronic kidney disease: Secondary | ICD-10-CM | POA: Diagnosis not present

## 2019-03-16 DIAGNOSIS — Z992 Dependence on renal dialysis: Secondary | ICD-10-CM | POA: Diagnosis not present

## 2019-03-16 DIAGNOSIS — D509 Iron deficiency anemia, unspecified: Secondary | ICD-10-CM | POA: Diagnosis not present

## 2019-03-16 DIAGNOSIS — K769 Liver disease, unspecified: Secondary | ICD-10-CM | POA: Diagnosis not present

## 2019-03-16 DIAGNOSIS — N2581 Secondary hyperparathyroidism of renal origin: Secondary | ICD-10-CM | POA: Diagnosis not present

## 2019-03-17 DIAGNOSIS — Z992 Dependence on renal dialysis: Secondary | ICD-10-CM | POA: Diagnosis not present

## 2019-03-17 DIAGNOSIS — N2581 Secondary hyperparathyroidism of renal origin: Secondary | ICD-10-CM | POA: Diagnosis not present

## 2019-03-17 DIAGNOSIS — D631 Anemia in chronic kidney disease: Secondary | ICD-10-CM | POA: Diagnosis not present

## 2019-03-17 DIAGNOSIS — D509 Iron deficiency anemia, unspecified: Secondary | ICD-10-CM | POA: Diagnosis not present

## 2019-03-17 DIAGNOSIS — K769 Liver disease, unspecified: Secondary | ICD-10-CM | POA: Diagnosis not present

## 2019-03-17 DIAGNOSIS — N186 End stage renal disease: Secondary | ICD-10-CM | POA: Diagnosis not present

## 2019-03-18 DIAGNOSIS — D631 Anemia in chronic kidney disease: Secondary | ICD-10-CM | POA: Diagnosis not present

## 2019-03-18 DIAGNOSIS — Z992 Dependence on renal dialysis: Secondary | ICD-10-CM | POA: Diagnosis not present

## 2019-03-18 DIAGNOSIS — N186 End stage renal disease: Secondary | ICD-10-CM | POA: Diagnosis not present

## 2019-03-18 DIAGNOSIS — K769 Liver disease, unspecified: Secondary | ICD-10-CM | POA: Diagnosis not present

## 2019-03-18 DIAGNOSIS — N2581 Secondary hyperparathyroidism of renal origin: Secondary | ICD-10-CM | POA: Diagnosis not present

## 2019-03-18 DIAGNOSIS — D509 Iron deficiency anemia, unspecified: Secondary | ICD-10-CM | POA: Diagnosis not present

## 2019-03-19 DIAGNOSIS — N186 End stage renal disease: Secondary | ICD-10-CM | POA: Diagnosis not present

## 2019-03-19 DIAGNOSIS — D631 Anemia in chronic kidney disease: Secondary | ICD-10-CM | POA: Diagnosis not present

## 2019-03-19 DIAGNOSIS — Z992 Dependence on renal dialysis: Secondary | ICD-10-CM | POA: Diagnosis not present

## 2019-03-19 DIAGNOSIS — D509 Iron deficiency anemia, unspecified: Secondary | ICD-10-CM | POA: Diagnosis not present

## 2019-03-19 DIAGNOSIS — K769 Liver disease, unspecified: Secondary | ICD-10-CM | POA: Diagnosis not present

## 2019-03-19 DIAGNOSIS — N2581 Secondary hyperparathyroidism of renal origin: Secondary | ICD-10-CM | POA: Diagnosis not present

## 2019-03-20 DIAGNOSIS — N2581 Secondary hyperparathyroidism of renal origin: Secondary | ICD-10-CM | POA: Diagnosis not present

## 2019-03-20 DIAGNOSIS — N186 End stage renal disease: Secondary | ICD-10-CM | POA: Diagnosis not present

## 2019-03-20 DIAGNOSIS — K769 Liver disease, unspecified: Secondary | ICD-10-CM | POA: Diagnosis not present

## 2019-03-20 DIAGNOSIS — D631 Anemia in chronic kidney disease: Secondary | ICD-10-CM | POA: Diagnosis not present

## 2019-03-20 DIAGNOSIS — D509 Iron deficiency anemia, unspecified: Secondary | ICD-10-CM | POA: Diagnosis not present

## 2019-03-20 DIAGNOSIS — Z992 Dependence on renal dialysis: Secondary | ICD-10-CM | POA: Diagnosis not present

## 2019-03-21 DIAGNOSIS — N186 End stage renal disease: Secondary | ICD-10-CM | POA: Diagnosis not present

## 2019-03-21 DIAGNOSIS — D509 Iron deficiency anemia, unspecified: Secondary | ICD-10-CM | POA: Diagnosis not present

## 2019-03-21 DIAGNOSIS — Z992 Dependence on renal dialysis: Secondary | ICD-10-CM | POA: Diagnosis not present

## 2019-03-21 DIAGNOSIS — N2581 Secondary hyperparathyroidism of renal origin: Secondary | ICD-10-CM | POA: Diagnosis not present

## 2019-03-21 DIAGNOSIS — K769 Liver disease, unspecified: Secondary | ICD-10-CM | POA: Diagnosis not present

## 2019-03-21 DIAGNOSIS — D631 Anemia in chronic kidney disease: Secondary | ICD-10-CM | POA: Diagnosis not present

## 2019-03-22 DIAGNOSIS — N2581 Secondary hyperparathyroidism of renal origin: Secondary | ICD-10-CM | POA: Diagnosis not present

## 2019-03-22 DIAGNOSIS — N186 End stage renal disease: Secondary | ICD-10-CM | POA: Diagnosis not present

## 2019-03-22 DIAGNOSIS — D509 Iron deficiency anemia, unspecified: Secondary | ICD-10-CM | POA: Diagnosis not present

## 2019-03-22 DIAGNOSIS — K769 Liver disease, unspecified: Secondary | ICD-10-CM | POA: Diagnosis not present

## 2019-03-22 DIAGNOSIS — D631 Anemia in chronic kidney disease: Secondary | ICD-10-CM | POA: Diagnosis not present

## 2019-03-22 DIAGNOSIS — Z992 Dependence on renal dialysis: Secondary | ICD-10-CM | POA: Diagnosis not present

## 2019-03-23 DIAGNOSIS — D509 Iron deficiency anemia, unspecified: Secondary | ICD-10-CM | POA: Diagnosis not present

## 2019-03-23 DIAGNOSIS — D631 Anemia in chronic kidney disease: Secondary | ICD-10-CM | POA: Diagnosis not present

## 2019-03-23 DIAGNOSIS — K769 Liver disease, unspecified: Secondary | ICD-10-CM | POA: Diagnosis not present

## 2019-03-23 DIAGNOSIS — N2581 Secondary hyperparathyroidism of renal origin: Secondary | ICD-10-CM | POA: Diagnosis not present

## 2019-03-23 DIAGNOSIS — N186 End stage renal disease: Secondary | ICD-10-CM | POA: Diagnosis not present

## 2019-03-23 DIAGNOSIS — Z992 Dependence on renal dialysis: Secondary | ICD-10-CM | POA: Diagnosis not present

## 2019-03-24 DIAGNOSIS — N2581 Secondary hyperparathyroidism of renal origin: Secondary | ICD-10-CM | POA: Diagnosis not present

## 2019-03-24 DIAGNOSIS — D631 Anemia in chronic kidney disease: Secondary | ICD-10-CM | POA: Diagnosis not present

## 2019-03-24 DIAGNOSIS — D509 Iron deficiency anemia, unspecified: Secondary | ICD-10-CM | POA: Diagnosis not present

## 2019-03-24 DIAGNOSIS — K769 Liver disease, unspecified: Secondary | ICD-10-CM | POA: Diagnosis not present

## 2019-03-24 DIAGNOSIS — N186 End stage renal disease: Secondary | ICD-10-CM | POA: Diagnosis not present

## 2019-03-24 DIAGNOSIS — Z992 Dependence on renal dialysis: Secondary | ICD-10-CM | POA: Diagnosis not present

## 2019-03-25 DIAGNOSIS — D509 Iron deficiency anemia, unspecified: Secondary | ICD-10-CM | POA: Diagnosis not present

## 2019-03-25 DIAGNOSIS — N186 End stage renal disease: Secondary | ICD-10-CM | POA: Diagnosis not present

## 2019-03-25 DIAGNOSIS — D631 Anemia in chronic kidney disease: Secondary | ICD-10-CM | POA: Diagnosis not present

## 2019-03-25 DIAGNOSIS — K769 Liver disease, unspecified: Secondary | ICD-10-CM | POA: Diagnosis not present

## 2019-03-25 DIAGNOSIS — Z992 Dependence on renal dialysis: Secondary | ICD-10-CM | POA: Diagnosis not present

## 2019-03-25 DIAGNOSIS — N2581 Secondary hyperparathyroidism of renal origin: Secondary | ICD-10-CM | POA: Diagnosis not present

## 2019-03-26 DIAGNOSIS — N186 End stage renal disease: Secondary | ICD-10-CM | POA: Diagnosis not present

## 2019-03-26 DIAGNOSIS — N2581 Secondary hyperparathyroidism of renal origin: Secondary | ICD-10-CM | POA: Diagnosis not present

## 2019-03-26 DIAGNOSIS — Z992 Dependence on renal dialysis: Secondary | ICD-10-CM | POA: Diagnosis not present

## 2019-03-26 DIAGNOSIS — K769 Liver disease, unspecified: Secondary | ICD-10-CM | POA: Diagnosis not present

## 2019-03-26 DIAGNOSIS — D631 Anemia in chronic kidney disease: Secondary | ICD-10-CM | POA: Diagnosis not present

## 2019-03-26 DIAGNOSIS — D509 Iron deficiency anemia, unspecified: Secondary | ICD-10-CM | POA: Diagnosis not present

## 2019-03-27 DIAGNOSIS — N186 End stage renal disease: Secondary | ICD-10-CM | POA: Diagnosis not present

## 2019-03-27 DIAGNOSIS — D631 Anemia in chronic kidney disease: Secondary | ICD-10-CM | POA: Diagnosis not present

## 2019-03-27 DIAGNOSIS — D509 Iron deficiency anemia, unspecified: Secondary | ICD-10-CM | POA: Diagnosis not present

## 2019-03-27 DIAGNOSIS — N2581 Secondary hyperparathyroidism of renal origin: Secondary | ICD-10-CM | POA: Diagnosis not present

## 2019-03-27 DIAGNOSIS — Z992 Dependence on renal dialysis: Secondary | ICD-10-CM | POA: Diagnosis not present

## 2019-03-27 DIAGNOSIS — K769 Liver disease, unspecified: Secondary | ICD-10-CM | POA: Diagnosis not present

## 2019-03-28 DIAGNOSIS — N186 End stage renal disease: Secondary | ICD-10-CM | POA: Diagnosis not present

## 2019-03-28 DIAGNOSIS — N2581 Secondary hyperparathyroidism of renal origin: Secondary | ICD-10-CM | POA: Diagnosis not present

## 2019-03-28 DIAGNOSIS — D509 Iron deficiency anemia, unspecified: Secondary | ICD-10-CM | POA: Diagnosis not present

## 2019-03-28 DIAGNOSIS — Z992 Dependence on renal dialysis: Secondary | ICD-10-CM | POA: Diagnosis not present

## 2019-03-28 DIAGNOSIS — D631 Anemia in chronic kidney disease: Secondary | ICD-10-CM | POA: Diagnosis not present

## 2019-03-28 DIAGNOSIS — K769 Liver disease, unspecified: Secondary | ICD-10-CM | POA: Diagnosis not present

## 2019-03-29 DIAGNOSIS — N2581 Secondary hyperparathyroidism of renal origin: Secondary | ICD-10-CM | POA: Diagnosis not present

## 2019-03-29 DIAGNOSIS — K769 Liver disease, unspecified: Secondary | ICD-10-CM | POA: Diagnosis not present

## 2019-03-29 DIAGNOSIS — Z992 Dependence on renal dialysis: Secondary | ICD-10-CM | POA: Diagnosis not present

## 2019-03-29 DIAGNOSIS — N186 End stage renal disease: Secondary | ICD-10-CM | POA: Diagnosis not present

## 2019-03-29 DIAGNOSIS — D509 Iron deficiency anemia, unspecified: Secondary | ICD-10-CM | POA: Diagnosis not present

## 2019-03-29 DIAGNOSIS — D631 Anemia in chronic kidney disease: Secondary | ICD-10-CM | POA: Diagnosis not present

## 2019-03-30 DIAGNOSIS — Z992 Dependence on renal dialysis: Secondary | ICD-10-CM | POA: Diagnosis not present

## 2019-03-30 DIAGNOSIS — D509 Iron deficiency anemia, unspecified: Secondary | ICD-10-CM | POA: Diagnosis not present

## 2019-03-30 DIAGNOSIS — K769 Liver disease, unspecified: Secondary | ICD-10-CM | POA: Diagnosis not present

## 2019-03-30 DIAGNOSIS — D631 Anemia in chronic kidney disease: Secondary | ICD-10-CM | POA: Diagnosis not present

## 2019-03-30 DIAGNOSIS — N2581 Secondary hyperparathyroidism of renal origin: Secondary | ICD-10-CM | POA: Diagnosis not present

## 2019-03-30 DIAGNOSIS — N186 End stage renal disease: Secondary | ICD-10-CM | POA: Diagnosis not present

## 2019-03-31 DIAGNOSIS — K769 Liver disease, unspecified: Secondary | ICD-10-CM | POA: Diagnosis not present

## 2019-03-31 DIAGNOSIS — Z992 Dependence on renal dialysis: Secondary | ICD-10-CM | POA: Diagnosis not present

## 2019-03-31 DIAGNOSIS — D631 Anemia in chronic kidney disease: Secondary | ICD-10-CM | POA: Diagnosis not present

## 2019-03-31 DIAGNOSIS — D509 Iron deficiency anemia, unspecified: Secondary | ICD-10-CM | POA: Diagnosis not present

## 2019-03-31 DIAGNOSIS — N2581 Secondary hyperparathyroidism of renal origin: Secondary | ICD-10-CM | POA: Diagnosis not present

## 2019-03-31 DIAGNOSIS — N186 End stage renal disease: Secondary | ICD-10-CM | POA: Diagnosis not present

## 2019-04-01 DIAGNOSIS — K769 Liver disease, unspecified: Secondary | ICD-10-CM | POA: Diagnosis not present

## 2019-04-01 DIAGNOSIS — N2581 Secondary hyperparathyroidism of renal origin: Secondary | ICD-10-CM | POA: Diagnosis not present

## 2019-04-01 DIAGNOSIS — Z992 Dependence on renal dialysis: Secondary | ICD-10-CM | POA: Diagnosis not present

## 2019-04-01 DIAGNOSIS — D509 Iron deficiency anemia, unspecified: Secondary | ICD-10-CM | POA: Diagnosis not present

## 2019-04-01 DIAGNOSIS — N186 End stage renal disease: Secondary | ICD-10-CM | POA: Diagnosis not present

## 2019-04-01 DIAGNOSIS — D631 Anemia in chronic kidney disease: Secondary | ICD-10-CM | POA: Diagnosis not present

## 2019-04-02 DIAGNOSIS — N186 End stage renal disease: Secondary | ICD-10-CM | POA: Diagnosis not present

## 2019-04-02 DIAGNOSIS — Z992 Dependence on renal dialysis: Secondary | ICD-10-CM | POA: Diagnosis not present

## 2019-04-02 DIAGNOSIS — N2581 Secondary hyperparathyroidism of renal origin: Secondary | ICD-10-CM | POA: Diagnosis not present

## 2019-04-02 DIAGNOSIS — K769 Liver disease, unspecified: Secondary | ICD-10-CM | POA: Diagnosis not present

## 2019-04-02 DIAGNOSIS — D509 Iron deficiency anemia, unspecified: Secondary | ICD-10-CM | POA: Diagnosis not present

## 2019-04-02 DIAGNOSIS — D631 Anemia in chronic kidney disease: Secondary | ICD-10-CM | POA: Diagnosis not present

## 2019-04-03 DIAGNOSIS — D509 Iron deficiency anemia, unspecified: Secondary | ICD-10-CM | POA: Diagnosis not present

## 2019-04-03 DIAGNOSIS — N186 End stage renal disease: Secondary | ICD-10-CM | POA: Diagnosis not present

## 2019-04-03 DIAGNOSIS — N2581 Secondary hyperparathyroidism of renal origin: Secondary | ICD-10-CM | POA: Diagnosis not present

## 2019-04-03 DIAGNOSIS — D631 Anemia in chronic kidney disease: Secondary | ICD-10-CM | POA: Diagnosis not present

## 2019-04-03 DIAGNOSIS — Z992 Dependence on renal dialysis: Secondary | ICD-10-CM | POA: Diagnosis not present

## 2019-04-03 DIAGNOSIS — K769 Liver disease, unspecified: Secondary | ICD-10-CM | POA: Diagnosis not present

## 2019-04-04 DIAGNOSIS — N186 End stage renal disease: Secondary | ICD-10-CM | POA: Diagnosis not present

## 2019-04-04 DIAGNOSIS — Z992 Dependence on renal dialysis: Secondary | ICD-10-CM | POA: Diagnosis not present

## 2019-04-04 DIAGNOSIS — N2581 Secondary hyperparathyroidism of renal origin: Secondary | ICD-10-CM | POA: Diagnosis not present

## 2019-04-04 DIAGNOSIS — K769 Liver disease, unspecified: Secondary | ICD-10-CM | POA: Diagnosis not present

## 2019-04-04 DIAGNOSIS — D631 Anemia in chronic kidney disease: Secondary | ICD-10-CM | POA: Diagnosis not present

## 2019-04-04 DIAGNOSIS — D509 Iron deficiency anemia, unspecified: Secondary | ICD-10-CM | POA: Diagnosis not present

## 2019-04-05 DIAGNOSIS — Z992 Dependence on renal dialysis: Secondary | ICD-10-CM | POA: Diagnosis not present

## 2019-04-05 DIAGNOSIS — E440 Moderate protein-calorie malnutrition: Secondary | ICD-10-CM | POA: Diagnosis not present

## 2019-04-05 DIAGNOSIS — N186 End stage renal disease: Secondary | ICD-10-CM | POA: Diagnosis not present

## 2019-04-05 DIAGNOSIS — R17 Unspecified jaundice: Secondary | ICD-10-CM | POA: Diagnosis not present

## 2019-04-05 DIAGNOSIS — D509 Iron deficiency anemia, unspecified: Secondary | ICD-10-CM | POA: Diagnosis not present

## 2019-04-05 DIAGNOSIS — D631 Anemia in chronic kidney disease: Secondary | ICD-10-CM | POA: Diagnosis not present

## 2019-04-05 DIAGNOSIS — R82998 Other abnormal findings in urine: Secondary | ICD-10-CM | POA: Diagnosis not present

## 2019-04-05 DIAGNOSIS — Z79899 Other long term (current) drug therapy: Secondary | ICD-10-CM | POA: Diagnosis not present

## 2019-04-05 DIAGNOSIS — E876 Hypokalemia: Secondary | ICD-10-CM | POA: Diagnosis not present

## 2019-04-05 DIAGNOSIS — I129 Hypertensive chronic kidney disease with stage 1 through stage 4 chronic kidney disease, or unspecified chronic kidney disease: Secondary | ICD-10-CM | POA: Diagnosis not present

## 2019-04-05 DIAGNOSIS — K769 Liver disease, unspecified: Secondary | ICD-10-CM | POA: Diagnosis not present

## 2019-04-05 DIAGNOSIS — N2581 Secondary hyperparathyroidism of renal origin: Secondary | ICD-10-CM | POA: Diagnosis not present

## 2019-04-06 DIAGNOSIS — N2581 Secondary hyperparathyroidism of renal origin: Secondary | ICD-10-CM | POA: Diagnosis not present

## 2019-04-06 DIAGNOSIS — Z79899 Other long term (current) drug therapy: Secondary | ICD-10-CM | POA: Diagnosis not present

## 2019-04-06 DIAGNOSIS — N186 End stage renal disease: Secondary | ICD-10-CM | POA: Diagnosis not present

## 2019-04-06 DIAGNOSIS — D631 Anemia in chronic kidney disease: Secondary | ICD-10-CM | POA: Diagnosis not present

## 2019-04-06 DIAGNOSIS — Z992 Dependence on renal dialysis: Secondary | ICD-10-CM | POA: Diagnosis not present

## 2019-04-06 DIAGNOSIS — E876 Hypokalemia: Secondary | ICD-10-CM | POA: Diagnosis not present

## 2019-04-07 DIAGNOSIS — E876 Hypokalemia: Secondary | ICD-10-CM | POA: Diagnosis not present

## 2019-04-07 DIAGNOSIS — D631 Anemia in chronic kidney disease: Secondary | ICD-10-CM | POA: Diagnosis not present

## 2019-04-07 DIAGNOSIS — Z79899 Other long term (current) drug therapy: Secondary | ICD-10-CM | POA: Diagnosis not present

## 2019-04-07 DIAGNOSIS — N186 End stage renal disease: Secondary | ICD-10-CM | POA: Diagnosis not present

## 2019-04-07 DIAGNOSIS — Z992 Dependence on renal dialysis: Secondary | ICD-10-CM | POA: Diagnosis not present

## 2019-04-07 DIAGNOSIS — N2581 Secondary hyperparathyroidism of renal origin: Secondary | ICD-10-CM | POA: Diagnosis not present

## 2019-04-08 DIAGNOSIS — N186 End stage renal disease: Secondary | ICD-10-CM | POA: Diagnosis not present

## 2019-04-08 DIAGNOSIS — Z992 Dependence on renal dialysis: Secondary | ICD-10-CM | POA: Diagnosis not present

## 2019-04-08 DIAGNOSIS — Z79899 Other long term (current) drug therapy: Secondary | ICD-10-CM | POA: Diagnosis not present

## 2019-04-08 DIAGNOSIS — D631 Anemia in chronic kidney disease: Secondary | ICD-10-CM | POA: Diagnosis not present

## 2019-04-08 DIAGNOSIS — N2581 Secondary hyperparathyroidism of renal origin: Secondary | ICD-10-CM | POA: Diagnosis not present

## 2019-04-08 DIAGNOSIS — E876 Hypokalemia: Secondary | ICD-10-CM | POA: Diagnosis not present

## 2019-04-09 DIAGNOSIS — E876 Hypokalemia: Secondary | ICD-10-CM | POA: Diagnosis not present

## 2019-04-09 DIAGNOSIS — N2581 Secondary hyperparathyroidism of renal origin: Secondary | ICD-10-CM | POA: Diagnosis not present

## 2019-04-09 DIAGNOSIS — N186 End stage renal disease: Secondary | ICD-10-CM | POA: Diagnosis not present

## 2019-04-09 DIAGNOSIS — D631 Anemia in chronic kidney disease: Secondary | ICD-10-CM | POA: Diagnosis not present

## 2019-04-09 DIAGNOSIS — Z79899 Other long term (current) drug therapy: Secondary | ICD-10-CM | POA: Diagnosis not present

## 2019-04-09 DIAGNOSIS — Z992 Dependence on renal dialysis: Secondary | ICD-10-CM | POA: Diagnosis not present

## 2019-04-10 DIAGNOSIS — Z79899 Other long term (current) drug therapy: Secondary | ICD-10-CM | POA: Diagnosis not present

## 2019-04-10 DIAGNOSIS — N2581 Secondary hyperparathyroidism of renal origin: Secondary | ICD-10-CM | POA: Diagnosis not present

## 2019-04-10 DIAGNOSIS — D631 Anemia in chronic kidney disease: Secondary | ICD-10-CM | POA: Diagnosis not present

## 2019-04-10 DIAGNOSIS — E876 Hypokalemia: Secondary | ICD-10-CM | POA: Diagnosis not present

## 2019-04-10 DIAGNOSIS — Z992 Dependence on renal dialysis: Secondary | ICD-10-CM | POA: Diagnosis not present

## 2019-04-10 DIAGNOSIS — N186 End stage renal disease: Secondary | ICD-10-CM | POA: Diagnosis not present

## 2019-04-11 DIAGNOSIS — D631 Anemia in chronic kidney disease: Secondary | ICD-10-CM | POA: Diagnosis not present

## 2019-04-11 DIAGNOSIS — E876 Hypokalemia: Secondary | ICD-10-CM | POA: Diagnosis not present

## 2019-04-11 DIAGNOSIS — Z79899 Other long term (current) drug therapy: Secondary | ICD-10-CM | POA: Diagnosis not present

## 2019-04-11 DIAGNOSIS — N2581 Secondary hyperparathyroidism of renal origin: Secondary | ICD-10-CM | POA: Diagnosis not present

## 2019-04-11 DIAGNOSIS — Z992 Dependence on renal dialysis: Secondary | ICD-10-CM | POA: Diagnosis not present

## 2019-04-11 DIAGNOSIS — N186 End stage renal disease: Secondary | ICD-10-CM | POA: Diagnosis not present

## 2019-04-12 DIAGNOSIS — D631 Anemia in chronic kidney disease: Secondary | ICD-10-CM | POA: Diagnosis not present

## 2019-04-12 DIAGNOSIS — Z992 Dependence on renal dialysis: Secondary | ICD-10-CM | POA: Diagnosis not present

## 2019-04-12 DIAGNOSIS — E876 Hypokalemia: Secondary | ICD-10-CM | POA: Diagnosis not present

## 2019-04-12 DIAGNOSIS — N2581 Secondary hyperparathyroidism of renal origin: Secondary | ICD-10-CM | POA: Diagnosis not present

## 2019-04-12 DIAGNOSIS — Z79899 Other long term (current) drug therapy: Secondary | ICD-10-CM | POA: Diagnosis not present

## 2019-04-12 DIAGNOSIS — N186 End stage renal disease: Secondary | ICD-10-CM | POA: Diagnosis not present

## 2019-04-13 DIAGNOSIS — Z79899 Other long term (current) drug therapy: Secondary | ICD-10-CM | POA: Diagnosis not present

## 2019-04-13 DIAGNOSIS — D631 Anemia in chronic kidney disease: Secondary | ICD-10-CM | POA: Diagnosis not present

## 2019-04-13 DIAGNOSIS — N2581 Secondary hyperparathyroidism of renal origin: Secondary | ICD-10-CM | POA: Diagnosis not present

## 2019-04-13 DIAGNOSIS — E876 Hypokalemia: Secondary | ICD-10-CM | POA: Diagnosis not present

## 2019-04-13 DIAGNOSIS — N186 End stage renal disease: Secondary | ICD-10-CM | POA: Diagnosis not present

## 2019-04-13 DIAGNOSIS — Z992 Dependence on renal dialysis: Secondary | ICD-10-CM | POA: Diagnosis not present

## 2019-04-14 DIAGNOSIS — Z79899 Other long term (current) drug therapy: Secondary | ICD-10-CM | POA: Diagnosis not present

## 2019-04-14 DIAGNOSIS — N186 End stage renal disease: Secondary | ICD-10-CM | POA: Diagnosis not present

## 2019-04-14 DIAGNOSIS — N2581 Secondary hyperparathyroidism of renal origin: Secondary | ICD-10-CM | POA: Diagnosis not present

## 2019-04-14 DIAGNOSIS — Z992 Dependence on renal dialysis: Secondary | ICD-10-CM | POA: Diagnosis not present

## 2019-04-14 DIAGNOSIS — E876 Hypokalemia: Secondary | ICD-10-CM | POA: Diagnosis not present

## 2019-04-14 DIAGNOSIS — D631 Anemia in chronic kidney disease: Secondary | ICD-10-CM | POA: Diagnosis not present

## 2019-04-15 DIAGNOSIS — N186 End stage renal disease: Secondary | ICD-10-CM | POA: Diagnosis not present

## 2019-04-15 DIAGNOSIS — Z992 Dependence on renal dialysis: Secondary | ICD-10-CM | POA: Diagnosis not present

## 2019-04-15 DIAGNOSIS — D631 Anemia in chronic kidney disease: Secondary | ICD-10-CM | POA: Diagnosis not present

## 2019-04-15 DIAGNOSIS — N2581 Secondary hyperparathyroidism of renal origin: Secondary | ICD-10-CM | POA: Diagnosis not present

## 2019-04-15 DIAGNOSIS — E876 Hypokalemia: Secondary | ICD-10-CM | POA: Diagnosis not present

## 2019-04-15 DIAGNOSIS — Z79899 Other long term (current) drug therapy: Secondary | ICD-10-CM | POA: Diagnosis not present

## 2019-04-16 DIAGNOSIS — E876 Hypokalemia: Secondary | ICD-10-CM | POA: Diagnosis not present

## 2019-04-16 DIAGNOSIS — Z79899 Other long term (current) drug therapy: Secondary | ICD-10-CM | POA: Diagnosis not present

## 2019-04-16 DIAGNOSIS — Z992 Dependence on renal dialysis: Secondary | ICD-10-CM | POA: Diagnosis not present

## 2019-04-16 DIAGNOSIS — N2581 Secondary hyperparathyroidism of renal origin: Secondary | ICD-10-CM | POA: Diagnosis not present

## 2019-04-16 DIAGNOSIS — N186 End stage renal disease: Secondary | ICD-10-CM | POA: Diagnosis not present

## 2019-04-16 DIAGNOSIS — D631 Anemia in chronic kidney disease: Secondary | ICD-10-CM | POA: Diagnosis not present

## 2019-04-17 DIAGNOSIS — Z79899 Other long term (current) drug therapy: Secondary | ICD-10-CM | POA: Diagnosis not present

## 2019-04-17 DIAGNOSIS — E876 Hypokalemia: Secondary | ICD-10-CM | POA: Diagnosis not present

## 2019-04-17 DIAGNOSIS — N186 End stage renal disease: Secondary | ICD-10-CM | POA: Diagnosis not present

## 2019-04-17 DIAGNOSIS — N2581 Secondary hyperparathyroidism of renal origin: Secondary | ICD-10-CM | POA: Diagnosis not present

## 2019-04-17 DIAGNOSIS — D631 Anemia in chronic kidney disease: Secondary | ICD-10-CM | POA: Diagnosis not present

## 2019-04-17 DIAGNOSIS — Z992 Dependence on renal dialysis: Secondary | ICD-10-CM | POA: Diagnosis not present

## 2019-04-18 DIAGNOSIS — D631 Anemia in chronic kidney disease: Secondary | ICD-10-CM | POA: Diagnosis not present

## 2019-04-18 DIAGNOSIS — E876 Hypokalemia: Secondary | ICD-10-CM | POA: Diagnosis not present

## 2019-04-18 DIAGNOSIS — N2581 Secondary hyperparathyroidism of renal origin: Secondary | ICD-10-CM | POA: Diagnosis not present

## 2019-04-18 DIAGNOSIS — Z992 Dependence on renal dialysis: Secondary | ICD-10-CM | POA: Diagnosis not present

## 2019-04-18 DIAGNOSIS — N186 End stage renal disease: Secondary | ICD-10-CM | POA: Diagnosis not present

## 2019-04-18 DIAGNOSIS — Z79899 Other long term (current) drug therapy: Secondary | ICD-10-CM | POA: Diagnosis not present

## 2019-04-19 DIAGNOSIS — D631 Anemia in chronic kidney disease: Secondary | ICD-10-CM | POA: Diagnosis not present

## 2019-04-19 DIAGNOSIS — E876 Hypokalemia: Secondary | ICD-10-CM | POA: Diagnosis not present

## 2019-04-19 DIAGNOSIS — Z79899 Other long term (current) drug therapy: Secondary | ICD-10-CM | POA: Diagnosis not present

## 2019-04-19 DIAGNOSIS — Z992 Dependence on renal dialysis: Secondary | ICD-10-CM | POA: Diagnosis not present

## 2019-04-19 DIAGNOSIS — N2581 Secondary hyperparathyroidism of renal origin: Secondary | ICD-10-CM | POA: Diagnosis not present

## 2019-04-19 DIAGNOSIS — N186 End stage renal disease: Secondary | ICD-10-CM | POA: Diagnosis not present

## 2019-04-20 DIAGNOSIS — D631 Anemia in chronic kidney disease: Secondary | ICD-10-CM | POA: Diagnosis not present

## 2019-04-20 DIAGNOSIS — Z79899 Other long term (current) drug therapy: Secondary | ICD-10-CM | POA: Diagnosis not present

## 2019-04-20 DIAGNOSIS — N2581 Secondary hyperparathyroidism of renal origin: Secondary | ICD-10-CM | POA: Diagnosis not present

## 2019-04-20 DIAGNOSIS — Z992 Dependence on renal dialysis: Secondary | ICD-10-CM | POA: Diagnosis not present

## 2019-04-20 DIAGNOSIS — E876 Hypokalemia: Secondary | ICD-10-CM | POA: Diagnosis not present

## 2019-04-20 DIAGNOSIS — N186 End stage renal disease: Secondary | ICD-10-CM | POA: Diagnosis not present

## 2019-04-21 DIAGNOSIS — Z79899 Other long term (current) drug therapy: Secondary | ICD-10-CM | POA: Diagnosis not present

## 2019-04-21 DIAGNOSIS — N186 End stage renal disease: Secondary | ICD-10-CM | POA: Diagnosis not present

## 2019-04-21 DIAGNOSIS — D631 Anemia in chronic kidney disease: Secondary | ICD-10-CM | POA: Diagnosis not present

## 2019-04-21 DIAGNOSIS — E876 Hypokalemia: Secondary | ICD-10-CM | POA: Diagnosis not present

## 2019-04-21 DIAGNOSIS — Z992 Dependence on renal dialysis: Secondary | ICD-10-CM | POA: Diagnosis not present

## 2019-04-21 DIAGNOSIS — N2581 Secondary hyperparathyroidism of renal origin: Secondary | ICD-10-CM | POA: Diagnosis not present

## 2019-04-22 DIAGNOSIS — N186 End stage renal disease: Secondary | ICD-10-CM | POA: Diagnosis not present

## 2019-04-22 DIAGNOSIS — N2581 Secondary hyperparathyroidism of renal origin: Secondary | ICD-10-CM | POA: Diagnosis not present

## 2019-04-22 DIAGNOSIS — E876 Hypokalemia: Secondary | ICD-10-CM | POA: Diagnosis not present

## 2019-04-22 DIAGNOSIS — D631 Anemia in chronic kidney disease: Secondary | ICD-10-CM | POA: Diagnosis not present

## 2019-04-22 DIAGNOSIS — Z79899 Other long term (current) drug therapy: Secondary | ICD-10-CM | POA: Diagnosis not present

## 2019-04-22 DIAGNOSIS — Z992 Dependence on renal dialysis: Secondary | ICD-10-CM | POA: Diagnosis not present

## 2019-04-23 DIAGNOSIS — Z992 Dependence on renal dialysis: Secondary | ICD-10-CM | POA: Diagnosis not present

## 2019-04-23 DIAGNOSIS — Z79899 Other long term (current) drug therapy: Secondary | ICD-10-CM | POA: Diagnosis not present

## 2019-04-23 DIAGNOSIS — N2581 Secondary hyperparathyroidism of renal origin: Secondary | ICD-10-CM | POA: Diagnosis not present

## 2019-04-23 DIAGNOSIS — E876 Hypokalemia: Secondary | ICD-10-CM | POA: Diagnosis not present

## 2019-04-23 DIAGNOSIS — N186 End stage renal disease: Secondary | ICD-10-CM | POA: Diagnosis not present

## 2019-04-23 DIAGNOSIS — D631 Anemia in chronic kidney disease: Secondary | ICD-10-CM | POA: Diagnosis not present

## 2019-04-24 DIAGNOSIS — N2581 Secondary hyperparathyroidism of renal origin: Secondary | ICD-10-CM | POA: Diagnosis not present

## 2019-04-24 DIAGNOSIS — N186 End stage renal disease: Secondary | ICD-10-CM | POA: Diagnosis not present

## 2019-04-24 DIAGNOSIS — Z79899 Other long term (current) drug therapy: Secondary | ICD-10-CM | POA: Diagnosis not present

## 2019-04-24 DIAGNOSIS — D631 Anemia in chronic kidney disease: Secondary | ICD-10-CM | POA: Diagnosis not present

## 2019-04-24 DIAGNOSIS — Z992 Dependence on renal dialysis: Secondary | ICD-10-CM | POA: Diagnosis not present

## 2019-04-24 DIAGNOSIS — E876 Hypokalemia: Secondary | ICD-10-CM | POA: Diagnosis not present

## 2019-04-25 DIAGNOSIS — D631 Anemia in chronic kidney disease: Secondary | ICD-10-CM | POA: Diagnosis not present

## 2019-04-25 DIAGNOSIS — E876 Hypokalemia: Secondary | ICD-10-CM | POA: Diagnosis not present

## 2019-04-25 DIAGNOSIS — Z992 Dependence on renal dialysis: Secondary | ICD-10-CM | POA: Diagnosis not present

## 2019-04-25 DIAGNOSIS — N186 End stage renal disease: Secondary | ICD-10-CM | POA: Diagnosis not present

## 2019-04-25 DIAGNOSIS — Z79899 Other long term (current) drug therapy: Secondary | ICD-10-CM | POA: Diagnosis not present

## 2019-04-25 DIAGNOSIS — N2581 Secondary hyperparathyroidism of renal origin: Secondary | ICD-10-CM | POA: Diagnosis not present

## 2019-04-26 DIAGNOSIS — E876 Hypokalemia: Secondary | ICD-10-CM | POA: Diagnosis not present

## 2019-04-26 DIAGNOSIS — Z992 Dependence on renal dialysis: Secondary | ICD-10-CM | POA: Diagnosis not present

## 2019-04-26 DIAGNOSIS — N2581 Secondary hyperparathyroidism of renal origin: Secondary | ICD-10-CM | POA: Diagnosis not present

## 2019-04-26 DIAGNOSIS — N186 End stage renal disease: Secondary | ICD-10-CM | POA: Diagnosis not present

## 2019-04-26 DIAGNOSIS — D631 Anemia in chronic kidney disease: Secondary | ICD-10-CM | POA: Diagnosis not present

## 2019-04-26 DIAGNOSIS — Z79899 Other long term (current) drug therapy: Secondary | ICD-10-CM | POA: Diagnosis not present

## 2019-04-27 DIAGNOSIS — Z992 Dependence on renal dialysis: Secondary | ICD-10-CM | POA: Diagnosis not present

## 2019-04-27 DIAGNOSIS — N186 End stage renal disease: Secondary | ICD-10-CM | POA: Diagnosis not present

## 2019-04-27 DIAGNOSIS — N2581 Secondary hyperparathyroidism of renal origin: Secondary | ICD-10-CM | POA: Diagnosis not present

## 2019-04-27 DIAGNOSIS — E876 Hypokalemia: Secondary | ICD-10-CM | POA: Diagnosis not present

## 2019-04-27 DIAGNOSIS — D631 Anemia in chronic kidney disease: Secondary | ICD-10-CM | POA: Diagnosis not present

## 2019-04-27 DIAGNOSIS — Z79899 Other long term (current) drug therapy: Secondary | ICD-10-CM | POA: Diagnosis not present

## 2019-04-28 DIAGNOSIS — E876 Hypokalemia: Secondary | ICD-10-CM | POA: Diagnosis not present

## 2019-04-28 DIAGNOSIS — D631 Anemia in chronic kidney disease: Secondary | ICD-10-CM | POA: Diagnosis not present

## 2019-04-28 DIAGNOSIS — Z79899 Other long term (current) drug therapy: Secondary | ICD-10-CM | POA: Diagnosis not present

## 2019-04-28 DIAGNOSIS — Z992 Dependence on renal dialysis: Secondary | ICD-10-CM | POA: Diagnosis not present

## 2019-04-28 DIAGNOSIS — N2581 Secondary hyperparathyroidism of renal origin: Secondary | ICD-10-CM | POA: Diagnosis not present

## 2019-04-28 DIAGNOSIS — N186 End stage renal disease: Secondary | ICD-10-CM | POA: Diagnosis not present

## 2019-04-29 DIAGNOSIS — Z992 Dependence on renal dialysis: Secondary | ICD-10-CM | POA: Diagnosis not present

## 2019-04-29 DIAGNOSIS — Z79899 Other long term (current) drug therapy: Secondary | ICD-10-CM | POA: Diagnosis not present

## 2019-04-29 DIAGNOSIS — N2581 Secondary hyperparathyroidism of renal origin: Secondary | ICD-10-CM | POA: Diagnosis not present

## 2019-04-29 DIAGNOSIS — E876 Hypokalemia: Secondary | ICD-10-CM | POA: Diagnosis not present

## 2019-04-29 DIAGNOSIS — D631 Anemia in chronic kidney disease: Secondary | ICD-10-CM | POA: Diagnosis not present

## 2019-04-29 DIAGNOSIS — N186 End stage renal disease: Secondary | ICD-10-CM | POA: Diagnosis not present

## 2019-04-30 DIAGNOSIS — N2581 Secondary hyperparathyroidism of renal origin: Secondary | ICD-10-CM | POA: Diagnosis not present

## 2019-04-30 DIAGNOSIS — N186 End stage renal disease: Secondary | ICD-10-CM | POA: Diagnosis not present

## 2019-04-30 DIAGNOSIS — D631 Anemia in chronic kidney disease: Secondary | ICD-10-CM | POA: Diagnosis not present

## 2019-04-30 DIAGNOSIS — E876 Hypokalemia: Secondary | ICD-10-CM | POA: Diagnosis not present

## 2019-04-30 DIAGNOSIS — Z992 Dependence on renal dialysis: Secondary | ICD-10-CM | POA: Diagnosis not present

## 2019-04-30 DIAGNOSIS — Z79899 Other long term (current) drug therapy: Secondary | ICD-10-CM | POA: Diagnosis not present

## 2019-05-01 DIAGNOSIS — Z79899 Other long term (current) drug therapy: Secondary | ICD-10-CM | POA: Diagnosis not present

## 2019-05-01 DIAGNOSIS — N186 End stage renal disease: Secondary | ICD-10-CM | POA: Diagnosis not present

## 2019-05-01 DIAGNOSIS — D631 Anemia in chronic kidney disease: Secondary | ICD-10-CM | POA: Diagnosis not present

## 2019-05-01 DIAGNOSIS — N2581 Secondary hyperparathyroidism of renal origin: Secondary | ICD-10-CM | POA: Diagnosis not present

## 2019-05-01 DIAGNOSIS — E876 Hypokalemia: Secondary | ICD-10-CM | POA: Diagnosis not present

## 2019-05-01 DIAGNOSIS — Z992 Dependence on renal dialysis: Secondary | ICD-10-CM | POA: Diagnosis not present

## 2019-05-02 DIAGNOSIS — E876 Hypokalemia: Secondary | ICD-10-CM | POA: Diagnosis not present

## 2019-05-02 DIAGNOSIS — Z79899 Other long term (current) drug therapy: Secondary | ICD-10-CM | POA: Diagnosis not present

## 2019-05-02 DIAGNOSIS — N186 End stage renal disease: Secondary | ICD-10-CM | POA: Diagnosis not present

## 2019-05-02 DIAGNOSIS — N2581 Secondary hyperparathyroidism of renal origin: Secondary | ICD-10-CM | POA: Diagnosis not present

## 2019-05-02 DIAGNOSIS — Z992 Dependence on renal dialysis: Secondary | ICD-10-CM | POA: Diagnosis not present

## 2019-05-02 DIAGNOSIS — D631 Anemia in chronic kidney disease: Secondary | ICD-10-CM | POA: Diagnosis not present

## 2019-05-03 DIAGNOSIS — D631 Anemia in chronic kidney disease: Secondary | ICD-10-CM | POA: Diagnosis not present

## 2019-05-03 DIAGNOSIS — Z992 Dependence on renal dialysis: Secondary | ICD-10-CM | POA: Diagnosis not present

## 2019-05-03 DIAGNOSIS — Z79899 Other long term (current) drug therapy: Secondary | ICD-10-CM | POA: Diagnosis not present

## 2019-05-03 DIAGNOSIS — N2581 Secondary hyperparathyroidism of renal origin: Secondary | ICD-10-CM | POA: Diagnosis not present

## 2019-05-03 DIAGNOSIS — E876 Hypokalemia: Secondary | ICD-10-CM | POA: Diagnosis not present

## 2019-05-03 DIAGNOSIS — N186 End stage renal disease: Secondary | ICD-10-CM | POA: Diagnosis not present

## 2019-05-04 DIAGNOSIS — N186 End stage renal disease: Secondary | ICD-10-CM | POA: Diagnosis not present

## 2019-05-04 DIAGNOSIS — N2581 Secondary hyperparathyroidism of renal origin: Secondary | ICD-10-CM | POA: Diagnosis not present

## 2019-05-04 DIAGNOSIS — Z992 Dependence on renal dialysis: Secondary | ICD-10-CM | POA: Diagnosis not present

## 2019-05-04 DIAGNOSIS — E876 Hypokalemia: Secondary | ICD-10-CM | POA: Diagnosis not present

## 2019-05-04 DIAGNOSIS — D631 Anemia in chronic kidney disease: Secondary | ICD-10-CM | POA: Diagnosis not present

## 2019-05-04 DIAGNOSIS — Z79899 Other long term (current) drug therapy: Secondary | ICD-10-CM | POA: Diagnosis not present

## 2019-05-05 DIAGNOSIS — K769 Liver disease, unspecified: Secondary | ICD-10-CM | POA: Diagnosis not present

## 2019-05-05 DIAGNOSIS — D631 Anemia in chronic kidney disease: Secondary | ICD-10-CM | POA: Diagnosis not present

## 2019-05-05 DIAGNOSIS — D509 Iron deficiency anemia, unspecified: Secondary | ICD-10-CM | POA: Diagnosis not present

## 2019-05-05 DIAGNOSIS — N2589 Other disorders resulting from impaired renal tubular function: Secondary | ICD-10-CM | POA: Diagnosis not present

## 2019-05-05 DIAGNOSIS — Z992 Dependence on renal dialysis: Secondary | ICD-10-CM | POA: Diagnosis not present

## 2019-05-05 DIAGNOSIS — N186 End stage renal disease: Secondary | ICD-10-CM | POA: Diagnosis not present

## 2019-05-05 DIAGNOSIS — I129 Hypertensive chronic kidney disease with stage 1 through stage 4 chronic kidney disease, or unspecified chronic kidney disease: Secondary | ICD-10-CM | POA: Diagnosis not present

## 2019-05-05 DIAGNOSIS — Z4932 Encounter for adequacy testing for peritoneal dialysis: Secondary | ICD-10-CM | POA: Diagnosis not present

## 2019-05-05 DIAGNOSIS — N2581 Secondary hyperparathyroidism of renal origin: Secondary | ICD-10-CM | POA: Diagnosis not present

## 2019-05-05 DIAGNOSIS — Z23 Encounter for immunization: Secondary | ICD-10-CM | POA: Diagnosis not present

## 2019-05-06 DIAGNOSIS — K769 Liver disease, unspecified: Secondary | ICD-10-CM | POA: Diagnosis not present

## 2019-05-06 DIAGNOSIS — Z992 Dependence on renal dialysis: Secondary | ICD-10-CM | POA: Diagnosis not present

## 2019-05-06 DIAGNOSIS — N2589 Other disorders resulting from impaired renal tubular function: Secondary | ICD-10-CM | POA: Diagnosis not present

## 2019-05-06 DIAGNOSIS — N186 End stage renal disease: Secondary | ICD-10-CM | POA: Diagnosis not present

## 2019-05-06 DIAGNOSIS — N2581 Secondary hyperparathyroidism of renal origin: Secondary | ICD-10-CM | POA: Diagnosis not present

## 2019-05-06 DIAGNOSIS — D509 Iron deficiency anemia, unspecified: Secondary | ICD-10-CM | POA: Diagnosis not present

## 2019-05-07 DIAGNOSIS — Z992 Dependence on renal dialysis: Secondary | ICD-10-CM | POA: Diagnosis not present

## 2019-05-07 DIAGNOSIS — D509 Iron deficiency anemia, unspecified: Secondary | ICD-10-CM | POA: Diagnosis not present

## 2019-05-07 DIAGNOSIS — N186 End stage renal disease: Secondary | ICD-10-CM | POA: Diagnosis not present

## 2019-05-07 DIAGNOSIS — K769 Liver disease, unspecified: Secondary | ICD-10-CM | POA: Diagnosis not present

## 2019-05-07 DIAGNOSIS — N2589 Other disorders resulting from impaired renal tubular function: Secondary | ICD-10-CM | POA: Diagnosis not present

## 2019-05-07 DIAGNOSIS — N2581 Secondary hyperparathyroidism of renal origin: Secondary | ICD-10-CM | POA: Diagnosis not present

## 2019-05-08 DIAGNOSIS — N2589 Other disorders resulting from impaired renal tubular function: Secondary | ICD-10-CM | POA: Diagnosis not present

## 2019-05-08 DIAGNOSIS — Z992 Dependence on renal dialysis: Secondary | ICD-10-CM | POA: Diagnosis not present

## 2019-05-08 DIAGNOSIS — K769 Liver disease, unspecified: Secondary | ICD-10-CM | POA: Diagnosis not present

## 2019-05-08 DIAGNOSIS — N2581 Secondary hyperparathyroidism of renal origin: Secondary | ICD-10-CM | POA: Diagnosis not present

## 2019-05-08 DIAGNOSIS — N186 End stage renal disease: Secondary | ICD-10-CM | POA: Diagnosis not present

## 2019-05-08 DIAGNOSIS — D509 Iron deficiency anemia, unspecified: Secondary | ICD-10-CM | POA: Diagnosis not present

## 2019-05-09 DIAGNOSIS — N186 End stage renal disease: Secondary | ICD-10-CM | POA: Diagnosis not present

## 2019-05-09 DIAGNOSIS — D509 Iron deficiency anemia, unspecified: Secondary | ICD-10-CM | POA: Diagnosis not present

## 2019-05-09 DIAGNOSIS — K769 Liver disease, unspecified: Secondary | ICD-10-CM | POA: Diagnosis not present

## 2019-05-09 DIAGNOSIS — Z992 Dependence on renal dialysis: Secondary | ICD-10-CM | POA: Diagnosis not present

## 2019-05-09 DIAGNOSIS — N2589 Other disorders resulting from impaired renal tubular function: Secondary | ICD-10-CM | POA: Diagnosis not present

## 2019-05-09 DIAGNOSIS — N2581 Secondary hyperparathyroidism of renal origin: Secondary | ICD-10-CM | POA: Diagnosis not present

## 2019-05-10 DIAGNOSIS — N2581 Secondary hyperparathyroidism of renal origin: Secondary | ICD-10-CM | POA: Diagnosis not present

## 2019-05-10 DIAGNOSIS — N2589 Other disorders resulting from impaired renal tubular function: Secondary | ICD-10-CM | POA: Diagnosis not present

## 2019-05-10 DIAGNOSIS — D509 Iron deficiency anemia, unspecified: Secondary | ICD-10-CM | POA: Diagnosis not present

## 2019-05-10 DIAGNOSIS — Z992 Dependence on renal dialysis: Secondary | ICD-10-CM | POA: Diagnosis not present

## 2019-05-10 DIAGNOSIS — N186 End stage renal disease: Secondary | ICD-10-CM | POA: Diagnosis not present

## 2019-05-10 DIAGNOSIS — K769 Liver disease, unspecified: Secondary | ICD-10-CM | POA: Diagnosis not present

## 2019-05-11 DIAGNOSIS — N2589 Other disorders resulting from impaired renal tubular function: Secondary | ICD-10-CM | POA: Diagnosis not present

## 2019-05-11 DIAGNOSIS — E7849 Other hyperlipidemia: Secondary | ICD-10-CM | POA: Diagnosis not present

## 2019-05-11 DIAGNOSIS — N186 End stage renal disease: Secondary | ICD-10-CM | POA: Diagnosis not present

## 2019-05-11 DIAGNOSIS — Z4932 Encounter for adequacy testing for peritoneal dialysis: Secondary | ICD-10-CM | POA: Diagnosis not present

## 2019-05-11 DIAGNOSIS — Z992 Dependence on renal dialysis: Secondary | ICD-10-CM | POA: Diagnosis not present

## 2019-05-11 DIAGNOSIS — E1129 Type 2 diabetes mellitus with other diabetic kidney complication: Secondary | ICD-10-CM | POA: Diagnosis not present

## 2019-05-11 DIAGNOSIS — R82998 Other abnormal findings in urine: Secondary | ICD-10-CM | POA: Diagnosis not present

## 2019-05-11 DIAGNOSIS — D509 Iron deficiency anemia, unspecified: Secondary | ICD-10-CM | POA: Diagnosis not present

## 2019-05-11 DIAGNOSIS — K769 Liver disease, unspecified: Secondary | ICD-10-CM | POA: Diagnosis not present

## 2019-05-11 DIAGNOSIS — N2581 Secondary hyperparathyroidism of renal origin: Secondary | ICD-10-CM | POA: Diagnosis not present

## 2019-05-12 DIAGNOSIS — N2589 Other disorders resulting from impaired renal tubular function: Secondary | ICD-10-CM | POA: Diagnosis not present

## 2019-05-12 DIAGNOSIS — D509 Iron deficiency anemia, unspecified: Secondary | ICD-10-CM | POA: Diagnosis not present

## 2019-05-12 DIAGNOSIS — Z992 Dependence on renal dialysis: Secondary | ICD-10-CM | POA: Diagnosis not present

## 2019-05-12 DIAGNOSIS — K769 Liver disease, unspecified: Secondary | ICD-10-CM | POA: Diagnosis not present

## 2019-05-12 DIAGNOSIS — N186 End stage renal disease: Secondary | ICD-10-CM | POA: Diagnosis not present

## 2019-05-12 DIAGNOSIS — N2581 Secondary hyperparathyroidism of renal origin: Secondary | ICD-10-CM | POA: Diagnosis not present

## 2019-05-13 DIAGNOSIS — D509 Iron deficiency anemia, unspecified: Secondary | ICD-10-CM | POA: Diagnosis not present

## 2019-05-13 DIAGNOSIS — Z992 Dependence on renal dialysis: Secondary | ICD-10-CM | POA: Diagnosis not present

## 2019-05-13 DIAGNOSIS — N2589 Other disorders resulting from impaired renal tubular function: Secondary | ICD-10-CM | POA: Diagnosis not present

## 2019-05-13 DIAGNOSIS — N2581 Secondary hyperparathyroidism of renal origin: Secondary | ICD-10-CM | POA: Diagnosis not present

## 2019-05-13 DIAGNOSIS — K769 Liver disease, unspecified: Secondary | ICD-10-CM | POA: Diagnosis not present

## 2019-05-13 DIAGNOSIS — N186 End stage renal disease: Secondary | ICD-10-CM | POA: Diagnosis not present

## 2019-05-14 DIAGNOSIS — N186 End stage renal disease: Secondary | ICD-10-CM | POA: Diagnosis not present

## 2019-05-14 DIAGNOSIS — K769 Liver disease, unspecified: Secondary | ICD-10-CM | POA: Diagnosis not present

## 2019-05-14 DIAGNOSIS — N2581 Secondary hyperparathyroidism of renal origin: Secondary | ICD-10-CM | POA: Diagnosis not present

## 2019-05-14 DIAGNOSIS — D509 Iron deficiency anemia, unspecified: Secondary | ICD-10-CM | POA: Diagnosis not present

## 2019-05-14 DIAGNOSIS — Z992 Dependence on renal dialysis: Secondary | ICD-10-CM | POA: Diagnosis not present

## 2019-05-14 DIAGNOSIS — N2589 Other disorders resulting from impaired renal tubular function: Secondary | ICD-10-CM | POA: Diagnosis not present

## 2019-05-15 DIAGNOSIS — N2589 Other disorders resulting from impaired renal tubular function: Secondary | ICD-10-CM | POA: Diagnosis not present

## 2019-05-15 DIAGNOSIS — N2581 Secondary hyperparathyroidism of renal origin: Secondary | ICD-10-CM | POA: Diagnosis not present

## 2019-05-15 DIAGNOSIS — D509 Iron deficiency anemia, unspecified: Secondary | ICD-10-CM | POA: Diagnosis not present

## 2019-05-15 DIAGNOSIS — N186 End stage renal disease: Secondary | ICD-10-CM | POA: Diagnosis not present

## 2019-05-15 DIAGNOSIS — Z992 Dependence on renal dialysis: Secondary | ICD-10-CM | POA: Diagnosis not present

## 2019-05-15 DIAGNOSIS — K769 Liver disease, unspecified: Secondary | ICD-10-CM | POA: Diagnosis not present

## 2019-05-16 DIAGNOSIS — N186 End stage renal disease: Secondary | ICD-10-CM | POA: Diagnosis not present

## 2019-05-16 DIAGNOSIS — Z992 Dependence on renal dialysis: Secondary | ICD-10-CM | POA: Diagnosis not present

## 2019-05-16 DIAGNOSIS — D509 Iron deficiency anemia, unspecified: Secondary | ICD-10-CM | POA: Diagnosis not present

## 2019-05-16 DIAGNOSIS — K769 Liver disease, unspecified: Secondary | ICD-10-CM | POA: Diagnosis not present

## 2019-05-16 DIAGNOSIS — N2581 Secondary hyperparathyroidism of renal origin: Secondary | ICD-10-CM | POA: Diagnosis not present

## 2019-05-16 DIAGNOSIS — N2589 Other disorders resulting from impaired renal tubular function: Secondary | ICD-10-CM | POA: Diagnosis not present

## 2019-05-17 DIAGNOSIS — N2589 Other disorders resulting from impaired renal tubular function: Secondary | ICD-10-CM | POA: Diagnosis not present

## 2019-05-17 DIAGNOSIS — Z992 Dependence on renal dialysis: Secondary | ICD-10-CM | POA: Diagnosis not present

## 2019-05-17 DIAGNOSIS — N2581 Secondary hyperparathyroidism of renal origin: Secondary | ICD-10-CM | POA: Diagnosis not present

## 2019-05-17 DIAGNOSIS — K769 Liver disease, unspecified: Secondary | ICD-10-CM | POA: Diagnosis not present

## 2019-05-17 DIAGNOSIS — N186 End stage renal disease: Secondary | ICD-10-CM | POA: Diagnosis not present

## 2019-05-17 DIAGNOSIS — D509 Iron deficiency anemia, unspecified: Secondary | ICD-10-CM | POA: Diagnosis not present

## 2019-05-18 DIAGNOSIS — N186 End stage renal disease: Secondary | ICD-10-CM | POA: Diagnosis not present

## 2019-05-18 DIAGNOSIS — N2581 Secondary hyperparathyroidism of renal origin: Secondary | ICD-10-CM | POA: Diagnosis not present

## 2019-05-18 DIAGNOSIS — D509 Iron deficiency anemia, unspecified: Secondary | ICD-10-CM | POA: Diagnosis not present

## 2019-05-18 DIAGNOSIS — Z992 Dependence on renal dialysis: Secondary | ICD-10-CM | POA: Diagnosis not present

## 2019-05-18 DIAGNOSIS — K769 Liver disease, unspecified: Secondary | ICD-10-CM | POA: Diagnosis not present

## 2019-05-18 DIAGNOSIS — N2589 Other disorders resulting from impaired renal tubular function: Secondary | ICD-10-CM | POA: Diagnosis not present

## 2019-05-19 DIAGNOSIS — K769 Liver disease, unspecified: Secondary | ICD-10-CM | POA: Diagnosis not present

## 2019-05-19 DIAGNOSIS — N2589 Other disorders resulting from impaired renal tubular function: Secondary | ICD-10-CM | POA: Diagnosis not present

## 2019-05-19 DIAGNOSIS — Z992 Dependence on renal dialysis: Secondary | ICD-10-CM | POA: Diagnosis not present

## 2019-05-19 DIAGNOSIS — N186 End stage renal disease: Secondary | ICD-10-CM | POA: Diagnosis not present

## 2019-05-19 DIAGNOSIS — D509 Iron deficiency anemia, unspecified: Secondary | ICD-10-CM | POA: Diagnosis not present

## 2019-05-19 DIAGNOSIS — N2581 Secondary hyperparathyroidism of renal origin: Secondary | ICD-10-CM | POA: Diagnosis not present

## 2019-05-20 DIAGNOSIS — K769 Liver disease, unspecified: Secondary | ICD-10-CM | POA: Diagnosis not present

## 2019-05-20 DIAGNOSIS — N2581 Secondary hyperparathyroidism of renal origin: Secondary | ICD-10-CM | POA: Diagnosis not present

## 2019-05-20 DIAGNOSIS — N2589 Other disorders resulting from impaired renal tubular function: Secondary | ICD-10-CM | POA: Diagnosis not present

## 2019-05-20 DIAGNOSIS — D509 Iron deficiency anemia, unspecified: Secondary | ICD-10-CM | POA: Diagnosis not present

## 2019-05-20 DIAGNOSIS — Z992 Dependence on renal dialysis: Secondary | ICD-10-CM | POA: Diagnosis not present

## 2019-05-20 DIAGNOSIS — N186 End stage renal disease: Secondary | ICD-10-CM | POA: Diagnosis not present

## 2019-05-21 DIAGNOSIS — N2581 Secondary hyperparathyroidism of renal origin: Secondary | ICD-10-CM | POA: Diagnosis not present

## 2019-05-21 DIAGNOSIS — D509 Iron deficiency anemia, unspecified: Secondary | ICD-10-CM | POA: Diagnosis not present

## 2019-05-21 DIAGNOSIS — Z992 Dependence on renal dialysis: Secondary | ICD-10-CM | POA: Diagnosis not present

## 2019-05-21 DIAGNOSIS — N2589 Other disorders resulting from impaired renal tubular function: Secondary | ICD-10-CM | POA: Diagnosis not present

## 2019-05-21 DIAGNOSIS — K769 Liver disease, unspecified: Secondary | ICD-10-CM | POA: Diagnosis not present

## 2019-05-21 DIAGNOSIS — N186 End stage renal disease: Secondary | ICD-10-CM | POA: Diagnosis not present

## 2019-05-22 DIAGNOSIS — N2589 Other disorders resulting from impaired renal tubular function: Secondary | ICD-10-CM | POA: Diagnosis not present

## 2019-05-22 DIAGNOSIS — N2581 Secondary hyperparathyroidism of renal origin: Secondary | ICD-10-CM | POA: Diagnosis not present

## 2019-05-22 DIAGNOSIS — Z992 Dependence on renal dialysis: Secondary | ICD-10-CM | POA: Diagnosis not present

## 2019-05-22 DIAGNOSIS — N186 End stage renal disease: Secondary | ICD-10-CM | POA: Diagnosis not present

## 2019-05-22 DIAGNOSIS — K769 Liver disease, unspecified: Secondary | ICD-10-CM | POA: Diagnosis not present

## 2019-05-22 DIAGNOSIS — D509 Iron deficiency anemia, unspecified: Secondary | ICD-10-CM | POA: Diagnosis not present

## 2019-05-23 DIAGNOSIS — N2581 Secondary hyperparathyroidism of renal origin: Secondary | ICD-10-CM | POA: Diagnosis not present

## 2019-05-23 DIAGNOSIS — N2589 Other disorders resulting from impaired renal tubular function: Secondary | ICD-10-CM | POA: Diagnosis not present

## 2019-05-23 DIAGNOSIS — Z992 Dependence on renal dialysis: Secondary | ICD-10-CM | POA: Diagnosis not present

## 2019-05-23 DIAGNOSIS — K769 Liver disease, unspecified: Secondary | ICD-10-CM | POA: Diagnosis not present

## 2019-05-23 DIAGNOSIS — D509 Iron deficiency anemia, unspecified: Secondary | ICD-10-CM | POA: Diagnosis not present

## 2019-05-23 DIAGNOSIS — N186 End stage renal disease: Secondary | ICD-10-CM | POA: Diagnosis not present

## 2019-05-24 DIAGNOSIS — N2589 Other disorders resulting from impaired renal tubular function: Secondary | ICD-10-CM | POA: Diagnosis not present

## 2019-05-24 DIAGNOSIS — N2581 Secondary hyperparathyroidism of renal origin: Secondary | ICD-10-CM | POA: Diagnosis not present

## 2019-05-24 DIAGNOSIS — K769 Liver disease, unspecified: Secondary | ICD-10-CM | POA: Diagnosis not present

## 2019-05-24 DIAGNOSIS — Z992 Dependence on renal dialysis: Secondary | ICD-10-CM | POA: Diagnosis not present

## 2019-05-24 DIAGNOSIS — D509 Iron deficiency anemia, unspecified: Secondary | ICD-10-CM | POA: Diagnosis not present

## 2019-05-24 DIAGNOSIS — N186 End stage renal disease: Secondary | ICD-10-CM | POA: Diagnosis not present

## 2019-05-25 DIAGNOSIS — K769 Liver disease, unspecified: Secondary | ICD-10-CM | POA: Diagnosis not present

## 2019-05-25 DIAGNOSIS — Z992 Dependence on renal dialysis: Secondary | ICD-10-CM | POA: Diagnosis not present

## 2019-05-25 DIAGNOSIS — N2581 Secondary hyperparathyroidism of renal origin: Secondary | ICD-10-CM | POA: Diagnosis not present

## 2019-05-25 DIAGNOSIS — N186 End stage renal disease: Secondary | ICD-10-CM | POA: Diagnosis not present

## 2019-05-25 DIAGNOSIS — D509 Iron deficiency anemia, unspecified: Secondary | ICD-10-CM | POA: Diagnosis not present

## 2019-05-25 DIAGNOSIS — N2589 Other disorders resulting from impaired renal tubular function: Secondary | ICD-10-CM | POA: Diagnosis not present

## 2019-05-26 DIAGNOSIS — Z992 Dependence on renal dialysis: Secondary | ICD-10-CM | POA: Diagnosis not present

## 2019-05-26 DIAGNOSIS — N2581 Secondary hyperparathyroidism of renal origin: Secondary | ICD-10-CM | POA: Diagnosis not present

## 2019-05-26 DIAGNOSIS — K769 Liver disease, unspecified: Secondary | ICD-10-CM | POA: Diagnosis not present

## 2019-05-26 DIAGNOSIS — N186 End stage renal disease: Secondary | ICD-10-CM | POA: Diagnosis not present

## 2019-05-26 DIAGNOSIS — D509 Iron deficiency anemia, unspecified: Secondary | ICD-10-CM | POA: Diagnosis not present

## 2019-05-26 DIAGNOSIS — N2589 Other disorders resulting from impaired renal tubular function: Secondary | ICD-10-CM | POA: Diagnosis not present

## 2019-05-27 DIAGNOSIS — N2589 Other disorders resulting from impaired renal tubular function: Secondary | ICD-10-CM | POA: Diagnosis not present

## 2019-05-27 DIAGNOSIS — D509 Iron deficiency anemia, unspecified: Secondary | ICD-10-CM | POA: Diagnosis not present

## 2019-05-27 DIAGNOSIS — K769 Liver disease, unspecified: Secondary | ICD-10-CM | POA: Diagnosis not present

## 2019-05-27 DIAGNOSIS — N186 End stage renal disease: Secondary | ICD-10-CM | POA: Diagnosis not present

## 2019-05-27 DIAGNOSIS — N2581 Secondary hyperparathyroidism of renal origin: Secondary | ICD-10-CM | POA: Diagnosis not present

## 2019-05-27 DIAGNOSIS — Z992 Dependence on renal dialysis: Secondary | ICD-10-CM | POA: Diagnosis not present

## 2019-05-28 DIAGNOSIS — N2581 Secondary hyperparathyroidism of renal origin: Secondary | ICD-10-CM | POA: Diagnosis not present

## 2019-05-28 DIAGNOSIS — N2589 Other disorders resulting from impaired renal tubular function: Secondary | ICD-10-CM | POA: Diagnosis not present

## 2019-05-28 DIAGNOSIS — K769 Liver disease, unspecified: Secondary | ICD-10-CM | POA: Diagnosis not present

## 2019-05-28 DIAGNOSIS — N186 End stage renal disease: Secondary | ICD-10-CM | POA: Diagnosis not present

## 2019-05-28 DIAGNOSIS — D509 Iron deficiency anemia, unspecified: Secondary | ICD-10-CM | POA: Diagnosis not present

## 2019-05-28 DIAGNOSIS — Z992 Dependence on renal dialysis: Secondary | ICD-10-CM | POA: Diagnosis not present

## 2019-05-29 DIAGNOSIS — K769 Liver disease, unspecified: Secondary | ICD-10-CM | POA: Diagnosis not present

## 2019-05-29 DIAGNOSIS — N2589 Other disorders resulting from impaired renal tubular function: Secondary | ICD-10-CM | POA: Diagnosis not present

## 2019-05-29 DIAGNOSIS — Z992 Dependence on renal dialysis: Secondary | ICD-10-CM | POA: Diagnosis not present

## 2019-05-29 DIAGNOSIS — N2581 Secondary hyperparathyroidism of renal origin: Secondary | ICD-10-CM | POA: Diagnosis not present

## 2019-05-29 DIAGNOSIS — N186 End stage renal disease: Secondary | ICD-10-CM | POA: Diagnosis not present

## 2019-05-29 DIAGNOSIS — D509 Iron deficiency anemia, unspecified: Secondary | ICD-10-CM | POA: Diagnosis not present

## 2019-05-30 DIAGNOSIS — N186 End stage renal disease: Secondary | ICD-10-CM | POA: Diagnosis not present

## 2019-05-30 DIAGNOSIS — N2581 Secondary hyperparathyroidism of renal origin: Secondary | ICD-10-CM | POA: Diagnosis not present

## 2019-05-30 DIAGNOSIS — N2589 Other disorders resulting from impaired renal tubular function: Secondary | ICD-10-CM | POA: Diagnosis not present

## 2019-05-30 DIAGNOSIS — Z992 Dependence on renal dialysis: Secondary | ICD-10-CM | POA: Diagnosis not present

## 2019-05-30 DIAGNOSIS — D509 Iron deficiency anemia, unspecified: Secondary | ICD-10-CM | POA: Diagnosis not present

## 2019-05-30 DIAGNOSIS — K769 Liver disease, unspecified: Secondary | ICD-10-CM | POA: Diagnosis not present

## 2019-05-31 DIAGNOSIS — Z992 Dependence on renal dialysis: Secondary | ICD-10-CM | POA: Diagnosis not present

## 2019-05-31 DIAGNOSIS — N186 End stage renal disease: Secondary | ICD-10-CM | POA: Diagnosis not present

## 2019-05-31 DIAGNOSIS — N2589 Other disorders resulting from impaired renal tubular function: Secondary | ICD-10-CM | POA: Diagnosis not present

## 2019-05-31 DIAGNOSIS — D509 Iron deficiency anemia, unspecified: Secondary | ICD-10-CM | POA: Diagnosis not present

## 2019-05-31 DIAGNOSIS — N2581 Secondary hyperparathyroidism of renal origin: Secondary | ICD-10-CM | POA: Diagnosis not present

## 2019-05-31 DIAGNOSIS — K769 Liver disease, unspecified: Secondary | ICD-10-CM | POA: Diagnosis not present

## 2019-06-01 DIAGNOSIS — K769 Liver disease, unspecified: Secondary | ICD-10-CM | POA: Diagnosis not present

## 2019-06-01 DIAGNOSIS — N186 End stage renal disease: Secondary | ICD-10-CM | POA: Diagnosis not present

## 2019-06-01 DIAGNOSIS — Z992 Dependence on renal dialysis: Secondary | ICD-10-CM | POA: Diagnosis not present

## 2019-06-01 DIAGNOSIS — N2589 Other disorders resulting from impaired renal tubular function: Secondary | ICD-10-CM | POA: Diagnosis not present

## 2019-06-01 DIAGNOSIS — N2581 Secondary hyperparathyroidism of renal origin: Secondary | ICD-10-CM | POA: Diagnosis not present

## 2019-06-01 DIAGNOSIS — D509 Iron deficiency anemia, unspecified: Secondary | ICD-10-CM | POA: Diagnosis not present

## 2019-06-02 DIAGNOSIS — N2589 Other disorders resulting from impaired renal tubular function: Secondary | ICD-10-CM | POA: Diagnosis not present

## 2019-06-02 DIAGNOSIS — K769 Liver disease, unspecified: Secondary | ICD-10-CM | POA: Diagnosis not present

## 2019-06-02 DIAGNOSIS — D509 Iron deficiency anemia, unspecified: Secondary | ICD-10-CM | POA: Diagnosis not present

## 2019-06-02 DIAGNOSIS — N2581 Secondary hyperparathyroidism of renal origin: Secondary | ICD-10-CM | POA: Diagnosis not present

## 2019-06-02 DIAGNOSIS — N186 End stage renal disease: Secondary | ICD-10-CM | POA: Diagnosis not present

## 2019-06-02 DIAGNOSIS — Z992 Dependence on renal dialysis: Secondary | ICD-10-CM | POA: Diagnosis not present

## 2019-06-03 DIAGNOSIS — K769 Liver disease, unspecified: Secondary | ICD-10-CM | POA: Diagnosis not present

## 2019-06-03 DIAGNOSIS — D509 Iron deficiency anemia, unspecified: Secondary | ICD-10-CM | POA: Diagnosis not present

## 2019-06-03 DIAGNOSIS — Z992 Dependence on renal dialysis: Secondary | ICD-10-CM | POA: Diagnosis not present

## 2019-06-03 DIAGNOSIS — N2581 Secondary hyperparathyroidism of renal origin: Secondary | ICD-10-CM | POA: Diagnosis not present

## 2019-06-03 DIAGNOSIS — N186 End stage renal disease: Secondary | ICD-10-CM | POA: Diagnosis not present

## 2019-06-03 DIAGNOSIS — N2589 Other disorders resulting from impaired renal tubular function: Secondary | ICD-10-CM | POA: Diagnosis not present

## 2019-06-04 DIAGNOSIS — K769 Liver disease, unspecified: Secondary | ICD-10-CM | POA: Diagnosis not present

## 2019-06-04 DIAGNOSIS — N186 End stage renal disease: Secondary | ICD-10-CM | POA: Diagnosis not present

## 2019-06-04 DIAGNOSIS — D509 Iron deficiency anemia, unspecified: Secondary | ICD-10-CM | POA: Diagnosis not present

## 2019-06-04 DIAGNOSIS — Z992 Dependence on renal dialysis: Secondary | ICD-10-CM | POA: Diagnosis not present

## 2019-06-04 DIAGNOSIS — N2581 Secondary hyperparathyroidism of renal origin: Secondary | ICD-10-CM | POA: Diagnosis not present

## 2019-06-04 DIAGNOSIS — N2589 Other disorders resulting from impaired renal tubular function: Secondary | ICD-10-CM | POA: Diagnosis not present

## 2019-06-05 DIAGNOSIS — E440 Moderate protein-calorie malnutrition: Secondary | ICD-10-CM | POA: Diagnosis not present

## 2019-06-05 DIAGNOSIS — D509 Iron deficiency anemia, unspecified: Secondary | ICD-10-CM | POA: Diagnosis not present

## 2019-06-05 DIAGNOSIS — N186 End stage renal disease: Secondary | ICD-10-CM | POA: Diagnosis not present

## 2019-06-05 DIAGNOSIS — I129 Hypertensive chronic kidney disease with stage 1 through stage 4 chronic kidney disease, or unspecified chronic kidney disease: Secondary | ICD-10-CM | POA: Diagnosis not present

## 2019-06-05 DIAGNOSIS — N2581 Secondary hyperparathyroidism of renal origin: Secondary | ICD-10-CM | POA: Diagnosis not present

## 2019-06-05 DIAGNOSIS — D631 Anemia in chronic kidney disease: Secondary | ICD-10-CM | POA: Diagnosis not present

## 2019-06-05 DIAGNOSIS — R17 Unspecified jaundice: Secondary | ICD-10-CM | POA: Diagnosis not present

## 2019-06-05 DIAGNOSIS — Z992 Dependence on renal dialysis: Secondary | ICD-10-CM | POA: Diagnosis not present

## 2019-06-05 DIAGNOSIS — K769 Liver disease, unspecified: Secondary | ICD-10-CM | POA: Diagnosis not present

## 2019-06-05 DIAGNOSIS — Z79899 Other long term (current) drug therapy: Secondary | ICD-10-CM | POA: Diagnosis not present

## 2019-06-06 DIAGNOSIS — N2581 Secondary hyperparathyroidism of renal origin: Secondary | ICD-10-CM | POA: Diagnosis not present

## 2019-06-06 DIAGNOSIS — N186 End stage renal disease: Secondary | ICD-10-CM | POA: Diagnosis not present

## 2019-06-06 DIAGNOSIS — Z992 Dependence on renal dialysis: Secondary | ICD-10-CM | POA: Diagnosis not present

## 2019-06-06 DIAGNOSIS — K769 Liver disease, unspecified: Secondary | ICD-10-CM | POA: Diagnosis not present

## 2019-06-06 DIAGNOSIS — R17 Unspecified jaundice: Secondary | ICD-10-CM | POA: Diagnosis not present

## 2019-06-06 DIAGNOSIS — D631 Anemia in chronic kidney disease: Secondary | ICD-10-CM | POA: Diagnosis not present

## 2019-06-07 DIAGNOSIS — D631 Anemia in chronic kidney disease: Secondary | ICD-10-CM | POA: Diagnosis not present

## 2019-06-07 DIAGNOSIS — R17 Unspecified jaundice: Secondary | ICD-10-CM | POA: Diagnosis not present

## 2019-06-07 DIAGNOSIS — K769 Liver disease, unspecified: Secondary | ICD-10-CM | POA: Diagnosis not present

## 2019-06-07 DIAGNOSIS — N186 End stage renal disease: Secondary | ICD-10-CM | POA: Diagnosis not present

## 2019-06-07 DIAGNOSIS — N2581 Secondary hyperparathyroidism of renal origin: Secondary | ICD-10-CM | POA: Diagnosis not present

## 2019-06-07 DIAGNOSIS — Z992 Dependence on renal dialysis: Secondary | ICD-10-CM | POA: Diagnosis not present

## 2019-06-08 DIAGNOSIS — N186 End stage renal disease: Secondary | ICD-10-CM | POA: Diagnosis not present

## 2019-06-08 DIAGNOSIS — N2581 Secondary hyperparathyroidism of renal origin: Secondary | ICD-10-CM | POA: Diagnosis not present

## 2019-06-08 DIAGNOSIS — K769 Liver disease, unspecified: Secondary | ICD-10-CM | POA: Diagnosis not present

## 2019-06-08 DIAGNOSIS — D631 Anemia in chronic kidney disease: Secondary | ICD-10-CM | POA: Diagnosis not present

## 2019-06-08 DIAGNOSIS — Z992 Dependence on renal dialysis: Secondary | ICD-10-CM | POA: Diagnosis not present

## 2019-06-08 DIAGNOSIS — R17 Unspecified jaundice: Secondary | ICD-10-CM | POA: Diagnosis not present

## 2019-06-09 DIAGNOSIS — Z992 Dependence on renal dialysis: Secondary | ICD-10-CM | POA: Diagnosis not present

## 2019-06-09 DIAGNOSIS — R82998 Other abnormal findings in urine: Secondary | ICD-10-CM | POA: Diagnosis not present

## 2019-06-09 DIAGNOSIS — N186 End stage renal disease: Secondary | ICD-10-CM | POA: Diagnosis not present

## 2019-06-09 DIAGNOSIS — D631 Anemia in chronic kidney disease: Secondary | ICD-10-CM | POA: Diagnosis not present

## 2019-06-09 DIAGNOSIS — N2581 Secondary hyperparathyroidism of renal origin: Secondary | ICD-10-CM | POA: Diagnosis not present

## 2019-06-09 DIAGNOSIS — K769 Liver disease, unspecified: Secondary | ICD-10-CM | POA: Diagnosis not present

## 2019-06-09 DIAGNOSIS — R17 Unspecified jaundice: Secondary | ICD-10-CM | POA: Diagnosis not present

## 2019-06-10 DIAGNOSIS — D631 Anemia in chronic kidney disease: Secondary | ICD-10-CM | POA: Diagnosis not present

## 2019-06-10 DIAGNOSIS — K769 Liver disease, unspecified: Secondary | ICD-10-CM | POA: Diagnosis not present

## 2019-06-10 DIAGNOSIS — Z992 Dependence on renal dialysis: Secondary | ICD-10-CM | POA: Diagnosis not present

## 2019-06-10 DIAGNOSIS — R17 Unspecified jaundice: Secondary | ICD-10-CM | POA: Diagnosis not present

## 2019-06-10 DIAGNOSIS — N2581 Secondary hyperparathyroidism of renal origin: Secondary | ICD-10-CM | POA: Diagnosis not present

## 2019-06-10 DIAGNOSIS — N186 End stage renal disease: Secondary | ICD-10-CM | POA: Diagnosis not present

## 2019-06-11 DIAGNOSIS — N2581 Secondary hyperparathyroidism of renal origin: Secondary | ICD-10-CM | POA: Diagnosis not present

## 2019-06-11 DIAGNOSIS — K769 Liver disease, unspecified: Secondary | ICD-10-CM | POA: Diagnosis not present

## 2019-06-11 DIAGNOSIS — N186 End stage renal disease: Secondary | ICD-10-CM | POA: Diagnosis not present

## 2019-06-11 DIAGNOSIS — Z992 Dependence on renal dialysis: Secondary | ICD-10-CM | POA: Diagnosis not present

## 2019-06-11 DIAGNOSIS — D631 Anemia in chronic kidney disease: Secondary | ICD-10-CM | POA: Diagnosis not present

## 2019-06-11 DIAGNOSIS — R17 Unspecified jaundice: Secondary | ICD-10-CM | POA: Diagnosis not present

## 2019-06-12 DIAGNOSIS — R17 Unspecified jaundice: Secondary | ICD-10-CM | POA: Diagnosis not present

## 2019-06-12 DIAGNOSIS — D631 Anemia in chronic kidney disease: Secondary | ICD-10-CM | POA: Diagnosis not present

## 2019-06-12 DIAGNOSIS — N2581 Secondary hyperparathyroidism of renal origin: Secondary | ICD-10-CM | POA: Diagnosis not present

## 2019-06-12 DIAGNOSIS — N186 End stage renal disease: Secondary | ICD-10-CM | POA: Diagnosis not present

## 2019-06-12 DIAGNOSIS — K769 Liver disease, unspecified: Secondary | ICD-10-CM | POA: Diagnosis not present

## 2019-06-12 DIAGNOSIS — Z992 Dependence on renal dialysis: Secondary | ICD-10-CM | POA: Diagnosis not present

## 2019-06-13 DIAGNOSIS — N2581 Secondary hyperparathyroidism of renal origin: Secondary | ICD-10-CM | POA: Diagnosis not present

## 2019-06-13 DIAGNOSIS — R17 Unspecified jaundice: Secondary | ICD-10-CM | POA: Diagnosis not present

## 2019-06-13 DIAGNOSIS — N186 End stage renal disease: Secondary | ICD-10-CM | POA: Diagnosis not present

## 2019-06-13 DIAGNOSIS — D631 Anemia in chronic kidney disease: Secondary | ICD-10-CM | POA: Diagnosis not present

## 2019-06-13 DIAGNOSIS — Z992 Dependence on renal dialysis: Secondary | ICD-10-CM | POA: Diagnosis not present

## 2019-06-13 DIAGNOSIS — K769 Liver disease, unspecified: Secondary | ICD-10-CM | POA: Diagnosis not present

## 2019-06-14 DIAGNOSIS — R17 Unspecified jaundice: Secondary | ICD-10-CM | POA: Diagnosis not present

## 2019-06-14 DIAGNOSIS — Z992 Dependence on renal dialysis: Secondary | ICD-10-CM | POA: Diagnosis not present

## 2019-06-14 DIAGNOSIS — N2581 Secondary hyperparathyroidism of renal origin: Secondary | ICD-10-CM | POA: Diagnosis not present

## 2019-06-14 DIAGNOSIS — K769 Liver disease, unspecified: Secondary | ICD-10-CM | POA: Diagnosis not present

## 2019-06-14 DIAGNOSIS — N186 End stage renal disease: Secondary | ICD-10-CM | POA: Diagnosis not present

## 2019-06-14 DIAGNOSIS — D631 Anemia in chronic kidney disease: Secondary | ICD-10-CM | POA: Diagnosis not present

## 2019-06-15 DIAGNOSIS — K769 Liver disease, unspecified: Secondary | ICD-10-CM | POA: Diagnosis not present

## 2019-06-15 DIAGNOSIS — N2581 Secondary hyperparathyroidism of renal origin: Secondary | ICD-10-CM | POA: Diagnosis not present

## 2019-06-15 DIAGNOSIS — N186 End stage renal disease: Secondary | ICD-10-CM | POA: Diagnosis not present

## 2019-06-15 DIAGNOSIS — Z992 Dependence on renal dialysis: Secondary | ICD-10-CM | POA: Diagnosis not present

## 2019-06-15 DIAGNOSIS — R17 Unspecified jaundice: Secondary | ICD-10-CM | POA: Diagnosis not present

## 2019-06-15 DIAGNOSIS — D631 Anemia in chronic kidney disease: Secondary | ICD-10-CM | POA: Diagnosis not present

## 2019-06-16 DIAGNOSIS — Z992 Dependence on renal dialysis: Secondary | ICD-10-CM | POA: Diagnosis not present

## 2019-06-16 DIAGNOSIS — K769 Liver disease, unspecified: Secondary | ICD-10-CM | POA: Diagnosis not present

## 2019-06-16 DIAGNOSIS — D631 Anemia in chronic kidney disease: Secondary | ICD-10-CM | POA: Diagnosis not present

## 2019-06-16 DIAGNOSIS — N2581 Secondary hyperparathyroidism of renal origin: Secondary | ICD-10-CM | POA: Diagnosis not present

## 2019-06-16 DIAGNOSIS — R17 Unspecified jaundice: Secondary | ICD-10-CM | POA: Diagnosis not present

## 2019-06-16 DIAGNOSIS — N186 End stage renal disease: Secondary | ICD-10-CM | POA: Diagnosis not present

## 2019-06-17 DIAGNOSIS — N2581 Secondary hyperparathyroidism of renal origin: Secondary | ICD-10-CM | POA: Diagnosis not present

## 2019-06-17 DIAGNOSIS — K769 Liver disease, unspecified: Secondary | ICD-10-CM | POA: Diagnosis not present

## 2019-06-17 DIAGNOSIS — D631 Anemia in chronic kidney disease: Secondary | ICD-10-CM | POA: Diagnosis not present

## 2019-06-17 DIAGNOSIS — N186 End stage renal disease: Secondary | ICD-10-CM | POA: Diagnosis not present

## 2019-06-17 DIAGNOSIS — R17 Unspecified jaundice: Secondary | ICD-10-CM | POA: Diagnosis not present

## 2019-06-17 DIAGNOSIS — Z992 Dependence on renal dialysis: Secondary | ICD-10-CM | POA: Diagnosis not present

## 2019-06-18 DIAGNOSIS — D631 Anemia in chronic kidney disease: Secondary | ICD-10-CM | POA: Diagnosis not present

## 2019-06-18 DIAGNOSIS — N186 End stage renal disease: Secondary | ICD-10-CM | POA: Diagnosis not present

## 2019-06-18 DIAGNOSIS — N2581 Secondary hyperparathyroidism of renal origin: Secondary | ICD-10-CM | POA: Diagnosis not present

## 2019-06-18 DIAGNOSIS — K769 Liver disease, unspecified: Secondary | ICD-10-CM | POA: Diagnosis not present

## 2019-06-18 DIAGNOSIS — Z992 Dependence on renal dialysis: Secondary | ICD-10-CM | POA: Diagnosis not present

## 2019-06-18 DIAGNOSIS — R17 Unspecified jaundice: Secondary | ICD-10-CM | POA: Diagnosis not present

## 2019-06-19 DIAGNOSIS — K769 Liver disease, unspecified: Secondary | ICD-10-CM | POA: Diagnosis not present

## 2019-06-19 DIAGNOSIS — N2581 Secondary hyperparathyroidism of renal origin: Secondary | ICD-10-CM | POA: Diagnosis not present

## 2019-06-19 DIAGNOSIS — N186 End stage renal disease: Secondary | ICD-10-CM | POA: Diagnosis not present

## 2019-06-19 DIAGNOSIS — Z992 Dependence on renal dialysis: Secondary | ICD-10-CM | POA: Diagnosis not present

## 2019-06-19 DIAGNOSIS — D631 Anemia in chronic kidney disease: Secondary | ICD-10-CM | POA: Diagnosis not present

## 2019-06-19 DIAGNOSIS — R17 Unspecified jaundice: Secondary | ICD-10-CM | POA: Diagnosis not present

## 2019-06-20 DIAGNOSIS — K769 Liver disease, unspecified: Secondary | ICD-10-CM | POA: Diagnosis not present

## 2019-06-20 DIAGNOSIS — Z992 Dependence on renal dialysis: Secondary | ICD-10-CM | POA: Diagnosis not present

## 2019-06-20 DIAGNOSIS — D631 Anemia in chronic kidney disease: Secondary | ICD-10-CM | POA: Diagnosis not present

## 2019-06-20 DIAGNOSIS — N2581 Secondary hyperparathyroidism of renal origin: Secondary | ICD-10-CM | POA: Diagnosis not present

## 2019-06-20 DIAGNOSIS — N186 End stage renal disease: Secondary | ICD-10-CM | POA: Diagnosis not present

## 2019-06-20 DIAGNOSIS — R17 Unspecified jaundice: Secondary | ICD-10-CM | POA: Diagnosis not present

## 2019-06-21 DIAGNOSIS — R17 Unspecified jaundice: Secondary | ICD-10-CM | POA: Diagnosis not present

## 2019-06-21 DIAGNOSIS — N186 End stage renal disease: Secondary | ICD-10-CM | POA: Diagnosis not present

## 2019-06-21 DIAGNOSIS — N2581 Secondary hyperparathyroidism of renal origin: Secondary | ICD-10-CM | POA: Diagnosis not present

## 2019-06-21 DIAGNOSIS — K769 Liver disease, unspecified: Secondary | ICD-10-CM | POA: Diagnosis not present

## 2019-06-21 DIAGNOSIS — Z992 Dependence on renal dialysis: Secondary | ICD-10-CM | POA: Diagnosis not present

## 2019-06-21 DIAGNOSIS — D631 Anemia in chronic kidney disease: Secondary | ICD-10-CM | POA: Diagnosis not present

## 2019-06-22 DIAGNOSIS — K769 Liver disease, unspecified: Secondary | ICD-10-CM | POA: Diagnosis not present

## 2019-06-22 DIAGNOSIS — Z992 Dependence on renal dialysis: Secondary | ICD-10-CM | POA: Diagnosis not present

## 2019-06-22 DIAGNOSIS — R17 Unspecified jaundice: Secondary | ICD-10-CM | POA: Diagnosis not present

## 2019-06-22 DIAGNOSIS — N186 End stage renal disease: Secondary | ICD-10-CM | POA: Diagnosis not present

## 2019-06-22 DIAGNOSIS — D631 Anemia in chronic kidney disease: Secondary | ICD-10-CM | POA: Diagnosis not present

## 2019-06-22 DIAGNOSIS — N2581 Secondary hyperparathyroidism of renal origin: Secondary | ICD-10-CM | POA: Diagnosis not present

## 2019-06-23 DIAGNOSIS — N186 End stage renal disease: Secondary | ICD-10-CM | POA: Diagnosis not present

## 2019-06-23 DIAGNOSIS — N2581 Secondary hyperparathyroidism of renal origin: Secondary | ICD-10-CM | POA: Diagnosis not present

## 2019-06-23 DIAGNOSIS — D631 Anemia in chronic kidney disease: Secondary | ICD-10-CM | POA: Diagnosis not present

## 2019-06-23 DIAGNOSIS — R17 Unspecified jaundice: Secondary | ICD-10-CM | POA: Diagnosis not present

## 2019-06-23 DIAGNOSIS — Z992 Dependence on renal dialysis: Secondary | ICD-10-CM | POA: Diagnosis not present

## 2019-06-23 DIAGNOSIS — K769 Liver disease, unspecified: Secondary | ICD-10-CM | POA: Diagnosis not present

## 2019-06-24 DIAGNOSIS — N186 End stage renal disease: Secondary | ICD-10-CM | POA: Diagnosis not present

## 2019-06-24 DIAGNOSIS — K769 Liver disease, unspecified: Secondary | ICD-10-CM | POA: Diagnosis not present

## 2019-06-24 DIAGNOSIS — R17 Unspecified jaundice: Secondary | ICD-10-CM | POA: Diagnosis not present

## 2019-06-24 DIAGNOSIS — D631 Anemia in chronic kidney disease: Secondary | ICD-10-CM | POA: Diagnosis not present

## 2019-06-24 DIAGNOSIS — N2581 Secondary hyperparathyroidism of renal origin: Secondary | ICD-10-CM | POA: Diagnosis not present

## 2019-06-24 DIAGNOSIS — Z992 Dependence on renal dialysis: Secondary | ICD-10-CM | POA: Diagnosis not present

## 2019-06-25 DIAGNOSIS — N2581 Secondary hyperparathyroidism of renal origin: Secondary | ICD-10-CM | POA: Diagnosis not present

## 2019-06-25 DIAGNOSIS — Z992 Dependence on renal dialysis: Secondary | ICD-10-CM | POA: Diagnosis not present

## 2019-06-25 DIAGNOSIS — D631 Anemia in chronic kidney disease: Secondary | ICD-10-CM | POA: Diagnosis not present

## 2019-06-25 DIAGNOSIS — R17 Unspecified jaundice: Secondary | ICD-10-CM | POA: Diagnosis not present

## 2019-06-25 DIAGNOSIS — N186 End stage renal disease: Secondary | ICD-10-CM | POA: Diagnosis not present

## 2019-06-25 DIAGNOSIS — K769 Liver disease, unspecified: Secondary | ICD-10-CM | POA: Diagnosis not present

## 2019-06-26 DIAGNOSIS — D631 Anemia in chronic kidney disease: Secondary | ICD-10-CM | POA: Diagnosis not present

## 2019-06-26 DIAGNOSIS — N2581 Secondary hyperparathyroidism of renal origin: Secondary | ICD-10-CM | POA: Diagnosis not present

## 2019-06-26 DIAGNOSIS — N186 End stage renal disease: Secondary | ICD-10-CM | POA: Diagnosis not present

## 2019-06-26 DIAGNOSIS — K769 Liver disease, unspecified: Secondary | ICD-10-CM | POA: Diagnosis not present

## 2019-06-26 DIAGNOSIS — R17 Unspecified jaundice: Secondary | ICD-10-CM | POA: Diagnosis not present

## 2019-06-26 DIAGNOSIS — Z992 Dependence on renal dialysis: Secondary | ICD-10-CM | POA: Diagnosis not present

## 2019-06-27 DIAGNOSIS — D631 Anemia in chronic kidney disease: Secondary | ICD-10-CM | POA: Diagnosis not present

## 2019-06-27 DIAGNOSIS — N2581 Secondary hyperparathyroidism of renal origin: Secondary | ICD-10-CM | POA: Diagnosis not present

## 2019-06-27 DIAGNOSIS — Z992 Dependence on renal dialysis: Secondary | ICD-10-CM | POA: Diagnosis not present

## 2019-06-27 DIAGNOSIS — N186 End stage renal disease: Secondary | ICD-10-CM | POA: Diagnosis not present

## 2019-06-27 DIAGNOSIS — R17 Unspecified jaundice: Secondary | ICD-10-CM | POA: Diagnosis not present

## 2019-06-27 DIAGNOSIS — K769 Liver disease, unspecified: Secondary | ICD-10-CM | POA: Diagnosis not present

## 2019-06-28 DIAGNOSIS — N2581 Secondary hyperparathyroidism of renal origin: Secondary | ICD-10-CM | POA: Diagnosis not present

## 2019-06-28 DIAGNOSIS — Z992 Dependence on renal dialysis: Secondary | ICD-10-CM | POA: Diagnosis not present

## 2019-06-28 DIAGNOSIS — K769 Liver disease, unspecified: Secondary | ICD-10-CM | POA: Diagnosis not present

## 2019-06-28 DIAGNOSIS — R17 Unspecified jaundice: Secondary | ICD-10-CM | POA: Diagnosis not present

## 2019-06-28 DIAGNOSIS — D631 Anemia in chronic kidney disease: Secondary | ICD-10-CM | POA: Diagnosis not present

## 2019-06-28 DIAGNOSIS — N186 End stage renal disease: Secondary | ICD-10-CM | POA: Diagnosis not present

## 2019-06-29 DIAGNOSIS — D631 Anemia in chronic kidney disease: Secondary | ICD-10-CM | POA: Diagnosis not present

## 2019-06-29 DIAGNOSIS — R17 Unspecified jaundice: Secondary | ICD-10-CM | POA: Diagnosis not present

## 2019-06-29 DIAGNOSIS — N2581 Secondary hyperparathyroidism of renal origin: Secondary | ICD-10-CM | POA: Diagnosis not present

## 2019-06-29 DIAGNOSIS — N186 End stage renal disease: Secondary | ICD-10-CM | POA: Diagnosis not present

## 2019-06-29 DIAGNOSIS — Z992 Dependence on renal dialysis: Secondary | ICD-10-CM | POA: Diagnosis not present

## 2019-06-29 DIAGNOSIS — K769 Liver disease, unspecified: Secondary | ICD-10-CM | POA: Diagnosis not present

## 2019-06-30 DIAGNOSIS — Z992 Dependence on renal dialysis: Secondary | ICD-10-CM | POA: Diagnosis not present

## 2019-06-30 DIAGNOSIS — N186 End stage renal disease: Secondary | ICD-10-CM | POA: Diagnosis not present

## 2019-06-30 DIAGNOSIS — R17 Unspecified jaundice: Secondary | ICD-10-CM | POA: Diagnosis not present

## 2019-06-30 DIAGNOSIS — K769 Liver disease, unspecified: Secondary | ICD-10-CM | POA: Diagnosis not present

## 2019-06-30 DIAGNOSIS — N2581 Secondary hyperparathyroidism of renal origin: Secondary | ICD-10-CM | POA: Diagnosis not present

## 2019-06-30 DIAGNOSIS — D631 Anemia in chronic kidney disease: Secondary | ICD-10-CM | POA: Diagnosis not present

## 2019-07-01 DIAGNOSIS — K769 Liver disease, unspecified: Secondary | ICD-10-CM | POA: Diagnosis not present

## 2019-07-01 DIAGNOSIS — D631 Anemia in chronic kidney disease: Secondary | ICD-10-CM | POA: Diagnosis not present

## 2019-07-01 DIAGNOSIS — Z992 Dependence on renal dialysis: Secondary | ICD-10-CM | POA: Diagnosis not present

## 2019-07-01 DIAGNOSIS — N2581 Secondary hyperparathyroidism of renal origin: Secondary | ICD-10-CM | POA: Diagnosis not present

## 2019-07-01 DIAGNOSIS — R17 Unspecified jaundice: Secondary | ICD-10-CM | POA: Diagnosis not present

## 2019-07-01 DIAGNOSIS — N186 End stage renal disease: Secondary | ICD-10-CM | POA: Diagnosis not present

## 2019-07-02 DIAGNOSIS — D631 Anemia in chronic kidney disease: Secondary | ICD-10-CM | POA: Diagnosis not present

## 2019-07-02 DIAGNOSIS — R17 Unspecified jaundice: Secondary | ICD-10-CM | POA: Diagnosis not present

## 2019-07-02 DIAGNOSIS — K769 Liver disease, unspecified: Secondary | ICD-10-CM | POA: Diagnosis not present

## 2019-07-02 DIAGNOSIS — N2581 Secondary hyperparathyroidism of renal origin: Secondary | ICD-10-CM | POA: Diagnosis not present

## 2019-07-02 DIAGNOSIS — N186 End stage renal disease: Secondary | ICD-10-CM | POA: Diagnosis not present

## 2019-07-02 DIAGNOSIS — Z992 Dependence on renal dialysis: Secondary | ICD-10-CM | POA: Diagnosis not present

## 2019-07-03 DIAGNOSIS — N2581 Secondary hyperparathyroidism of renal origin: Secondary | ICD-10-CM | POA: Diagnosis not present

## 2019-07-03 DIAGNOSIS — N186 End stage renal disease: Secondary | ICD-10-CM | POA: Diagnosis not present

## 2019-07-03 DIAGNOSIS — Z992 Dependence on renal dialysis: Secondary | ICD-10-CM | POA: Diagnosis not present

## 2019-07-03 DIAGNOSIS — K769 Liver disease, unspecified: Secondary | ICD-10-CM | POA: Diagnosis not present

## 2019-07-03 DIAGNOSIS — R17 Unspecified jaundice: Secondary | ICD-10-CM | POA: Diagnosis not present

## 2019-07-03 DIAGNOSIS — D631 Anemia in chronic kidney disease: Secondary | ICD-10-CM | POA: Diagnosis not present

## 2019-07-04 DIAGNOSIS — R17 Unspecified jaundice: Secondary | ICD-10-CM | POA: Diagnosis not present

## 2019-07-04 DIAGNOSIS — D631 Anemia in chronic kidney disease: Secondary | ICD-10-CM | POA: Diagnosis not present

## 2019-07-04 DIAGNOSIS — Z992 Dependence on renal dialysis: Secondary | ICD-10-CM | POA: Diagnosis not present

## 2019-07-04 DIAGNOSIS — N2581 Secondary hyperparathyroidism of renal origin: Secondary | ICD-10-CM | POA: Diagnosis not present

## 2019-07-04 DIAGNOSIS — K769 Liver disease, unspecified: Secondary | ICD-10-CM | POA: Diagnosis not present

## 2019-07-04 DIAGNOSIS — N186 End stage renal disease: Secondary | ICD-10-CM | POA: Diagnosis not present

## 2019-07-05 DIAGNOSIS — R82998 Other abnormal findings in urine: Secondary | ICD-10-CM | POA: Diagnosis not present

## 2019-08-05 DIAGNOSIS — N2581 Secondary hyperparathyroidism of renal origin: Secondary | ICD-10-CM | POA: Diagnosis not present

## 2019-08-05 DIAGNOSIS — N2589 Other disorders resulting from impaired renal tubular function: Secondary | ICD-10-CM | POA: Diagnosis not present

## 2019-08-05 DIAGNOSIS — I129 Hypertensive chronic kidney disease with stage 1 through stage 4 chronic kidney disease, or unspecified chronic kidney disease: Secondary | ICD-10-CM | POA: Diagnosis not present

## 2019-08-05 DIAGNOSIS — N186 End stage renal disease: Secondary | ICD-10-CM | POA: Diagnosis not present

## 2019-08-05 DIAGNOSIS — E876 Hypokalemia: Secondary | ICD-10-CM | POA: Diagnosis not present

## 2019-08-05 DIAGNOSIS — Z4932 Encounter for adequacy testing for peritoneal dialysis: Secondary | ICD-10-CM | POA: Diagnosis not present

## 2019-08-05 DIAGNOSIS — D509 Iron deficiency anemia, unspecified: Secondary | ICD-10-CM | POA: Diagnosis not present

## 2019-08-05 DIAGNOSIS — K769 Liver disease, unspecified: Secondary | ICD-10-CM | POA: Diagnosis not present

## 2019-08-05 DIAGNOSIS — Z992 Dependence on renal dialysis: Secondary | ICD-10-CM | POA: Diagnosis not present

## 2019-08-05 DIAGNOSIS — D631 Anemia in chronic kidney disease: Secondary | ICD-10-CM | POA: Diagnosis not present

## 2019-08-06 DIAGNOSIS — N186 End stage renal disease: Secondary | ICD-10-CM | POA: Diagnosis not present

## 2019-08-06 DIAGNOSIS — Z992 Dependence on renal dialysis: Secondary | ICD-10-CM | POA: Diagnosis not present

## 2019-08-06 DIAGNOSIS — E876 Hypokalemia: Secondary | ICD-10-CM | POA: Diagnosis not present

## 2019-08-06 DIAGNOSIS — D631 Anemia in chronic kidney disease: Secondary | ICD-10-CM | POA: Diagnosis not present

## 2019-08-06 DIAGNOSIS — N2589 Other disorders resulting from impaired renal tubular function: Secondary | ICD-10-CM | POA: Diagnosis not present

## 2019-08-06 DIAGNOSIS — D509 Iron deficiency anemia, unspecified: Secondary | ICD-10-CM | POA: Diagnosis not present

## 2019-08-07 DIAGNOSIS — N2589 Other disorders resulting from impaired renal tubular function: Secondary | ICD-10-CM | POA: Diagnosis not present

## 2019-08-07 DIAGNOSIS — Z992 Dependence on renal dialysis: Secondary | ICD-10-CM | POA: Diagnosis not present

## 2019-08-07 DIAGNOSIS — D509 Iron deficiency anemia, unspecified: Secondary | ICD-10-CM | POA: Diagnosis not present

## 2019-08-07 DIAGNOSIS — N186 End stage renal disease: Secondary | ICD-10-CM | POA: Diagnosis not present

## 2019-08-07 DIAGNOSIS — D631 Anemia in chronic kidney disease: Secondary | ICD-10-CM | POA: Diagnosis not present

## 2019-08-07 DIAGNOSIS — E876 Hypokalemia: Secondary | ICD-10-CM | POA: Diagnosis not present

## 2019-08-08 DIAGNOSIS — Z992 Dependence on renal dialysis: Secondary | ICD-10-CM | POA: Diagnosis not present

## 2019-08-08 DIAGNOSIS — D631 Anemia in chronic kidney disease: Secondary | ICD-10-CM | POA: Diagnosis not present

## 2019-08-08 DIAGNOSIS — N2589 Other disorders resulting from impaired renal tubular function: Secondary | ICD-10-CM | POA: Diagnosis not present

## 2019-08-08 DIAGNOSIS — D509 Iron deficiency anemia, unspecified: Secondary | ICD-10-CM | POA: Diagnosis not present

## 2019-08-08 DIAGNOSIS — N186 End stage renal disease: Secondary | ICD-10-CM | POA: Diagnosis not present

## 2019-08-08 DIAGNOSIS — E876 Hypokalemia: Secondary | ICD-10-CM | POA: Diagnosis not present

## 2019-08-09 DIAGNOSIS — E876 Hypokalemia: Secondary | ICD-10-CM | POA: Diagnosis not present

## 2019-08-09 DIAGNOSIS — N186 End stage renal disease: Secondary | ICD-10-CM | POA: Diagnosis not present

## 2019-08-09 DIAGNOSIS — Z992 Dependence on renal dialysis: Secondary | ICD-10-CM | POA: Diagnosis not present

## 2019-08-09 DIAGNOSIS — D509 Iron deficiency anemia, unspecified: Secondary | ICD-10-CM | POA: Diagnosis not present

## 2019-08-09 DIAGNOSIS — N2589 Other disorders resulting from impaired renal tubular function: Secondary | ICD-10-CM | POA: Diagnosis not present

## 2019-08-09 DIAGNOSIS — D631 Anemia in chronic kidney disease: Secondary | ICD-10-CM | POA: Diagnosis not present

## 2019-08-10 DIAGNOSIS — R82998 Other abnormal findings in urine: Secondary | ICD-10-CM | POA: Diagnosis not present

## 2019-08-10 DIAGNOSIS — N2589 Other disorders resulting from impaired renal tubular function: Secondary | ICD-10-CM | POA: Diagnosis not present

## 2019-08-10 DIAGNOSIS — Z4932 Encounter for adequacy testing for peritoneal dialysis: Secondary | ICD-10-CM | POA: Diagnosis not present

## 2019-08-10 DIAGNOSIS — E7849 Other hyperlipidemia: Secondary | ICD-10-CM | POA: Diagnosis not present

## 2019-08-10 DIAGNOSIS — D509 Iron deficiency anemia, unspecified: Secondary | ICD-10-CM | POA: Diagnosis not present

## 2019-08-10 DIAGNOSIS — Z992 Dependence on renal dialysis: Secondary | ICD-10-CM | POA: Diagnosis not present

## 2019-08-10 DIAGNOSIS — D631 Anemia in chronic kidney disease: Secondary | ICD-10-CM | POA: Diagnosis not present

## 2019-08-10 DIAGNOSIS — E876 Hypokalemia: Secondary | ICD-10-CM | POA: Diagnosis not present

## 2019-08-10 DIAGNOSIS — N186 End stage renal disease: Secondary | ICD-10-CM | POA: Diagnosis not present

## 2019-08-10 DIAGNOSIS — E1129 Type 2 diabetes mellitus with other diabetic kidney complication: Secondary | ICD-10-CM | POA: Diagnosis not present

## 2019-08-11 DIAGNOSIS — Z992 Dependence on renal dialysis: Secondary | ICD-10-CM | POA: Diagnosis not present

## 2019-08-11 DIAGNOSIS — N2589 Other disorders resulting from impaired renal tubular function: Secondary | ICD-10-CM | POA: Diagnosis not present

## 2019-08-11 DIAGNOSIS — E876 Hypokalemia: Secondary | ICD-10-CM | POA: Diagnosis not present

## 2019-08-11 DIAGNOSIS — D631 Anemia in chronic kidney disease: Secondary | ICD-10-CM | POA: Diagnosis not present

## 2019-08-11 DIAGNOSIS — D509 Iron deficiency anemia, unspecified: Secondary | ICD-10-CM | POA: Diagnosis not present

## 2019-08-11 DIAGNOSIS — N186 End stage renal disease: Secondary | ICD-10-CM | POA: Diagnosis not present

## 2019-08-12 DIAGNOSIS — Z992 Dependence on renal dialysis: Secondary | ICD-10-CM | POA: Diagnosis not present

## 2019-08-12 DIAGNOSIS — D509 Iron deficiency anemia, unspecified: Secondary | ICD-10-CM | POA: Diagnosis not present

## 2019-08-12 DIAGNOSIS — N2589 Other disorders resulting from impaired renal tubular function: Secondary | ICD-10-CM | POA: Diagnosis not present

## 2019-08-12 DIAGNOSIS — E876 Hypokalemia: Secondary | ICD-10-CM | POA: Diagnosis not present

## 2019-08-12 DIAGNOSIS — D631 Anemia in chronic kidney disease: Secondary | ICD-10-CM | POA: Diagnosis not present

## 2019-08-12 DIAGNOSIS — N186 End stage renal disease: Secondary | ICD-10-CM | POA: Diagnosis not present

## 2019-08-13 DIAGNOSIS — D509 Iron deficiency anemia, unspecified: Secondary | ICD-10-CM | POA: Diagnosis not present

## 2019-08-13 DIAGNOSIS — N2589 Other disorders resulting from impaired renal tubular function: Secondary | ICD-10-CM | POA: Diagnosis not present

## 2019-08-13 DIAGNOSIS — N186 End stage renal disease: Secondary | ICD-10-CM | POA: Diagnosis not present

## 2019-08-13 DIAGNOSIS — Z992 Dependence on renal dialysis: Secondary | ICD-10-CM | POA: Diagnosis not present

## 2019-08-13 DIAGNOSIS — E876 Hypokalemia: Secondary | ICD-10-CM | POA: Diagnosis not present

## 2019-08-13 DIAGNOSIS — D631 Anemia in chronic kidney disease: Secondary | ICD-10-CM | POA: Diagnosis not present

## 2019-08-14 DIAGNOSIS — N186 End stage renal disease: Secondary | ICD-10-CM | POA: Diagnosis not present

## 2019-08-14 DIAGNOSIS — E876 Hypokalemia: Secondary | ICD-10-CM | POA: Diagnosis not present

## 2019-08-14 DIAGNOSIS — N2589 Other disorders resulting from impaired renal tubular function: Secondary | ICD-10-CM | POA: Diagnosis not present

## 2019-08-14 DIAGNOSIS — Z992 Dependence on renal dialysis: Secondary | ICD-10-CM | POA: Diagnosis not present

## 2019-08-14 DIAGNOSIS — D631 Anemia in chronic kidney disease: Secondary | ICD-10-CM | POA: Diagnosis not present

## 2019-08-14 DIAGNOSIS — D509 Iron deficiency anemia, unspecified: Secondary | ICD-10-CM | POA: Diagnosis not present

## 2019-08-15 DIAGNOSIS — N2589 Other disorders resulting from impaired renal tubular function: Secondary | ICD-10-CM | POA: Diagnosis not present

## 2019-08-15 DIAGNOSIS — Z992 Dependence on renal dialysis: Secondary | ICD-10-CM | POA: Diagnosis not present

## 2019-08-15 DIAGNOSIS — D631 Anemia in chronic kidney disease: Secondary | ICD-10-CM | POA: Diagnosis not present

## 2019-08-15 DIAGNOSIS — D509 Iron deficiency anemia, unspecified: Secondary | ICD-10-CM | POA: Diagnosis not present

## 2019-08-15 DIAGNOSIS — E876 Hypokalemia: Secondary | ICD-10-CM | POA: Diagnosis not present

## 2019-08-15 DIAGNOSIS — N186 End stage renal disease: Secondary | ICD-10-CM | POA: Diagnosis not present

## 2019-08-16 DIAGNOSIS — N186 End stage renal disease: Secondary | ICD-10-CM | POA: Diagnosis not present

## 2019-08-16 DIAGNOSIS — N2589 Other disorders resulting from impaired renal tubular function: Secondary | ICD-10-CM | POA: Diagnosis not present

## 2019-08-16 DIAGNOSIS — Z992 Dependence on renal dialysis: Secondary | ICD-10-CM | POA: Diagnosis not present

## 2019-08-16 DIAGNOSIS — D631 Anemia in chronic kidney disease: Secondary | ICD-10-CM | POA: Diagnosis not present

## 2019-08-16 DIAGNOSIS — D509 Iron deficiency anemia, unspecified: Secondary | ICD-10-CM | POA: Diagnosis not present

## 2019-08-16 DIAGNOSIS — E876 Hypokalemia: Secondary | ICD-10-CM | POA: Diagnosis not present

## 2019-08-17 DIAGNOSIS — Z992 Dependence on renal dialysis: Secondary | ICD-10-CM | POA: Diagnosis not present

## 2019-08-17 DIAGNOSIS — N186 End stage renal disease: Secondary | ICD-10-CM | POA: Diagnosis not present

## 2019-08-17 DIAGNOSIS — D631 Anemia in chronic kidney disease: Secondary | ICD-10-CM | POA: Diagnosis not present

## 2019-08-17 DIAGNOSIS — N2589 Other disorders resulting from impaired renal tubular function: Secondary | ICD-10-CM | POA: Diagnosis not present

## 2019-08-17 DIAGNOSIS — E876 Hypokalemia: Secondary | ICD-10-CM | POA: Diagnosis not present

## 2019-08-17 DIAGNOSIS — D509 Iron deficiency anemia, unspecified: Secondary | ICD-10-CM | POA: Diagnosis not present

## 2019-08-18 DIAGNOSIS — D509 Iron deficiency anemia, unspecified: Secondary | ICD-10-CM | POA: Diagnosis not present

## 2019-08-18 DIAGNOSIS — Z992 Dependence on renal dialysis: Secondary | ICD-10-CM | POA: Diagnosis not present

## 2019-08-18 DIAGNOSIS — N2589 Other disorders resulting from impaired renal tubular function: Secondary | ICD-10-CM | POA: Diagnosis not present

## 2019-08-18 DIAGNOSIS — D631 Anemia in chronic kidney disease: Secondary | ICD-10-CM | POA: Diagnosis not present

## 2019-08-18 DIAGNOSIS — E876 Hypokalemia: Secondary | ICD-10-CM | POA: Diagnosis not present

## 2019-08-18 DIAGNOSIS — N186 End stage renal disease: Secondary | ICD-10-CM | POA: Diagnosis not present

## 2019-08-19 DIAGNOSIS — E876 Hypokalemia: Secondary | ICD-10-CM | POA: Diagnosis not present

## 2019-08-19 DIAGNOSIS — N186 End stage renal disease: Secondary | ICD-10-CM | POA: Diagnosis not present

## 2019-08-19 DIAGNOSIS — D509 Iron deficiency anemia, unspecified: Secondary | ICD-10-CM | POA: Diagnosis not present

## 2019-08-19 DIAGNOSIS — Z992 Dependence on renal dialysis: Secondary | ICD-10-CM | POA: Diagnosis not present

## 2019-08-19 DIAGNOSIS — N2589 Other disorders resulting from impaired renal tubular function: Secondary | ICD-10-CM | POA: Diagnosis not present

## 2019-08-19 DIAGNOSIS — D631 Anemia in chronic kidney disease: Secondary | ICD-10-CM | POA: Diagnosis not present

## 2019-08-20 DIAGNOSIS — D631 Anemia in chronic kidney disease: Secondary | ICD-10-CM | POA: Diagnosis not present

## 2019-08-20 DIAGNOSIS — N186 End stage renal disease: Secondary | ICD-10-CM | POA: Diagnosis not present

## 2019-08-20 DIAGNOSIS — D509 Iron deficiency anemia, unspecified: Secondary | ICD-10-CM | POA: Diagnosis not present

## 2019-08-20 DIAGNOSIS — N2589 Other disorders resulting from impaired renal tubular function: Secondary | ICD-10-CM | POA: Diagnosis not present

## 2019-08-20 DIAGNOSIS — Z992 Dependence on renal dialysis: Secondary | ICD-10-CM | POA: Diagnosis not present

## 2019-08-20 DIAGNOSIS — E876 Hypokalemia: Secondary | ICD-10-CM | POA: Diagnosis not present

## 2019-08-21 DIAGNOSIS — D631 Anemia in chronic kidney disease: Secondary | ICD-10-CM | POA: Diagnosis not present

## 2019-08-21 DIAGNOSIS — N2589 Other disorders resulting from impaired renal tubular function: Secondary | ICD-10-CM | POA: Diagnosis not present

## 2019-08-21 DIAGNOSIS — E876 Hypokalemia: Secondary | ICD-10-CM | POA: Diagnosis not present

## 2019-08-21 DIAGNOSIS — Z992 Dependence on renal dialysis: Secondary | ICD-10-CM | POA: Diagnosis not present

## 2019-08-21 DIAGNOSIS — N186 End stage renal disease: Secondary | ICD-10-CM | POA: Diagnosis not present

## 2019-08-21 DIAGNOSIS — D509 Iron deficiency anemia, unspecified: Secondary | ICD-10-CM | POA: Diagnosis not present

## 2019-08-22 DIAGNOSIS — D631 Anemia in chronic kidney disease: Secondary | ICD-10-CM | POA: Diagnosis not present

## 2019-08-22 DIAGNOSIS — Z992 Dependence on renal dialysis: Secondary | ICD-10-CM | POA: Diagnosis not present

## 2019-08-22 DIAGNOSIS — D509 Iron deficiency anemia, unspecified: Secondary | ICD-10-CM | POA: Diagnosis not present

## 2019-08-22 DIAGNOSIS — E876 Hypokalemia: Secondary | ICD-10-CM | POA: Diagnosis not present

## 2019-08-22 DIAGNOSIS — N186 End stage renal disease: Secondary | ICD-10-CM | POA: Diagnosis not present

## 2019-08-22 DIAGNOSIS — N2589 Other disorders resulting from impaired renal tubular function: Secondary | ICD-10-CM | POA: Diagnosis not present

## 2019-08-23 DIAGNOSIS — Z992 Dependence on renal dialysis: Secondary | ICD-10-CM | POA: Diagnosis not present

## 2019-08-23 DIAGNOSIS — E876 Hypokalemia: Secondary | ICD-10-CM | POA: Diagnosis not present

## 2019-08-23 DIAGNOSIS — N186 End stage renal disease: Secondary | ICD-10-CM | POA: Diagnosis not present

## 2019-08-23 DIAGNOSIS — D509 Iron deficiency anemia, unspecified: Secondary | ICD-10-CM | POA: Diagnosis not present

## 2019-08-23 DIAGNOSIS — D631 Anemia in chronic kidney disease: Secondary | ICD-10-CM | POA: Diagnosis not present

## 2019-08-23 DIAGNOSIS — N2589 Other disorders resulting from impaired renal tubular function: Secondary | ICD-10-CM | POA: Diagnosis not present

## 2019-08-24 DIAGNOSIS — N2589 Other disorders resulting from impaired renal tubular function: Secondary | ICD-10-CM | POA: Diagnosis not present

## 2019-08-24 DIAGNOSIS — E876 Hypokalemia: Secondary | ICD-10-CM | POA: Diagnosis not present

## 2019-08-24 DIAGNOSIS — Z992 Dependence on renal dialysis: Secondary | ICD-10-CM | POA: Diagnosis not present

## 2019-08-24 DIAGNOSIS — D631 Anemia in chronic kidney disease: Secondary | ICD-10-CM | POA: Diagnosis not present

## 2019-08-24 DIAGNOSIS — N186 End stage renal disease: Secondary | ICD-10-CM | POA: Diagnosis not present

## 2019-08-24 DIAGNOSIS — D509 Iron deficiency anemia, unspecified: Secondary | ICD-10-CM | POA: Diagnosis not present

## 2019-08-25 DIAGNOSIS — Z992 Dependence on renal dialysis: Secondary | ICD-10-CM | POA: Diagnosis not present

## 2019-08-25 DIAGNOSIS — N186 End stage renal disease: Secondary | ICD-10-CM | POA: Diagnosis not present

## 2019-08-25 DIAGNOSIS — E876 Hypokalemia: Secondary | ICD-10-CM | POA: Diagnosis not present

## 2019-08-25 DIAGNOSIS — N2589 Other disorders resulting from impaired renal tubular function: Secondary | ICD-10-CM | POA: Diagnosis not present

## 2019-08-25 DIAGNOSIS — D631 Anemia in chronic kidney disease: Secondary | ICD-10-CM | POA: Diagnosis not present

## 2019-08-25 DIAGNOSIS — D509 Iron deficiency anemia, unspecified: Secondary | ICD-10-CM | POA: Diagnosis not present

## 2019-08-26 DIAGNOSIS — Z992 Dependence on renal dialysis: Secondary | ICD-10-CM | POA: Diagnosis not present

## 2019-08-26 DIAGNOSIS — N186 End stage renal disease: Secondary | ICD-10-CM | POA: Diagnosis not present

## 2019-08-26 DIAGNOSIS — E876 Hypokalemia: Secondary | ICD-10-CM | POA: Diagnosis not present

## 2019-08-26 DIAGNOSIS — N2589 Other disorders resulting from impaired renal tubular function: Secondary | ICD-10-CM | POA: Diagnosis not present

## 2019-08-26 DIAGNOSIS — D631 Anemia in chronic kidney disease: Secondary | ICD-10-CM | POA: Diagnosis not present

## 2019-08-26 DIAGNOSIS — D509 Iron deficiency anemia, unspecified: Secondary | ICD-10-CM | POA: Diagnosis not present

## 2019-08-27 DIAGNOSIS — E876 Hypokalemia: Secondary | ICD-10-CM | POA: Diagnosis not present

## 2019-08-27 DIAGNOSIS — N2589 Other disorders resulting from impaired renal tubular function: Secondary | ICD-10-CM | POA: Diagnosis not present

## 2019-08-27 DIAGNOSIS — Z992 Dependence on renal dialysis: Secondary | ICD-10-CM | POA: Diagnosis not present

## 2019-08-27 DIAGNOSIS — D509 Iron deficiency anemia, unspecified: Secondary | ICD-10-CM | POA: Diagnosis not present

## 2019-08-27 DIAGNOSIS — D631 Anemia in chronic kidney disease: Secondary | ICD-10-CM | POA: Diagnosis not present

## 2019-08-27 DIAGNOSIS — N186 End stage renal disease: Secondary | ICD-10-CM | POA: Diagnosis not present

## 2019-08-28 DIAGNOSIS — E876 Hypokalemia: Secondary | ICD-10-CM | POA: Diagnosis not present

## 2019-08-28 DIAGNOSIS — N186 End stage renal disease: Secondary | ICD-10-CM | POA: Diagnosis not present

## 2019-08-28 DIAGNOSIS — N2589 Other disorders resulting from impaired renal tubular function: Secondary | ICD-10-CM | POA: Diagnosis not present

## 2019-08-28 DIAGNOSIS — D631 Anemia in chronic kidney disease: Secondary | ICD-10-CM | POA: Diagnosis not present

## 2019-08-28 DIAGNOSIS — D509 Iron deficiency anemia, unspecified: Secondary | ICD-10-CM | POA: Diagnosis not present

## 2019-08-28 DIAGNOSIS — Z992 Dependence on renal dialysis: Secondary | ICD-10-CM | POA: Diagnosis not present

## 2019-08-29 DIAGNOSIS — N2589 Other disorders resulting from impaired renal tubular function: Secondary | ICD-10-CM | POA: Diagnosis not present

## 2019-08-29 DIAGNOSIS — D631 Anemia in chronic kidney disease: Secondary | ICD-10-CM | POA: Diagnosis not present

## 2019-08-29 DIAGNOSIS — Z992 Dependence on renal dialysis: Secondary | ICD-10-CM | POA: Diagnosis not present

## 2019-08-29 DIAGNOSIS — N186 End stage renal disease: Secondary | ICD-10-CM | POA: Diagnosis not present

## 2019-08-29 DIAGNOSIS — D509 Iron deficiency anemia, unspecified: Secondary | ICD-10-CM | POA: Diagnosis not present

## 2019-08-29 DIAGNOSIS — E876 Hypokalemia: Secondary | ICD-10-CM | POA: Diagnosis not present

## 2019-08-30 DIAGNOSIS — D509 Iron deficiency anemia, unspecified: Secondary | ICD-10-CM | POA: Diagnosis not present

## 2019-08-30 DIAGNOSIS — N2589 Other disorders resulting from impaired renal tubular function: Secondary | ICD-10-CM | POA: Diagnosis not present

## 2019-08-30 DIAGNOSIS — Z992 Dependence on renal dialysis: Secondary | ICD-10-CM | POA: Diagnosis not present

## 2019-08-30 DIAGNOSIS — N186 End stage renal disease: Secondary | ICD-10-CM | POA: Diagnosis not present

## 2019-08-30 DIAGNOSIS — E876 Hypokalemia: Secondary | ICD-10-CM | POA: Diagnosis not present

## 2019-08-30 DIAGNOSIS — D631 Anemia in chronic kidney disease: Secondary | ICD-10-CM | POA: Diagnosis not present

## 2019-08-31 DIAGNOSIS — E876 Hypokalemia: Secondary | ICD-10-CM | POA: Diagnosis not present

## 2019-08-31 DIAGNOSIS — D509 Iron deficiency anemia, unspecified: Secondary | ICD-10-CM | POA: Diagnosis not present

## 2019-08-31 DIAGNOSIS — D631 Anemia in chronic kidney disease: Secondary | ICD-10-CM | POA: Diagnosis not present

## 2019-08-31 DIAGNOSIS — Z992 Dependence on renal dialysis: Secondary | ICD-10-CM | POA: Diagnosis not present

## 2019-08-31 DIAGNOSIS — N186 End stage renal disease: Secondary | ICD-10-CM | POA: Diagnosis not present

## 2019-08-31 DIAGNOSIS — N2589 Other disorders resulting from impaired renal tubular function: Secondary | ICD-10-CM | POA: Diagnosis not present

## 2019-09-01 DIAGNOSIS — N186 End stage renal disease: Secondary | ICD-10-CM | POA: Diagnosis not present

## 2019-09-01 DIAGNOSIS — D509 Iron deficiency anemia, unspecified: Secondary | ICD-10-CM | POA: Diagnosis not present

## 2019-09-01 DIAGNOSIS — N2589 Other disorders resulting from impaired renal tubular function: Secondary | ICD-10-CM | POA: Diagnosis not present

## 2019-09-01 DIAGNOSIS — D631 Anemia in chronic kidney disease: Secondary | ICD-10-CM | POA: Diagnosis not present

## 2019-09-01 DIAGNOSIS — Z992 Dependence on renal dialysis: Secondary | ICD-10-CM | POA: Diagnosis not present

## 2019-09-01 DIAGNOSIS — E876 Hypokalemia: Secondary | ICD-10-CM | POA: Diagnosis not present

## 2019-09-02 DIAGNOSIS — D509 Iron deficiency anemia, unspecified: Secondary | ICD-10-CM | POA: Diagnosis not present

## 2019-09-02 DIAGNOSIS — Z992 Dependence on renal dialysis: Secondary | ICD-10-CM | POA: Diagnosis not present

## 2019-09-02 DIAGNOSIS — N186 End stage renal disease: Secondary | ICD-10-CM | POA: Diagnosis not present

## 2019-09-02 DIAGNOSIS — D631 Anemia in chronic kidney disease: Secondary | ICD-10-CM | POA: Diagnosis not present

## 2019-09-02 DIAGNOSIS — E876 Hypokalemia: Secondary | ICD-10-CM | POA: Diagnosis not present

## 2019-09-02 DIAGNOSIS — N2589 Other disorders resulting from impaired renal tubular function: Secondary | ICD-10-CM | POA: Diagnosis not present

## 2019-09-03 DIAGNOSIS — N2589 Other disorders resulting from impaired renal tubular function: Secondary | ICD-10-CM | POA: Diagnosis not present

## 2019-09-03 DIAGNOSIS — D509 Iron deficiency anemia, unspecified: Secondary | ICD-10-CM | POA: Diagnosis not present

## 2019-09-03 DIAGNOSIS — E876 Hypokalemia: Secondary | ICD-10-CM | POA: Diagnosis not present

## 2019-09-03 DIAGNOSIS — N186 End stage renal disease: Secondary | ICD-10-CM | POA: Diagnosis not present

## 2019-09-03 DIAGNOSIS — D631 Anemia in chronic kidney disease: Secondary | ICD-10-CM | POA: Diagnosis not present

## 2019-09-03 DIAGNOSIS — Z992 Dependence on renal dialysis: Secondary | ICD-10-CM | POA: Diagnosis not present

## 2019-09-04 DIAGNOSIS — N2589 Other disorders resulting from impaired renal tubular function: Secondary | ICD-10-CM | POA: Diagnosis not present

## 2019-09-04 DIAGNOSIS — E876 Hypokalemia: Secondary | ICD-10-CM | POA: Diagnosis not present

## 2019-09-04 DIAGNOSIS — Z992 Dependence on renal dialysis: Secondary | ICD-10-CM | POA: Diagnosis not present

## 2019-09-04 DIAGNOSIS — N186 End stage renal disease: Secondary | ICD-10-CM | POA: Diagnosis not present

## 2019-09-04 DIAGNOSIS — D631 Anemia in chronic kidney disease: Secondary | ICD-10-CM | POA: Diagnosis not present

## 2019-09-04 DIAGNOSIS — D509 Iron deficiency anemia, unspecified: Secondary | ICD-10-CM | POA: Diagnosis not present

## 2019-09-05 DIAGNOSIS — E876 Hypokalemia: Secondary | ICD-10-CM | POA: Diagnosis not present

## 2019-09-05 DIAGNOSIS — E440 Moderate protein-calorie malnutrition: Secondary | ICD-10-CM | POA: Diagnosis not present

## 2019-09-05 DIAGNOSIS — K769 Liver disease, unspecified: Secondary | ICD-10-CM | POA: Diagnosis not present

## 2019-09-05 DIAGNOSIS — Z4932 Encounter for adequacy testing for peritoneal dialysis: Secondary | ICD-10-CM | POA: Diagnosis not present

## 2019-09-05 DIAGNOSIS — I129 Hypertensive chronic kidney disease with stage 1 through stage 4 chronic kidney disease, or unspecified chronic kidney disease: Secondary | ICD-10-CM | POA: Diagnosis not present

## 2019-09-05 DIAGNOSIS — R17 Unspecified jaundice: Secondary | ICD-10-CM | POA: Diagnosis not present

## 2019-09-05 DIAGNOSIS — Z992 Dependence on renal dialysis: Secondary | ICD-10-CM | POA: Diagnosis not present

## 2019-09-05 DIAGNOSIS — N186 End stage renal disease: Secondary | ICD-10-CM | POA: Diagnosis not present

## 2019-09-05 DIAGNOSIS — D509 Iron deficiency anemia, unspecified: Secondary | ICD-10-CM | POA: Diagnosis not present

## 2019-09-05 DIAGNOSIS — N2581 Secondary hyperparathyroidism of renal origin: Secondary | ICD-10-CM | POA: Diagnosis not present

## 2019-09-05 DIAGNOSIS — D631 Anemia in chronic kidney disease: Secondary | ICD-10-CM | POA: Diagnosis not present

## 2019-09-05 DIAGNOSIS — Z79899 Other long term (current) drug therapy: Secondary | ICD-10-CM | POA: Diagnosis not present

## 2019-09-06 DIAGNOSIS — N186 End stage renal disease: Secondary | ICD-10-CM | POA: Diagnosis not present

## 2019-09-06 DIAGNOSIS — Z992 Dependence on renal dialysis: Secondary | ICD-10-CM | POA: Diagnosis not present

## 2019-09-06 DIAGNOSIS — R82998 Other abnormal findings in urine: Secondary | ICD-10-CM | POA: Diagnosis not present

## 2019-09-06 DIAGNOSIS — K769 Liver disease, unspecified: Secondary | ICD-10-CM | POA: Diagnosis not present

## 2019-09-06 DIAGNOSIS — Z4932 Encounter for adequacy testing for peritoneal dialysis: Secondary | ICD-10-CM | POA: Diagnosis not present

## 2019-09-06 DIAGNOSIS — E876 Hypokalemia: Secondary | ICD-10-CM | POA: Diagnosis not present

## 2019-09-07 DIAGNOSIS — E876 Hypokalemia: Secondary | ICD-10-CM | POA: Diagnosis not present

## 2019-09-07 DIAGNOSIS — N186 End stage renal disease: Secondary | ICD-10-CM | POA: Diagnosis not present

## 2019-09-07 DIAGNOSIS — Z992 Dependence on renal dialysis: Secondary | ICD-10-CM | POA: Diagnosis not present

## 2019-09-07 DIAGNOSIS — Z4932 Encounter for adequacy testing for peritoneal dialysis: Secondary | ICD-10-CM | POA: Diagnosis not present

## 2019-09-07 DIAGNOSIS — K769 Liver disease, unspecified: Secondary | ICD-10-CM | POA: Diagnosis not present

## 2019-09-08 DIAGNOSIS — Z4932 Encounter for adequacy testing for peritoneal dialysis: Secondary | ICD-10-CM | POA: Diagnosis not present

## 2019-09-08 DIAGNOSIS — Z992 Dependence on renal dialysis: Secondary | ICD-10-CM | POA: Diagnosis not present

## 2019-09-08 DIAGNOSIS — K769 Liver disease, unspecified: Secondary | ICD-10-CM | POA: Diagnosis not present

## 2019-09-08 DIAGNOSIS — E876 Hypokalemia: Secondary | ICD-10-CM | POA: Diagnosis not present

## 2019-09-08 DIAGNOSIS — N186 End stage renal disease: Secondary | ICD-10-CM | POA: Diagnosis not present

## 2019-09-09 DIAGNOSIS — N186 End stage renal disease: Secondary | ICD-10-CM | POA: Diagnosis not present

## 2019-09-09 DIAGNOSIS — Z4932 Encounter for adequacy testing for peritoneal dialysis: Secondary | ICD-10-CM | POA: Diagnosis not present

## 2019-09-09 DIAGNOSIS — Z992 Dependence on renal dialysis: Secondary | ICD-10-CM | POA: Diagnosis not present

## 2019-09-09 DIAGNOSIS — E876 Hypokalemia: Secondary | ICD-10-CM | POA: Diagnosis not present

## 2019-09-09 DIAGNOSIS — K769 Liver disease, unspecified: Secondary | ICD-10-CM | POA: Diagnosis not present

## 2019-09-10 DIAGNOSIS — Z4932 Encounter for adequacy testing for peritoneal dialysis: Secondary | ICD-10-CM | POA: Diagnosis not present

## 2019-09-10 DIAGNOSIS — K769 Liver disease, unspecified: Secondary | ICD-10-CM | POA: Diagnosis not present

## 2019-09-10 DIAGNOSIS — N186 End stage renal disease: Secondary | ICD-10-CM | POA: Diagnosis not present

## 2019-09-10 DIAGNOSIS — Z992 Dependence on renal dialysis: Secondary | ICD-10-CM | POA: Diagnosis not present

## 2019-09-10 DIAGNOSIS — E876 Hypokalemia: Secondary | ICD-10-CM | POA: Diagnosis not present

## 2019-09-11 DIAGNOSIS — N186 End stage renal disease: Secondary | ICD-10-CM | POA: Diagnosis not present

## 2019-09-11 DIAGNOSIS — K769 Liver disease, unspecified: Secondary | ICD-10-CM | POA: Diagnosis not present

## 2019-09-11 DIAGNOSIS — E876 Hypokalemia: Secondary | ICD-10-CM | POA: Diagnosis not present

## 2019-09-11 DIAGNOSIS — Z992 Dependence on renal dialysis: Secondary | ICD-10-CM | POA: Diagnosis not present

## 2019-09-11 DIAGNOSIS — Z4932 Encounter for adequacy testing for peritoneal dialysis: Secondary | ICD-10-CM | POA: Diagnosis not present

## 2019-09-12 DIAGNOSIS — K769 Liver disease, unspecified: Secondary | ICD-10-CM | POA: Diagnosis not present

## 2019-09-12 DIAGNOSIS — E876 Hypokalemia: Secondary | ICD-10-CM | POA: Diagnosis not present

## 2019-09-12 DIAGNOSIS — Z992 Dependence on renal dialysis: Secondary | ICD-10-CM | POA: Diagnosis not present

## 2019-09-12 DIAGNOSIS — N186 End stage renal disease: Secondary | ICD-10-CM | POA: Diagnosis not present

## 2019-09-12 DIAGNOSIS — Z4932 Encounter for adequacy testing for peritoneal dialysis: Secondary | ICD-10-CM | POA: Diagnosis not present

## 2019-09-13 DIAGNOSIS — Z992 Dependence on renal dialysis: Secondary | ICD-10-CM | POA: Diagnosis not present

## 2019-09-13 DIAGNOSIS — Z4932 Encounter for adequacy testing for peritoneal dialysis: Secondary | ICD-10-CM | POA: Diagnosis not present

## 2019-09-13 DIAGNOSIS — N186 End stage renal disease: Secondary | ICD-10-CM | POA: Diagnosis not present

## 2019-09-13 DIAGNOSIS — K769 Liver disease, unspecified: Secondary | ICD-10-CM | POA: Diagnosis not present

## 2019-09-13 DIAGNOSIS — E876 Hypokalemia: Secondary | ICD-10-CM | POA: Diagnosis not present

## 2019-09-14 DIAGNOSIS — E876 Hypokalemia: Secondary | ICD-10-CM | POA: Diagnosis not present

## 2019-09-14 DIAGNOSIS — N186 End stage renal disease: Secondary | ICD-10-CM | POA: Diagnosis not present

## 2019-09-14 DIAGNOSIS — Z992 Dependence on renal dialysis: Secondary | ICD-10-CM | POA: Diagnosis not present

## 2019-09-14 DIAGNOSIS — Z4932 Encounter for adequacy testing for peritoneal dialysis: Secondary | ICD-10-CM | POA: Diagnosis not present

## 2019-09-14 DIAGNOSIS — K769 Liver disease, unspecified: Secondary | ICD-10-CM | POA: Diagnosis not present

## 2019-09-15 DIAGNOSIS — Z992 Dependence on renal dialysis: Secondary | ICD-10-CM | POA: Diagnosis not present

## 2019-09-15 DIAGNOSIS — K769 Liver disease, unspecified: Secondary | ICD-10-CM | POA: Diagnosis not present

## 2019-09-15 DIAGNOSIS — Z4932 Encounter for adequacy testing for peritoneal dialysis: Secondary | ICD-10-CM | POA: Diagnosis not present

## 2019-09-15 DIAGNOSIS — N186 End stage renal disease: Secondary | ICD-10-CM | POA: Diagnosis not present

## 2019-09-15 DIAGNOSIS — E876 Hypokalemia: Secondary | ICD-10-CM | POA: Diagnosis not present

## 2019-09-16 DIAGNOSIS — E876 Hypokalemia: Secondary | ICD-10-CM | POA: Diagnosis not present

## 2019-09-16 DIAGNOSIS — K769 Liver disease, unspecified: Secondary | ICD-10-CM | POA: Diagnosis not present

## 2019-09-16 DIAGNOSIS — Z992 Dependence on renal dialysis: Secondary | ICD-10-CM | POA: Diagnosis not present

## 2019-09-16 DIAGNOSIS — N186 End stage renal disease: Secondary | ICD-10-CM | POA: Diagnosis not present

## 2019-09-16 DIAGNOSIS — Z4932 Encounter for adequacy testing for peritoneal dialysis: Secondary | ICD-10-CM | POA: Diagnosis not present

## 2019-09-17 DIAGNOSIS — K769 Liver disease, unspecified: Secondary | ICD-10-CM | POA: Diagnosis not present

## 2019-09-17 DIAGNOSIS — N186 End stage renal disease: Secondary | ICD-10-CM | POA: Diagnosis not present

## 2019-09-17 DIAGNOSIS — Z4932 Encounter for adequacy testing for peritoneal dialysis: Secondary | ICD-10-CM | POA: Diagnosis not present

## 2019-09-17 DIAGNOSIS — E876 Hypokalemia: Secondary | ICD-10-CM | POA: Diagnosis not present

## 2019-09-17 DIAGNOSIS — Z992 Dependence on renal dialysis: Secondary | ICD-10-CM | POA: Diagnosis not present

## 2019-09-18 DIAGNOSIS — Z4932 Encounter for adequacy testing for peritoneal dialysis: Secondary | ICD-10-CM | POA: Diagnosis not present

## 2019-09-18 DIAGNOSIS — E876 Hypokalemia: Secondary | ICD-10-CM | POA: Diagnosis not present

## 2019-09-18 DIAGNOSIS — K769 Liver disease, unspecified: Secondary | ICD-10-CM | POA: Diagnosis not present

## 2019-09-18 DIAGNOSIS — N186 End stage renal disease: Secondary | ICD-10-CM | POA: Diagnosis not present

## 2019-09-18 DIAGNOSIS — Z992 Dependence on renal dialysis: Secondary | ICD-10-CM | POA: Diagnosis not present

## 2019-09-19 DIAGNOSIS — E876 Hypokalemia: Secondary | ICD-10-CM | POA: Diagnosis not present

## 2019-09-19 DIAGNOSIS — Z992 Dependence on renal dialysis: Secondary | ICD-10-CM | POA: Diagnosis not present

## 2019-09-19 DIAGNOSIS — N186 End stage renal disease: Secondary | ICD-10-CM | POA: Diagnosis not present

## 2019-09-19 DIAGNOSIS — K769 Liver disease, unspecified: Secondary | ICD-10-CM | POA: Diagnosis not present

## 2019-09-19 DIAGNOSIS — Z4932 Encounter for adequacy testing for peritoneal dialysis: Secondary | ICD-10-CM | POA: Diagnosis not present

## 2019-09-20 DIAGNOSIS — K769 Liver disease, unspecified: Secondary | ICD-10-CM | POA: Diagnosis not present

## 2019-09-20 DIAGNOSIS — E876 Hypokalemia: Secondary | ICD-10-CM | POA: Diagnosis not present

## 2019-09-20 DIAGNOSIS — Z4932 Encounter for adequacy testing for peritoneal dialysis: Secondary | ICD-10-CM | POA: Diagnosis not present

## 2019-09-20 DIAGNOSIS — N186 End stage renal disease: Secondary | ICD-10-CM | POA: Diagnosis not present

## 2019-09-20 DIAGNOSIS — Z992 Dependence on renal dialysis: Secondary | ICD-10-CM | POA: Diagnosis not present

## 2019-09-21 DIAGNOSIS — N186 End stage renal disease: Secondary | ICD-10-CM | POA: Diagnosis not present

## 2019-09-21 DIAGNOSIS — Z4932 Encounter for adequacy testing for peritoneal dialysis: Secondary | ICD-10-CM | POA: Diagnosis not present

## 2019-09-21 DIAGNOSIS — Z992 Dependence on renal dialysis: Secondary | ICD-10-CM | POA: Diagnosis not present

## 2019-09-21 DIAGNOSIS — K769 Liver disease, unspecified: Secondary | ICD-10-CM | POA: Diagnosis not present

## 2019-09-21 DIAGNOSIS — E876 Hypokalemia: Secondary | ICD-10-CM | POA: Diagnosis not present

## 2019-09-22 DIAGNOSIS — K769 Liver disease, unspecified: Secondary | ICD-10-CM | POA: Diagnosis not present

## 2019-09-22 DIAGNOSIS — E876 Hypokalemia: Secondary | ICD-10-CM | POA: Diagnosis not present

## 2019-09-22 DIAGNOSIS — Z992 Dependence on renal dialysis: Secondary | ICD-10-CM | POA: Diagnosis not present

## 2019-09-22 DIAGNOSIS — N186 End stage renal disease: Secondary | ICD-10-CM | POA: Diagnosis not present

## 2019-09-22 DIAGNOSIS — Z4932 Encounter for adequacy testing for peritoneal dialysis: Secondary | ICD-10-CM | POA: Diagnosis not present

## 2019-09-23 DIAGNOSIS — Z4932 Encounter for adequacy testing for peritoneal dialysis: Secondary | ICD-10-CM | POA: Diagnosis not present

## 2019-09-23 DIAGNOSIS — N186 End stage renal disease: Secondary | ICD-10-CM | POA: Diagnosis not present

## 2019-09-23 DIAGNOSIS — K769 Liver disease, unspecified: Secondary | ICD-10-CM | POA: Diagnosis not present

## 2019-09-23 DIAGNOSIS — E876 Hypokalemia: Secondary | ICD-10-CM | POA: Diagnosis not present

## 2019-09-23 DIAGNOSIS — Z992 Dependence on renal dialysis: Secondary | ICD-10-CM | POA: Diagnosis not present

## 2019-09-24 DIAGNOSIS — Z4932 Encounter for adequacy testing for peritoneal dialysis: Secondary | ICD-10-CM | POA: Diagnosis not present

## 2019-09-24 DIAGNOSIS — K769 Liver disease, unspecified: Secondary | ICD-10-CM | POA: Diagnosis not present

## 2019-09-24 DIAGNOSIS — E876 Hypokalemia: Secondary | ICD-10-CM | POA: Diagnosis not present

## 2019-09-24 DIAGNOSIS — N186 End stage renal disease: Secondary | ICD-10-CM | POA: Diagnosis not present

## 2019-09-24 DIAGNOSIS — Z992 Dependence on renal dialysis: Secondary | ICD-10-CM | POA: Diagnosis not present

## 2019-09-25 DIAGNOSIS — Z992 Dependence on renal dialysis: Secondary | ICD-10-CM | POA: Diagnosis not present

## 2019-09-25 DIAGNOSIS — N186 End stage renal disease: Secondary | ICD-10-CM | POA: Diagnosis not present

## 2019-09-25 DIAGNOSIS — Z4932 Encounter for adequacy testing for peritoneal dialysis: Secondary | ICD-10-CM | POA: Diagnosis not present

## 2019-09-25 DIAGNOSIS — K769 Liver disease, unspecified: Secondary | ICD-10-CM | POA: Diagnosis not present

## 2019-09-25 DIAGNOSIS — E876 Hypokalemia: Secondary | ICD-10-CM | POA: Diagnosis not present

## 2019-09-26 DIAGNOSIS — K769 Liver disease, unspecified: Secondary | ICD-10-CM | POA: Diagnosis not present

## 2019-09-26 DIAGNOSIS — E876 Hypokalemia: Secondary | ICD-10-CM | POA: Diagnosis not present

## 2019-09-26 DIAGNOSIS — Z4932 Encounter for adequacy testing for peritoneal dialysis: Secondary | ICD-10-CM | POA: Diagnosis not present

## 2019-09-26 DIAGNOSIS — Z992 Dependence on renal dialysis: Secondary | ICD-10-CM | POA: Diagnosis not present

## 2019-09-26 DIAGNOSIS — N186 End stage renal disease: Secondary | ICD-10-CM | POA: Diagnosis not present

## 2019-09-27 DIAGNOSIS — N186 End stage renal disease: Secondary | ICD-10-CM | POA: Diagnosis not present

## 2019-09-27 DIAGNOSIS — E876 Hypokalemia: Secondary | ICD-10-CM | POA: Diagnosis not present

## 2019-09-27 DIAGNOSIS — Z992 Dependence on renal dialysis: Secondary | ICD-10-CM | POA: Diagnosis not present

## 2019-09-27 DIAGNOSIS — K769 Liver disease, unspecified: Secondary | ICD-10-CM | POA: Diagnosis not present

## 2019-09-27 DIAGNOSIS — Z4932 Encounter for adequacy testing for peritoneal dialysis: Secondary | ICD-10-CM | POA: Diagnosis not present

## 2019-09-28 DIAGNOSIS — E876 Hypokalemia: Secondary | ICD-10-CM | POA: Diagnosis not present

## 2019-09-28 DIAGNOSIS — Z992 Dependence on renal dialysis: Secondary | ICD-10-CM | POA: Diagnosis not present

## 2019-09-28 DIAGNOSIS — N186 End stage renal disease: Secondary | ICD-10-CM | POA: Diagnosis not present

## 2019-09-28 DIAGNOSIS — K769 Liver disease, unspecified: Secondary | ICD-10-CM | POA: Diagnosis not present

## 2019-09-28 DIAGNOSIS — Z4932 Encounter for adequacy testing for peritoneal dialysis: Secondary | ICD-10-CM | POA: Diagnosis not present

## 2019-09-29 DIAGNOSIS — K769 Liver disease, unspecified: Secondary | ICD-10-CM | POA: Diagnosis not present

## 2019-09-29 DIAGNOSIS — N186 End stage renal disease: Secondary | ICD-10-CM | POA: Diagnosis not present

## 2019-09-29 DIAGNOSIS — Z992 Dependence on renal dialysis: Secondary | ICD-10-CM | POA: Diagnosis not present

## 2019-09-29 DIAGNOSIS — Z4932 Encounter for adequacy testing for peritoneal dialysis: Secondary | ICD-10-CM | POA: Diagnosis not present

## 2019-09-29 DIAGNOSIS — E876 Hypokalemia: Secondary | ICD-10-CM | POA: Diagnosis not present

## 2019-09-30 DIAGNOSIS — N186 End stage renal disease: Secondary | ICD-10-CM | POA: Diagnosis not present

## 2019-09-30 DIAGNOSIS — Z4932 Encounter for adequacy testing for peritoneal dialysis: Secondary | ICD-10-CM | POA: Diagnosis not present

## 2019-09-30 DIAGNOSIS — E876 Hypokalemia: Secondary | ICD-10-CM | POA: Diagnosis not present

## 2019-09-30 DIAGNOSIS — K769 Liver disease, unspecified: Secondary | ICD-10-CM | POA: Diagnosis not present

## 2019-09-30 DIAGNOSIS — Z992 Dependence on renal dialysis: Secondary | ICD-10-CM | POA: Diagnosis not present

## 2019-10-01 DIAGNOSIS — N186 End stage renal disease: Secondary | ICD-10-CM | POA: Diagnosis not present

## 2019-10-01 DIAGNOSIS — K769 Liver disease, unspecified: Secondary | ICD-10-CM | POA: Diagnosis not present

## 2019-10-01 DIAGNOSIS — Z992 Dependence on renal dialysis: Secondary | ICD-10-CM | POA: Diagnosis not present

## 2019-10-01 DIAGNOSIS — Z4932 Encounter for adequacy testing for peritoneal dialysis: Secondary | ICD-10-CM | POA: Diagnosis not present

## 2019-10-01 DIAGNOSIS — E876 Hypokalemia: Secondary | ICD-10-CM | POA: Diagnosis not present

## 2019-10-02 DIAGNOSIS — K769 Liver disease, unspecified: Secondary | ICD-10-CM | POA: Diagnosis not present

## 2019-10-02 DIAGNOSIS — Z4932 Encounter for adequacy testing for peritoneal dialysis: Secondary | ICD-10-CM | POA: Diagnosis not present

## 2019-10-02 DIAGNOSIS — E876 Hypokalemia: Secondary | ICD-10-CM | POA: Diagnosis not present

## 2019-10-02 DIAGNOSIS — Z992 Dependence on renal dialysis: Secondary | ICD-10-CM | POA: Diagnosis not present

## 2019-10-02 DIAGNOSIS — N186 End stage renal disease: Secondary | ICD-10-CM | POA: Diagnosis not present

## 2019-10-03 ENCOUNTER — Other Ambulatory Visit: Payer: Self-pay

## 2019-10-03 ENCOUNTER — Encounter (HOSPITAL_COMMUNITY): Payer: Self-pay | Admitting: Emergency Medicine

## 2019-10-03 ENCOUNTER — Inpatient Hospital Stay (HOSPITAL_COMMUNITY)
Admission: EM | Admit: 2019-10-03 | Discharge: 2019-10-10 | DRG: 919 | Disposition: A | Discharge status: Home / Self Care | Payer: Medicare Other | Attending: Internal Medicine | Admitting: Internal Medicine

## 2019-10-03 DIAGNOSIS — K650 Generalized (acute) peritonitis: Secondary | ICD-10-CM | POA: Diagnosis not present

## 2019-10-03 DIAGNOSIS — K769 Liver disease, unspecified: Secondary | ICD-10-CM | POA: Diagnosis not present

## 2019-10-03 DIAGNOSIS — N186 End stage renal disease: Secondary | ICD-10-CM | POA: Diagnosis present

## 2019-10-03 DIAGNOSIS — R109 Unspecified abdominal pain: Secondary | ICD-10-CM | POA: Diagnosis not present

## 2019-10-03 DIAGNOSIS — D509 Iron deficiency anemia, unspecified: Secondary | ICD-10-CM | POA: Diagnosis not present

## 2019-10-03 DIAGNOSIS — Z20822 Contact with and (suspected) exposure to covid-19: Secondary | ICD-10-CM | POA: Diagnosis not present

## 2019-10-03 DIAGNOSIS — Z7982 Long term (current) use of aspirin: Secondary | ICD-10-CM

## 2019-10-03 DIAGNOSIS — K659 Peritonitis, unspecified: Secondary | ICD-10-CM | POA: Diagnosis not present

## 2019-10-03 DIAGNOSIS — Z8249 Family history of ischemic heart disease and other diseases of the circulatory system: Secondary | ICD-10-CM

## 2019-10-03 DIAGNOSIS — K5900 Constipation, unspecified: Secondary | ICD-10-CM | POA: Diagnosis present

## 2019-10-03 DIAGNOSIS — Z79899 Other long term (current) drug therapy: Secondary | ICD-10-CM

## 2019-10-03 DIAGNOSIS — R17 Unspecified jaundice: Secondary | ICD-10-CM | POA: Diagnosis not present

## 2019-10-03 DIAGNOSIS — K219 Gastro-esophageal reflux disease without esophagitis: Secondary | ICD-10-CM | POA: Diagnosis present

## 2019-10-03 DIAGNOSIS — R82998 Other abnormal findings in urine: Secondary | ICD-10-CM | POA: Diagnosis not present

## 2019-10-03 DIAGNOSIS — D259 Leiomyoma of uterus, unspecified: Secondary | ICD-10-CM | POA: Diagnosis present

## 2019-10-03 DIAGNOSIS — I129 Hypertensive chronic kidney disease with stage 1 through stage 4 chronic kidney disease, or unspecified chronic kidney disease: Secondary | ICD-10-CM | POA: Diagnosis not present

## 2019-10-03 DIAGNOSIS — Z992 Dependence on renal dialysis: Secondary | ICD-10-CM

## 2019-10-03 DIAGNOSIS — T85611A Breakdown (mechanical) of intraperitoneal dialysis catheter, initial encounter: Secondary | ICD-10-CM

## 2019-10-03 DIAGNOSIS — D631 Anemia in chronic kidney disease: Secondary | ICD-10-CM | POA: Diagnosis present

## 2019-10-03 DIAGNOSIS — N2581 Secondary hyperparathyroidism of renal origin: Secondary | ICD-10-CM | POA: Diagnosis not present

## 2019-10-03 DIAGNOSIS — I120 Hypertensive chronic kidney disease with stage 5 chronic kidney disease or end stage renal disease: Secondary | ICD-10-CM | POA: Diagnosis present

## 2019-10-03 DIAGNOSIS — Y830 Surgical operation with transplant of whole organ as the cause of abnormal reaction of the patient, or of later complication, without mention of misadventure at the time of the procedure: Secondary | ICD-10-CM | POA: Diagnosis present

## 2019-10-03 DIAGNOSIS — E440 Moderate protein-calorie malnutrition: Secondary | ICD-10-CM | POA: Diagnosis not present

## 2019-10-03 DIAGNOSIS — A498 Other bacterial infections of unspecified site: Secondary | ICD-10-CM

## 2019-10-03 DIAGNOSIS — T8612 Kidney transplant failure: Secondary | ICD-10-CM | POA: Diagnosis present

## 2019-10-03 DIAGNOSIS — E876 Hypokalemia: Secondary | ICD-10-CM | POA: Diagnosis present

## 2019-10-03 DIAGNOSIS — T8571XA Infection and inflammatory reaction due to peritoneal dialysis catheter, initial encounter: Principal | ICD-10-CM | POA: Diagnosis present

## 2019-10-03 DIAGNOSIS — F17210 Nicotine dependence, cigarettes, uncomplicated: Secondary | ICD-10-CM | POA: Diagnosis present

## 2019-10-03 DIAGNOSIS — Z880 Allergy status to penicillin: Secondary | ICD-10-CM

## 2019-10-03 DIAGNOSIS — Z03818 Encounter for observation for suspected exposure to other biological agents ruled out: Secondary | ICD-10-CM | POA: Diagnosis not present

## 2019-10-03 DIAGNOSIS — Z23 Encounter for immunization: Secondary | ICD-10-CM

## 2019-10-03 HISTORY — DX: Peritonitis, unspecified: K65.9

## 2019-10-03 NOTE — ED Triage Notes (Signed)
Patient reports her peritoneal dialysis catheter will not drain this evening , denies abdominal pain / no fever or chills .

## 2019-10-04 ENCOUNTER — Emergency Department (HOSPITAL_COMMUNITY): Payer: Medicare Other

## 2019-10-04 ENCOUNTER — Encounter (HOSPITAL_COMMUNITY): Payer: Self-pay | Admitting: Emergency Medicine

## 2019-10-04 DIAGNOSIS — Z03818 Encounter for observation for suspected exposure to other biological agents ruled out: Secondary | ICD-10-CM | POA: Diagnosis not present

## 2019-10-04 DIAGNOSIS — R1084 Generalized abdominal pain: Secondary | ICD-10-CM | POA: Diagnosis not present

## 2019-10-04 DIAGNOSIS — R109 Unspecified abdominal pain: Secondary | ICD-10-CM | POA: Diagnosis not present

## 2019-10-04 DIAGNOSIS — Z8249 Family history of ischemic heart disease and other diseases of the circulatory system: Secondary | ICD-10-CM | POA: Diagnosis not present

## 2019-10-04 DIAGNOSIS — N250 Renal osteodystrophy: Secondary | ICD-10-CM | POA: Diagnosis not present

## 2019-10-04 DIAGNOSIS — K659 Peritonitis, unspecified: Secondary | ICD-10-CM | POA: Diagnosis present

## 2019-10-04 DIAGNOSIS — Z880 Allergy status to penicillin: Secondary | ICD-10-CM

## 2019-10-04 DIAGNOSIS — Z79899 Other long term (current) drug therapy: Secondary | ICD-10-CM | POA: Diagnosis not present

## 2019-10-04 DIAGNOSIS — T8571XA Infection and inflammatory reaction due to peritoneal dialysis catheter, initial encounter: Secondary | ICD-10-CM | POA: Diagnosis present

## 2019-10-04 DIAGNOSIS — K652 Spontaneous bacterial peritonitis: Secondary | ICD-10-CM | POA: Diagnosis not present

## 2019-10-04 DIAGNOSIS — K219 Gastro-esophageal reflux disease without esophagitis: Secondary | ICD-10-CM | POA: Diagnosis present

## 2019-10-04 DIAGNOSIS — K650 Generalized (acute) peritonitis: Secondary | ICD-10-CM | POA: Diagnosis not present

## 2019-10-04 DIAGNOSIS — Z7982 Long term (current) use of aspirin: Secondary | ICD-10-CM | POA: Diagnosis not present

## 2019-10-04 DIAGNOSIS — E876 Hypokalemia: Secondary | ICD-10-CM | POA: Diagnosis present

## 2019-10-04 DIAGNOSIS — T8612 Kidney transplant failure: Secondary | ICD-10-CM

## 2019-10-04 DIAGNOSIS — T85611A Breakdown (mechanical) of intraperitoneal dialysis catheter, initial encounter: Secondary | ICD-10-CM | POA: Diagnosis not present

## 2019-10-04 DIAGNOSIS — K658 Other peritonitis: Secondary | ICD-10-CM | POA: Diagnosis not present

## 2019-10-04 DIAGNOSIS — Z992 Dependence on renal dialysis: Secondary | ICD-10-CM | POA: Diagnosis not present

## 2019-10-04 DIAGNOSIS — I120 Hypertensive chronic kidney disease with stage 5 chronic kidney disease or end stage renal disease: Secondary | ICD-10-CM | POA: Diagnosis present

## 2019-10-04 DIAGNOSIS — K5900 Constipation, unspecified: Secondary | ICD-10-CM | POA: Diagnosis present

## 2019-10-04 DIAGNOSIS — N186 End stage renal disease: Secondary | ICD-10-CM | POA: Diagnosis present

## 2019-10-04 DIAGNOSIS — D631 Anemia in chronic kidney disease: Secondary | ICD-10-CM | POA: Diagnosis present

## 2019-10-04 DIAGNOSIS — Z20822 Contact with and (suspected) exposure to covid-19: Secondary | ICD-10-CM | POA: Diagnosis present

## 2019-10-04 DIAGNOSIS — F17210 Nicotine dependence, cigarettes, uncomplicated: Secondary | ICD-10-CM

## 2019-10-04 DIAGNOSIS — D259 Leiomyoma of uterus, unspecified: Secondary | ICD-10-CM | POA: Diagnosis present

## 2019-10-04 DIAGNOSIS — B9689 Other specified bacterial agents as the cause of diseases classified elsewhere: Secondary | ICD-10-CM | POA: Diagnosis not present

## 2019-10-04 DIAGNOSIS — Z23 Encounter for immunization: Secondary | ICD-10-CM | POA: Diagnosis not present

## 2019-10-04 DIAGNOSIS — Y830 Surgical operation with transplant of whole organ as the cause of abnormal reaction of the patient, or of later complication, without mention of misadventure at the time of the procedure: Secondary | ICD-10-CM | POA: Diagnosis present

## 2019-10-04 LAB — CBC WITH DIFFERENTIAL/PLATELET
Abs Immature Granulocytes: 0.1 10*3/uL — ABNORMAL HIGH (ref 0.00–0.07)
Basophils Absolute: 0 10*3/uL (ref 0.0–0.1)
Basophils Relative: 0 %
Eosinophils Absolute: 0.2 10*3/uL (ref 0.0–0.5)
Eosinophils Relative: 2 %
HCT: 40.4 % (ref 36.0–46.0)
Hemoglobin: 13.6 g/dL (ref 12.0–15.0)
Immature Granulocytes: 1 %
Lymphocytes Relative: 9 %
Lymphs Abs: 1 10*3/uL (ref 0.7–4.0)
MCH: 32.8 pg (ref 26.0–34.0)
MCHC: 33.7 g/dL (ref 30.0–36.0)
MCV: 97.3 fL (ref 80.0–100.0)
Monocytes Absolute: 0.7 10*3/uL (ref 0.1–1.0)
Monocytes Relative: 6 %
Neutro Abs: 9.5 10*3/uL — ABNORMAL HIGH (ref 1.7–7.7)
Neutrophils Relative %: 82 %
Platelets: 161 10*3/uL (ref 150–400)
RBC: 4.15 MIL/uL (ref 3.87–5.11)
RDW: 14.2 % (ref 11.5–15.5)
WBC: 11.4 10*3/uL — ABNORMAL HIGH (ref 4.0–10.5)
nRBC: 0 % (ref 0.0–0.2)

## 2019-10-04 LAB — RESPIRATORY PANEL BY RT PCR (FLU A&B, COVID)
Influenza A by PCR: NEGATIVE
Influenza B by PCR: NEGATIVE
SARS Coronavirus 2 by RT PCR: NEGATIVE

## 2019-10-04 LAB — BODY FLUID CELL COUNT WITH DIFFERENTIAL
Eos, Fluid: 0 %
Lymphs, Fluid: 4 %
Monocyte-Macrophage-Serous Fluid: 1 % — ABNORMAL LOW (ref 50–90)
Neutrophil Count, Fluid: 95 % — ABNORMAL HIGH (ref 0–25)
Total Nucleated Cell Count, Fluid: 2230 cu mm — ABNORMAL HIGH (ref 0–1000)

## 2019-10-04 LAB — BASIC METABOLIC PANEL
Anion gap: 19 — ABNORMAL HIGH (ref 5–15)
BUN: 79 mg/dL — ABNORMAL HIGH (ref 6–20)
CO2: 23 mmol/L (ref 22–32)
Calcium: 9.5 mg/dL (ref 8.9–10.3)
Chloride: 91 mmol/L — ABNORMAL LOW (ref 98–111)
Creatinine, Ser: 14.5 mg/dL — ABNORMAL HIGH (ref 0.44–1.00)
GFR calc Af Amer: 3 mL/min — ABNORMAL LOW (ref >60)
GFR calc non Af Amer: 3 mL/min — ABNORMAL LOW (ref >60)
Glucose, Bld: 86 mg/dL (ref 70–99)
Potassium: 3 mmol/L — ABNORMAL LOW (ref 3.5–5.1)
Sodium: 133 mmol/L — ABNORMAL LOW (ref 135–145)

## 2019-10-04 IMAGING — DX DG ABDOMEN ACUTE W/ 1V CHEST
3 series · 3 of 3 positions shown · non-contrast
Comparison: [DATE]

CLINICAL DATA: Abdominal pain

EXAM:
DG ABDOMEN ACUTE W/ 1V CHEST

[chest pa]
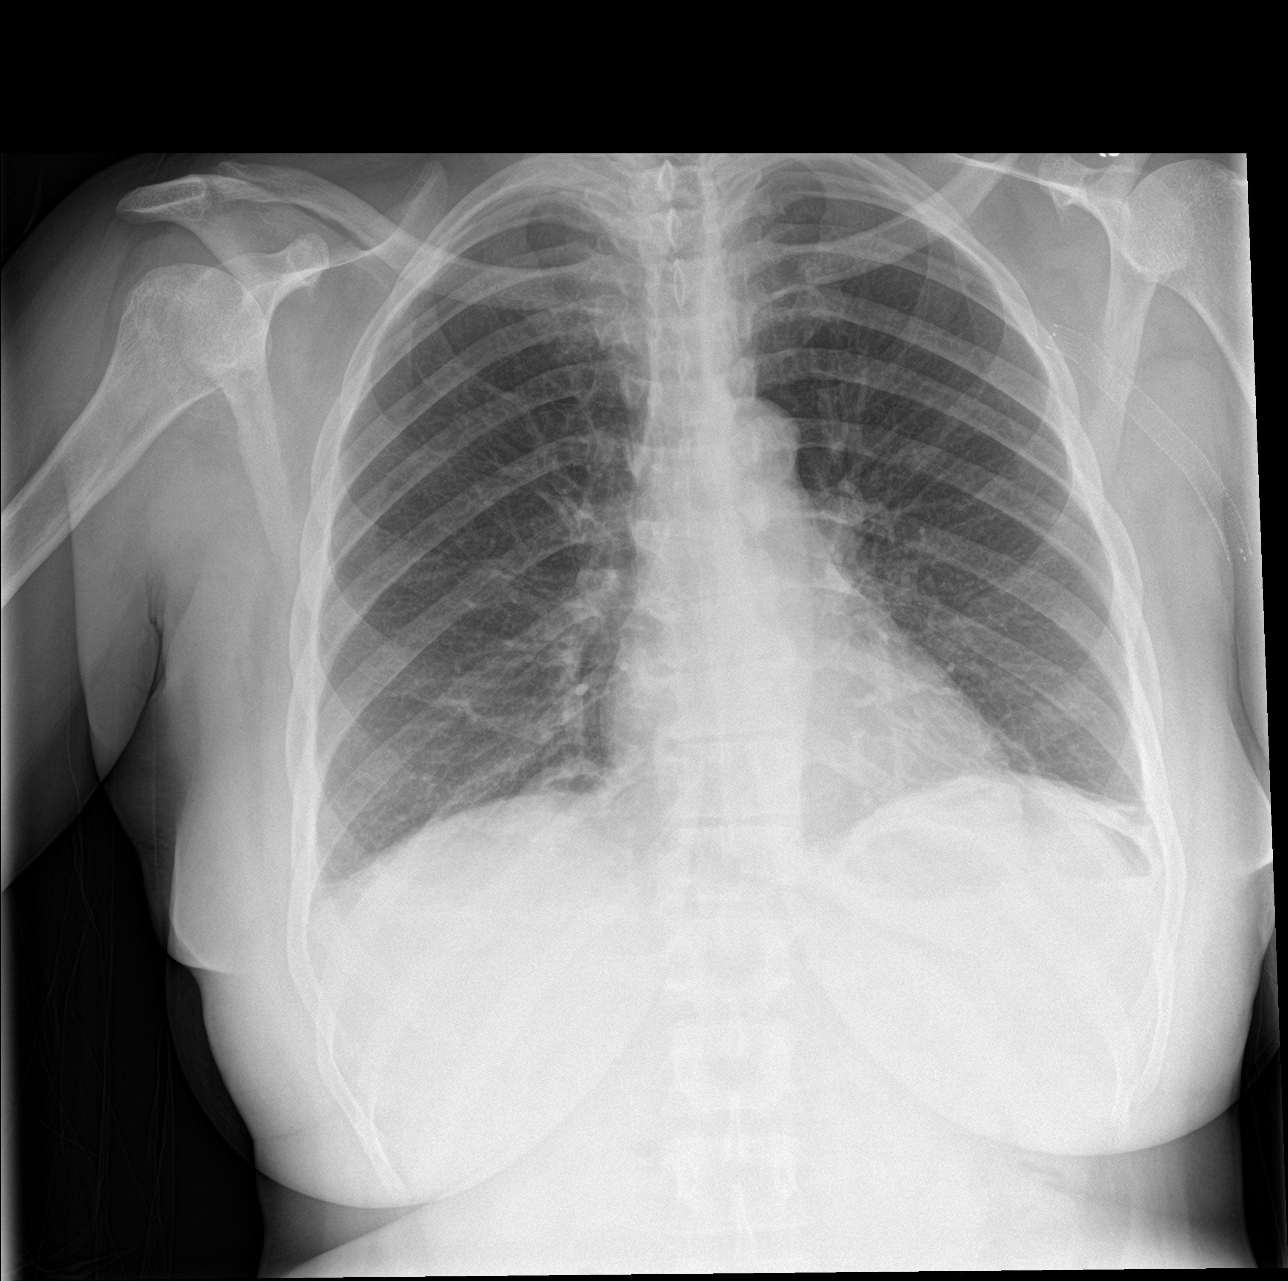

[abdomen erect]
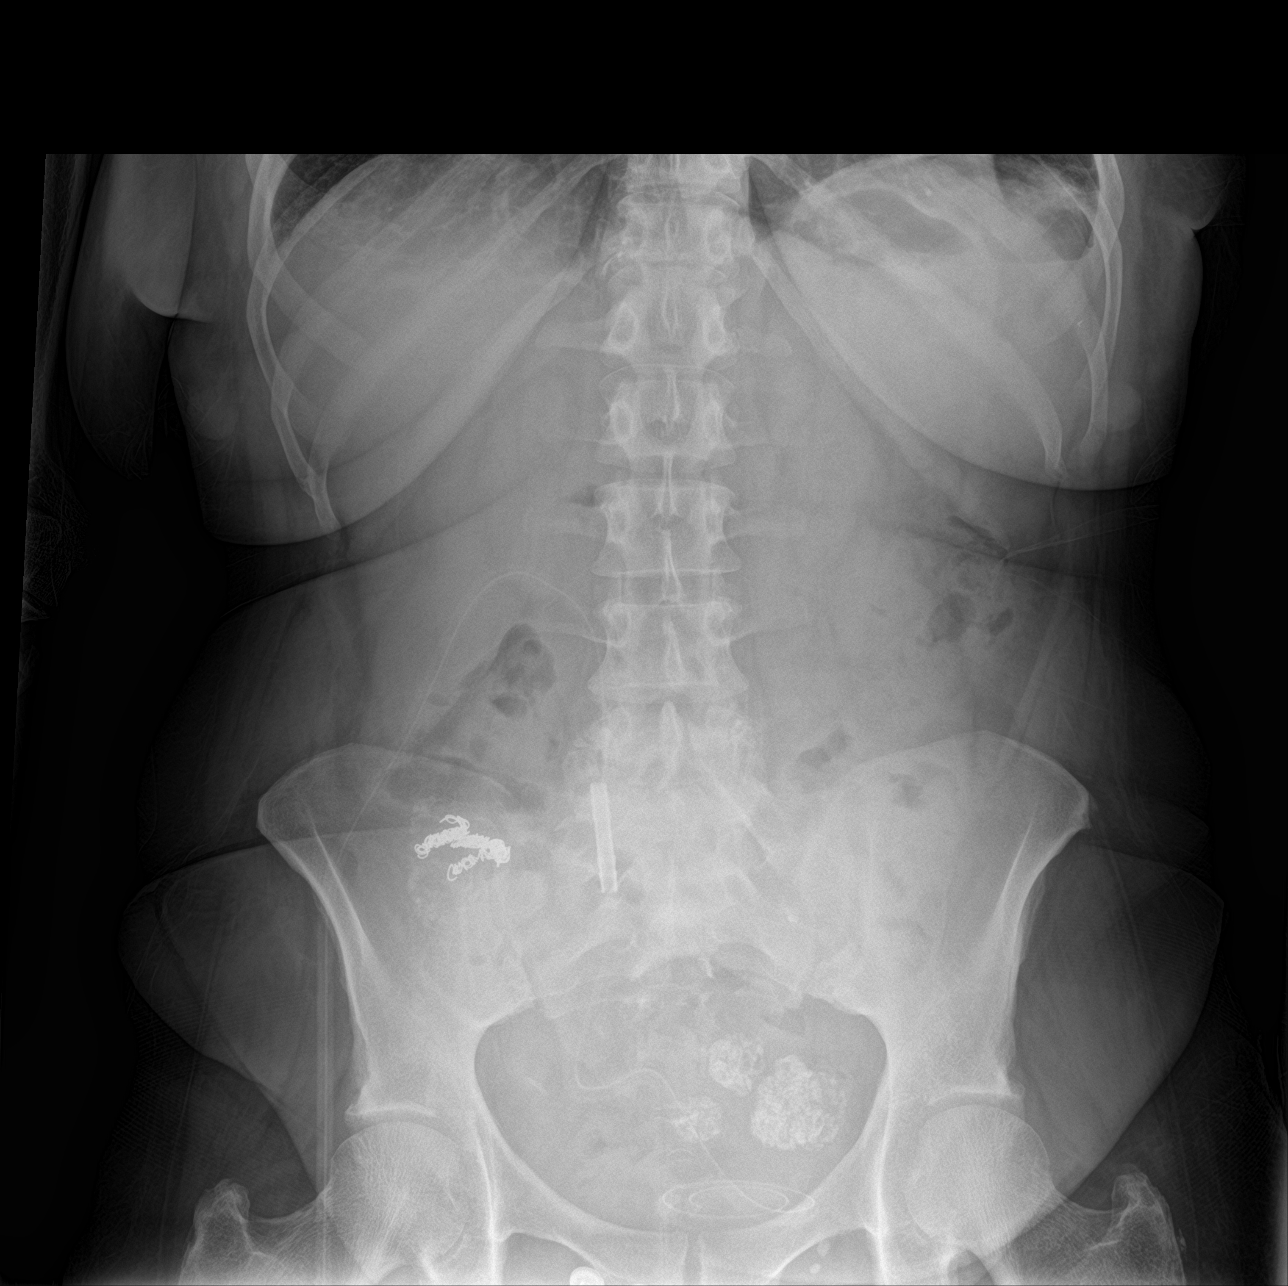

[abdomen supine]
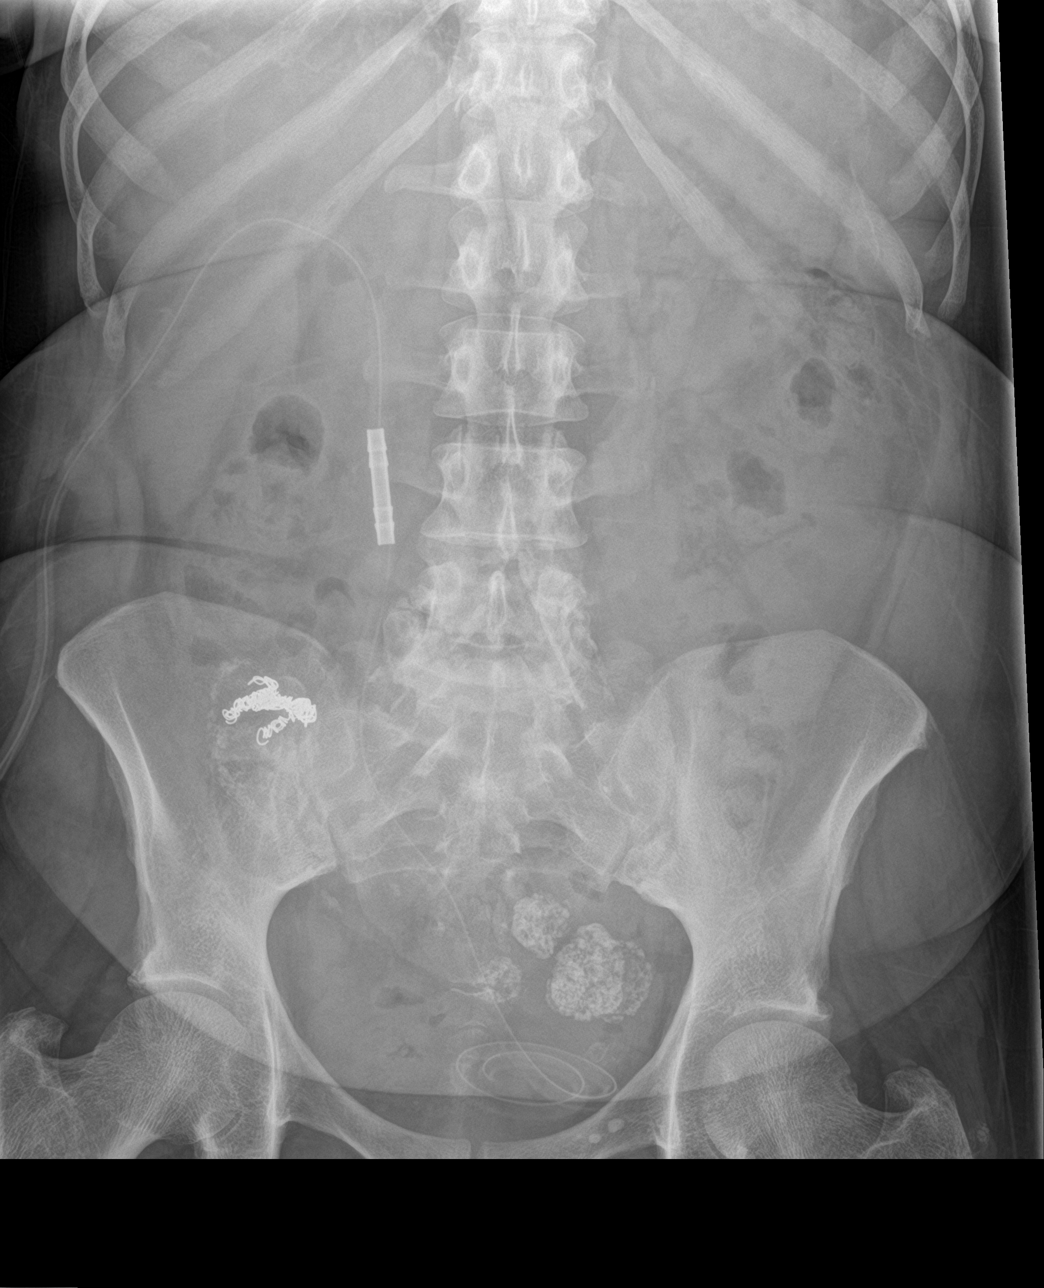

[3 of 3 positions shown; findings below may reference images not displayed]

FINDINGS: There is no evidence of dilated bowel loops or free intraperitoneal
air. A right-sided peritoneal dialysis catheter is coiled within the
deep pelvis. Partially calcified uterine fibroids are seen. Heart
size and mediastinal contours are within normal limits. Both lungs
are clear.
IMPRESSION: Nonobstructive bowel gas pattern.  No acute cardiopulmonary disease.

## 2019-10-04 MED ORDER — MORPHINE SULFATE (PF) 4 MG/ML IV SOLN
4.0000 mg | Freq: Once | INTRAVENOUS | Status: AC
  Administered 2019-10-04: 4 mg via INTRAVENOUS
  Filled 2019-10-04: qty 1

## 2019-10-04 MED ORDER — GENTAMICIN SULFATE 0.1 % EX CREA
1.0000 "application " | TOPICAL_CREAM | Freq: Every day | CUTANEOUS | Status: DC
  Filled 2019-10-04 (×2): qty 15

## 2019-10-04 MED ORDER — FENTANYL CITRATE (PF) 100 MCG/2ML IJ SOLN
50.0000 ug | Freq: Once | INTRAMUSCULAR | Status: AC
  Administered 2019-10-04: 50 ug via INTRAVENOUS
  Filled 2019-10-04: qty 2

## 2019-10-04 MED ORDER — SUCROFERRIC OXYHYDROXIDE 500 MG PO CHEW
1500.0000 mg | CHEWABLE_TABLET | Freq: Three times a day (TID) | ORAL | Status: DC
  Administered 2019-10-04 – 2019-10-09 (×12): 1500 mg via ORAL
  Filled 2019-10-04 (×20): qty 3

## 2019-10-04 MED ORDER — HEPARIN 1000 UNIT/ML FOR PERITONEAL DIALYSIS
INTRAPERITONEAL | Status: DC | PRN
  Filled 2019-10-04: qty 5000

## 2019-10-04 MED ORDER — DELFLEX-LC/2.5% DEXTROSE 394 MOSM/L IP SOLN: INTRAPERITONEAL | Status: DC

## 2019-10-04 MED ORDER — SODIUM CHLORIDE 0.9 % IV SOLN
1.0000 g | Freq: Once | INTRAVENOUS | Status: DC
  Filled 2019-10-04: qty 1

## 2019-10-04 MED ORDER — FENTANYL CITRATE (PF) 100 MCG/2ML IJ SOLN
50.0000 ug | Freq: Once | INTRAMUSCULAR | Status: AC
  Administered 2019-10-04: 06:00:00 50 ug via INTRAVENOUS
  Filled 2019-10-04: qty 2

## 2019-10-04 MED ORDER — POTASSIUM CHLORIDE CRYS ER 20 MEQ PO TBCR
40.0000 meq | EXTENDED_RELEASE_TABLET | Freq: Every day | ORAL | Status: DC
  Administered 2019-10-04 – 2019-10-07 (×4): 40 meq via ORAL
  Filled 2019-10-04 (×4): qty 2

## 2019-10-04 MED ORDER — ACETAMINOPHEN 325 MG PO TABS
650.0000 mg | ORAL_TABLET | Freq: Four times a day (QID) | ORAL | Status: AC
  Filled 2019-10-04: qty 2

## 2019-10-04 MED ORDER — SODIUM CHLORIDE 0.9 % IV SOLN
1.0000 g | INTRAVENOUS | Status: DC
  Administered 2019-10-04 – 2019-10-05 (×2): 1 g via INTRAVENOUS
  Filled 2019-10-04 (×2): qty 1

## 2019-10-04 MED ORDER — LACTULOSE 10 GM/15ML PO SOLN
10.0000 g | Freq: Two times a day (BID) | ORAL | Status: DC | PRN
  Filled 2019-10-04: qty 15

## 2019-10-04 MED ORDER — POLYETHYLENE GLYCOL 3350 17 G PO PACK
17.0000 g | PACK | Freq: Every day | ORAL | Status: DC
  Administered 2019-10-05 – 2019-10-08 (×4): 17 g via ORAL
  Filled 2019-10-04 (×6): qty 1

## 2019-10-04 MED ORDER — HYDROMORPHONE HCL 1 MG/ML IJ SOLN
1.0000 mg | INTRAMUSCULAR | Status: DC | PRN
  Administered 2019-10-04 – 2019-10-05 (×4): 1 mg via INTRAVENOUS
  Filled 2019-10-04 (×5): qty 1

## 2019-10-04 MED ORDER — CINACALCET HCL 30 MG PO TABS
180.0000 mg | ORAL_TABLET | Freq: Every day | ORAL | Status: DC
  Administered 2019-10-04 – 2019-10-09 (×6): 180 mg via ORAL
  Filled 2019-10-04 (×6): qty 6

## 2019-10-04 MED ORDER — VANCOMYCIN VARIABLE DOSE PER UNSTABLE RENAL FUNCTION (PHARMACIST DOSING): Status: DC

## 2019-10-04 MED ORDER — VANCOMYCIN HCL 10 G IV SOLR
2500.0000 mg | Freq: Once | INTRAVENOUS | Status: AC
  Administered 2019-10-04: 2500 mg via INTRAVENOUS
  Filled 2019-10-04: qty 2500

## 2019-10-04 MED ORDER — DELFLEX-LC/1.5% DEXTROSE 344 MOSM/L IP SOLN: INTRAPERITONEAL | Status: DC

## 2019-10-04 MED ORDER — HEPARIN 1000 UNIT/ML FOR PERITONEAL DIALYSIS: 500.0000 [IU] | INTRAMUSCULAR | Status: DC | PRN

## 2019-10-04 MED ORDER — INFLUENZA VAC SPLIT QUAD 0.5 ML IM SUSY
0.5000 mL | PREFILLED_SYRINGE | INTRAMUSCULAR | Status: AC
  Administered 2019-10-06: 0.5 mL via INTRAMUSCULAR
  Filled 2019-10-04: qty 0.5

## 2019-10-04 MED ORDER — HEPARIN SODIUM (PORCINE) 5000 UNIT/ML IJ SOLN
5000.0000 [IU] | Freq: Three times a day (TID) | INTRAMUSCULAR | Status: DC
  Administered 2019-10-04 – 2019-10-10 (×13): 5000 [IU] via SUBCUTANEOUS
  Filled 2019-10-04 (×15): qty 1

## 2019-10-04 MED ORDER — ACETAMINOPHEN 325 MG PO TABS: 650.0000 mg | ORAL_TABLET | Freq: Four times a day (QID) | ORAL | Status: DC | PRN

## 2019-10-04 NOTE — ED Provider Notes (Addendum)
[facility] EMERGENCY DEPARTMENT Provider Note   CSN: [id number] Arrival date & time: 10/03/19  2248     History Chief Complaint  Patient presents with  . Obstructed PD catheter    Ruth Gutierrez is a 54 y.o. female.  The history is provided by the patient.  Illness Location:  PD catheter Quality:  Painful infusions x 2 days then nothing would infuse nor be drawn off in the last 24 hours.  Severity:  Severe Onset quality:  Gradual Duration:  3 days Timing:  Constant Progression:  Worsening Chronicity:  New Context:  PD catheter Relieved by:  Nothing Worsened by:  Attempts to use catheter  Ineffective treatments:  None tried  Associated symptoms: abdominal pain   Associated symptoms: no chest pain, no congestion, no cough, no diarrhea, no ear pain, no fatigue, no fever, no headaches, no loss of consciousness, no myalgias, no nausea, no rash, no rhinorrhea, no shortness of breath, no sore throat, no vomiting and no wheezing   Abdominal pain:    Location:  Generalized   Quality: aching     Severity:  Severe   Onset quality:  Gradual   Duration:  3 days   Timing:  Constant   Progression:  Worsening   Chronicity:  New Risk factors:  Dialysis patient.  Called the nurse and was told to take more fluids and attempt again.       Past Medical History:  Diagnosis Date  . Anemia of chronic disease   . Arthritis   . Deceased-donor kidney transplant    Performed at [facility], [place] 2010.  Initial ESRD due to HTN nephropathy  . Eczema   . ESRD (end stage renal disease) ([place])    s/p transplant creatinine baseline 1.1  M/W/F dialysis  . FUO (fever of unknown origin) 05/17/2015  . GERD (gastroesophageal reflux disease)   . Headache(784.0)   . History of hyperparathyroidism   . Hypertension   . Shortness of breath   . Wears glasses     Patient Active Problem List   Diagnosis Date Noted  . Renal dialysis device, implant, or graft complication  63/84/6659  . ESRD on dialysis ([place])   . HCAP (healthcare-associated pneumonia)   . Sepsis ([place]) 11/12/2015  . Pneumonia 11/12/2015  . FUO (fever of unknown origin) 05/17/2015  . Acute renal failure ([place]) 04/05/2013  . Diarrhea 04/05/2013  . Acute pyelonephritis 02/02/2013  . Viral gastroenteritis 02/02/2013  . Sepsis due to Klebsiella pneumoniae ([place]) 06/02/2011  . Fatigue 06/02/2011  . Tobacco abuse 06/02/2011  . Iron deficiency anemia 04/04/2011  . Hypertension 04/04/2011  . S/P kidney transplant 04/04/2011  . Clotted renal dialysis AV graft ([place]) 04/04/2011  . Fibroids 04/04/2011  . Preventative health care 04/04/2011    Past Surgical History:  Procedure Laterality Date  . A/V FISTULAGRAM Left 02/12/2017   Procedure: A/V Fistulagram;  Surgeon: Algernon Huxley, MD;  Location: [place] [facility];  Service: Cardiovascular;  Laterality: Left;  . A/V FISTULAGRAM Left 12/30/2017   Procedure: A/V FISTULAGRAM;  Surgeon: Algernon Huxley, MD;  Location: [place] [facility];  Service: Cardiovascular;  Laterality: Left;  . A/V SHUNT INTERVENTION N/A 02/12/2017   Procedure: A/V Shunt Intervention;  Surgeon: Algernon Huxley, MD;  Location: [place] [facility];  Service: Cardiovascular;  Laterality: N/A;  . AV FISTULA PLACEMENT    . BASCILIC VEIN TRANSPOSITION Left 10/30/2014   Procedure: LEFT BASCILIC VEIN TRANSPOSITION;  Surgeon: Rosetta Posner, MD;  [facility];  Service: Vascular;  Laterality: Left;  . BASCILIC VEIN TRANSPOSITION Left 01/03/2015   Procedure: LEFT ARM 2ND STAGE BASCILIC VEIN TRANSPOSITION;  Surgeon: Rosetta Posner, MD;  Location: [facility];  Service: Vascular;  Laterality: Left;  . FRACTURE SURGERY     left foot,baby toe nad next toe missing  . INSERTION OF DIALYSIS CATHETER Right 10/30/2014   Procedure: INSERTION OF DIALYSIS CATHETER;  Surgeon: Rosetta Posner, MD;  Location: [place];  Service: Vascular;  Laterality: Right;  . KIDNEY TRANSPLANT  11/2008   [facility])  . WISDOM  TOOTH EXTRACTION       OB History   No obstetric history on file.     Family History  Problem Relation Age of Onset  . Hypertension Mother   . Hypertension Father   . Diabetes Brother   . Deep vein thrombosis Brother   . Hypertension Sister   . Hyperlipidemia Sister   . Kidney disease Brother        on HD  . Hypertension Sister   . Colon cancer Neg Hx   . Esophageal cancer Neg Hx   . Rectal cancer Neg Hx     Social History   Tobacco Use  . Smoking status: Current Every Day Smoker    Packs/day: 0.50    Years: 27.00    Pack years: 13.50    Types: Cigarettes  . Smokeless tobacco: Never Used  . Tobacco comment: 10 cigarettes a day  Substance Use Topics  . Alcohol use: No    Alcohol/week: 0.0 standard drinks    Comment: occasional drinker noted in the past  . Drug use: No    Home Medications Prior to Admission medications   Medication Sig Start Date End Date Taking? Authorizing Provider  amLODipine (NORVASC) 10 MG tablet Take 10 mg by mouth daily.  03/01/11   [provider]  aspirin 81 MG tablet Take 81 mg by mouth daily.     [provider]  calcitRIOL (ROCALTROL) 0.5 MCG capsule Take 1 capsule (0.5 mcg total) by mouth every Monday, Wednesday, and Friday with hemodialysis. Patient not taking: Reported on 03/02/2019 11/16/15   Iline Oven, MD  cinacalcet (SENSIPAR) 90 MG tablet Take 90 mg by mouth daily.  11/26/18   [provider]  sucroferric oxyhydroxide (VELPHORO) 500 MG chewable tablet Chew 500 mg by mouth 3 (three) times daily with meals.  08/27/18   [provider]    Allergies    Penicillin g and Penicillins  Review of Systems   Review of Systems  Constitutional: Negative for fatigue and fever.  HENT: Negative for congestion, ear pain, rhinorrhea and sore throat.   Respiratory: Negative for cough, shortness of breath and wheezing.   Cardiovascular: Negative for chest pain.  Gastrointestinal: Positive for abdominal  pain. Negative for diarrhea, nausea and vomiting.  Musculoskeletal: Negative for myalgias.  Skin: Negative for rash.  Neurological: Negative for loss of consciousness and headaches.  All other systems reviewed and are negative.   Physical Exam Updated Vital Signs BP 112/81   Pulse 100   Temp 98.6 F (37 C)   Resp 17   Ht 5\' 7"  (1.702 m)   Wt 90 kg   LMP 06/02/2015   SpO2 99%   BMI 31.08 kg/m   Physical Exam  ED Results / Procedures / Treatments   Labs (all labs ordered are listed, but only abnormal results are displayed) Results for orders placed or performed during the  hospital encounter of 10/03/19  CBC with Differential/Platelet  Result Value Ref Range   WBC 11.4 (H) 4.0 - 10.5 K/uL   RBC 4.15 3.87 - 5.11 MIL/uL   Hemoglobin 13.6 12.0 - 15.0 g/dL   HCT 40.4 36.0 - 46.0 %   MCV 97.3 80.0 - 100.0 fL   MCH 32.8 26.0 - 34.0 pg   MCHC 33.7 30.0 - 36.0 g/dL   RDW 14.2 11.5 - 15.5 %   Platelets 161 150 - 400 K/uL   nRBC 0.0 0.0 - 0.2 %   Neutrophils Relative % 82 %   Neutro Abs 9.5 (H) 1.7 - 7.7 K/uL   Lymphocytes Relative 9 %   Lymphs Abs 1.0 0.7 - 4.0 K/uL   Monocytes Relative 6 %   Monocytes Absolute 0.7 0.1 - 1.0 K/uL   Eosinophils Relative 2 %   Eosinophils Absolute 0.2 0.0 - 0.5 K/uL   Basophils Relative 0 %   Basophils Absolute 0.0 0.0 - 0.1 K/uL   Immature Granulocytes 1 %   Abs Immature Granulocytes 0.10 (H) 0.00 - 0.07 K/uL  Basic metabolic panel  Result Value Ref Range   Sodium 133 (L) 135 - 145 mmol/L   Potassium 3.0 (L) 3.5 - 5.1 mmol/L   Chloride 91 (L) 98 - 111 mmol/L   CO2 23 22 - 32 mmol/L   Glucose, Bld 86 70 - 99 mg/dL   BUN 79 (H) 6 - 20 mg/dL   Creatinine, Ser 14.50 (H) 0.44 - 1.00 mg/dL   Calcium 9.5 8.9 - 10.3 mg/dL   GFR calc non Af Amer 3 (L) >60 mL/min   GFR calc Af Amer 3 (L) >60 mL/min   Anion gap 19 (H) 5 - 15   No results found.  Radiology No results found.  Procedures Procedures (including critical care time)   Medications Ordered in ED Medications  fentaNYL (SUBLIMAZE) injection 50 mcg (50 mcg Intravenous Given 10/04/19 0542)    ED Course  I have reviewed the triage vital signs and the nursing notes.  Pertinent labs & imaging results that were available during my care of the patient were reviewed by me and considered in my medical decision making (see chart for details).    514 case d/w Dr. Moshe Cipro via phone.  Please obtain Xray, dialysis nurse will come down to attempt to draw off fluid and then a decision will be made based on this evaluation   Final Clinical Impression(s) / ED Diagnoses Signed out to Dr. Wilson Singer pending dialysis nurse evaluation       Lashauna Arpin, MD 10/04/19 [id number]

## 2019-10-04 NOTE — ED Notes (Signed)
Pt to and from x-ray

## 2019-10-04 NOTE — H&P (Signed)
Date: 10/04/2019               Patient Name:  Ruth Gutierrez MRN: [id number]  DOB: 04-Oct-1965 Age / Sex: 54 y.o., female   PCP: Rexene Agent, MD         Medical Service: Internal Medicine Teaching Service         Attending Physician: Dr. Heber [place], Rachel Moulds, DO    First Contact: Ronnald Ramp, MD, Dorian Pod Pager: EJ [phone])  Second Contact: Sharon Seller DO, Jaimie PagerJari Pigg [phone])       After Hours (After 5p/  First Contact Pager: [phone]  weekends / holidays): Second Contact Pager: [phone]   Chief Complaint: abd pain  History of Present Illness: 54 y.o. yo female w/ PMH significant for ESRD on PD since 04/2018. Prior to this, she was on HD since 11/2014 after failed kidney transplant performed at [facility] in April 2010. Reports she was also on HD before her transplant due to hypertensive nephropathy, HTN, fibroids.  Pt reports her PD catheter wouldn't drain for the last 3-4 days and she started to develop abdominal pain and discomfort 3 days ago. She reports some cold sweats at home and feeling overall poorly.  She denies chest pain or shortness of breath she did have some nausea and vomited once Friday and once Saturday.  She reports progressive worsening of her pain which brought her in this morning.  She reports her catheter site looked clean and dry leading up to this. Prior to my arrival she reports dialysis nurse was able to inject some fluid and return fluid from the catheter to collect a sample but that it was painful.     Meds: Current Facility-Administered Medications for the 10/03/19 encounter [facility] Encounter)  Medication  . 0.9 %  sodium chloride infusion   Current Meds  Medication Sig  . calcitRIOL (ROCALTROL) 0.5 MCG capsule Take 1 capsule (0.5 mcg total) by mouth every Monday, Wednesday, and Friday with hemodialysis.  Marland Kitchen cinacalcet (SENSIPAR) 90 MG tablet Take 90 mg by mouth daily.   . diphenhydrAMINE (SOMINEX) 25 MG tablet Take 25 mg by mouth daily as  needed.  Marland Kitchen gentamicin cream (GARAMYCIN) 0.1 % Apply 1 application topically daily.  . sucroferric oxyhydroxide (VELPHORO) 500 MG chewable tablet Chew 500 mg by mouth 3 (three) times daily with meals.      Allergies: Allergies as of 10/03/2019 - Review Complete 10/03/2019  Allergen Reaction Noted  . Penicillin g Itching and Rash 11/12/2015  . Penicillins Itching and Rash 02/02/2013   Past Medical History:  Diagnosis Date  . Anemia of chronic disease   . Arthritis   . Deceased-donor kidney transplant    Performed at [facility], April 2010.  Initial ESRD due to HTN nephropathy  . Eczema   . ESRD (end stage renal disease) ([place])    s/p transplant creatinine baseline 1.1  M/W/F dialysis  . FUO (fever of unknown origin) 05/17/2015  . GERD (gastroesophageal reflux disease)   . Headache(784.0)   . History of hyperparathyroidism   . Hypertension   . Shortness of breath   . Wears glasses     Family History:  Family History  Problem Relation Age of Onset  . Hypertension Mother   . Hypertension Father   . Diabetes Brother   . Deep vein thrombosis Brother   . Hypertension Sister   . Hyperlipidemia Sister   . Kidney disease Brother        on HD  .  Hypertension Sister   . Colon cancer Neg Hx   . Esophageal cancer Neg Hx   . Rectal cancer Neg Hx      Social History:  Social History   Tobacco Use  . Smoking status: Current Every Day Smoker    Packs/day: 0.50    Years: 27.00    Pack years: 13.50    Types: Cigarettes  . Smokeless tobacco: Never Used  . Tobacco comment: 10 cigarettes a day  Substance Use Topics  . Alcohol use: No    Alcohol/week: 0.0 standard drinks    Comment: occasional drinker noted in the past  . Drug use: No     Review of Systems: A complete ROS was negative except as per HPI.   Physical Exam: Blood pressure 114/84, pulse 95, temperature 98.6 F (37 C), resp. rate 17, height 5\' 7"  (1.702 m), weight 90 kg, last menstrual period 06/02/2015, SpO2  98 %. Physical Exam Constitutional:      General: She is not in acute distress.    Appearance: She is not diaphoretic.  HENT:     Head: Normocephalic and atraumatic.  Cardiovascular:     Rate and Rhythm: Normal rate and regular rhythm.     Heart sounds: Normal heart sounds. No murmur. No friction rub. No gallop.   Pulmonary:     Effort: Pulmonary effort is normal. No respiratory distress.     Breath sounds: Normal breath sounds. No wheezing or rales.  Chest:     Chest wall: No tenderness.  Abdominal:     Palpations: Abdomen is soft. There is no mass.     Tenderness: There is abdominal tenderness (diffuse). There is no guarding or rebound.  Musculoskeletal:        General: No tenderness or deformity.  Skin:    General: Skin is warm and dry.  Neurological:     Mental Status: She is alert.  Psychiatric:        Mood and Affect: Mood normal.        Thought Content: Thought content normal.     EKG: personally reviewed my interpretation is none available  Abdominal/Chest xray: personally reviewed my interpretation is no acute cardiopulmonary disease, normal bowel gas pattern without free air, calcified uterine fibroids, coiled PD catheter   Assessment & Plan by Problem: Active Problems:   Peritonitis ([place])  Abd Pain/Peritonitis: s/s consistent with peritonitis, malfunctioning of catheter at home a few days ago. Peritoneal fluid studies obtained 10/04/19 and pt already started on empiric abx  -continue abx -follow fluid studies -pain control and supportive care  ESRD on PD: nephrology consulted, they will initiate PD evening of 3/2  -nephrology following for PD  HTN: currently not on antihypertensive therapies at home  -normotensive continue to monitor  Dispo: Admit patient to Inpatient with expected length of stay greater than 2 midnights.  Signed: Katherine Roan, MD 10/04/2019, 11:36 AM

## 2019-10-04 NOTE — Consult Note (Addendum)
[facility] Renal Consultation Note    Indication for Consultation:  Management of ESRD/hemodialysis, anemia, hypertension/volume, and secondary hyperparathyroidism.  HPI: Ruth Gutierrez is a 54 y.o. female with a PMH including ESRD on PD since 04/2018. Prior to this, she was on HD since 11/2014 after failed kidney transplant performed at [facility] in April 2010. Reports she was also on HD before her transplant due to hypertensive nephropathy. Patient is followed by Dr. Joelyn Oms outpatient. She has a LUE AVF which was created in 2016 by Dr. Donnetta Hutching.   She presented to the ED on 10/03/19 after she was unable to fill or drain her PD catheter. Reports her last PD was 09/30/19. The PD fluid was cloudy and she was then unable to drain her last fill. Her last BM was also on 09/30/19. Reports she had nausea on Friday and vomited twice. Food is starting to become tasteless but denies metallic taste and AMS. She reports diffuse abdominal tenderness since Friday but has received a dose of fentanyl in the ED and pain is improved. Reports mild SOB, cough and orthopnea. Denies peripheral edema. She has a LUE AVF that is pulsatile but has a + thrill and bruit. She denies any previous history of peritonitis and denies complications with HD until this episode. Also had her first dose of the Moderna COVID 19 vaccine yesterday so R arm is slightly sore. She is chronically hypokalemic and supposed to take potassium chloride 40 mEq daily, but reports she only takes it occasionally. Of note, her last iPTH was 1128 and she is on sensipar 180mg  daily and calcitriol 0.5 mcg daily. Also takes velphoro 500mg  3 tabs PO TID.   In the ED, abdominal x-ray showed PD catheter coiled deep within the pelvis, nonobstructive bowel gas pattern. Creatinine  14.5 compared to 8.01 on 09/25/19. BUN 79, K+ 3.0, Ca 9.5, WBC 11.4, Hgb 13.6, SARS Coronavirus 2 negative. BP stable, HR slightly tachycardic 107-127. Blood cultures pending.   Peritoneal cell count and culture ordered.  Past Medical History:  Diagnosis Date  . Anemia of chronic disease   . Arthritis   . Deceased-donor kidney transplant    Performed at [facility], April 2010.  Initial ESRD due to HTN nephropathy  . Eczema   . ESRD (end stage renal disease) ([place])    s/p transplant creatinine baseline 1.1  M/W/F dialysis  . FUO (fever of unknown origin) 05/17/2015  . GERD (gastroesophageal reflux disease)   . Headache(784.0)   . History of hyperparathyroidism   . Hypertension   . Shortness of breath   . Wears glasses    Past Surgical History:  Procedure Laterality Date  . A/V FISTULAGRAM Left 02/12/2017   Procedure: A/V Fistulagram;  Surgeon: Algernon Huxley, MD;  Location: [place] [facility];  Service: Cardiovascular;  Laterality: Left;  . A/V FISTULAGRAM Left 12/30/2017   Procedure: A/V FISTULAGRAM;  Surgeon: Algernon Huxley, MD;  Location: [place] [facility];  Service: Cardiovascular;  Laterality: Left;  . A/V SHUNT INTERVENTION N/A 02/12/2017   Procedure: A/V Shunt Intervention;  Surgeon: Algernon Huxley, MD;  Location: [place] [facility];  Service: Cardiovascular;  Laterality: N/A;  . AV FISTULA PLACEMENT    . BASCILIC VEIN TRANSPOSITION Left 10/30/2014   Procedure: LEFT BASCILIC VEIN TRANSPOSITION;  Surgeon: Rosetta Posner, MD;  Location: [place];  Service: Vascular;  Laterality: Left;  . BASCILIC VEIN TRANSPOSITION Left 01/03/2015   Procedure: LEFT ARM 2ND STAGE BASCILIC VEIN TRANSPOSITION;  Surgeon: Rosetta Posner, MD;  Location: [place];  Service: Vascular;  Laterality: Left;  . FRACTURE SURGERY     left foot,baby toe nad next toe missing  . INSERTION OF DIALYSIS CATHETER Right 10/30/2014   Procedure: INSERTION OF DIALYSIS CATHETER;  Surgeon: Rosetta Posner, MD;  Location: [place];  Service: Vascular;  Laterality: Right;  . KIDNEY TRANSPLANT  11/2008   [facility])  . WISDOM TOOTH EXTRACTION     Family History  Problem Relation Age of Onset  . Hypertension  Mother   . Hypertension Father   . Diabetes Brother   . Deep vein thrombosis Brother   . Hypertension Sister   . Hyperlipidemia Sister   . Kidney disease Brother        on HD  . Hypertension Sister   . Colon cancer Neg Hx   . Esophageal cancer Neg Hx   . Rectal cancer Neg Hx    Social History:  reports that she has been smoking cigarettes. She has a 13.50 pack-year smoking history. She has never used smokeless tobacco. She reports that she does not drink alcohol or use drugs.  ROS: As per HPI otherwise negative.   Physical Exam: Vitals:   10/03/19 2254 10/03/19 2257 10/04/19 0405 10/04/19 0700  BP: 121/83  112/81 123/88  Pulse: (!) 109  100 (!) 127  Resp: 18  17   Temp: 98.1 F (36.7 C)  98.6 F (37 C)   TempSrc: Oral     SpO2: 95%  99% 99%  Weight:  90 kg    Height:  5\' 7"  (1.702 m)       General: Well developed, well nourished female, in no acute distress. Head: Normocephalic, atraumatic, sclera non-icteric, mucus membranes are moist. Neck: Supple without lymphadenopathy/masses. JVD not elevated. Lungs: Clear bilaterally to auscultation without wheezes, rales, or rhonchi. Breathing is unlabored on RA. Heart: Slightly tachycardic with normal S1, S2. No murmurs, rubs, or gallops appreciated. Abdomen: PD catheter LLQ without surrounding erythema/drainage. Abdomen soft, diffusely tender to light palpation. +BS, no gaurding Musculoskeletal:  Strength and tone appear normal for age. Lower extremities: No edema or ischemic changes, no open wounds. Neuro: Alert and oriented X 3. Moves all extremities spontaneously. Psych:  Responds to questions appropriately with a normal affect. Dialysis Access: PD catheter, LUE AVF + thrill/bruit  Allergies  Allergen Reactions  . Penicillin G Itching and Rash  . Penicillins Itching and Rash    Did it involve swelling of the face/tongue/throat, SOB, or low BP?Y Did it involve sudden or severe rash/hives, skin peeling, or any reaction on  the inside of your mouth or nose? Y Did you need to seek medical attention at a hospital or doctor's office? Y When did it last happen?2016 If all above answers are "NO", may proceed with cephalosporin use.   Prior to Admission medications   Medication Sig Start Date End Date Taking? Authorizing Provider  calcitRIOL (ROCALTROL) 0.5 MCG capsule Take 1 capsule (0.5 mcg total) by mouth every Monday, Wednesday, and Friday with hemodialysis. 11/16/15  Yes Iline Oven, MD  cinacalcet (SENSIPAR) 90 MG tablet Take 90 mg by mouth daily.  11/26/18  Yes [provider]  diphenhydrAMINE (SOMINEX) 25 MG tablet Take 25 mg by mouth daily as needed.   Yes [provider]  gentamicin cream (GARAMYCIN) 0.1 % Apply 1 application topically daily. 09/27/19  Yes [provider]  sucroferric oxyhydroxide (VELPHORO) 500 MG chewable tablet Chew 500 mg by mouth 3 (  three) times daily with meals.  08/27/18  Yes [provider]   Current Facility-Administered Medications  Medication Dose Route Frequency Provider Last Rate Last Admin  . 0.9 %  sodium chloride infusion  500 mL Intravenous Once Gatha Mayer, MD      . fentaNYL (SUBLIMAZE) injection 50 mcg  50 mcg Intravenous Once Virgel Manifold, MD      . polyethylene glycol [facility] Floria Raveling) packet 17 g  17 g Oral Daily Palumbo, April, MD       Current Outpatient Medications  Medication Sig Dispense Refill  . calcitRIOL (ROCALTROL) 0.5 MCG capsule Take 1 capsule (0.5 mcg total) by mouth every Monday, Wednesday, and Friday with hemodialysis.    Marland Kitchen cinacalcet (SENSIPAR) 90 MG tablet Take 90 mg by mouth daily.     . diphenhydrAMINE (SOMINEX) 25 MG tablet Take 25 mg by mouth daily as needed.    Marland Kitchen gentamicin cream (GARAMYCIN) 0.1 % Apply 1 application topically daily.    . sucroferric oxyhydroxide (VELPHORO) 500 MG chewable tablet Chew 500 mg by mouth 3 (three) times daily with meals.      Labs: Basic Metabolic  Panel: Recent Labs  Lab 10/04/19 0500  NA 133*  K 3.0*  CL 91*  CO2 23  GLUCOSE 86  BUN 79*  CREATININE 14.50*  CALCIUM 9.5   CBC: Recent Labs  Lab 10/04/19 0500  WBC 11.4*  NEUTROABS 9.5*  HGB 13.6  HCT 40.4  MCV 97.3  PLT 161   Studies/Results: DG Abdomen Acute W/Chest  Result Date: 10/04/2019 CLINICAL DATA:  Abdominal pain EXAM: DG ABDOMEN ACUTE W/ 1V CHEST COMPARISON:  April 05, 2013 FINDINGS: There is no evidence of dilated bowel loops or free intraperitoneal air. A right-sided peritoneal dialysis catheter is coiled within the deep pelvis. Partially calcified uterine fibroids are seen. Heart size and mediastinal contours are within normal limits. Both lungs are clear. IMPRESSION: Nonobstructive bowel gas pattern.  No acute cardiopulmonary disease. Electronically Signed   By: Prudencio Pair M.D.   On: 10/04/2019 06:22    Dialysis Orders: Center: [facility] Followed by Dr. Joelyn Oms. 7X Week, EDW 76kg, Dextrose 1.5; 2.5; 4.25 %, # Exchanges 6, fill vol 3000 mL, Dwell Time 1 hrs 15 min, last fill vol 1500 mL, Day Exchanges 1, daytime fill vol 2000 mL, Day Dwell Time 3 hrs 0 min  Uses 3 exchanges with 1.5% and 1 exchange 2.% dextrose.  Assessment/Plan: 1.  PD catheter malfunction: Unable to fill or drain fluid since 09/30/19 with + cloudy fluid and abdominal pain. CXR unremarkable. High suspicion for peritonitis. Dialysis nurse to attempt to draw fluid for cell count and culture. Will give dose of fortaz (pcn allergy itching only) and start vancomycin after PD fluid collected. Also reports constipation, will start lactulose BID until bowel movement.  Attempt PD again tonight, 3 exchanges with 1.5% and 1 exchange 2.% dextrose. If unable to complete PD will need to convert to HD temporarily. Patient does have an AVF that appears to be functional.  2.  ESRD:  BUN and creatinine elevated compared to baseline but not acutely uremic or volume overloaded. Attempt PD tonight, see  above. 3. Hypokalemia: K+ 3.0. Pt is supposed to be taking KCl daily but reports poor compliance, will restart potassium chloride 57mEq QD and follow labs. 4. Constipation: lactulose ordered as above.  5.  Hypertension/volume:  BP controlled, not on any BP meds as outpatient. Reports mild orthopnea but is comfortable on RA, fluid reduction with PD  tonight as above.  6.  Anemia: Hgb 13.6. Not on outpatient ESA and none indicated at present. 7.  Metabolic bone disease: Last iPTH 1128, on max sensipar and calcitriol 0.60mcg daily however has questionable compliance. Restart meds here. Continue outpatient binder (velphoro).  8.  Nutrition: Liberalize K+ in diet. No albumin reported yet.   Anice Paganini, PA-C 10/04/2019, 7:57 AM  [facility] Pager: [phone]  Nephrology attending:  Patient was seen and examined in ER.  Chart and lab results reviewed.  I agree with the consult note including assessment and plan as outlined above.  54 year old female with ESRD on PD admitted with abdominal pain and problem with PD fluid drainage.  Last dialysis was on Friday and then started noticing abdomen discomfort with fluid filling and then had cloudy fluid.  She has mild leukocytosis and abdomen tenderness.  Also has constipation.  Abdomen x-ray with catheter in place. We will send peritoneal fluid for culture and cell count and then start vancomycin and Fortaz per presumed peritonitis (pt said only itching with [facility]).  We will attempt PD today, lower fill volume to 2 L.  Lactulose for constipation.  Her volume status looks acceptable and currently not uremic therefore no need for urgent hemodialysis.  If needed she has left upper extremity AV fistula functioning. Replete potassium chloride as above.  Continue current medication as ordered.  I have discussed with the patient and she agreed with the plan.  Lawson Radar, MD [facility].

## 2019-10-04 NOTE — ED Notes (Signed)
Admitting Provider at bedside. 

## 2019-10-04 NOTE — Progress Notes (Addendum)
Pharmacy Antibiotic Note  Ruth Gutierrez is a 54 y.o. female admitted on 10/03/2019 with peritonitis.  Pharmacy has been consulted for ceftazidime/vancomycin dosing. ESRD on PD ([facility]). 1st dose of antibiotics are to be given after peritoneal cell count/culture collected per MD request - communicated plan with RN. Patient states she does not make urine.   Plan: Ceftazidime 1g IV q24h Vancomycin 2500mg  IV x 1; Obtain serum level in 3-4 days and re-dose once level <20. This interval will determine the dosing frequency Monitor clinical progress, c/s, Renal plans, abx plan/LOT    Height: 5\' 7"  (170.2 cm) Weight: 198 lb 6.6 oz (90 kg) IBW/kg (Calculated) : 61.6  Temp (24hrs), Avg:98.4 F (36.9 C), Min:98.1 F (36.7 C), Max:98.6 F (37 C)  Recent Labs  Lab 10/04/19 0500  WBC 11.4*  CREATININE 14.50*    Estimated Creatinine Clearance: 5.2 mL/min (A) (by C-G formula based on SCr of 14.5 mg/dL (H)).    Allergies  Allergen Reactions  . Penicillin G Itching and Rash  . Penicillins Itching and Rash    Did it involve swelling of the face/tongue/throat, SOB, or low BP?Y Did it involve sudden or severe rash/hives, skin peeling, or any reaction on the inside of your mouth or nose? Y Did you need to seek medical attention at a hospital or doctor's office? Y When did it last happen?2016 If all above answers are "NO", may proceed with cephalosporin use.    [name], PharmD, BCPS Please check AMION for all [place] contact numbers Clinical Pharmacist 10/04/2019 8:54 AM

## 2019-10-04 NOTE — Plan of Care (Signed)
  Problem: Pain Management: Goal: Pain related to peritonitis will be minimal Outcome: Progressing   Problem: Coping: Goal: Ability to cope with  disease diagnosis and complications will improve Outcome: Progressing

## 2019-10-04 NOTE — ED Notes (Signed)
Pt care endorsed to Michele RN.  

## 2019-10-04 NOTE — ED Notes (Signed)
Dialysis nurse completed with fluid insertion to peritoneal cath.  Will return in 1-2 hours for additional procedure. Abx on hold until after completed.

## 2019-10-04 NOTE — ED Notes (Signed)
Pt alert and oriented c/o increased pain.  MD consulted for additional pain meds.  Pt on the phone and able to update contacts as needed.

## 2019-10-04 NOTE — ED Notes (Signed)
Pt came to the ED per triage complaint. Pt conscious, breathing, and A&Ox4. Pt brought back to [address] via wheelchair. Pt endorses "It's been three days since I have been able to perform my PD and when there is drainage it's white fibrin like". Chest rise and fall equally with non-labored breathing. Lungs clear apex to base. Abd tender to touch in the peri-umbilical and right lower quadrant region. Pt denies chest pain, n/v/d, shortness of breath, and f/c. Pt endorses pain 8 out of 10 pain that's vice-like. PIVC placed on the RAC with a 20G which had positive blood return and flushed without pain or infiltration. Blood collected, labeled, and sent to lab. Bed in lowest position with call light within reach. Pt on continuous blood pressure, pulse ox, and cardiac monitor. Will continue to monitor. Awaiting MD eval. No distress noted.

## 2019-10-05 DIAGNOSIS — K650 Generalized (acute) peritonitis: Secondary | ICD-10-CM | POA: Diagnosis not present

## 2019-10-05 DIAGNOSIS — E876 Hypokalemia: Secondary | ICD-10-CM

## 2019-10-05 DIAGNOSIS — N186 End stage renal disease: Secondary | ICD-10-CM | POA: Diagnosis not present

## 2019-10-05 DIAGNOSIS — Z992 Dependence on renal dialysis: Secondary | ICD-10-CM | POA: Diagnosis not present

## 2019-10-05 DIAGNOSIS — D631 Anemia in chronic kidney disease: Secondary | ICD-10-CM | POA: Diagnosis not present

## 2019-10-05 DIAGNOSIS — R1084 Generalized abdominal pain: Secondary | ICD-10-CM

## 2019-10-05 DIAGNOSIS — Z79899 Other long term (current) drug therapy: Secondary | ICD-10-CM | POA: Diagnosis not present

## 2019-10-05 DIAGNOSIS — K659 Peritonitis, unspecified: Secondary | ICD-10-CM | POA: Diagnosis not present

## 2019-10-05 LAB — CBC
HCT: 45.2 % (ref 36.0–46.0)
Hemoglobin: 15.1 g/dL — ABNORMAL HIGH (ref 12.0–15.0)
MCH: 32.4 pg (ref 26.0–34.0)
MCHC: 33.4 g/dL (ref 30.0–36.0)
MCV: 97 fL (ref 80.0–100.0)
Platelets: 153 10*3/uL (ref 150–400)
RBC: 4.66 MIL/uL (ref 3.87–5.11)
RDW: 14.2 % (ref 11.5–15.5)
WBC: 8.9 10*3/uL (ref 4.0–10.5)
nRBC: 0 % (ref 0.0–0.2)

## 2019-10-05 LAB — RENAL FUNCTION PANEL
Albumin: 2 g/dL — ABNORMAL LOW (ref 3.5–5.0)
Anion gap: 23 — ABNORMAL HIGH (ref 5–15)
BUN: 74 mg/dL — ABNORMAL HIGH (ref 6–20)
CO2: 20 mmol/L — ABNORMAL LOW (ref 22–32)
Calcium: 9.6 mg/dL (ref 8.9–10.3)
Chloride: 90 mmol/L — ABNORMAL LOW (ref 98–111)
Creatinine, Ser: 13.61 mg/dL — ABNORMAL HIGH (ref 0.44–1.00)
GFR calc Af Amer: 3 mL/min — ABNORMAL LOW (ref >60)
GFR calc non Af Amer: 3 mL/min — ABNORMAL LOW (ref >60)
Glucose, Bld: 101 mg/dL — ABNORMAL HIGH (ref 70–99)
Phosphorus: 6.7 mg/dL — ABNORMAL HIGH (ref 2.5–4.6)
Potassium: 3.6 mmol/L (ref 3.5–5.1)
Sodium: 133 mmol/L — ABNORMAL LOW (ref 135–145)

## 2019-10-05 MED ORDER — DELFLEX-LC/2.5% DEXTROSE 394 MOSM/L IP SOLN
Freq: Every day | INTRAVENOUS_CENTRAL | Status: DC
  Filled 2019-10-05 (×2): qty 5000

## 2019-10-05 MED ORDER — DELFLEX-LC/2.5% DEXTROSE 394 MOSM/L IP SOLN
Freq: Every day | INTRAVENOUS_CENTRAL | Status: DC | PRN
  Filled 2019-10-05: qty 5000

## 2019-10-05 MED ORDER — HEPARIN SODIUM (PORCINE) 1000 UNIT/ML IJ SOLN
Freq: Every day | INTRAVENOUS_CENTRAL | Status: DC | PRN
  Filled 2019-10-05: qty 5000

## 2019-10-05 MED ORDER — LACTULOSE 10 GM/15ML PO SOLN
10.0000 g | Freq: Two times a day (BID) | ORAL | Status: DC
  Administered 2019-10-05 – 2019-10-08 (×5): 10 g via ORAL
  Filled 2019-10-05 (×6): qty 15

## 2019-10-05 MED ORDER — SODIUM CHLORIDE 0.9 % IV SOLN
INTRAVENOUS | Status: DC | PRN
  Administered 2019-10-05: 500 mL via INTRAVENOUS

## 2019-10-05 MED ORDER — HYDROCODONE-ACETAMINOPHEN 7.5-325 MG PO TABS
1.0000 | ORAL_TABLET | Freq: Four times a day (QID) | ORAL | Status: DC | PRN
  Administered 2019-10-05 – 2019-10-09 (×7): 1 via ORAL
  Filled 2019-10-05 (×9): qty 1

## 2019-10-05 MED ORDER — OXYCODONE HCL 5 MG PO TABS
5.0000 mg | ORAL_TABLET | Freq: Once | ORAL | Status: AC
  Administered 2019-10-05: 5 mg via ORAL
  Filled 2019-10-05: qty 1

## 2019-10-05 MED ORDER — HYDROMORPHONE HCL 1 MG/ML IJ SOLN
1.0000 mg | Freq: Four times a day (QID) | INTRAMUSCULAR | Status: DC | PRN
  Administered 2019-10-05 – 2019-10-06 (×3): 1 mg via INTRAVENOUS
  Filled 2019-10-05 (×3): qty 1

## 2019-10-05 MED ORDER — DELFLEX-LC/1.5% DEXTROSE 344 MOSM/L IP SOLN: INTRAPERITONEAL | Status: DC

## 2019-10-05 MED ORDER — OXYCODONE HCL 5 MG PO TABS
5.0000 mg | ORAL_TABLET | Freq: Four times a day (QID) | ORAL | Status: DC | PRN
  Administered 2019-10-05: 5 mg via ORAL
  Filled 2019-10-05: qty 1

## 2019-10-05 MED ORDER — HEPARIN SODIUM (PORCINE) 1000 UNIT/ML IJ SOLN
Freq: Every day | INTRAVENOUS_CENTRAL | Status: DC | PRN
  Filled 2019-10-05 (×2): qty 5000

## 2019-10-05 MED ORDER — HEPARIN 1000 UNIT/ML FOR PERITONEAL DIALYSIS
INTRAPERITONEAL | Status: DC | PRN
  Filled 2019-10-05 (×4): qty 5000

## 2019-10-05 MED ORDER — DELFLEX-LC/2.5% DEXTROSE 394 MOSM/L IP SOLN: INTRAPERITONEAL | Status: DC

## 2019-10-05 MED ORDER — GENTAMICIN SULFATE 0.1 % EX CREA
1.0000 "application " | TOPICAL_CREAM | Freq: Every day | CUTANEOUS | Status: DC
  Filled 2019-10-05: qty 15

## 2019-10-05 MED ORDER — DELFLEX-LC/1.5% DEXTROSE 344 MOSM/L IP SOLN
Freq: Every day | INTRAVENOUS_CENTRAL | Status: DC | PRN
  Filled 2019-10-05: qty 5000

## 2019-10-05 MED ORDER — ONDANSETRON 4 MG PO TBDP
4.0000 mg | ORAL_TABLET | Freq: Once | ORAL | Status: AC
  Administered 2019-10-05: 4 mg via ORAL
  Filled 2019-10-05: qty 1

## 2019-10-05 MED ORDER — HEPARIN 1000 UNIT/ML FOR PERITONEAL DIALYSIS: 500.0000 [IU] | INTRAMUSCULAR | Status: DC | PRN

## 2019-10-05 MED ORDER — DELFLEX-LC/1.5% DEXTROSE 344 MOSM/L IP SOLN
Freq: Every day | INTRAVENOUS_CENTRAL | Status: DC
  Filled 2019-10-05 (×2): qty 5000

## 2019-10-05 NOTE — Progress Notes (Signed)
   Subjective:   Pt seen at the bedside this morning while being taken off PD. Nursing at the bedside denied any difficulty flushing the catheter or with the session overnight. Pt endorses continued generalized abdominal pain. Received dilaudid 1mg  IV x 3 overnight.   Objective:  Vital signs in last 24 hours: Vitals:   10/05/19 0603 10/05/19 0649 10/05/19 0730 10/05/19 0900  BP: (!) 166/110 (!) 152/104 (!) 175/109 (!) 148/98  Pulse: (!) 120 (!) 124 (!) 126 (!) 119  Resp: 20  18 17   Temp: 99.1 F (37.3 C)  99 F (37.2 C) 100 F (37.8 C)  TempSrc: Oral  Oral Oral  SpO2: 96%  96% 99%  Weight:   72.5 kg   Height:       Physical Exam Vitals and nursing note reviewed.  Constitutional:      General: She is not in acute distress.    Appearance: She is not ill-appearing.     Comments: Pt up and awake in the bed, resting comfortably.  Cardiovascular:     Rate and Rhythm: Regular rhythm. Tachycardia present.     Heart sounds: Normal heart sounds.  Pulmonary:     Effort: Pulmonary effort is normal.  Abdominal:     General: Abdomen is flat. Bowel sounds are normal. There is no distension.     Palpations: Abdomen is soft.     Tenderness: There is abdominal tenderness (generalized tenderness to palpation, worst in the suprapubic/center area).     Comments: No evidence of an acute abdomen.  Skin:    General: Skin is warm and dry.     Comments: PD catheter insertion site clean, without surrounding erythema or drainage.  Neurological:     Mental Status: She is alert.    Assessment/Plan:  Active Problems:   ESRD on peritoneal dialysis ([place])   Peritonitis ([place])   PD catheter dysfunction ([place])  Ms. Klatt is a 54 year old F with significant PMH of ESRD on PD, who presented for abdominal pain and difficulty flushing the PD catheter at home concerning for peritonitis.  Abdominal Pain Peritonitis Pt with cloudy peritoneal fluid and persistent abdominal pain. Leukocytosis improved  this morning 8.9 << 11.4. Nephrology consulted. - fluid culture with abundant WBC and few gram negative rods - follow-up culture results - continue antibiotics with ceftazidime and vancomycin - transition pain medication to Oxy IR 5mg  q6h PRN - PD successful overnight per nursing, no difficulty flushing catheter  ESRD on PD Nephrology consulted, appreciate recommendations - attempt PD  - no indication for urgent HD, though pt with a functional LUE AVF if necessary Hypokalemia - K 3.0 on admission - restart home supplementation with PO KCl 31mEq daily - K 3.6 today  HTN: currently not on antihypertensive therapies at home Hypertensive and tachycardic this morning, HR 120's and SBP 150-170's. Pt stating she was in a lot of painovernight and had just received dilaudid 1mg  IV this morning. - continue to monitor - pain control as above  Prior to Admission Living Arrangement: home Anticipated Discharge Location: home Barriers to Discharge: coordination of outpatient antibiotics  Dispo: Anticipated discharge in approximately 0-1 day(s). Will defer to nephrology for discharge later today vs tomorrow based on antibiotic recommendations and follow-up on fluid analysis.  Ladona Horns, MD 10/05/2019, 9:28 AM Pager: [phone]

## 2019-10-05 NOTE — Plan of Care (Signed)
  Problem: Pain Management: Goal: Pain related to peritonitis will be minimal Outcome: Progressing   Problem: Coping: Goal: Ability to cope with  disease diagnosis and complications will improve Outcome: Progressing

## 2019-10-05 NOTE — Progress Notes (Addendum)
[facility] Progress Note   Subjective:   Patient seen in room. Tolerated PD last night 4 exchanged with cloudy, high cell count, culture pending. Will change to intraperitoneal antibiotics with last dwell tomorrow. Patient continues to report abdominal pain, decreased appetite. Denies SOB, CP, palpitations and dizziness. No BM yet but looks like she has not had lactulose- will schedule instead of PRN.   Objective Vitals:   10/05/19 0603 10/05/19 0649 10/05/19 0730 10/05/19 0900  BP: (!) 166/110 (!) 152/104 (!) 175/109 (!) 148/98  Pulse: (!) 120 (!) 124 (!) 126 (!) 119  Resp: 20  18 17   Temp: 99.1 F (37.3 C)  99 F (37.2 C) 100 F (37.8 C)  TempSrc: Oral  Oral Oral  SpO2: 96%  96% 99%  Weight:   72.5 kg   Height:       Physical Exam General: Well developed, ill appearing female Heart: Slightly tachycardic rate, regular rhythm. No murmurs, rubs or gallops Lungs: CTA bilaterally without wheezing, rhonchi or rales Abdomen: Soft, non-distended, diffusely tender to palpation. +BS, PD cath without erythema/drainage Extremities:  No edema b/l lower extremities Dialysis Access:  PD cath, LUE AVF + thrill/bruit  Additional Objective Labs: Basic Metabolic Panel: Recent Labs  Lab 10/04/19 0500 10/05/19 0536  NA 133* 133*  K 3.0* 3.6  CL 91* 90*  CO2 23 20*  GLUCOSE 86 101*  BUN 79* 74*  CREATININE 14.50* 13.61*  CALCIUM 9.5 9.6  PHOS  --  6.7*   Liver Function Tests: Recent Labs  Lab 10/05/19 0536  ALBUMIN 2.0*   CBC: Recent Labs  Lab 10/04/19 0500 10/05/19 0536  WBC 11.4* 8.9  NEUTROABS 9.5*  --   HGB 13.6 15.1*  HCT 40.4 45.2  MCV 97.3 97.0  PLT 161 153   Blood Culture    Component Value Date/Time   SDES PERITONEAL DIALYSIS 10/04/2019 1100   SPECREQUEST NONE 10/04/2019 1100   CULT FEW GRAM NEGATIVE RODS 10/04/2019 1100   REPTSTATUS PENDING 10/04/2019 1100     Studies/Results: DG Abdomen Acute W/Chest  Result Date: 10/04/2019 CLINICAL  DATA:  Abdominal pain EXAM: DG ABDOMEN ACUTE W/ 1V CHEST COMPARISON:  April 05, 2013 FINDINGS: There is no evidence of dilated bowel loops or free intraperitoneal air. A right-sided peritoneal dialysis catheter is coiled within the deep pelvis. Partially calcified uterine fibroids are seen. Heart size and mediastinal contours are within normal limits. Both lungs are clear. IMPRESSION: Nonobstructive bowel gas pattern.  No acute cardiopulmonary disease. Electronically Signed   By: Prudencio Pair M.D.   On: 10/04/2019 06:22   Medications: . sodium chloride 500 mL (10/05/19 0846)  . dianeal solution for CAPD/CCPD with additives    . dianeal solution for CAPD/CCPD with additives    . dianeal solution for CAPD/CCPD with additives    . dianeal solution for CAPD/CCPD with additives    . dianeal solution for CAPD/CCPD with additives    . dialysis solution 1.5% low-MG/low-CA    . dialysis solution 2.5% low-MG/low-CA     . acetaminophen  650 mg Oral Q6H  . cinacalcet  180 mg Oral Q supper  . gentamicin cream  1 application Topical Daily  . heparin  5,000 Units Subcutaneous Q8H  . influenza vac split quadrivalent PF  0.5 mL Intramuscular Tomorrow-1000  . lactulose  10 g Oral BID  . polyethylene glycol  17 g Oral Daily  . potassium chloride  40 mEq Oral Daily  . sucroferric oxyhydroxide  1,500 mg Oral  TID WC  . vancomycin variable dose per unstable renal function (pharmacist dosing)   Does not apply See admin instructions    Dialysis Orders: Center: [facility] Followed by Dr. Joelyn Oms. 7X Week, EDW 76kg, Dextrose 1.5; 2.5; 4.25 %, # Exchanges 6, fill vol 3000 mL, Dwell Time 1 hrs 15 min, last fill vol 1500 mL, Day Exchanges 1, daytime fill vol 2000 mL, Day Dwell Time 3 hrs 0 min  Uses 3 exchanges with 1.5% and 1 exchange 2.% dextrose  Assessment/Plan: 1. Peritonitis: Unable to fill or drain fluid since 09/30/19 with + cloudy fluid and abdominal pain. CXR unremarkable. High cell count in peritoneal fluid.  RN was able to complete PD with 4 exchanges last night. Given IV vanc and fortaz yesterday, change to peritoneal tonight. Dialysis nurse to attempt to draw fluid for cell count and culture. Repeat cell count in the AM. 2.  ESRD:  BUN and creatinine elevated compared to baseline but not acutely uremic or volume overloaded, minimal improvement after PD. Increase to 5 exchanges, 3 with 1.5% and 2 with 2.5%. last dwell with peritoneal antibiotics. Does have AVF for back up if HD is needed.  3. Hypokalemia: K+ 3.0 on presentation. Pt is supposed to be taking KCl daily but reports poor compliance, improved to 3.6 today.  Continue potassium chloride 40 mEq daily 4. Constipation: No BM yet, scheduled lactulose BID 5.  Hypertension/volume:  BP slightly elevated today, SOB improved despite no UF with PD overnight. Increasing exchanges as above. 6.  Anemia: Hgb 15.1. Not on outpatient ESA and none indicated at present. 7.  Metabolic bone disease: Last iPTH 1128, on max sensipar and calcitriol 0.51mcg daily however has questionable compliance. Calcium controlled. Phos 6.7. Continue outpatient binder (velphoro).   Anice Paganini, PA-C 10/05/2019, 10:20 AM  [facility] Pager: [phone]  Nephrology attending: Patient was seen and examined at bedside.  Chart, lab results reviewed.  I agree with assessment and plan as outlined above.  54 year old female with PD catheter associated peritonitis.  Cell count reviewed, pending culture results.  Patient received 2.5 g of vancomycin loading and Fortaz yesterday.  We did for exchanges last night, draining fairly well with no UF.  It was cloudy fluid.  We will do intraperitoneal Fortaz and holding IV vancomycin today as patient got large dose yesterday.  I have discussed with the pharmacist.  We will increase the exchange to 5 tonight and repeat cell count tomorrow morning.  Katheran James, MD [facility].

## 2019-10-05 NOTE — Progress Notes (Signed)
Pharmacy Antibiotic Note  Ruth Gutierrez is a 54 y.o. female admitted on 10/03/2019 with peritonitis.  Pharmacy has been consulted for ceftazidime/vancomycin dosing. ESRD on PD (CCPD). Patient states she does not make urine.  Received a call from Nephrologist, Dr. Carolin Sicks, who wants to convert patient to IP dosing. Patient is a [facility] patient, but MD wants continuous dosing for both vancomycin and ceftazidime. Patient already received a load of vancomycin yesterday. D/W Dr. Carolin Sicks who agreed to continue IV Vancomycin until serum vanc level <20 at which time we can convert to IP dosing. Will convert to continuous IP dosing with next [facility] treatment. Patient will receive 125mg /L of ceftazidime in both the 1.5% and 2.5% bags tonight and nightly.    Plan: Ceftazidime 125mg /L IP continuously with nightly CCPD treatment.  Vancomycin: Obtain serum level in AM and convert to 25mg /L continuous IP once vanco level <20 Monitor clinical progress, c/s, Renal plans, abx plan/LOT    Height: 5\' 7"  (170.2 cm) Weight: 160 lb 7.9 oz (72.8 kg) IBW/kg (Calculated) : 61.6  Temp (24hrs), Avg:99.5 F (37.5 C), Min:99.1 F (37.3 C), Max:99.8 F (37.7 C)  Recent Labs  Lab 10/04/19 0500 10/05/19 0536  WBC 11.4* 8.9  CREATININE 14.50* 13.61*    Estimated Creatinine Clearance: 4.7 mL/min (A) (by C-G formula based on SCr of 13.61 mg/dL (H)).    Allergies  Allergen Reactions  . Penicillin G Itching and Rash  . Penicillins Itching and Rash    Did it involve swelling of the face/tongue/throat, SOB, or low BP?Y Did it involve sudden or severe rash/hives, skin peeling, or any reaction on the inside of your mouth or nose? Y Did you need to seek medical attention at a hospital or doctor's office? Y When did it last happen?2016 If all above answers are "NO", may proceed with cephalosporin use.    Else Habermann A. Levada Dy, PharmD, BCPS, FNKF Clinical Pharmacist [place] Please utilize Amion for appropriate  phone number to reach the unit pharmacist ([place])   10/05/2019 8:56 AM

## 2019-10-06 DIAGNOSIS — R109 Unspecified abdominal pain: Secondary | ICD-10-CM

## 2019-10-06 LAB — CBC
HCT: 42.3 % (ref 36.0–46.0)
Hemoglobin: 14.5 g/dL (ref 12.0–15.0)
MCH: 33 pg (ref 26.0–34.0)
MCHC: 34.3 g/dL (ref 30.0–36.0)
MCV: 96.1 fL (ref 80.0–100.0)
Platelets: 158 10*3/uL (ref 150–400)
RBC: 4.4 MIL/uL (ref 3.87–5.11)
RDW: 13.8 % (ref 11.5–15.5)
WBC: 9.2 10*3/uL (ref 4.0–10.5)
nRBC: 0 % (ref 0.0–0.2)

## 2019-10-06 LAB — RENAL FUNCTION PANEL
Albumin: 2 g/dL — ABNORMAL LOW (ref 3.5–5.0)
Anion gap: 19 — ABNORMAL HIGH (ref 5–15)
BUN: 67 mg/dL — ABNORMAL HIGH (ref 6–20)
CO2: 25 mmol/L (ref 22–32)
Calcium: 9 mg/dL (ref 8.9–10.3)
Chloride: 92 mmol/L — ABNORMAL LOW (ref 98–111)
Creatinine, Ser: 13.07 mg/dL — ABNORMAL HIGH (ref 0.44–1.00)
GFR calc Af Amer: 3 mL/min — ABNORMAL LOW (ref >60)
GFR calc non Af Amer: 3 mL/min — ABNORMAL LOW (ref >60)
Glucose, Bld: 114 mg/dL — ABNORMAL HIGH (ref 70–99)
Phosphorus: 6 mg/dL — ABNORMAL HIGH (ref 2.5–4.6)
Potassium: 3.5 mmol/L (ref 3.5–5.1)
Sodium: 136 mmol/L (ref 135–145)

## 2019-10-06 LAB — BODY FLUID CULTURE

## 2019-10-06 LAB — VANCOMYCIN, RANDOM: Vancomycin Rm: 37

## 2019-10-06 LAB — HIV ANTIBODY (ROUTINE TESTING W REFLEX): HIV Screen 4th Generation wRfx: NONREACTIVE

## 2019-10-06 LAB — PATHOLOGIST SMEAR REVIEW

## 2019-10-06 MED ORDER — SODIUM CHLORIDE 0.9 % IV SOLN
500.0000 mg | INTRAVENOUS | Status: DC
  Administered 2019-10-06 – 2019-10-10 (×5): 500 mg via INTRAVENOUS
  Filled 2019-10-06 (×3): qty 0.5
  Filled 2019-10-06: qty 500
  Filled 2019-10-06: qty 0.5

## 2019-10-06 MED ORDER — GENTAMICIN SULFATE 0.1 % EX CREA
1.0000 "application " | TOPICAL_CREAM | Freq: Every day | CUTANEOUS | Status: DC
  Administered 2019-10-06 – 2019-10-08 (×3): 1 via TOPICAL
  Filled 2019-10-06: qty 15

## 2019-10-06 MED ORDER — PALONOSETRON HCL INJECTION 0.25 MG/5ML: 0.2500 mg | Freq: Once | INTRAVENOUS | Status: DC

## 2019-10-06 MED ORDER — DELFLEX-LC/2.5% DEXTROSE 394 MOSM/L IP SOLN
INTRAPERITONEAL | Status: DC
  Administered 2019-10-06 – 2019-10-07 (×2): 5000 mL via INTRAPERITONEAL

## 2019-10-06 MED ORDER — HEPARIN 1000 UNIT/ML FOR PERITONEAL DIALYSIS: 500.0000 [IU] | INTRAMUSCULAR | Status: DC | PRN

## 2019-10-06 MED ORDER — PROCHLORPERAZINE EDISYLATE 10 MG/2ML IJ SOLN
5.0000 mg | Freq: Four times a day (QID) | INTRAMUSCULAR | Status: AC | PRN
  Administered 2019-10-06 – 2019-10-07 (×2): 5 mg via INTRAVENOUS
  Filled 2019-10-06 (×3): qty 2

## 2019-10-06 MED ORDER — DELFLEX-LC/1.5% DEXTROSE 344 MOSM/L IP SOLN
INTRAPERITONEAL | Status: DC
  Administered 2019-10-06 – 2019-10-07 (×2): 5000 mL via INTRAPERITONEAL

## 2019-10-06 NOTE — Plan of Care (Signed)
  Problem: Education: Goal: Knowledge of treatment and prevention of peritonitis will improve Outcome: Progressing

## 2019-10-06 NOTE — Progress Notes (Signed)
Pharmacy student rounding with IMTS/B1 service. Per Pharmacist antibiotic note for today, discontinue ceftazidime IP and initiate meropenem 500mg  IV q24hr.   Review of their note indicates the following : Ceftazidime is not adequate coverage for acinetobacter. Would suggest change from IP ceftaz/vancomcyin to IV meropenem IV (paitent has PCN allergy).  Addressing your question regarding ceftazidime administration; IP administration is preferred to IV administration for peritonitis treatment in peritoneal dialysis patients (lexicomp).    Will discuss pharmacy changes with IM team. Loa Socks, PharmD Candidate

## 2019-10-06 NOTE — Progress Notes (Addendum)
Pharmacy Antibiotic Note  Ruth Gutierrez is a 54 y.o. female admitted on 10/03/2019 with peritonitis.  Pharmacy has been consulted for ceftazidime/vancomycin dosing. ESRD on PD (CCPD). Patient states she does not make urine.  Patient with PD cell count of 2230. PD culture growing Acinetobacter with a negative fungal culture at this point. Vancomycin level 37 this morning which is an adequate vancomycin level.  Ceftazidime may not be adequate coverage for acinetobacter. Would suggest change from IP ceftaz/vancomcyin to IV meropenem IV (paitent has PCN allergy)    Plan: Discontinue ceftazidime IP Meropenem 500mg  IV q24h  D/c vancomycin IP Monitor clinical progress, c/s, Renal plans, abx plan/LOT    Height: 5\' 7"  (170.2 cm) Weight: 159 lb 13.3 oz (72.5 kg) IBW/kg (Calculated) : 61.6  Temp (24hrs), Avg:98.7 F (37.1 C), Min:98.3 F (36.8 C), Max:99.8 F (37.7 C)  Recent Labs  Lab 10/04/19 0500 10/05/19 0536 10/06/19 0338  WBC 11.4* 8.9 9.2  CREATININE 14.50* 13.61* 13.07*  VANCORANDOM  --   --  37    Estimated Creatinine Clearance: 4.9 mL/min (A) (by C-G formula based on SCr of 13.07 mg/dL (H)).    Allergies  Allergen Reactions  . Penicillin G Itching and Rash  . Penicillins Itching and Rash    Did it involve swelling of the face/tongue/throat, SOB, or low BP?Y Did it involve sudden or severe rash/hives, skin peeling, or any reaction on the inside of your mouth or nose? Y Did you need to seek medical attention at a hospital or doctor's office? Y When did it last happen?2016 If all above answers are "NO", may proceed with cephalosporin use.    Filmore Molyneux A. Levada Dy, PharmD, BCPS, FNKF Clinical Pharmacist [place] Please utilize Amion for appropriate phone number to reach the unit pharmacist ([place])   10/06/2019 9:01 AM

## 2019-10-06 NOTE — Progress Notes (Signed)
[facility] Progress Note   Subjective:   Seen in room. Tolerated PD overnight with minimal UF but reports she had some cramping. Small BM yesterday. Abdominal pain improving. PD culture growing acinetobacter. Pharmacy recommending d/c IP antibiotics and starting meropenem IV.   Objective Vitals:   10/05/19 1734 10/05/19 1753 10/05/19 2021 10/06/19 0456  BP: 127/87 (!) 128/96 127/89 (!) 150/91  Pulse: (!) 104 (!) 102 (!) 101 97  Resp: 18 18 18 18   Temp: 98.7 F (37.1 C) 98.7 F (37.1 C) 98.3 F (36.8 C) 98.3 F (36.8 C)  TempSrc: Oral Oral    SpO2:   95% 96%  Weight:      Height:       Physical Exam General: Well developed, well nourished female in NAD Heart: Slightly tachycardic rate, regular rhythm. No murmurs, rubs or gallops Lungs: CTA bilaterally without wheezing, rhonchi or rales Abdomen: Soft, non-distended, mildly tender to palpation. +BS, PD cath without erythema/drainage Extremities:  No edema b/l lower extremities Dialysis Access:  PD cath, LUE AVF + thrill/bruit  Additional Objective Labs: Basic Metabolic Panel: Recent Labs  Lab 10/04/19 0500 10/05/19 0536 10/06/19 0338  NA 133* 133* 136  K 3.0* 3.6 3.5  CL 91* 90* 92*  CO2 23 20* 25  GLUCOSE 86 101* 114*  BUN 79* 74* 67*  CREATININE 14.50* 13.61* 13.07*  CALCIUM 9.5 9.6 9.0  PHOS  --  6.7* 6.0*   Liver Function Tests: Recent Labs  Lab 10/05/19 0536 10/06/19 0338  ALBUMIN 2.0* 2.0*   CBC: Recent Labs  Lab 10/04/19 0500 10/05/19 0536 10/06/19 0338  WBC 11.4* 8.9 9.2  NEUTROABS 9.5*  --   --   HGB 13.6 15.1* 14.5  HCT 40.4 45.2 42.3  MCV 97.3 97.0 96.1  PLT 161 153 158   Blood Culture    Component Value Date/Time   SDES PERITONEAL DIALYSIS 10/04/2019 1100   SDES PERITONEAL DIALYSIS 10/04/2019 1100   SPECREQUEST NONE 10/04/2019 1100   SPECREQUEST NONE 10/04/2019 1100   CULT FEW ACINETOBACTER CALCOACETICUS/BAUMANNII COMPLEX 10/04/2019 1100   CULT  10/04/2019 1100   NO FUNGUS ISOLATED AFTER 1 DAY Performed at [facility], [place] [address], [place], [place] [address]    REPTSTATUS 10/06/2019 FINAL 10/04/2019 1100   REPTSTATUS PENDING 10/04/2019 1100    Medications: . sodium chloride 500 mL (10/05/19 0846)  . dialysis solution 1.5% low-MG/low-CA    . dialysis solution 2.5% low-MG/low-CA    . meropenem (MERREM) IV     . cinacalcet  180 mg Oral Q supper  . gentamicin cream  1 application Topical Daily  . heparin  5,000 Units Subcutaneous Q8H  . lactulose  10 g Oral BID  . polyethylene glycol  17 g Oral Daily  . potassium chloride  40 mEq Oral Daily  . sucroferric oxyhydroxide  1,500 mg Oral TID WC  . vancomycin variable dose per unstable renal function (pharmacist dosing)   Does not apply See admin instructions    Dialysis Orders: Center:[facility] Followed by Dr. Joelyn Oms. 7X Week, EDW 76kg,Dextrose 1.5; 2.5; 4.25 %, # Exchanges 6, fill vol 3000 mL, Dwell Time 1 hrs 15 min, last fill vol 1500 mL, Day Exchanges 1, daytime fill vol 2000 mL, Day Dwell Time 3 hrs 0 min Uses 3 exchanges with 1.5% and 1 exchange 2.% dextrose  Assessment/Plan: 1. Peritonitis: Unable to fill or drain fluid since 09/30/19 with + cloudy fluid and abdominal pain. CXR unremarkable. High cell count in peritoneal fluid.  Fortunately have been able to continue PD here. Given IV vanc and fortaz> IP antibiotics. Peritoneal culture growing acinetobacter. Pharmacy recommending d/c IP antibiotics and starting meropenem IV.  Cell count not obtained today, will repeat cell count in the AM. 2. ESRD:BUN and creatinine elevated compared to baseline but not acutely uremic or volume overloaded, minimal improvement after PD.  Continue 5 exchanges, 3 with 1.5% and 2 with 2.5% with heparinized bags. Does have AVF for back up if HD is needed.  3. Hypokalemia:K+ 3.0 on presentation. Pt is supposed to be taking KCl daily but reports poor compliance, improved to 3.5 today.  Continue potassium  chloride 40 mEq daily 4. Constipation: Ongoing but improving, continue scheduled lactulose BID 5. Hypertension/volume:BP slightly elevated today, SOB improved. 6. Anemia:Hgb 15.1. Not on outpatient ESA and none indicated at present. 7. Metabolic bone disease:Last iPTH 1128, on max sensipar and calcitriol 0.75mcg daily however has questionable compliance. Corrected calcium high, hold calcitriol for now. Continue sensipar. Phos 6.0. Continue outpatient binder (velphoro).  Anice Paganini, PA-C 10/06/2019, 9:22 AM  [facility] Pager: [phone]

## 2019-10-06 NOTE — Progress Notes (Signed)
   Subjective: Pt seen at the bedside this morning. Endorsing minimal improvement in her pain, but remains nauseated. Pt had an episode of emesis in the room. Denies any acute concerns at that. Made nursing aware to administer anti-emetic.   Objective:  Vital signs in last 24 hours: Vitals:   10/05/19 1734 10/05/19 1753 10/05/19 2021 10/06/19 0456  BP: 127/87 (!) 128/96 127/89 (!) 150/91  Pulse: (!) 104 (!) 102 (!) 101 97  Resp: 18 18 18 18   Temp: 98.7 F (37.1 C) 98.7 F (37.1 C) 98.3 F (36.8 C) 98.3 F (36.8 C)  TempSrc: Oral Oral    SpO2:   95% 96%  Weight:      Height:       Physical Exam Vitals and nursing note reviewed.  Constitutional:      General: She is not in acute distress.    Appearance: She is ill-appearing.     Comments: Pt appears uncomfortable in the bed, nauseated and vomiting.   Abdominal:     General: Abdomen is flat. There is no distension.     Palpations: Abdomen is soft.     Tenderness: There is abdominal tenderness (mild tenderness around central abdomen).  Skin:    General: Skin is warm and dry.     Comments: PD catheter site, clean and dry without drainage or erythema.   Neurological:     Mental Status: She is alert.    Assessment/Plan:  Active Problems:   ESRD on peritoneal dialysis ([place])   Peritonitis ([place])   PD catheter dysfunction ([place])  Ruth Gutierrez is a 54 year old F with significant PMH of ESRD on PD, who presented for abdominal pain and difficulty flushing the PD catheter at home concerning for peritonitis.  Abdominal Pain Peritonitis Having persistent abdominal pain, nausea, and vomiting. Leukocytosis resolved, afebrile.  Peritoneal cell count from 3/2 elevated with neutrophilic predominance, culture growing few acinetobacter calcoaceticus/baumannii. Nephrology consulted. - repeat peritoneal cell count today and PD nurse to collect daily - acinetobacter sensitive to ceftazidime, though with high MIC - switch antibiotics to  IV meropenem 500mg  IV daily - random vanc level this morning in therapeutic range - pain control with Norco 7.5-325mg  PO q6h PRN and dilaudid 1mg  IV for breakthrough q6h PRN - compazine PRN for nausea, can consider switching anti-emetics if not effective for pt - drinking miralax this morning and took lactulose for constipation  ESRD on PD Nephrology consulted, appreciate recommendations - will continue PD overnight - no indication for additional HD at this time Hypokalemia - resolved - supplementation with PO KCl 86mEq daily  TFT:DDUKGURKY not on antihypertensive therapies at home Hypertension and tachycardic improved this morning with HR in the low 100's and SBP 120-150s.  - will monitor - could to optimize pain control  Prior to Admission Living Arrangement: home Anticipated Discharge Location: home Barriers to Discharge: clinical improvement and outpatient antibiotic regimen Dispo: Anticipated discharge in approximately 1-2 day(s).   Ladona Horns, MD 10/06/2019, 6:55 AM Pager: [phone]

## 2019-10-07 DIAGNOSIS — D631 Anemia in chronic kidney disease: Secondary | ICD-10-CM | POA: Diagnosis not present

## 2019-10-07 DIAGNOSIS — K650 Generalized (acute) peritonitis: Secondary | ICD-10-CM | POA: Diagnosis not present

## 2019-10-07 DIAGNOSIS — Z79899 Other long term (current) drug therapy: Secondary | ICD-10-CM | POA: Diagnosis not present

## 2019-10-07 DIAGNOSIS — N186 End stage renal disease: Secondary | ICD-10-CM | POA: Diagnosis not present

## 2019-10-07 DIAGNOSIS — K658 Other peritonitis: Secondary | ICD-10-CM

## 2019-10-07 DIAGNOSIS — K659 Peritonitis, unspecified: Secondary | ICD-10-CM | POA: Diagnosis not present

## 2019-10-07 DIAGNOSIS — Z992 Dependence on renal dialysis: Secondary | ICD-10-CM | POA: Diagnosis not present

## 2019-10-07 LAB — RENAL FUNCTION PANEL
Albumin: 1.9 g/dL — ABNORMAL LOW (ref 3.5–5.0)
Anion gap: 18 — ABNORMAL HIGH (ref 5–15)
BUN: 62 mg/dL — ABNORMAL HIGH (ref 6–20)
CO2: 28 mmol/L (ref 22–32)
Calcium: 7.9 mg/dL — ABNORMAL LOW (ref 8.9–10.3)
Chloride: 91 mmol/L — ABNORMAL LOW (ref 98–111)
Creatinine, Ser: 12.88 mg/dL — ABNORMAL HIGH (ref 0.44–1.00)
GFR calc Af Amer: 3 mL/min — ABNORMAL LOW (ref >60)
GFR calc non Af Amer: 3 mL/min — ABNORMAL LOW (ref >60)
Glucose, Bld: 88 mg/dL (ref 70–99)
Phosphorus: 4.7 mg/dL — ABNORMAL HIGH (ref 2.5–4.6)
Potassium: 3.2 mmol/L — ABNORMAL LOW (ref 3.5–5.1)
Sodium: 137 mmol/L (ref 135–145)

## 2019-10-07 LAB — CBC
HCT: 39 % (ref 36.0–46.0)
Hemoglobin: 13.4 g/dL (ref 12.0–15.0)
MCH: 32.8 pg (ref 26.0–34.0)
MCHC: 34.4 g/dL (ref 30.0–36.0)
MCV: 95.6 fL (ref 80.0–100.0)
Platelets: 175 10*3/uL (ref 150–400)
RBC: 4.08 MIL/uL (ref 3.87–5.11)
RDW: 13.8 % (ref 11.5–15.5)
WBC: 10 10*3/uL (ref 4.0–10.5)
nRBC: 0 % (ref 0.0–0.2)

## 2019-10-07 LAB — BODY FLUID CELL COUNT WITH DIFFERENTIAL
Eos, Fluid: 2 %
Lymphs, Fluid: 14 %
Monocyte-Macrophage-Serous Fluid: 15 % — ABNORMAL LOW (ref 50–90)
Neutrophil Count, Fluid: 69 % — ABNORMAL HIGH (ref 0–25)
Total Nucleated Cell Count, Fluid: 325 cu mm (ref 0–1000)

## 2019-10-07 MED ORDER — HYDROMORPHONE HCL 1 MG/ML IJ SOLN
0.5000 mg | Freq: Four times a day (QID) | INTRAMUSCULAR | Status: DC | PRN
  Administered 2019-10-07 – 2019-10-08 (×2): 0.5 mg via INTRAVENOUS
  Filled 2019-10-07 (×2): qty 1

## 2019-10-07 MED ORDER — POTASSIUM CHLORIDE CRYS ER 20 MEQ PO TBCR
40.0000 meq | EXTENDED_RELEASE_TABLET | Freq: Two times a day (BID) | ORAL | Status: DC
  Administered 2019-10-07 – 2019-10-09 (×4): 40 meq via ORAL
  Filled 2019-10-07 (×4): qty 2

## 2019-10-07 NOTE — Plan of Care (Signed)
  Problem: Education: Goal: Knowledge of treatment and prevention of peritonitis will improve Outcome: Progressing

## 2019-10-07 NOTE — Progress Notes (Signed)
[facility] Progress Note   Subjective:   Patient seen and examined at bedside.  Tolerated PD overnight.  Reports LLQ pain today and nausea.  Otherwise doing ok.  Denies CP, SOB, fever, chills, vomiting and diarrhea.  Constipation improving.    Objective Vitals:   10/07/19 0143 10/07/19 0441 10/07/19 0741 10/07/19 0921  BP:  122/81  123/90  Pulse:  91  89  Resp:  16  18  Temp:  98.3 F (36.8 C)  98.1 F (36.7 C)  TempSrc:  Oral  Oral  SpO2:  98%  99%  Weight: 74.9 kg  72.8 kg   Height:       Physical Exam General:WDWN female in NAD Heart:RRR Lungs:CTAB Abdomen:soft, +tenderness in LLQ, PD cath in RLQ Extremities:no LE edema Dialysis Access: PD cath, LU AVF +t/b  Filed Weights   10/05/19 0730 10/07/19 0143 10/07/19 0741  Weight: 72.5 kg 74.9 kg 72.8 kg    Intake/Output Summary (Last 24 hours) at 10/07/2019 1230 Last data filed at 10/07/2019 0900 Gross per 24 hour  Intake 10692.22 ml  Output 10642 ml  Net 50.22 ml    Additional Objective Labs: Basic Metabolic Panel: Recent Labs  Lab 10/05/19 0536 10/06/19 0338 10/07/19 0608  NA 133* 136 137  K 3.6 3.5 3.2*  CL 90* 92* 91*  CO2 20* 25 28  GLUCOSE 101* 114* 88  BUN 74* 67* 62*  CREATININE 13.61* 13.07* 12.88*  CALCIUM 9.6 9.0 7.9*  PHOS 6.7* 6.0* 4.7*   Liver Function Tests: Recent Labs  Lab 10/05/19 0536 10/06/19 0338 10/07/19 0608  ALBUMIN 2.0* 2.0* 1.9*   CBC: Recent Labs  Lab 10/04/19 0500 10/04/19 0500 10/05/19 0536 10/06/19 0338 10/07/19 0608  WBC 11.4*   < > 8.9 9.2 10.0  NEUTROABS 9.5*  --   --   --   --   HGB 13.6   < > 15.1* 14.5 13.4  HCT 40.4   < > 45.2 42.3 39.0  MCV 97.3  --  97.0 96.1 95.6  PLT 161   < > 153 158 175   < > = values in this interval not displayed.   Blood Culture    Component Value Date/Time   SDES PERITONEAL DIALYSIS 10/04/2019 1100   SDES PERITONEAL DIALYSIS 10/04/2019 1100   SPECREQUEST NONE 10/04/2019 1100   SPECREQUEST NONE 10/04/2019  1100   CULT FEW ACINETOBACTER CALCOACETICUS/BAUMANNII COMPLEX 10/04/2019 1100   CULT  10/04/2019 1100    NO FUNGUS ISOLATED AFTER 3 DAYS Performed at [facility], [place] [address] [place], [place] [address]    REPTSTATUS 10/06/2019 FINAL 10/04/2019 1100   REPTSTATUS PENDING 10/04/2019 1100    Medications: . sodium chloride 500 mL (10/05/19 0846)  . dialysis solution 1.5% low-MG/low-CA    . dialysis solution 2.5% low-MG/low-CA    . meropenem (MERREM) IV 500 mg (10/07/19 5784)   . cinacalcet  180 mg Oral Q supper  . gentamicin cream  1 application Topical Daily  . heparin  5,000 Units Subcutaneous Q8H  . lactulose  10 g Oral BID  . polyethylene glycol  17 g Oral Daily  . potassium chloride  40 mEq Oral Daily  . sucroferric oxyhydroxide  1,500 mg Oral TID WC    Dialysis Orders: Center:[facility] Followed by Dr. Joelyn Oms. 7X Week, EDW 76kg,Dextrose 1.5; 2.5; 4.25 %, # Exchanges 6, fill vol 3000 mL, Dwell Time 1 hrs 15 min, last fill vol 1500 mL, Day Exchanges 1, daytime fill vol 2000  mL, Day Dwell Time 3 hrs 0 min Uses 3 exchanges with 1.5% and 1 exchange 2.% dextrose  Assessment/Plan: 1. Peritonitis:Unable to fill or drain fluid since 09/30/19 with + cloudy fluid and abdominal pain. CXR unremarkable. Highcell count in peritoneal fluid.Fortunately have been able to continue PD here. Given IV vanc and fortaz> IP antibiotics. Peritoneal culture growing acinetobacter. Pharmacy recommending d/c IP antibiotics and starting meropenem IV. Cell count improved to 325, fluid remains hazy.  2. ESRD:BUN and creatinine elevated compared to baseline but not acutely uremic or volume overloaded, minimal improvement after PD.  Continue 5 exchanges, 3 with 1.5% and 2 with 2.5% with heparinized bags. Does have AVF for back up if HD is needed, not indicated at this time.  3. Hypokalemia:K+ 3.0on presentation. Pt is supposed to be taking KCl daily but reports poor compliance,improved to 3.5  today. Continue potassium chloride 40 mEq daily 4. Constipation:Ongoing but improving, continue scheduled lactulose BID 5. Hypertension/volume:BP in goal today.  SOB resolved.   6. Anemia:Hgb13.4. Not on outpatient ESA and none indicated at present. 7. Metabolic bone disease:Last iPTH 1128, on max sensipar and calcitriol 0.65mcg daily however has questionable compliance.Corrected calcium high, hold calcitriol for now. Continue sensipar.Phos 6.0.Continue outpatient binder (velphoro).  Jen Mow, PA-C [facility] Pager: [phone] 10/07/2019,12:30 PM  LOS: 3 days

## 2019-10-07 NOTE — Progress Notes (Signed)
   Subjective: Pt seen at the bedside this morning. States she is feeling somewhat improved with decreased nausea/vomiting and abdominal pain. Denies fevers/chills or shortness of breath. Had one small bowel movement yesterday. Tolerating PD well overnight. Discussed antibiotic change and continued monitoring over the weekend. Pt expressed understanding and had no acute concerns at this time.   Objective:  Vital signs in last 24 hours: Vitals:   10/06/19 2044 10/06/19 2153 10/07/19 0143 10/07/19 0441  BP: (!) 148/109 132/87  122/81  Pulse: (!) 123 99  91  Resp: 18   16  Temp: 98.8 F (37.1 C)   98.3 F (36.8 C)  TempSrc: Oral   Oral  SpO2: 97%   98%  Weight:   74.9 kg   Height:       Physical Exam Vitals and nursing note reviewed.  Constitutional:      General: She is not in acute distress.    Appearance: She is not ill-appearing.  Cardiovascular:     Rate and Rhythm: Normal rate and regular rhythm.     Heart sounds: Normal heart sounds.  Pulmonary:     Effort: Pulmonary effort is normal.  Abdominal:     General: Abdomen is flat. Bowel sounds are normal. There is no distension.     Palpations: Abdomen is soft.     Tenderness: There is no abdominal tenderness.  Neurological:     Mental Status: She is alert.    Assessment/Plan:  Active Problems:   ESRD on peritoneal dialysis ([place])   Peritonitis ([place])   PD catheter dysfunction ([place])  Ms. Grunert is a 54 year old F with significant PMH of ESRD on PD, who presented for abdominal pain and difficulty flushing the PD catheter at home concerning for peritonitis.  Abdominal Pain Acinetobacter Peritonitis Improving abdominal pain, nausea, and vomiting. Peritoneal cell count from 3/2 elevated with neutrophilic predominance, culture growing few acinetobacter calcoaceticus/baumannii.  Nephrology consulted. - repeat cell count today, much improved 325 << 2,230 - continue to collect cell counts with PD daily - continue  meropenem 500mg  IV daily - discuss with ID recommendations for outpatient regimen to determine if pt needs PICC placement - pain control with Norco 7.5-325mg  PO q6h PRN - compazine PRN for nausea - continue miralax and lactulose for constipation   ESRD on PD Nephrology consulted, appreciate recommendations - continue PD at night - no additional HD indicated - supplementation with PO KCl 40mEq daily for hypokalemia  WIO:[id number] not on antihypertensive therapies at home BP and HR well controlled today - continue to monitor  Prior to Admission Living Arrangement: home Anticipated Discharge Location: home Barriers to Discharge: outpatient antibiotic recommendations. Dispo: Anticipated discharge in approximately 1-2 day(s).   Ladona Horns, MD 10/07/2019, 6:39 AM Pager: [phone]

## 2019-10-08 DIAGNOSIS — Z79899 Other long term (current) drug therapy: Secondary | ICD-10-CM | POA: Diagnosis not present

## 2019-10-08 DIAGNOSIS — Z992 Dependence on renal dialysis: Secondary | ICD-10-CM | POA: Diagnosis not present

## 2019-10-08 DIAGNOSIS — D631 Anemia in chronic kidney disease: Secondary | ICD-10-CM | POA: Diagnosis not present

## 2019-10-08 DIAGNOSIS — B9689 Other specified bacterial agents as the cause of diseases classified elsewhere: Secondary | ICD-10-CM

## 2019-10-08 DIAGNOSIS — N186 End stage renal disease: Secondary | ICD-10-CM

## 2019-10-08 DIAGNOSIS — K652 Spontaneous bacterial peritonitis: Secondary | ICD-10-CM

## 2019-10-08 DIAGNOSIS — K650 Generalized (acute) peritonitis: Secondary | ICD-10-CM | POA: Diagnosis not present

## 2019-10-08 DIAGNOSIS — K659 Peritonitis, unspecified: Secondary | ICD-10-CM | POA: Diagnosis not present

## 2019-10-08 LAB — BODY FLUID CELL COUNT WITH DIFFERENTIAL
Eos, Fluid: 0 %
Lymphs, Fluid: 13 %
Monocyte-Macrophage-Serous Fluid: 35 % — ABNORMAL LOW (ref 50–90)
Neutrophil Count, Fluid: 52 % — ABNORMAL HIGH (ref 0–25)
Total Nucleated Cell Count, Fluid: 36 cu mm (ref 0–1000)

## 2019-10-08 LAB — RENAL FUNCTION PANEL
Albumin: 1.9 g/dL — ABNORMAL LOW (ref 3.5–5.0)
Anion gap: 17 — ABNORMAL HIGH (ref 5–15)
BUN: 56 mg/dL — ABNORMAL HIGH (ref 6–20)
CO2: 25 mmol/L (ref 22–32)
Calcium: 7.8 mg/dL — ABNORMAL LOW (ref 8.9–10.3)
Chloride: 93 mmol/L — ABNORMAL LOW (ref 98–111)
Creatinine, Ser: 12.06 mg/dL — ABNORMAL HIGH (ref 0.44–1.00)
GFR calc Af Amer: 4 mL/min — ABNORMAL LOW (ref >60)
GFR calc non Af Amer: 3 mL/min — ABNORMAL LOW (ref >60)
Glucose, Bld: 100 mg/dL — ABNORMAL HIGH (ref 70–99)
Phosphorus: 3.9 mg/dL (ref 2.5–4.6)
Potassium: 3.8 mmol/L (ref 3.5–5.1)
Sodium: 135 mmol/L (ref 135–145)

## 2019-10-08 LAB — CBC
HCT: 39.7 % (ref 36.0–46.0)
Hemoglobin: 13.3 g/dL (ref 12.0–15.0)
MCH: 32.3 pg (ref 26.0–34.0)
MCHC: 33.5 g/dL (ref 30.0–36.0)
MCV: 96.4 fL (ref 80.0–100.0)
Platelets: 195 10*3/uL (ref 150–400)
RBC: 4.12 MIL/uL (ref 3.87–5.11)
RDW: 13.7 % (ref 11.5–15.5)
WBC: 8.1 10*3/uL (ref 4.0–10.5)
nRBC: 0 % (ref 0.0–0.2)

## 2019-10-08 MED ORDER — DELFLEX-LC/2.5% DEXTROSE 394 MOSM/L IP SOLN: INTRAPERITONEAL | Status: DC

## 2019-10-08 MED ORDER — HEPARIN 1000 UNIT/ML FOR PERITONEAL DIALYSIS
INTRAPERITONEAL | Status: DC | PRN
  Filled 2019-10-08 (×3): qty 3000

## 2019-10-08 MED ORDER — HEPARIN 1000 UNIT/ML FOR PERITONEAL DIALYSIS
INTRAPERITONEAL | Status: DC | PRN
  Filled 2019-10-08 (×4): qty 5000

## 2019-10-08 MED ORDER — DELFLEX-LC/1.5% DEXTROSE 344 MOSM/L IP SOLN: INTRAPERITONEAL | Status: DC

## 2019-10-08 MED ORDER — GENTAMICIN SULFATE 0.1 % EX CREA
1.0000 "application " | TOPICAL_CREAM | Freq: Every day | CUTANEOUS | Status: DC
  Administered 2019-10-08 – 2019-10-09 (×2): 1 via TOPICAL
  Filled 2019-10-08: qty 15

## 2019-10-08 MED ORDER — LACTULOSE 10 GM/15ML PO SOLN: 10.0000 g | Freq: Two times a day (BID) | ORAL | Status: DC | PRN

## 2019-10-08 MED ORDER — PROCHLORPERAZINE EDISYLATE 10 MG/2ML IJ SOLN
5.0000 mg | Freq: Four times a day (QID) | INTRAMUSCULAR | Status: DC | PRN
  Administered 2019-10-08: 5 mg via INTRAVENOUS
  Filled 2019-10-08: qty 2

## 2019-10-08 MED ORDER — HEPARIN 1000 UNIT/ML FOR PERITONEAL DIALYSIS: 500.0000 [IU] | INTRAMUSCULAR | Status: DC | PRN

## 2019-10-08 MED ORDER — HEPARIN 1000 UNIT/ML FOR PERITONEAL DIALYSIS
INTRAPERITONEAL | Status: DC | PRN
  Filled 2019-10-08 (×2): qty 3000

## 2019-10-08 MED ORDER — HEPARIN 1000 UNIT/ML FOR PERITONEAL DIALYSIS
INTRAPERITONEAL | Status: DC | PRN
  Filled 2019-10-08 (×2): qty 5000

## 2019-10-08 NOTE — Consult Note (Addendum)
[facility] for Infectious Disease    Date of Admission:  10/03/2019   Total days of antibiotics 5/ day 3 of meropenem   ID: Ruth Gutierrez is a 54 y.o. female with  First  Episode of pd related peritonitis. cx showing acinetobacter Active Problems:   ESRD on peritoneal dialysis ([place])   Peritonitis ([place])   PD catheter dysfunction ([place])   HPI: 54yoF with ESRD admitted on  3/2 with  New onset abdominal pain and also difficulty with peritoneal dialysis catheter. She was referred to the hospital for evaluation of peritonitis. Her peritoneal fluid showed wbc of 2,230 with 95%N, cloudy, and cx growing acinetobacter. She was started on IP ceftaz. Her peritoneal fluid cell count was repeated after 2nd IP dose- showing improvement with wbc of 324 and diff of 69percent. No repeat cultures. She had some residual abdominal pain but improved. Nephrology would like to keep PD in place. Cell count on 3/6 shows total cells of 36 with 50% neutrophils. Overall much improved. She was switched to iv meropenem until sensitivies returned on peritoneal fluid cultures. She feels nearly back to her baseline health. Subjective: Afebrile, less abdominal pain  Medications:  . cinacalcet  180 mg Oral Q supper  . gentamicin cream  1 application Topical Daily  . heparin  5,000 Units Subcutaneous Q8H  . polyethylene glycol  17 g Oral Daily  . potassium chloride  40 mEq Oral BID  . sucroferric oxyhydroxide  1,500 mg Oral TID WC    Objective: Vital signs in last 24 hours: Temp:  [97.7 F (36.5 C)-98.1 F (36.7 C)] 98 F (36.7 C) (03/06 0915) Pulse Rate:  [88-93] 89 (03/06 0915) Resp:  [18-20] 18 (03/06 0915) BP: (110-147)/(81-100) 140/92 (03/06 0915) SpO2:  [96 %-97 %] 97 % (03/06 0503) Weight:  [70 kg] 70 kg (03/06 0915) Physical Exam  Constitutional:  oriented to person, place, and time. appears well-developed and well-nourished. No distress.  HENT: [place]/AT, PERRLA, no scleral icterus Mouth/Throat:  Oropharynx is clear and moist. No oropharyngeal exudate.  Cardiovascular: Normal rate, regular rhythm and normal heart sounds. Exam reveals no gallop and no friction rub.  No murmur heard.  Pulmonary/Chest: Effort normal and breath sounds normal. No respiratory distress.  has no wheezes.  Neck = supple, no nuchal rigidity Abdominal: Soft. Bowel sounds are normal.  exhibits no distension. There is no tenderness. PD+ cath in place Lymphadenopathy: no cervical adenopathy. No axillary adenopathy Neurological: alert and oriented to person, place, and time.  Skin: Skin is warm and dry. No rash noted. No erythema.  Psychiatric: a normal mood and affect.  behavior is normal. Soft spoken   Lab Results Recent Labs    10/07/19 0608 10/08/19 0440  WBC 10.0 8.1  HGB 13.4 13.3  HCT 39.0 39.7  NA 137 135  K 3.2* 3.8  CL 91* 93*  CO2 28 25  BUN 62* 56*  CREATININE 12.88* 12.06*   Liver Panel Recent Labs    10/07/19 0608 10/08/19 0440  ALBUMIN 1.9* 1.9*    Microbiology: 3/2 body fluid + Acinetobacter calcoaceticus/baumannii complex      MIC    AMPICILLIN/SULBACTAM 4 SENSITIVE  Sensitive    CEFTAZIDIME 4 SENSITIVE  Sensitive    CIPROFLOXACIN <=0.25 SENS... Sensitive    GENTAMICIN <=1 SENSITIVE  Sensitive    IMIPENEM <=0.25 SENS... Sensitive    PIP/TAZO >=128 RESIS... Resistant    TRIMETH/SULFA >=320 RESIS... Resistant     Studies/Results: No results found.  Assessment/Plan: Acinetobacter peritonitis = plan to treat for 14 days. Would recommend to keep on meropenem until ready for discharge, then finish course with intra-peritoneal ceftaz to complete 14 day course. She is currently on day 5 of treatment  Please add aerobic culture to peritoneal cell count from today to ensure fluid is now sterile  Since first episode, and can generally eradicate infection, can keep PD in place and doesn't necessarily need removal at this time.  [facility] Infectious  D[facility] Cell: [phone] Pager: [phone]  10/08/2019, 2:15 PM

## 2019-10-08 NOTE — Plan of Care (Signed)
  Problem: Education: Goal: Knowledge of treatment and prevention of peritonitis will improve Outcome: Progressing   Problem: Fluid Volume: Goal: Fluid volume balance will be maintained or improved Outcome: Progressing   Problem: Nutritional: Goal: Ability to make appropriate dietary choices will improve Outcome: Progressing

## 2019-10-08 NOTE — Progress Notes (Signed)
Internal Medicine Attending Note:  I have seen and evaluated this patient and I have discussed the plan of care with the house staff. Please see their note for complete details. I concur with their findings.  Velna Ochs, MD 10/08/2019, 11:27 AM

## 2019-10-08 NOTE — Plan of Care (Signed)
  Problem: Education: Goal: Knowledge of treatment and prevention of peritonitis will improve Outcome: Progressing

## 2019-10-08 NOTE — Progress Notes (Signed)
   Subjective: Pt seen at the bedside this morning. Endorsing that her abdominal pain, nausea, and vomiting have all improved. Pt tolerated PD session well overnight, had some intermittent abdominal pain. Endorsing 3-4 BMs yesterday.  Objective:  Vital signs in last 24 hours: Vitals:   10/07/19 0921 10/07/19 1620 10/07/19 2037 10/08/19 0503  BP: 123/90 110/81 (!) 147/100 (!) 137/92  Pulse: 89 88 93 91  Resp: 18 18 20 18   Temp: 98.1 F (36.7 C) 98.1 F (36.7 C) 97.7 F (36.5 C) 98 F (36.7 C)  TempSrc: Oral Oral Oral Oral  SpO2: 99% 96% 97% 97%  Weight:      Height:       Physical Exam Vitals and nursing note reviewed.  Constitutional:      General: She is not in acute distress.    Appearance: She is not ill-appearing.     Comments: Pt sitting up in the bed this morning. Appears comfortable.   Abdominal:     General: Abdomen is flat. There is no distension.     Palpations: Abdomen is soft.     Tenderness: There is abdominal tenderness (mild central tenderness).  Skin:    General: Skin is warm and dry.     Comments: PD catheter site without surround erythema, drainage, or signs of infection.  Neurological:     Mental Status: She is alert.    Assessment/Plan:  Active Problems:   ESRD on peritoneal dialysis ([place])   Peritonitis ([place])   PD catheter dysfunction ([place])  Ruth Gutierrez is a 54 year old F with significant PMH of ESRD on PD, who presented for abdominal pain and difficulty flushing the PD catheter at home concerning for peritonitis.  Abdominal Pain Peritonitis Symptoms much improved in regards to abdominal pain and nausea/vomiting. Initial cell count from peritoneal fluid on 3/2 elevated with neutrophilic predominance and few acinetobacter calcoaceticus/baumannii. Repeat cell count from 3/5 returned to normal though still with neutrophilic predominance.  - will follow-up with ID recommendations for an outpatient antibiotic regimen Nephrology consulted. -  obtain peritoneal cell count daily, today's pending - continue antibiotics therapy with IV meropenem 500mg  IV daily (today is day 3) - pain control with Norco 7.5-325mg  PO q6h PRN - decrease bowel regimen to lactulose BID PRN and daily miralax for constipation  ESRD on PD Nephrology consulted, appreciate recommendations - continue PD overnight - continue 1mEq PO KCl daily supplementation  [id number] not on antihypertensive therapies at home Improved and in acceptable range. Will continue to monitor.  Prior to Admission Living Arrangement: home Anticipated Discharge Location: home with home health Barriers to Discharge: potential for outpatient IV antibiotics Dispo: Anticipated discharge Monday 3/8  Ladona Horns, MD 10/08/2019, 6:30 AM Pager: [phone]

## 2019-10-08 NOTE — Progress Notes (Addendum)
[facility] Progress Note   Subjective:   Patient seen in room. Abdominal pain improving. Denies nausea, appetite is slowly returning. Constipation resolved. Denies cramping with HD. Denies SOB, CP, palpitations and dizziness.   Objective Vitals:   10/07/19 1620 10/07/19 2037 10/08/19 0503 10/08/19 0915  BP: 110/81 (!) 147/100 (!) 137/92 (!) 140/92  Pulse: 88 93 91 89  Resp: 18 20 18 18   Temp: 98.1 F (36.7 C) 97.7 F (36.5 C) 98 F (36.7 C) 98 F (36.7 C)  TempSrc: Oral Oral Oral Oral  SpO2: 96% 97% 97%   Weight:    70 kg  Height:       Physical Exam General:Well developed, well nourished female in NAD Heart:RRR. No murmurs, rubs or gallops Lungs:CTA bilaterally without wheezing, rhonchi or rales Abdomen:Soft, non-distended, mildly tender to palpation LLQ. +BS, PD cath without erythema/drainage Extremities:No edema b/l lower extremities Dialysis Access:PD cath without surround erythema/drainage,LUE AVF + thrill/bruit  Additional Objective Labs: Basic Metabolic Panel: Recent Labs  Lab 10/06/19 0338 10/07/19 0608 10/08/19 0440  NA 136 137 135  K 3.5 3.2* 3.8  CL 92* 91* 93*  CO2 25 28 25   GLUCOSE 114* 88 100*  BUN 67* 62* 56*  CREATININE 13.07* 12.88* 12.06*  CALCIUM 9.0 7.9* 7.8*  PHOS 6.0* 4.7* 3.9   Liver Function Tests: Recent Labs  Lab 10/06/19 0338 10/07/19 0608 10/08/19 0440  ALBUMIN 2.0* 1.9* 1.9*   CBC: Recent Labs  Lab 10/04/19 0500 10/04/19 0500 10/05/19 0536 10/05/19 0536 10/06/19 0338 10/07/19 0608 10/08/19 0440  WBC 11.4*   < > 8.9   < > 9.2 10.0 8.1  NEUTROABS 9.5*  --   --   --   --   --   --   HGB 13.6   < > 15.1*   < > 14.5 13.4 13.3  HCT 40.4   < > 45.2   < > 42.3 39.0 39.7  MCV 97.3  --  97.0  --  96.1 95.6 96.4  PLT 161   < > 153   < > 158 175 195   < > = values in this interval not displayed.   Blood Culture    Component Value Date/Time   SDES PERITONEAL DIALYSIS 10/04/2019 1100   SDES  PERITONEAL DIALYSIS 10/04/2019 1100   SPECREQUEST NONE 10/04/2019 1100   SPECREQUEST NONE 10/04/2019 1100   CULT FEW ACINETOBACTER CALCOACETICUS/BAUMANNII COMPLEX 10/04/2019 1100   CULT  10/04/2019 1100    NO FUNGUS ISOLATED AFTER 3 DAYS Performed at [facility], [place] [address] [place], [place] [address]    REPTSTATUS 10/06/2019 FINAL 10/04/2019 1100   REPTSTATUS PENDING 10/04/2019 1100    Medications: . sodium chloride 500 mL (10/05/19 0846)  . dialysis solution 1.5% low-MG/low-CA    . dialysis solution 2.5% low-MG/low-CA    . meropenem (MERREM) IV 500 mg (10/08/19 0930)   . cinacalcet  180 mg Oral Q supper  . gentamicin cream  1 application Topical Daily  . heparin  5,000 Units Subcutaneous Q8H  . polyethylene glycol  17 g Oral Daily  . potassium chloride  40 mEq Oral BID  . sucroferric oxyhydroxide  1,500 mg Oral TID WC    Dialysis Orders: Center:[facility] Followed by Dr. Joelyn Oms. 7X Week, EDW 76kg,Dextrose 1.5; 2.5; 4.25 %, # Exchanges 6, fill vol 3000 mL, Dwell Time 1 hrs 15 min, last fill vol 1500 mL, Day Exchanges 1, daytime fill vol 2000 mL, Day Dwell Time 3 hrs 0  min Uses 3 exchanges with 1.5% and 1 exchange 2.% dextrose  Assessment/Plan: 1. Peritonitis:Unable to fill or drain fluid since 09/30/19 with + cloudy fluid and abdominal pain. CXR unremarkable. Highcell count in peritoneal fluid.Fortunately have been able to continue PD here. Given IV vanc and fortaz> IP antibiotics.Peritoneal culture growing acinetobacter. Pharmacy recommending d/c IP antibiotics and starting meropenem IV.Cell count improved to 325, fluid remains hazy.  2. ESRD:BUN and creatinine elevated compared to baseline but not acutely uremic or volume overloaded, minimal improvement after PD.Continue5 exchanges, 3 with 1.5% and 2 with 2.5% with heparinized bags. Does have AVF for back up if HD is needed, not indicated at this time.  3. Hypokalemia:K+ 3.0on presentation. Pt is supposed to  be taking KCl daily but reports poor compliance,improved to 3.8today. Potassium chloride 40 mEq increased to BID. 4. Constipation:Ongoing but improving,lactulose decreased to PRN 5. Hypertension/volume:BP minimally elevated.  SOB resolved.  UF with PD.  6. Anemia:Hgb13.3. Not on outpatient ESA and none indicated at present. 7. Metabolic bone disease:Last iPTH 1128, on max sensipar and calcitriol 0.87mcg daily however has questionable compliance.Corrected calcium high previously, calcitriol held, Corr Ca now 9.5. Continue sensipar.Phos 6.0 > 3.9.Continue outpatient binder (velphoro).  Anice Paganini, PA-C 10/08/2019, 10:47 AM  [facility] Pager: [phone]  Nephrology attending: Patient was seen and examined at bedside.  Chart reviewed.  I agree with assessment plan as outlined above.  54 year old female ESRD on PD admitted with peritonitis.  The culture growing Acinetobacter which is actually sensitive to [place].  Patient is currently getting IV meropenem because of risk of resistance to [place].  I have discussed with Dr. Baxter Flattery yesterday about antibiotics on discharge, she will get back to me either today or tomorrow.  Preferred IP Fortaz over IV meropenem.  Patient is clinically improving.  The cell count is improved.   Continue 5 exchanges, no day fill.  Discussed with the primary team.  Katheran James, MD [facility].

## 2019-10-09 DIAGNOSIS — Z79899 Other long term (current) drug therapy: Secondary | ICD-10-CM | POA: Diagnosis not present

## 2019-10-09 DIAGNOSIS — K659 Peritonitis, unspecified: Secondary | ICD-10-CM | POA: Diagnosis not present

## 2019-10-09 DIAGNOSIS — K650 Generalized (acute) peritonitis: Secondary | ICD-10-CM | POA: Diagnosis not present

## 2019-10-09 DIAGNOSIS — D631 Anemia in chronic kidney disease: Secondary | ICD-10-CM | POA: Diagnosis not present

## 2019-10-09 DIAGNOSIS — Z992 Dependence on renal dialysis: Secondary | ICD-10-CM | POA: Diagnosis not present

## 2019-10-09 DIAGNOSIS — A498 Other bacterial infections of unspecified site: Secondary | ICD-10-CM

## 2019-10-09 DIAGNOSIS — N186 End stage renal disease: Secondary | ICD-10-CM | POA: Diagnosis not present

## 2019-10-09 LAB — CBC
HCT: 39.8 % (ref 36.0–46.0)
Hemoglobin: 13.6 g/dL (ref 12.0–15.0)
MCH: 32.2 pg (ref 26.0–34.0)
MCHC: 34.2 g/dL (ref 30.0–36.0)
MCV: 94.1 fL (ref 80.0–100.0)
Platelets: 193 10*3/uL (ref 150–400)
RBC: 4.23 MIL/uL (ref 3.87–5.11)
RDW: 13.2 % (ref 11.5–15.5)
WBC: 7.8 10*3/uL (ref 4.0–10.5)
nRBC: 0 % (ref 0.0–0.2)

## 2019-10-09 LAB — RENAL FUNCTION PANEL
Albumin: 2 g/dL — ABNORMAL LOW (ref 3.5–5.0)
Anion gap: 14 (ref 5–15)
BUN: 46 mg/dL — ABNORMAL HIGH (ref 6–20)
CO2: 25 mmol/L (ref 22–32)
Calcium: 7.6 mg/dL — ABNORMAL LOW (ref 8.9–10.3)
Chloride: 95 mmol/L — ABNORMAL LOW (ref 98–111)
Creatinine, Ser: 11.38 mg/dL — ABNORMAL HIGH (ref 0.44–1.00)
GFR calc Af Amer: 4 mL/min — ABNORMAL LOW (ref >60)
GFR calc non Af Amer: 3 mL/min — ABNORMAL LOW (ref >60)
Glucose, Bld: 81 mg/dL (ref 70–99)
Phosphorus: 3 mg/dL (ref 2.5–4.6)
Potassium: 4.3 mmol/L (ref 3.5–5.1)
Sodium: 134 mmol/L — ABNORMAL LOW (ref 135–145)

## 2019-10-09 LAB — CULTURE, BLOOD (ROUTINE X 2)
Culture: NO GROWTH
Culture: NO GROWTH
Special Requests: ADEQUATE
Special Requests: ADEQUATE

## 2019-10-09 LAB — BODY FLUID CELL COUNT WITH DIFFERENTIAL
Eos, Fluid: 10 %
Lymphs, Fluid: 13 %
Monocyte-Macrophage-Serous Fluid: 37 % — ABNORMAL LOW (ref 50–90)
Neutrophil Count, Fluid: 40 % — ABNORMAL HIGH (ref 0–25)
Total Nucleated Cell Count, Fluid: 28 cu mm (ref 0–1000)

## 2019-10-09 MED ORDER — POTASSIUM CHLORIDE CRYS ER 20 MEQ PO TBCR
40.0000 meq | EXTENDED_RELEASE_TABLET | Freq: Every day | ORAL | Status: DC
  Administered 2019-10-10: 40 meq via ORAL
  Filled 2019-10-09: qty 4

## 2019-10-09 MED ORDER — DELFLEX-LC/1.5% DEXTROSE 344 MOSM/L IP SOLN: INTRAPERITONEAL | Status: DC

## 2019-10-09 MED ORDER — DELFLEX-LC/2.5% DEXTROSE 394 MOSM/L IP SOLN: INTRAPERITONEAL | Status: DC

## 2019-10-09 MED ORDER — HEPARIN 1000 UNIT/ML FOR PERITONEAL DIALYSIS: 500.0000 [IU] | INTRAMUSCULAR | Status: DC | PRN

## 2019-10-09 MED ORDER — GENTAMICIN SULFATE 0.1 % EX CREA: 1.0000 "application " | TOPICAL_CREAM | Freq: Every day | CUTANEOUS | Status: DC

## 2019-10-09 NOTE — Progress Notes (Addendum)
[facility] Progress Note   Subjective:   Patient seen in room. Abdominal pain continuing to improve. Denies nausea, vomiting, constipation, CP, palpitations and dizziness. Mild SOB this AM but also reports cramping with PD last night.   Objective Vitals:   10/08/19 1744 10/08/19 2036 10/09/19 0500 10/09/19 0605  BP: 137/90 (!) 146/87  129/88  Pulse: 90 88  85  Resp:  18  17  Temp: 98.2 F (36.8 C) 98.1 F (36.7 C)  98.2 F (36.8 C)  TempSrc: Oral Oral  Oral  SpO2: 98% 100%  100%  Weight:  70.7 kg 70.7 kg   Height:       Physical Exam General:Well developed,well nourished female in NAD Heart:RRR. No murmurs, rubs or gallops Lungs:CTA bilaterally without wheezing, rhonchi or rales Abdomen:Soft, non-distended,mildlytender to palpation LLQ. +BS, PD cath without erythema/drainage Extremities:No edema b/l lower extremities Dialysis Access:PD cath without surround erythema/drainage,LUE AVF + thrill  Additional Objective Labs: Basic Metabolic Panel: Recent Labs  Lab 10/07/19 0608 10/08/19 0440 10/09/19 0537  NA 137 135 134*  K 3.2* 3.8 4.3  CL 91* 93* 95*  CO2 28 25 25   GLUCOSE 88 100* 81  BUN 62* 56* 46*  CREATININE 12.88* 12.06* 11.38*  CALCIUM 7.9* 7.8* 7.6*  PHOS 4.7* 3.9 3.0   Liver Function Tests: Recent Labs  Lab 10/07/19 0608 10/08/19 0440 10/09/19 0537  ALBUMIN 1.9* 1.9* 2.0*   CBC: Recent Labs  Lab 10/04/19 0500 10/04/19 0500 10/05/19 0536 10/05/19 0536 10/06/19 0338 10/06/19 0338 10/07/19 0608 10/08/19 0440 10/09/19 0537  WBC 11.4*   < > 8.9   < > 9.2   < > 10.0 8.1 7.8  NEUTROABS 9.5*  --   --   --   --   --   --   --   --   HGB 13.6   < > 15.1*   < > 14.5   < > 13.4 13.3 13.6  HCT 40.4   < > 45.2   < > 42.3   < > 39.0 39.7 39.8  MCV 97.3   < > 97.0  --  96.1  --  95.6 96.4 94.1  PLT 161   < > 153   < > 158   < > 175 195 193   < > = values in this interval not displayed.   Blood Culture    Component Value  Date/Time   SDES PERITONEAL 10/08/2019 0915   SPECREQUEST NONE 10/08/2019 0915   CULT  10/08/2019 0915    NO GROWTH < 24 HOURS Performed at [facility], [place] [address], [place], [place] [address]    REPTSTATUS PENDING 10/08/2019 0915    Medications: . sodium chloride 500 mL (10/05/19 0846)  . dialysis solution 1.5% low-MG/low-CA    . dialysis solution 2.5% low-MG/low-CA    . meropenem (MERREM) IV 500 mg (10/09/19 0825)   . cinacalcet  180 mg Oral Q supper  . gentamicin cream  1 application Topical Daily  . heparin  5,000 Units Subcutaneous Q8H  . polyethylene glycol  17 g Oral Daily  . potassium chloride  40 mEq Oral BID  . sucroferric oxyhydroxide  1,500 mg Oral TID WC    Dialysis Orders: Center:[facility] Followed by Dr. Joelyn Oms. 7X Week, EDW 76kg,Dextrose 1.5; 2.5; 4.25 %, # Exchanges 6, fill vol 3000 mL, Dwell Time 1 hrs 15 min, last fill vol 1500 mL, Day Exchanges 1, daytime fill vol 2000 mL, Day Dwell Time 3 hrs  0 min Uses 3 exchanges with 1.5% and 1 exchange 2.5% dextrose  Assessment/Plan: 1. Peritonitis:Unable to fill or drain fluid since 09/30/19 with + cloudy fluid and abdominal pain.  Highcell count in peritoneal fluid.Fortunately have been able to continue PD here. Given IV vanc and fortaz> IP antibiotics.Peritoneal culture growing acinetobacter> started meropenem IV.Cell count improved to 325, fluid clear, repeat culture 3/6 NGTD. Appreciate infectious disease input, recommending continuing meropenem unti d/c then finishing course with IP ceftaz for a total of 14 days (on day 6 today). 2. ESRD:Not acutely uremic or volume overloaded, minimal improvement after PD. Continue PD with heparinized bags. Does have AVF for back up if HD is needed, not indicated at this time. 3. Hypokalemia:K+ 3.0on presentation. Pt is supposed to be taking KCl daily but reports poor compliance,improved to 4.3today. Potassium chloride 40 mEq increased to  BID. 4. Constipation:Resolved,lactulose decreased to PRN 5. Hypertension/volume:Well below last outpatient EDW. BPminimally elevated. Reporting some SOB and has not had much UF with HD, will increase to 3 exchanges with 2.5%, 2 exchanges with 1.5%.  6. Anemia:Hgb13.6. Not on outpatient ESA and none indicated at present. 7. Metabolic bone disease:Last iPTH 1128, on max sensipar and calcitriol 0.64mcg daily however has questionable compliance.Corrected calcium high previously, calcitriol held, Corr Ca now 9.2, resume calcitriol. Continue sensipar.Phos 6.0 > 3.9 > 3.0.Continue outpatient binder (velphoro), may need to decrease dose if phos decreases further.   Anice Paganini, PA-C 10/09/2019, 9:34 AM  [facility] Pager: [phone]  Nephrology attending:  Patient was seen and examined at bedside.  Chart reviewed.  I agree with assessment and plan as outlined above.  ESRD on PD admitted with Acinetobacter peritonitis.  Initially received vancomycin and Fortaz and then switched to meropenem when culture results are back.  Clinical symptoms and cell count significantly improved.  Denies nausea vomiting or abdominal pain.  Continue PD today, prescription changed as discussed above.  We will keep fill volume 2.5 L for now.  Hopefully patient can go home with intraperitoneal Fortaz, total antibiotics course for 14 days.  Need to coordinate with outpatient home therapy unit on discharge.  Lower potassium chloride to once a day.  Katheran James, MD [facility].

## 2019-10-09 NOTE — Progress Notes (Signed)
   Subjective:   She is feeling well this morning. Has had some mild abdominal pain but overall improved. Denies shortness of breath. She feels well and is asking when she might be able to leave.   Objective:  Vital signs in last 24 hours: Vitals:   10/08/19 1710 10/08/19 1744 10/08/19 2036 10/09/19 0605  BP: 132/85 137/90 (!) 146/87 129/88  Pulse: 88 90 88 85  Resp: 18  18 17   Temp: 98.2 F (36.8 C) 98.2 F (36.8 C) 98.1 F (36.7 C) 98.2 F (36.8 C)  TempSrc: Oral Oral Oral Oral  SpO2: 98% 98% 100% 100%  Weight: 70.7 kg  70.7 kg   Height:       Constitution: NAD, walking around room Cardio: RRR, no m/r/g, no LE edema  Respiratory: CTA, no w/r/r Abdominal: mild tenderness, soft, non-distended MSK: moving all extremities Neuro: normal affect, a&ox3 Skin: c/d/i, PD catheter site w/o erythema or warmth  Assessment/Plan:  Active Problems:   ESRD on peritoneal dialysis ([facility])   Peritonitis ([facility])   PD catheter dysfunction ([place])  54yo female with PMH ESRD on PD presenting with nausea and vomiting, found to have acinetobacter peritonitis.   Acinetobacter Peritonitis  Improving since switching to IV merrem from ceftazidime, day 5 of treatment. She will need to complete 14 days total and will switch back to intraperitoneal fortaz at discharge. PD neutrophil counts have been decreasing daily. Peritoneal culture 3/06 NGTD at 24 hours.   - discuss with ID for timing of IV merrem, appreciate recommendations  - f/u nephrology recommendations  - cont. merrem today  ESRD on PD Previously having difficulty accessing PD at home but now functioning well within the hospital. Receiving PD daily.  - cont. K[place]-con   VTE: heparin IVF: none Diet: renal fluid restriction  Code: full  Dispo: Anticipated discharge today or tomorrow   Marty Heck, DO 10/09/2019, 6:54 AM Pager: [phone]

## 2019-10-09 NOTE — Progress Notes (Signed)
Pharmacy Antibiotic Note  Ruth Gutierrez is a 54 y.o. female admitted on 10/03/2019 with peritonitis.  Patient continues on IV Merrem per ID recommendations.  Today is abx d#6/14.  ESRD on PD ([facility]). Patient states she does not make urine.   Plan: Continue Meropenem 500mg  IV q24h  Monitor clinical progress, c/s, renal plans   Height: 5\' 7"  (170.2 cm) Weight: 155 lb 13.8 oz (70.7 kg) IBW/kg (Calculated) : 61.6  Temp (24hrs), Avg:98.2 F (36.8 C), Min:98.1 F (36.7 C), Max:98.2 F (36.8 C)  Recent Labs  Lab 10/05/19 0536 10/06/19 0338 10/07/19 0608 10/08/19 0440 10/09/19 0537  WBC 8.9 9.2 10.0 8.1 7.8  CREATININE 13.61* 13.07* 12.88* 12.06* 11.38*  VANCORANDOM  --  37  --   --   --     Estimated Creatinine Clearance: 5.6 mL/min (A) (by C-G formula based on SCr of 11.38 mg/dL (H)).    Allergies  Allergen Reactions  . Penicillin G Itching and Rash  . Penicillins Itching and Rash    Did it involve swelling of the face/tongue/throat, SOB, or low BP?Y Did it involve sudden or severe rash/hives, skin peeling, or any reaction on the inside of your mouth or nose? Y Did you need to seek medical attention at a hospital or doctor's office? Y When did it last happen?2016 If all above answers are "NO", may proceed with cephalosporin use.    [employer], Pharm.D., BCPS Clinical Pharmacist Clinical phone for 10/09/2019 from 8:30-4:00 is [phone].  **Pharmacist phone directory can be found on [email or web address] listed under [place].  10/09/2019 10:16 AM

## 2019-10-09 NOTE — Plan of Care (Signed)
  Problem: Bowel/Gastric: Goal: Complications associated with peritonitis will be avoided or minimized Outcome: Progressing   Problem: Coping: Goal: Ability to cope with  disease diagnosis and complications will improve Outcome: Progressing

## 2019-10-10 DIAGNOSIS — Z992 Dependence on renal dialysis: Secondary | ICD-10-CM | POA: Diagnosis not present

## 2019-10-10 DIAGNOSIS — N186 End stage renal disease: Secondary | ICD-10-CM | POA: Diagnosis not present

## 2019-10-10 DIAGNOSIS — D631 Anemia in chronic kidney disease: Secondary | ICD-10-CM | POA: Diagnosis not present

## 2019-10-10 DIAGNOSIS — K659 Peritonitis, unspecified: Secondary | ICD-10-CM | POA: Diagnosis not present

## 2019-10-10 DIAGNOSIS — Z79899 Other long term (current) drug therapy: Secondary | ICD-10-CM | POA: Diagnosis not present

## 2019-10-10 DIAGNOSIS — K650 Generalized (acute) peritonitis: Secondary | ICD-10-CM | POA: Diagnosis not present

## 2019-10-10 LAB — RENAL FUNCTION PANEL
Albumin: 1.8 g/dL — ABNORMAL LOW (ref 3.5–5.0)
Anion gap: 13 (ref 5–15)
BUN: 40 mg/dL — ABNORMAL HIGH (ref 6–20)
CO2: 25 mmol/L (ref 22–32)
Calcium: 7.4 mg/dL — ABNORMAL LOW (ref 8.9–10.3)
Chloride: 97 mmol/L — ABNORMAL LOW (ref 98–111)
Creatinine, Ser: 10.67 mg/dL — ABNORMAL HIGH (ref 0.44–1.00)
GFR calc Af Amer: 4 mL/min — ABNORMAL LOW (ref >60)
GFR calc non Af Amer: 4 mL/min — ABNORMAL LOW (ref >60)
Glucose, Bld: 127 mg/dL — ABNORMAL HIGH (ref 70–99)
Phosphorus: 3.3 mg/dL (ref 2.5–4.6)
Potassium: 4.1 mmol/L (ref 3.5–5.1)
Sodium: 135 mmol/L (ref 135–145)

## 2019-10-10 LAB — BODY FLUID CELL COUNT WITH DIFFERENTIAL: Total Nucleated Cell Count, Fluid: 7 cu mm (ref 0–1000)

## 2019-10-10 LAB — CBC
HCT: 38.5 % (ref 36.0–46.0)
Hemoglobin: 13.1 g/dL (ref 12.0–15.0)
MCH: 32.8 pg (ref 26.0–34.0)
MCHC: 34 g/dL (ref 30.0–36.0)
MCV: 96.3 fL (ref 80.0–100.0)
Platelets: 188 10*3/uL (ref 150–400)
RBC: 4 MIL/uL (ref 3.87–5.11)
RDW: 13.5 % (ref 11.5–15.5)
WBC: 7.5 10*3/uL (ref 4.0–10.5)
nRBC: 0 % (ref 0.0–0.2)

## 2019-10-10 MED ORDER — POTASSIUM CHLORIDE CRYS ER 20 MEQ PO TBCR: 40.0000 meq | EXTENDED_RELEASE_TABLET | Freq: Every day | ORAL | 0 refills | Status: DC

## 2019-10-10 MED ORDER — POLYETHYLENE GLYCOL 3350 17 G PO PACK: 17.0000 g | PACK | Freq: Every day | ORAL | 0 refills | Status: DC

## 2019-10-10 NOTE — Discharge Summary (Signed)
Name: Ruth Gutierrez MRN: [id number] DOB: December 02, 1965 54 y.o. PCP: Rexene Agent, MD  Date of Admission: 10/03/2019 10:52 PM Date of Discharge: 10/10/2019 Attending Physician: Lucious Groves, DO  Discharge Diagnosis: Active Problems:   ESRD on peritoneal dialysis ([place])   Peritonitis ([place])   PD catheter dysfunction ([place])   Infection due to acinetobacter baumannii  Discharge Medications: Allergies as of 10/10/2019      Reactions   Penicillin G Itching, Rash   Penicillins Itching, Rash   Did it involve swelling of the face/tongue/throat, SOB, or low BP?Y Did it involve sudden or severe rash/hives, skin peeling, or any reaction on the inside of your mouth or nose? Y Did you need to seek medical attention at a hospital or doctor's office? Y When did it last happen?2016 If all above answers are "NO", may proceed with cephalosporin use.      Medication List    TAKE these medications   calcitRIOL 0.5 MCG capsule Commonly known as: ROCALTROL Take 1 capsule (0.5 mcg total) by mouth every Monday, Wednesday, and Friday with hemodialysis.   cinacalcet 90 MG tablet Commonly known as: SENSIPAR Take 90 mg by mouth daily.   diphenhydrAMINE 25 MG tablet Commonly known as: SOMINEX Take 25 mg by mouth daily as needed.   gentamicin cream 0.1 % Commonly known as: GARAMYCIN Apply 1 application topically daily.   polyethylene glycol 17 g packet Commonly known as: MIRALAX / GLYCOLAX Take 17 g by mouth daily. Start taking on: October 11, 2019   potassium chloride SA 20 MEQ tablet Commonly known as: KLOR-CON Take 2 tablets (40 mEq total) by mouth daily. Start taking on: October 11, 2019   Velphoro 500 MG chewable tablet Generic drug: sucroferric oxyhydroxide Chew 500 mg by mouth 3 (three) times daily with meals.       Disposition and follow-up:   Ruth Gutierrez was discharged from [facility] in Stable condition.  At the hospital follow up visit  please address:  1.  Acinetobacter Peritonitis - pt to continue IP ceftazidime for 7 more days (ending on 3/15) to complete a total 14 day course - ensure pt's symptoms continue to resolve and there's no evidence for abscess or loculation  ESRD and chronic hypokalemia - continue outpatient PD regiment - restart 78mEq PO K supplementation daily  2.  Labs / imaging needed at time of follow-up: renal function panels with PD  3.  Pending labs/ test needing follow-up: follow-up repeat peritoneal fluid culture from 3/6 to ensure no growth  Follow-up Appointments: Pt to continue follow-up with nephrology outpatient  Hospital Course by problem list: 1. [name] is a 54 year old F with significant PMH of ESRD on PD, who presented for abdominal pain and difficulty flushing the PD catheter at home concerning for peritonitis. Pt initially started on IV vanc and IP ceftaz. Peritoneal cell count on 3/2 elevated to 8,127 with neutrophilic predominance, and subsequent culture grew few acinetobacter calcoaceticus/baumannii. Pt was narrowed to ceftazidime on hospital day 2. On hospital day 3, sensitives resulted with acinetobacter sensitive to ceftazidime, though with high MIC. At this time, pt was still having residual pain and nausea/vomiting. Antibiotic regimen was switched to IV meropenem 500mg  daily on 10/06/2019. Pt began to clinically improve the following day. The peritoneal cell count downtrended after initiation of antibiotics (2,230 >> 325 >> 36 >> 28 >> 7). Infectious disease was consulted for the pt and recommended IV meropenem while hospitalized and IP ceftazidime  upon discharge to complete a 14 day course. Per ID, since first episode, infection can generally be eradicated and PD catheter can remain in place. Repeat peritoneal fluid samples were cultured on 10/08/2019 to ensure sterility and showed no growth in 2 days by time of discharge. Pt was discharge on hospital day 7 after receiving IV  meropenem to complete day 7 of antibiotics. She was discharged with instructions to complete the final 7 days with IP ceftazidime at home, end date 10/17/2019.  Throughout her admission, pt was treated with Norco 7.5-325mg  for pain control, PRN compazine for nausea, and Miralax/lactulose for constipation. Pt did not require these PRNs on the day of discharge.  Discharge Vitals:   BP 137/89 (BP Location: Right Arm)   Pulse 97   Temp 98.2 F (36.8 C) (Oral)   Resp 18   Ht 5\' 7"  (1.702 m)   Wt 73.5 kg   LMP 06/02/2015   SpO2 100%   BMI 25.38 kg/m   Pertinent Labs, Studies, and Procedures:  CBC Latest Ref Rng & Units 10/10/2019 10/09/2019 10/08/2019  WBC 4.0 - 10.5 K/uL 7.5 7.8 8.1  Hemoglobin 12.0 - 15.0 g/dL 13.1 13.6 13.3  Hematocrit 36.0 - 46.0 % 38.5 39.8 39.7  Platelets 150 - 400 K/uL 188 193 195   BMP Latest Ref Rng & Units 10/10/2019 10/09/2019 10/08/2019  Glucose 70 - 99 mg/dL 127(H) 81 100(H)  BUN 6 - 20 mg/dL 40(H) 46(H) 56(H)  Creatinine 0.44 - 1.00 mg/dL 10.67(H) 11.38(H) 12.06(H)  Sodium 135 - 145 mmol/L 135 134(L) 135  Potassium 3.5 - 5.1 mmol/L 4.1 4.3 3.8  Chloride 98 - 111 mmol/L 97(L) 95(L) 93(L)  CO2 22 - 32 mmol/L 25 25 25   Calcium 8.9 - 10.3 mg/dL 7.4(L) 7.6(L) 7.8(L)   Recent Results (from the past 240 hour(s))  Blood culture (routine x 2)     Status: None   Collection Time: 10/04/19  5:24 AM   Specimen: BLOOD  Result Value Ref Range Status   Specimen Description BLOOD RIGHT ANTECUBITAL  Final   Special Requests   Final    BOTTLES DRAWN AEROBIC AND ANAEROBIC Blood Culture adequate volume   Culture   Final    NO GROWTH 5 DAYS Performed at [facility], [address] [place], [place] [address]    Report Status 10/09/2019 FINAL  Final  Blood culture (routine x 2)     Status: None   Collection Time: 10/04/19  5:25 AM   Specimen: BLOOD RIGHT FOREARM  Result Value Ref Range Status   Specimen Description BLOOD RIGHT FOREARM  Final   Special Requests   Final      BOTTLES DRAWN AEROBIC AND ANAEROBIC Blood Culture adequate volume   Culture   Final    NO GROWTH 5 DAYS Performed at [facility], [place] [address], [place], [place] [address]    Report Status 10/09/2019 FINAL  Final  Respiratory Panel by RT PCR (Flu A&B, Covid) - Nasopharyngeal Swab     Status: None   Collection Time: 10/04/19  5:30 AM   Specimen: Nasopharyngeal Swab  Result Value Ref Range Status   SARS Coronavirus 2 by RT PCR NEGATIVE NEGATIVE Final    Comment: (NOTE) SARS-CoV-2 target nucleic acids are NOT DETECTED. The SARS-CoV-2 RNA is generally detectable in upper respiratoy specimens during the acute phase of infection. The lowest concentration of SARS-CoV-2 viral copies this assay can detect is 131 copies/mL. A negative result does not preclude SARS-Cov-2  infection and should not be used as the sole basis for treatment or other patient management decisions. A negative result may occur with  improper specimen collection/handling, submission of specimen other than nasopharyngeal swab, presence of viral mutation(s) within the areas targeted by this assay, and inadequate number of viral copies (<131 copies/mL). A negative result must be combined with clinical observations, patient history, and epidemiological information. The expected result is Negative. Fact Sheet for Patients:  PinkCheek.be Fact Sheet for Healthcare Providers:  GravelBags.it This test is not yet ap proved or cleared by the [place] FDA and  has been authorized for detection and/or diagnosis of SARS-CoV-2 by FDA under an Emergency Use Authorization (EUA). This EUA will remain  in effect (meaning this test can be used) for the duration of the COVID-19 declaration under Section 564(b)(1) of the Act, 21 U.S.C. section 360bbb-3(b)(1), unless the authorization is terminated or revoked sooner.    Influenza A by PCR NEGATIVE NEGATIVE Final    Influenza B by PCR NEGATIVE NEGATIVE Final    Comment: (NOTE) The Xpert Xpress SARS-CoV-2/FLU/RSV assay is intended as an aid in  the diagnosis of influenza from Nasopharyngeal swab specimens and  should not be used as a sole basis for treatment. Nasal washings and  aspirates are unacceptable for Xpert Xpress SARS-CoV-2/FLU/RSV  testing. Fact Sheet for Patients: PinkCheek.be Fact Sheet for Healthcare Providers: GravelBags.it This test is not yet approved or cleared by the [place] FDA and  has been authorized for detection and/or diagnosis of SARS-CoV-2 by  FDA under an Emergency Use Authorization (EUA). This EUA will remain  in effect (meaning this test can be used) for the duration of the  Covid-19 declaration under Section 564(b)(1) of the Act, 21  U.S.C. section 360bbb-3(b)(1), unless the authorization is  terminated or revoked. Performed at [facility], [place] [address] [place], [place] [address]   Body fluid culture     Status: None   Collection Time: 10/04/19 11:00 AM   Specimen: Peritoneal Washings; Body Fluid  Result Value Ref Range Status   Specimen Description PERITONEAL DIALYSIS  Final   Special Requests NONE  Final   Gram Stain   Final    ABUNDANT WBC PRESENT,BOTH PMN AND MONONUCLEAR NO ORGANISMS SEEN Performed at [facility], [address], [place], [place] [address]    Culture FEW ACINETOBACTER CALCOACETICUS/BAUMANNII COMPLEX  Final   Report Status 10/06/2019 FINAL  Final   Organism ID, Bacteria ACINETOBACTER CALCOACETICUS/BAUMANNII COMPLEX  Final      Susceptibility   Acinetobacter calcoaceticus/baumannii complex - MIC*    CEFTAZIDIME 4 SENSITIVE Sensitive     CIPROFLOXACIN <=0.25 SENSITIVE Sensitive     GENTAMICIN <=1 SENSITIVE Sensitive     IMIPENEM <=0.25 SENSITIVE Sensitive     PIP/TAZO >=128 RESISTANT Resistant     TRIMETH/SULFA >=320 RESISTANT Resistant     AMPICILLIN/SULBACTAM  4 SENSITIVE Sensitive     * FEW ACINETOBACTER CALCOACETICUS/BAUMANNII COMPLEX  Culture, fungus without smear     Status: None (Preliminary result)   Collection Time: 10/04/19 11:00 AM   Specimen: Peritoneal Washings; Other  Result Value Ref Range Status   Specimen Description PERITONEAL DIALYSIS  Final   Special Requests NONE  Final   Culture   Final    NO FUNGUS ISOLATED AFTER 6 DAYS Performed at [facility], [address], [place], [place] [address]    Report Status PENDING  Incomplete  Body fluid culture     Status: None (  Preliminary result)   Collection Time: 10/08/19  9:15 AM   Specimen: Peritoneal Washings; Body Fluid  Result Value Ref Range Status   Specimen Description PERITONEAL  Final   Special Requests NONE  Final   Gram Stain   Final    CYTOSPIN SMEAR WBC PRESENT, PREDOMINANTLY MONONUCLEAR NO ORGANISMS SEEN    Culture   Final    NO GROWTH 2 DAYS Performed at [facility], [address] [place], [place] [address]    Report Status PENDING  Incomplete   DG Abdomen Acute W/Chest CLINICAL DATA:  Abdominal pain  EXAM: DG ABDOMEN ACUTE W/ 1V CHEST  COMPARISON:  April 05, 2013  FINDINGS: There is no evidence of dilated bowel loops or free intraperitoneal air. A right-sided peritoneal dialysis catheter is coiled within the deep pelvis. Partially calcified uterine fibroids are seen. Heart size and mediastinal contours are within normal limits. Both lungs are clear.  IMPRESSION: Nonobstructive bowel gas pattern.  No acute cardiopulmonary disease.  Electronically Signed   By: Prudencio Pair M.D.   On: 10/04/2019 06:22  Discharge Instructions: Discharge Instructions    Call MD for:   Complete by: As directed    Persistent abdominal pain or difficulty flushing your PD catheter.   Call MD for:  extreme fatigue   Complete by: As directed    Call MD for:  persistant dizziness or light-headedness   Complete by: As directed    Call MD for:   persistant nausea and vomiting   Complete by: As directed    Call MD for:  redness, tenderness, or signs of infection (pain, swelling, redness, odor or green/yellow discharge around incision site)   Complete by: As directed    Around catheter site.   Call MD for:  severe uncontrolled pain   Complete by: As directed    Call MD for:  temperature >100.4   Complete by: As directed    Diet - low sodium heart healthy   Complete by: As directed    Discharge instructions   Complete by: As directed    Ms. Gutierrez,  You were admitted to the hospital for an infection of the fluid around your abdomen called peritonitis. This is common in patients on peritoneal dialysis. You were treated with 7 days of antibiotics and medications to help your abdominal pain and nausea. For the next 7 days, you will continue antibiotics at home through your dialysate bag (for a total course of 14 days). If you develop recurring or persistent abdominal pain, nausea/vomiting, or have difficulty flushing the catheter at home, then please contact your nephrologist.  Please start taking potassium chloride 40 mEq once per day.   Thank you for letting us be a part of your care!   Increase activity slowly   Complete by: As directed       Signed: Ladona Horns, MD 10/10/2019, 11:41 AM   Pager: [phone]

## 2019-10-10 NOTE — Progress Notes (Signed)
DISCHARGE NOTE HOME Ruth Gutierrez to be discharged Home per MD order. Discussed prescriptions and follow up appointments with the patient. Prescriptions given to patient; medication list explained in detail. Patient verbalized understanding.  Skin clean, dry and intact without evidence of skin break down, no evidence of skin tears noted. IV catheter discontinued intact. Site without signs and symptoms of complications. Dressing and pressure applied. Pt denies pain at the site currently. No complaints noted.  Patient free of lines, drains, and wounds.   An After Visit Summary (AVS) was printed and given to the patient. Patient escorted via wheelchair, and discharged home via private auto.  Arlyss Repress, RN

## 2019-10-10 NOTE — Progress Notes (Signed)
[facility] Progress Note   Subjective:   Patient seen in room. Abdominal pain is resolved. Denies SOB, CP, palpitations, dizziness, N/V. Constipation resolved. Wants to go home.   Objective Vitals:   10/09/19 1807 10/09/19 2102 10/10/19 0556 10/10/19 0906  BP: (!) 146/92 (!) 161/99 (!) 168/99 137/89  Pulse: 89 91 86 97  Resp: 18 17 18 18   Temp: 98.6 F (37 C) 98.2 F (36.8 C) 98 F (36.7 C) 98.2 F (36.8 C)  TempSrc: Oral Oral  Oral  SpO2: 98% 100% 100% 100%  Weight: 73.5 kg 73.5 kg    Height:       Physical Exam General:Well developed,well nourished female in NAD Heart:RRR. No murmurs, rubs or gallops Lungs:CTA bilaterally without wheezing, rhonchi or rales Abdomen:Soft, non-distended,mildlytender to palpationLLQ. +BS, PD cath without erythema/drainage Extremities:No edema b/l lower extremities Dialysis Access:PD cathwithout surround erythema/drainage,LUE AVF + thrill. PD bags at bedside with clear straw colored fluid.  Additional Objective Labs: Basic Metabolic Panel: Recent Labs  Lab 10/08/19 0440 10/09/19 0537 10/10/19 0441  NA 135 134* 135  K 3.8 4.3 4.1  CL 93* 95* 97*  CO2 25 25 25   GLUCOSE 100* 81 127*  BUN 56* 46* 40*  CREATININE 12.06* 11.38* 10.67*  CALCIUM 7.8* 7.6* 7.4*  PHOS 3.9 3.0 3.3   Liver Function Tests: Recent Labs  Lab 10/08/19 0440 10/09/19 0537 10/10/19 0441  ALBUMIN 1.9* 2.0* 1.8*   CBC: Recent Labs  Lab 10/04/19 0500 10/05/19 0536 10/06/19 0338 10/06/19 0338 10/07/19 0608 10/07/19 0608 10/08/19 0440 10/09/19 0537 10/10/19 0441  WBC 11.4*   < > 9.2   < > 10.0   < > 8.1 7.8 7.5  NEUTROABS 9.5*  --   --   --   --   --   --   --   --   HGB 13.6   < > 14.5   < > 13.4   < > 13.3 13.6 13.1  HCT 40.4   < > 42.3   < > 39.0   < > 39.7 39.8 38.5  MCV 97.3   < > 96.1  --  95.6  --  96.4 94.1 96.3  PLT 161   < > 158   < > 175   < > 195 193 188   < > = values in this interval not displayed.   Blood  Culture    Component Value Date/Time   SDES PERITONEAL 10/08/2019 0915   SPECREQUEST NONE 10/08/2019 0915   CULT  10/08/2019 0915    NO GROWTH 2 DAYS Performed at [facility], [place] [address], [place], [place] [address]    REPTSTATUS PENDING 10/08/2019 0915    Medications: . sodium chloride 500 mL (10/05/19 0846)  . dialysis solution 1.5% low-MG/low-CA    . dialysis solution 2.5% low-MG/low-CA    . meropenem (MERREM) IV Stopped (10/09/19 0913)   . cinacalcet  180 mg Oral Q supper  . heparin  5,000 Units Subcutaneous Q8H  . polyethylene glycol  17 g Oral Daily  . potassium chloride  40 mEq Oral Daily  . sucroferric oxyhydroxide  1,500 mg Oral TID WC    Dialysis Orders: Center:[facility] Followed by Dr. Joelyn Oms. 7X Week, EDW 76kg,Dextrose 1.5; 2.5; 4.25 %, # Exchanges 6, fill vol 3000 mL, Dwell Time 1 hrs 15 min, last fill vol 1500 mL, Day Exchanges 1, daytime fill vol 2000 mL, Day Dwell Time 3 hrs 0 min Uses 3 exchanges with 1.5% and  1 exchange 2.5% dextrose  Assessment/Plan: 1. Peritonitis:Unable to fill or drain fluid since 09/30/19 with + cloudy fluid and abdominal pain.  Highcell count in peritoneal fluid.Fortunately have been able to continue PD here. Given IV vanc and fortaz> IP antibiotics.Peritoneal culture growing acinetobacter> started meropenem IV.Cell count improved to 28, fluid clear, repeat culture 3/6 NGTD. Appreciate infectious disease input, recommending continuing meropenem until d/c then finishing course with IP ceftaz for a total of 14 days (on day 7 today)- can continue antibiotics outpatient.  2. ESRD: Continue PD with heparinized bags. Does have AVF for back up if HD is needed, not indicated at this time. 3. Hypokalemia:K+ 3.0on presentation. Pt is supposed to be taking KCl daily but reports poor compliance,improved to 4.1today. Potassium chloride 40 mEqincreased to BID> back to QD. 4. Constipation:Resolved,lactulose decreased to  PRN 5. Hypertension/volume:Well below last outpatient EDW. BPminimally elevated. Reported some SOB yesterday, will increase to 3 exchanges with 2.5%, 2 exchanges with 1.5%, SOB now resolved.  6. Anemia:Hgb13.6. Not on outpatient ESA and none indicated at present. 7. Metabolic bone disease:Last iPTH 1128, on max sensipar and calcitriol 0.94mcg daily however has questionable compliance.Corrected calcium highpreviously, calcitriolheld, Corr Ca now 9.2, resume calcitriol. Continue sensipar.Phos 6.0> 3.9 > 3.3.Continue outpatient binder (velphoro). 8. Dispo: Symptoms improved, ok to d/c from renal standpoint, can continue IP antibiotics outpatient.  Anice Paganini, PA-C 10/10/2019, 10:45 AM  [facility] Pager: [phone]

## 2019-10-10 NOTE — Progress Notes (Signed)
   Subjective: Pt seen at the bedside this morning with her mother. States she is feeling well and very much interested in going home today. Denies abdominal pain, nausea, or vomiting. Instructed pt that she will continue her antibiotic treatment with ceftazidime through her PD sessions at home for another 7 days. Advised her to call her nephrologist if she develops fevers/chills, persistent abdominal pain, nausea/vomiting, or difficulty flushing her catheter at home.  Objective:  Vital signs in last 24 hours: Vitals:   10/09/19 1617 10/09/19 1807 10/09/19 2102 10/10/19 0556  BP: (!) 142/89 (!) 146/92 (!) 161/99 (!) 168/99  Pulse: 89 89 91 86  Resp: 18 18 17 18   Temp: 98.5 F (36.9 C) 98.6 F (37 C) 98.2 F (36.8 C) 98 F (36.7 C)  TempSrc: Oral Oral Oral   SpO2: 100% 98% 100% 100%  Weight:  73.5 kg 73.5 kg   Height:       Physical Exam Vitals and nursing note reviewed.  Constitutional:      General: She is not in acute distress.    Appearance: She is not ill-appearing.  Cardiovascular:     Rate and Rhythm: Normal rate and regular rhythm.  Pulmonary:     Effort: Pulmonary effort is normal.  Abdominal:     General: Abdomen is flat. Bowel sounds are normal.     Palpations: Abdomen is soft.  Neurological:     Mental Status: She is alert.    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  Abdominal Pain Acinetobacter Peritonitis Abdominal pain, nausea, and vomiting resolved. Initial cell count from peritoneal fluid on 3/2 elevated with neutrophilic predominance and few acinetobacter calcoaceticus/baumannii. - cell count continues to improve 7 << 28 << 36 << 325 << 2,230 -  repeat peritoneal culture from 3/6 with NGTD - patient received IV meropenem today completing day 7 of antibiotics - will continue with IP ceftazidime outpatient for 7 days to complete a total 14 day course - per ID, since first episode, infection can generally be eradicated and PD catheter can remain in place  ESRD on PD Nephrology consulted, appreciate recommendations - PD going well during pt's hospitalization - labs stable - continue 51mEq PO KCl daily supplementation   Prior to Admission Living Arrangement: home Anticipated Discharge Location: home Barriers to Discharge: outpatient IP antibiotics Dispo: Anticipated discharge today.  Ruth Horns, MD 10/10/2019, 6:14 AM Pager: [phone]

## 2019-10-11 DIAGNOSIS — Z79899 Other long term (current) drug therapy: Secondary | ICD-10-CM | POA: Diagnosis not present

## 2019-10-11 DIAGNOSIS — N186 End stage renal disease: Secondary | ICD-10-CM | POA: Diagnosis not present

## 2019-10-11 DIAGNOSIS — K650 Generalized (acute) peritonitis: Secondary | ICD-10-CM | POA: Diagnosis not present

## 2019-10-11 DIAGNOSIS — K659 Peritonitis, unspecified: Secondary | ICD-10-CM | POA: Diagnosis not present

## 2019-10-11 DIAGNOSIS — D631 Anemia in chronic kidney disease: Secondary | ICD-10-CM | POA: Diagnosis not present

## 2019-10-11 DIAGNOSIS — Z992 Dependence on renal dialysis: Secondary | ICD-10-CM | POA: Diagnosis not present

## 2019-10-11 LAB — BODY FLUID CULTURE: Culture: NO GROWTH

## 2019-10-11 LAB — PATHOLOGIST SMEAR REVIEW

## 2019-10-12 DIAGNOSIS — D631 Anemia in chronic kidney disease: Secondary | ICD-10-CM | POA: Diagnosis not present

## 2019-10-12 DIAGNOSIS — Z992 Dependence on renal dialysis: Secondary | ICD-10-CM | POA: Diagnosis not present

## 2019-10-12 DIAGNOSIS — N186 End stage renal disease: Secondary | ICD-10-CM | POA: Diagnosis not present

## 2019-10-12 DIAGNOSIS — K659 Peritonitis, unspecified: Secondary | ICD-10-CM | POA: Diagnosis not present

## 2019-10-12 DIAGNOSIS — K650 Generalized (acute) peritonitis: Secondary | ICD-10-CM | POA: Diagnosis not present

## 2019-10-12 DIAGNOSIS — Z79899 Other long term (current) drug therapy: Secondary | ICD-10-CM | POA: Diagnosis not present

## 2019-10-12 LAB — PATHOLOGIST SMEAR REVIEW

## 2019-10-13 DIAGNOSIS — D631 Anemia in chronic kidney disease: Secondary | ICD-10-CM | POA: Diagnosis not present

## 2019-10-13 DIAGNOSIS — K650 Generalized (acute) peritonitis: Secondary | ICD-10-CM | POA: Diagnosis not present

## 2019-10-13 DIAGNOSIS — Z79899 Other long term (current) drug therapy: Secondary | ICD-10-CM | POA: Diagnosis not present

## 2019-10-13 DIAGNOSIS — K659 Peritonitis, unspecified: Secondary | ICD-10-CM | POA: Diagnosis not present

## 2019-10-13 DIAGNOSIS — N186 End stage renal disease: Secondary | ICD-10-CM | POA: Diagnosis not present

## 2019-10-13 DIAGNOSIS — Z992 Dependence on renal dialysis: Secondary | ICD-10-CM | POA: Diagnosis not present

## 2019-10-14 DIAGNOSIS — Z79899 Other long term (current) drug therapy: Secondary | ICD-10-CM | POA: Diagnosis not present

## 2019-10-14 DIAGNOSIS — K650 Generalized (acute) peritonitis: Secondary | ICD-10-CM | POA: Diagnosis not present

## 2019-10-14 DIAGNOSIS — K659 Peritonitis, unspecified: Secondary | ICD-10-CM | POA: Diagnosis not present

## 2019-10-14 DIAGNOSIS — Z992 Dependence on renal dialysis: Secondary | ICD-10-CM | POA: Diagnosis not present

## 2019-10-14 DIAGNOSIS — N186 End stage renal disease: Secondary | ICD-10-CM | POA: Diagnosis not present

## 2019-10-14 DIAGNOSIS — D631 Anemia in chronic kidney disease: Secondary | ICD-10-CM | POA: Diagnosis not present

## 2019-10-15 DIAGNOSIS — N186 End stage renal disease: Secondary | ICD-10-CM | POA: Diagnosis not present

## 2019-10-15 DIAGNOSIS — K650 Generalized (acute) peritonitis: Secondary | ICD-10-CM | POA: Diagnosis not present

## 2019-10-15 DIAGNOSIS — Z79899 Other long term (current) drug therapy: Secondary | ICD-10-CM | POA: Diagnosis not present

## 2019-10-15 DIAGNOSIS — K659 Peritonitis, unspecified: Secondary | ICD-10-CM | POA: Diagnosis not present

## 2019-10-15 DIAGNOSIS — Z992 Dependence on renal dialysis: Secondary | ICD-10-CM | POA: Diagnosis not present

## 2019-10-15 DIAGNOSIS — D631 Anemia in chronic kidney disease: Secondary | ICD-10-CM | POA: Diagnosis not present

## 2019-10-16 DIAGNOSIS — Z79899 Other long term (current) drug therapy: Secondary | ICD-10-CM | POA: Diagnosis not present

## 2019-10-16 DIAGNOSIS — K659 Peritonitis, unspecified: Secondary | ICD-10-CM | POA: Diagnosis not present

## 2019-10-16 DIAGNOSIS — D631 Anemia in chronic kidney disease: Secondary | ICD-10-CM | POA: Diagnosis not present

## 2019-10-16 DIAGNOSIS — K650 Generalized (acute) peritonitis: Secondary | ICD-10-CM | POA: Diagnosis not present

## 2019-10-16 DIAGNOSIS — Z992 Dependence on renal dialysis: Secondary | ICD-10-CM | POA: Diagnosis not present

## 2019-10-16 DIAGNOSIS — N186 End stage renal disease: Secondary | ICD-10-CM | POA: Diagnosis not present

## 2019-10-17 DIAGNOSIS — N186 End stage renal disease: Secondary | ICD-10-CM | POA: Diagnosis not present

## 2019-10-17 DIAGNOSIS — Z79899 Other long term (current) drug therapy: Secondary | ICD-10-CM | POA: Diagnosis not present

## 2019-10-17 DIAGNOSIS — K650 Generalized (acute) peritonitis: Secondary | ICD-10-CM | POA: Diagnosis not present

## 2019-10-17 DIAGNOSIS — K659 Peritonitis, unspecified: Secondary | ICD-10-CM | POA: Diagnosis not present

## 2019-10-17 DIAGNOSIS — Z992 Dependence on renal dialysis: Secondary | ICD-10-CM | POA: Diagnosis not present

## 2019-10-17 DIAGNOSIS — D631 Anemia in chronic kidney disease: Secondary | ICD-10-CM | POA: Diagnosis not present

## 2019-10-18 DIAGNOSIS — K659 Peritonitis, unspecified: Secondary | ICD-10-CM | POA: Diagnosis not present

## 2019-10-18 DIAGNOSIS — N186 End stage renal disease: Secondary | ICD-10-CM | POA: Diagnosis not present

## 2019-10-18 DIAGNOSIS — K650 Generalized (acute) peritonitis: Secondary | ICD-10-CM | POA: Diagnosis not present

## 2019-10-18 DIAGNOSIS — Z992 Dependence on renal dialysis: Secondary | ICD-10-CM | POA: Diagnosis not present

## 2019-10-18 DIAGNOSIS — D631 Anemia in chronic kidney disease: Secondary | ICD-10-CM | POA: Diagnosis not present

## 2019-10-18 DIAGNOSIS — Z79899 Other long term (current) drug therapy: Secondary | ICD-10-CM | POA: Diagnosis not present

## 2019-10-19 DIAGNOSIS — N186 End stage renal disease: Secondary | ICD-10-CM | POA: Diagnosis not present

## 2019-10-19 DIAGNOSIS — Z79899 Other long term (current) drug therapy: Secondary | ICD-10-CM | POA: Diagnosis not present

## 2019-10-19 DIAGNOSIS — K650 Generalized (acute) peritonitis: Secondary | ICD-10-CM | POA: Diagnosis not present

## 2019-10-19 DIAGNOSIS — Z992 Dependence on renal dialysis: Secondary | ICD-10-CM | POA: Diagnosis not present

## 2019-10-19 DIAGNOSIS — K659 Peritonitis, unspecified: Secondary | ICD-10-CM | POA: Diagnosis not present

## 2019-10-19 DIAGNOSIS — D631 Anemia in chronic kidney disease: Secondary | ICD-10-CM | POA: Diagnosis not present

## 2019-10-20 DIAGNOSIS — K650 Generalized (acute) peritonitis: Secondary | ICD-10-CM | POA: Diagnosis not present

## 2019-10-20 DIAGNOSIS — K659 Peritonitis, unspecified: Secondary | ICD-10-CM | POA: Diagnosis not present

## 2019-10-20 DIAGNOSIS — N186 End stage renal disease: Secondary | ICD-10-CM | POA: Diagnosis not present

## 2019-10-20 DIAGNOSIS — Z992 Dependence on renal dialysis: Secondary | ICD-10-CM | POA: Diagnosis not present

## 2019-10-20 DIAGNOSIS — Z79899 Other long term (current) drug therapy: Secondary | ICD-10-CM | POA: Diagnosis not present

## 2019-10-20 DIAGNOSIS — D631 Anemia in chronic kidney disease: Secondary | ICD-10-CM | POA: Diagnosis not present

## 2019-10-21 DIAGNOSIS — N186 End stage renal disease: Secondary | ICD-10-CM | POA: Diagnosis not present

## 2019-10-21 DIAGNOSIS — Z992 Dependence on renal dialysis: Secondary | ICD-10-CM | POA: Diagnosis not present

## 2019-10-21 DIAGNOSIS — Z79899 Other long term (current) drug therapy: Secondary | ICD-10-CM | POA: Diagnosis not present

## 2019-10-21 DIAGNOSIS — D631 Anemia in chronic kidney disease: Secondary | ICD-10-CM | POA: Diagnosis not present

## 2019-10-21 DIAGNOSIS — K659 Peritonitis, unspecified: Secondary | ICD-10-CM | POA: Diagnosis not present

## 2019-10-21 DIAGNOSIS — K650 Generalized (acute) peritonitis: Secondary | ICD-10-CM | POA: Diagnosis not present

## 2019-10-22 DIAGNOSIS — K650 Generalized (acute) peritonitis: Secondary | ICD-10-CM | POA: Diagnosis not present

## 2019-10-22 DIAGNOSIS — Z79899 Other long term (current) drug therapy: Secondary | ICD-10-CM | POA: Diagnosis not present

## 2019-10-22 DIAGNOSIS — D631 Anemia in chronic kidney disease: Secondary | ICD-10-CM | POA: Diagnosis not present

## 2019-10-22 DIAGNOSIS — K659 Peritonitis, unspecified: Secondary | ICD-10-CM | POA: Diagnosis not present

## 2019-10-22 DIAGNOSIS — N186 End stage renal disease: Secondary | ICD-10-CM | POA: Diagnosis not present

## 2019-10-22 DIAGNOSIS — Z992 Dependence on renal dialysis: Secondary | ICD-10-CM | POA: Diagnosis not present

## 2019-10-23 DIAGNOSIS — N186 End stage renal disease: Secondary | ICD-10-CM | POA: Diagnosis not present

## 2019-10-23 DIAGNOSIS — Z79899 Other long term (current) drug therapy: Secondary | ICD-10-CM | POA: Diagnosis not present

## 2019-10-23 DIAGNOSIS — Z992 Dependence on renal dialysis: Secondary | ICD-10-CM | POA: Diagnosis not present

## 2019-10-23 DIAGNOSIS — K650 Generalized (acute) peritonitis: Secondary | ICD-10-CM | POA: Diagnosis not present

## 2019-10-23 DIAGNOSIS — D631 Anemia in chronic kidney disease: Secondary | ICD-10-CM | POA: Diagnosis not present

## 2019-10-23 DIAGNOSIS — K659 Peritonitis, unspecified: Secondary | ICD-10-CM | POA: Diagnosis not present

## 2019-10-24 DIAGNOSIS — Z992 Dependence on renal dialysis: Secondary | ICD-10-CM | POA: Diagnosis not present

## 2019-10-24 DIAGNOSIS — K659 Peritonitis, unspecified: Secondary | ICD-10-CM | POA: Diagnosis not present

## 2019-10-24 DIAGNOSIS — D631 Anemia in chronic kidney disease: Secondary | ICD-10-CM | POA: Diagnosis not present

## 2019-10-24 DIAGNOSIS — Z79899 Other long term (current) drug therapy: Secondary | ICD-10-CM | POA: Diagnosis not present

## 2019-10-24 DIAGNOSIS — N186 End stage renal disease: Secondary | ICD-10-CM | POA: Diagnosis not present

## 2019-10-24 DIAGNOSIS — K650 Generalized (acute) peritonitis: Secondary | ICD-10-CM | POA: Diagnosis not present

## 2019-10-25 DIAGNOSIS — Z79899 Other long term (current) drug therapy: Secondary | ICD-10-CM | POA: Diagnosis not present

## 2019-10-25 DIAGNOSIS — N186 End stage renal disease: Secondary | ICD-10-CM | POA: Diagnosis not present

## 2019-10-25 DIAGNOSIS — Z992 Dependence on renal dialysis: Secondary | ICD-10-CM | POA: Diagnosis not present

## 2019-10-25 DIAGNOSIS — D631 Anemia in chronic kidney disease: Secondary | ICD-10-CM | POA: Diagnosis not present

## 2019-10-25 DIAGNOSIS — K650 Generalized (acute) peritonitis: Secondary | ICD-10-CM | POA: Diagnosis not present

## 2019-10-25 DIAGNOSIS — K659 Peritonitis, unspecified: Secondary | ICD-10-CM | POA: Diagnosis not present

## 2019-10-25 LAB — CULTURE, FUNGUS WITHOUT SMEAR

## 2019-10-26 DIAGNOSIS — Z79899 Other long term (current) drug therapy: Secondary | ICD-10-CM | POA: Diagnosis not present

## 2019-10-26 DIAGNOSIS — K659 Peritonitis, unspecified: Secondary | ICD-10-CM | POA: Diagnosis not present

## 2019-10-26 DIAGNOSIS — N186 End stage renal disease: Secondary | ICD-10-CM | POA: Diagnosis not present

## 2019-10-26 DIAGNOSIS — K650 Generalized (acute) peritonitis: Secondary | ICD-10-CM | POA: Diagnosis not present

## 2019-10-26 DIAGNOSIS — Z992 Dependence on renal dialysis: Secondary | ICD-10-CM | POA: Diagnosis not present

## 2019-10-26 DIAGNOSIS — D631 Anemia in chronic kidney disease: Secondary | ICD-10-CM | POA: Diagnosis not present

## 2019-10-27 DIAGNOSIS — Z79899 Other long term (current) drug therapy: Secondary | ICD-10-CM | POA: Diagnosis not present

## 2019-10-27 DIAGNOSIS — D631 Anemia in chronic kidney disease: Secondary | ICD-10-CM | POA: Diagnosis not present

## 2019-10-27 DIAGNOSIS — K659 Peritonitis, unspecified: Secondary | ICD-10-CM | POA: Diagnosis not present

## 2019-10-27 DIAGNOSIS — K650 Generalized (acute) peritonitis: Secondary | ICD-10-CM | POA: Diagnosis not present

## 2019-10-27 DIAGNOSIS — N186 End stage renal disease: Secondary | ICD-10-CM | POA: Diagnosis not present

## 2019-10-27 DIAGNOSIS — Z992 Dependence on renal dialysis: Secondary | ICD-10-CM | POA: Diagnosis not present

## 2019-10-28 ENCOUNTER — Inpatient Hospital Stay (HOSPITAL_COMMUNITY): Payer: Medicare Other

## 2019-10-28 ENCOUNTER — Inpatient Hospital Stay (HOSPITAL_COMMUNITY)
Admission: EM | Admit: 2019-10-28 | Discharge: 2019-11-01 | DRG: 981 | Disposition: A | Discharge status: Home / Self Care | Payer: Medicare Other | Attending: Internal Medicine | Admitting: Internal Medicine

## 2019-10-28 ENCOUNTER — Emergency Department (HOSPITAL_COMMUNITY): Payer: Medicare Other

## 2019-10-28 ENCOUNTER — Encounter (HOSPITAL_COMMUNITY): Payer: Self-pay | Admitting: Pediatrics

## 2019-10-28 ENCOUNTER — Other Ambulatory Visit: Payer: Self-pay

## 2019-10-28 DIAGNOSIS — D631 Anemia in chronic kidney disease: Secondary | ICD-10-CM | POA: Diagnosis not present

## 2019-10-28 DIAGNOSIS — E876 Hypokalemia: Secondary | ICD-10-CM | POA: Diagnosis present

## 2019-10-28 DIAGNOSIS — I120 Hypertensive chronic kidney disease with stage 5 chronic kidney disease or end stage renal disease: Secondary | ICD-10-CM | POA: Diagnosis present

## 2019-10-28 DIAGNOSIS — R12 Heartburn: Secondary | ICD-10-CM | POA: Diagnosis present

## 2019-10-28 DIAGNOSIS — N186 End stage renal disease: Secondary | ICD-10-CM | POA: Diagnosis not present

## 2019-10-28 DIAGNOSIS — K219 Gastro-esophageal reflux disease without esophagitis: Secondary | ICD-10-CM | POA: Diagnosis present

## 2019-10-28 DIAGNOSIS — R000 Tachycardia, unspecified: Secondary | ICD-10-CM | POA: Diagnosis present

## 2019-10-28 DIAGNOSIS — Z8249 Family history of ischemic heart disease and other diseases of the circulatory system: Secondary | ICD-10-CM | POA: Diagnosis not present

## 2019-10-28 DIAGNOSIS — E441 Mild protein-calorie malnutrition: Secondary | ICD-10-CM | POA: Diagnosis present

## 2019-10-28 DIAGNOSIS — Z6825 Body mass index (BMI) 25.0-25.9, adult: Secondary | ICD-10-CM

## 2019-10-28 DIAGNOSIS — R188 Other ascites: Secondary | ICD-10-CM | POA: Diagnosis not present

## 2019-10-28 DIAGNOSIS — B9689 Other specified bacterial agents as the cause of diseases classified elsewhere: Secondary | ICD-10-CM | POA: Diagnosis not present

## 2019-10-28 DIAGNOSIS — Z992 Dependence on renal dialysis: Secondary | ICD-10-CM | POA: Diagnosis not present

## 2019-10-28 DIAGNOSIS — K652 Spontaneous bacterial peritonitis: Secondary | ICD-10-CM | POA: Diagnosis not present

## 2019-10-28 DIAGNOSIS — M1990 Unspecified osteoarthritis, unspecified site: Secondary | ICD-10-CM | POA: Diagnosis present

## 2019-10-28 DIAGNOSIS — A419 Sepsis, unspecified organism: Secondary | ICD-10-CM | POA: Diagnosis not present

## 2019-10-28 DIAGNOSIS — N250 Renal osteodystrophy: Secondary | ICD-10-CM | POA: Diagnosis not present

## 2019-10-28 DIAGNOSIS — E8889 Other specified metabolic disorders: Secondary | ICD-10-CM | POA: Diagnosis present

## 2019-10-28 DIAGNOSIS — N179 Acute kidney failure, unspecified: Secondary | ICD-10-CM | POA: Diagnosis not present

## 2019-10-28 DIAGNOSIS — T8571XA Infection and inflammatory reaction due to peritoneal dialysis catheter, initial encounter: Principal | ICD-10-CM | POA: Diagnosis present

## 2019-10-28 DIAGNOSIS — Z833 Family history of diabetes mellitus: Secondary | ICD-10-CM

## 2019-10-28 DIAGNOSIS — Z79899 Other long term (current) drug therapy: Secondary | ICD-10-CM

## 2019-10-28 DIAGNOSIS — Z940 Kidney transplant status: Secondary | ICD-10-CM

## 2019-10-28 DIAGNOSIS — N2581 Secondary hyperparathyroidism of renal origin: Secondary | ICD-10-CM | POA: Diagnosis not present

## 2019-10-28 DIAGNOSIS — K5900 Constipation, unspecified: Secondary | ICD-10-CM | POA: Diagnosis present

## 2019-10-28 DIAGNOSIS — Z841 Family history of disorders of kidney and ureter: Secondary | ICD-10-CM | POA: Diagnosis not present

## 2019-10-28 DIAGNOSIS — K631 Perforation of intestine (nontraumatic): Secondary | ICD-10-CM | POA: Diagnosis not present

## 2019-10-28 DIAGNOSIS — Z20822 Contact with and (suspected) exposure to covid-19: Secondary | ICD-10-CM | POA: Diagnosis not present

## 2019-10-28 DIAGNOSIS — K659 Peritonitis, unspecified: Secondary | ICD-10-CM | POA: Diagnosis present

## 2019-10-28 DIAGNOSIS — F17210 Nicotine dependence, cigarettes, uncomplicated: Secondary | ICD-10-CM | POA: Diagnosis present

## 2019-10-28 DIAGNOSIS — T82898A Other specified complication of vascular prosthetic devices, implants and grafts, initial encounter: Secondary | ICD-10-CM | POA: Diagnosis not present

## 2019-10-28 DIAGNOSIS — K650 Generalized (acute) peritonitis: Secondary | ICD-10-CM | POA: Diagnosis not present

## 2019-10-28 DIAGNOSIS — Z7982 Long term (current) use of aspirin: Secondary | ICD-10-CM

## 2019-10-28 DIAGNOSIS — L02211 Cutaneous abscess of abdominal wall: Secondary | ICD-10-CM | POA: Diagnosis not present

## 2019-10-28 DIAGNOSIS — K6389 Other specified diseases of intestine: Secondary | ICD-10-CM | POA: Diagnosis not present

## 2019-10-28 DIAGNOSIS — A4150 Gram-negative sepsis, unspecified: Secondary | ICD-10-CM | POA: Diagnosis present

## 2019-10-28 DIAGNOSIS — Z880 Allergy status to penicillin: Secondary | ICD-10-CM

## 2019-10-28 DIAGNOSIS — R0602 Shortness of breath: Secondary | ICD-10-CM | POA: Diagnosis not present

## 2019-10-28 DIAGNOSIS — K5730 Diverticulosis of large intestine without perforation or abscess without bleeding: Secondary | ICD-10-CM | POA: Diagnosis not present

## 2019-10-28 DIAGNOSIS — D509 Iron deficiency anemia, unspecified: Secondary | ICD-10-CM | POA: Diagnosis not present

## 2019-10-28 DIAGNOSIS — I4581 Long QT syndrome: Secondary | ICD-10-CM | POA: Diagnosis present

## 2019-10-28 DIAGNOSIS — Z83438 Family history of other disorder of lipoprotein metabolism and other lipidemia: Secondary | ICD-10-CM | POA: Diagnosis not present

## 2019-10-28 DIAGNOSIS — R519 Headache, unspecified: Secondary | ICD-10-CM | POA: Diagnosis present

## 2019-10-28 DIAGNOSIS — L309 Dermatitis, unspecified: Secondary | ICD-10-CM | POA: Diagnosis present

## 2019-10-28 LAB — COMPREHENSIVE METABOLIC PANEL
ALT: 17 U/L (ref 0–44)
AST: 16 U/L (ref 15–41)
Albumin: 1.9 g/dL — ABNORMAL LOW (ref 3.5–5.0)
Alkaline Phosphatase: 162 U/L — ABNORMAL HIGH (ref 38–126)
Anion gap: 21 — ABNORMAL HIGH (ref 5–15)
BUN: 46 mg/dL — ABNORMAL HIGH (ref 6–20)
CO2: 25 mmol/L (ref 22–32)
Calcium: 9.5 mg/dL (ref 8.9–10.3)
Chloride: 86 mmol/L — ABNORMAL LOW (ref 98–111)
Creatinine, Ser: 12.03 mg/dL — ABNORMAL HIGH (ref 0.44–1.00)
GFR calc Af Amer: 4 mL/min — ABNORMAL LOW (ref >60)
GFR calc non Af Amer: 3 mL/min — ABNORMAL LOW (ref >60)
Glucose, Bld: 93 mg/dL (ref 70–99)
Potassium: 2.7 mmol/L — CL (ref 3.5–5.1)
Sodium: 132 mmol/L — ABNORMAL LOW (ref 135–145)
Total Bilirubin: 4.4 mg/dL — ABNORMAL HIGH (ref 0.3–1.2)
Total Protein: 6.4 g/dL — ABNORMAL LOW (ref 6.5–8.1)

## 2019-10-28 LAB — CBC
HCT: 38.2 % (ref 36.0–46.0)
Hemoglobin: 12.9 g/dL (ref 12.0–15.0)
MCH: 32.7 pg (ref 26.0–34.0)
MCHC: 33.8 g/dL (ref 30.0–36.0)
MCV: 96.7 fL (ref 80.0–100.0)
Platelets: 254 10*3/uL (ref 150–400)
RBC: 3.95 MIL/uL (ref 3.87–5.11)
RDW: 13.2 % (ref 11.5–15.5)
WBC: 9.5 10*3/uL (ref 4.0–10.5)
nRBC: 0 % (ref 0.0–0.2)

## 2019-10-28 LAB — BODY FLUID CELL COUNT WITH DIFFERENTIAL
Eos, Fluid: 1 %
Lymphs, Fluid: 1 %
Monocyte-Macrophage-Serous Fluid: 1 % — ABNORMAL LOW (ref 50–90)
Neutrophil Count, Fluid: 97 % — ABNORMAL HIGH (ref 0–25)
Total Nucleated Cell Count, Fluid: 3175 cu mm — ABNORMAL HIGH (ref 0–1000)

## 2019-10-28 LAB — SARS CORONAVIRUS 2 (TAT 6-24 HRS): SARS Coronavirus 2: NEGATIVE

## 2019-10-28 LAB — LIPASE, BLOOD: Lipase: 18 U/L (ref 11–51)

## 2019-10-28 LAB — LACTIC ACID, PLASMA: Lactic Acid, Venous: 1.1 mmol/L (ref 0.5–1.9)

## 2019-10-28 IMAGING — CT CT ABD-PELV W/O CM
2 of 4 series · 15 of 46 positions shown, 17 images · non-contrast
Comparison: CT Abdomen and Pelvis [41] hours today. Abdominal
radiographs [DATE].

CLINICAL DATA: 54-year-old female with end-stage renal disease and
peritoneal dialysis catheter. Recently hospitalized for
acinetobacter peritonitis status post 2 week course of IV
antibiotics. Recurrent pain and cloudy peritoneal fluid. Placed back
on IV ceftazidime without improvement.

Peritoneal cultures positive for gram-negative rods today.
Free air on earlier CT without oral contrast. Query extravasation of
oral contrast.
EXAM:
CT ABDOMEN AND PELVIS WITHOUT CONTRAST
TECHNIQUE: Multidetector CT imaging of the abdomen and pelvis was performed
following the standard protocol without IV contrast.

[Series 3: a/p w/o 5mm · axial · non-contrast · 0.98mm/px · z∈[+841,+1286]mm · 12 of 103 slices shown, 14 images]
[im 9/103  soft-tissue]
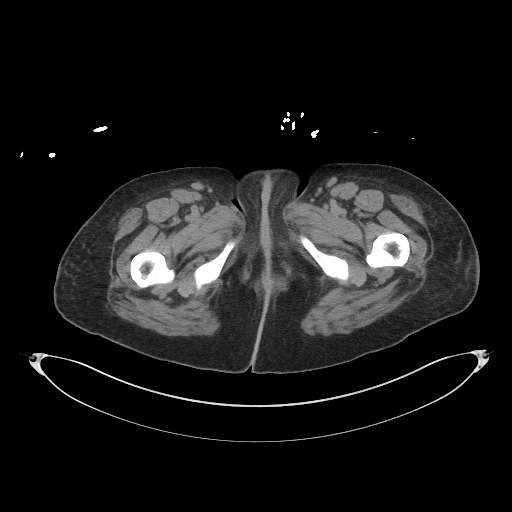
[im 9/103  bone]
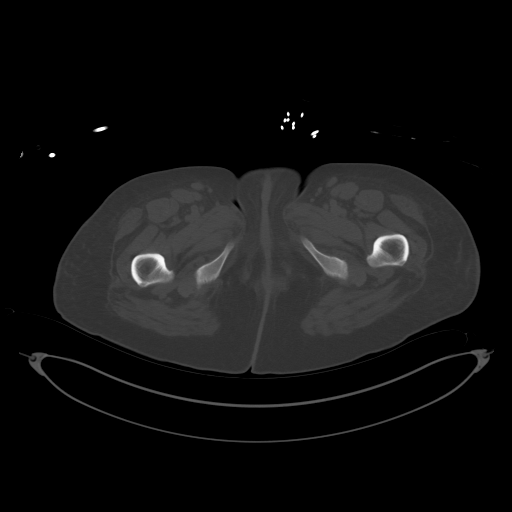
[im 17/103  soft-tissue]
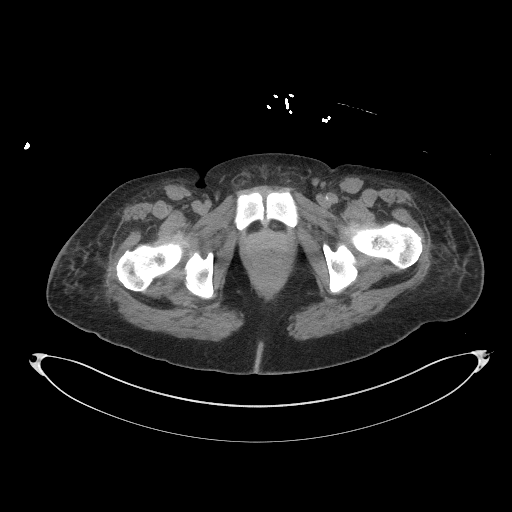
[im 25/103  soft-tissue]
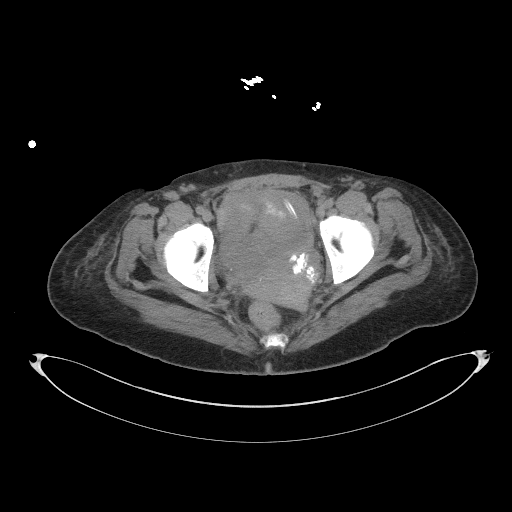
[im 33/103  soft-tissue]
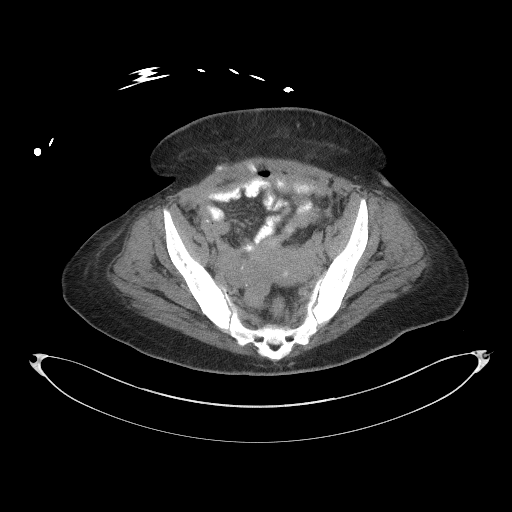
[im 41/103  soft-tissue]
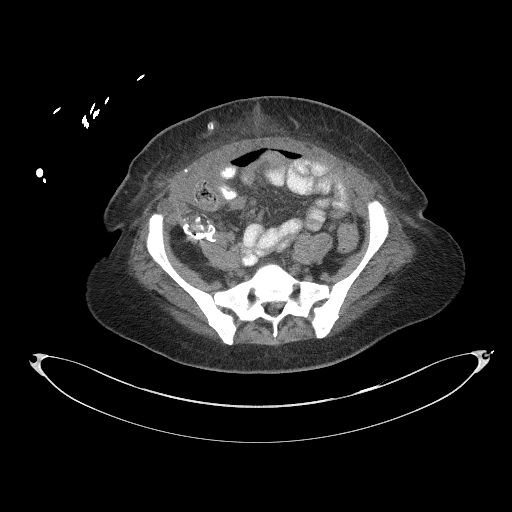
[im 49/103  soft-tissue]
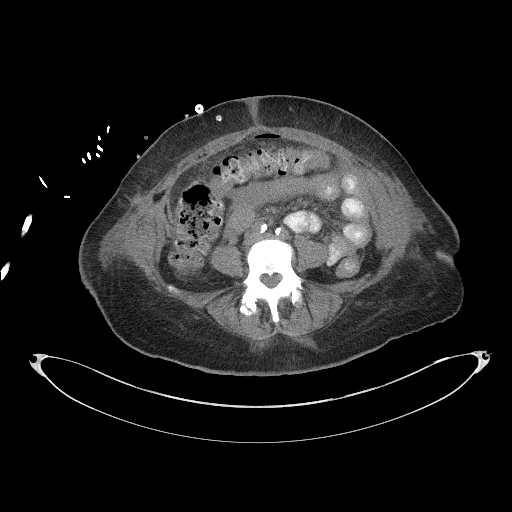
[im 58/103  soft-tissue]
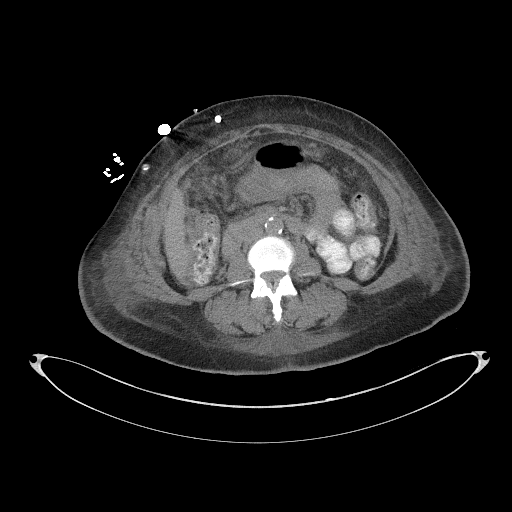
[im 66/103  soft-tissue]
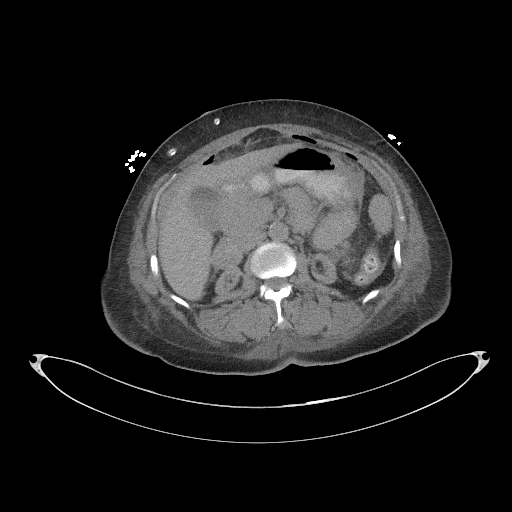
[im 74/103  soft-tissue]
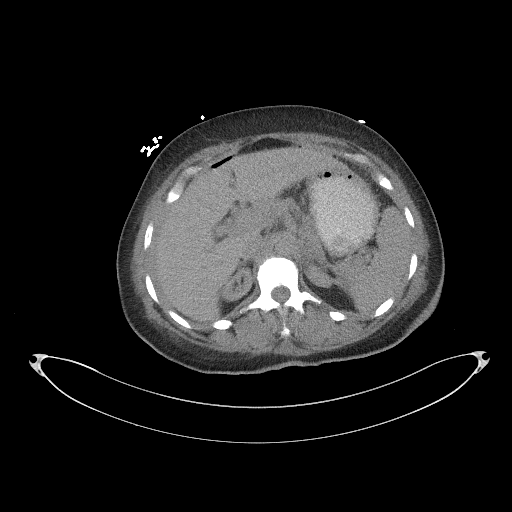
[im 74/103  bone]
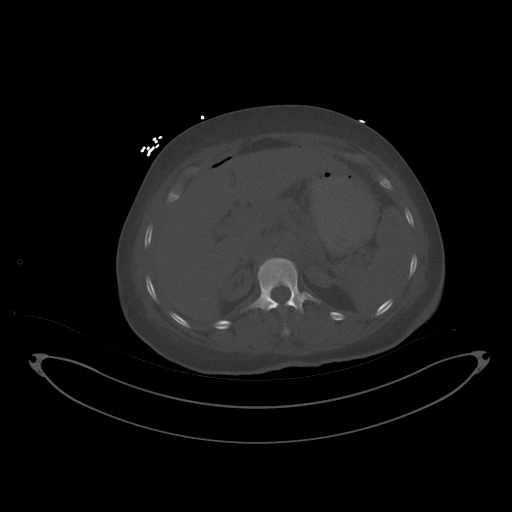
[im 82/103  soft-tissue]
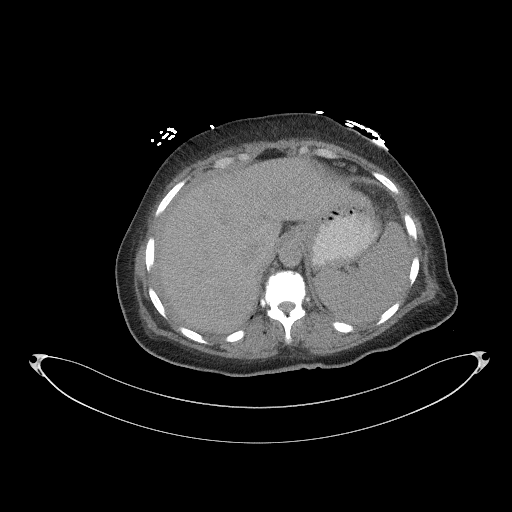
[im 90/103  soft-tissue]
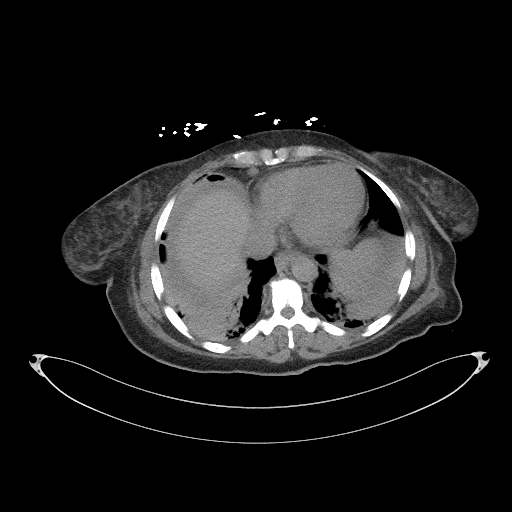
[im 98/103  soft-tissue]
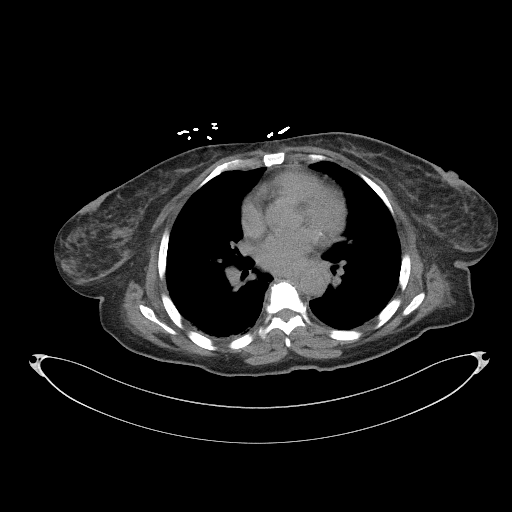

[Series 6: a/p w/o cor · coronal · non-contrast · 0.94mm/px · 3 of 164 slices shown]
[im 55/164  soft-tissue]
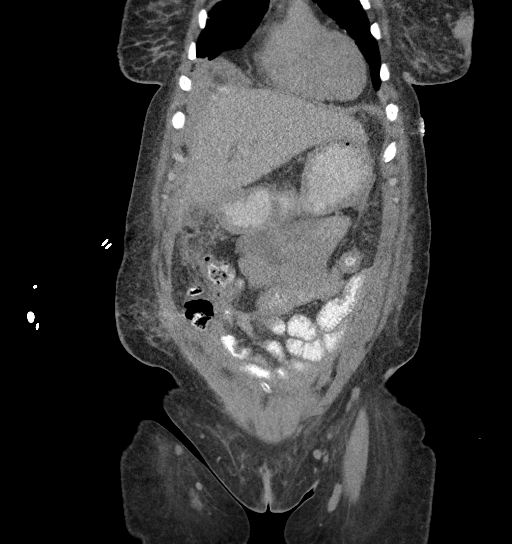
[im 73/164  soft-tissue]
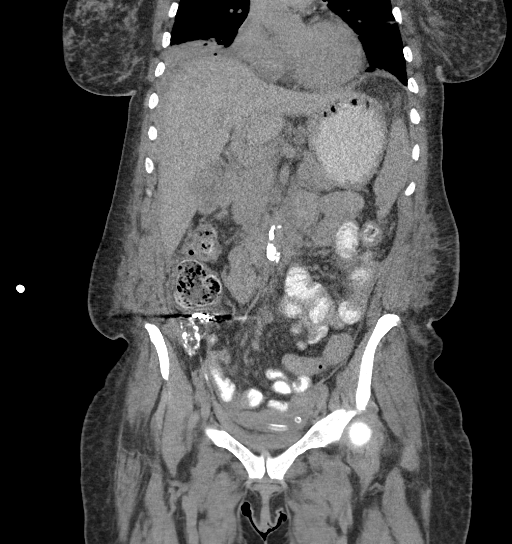
[im 91/164  soft-tissue]
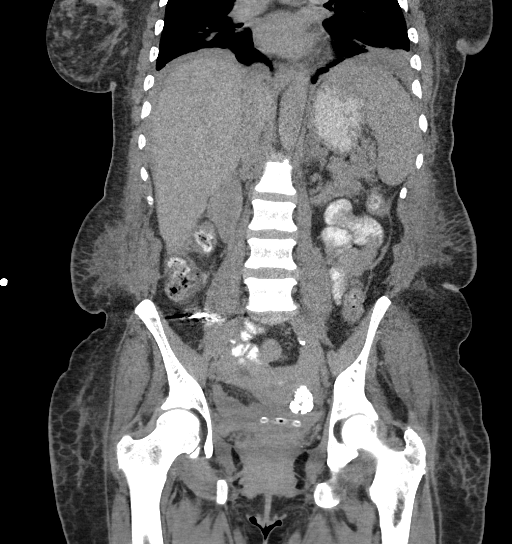

[15 of 46 positions shown; findings below may reference images not displayed]

FINDINGS: Lower chest: Stable lung bases with confluent multifocal lower lobe
and right middle lobe opacity which could be atelectasis or
pneumonia. No pericardial or pleural effusion.

Hepatobiliary: Some vicarious excretion of contrast to the
gallbladder. Stable liver and small volume perihepatic fluid.

Pancreas: Stable.

Spleen: Stable.

Adrenals/Urinary Tract: Stable adrenal thickening and native renal
atrophy. Decompressed urinary bladder.

Stomach/Bowel: Oral contrast was administered and has reached the
distal small bowel but not yet the terminal ileum. There was some
residual contrast mixed with stool in the cecum previously which
appears unchanged.

Trace abdominal free fluid with no definite extravasation of oral
contrast. Small bowel loops remain thick-walled in the proximal
jejunum. No interval dilatation of small bowel. Unchanged large
bowel with mild generalized wall thickening also.

The volume of free intraperitoneal air is mildly increased in the
lower abdomen on series 3, image 65, but stable elsewhere (actually
slightly decreased over the liver dome).

There are chronic embolization coils in the right lower quadrant
(series 3, image 61). This might be related to a previous pelvic
renal transplant, uncertain.

Negative stomach.

Vascular/Lymphatic: Aortoiliac calcified atherosclerosis.

Reproductive: Stable from earlier, Multiple calcified uterine
fibroids.

Other: Unchanged peritoneal dialysis catheter position in the lower
pelvis overlying the decompressed urinary bladder (series 3, image
81).

Musculoskeletal: Renal osteodystrophy suspected. Multilevel advanced
disc disease with vacuum disc. Grade 1 anterolisthesis in the lower
lumbar spine with severe facet arthropathy. No acute osseous
abnormality identified.
IMPRESSION: 1. No extravasation of the oral contrast which has reached the
distal small bowel but not yet the TI at the time of these images.
Meanwhile, the overall volume of intraperitoneal air has not
significantly changed from [41] hours today.

2. Otherwise stable CT appearance of the abdomen and pelvis,
including proximal small bowel and generalized large bowel wall
thickening.

3. Stable lung bases with confluent right middle and bilateral lower
lobe atelectasis versus pneumonia.

## 2019-10-28 IMAGING — CT CT ABD-PELV W/ CM
2 of 5 series · 16 of 46 positions shown, 18 images · IV contrast (Omni 300)
Comparison: None.

CLINICAL DATA: Abdominal abscesses. Assess for peritonitis or
perforation.

EXAM:
CT ABDOMEN AND PELVIS WITH CONTRAST
TECHNIQUE: Multidetector CT imaging of the abdomen and pelvis was performed
using the standard protocol following bolus administration of
intravenous contrast.
CONTRAST:  100mL OMNIPAQUE IOHEXOL 300 MG/ML  SOLN

[Series 3: a/p w/ 5mm · axial · 0.73mm/px · z∈[-475,-75]mm · 13 of 90 slices shown, 15 images]
[im 5/90  soft-tissue]
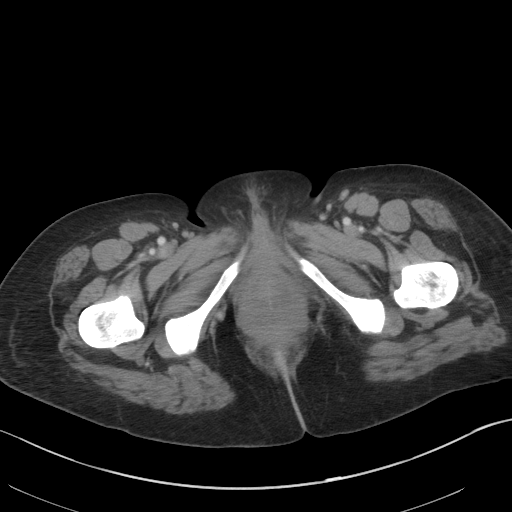
[im 5/90  bone]
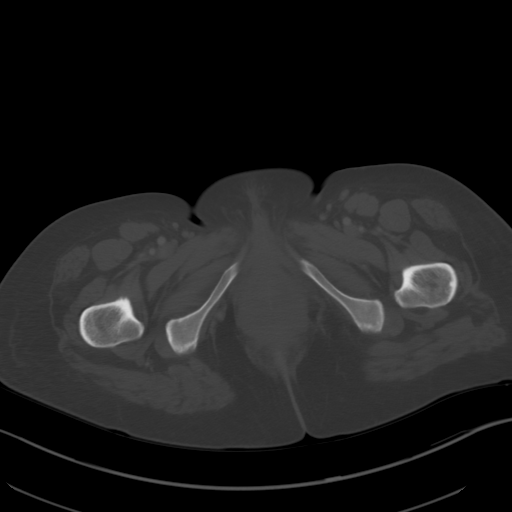
[im 13/90  soft-tissue]
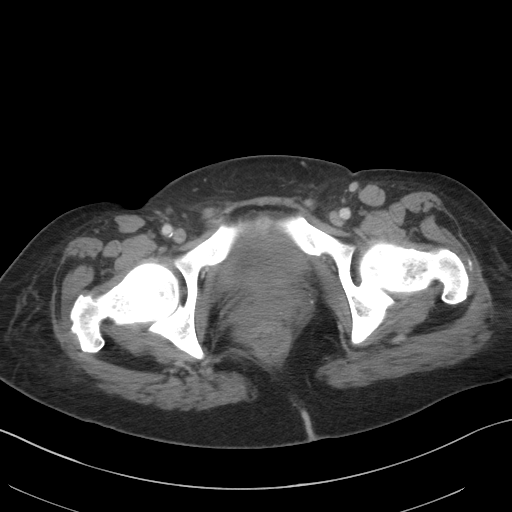
[im 17/90  soft-tissue]
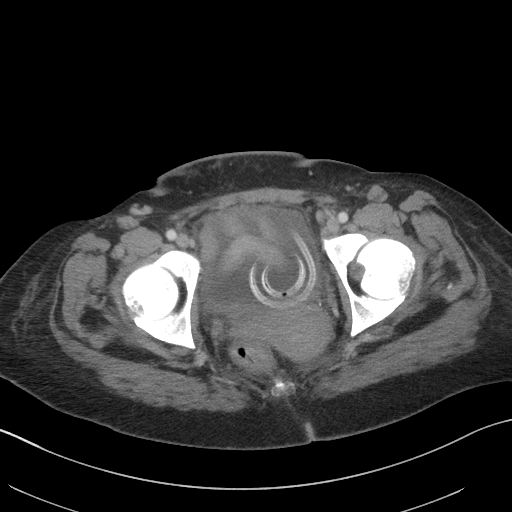
[im 26/90  soft-tissue]
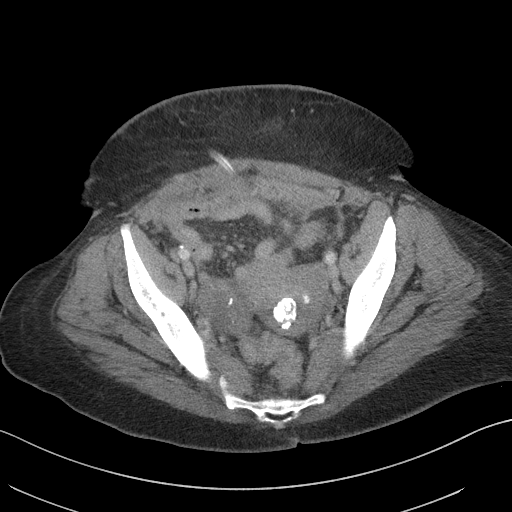
[im 30/90  soft-tissue]
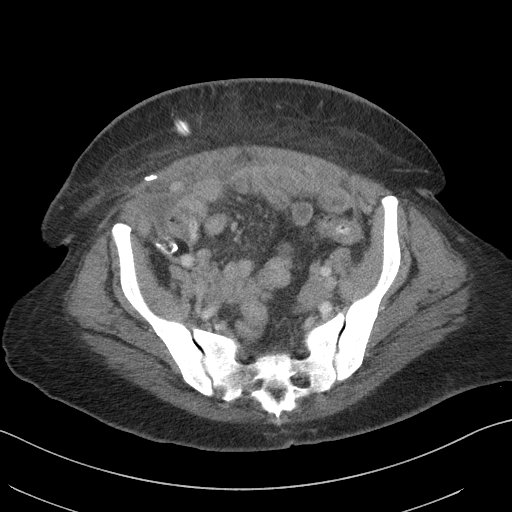
[im 39/90  soft-tissue]
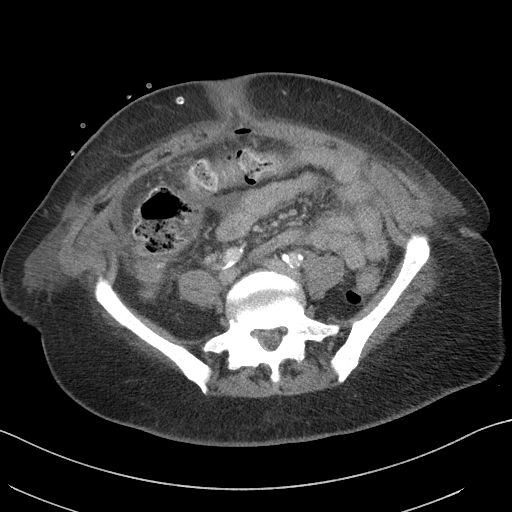
[im 47/90  soft-tissue]
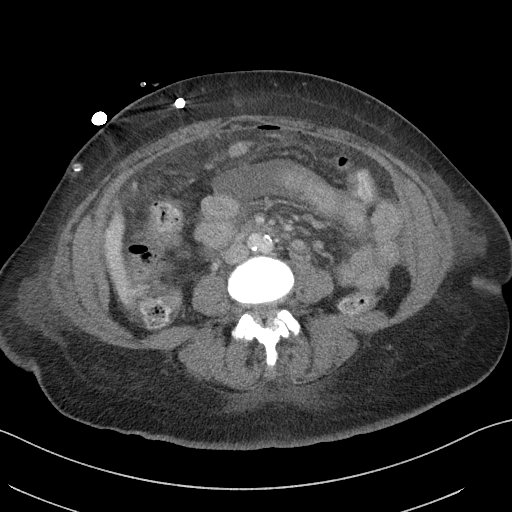
[im 51/90  soft-tissue]
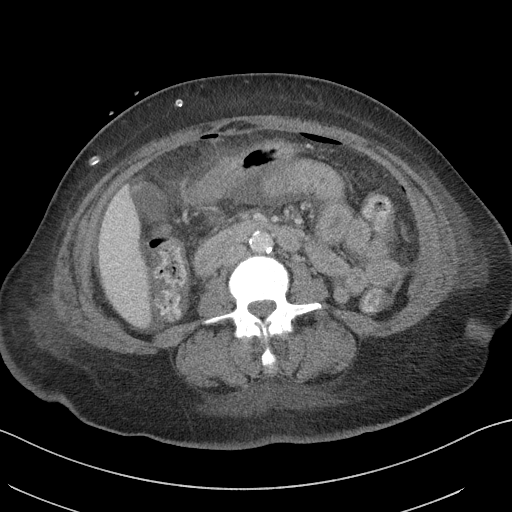
[im 60/90  soft-tissue]
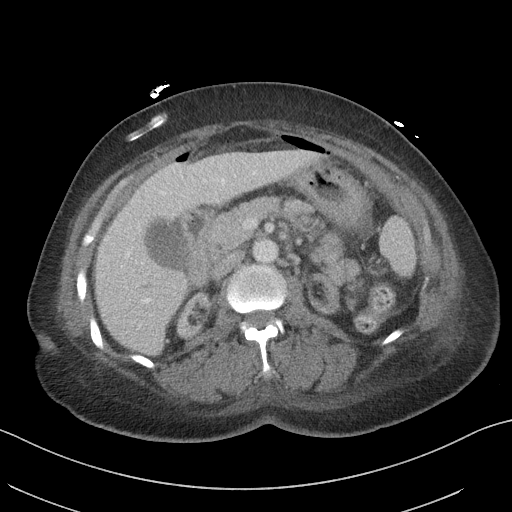
[im 60/90  bone]
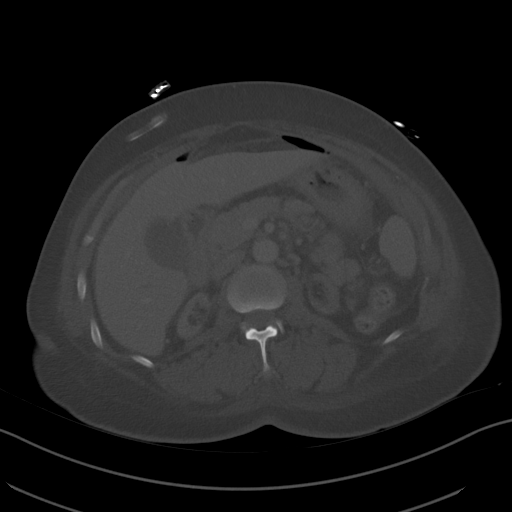
[im 64/90  soft-tissue]
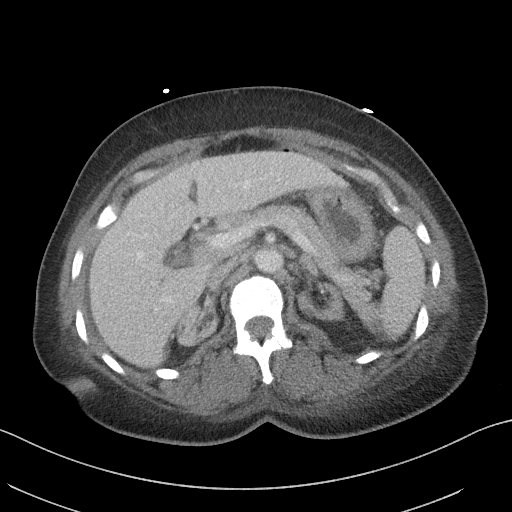
[im 73/90  soft-tissue]
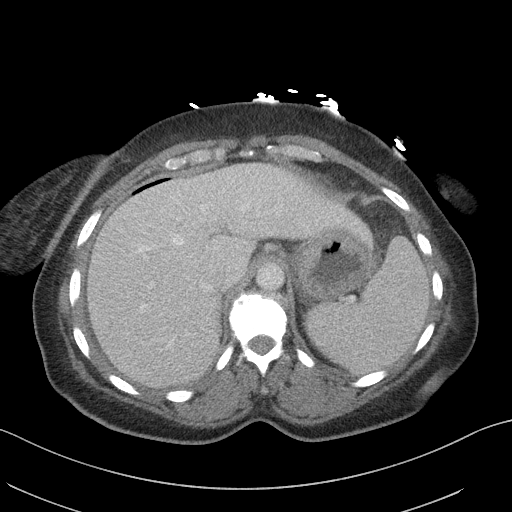
[im 77/90  soft-tissue]
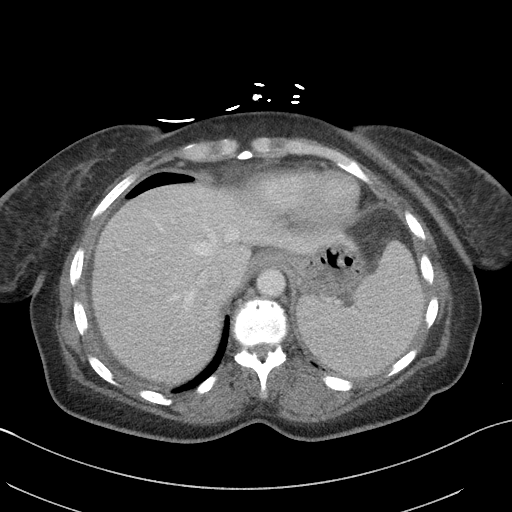
[im 85/90  soft-tissue]
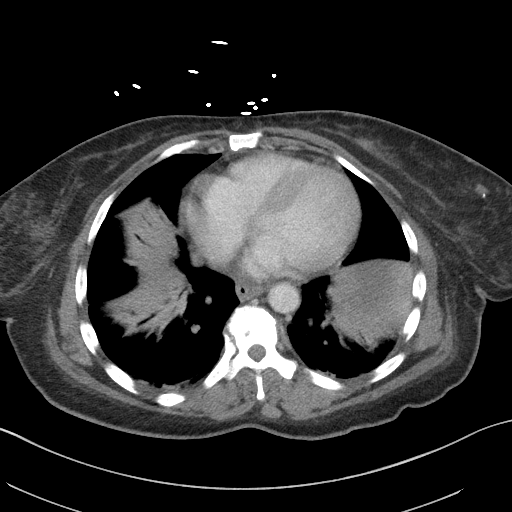

[Series 7: a/p w/ cor · coronal · 0.81mm/px · 3 of 151 slices shown]
[im 51/151  soft-tissue]
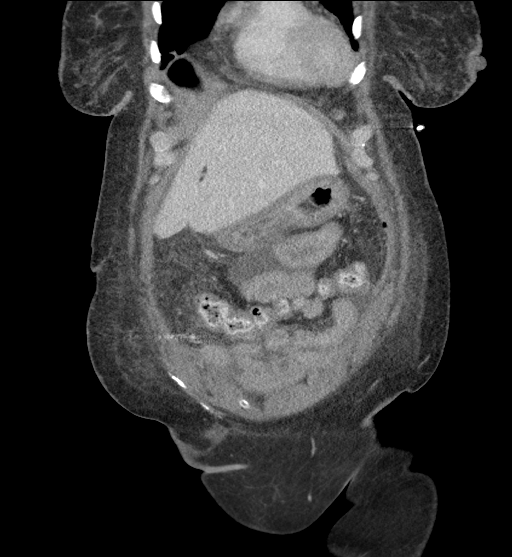
[im 67/151  soft-tissue]
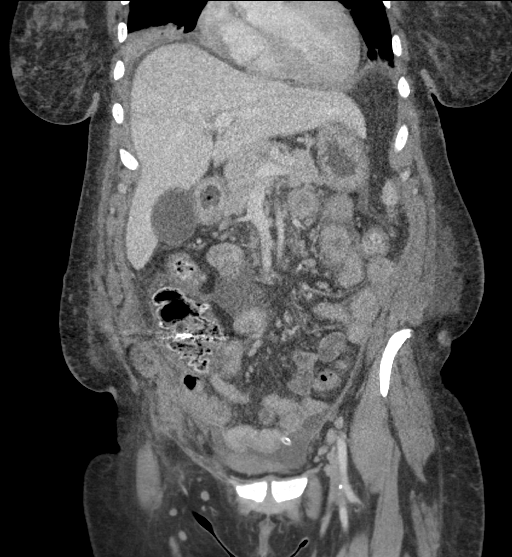
[im 84/151  soft-tissue]
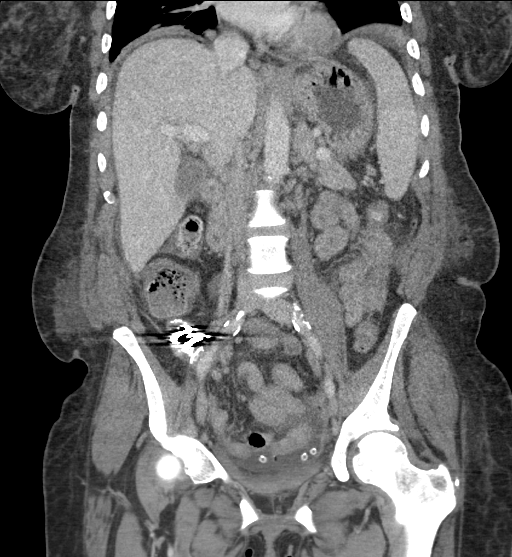

[16 of 46 positions shown; findings below may reference images not displayed]

FINDINGS: Lower chest: Consolidation of the anterior left lower lobe is
identified. Atelectasis of bilateral lung bases are noted. The heart
size is normal.

Hepatobiliary: No focal liver abnormality is seen. No gallstones,
gallbladder wall thickening, or biliary dilatation.

Pancreas: Unremarkable. No pancreatic ductal dilatation or
surrounding inflammatory changes.

Spleen: Normal in size without focal abnormality.

Adrenals/Urinary Tract: Bilateral adrenal glands are normal. Atrophy
of bilateral kidneys are noted. The catheter is identified in the
bladder.

Stomach/Bowel: There is no bowel obstruction. Thick wall small bowel
loops are identified in the upper abdomen. There is free air.
Postsurgical changes are identified in the right lower quadrant.
There is diverticulosis of colon.

Vascular/Lymphatic: Aortic atherosclerosis. No enlarged abdominal or
pelvic lymph nodes.

Reproductive: Calcified uterine fibroids are noted.

Other: Small amount of ascites is noted in the abdomen and pelvis.

Musculoskeletal: Degenerative joint changes of the spine are noted.
IMPRESSION: 1. Thick wall small bowel loops are identified in the upper abdomen.
There is free air in the abdomen. The findings are suspicious for
bowel perforation.
2. Small amount of ascites in the abdomen and pelvis.
3. Consolidation of the anterior left lower lobe, pneumonia is not
excluded.

Aortic Atherosclerosis ([ZJ]-[ZJ]).

These results will be called to the ordering clinician or
representative by the Radiologist Assistant, and communication
documented in the PACS or [REDACTED].

## 2019-10-28 IMAGING — DX DG CHEST 1V PORT
1 series · 1 of 1 positions shown · non-contrast
Comparison: [DATE]

CLINICAL DATA: Shortness of breath.

EXAM:
PORTABLE CHEST 1 VIEW

[chest]
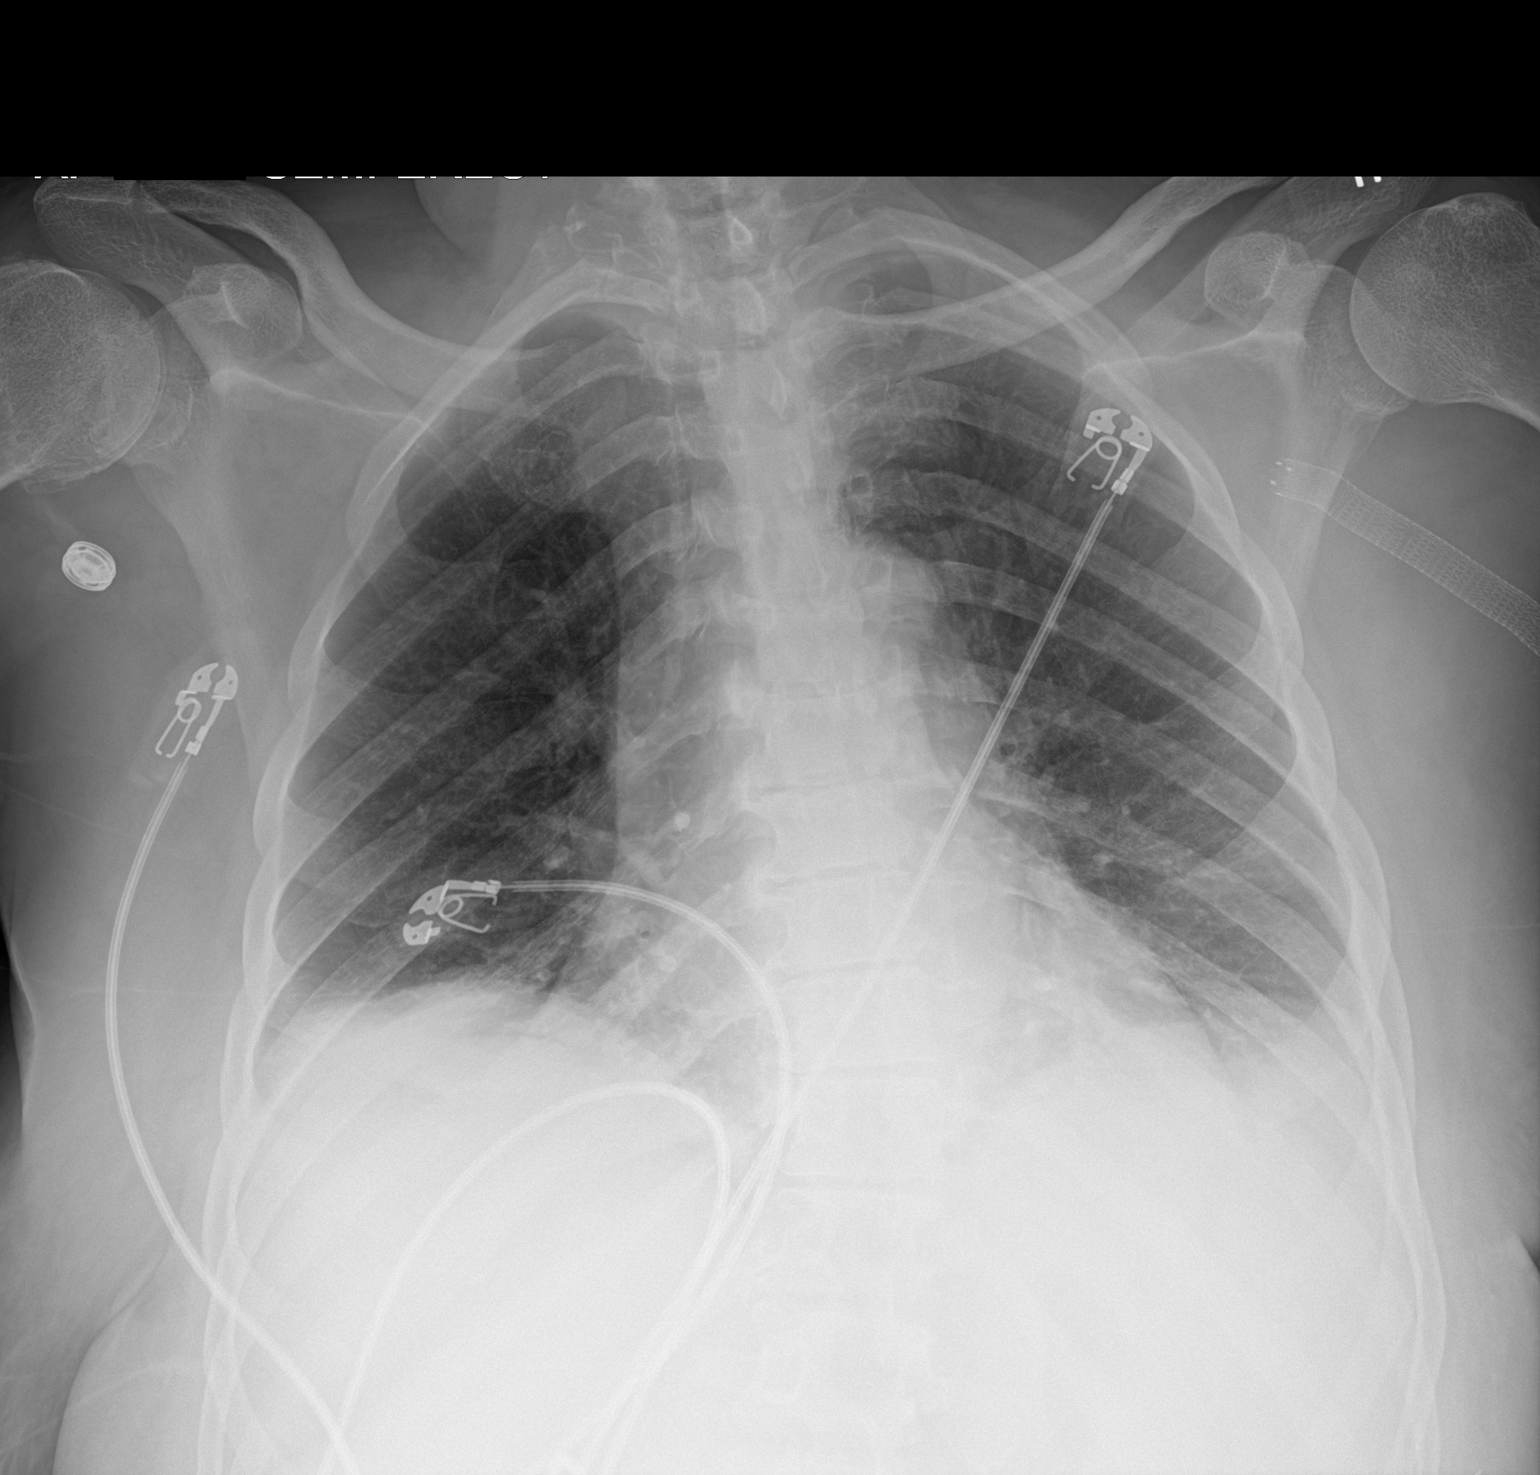

[1 of 1 positions shown; findings below may reference images not displayed]

FINDINGS: Vague densities at both lung bases and new from the previous
examination. The upper lungs are clear. Negative for a pneumothorax.
Again noted is a vascular stent in the left axilla. Heart size is
within normal limits. Patient is slightly rotated on the
examination.
IMPRESSION: Subtle basilar chest densities bilaterally. Findings could represent
atelectasis and/or small pleural effusions.

No evidence for pulmonary edema.

## 2019-10-28 MED ORDER — CINACALCET HCL 30 MG PO TABS
90.0000 mg | ORAL_TABLET | Freq: Every day | ORAL | Status: DC
  Administered 2019-10-29 – 2019-11-01 (×4): 90 mg via ORAL
  Filled 2019-10-28 (×4): qty 3

## 2019-10-28 MED ORDER — SODIUM CHLORIDE 0.9 % IV SOLN
1.0000 g | INTRAVENOUS | Status: DC
  Administered 2019-10-28: 1 g via INTRAVENOUS
  Filled 2019-10-28 (×2): qty 1

## 2019-10-28 MED ORDER — CALCITRIOL 0.25 MCG PO CAPS
0.2500 ug | ORAL_CAPSULE | Freq: Every day | ORAL | Status: DC
  Administered 2019-10-28 – 2019-11-01 (×4): 0.25 ug via ORAL
  Filled 2019-10-28 (×5): qty 1

## 2019-10-28 MED ORDER — ASPIRIN EC 81 MG PO TBEC
81.0000 mg | DELAYED_RELEASE_TABLET | Freq: Every day | ORAL | Status: DC
  Administered 2019-10-29 – 2019-10-30 (×2): 81 mg via ORAL
  Filled 2019-10-28 (×3): qty 1

## 2019-10-28 MED ORDER — VANCOMYCIN 100 MG/ML FOR DIALYSIS
2200.0000 mg | INJECTION | Freq: Once | Status: DC
  Administered 2019-10-28: 2200 mg via INTRAPERITONEAL
  Filled 2019-10-28: qty 0

## 2019-10-28 MED ORDER — HYDROMORPHONE HCL 1 MG/ML IJ SOLN
1.0000 mg | INTRAMUSCULAR | Status: AC | PRN
  Administered 2019-10-28 (×3): 1 mg via INTRAVENOUS
  Filled 2019-10-28 (×4): qty 1

## 2019-10-28 MED ORDER — CHLORHEXIDINE GLUCONATE CLOTH 2 % EX PADS
6.0000 | MEDICATED_PAD | Freq: Every day | CUTANEOUS | Status: DC
  Administered 2019-10-31 – 2019-11-01 (×2): 6 via TOPICAL

## 2019-10-28 MED ORDER — HEPARIN SODIUM (PORCINE) 5000 UNIT/ML IJ SOLN
5000.0000 [IU] | Freq: Three times a day (TID) | INTRAMUSCULAR | Status: DC
  Administered 2019-10-28 – 2019-10-30 (×4): 5000 [IU] via SUBCUTANEOUS
  Filled 2019-10-28 (×6): qty 1

## 2019-10-28 MED ORDER — SODIUM CHLORIDE 0.9% FLUSH: 3.0000 mL | Freq: Once | INTRAVENOUS | Status: DC

## 2019-10-28 MED ORDER — POTASSIUM CHLORIDE 10 MEQ/100ML IV SOLN
10.0000 meq | INTRAVENOUS | Status: AC
  Administered 2019-10-28 (×4): 10 meq via INTRAVENOUS
  Filled 2019-10-28 (×4): qty 100

## 2019-10-28 MED ORDER — IOHEXOL 300 MG/ML  SOLN
100.0000 mL | Freq: Once | INTRAMUSCULAR | Status: AC | PRN
  Administered 2019-10-28: 100 mL via INTRAVENOUS

## 2019-10-28 MED ORDER — PRO-STAT SUGAR FREE PO LIQD
30.0000 mL | Freq: Two times a day (BID) | ORAL | Status: DC
  Administered 2019-10-31 – 2019-11-01 (×3): 30 mL via ORAL
  Filled 2019-10-28 (×6): qty 30

## 2019-10-28 MED ORDER — ONDANSETRON HCL 4 MG/2ML IJ SOLN: 4.0000 mg | Freq: Once | INTRAMUSCULAR | Status: DC

## 2019-10-28 MED ORDER — ONDANSETRON HCL 4 MG/2ML IJ SOLN
4.0000 mg | Freq: Once | INTRAMUSCULAR | Status: AC
  Administered 2019-10-28: 4 mg via INTRAVENOUS
  Filled 2019-10-28: qty 2

## 2019-10-28 MED ORDER — CEFTAZIDIME 200 MG/ML FOR DIALYSIS
1500.0000 mg | INJECTION | Freq: Once | INTRAVENOUS_CENTRAL | Status: DC
  Administered 2019-10-28: 1500 mg via INTRAPERITONEAL
  Filled 2019-10-28: qty 7.5

## 2019-10-28 NOTE — Progress Notes (Signed)
Placed 1 liter of PD fluid in abdomen so that a culture may be drawn later.

## 2019-10-28 NOTE — ED Notes (Signed)
Pt with PO contrast at bedside. Approximately 2/3 completed. Encouraged pt to finish the rest of the contrast within the next 25 minutes. Denies nausea at this time.

## 2019-10-28 NOTE — H&P (Addendum)
Date: 10/28/2019               Ruth Gutierrez Name:  Ruth Gutierrez MRN: [id number]  DOB: [birth year] Age / Sex: 54 y.o., female   PCP: Rexene Agent, MD         Medical Service: Internal Medicine Teaching Service         Attending Physician: Dr. Langston Masker Carola Rhine, MD    First Contact: Ronnald Ramp, MD, Dorian Pod Pager: EJ [phone])  Second Contact: Hedwig Morton, Jaimie PagerJari Pigg [phone])       After Hours (After 5p/  First Contact Pager: [phone]  weekends / holidays): Second Contact Pager: [phone]   Chief Complaint: Abdominal pain  History of Present Illness: Ruth Gutierrez is a 54 year old female with a pertinent past medical history of ESRD on PD, hypertension, iron deficiency anemia, to [facility] with Fuhs abdominal pain with cloudy peritoneal dialysate fluid.  States that Ruth Gutierrez has had a difficult course with bacterial peritonitis secondary to Acinetobacter.  Ruth Gutierrez was recently admitted for this was treated with 2 weeks course of ceftazidime.  Ruth Gutierrez admitted to improvement of her symptoms but quickly developed recurrence within a couple days after completing her 2-week course.  Ruth Gutierrez's peritoneal fluid was cultured in outpatient setting showing gram-negative rods pending speciation and sensitivities.  Ruth Gutierrez was supposed to be started on ciprofloxacin today, but continued to feel worse which prompted her visit to the ED today.  ED Course: Nephrology was consulted in the ED the Ruth Gutierrez was started on meropenem per ID recommendations.     Lab Orders     Culture, blood (routine x 2)     Body fluid culture     SARS CORONAVIRUS 2 (TAT 6-24 HRS) Nasopharyngeal Nasopharyngeal Swab     Lipase, blood     Comprehensive metabolic panel     CBC     Body fluid cell count with differential   Meds:  Current Meds  Medication Sig   aspirin (BAYER ASPIRIN EC LOW DOSE) 81 MG EC tablet Take 81 mg by mouth as directed.   calcitRIOL (ROCALTROL) 0.25 MCG capsule Take 0.25 mcg by mouth daily.   cinacalcet (SENSIPAR) 90 MG tablet Take 90 mg by mouth daily.    omeprazole (PRILOSEC OTC) 20 MG tablet Take 20 mg by mouth daily as needed for heartburn.   polyethylene glycol (MIRALAX / GLYCOLAX) 17 g packet Take 17 g by mouth daily. (Ruth Gutierrez taking differently: Take 17 g by mouth daily as needed for mild constipation. )   potassium chloride SA (KLOR-CON) 20 MEQ tablet Take 2 tablets (40 mEq total) by mouth daily.   sucroferric oxyhydroxide (VELPHORO) 500 MG chewable tablet Chew 1,500 mg by mouth 3 (three) times daily with meals. Taking one tablet with a snack sometimes    Social:  Social History   Socioeconomic History   Marital status: Single    Spouse name: Not on file   Number of children: 1   Years of education: Not on file   Highest education level: Not on file  Occupational History   Occupation: unemployed  Tobacco Use   Smoking status: Current Every Day Smoker    Packs/day: 0.50    Years: 27.00    Pack years: 13.50    Types: Cigarettes   Smokeless tobacco: Never Used   Tobacco comment: 10 cigarettes a day  Substance and Sexual Activity   Alcohol use: No    Alcohol/week: 0.0 standard drinks    Comment: occasional  drinker noted in the past   Drug use: No   Sexual activity: Yes    Partners: Male    Birth control/protection: None  Other Topics Concern   Not on file  Social History Narrative   Single, 1 daughter   Lives with daughter (born 69) and grandkids   sister helps her with medications etc.   cigarette smoker, rare EtOH, no drugs   Social Determinants of Radio broadcast assistant Strain:    Difficulty of Paying Living Expenses:   Food Insecurity:    Worried About Charity fundraiser in the Last Year:    Arboriculturist in the Last Year:   Transportation Needs:    Film/video editor (Medical):    Lack of Transportation (Non-Medical):   Physical Activity:    Days of Exercise per Week:    Minutes of Exercise per Session:   Stress:    Feeling of  Stress :   Social Connections:    Frequency of Communication with Friends and Family:    Frequency of Social Gatherings with Friends and Family:    Attends Religious Services:    Active Member of Clubs or Organizations:    Attends Music therapist:    Marital Status:   Intimate Partner Violence:    Fear of Current or Ex-Partner:    Emotionally Abused:    Physically Abused:    Sexually Abused:    Family History:  Family History  Problem Relation Age of Onset   Hypertension Mother    Hypertension Father    Diabetes Brother    Deep vein thrombosis Brother    Hypertension Sister    Hyperlipidemia Sister    Kidney disease Brother        on HD   Hypertension Sister    Colon cancer Neg Hx    Esophageal cancer Neg Hx    Rectal cancer Neg Hx     Allergies: Allergies as of 10/28/2019 - Review Complete 10/28/2019  Allergen Reaction Noted   Penicillin g Itching and Rash 11/12/2015   Penicillins Itching and Rash 02/02/2013   Past Medical History:  Diagnosis Date   Anemia of chronic disease    Arthritis    Deceased-donor kidney transplant    Performed at [facility], April 2010.  Initial ESRD due to HTN nephropathy   Eczema    ESRD (end stage renal disease) (HCC)    s/p transplant creatinine baseline 1.1  M/W/F dialysis   FUO (fever of unknown origin) 05/17/2015   GERD (gastroesophageal reflux disease)    Headache(784.0)    History of hyperparathyroidism    Hypertension    Peritonitis ([place]) 10/2019   Shortness of breath    Wears glasses     Review of Systems: A complete ROS was negative except as per HPI.   Physical Exam: Blood pressure (!) 141/91, pulse (!) 105, temperature 97.8 F (36.6 C), temperature source Oral, resp. rate (!) 22, height 5\' 7"  (1.702 m), weight 73.5 kg, last menstrual period 06/02/2015, SpO2 99 %. Physical Exam  Constitutional: Ruth Gutierrez is oriented to person, place, and time. Ruth Gutierrez appears distressed.  HENT:  Head: Normocephalic and  atraumatic.  Eyes: EOM are normal.  Cardiovascular: Normal rate and intact distal pulses.  No murmur heard. Pulmonary/Chest: Effort normal and breath sounds normal. No respiratory distress.  Abdominal: Soft. Ruth Gutierrez exhibits no distension. There is abdominal tenderness (diffuse). There is no rigidity, no rebound, no guarding and negative Murphy's sign.  No  rebound tenderness  Musculoskeletal:        General: Edema (1+) present. Normal range of motion.     Cervical back: Normal range of motion.  Neurological: Ruth Gutierrez is alert and oriented to person, place, and time.  Skin: Skin is dry. Ruth Gutierrez is not diaphoretic.    Labs: CBC    Component Value Date/Time   WBC 9.5 10/28/2019 1114   RBC 3.95 10/28/2019 1114   HGB 12.9 10/28/2019 1114   HCT 38.2 10/28/2019 1114   PLT 254 10/28/2019 1114   MCV 96.7 10/28/2019 1114   MCH 32.7 10/28/2019 1114   MCHC 33.8 10/28/2019 1114   RDW 13.2 10/28/2019 1114   LYMPHSABS 1.0 10/04/2019 0500   MONOABS 0.7 10/04/2019 0500   EOSABS 0.2 10/04/2019 0500   BASOSABS 0.0 10/04/2019 0500     CMP     Component Value Date/Time   NA 132 (L) 10/28/2019 1114   K 2.7 (LL) 10/28/2019 1114   CL 86 (L) 10/28/2019 1114   CO2 25 10/28/2019 1114   GLUCOSE 93 10/28/2019 1114   BUN 46 (H) 10/28/2019 1114   CREATININE 12.03 (H) 10/28/2019 1114   CREATININE 1.47 (H) 04/14/2013 1149   CALCIUM 9.5 10/28/2019 1114   CALCIUM 7.2 (L) 01/13/2007 0830   PROT 6.4 (L) 10/28/2019 1114   ALBUMIN 1.9 (L) 10/28/2019 1114   AST 16 10/28/2019 1114   ALT 17 10/28/2019 1114   ALKPHOS 162 (H) 10/28/2019 1114   BILITOT 4.4 (H) 10/28/2019 1114   GFRNONAA 3 (L) 10/28/2019 1114   GFRNONAA 43 (L) 04/14/2013 1149   GFRAA 4 (L) 10/28/2019 1114   GFRAA 49 (L) 04/14/2013 1149    Imaging: DG Chest Port 1 View  Result Date: 10/28/2019 CLINICAL DATA:  Shortness of breath. EXAM: PORTABLE CHEST 1 VIEW COMPARISON:  03/02/2019 FINDINGS: Vague densities at both lung bases and new from the  previous examination. The upper lungs are clear. Negative for a pneumothorax. Again noted is a vascular stent in the left axilla. Heart size is within normal limits. Ruth Gutierrez is slightly rotated on the examination. IMPRESSION: Subtle basilar chest densities bilaterally. Findings could represent atelectasis and/or small pleural effusions. No evidence for pulmonary edema. Electronically Signed   By: Markus Daft M.D.   On: 10/28/2019 12:09   CT scan: pending    EKG: personally reviewed my interpretation is Sinus tachycardia. Abnormal R-wave progression, early transition. Borderline prolonged QT interval  Assessment & Plan by Problem: Principal Problem:   Peritonitis ([place]) Active Problems:   ESRD on peritoneal dialysis ([place])   Hypokalemia   Ruth Gutierrez is a 54 y.o. with pertinent PMH of ESRD on PD, hypertension, iron deficiency anemia, who presented with recurrent abdominal pain and clud PD fluid and admit for recurrent bacterial peritonitis on hospital day 0  #Recurrent Bacterial Peritonitis, 2/2 to GNR #Sepsis, GNR Ruth Gutierrez recently admitted for bacterial peritonitis secondary to Acinetobacter infection. Ruth Gutierrez was treated with 2 weeks of ceftazidime with recurrence of her symptoms.  Peritoneal cultures were taking at her outpatient kidney clinic and Ruth Gutierrez was started on vancomycin with progression of her symptoms.  Ruth Gutierrez presents today for further evaluation and management.  Ruth Gutierrez presents to [facility], ED without a fever or leukocytosis, but is tachycardic and tachypneic. Nephrology was consulted in the ED Ruth Gutierrez was started on meropenem.  Nephrology recommended removal of the peritoneal catheter and switching to hemodialysis during this hospitalization.  Surgery was consulted for this procedure. - Continue Meropenem  - Surgery plan  to perform PD cath removal.  - Culture shows GNR, waiting of speciation and sensitivities. - Blood cultures pending   #ESRD Ruth Gutierrez's presents with signs and  symptoms concerning for uremia.  Ruth Gutierrez is get diffuse pruritus with appetite/taste changes.  Ruth Gutierrez appears drowsy, which could be secondary to her recent IV Dilaudid. Ruth Gutierrez does appear mildly fluid overloaded with 1-2+ LE edema. Potassium of 2.7. PD catheter is planned to be removed via surgery.  Nephrology intends to take Ruth Gutierrez to hemodialysis tomorrow.  Ruth Gutierrez does have a AV fistula in her left upper arm that has a good thrill. There is no surrounding erythema, edema or warmth. -Hemodialysis tomorrow. -I appreciate nephrology comanaging this Ruth Gutierrez. -Potassium repleted    Diet: Renal VTE: Heparin IVF: None,None Code: Full  Prior to Admission Living Arrangement:  At home in [facility] Anticipated Discharge Location: Home Barriers to Discharge: Further evaluation and management  Dispo: Admit Ruth Gutierrez to Inpatient with expected length of stay greater than 2 midnights.  Signed: Marianna Payment, MD 10/28/2019, 2:28 PM  Pager: [phone]

## 2019-10-28 NOTE — ED Provider Notes (Addendum)
Ruth Gutierrez   CSN: [id number] Arrival date & time: 10/28/19  1101     History Chief Complaint  Patient presents with  . Abdominal Pain    Ruth Gutierrez is a 54 y.o. female.  54yo female presents with complaint of abdominal pain x 1 week, charged on March 8 with diagnosis of peritonitis, states feels the same.  Patient called her nephrologist and was advised to come to the emergency room.  Patient reports nausea without vomiting, denies fevers, chills, sweats.  No changes in bowel habits, does not make urine.  Patient has been able to continue with her peritoneal dialysis nightly at home, states that she thought her catheter was clogged but feels like it become unclogged at some point and has been able to complete her sessions.        Past Medical History:  Diagnosis Date  . Anemia of chronic disease   . Arthritis   . Deceased-donor kidney transplant    Performed at [facility], April 2010.  Initial ESRD due to HTN nephropathy  . Eczema   . ESRD (end stage renal disease) ([place])    s/p transplant creatinine baseline 1.1  M/W/F dialysis  . FUO (fever of unknown origin) 05/17/2015  . GERD (gastroesophageal reflux disease)   . Headache(784.0)   . History of hyperparathyroidism   . Hypertension   . Peritonitis ([place]) 10/2019  . Shortness of breath   . Wears glasses     Patient Active Problem List   Diagnosis Date Noted  . Infection due to acinetobacter baumannii   . Peritonitis ([place]) 10/04/2019  . PD catheter dysfunction ([place])   . Renal dialysis device, implant, or graft complication 19/37/9024  . ESRD on peritoneal dialysis ([place])   . HCAP (healthcare-associated pneumonia)   . Sepsis ([place]) 11/12/2015  . Pneumonia 11/12/2015  . FUO (fever of unknown origin) 05/17/2015  . Acute renal failure ([place]) 04/05/2013  . Diarrhea 04/05/2013  . Acute pyelonephritis 02/02/2013  . Viral gastroenteritis 02/02/2013  . Sepsis  due to Klebsiella pneumoniae ([place]) 06/02/2011  . Fatigue 06/02/2011  . Tobacco abuse 06/02/2011  . Iron deficiency anemia 04/04/2011  . Hypertension 04/04/2011  . S/P kidney transplant 04/04/2011  . Clotted renal dialysis AV graft ([place]) 04/04/2011  . Fibroids 04/04/2011  . Preventative health care 04/04/2011    Past Surgical History:  Procedure Laterality Date  . A/V FISTULAGRAM Left 02/12/2017   Procedure: A/V Fistulagram;  Surgeon: Algernon Huxley, MD;  Location: [place] [facility];  Service: Cardiovascular;  Laterality: Left;  . A/V FISTULAGRAM Left 12/30/2017   Procedure: A/V FISTULAGRAM;  Surgeon: Algernon Huxley, MD;  Location: [place] [facility];  Service: Cardiovascular;  Laterality: Left;  . A/V SHUNT INTERVENTION N/A 02/12/2017   Procedure: A/V Shunt Intervention;  Surgeon: Algernon Huxley, MD;  Location: [place] [facility];  Service: Cardiovascular;  Laterality: N/A;  . AV FISTULA PLACEMENT    . BASCILIC VEIN TRANSPOSITION Left 10/30/2014   Procedure: LEFT BASCILIC VEIN TRANSPOSITION;  Surgeon: Rosetta Posner, MD;  Location: [place];  Service: Vascular;  Laterality: Left;  . BASCILIC VEIN TRANSPOSITION Left 01/03/2015   Procedure: LEFT ARM 2ND STAGE BASCILIC VEIN TRANSPOSITION;  Surgeon: Rosetta Posner, MD;  Location: [place];  Service: Vascular;  Laterality: Left;  . FRACTURE SURGERY     left foot,baby toe nad next toe missing  . INSERTION OF DIALYSIS CATHETER Right 10/30/2014   Procedure: INSERTION OF  DIALYSIS CATHETER;  Surgeon: Rosetta Posner, MD;  Location: [place];  Service: Vascular;  Laterality: Right;  . KIDNEY TRANSPLANT  11/2008   [facility])  . WISDOM TOOTH EXTRACTION       OB History   No obstetric history on file.     Family History  Problem Relation Age of Onset  . Hypertension Mother   . Hypertension Father   . Diabetes Brother   . Deep vein thrombosis Brother   . Hypertension Sister   . Hyperlipidemia Sister   . Kidney disease Brother        on HD  .  Hypertension Sister   . Colon cancer Neg Hx   . Esophageal cancer Neg Hx   . Rectal cancer Neg Hx     Social History   Tobacco Use  . Smoking status: Current Every Day Smoker    Packs/day: 0.50    Years: 27.00    Pack years: 13.50    Types: Cigarettes  . Smokeless tobacco: Never Used  . Tobacco comment: 10 cigarettes a day  Substance Use Topics  . Alcohol use: No    Alcohol/week: 0.0 standard drinks    Comment: occasional drinker noted in the past  . Drug use: No    Home Medications Prior to Admission medications   Medication Sig Start Date End Date Taking? Authorizing Provider  aspirin (BAYER ASPIRIN EC LOW DOSE) 81 MG EC tablet Take 81 mg by mouth as directed. 05/15/15  Yes [provider]  calcitRIOL (ROCALTROL) 0.25 MCG capsule Take 0.25 mcg by mouth daily.   Yes [provider]  cinacalcet (SENSIPAR) 90 MG tablet Take 90 mg by mouth daily.  11/26/18  Yes [provider]  omeprazole (PRILOSEC OTC) 20 MG tablet Take 20 mg by mouth daily as needed for heartburn. 08/23/18  Yes [provider]  polyethylene glycol (MIRALAX / GLYCOLAX) 17 g packet Take 17 g by mouth daily. Patient taking differently: Take 17 g by mouth daily as needed for mild constipation.  10/11/19  Yes Seawell, Jaimie A, DO  potassium chloride SA (KLOR-CON) 20 MEQ tablet Take 2 tablets (40 mEq total) by mouth daily. 10/11/19  Yes Seawell, Jaimie A, DO  sucroferric oxyhydroxide (VELPHORO) 500 MG chewable tablet Chew 1,500 mg by mouth 3 (three) times daily with meals. Taking one tablet with a snack sometimes 08/27/18  Yes [provider]  calcitRIOL (ROCALTROL) 0.5 MCG capsule Take 1 capsule (0.5 mcg total) by mouth every Monday, Wednesday, and Friday with hemodialysis. Patient not taking: Reported on 10/28/2019 11/16/15   Iline Oven, MD  ciprofloxacin (CIPRO) 500 MG tablet Take 500 mg by mouth in the morning and at bedtime. 10/27/19   [provider]     Allergies    Penicillin g and Penicillins  Review of Systems   Review of Systems  Constitutional: Negative for fever.  Respiratory: Negative for shortness of breath.   Cardiovascular: Negative for chest pain.  Gastrointestinal: Positive for abdominal pain and nausea. Negative for constipation, diarrhea and vomiting.  Genitourinary:       Does not make urine  Musculoskeletal: Negative for arthralgias and myalgias.  Skin: Negative for wound.  Neurological: Negative for weakness.  All other systems reviewed and are negative.   Physical Exam Updated Vital Signs BP 112/79   Pulse (!) 105   Temp 97.8 F (36.6 C) (Oral)   Resp 10   Ht 5\' 7"  (1.702 m)   Wt 73.5 kg  LMP 06/02/2015   SpO2 98%   BMI 25.38 kg/m   Physical Exam Vitals and nursing Gutierrez reviewed.  Constitutional:      General: She is not in acute distress.    Appearance: She is well-developed. She is not diaphoretic.  HENT:     Head: Normocephalic and atraumatic.  Cardiovascular:     Rate and Rhythm: Regular rhythm. Tachycardia present.     Heart sounds: Normal heart sounds.  Pulmonary:     Effort: Pulmonary effort is normal.     Breath sounds: Normal breath sounds.  Abdominal:     Palpations: Abdomen is soft.     Tenderness: There is generalized abdominal tenderness. There is guarding.  Skin:    General: Skin is warm and dry.     Findings: No erythema or rash.  Neurological:     Mental Status: She is alert and oriented to person, place, and time.  Psychiatric:        Behavior: Behavior normal.     ED Results / Procedures / Treatments   Labs (all labs ordered are listed, but only abnormal results are displayed) Labs Reviewed  COMPREHENSIVE METABOLIC PANEL - Abnormal; Notable for the following components:      Result Value   Sodium 132 (*)    Potassium 2.7 (*)    Chloride 86 (*)    BUN 46 (*)    Creatinine, Ser 12.03 (*)    Total Protein 6.4 (*)    Albumin 1.9 (*)    Alkaline Phosphatase  162 (*)    Total Bilirubin 4.4 (*)    GFR calc non Af Amer 3 (*)    GFR calc Af Amer 4 (*)    Anion gap 21 (*)    All other components within normal limits  BODY FLUID CELL COUNT WITH DIFFERENTIAL - Abnormal; Notable for the following components:   Color, Fluid STRAW (*)    Appearance, Fluid CLOUDY (*)    Total Nucleated Cell Count, Fluid 3,175 (*)    Neutrophil Count, Fluid 97 (*)    Monocyte-Macrophage-Serous Fluid 1 (*)    All other components within normal limits  CULTURE, BLOOD (ROUTINE X 2)  CULTURE, BLOOD (ROUTINE X 2)  BODY FLUID CULTURE  SARS CORONAVIRUS 2 (TAT 6-24 HRS)  LIPASE, BLOOD  CBC  LACTIC ACID, PLASMA  CBC  RENAL FUNCTION PANEL    EKG EKG Interpretation  Date/Time:  Friday October 28 2019 12:45:31 EDT Ventricular Rate:  104 PR Interval:    QRS Duration: 88 QT Interval:  383 QTC Calculation: 504 R Axis:   -3 Text Interpretation: Sinus tachycardia Abnormal R-wave progression, early transition Borderline prolonged QT interval No STEMI Confirmed by Octaviano Glow [id number]) on 10/28/2019 12:58:50 PM   Radiology CT ABDOMEN PELVIS W CONTRAST  Result Date: 10/28/2019 CLINICAL DATA:  Abdominal abscesses. Assess for peritonitis or perforation. EXAM: CT ABDOMEN AND PELVIS WITH CONTRAST TECHNIQUE: Multidetector CT imaging of the abdomen and pelvis was performed using the standard protocol following bolus administration of intravenous contrast. CONTRAST:  145mL OMNIPAQUE IOHEXOL 300 MG/ML  SOLN COMPARISON:  None. FINDINGS: Lower chest: Consolidation of the anterior left lower lobe is identified. Atelectasis of bilateral lung bases are noted. The heart size is normal. Hepatobiliary: No focal liver abnormality is seen. No gallstones, gallbladder wall thickening, or biliary dilatation. Pancreas: Unremarkable. No pancreatic ductal dilatation or surrounding inflammatory changes. Spleen: Normal in size without focal abnormality. Adrenals/Urinary Tract: Bilateral adrenal glands are  normal. Atrophy of bilateral kidneys are  noted. The catheter is identified in the bladder. Stomach/Bowel: There is no bowel obstruction. Thick wall small bowel loops are identified in the upper abdomen. There is free air. Postsurgical changes are identified in the right lower quadrant. There is diverticulosis of colon. Vascular/Lymphatic: Aortic atherosclerosis. No enlarged abdominal or pelvic lymph nodes. Reproductive: Calcified uterine fibroids are noted. Other: Small amount of ascites is noted in the abdomen and pelvis. Musculoskeletal: Degenerative joint changes of the spine are noted. IMPRESSION: 1. Thick wall small bowel loops are identified in the upper abdomen. There is free air in the abdomen. The findings are suspicious for bowel perforation. 2. Small amount of ascites in the abdomen and pelvis. 3. Consolidation of the anterior left lower lobe, pneumonia is not excluded. Aortic Atherosclerosis (ICD10-I70.0). These results will be called to the ordering clinician or representative by the Radiologist Assistant, and communication documented in the PACS or [employer]. Electronically Signed   By: Abelardo Diesel M.D.   On: 10/28/2019 17:10   DG Chest Port 1 View  Result Date: 10/28/2019 CLINICAL DATA:  Shortness of breath. EXAM: PORTABLE CHEST 1 VIEW COMPARISON:  03/02/2019 FINDINGS: Vague densities at both lung bases and new from the previous examination. The upper lungs are clear. Negative for a pneumothorax. Again noted is a vascular stent in the left axilla. Heart size is within normal limits. Patient is slightly rotated on the examination. IMPRESSION: Subtle basilar chest densities bilaterally. Findings could represent atelectasis and/or small pleural effusions. No evidence for pulmonary edema. Electronically Signed   By: Markus Daft M.D.   On: 10/28/2019 12:09    Procedures .Critical Care Performed by: Tacy Learn, PA-C Authorized by: Tacy Learn, PA-C   Critical care provider  statement:    Critical care time (minutes):  45   Critical care was time spent personally by me on the following activities:  Discussions with consultants, evaluation of patient's response to treatment, examination of patient, ordering and performing treatments and interventions, ordering and review of laboratory studies, ordering and review of radiographic studies, pulse oximetry, re-evaluation of patient's condition, obtaining history from patient or surrogate and review of old charts   (including critical care time)  Medications Ordered in ED Medications  sodium chloride flush (NS) 0.9 % injection 3 mL (3 mLs Intravenous Not Given 10/28/19 1145)  HYDROmorphone (DILAUDID) injection 1 mg (1 mg Intravenous Given 10/28/19 1531)  potassium chloride 10 mEq in 100 mL IVPB (10 mEq Intravenous New Bag/Given 10/28/19 1717)  feeding supplement (PRO-STAT SUGAR FREE 64) liquid 30 mL (has no administration in time range)  Chlorhexidine Gluconate Cloth 2 % PADS 6 each (has no administration in time range)  meropenem (MERREM) 1 g in sodium chloride 0.9 % 100 mL IVPB (has no administration in time range)  aspirin EC tablet 81 mg (has no administration in time range)  calcitRIOL (ROCALTROL) capsule 0.25 mcg (has no administration in time range)  cinacalcet (SENSIPAR) tablet 90 mg (has no administration in time range)  heparin injection 5,000 Units (has no administration in time range)  ondansetron (ZOFRAN) injection 4 mg (4 mg Intravenous Given 10/28/19 1238)  iohexol (OMNIPAQUE) 300 MG/ML solution 100 mL (100 mLs Intravenous Contrast Given 10/28/19 1702)    ED Course  I have reviewed the triage vital signs and the nursing notes.  Pertinent labs & imaging results that were available during my care of the patient were reviewed by me and considered in my medical decision making (see chart for details).  Clinical Course  as of Oct 27 1745  Fri Oct 28, 2019  1203 Patient seen by myself as well as PA provider.   Briefly is a 54 year old female with history of peritoneal dialysis and hospitalization 3 weeks ago for peritonitis, treated with extensive course of antibiotics, presenting to the emergency department with abdominal pain.  She reports feels exactly like her peritonitis returning.  She has been dialyzing herself daily as prescribed.  She does not make any urine.  On exam she appears tired.  She is afebrile but diffusely tender in her abdomen.  Concerning for recurrent peritonitis.  She will need labs, we will see if we can get a dialysis nurse to draw fluid off of her catheter report.  I anticipate this patient will need antibiotics and admission again.   [MT]  1415 Case discussed with Dr. Posey Pronto with nephrology, recommends vancomycin and Tressie Ellis.  Will consult hospitalist for admission, patient was discharged from internal medicine service earlier this month.   [LM]  1655 Discussed with internal medicine service who will consult for admission.   [LM]  1745 Critical result call from radiology, free air in the abdomen concerning for L perforation.  Discussed with radiologist, patient has a peritoneal dialysis catheter in place which has been flushed and fluid withdrawn for cultures today, could the free air be related to manipulating patient's peritoneal dialysis catheter?  Patient is afebrile with normal white count with report of pain x1 week.  Radiology reports catheter is low in the pelvis while the free air is higher up under the diaphragm and feels air is more than would be expected due to her catheter. Case discussed with Dr. Redmond Pulling with general surgery who will consult on patient.  Admitting team updated with plan of care.   [LM]    Clinical Course User Index [LM] Tacy Learn, PA-C [MT] Langston Masker Carola Rhine, MD   MDM Rules/Calculators/A&P                      Final Clinical Impression(s) / ED Diagnoses Final diagnoses:  Peritonitis [facility])  Hypokalemia  Bowel perforation [facility]    Rx / DC  Orders ED Discharge Orders    None       Tacy Learn, PA-C 10/28/19 1515    Tacy Learn, PA-C 10/28/19 1747    Wyvonnia Dusky, MD 10/28/19 [phone]

## 2019-10-28 NOTE — Consult Note (Addendum)
[facility] Consult Note  Ruth Gutierrez [id number] 1965-12-30  [id number].    Requesting MD: Ruth Grippe, MD Chief Complaint/Reason for Consult: PD catheter removal   HPI:  Ruth Gutierrez is a 54 y.o. female with ESRD on CCPD at home, HTN, GERD, and recent hospitalization for acinetobacter peritonitis (10/03/19-10/10/19) who is being admitted with recurrent v. persistent peritonitis. She completed a 2 week course of IV ceftazidime (10/03/19-10/17/19) for PD peritonitis but developed recurrent pain and cloudy peritoneal fluid. She was placed back on IV ceftazidime without improvement. Denies use of blood thinners - takes a baby ASA at home. Past abdominal surgeries include prior renal transplant, PD catheter insertion in 2019 at [facility]   Her symptoms had improved after the 2 weeks of antibiotics but her symptoms recurred within a few days of completing her 2-week course.  She had persistent abdominal discomfort and overall felt bad so she came to the emergency room.  She did use her PD catheter last night.  She states that her fluid has been cloudy but denies any green or brown-tinged fluid.  She denies any NSAID use.  She does smoke.  A pack lasts about a week.  Peritoneal cultures are currently growing GNRs. CT abdomen and pelvis some thickened small bowel loops and some areas of free air with small amount of ascites in the pelvis.   ROS: Review of Systems  Constitutional: Negative for chills, diaphoresis, fever and weight loss.  HENT: Negative.   Eyes: Negative.   Respiratory: Negative.   Cardiovascular: Negative.   Gastrointestinal: Positive for abdominal pain. Negative for constipation, nausea and vomiting.  Genitourinary:       Anuric  Musculoskeletal: Negative.   Skin: Negative for itching and rash.  Neurological: Negative.   Psychiatric/Behavioral: Negative.   Otherwise a 12 point review of systems is negative except for what is mentioned above  Family  History  Problem Relation Age of Onset  . Hypertension Mother   . Hypertension Father   . Diabetes Brother   . Deep vein thrombosis Brother   . Hypertension Sister   . Hyperlipidemia Sister   . Kidney disease Brother        on HD  . Hypertension Sister   . Colon cancer Neg Hx   . Esophageal cancer Neg Hx   . Rectal cancer Neg Hx     Past Medical History:  Diagnosis Date  . Anemia of chronic disease   . Arthritis   . Deceased-donor kidney transplant    Performed at [facility], April 2010.  Initial ESRD due to HTN nephropathy  . Eczema   . ESRD (end stage renal disease) ([place])    s/p transplant creatinine baseline 1.1  M/W/F dialysis  . FUO (fever of unknown origin) 05/17/2015  . GERD (gastroesophageal reflux disease)   . Headache(784.0)   . History of hyperparathyroidism   . Hypertension   . Peritonitis ([place]) 10/2019  . Shortness of breath   . Wears glasses     Past Surgical History:  Procedure Laterality Date  . A/V FISTULAGRAM Left 02/12/2017   Procedure: A/V Fistulagram;  Surgeon: Algernon Huxley, MD;  Location: [place] [facility];  Service: Cardiovascular;  Laterality: Left;  . A/V FISTULAGRAM Left 12/30/2017   Procedure: A/V FISTULAGRAM;  Surgeon: Algernon Huxley, MD;  Location: [place] [facility];  Service: Cardiovascular;  Laterality: Left;  . A/V SHUNT INTERVENTION N/A 02/12/2017   Procedure: A/V Shunt Intervention;  Surgeon: Algernon Huxley,  MD;  Location: [place] [facility];  Service: Cardiovascular;  Laterality: N/A;  . AV FISTULA PLACEMENT    . BASCILIC VEIN TRANSPOSITION Left 10/30/2014   Procedure: LEFT BASCILIC VEIN TRANSPOSITION;  Surgeon: Rosetta Posner, MD;  Location: [place];  Service: Vascular;  Laterality: Left;  . BASCILIC VEIN TRANSPOSITION Left 01/03/2015   Procedure: LEFT ARM 2ND STAGE BASCILIC VEIN TRANSPOSITION;  Surgeon: Rosetta Posner, MD;  Location: [name];  Service: Vascular;  Laterality: Left;  . FRACTURE SURGERY     left foot,baby toe nad next toe  missing  . INSERTION OF DIALYSIS CATHETER Right 10/30/2014   Procedure: INSERTION OF DIALYSIS CATHETER;  Surgeon: Rosetta Posner, MD;  Location: [place];  Service: Vascular;  Laterality: Right;  . KIDNEY TRANSPLANT  11/2008   [facility])  . WISDOM TOOTH EXTRACTION      Social History:  reports that she has been smoking cigarettes. She has a 13.50 pack-year smoking history. She has never used smokeless tobacco. She reports that she does not drink alcohol or use drugs.  Allergies:  Allergies  Allergen Reactions  . Penicillin G Itching and Rash  . Penicillins Itching and Rash    Did it involve swelling of the face/tongue/throat, SOB, or low BP?Y Did it involve sudden or severe rash/hives, skin peeling, or any reaction on the inside of your mouth or nose? Y Did you need to seek medical attention at a hospital or doctor's office? Y When did it last happen?2016 If all above answers are "NO", may proceed with cephalosporin use.    (Not in a hospital admission)   Blood pressure 114/85, pulse (!) 106, temperature 97.8 F (36.6 C), temperature source Oral, resp. rate 12, height 5\' 7"  (1.702 m), weight 73.5 kg, last menstrual period 06/02/2015, SpO2 97 %. Physical Exam: Physical Exam  Constitutional: NAD; a little lethargic; no deformities Eyes: Moist conjunctiva; no lid lag; anicteric; PERRL Neck: Trachea midline; no thyromegaly Lungs: Normal respiratory effort; no tactile fremitus CV: RRR; no palpable thrills; no pitting edema GI: Abd soft, not really distended; old RLQ incision, PD catheter, mild TTP more so in mid-upper abd. Not rigid/no rebound; no palpable hepatosplenomegaly MSK: no clubbing/cyanosis, Free range of motion all extremities; Left upper extremity - AV fistula - palpable thrill, FROM, no deformity Psychiatric: flat affect; drowsy but and oriented x3 Lymphatic: No palpable cervical or axillary lymphadenopathy Skin: no rash, no induration, no lesions; old scars  from temp dialysis line in chest area  Results for orders placed or performed during the hospital encounter of 10/28/19 (from the past 48 hour(s))  Lipase, blood     Status: None   Collection Time: 10/28/19 11:14 AM  Result Value Ref Range   Lipase 18 11 - 51 U/L    Comment: Performed at [facility], [place] [address], [place], [place] [address]  Comprehensive metabolic panel     Status: Abnormal   Collection Time: 10/28/19 11:14 AM  Result Value Ref Range   Sodium 132 (L) 135 - 145 mmol/L   Potassium 2.7 (LL) 3.5 - 5.1 mmol/L    Comment: CRITICAL RESULT CALLED TO, READ BACK BY AND VERIFIED WITH: Ardyth Gal 1317 10/28/2019 D BRADLEY    Chloride 86 (L) 98 - 111 mmol/L   CO2 25 22 - 32 mmol/L   Glucose, Bld 93 70 - 99 mg/dL    Comment: Glucose reference range applies only to samples taken after fasting for at least 8 hours.  BUN 46 (H) 6 - 20 mg/dL   Creatinine, Ser 12.03 (H) 0.44 - 1.00 mg/dL   Calcium 9.5 8.9 - 10.3 mg/dL   Total Protein 6.4 (L) 6.5 - 8.1 g/dL   Albumin 1.9 (L) 3.5 - 5.0 g/dL   AST 16 15 - 41 U/L   ALT 17 0 - 44 U/L   Alkaline Phosphatase 162 (H) 38 - 126 U/L   Total Bilirubin 4.4 (H) 0.3 - 1.2 mg/dL   GFR calc non Af Amer 3 (L) >60 mL/min   GFR calc Af Amer 4 (L) >60 mL/min   Anion gap 21 (H) 5 - 15    Comment: Performed at [facility], [place] [address] [place], [place] [address]  CBC     Status: None   Collection Time: 10/28/19 11:14 AM  Result Value Ref Range   WBC 9.5 4.0 - 10.5 K/uL   RBC 3.95 3.87 - 5.11 MIL/uL   Hemoglobin 12.9 12.0 - 15.0 g/dL   HCT 38.2 36.0 - 46.0 %   MCV 96.7 80.0 - 100.0 fL   MCH 32.7 26.0 - 34.0 pg   MCHC 33.8 30.0 - 36.0 g/dL   RDW 13.2 11.5 - 15.5 %   Platelets 254 150 - 400 K/uL   nRBC 0.0 0.0 - 0.2 %    Comment: Performed at [facility], [place] [address], [place], [place] [address]  Lactic acid, plasma     Status: None   Collection Time: 10/28/19 12:35 PM  Result Value Ref Range   Lactic Acid, Venous  1.1 0.5 - 1.9 mmol/L    Comment: Performed at [place] [address] [place], [place] [address]  Body fluid cell count with differential     Status: Abnormal   Collection Time: 10/28/19  4:14 PM  Result Value Ref Range   Fluid Type-FCT Peritoneal    Color, Fluid STRAW (A) YELLOW   Appearance, Fluid CLOUDY (A) CLEAR   Total Nucleated Cell Count, Fluid 3,175 (H) 0 - 1,000 cu mm   Neutrophil Count, Fluid 97 (H) 0 - 25 %   Lymphs, Fluid 1 %   Monocyte-Macrophage-Serous Fluid 1 (L) 50 - 90 %   Eos, Fluid 1 %    Comment: Performed at [facility] [address], [name],  [address]   CT ABDOMEN PELVIS W CONTRAST  Result Date: 10/28/2019 CLINICAL DATA:  Abdominal abscesses. Assess for peritonitis or perforation. EXAM: CT ABDOMEN AND PELVIS WITH CONTRAST TECHNIQUE: Multidetector CT imaging of the abdomen and pelvis was performed using the standard protocol following bolus administration of intravenous contrast. CONTRAST:  156mL OMNIPAQUE IOHEXOL 300 MG/ML  SOLN COMPARISON:  None. FINDINGS: Lower chest: Consolidation of the anterior left lower lobe is identified. Atelectasis of bilateral lung bases are noted. The heart size is normal. Hepatobiliary: No focal liver abnormality is seen. No gallstones, gallbladder wall thickening, or biliary dilatation. Pancreas: Unremarkable. No pancreatic ductal dilatation or surrounding inflammatory changes. Spleen: Normal in size without focal abnormality. Adrenals/Urinary Tract: Bilateral adrenal glands are normal. Atrophy of bilateral kidneys are noted. The catheter is identified in the bladder. Stomach/Bowel: There is no bowel obstruction. Thick wall small bowel loops are identified in the upper abdomen. There is free air. Postsurgical changes are identified in the right lower quadrant. There is diverticulosis of colon. Vascular/Lymphatic: Aortic atherosclerosis. No enlarged abdominal or pelvic lymph nodes. Reproductive: Calcified uterine  fibroids are noted. Other: Small amount of ascites is noted  in the abdomen and pelvis. Musculoskeletal: Degenerative joint changes of the spine are noted. IMPRESSION: 1. Thick wall small bowel loops are identified in the upper abdomen. There is free air in the abdomen. The findings are suspicious for bowel perforation. 2. Small amount of ascites in the abdomen and pelvis. 3. Consolidation of the anterior left lower lobe, pneumonia is not excluded. Aortic Atherosclerosis (ICD10-I70.0). These results will be called to the ordering clinician or representative by the Radiologist Assistant, and communication documented in the PACS or [employer]. Electronically Signed   By: Abelardo Diesel M.D.   On: 10/28/2019 17:10   DG Chest Port 1 View  Result Date: 10/28/2019 CLINICAL DATA:  Shortness of breath. EXAM: PORTABLE CHEST 1 VIEW COMPARISON:  03/02/2019 FINDINGS: Vague densities at both lung bases and new from the previous examination. The upper lungs are clear. Negative for a pneumothorax. Again noted is a vascular stent in the left axilla. Heart size is within normal limits. Patient is slightly rotated on the examination. IMPRESSION: Subtle basilar chest densities bilaterally. Findings could represent atelectasis and/or small pleural effusions. No evidence for pulmonary edema. Electronically Signed   By: Markus Daft M.D.   On: 10/28/2019 12:09    Assessment/Plan  ESRD - nephrology consulted and transitioning to HD tomorrow vis LUE AVF Hypokalemia  HTN Anemia  Protein calorie malnutrition - alb 1.9  Peritonitis in the setting of daily PD with recent admission for acinetobacter peritonitis  - afebrile, WBC 9.5 -CT shows some free air as well as some thickened small bowel loops and some fluid in the pelvis and may be an area of consolidation in the lung.  I personally reviewed the CT  She is not toxic appearing and is not in distress.  She appears chronically unwell.  She does not have peritonitis.  She  has a normal white blood cell count.  She is not febrile.  Her PD catheter was instrumented earlier today prior to her going to CT so it is possible that the air could be due to that. Can see thickened small bowel walls with peritoneal dialysis. Also possible her persistent PD catheter infection could be due to underlying bowel pathology although her cultures from her PD cath aren't c/w a enteric source.   Since she is stable will repeat CT a/p with ORAL contrast to see if extravasation. D/w with radiology  - cont abx -o/w NPO x ice chips until ct results back  Leighton Ruff. Redmond Pulling, MD, FACS General, Bariatric, & Minimally Invasive Surgery [facility], [place]

## 2019-10-28 NOTE — ED Notes (Signed)
Dialysis nurse at bedside to instill fluid into peritoneal cavity to obtain sample for culture. Will return in approx 1hr to obtain sample.

## 2019-10-28 NOTE — ED Triage Notes (Signed)
Patient stated was sent by PCP; concern for peritonitis. Stated does peritoneal dialysis daily and has had some increasing abdominal pain x1 week.

## 2019-10-28 NOTE — Progress Notes (Signed)
Pharmacy Antibiotic Note  Ruth Gutierrez is a 54 y.o. female admitted on 10/28/2019 with Peritonitis.  Pharmacy has been consulted for Ceftazidime and vancomycin dosing.   Height: 5\' 7"  (170.2 cm) Weight: 162 lb 0.6 oz (73.5 kg) IBW/kg (Calculated) : 61.6  Temp (24hrs), Avg:97.8 F (36.6 C), Min:97.8 F (36.6 C), Max:97.8 F (36.6 C)  Recent Labs  Lab 10/28/19 1114 10/28/19 1235  WBC 9.5  --   CREATININE 12.03*  --   LATICACIDVEN  --  1.1    Estimated Creatinine Clearance: 5.3 mL/min (A) (by C-G formula based on SCr of 12.03 mg/dL (H)).    Allergies  Allergen Reactions  . Penicillin G Itching and Rash  . Penicillins Itching and Rash    Did it involve swelling of the face/tongue/throat, SOB, or low BP?Y Did it involve sudden or severe rash/hives, skin peeling, or any reaction on the inside of your mouth or nose? Y Did you need to seek medical attention at a hospital or doctor's office? Y When did it last happen?2016 If all above answers are "NO", may proceed with cephalosporin use.    Antimicrobials this admission: 3/26 Ceftazidime >>  3/26 Vancomycin >>   Dose adjustments this admission: N/a  Microbiology results: Pending   Plan:  - Ceftazidime 1.5g IP daily with overnight dialysis  - Vancomycin 2200 mg IP x 1 dose with overnight dialysis  - Will obtain vancomycin serum level in ~ 3-4 days and redose when vancomycin level is < 20mg /L  - Monitor patients urine output to determine if levels needs to be checked sooner  - De-escalate ABX when appropriate   Thank you for allowing pharmacy to be a part of this patient's care.  Duanne Limerick PharmD. BCPS 10/28/2019 2:46 PM

## 2019-10-28 NOTE — Consult Note (Signed)
[facility] Renal Consultation Note    Indication for Consultation:  Management of ESRD/hemodialysis, anemia, hypertension/volume, and secondary hyperparathyroidism. PCP:  HPI: Ruth Gutierrez is a 54 y.o. female with ESRD (Hx DDKT/failed, on CCPD), HTN, GERD, and recent hospitalization for peritonitis who is being admitted with recurrent v. persistent peritonitis.  S/p [facility] admit 3/1-10/10/19 with Acinetobacter peritonitis. She was initially on Ceftazidime, then transitioned to meropenem x 7d during admit which improved her symptoms with gradual decline in cell count (2230 -> 7). She was discharged home with plan for additional 7-d of Ceftazidime which was infused intraperitoneally daily at her dialysis unit with an end date of 3/16. She presented to the unit again on 3/22 and told the RN that she had developed severe abdominal pain and cloudy effluent again for the past few days (so essentially within days of stopping the Ceftazidime). PD fluid Cx, gram stain, cell count collected and she was restarted on IP Ceftazidime on 3/22. On 3/24, the results came back - cell count 1985 and Cx growing gram negative bacilli. IP Vancomycin was added to her abx regimen. Her pain persistent. On 3/25, she was given IP Vanc again - decision made to stop the Ceftazidime and do PO Cipro since she was not improving. Ultimately, she was advised to go to the ED for further management.  In ED today, she is ill-appearing. Denies systemic fever, chills, vomiting, or diarrhea. Has had nausea. Abdominal pain is generalized - wraps to B flank and lower back. Labs today show K 2.7, Alb 1.9, LA 1.1, WBC 9.5, Hgb 12.9. Blood Cx and repeat PD Fluid Cx drawn and she was started on IV Vanc/Ceftazidime.  Last PD treatment was last evening. Denies CP. Has been having some mild SOB. No LE edema. No drainage from around her PD cath. She does still have a LUE AVF which appears patent with good thrill.  Past Medical  History:  Diagnosis Date  . Anemia of chronic disease   . Arthritis   . Deceased-donor kidney transplant    Performed at [facility], April 2010.  Initial ESRD due to HTN nephropathy  . Eczema   . ESRD (end stage renal disease) ([place])    s/p transplant creatinine baseline 1.1  M/W/F dialysis  . FUO (fever of unknown origin) 05/17/2015  . GERD (gastroesophageal reflux disease)   . Headache(784.0)   . History of hyperparathyroidism   . Hypertension   . Peritonitis ([place]) 10/2019  . Shortness of breath   . Wears glasses    Past Surgical History:  Procedure Laterality Date  . A/V FISTULAGRAM Left 02/12/2017   Procedure: A/V Fistulagram;  Surgeon: Algernon Huxley, MD;  Location: [place] [facility];  Service: Cardiovascular;  Laterality: Left;  . A/V FISTULAGRAM Left 12/30/2017   Procedure: A/V FISTULAGRAM;  Surgeon: Algernon Huxley, MD;  Location: [place] [facility];  Service: Cardiovascular;  Laterality: Left;  . A/V SHUNT INTERVENTION N/A 02/12/2017   Procedure: A/V Shunt Intervention;  Surgeon: Algernon Huxley, MD;  Location: [place] [facility];  Service: Cardiovascular;  Laterality: N/A;  . AV FISTULA PLACEMENT    . BASCILIC VEIN TRANSPOSITION Left 10/30/2014   Procedure: LEFT BASCILIC VEIN TRANSPOSITION;  Surgeon: Rosetta Posner, MD;  Location: [place];  Service: Vascular;  Laterality: Left;  . BASCILIC VEIN TRANSPOSITION Left 01/03/2015   Procedure: LEFT ARM 2ND STAGE BASCILIC VEIN TRANSPOSITION;  Surgeon: Rosetta Posner, MD;  Location: [place];  Service: Vascular;  Laterality:  Left;  . FRACTURE SURGERY     left foot,baby toe nad next toe missing  . INSERTION OF DIALYSIS CATHETER Right 10/30/2014   Procedure: INSERTION OF DIALYSIS CATHETER;  Surgeon: Rosetta Posner, MD;  Location: [place];  Service: Vascular;  Laterality: Right;  . KIDNEY TRANSPLANT  11/2008   [facility])  . WISDOM TOOTH EXTRACTION     Family History  Problem Relation Age of Onset  . Hypertension Mother   . Hypertension  Father   . Diabetes Brother   . Deep vein thrombosis Brother   . Hypertension Sister   . Hyperlipidemia Sister   . Kidney disease Brother        on HD  . Hypertension Sister   . Colon cancer Neg Hx   . Esophageal cancer Neg Hx   . Rectal cancer Neg Hx    Social History:  reports that she has been smoking cigarettes. She has a 13.50 pack-year smoking history. She has never used smokeless tobacco. She reports that she does not drink alcohol or use drugs.  ROS: As per HPI otherwise negative.  Physical Exam: Vitals:   10/28/19 1345 10/28/19 1400 10/28/19 1415 10/28/19 1430  BP: (!) 144/88 (!) 141/91 123/84 131/81  Pulse:      Resp: 16 (!) 22 16 15   Temp:      TempSrc:      SpO2:  97%  97%  Weight:      Height:         General: Ill appearing woman, NAD. On room air. Head: Normocephalic, atraumatic, sclera non-icteric, mucus membranes are moist. Neck: Supple without lymphadenopathy/masses.  Lungs: Clear in upper lobes. Bibasilar rales noted. Heart: Tachycardic, no murmur Abdomen: Soft, generalized mild tenderness without rebound tenderness or guarding. PD cath in mid R abdomen. Lower extremities: No edema or ischemic changes, no open wounds. Neuro: Alert and oriented X 3, although somewhat lethargic. Moves all extremities spontaneously. Dialysis Access: PD cath in R abdomen. LUE AVF + thrill, hypopigmentation overlying aneurysmal segment.  Allergies  Allergen Reactions  . Penicillin G Itching and Rash  . Penicillins Itching and Rash    Did it involve swelling of the face/tongue/throat, SOB, or low BP?Y Did it involve sudden or severe rash/hives, skin peeling, or any reaction on the inside of your mouth or nose? Y Did you need to seek medical attention at a hospital or doctor's office? Y When did it last happen?2016 If all above answers are "NO", may proceed with cephalosporin use.   Prior to Admission medications   Medication Sig Start Date End Date Taking?  Authorizing Provider  aspirin (BAYER ASPIRIN EC LOW DOSE) 81 MG EC tablet Take 81 mg by mouth as directed. 05/15/15  Yes [provider]  calcitRIOL (ROCALTROL) 0.25 MCG capsule Take 0.25 mcg by mouth daily.   Yes [provider]  cinacalcet (SENSIPAR) 90 MG tablet Take 90 mg by mouth daily.  11/26/18  Yes [provider]  omeprazole (PRILOSEC OTC) 20 MG tablet Take 20 mg by mouth daily as needed for heartburn. 08/23/18  Yes [provider]  polyethylene glycol (MIRALAX / GLYCOLAX) 17 g packet Take 17 g by mouth daily. Patient taking differently: Take 17 g by mouth daily as needed for mild constipation.  10/11/19  Yes Seawell, Jaimie A, DO  potassium chloride SA (KLOR-CON) 20 MEQ tablet Take 2 tablets (40 mEq total) by mouth daily. 10/11/19  Yes Seawell, Jaimie A, DO  sucroferric oxyhydroxide (VELPHORO) 500 MG chewable  tablet Chew 1,500 mg by mouth 3 (three) times daily with meals. Taking one tablet with a snack sometimes 08/27/18  Yes [provider]  calcitRIOL (ROCALTROL) 0.5 MCG capsule Take 1 capsule (0.5 mcg total) by mouth every Monday, Wednesday, and Friday with hemodialysis. Patient not taking: Reported on 10/28/2019 11/16/15   Iline Oven, MD  ciprofloxacin (CIPRO) 500 MG tablet Take 500 mg by mouth in the morning and at bedtime. 10/27/19   [provider]   Current Facility-Administered Medications  Medication Dose Route Frequency Provider Last Rate Last Admin  . cefTAZidime (FORTAZ) 200 mg/mL injection 1,500 mg  1,500 mg Intraperitoneal Once in dialysis Duanne Limerick, [facility]      . HYDROmorphone (DILAUDID) injection 1 mg  1 mg Intravenous Q3H PRN Suella Broad A, PA-C   1 mg at 10/28/19 1238  . sodium chloride flush (NS) 0.9 % injection 3 mL  3 mL Intravenous Once Wyvonnia Dusky, MD      . vancomycin (VANCOCIN) 100 mg/mL injection 2,200 mg  2,200 mg Intraperitoneal Once in dialysis Duanne Limerick, [facility]       Current Outpatient  Medications  Medication Sig Dispense Refill  . aspirin (BAYER ASPIRIN EC LOW DOSE) 81 MG EC tablet Take 81 mg by mouth as directed.    . calcitRIOL (ROCALTROL) 0.25 MCG capsule Take 0.25 mcg by mouth daily.    . cinacalcet (SENSIPAR) 90 MG tablet Take 90 mg by mouth daily.     Marland Kitchen omeprazole (PRILOSEC OTC) 20 MG tablet Take 20 mg by mouth daily as needed for heartburn.    . polyethylene glycol (MIRALAX / GLYCOLAX) 17 g packet Take 17 g by mouth daily. (Patient taking differently: Take 17 g by mouth daily as needed for mild constipation. ) 30 each 0  . potassium chloride SA (KLOR-CON) 20 MEQ tablet Take 2 tablets (40 mEq total) by mouth daily. 60 tablet 0  . sucroferric oxyhydroxide (VELPHORO) 500 MG chewable tablet Chew 1,500 mg by mouth 3 (three) times daily with meals. Taking one tablet with a snack sometimes    . calcitRIOL (ROCALTROL) 0.5 MCG capsule Take 1 capsule (0.5 mcg total) by mouth every Monday, Wednesday, and Friday with hemodialysis. (Patient not taking: Reported on 10/28/2019)    . ciprofloxacin (CIPRO) 500 MG tablet Take 500 mg by mouth in the morning and at bedtime.     Labs: Basic Metabolic Panel: Recent Labs  Lab 10/28/19 1114  NA 132*  K 2.7*  CL 86*  CO2 25  GLUCOSE 93  BUN 46*  CREATININE 12.03*  CALCIUM 9.5   Liver Function Tests: Recent Labs  Lab 10/28/19 1114  AST 16  ALT 17  ALKPHOS 162*  BILITOT 4.4*  PROT 6.4*  ALBUMIN 1.9*   Recent Labs  Lab 10/28/19 1114  LIPASE 18  CBC: Recent Labs  Lab 10/28/19 1114  WBC 9.5  HGB 12.9  HCT 38.2  MCV 96.7  PLT 254   Studies/Results: DG Chest Port 1 View  Result Date: 10/28/2019 CLINICAL DATA:  Shortness of breath. EXAM: PORTABLE CHEST 1 VIEW COMPARISON:  03/02/2019 FINDINGS: Vague densities at both lung bases and new from the previous examination. The upper lungs are clear. Negative for a pneumothorax. Again noted is a vascular stent in the left axilla. Heart size is within normal limits. Patient is  slightly rotated on the examination. IMPRESSION: Subtle basilar chest densities bilaterally. Findings could represent atelectasis and/or small pleural effusions. No evidence for pulmonary edema. Electronically Signed  By: Markus Daft M.D.   On: 10/28/2019 12:09   Dialysis Orders:  CCPD. Dr. Joelyn Oms is her outpatient nephrologist --> see below, will transition to hemo  Assessment/Plan: 1.  Sepsis, persistent peritonitis: Recent admit for Acinetobacter peritonitis - s/p IV mero x 7d, d/c with plan for 7d Ceftazidime. Within few days of finishing antibiotics, she had recurrent abdominal pain and cloudy effluent. PD Cx 3/22 growing gram negative bacilli -> awaiting final, but assuming Acinetobacter again. Vanc/Ceftazidime already given today - consider getting ID involved, may need meropenem again. Will order abd/pelvic CT to r/o abscess. Given persistent symptoms, our recommendation is to have her PD cath removed, rest her abdomen, and transition to HD. Discussed with patient today and she is agreeable. 2.  ESRD: No further PD - resume HD tomorrow morning. Her LUE AVF appears useable. Will consult general surgery to remove PD cath. 3. Hypokalemia: Has not been taking PO KCl at home. IV KCl 7mEq ordered for today. 4.  Hypertension/volume: BP stable - no edema.  5.  Anemia: Hgb 12.9 - no ESA needed. 6.  Metabolic bone disease: Ca ok, Phos pending. Continue home binders. 7.  Nutrition: Alb 1.9 - add pro-stat supps once eating.   Veneta Penton, PA-C 10/28/2019, 2:57 PM  [employer]

## 2019-10-28 NOTE — ED Notes (Signed)
PO contrast completed.

## 2019-10-29 ENCOUNTER — Other Ambulatory Visit: Payer: Self-pay

## 2019-10-29 ENCOUNTER — Encounter (HOSPITAL_COMMUNITY): Payer: Self-pay | Admitting: Student in an Organized Health Care Education/Training Program

## 2019-10-29 DIAGNOSIS — D631 Anemia in chronic kidney disease: Secondary | ICD-10-CM | POA: Diagnosis not present

## 2019-10-29 DIAGNOSIS — N186 End stage renal disease: Secondary | ICD-10-CM | POA: Diagnosis not present

## 2019-10-29 DIAGNOSIS — K652 Spontaneous bacterial peritonitis: Secondary | ICD-10-CM

## 2019-10-29 DIAGNOSIS — F17210 Nicotine dependence, cigarettes, uncomplicated: Secondary | ICD-10-CM

## 2019-10-29 DIAGNOSIS — Z880 Allergy status to penicillin: Secondary | ICD-10-CM

## 2019-10-29 DIAGNOSIS — Z992 Dependence on renal dialysis: Secondary | ICD-10-CM | POA: Diagnosis not present

## 2019-10-29 DIAGNOSIS — K659 Peritonitis, unspecified: Secondary | ICD-10-CM | POA: Diagnosis not present

## 2019-10-29 DIAGNOSIS — K650 Generalized (acute) peritonitis: Secondary | ICD-10-CM | POA: Diagnosis not present

## 2019-10-29 DIAGNOSIS — Z79899 Other long term (current) drug therapy: Secondary | ICD-10-CM | POA: Diagnosis not present

## 2019-10-29 DIAGNOSIS — D509 Iron deficiency anemia, unspecified: Secondary | ICD-10-CM

## 2019-10-29 DIAGNOSIS — B9689 Other specified bacterial agents as the cause of diseases classified elsewhere: Secondary | ICD-10-CM

## 2019-10-29 DIAGNOSIS — I120 Hypertensive chronic kidney disease with stage 5 chronic kidney disease or end stage renal disease: Secondary | ICD-10-CM

## 2019-10-29 LAB — CBC
HCT: 35.5 % — ABNORMAL LOW (ref 36.0–46.0)
Hemoglobin: 12.4 g/dL (ref 12.0–15.0)
MCH: 32.7 pg (ref 26.0–34.0)
MCHC: 34.9 g/dL (ref 30.0–36.0)
MCV: 93.7 fL (ref 80.0–100.0)
Platelets: 220 10*3/uL (ref 150–400)
RBC: 3.79 MIL/uL — ABNORMAL LOW (ref 3.87–5.11)
RDW: 13.3 % (ref 11.5–15.5)
WBC: 21.4 10*3/uL — ABNORMAL HIGH (ref 4.0–10.5)
nRBC: 0 % (ref 0.0–0.2)

## 2019-10-29 LAB — SURGICAL PCR SCREEN
MRSA, PCR: NEGATIVE
Staphylococcus aureus: NEGATIVE

## 2019-10-29 LAB — GLUCOSE, CAPILLARY
Glucose-Capillary: 77 mg/dL (ref 70–99)
Glucose-Capillary: 84 mg/dL (ref 70–99)

## 2019-10-29 LAB — RENAL FUNCTION PANEL
Albumin: 1.6 g/dL — ABNORMAL LOW (ref 3.5–5.0)
Anion gap: 18 — ABNORMAL HIGH (ref 5–15)
BUN: 47 mg/dL — ABNORMAL HIGH (ref 6–20)
CO2: 25 mmol/L (ref 22–32)
Calcium: 9.2 mg/dL (ref 8.9–10.3)
Chloride: 87 mmol/L — ABNORMAL LOW (ref 98–111)
Creatinine, Ser: 12.38 mg/dL — ABNORMAL HIGH (ref 0.44–1.00)
GFR calc Af Amer: 4 mL/min — ABNORMAL LOW (ref >60)
GFR calc non Af Amer: 3 mL/min — ABNORMAL LOW (ref >60)
Glucose, Bld: 56 mg/dL — ABNORMAL LOW (ref 70–99)
Phosphorus: 8.3 mg/dL — ABNORMAL HIGH (ref 2.5–4.6)
Potassium: 3.9 mmol/L (ref 3.5–5.1)
Sodium: 130 mmol/L — ABNORMAL LOW (ref 135–145)

## 2019-10-29 MED ORDER — CALCITRIOL 0.25 MCG PO CAPS
ORAL_CAPSULE | ORAL | Status: AC
  Administered 2019-10-29: 0.25 ug via ORAL
  Filled 2019-10-29: qty 1

## 2019-10-29 MED ORDER — SODIUM CHLORIDE 0.9 % IV SOLN
500.0000 mg | INTRAVENOUS | Status: DC
  Administered 2019-10-29 – 2019-10-31 (×3): 500 mg via INTRAVENOUS
  Filled 2019-10-29 (×5): qty 0.5

## 2019-10-29 MED ORDER — SUCROFERRIC OXYHYDROXIDE 500 MG PO CHEW
1500.0000 mg | CHEWABLE_TABLET | Freq: Three times a day (TID) | ORAL | Status: DC
  Administered 2019-10-29 – 2019-11-01 (×6): 1500 mg via ORAL
  Filled 2019-10-29 (×11): qty 3

## 2019-10-29 MED ORDER — HYDROMORPHONE HCL 2 MG PO TABS
2.0000 mg | ORAL_TABLET | ORAL | Status: DC | PRN
  Administered 2019-10-29 (×2): 2 mg via ORAL
  Filled 2019-10-29 (×2): qty 1

## 2019-10-29 NOTE — Plan of Care (Signed)
  Problem: Clinical Measurements: Goal: Diagnostic test results will improve Outcome: Progressing   

## 2019-10-29 NOTE — Progress Notes (Signed)
   Vital Signs MEWS/VS Documentation      10/29/2019 1503 10/29/2019 1900 10/29/2019 2022 10/29/2019 2114   MEWS Score:  0  2  0  2   MEWS Score Color:  Green  Yellow  Green  Yellow   Resp:  16  -  -  16   Pulse:  97  -  -  (!) 111   BP:  (!) 138/99  -  -  124/83   Temp:  98.7 F (37.1 C)  -  -  99.2 F (37.3 C)   O2 Device:  Room Air  -  -  Room Air   Level of Consciousness:  Alert  -  Alert  -     Patient has been tachycardic since admission.  MD aware.  Patient being closely monitored.  Rated abdominal pain as 6/10.  PO Dilaudid 2 mg given at 2203.  Will continue to monitor patient.  No obvious distress.  Earleen Reaper RN    Linus Galas 10/29/2019,11:19 PM

## 2019-10-29 NOTE — Progress Notes (Signed)
New Admission Note:   Arrival Method:   Via stretcher from ED  Mental Orientation:  A & O x 4 Telemetry: NSR - Tele Box [id number] Assessment: Completed Skin:  See Assessment IV:  LUA AVF++ Pain: C/O RLQ abd pain - PD Cath assessed. Tubes:  RLQ PD Catheter - Dsg CDI Safety Measures: Safety Fall Prevention Plan has been given, discussed and signed Admission: Completed 5MW Orientation: Patient has been orientated to the room, unit and staff.  Family:  None at bedside  Patient has cell phone, eye glasses, home laptop computer, and upper dentures at bedside.  Patient was advised of [facility]'s Valuables policy.  She refused offer of safe.    Orders have been reviewed and implemented. Will continue to monitor the patient. Call light has been placed within reach and bed alarm has been activated.   Earleen Reaper RN- BC, [facility] Phone number: [phone]

## 2019-10-29 NOTE — Progress Notes (Signed)
Patient ID: Ruth Gutierrez, female   DOB: 12/17/1965, 54 y.o.   MRN: [id number]       Subjective: Patient going to HD soon.  States her abdominal is improved somewhat today from yesterday, but still present.    ROS: See above, otherwise other systems negative  Objective: Vital signs in last 24 hours: Temp:  [97.6 F (36.4 C)-98.5 F (36.9 C)] 98.5 F (36.9 C) (03/27 0847) Pulse Rate:  [100-112] 111 (03/27 0847) Resp:  [9-26] 20 (03/27 0847) BP: (95-191)/(69-108) 128/81 (03/27 0847) SpO2:  [91 %-99 %] 97 % (03/27 0847) Weight:  [73.5 kg-75.9 kg] 75.9 kg (03/26 2250) Last BM Date: 10/28/19  Intake/Output from previous day: 03/26 0701 - 03/27 0700 In: 640 [P.O.:240; IV Piggyback:400] Out: 0  Intake/Output this shift: No intake/output data recorded.  PE: Heart: regular Lungs: CTAB Abd: soft, but tender, somewhat diffusely, PD cath in place, +BS  Lab Results:  Recent Labs    10/28/19 1114 10/29/19 0411  WBC 9.5 21.4*  HGB 12.9 12.4  HCT 38.2 35.5*  PLT 254 220   BMET Recent Labs    10/28/19 1114 10/29/19 0411  NA 132* 130*  K 2.7* 3.9  CL 86* 87*  CO2 25 25  GLUCOSE 93 56*  BUN 46* 47*  CREATININE 12.03* 12.38*  CALCIUM 9.5 9.2   PT/INR No results for input(s): LABPROT, INR in the last 72 hours. CMP     Component Value Date/Time   NA 130 (L) 10/29/2019 0411   K 3.9 10/29/2019 0411   CL 87 (L) 10/29/2019 0411   CO2 25 10/29/2019 0411   GLUCOSE 56 (L) 10/29/2019 0411   BUN 47 (H) 10/29/2019 0411   CREATININE 12.38 (H) 10/29/2019 0411   CREATININE 1.47 (H) 04/14/2013 1149   CALCIUM 9.2 10/29/2019 0411   CALCIUM 7.2 (L) 01/13/2007 0830   PROT 6.4 (L) 10/28/2019 1114   ALBUMIN 1.6 (L) 10/29/2019 0411   AST 16 10/28/2019 1114   ALT 17 10/28/2019 1114   ALKPHOS 162 (H) 10/28/2019 1114   BILITOT 4.4 (H) 10/28/2019 1114   GFRNONAA 3 (L) 10/29/2019 0411   GFRNONAA 43 (L) 04/14/2013 1149   GFRAA 4 (L) 10/29/2019 0411   GFRAA 49 (L) 04/14/2013  1149   Lipase     Component Value Date/Time   LIPASE 18 10/28/2019 1114       Studies/Results: CT ABDOMEN PELVIS WO CONTRAST  Result Date: 10/28/2019 CLINICAL DATA:  54 year old female with end-stage renal disease and peritoneal dialysis catheter. Recently hospitalized for acinetobacter peritonitis status post 2 week course of IV antibiotics. Recurrent pain and cloudy peritoneal fluid. Placed back on IV ceftazidime without improvement. Peritoneal cultures positive for gram-negative rods today. Free air on earlier CT without oral contrast. Query extravasation of oral contrast. EXAM: CT ABDOMEN AND PELVIS WITHOUT CONTRAST TECHNIQUE: Multidetector CT imaging of the abdomen and pelvis was performed following the standard protocol without IV contrast. COMPARISON:  CT Abdomen and Pelvis 1644 hours today. Abdominal radiographs 10/04/2019. FINDINGS: Lower chest: Stable lung bases with confluent multifocal lower lobe and right middle lobe opacity which could be atelectasis or pneumonia. No pericardial or pleural effusion. Hepatobiliary: Some vicarious excretion of contrast to the gallbladder. Stable liver and small volume perihepatic fluid. Pancreas: Stable. Spleen: Stable. Adrenals/Urinary Tract: Stable adrenal thickening and native renal atrophy. Decompressed urinary bladder. Stomach/Bowel: Oral contrast was administered and has reached the distal small bowel but not yet the terminal ileum. There was some residual contrast mixed with stool  in the cecum previously which appears unchanged. Trace abdominal free fluid with no definite extravasation of oral contrast. Small bowel loops remain thick-walled in the proximal jejunum. No interval dilatation of small bowel. Unchanged large bowel with mild generalized wall thickening also. The volume of free intraperitoneal air is mildly increased in the lower abdomen on series 3, image 65, but stable elsewhere (actually slightly decreased over the liver dome). There are  chronic embolization coils in the right lower quadrant (series 3, image 61). This might be related to a previous pelvic renal transplant, uncertain. Negative stomach. Vascular/Lymphatic: Aortoiliac calcified atherosclerosis. Reproductive: Stable from earlier, Multiple calcified uterine fibroids. Other: Unchanged peritoneal dialysis catheter position in the lower pelvis overlying the decompressed urinary bladder (series 3, image 81). Musculoskeletal: Renal osteodystrophy suspected. Multilevel advanced disc disease with vacuum disc. Grade 1 anterolisthesis in the lower lumbar spine with severe facet arthropathy. No acute osseous abnormality identified. IMPRESSION: 1. No extravasation of the oral contrast which has reached the distal small bowel but not yet the TI at the time of these images. Meanwhile, the overall volume of intraperitoneal air has not significantly changed from 1644 hours today. 2. Otherwise stable CT appearance of the abdomen and pelvis, including proximal small bowel and generalized large bowel wall thickening. 3. Stable lung bases with confluent right middle and bilateral lower lobe atelectasis versus pneumonia. Electronically Signed   By: Genevie Ann M.D.   On: 10/28/2019 21:28   CT ABDOMEN PELVIS W CONTRAST  Result Date: 10/28/2019 CLINICAL DATA:  Abdominal abscesses. Assess for peritonitis or perforation. EXAM: CT ABDOMEN AND PELVIS WITH CONTRAST TECHNIQUE: Multidetector CT imaging of the abdomen and pelvis was performed using the standard protocol following bolus administration of intravenous contrast. CONTRAST:  162mL OMNIPAQUE IOHEXOL 300 MG/ML  SOLN COMPARISON:  None. FINDINGS: Lower chest: Consolidation of the anterior left lower lobe is identified. Atelectasis of bilateral lung bases are noted. The heart size is normal. Hepatobiliary: No focal liver abnormality is seen. No gallstones, gallbladder wall thickening, or biliary dilatation. Pancreas: Unremarkable. No pancreatic ductal  dilatation or surrounding inflammatory changes. Spleen: Normal in size without focal abnormality. Adrenals/Urinary Tract: Bilateral adrenal glands are normal. Atrophy of bilateral kidneys are noted. The catheter is identified in the bladder. Stomach/Bowel: There is no bowel obstruction. Thick wall small bowel loops are identified in the upper abdomen. There is free air. Postsurgical changes are identified in the right lower quadrant. There is diverticulosis of colon. Vascular/Lymphatic: Aortic atherosclerosis. No enlarged abdominal or pelvic lymph nodes. Reproductive: Calcified uterine fibroids are noted. Other: Small amount of ascites is noted in the abdomen and pelvis. Musculoskeletal: Degenerative joint changes of the spine are noted. IMPRESSION: 1. Thick wall small bowel loops are identified in the upper abdomen. There is free air in the abdomen. The findings are suspicious for bowel perforation. 2. Small amount of ascites in the abdomen and pelvis. 3. Consolidation of the anterior left lower lobe, pneumonia is not excluded. Aortic Atherosclerosis (ICD10-I70.0). These results will be called to the ordering clinician or representative by the Radiologist Assistant, and communication documented in the PACS or [employer]. Electronically Signed   By: Abelardo Diesel M.D.   On: 10/28/2019 17:10   DG Chest Port 1 View  Result Date: 10/28/2019 CLINICAL DATA:  Shortness of breath. EXAM: PORTABLE CHEST 1 VIEW COMPARISON:  03/02/2019 FINDINGS: Vague densities at both lung bases and new from the previous examination. The upper lungs are clear. Negative for a pneumothorax. Again noted is a vascular stent  in the left axilla. Heart size is within normal limits. Patient is slightly rotated on the examination. IMPRESSION: Subtle basilar chest densities bilaterally. Findings could represent atelectasis and/or small pleural effusions. No evidence for pulmonary edema. Electronically Signed   By: Markus Daft M.D.   On:  10/28/2019 12:09    Anti-infectives: Anti-infectives (From admission, onward)   Start     Dose/Rate Route Frequency Ordered Stop   10/29/19 1800  meropenem (MERREM) 500 mg in sodium chloride 0.9 % 100 mL IVPB     500 mg 200 mL/hr over 30 Minutes Intravenous Every 24 hours 10/29/19 0926     10/28/19 1630  meropenem (MERREM) 1 g in sodium chloride 0.9 % 100 mL IVPB  Status:  Discontinued     1 g 200 mL/hr over 30 Minutes Intravenous Every 24 hours 10/28/19 1626 10/29/19 0926   10/28/19 1445  vancomycin (VANCOCIN) 100 mg/mL injection 2,200 mg  Status:  Discontinued     2,200 mg Intraperitoneal Once in dialysis 10/28/19 1444 10/28/19 1611   10/28/19 1445  cefTAZidime (FORTAZ) 200 mg/mL injection 1,500 mg  Status:  Discontinued     1,500 mg Intraperitoneal Once in dialysis 10/28/19 1444 10/28/19 1611       Assessment/Plan ESRD - nephrology consulted and transitioning to HD tomorrow vis LUE AVF Hypokalemia  HTN Anemia  Protein calorie malnutrition - alb 1.9  Peritonitis in the setting of daily PD with recent admission for acinetobacter peritonitis  -WBC up to 21 today from normal yesterday; however, patient is feeling better today -going to HD right now -will discuss with MD timing of PD cath removal.   -CT scan last night did not show underlying bowel pathology as source of peritonitis.  FEN - NPO, may be able to eat today unless plans for surgery, but unlikely today VTE - heparin ID - Merrem   LOS: 1 day    Henreitta Cea , [facility] Surgery 10/29/2019, 10:45 AM Please see Amion for pager number during day hours 7:00am-4:30pm or 7:00am -11:30am on weekends

## 2019-10-29 NOTE — Progress Notes (Signed)
   Subjective:   She is having increased abdominal pain this morning. Denies nausea or recent difficulty with bowel movements. She denies shortness of breath.   Objective:  Vital signs in last 24 hours: Vitals:   10/28/19 2215 10/28/19 2250 10/29/19 0202 10/29/19 0536  BP: 118/76 126/85 116/77 122/75  Pulse:  (!) 102 (!) 108 100  Resp: 12 18    Temp:  97.6 F (36.4 C) 97.9 F (36.6 C) 98 F (36.7 C)  TempSrc:  Oral Oral Oral  SpO2: 96% 97% 94% 95%  Weight:  75.9 kg    Height:  5\' 7"  (1.702 m)     Constitution: NAD, appears stated age Cardio: sinus tachycardia, no m/r/g, no LE edema  Respiratory: CTA, no w/r/r Abdominal: diffusely TTP, soft, non-distended, +BS Neuro: normal affect, a&ox3 Skin: c/d/i   Assessment/Plan:  Principal Problem:   Peritonitis (HCC) Active Problems:   Hypokalemia  54yo female with PMH ESRD on PD with recent admission for Acinetobacter peritonitis s/p IV merrem for 7 days, discharged with ceftazidime with recurrence of abdominal pain and cloudy diasylate a few days after finishing antibiotics.   Recurrent Bacterial Peritonitis ESRD on PD Body fluid culture yesterday without organisms thus far. Vanc/ceftazidime given. Infectious disease consulted as well and transitioned to [facility].   - nephrology recommending transition to HD with recurrence of peritonitis, planning for HD today and starting CLIP process - dilaudid prn abdominal pain  - cont. merrem day 1, received vanc/ceftaz yesterday  - PD catheter to be removed later this week per surgery  - dilaudid 2mg  q4h po prn abdominal pain  Bowel Wall Thickening and Free Air CT with po contrast done for concern of perforation on abdominal CT with bowel wall thickening and free air. No extravasation noted and small amount of free air stable. Bowel wall thickening thought to be secondary to PD per surgery.  VTE: heparin IVF: none Diet: renal Code: full  Dispo: Anticipated discharge pending  clinical imrovement.   Molli Hazard A, DO 10/29/2019, 7:26 AM Pager: [phone]

## 2019-10-29 NOTE — Progress Notes (Addendum)
Pharmacy Antibiotic Note  Ruth Gutierrez is a 54 y.o. female admitted on 10/28/2019 with Peritonitis.   Pt was started on vanc and ceftazidime before optimizing to meropenem after ID suggestion. PD cath is scheduled to be removed and getting HD instead.    Height: 5\' 7"  (170.2 cm) Weight: 167 lb 5.3 oz (75.9 kg) IBW/kg (Calculated) : 61.6  Temp (24hrs), Avg:98 F (36.7 C), Min:97.6 F (36.4 C), Max:98.5 F (36.9 C)  Recent Labs  Lab 10/28/19 1114 10/28/19 1235 10/29/19 0411  WBC 9.5  --  21.4*  CREATININE 12.03*  --  12.38*  LATICACIDVEN  --  1.1  --     Estimated Creatinine Clearance: 5.6 mL/min (A) (by C-G formula based on SCr of 12.38 mg/dL (H)).    Allergies  Allergen Reactions  . Penicillin G Itching and Rash  . Penicillins Itching and Rash    Did it involve swelling of the face/tongue/throat, SOB, or low BP?Y Did it involve sudden or severe rash/hives, skin peeling, or any reaction on the inside of your mouth or nose? Y Did you need to seek medical attention at a hospital or doctor's office? Y When did it last happen?2016 If all above answers are "NO", may proceed with cephalosporin use.    Antimicrobials this admission: 3/26 Ceftazidime x1 3/26 Vancomycin x1 3/27 Merrem>>   Dose adjustments this admission: N/a  Microbiology results: 3/27 MRSA PCR neg 3/26 COVID neg 3/26 peritoneal>> Outpx peritoneal>>GNR   Plan:  Change Merrem to 500mg  IV q24 F/u with culture  Onnie Boer, PharmD, BCIDP, AAHIVP, CPP Infectious Disease Pharmacist 10/29/2019 9:33 AM

## 2019-10-29 NOTE — Progress Notes (Signed)
[facility] Progress Note   Dialysis Orders:  CCPD. Dr. Joelyn Oms is her outpatient nephrologist --> see below, will transition to hemo  Assessment/Plan: 1.  Sepsis, persistent peritonitis: Recent admit for Acinetobacter peritonitis - s/p IV mero x 7d, d/c with plan for 7d Ceftazidime. Within few days of finishing antibiotics, she had recurrent abdominal pain and cloudy effluent. PD Cx 3/22 growing gram negative bacilli -> awaiting final, but assuming Acinetobacter again. Vanc/Ceftazidime already given today -ID involved. meropenem approved. Abd/pelvic CT no acute findings; does have some prox small bowel and generalized large bowel wall thickening; air stable related to PD. Given persistent symptoms, our recommendation is to have her PD cath removed, rest her abdomen, and transition to HD. Discussed with patient and she is agreeable.  Would like to transition back to her previous HD unit, [place]. Will ask SW to arrange on Monday. 2.  ESRD: No further PD - resume HD today with functional AVF. Has been sen by [facility] for PD cath removal. Plans pending 3. Hypokalemia: Has not been taking PO KCl at home. Corrected with  IV K - 3.9 today 4.  Hypertension/volume: BP stable - no edema.  5.  Anemia: Hgb 12.4 - no ESA needed. 6.  Metabolic bone disease: Ca ok, P 8.3  Continue home binders. velphoro 3 ac  7.   Nutrition: Alb 1.9 - add pro-stat supps once eating.- NPO - can advance to renal diet if surgery to be next week.  Myriam Jacobson, PA-C [facility] Beeper [phone] 10/29/2019,9:31 AM  LOS: 1 day   Subjective:   Understands plan of care and that surgical removal of PD cath likely to be deferred until next week   Objective Vitals:   10/28/19 2250 10/29/19 0202 10/29/19 0536 10/29/19 0847  BP: 126/85 116/77 122/75 128/81  Pulse: (!) 102 (!) 108 100 (!) 111  Resp: 18   20  Temp: 97.6 F (36.4 C) 97.9 F (36.6 C) 98 F (36.7 C) 98.5 F (36.9 C)  TempSrc: Oral Oral  Oral Oral  SpO2: 97% 94% 95% 97%  Weight: 75.9 kg     Height: 5\' 7"  (1.702 m)      Physical Exam General: NAD breathing easily on room air Heart: tachy reg  Lungs: no rales Abdomen: soft mild tenderness Extremities: no LE edema Dialysis Access: left upper AVF + bruit/PD cath   Additional Objective Labs: Basic Metabolic Panel: Recent Labs  Lab 10/28/19 1114 10/29/19 0411  NA 132* 130*  K 2.7* 3.9  CL 86* 87*  CO2 25 25  GLUCOSE 93 56*  BUN 46* 47*  CREATININE 12.03* 12.38*  CALCIUM 9.5 9.2  PHOS  --  8.3*   Liver Function Tests: Recent Labs  Lab 10/28/19 1114 10/29/19 0411  AST 16  --   ALT 17  --   ALKPHOS 162*  --   BILITOT 4.4*  --   PROT 6.4*  --   ALBUMIN 1.9* 1.6*   Recent Labs  Lab 10/28/19 1114  LIPASE 18   CBC: Recent Labs  Lab 10/28/19 1114 10/29/19 0411  WBC 9.5 21.4*  HGB 12.9 12.4  HCT 38.2 35.5*  MCV 96.7 93.7  PLT 254 220   Blood Culture    Component Value Date/Time   SDES FLUID PERITONEAL DIALYSATE 10/28/2019 1550   SPECREQUEST NONE 10/28/2019 1550   CULT PENDING 10/28/2019 1550   REPTSTATUS PENDING 10/28/2019 1550    Cardiac Enzymes: No results for input(s): CKTOTAL, CKMB, CKMBINDEX, TROPONINI  in the last 168 hours. CBG: No results for input(s): GLUCAP in the last 168 hours. Iron Studies: No results for input(s): IRON, TIBC, TRANSFERRIN, FERRITIN in the last 72 hours. Lab Results  Component Value Date   INR 1.35 03/09/2011   Studies/Results: CT ABDOMEN PELVIS WO CONTRAST  Result Date: 10/28/2019 CLINICAL DATA:  54 year old female with end-stage renal disease and peritoneal dialysis catheter. Recently hospitalized for acinetobacter peritonitis status post 2 week course of IV antibiotics. Recurrent pain and cloudy peritoneal fluid. Placed back on IV ceftazidime without improvement. Peritoneal cultures positive for gram-negative rods today. Free air on earlier CT without oral contrast. Query extravasation of oral contrast.  EXAM: CT ABDOMEN AND PELVIS WITHOUT CONTRAST TECHNIQUE: Multidetector CT imaging of the abdomen and pelvis was performed following the standard protocol without IV contrast. COMPARISON:  CT Abdomen and Pelvis 1644 hours today. Abdominal radiographs 10/04/2019. FINDINGS: Lower chest: Stable lung bases with confluent multifocal lower lobe and right middle lobe opacity which could be atelectasis or pneumonia. No pericardial or pleural effusion. Hepatobiliary: Some vicarious excretion of contrast to the gallbladder. Stable liver and small volume perihepatic fluid. Pancreas: Stable. Spleen: Stable. Adrenals/Urinary Tract: Stable adrenal thickening and native renal atrophy. Decompressed urinary bladder. Stomach/Bowel: Oral contrast was administered and has reached the distal small bowel but not yet the terminal ileum. There was some residual contrast mixed with stool in the cecum previously which appears unchanged. Trace abdominal free fluid with no definite extravasation of oral contrast. Small bowel loops remain thick-walled in the proximal jejunum. No interval dilatation of small bowel. Unchanged large bowel with mild generalized wall thickening also. The volume of free intraperitoneal air is mildly increased in the lower abdomen on series 3, image 65, but stable elsewhere (actually slightly decreased over the liver dome). There are chronic embolization coils in the right lower quadrant (series 3, image 61). This might be related to a previous pelvic renal transplant, uncertain. Negative stomach. Vascular/Lymphatic: Aortoiliac calcified atherosclerosis. Reproductive: Stable from earlier, Multiple calcified uterine fibroids. Other: Unchanged peritoneal dialysis catheter position in the lower pelvis overlying the decompressed urinary bladder (series 3, image 81). Musculoskeletal: Renal osteodystrophy suspected. Multilevel advanced disc disease with vacuum disc. Grade 1 anterolisthesis in the lower lumbar spine with  severe facet arthropathy. No acute osseous abnormality identified. IMPRESSION: 1. No extravasation of the oral contrast which has reached the distal small bowel but not yet the TI at the time of these images. Meanwhile, the overall volume of intraperitoneal air has not significantly changed from 1644 hours today. 2. Otherwise stable CT appearance of the abdomen and pelvis, including proximal small bowel and generalized large bowel wall thickening. 3. Stable lung bases with confluent right middle and bilateral lower lobe atelectasis versus pneumonia. Electronically Signed   By: Genevie Ann M.D.   On: 10/28/2019 21:28   CT ABDOMEN PELVIS W CONTRAST  Result Date: 10/28/2019 CLINICAL DATA:  Abdominal abscesses. Assess for peritonitis or perforation. EXAM: CT ABDOMEN AND PELVIS WITH CONTRAST TECHNIQUE: Multidetector CT imaging of the abdomen and pelvis was performed using the standard protocol following bolus administration of intravenous contrast. CONTRAST:  121mL OMNIPAQUE IOHEXOL 300 MG/ML  SOLN COMPARISON:  None. FINDINGS: Lower chest: Consolidation of the anterior left lower lobe is identified. Atelectasis of bilateral lung bases are noted. The heart size is normal. Hepatobiliary: No focal liver abnormality is seen. No gallstones, gallbladder wall thickening, or biliary dilatation. Pancreas: Unremarkable. No pancreatic ductal dilatation or surrounding inflammatory changes. Spleen: Normal in size without focal abnormality.  Adrenals/Urinary Tract: Bilateral adrenal glands are normal. Atrophy of bilateral kidneys are noted. The catheter is identified in the bladder. Stomach/Bowel: There is no bowel obstruction. Thick wall small bowel loops are identified in the upper abdomen. There is free air. Postsurgical changes are identified in the right lower quadrant. There is diverticulosis of colon. Vascular/Lymphatic: Aortic atherosclerosis. No enlarged abdominal or pelvic lymph nodes. Reproductive: Calcified uterine fibroids  are noted. Other: Small amount of ascites is noted in the abdomen and pelvis. Musculoskeletal: Degenerative joint changes of the spine are noted. IMPRESSION: 1. Thick wall small bowel loops are identified in the upper abdomen. There is free air in the abdomen. The findings are suspicious for bowel perforation. 2. Small amount of ascites in the abdomen and pelvis. 3. Consolidation of the anterior left lower lobe, pneumonia is not excluded. Aortic Atherosclerosis (ICD10-I70.0). These results will be called to the ordering clinician or representative by the Radiologist Assistant, and communication documented in the PACS or [employer]. Electronically Signed   By: Abelardo Diesel M.D.   On: 10/28/2019 17:10   DG Chest Port 1 View  Result Date: 10/28/2019 CLINICAL DATA:  Shortness of breath. EXAM: PORTABLE CHEST 1 VIEW COMPARISON:  03/02/2019 FINDINGS: Vague densities at both lung bases and new from the previous examination. The upper lungs are clear. Negative for a pneumothorax. Again noted is a vascular stent in the left axilla. Heart size is within normal limits. Patient is slightly rotated on the examination. IMPRESSION: Subtle basilar chest densities bilaterally. Findings could represent atelectasis and/or small pleural effusions. No evidence for pulmonary edema. Electronically Signed   By: Markus Daft M.D.   On: 10/28/2019 12:09   Medications: . meropenem (MERREM) IV     . aspirin EC  81 mg Oral Daily  . calcitRIOL  0.25 mcg Oral Daily  . Chlorhexidine Gluconate Cloth  6 each Topical Q0600  . cinacalcet  90 mg Oral Daily  . feeding supplement (PRO-STAT SUGAR FREE 64)  30 mL Oral BID  . heparin  5,000 Units Subcutaneous Q8H  . ondansetron (ZOFRAN) IV  4 mg Intravenous Once  . sodium chloride flush  3 mL Intravenous Once

## 2019-10-30 DIAGNOSIS — N186 End stage renal disease: Secondary | ICD-10-CM | POA: Diagnosis not present

## 2019-10-30 DIAGNOSIS — K650 Generalized (acute) peritonitis: Secondary | ICD-10-CM | POA: Diagnosis not present

## 2019-10-30 DIAGNOSIS — D631 Anemia in chronic kidney disease: Secondary | ICD-10-CM | POA: Diagnosis not present

## 2019-10-30 DIAGNOSIS — K659 Peritonitis, unspecified: Secondary | ICD-10-CM | POA: Diagnosis not present

## 2019-10-30 DIAGNOSIS — Z79899 Other long term (current) drug therapy: Secondary | ICD-10-CM | POA: Diagnosis not present

## 2019-10-30 DIAGNOSIS — Z992 Dependence on renal dialysis: Secondary | ICD-10-CM | POA: Diagnosis not present

## 2019-10-30 LAB — RENAL FUNCTION PANEL
Albumin: 1.6 g/dL — ABNORMAL LOW (ref 3.5–5.0)
Anion gap: 13 (ref 5–15)
BUN: 22 mg/dL — ABNORMAL HIGH (ref 6–20)
CO2: 27 mmol/L (ref 22–32)
Calcium: 8.9 mg/dL (ref 8.9–10.3)
Chloride: 94 mmol/L — ABNORMAL LOW (ref 98–111)
Creatinine, Ser: 7.56 mg/dL — ABNORMAL HIGH (ref 0.44–1.00)
GFR calc Af Amer: 7 mL/min — ABNORMAL LOW (ref >60)
GFR calc non Af Amer: 6 mL/min — ABNORMAL LOW (ref >60)
Glucose, Bld: 78 mg/dL (ref 70–99)
Phosphorus: 3.7 mg/dL (ref 2.5–4.6)
Potassium: 3.4 mmol/L — ABNORMAL LOW (ref 3.5–5.1)
Sodium: 134 mmol/L — ABNORMAL LOW (ref 135–145)

## 2019-10-30 LAB — CBC
HCT: 33.3 % — ABNORMAL LOW (ref 36.0–46.0)
Hemoglobin: 11.1 g/dL — ABNORMAL LOW (ref 12.0–15.0)
MCH: 32.1 pg (ref 26.0–34.0)
MCHC: 33.3 g/dL (ref 30.0–36.0)
MCV: 96.2 fL (ref 80.0–100.0)
Platelets: 221 10*3/uL (ref 150–400)
RBC: 3.46 MIL/uL — ABNORMAL LOW (ref 3.87–5.11)
RDW: 13.7 % (ref 11.5–15.5)
WBC: 21.6 10*3/uL — ABNORMAL HIGH (ref 4.0–10.5)
nRBC: 0 % (ref 0.0–0.2)

## 2019-10-30 LAB — GLUCOSE, CAPILLARY
Glucose-Capillary: 65 mg/dL — ABNORMAL LOW (ref 70–99)
Glucose-Capillary: 75 mg/dL (ref 70–99)

## 2019-10-30 MED ORDER — NEPRO/CARBSTEADY PO LIQD
237.0000 mL | Freq: Two times a day (BID) | ORAL | Status: DC
  Administered 2019-10-31 (×3): 237 mL via ORAL

## 2019-10-30 MED ORDER — RENA-VITE PO TABS
1.0000 | ORAL_TABLET | Freq: Every day | ORAL | Status: DC
  Administered 2019-10-30 – 2019-10-31 (×2): 1 via ORAL
  Filled 2019-10-30 (×2): qty 1

## 2019-10-30 MED ORDER — HYDROMORPHONE HCL 1 MG/ML IJ SOLN
1.0000 mg | INTRAMUSCULAR | Status: DC | PRN
  Administered 2019-10-30 – 2019-11-01 (×7): 1 mg via INTRAVENOUS
  Filled 2019-10-30 (×7): qty 1

## 2019-10-30 MED ORDER — ASPIRIN EC 81 MG PO TBEC
81.0000 mg | DELAYED_RELEASE_TABLET | Freq: Every day | ORAL | Status: DC
  Filled 2019-10-30: qty 1

## 2019-10-30 NOTE — Anesthesia Preprocedure Evaluation (Addendum)
Anesthesia Evaluation  Patient identified by MRN, date of birth, ID band Patient awake    Reviewed: Allergy & Precautions, NPO status , Patient's Chart, lab work & pertinent test results  History of Anesthesia Complications Negative for: history of anesthetic complications  Airway Mallampati: II  TM Distance: >3 FB Neck ROM: Full    Dental  (+) Edentulous Upper, Dental Advisory Given   Pulmonary COPD, Current Smoker,    Pulmonary exam normal        Cardiovascular hypertension, (-) anginaNormal cardiovascular exam  '08 ECHO: EF 50-55%, valves OK   Neuro/Psych  Headaches,    GI/Hepatic Neg liver ROS, GERD  Controlled,  Endo/Other  negative endocrine ROS  Renal/GU CRFRenal disease (K+ 3.4)S/p renal transplant 2010     Musculoskeletal  (+) Arthritis , Osteoarthritis,    Abdominal   Peds  Hematology negative hematology ROS (+)   Anesthesia Other Findings   Reproductive/Obstetrics LMP 12/14/14                            Anesthesia Physical  Anesthesia Plan  ASA: III  Anesthesia Plan: General   Post-op Pain Management:    Induction: Intravenous  PONV Risk Score and Plan: 3 and Ondansetron, Dexamethasone and Midazolam  Airway Management Planned: LMA  Additional Equipment:   Intra-op Plan:   Post-operative Plan: Extubation in OR  Informed Consent: I have reviewed the patients History and Physical, chart, labs and discussed the procedure including the risks, benefits and alternatives for the proposed anesthesia with the patient or authorized representative who has indicated his/her understanding and acceptance.     Dental advisory given  Plan Discussed with: Anesthesiologist and CRNA  Anesthesia Plan Comments:        Anesthesia Quick Evaluation

## 2019-10-30 NOTE — Progress Notes (Signed)
Patient ID: Ruth Gutierrez, female   DOB: [birth year], 54 y.o.   MRN: [id number]   Acute Care Surgery Service Progress Note:    Chief Complaint/Subjective: Feels ok   Objective: Vital signs in last 24 hours: Temp:  [98.4 F (36.9 C)-99.2 F (37.3 C)] 98.4 F (36.9 C) (03/28 0541) Pulse Rate:  [60-113] 106 (03/28 0541) Resp:  [14-16] 16 (03/28 0541) BP: (114-151)/(83-99) 134/92 (03/28 0541) SpO2:  [95 %-98 %] 95 % (03/28 0541) Weight:  [71.8 kg-74.8 kg] 71.8 kg (03/27 1503) Last BM Date: 10/28/19  Intake/Output from previous day: 03/27 0701 - 03/28 0700 In: 360 [P.O.:360] Out: 2000  Intake/Output this shift: No intake/output data recorded.  Lungs: cta, nonlabored  Cardiovascular: reg  Abd: soft, mild TTP, pd cath ok.   Extremities: no edema, +SCDs; LUE thrill  Neuro: alert, nonfocal  Lab Results: CBC  Recent Labs    10/29/19 0411 10/30/19 0432  WBC 21.4* 21.6*  HGB 12.4 11.1*  HCT 35.5* 33.3*  PLT 220 221   BMET Recent Labs    10/29/19 0411 10/30/19 0432  NA 130* 134*  K 3.9 3.4*  CL 87* 94*  CO2 25 27  GLUCOSE 56* 78  BUN 47* 22*  CREATININE 12.38* 7.56*  CALCIUM 9.2 8.9   LFT Hepatic Function Latest Ref Rng & Units 10/30/2019 10/29/2019 10/28/2019  Total Protein 6.5 - 8.1 g/dL - - 6.4(L)  Albumin 3.5 - 5.0 g/dL 1.6(L) 1.6(L) 1.9(L)  AST 15 - 41 U/L - - 16  ALT 0 - 44 U/L - - 17  Alk Phosphatase 38 - 126 U/L - - 162(H)  Total Bilirubin 0.3 - 1.2 mg/dL - - 4.4(H)  Bilirubin, Direct 0.0 - 0.3 mg/dL - - -   PT/INR No results for input(s): LABPROT, INR in the last 72 hours. ABG No results for input(s): PHART, HCO3 in the last 72 hours.  Invalid input(s): PCO2, PO2  Studies/Results:  Anti-infectives: Anti-infectives (From admission, onward)   Start     Dose/Rate Route Frequency Ordered Stop   10/29/19 1800  meropenem (MERREM) 500 mg in sodium chloride 0.9 % 100 mL IVPB     500 mg 200 mL/hr over 30 Minutes Intravenous Every 24 hours  10/29/19 0926     10/28/19 1630  meropenem (MERREM) 1 g in sodium chloride 0.9 % 100 mL IVPB  Status:  Discontinued     1 g 200 mL/hr over 30 Minutes Intravenous Every 24 hours 10/28/19 1626 10/29/19 0926   10/28/19 1445  vancomycin (VANCOCIN) 100 mg/mL injection 2,200 mg  Status:  Discontinued     2,200 mg Intraperitoneal Once in dialysis 10/28/19 1444 10/28/19 1611   10/28/19 1445  cefTAZidime (FORTAZ) 200 mg/mL injection 1,500 mg  Status:  Discontinued     1,500 mg Intraperitoneal Once in dialysis 10/28/19 1444 10/28/19 1611      Medications: Scheduled Meds: . aspirin EC  81 mg Oral Daily  . calcitRIOL  0.25 mcg Oral Daily  . Chlorhexidine Gluconate Cloth  6 each Topical Q0600  . cinacalcet  90 mg Oral Daily  . feeding supplement (NEPRO CARB STEADY)  237 mL Oral BID BM  . feeding supplement (PRO-STAT SUGAR FREE 64)  30 mL Oral BID  . heparin  5,000 Units Subcutaneous Q8H  . multivitamin  1 tablet Oral QHS  . ondansetron (ZOFRAN) IV  4 mg Intravenous Once  . sodium chloride flush  3 mL Intravenous Once  . sucroferric oxyhydroxide  1,500 mg Oral TID  WC   Continuous Infusions: . meropenem (MERREM) IV 500 mg (10/29/19 1738)   PRN Meds:.HYDROmorphone (DILAUDID) injection  Assessment/Plan: Patient Active Problem List   Diagnosis Date Noted  . Hypokalemia 10/28/2019  . Infection due to acinetobacter baumannii   . Peritonitis ([place]) 10/04/2019  . PD catheter dysfunction ([place])   . Renal dialysis device, implant, or graft complication 68/86/4847  . ESRD on peritoneal dialysis ([place])   . HCAP (healthcare-associated pneumonia)   . Sepsis ([place]) 11/12/2015  . Pneumonia 11/12/2015  . FUO (fever of unknown origin) 05/17/2015  . Acute renal failure ([place]) 04/05/2013  . Diarrhea 04/05/2013  . Acute pyelonephritis 02/02/2013  . Viral gastroenteritis 02/02/2013  . Sepsis due to Klebsiella pneumoniae ([place]) 06/02/2011  . Fatigue 06/02/2011  . Tobacco abuse 06/02/2011  . Iron deficiency  anemia 04/04/2011  . Hypertension 04/04/2011  . S/P kidney transplant 04/04/2011  . Clotted renal dialysis AV graft ([place]) 04/04/2011  . Fibroids 04/04/2011  . Preventative health care 04/04/2011   ESRD - nephrology consulted and transitioning to HD tomorrow vis LUE AVF Hypokalemia  HTN Anemia Protein calorie malnutrition - alb 1.9  Peritonitis in the setting of daily PD with recent admission for acinetobacter peritonitis  -WBC stable 21 today from admission; however, patient is feeling ok today -going to HD right now  -CT scan fri night did not show underlying bowel pathology as source of peritonitis.  FEN - NPO p MN today for PD catheter, may be able to eat today VTE - heparin ID - Merrem Disposition: plan OR tomorrow with Dr Ninfa Linden for pd cath removal, npo p mn except meds  LOS: 2 days    Leighton Ruff. Redmond Pulling, MD, FACS General, Bariatric, & Minimally Invasive Surgery [phone] [facility]

## 2019-10-30 NOTE — Progress Notes (Signed)
Internal Medicine Teaching Service Attending:   I saw and examined the patient. I reviewed the resident's note and I agree with the resident's findings and plan as documented in the resident's note.  Principal Problem:   Peritonitis ([place]) Active Problems:   ESRD on peritoneal dialysis [facility])   Hypokalemia  Hospital day #3 for this 54 year old person living with end-stage renal disease, was on peritoneal dialysis but this became complicated by recurrent episodes of Serratia peritonitis.  She has been transitioned to hemodialysis through her left upper extremity AV fistula and tolerated her first treatment well yesterday.  We will plan for surgical removal of the PD catheter tomorrow.  She will need to complete the referral process for placement in outpatient hemodialysis unit.  We are treating peritonitis with IV meropenem, today is day #3.  Depending on how long the referral to outpatient dialysis takes, she may be able to complete 7 days of IV meropenem during this hospitalization.    Lalla Brothers, MD FACP

## 2019-10-30 NOTE — Progress Notes (Signed)
   10/30/19 0623  Pain Assessment  Pain Scale 0-10  Pain Score 7  Pain Type Acute pain  Pain Location Abdomen  Pain Orientation Right;Lower;Left  Pain Descriptors / Indicators Aching  Pain Frequency Constant  Pain Onset On-going  Patients Stated Pain Goal 0  Pain Intervention(s) MD notified (Comment);Emotional support  Provider Notification  Provider Name/Title Dr. Marva Panda  Date Provider Notified 10/30/19  Time Provider Notified [phone]  Notification Type Page  Notification Reason Requested by patient/family (Wants IV Dilaudid)  Response No new orders (Will defer to day shift team)  Date of Provider Response 10/30/19  Time of Provider Response [phone]   Patient rating abdominal pain as 7/10.  She states that PO Dilaudid is not helping.  She asks that I call the doctor and get it changed to IV.  Per Dr. Marva Panda, she will defer to day shift.  I made patient aware.  She does not want to try the PO Dilaudid at this time.  She states it make her sick and does not want nausea medicine at this time.  Will continue to monitor patient.  Earleen Reaper RN

## 2019-10-30 NOTE — Progress Notes (Addendum)
[facility] Progress Note   Dialysis Orders:  CCPD. Dr. Joelyn Oms is her outpatient nephrologist --> see below, will transition to hemo  Assessment/Plan: 1. Sepsis, persistent peritonitis: Recent admit for Acinetobacter peritonitis - s/p IV mero x 7d, d/c with plan for 7d Ceftazidime. Within few days of finishing antibiotics, she had recurrent abdominal pain and cloudy effluent. PD Cx 3/22 growing gram negative bacilli -> awaiting final, but assuming Acinetobacter again. Vanc/Ceftazidime already given today -ID involved. meropenem approved. Abd/pelvic CT no acute findings; does have some prox small bowel and generalized large bowel wall thickening; air stable related to PD. Given persistent symptoms, our recommendation is to have her PD cath removed, rest her abdomen, and transition to HD. Discussed with patient and she is agreeable.  Would like to transition back to her previous HD unit, [place]. Will ask SW to arrange on Monday. 2.  ESRD: No further PD - resume HD today with functional AVF. Has been sen by [facility] for PD cath removal this week. Plans pending Next HD Tuesday 3. Hypokalemia: Has not been taking PO KCl at home. Corrected with  IV K - 3.9 today - K 3.4 -today diet resumed should improve 4.  Hypertension/volume: BP stable - no edema. net UF 2 L 3/27  post wt 71.8 Titrate to new EDW 5.  Anemia: Hgb 12.4 >11.1  - no ESA needed yet 6.  Metabolic bone disease: Ca/P ok Continue home binders. velphoro 3 ac/sensipar/calcitriol  7.   Nutrition: Alb 1.5 - added pro-stat supps/nepro - diet advanced but not eating much yet  Myriam Jacobson, PA-C [place] [phone] 10/30/2019,9:55 AM  LOS: 2 days   Subjective:   Can't tolerate PO pain med - makes her nauseated (I don't see any ordered) - only tolerates IV pain med.  Belly still hurts especially last night. No problems with dialysis yesterday.  Objective Vitals:   10/29/19 1500 10/29/19 1503 10/29/19 2114  10/30/19 0541  BP: (!) 147/84 (!) 138/99 124/83 (!) 134/92  Pulse: (!) 113 97 (!) 111 (!) 106  Resp:  16 16 16   Temp:  98.7 F (37.1 C) 99.2 F (37.3 C) 98.4 F (36.9 C)  TempSrc:  Oral Oral Oral  SpO2:  98% 96% 95%  Weight:  71.8 kg    Height:       Physical Exam General: NAD breathing easily on room air Heart: tachy reg  Lungs: no rales Abdomen: soft mild tenderness Extremities: no LE edema Dialysis Access: left upper AVF + bruit/PD cath   Additional Objective Labs: Basic Metabolic Panel: Recent Labs  Lab 10/28/19 1114 10/29/19 0411 10/30/19 0432  NA 132* 130* 134*  K 2.7* 3.9 3.4*  CL 86* 87* 94*  CO2 25 25 27   GLUCOSE 93 56* 78  BUN 46* 47* 22*  CREATININE 12.03* 12.38* 7.56*  CALCIUM 9.5 9.2 8.9  PHOS  --  8.3* 3.7   Liver Function Tests: Recent Labs  Lab 10/28/19 1114 10/29/19 0411 10/30/19 0432  AST 16  --   --   ALT 17  --   --   ALKPHOS 162*  --   --   BILITOT 4.4*  --   --   PROT 6.4*  --   --   ALBUMIN 1.9* 1.6* 1.6*   Recent Labs  Lab 10/28/19 1114  LIPASE 18   CBC: Recent Labs  Lab 10/28/19 1114 10/29/19 0411 10/30/19 0432  WBC 9.5 21.4* 21.6*  HGB 12.9 12.4 11.1*  HCT 38.2 35.5* 33.3*  MCV 96.7 93.7 96.2  PLT 254 220 221   Blood Culture    Component Value Date/Time   SDES FLUID PERITONEAL DIALYSATE 10/28/2019 1550   SPECREQUEST NONE 10/28/2019 1550   CULT  10/28/2019 1550    NO GROWTH < 24 HOURS Performed at [facility], [place] [address] [place], [place] [address]    REPTSTATUS PENDING 10/28/2019 1550    Cardiac Enzymes: No results for input(s): CKTOTAL, CKMB, CKMBINDEX, TROPONINI in the last 168 hours. CBG: Recent Labs  Lab 10/29/19 2020 10/29/19 2338  GLUCAP 84 77   Iron Studies: No results for input(s): IRON, TIBC, TRANSFERRIN, FERRITIN in the last 72 hours. Lab Results  Component Value Date   INR 1.35 03/09/2011   Studies/Results: CT ABDOMEN PELVIS WO CONTRAST  Result Date: 10/28/2019 CLINICAL  DATA:  54 year old female with end-stage renal disease and peritoneal dialysis catheter. Recently hospitalized for acinetobacter peritonitis status post 2 week course of IV antibiotics. Recurrent pain and cloudy peritoneal fluid. Placed back on IV ceftazidime without improvement. Peritoneal cultures positive for gram-negative rods today. Free air on earlier CT without oral contrast. Query extravasation of oral contrast. EXAM: CT ABDOMEN AND PELVIS WITHOUT CONTRAST TECHNIQUE: Multidetector CT imaging of the abdomen and pelvis was performed following the standard protocol without IV contrast. COMPARISON:  CT Abdomen and Pelvis 1644 hours today. Abdominal radiographs 10/04/2019. FINDINGS: Lower chest: Stable lung bases with confluent multifocal lower lobe and right middle lobe opacity which could be atelectasis or pneumonia. No pericardial or pleural effusion. Hepatobiliary: Some vicarious excretion of contrast to the gallbladder. Stable liver and small volume perihepatic fluid. Pancreas: Stable. Spleen: Stable. Adrenals/Urinary Tract: Stable adrenal thickening and native renal atrophy. Decompressed urinary bladder. Stomach/Bowel: Oral contrast was administered and has reached the distal small bowel but not yet the terminal ileum. There was some residual contrast mixed with stool in the cecum previously which appears unchanged. Trace abdominal free fluid with no definite extravasation of oral contrast. Small bowel loops remain thick-walled in the proximal jejunum. No interval dilatation of small bowel. Unchanged large bowel with mild generalized wall thickening also. The volume of free intraperitoneal air is mildly increased in the lower abdomen on series 3, image 65, but stable elsewhere (actually slightly decreased over the liver dome). There are chronic embolization coils in the right lower quadrant (series 3, image 61). This might be related to a previous pelvic renal transplant, uncertain. Negative stomach.  Vascular/Lymphatic: Aortoiliac calcified atherosclerosis. Reproductive: Stable from earlier, Multiple calcified uterine fibroids. Other: Unchanged peritoneal dialysis catheter position in the lower pelvis overlying the decompressed urinary bladder (series 3, image 81). Musculoskeletal: Renal osteodystrophy suspected. Multilevel advanced disc disease with vacuum disc. Grade 1 anterolisthesis in the lower lumbar spine with severe facet arthropathy. No acute osseous abnormality identified. IMPRESSION: 1. No extravasation of the oral contrast which has reached the distal small bowel but not yet the TI at the time of these images. Meanwhile, the overall volume of intraperitoneal air has not significantly changed from 1644 hours today. 2. Otherwise stable CT appearance of the abdomen and pelvis, including proximal small bowel and generalized large bowel wall thickening. 3. Stable lung bases with confluent right middle and bilateral lower lobe atelectasis versus pneumonia. Electronically Signed   By: Genevie Ann M.D.   On: 10/28/2019 21:28   CT ABDOMEN PELVIS W CONTRAST  Result Date: 10/28/2019 CLINICAL DATA:  Abdominal abscesses. Assess for peritonitis or perforation. EXAM: CT ABDOMEN AND PELVIS WITH CONTRAST  TECHNIQUE: Multidetector CT imaging of the abdomen and pelvis was performed using the standard protocol following bolus administration of intravenous contrast. CONTRAST:  182mL OMNIPAQUE IOHEXOL 300 MG/ML  SOLN COMPARISON:  None. FINDINGS: Lower chest: Consolidation of the anterior left lower lobe is identified. Atelectasis of bilateral lung bases are noted. The heart size is normal. Hepatobiliary: No focal liver abnormality is seen. No gallstones, gallbladder wall thickening, or biliary dilatation. Pancreas: Unremarkable. No pancreatic ductal dilatation or surrounding inflammatory changes. Spleen: Normal in size without focal abnormality. Adrenals/Urinary Tract: Bilateral adrenal glands are normal. Atrophy of  bilateral kidneys are noted. The catheter is identified in the bladder. Stomach/Bowel: There is no bowel obstruction. Thick wall small bowel loops are identified in the upper abdomen. There is free air. Postsurgical changes are identified in the right lower quadrant. There is diverticulosis of colon. Vascular/Lymphatic: Aortic atherosclerosis. No enlarged abdominal or pelvic lymph nodes. Reproductive: Calcified uterine fibroids are noted. Other: Small amount of ascites is noted in the abdomen and pelvis. Musculoskeletal: Degenerative joint changes of the spine are noted. IMPRESSION: 1. Thick wall small bowel loops are identified in the upper abdomen. There is free air in the abdomen. The findings are suspicious for bowel perforation. 2. Small amount of ascites in the abdomen and pelvis. 3. Consolidation of the anterior left lower lobe, pneumonia is not excluded. Aortic Atherosclerosis (ICD10-I70.0). These results will be called to the ordering clinician or representative by the Radiologist Assistant, and communication documented in the PACS or [employer]. Electronically Signed   By: Abelardo Diesel M.D.   On: 10/28/2019 17:10   DG Chest Port 1 View  Result Date: 10/28/2019 CLINICAL DATA:  Shortness of breath. EXAM: PORTABLE CHEST 1 VIEW COMPARISON:  03/02/2019 FINDINGS: Vague densities at both lung bases and new from the previous examination. The upper lungs are clear. Negative for a pneumothorax. Again noted is a vascular stent in the left axilla. Heart size is within normal limits. Patient is slightly rotated on the examination. IMPRESSION: Subtle basilar chest densities bilaterally. Findings could represent atelectasis and/or small pleural effusions. No evidence for pulmonary edema. Electronically Signed   By: Markus Daft M.D.   On: 10/28/2019 12:09   Medications: . meropenem (MERREM) IV 500 mg (10/29/19 1738)   . aspirin EC  81 mg Oral Daily  . calcitRIOL  0.25 mcg Oral Daily  . Chlorhexidine  Gluconate Cloth  6 each Topical Q0600  . cinacalcet  90 mg Oral Daily  . feeding supplement (PRO-STAT SUGAR FREE 64)  30 mL Oral BID  . heparin  5,000 Units Subcutaneous Q8H  . ondansetron (ZOFRAN) IV  4 mg Intravenous Once  . sodium chloride flush  3 mL Intravenous Once  . sucroferric oxyhydroxide  1,500 mg Oral TID WC

## 2019-10-30 NOTE — Progress Notes (Signed)
   Subjective:   She states HD went well last night. She continues to have some abdominal pain although this has improved. Denies nausea.   Objective:  Vital signs in last 24 hours: Vitals:   10/29/19 1500 10/29/19 1503 10/29/19 2114 10/30/19 0541  BP: (!) 147/84 (!) 138/99 124/83 (!) 134/92  Pulse: (!) 113 97 (!) 111 (!) 106  Resp:  16 16 16   Temp:  98.7 F (37.1 C) 99.2 F (37.3 C) 98.4 F (36.9 C)  TempSrc:  Oral Oral Oral  SpO2:  98% 96% 95%  Weight:  71.8 kg    Height:        Constitution: NAD, appears stated age  Cardio: tachycardic, regular rhythm, no m/r/g, no LE edema  Abdominal: mildly TTP, soft, non-distended, normal BS Neuro: normal affect, a&ox Skin: c/d/i   Assessment/Plan:  Principal Problem:   Peritonitis (HCC) Active Problems:   ESRD on peritoneal dialysis ([place])   Hypokalemia  54yo female with PMH ESRD on PD with recent admission for Acinetobacter peritonitis s/p IV merrem for 7 days, discharged with ceftazidime with recurrence of abdominal pain and cloudy diasylate after finishing abx.   Recurrent Bacterial Peritonitis ESRD switching to HD NGTD on blood or peritoneal cultures and gram stains negative. On merrem day 3. Tolerated HD well yesterday. She will return to [facility] for HD outpatient when discharged, CLIP in process.   - surgery to remove PD tomorrow, NPO@midnight   - dilaudid prn abdominal pain  - cont. merrem day 2  VTE: heparin IVF: none Diet: renal, NPO@midnight  Code: full  Dispo: Anticipated discharge pending clinical improvement.   Marty Heck, DO 10/30/2019, 6:35 AM Pager: [phone]

## 2019-10-31 ENCOUNTER — Encounter (HOSPITAL_COMMUNITY)
Admission: EM | Disposition: A | Discharge status: Home / Self Care | Payer: Self-pay | Source: Home / Self Care | Attending: Student in an Organized Health Care Education/Training Program

## 2019-10-31 ENCOUNTER — Inpatient Hospital Stay (HOSPITAL_COMMUNITY): Payer: Medicare Other | Admitting: Anesthesiology

## 2019-10-31 DIAGNOSIS — Z79899 Other long term (current) drug therapy: Secondary | ICD-10-CM | POA: Diagnosis not present

## 2019-10-31 DIAGNOSIS — N186 End stage renal disease: Secondary | ICD-10-CM | POA: Diagnosis not present

## 2019-10-31 DIAGNOSIS — Z992 Dependence on renal dialysis: Secondary | ICD-10-CM | POA: Diagnosis not present

## 2019-10-31 DIAGNOSIS — K650 Generalized (acute) peritonitis: Secondary | ICD-10-CM | POA: Diagnosis not present

## 2019-10-31 DIAGNOSIS — D631 Anemia in chronic kidney disease: Secondary | ICD-10-CM | POA: Diagnosis not present

## 2019-10-31 DIAGNOSIS — K659 Peritonitis, unspecified: Secondary | ICD-10-CM | POA: Diagnosis not present

## 2019-10-31 HISTORY — PX: CAPD REMOVAL: SHX5234

## 2019-10-31 LAB — RENAL FUNCTION PANEL
Albumin: 1.6 g/dL — ABNORMAL LOW (ref 3.5–5.0)
Anion gap: 13 (ref 5–15)
BUN: 33 mg/dL — ABNORMAL HIGH (ref 6–20)
CO2: 23 mmol/L (ref 22–32)
Calcium: 7.7 mg/dL — ABNORMAL LOW (ref 8.9–10.3)
Chloride: 94 mmol/L — ABNORMAL LOW (ref 98–111)
Creatinine, Ser: 9.2 mg/dL — ABNORMAL HIGH (ref 0.44–1.00)
GFR calc Af Amer: 5 mL/min — ABNORMAL LOW (ref >60)
GFR calc non Af Amer: 4 mL/min — ABNORMAL LOW (ref >60)
Glucose, Bld: 85 mg/dL (ref 70–99)
Phosphorus: 3.1 mg/dL (ref 2.5–4.6)
Potassium: 3.3 mmol/L — ABNORMAL LOW (ref 3.5–5.1)
Sodium: 130 mmol/L — ABNORMAL LOW (ref 135–145)

## 2019-10-31 LAB — CBC
HCT: 33.4 % — ABNORMAL LOW (ref 36.0–46.0)
Hemoglobin: 10.9 g/dL — ABNORMAL LOW (ref 12.0–15.0)
MCH: 31.9 pg (ref 26.0–34.0)
MCHC: 32.6 g/dL (ref 30.0–36.0)
MCV: 97.7 fL (ref 80.0–100.0)
Platelets: 207 10*3/uL (ref 150–400)
RBC: 3.42 MIL/uL — ABNORMAL LOW (ref 3.87–5.11)
RDW: 13.4 % (ref 11.5–15.5)
WBC: 18.9 10*3/uL — ABNORMAL HIGH (ref 4.0–10.5)
nRBC: 0 % (ref 0.0–0.2)

## 2019-10-31 LAB — GLUCOSE, CAPILLARY
Glucose-Capillary: 114 mg/dL — ABNORMAL HIGH (ref 70–99)
Glucose-Capillary: 117 mg/dL — ABNORMAL HIGH (ref 70–99)
Glucose-Capillary: 59 mg/dL — ABNORMAL LOW (ref 70–99)
Glucose-Capillary: 63 mg/dL — ABNORMAL LOW (ref 70–99)
Glucose-Capillary: 68 mg/dL — ABNORMAL LOW (ref 70–99)
Glucose-Capillary: 82 mg/dL (ref 70–99)
Glucose-Capillary: 86 mg/dL (ref 70–99)

## 2019-10-31 SURGERY — CONTINUOUS AMBULATORY PERITONEAL DIALYSIS  (CAPD) CATHETER REMOVAL
Anesthesia: General | Site: Abdomen

## 2019-10-31 MED ORDER — DEXAMETHASONE SODIUM PHOSPHATE 10 MG/ML IJ SOLN
INTRAMUSCULAR | Status: AC
  Filled 2019-10-31: qty 1

## 2019-10-31 MED ORDER — BUPIVACAINE HCL (PF) 0.25 % IJ SOLN
INTRAMUSCULAR | Status: AC
  Filled 2019-10-31: qty 30

## 2019-10-31 MED ORDER — ONDANSETRON HCL 4 MG/2ML IJ SOLN
INTRAMUSCULAR | Status: DC | PRN
  Administered 2019-10-31: 4 mg via INTRAVENOUS

## 2019-10-31 MED ORDER — MIDAZOLAM HCL 2 MG/2ML IJ SOLN
INTRAMUSCULAR | Status: AC
  Filled 2019-10-31: qty 2

## 2019-10-31 MED ORDER — DEXTROSE 50 % IV SOLN
INTRAVENOUS | Status: AC
  Filled 2019-10-31: qty 50

## 2019-10-31 MED ORDER — ACETAMINOPHEN 500 MG PO TABS: 1000.0000 mg | ORAL_TABLET | Freq: Once | ORAL | Status: DC

## 2019-10-31 MED ORDER — ONDANSETRON HCL 4 MG/2ML IJ SOLN
INTRAMUSCULAR | Status: AC
  Filled 2019-10-31: qty 2

## 2019-10-31 MED ORDER — DEXTROSE 50 % IV SOLN
12.5000 g | INTRAVENOUS | Status: AC
  Administered 2019-10-31: 12.5 g via INTRAVENOUS

## 2019-10-31 MED ORDER — PHENYLEPHRINE 40 MCG/ML (10ML) SYRINGE FOR IV PUSH (FOR BLOOD PRESSURE SUPPORT)
PREFILLED_SYRINGE | INTRAVENOUS | Status: AC
  Filled 2019-10-31: qty 10

## 2019-10-31 MED ORDER — LACTATED RINGERS IV SOLN: INTRAVENOUS | Status: DC | PRN

## 2019-10-31 MED ORDER — LIDOCAINE 2% (20 MG/ML) 5 ML SYRINGE
INTRAMUSCULAR | Status: AC
  Filled 2019-10-31: qty 5

## 2019-10-31 MED ORDER — DEXAMETHASONE SODIUM PHOSPHATE 10 MG/ML IJ SOLN
INTRAMUSCULAR | Status: DC | PRN
  Administered 2019-10-31: 10 mg via INTRAVENOUS

## 2019-10-31 MED ORDER — PROPOFOL 10 MG/ML IV BOLUS
INTRAVENOUS | Status: AC
  Filled 2019-10-31: qty 40

## 2019-10-31 MED ORDER — PROPOFOL 10 MG/ML IV BOLUS
INTRAVENOUS | Status: DC | PRN
  Administered 2019-10-31: 130 mg via INTRAVENOUS

## 2019-10-31 MED ORDER — FENTANYL CITRATE (PF) 250 MCG/5ML IJ SOLN
INTRAMUSCULAR | Status: AC
  Filled 2019-10-31: qty 5

## 2019-10-31 MED ORDER — OXYCODONE HCL 5 MG PO TABS: 5.0000 mg | ORAL_TABLET | ORAL | Status: DC | PRN

## 2019-10-31 MED ORDER — PHENYLEPHRINE HCL-NACL 10-0.9 MG/250ML-% IV SOLN
INTRAVENOUS | Status: DC | PRN
  Administered 2019-10-31: 20 ug/min via INTRAVENOUS
  Administered 2019-10-31: 40 ug/min via INTRAVENOUS

## 2019-10-31 MED ORDER — FENTANYL CITRATE (PF) 250 MCG/5ML IJ SOLN
INTRAMUSCULAR | Status: DC | PRN
  Administered 2019-10-31: 50 ug via INTRAVENOUS
  Administered 2019-10-31 (×2): 25 ug via INTRAVENOUS

## 2019-10-31 MED ORDER — LIDOCAINE HCL (CARDIAC) PF 100 MG/5ML IV SOSY
PREFILLED_SYRINGE | INTRAVENOUS | Status: DC | PRN
  Administered 2019-10-31: 100 mg via INTRATRACHEAL

## 2019-10-31 MED ORDER — MIDAZOLAM HCL 2 MG/2ML IJ SOLN
INTRAMUSCULAR | Status: DC | PRN
  Administered 2019-10-31 (×2): .5 mg via INTRAVENOUS

## 2019-10-31 MED ORDER — BUPIVACAINE-EPINEPHRINE 0.25% -1:200000 IJ SOLN
INTRAMUSCULAR | Status: DC | PRN
  Administered 2019-10-31: 20 mL

## 2019-10-31 MED ORDER — PROPOFOL 10 MG/ML IV BOLUS
INTRAVENOUS | Status: AC
  Filled 2019-10-31: qty 20

## 2019-10-31 MED ORDER — CELECOXIB 200 MG PO CAPS: 200.0000 mg | ORAL_CAPSULE | Freq: Once | ORAL | Status: DC

## 2019-10-31 MED ORDER — EPHEDRINE 5 MG/ML INJ
INTRAVENOUS | Status: AC
  Filled 2019-10-31: qty 10

## 2019-10-31 MED ORDER — STERILE WATER FOR IRRIGATION IR SOLN
Status: DC | PRN
  Administered 2019-10-31: 1000 mL

## 2019-10-31 MED ORDER — 0.9 % SODIUM CHLORIDE (POUR BTL) OPTIME
TOPICAL | Status: DC | PRN
  Administered 2019-10-31: 1000 mL

## 2019-10-31 MED FILL — Vancomycin HCl For IV Soln 500 MG (Base Equivalent): INTRAVENOUS | Qty: 500 | Status: AC

## 2019-10-31 MED FILL — Vancomycin HCl For IV Soln 1 GM (Base Equivalent): INTRAVENOUS | Qty: 2000 | Status: AC

## 2019-10-31 SURGICAL SUPPLY — 28 items
CANISTER SUCT 3000ML PPV (MISCELLANEOUS) ×3 IMPLANT
COVER SURGICAL LIGHT HANDLE (MISCELLANEOUS) ×3 IMPLANT
DERMABOND ADVANCED (GAUZE/BANDAGES/DRESSINGS) ×2
DERMABOND ADVANCED .7 DNX12 (GAUZE/BANDAGES/DRESSINGS) ×1 IMPLANT
DRAPE LAPAROTOMY T 102X78X121 (DRAPES) ×3 IMPLANT
ELECT REM PT RETURN 9FT ADLT (ELECTROSURGICAL) ×3
ELECTRODE REM PT RTRN 9FT ADLT (ELECTROSURGICAL) ×1 IMPLANT
GAUZE 4X4 16PLY RFD (DISPOSABLE) ×3 IMPLANT
GLOVE BIO SURGEON STRL SZ7 (GLOVE) ×3 IMPLANT
GLOVE BIO SURGEON STRL SZ7.5 (GLOVE) ×3 IMPLANT
GLOVE ECLIPSE 7.0 STRL STRAW (GLOVE) ×3 IMPLANT
GLOVE SURG SS PI 7.0 STRL IVOR (GLOVE) ×6 IMPLANT
GOWN STRL REUS W/ TWL LRG LVL3 (GOWN DISPOSABLE) ×2 IMPLANT
GOWN STRL REUS W/ TWL XL LVL3 (GOWN DISPOSABLE) ×1 IMPLANT
GOWN STRL REUS W/TWL LRG LVL3 (GOWN DISPOSABLE) ×6
GOWN STRL REUS W/TWL XL LVL3 (GOWN DISPOSABLE) ×3
KIT BASIN OR (CUSTOM PROCEDURE TRAY) ×3 IMPLANT
KIT TURNOVER KIT B (KITS) ×3 IMPLANT
NEEDLE HYPO 25GX1X1/2 BEV (NEEDLE) ×3 IMPLANT
NS IRRIG 1000ML POUR BTL (IV SOLUTION) ×3 IMPLANT
PACK GENERAL/GYN (CUSTOM PROCEDURE TRAY) ×3 IMPLANT
PAD ARMBOARD 7.5X6 YLW CONV (MISCELLANEOUS) ×6 IMPLANT
PENCIL SMOKE EVACUATOR (MISCELLANEOUS) ×3 IMPLANT
SUT MNCRL AB 4-0 PS2 18 (SUTURE) ×3 IMPLANT
SUT VIC AB 3-0 SH 27 (SUTURE) ×3
SUT VIC AB 3-0 SH 27X BRD (SUTURE) ×1 IMPLANT
SYR CONTROL 10ML LL (SYRINGE) ×3 IMPLANT
TOWEL GREEN STERILE FF (TOWEL DISPOSABLE) ×3 IMPLANT

## 2019-10-31 NOTE — Progress Notes (Signed)
[facility] Progress Note   Subjective: Seen in room. PD removed this am. Feels comfortable. Ate some breakfast.   Objective Vitals:   10/31/19 0818 10/31/19 0825 10/31/19 0830 10/31/19 0902  BP: 124/82  140/87 (!) 155/91  Pulse: 80 81 79 88  Resp:  14 11 14   Temp: 98.4 F (36.9 C)  98.2 F (36.8 C) 98.7 F (37.1 C)  TempSrc:    Oral  SpO2:  100% 97% 96%  Weight:      Height:        Weight change: 1.7 kg  76.5 kg  Additional Objective Labs: Basic Metabolic Panel: Recent Labs  Lab 10/29/19 0411 10/30/19 0432 10/31/19 0531  NA 130* 134* 130*  K 3.9 3.4* 3.3*  CL 87* 94* 94*  CO2 25 27 23   GLUCOSE 56* 78 85  BUN 47* 22* 33*  CREATININE 12.38* 7.56* 9.20*  CALCIUM 9.2 8.9 7.7*  PHOS 8.3* 3.7 3.1   CBC: Recent Labs  Lab 10/28/19 1114 10/28/19 1114 10/29/19 0411 10/30/19 0432 10/31/19 0531  WBC 9.5   < > 21.4* 21.6* 18.9*  HGB 12.9   < > 12.4 11.1* 10.9*  HCT 38.2   < > 35.5* 33.3* 33.4*  MCV 96.7  --  93.7 96.2 97.7  PLT 254   < > 220 221 207   < > = values in this interval not displayed.   Blood Culture    Component Value Date/Time   SDES FLUID PERITONEAL DIALYSATE 10/28/2019 1550   SPECREQUEST NONE 10/28/2019 1550   CULT  10/28/2019 1550    RARE GRAM NEGATIVE RODS CRITICAL RESULT CALLED TO, READ BACK BY AND VERIFIED WITH: RN Ileene Rubens [phone] FCP    REPTSTATUS PENDING 10/28/2019 1550     Physical Exam General: Alert, in bed, nad  Heart: RRR Lungs: clear bilaterally  Abdomen: soft, non-tender  Extremities: No sig LE edema  Dialysis Access: LUE AVF +bruit   Medications: . meropenem (MERREM) IV 500 mg (10/30/19 1729)   . [START ON 11/01/2019] aspirin EC  81 mg Oral Daily  . calcitRIOL  0.25 mcg Oral Daily  . Chlorhexidine Gluconate Cloth  6 each Topical Q0600  . cinacalcet  90 mg Oral Daily  . feeding supplement (NEPRO CARB STEADY)  237 mL Oral BID BM  . feeding supplement (PRO-STAT SUGAR FREE 64)  30 mL Oral BID  .  heparin  5,000 Units Subcutaneous Q8H  . multivitamin  1 tablet Oral QHS  . ondansetron (ZOFRAN) IV  4 mg Intravenous Once  . sodium chloride flush  3 mL Intravenous Once  . sucroferric oxyhydroxide  1,500 mg Oral TID WC    Dialysis Orders:  CCPD ---> Now to transition to incenter HD   Assessment/Plan: 1. Sepsis 2/2 persistent peritonitis: Recent admit for Acinetobacter peritonitis - s/p IV mero x 7d, d/c with plan for 7d Ceftazidime. Within few days of finishing antibiotics, she had recurrent abdominal pain and cloudy effluent. PD Cx 3/22 growing gram negative bacilli -> awaiting final, but assuming Acinetobacter again.  ID involved and now on meropenem.  Abd/pelvic CT no acute findings;Given persistent symptoms, our recommendation is to have her PD cath removed, rest her abdomen, and transition to HD. Discussed with patient and she is agreeable. S/p removal of PD cath this am. 2. ESRD:No further PD - resume HD with functional AVF TTS schedule here. PD cath out 3/29.  Next HD Tuesday. Would like to transition back to her previous HD unit,  [place] M[facility]. Will ask SW CLIP for OP HD.   3. Hypokalemia:Has not been taking PO KCl at home. S/p IV K+ repletion. K+ 3.3 today-- to resume diet,  should improve 4. Hypertension/volume:BP stable - no edema.net UF 2 L 3/27  post wt 71.8 Titrate to new EDW 5. Anemia:Hgb 12.4 >11.1 >10.9   - no ESA needed yet 6. Metabolic bone disease:Ca/P ok Continue home binders. velphoro 3 ac/sensipar/calcitriol 7.   Nutrition:Alb <2.  added pro-stat supps/nepro - diet advanced but not eating much yet   Lynnda Child PA-C [facility] Pager [phone] 10/31/2019,10:45 AM  LOS: 3 days

## 2019-10-31 NOTE — Progress Notes (Signed)
   Subjective: Pt and her mother seen at the bedside this morning after procedure to remove her PD catheter. Feeling alright. Endorsing some abdominal pain at the old catheter site, and just received dilaudid. No acute concerns at this time, all questions and concerns addressed.  Objective:  Vital signs in last 24 hours: Vitals:   10/30/19 0541 10/30/19 1828 10/30/19 2032 10/31/19 0431  BP: (!) 134/92 (!) 147/98 (!) 144/90 (!) 143/95  Pulse: (!) 106 96 92 90  Resp: 16 16 14 18   Temp: 98.4 F (36.9 C) 98.1 F (36.7 C) 98 F (36.7 C) 98.7 F (37.1 C)  TempSrc: Oral Oral Oral Oral  SpO2: 95% 100% 99% 97%  Weight:   76.5 kg   Height:       Physical Exam Vitals and nursing note reviewed.  Constitutional:      General: She is not in acute distress.    Appearance: She is not ill-appearing.     Comments: Pt reserved, but laying back comfortably in bed.  Cardiovascular:     Rate and Rhythm: Normal rate and regular rhythm.     Heart sounds: Normal heart sounds.  Pulmonary:     Effort: Pulmonary effort is normal.  Skin:    General: Skin is warm and dry.  Neurological:     Mental Status: She is alert.    Assessment/Plan:  Principal Problem:   Peritonitis ([place]) Active Problems:   ESRD on peritoneal dialysis ([place])   Hypokalemia  54yo female with PMH ESRD on PD with recent admission for Acinetobacter peritonitis s/p IV merrem for 7 days, discharged with ceftazidime for 7 days and returns with recurrence of abdominal pain and cloudy diasylate a few days after finishing outpatient antibiotics. Peritoneal cell count on 3/26 was >3,100, consistent with recurrent peritonitis.  Recurrent Bacterial Peritonitis ESRD switching to HD Pt tolerated PD catheter removal well this morning. Blood cultures with no growth in 3 days. - peritoneal fluid culture from 3/26 with rare gram negative rods - day 4 of 7 of merrem - PRN Dilaudid 1mg  q4h - labs this AM K 3.3, BUN 33, Cr 9.20, bicarb 23 -  last HD on 3/27 with 2L off, pt tolerated the session well  Nephrology consulted, appreciate their recommendations - using AVF for TTS HD inpatient - next HD tomorrow - renal navigator working on outpatient HD referrals to   Prior to Admission Living Arrangement: home Anticipated Discharge Location: home now on outpatient HD Barriers to Discharge: placement process for outpatient HD Dispo: Anticipated discharge in approximately 1-2 day(s).   Ladona Horns, MD 10/31/2019, 6:20 AM Pager: [phone]

## 2019-10-31 NOTE — Progress Notes (Signed)
Patient ID: Ruth Gutierrez, female   DOB: 12-02-1965, 54 y.o.   MRN: [id number]   Pre Procedure note for inpatients:   Ruth Gutierrez [id number] has been scheduled for Procedure(s): PERITONEAL DIALYSIS  (CAPD) INFECTED CATHETER REMOVAL (N/A) today. The various methods of treatment have been discussed with the patient. After consideration of the risks, benefits and treatment options the patient has consented to the planned procedure.   The patient has been seen and labs reviewed. There are no changes in the patient's condition to prevent proceeding with the planned procedure today.  Recent labs:  Lab Results  Component Value Date   WBC 18.9 (H) 10/31/2019   HGB 10.9 (L) 10/31/2019   HCT 33.4 (L) 10/31/2019   PLT 207 10/31/2019   GLUCOSE 85 10/31/2019   ALT 17 10/28/2019   AST 16 10/28/2019   NA 130 (L) 10/31/2019   K 3.3 (L) 10/31/2019   CL 94 (L) 10/31/2019   CREATININE 9.20 (H) 10/31/2019   BUN 33 (H) 10/31/2019   CO2 23 10/31/2019   TSH 0.009 (L) 02/03/2013   INR 1.35 03/09/2011    Coralie Keens, MD 10/31/2019 6:50 AM

## 2019-10-31 NOTE — Anesthesia Postprocedure Evaluation (Signed)
Anesthesia Post Note  Patient: Ruth Gutierrez [id number]  Procedure(s) Performed: PERITONEAL DIALYSIS  (CAPD) INFECTED CATHETER REMOVAL (N/A Abdomen)     Patient location during evaluation: PACU Anesthesia Type: General Level of consciousness: sedated Pain management: pain level controlled Vital Signs Assessment: post-procedure vital signs reviewed and stable Respiratory status: spontaneous breathing and respiratory function stable Cardiovascular status: stable Postop Assessment: no apparent nausea or vomiting Anesthetic complications: no    Last Vitals:  Vitals:   10/31/19 0830 10/31/19 0902  BP: 140/87 (!) 155/91  Pulse: 79 88  Resp: 11 14  Temp: 36.8 C 37.1 C  SpO2: 97% 96%    Last Pain:  Vitals:   10/31/19 1002  TempSrc:   PainSc: Asleep                 Krystena Reitter DANIEL

## 2019-10-31 NOTE — Transfer of Care (Signed)
Immediate Anesthesia Transfer of Care Note  Patient: Ruth Gutierrez  Procedure(s) Performed: PERITONEAL DIALYSIS  (CAPD) INFECTED CATHETER REMOVAL (N/A Abdomen)  Patient Location: PACU  Anesthesia Type:General  Level of Consciousness: drowsy and patient cooperative  Airway & Oxygen Therapy: Patient Spontanous Breathing and Patient connected to face mask oxygen  Post-op Assessment: Report given to RN, Post -op Vital signs reviewed and stable and Patient moving all extremities X 4  Post vital signs: Reviewed and stable  Last Vitals:  Vitals Value Taken Time  BP 124/82 10/31/19 0818  Temp    Pulse 70 10/31/19 0817  Resp 11 10/31/19 0818  SpO2 98 % 10/31/19 0817  Vitals shown include unvalidated device data.  Last Pain:  Vitals:   10/31/19 0431  TempSrc: Oral  PainSc:       Patients Stated Pain Goal: 0 (07/86/75 4492)  Complications: No apparent anesthesia complications

## 2019-10-31 NOTE — Anesthesia Procedure Notes (Signed)
Procedure Name: LMA Insertion Date/Time: 10/31/2019 7:36 AM Performed by: Kathryne Hitch, CRNA Pre-anesthesia Checklist: Patient identified, Emergency Drugs available, Suction available and Patient being monitored Patient Re-evaluated:Patient Re-evaluated prior to induction Oxygen Delivery Method: Circle system utilized Preoxygenation: Pre-oxygenation with 100% oxygen Induction Type: IV induction Ventilation: Mask ventilation without difficulty LMA: LMA inserted LMA Size: 4.0 Number of attempts: 1 Placement Confirmation: positive ETCO2 and breath sounds checked- equal and bilateral Tube secured with: Tape Dental Injury: Teeth and Oropharynx as per pre-operative assessment  Comments: Performed by Martinique Ingram SRNA

## 2019-10-31 NOTE — Progress Notes (Signed)
Notified MD Zenia Resides via phone call of fluid peritoneal dialysate being positive for gram negative rods per hospital protocol.  Paulla Fore, RN, BSN

## 2019-10-31 NOTE — Progress Notes (Signed)
Renal Navigator received call from Dr. Bartholomew Boards that patient needs modality change from PD to HD for an unknown period of time due to infection of her PD catheter. Patient is requesting HD at [facility] on a MWF schedule.  Renal Navigator met with patient to discuss. She is very quiet, but seems appreciative of Navigator's visit. She reports that she needs MWF because she watches her grandchildren on the weekends. Patient states she has transportation. Navigator informed patient that [place] clinic is not accepting any patients at this time, unfortunately, and that since she lives in [place], asked if she is therefore, interested in OP HD at [place] or [facility]. She states she wants to go to [facility]. She then states she wants first shift. Navigator informed her that this will be passed along to the clinic, but her schedule will depend on what the clinic has available and that first shift MWF is not typically available since it is usually what most people prefer. She states MWF is more important that first shift in this case.  Renal Navigator requested modality change and seat schedule with message to Home Therapy secretary and [place] CM. Renal Navigator will follow closely.  Alphonzo Cruise, Portal Renal Navigator [phone]

## 2019-10-31 NOTE — Op Note (Signed)
PERITONEAL DIALYSIS  (CAPD) INFECTED CATHETER REMOVAL  Procedure Note  Rickesha Veracruz [id number] 10/31/2019   Pre-op Diagnosis: infected PD catheter     Post-op Diagnosis: same  Procedure(s): PERITONEAL DIALYSIS  (CAPD) INFECTED CATHETER REMOVAL  Surgeon(s): Coralie Keens, MD  Anesthesia: General  Staff:  Circulator: Marliss Czar, RN Scrub Person: Lois Huxley; Rolan Bucco  Estimated Blood Loss: Minimal               Procedure: The patient was brought to the operating room and advised correct patient.  She is placed upon the operating table and general anesthesia was induced.  Her abdomen was then prepped and draped in usual sterile fashion.  I anesthetized the skin at the PD catheter site with Marcaine.  I then anesthetized the area of the umbilicus and right subcostal area where the catheter then tunneled with Marcaine as well.  I then made an incision at the small umbilical incision to the right of the umbilicus with a scalpel.  I was able to pull the catheter of the abdominal cavity and excised tissue around the cuff.  I then cut the catheter just proximal to where to it was attached with the metal pieces.  I then had to make an incision at the skin in the right subcostal area as well as at the exit site with a scalpel.  I had excised tissues around the cuffs and cut the catheter in several locations to pull all pieces out of the abdominal wall.  Hemostasis was achieved with cautery.  I closed all incisions with 3-0 Vicryl sutures and running 4-0 Monocryl sutures.  Dermabond was applied.  The patient tolerated procedure well.  All counts were correct at the end of the procedure.  The patient was then extubated in the operating room and taken in stable condition to the recovery room.          Coralie Keens   Date: 10/31/2019  Time: 8:09 AM

## 2019-10-31 NOTE — Progress Notes (Signed)
[facility] has made an exception for this patient, since she has been a patient with them before, and will accept her even though they have been closed to new patients at this time. They have given her a MWF schedule with a seat time of 11:50am. She needs to arrive to her appointments at 11:30am. On her first day at the clinic, she needs to arrive at 11:00am in order to complete consent paperwork prior to her first OP HD treatment.  Renal Navigator informed Nephrologist/Dr. Royce Macadamia, Attending, and met with patient to inform. Patient's daughter was in the room also. Patient given schedule verbally and in writing. Renal Navigator will follow along for discharge and will notify clinic of start date when it has been determined.   Alphonzo Cruise, [place] Renal Navigator [phone]

## 2019-10-31 NOTE — Progress Notes (Signed)
CBG @ 2028 - 65.  Patient given snack tray.  Refused to eat [place] Sandwich.  Nibbling on [employer].   CBG @ 2117 - 75.  Agreed to drink Regular Sprite CBG @ 0000 - 82.  Will continue to monitor and recheck @ 0200. CBG @ 0158 - 59.  12.5 g of Dextrose 50% given.  Patient has no symptoms CBG @ 0232 - 86.  Will continue to monitor patient for S/S of Hypoglycemia CBG @ 0423 - 63.  Patient is asymptomatic.  12.5 g of Dextrose 50% given.  CBG @ 0525 - 117.  Will continue to monitor patient - Internal Medicine Resident made aware.  Earleen Reaper RN

## 2019-11-01 DIAGNOSIS — D689 Coagulation defect, unspecified: Secondary | ICD-10-CM | POA: Insufficient documentation

## 2019-11-01 DIAGNOSIS — Z79899 Other long term (current) drug therapy: Secondary | ICD-10-CM | POA: Diagnosis not present

## 2019-11-01 DIAGNOSIS — K659 Peritonitis, unspecified: Secondary | ICD-10-CM | POA: Diagnosis not present

## 2019-11-01 DIAGNOSIS — Z992 Dependence on renal dialysis: Secondary | ICD-10-CM | POA: Diagnosis not present

## 2019-11-01 DIAGNOSIS — M25511 Pain in right shoulder: Secondary | ICD-10-CM | POA: Insufficient documentation

## 2019-11-01 DIAGNOSIS — D631 Anemia in chronic kidney disease: Secondary | ICD-10-CM | POA: Diagnosis not present

## 2019-11-01 DIAGNOSIS — K650 Generalized (acute) peritonitis: Secondary | ICD-10-CM | POA: Diagnosis not present

## 2019-11-01 DIAGNOSIS — R52 Pain, unspecified: Secondary | ICD-10-CM | POA: Insufficient documentation

## 2019-11-01 DIAGNOSIS — L299 Pruritus, unspecified: Secondary | ICD-10-CM | POA: Insufficient documentation

## 2019-11-01 DIAGNOSIS — N186 End stage renal disease: Secondary | ICD-10-CM | POA: Diagnosis not present

## 2019-11-01 DIAGNOSIS — R0602 Shortness of breath: Secondary | ICD-10-CM | POA: Insufficient documentation

## 2019-11-01 LAB — RENAL FUNCTION PANEL
Albumin: 1.9 g/dL — ABNORMAL LOW (ref 3.5–5.0)
Anion gap: 14 (ref 5–15)
BUN: 52 mg/dL — ABNORMAL HIGH (ref 6–20)
CO2: 26 mmol/L (ref 22–32)
Calcium: 8.2 mg/dL — ABNORMAL LOW (ref 8.9–10.3)
Chloride: 91 mmol/L — ABNORMAL LOW (ref 98–111)
Creatinine, Ser: 10.54 mg/dL — ABNORMAL HIGH (ref 0.44–1.00)
GFR calc Af Amer: 4 mL/min — ABNORMAL LOW (ref >60)
GFR calc non Af Amer: 4 mL/min — ABNORMAL LOW (ref >60)
Glucose, Bld: 89 mg/dL (ref 70–99)
Phosphorus: 3.4 mg/dL (ref 2.5–4.6)
Potassium: 4.2 mmol/L (ref 3.5–5.1)
Sodium: 131 mmol/L — ABNORMAL LOW (ref 135–145)

## 2019-11-01 LAB — BODY FLUID CULTURE

## 2019-11-01 LAB — CBC
HCT: 34.4 % — ABNORMAL LOW (ref 36.0–46.0)
Hemoglobin: 11.4 g/dL — ABNORMAL LOW (ref 12.0–15.0)
MCH: 32.4 pg (ref 26.0–34.0)
MCHC: 33.1 g/dL (ref 30.0–36.0)
MCV: 97.7 fL (ref 80.0–100.0)
Platelets: 215 10*3/uL (ref 150–400)
RBC: 3.52 MIL/uL — ABNORMAL LOW (ref 3.87–5.11)
RDW: 13 % (ref 11.5–15.5)
WBC: 25.4 10*3/uL — ABNORMAL HIGH (ref 4.0–10.5)
nRBC: 0 % (ref 0.0–0.2)

## 2019-11-01 LAB — GLUCOSE, CAPILLARY
Glucose-Capillary: 54 mg/dL — ABNORMAL LOW (ref 70–99)
Glucose-Capillary: 58 mg/dL — ABNORMAL LOW (ref 70–99)

## 2019-11-01 MED ORDER — SODIUM CHLORIDE 0.9 % IV SOLN: 100.0000 mL | INTRAVENOUS | Status: DC | PRN

## 2019-11-01 MED ORDER — HEPARIN SODIUM (PORCINE) 1000 UNIT/ML DIALYSIS: 1000.0000 [IU] | INTRAMUSCULAR | Status: DC | PRN

## 2019-11-01 MED ORDER — LIDOCAINE-PRILOCAINE 2.5-2.5 % EX CREA: 1.0000 "application " | TOPICAL_CREAM | CUTANEOUS | Status: DC | PRN

## 2019-11-01 MED ORDER — LIDOCAINE HCL (PF) 1 % IJ SOLN: 5.0000 mL | INTRAMUSCULAR | Status: DC | PRN

## 2019-11-01 MED ORDER — HEPARIN SODIUM (PORCINE) 1000 UNIT/ML DIALYSIS: 20.0000 [IU]/kg | INTRAMUSCULAR | Status: DC | PRN

## 2019-11-01 MED ORDER — SODIUM CHLORIDE 0.9 % IV SOLN
500.0000 mg | INTRAVENOUS | Status: DC
  Administered 2019-11-01: 500 mg via INTRAVENOUS
  Filled 2019-11-01: qty 0.5

## 2019-11-01 MED ORDER — HYDROMORPHONE HCL 2 MG PO TABS: 2.0000 mg | ORAL_TABLET | Freq: Four times a day (QID) | ORAL | 0 refills | Status: AC | PRN

## 2019-11-01 MED ORDER — ALTEPLASE 2 MG IJ SOLR: 2.0000 mg | Freq: Once | INTRAMUSCULAR | Status: DC | PRN

## 2019-11-01 MED ORDER — CIPROFLOXACIN HCL 500 MG PO TABS: 500.0000 mg | ORAL_TABLET | Freq: Every day | ORAL | 0 refills | Status: AC

## 2019-11-01 MED ORDER — PENTAFLUOROPROP-TETRAFLUOROETH EX AERO: 1.0000 "application " | INHALATION_SPRAY | CUTANEOUS | Status: DC | PRN

## 2019-11-01 NOTE — Discharge Summary (Signed)
Name: Ruth Gutierrez MRN: [id number] DOB: 1966-05-01 54 y.o. PCP: Rexene Agent, MD  Date of Admission: 10/28/2019 11:02 AM Date of Discharge: 11/01/2019 Attending Physician: Joni Reining, DO  Discharge Diagnosis: 1. Recurrent Acinetobacter Peritonitis 2. ESRD on PD transitioned to HD 3. Hypertension 4. Abdominal Free Air  Discharge Medications: Allergies as of 11/01/2019      Reactions   Penicillin G Itching, Rash   Penicillins Itching, Rash   Did it involve swelling of the face/tongue/throat, SOB, or low BP?Y Did it involve sudden or severe rash/hives, skin peeling, or any reaction on the inside of your mouth or nose? Y Did you need to seek medical attention at a hospital or doctor's office? Y When did it last happen?2016 If all above answers are "NO", may proceed with cephalosporin use.      Medication List    TAKE these medications   Bayer Aspirin EC Low Dose 81 MG EC tablet Generic drug: aspirin Take 81 mg by mouth as directed.   calcitRIOL 0.25 MCG capsule Commonly known as: ROCALTROL Take 0.25 mcg by mouth daily.   calcitRIOL 0.5 MCG capsule Commonly known as: ROCALTROL Take 1 capsule (0.5 mcg total) by mouth every Monday, Wednesday, and Friday with hemodialysis.   cinacalcet 90 MG tablet Commonly known as: SENSIPAR Take 90 mg by mouth daily.   ciprofloxacin 500 MG tablet Commonly known as: CIPRO Take 1 tablet (500 mg total) by mouth daily for 14 days. What changed: when to take this   HYDROmorphone 2 MG tablet Commonly known as: Dilaudid Take 1 tablet (2 mg total) by mouth every 6 (six) hours as needed for up to 3 days for severe pain.   polyethylene glycol 17 g packet Commonly known as: MIRALAX / GLYCOLAX Take 17 g by mouth daily. What changed:   when to take this  reasons to take this   potassium chloride SA 20 MEQ tablet Commonly known as: KLOR-CON Take 2 tablets (40 mEq total) by mouth daily.   PriLOSEC OTC 20 MG  tablet Generic drug: omeprazole Take 20 mg by mouth daily as needed for heartburn.   Velphoro 500 MG chewable tablet Generic drug: sucroferric oxyhydroxide Chew 1,500 mg by mouth 3 (three) times daily with meals. Taking one tablet with a snack sometimes       Disposition and follow-up:   Ruth Gutierrez was [facility] in Stable condition.  At the hospital follow up visit please address:  1. Recurrent Acinetobacter Peritonitis: 2nd episode within one month after treatment with merrem & ceftazidime. Treated with merrem until PD catheter removed. Sensitive to cipro and she was transition to two weeks of cipro from the day of PD catheter removal.  2. ESRD on PD transitioned to HD: Transitioned back to HD due to recurrence of peritonitis. Has left AV fistula and tolerated HD well. Will f/u with [facility] clinic for HD from now on.  3. Hypertension 4. Abdominal Free Air: Gen surg consulted. Repeat CT with po contrast done without signs of perf and stable small amount of free air. Thought to be secondary to PD.   2.  Labs / imaging needed at time of follow-up: RFP  3.  Pending labs/ test needing follow-up: none  Follow-up Appointments:   [facility] Course by problem list: Recurrent Acinetobacter Peritonitis ESRD on PD transitioned to HD  Ruth Gutierrez. Ruth Gutierrez is a 54 yo female with PMH ESRD on HD and recent acinetobacter peritonitis  treated with merrem and intraperitoneal ceftazidime for two weeks who presented with increasing abdominal pain, found to have recurrent peritonitis and small volume free air in the abdomen. Prior to readmission she was started on vancomycin but continued to have worsening abdominal symptoms.  At admission nephrology discussed case with infectious disease who recommended treating with merrem. Surgery was also consulted for free air on CT. Repeat CT with oral contrast was ordered which showed no bowel  perforation, and the small amount of free air and bowel edema was thought to be secondary to her PD.  With recurrence of her peritonitis it was decided she should transition back to HD. She was taken to the OR and her PD catheter was removed without complication. She has a previous AV fistula and HD was done x2 within the hospital. Her symptoms of infection and abdominal pain improved. Peritoneal culture grew acinetobacter which was sensitive to Ciprofloxacin. Case was again discussed with ID who recommended two weeks of ciprofloxacin. She was CLIP'd back to her previous HD center and was discharged to follow-up there for HD.   Discharge Vitals:   BP (!) 143/81 (BP Location: Right Arm)   Pulse (!) 104   Temp 98.4 F (36.9 C) (Oral)   Resp 18   Ht 5\' 7"  (1.702 m)   Wt 74.3 kg   LMP 06/02/2015   SpO2 94%   BMI 25.66 kg/m   Pertinent Labs, Studies, and Procedures:   10/28/19 CT abdomen wo contrast  IMPRESSION: 1. No extravasation of the oral contrast which has reached the distal small bowel but not yet the TI at the time of these images. Meanwhile, the overall volume of intraperitoneal air has not significantly changed from 1644 hours today.  2. Otherwise stable CT appearance of the abdomen and pelvis, including proximal small bowel and generalized large bowel wall thickening.  3. Stable lung bases with confluent right middle and bilateral lower lobe atelectasis versus pneumonia.   Electronically Signed   By: Genevie Ann M.D.   On: 10/28/2019 21:28  10/28/19 CT abdomen wcontrast IMPRESSION: 1. Thick wall small bowel loops are identified in the upper abdomen. There is free air in the abdomen. The findings are suspicious for bowel perforation. 2. Small amount of ascites in the abdomen and pelvis. 3. Consolidation of the anterior left lower lobe, pneumonia is not excluded.  Discharge Instructions: Discharge Instructions    Diet - low sodium heart healthy   Complete by: As  directed    Discharge instructions   Complete by: As directed    You were hospitalized for peritonitis. Thank you for allowing [place] to be part of your care.   We arranged for you to follow up at: [facility] Center April 6th, 9:15am  [facility], [address] [place], [place] [address] [phone] Please don't hesitate to call if this doesn't work for your schedule.   Please note these changes made to your medications:   Please Start Ciprofloxacin 500 mg - take one tablet every day for two weeks.   Increase activity slowly   Complete by: As directed       Signed: Marty Heck, DO 11/02/2019, 5:47 PM   Pager: [phone]

## 2019-11-01 NOTE — Final Consult Note (Signed)
Consultant Final Sign-Off Note    Assessment/Final recommendations  Ruth Gutierrez is a 54 y.o. female followed by me for PD catheter removal in the setting of recurrent acetinobacter peritonitis. PD catheter was successfully removed yesterday 10/31/19.    Wound care (if applicable): do not pick at skin glue over incisions - may leave to room air. patient may shower with mild soap and water, should not soak in a bathtub for at least 2 weeks.   Diet at discharge: per primary team   Activity at discharge: No lifting over 10-15lbs for 2 weeks, no lifting over 30 lbs for 4 additional weeks.    Follow-up appointment:  Follow up in our [place] clinic in 3 weeks, our office is arranging this appointment.   Pending results:  Unresulted Labs (From admission, onward)    Start     Ordered   10/29/19 0500  CBC  Daily,   R     10/28/19 1721   10/29/19 0500  Renal function panel  Daily,   R     10/28/19 1721   Signed and Held  CBC  Once,   R     Signed and Held   Signed and Held  Renal function panel  Once,   R     Signed and Held           Medication recommendations: per ID   Other recommendations: call as needed with questions/concerns    Thank you for allowing [place] to participate in the care of your patient!  Please consult [place] again if you have further needs for your patient.  [place] 11/01/2019 9:49 AM    Subjective   Abdominal pain improving - reports soreness around incisions. Denies nausea/vomiting.   Objective  Vital signs in last 24 hours: Temp:  [98.6 F (37 C)-98.7 F (37.1 C)] 98.6 F (37 C) (03/30 0740) Pulse Rate:  [78-100] 78 (03/30 0915) Resp:  [11-19] 17 (03/30 0845) BP: (132-174)/(75-106) 174/105 (03/30 0915) SpO2:  [95 %-97 %] 97 % (03/30 0438) Weight:  [75.8 kg-77.7 kg] 75.8 kg (03/30 0740)  General: alert, NAD, currently in HD Abd: soft, appropriately tender without peritonitis, incisions c/d/i with overlying skin glue and no surrounding  erythema or active drainage.  Pertinent labs and Studies: Recent Labs    10/30/19 0432 10/31/19 0531 11/01/19 0425  WBC 21.6* 18.9* 25.4*  HGB 11.1* 10.9* 11.4*  HCT 33.3* 33.4* 34.4*   BMET Recent Labs    10/31/19 0531 11/01/19 0425  NA 130* 131*  K 3.3* 4.2  CL 94* 91*  CO2 23 26  GLUCOSE 85 89  BUN 33* 52*  CREATININE 9.20* 10.54*  CALCIUM 7.7* 8.2*   No results for input(s): LABURIN in the last 72 hours. Results for orders placed or performed during the hospital encounter of 10/28/19  Culture, blood (routine x 2)     Status: None (Preliminary result)   Collection Time: 10/28/19 12:07 PM   Specimen: BLOOD  Result Value Ref Range Status   Specimen Description BLOOD BLOOD RIGHT FOREARM  Final   Special Requests   Final    BOTTLES DRAWN AEROBIC AND ANAEROBIC Blood Culture adequate volume   Culture NO GROWTH 3 DAYS  Final   Report Status PENDING  Incomplete  Culture, blood (routine x 2)     Status: None (Preliminary result)   Collection Time: 10/28/19 12:07 PM   Specimen: BLOOD  Result Value Ref Range Status   Specimen Description BLOOD BLOOD RIGHT  FOREARM  Final   Special Requests   Final    BOTTLES DRAWN AEROBIC AND ANAEROBIC Blood Culture adequate volume   Culture NO GROWTH 3 DAYS  Final   Report Status PENDING  Incomplete  SARS CORONAVIRUS 2 (TAT 6-24 HRS) Nasopharyngeal Nasopharyngeal Swab     Status: None   Collection Time: 10/28/19  2:04 PM   Specimen: Nasopharyngeal Swab  Result Value Ref Range Status   SARS Coronavirus 2 NEGATIVE NEGATIVE Final    Comment: (NOTE) SARS-CoV-2 target nucleic acids are NOT DETECTED. The SARS-CoV-2 RNA is generally detectable in upper and lower respiratory specimens during the acute phase of infection. Negative results do not preclude SARS-CoV-2 infection, do not rule out co-infections with other pathogens, and should not be used as the sole basis for treatment or other patient management decisions. Negative results must  be combined with clinical observations, patient history, and epidemiological information. The expected result is Negative. Fact Sheet for Patients: SugarRoll.be Fact Sheet for Healthcare Providers: https://www.woods-mathews.com/ This test is not yet approved or cleared by the [place] FDA and  has been authorized for detection and/or diagnosis of SARS-CoV-2 by FDA under an Emergency Use Authorization (EUA). This EUA will remain  in effect (meaning this test can be used) for the duration of the COVID-19 declaration under Section 56 4(b)(1) of the Act, 21 U.S.C. section 360bbb-3(b)(1), unless the authorization is terminated or revoked sooner. Performed at [facility], [place] [address], [place], [place] [address]   Body fluid culture     Status: None (Preliminary result)   Collection Time: 10/28/19  3:50 PM   Specimen: Body Fluid  Result Value Ref Range Status   Specimen Description FLUID PERITONEAL DIALYSATE  Final   Special Requests NONE  Final   Gram Stain   Final    RARE WBC PRESENT,BOTH PMN AND MONONUCLEAR NO ORGANISMS SEEN Performed at [facility], [address], [place], [place] [address]    Culture   Final    RARE Lonell Grandchild NEGATIVE RODS CRITICAL RESULT CALLED TO, READ BACK BY AND VERIFIED WITH: RN Ileene Rubens [phone] FCP    Report Status PENDING  Incomplete  Surgical pcr screen     Status: None   Collection Time: 10/29/19  2:05 AM   Specimen: Nasal Mucosa; Nasal Swab  Result Value Ref Range Status   MRSA, PCR NEGATIVE NEGATIVE Final   Staphylococcus aureus NEGATIVE NEGATIVE Final    Comment: (NOTE) The Xpert SA Assay (FDA approved for NASAL specimens in patients 81 years of age and older), is one component of a comprehensive surveillance program. It is not intended to diagnose infection nor to guide or monitor treatment. Performed at [facility], [place] [address] [place], [place] [address]      Imaging: No results found.

## 2019-11-01 NOTE — Progress Notes (Signed)
   Subjective:   Seen during HD this morning. States abdominal pain has resolved.   Objective:  Vital signs in last 24 hours: Vitals:   10/31/19 0902 10/31/19 1500 10/31/19 2001 11/01/19 0438  BP: (!) 155/91 (!) 150/93 (!) 148/87 (!) 156/91  Pulse: 88 85 98 100  Resp: 14  18 14   Temp: 98.7 F (37.1 C) 98.7 F (37.1 C) 98.6 F (37 C) 98.6 F (37 C)  TempSrc: Oral Oral Oral Oral  SpO2: 96% 95% 95% 97%  Weight:   77.7 kg   Height:       Constitution: NAD, appears stated age Cardio: RRR, no m/r/g, no LE edema  Respiratory: on RA, non-labored breathing Abdominal: NTTP, soft, non-distended, +BS MSK: moving all extremities Neuro: normal affect, a&ox3 Skin: c/d/i   Assessment/Plan:  Principal Problem:   Peritonitis (HCC) Active Problems:   ESRD on peritoneal dialysis ([place])   Hypokalemia  54yo female with PMH ESRD on PD with recent admission for Acinetobacter peritonitis s/p IV merrem for 7 days, discharged with ceftazidime for 7 days who returned with recurrence of abdominal pain and peritonitis.   Recurrent Bacterial Peritonitis ESRD now on HD Received HD today. Will f/u tomorrow to continue at new HD center. Blood culture +for acinetobacter resistant to ceftazidime. Discussed with ID and will treat with two weeks of cipro po.  - day 5 of 7 IV merrem - stop today - cipro for two weeks after PD removal, renally dosed - prn IV dilaudid - f/u for HD tomorrow    VTE: heparin  IVF: none Diet: renal  Code: full   Dispo: Anticipated discharge today.   Marty Heck, DO 11/01/2019, 7:49 AM Pager: [phone]

## 2019-11-01 NOTE — Progress Notes (Signed)
[facility] Progress Note   Subjective:  Seen in HD unit. UFG 2L. Tolerating UF. No complaints today. Has chair for outpatient dialysis.   Objective Vitals:   11/01/19 0845 11/01/19 0915 11/01/19 0945 11/01/19 1015  BP: (!) 171/106 (!) 174/105 (!) 161/107 (!) 165/99  Pulse: 83 78 79 79  Resp: 17     Temp:      TempSrc:      SpO2:      Weight:      Height:        Weight change: 1.2 kg   Additional Objective Labs: Basic Metabolic Panel: Recent Labs  Lab 10/30/19 0432 10/31/19 0531 11/01/19 0425  NA 134* 130* 131*  K 3.4* 3.3* 4.2  CL 94* 94* 91*  CO2 27 23 26   GLUCOSE 78 85 89  BUN 22* 33* 52*  CREATININE 7.56* 9.20* 10.54*  CALCIUM 8.9 7.7* 8.2*  PHOS 3.7 3.1 3.4   CBC: Recent Labs  Lab 10/28/19 1114 10/28/19 1114 10/29/19 0411 10/29/19 0411 10/30/19 0432 10/31/19 0531 11/01/19 0425  WBC 9.5   < > 21.4*   < > 21.6* 18.9* 25.4*  HGB 12.9   < > 12.4   < > 11.1* 10.9* 11.4*  HCT 38.2   < > 35.5*   < > 33.3* 33.4* 34.4*  MCV 96.7  --  93.7  --  96.2 97.7 97.7  PLT 254   < > 220   < > 221 207 215   < > = values in this interval not displayed.   Blood Culture    Component Value Date/Time   SDES FLUID PERITONEAL DIALYSATE 10/28/2019 1550   SPECREQUEST NONE 10/28/2019 1550   CULT  10/28/2019 1550    RARE ACINETOBACTER CALCOACETICUS/BAUMANNII COMPLEX CRITICAL RESULT CALLED TO, READ BACK BY AND VERIFIED WITH: RN Ileene Rubens [phone] FCP Performed at [facility], [place] [address] [place], [place] [address]    REPTSTATUS 11/01/2019 FINAL 10/28/2019 1550     Physical Exam General: Alert, in bed, nad  Heart: RRR Lungs: clear bilaterally  Abdomen: soft, non-tender  Extremities: No sig LE edema  Dialysis Access: LUE AVF in use on HD.   Medications: . [START ON 11/02/2019] sodium chloride    . [START ON 11/02/2019] sodium chloride    . meropenem (MERREM) IV 500 mg (10/31/19 1643)   . aspirin EC  81 mg Oral Daily  . calcitRIOL   0.25 mcg Oral Daily  . Chlorhexidine Gluconate Cloth  6 each Topical Q0600  . cinacalcet  90 mg Oral Daily  . feeding supplement (NEPRO CARB STEADY)  237 mL Oral BID BM  . feeding supplement (PRO-STAT SUGAR FREE 64)  30 mL Oral BID  . heparin  5,000 Units Subcutaneous Q8H  . multivitamin  1 tablet Oral QHS  . ondansetron (ZOFRAN) IV  4 mg Intravenous Once  . sodium chloride flush  3 mL Intravenous Once  . sucroferric oxyhydroxide  1,500 mg Oral TID WC    Dialysis Orders:  [facility] ---> Now to transition to incenter HD   Assessment/Plan: 1. Sepsis 2/2 persistent peritonitis: Recent admit for Acinetobacter peritonitis - s/p IV mero x 7d, d/c with plan for 7d Ceftazidime. Within few days of finishing antibiotics, she had recurrent abdominal pain and cloudy effluent. PD Cx 3/22 + Acinetobacter at outpatient unit. PD Cx 3/26 also Acinetobacter  ID involved and now on meropenem.  Abd/pelvic CT no acute findings;Given persistent symptoms, our recommendation is to have  her PD cath removed, rest her abdomen, and transition to HD. S/p removal of PD cath 3/29. Will need clarification for continuation of antibiotics after discharge.  2. ESRD:No further PD - resume HD with functional AVF TTS schedule here. PD cath out 3/29.  Next HD Tuesday. Has been clipped to  previous HD unit, [place] MWF 2nd shift. To start there tomorrow 3/31 3. Hypokalemia:Has not been taking PO KCl at home. S/p IV K+ repletion.  To resume diet,  should improve. K+ 4.2.  4. Hypertension/volume:BP stable - no edema.net UF 2 L 3/27  post wt 71.8 Titrate to new EDW 5. Anemia:Hgb 12.4 >11.1 >10.9   - no ESA needed yet 6. Metabolic bone disease:Ca/P ok Continue home binders. velphoro 3 ac/sensipar/calcitriol 7.   Nutrition:Alb <2.  added pro-stat supps/nepro - diet advanced but not eating much yet   Lynnda Child PA-C [facility] Pager [phone] 11/01/2019,10:37 AM  LOS: 4 days

## 2019-11-01 NOTE — Progress Notes (Signed)
Pharmacy Antibiotic Note  Ruth Gutierrez is a 54 y.o. female admitted on 10/28/2019 with Peritonitis.   Pt was started on vanc and ceftazidime before optimizing to meropenem after ID suggestion. PD cath has been removed. Pt has transitioned to HD now. Culture grew out ACINETOBACTER CALCOACETICUS/BAUMANNII COMPLEX that is sens to imipenem, cipro, amp/sulbactam, and gent.  IMTS is contacting ID for LOT recommendation. PO cipro would be a good option.    Height: 5\' 7"  (170.2 cm) Weight: 163 lb 12.8 oz (74.3 kg) IBW/kg (Calculated) : 61.6  Temp (24hrs), Avg:98.4 F (36.9 C), Min:97.3 F (36.3 C), Max:98.7 F (37.1 C)  Recent Labs  Lab 10/28/19 1114 10/28/19 1235 10/29/19 0411 10/30/19 0432 10/31/19 0531 11/01/19 0425  WBC 9.5  --  21.4* 21.6* 18.9* 25.4*  CREATININE 12.03*  --  12.38* 7.56* 9.20* 10.54*  LATICACIDVEN  --  1.1  --   --   --   --     Estimated Creatinine Clearance: 6.6 mL/min (A) (by C-G formula based on SCr of 10.54 mg/dL (H)).    Allergies  Allergen Reactions  . Penicillin G Itching and Rash  . Penicillins Itching and Rash    Did it involve swelling of the face/tongue/throat, SOB, or low BP?Y Did it involve sudden or severe rash/hives, skin peeling, or any reaction on the inside of your mouth or nose? Y Did you need to seek medical attention at a hospital or doctor's office? Y When did it last happen?2016 If all above answers are "NO", may proceed with cephalosporin use.    Antimicrobials this admission: 3/26 Ceftazidime x1 3/26 Vancomycin x1 3/27 Merrem>>   Dose adjustments this admission: N/a  Microbiology results: 3/27 MRSA PCR neg 3/26 COVID neg 3/26 peritoneal>>ACINETOBACTER CALCOACETICUS/BAUMANNII COMPLEX Outpx peritoneal>>acinetobacter   Plan:  Cont Merrem while here F/u with dc abx  Onnie Boer, PharmD, BCIDP, AAHIVP, CPP Infectious Disease Pharmacist 11/01/2019 2:15 PM

## 2019-11-01 NOTE — Progress Notes (Signed)
DISCHARGE NOTE HOME Ruth Gutierrez to be discharged Home per MD order. Discussed prescriptions and follow up appointments with the patient. Prescriptions given to patient; medication list explained in detail. Patient verbalized understanding.  Skin clean, dry and intact without evidence of skin break down, no evidence of skin tears noted. IV catheter discontinued intact. Site without signs and symptoms of complications. Dressing and pressure applied. Pt denies pain at the site currently. No complaints noted.  Patient free of lines, drains, and wounds.   An After Visit Summary (AVS) was printed and given to the patient. Patient escorted via wheelchair, and discharged home via private auto.  Aneta Mins BSN, RN3

## 2019-11-01 NOTE — Procedures (Signed)
Seen and examined on dialysis at 8:40 am procedure supervised.  Blood pressure 133/83 and HR 89.  Tolerating goal.  Left AVF in use. On meropenem for Acetinobacter peritonitis - PD catheter out.  Has a spot at [facility] MWF 2nd shift time of 11:30.  (arrival of 11am on first treatment).  She was unable to be dialyzed yesterday to transition to MWF schedule.  Dialyzing today.  Spoke with team on 3/29 and they were contacting ID regarding recommendations.   Claudia Desanctis, MD 11/01/2019 8:46 AM

## 2019-11-02 DIAGNOSIS — Z992 Dependence on renal dialysis: Secondary | ICD-10-CM | POA: Diagnosis not present

## 2019-11-02 DIAGNOSIS — N2581 Secondary hyperparathyroidism of renal origin: Secondary | ICD-10-CM | POA: Diagnosis not present

## 2019-11-02 DIAGNOSIS — N186 End stage renal disease: Secondary | ICD-10-CM | POA: Diagnosis not present

## 2019-11-02 LAB — CULTURE, BLOOD (ROUTINE X 2)
Culture: NO GROWTH
Culture: NO GROWTH
Special Requests: ADEQUATE
Special Requests: ADEQUATE

## 2019-11-02 LAB — PATHOLOGIST SMEAR REVIEW

## 2019-11-03 DIAGNOSIS — I129 Hypertensive chronic kidney disease with stage 1 through stage 4 chronic kidney disease, or unspecified chronic kidney disease: Secondary | ICD-10-CM | POA: Diagnosis not present

## 2019-11-03 DIAGNOSIS — Z992 Dependence on renal dialysis: Secondary | ICD-10-CM | POA: Diagnosis not present

## 2019-11-03 DIAGNOSIS — N186 End stage renal disease: Secondary | ICD-10-CM | POA: Diagnosis not present

## 2019-11-04 DIAGNOSIS — N186 End stage renal disease: Secondary | ICD-10-CM | POA: Diagnosis not present

## 2019-11-04 DIAGNOSIS — E1129 Type 2 diabetes mellitus with other diabetic kidney complication: Secondary | ICD-10-CM | POA: Diagnosis not present

## 2019-11-04 DIAGNOSIS — R509 Fever, unspecified: Secondary | ICD-10-CM | POA: Diagnosis not present

## 2019-11-04 DIAGNOSIS — K659 Peritonitis, unspecified: Secondary | ICD-10-CM | POA: Diagnosis not present

## 2019-11-04 DIAGNOSIS — D631 Anemia in chronic kidney disease: Secondary | ICD-10-CM | POA: Diagnosis not present

## 2019-11-04 DIAGNOSIS — N2581 Secondary hyperparathyroidism of renal origin: Secondary | ICD-10-CM | POA: Diagnosis not present

## 2019-11-04 DIAGNOSIS — Z992 Dependence on renal dialysis: Secondary | ICD-10-CM | POA: Diagnosis not present

## 2019-11-07 DIAGNOSIS — K659 Peritonitis, unspecified: Secondary | ICD-10-CM | POA: Diagnosis not present

## 2019-11-07 DIAGNOSIS — R509 Fever, unspecified: Secondary | ICD-10-CM | POA: Diagnosis not present

## 2019-11-07 DIAGNOSIS — N186 End stage renal disease: Secondary | ICD-10-CM | POA: Diagnosis not present

## 2019-11-07 DIAGNOSIS — N2581 Secondary hyperparathyroidism of renal origin: Secondary | ICD-10-CM | POA: Diagnosis not present

## 2019-11-07 DIAGNOSIS — Z992 Dependence on renal dialysis: Secondary | ICD-10-CM | POA: Diagnosis not present

## 2019-11-07 DIAGNOSIS — E1129 Type 2 diabetes mellitus with other diabetic kidney complication: Secondary | ICD-10-CM | POA: Diagnosis not present

## 2019-11-08 ENCOUNTER — Ambulatory Visit (INDEPENDENT_AMBULATORY_CARE_PROVIDER_SITE_OTHER): Payer: Medicare Other | Admitting: Internal Medicine

## 2019-11-08 ENCOUNTER — Encounter: Payer: Self-pay | Admitting: Internal Medicine

## 2019-11-08 ENCOUNTER — Other Ambulatory Visit: Payer: Self-pay

## 2019-11-08 VITALS — BP 140/93 | HR 107 | Temp 98.2°F | Ht 67.0 in | Wt 166.2 lb

## 2019-11-08 DIAGNOSIS — Z992 Dependence on renal dialysis: Secondary | ICD-10-CM

## 2019-11-08 DIAGNOSIS — B9689 Other specified bacterial agents as the cause of diseases classified elsewhere: Secondary | ICD-10-CM | POA: Diagnosis not present

## 2019-11-08 DIAGNOSIS — F17210 Nicotine dependence, cigarettes, uncomplicated: Secondary | ICD-10-CM

## 2019-11-08 DIAGNOSIS — K652 Spontaneous bacterial peritonitis: Secondary | ICD-10-CM

## 2019-11-08 DIAGNOSIS — A498 Other bacterial infections of unspecified site: Secondary | ICD-10-CM | POA: Diagnosis not present

## 2019-11-08 DIAGNOSIS — Z792 Long term (current) use of antibiotics: Secondary | ICD-10-CM

## 2019-11-08 DIAGNOSIS — N186 End stage renal disease: Secondary | ICD-10-CM | POA: Diagnosis not present

## 2019-11-08 NOTE — Progress Notes (Signed)
CC: establish care and hospital follow-up  HPI:  [name] is a 54 y.o. F with significant PMH as outlined below who was recently hospitalized for recurrent acinetobacter peritonitis and ESRD on PD transitioned to HD. She presented today for hospital follow-up and to establish care. Please see problem-based charting for additional information.  Past Medical History:  Diagnosis Date  . Anemia of chronic disease   . Arthritis   . Deceased-donor kidney transplant    Performed at [facility], April 2010.  Initial ESRD due to HTN nephropathy  . Eczema   . ESRD (end stage renal disease) ([place])    s/p transplant creatinine baseline 1.1  M/W/F dialysis  . FUO (fever of unknown origin) 05/17/2015  . GERD (gastroesophageal reflux disease)   . Headache(784.0)   . History of hyperparathyroidism   . Hypertension   . Peritonitis ([place]) 10/2019  . Shortness of breath   . Wears glasses    Social History   Tobacco Use  . Smoking status: Current Every Day Smoker    Packs/day: 0.50    Years: 27.00    Pack years: 13.50    Types: Cigarettes  . Smokeless tobacco: Never Used  . Tobacco comment: 10 cigarettes a day  Substance Use Topics  . Alcohol use: No    Alcohol/week: 0.0 standard drinks    Comment: occasional drinker noted in the past  . Drug use: No   Family History  Problem Relation Age of Onset  . Hypertension Mother   . Hypertension Father   . Diabetes Brother   . Deep vein thrombosis Brother   . Hypertension Sister   . Hyperlipidemia Sister   . Kidney disease Brother        on HD  . Hypertension Sister   . Colon cancer Neg Hx   . Esophageal cancer Neg Hx   . Rectal cancer Neg Hx    Review of Systems:   Review of Systems  Constitutional: Positive for malaise/fatigue. Negative for chills and fever.  Respiratory: Negative for shortness of breath.   Cardiovascular: Negative for chest pain and leg swelling.  Gastrointestinal: Negative for abdominal pain,  diarrhea, nausea and vomiting.  Genitourinary: Negative for dysuria and frequency.  Skin: Negative for rash.       Occasional bleeding over PD cath site   Physical Exam:  Vitals:   11/08/19 0913  BP: (!) 140/93  Pulse: (!) 107  Temp: 98.2 F (36.8 C)  TempSrc: Oral  SpO2: 100%  Weight: 166 lb 3.2 oz (75.4 kg)  Height: 5\' 7"  (1.702 m)   Physical Exam Vitals and nursing note reviewed.  Constitutional:      General: She is not in acute distress.    Comments: Pt well appearing, ambulating unassisted   Cardiovascular:     Rate and Rhythm: Regular rhythm. Tachycardia present.     Heart sounds: Normal heart sounds.  Pulmonary:     Effort: Pulmonary effort is normal.     Breath sounds: Normal breath sounds. No wheezing, rhonchi or rales.  Abdominal:     General: Abdomen is flat. Bowel sounds are normal.     Palpations: Abdomen is soft.     Comments: Tender to palpation on R side around previous PD cath site.  Musculoskeletal:        General: Normal range of motion.     Right lower leg: No edema.     Left lower leg: No edema.  Skin:  Neurological:     General: No focal deficit present.     Mental Status: She is alert.  Psychiatric:        Mood and Affect: Mood normal.    Assessment & Plan:   See Encounters Tab for problem based charting.  Patient discussed with Dr. Heber Edenton

## 2019-11-08 NOTE — Assessment & Plan Note (Signed)
Pt transitioned from PD to HD during recent hospitalization for recurrent peritonitis. PD catheter removed on 3/29. Pt going to [facility] clinic on MWF and is being accessed through her prior L AVF. States it has been going well. She believes she is still up from her dry weight and says they are continuing to work on pulling off extra fluid at HD. Pt is 3lbs up today from weight at discharge last week. On exam, pt with mild pitting edema of bilateral feet, normal respiratory effort without crackles, and mild abdominal fluid.   - will continue to follow-up nephrology notes/recommendations  Wt Readings from Last 3 Encounters:  11/08/19 166 lb 3.2 oz (75.4 kg)  11/01/19 163 lb 12.8 oz (74.3 kg)  10/09/19 162 lb 0.6 oz (73.5 kg)

## 2019-11-08 NOTE — Patient Instructions (Addendum)
Ruth Gutierrez,  It was nice seeing you again today! I am glad you are starting to feel better after getting out of the hospital. Please continue to take the antibiotic (ciprofloxacin) for another week.  We will get some basic blood work from you today, and I will call you with any abnormal results.  Please follow-up with our clinic in 3 months for continued health maintenance.

## 2019-11-08 NOTE — Assessment & Plan Note (Signed)
Pt hospitalized from 3/2 to 3/8 with acinetobacter peritonitis treated with merrem and intraperitoneal ceftazidime and presented again from 3/26 to 3/30 for recurrent abdominal pain and peritonitis. She was treated with merrem inpatient, and her PD catheter was removed on 3/29. Given the recurrent peritonitis, pt was transitioned back to HD in the hospital through her previous L AV fistula. Repeat peritoneal cultures grew acinetobacter again, sensitive to ciprofloxacin. ID recommended 2 weeks oral cipro treatment from PD catheter removal.  Pt endorses adherence to outpatient antibiotics (500mg  cipro daily) and states she has 1 more week of treatment. Denies medication side effects, nausea/vomiting, diarrhea, or abdominal pain. Has some persistent fatigue after hospitalization, but says this is gradually getting better. Pt having some occasional bleeding from the prior PD catheter site. On examination, area is clean, dry, and intact from previous Dermabond placement. No evidence of continued bleeding or infection.  - pt to continue 1 more week of cipro to complete 14 day course - will repeat CBC and BMP

## 2019-11-09 DIAGNOSIS — Z992 Dependence on renal dialysis: Secondary | ICD-10-CM | POA: Diagnosis not present

## 2019-11-09 DIAGNOSIS — N2581 Secondary hyperparathyroidism of renal origin: Secondary | ICD-10-CM | POA: Diagnosis not present

## 2019-11-09 DIAGNOSIS — E1129 Type 2 diabetes mellitus with other diabetic kidney complication: Secondary | ICD-10-CM | POA: Diagnosis not present

## 2019-11-09 DIAGNOSIS — K659 Peritonitis, unspecified: Secondary | ICD-10-CM | POA: Diagnosis not present

## 2019-11-09 DIAGNOSIS — N186 End stage renal disease: Secondary | ICD-10-CM | POA: Diagnosis not present

## 2019-11-09 DIAGNOSIS — R509 Fever, unspecified: Secondary | ICD-10-CM | POA: Diagnosis not present

## 2019-11-09 LAB — CBC WITH DIFFERENTIAL/PLATELET
Basophils Absolute: 0 10*3/uL (ref 0.0–0.2)
Basos: 0 %
EOS (ABSOLUTE): 0.1 10*3/uL (ref 0.0–0.4)
Eos: 1 %
Hematocrit: 26.4 % — ABNORMAL LOW (ref 34.0–46.6)
Hemoglobin: 8.9 g/dL — ABNORMAL LOW (ref 11.1–15.9)
Immature Grans (Abs): 0.1 10*3/uL (ref 0.0–0.1)
Immature Granulocytes: 1 %
Lymphocytes Absolute: 0.8 10*3/uL (ref 0.7–3.1)
Lymphs: 9 %
MCH: 32.2 pg (ref 26.6–33.0)
MCHC: 33.7 g/dL (ref 31.5–35.7)
MCV: 96 fL (ref 79–97)
Monocytes Absolute: 0.5 10*3/uL (ref 0.1–0.9)
Monocytes: 6 %
Neutrophils Absolute: 7 10*3/uL (ref 1.4–7.0)
Neutrophils: 83 %
Platelets: 223 10*3/uL (ref 150–450)
RBC: 2.76 x10E6/uL — ABNORMAL LOW (ref 3.77–5.28)
RDW: 13.2 % (ref 11.7–15.4)
WBC: 8.5 10*3/uL (ref 3.4–10.8)

## 2019-11-09 LAB — BMP8+ANION GAP
Anion Gap: 18 mmol/L (ref 10.0–18.0)
BUN/Creatinine Ratio: 3 — ABNORMAL LOW (ref 9–23)
BUN: 16 mg/dL (ref 6–24)
CO2: 29 mmol/L (ref 20–29)
Calcium: 8.6 mg/dL — ABNORMAL LOW (ref 8.7–10.2)
Chloride: 95 mmol/L — ABNORMAL LOW (ref 96–106)
Creatinine, Ser: 5.68 mg/dL — ABNORMAL HIGH (ref 0.57–1.00)
GFR calc Af Amer: 9 mL/min/{1.73_m2} — ABNORMAL LOW (ref >59)
GFR calc non Af Amer: 8 mL/min/{1.73_m2} — ABNORMAL LOW (ref >59)
Glucose: 78 mg/dL (ref 65–99)
Potassium: 3.6 mmol/L (ref 3.5–5.2)
Sodium: 142 mmol/L (ref 134–144)

## 2019-11-09 NOTE — Progress Notes (Signed)
Internal Medicine Clinic Attending  Case discussed with Dr. Jones at the time of the visit.  We reviewed the resident's history and exam and pertinent patient test results.  I agree with the assessment, diagnosis, and plan of care documented in the resident's note.  

## 2019-11-11 DIAGNOSIS — N186 End stage renal disease: Secondary | ICD-10-CM | POA: Diagnosis not present

## 2019-11-11 DIAGNOSIS — Z992 Dependence on renal dialysis: Secondary | ICD-10-CM | POA: Diagnosis not present

## 2019-11-11 DIAGNOSIS — K659 Peritonitis, unspecified: Secondary | ICD-10-CM | POA: Diagnosis not present

## 2019-11-11 DIAGNOSIS — R509 Fever, unspecified: Secondary | ICD-10-CM | POA: Diagnosis not present

## 2019-11-11 DIAGNOSIS — E1129 Type 2 diabetes mellitus with other diabetic kidney complication: Secondary | ICD-10-CM | POA: Diagnosis not present

## 2019-11-11 DIAGNOSIS — N2581 Secondary hyperparathyroidism of renal origin: Secondary | ICD-10-CM | POA: Diagnosis not present

## 2019-11-14 DIAGNOSIS — K659 Peritonitis, unspecified: Secondary | ICD-10-CM | POA: Diagnosis not present

## 2019-11-14 DIAGNOSIS — N2581 Secondary hyperparathyroidism of renal origin: Secondary | ICD-10-CM | POA: Diagnosis not present

## 2019-11-14 DIAGNOSIS — Z992 Dependence on renal dialysis: Secondary | ICD-10-CM | POA: Diagnosis not present

## 2019-11-14 DIAGNOSIS — E1129 Type 2 diabetes mellitus with other diabetic kidney complication: Secondary | ICD-10-CM | POA: Diagnosis not present

## 2019-11-14 DIAGNOSIS — R509 Fever, unspecified: Secondary | ICD-10-CM | POA: Diagnosis not present

## 2019-11-14 DIAGNOSIS — N186 End stage renal disease: Secondary | ICD-10-CM | POA: Diagnosis not present

## 2019-11-16 DIAGNOSIS — Z992 Dependence on renal dialysis: Secondary | ICD-10-CM | POA: Diagnosis not present

## 2019-11-16 DIAGNOSIS — N2581 Secondary hyperparathyroidism of renal origin: Secondary | ICD-10-CM | POA: Diagnosis not present

## 2019-11-16 DIAGNOSIS — K659 Peritonitis, unspecified: Secondary | ICD-10-CM | POA: Diagnosis not present

## 2019-11-16 DIAGNOSIS — R509 Fever, unspecified: Secondary | ICD-10-CM | POA: Diagnosis not present

## 2019-11-16 DIAGNOSIS — N186 End stage renal disease: Secondary | ICD-10-CM | POA: Diagnosis not present

## 2019-11-16 DIAGNOSIS — E1129 Type 2 diabetes mellitus with other diabetic kidney complication: Secondary | ICD-10-CM | POA: Diagnosis not present

## 2019-11-18 DIAGNOSIS — K659 Peritonitis, unspecified: Secondary | ICD-10-CM | POA: Diagnosis not present

## 2019-11-18 DIAGNOSIS — N2581 Secondary hyperparathyroidism of renal origin: Secondary | ICD-10-CM | POA: Diagnosis not present

## 2019-11-18 DIAGNOSIS — E1129 Type 2 diabetes mellitus with other diabetic kidney complication: Secondary | ICD-10-CM | POA: Diagnosis not present

## 2019-11-18 DIAGNOSIS — Z992 Dependence on renal dialysis: Secondary | ICD-10-CM | POA: Diagnosis not present

## 2019-11-18 DIAGNOSIS — R509 Fever, unspecified: Secondary | ICD-10-CM | POA: Diagnosis not present

## 2019-11-18 DIAGNOSIS — N186 End stage renal disease: Secondary | ICD-10-CM | POA: Diagnosis not present

## 2019-11-21 DIAGNOSIS — E1129 Type 2 diabetes mellitus with other diabetic kidney complication: Secondary | ICD-10-CM | POA: Diagnosis not present

## 2019-11-21 DIAGNOSIS — Z992 Dependence on renal dialysis: Secondary | ICD-10-CM | POA: Diagnosis not present

## 2019-11-21 DIAGNOSIS — N2581 Secondary hyperparathyroidism of renal origin: Secondary | ICD-10-CM | POA: Diagnosis not present

## 2019-11-21 DIAGNOSIS — R509 Fever, unspecified: Secondary | ICD-10-CM | POA: Diagnosis not present

## 2019-11-21 DIAGNOSIS — K659 Peritonitis, unspecified: Secondary | ICD-10-CM | POA: Diagnosis not present

## 2019-11-21 DIAGNOSIS — N186 End stage renal disease: Secondary | ICD-10-CM | POA: Diagnosis not present

## 2019-11-23 DIAGNOSIS — K659 Peritonitis, unspecified: Secondary | ICD-10-CM | POA: Diagnosis not present

## 2019-11-23 DIAGNOSIS — R509 Fever, unspecified: Secondary | ICD-10-CM | POA: Diagnosis not present

## 2019-11-23 DIAGNOSIS — Z992 Dependence on renal dialysis: Secondary | ICD-10-CM | POA: Diagnosis not present

## 2019-11-23 DIAGNOSIS — N186 End stage renal disease: Secondary | ICD-10-CM | POA: Diagnosis not present

## 2019-11-23 DIAGNOSIS — N2581 Secondary hyperparathyroidism of renal origin: Secondary | ICD-10-CM | POA: Diagnosis not present

## 2019-11-23 DIAGNOSIS — E1129 Type 2 diabetes mellitus with other diabetic kidney complication: Secondary | ICD-10-CM | POA: Diagnosis not present

## 2019-11-25 DIAGNOSIS — K659 Peritonitis, unspecified: Secondary | ICD-10-CM | POA: Diagnosis not present

## 2019-11-25 DIAGNOSIS — R509 Fever, unspecified: Secondary | ICD-10-CM | POA: Diagnosis not present

## 2019-11-25 DIAGNOSIS — N186 End stage renal disease: Secondary | ICD-10-CM | POA: Diagnosis not present

## 2019-11-25 DIAGNOSIS — Z992 Dependence on renal dialysis: Secondary | ICD-10-CM | POA: Diagnosis not present

## 2019-11-25 DIAGNOSIS — N2581 Secondary hyperparathyroidism of renal origin: Secondary | ICD-10-CM | POA: Diagnosis not present

## 2019-11-25 DIAGNOSIS — E1129 Type 2 diabetes mellitus with other diabetic kidney complication: Secondary | ICD-10-CM | POA: Diagnosis not present

## 2019-11-28 DIAGNOSIS — N186 End stage renal disease: Secondary | ICD-10-CM | POA: Diagnosis not present

## 2019-11-28 DIAGNOSIS — Z992 Dependence on renal dialysis: Secondary | ICD-10-CM | POA: Diagnosis not present

## 2019-11-28 DIAGNOSIS — E1129 Type 2 diabetes mellitus with other diabetic kidney complication: Secondary | ICD-10-CM | POA: Diagnosis not present

## 2019-11-28 DIAGNOSIS — K659 Peritonitis, unspecified: Secondary | ICD-10-CM | POA: Diagnosis not present

## 2019-11-28 DIAGNOSIS — R509 Fever, unspecified: Secondary | ICD-10-CM | POA: Diagnosis not present

## 2019-11-28 DIAGNOSIS — N2581 Secondary hyperparathyroidism of renal origin: Secondary | ICD-10-CM | POA: Diagnosis not present

## 2019-11-30 DIAGNOSIS — N2581 Secondary hyperparathyroidism of renal origin: Secondary | ICD-10-CM | POA: Diagnosis not present

## 2019-11-30 DIAGNOSIS — Z992 Dependence on renal dialysis: Secondary | ICD-10-CM | POA: Diagnosis not present

## 2019-11-30 DIAGNOSIS — R509 Fever, unspecified: Secondary | ICD-10-CM | POA: Diagnosis not present

## 2019-11-30 DIAGNOSIS — E1129 Type 2 diabetes mellitus with other diabetic kidney complication: Secondary | ICD-10-CM | POA: Diagnosis not present

## 2019-11-30 DIAGNOSIS — N186 End stage renal disease: Secondary | ICD-10-CM | POA: Diagnosis not present

## 2019-11-30 DIAGNOSIS — K659 Peritonitis, unspecified: Secondary | ICD-10-CM | POA: Diagnosis not present

## 2019-12-02 DIAGNOSIS — E1129 Type 2 diabetes mellitus with other diabetic kidney complication: Secondary | ICD-10-CM | POA: Diagnosis not present

## 2019-12-02 DIAGNOSIS — N2581 Secondary hyperparathyroidism of renal origin: Secondary | ICD-10-CM | POA: Diagnosis not present

## 2019-12-02 DIAGNOSIS — K659 Peritonitis, unspecified: Secondary | ICD-10-CM | POA: Diagnosis not present

## 2019-12-02 DIAGNOSIS — Z992 Dependence on renal dialysis: Secondary | ICD-10-CM | POA: Diagnosis not present

## 2019-12-02 DIAGNOSIS — N186 End stage renal disease: Secondary | ICD-10-CM | POA: Diagnosis not present

## 2019-12-02 DIAGNOSIS — R509 Fever, unspecified: Secondary | ICD-10-CM | POA: Diagnosis not present

## 2019-12-03 DIAGNOSIS — N186 End stage renal disease: Secondary | ICD-10-CM | POA: Diagnosis not present

## 2019-12-03 DIAGNOSIS — Z992 Dependence on renal dialysis: Secondary | ICD-10-CM | POA: Diagnosis not present

## 2019-12-03 DIAGNOSIS — I129 Hypertensive chronic kidney disease with stage 1 through stage 4 chronic kidney disease, or unspecified chronic kidney disease: Secondary | ICD-10-CM | POA: Diagnosis not present

## 2019-12-05 DIAGNOSIS — D631 Anemia in chronic kidney disease: Secondary | ICD-10-CM | POA: Diagnosis not present

## 2019-12-05 DIAGNOSIS — E1129 Type 2 diabetes mellitus with other diabetic kidney complication: Secondary | ICD-10-CM | POA: Diagnosis not present

## 2019-12-05 DIAGNOSIS — Z992 Dependence on renal dialysis: Secondary | ICD-10-CM | POA: Diagnosis not present

## 2019-12-05 DIAGNOSIS — N2581 Secondary hyperparathyroidism of renal origin: Secondary | ICD-10-CM | POA: Diagnosis not present

## 2019-12-05 DIAGNOSIS — N186 End stage renal disease: Secondary | ICD-10-CM | POA: Diagnosis not present

## 2019-12-07 DIAGNOSIS — N186 End stage renal disease: Secondary | ICD-10-CM | POA: Diagnosis not present

## 2019-12-07 DIAGNOSIS — E1129 Type 2 diabetes mellitus with other diabetic kidney complication: Secondary | ICD-10-CM | POA: Diagnosis not present

## 2019-12-07 DIAGNOSIS — D631 Anemia in chronic kidney disease: Secondary | ICD-10-CM | POA: Diagnosis not present

## 2019-12-07 DIAGNOSIS — N2581 Secondary hyperparathyroidism of renal origin: Secondary | ICD-10-CM | POA: Diagnosis not present

## 2019-12-07 DIAGNOSIS — Z992 Dependence on renal dialysis: Secondary | ICD-10-CM | POA: Diagnosis not present

## 2019-12-09 DIAGNOSIS — N186 End stage renal disease: Secondary | ICD-10-CM | POA: Diagnosis not present

## 2019-12-09 DIAGNOSIS — D631 Anemia in chronic kidney disease: Secondary | ICD-10-CM | POA: Diagnosis not present

## 2019-12-09 DIAGNOSIS — Z992 Dependence on renal dialysis: Secondary | ICD-10-CM | POA: Diagnosis not present

## 2019-12-09 DIAGNOSIS — E1129 Type 2 diabetes mellitus with other diabetic kidney complication: Secondary | ICD-10-CM | POA: Diagnosis not present

## 2019-12-09 DIAGNOSIS — N2581 Secondary hyperparathyroidism of renal origin: Secondary | ICD-10-CM | POA: Diagnosis not present

## 2019-12-12 DIAGNOSIS — E1129 Type 2 diabetes mellitus with other diabetic kidney complication: Secondary | ICD-10-CM | POA: Diagnosis not present

## 2019-12-12 DIAGNOSIS — D631 Anemia in chronic kidney disease: Secondary | ICD-10-CM | POA: Diagnosis not present

## 2019-12-12 DIAGNOSIS — N2581 Secondary hyperparathyroidism of renal origin: Secondary | ICD-10-CM | POA: Diagnosis not present

## 2019-12-12 DIAGNOSIS — Z992 Dependence on renal dialysis: Secondary | ICD-10-CM | POA: Diagnosis not present

## 2019-12-12 DIAGNOSIS — N186 End stage renal disease: Secondary | ICD-10-CM | POA: Diagnosis not present

## 2019-12-14 DIAGNOSIS — D631 Anemia in chronic kidney disease: Secondary | ICD-10-CM | POA: Diagnosis not present

## 2019-12-14 DIAGNOSIS — Z992 Dependence on renal dialysis: Secondary | ICD-10-CM | POA: Diagnosis not present

## 2019-12-14 DIAGNOSIS — E1129 Type 2 diabetes mellitus with other diabetic kidney complication: Secondary | ICD-10-CM | POA: Diagnosis not present

## 2019-12-14 DIAGNOSIS — N2581 Secondary hyperparathyroidism of renal origin: Secondary | ICD-10-CM | POA: Diagnosis not present

## 2019-12-14 DIAGNOSIS — N186 End stage renal disease: Secondary | ICD-10-CM | POA: Diagnosis not present

## 2019-12-16 DIAGNOSIS — Z992 Dependence on renal dialysis: Secondary | ICD-10-CM | POA: Diagnosis not present

## 2019-12-16 DIAGNOSIS — E1129 Type 2 diabetes mellitus with other diabetic kidney complication: Secondary | ICD-10-CM | POA: Diagnosis not present

## 2019-12-16 DIAGNOSIS — N186 End stage renal disease: Secondary | ICD-10-CM | POA: Diagnosis not present

## 2019-12-16 DIAGNOSIS — N2581 Secondary hyperparathyroidism of renal origin: Secondary | ICD-10-CM | POA: Diagnosis not present

## 2019-12-16 DIAGNOSIS — D631 Anemia in chronic kidney disease: Secondary | ICD-10-CM | POA: Diagnosis not present

## 2019-12-19 DIAGNOSIS — N2581 Secondary hyperparathyroidism of renal origin: Secondary | ICD-10-CM | POA: Diagnosis not present

## 2019-12-19 DIAGNOSIS — N186 End stage renal disease: Secondary | ICD-10-CM | POA: Diagnosis not present

## 2019-12-19 DIAGNOSIS — D631 Anemia in chronic kidney disease: Secondary | ICD-10-CM | POA: Diagnosis not present

## 2019-12-19 DIAGNOSIS — E1129 Type 2 diabetes mellitus with other diabetic kidney complication: Secondary | ICD-10-CM | POA: Diagnosis not present

## 2019-12-19 DIAGNOSIS — Z992 Dependence on renal dialysis: Secondary | ICD-10-CM | POA: Diagnosis not present

## 2019-12-21 DIAGNOSIS — E1129 Type 2 diabetes mellitus with other diabetic kidney complication: Secondary | ICD-10-CM | POA: Diagnosis not present

## 2019-12-21 DIAGNOSIS — N2581 Secondary hyperparathyroidism of renal origin: Secondary | ICD-10-CM | POA: Diagnosis not present

## 2019-12-21 DIAGNOSIS — Z992 Dependence on renal dialysis: Secondary | ICD-10-CM | POA: Diagnosis not present

## 2019-12-21 DIAGNOSIS — N186 End stage renal disease: Secondary | ICD-10-CM | POA: Diagnosis not present

## 2019-12-21 DIAGNOSIS — D631 Anemia in chronic kidney disease: Secondary | ICD-10-CM | POA: Diagnosis not present

## 2019-12-22 ENCOUNTER — Ambulatory Visit (INDEPENDENT_AMBULATORY_CARE_PROVIDER_SITE_OTHER): Payer: Medicare Other | Admitting: Nurse Practitioner

## 2019-12-22 ENCOUNTER — Ambulatory Visit (INDEPENDENT_AMBULATORY_CARE_PROVIDER_SITE_OTHER): Payer: Medicare Other

## 2019-12-22 ENCOUNTER — Encounter (INDEPENDENT_AMBULATORY_CARE_PROVIDER_SITE_OTHER): Payer: Self-pay | Admitting: Nurse Practitioner

## 2019-12-22 ENCOUNTER — Other Ambulatory Visit: Payer: Self-pay

## 2019-12-22 VITALS — BP 132/83 | HR 102 | Resp 16 | Wt 149.8 lb

## 2019-12-22 DIAGNOSIS — N186 End stage renal disease: Secondary | ICD-10-CM

## 2019-12-22 DIAGNOSIS — Z992 Dependence on renal dialysis: Secondary | ICD-10-CM

## 2019-12-22 DIAGNOSIS — I10 Essential (primary) hypertension: Secondary | ICD-10-CM

## 2019-12-22 NOTE — Progress Notes (Signed)
Subjective:    Patient ID: Ruth Gutierrez, female    DOB: Dec 15, 1965, 54 y.o.   MRN: [id number] Chief Complaint  Patient presents with  . Follow-up    ultrasound follow up    The patient returns to the office for followup of their dialysis access. The function of the access has been stable. The patient denies increased bleeding time or increased recirculation. Patient denies difficulty with cannulation. The patient denies hand pain or other symptoms consistent with steal phenomena.  No significant arm swelling.  The patient is maintained via a left brachial vein transposition.  The patient denies redness or swelling at the access site. The patient denies fever or chills at home or while on dialysis.  The patient denies amaurosis fugax or recent TIA symptoms. There are no recent neurological changes noted. The patient denies claudication symptoms or rest pain symptoms. The patient denies history of DVT, PE or superficial thrombophlebitis. The patient denies recent episodes of angina or shortness of breath.    Today the patient had a flow volume of 1629.  The patient had no areas of hemodynamically significant stenosis.  Previously placed stents are patent.   Review of Systems  All other systems reviewed and are negative.      Objective:   Physical Exam Vitals reviewed.  Cardiovascular:     Rate and Rhythm: Normal rate and regular rhythm.     Pulses: Normal pulses.          Radial pulses are 2+ on the left side.     Arteriovenous access: left arteriovenous access is present.    Comments: Good thrill and Bruit-Left basilic vein transposition  Neurological:     Mental Status: She is alert and oriented to person, place, and time.  Psychiatric:        Mood and Affect: Mood normal.        Behavior: Behavior normal.        Thought Content: Thought content normal.        Judgment: Judgment normal.     BP 132/83 (BP Location: Right Arm)   Pulse (!) 102   Resp 16   Wt  149 lb 12.8 oz (67.9 kg)   LMP 06/02/2015   BMI 23.46 kg/m   Past Medical History:  Diagnosis Date  . Anemia of chronic disease   . Arthritis   . Deceased-donor kidney transplant    Performed at [facility], April 2010.  Initial ESRD due to HTN nephropathy  . Eczema   . ESRD (end stage renal disease) ([place])    s/p transplant creatinine baseline 1.1  M/W/F dialysis  . FUO (fever of unknown origin) 05/17/2015  . GERD (gastroesophageal reflux disease)   . Headache(784.0)   . History of hyperparathyroidism   . Hypertension   . Peritonitis ([place]) 10/2019  . Shortness of breath   . Wears glasses     Social History   Socioeconomic History  . Marital status: Single    Spouse name: Not on file  . Number of children: 1  . Years of education: Not on file  . Highest education level: Not on file  Occupational History  . Occupation: unemployed  Tobacco Use  . Smoking status: Current Every Day Smoker    Packs/day: 0.50    Years: 27.00    Pack years: 13.50    Types: Cigarettes  . Smokeless tobacco: Never Used  . Tobacco comment: 10 cigarettes a day  Substance and Sexual Activity  . Alcohol  use: No    Alcohol/week: 0.0 standard drinks    Comment: occasional drinker noted in the past  . Drug use: No  . Sexual activity: Yes    Partners: Male    Birth control/protection: None  Other Topics Concern  . Not on file  Social History Narrative   Single, 1 daughter   Lives with daughter (born 78) and grandkids   sister helps her with medications etc.   cigarette smoker, rare EtOH, no drugs   Social Determinants of Health   Financial Resource Strain:   . Difficulty of Paying Living Expenses:   Food Insecurity:   . Worried About Charity fundraiser in the Last Year:   . Arboriculturist in the Last Year:   Transportation Needs:   . Film/video editor (Medical):   Marland Kitchen Lack of Transportation (Non-Medical):   Physical Activity:   . Days of Exercise per Week:   . Minutes of  Exercise per Session:   Stress:   . Feeling of Stress :   Social Connections:   . Frequency of Communication with Friends and Family:   . Frequency of Social Gatherings with Friends and Family:   . Attends Religious Services:   . Active Member of Clubs or Organizations:   . Attends Archivist Meetings:   Marland Kitchen Marital Status:   Intimate Partner Violence:   . Fear of Current or Ex-Partner:   . Emotionally Abused:   Marland Kitchen Physically Abused:   . Sexually Abused:     Past Surgical History:  Procedure Laterality Date  . A/V FISTULAGRAM Left 02/12/2017   Procedure: A/V Fistulagram;  Surgeon: Algernon Huxley, MD;  Location: [place] [facility];  Service: Cardiovascular;  Laterality: Left;  . A/V FISTULAGRAM Left 12/30/2017   Procedure: A/V FISTULAGRAM;  Surgeon: Algernon Huxley, MD;  Location: [place] [facility];  Service: Cardiovascular;  Laterality: Left;  . A/V SHUNT INTERVENTION N/A 02/12/2017   Procedure: A/V Shunt Intervention;  Surgeon: Algernon Huxley, MD;  Location: [place] [facility];  Service: Cardiovascular;  Laterality: N/A;  . AV FISTULA PLACEMENT    . BASCILIC VEIN TRANSPOSITION Left 10/30/2014   Procedure: LEFT BASCILIC VEIN TRANSPOSITION;  Surgeon: Rosetta Posner, MD;  Location: [place];  Service: Vascular;  Laterality: Left;  . BASCILIC VEIN TRANSPOSITION Left 01/03/2015   Procedure: LEFT ARM 2ND STAGE BASCILIC VEIN TRANSPOSITION;  Surgeon: Rosetta Posner, MD;  Location: [place];  Service: Vascular;  Laterality: Left;  . CAPD REMOVAL N/A 10/31/2019   Procedure: PERITONEAL DIALYSIS  (CAPD) INFECTED CATHETER REMOVAL;  Surgeon: Coralie Keens, MD;  Location: [place];  Service: General;  Laterality: N/A;  . FRACTURE SURGERY     left foot,baby toe nad next toe missing  . INSERTION OF DIALYSIS CATHETER Right 10/30/2014   Procedure: INSERTION OF DIALYSIS CATHETER;  Surgeon: Rosetta Posner, MD;  Location: [place];  Service: Vascular;  Laterality: Right;  . KIDNEY TRANSPLANT  11/2008   [facility])  . WISDOM TOOTH EXTRACTION      Family History  Problem Relation Age of Onset  . Hypertension Mother   . Hypertension Father   . Diabetes Brother   . Deep vein thrombosis Brother   . Hypertension Sister   . Hyperlipidemia Sister   . Kidney disease Brother        on HD  . Hypertension Sister   . Colon cancer Neg Hx   . Esophageal cancer Neg Hx   .  Rectal cancer Neg Hx     Allergies  Allergen Reactions  . Penicillin G Itching and Rash  . Penicillins Itching and Rash    Did it involve swelling of the face/tongue/throat, SOB, or low BP?Y Did it involve sudden or severe rash/hives, skin peeling, or any reaction on the inside of your mouth or nose? Y Did you need to seek medical attention at a hospital or doctor's office? Y When did it last happen?2016 If all above answers are "NO", may proceed with cephalosporin use.       Assessment & Plan:   1. ESRD on hemodialysis [facility]) Recommend:  The patient is doing well and currently has adequate dialysis access. The patient's dialysis center is not reporting any access issues. Flow pattern is stable when compared to the prior ultrasound.  The patient should have a duplex ultrasound of the dialysis access in 12 months. The patient will follow-up with me in the office after each ultrasound     2. Essential hypertension Continue antihypertensive medications as already ordered, these medications have been reviewed and there are no changes at this time.    Current Outpatient Medications on File Prior to Visit  Medication Sig Dispense Refill  . aspirin (BAYER ASPIRIN EC LOW DOSE) 81 MG EC tablet Take 81 mg by mouth as directed.    . calcitRIOL (ROCALTROL) 0.25 MCG capsule Take 0.25 mcg by mouth daily.    . cinacalcet (SENSIPAR) 90 MG tablet Take 90 mg by mouth daily.     Marland Kitchen omeprazole (PRILOSEC OTC) 20 MG tablet Take 20 mg by mouth daily as needed for heartburn.    . polyethylene glycol (MIRALAX / GLYCOLAX) 17 g  packet Take 17 g by mouth daily. (Patient taking differently: Take 17 g by mouth daily as needed for mild constipation. ) 30 each 0  . potassium chloride SA (KLOR-CON) 20 MEQ tablet Take 2 tablets (40 mEq total) by mouth daily. 60 tablet 0  . sucroferric oxyhydroxide (VELPHORO) 500 MG chewable tablet Chew 1,500 mg by mouth 3 (three) times daily with meals. Taking one tablet with a snack sometimes    . calcitRIOL (ROCALTROL) 0.5 MCG capsule Take 1 capsule (0.5 mcg total) by mouth every Monday, Wednesday, and Friday with hemodialysis. (Patient not taking: Reported on 10/28/2019)     No current facility-administered medications on file prior to visit.    There are no Patient Instructions on file for this visit. No follow-ups on file.   Kris Hartmann, NP

## 2019-12-23 DIAGNOSIS — D631 Anemia in chronic kidney disease: Secondary | ICD-10-CM | POA: Diagnosis not present

## 2019-12-23 DIAGNOSIS — N186 End stage renal disease: Secondary | ICD-10-CM | POA: Diagnosis not present

## 2019-12-23 DIAGNOSIS — N2581 Secondary hyperparathyroidism of renal origin: Secondary | ICD-10-CM | POA: Diagnosis not present

## 2019-12-23 DIAGNOSIS — E1129 Type 2 diabetes mellitus with other diabetic kidney complication: Secondary | ICD-10-CM | POA: Diagnosis not present

## 2019-12-23 DIAGNOSIS — Z992 Dependence on renal dialysis: Secondary | ICD-10-CM | POA: Diagnosis not present

## 2019-12-26 DIAGNOSIS — N186 End stage renal disease: Secondary | ICD-10-CM | POA: Diagnosis not present

## 2019-12-26 DIAGNOSIS — E1129 Type 2 diabetes mellitus with other diabetic kidney complication: Secondary | ICD-10-CM | POA: Diagnosis not present

## 2019-12-26 DIAGNOSIS — N2581 Secondary hyperparathyroidism of renal origin: Secondary | ICD-10-CM | POA: Diagnosis not present

## 2019-12-26 DIAGNOSIS — D631 Anemia in chronic kidney disease: Secondary | ICD-10-CM | POA: Diagnosis not present

## 2019-12-26 DIAGNOSIS — Z992 Dependence on renal dialysis: Secondary | ICD-10-CM | POA: Diagnosis not present

## 2019-12-28 DIAGNOSIS — E1129 Type 2 diabetes mellitus with other diabetic kidney complication: Secondary | ICD-10-CM | POA: Diagnosis not present

## 2019-12-28 DIAGNOSIS — Z992 Dependence on renal dialysis: Secondary | ICD-10-CM | POA: Diagnosis not present

## 2019-12-28 DIAGNOSIS — N2581 Secondary hyperparathyroidism of renal origin: Secondary | ICD-10-CM | POA: Diagnosis not present

## 2019-12-28 DIAGNOSIS — D631 Anemia in chronic kidney disease: Secondary | ICD-10-CM | POA: Diagnosis not present

## 2019-12-28 DIAGNOSIS — N186 End stage renal disease: Secondary | ICD-10-CM | POA: Diagnosis not present

## 2019-12-30 DIAGNOSIS — Z992 Dependence on renal dialysis: Secondary | ICD-10-CM | POA: Diagnosis not present

## 2019-12-30 DIAGNOSIS — D631 Anemia in chronic kidney disease: Secondary | ICD-10-CM | POA: Diagnosis not present

## 2019-12-30 DIAGNOSIS — E1129 Type 2 diabetes mellitus with other diabetic kidney complication: Secondary | ICD-10-CM | POA: Diagnosis not present

## 2019-12-30 DIAGNOSIS — N2581 Secondary hyperparathyroidism of renal origin: Secondary | ICD-10-CM | POA: Diagnosis not present

## 2019-12-30 DIAGNOSIS — N186 End stage renal disease: Secondary | ICD-10-CM | POA: Diagnosis not present

## 2020-01-02 DIAGNOSIS — N186 End stage renal disease: Secondary | ICD-10-CM | POA: Diagnosis not present

## 2020-01-02 DIAGNOSIS — Z992 Dependence on renal dialysis: Secondary | ICD-10-CM | POA: Diagnosis not present

## 2020-01-02 DIAGNOSIS — D631 Anemia in chronic kidney disease: Secondary | ICD-10-CM | POA: Diagnosis not present

## 2020-01-02 DIAGNOSIS — E1129 Type 2 diabetes mellitus with other diabetic kidney complication: Secondary | ICD-10-CM | POA: Diagnosis not present

## 2020-01-02 DIAGNOSIS — N2581 Secondary hyperparathyroidism of renal origin: Secondary | ICD-10-CM | POA: Diagnosis not present

## 2020-02-02 DIAGNOSIS — N186 End stage renal disease: Secondary | ICD-10-CM | POA: Diagnosis not present

## 2020-02-02 DIAGNOSIS — Z992 Dependence on renal dialysis: Secondary | ICD-10-CM | POA: Diagnosis not present

## 2020-02-02 DIAGNOSIS — I129 Hypertensive chronic kidney disease with stage 1 through stage 4 chronic kidney disease, or unspecified chronic kidney disease: Secondary | ICD-10-CM | POA: Diagnosis not present

## 2020-02-03 DIAGNOSIS — N186 End stage renal disease: Secondary | ICD-10-CM | POA: Diagnosis not present

## 2020-02-03 DIAGNOSIS — N2581 Secondary hyperparathyroidism of renal origin: Secondary | ICD-10-CM | POA: Diagnosis not present

## 2020-02-03 DIAGNOSIS — E1129 Type 2 diabetes mellitus with other diabetic kidney complication: Secondary | ICD-10-CM | POA: Diagnosis not present

## 2020-02-03 DIAGNOSIS — Z992 Dependence on renal dialysis: Secondary | ICD-10-CM | POA: Diagnosis not present

## 2020-02-06 DIAGNOSIS — Z992 Dependence on renal dialysis: Secondary | ICD-10-CM | POA: Diagnosis not present

## 2020-02-06 DIAGNOSIS — N186 End stage renal disease: Secondary | ICD-10-CM | POA: Diagnosis not present

## 2020-02-06 DIAGNOSIS — E1129 Type 2 diabetes mellitus with other diabetic kidney complication: Secondary | ICD-10-CM | POA: Diagnosis not present

## 2020-02-06 DIAGNOSIS — N2581 Secondary hyperparathyroidism of renal origin: Secondary | ICD-10-CM | POA: Diagnosis not present

## 2020-02-08 DIAGNOSIS — N2581 Secondary hyperparathyroidism of renal origin: Secondary | ICD-10-CM | POA: Diagnosis not present

## 2020-02-08 DIAGNOSIS — Z992 Dependence on renal dialysis: Secondary | ICD-10-CM | POA: Diagnosis not present

## 2020-02-08 DIAGNOSIS — N186 End stage renal disease: Secondary | ICD-10-CM | POA: Diagnosis not present

## 2020-02-08 DIAGNOSIS — E1129 Type 2 diabetes mellitus with other diabetic kidney complication: Secondary | ICD-10-CM | POA: Diagnosis not present

## 2020-02-10 DIAGNOSIS — N2581 Secondary hyperparathyroidism of renal origin: Secondary | ICD-10-CM | POA: Diagnosis not present

## 2020-02-10 DIAGNOSIS — E1129 Type 2 diabetes mellitus with other diabetic kidney complication: Secondary | ICD-10-CM | POA: Diagnosis not present

## 2020-02-10 DIAGNOSIS — N186 End stage renal disease: Secondary | ICD-10-CM | POA: Diagnosis not present

## 2020-02-10 DIAGNOSIS — Z992 Dependence on renal dialysis: Secondary | ICD-10-CM | POA: Diagnosis not present

## 2020-02-13 DIAGNOSIS — Z992 Dependence on renal dialysis: Secondary | ICD-10-CM | POA: Diagnosis not present

## 2020-02-13 DIAGNOSIS — N186 End stage renal disease: Secondary | ICD-10-CM | POA: Diagnosis not present

## 2020-02-13 DIAGNOSIS — E1129 Type 2 diabetes mellitus with other diabetic kidney complication: Secondary | ICD-10-CM | POA: Diagnosis not present

## 2020-02-13 DIAGNOSIS — N2581 Secondary hyperparathyroidism of renal origin: Secondary | ICD-10-CM | POA: Diagnosis not present

## 2020-02-15 DIAGNOSIS — E1129 Type 2 diabetes mellitus with other diabetic kidney complication: Secondary | ICD-10-CM | POA: Diagnosis not present

## 2020-02-15 DIAGNOSIS — N2581 Secondary hyperparathyroidism of renal origin: Secondary | ICD-10-CM | POA: Diagnosis not present

## 2020-02-15 DIAGNOSIS — N186 End stage renal disease: Secondary | ICD-10-CM | POA: Diagnosis not present

## 2020-02-15 DIAGNOSIS — Z992 Dependence on renal dialysis: Secondary | ICD-10-CM | POA: Diagnosis not present

## 2020-02-17 DIAGNOSIS — E1129 Type 2 diabetes mellitus with other diabetic kidney complication: Secondary | ICD-10-CM | POA: Diagnosis not present

## 2020-02-17 DIAGNOSIS — N186 End stage renal disease: Secondary | ICD-10-CM | POA: Diagnosis not present

## 2020-02-17 DIAGNOSIS — N2581 Secondary hyperparathyroidism of renal origin: Secondary | ICD-10-CM | POA: Diagnosis not present

## 2020-02-17 DIAGNOSIS — Z992 Dependence on renal dialysis: Secondary | ICD-10-CM | POA: Diagnosis not present

## 2020-02-20 DIAGNOSIS — E1129 Type 2 diabetes mellitus with other diabetic kidney complication: Secondary | ICD-10-CM | POA: Diagnosis not present

## 2020-02-20 DIAGNOSIS — N2581 Secondary hyperparathyroidism of renal origin: Secondary | ICD-10-CM | POA: Diagnosis not present

## 2020-02-20 DIAGNOSIS — Z992 Dependence on renal dialysis: Secondary | ICD-10-CM | POA: Diagnosis not present

## 2020-02-20 DIAGNOSIS — N186 End stage renal disease: Secondary | ICD-10-CM | POA: Diagnosis not present

## 2020-02-22 DIAGNOSIS — N186 End stage renal disease: Secondary | ICD-10-CM | POA: Diagnosis not present

## 2020-02-22 DIAGNOSIS — E1129 Type 2 diabetes mellitus with other diabetic kidney complication: Secondary | ICD-10-CM | POA: Diagnosis not present

## 2020-02-22 DIAGNOSIS — N2581 Secondary hyperparathyroidism of renal origin: Secondary | ICD-10-CM | POA: Diagnosis not present

## 2020-02-22 DIAGNOSIS — Z992 Dependence on renal dialysis: Secondary | ICD-10-CM | POA: Diagnosis not present

## 2020-02-24 DIAGNOSIS — E1129 Type 2 diabetes mellitus with other diabetic kidney complication: Secondary | ICD-10-CM | POA: Diagnosis not present

## 2020-02-24 DIAGNOSIS — N186 End stage renal disease: Secondary | ICD-10-CM | POA: Diagnosis not present

## 2020-02-24 DIAGNOSIS — Z992 Dependence on renal dialysis: Secondary | ICD-10-CM | POA: Diagnosis not present

## 2020-02-24 DIAGNOSIS — N2581 Secondary hyperparathyroidism of renal origin: Secondary | ICD-10-CM | POA: Diagnosis not present

## 2020-02-27 DIAGNOSIS — Z992 Dependence on renal dialysis: Secondary | ICD-10-CM | POA: Diagnosis not present

## 2020-02-27 DIAGNOSIS — N186 End stage renal disease: Secondary | ICD-10-CM | POA: Diagnosis not present

## 2020-02-27 DIAGNOSIS — N2581 Secondary hyperparathyroidism of renal origin: Secondary | ICD-10-CM | POA: Diagnosis not present

## 2020-02-27 DIAGNOSIS — E1129 Type 2 diabetes mellitus with other diabetic kidney complication: Secondary | ICD-10-CM | POA: Diagnosis not present

## 2020-02-29 DIAGNOSIS — E1129 Type 2 diabetes mellitus with other diabetic kidney complication: Secondary | ICD-10-CM | POA: Diagnosis not present

## 2020-02-29 DIAGNOSIS — Z992 Dependence on renal dialysis: Secondary | ICD-10-CM | POA: Diagnosis not present

## 2020-02-29 DIAGNOSIS — N2581 Secondary hyperparathyroidism of renal origin: Secondary | ICD-10-CM | POA: Diagnosis not present

## 2020-02-29 DIAGNOSIS — N186 End stage renal disease: Secondary | ICD-10-CM | POA: Diagnosis not present

## 2020-03-02 DIAGNOSIS — Z992 Dependence on renal dialysis: Secondary | ICD-10-CM | POA: Diagnosis not present

## 2020-03-02 DIAGNOSIS — N186 End stage renal disease: Secondary | ICD-10-CM | POA: Diagnosis not present

## 2020-03-02 DIAGNOSIS — E1129 Type 2 diabetes mellitus with other diabetic kidney complication: Secondary | ICD-10-CM | POA: Diagnosis not present

## 2020-03-02 DIAGNOSIS — N2581 Secondary hyperparathyroidism of renal origin: Secondary | ICD-10-CM | POA: Diagnosis not present

## 2020-03-04 DIAGNOSIS — I129 Hypertensive chronic kidney disease with stage 1 through stage 4 chronic kidney disease, or unspecified chronic kidney disease: Secondary | ICD-10-CM | POA: Diagnosis not present

## 2020-03-04 DIAGNOSIS — N186 End stage renal disease: Secondary | ICD-10-CM | POA: Diagnosis not present

## 2020-03-04 DIAGNOSIS — Z992 Dependence on renal dialysis: Secondary | ICD-10-CM | POA: Diagnosis not present

## 2020-03-05 DIAGNOSIS — N186 End stage renal disease: Secondary | ICD-10-CM | POA: Diagnosis not present

## 2020-03-05 DIAGNOSIS — Z992 Dependence on renal dialysis: Secondary | ICD-10-CM | POA: Diagnosis not present

## 2020-03-05 DIAGNOSIS — E1129 Type 2 diabetes mellitus with other diabetic kidney complication: Secondary | ICD-10-CM | POA: Diagnosis not present

## 2020-03-05 DIAGNOSIS — N2581 Secondary hyperparathyroidism of renal origin: Secondary | ICD-10-CM | POA: Diagnosis not present

## 2020-03-07 ENCOUNTER — Telehealth (INDEPENDENT_AMBULATORY_CARE_PROVIDER_SITE_OTHER): Payer: Self-pay | Admitting: Vascular Surgery

## 2020-03-07 ENCOUNTER — Other Ambulatory Visit (INDEPENDENT_AMBULATORY_CARE_PROVIDER_SITE_OTHER): Payer: Self-pay | Admitting: Nurse Practitioner

## 2020-03-07 DIAGNOSIS — E1129 Type 2 diabetes mellitus with other diabetic kidney complication: Secondary | ICD-10-CM | POA: Diagnosis not present

## 2020-03-07 DIAGNOSIS — T829XXS Unspecified complication of cardiac and vascular prosthetic device, implant and graft, sequela: Secondary | ICD-10-CM

## 2020-03-07 DIAGNOSIS — N186 End stage renal disease: Secondary | ICD-10-CM | POA: Diagnosis not present

## 2020-03-07 DIAGNOSIS — Z992 Dependence on renal dialysis: Secondary | ICD-10-CM | POA: Diagnosis not present

## 2020-03-07 DIAGNOSIS — N2581 Secondary hyperparathyroidism of renal origin: Secondary | ICD-10-CM | POA: Diagnosis not present

## 2020-03-07 NOTE — Telephone Encounter (Signed)
Called stating that patient access was bleeding this morning during her treatment. (left upper arm fistula). She stated it took a while for them to get the bleeding to stop, and asked if the patient could be seen soon. Patient was last seen with HDA studies 12-22-19 (FB). I spoke with Elta Guadeloupe and scheduled her to be seen 03/08/20 with HDA studies. This note is for documentation purposes only.

## 2020-03-08 ENCOUNTER — Encounter (INDEPENDENT_AMBULATORY_CARE_PROVIDER_SITE_OTHER): Payer: Self-pay | Admitting: Nurse Practitioner

## 2020-03-08 ENCOUNTER — Other Ambulatory Visit: Payer: Self-pay

## 2020-03-08 ENCOUNTER — Ambulatory Visit (INDEPENDENT_AMBULATORY_CARE_PROVIDER_SITE_OTHER): Payer: Medicare Other | Admitting: Nurse Practitioner

## 2020-03-08 ENCOUNTER — Ambulatory Visit (INDEPENDENT_AMBULATORY_CARE_PROVIDER_SITE_OTHER): Payer: Medicare Other

## 2020-03-08 VITALS — BP 171/112 | HR 90 | Resp 18 | Ht 67.0 in | Wt 158.0 lb

## 2020-03-08 DIAGNOSIS — E1122 Type 2 diabetes mellitus with diabetic chronic kidney disease: Secondary | ICD-10-CM | POA: Diagnosis not present

## 2020-03-08 DIAGNOSIS — Z992 Dependence on renal dialysis: Secondary | ICD-10-CM

## 2020-03-08 DIAGNOSIS — T829XXS Unspecified complication of cardiac and vascular prosthetic device, implant and graft, sequela: Secondary | ICD-10-CM

## 2020-03-08 DIAGNOSIS — N186 End stage renal disease: Secondary | ICD-10-CM | POA: Diagnosis not present

## 2020-03-08 DIAGNOSIS — I10 Essential (primary) hypertension: Secondary | ICD-10-CM

## 2020-03-09 DIAGNOSIS — N186 End stage renal disease: Secondary | ICD-10-CM | POA: Diagnosis not present

## 2020-03-09 DIAGNOSIS — N2581 Secondary hyperparathyroidism of renal origin: Secondary | ICD-10-CM | POA: Diagnosis not present

## 2020-03-09 DIAGNOSIS — E1129 Type 2 diabetes mellitus with other diabetic kidney complication: Secondary | ICD-10-CM | POA: Diagnosis not present

## 2020-03-09 DIAGNOSIS — Z992 Dependence on renal dialysis: Secondary | ICD-10-CM | POA: Diagnosis not present

## 2020-03-12 ENCOUNTER — Encounter (INDEPENDENT_AMBULATORY_CARE_PROVIDER_SITE_OTHER): Payer: Self-pay | Admitting: Nurse Practitioner

## 2020-03-12 DIAGNOSIS — N186 End stage renal disease: Secondary | ICD-10-CM | POA: Diagnosis not present

## 2020-03-12 DIAGNOSIS — N2581 Secondary hyperparathyroidism of renal origin: Secondary | ICD-10-CM | POA: Diagnosis not present

## 2020-03-12 DIAGNOSIS — E1129 Type 2 diabetes mellitus with other diabetic kidney complication: Secondary | ICD-10-CM | POA: Diagnosis not present

## 2020-03-12 DIAGNOSIS — Z992 Dependence on renal dialysis: Secondary | ICD-10-CM | POA: Diagnosis not present

## 2020-03-12 NOTE — Progress Notes (Signed)
Subjective:    Patient ID: Ruth Gutierrez, female    DOB: 1966/05/17, 54 y.o.   MRN: [id number] Chief Complaint  Patient presents with   Follow-up    ultrasound    Patient presents today after having bleeding from her left brachiobasilic transposition following dialysis.  The patient notes that this is the first time this is happened.  The patient notes that there was difficulty gaining control of the bleeding but once the bleeding stopped there was no recurrent bleeding.  The patient does have known aneurysmal areas on her fistula.  Patient has had a previous resection in this arm due to aneurysmal areas before.  She denies any fever, chills, nausea, vomiting or diarrhea.  She denies any hemorrhagic events.  Today the flow volume is 2894.  The previously placed stent is patent.  The brachiobasilic AV fistula has good flow volume with no evidence of significant stenosis.  There are some tortuous and aneurysmal areas in the mid to distal upper arm.   Review of Systems  Hematological: Bruises/bleeds easily.  All other systems reviewed and are negative.      Objective:   Physical Exam Vitals reviewed.  HENT:     Head: Normocephalic.  Cardiovascular:     Rate and Rhythm: Normal rate and regular rhythm.     Pulses: Normal pulses.     Arteriovenous access: left arteriovenous access is present.    Comments: Good thrill and bruit in left brachial basilic transposition Pulmonary:     Effort: Pulmonary effort is normal.  Skin:    General: Skin is warm and dry.     Capillary Refill: Capillary refill takes less than 2 seconds.  Neurological:     Mental Status: She is alert and oriented to person, place, and time.  Psychiatric:        Mood and Affect: Mood normal.        Behavior: Behavior normal.        Thought Content: Thought content normal.        Judgment: Judgment normal.     BP (!) 171/112 (BP Location: Right Arm)    Pulse 90    Resp 18    Ht 5\' 7"  (1.702 m)    Wt 158  lb (71.7 kg)    LMP 06/02/2015    BMI 24.75 kg/m   Past Medical History:  Diagnosis Date   Anemia of chronic disease    Arthritis    Deceased-donor kidney transplant    Performed at [facility], April 2010.  Initial ESRD due to HTN nephropathy   Eczema    ESRD (end stage renal disease) (HCC)    s/p transplant creatinine baseline 1.1  M/W/F dialysis   FUO (fever of unknown origin) 05/17/2015   GERD (gastroesophageal reflux disease)    Headache(784.0)    History of hyperparathyroidism    Hypertension    Peritonitis ([place]) 10/2019   Shortness of breath    Wears glasses     Social History   Socioeconomic History   Marital status: Single    Spouse name: Not on file   Number of children: 1   Years of education: Not on file   Highest education level: Not on file  Occupational History   Occupation: unemployed  Tobacco Use   Smoking status: Current Every Day Smoker    Packs/day: 0.50    Years: 27.00    Pack years: 13.50    Types: Cigarettes   Smokeless tobacco: Never Used  Tobacco comment: 10 cigarettes a day  Vaping Use   Vaping Use: Never used  Substance and Sexual Activity   Alcohol use: No    Alcohol/week: 0.0 standard drinks    Comment: occasional drinker noted in the past   Drug use: No   Sexual activity: Yes    Partners: Male    Birth control/protection: None  Other Topics Concern   Not on file  Social History Narrative   Single, 1 daughter   Lives with daughter (born 13) and grandkids   sister helps her with medications etc.   cigarette smoker, rare EtOH, no drugs   Social Determinants of Radio broadcast assistant Strain:    Difficulty of Paying Living Expenses:   Food Insecurity:    Worried About Charity fundraiser in the Last Year:    Arboriculturist in the Last Year:   Transportation Needs:    Film/video editor (Medical):    Lack of Transportation (Non-Medical):   Physical Activity:    Days of Exercise  per Week:    Minutes of Exercise per Session:   Stress:    Feeling of Stress :   Social Connections:    Frequency of Communication with Friends and Family:    Frequency of Social Gatherings with Friends and Family:    Attends Religious Services:    Active Member of Clubs or Organizations:    Attends Archivist Meetings:    Marital Status:   Intimate Partner Violence:    Fear of Current or Ex-Partner:    Emotionally Abused:    Physically Abused:    Sexually Abused:     Past Surgical History:  Procedure Laterality Date   A/V FISTULAGRAM Left 02/12/2017   Procedure: A/V Fistulagram;  Surgeon: Algernon Huxley, MD;  Location: [place] [facility];  Service: Cardiovascular;  Laterality: Left;   A/V FISTULAGRAM Left 12/30/2017   Procedure: A/V FISTULAGRAM;  Surgeon: Algernon Huxley, MD;  Location: [place] [facility];  Service: Cardiovascular;  Laterality: Left;   A/V SHUNT INTERVENTION N/A 02/12/2017   Procedure: A/V Shunt Intervention;  Surgeon: Algernon Huxley, MD;  Location: [place] [facility];  Service: Cardiovascular;  Laterality: N/A;   AV FISTULA PLACEMENT     BASCILIC VEIN TRANSPOSITION Left 10/30/2014   Procedure: LEFT [place];  Surgeon: Rosetta Posner, MD;  Location: [place];  Service: Vascular;  Laterality: Left;   [place] Left 01/03/2015   Procedure: LEFT ARM 2ND STAGE [place];  Surgeon: Rosetta Posner, MD;  Location: [place];  Service: Vascular;  Laterality: Left;   CAPD REMOVAL N/A 10/31/2019   Procedure: PERITONEAL DIALYSIS  (CAPD) INFECTED CATHETER REMOVAL;  Surgeon: Coralie Keens, MD;  Location: [place];  Service: General;  Laterality: N/A;   FRACTURE SURGERY     left foot,baby toe nad next toe missing   INSERTION OF DIALYSIS CATHETER Right 10/30/2014   Procedure: INSERTION OF DIALYSIS CATHETER;  Surgeon: Rosetta Posner, MD;  Location: [facility] OR;  Service: Vascular;  Laterality: Right;   KIDNEY  TRANSPLANT  11/2008   [facility])   WISDOM TOOTH EXTRACTION      Family History  Problem Relation Age of Onset   Hypertension Mother    Hypertension Father    Diabetes Brother    Deep vein thrombosis Brother    Hypertension Sister    Hyperlipidemia Sister    Kidney disease Brother  on HD   Hypertension Sister    Colon cancer Neg Hx    Esophageal cancer Neg Hx    Rectal cancer Neg Hx     Allergies  Allergen Reactions   Penicillin G Itching and Rash   Penicillins Itching and Rash    Did it involve swelling of the face/tongue/throat, SOB, or low BP?Y Did it involve sudden or severe rash/hives, skin peeling, or any reaction on the inside of your mouth or nose? Y Did you need to seek medical attention at a hospital or doctor's office? Y When did it last happen?2016 If all above answers are NO, may proceed with cephalosporin use.       Assessment & Plan:   1. ESRD on hemodialysis [facility]) Discussion with the patient regarding the bleeding that she is having in her access site.  It is possible that this was a 1 off incident due to how she was stuck however the patient's arm does have some signs of skin threatening and there is concern that this result in further worse bleeding, ulceration or hemorrhage.  I discussed with the patient either placing a PermCath and having a jump graft placed in the current arm that she is utilizing or performing vein mapping in anticipation of new access on the opposite arm.  At this time the patient wishes to think about her options.  Advised the patient to contact her office when she makes a decision however if she decides that she does not wish to proceed at this time we will schedule a 61-month follow-up with a HDA.  Otherwise, the patient should contact her office if she has any other episodes of extended bleeding.  2. Essential hypertension Continue antihypertensive medications as already ordered, these medications  have been reviewed and there are no changes at this time.   3. Type 2 diabetes mellitus with chronic kidney disease on chronic dialysis, without long-term current use of insulin (HCC) Continue hypoglycemic medications as already ordered, these medications have been reviewed and there are no changes at this time.  Hgb A1C to be monitored as already arranged by primary service    Current Outpatient Medications on File Prior to Visit  Medication Sig Dispense Refill   amLODipine (NORVASC) 5 MG tablet Take by mouth.     aspirin (BAYER ASPIRIN EC LOW DOSE) 81 MG EC tablet Take 81 mg by mouth as directed.     calcitRIOL (ROCALTROL) 0.25 MCG capsule Take 0.25 mcg by mouth daily.     cholecalciferol (VITAMIN D3) 25 MCG (1000 UNIT) tablet Take 1,000 Units by mouth daily.     cinacalcet (SENSIPAR) 90 MG tablet Take 90 mg by mouth daily.      lidocaine-prilocaine (EMLA) cream SMARTSIG:Sparingly Topical As Directed     Methoxy PEG-Epoetin Beta (MIRCERA IJ) Mircera     omeprazole (PRILOSEC OTC) 20 MG tablet Take 20 mg by mouth daily as needed for heartburn.     polyethylene glycol (MIRALAX / GLYCOLAX) 17 g packet Take 17 g by mouth daily. (Patient taking differently: Take 17 g by mouth daily as needed for mild constipation. ) 30 each 0   potassium chloride SA (KLOR-CON) 20 MEQ tablet Take 2 tablets (40 mEq total) by mouth daily. 60 tablet 0   sucroferric oxyhydroxide (VELPHORO) 500 MG chewable tablet Chew 1,500 mg by mouth 3 (three) times daily with meals. Taking one tablet with a snack sometimes     Tuberculin PPD (TUBERSOL ID) Inject into the skin.  No current facility-administered medications on file prior to visit.    There are no Patient Instructions on file for this visit. No follow-ups on file.   Kris Hartmann, NP

## 2020-03-14 DIAGNOSIS — Z992 Dependence on renal dialysis: Secondary | ICD-10-CM | POA: Diagnosis not present

## 2020-03-14 DIAGNOSIS — E1129 Type 2 diabetes mellitus with other diabetic kidney complication: Secondary | ICD-10-CM | POA: Diagnosis not present

## 2020-03-14 DIAGNOSIS — N186 End stage renal disease: Secondary | ICD-10-CM | POA: Diagnosis not present

## 2020-03-14 DIAGNOSIS — N2581 Secondary hyperparathyroidism of renal origin: Secondary | ICD-10-CM | POA: Diagnosis not present

## 2020-03-16 DIAGNOSIS — N186 End stage renal disease: Secondary | ICD-10-CM | POA: Diagnosis not present

## 2020-03-16 DIAGNOSIS — N2581 Secondary hyperparathyroidism of renal origin: Secondary | ICD-10-CM | POA: Diagnosis not present

## 2020-03-16 DIAGNOSIS — Z992 Dependence on renal dialysis: Secondary | ICD-10-CM | POA: Diagnosis not present

## 2020-03-16 DIAGNOSIS — E1129 Type 2 diabetes mellitus with other diabetic kidney complication: Secondary | ICD-10-CM | POA: Diagnosis not present

## 2020-03-19 DIAGNOSIS — E1129 Type 2 diabetes mellitus with other diabetic kidney complication: Secondary | ICD-10-CM | POA: Diagnosis not present

## 2020-03-19 DIAGNOSIS — N2581 Secondary hyperparathyroidism of renal origin: Secondary | ICD-10-CM | POA: Diagnosis not present

## 2020-03-19 DIAGNOSIS — N186 End stage renal disease: Secondary | ICD-10-CM | POA: Diagnosis not present

## 2020-03-19 DIAGNOSIS — Z992 Dependence on renal dialysis: Secondary | ICD-10-CM | POA: Diagnosis not present

## 2020-03-21 DIAGNOSIS — E1129 Type 2 diabetes mellitus with other diabetic kidney complication: Secondary | ICD-10-CM | POA: Diagnosis not present

## 2020-03-21 DIAGNOSIS — Z992 Dependence on renal dialysis: Secondary | ICD-10-CM | POA: Diagnosis not present

## 2020-03-21 DIAGNOSIS — N186 End stage renal disease: Secondary | ICD-10-CM | POA: Diagnosis not present

## 2020-03-21 DIAGNOSIS — N2581 Secondary hyperparathyroidism of renal origin: Secondary | ICD-10-CM | POA: Diagnosis not present

## 2020-03-23 ENCOUNTER — Other Ambulatory Visit (INDEPENDENT_AMBULATORY_CARE_PROVIDER_SITE_OTHER): Payer: Self-pay | Admitting: Nurse Practitioner

## 2020-03-23 DIAGNOSIS — E1129 Type 2 diabetes mellitus with other diabetic kidney complication: Secondary | ICD-10-CM | POA: Diagnosis not present

## 2020-03-23 DIAGNOSIS — N186 End stage renal disease: Secondary | ICD-10-CM | POA: Diagnosis not present

## 2020-03-23 DIAGNOSIS — N2581 Secondary hyperparathyroidism of renal origin: Secondary | ICD-10-CM | POA: Diagnosis not present

## 2020-03-23 DIAGNOSIS — Z992 Dependence on renal dialysis: Secondary | ICD-10-CM | POA: Diagnosis not present

## 2020-03-26 DIAGNOSIS — N186 End stage renal disease: Secondary | ICD-10-CM | POA: Diagnosis not present

## 2020-03-26 DIAGNOSIS — N2581 Secondary hyperparathyroidism of renal origin: Secondary | ICD-10-CM | POA: Diagnosis not present

## 2020-03-26 DIAGNOSIS — Z992 Dependence on renal dialysis: Secondary | ICD-10-CM | POA: Diagnosis not present

## 2020-03-26 DIAGNOSIS — E1129 Type 2 diabetes mellitus with other diabetic kidney complication: Secondary | ICD-10-CM | POA: Diagnosis not present

## 2020-03-28 DIAGNOSIS — E1129 Type 2 diabetes mellitus with other diabetic kidney complication: Secondary | ICD-10-CM | POA: Diagnosis not present

## 2020-03-28 DIAGNOSIS — N2581 Secondary hyperparathyroidism of renal origin: Secondary | ICD-10-CM | POA: Diagnosis not present

## 2020-03-28 DIAGNOSIS — N186 End stage renal disease: Secondary | ICD-10-CM | POA: Diagnosis not present

## 2020-03-28 DIAGNOSIS — Z992 Dependence on renal dialysis: Secondary | ICD-10-CM | POA: Diagnosis not present

## 2020-03-30 DIAGNOSIS — N186 End stage renal disease: Secondary | ICD-10-CM | POA: Diagnosis not present

## 2020-03-30 DIAGNOSIS — Z992 Dependence on renal dialysis: Secondary | ICD-10-CM | POA: Diagnosis not present

## 2020-03-30 DIAGNOSIS — E1129 Type 2 diabetes mellitus with other diabetic kidney complication: Secondary | ICD-10-CM | POA: Diagnosis not present

## 2020-03-30 DIAGNOSIS — N2581 Secondary hyperparathyroidism of renal origin: Secondary | ICD-10-CM | POA: Diagnosis not present

## 2020-04-02 DIAGNOSIS — N186 End stage renal disease: Secondary | ICD-10-CM | POA: Diagnosis not present

## 2020-04-02 DIAGNOSIS — E1129 Type 2 diabetes mellitus with other diabetic kidney complication: Secondary | ICD-10-CM | POA: Diagnosis not present

## 2020-04-02 DIAGNOSIS — N2581 Secondary hyperparathyroidism of renal origin: Secondary | ICD-10-CM | POA: Diagnosis not present

## 2020-04-02 DIAGNOSIS — Z992 Dependence on renal dialysis: Secondary | ICD-10-CM | POA: Diagnosis not present

## 2020-04-04 DIAGNOSIS — N186 End stage renal disease: Secondary | ICD-10-CM | POA: Diagnosis not present

## 2020-04-04 DIAGNOSIS — I129 Hypertensive chronic kidney disease with stage 1 through stage 4 chronic kidney disease, or unspecified chronic kidney disease: Secondary | ICD-10-CM | POA: Diagnosis not present

## 2020-04-04 DIAGNOSIS — N2581 Secondary hyperparathyroidism of renal origin: Secondary | ICD-10-CM | POA: Diagnosis not present

## 2020-04-04 DIAGNOSIS — E1129 Type 2 diabetes mellitus with other diabetic kidney complication: Secondary | ICD-10-CM | POA: Diagnosis not present

## 2020-04-04 DIAGNOSIS — D631 Anemia in chronic kidney disease: Secondary | ICD-10-CM | POA: Diagnosis not present

## 2020-04-04 DIAGNOSIS — Z992 Dependence on renal dialysis: Secondary | ICD-10-CM | POA: Diagnosis not present

## 2020-04-06 DIAGNOSIS — D631 Anemia in chronic kidney disease: Secondary | ICD-10-CM | POA: Diagnosis not present

## 2020-04-06 DIAGNOSIS — E1129 Type 2 diabetes mellitus with other diabetic kidney complication: Secondary | ICD-10-CM | POA: Diagnosis not present

## 2020-04-06 DIAGNOSIS — N2581 Secondary hyperparathyroidism of renal origin: Secondary | ICD-10-CM | POA: Diagnosis not present

## 2020-04-06 DIAGNOSIS — N186 End stage renal disease: Secondary | ICD-10-CM | POA: Diagnosis not present

## 2020-04-06 DIAGNOSIS — Z992 Dependence on renal dialysis: Secondary | ICD-10-CM | POA: Diagnosis not present

## 2020-04-09 DIAGNOSIS — E1129 Type 2 diabetes mellitus with other diabetic kidney complication: Secondary | ICD-10-CM | POA: Diagnosis not present

## 2020-04-09 DIAGNOSIS — Z992 Dependence on renal dialysis: Secondary | ICD-10-CM | POA: Diagnosis not present

## 2020-04-09 DIAGNOSIS — N186 End stage renal disease: Secondary | ICD-10-CM | POA: Diagnosis not present

## 2020-04-09 DIAGNOSIS — D631 Anemia in chronic kidney disease: Secondary | ICD-10-CM | POA: Diagnosis not present

## 2020-04-09 DIAGNOSIS — N2581 Secondary hyperparathyroidism of renal origin: Secondary | ICD-10-CM | POA: Diagnosis not present

## 2020-04-11 DIAGNOSIS — Z992 Dependence on renal dialysis: Secondary | ICD-10-CM | POA: Diagnosis not present

## 2020-04-11 DIAGNOSIS — D631 Anemia in chronic kidney disease: Secondary | ICD-10-CM | POA: Diagnosis not present

## 2020-04-11 DIAGNOSIS — E1129 Type 2 diabetes mellitus with other diabetic kidney complication: Secondary | ICD-10-CM | POA: Diagnosis not present

## 2020-04-11 DIAGNOSIS — N186 End stage renal disease: Secondary | ICD-10-CM | POA: Diagnosis not present

## 2020-04-11 DIAGNOSIS — N2581 Secondary hyperparathyroidism of renal origin: Secondary | ICD-10-CM | POA: Diagnosis not present

## 2020-04-13 DIAGNOSIS — N2581 Secondary hyperparathyroidism of renal origin: Secondary | ICD-10-CM | POA: Diagnosis not present

## 2020-04-13 DIAGNOSIS — Z992 Dependence on renal dialysis: Secondary | ICD-10-CM | POA: Diagnosis not present

## 2020-04-13 DIAGNOSIS — E1129 Type 2 diabetes mellitus with other diabetic kidney complication: Secondary | ICD-10-CM | POA: Diagnosis not present

## 2020-04-13 DIAGNOSIS — D631 Anemia in chronic kidney disease: Secondary | ICD-10-CM | POA: Diagnosis not present

## 2020-04-13 DIAGNOSIS — N186 End stage renal disease: Secondary | ICD-10-CM | POA: Diagnosis not present

## 2020-04-16 DIAGNOSIS — D631 Anemia in chronic kidney disease: Secondary | ICD-10-CM | POA: Diagnosis not present

## 2020-04-16 DIAGNOSIS — Z992 Dependence on renal dialysis: Secondary | ICD-10-CM | POA: Diagnosis not present

## 2020-04-16 DIAGNOSIS — E1129 Type 2 diabetes mellitus with other diabetic kidney complication: Secondary | ICD-10-CM | POA: Diagnosis not present

## 2020-04-16 DIAGNOSIS — N186 End stage renal disease: Secondary | ICD-10-CM | POA: Diagnosis not present

## 2020-04-16 DIAGNOSIS — N2581 Secondary hyperparathyroidism of renal origin: Secondary | ICD-10-CM | POA: Diagnosis not present

## 2020-04-18 DIAGNOSIS — D631 Anemia in chronic kidney disease: Secondary | ICD-10-CM | POA: Diagnosis not present

## 2020-04-18 DIAGNOSIS — N2581 Secondary hyperparathyroidism of renal origin: Secondary | ICD-10-CM | POA: Diagnosis not present

## 2020-04-18 DIAGNOSIS — Z992 Dependence on renal dialysis: Secondary | ICD-10-CM | POA: Diagnosis not present

## 2020-04-18 DIAGNOSIS — E1129 Type 2 diabetes mellitus with other diabetic kidney complication: Secondary | ICD-10-CM | POA: Diagnosis not present

## 2020-04-18 DIAGNOSIS — N186 End stage renal disease: Secondary | ICD-10-CM | POA: Diagnosis not present

## 2020-04-20 DIAGNOSIS — D631 Anemia in chronic kidney disease: Secondary | ICD-10-CM | POA: Diagnosis not present

## 2020-04-20 DIAGNOSIS — N2581 Secondary hyperparathyroidism of renal origin: Secondary | ICD-10-CM | POA: Diagnosis not present

## 2020-04-20 DIAGNOSIS — N186 End stage renal disease: Secondary | ICD-10-CM | POA: Diagnosis not present

## 2020-04-20 DIAGNOSIS — E1129 Type 2 diabetes mellitus with other diabetic kidney complication: Secondary | ICD-10-CM | POA: Diagnosis not present

## 2020-04-20 DIAGNOSIS — Z992 Dependence on renal dialysis: Secondary | ICD-10-CM | POA: Diagnosis not present

## 2020-04-23 DIAGNOSIS — D631 Anemia in chronic kidney disease: Secondary | ICD-10-CM | POA: Diagnosis not present

## 2020-04-23 DIAGNOSIS — E1129 Type 2 diabetes mellitus with other diabetic kidney complication: Secondary | ICD-10-CM | POA: Diagnosis not present

## 2020-04-23 DIAGNOSIS — Z992 Dependence on renal dialysis: Secondary | ICD-10-CM | POA: Diagnosis not present

## 2020-04-23 DIAGNOSIS — N186 End stage renal disease: Secondary | ICD-10-CM | POA: Diagnosis not present

## 2020-04-23 DIAGNOSIS — N2581 Secondary hyperparathyroidism of renal origin: Secondary | ICD-10-CM | POA: Diagnosis not present

## 2020-04-25 DIAGNOSIS — N2581 Secondary hyperparathyroidism of renal origin: Secondary | ICD-10-CM | POA: Diagnosis not present

## 2020-04-25 DIAGNOSIS — E1129 Type 2 diabetes mellitus with other diabetic kidney complication: Secondary | ICD-10-CM | POA: Diagnosis not present

## 2020-04-25 DIAGNOSIS — D631 Anemia in chronic kidney disease: Secondary | ICD-10-CM | POA: Diagnosis not present

## 2020-04-25 DIAGNOSIS — Z992 Dependence on renal dialysis: Secondary | ICD-10-CM | POA: Diagnosis not present

## 2020-04-25 DIAGNOSIS — N186 End stage renal disease: Secondary | ICD-10-CM | POA: Diagnosis not present

## 2020-04-27 DIAGNOSIS — N2581 Secondary hyperparathyroidism of renal origin: Secondary | ICD-10-CM | POA: Diagnosis not present

## 2020-04-27 DIAGNOSIS — N186 End stage renal disease: Secondary | ICD-10-CM | POA: Diagnosis not present

## 2020-04-27 DIAGNOSIS — E1129 Type 2 diabetes mellitus with other diabetic kidney complication: Secondary | ICD-10-CM | POA: Diagnosis not present

## 2020-04-27 DIAGNOSIS — Z992 Dependence on renal dialysis: Secondary | ICD-10-CM | POA: Diagnosis not present

## 2020-04-27 DIAGNOSIS — D631 Anemia in chronic kidney disease: Secondary | ICD-10-CM | POA: Diagnosis not present

## 2020-04-30 DIAGNOSIS — D631 Anemia in chronic kidney disease: Secondary | ICD-10-CM | POA: Diagnosis not present

## 2020-04-30 DIAGNOSIS — E1129 Type 2 diabetes mellitus with other diabetic kidney complication: Secondary | ICD-10-CM | POA: Diagnosis not present

## 2020-04-30 DIAGNOSIS — N2581 Secondary hyperparathyroidism of renal origin: Secondary | ICD-10-CM | POA: Diagnosis not present

## 2020-04-30 DIAGNOSIS — Z992 Dependence on renal dialysis: Secondary | ICD-10-CM | POA: Diagnosis not present

## 2020-04-30 DIAGNOSIS — N186 End stage renal disease: Secondary | ICD-10-CM | POA: Diagnosis not present

## 2020-05-02 DIAGNOSIS — E1129 Type 2 diabetes mellitus with other diabetic kidney complication: Secondary | ICD-10-CM | POA: Diagnosis not present

## 2020-05-02 DIAGNOSIS — D631 Anemia in chronic kidney disease: Secondary | ICD-10-CM | POA: Diagnosis not present

## 2020-05-02 DIAGNOSIS — N186 End stage renal disease: Secondary | ICD-10-CM | POA: Diagnosis not present

## 2020-05-02 DIAGNOSIS — Z992 Dependence on renal dialysis: Secondary | ICD-10-CM | POA: Diagnosis not present

## 2020-05-02 DIAGNOSIS — N2581 Secondary hyperparathyroidism of renal origin: Secondary | ICD-10-CM | POA: Diagnosis not present

## 2020-05-04 DIAGNOSIS — Z992 Dependence on renal dialysis: Secondary | ICD-10-CM | POA: Diagnosis not present

## 2020-05-04 DIAGNOSIS — I129 Hypertensive chronic kidney disease with stage 1 through stage 4 chronic kidney disease, or unspecified chronic kidney disease: Secondary | ICD-10-CM | POA: Diagnosis not present

## 2020-05-04 DIAGNOSIS — E1129 Type 2 diabetes mellitus with other diabetic kidney complication: Secondary | ICD-10-CM | POA: Diagnosis not present

## 2020-05-04 DIAGNOSIS — N2581 Secondary hyperparathyroidism of renal origin: Secondary | ICD-10-CM | POA: Diagnosis not present

## 2020-05-04 DIAGNOSIS — N186 End stage renal disease: Secondary | ICD-10-CM | POA: Diagnosis not present

## 2020-05-04 DIAGNOSIS — Z23 Encounter for immunization: Secondary | ICD-10-CM | POA: Diagnosis not present

## 2020-05-07 DIAGNOSIS — N2581 Secondary hyperparathyroidism of renal origin: Secondary | ICD-10-CM | POA: Diagnosis not present

## 2020-05-07 DIAGNOSIS — Z992 Dependence on renal dialysis: Secondary | ICD-10-CM | POA: Diagnosis not present

## 2020-05-07 DIAGNOSIS — N186 End stage renal disease: Secondary | ICD-10-CM | POA: Diagnosis not present

## 2020-05-07 DIAGNOSIS — Z23 Encounter for immunization: Secondary | ICD-10-CM | POA: Diagnosis not present

## 2020-05-07 DIAGNOSIS — E1129 Type 2 diabetes mellitus with other diabetic kidney complication: Secondary | ICD-10-CM | POA: Diagnosis not present

## 2020-05-09 DIAGNOSIS — N2581 Secondary hyperparathyroidism of renal origin: Secondary | ICD-10-CM | POA: Diagnosis not present

## 2020-05-09 DIAGNOSIS — Z992 Dependence on renal dialysis: Secondary | ICD-10-CM | POA: Diagnosis not present

## 2020-05-09 DIAGNOSIS — E1129 Type 2 diabetes mellitus with other diabetic kidney complication: Secondary | ICD-10-CM | POA: Diagnosis not present

## 2020-05-09 DIAGNOSIS — N186 End stage renal disease: Secondary | ICD-10-CM | POA: Diagnosis not present

## 2020-05-09 DIAGNOSIS — Z23 Encounter for immunization: Secondary | ICD-10-CM | POA: Diagnosis not present

## 2020-05-11 DIAGNOSIS — N186 End stage renal disease: Secondary | ICD-10-CM | POA: Diagnosis not present

## 2020-05-11 DIAGNOSIS — E1129 Type 2 diabetes mellitus with other diabetic kidney complication: Secondary | ICD-10-CM | POA: Diagnosis not present

## 2020-05-11 DIAGNOSIS — N2581 Secondary hyperparathyroidism of renal origin: Secondary | ICD-10-CM | POA: Diagnosis not present

## 2020-05-11 DIAGNOSIS — Z23 Encounter for immunization: Secondary | ICD-10-CM | POA: Diagnosis not present

## 2020-05-11 DIAGNOSIS — Z992 Dependence on renal dialysis: Secondary | ICD-10-CM | POA: Diagnosis not present

## 2020-05-14 DIAGNOSIS — E1129 Type 2 diabetes mellitus with other diabetic kidney complication: Secondary | ICD-10-CM | POA: Diagnosis not present

## 2020-05-14 DIAGNOSIS — N2581 Secondary hyperparathyroidism of renal origin: Secondary | ICD-10-CM | POA: Diagnosis not present

## 2020-05-14 DIAGNOSIS — Z23 Encounter for immunization: Secondary | ICD-10-CM | POA: Diagnosis not present

## 2020-05-14 DIAGNOSIS — N186 End stage renal disease: Secondary | ICD-10-CM | POA: Diagnosis not present

## 2020-05-14 DIAGNOSIS — Z992 Dependence on renal dialysis: Secondary | ICD-10-CM | POA: Diagnosis not present

## 2020-05-16 DIAGNOSIS — N2581 Secondary hyperparathyroidism of renal origin: Secondary | ICD-10-CM | POA: Diagnosis not present

## 2020-05-16 DIAGNOSIS — Z992 Dependence on renal dialysis: Secondary | ICD-10-CM | POA: Diagnosis not present

## 2020-05-16 DIAGNOSIS — Z23 Encounter for immunization: Secondary | ICD-10-CM | POA: Diagnosis not present

## 2020-05-16 DIAGNOSIS — E1129 Type 2 diabetes mellitus with other diabetic kidney complication: Secondary | ICD-10-CM | POA: Diagnosis not present

## 2020-05-16 DIAGNOSIS — N186 End stage renal disease: Secondary | ICD-10-CM | POA: Diagnosis not present

## 2020-05-18 DIAGNOSIS — N186 End stage renal disease: Secondary | ICD-10-CM | POA: Diagnosis not present

## 2020-05-18 DIAGNOSIS — N2581 Secondary hyperparathyroidism of renal origin: Secondary | ICD-10-CM | POA: Diagnosis not present

## 2020-05-18 DIAGNOSIS — Z23 Encounter for immunization: Secondary | ICD-10-CM | POA: Diagnosis not present

## 2020-05-18 DIAGNOSIS — Z992 Dependence on renal dialysis: Secondary | ICD-10-CM | POA: Diagnosis not present

## 2020-05-18 DIAGNOSIS — E1129 Type 2 diabetes mellitus with other diabetic kidney complication: Secondary | ICD-10-CM | POA: Diagnosis not present

## 2020-05-21 DIAGNOSIS — Z23 Encounter for immunization: Secondary | ICD-10-CM | POA: Diagnosis not present

## 2020-05-21 DIAGNOSIS — N2581 Secondary hyperparathyroidism of renal origin: Secondary | ICD-10-CM | POA: Diagnosis not present

## 2020-05-21 DIAGNOSIS — E1129 Type 2 diabetes mellitus with other diabetic kidney complication: Secondary | ICD-10-CM | POA: Diagnosis not present

## 2020-05-21 DIAGNOSIS — Z992 Dependence on renal dialysis: Secondary | ICD-10-CM | POA: Diagnosis not present

## 2020-05-21 DIAGNOSIS — N186 End stage renal disease: Secondary | ICD-10-CM | POA: Diagnosis not present

## 2020-05-23 DIAGNOSIS — Z23 Encounter for immunization: Secondary | ICD-10-CM | POA: Diagnosis not present

## 2020-05-23 DIAGNOSIS — N186 End stage renal disease: Secondary | ICD-10-CM | POA: Diagnosis not present

## 2020-05-23 DIAGNOSIS — Z992 Dependence on renal dialysis: Secondary | ICD-10-CM | POA: Diagnosis not present

## 2020-05-23 DIAGNOSIS — E1129 Type 2 diabetes mellitus with other diabetic kidney complication: Secondary | ICD-10-CM | POA: Diagnosis not present

## 2020-05-23 DIAGNOSIS — N2581 Secondary hyperparathyroidism of renal origin: Secondary | ICD-10-CM | POA: Diagnosis not present

## 2020-05-25 DIAGNOSIS — Z992 Dependence on renal dialysis: Secondary | ICD-10-CM | POA: Diagnosis not present

## 2020-05-25 DIAGNOSIS — N2581 Secondary hyperparathyroidism of renal origin: Secondary | ICD-10-CM | POA: Diagnosis not present

## 2020-05-25 DIAGNOSIS — Z23 Encounter for immunization: Secondary | ICD-10-CM | POA: Diagnosis not present

## 2020-05-25 DIAGNOSIS — E1129 Type 2 diabetes mellitus with other diabetic kidney complication: Secondary | ICD-10-CM | POA: Diagnosis not present

## 2020-05-25 DIAGNOSIS — N186 End stage renal disease: Secondary | ICD-10-CM | POA: Diagnosis not present

## 2020-05-28 DIAGNOSIS — Z992 Dependence on renal dialysis: Secondary | ICD-10-CM | POA: Diagnosis not present

## 2020-05-28 DIAGNOSIS — N2581 Secondary hyperparathyroidism of renal origin: Secondary | ICD-10-CM | POA: Diagnosis not present

## 2020-05-28 DIAGNOSIS — N186 End stage renal disease: Secondary | ICD-10-CM | POA: Diagnosis not present

## 2020-05-28 DIAGNOSIS — E1129 Type 2 diabetes mellitus with other diabetic kidney complication: Secondary | ICD-10-CM | POA: Diagnosis not present

## 2020-05-28 DIAGNOSIS — Z23 Encounter for immunization: Secondary | ICD-10-CM | POA: Diagnosis not present

## 2020-05-30 DIAGNOSIS — N186 End stage renal disease: Secondary | ICD-10-CM | POA: Diagnosis not present

## 2020-05-30 DIAGNOSIS — N2581 Secondary hyperparathyroidism of renal origin: Secondary | ICD-10-CM | POA: Diagnosis not present

## 2020-05-30 DIAGNOSIS — Z23 Encounter for immunization: Secondary | ICD-10-CM | POA: Diagnosis not present

## 2020-05-30 DIAGNOSIS — E1129 Type 2 diabetes mellitus with other diabetic kidney complication: Secondary | ICD-10-CM | POA: Diagnosis not present

## 2020-05-30 DIAGNOSIS — Z992 Dependence on renal dialysis: Secondary | ICD-10-CM | POA: Diagnosis not present

## 2020-06-01 DIAGNOSIS — E1129 Type 2 diabetes mellitus with other diabetic kidney complication: Secondary | ICD-10-CM | POA: Diagnosis not present

## 2020-06-01 DIAGNOSIS — Z23 Encounter for immunization: Secondary | ICD-10-CM | POA: Diagnosis not present

## 2020-06-01 DIAGNOSIS — N2581 Secondary hyperparathyroidism of renal origin: Secondary | ICD-10-CM | POA: Diagnosis not present

## 2020-06-01 DIAGNOSIS — N186 End stage renal disease: Secondary | ICD-10-CM | POA: Diagnosis not present

## 2020-06-01 DIAGNOSIS — Z992 Dependence on renal dialysis: Secondary | ICD-10-CM | POA: Diagnosis not present

## 2020-06-04 ENCOUNTER — Other Ambulatory Visit (INDEPENDENT_AMBULATORY_CARE_PROVIDER_SITE_OTHER): Payer: Self-pay | Admitting: Nurse Practitioner

## 2020-06-04 DIAGNOSIS — I129 Hypertensive chronic kidney disease with stage 1 through stage 4 chronic kidney disease, or unspecified chronic kidney disease: Secondary | ICD-10-CM | POA: Diagnosis not present

## 2020-06-04 DIAGNOSIS — Z992 Dependence on renal dialysis: Secondary | ICD-10-CM | POA: Diagnosis not present

## 2020-06-04 DIAGNOSIS — N2581 Secondary hyperparathyroidism of renal origin: Secondary | ICD-10-CM | POA: Diagnosis not present

## 2020-06-04 DIAGNOSIS — N186 End stage renal disease: Secondary | ICD-10-CM

## 2020-06-04 DIAGNOSIS — E1129 Type 2 diabetes mellitus with other diabetic kidney complication: Secondary | ICD-10-CM | POA: Diagnosis not present

## 2020-06-05 ENCOUNTER — Encounter (INDEPENDENT_AMBULATORY_CARE_PROVIDER_SITE_OTHER): Payer: Medicare Other

## 2020-06-05 ENCOUNTER — Ambulatory Visit (INDEPENDENT_AMBULATORY_CARE_PROVIDER_SITE_OTHER): Payer: Medicare Other | Admitting: Vascular Surgery

## 2020-06-06 DIAGNOSIS — N2581 Secondary hyperparathyroidism of renal origin: Secondary | ICD-10-CM | POA: Diagnosis not present

## 2020-06-06 DIAGNOSIS — E1129 Type 2 diabetes mellitus with other diabetic kidney complication: Secondary | ICD-10-CM | POA: Diagnosis not present

## 2020-06-06 DIAGNOSIS — N186 End stage renal disease: Secondary | ICD-10-CM | POA: Diagnosis not present

## 2020-06-06 DIAGNOSIS — Z992 Dependence on renal dialysis: Secondary | ICD-10-CM | POA: Diagnosis not present

## 2020-06-08 DIAGNOSIS — N186 End stage renal disease: Secondary | ICD-10-CM | POA: Diagnosis not present

## 2020-06-08 DIAGNOSIS — E1129 Type 2 diabetes mellitus with other diabetic kidney complication: Secondary | ICD-10-CM | POA: Diagnosis not present

## 2020-06-08 DIAGNOSIS — N2581 Secondary hyperparathyroidism of renal origin: Secondary | ICD-10-CM | POA: Diagnosis not present

## 2020-06-08 DIAGNOSIS — Z992 Dependence on renal dialysis: Secondary | ICD-10-CM | POA: Diagnosis not present

## 2020-06-11 DIAGNOSIS — N186 End stage renal disease: Secondary | ICD-10-CM | POA: Diagnosis not present

## 2020-06-11 DIAGNOSIS — Z992 Dependence on renal dialysis: Secondary | ICD-10-CM | POA: Diagnosis not present

## 2020-06-11 DIAGNOSIS — E1129 Type 2 diabetes mellitus with other diabetic kidney complication: Secondary | ICD-10-CM | POA: Diagnosis not present

## 2020-06-11 DIAGNOSIS — N2581 Secondary hyperparathyroidism of renal origin: Secondary | ICD-10-CM | POA: Diagnosis not present

## 2020-06-13 DIAGNOSIS — N186 End stage renal disease: Secondary | ICD-10-CM | POA: Diagnosis not present

## 2020-06-13 DIAGNOSIS — N2581 Secondary hyperparathyroidism of renal origin: Secondary | ICD-10-CM | POA: Diagnosis not present

## 2020-06-13 DIAGNOSIS — E1129 Type 2 diabetes mellitus with other diabetic kidney complication: Secondary | ICD-10-CM | POA: Diagnosis not present

## 2020-06-13 DIAGNOSIS — Z992 Dependence on renal dialysis: Secondary | ICD-10-CM | POA: Diagnosis not present

## 2020-06-15 DIAGNOSIS — E1129 Type 2 diabetes mellitus with other diabetic kidney complication: Secondary | ICD-10-CM | POA: Diagnosis not present

## 2020-06-15 DIAGNOSIS — N2581 Secondary hyperparathyroidism of renal origin: Secondary | ICD-10-CM | POA: Diagnosis not present

## 2020-06-15 DIAGNOSIS — N186 End stage renal disease: Secondary | ICD-10-CM | POA: Diagnosis not present

## 2020-06-15 DIAGNOSIS — Z992 Dependence on renal dialysis: Secondary | ICD-10-CM | POA: Diagnosis not present

## 2020-06-18 DIAGNOSIS — Z992 Dependence on renal dialysis: Secondary | ICD-10-CM | POA: Diagnosis not present

## 2020-06-18 DIAGNOSIS — N186 End stage renal disease: Secondary | ICD-10-CM | POA: Diagnosis not present

## 2020-06-18 DIAGNOSIS — N2581 Secondary hyperparathyroidism of renal origin: Secondary | ICD-10-CM | POA: Diagnosis not present

## 2020-06-18 DIAGNOSIS — E1129 Type 2 diabetes mellitus with other diabetic kidney complication: Secondary | ICD-10-CM | POA: Diagnosis not present

## 2020-06-20 DIAGNOSIS — Z992 Dependence on renal dialysis: Secondary | ICD-10-CM | POA: Diagnosis not present

## 2020-06-20 DIAGNOSIS — N186 End stage renal disease: Secondary | ICD-10-CM | POA: Diagnosis not present

## 2020-06-20 DIAGNOSIS — N2581 Secondary hyperparathyroidism of renal origin: Secondary | ICD-10-CM | POA: Diagnosis not present

## 2020-06-20 DIAGNOSIS — E1129 Type 2 diabetes mellitus with other diabetic kidney complication: Secondary | ICD-10-CM | POA: Diagnosis not present

## 2020-06-22 DIAGNOSIS — N2581 Secondary hyperparathyroidism of renal origin: Secondary | ICD-10-CM | POA: Diagnosis not present

## 2020-06-22 DIAGNOSIS — E1129 Type 2 diabetes mellitus with other diabetic kidney complication: Secondary | ICD-10-CM | POA: Diagnosis not present

## 2020-06-22 DIAGNOSIS — Z992 Dependence on renal dialysis: Secondary | ICD-10-CM | POA: Diagnosis not present

## 2020-06-22 DIAGNOSIS — N186 End stage renal disease: Secondary | ICD-10-CM | POA: Diagnosis not present

## 2020-06-24 DIAGNOSIS — Z992 Dependence on renal dialysis: Secondary | ICD-10-CM | POA: Diagnosis not present

## 2020-06-24 DIAGNOSIS — N2581 Secondary hyperparathyroidism of renal origin: Secondary | ICD-10-CM | POA: Diagnosis not present

## 2020-06-24 DIAGNOSIS — N186 End stage renal disease: Secondary | ICD-10-CM | POA: Diagnosis not present

## 2020-06-24 DIAGNOSIS — E1129 Type 2 diabetes mellitus with other diabetic kidney complication: Secondary | ICD-10-CM | POA: Diagnosis not present

## 2020-06-26 DIAGNOSIS — N2581 Secondary hyperparathyroidism of renal origin: Secondary | ICD-10-CM | POA: Diagnosis not present

## 2020-06-26 DIAGNOSIS — Z992 Dependence on renal dialysis: Secondary | ICD-10-CM | POA: Diagnosis not present

## 2020-06-26 DIAGNOSIS — N186 End stage renal disease: Secondary | ICD-10-CM | POA: Diagnosis not present

## 2020-06-26 DIAGNOSIS — E1129 Type 2 diabetes mellitus with other diabetic kidney complication: Secondary | ICD-10-CM | POA: Diagnosis not present

## 2020-06-29 DIAGNOSIS — N2581 Secondary hyperparathyroidism of renal origin: Secondary | ICD-10-CM | POA: Diagnosis not present

## 2020-06-29 DIAGNOSIS — Z992 Dependence on renal dialysis: Secondary | ICD-10-CM | POA: Diagnosis not present

## 2020-06-29 DIAGNOSIS — E1129 Type 2 diabetes mellitus with other diabetic kidney complication: Secondary | ICD-10-CM | POA: Diagnosis not present

## 2020-06-29 DIAGNOSIS — N186 End stage renal disease: Secondary | ICD-10-CM | POA: Diagnosis not present

## 2020-07-02 DIAGNOSIS — E1129 Type 2 diabetes mellitus with other diabetic kidney complication: Secondary | ICD-10-CM | POA: Diagnosis not present

## 2020-07-02 DIAGNOSIS — N186 End stage renal disease: Secondary | ICD-10-CM | POA: Diagnosis not present

## 2020-07-02 DIAGNOSIS — Z992 Dependence on renal dialysis: Secondary | ICD-10-CM | POA: Diagnosis not present

## 2020-07-02 DIAGNOSIS — N2581 Secondary hyperparathyroidism of renal origin: Secondary | ICD-10-CM | POA: Diagnosis not present

## 2020-07-04 DIAGNOSIS — I129 Hypertensive chronic kidney disease with stage 1 through stage 4 chronic kidney disease, or unspecified chronic kidney disease: Secondary | ICD-10-CM | POA: Diagnosis not present

## 2020-07-04 DIAGNOSIS — N186 End stage renal disease: Secondary | ICD-10-CM | POA: Diagnosis not present

## 2020-07-04 DIAGNOSIS — Z992 Dependence on renal dialysis: Secondary | ICD-10-CM | POA: Diagnosis not present

## 2020-07-04 DIAGNOSIS — D631 Anemia in chronic kidney disease: Secondary | ICD-10-CM | POA: Diagnosis not present

## 2020-07-04 DIAGNOSIS — D509 Iron deficiency anemia, unspecified: Secondary | ICD-10-CM | POA: Diagnosis not present

## 2020-07-04 DIAGNOSIS — N2581 Secondary hyperparathyroidism of renal origin: Secondary | ICD-10-CM | POA: Diagnosis not present

## 2020-07-04 DIAGNOSIS — E1129 Type 2 diabetes mellitus with other diabetic kidney complication: Secondary | ICD-10-CM | POA: Diagnosis not present

## 2020-07-06 DIAGNOSIS — E1129 Type 2 diabetes mellitus with other diabetic kidney complication: Secondary | ICD-10-CM | POA: Diagnosis not present

## 2020-07-06 DIAGNOSIS — D631 Anemia in chronic kidney disease: Secondary | ICD-10-CM | POA: Diagnosis not present

## 2020-07-06 DIAGNOSIS — N186 End stage renal disease: Secondary | ICD-10-CM | POA: Diagnosis not present

## 2020-07-06 DIAGNOSIS — Z992 Dependence on renal dialysis: Secondary | ICD-10-CM | POA: Diagnosis not present

## 2020-07-06 DIAGNOSIS — D509 Iron deficiency anemia, unspecified: Secondary | ICD-10-CM | POA: Diagnosis not present

## 2020-07-06 DIAGNOSIS — N2581 Secondary hyperparathyroidism of renal origin: Secondary | ICD-10-CM | POA: Diagnosis not present

## 2020-07-09 DIAGNOSIS — N2581 Secondary hyperparathyroidism of renal origin: Secondary | ICD-10-CM | POA: Diagnosis not present

## 2020-07-09 DIAGNOSIS — N186 End stage renal disease: Secondary | ICD-10-CM | POA: Diagnosis not present

## 2020-07-09 DIAGNOSIS — E1129 Type 2 diabetes mellitus with other diabetic kidney complication: Secondary | ICD-10-CM | POA: Diagnosis not present

## 2020-07-09 DIAGNOSIS — D509 Iron deficiency anemia, unspecified: Secondary | ICD-10-CM | POA: Diagnosis not present

## 2020-07-09 DIAGNOSIS — Z992 Dependence on renal dialysis: Secondary | ICD-10-CM | POA: Diagnosis not present

## 2020-07-09 DIAGNOSIS — D631 Anemia in chronic kidney disease: Secondary | ICD-10-CM | POA: Diagnosis not present

## 2020-07-11 DIAGNOSIS — D509 Iron deficiency anemia, unspecified: Secondary | ICD-10-CM | POA: Diagnosis not present

## 2020-07-11 DIAGNOSIS — N186 End stage renal disease: Secondary | ICD-10-CM | POA: Diagnosis not present

## 2020-07-11 DIAGNOSIS — D631 Anemia in chronic kidney disease: Secondary | ICD-10-CM | POA: Diagnosis not present

## 2020-07-11 DIAGNOSIS — E1129 Type 2 diabetes mellitus with other diabetic kidney complication: Secondary | ICD-10-CM | POA: Diagnosis not present

## 2020-07-11 DIAGNOSIS — N2581 Secondary hyperparathyroidism of renal origin: Secondary | ICD-10-CM | POA: Diagnosis not present

## 2020-07-11 DIAGNOSIS — Z992 Dependence on renal dialysis: Secondary | ICD-10-CM | POA: Diagnosis not present

## 2020-07-13 DIAGNOSIS — D631 Anemia in chronic kidney disease: Secondary | ICD-10-CM | POA: Diagnosis not present

## 2020-07-13 DIAGNOSIS — E1129 Type 2 diabetes mellitus with other diabetic kidney complication: Secondary | ICD-10-CM | POA: Diagnosis not present

## 2020-07-13 DIAGNOSIS — N186 End stage renal disease: Secondary | ICD-10-CM | POA: Diagnosis not present

## 2020-07-13 DIAGNOSIS — N2581 Secondary hyperparathyroidism of renal origin: Secondary | ICD-10-CM | POA: Diagnosis not present

## 2020-07-13 DIAGNOSIS — Z992 Dependence on renal dialysis: Secondary | ICD-10-CM | POA: Diagnosis not present

## 2020-07-13 DIAGNOSIS — D509 Iron deficiency anemia, unspecified: Secondary | ICD-10-CM | POA: Diagnosis not present

## 2020-07-16 DIAGNOSIS — D631 Anemia in chronic kidney disease: Secondary | ICD-10-CM | POA: Diagnosis not present

## 2020-07-16 DIAGNOSIS — D509 Iron deficiency anemia, unspecified: Secondary | ICD-10-CM | POA: Diagnosis not present

## 2020-07-16 DIAGNOSIS — Z992 Dependence on renal dialysis: Secondary | ICD-10-CM | POA: Diagnosis not present

## 2020-07-16 DIAGNOSIS — E1129 Type 2 diabetes mellitus with other diabetic kidney complication: Secondary | ICD-10-CM | POA: Diagnosis not present

## 2020-07-16 DIAGNOSIS — N186 End stage renal disease: Secondary | ICD-10-CM | POA: Diagnosis not present

## 2020-07-16 DIAGNOSIS — N2581 Secondary hyperparathyroidism of renal origin: Secondary | ICD-10-CM | POA: Diagnosis not present

## 2020-07-18 DIAGNOSIS — Z992 Dependence on renal dialysis: Secondary | ICD-10-CM | POA: Diagnosis not present

## 2020-07-18 DIAGNOSIS — N2581 Secondary hyperparathyroidism of renal origin: Secondary | ICD-10-CM | POA: Diagnosis not present

## 2020-07-18 DIAGNOSIS — E1129 Type 2 diabetes mellitus with other diabetic kidney complication: Secondary | ICD-10-CM | POA: Diagnosis not present

## 2020-07-18 DIAGNOSIS — D631 Anemia in chronic kidney disease: Secondary | ICD-10-CM | POA: Diagnosis not present

## 2020-07-18 DIAGNOSIS — N186 End stage renal disease: Secondary | ICD-10-CM | POA: Diagnosis not present

## 2020-07-18 DIAGNOSIS — D509 Iron deficiency anemia, unspecified: Secondary | ICD-10-CM | POA: Diagnosis not present

## 2020-07-20 DIAGNOSIS — D509 Iron deficiency anemia, unspecified: Secondary | ICD-10-CM | POA: Diagnosis not present

## 2020-07-20 DIAGNOSIS — N2581 Secondary hyperparathyroidism of renal origin: Secondary | ICD-10-CM | POA: Diagnosis not present

## 2020-07-20 DIAGNOSIS — E1129 Type 2 diabetes mellitus with other diabetic kidney complication: Secondary | ICD-10-CM | POA: Diagnosis not present

## 2020-07-20 DIAGNOSIS — D631 Anemia in chronic kidney disease: Secondary | ICD-10-CM | POA: Diagnosis not present

## 2020-07-20 DIAGNOSIS — Z992 Dependence on renal dialysis: Secondary | ICD-10-CM | POA: Diagnosis not present

## 2020-07-20 DIAGNOSIS — N186 End stage renal disease: Secondary | ICD-10-CM | POA: Diagnosis not present

## 2020-07-23 DIAGNOSIS — N186 End stage renal disease: Secondary | ICD-10-CM | POA: Diagnosis not present

## 2020-07-23 DIAGNOSIS — E1129 Type 2 diabetes mellitus with other diabetic kidney complication: Secondary | ICD-10-CM | POA: Diagnosis not present

## 2020-07-23 DIAGNOSIS — Z992 Dependence on renal dialysis: Secondary | ICD-10-CM | POA: Diagnosis not present

## 2020-07-23 DIAGNOSIS — N2581 Secondary hyperparathyroidism of renal origin: Secondary | ICD-10-CM | POA: Diagnosis not present

## 2020-07-23 DIAGNOSIS — D631 Anemia in chronic kidney disease: Secondary | ICD-10-CM | POA: Diagnosis not present

## 2020-07-23 DIAGNOSIS — D509 Iron deficiency anemia, unspecified: Secondary | ICD-10-CM | POA: Diagnosis not present

## 2020-07-25 DIAGNOSIS — D509 Iron deficiency anemia, unspecified: Secondary | ICD-10-CM | POA: Diagnosis not present

## 2020-07-25 DIAGNOSIS — Z992 Dependence on renal dialysis: Secondary | ICD-10-CM | POA: Diagnosis not present

## 2020-07-25 DIAGNOSIS — E1129 Type 2 diabetes mellitus with other diabetic kidney complication: Secondary | ICD-10-CM | POA: Diagnosis not present

## 2020-07-25 DIAGNOSIS — N2581 Secondary hyperparathyroidism of renal origin: Secondary | ICD-10-CM | POA: Diagnosis not present

## 2020-07-25 DIAGNOSIS — N186 End stage renal disease: Secondary | ICD-10-CM | POA: Diagnosis not present

## 2020-07-25 DIAGNOSIS — D631 Anemia in chronic kidney disease: Secondary | ICD-10-CM | POA: Diagnosis not present

## 2020-07-27 DIAGNOSIS — N2581 Secondary hyperparathyroidism of renal origin: Secondary | ICD-10-CM | POA: Diagnosis not present

## 2020-07-27 DIAGNOSIS — D631 Anemia in chronic kidney disease: Secondary | ICD-10-CM | POA: Diagnosis not present

## 2020-07-27 DIAGNOSIS — N186 End stage renal disease: Secondary | ICD-10-CM | POA: Diagnosis not present

## 2020-07-27 DIAGNOSIS — Z992 Dependence on renal dialysis: Secondary | ICD-10-CM | POA: Diagnosis not present

## 2020-07-27 DIAGNOSIS — D509 Iron deficiency anemia, unspecified: Secondary | ICD-10-CM | POA: Diagnosis not present

## 2020-07-27 DIAGNOSIS — E1129 Type 2 diabetes mellitus with other diabetic kidney complication: Secondary | ICD-10-CM | POA: Diagnosis not present

## 2020-07-30 DIAGNOSIS — D631 Anemia in chronic kidney disease: Secondary | ICD-10-CM | POA: Diagnosis not present

## 2020-07-30 DIAGNOSIS — Z992 Dependence on renal dialysis: Secondary | ICD-10-CM | POA: Diagnosis not present

## 2020-07-30 DIAGNOSIS — E1129 Type 2 diabetes mellitus with other diabetic kidney complication: Secondary | ICD-10-CM | POA: Diagnosis not present

## 2020-07-30 DIAGNOSIS — N186 End stage renal disease: Secondary | ICD-10-CM | POA: Diagnosis not present

## 2020-07-30 DIAGNOSIS — D509 Iron deficiency anemia, unspecified: Secondary | ICD-10-CM | POA: Diagnosis not present

## 2020-07-30 DIAGNOSIS — N2581 Secondary hyperparathyroidism of renal origin: Secondary | ICD-10-CM | POA: Diagnosis not present

## 2020-08-01 DIAGNOSIS — D509 Iron deficiency anemia, unspecified: Secondary | ICD-10-CM | POA: Diagnosis not present

## 2020-08-01 DIAGNOSIS — N186 End stage renal disease: Secondary | ICD-10-CM | POA: Diagnosis not present

## 2020-08-01 DIAGNOSIS — N2581 Secondary hyperparathyroidism of renal origin: Secondary | ICD-10-CM | POA: Diagnosis not present

## 2020-08-01 DIAGNOSIS — Z992 Dependence on renal dialysis: Secondary | ICD-10-CM | POA: Diagnosis not present

## 2020-08-01 DIAGNOSIS — E1129 Type 2 diabetes mellitus with other diabetic kidney complication: Secondary | ICD-10-CM | POA: Diagnosis not present

## 2020-08-01 DIAGNOSIS — D631 Anemia in chronic kidney disease: Secondary | ICD-10-CM | POA: Diagnosis not present

## 2020-08-03 DIAGNOSIS — E1129 Type 2 diabetes mellitus with other diabetic kidney complication: Secondary | ICD-10-CM | POA: Diagnosis not present

## 2020-08-03 DIAGNOSIS — D509 Iron deficiency anemia, unspecified: Secondary | ICD-10-CM | POA: Diagnosis not present

## 2020-08-03 DIAGNOSIS — Z992 Dependence on renal dialysis: Secondary | ICD-10-CM | POA: Diagnosis not present

## 2020-08-03 DIAGNOSIS — D631 Anemia in chronic kidney disease: Secondary | ICD-10-CM | POA: Diagnosis not present

## 2020-08-03 DIAGNOSIS — N2581 Secondary hyperparathyroidism of renal origin: Secondary | ICD-10-CM | POA: Diagnosis not present

## 2020-08-03 DIAGNOSIS — N186 End stage renal disease: Secondary | ICD-10-CM | POA: Diagnosis not present

## 2020-08-04 DIAGNOSIS — N186 End stage renal disease: Secondary | ICD-10-CM | POA: Diagnosis not present

## 2020-08-04 DIAGNOSIS — Z992 Dependence on renal dialysis: Secondary | ICD-10-CM | POA: Diagnosis not present

## 2020-08-04 DIAGNOSIS — I129 Hypertensive chronic kidney disease with stage 1 through stage 4 chronic kidney disease, or unspecified chronic kidney disease: Secondary | ICD-10-CM | POA: Diagnosis not present

## 2020-08-06 DIAGNOSIS — Z992 Dependence on renal dialysis: Secondary | ICD-10-CM | POA: Diagnosis not present

## 2020-08-06 DIAGNOSIS — D631 Anemia in chronic kidney disease: Secondary | ICD-10-CM | POA: Diagnosis not present

## 2020-08-06 DIAGNOSIS — N2581 Secondary hyperparathyroidism of renal origin: Secondary | ICD-10-CM | POA: Diagnosis not present

## 2020-08-06 DIAGNOSIS — E1129 Type 2 diabetes mellitus with other diabetic kidney complication: Secondary | ICD-10-CM | POA: Diagnosis not present

## 2020-08-06 DIAGNOSIS — D509 Iron deficiency anemia, unspecified: Secondary | ICD-10-CM | POA: Diagnosis not present

## 2020-08-06 DIAGNOSIS — N186 End stage renal disease: Secondary | ICD-10-CM | POA: Diagnosis not present

## 2020-08-08 DIAGNOSIS — D509 Iron deficiency anemia, unspecified: Secondary | ICD-10-CM | POA: Diagnosis not present

## 2020-08-08 DIAGNOSIS — Z992 Dependence on renal dialysis: Secondary | ICD-10-CM | POA: Diagnosis not present

## 2020-08-08 DIAGNOSIS — N2581 Secondary hyperparathyroidism of renal origin: Secondary | ICD-10-CM | POA: Diagnosis not present

## 2020-08-08 DIAGNOSIS — D631 Anemia in chronic kidney disease: Secondary | ICD-10-CM | POA: Diagnosis not present

## 2020-08-08 DIAGNOSIS — N186 End stage renal disease: Secondary | ICD-10-CM | POA: Diagnosis not present

## 2020-08-08 DIAGNOSIS — E1129 Type 2 diabetes mellitus with other diabetic kidney complication: Secondary | ICD-10-CM | POA: Diagnosis not present

## 2020-08-10 DIAGNOSIS — E1129 Type 2 diabetes mellitus with other diabetic kidney complication: Secondary | ICD-10-CM | POA: Diagnosis not present

## 2020-08-10 DIAGNOSIS — D509 Iron deficiency anemia, unspecified: Secondary | ICD-10-CM | POA: Diagnosis not present

## 2020-08-10 DIAGNOSIS — N186 End stage renal disease: Secondary | ICD-10-CM | POA: Diagnosis not present

## 2020-08-10 DIAGNOSIS — Z992 Dependence on renal dialysis: Secondary | ICD-10-CM | POA: Diagnosis not present

## 2020-08-10 DIAGNOSIS — D631 Anemia in chronic kidney disease: Secondary | ICD-10-CM | POA: Diagnosis not present

## 2020-08-10 DIAGNOSIS — N2581 Secondary hyperparathyroidism of renal origin: Secondary | ICD-10-CM | POA: Diagnosis not present

## 2020-08-13 DIAGNOSIS — Z992 Dependence on renal dialysis: Secondary | ICD-10-CM | POA: Diagnosis not present

## 2020-08-13 DIAGNOSIS — N186 End stage renal disease: Secondary | ICD-10-CM | POA: Diagnosis not present

## 2020-08-13 DIAGNOSIS — E1129 Type 2 diabetes mellitus with other diabetic kidney complication: Secondary | ICD-10-CM | POA: Diagnosis not present

## 2020-08-13 DIAGNOSIS — D631 Anemia in chronic kidney disease: Secondary | ICD-10-CM | POA: Diagnosis not present

## 2020-08-13 DIAGNOSIS — D509 Iron deficiency anemia, unspecified: Secondary | ICD-10-CM | POA: Diagnosis not present

## 2020-08-13 DIAGNOSIS — N2581 Secondary hyperparathyroidism of renal origin: Secondary | ICD-10-CM | POA: Diagnosis not present

## 2020-08-15 DIAGNOSIS — N186 End stage renal disease: Secondary | ICD-10-CM | POA: Diagnosis not present

## 2020-08-15 DIAGNOSIS — D631 Anemia in chronic kidney disease: Secondary | ICD-10-CM | POA: Diagnosis not present

## 2020-08-15 DIAGNOSIS — Z992 Dependence on renal dialysis: Secondary | ICD-10-CM | POA: Diagnosis not present

## 2020-08-15 DIAGNOSIS — E1129 Type 2 diabetes mellitus with other diabetic kidney complication: Secondary | ICD-10-CM | POA: Diagnosis not present

## 2020-08-15 DIAGNOSIS — D509 Iron deficiency anemia, unspecified: Secondary | ICD-10-CM | POA: Diagnosis not present

## 2020-08-15 DIAGNOSIS — N2581 Secondary hyperparathyroidism of renal origin: Secondary | ICD-10-CM | POA: Diagnosis not present

## 2020-08-17 DIAGNOSIS — Z992 Dependence on renal dialysis: Secondary | ICD-10-CM | POA: Diagnosis not present

## 2020-08-17 DIAGNOSIS — D509 Iron deficiency anemia, unspecified: Secondary | ICD-10-CM | POA: Diagnosis not present

## 2020-08-17 DIAGNOSIS — D631 Anemia in chronic kidney disease: Secondary | ICD-10-CM | POA: Diagnosis not present

## 2020-08-17 DIAGNOSIS — N2581 Secondary hyperparathyroidism of renal origin: Secondary | ICD-10-CM | POA: Diagnosis not present

## 2020-08-17 DIAGNOSIS — N186 End stage renal disease: Secondary | ICD-10-CM | POA: Diagnosis not present

## 2020-08-17 DIAGNOSIS — E1129 Type 2 diabetes mellitus with other diabetic kidney complication: Secondary | ICD-10-CM | POA: Diagnosis not present

## 2020-08-20 DIAGNOSIS — D509 Iron deficiency anemia, unspecified: Secondary | ICD-10-CM | POA: Diagnosis not present

## 2020-08-20 DIAGNOSIS — N2581 Secondary hyperparathyroidism of renal origin: Secondary | ICD-10-CM | POA: Diagnosis not present

## 2020-08-20 DIAGNOSIS — Z992 Dependence on renal dialysis: Secondary | ICD-10-CM | POA: Diagnosis not present

## 2020-08-20 DIAGNOSIS — E1129 Type 2 diabetes mellitus with other diabetic kidney complication: Secondary | ICD-10-CM | POA: Diagnosis not present

## 2020-08-20 DIAGNOSIS — D631 Anemia in chronic kidney disease: Secondary | ICD-10-CM | POA: Diagnosis not present

## 2020-08-20 DIAGNOSIS — N186 End stage renal disease: Secondary | ICD-10-CM | POA: Diagnosis not present

## 2020-08-22 DIAGNOSIS — E1129 Type 2 diabetes mellitus with other diabetic kidney complication: Secondary | ICD-10-CM | POA: Diagnosis not present

## 2020-08-22 DIAGNOSIS — D509 Iron deficiency anemia, unspecified: Secondary | ICD-10-CM | POA: Diagnosis not present

## 2020-08-22 DIAGNOSIS — D631 Anemia in chronic kidney disease: Secondary | ICD-10-CM | POA: Diagnosis not present

## 2020-08-22 DIAGNOSIS — N186 End stage renal disease: Secondary | ICD-10-CM | POA: Diagnosis not present

## 2020-08-22 DIAGNOSIS — Z992 Dependence on renal dialysis: Secondary | ICD-10-CM | POA: Diagnosis not present

## 2020-08-22 DIAGNOSIS — N2581 Secondary hyperparathyroidism of renal origin: Secondary | ICD-10-CM | POA: Diagnosis not present

## 2020-08-24 DIAGNOSIS — D631 Anemia in chronic kidney disease: Secondary | ICD-10-CM | POA: Diagnosis not present

## 2020-08-24 DIAGNOSIS — D509 Iron deficiency anemia, unspecified: Secondary | ICD-10-CM | POA: Diagnosis not present

## 2020-08-24 DIAGNOSIS — N2581 Secondary hyperparathyroidism of renal origin: Secondary | ICD-10-CM | POA: Diagnosis not present

## 2020-08-24 DIAGNOSIS — N186 End stage renal disease: Secondary | ICD-10-CM | POA: Diagnosis not present

## 2020-08-24 DIAGNOSIS — Z992 Dependence on renal dialysis: Secondary | ICD-10-CM | POA: Diagnosis not present

## 2020-08-24 DIAGNOSIS — E1129 Type 2 diabetes mellitus with other diabetic kidney complication: Secondary | ICD-10-CM | POA: Diagnosis not present

## 2020-08-27 DIAGNOSIS — N2581 Secondary hyperparathyroidism of renal origin: Secondary | ICD-10-CM | POA: Diagnosis not present

## 2020-08-27 DIAGNOSIS — E1129 Type 2 diabetes mellitus with other diabetic kidney complication: Secondary | ICD-10-CM | POA: Diagnosis not present

## 2020-08-27 DIAGNOSIS — D509 Iron deficiency anemia, unspecified: Secondary | ICD-10-CM | POA: Diagnosis not present

## 2020-08-27 DIAGNOSIS — Z992 Dependence on renal dialysis: Secondary | ICD-10-CM | POA: Diagnosis not present

## 2020-08-27 DIAGNOSIS — N186 End stage renal disease: Secondary | ICD-10-CM | POA: Diagnosis not present

## 2020-08-27 DIAGNOSIS — D631 Anemia in chronic kidney disease: Secondary | ICD-10-CM | POA: Diagnosis not present

## 2020-08-29 DIAGNOSIS — N186 End stage renal disease: Secondary | ICD-10-CM | POA: Diagnosis not present

## 2020-08-29 DIAGNOSIS — D631 Anemia in chronic kidney disease: Secondary | ICD-10-CM | POA: Diagnosis not present

## 2020-08-29 DIAGNOSIS — E1129 Type 2 diabetes mellitus with other diabetic kidney complication: Secondary | ICD-10-CM | POA: Diagnosis not present

## 2020-08-29 DIAGNOSIS — Z992 Dependence on renal dialysis: Secondary | ICD-10-CM | POA: Diagnosis not present

## 2020-08-29 DIAGNOSIS — D509 Iron deficiency anemia, unspecified: Secondary | ICD-10-CM | POA: Diagnosis not present

## 2020-08-29 DIAGNOSIS — N2581 Secondary hyperparathyroidism of renal origin: Secondary | ICD-10-CM | POA: Diagnosis not present

## 2020-08-31 DIAGNOSIS — N186 End stage renal disease: Secondary | ICD-10-CM | POA: Diagnosis not present

## 2020-08-31 DIAGNOSIS — E1129 Type 2 diabetes mellitus with other diabetic kidney complication: Secondary | ICD-10-CM | POA: Diagnosis not present

## 2020-08-31 DIAGNOSIS — N2581 Secondary hyperparathyroidism of renal origin: Secondary | ICD-10-CM | POA: Diagnosis not present

## 2020-08-31 DIAGNOSIS — D509 Iron deficiency anemia, unspecified: Secondary | ICD-10-CM | POA: Diagnosis not present

## 2020-08-31 DIAGNOSIS — D631 Anemia in chronic kidney disease: Secondary | ICD-10-CM | POA: Diagnosis not present

## 2020-08-31 DIAGNOSIS — Z992 Dependence on renal dialysis: Secondary | ICD-10-CM | POA: Diagnosis not present

## 2020-09-03 DIAGNOSIS — N186 End stage renal disease: Secondary | ICD-10-CM | POA: Diagnosis not present

## 2020-09-03 DIAGNOSIS — Z992 Dependence on renal dialysis: Secondary | ICD-10-CM | POA: Diagnosis not present

## 2020-09-03 DIAGNOSIS — E1129 Type 2 diabetes mellitus with other diabetic kidney complication: Secondary | ICD-10-CM | POA: Diagnosis not present

## 2020-09-03 DIAGNOSIS — D631 Anemia in chronic kidney disease: Secondary | ICD-10-CM | POA: Diagnosis not present

## 2020-09-03 DIAGNOSIS — N2581 Secondary hyperparathyroidism of renal origin: Secondary | ICD-10-CM | POA: Diagnosis not present

## 2020-09-03 DIAGNOSIS — D509 Iron deficiency anemia, unspecified: Secondary | ICD-10-CM | POA: Diagnosis not present

## 2020-09-04 DIAGNOSIS — Z992 Dependence on renal dialysis: Secondary | ICD-10-CM | POA: Diagnosis not present

## 2020-09-04 DIAGNOSIS — N186 End stage renal disease: Secondary | ICD-10-CM | POA: Diagnosis not present

## 2020-09-04 DIAGNOSIS — I129 Hypertensive chronic kidney disease with stage 1 through stage 4 chronic kidney disease, or unspecified chronic kidney disease: Secondary | ICD-10-CM | POA: Diagnosis not present

## 2020-09-05 DIAGNOSIS — D509 Iron deficiency anemia, unspecified: Secondary | ICD-10-CM | POA: Diagnosis not present

## 2020-09-05 DIAGNOSIS — N186 End stage renal disease: Secondary | ICD-10-CM | POA: Diagnosis not present

## 2020-09-05 DIAGNOSIS — N2581 Secondary hyperparathyroidism of renal origin: Secondary | ICD-10-CM | POA: Diagnosis not present

## 2020-09-05 DIAGNOSIS — Z992 Dependence on renal dialysis: Secondary | ICD-10-CM | POA: Diagnosis not present

## 2020-09-05 DIAGNOSIS — E1129 Type 2 diabetes mellitus with other diabetic kidney complication: Secondary | ICD-10-CM | POA: Diagnosis not present

## 2020-09-05 DIAGNOSIS — D631 Anemia in chronic kidney disease: Secondary | ICD-10-CM | POA: Diagnosis not present

## 2020-09-07 DIAGNOSIS — E1129 Type 2 diabetes mellitus with other diabetic kidney complication: Secondary | ICD-10-CM | POA: Diagnosis not present

## 2020-09-07 DIAGNOSIS — N2581 Secondary hyperparathyroidism of renal origin: Secondary | ICD-10-CM | POA: Diagnosis not present

## 2020-09-07 DIAGNOSIS — N186 End stage renal disease: Secondary | ICD-10-CM | POA: Diagnosis not present

## 2020-09-07 DIAGNOSIS — D631 Anemia in chronic kidney disease: Secondary | ICD-10-CM | POA: Diagnosis not present

## 2020-09-07 DIAGNOSIS — Z992 Dependence on renal dialysis: Secondary | ICD-10-CM | POA: Diagnosis not present

## 2020-09-07 DIAGNOSIS — D509 Iron deficiency anemia, unspecified: Secondary | ICD-10-CM | POA: Diagnosis not present

## 2020-09-10 DIAGNOSIS — E1129 Type 2 diabetes mellitus with other diabetic kidney complication: Secondary | ICD-10-CM | POA: Diagnosis not present

## 2020-09-10 DIAGNOSIS — N186 End stage renal disease: Secondary | ICD-10-CM | POA: Diagnosis not present

## 2020-09-10 DIAGNOSIS — D631 Anemia in chronic kidney disease: Secondary | ICD-10-CM | POA: Diagnosis not present

## 2020-09-10 DIAGNOSIS — D509 Iron deficiency anemia, unspecified: Secondary | ICD-10-CM | POA: Diagnosis not present

## 2020-09-10 DIAGNOSIS — N2581 Secondary hyperparathyroidism of renal origin: Secondary | ICD-10-CM | POA: Diagnosis not present

## 2020-09-10 DIAGNOSIS — Z992 Dependence on renal dialysis: Secondary | ICD-10-CM | POA: Diagnosis not present

## 2020-09-12 DIAGNOSIS — Z992 Dependence on renal dialysis: Secondary | ICD-10-CM | POA: Diagnosis not present

## 2020-09-12 DIAGNOSIS — D509 Iron deficiency anemia, unspecified: Secondary | ICD-10-CM | POA: Diagnosis not present

## 2020-09-12 DIAGNOSIS — N2581 Secondary hyperparathyroidism of renal origin: Secondary | ICD-10-CM | POA: Diagnosis not present

## 2020-09-12 DIAGNOSIS — N186 End stage renal disease: Secondary | ICD-10-CM | POA: Diagnosis not present

## 2020-09-12 DIAGNOSIS — D631 Anemia in chronic kidney disease: Secondary | ICD-10-CM | POA: Diagnosis not present

## 2020-09-12 DIAGNOSIS — E1129 Type 2 diabetes mellitus with other diabetic kidney complication: Secondary | ICD-10-CM | POA: Diagnosis not present

## 2020-09-13 ENCOUNTER — Ambulatory Visit (INDEPENDENT_AMBULATORY_CARE_PROVIDER_SITE_OTHER): Payer: Medicare Other | Admitting: Nurse Practitioner

## 2020-09-13 ENCOUNTER — Other Ambulatory Visit: Payer: Self-pay

## 2020-09-13 ENCOUNTER — Ambulatory Visit (INDEPENDENT_AMBULATORY_CARE_PROVIDER_SITE_OTHER): Payer: Medicare Other

## 2020-09-13 ENCOUNTER — Encounter (INDEPENDENT_AMBULATORY_CARE_PROVIDER_SITE_OTHER): Payer: Self-pay | Admitting: Nurse Practitioner

## 2020-09-13 VITALS — BP 189/109 | HR 105 | Resp 16 | Wt 164.8 lb

## 2020-09-13 DIAGNOSIS — Z992 Dependence on renal dialysis: Secondary | ICD-10-CM

## 2020-09-13 DIAGNOSIS — I10 Essential (primary) hypertension: Secondary | ICD-10-CM

## 2020-09-13 DIAGNOSIS — N186 End stage renal disease: Secondary | ICD-10-CM

## 2020-09-13 DIAGNOSIS — E1122 Type 2 diabetes mellitus with diabetic chronic kidney disease: Secondary | ICD-10-CM

## 2020-09-14 DIAGNOSIS — D631 Anemia in chronic kidney disease: Secondary | ICD-10-CM | POA: Diagnosis not present

## 2020-09-14 DIAGNOSIS — N186 End stage renal disease: Secondary | ICD-10-CM | POA: Diagnosis not present

## 2020-09-14 DIAGNOSIS — Z992 Dependence on renal dialysis: Secondary | ICD-10-CM | POA: Diagnosis not present

## 2020-09-14 DIAGNOSIS — D509 Iron deficiency anemia, unspecified: Secondary | ICD-10-CM | POA: Diagnosis not present

## 2020-09-14 DIAGNOSIS — E1129 Type 2 diabetes mellitus with other diabetic kidney complication: Secondary | ICD-10-CM | POA: Diagnosis not present

## 2020-09-14 DIAGNOSIS — N2581 Secondary hyperparathyroidism of renal origin: Secondary | ICD-10-CM | POA: Diagnosis not present

## 2020-09-16 ENCOUNTER — Encounter (INDEPENDENT_AMBULATORY_CARE_PROVIDER_SITE_OTHER): Payer: Self-pay | Admitting: Nurse Practitioner

## 2020-09-16 NOTE — Progress Notes (Signed)
Subjective:    Patient ID: Ruth Gutierrez, female    DOB: 07/20/1966, 54 y.o.   MRN: [id number] Chief Complaint  Patient presents with  . Follow-up    Est pt ref Justin Mend drop in access flow    The patient returns to the office for followup of their dialysis access. The function of the access has been stable. The patient denies increased bleeding time or increased recirculation. Patient denies difficulty with cannulation. The patient denies hand pain or other symptoms consistent with steal phenomena.  No significant arm swelling.  The patient notes that the wound issues she had was with bleeding for 1 day and its when the technician stopped her lower than usual and too deep.  The patient has a very superficial fistula.  Other than that she denies any other issues.  The patient denies redness or swelling at the access site. The patient denies fever or chills at home or while on dialysis.  The patient denies amaurosis fugax or recent TIA symptoms. There are no recent neurological changes noted. The patient denies claudication symptoms or rest pain symptoms. The patient denies history of DVT, PE or superficial thrombophlebitis. The patient denies recent episodes of angina or shortness of breath.   The patient has a flow volume of 2100.  No evidence of any significant stenosis.      Review of Systems  Hematological: Bruises/bleeds easily.  All other systems reviewed and are negative.      Objective:   Physical Exam Vitals reviewed.  HENT:     Head: Normocephalic.  Cardiovascular:     Rate and Rhythm: Normal rate.     Pulses: Normal pulses.          Radial pulses are 2+ on the left side.     Arteriovenous access: left arteriovenous access is present.    Comments: Good thrill and bruit Pulmonary:     Effort: Pulmonary effort is normal.  Skin:    General: Skin is warm and dry.  Neurological:     Mental Status: She is alert and oriented to person, place, and time.   Psychiatric:        Mood and Affect: Mood normal.        Behavior: Behavior normal.        Thought Content: Thought content normal.        Judgment: Judgment normal.     BP (!) 189/109 (BP Location: Right Arm)   Pulse (!) 105   Resp 16   Wt 164 lb 12.8 oz (74.8 kg)   LMP 06/02/2015   BMI 25.81 kg/m   Past Medical History:  Diagnosis Date  . Anemia of chronic disease   . Arthritis   . Deceased-donor kidney transplant    Performed at [facility], April 2010.  Initial ESRD due to HTN nephropathy  . Eczema   . ESRD (end stage renal disease) ([place])    s/p transplant creatinine baseline 1.1  M/W/F dialysis  . FUO (fever of unknown origin) 05/17/2015  . GERD (gastroesophageal reflux disease)   . Headache(784.0)   . History of hyperparathyroidism   . Hypertension   . Peritonitis ([place]) 10/2019  . Shortness of breath   . Wears glasses     Social History   Socioeconomic History  . Marital status: Single    Spouse name: Not on file  . Number of children: 1  . Years of education: Not on file  . Highest education level: Not on file  Occupational History  . Occupation: unemployed  Tobacco Use  . Smoking status: Current Every Day Smoker    Packs/day: 0.50    Years: 27.00    Pack years: 13.50    Types: Cigarettes  . Smokeless tobacco: Never Used  . Tobacco comment: 10 cigarettes a day  Vaping Use  . Vaping Use: Never used  Substance and Sexual Activity  . Alcohol use: No    Alcohol/week: 0.0 standard drinks    Comment: occasional drinker noted in the past  . Drug use: No  . Sexual activity: Yes    Partners: Male    Birth control/protection: None  Other Topics Concern  . Not on file  Social History Narrative   Single, 1 daughter   Lives with daughter (born 105) and grandkids   sister helps her with medications etc.   cigarette smoker, rare EtOH, no drugs   Social Determinants of Radio broadcast assistant Strain: Not on file  Food Insecurity: Not on file   Transportation Needs: Not on file  Physical Activity: Not on file  Stress: Not on file  Social Connections: Not on file  Intimate Partner Violence: Not on file    Past Surgical History:  Procedure Laterality Date  . A/V FISTULAGRAM Left 02/12/2017   Procedure: A/V Fistulagram;  Surgeon: Algernon Huxley, MD;  Location: [place] [facility];  Service: Cardiovascular;  Laterality: Left;  . A/V FISTULAGRAM Left 12/30/2017   Procedure: A/V FISTULAGRAM;  Surgeon: Algernon Huxley, MD;  Location: [place] [facility];  Service: Cardiovascular;  Laterality: Left;  . A/V SHUNT INTERVENTION N/A 02/12/2017   Procedure: A/V Shunt Intervention;  Surgeon: Algernon Huxley, MD;  Location: [place] [facility];  Service: Cardiovascular;  Laterality: N/A;  . AV FISTULA PLACEMENT    . BASCILIC VEIN TRANSPOSITION Left 10/30/2014   Procedure: LEFT BASCILIC VEIN TRANSPOSITION;  Surgeon: Rosetta Posner, MD;  Location: [place];  Service: Vascular;  Laterality: Left;  . BASCILIC VEIN TRANSPOSITION Left 01/03/2015   Procedure: LEFT ARM 2ND STAGE BASCILIC VEIN TRANSPOSITION;  Surgeon: Rosetta Posner, MD;  Location: [place];  Service: Vascular;  Laterality: Left;  . CAPD REMOVAL N/A 10/31/2019   Procedure: PERITONEAL DIALYSIS  (CAPD) INFECTED CATHETER REMOVAL;  Surgeon: Coralie Keens, MD;  Location: [place];  Service: General;  Laterality: N/A;  . FRACTURE SURGERY     left foot,baby toe nad next toe missing  . INSERTION OF DIALYSIS CATHETER Right 10/30/2014   Procedure: INSERTION OF DIALYSIS CATHETER;  Surgeon: Rosetta Posner, MD;  Location: [place];  Service: Vascular;  Laterality: Right;  . KIDNEY TRANSPLANT  11/2008   [facility])  . WISDOM TOOTH EXTRACTION      Family History  Problem Relation Age of Onset  . Hypertension Mother   . Hypertension Father   . Diabetes Brother   . Deep vein thrombosis Brother   . Hypertension Sister   . Hyperlipidemia Sister   . Kidney disease Brother        on HD  . Hypertension Sister    . Colon cancer Neg Hx   . Esophageal cancer Neg Hx   . Rectal cancer Neg Hx     Allergies  Allergen Reactions  . Penicillin G Itching and Rash  . Penicillins Itching and Rash    Did it involve swelling of the face/tongue/throat, SOB, or low BP?Y Did it involve sudden or severe rash/hives, skin peeling, or any reaction on the inside of  your mouth or nose? Y Did you need to seek medical attention at a hospital or doctor's office? Y When did it last happen?2016 If all above answers are "NO", may proceed with cephalosporin use.    CBC Latest Ref Rng & Units 11/08/2019 11/01/2019 10/31/2019  WBC 3.4 - 10.8 x10E3/uL 8.5 25.4(H) 18.9(H)  Hemoglobin 11.1 - 15.9 g/dL 8.9(L) 11.4(L) 10.9(L)  Hematocrit 34.0 - 46.6 % 26.4(L) 34.4(L) 33.4(L)  Platelets 150 - 450 x10E3/uL 223 215 207      CMP     Component Value Date/Time   NA 142 11/08/2019 0940   K 3.6 11/08/2019 0940   CL 95 (L) 11/08/2019 0940   CO2 29 11/08/2019 0940   GLUCOSE 78 11/08/2019 0940   GLUCOSE 89 11/01/2019 0425   BUN 16 11/08/2019 0940   CREATININE 5.68 (H) 11/08/2019 0940   CREATININE 1.47 (H) 04/14/2013 1149   CALCIUM 8.6 (L) 11/08/2019 0940   CALCIUM 7.2 (L) 01/13/2007 0830   PROT 6.4 (L) 10/28/2019 1114   ALBUMIN 1.9 (L) 11/01/2019 0425   AST 16 10/28/2019 1114   ALT 17 10/28/2019 1114   ALKPHOS 162 (H) 10/28/2019 1114   BILITOT 4.4 (H) 10/28/2019 1114   GFRNONAA 8 (L) 11/08/2019 0940   GFRNONAA 43 (L) 04/14/2013 1149   GFRAA 9 (L) 11/08/2019 0940   GFRAA 49 (L) 04/14/2013 1149     No results found.     Assessment & Plan:   1. ESRD on dialysis [facility] [place]) Recommend:  The patient is doing well and currently has adequate dialysis access.  I suspect that recent issues were caused by technician access issues versus issues within the fistula itself. The patient's dialysis center is not reporting any access issues. Flow pattern is stable when compared to the prior ultrasound.  The patient should have a  duplex ultrasound of the dialysis access in 6 months. The patient will follow-up with me in the office after each ultrasound     2. Essential hypertension Continue antihypertensive medications as already ordered, these medications have been reviewed and there are no changes at this time.   3. Type 2 diabetes mellitus with chronic kidney disease on chronic dialysis, without long-term current use of insulin (HCC) Continue hypoglycemic medications as already ordered, these medications have been reviewed and there are no changes at this time.  Hgb A1C to be monitored as already arranged by primary service    Current Outpatient Medications on File Prior to Visit  Medication Sig Dispense Refill  . amLODipine (NORVASC) 5 MG tablet Take by mouth.    Marland Kitchen aspirin 81 MG EC tablet Take 81 mg by mouth as directed.    . calcitRIOL (ROCALTROL) 0.25 MCG capsule Take 0.25 mcg by mouth daily.    . cholecalciferol (VITAMIN D3) 25 MCG (1000 UNIT) tablet Take 1,000 Units by mouth daily.    . cinacalcet (SENSIPAR) 90 MG tablet Take 90 mg by mouth daily.     Marland Kitchen lidocaine-prilocaine (EMLA) cream SMARTSIG:Sparingly Topical As Directed    . Methoxy PEG-Epoetin Beta (MIRCERA IJ) Mircera    . omeprazole (PRILOSEC OTC) 20 MG tablet Take 20 mg by mouth daily as needed for heartburn.    . polyethylene glycol (MIRALAX / GLYCOLAX) 17 g packet Take 17 g by mouth daily. (Patient taking differently: Take 17 g by mouth daily as needed for mild constipation.) 30 each 0  . potassium chloride SA (KLOR-CON) 20 MEQ tablet Take 2 tablets (40 mEq total) by mouth daily. 60 tablet  0  . sucroferric oxyhydroxide (VELPHORO) 500 MG chewable tablet Chew 1,500 mg by mouth 3 (three) times daily with meals. Taking one tablet with a snack sometimes    . Tuberculin PPD (TUBERSOL ID) Inject into the skin.     No current facility-administered medications on file prior to visit.    There are no Patient Instructions on file for this visit. No  follow-ups on file.   Kris Hartmann, NP

## 2020-09-17 DIAGNOSIS — D509 Iron deficiency anemia, unspecified: Secondary | ICD-10-CM | POA: Diagnosis not present

## 2020-09-17 DIAGNOSIS — D631 Anemia in chronic kidney disease: Secondary | ICD-10-CM | POA: Diagnosis not present

## 2020-09-17 DIAGNOSIS — N186 End stage renal disease: Secondary | ICD-10-CM | POA: Diagnosis not present

## 2020-09-17 DIAGNOSIS — E1129 Type 2 diabetes mellitus with other diabetic kidney complication: Secondary | ICD-10-CM | POA: Diagnosis not present

## 2020-09-17 DIAGNOSIS — Z992 Dependence on renal dialysis: Secondary | ICD-10-CM | POA: Diagnosis not present

## 2020-09-17 DIAGNOSIS — N2581 Secondary hyperparathyroidism of renal origin: Secondary | ICD-10-CM | POA: Diagnosis not present

## 2020-09-19 DIAGNOSIS — E1129 Type 2 diabetes mellitus with other diabetic kidney complication: Secondary | ICD-10-CM | POA: Diagnosis not present

## 2020-09-19 DIAGNOSIS — D631 Anemia in chronic kidney disease: Secondary | ICD-10-CM | POA: Diagnosis not present

## 2020-09-19 DIAGNOSIS — N2581 Secondary hyperparathyroidism of renal origin: Secondary | ICD-10-CM | POA: Diagnosis not present

## 2020-09-19 DIAGNOSIS — N186 End stage renal disease: Secondary | ICD-10-CM | POA: Diagnosis not present

## 2020-09-19 DIAGNOSIS — Z992 Dependence on renal dialysis: Secondary | ICD-10-CM | POA: Diagnosis not present

## 2020-09-19 DIAGNOSIS — D509 Iron deficiency anemia, unspecified: Secondary | ICD-10-CM | POA: Diagnosis not present

## 2020-09-21 DIAGNOSIS — N186 End stage renal disease: Secondary | ICD-10-CM | POA: Diagnosis not present

## 2020-09-21 DIAGNOSIS — D631 Anemia in chronic kidney disease: Secondary | ICD-10-CM | POA: Diagnosis not present

## 2020-09-21 DIAGNOSIS — Z992 Dependence on renal dialysis: Secondary | ICD-10-CM | POA: Diagnosis not present

## 2020-09-21 DIAGNOSIS — D509 Iron deficiency anemia, unspecified: Secondary | ICD-10-CM | POA: Diagnosis not present

## 2020-09-21 DIAGNOSIS — N2581 Secondary hyperparathyroidism of renal origin: Secondary | ICD-10-CM | POA: Diagnosis not present

## 2020-09-21 DIAGNOSIS — E1129 Type 2 diabetes mellitus with other diabetic kidney complication: Secondary | ICD-10-CM | POA: Diagnosis not present

## 2020-09-24 DIAGNOSIS — N2581 Secondary hyperparathyroidism of renal origin: Secondary | ICD-10-CM | POA: Diagnosis not present

## 2020-09-24 DIAGNOSIS — D631 Anemia in chronic kidney disease: Secondary | ICD-10-CM | POA: Diagnosis not present

## 2020-09-24 DIAGNOSIS — D509 Iron deficiency anemia, unspecified: Secondary | ICD-10-CM | POA: Diagnosis not present

## 2020-09-24 DIAGNOSIS — Z992 Dependence on renal dialysis: Secondary | ICD-10-CM | POA: Diagnosis not present

## 2020-09-24 DIAGNOSIS — N186 End stage renal disease: Secondary | ICD-10-CM | POA: Diagnosis not present

## 2020-09-24 DIAGNOSIS — E1129 Type 2 diabetes mellitus with other diabetic kidney complication: Secondary | ICD-10-CM | POA: Diagnosis not present

## 2020-09-26 DIAGNOSIS — D509 Iron deficiency anemia, unspecified: Secondary | ICD-10-CM | POA: Diagnosis not present

## 2020-09-26 DIAGNOSIS — D631 Anemia in chronic kidney disease: Secondary | ICD-10-CM | POA: Diagnosis not present

## 2020-09-26 DIAGNOSIS — E1129 Type 2 diabetes mellitus with other diabetic kidney complication: Secondary | ICD-10-CM | POA: Diagnosis not present

## 2020-09-26 DIAGNOSIS — Z992 Dependence on renal dialysis: Secondary | ICD-10-CM | POA: Diagnosis not present

## 2020-09-26 DIAGNOSIS — N2581 Secondary hyperparathyroidism of renal origin: Secondary | ICD-10-CM | POA: Diagnosis not present

## 2020-09-26 DIAGNOSIS — N186 End stage renal disease: Secondary | ICD-10-CM | POA: Diagnosis not present

## 2020-09-28 DIAGNOSIS — N186 End stage renal disease: Secondary | ICD-10-CM | POA: Diagnosis not present

## 2020-09-28 DIAGNOSIS — D509 Iron deficiency anemia, unspecified: Secondary | ICD-10-CM | POA: Diagnosis not present

## 2020-09-28 DIAGNOSIS — E1129 Type 2 diabetes mellitus with other diabetic kidney complication: Secondary | ICD-10-CM | POA: Diagnosis not present

## 2020-09-28 DIAGNOSIS — Z992 Dependence on renal dialysis: Secondary | ICD-10-CM | POA: Diagnosis not present

## 2020-09-28 DIAGNOSIS — D631 Anemia in chronic kidney disease: Secondary | ICD-10-CM | POA: Diagnosis not present

## 2020-09-28 DIAGNOSIS — N2581 Secondary hyperparathyroidism of renal origin: Secondary | ICD-10-CM | POA: Diagnosis not present

## 2020-10-01 DIAGNOSIS — Z992 Dependence on renal dialysis: Secondary | ICD-10-CM | POA: Diagnosis not present

## 2020-10-01 DIAGNOSIS — D631 Anemia in chronic kidney disease: Secondary | ICD-10-CM | POA: Diagnosis not present

## 2020-10-01 DIAGNOSIS — N2581 Secondary hyperparathyroidism of renal origin: Secondary | ICD-10-CM | POA: Diagnosis not present

## 2020-10-01 DIAGNOSIS — D509 Iron deficiency anemia, unspecified: Secondary | ICD-10-CM | POA: Diagnosis not present

## 2020-10-01 DIAGNOSIS — N186 End stage renal disease: Secondary | ICD-10-CM | POA: Diagnosis not present

## 2020-10-01 DIAGNOSIS — E1129 Type 2 diabetes mellitus with other diabetic kidney complication: Secondary | ICD-10-CM | POA: Diagnosis not present

## 2020-10-02 ENCOUNTER — Encounter: Payer: Self-pay | Admitting: Internal Medicine

## 2020-10-02 DIAGNOSIS — I129 Hypertensive chronic kidney disease with stage 1 through stage 4 chronic kidney disease, or unspecified chronic kidney disease: Secondary | ICD-10-CM | POA: Diagnosis not present

## 2020-10-02 DIAGNOSIS — N186 End stage renal disease: Secondary | ICD-10-CM | POA: Diagnosis not present

## 2020-10-02 DIAGNOSIS — Z992 Dependence on renal dialysis: Secondary | ICD-10-CM | POA: Diagnosis not present

## 2020-10-03 DIAGNOSIS — E1129 Type 2 diabetes mellitus with other diabetic kidney complication: Secondary | ICD-10-CM | POA: Diagnosis not present

## 2020-10-03 DIAGNOSIS — N2581 Secondary hyperparathyroidism of renal origin: Secondary | ICD-10-CM | POA: Diagnosis not present

## 2020-10-03 DIAGNOSIS — N186 End stage renal disease: Secondary | ICD-10-CM | POA: Diagnosis not present

## 2020-10-03 DIAGNOSIS — D631 Anemia in chronic kidney disease: Secondary | ICD-10-CM | POA: Diagnosis not present

## 2020-10-03 DIAGNOSIS — Z992 Dependence on renal dialysis: Secondary | ICD-10-CM | POA: Diagnosis not present

## 2020-10-03 DIAGNOSIS — D509 Iron deficiency anemia, unspecified: Secondary | ICD-10-CM | POA: Diagnosis not present

## 2020-10-05 DIAGNOSIS — N186 End stage renal disease: Secondary | ICD-10-CM | POA: Diagnosis not present

## 2020-10-05 DIAGNOSIS — N2581 Secondary hyperparathyroidism of renal origin: Secondary | ICD-10-CM | POA: Diagnosis not present

## 2020-10-05 DIAGNOSIS — D631 Anemia in chronic kidney disease: Secondary | ICD-10-CM | POA: Diagnosis not present

## 2020-10-05 DIAGNOSIS — D509 Iron deficiency anemia, unspecified: Secondary | ICD-10-CM | POA: Diagnosis not present

## 2020-10-05 DIAGNOSIS — Z992 Dependence on renal dialysis: Secondary | ICD-10-CM | POA: Diagnosis not present

## 2020-10-05 DIAGNOSIS — E1129 Type 2 diabetes mellitus with other diabetic kidney complication: Secondary | ICD-10-CM | POA: Diagnosis not present

## 2020-10-08 DIAGNOSIS — N2581 Secondary hyperparathyroidism of renal origin: Secondary | ICD-10-CM | POA: Diagnosis not present

## 2020-10-08 DIAGNOSIS — D631 Anemia in chronic kidney disease: Secondary | ICD-10-CM | POA: Diagnosis not present

## 2020-10-08 DIAGNOSIS — D509 Iron deficiency anemia, unspecified: Secondary | ICD-10-CM | POA: Diagnosis not present

## 2020-10-08 DIAGNOSIS — Z992 Dependence on renal dialysis: Secondary | ICD-10-CM | POA: Diagnosis not present

## 2020-10-08 DIAGNOSIS — N186 End stage renal disease: Secondary | ICD-10-CM | POA: Diagnosis not present

## 2020-10-08 DIAGNOSIS — E1129 Type 2 diabetes mellitus with other diabetic kidney complication: Secondary | ICD-10-CM | POA: Diagnosis not present

## 2020-10-10 DIAGNOSIS — E1129 Type 2 diabetes mellitus with other diabetic kidney complication: Secondary | ICD-10-CM | POA: Diagnosis not present

## 2020-10-10 DIAGNOSIS — Z992 Dependence on renal dialysis: Secondary | ICD-10-CM | POA: Diagnosis not present

## 2020-10-10 DIAGNOSIS — D509 Iron deficiency anemia, unspecified: Secondary | ICD-10-CM | POA: Diagnosis not present

## 2020-10-10 DIAGNOSIS — N186 End stage renal disease: Secondary | ICD-10-CM | POA: Diagnosis not present

## 2020-10-10 DIAGNOSIS — N2581 Secondary hyperparathyroidism of renal origin: Secondary | ICD-10-CM | POA: Diagnosis not present

## 2020-10-10 DIAGNOSIS — D631 Anemia in chronic kidney disease: Secondary | ICD-10-CM | POA: Diagnosis not present

## 2020-10-12 DIAGNOSIS — N186 End stage renal disease: Secondary | ICD-10-CM | POA: Diagnosis not present

## 2020-10-12 DIAGNOSIS — N2581 Secondary hyperparathyroidism of renal origin: Secondary | ICD-10-CM | POA: Diagnosis not present

## 2020-10-12 DIAGNOSIS — D509 Iron deficiency anemia, unspecified: Secondary | ICD-10-CM | POA: Diagnosis not present

## 2020-10-12 DIAGNOSIS — E1129 Type 2 diabetes mellitus with other diabetic kidney complication: Secondary | ICD-10-CM | POA: Diagnosis not present

## 2020-10-12 DIAGNOSIS — Z992 Dependence on renal dialysis: Secondary | ICD-10-CM | POA: Diagnosis not present

## 2020-10-12 DIAGNOSIS — D631 Anemia in chronic kidney disease: Secondary | ICD-10-CM | POA: Diagnosis not present

## 2020-10-15 DIAGNOSIS — Z992 Dependence on renal dialysis: Secondary | ICD-10-CM | POA: Diagnosis not present

## 2020-10-15 DIAGNOSIS — N186 End stage renal disease: Secondary | ICD-10-CM | POA: Diagnosis not present

## 2020-10-15 DIAGNOSIS — D631 Anemia in chronic kidney disease: Secondary | ICD-10-CM | POA: Diagnosis not present

## 2020-10-15 DIAGNOSIS — E1129 Type 2 diabetes mellitus with other diabetic kidney complication: Secondary | ICD-10-CM | POA: Diagnosis not present

## 2020-10-15 DIAGNOSIS — N2581 Secondary hyperparathyroidism of renal origin: Secondary | ICD-10-CM | POA: Diagnosis not present

## 2020-10-15 DIAGNOSIS — D509 Iron deficiency anemia, unspecified: Secondary | ICD-10-CM | POA: Diagnosis not present

## 2020-10-16 ENCOUNTER — Encounter: Payer: Self-pay | Admitting: *Deleted

## 2020-10-16 NOTE — Progress Notes (Signed)
Things That May Be Affecting Your Health:  Alcohol  Hearing loss x Pain    Depression  Home Safety  Sexual Health   Diabetes x Lack of physical activity  Stress  x Difficulty with daily activities  Loneliness x Tiredness   Drug use  Medicines  Tobacco use  x Falls  Motor Vehicle Safety  Weight  x Food choices  Oral Health  Other    YOUR PERSONALIZED HEALTH PLAN : 1. Schedule your next subsequent Medicare Wellness visit in one year 2. Attend all of your regular appointments to address your medical issues 3. Complete the preventative screenings and services   Annual Wellness Visit   Medicare Covered Preventative Screenings and [place] Men and Women Who How Often Need? Date of Last Service Action  Abdominal Aortic Aneurysm Adults with AAA risk factors Once      Alcohol Misuse and Counseling All Adults Screening once a year if no alcohol misuse. Counseling up to 4 face to face sessions.     Bone Density Measurement  Adults at risk for osteoporosis Once every 2 yrs      Lipid Panel Z13.6 All adults without CV disease Once every 5 yrs x      Colorectal Cancer   Stool sample or  Colonoscopy All adults 61 and older   Once every year  Every 10 years        Depression All Adults Once a year x Today   Diabetes Screening Blood glucose, post glucose load, or GTT Z13.1  All adults at risk  Pre-diabetics  Once per year  Twice per year x     Diabetes  Self-Management Training All adults Diabetics 10 hrs first year; 2 hours subsequent years. Requires Copay     Glaucoma  Diabetics  Family history of glaucoma  African Americans 73 yrs +  Hispanic Americans 71 yrs + Annually - requires coppay      Hepatitis C Z72.89 or F19.20  High Risk for HCV  Born between 1945 and 1965  Annually  Once      HIV Z11.4 All adults based on risk  Annually btw ages 26 & 52 regardless of risk  Annually > 65 yrs if at increased risk      Lung Cancer Screening  Asymptomatic adults aged 36-77 with 30 pack yr history and current smoker OR quit within the last 15 yrs Annually Must have counseling and shared decision making documentation before first screen      Medical Nutrition Therapy Adults with   Diabetes  Renal disease  Kidney transplant within past 3 yrs 3 hours first year; 2 hours subsequent years     Obesity and Counseling All adults Screening once a year Counseling if BMI 30 or higher  Today   Tobacco Use Counseling Adults who use tobacco  Up to 8 visits in one year     Vaccines Z23  Hepatitis B  Influenza   Pneumonia  Adults   Once  Once every flu season  Two different vaccines separated by one year     Next Annual Wellness Visit People with Medicare Every year  Today     Services & Screenings Women Who How Often Need  Date of Last Service Action  Mammogram  Z12.31 Women over 71 One baseline ages 40-39. Annually ager 40 yrs+ x     Pap tests All women Annually if high risk. Every 2 yrs for normal risk women x  Screening for cervical cancer with   Pap (Z01.419 nl or Z01.411abnl) &  HPV Z11.51 Women aged 64 to 82 Once every 5 yrs x    Screening pelvic and breast exams All women Annually if high risk. Every 2 yrs for normal risk women     Sexually Transmitted Diseases  Chlamydia  Gonorrhea  Syphilis All at risk adults Annually for non pregnant females at increased risk         [place] Men Who How Ofter Need  Date of Last Service Action  Prostate Cancer - DRE & PSA Men over 50 Annually.  DRE might require a copay.        Sexually Transmitted Diseases  Syphilis All at risk adults Annually for men at increased risk      Health Maintenance List Health Maintenance  Topic Date Due  . HEMOGLOBIN A1C  Never done  . Hepatitis C Screening  Never done  . FOOT EXAM  Never done  . OPHTHALMOLOGY EXAM  Never done  . URINE MICROALBUMIN  Never done  . TETANUS/TDAP  Never done  . PAP  SMEAR-Modifier  11/03/2016  . MAMMOGRAM  Never done  . COVID-19 Vaccine (3 - Moderna risk 4-dose series) 11/24/2019  . COLONOSCOPY (Pts 45-72yrs Insurance coverage will need to be confirmed)  02/22/2022  . INFLUENZA VACCINE  Completed  . PNEUMOCOCCAL POLYSACCHARIDE VACCINE AGE 17-64 HIGH RISK  Completed  . HIV Screening  Completed  . HPV VACCINES  Aged Out

## 2020-10-16 NOTE — Progress Notes (Signed)

## 2020-10-17 DIAGNOSIS — N186 End stage renal disease: Secondary | ICD-10-CM | POA: Diagnosis not present

## 2020-10-17 DIAGNOSIS — D509 Iron deficiency anemia, unspecified: Secondary | ICD-10-CM | POA: Diagnosis not present

## 2020-10-17 DIAGNOSIS — N2581 Secondary hyperparathyroidism of renal origin: Secondary | ICD-10-CM | POA: Diagnosis not present

## 2020-10-17 DIAGNOSIS — D631 Anemia in chronic kidney disease: Secondary | ICD-10-CM | POA: Diagnosis not present

## 2020-10-17 DIAGNOSIS — Z992 Dependence on renal dialysis: Secondary | ICD-10-CM | POA: Diagnosis not present

## 2020-10-17 DIAGNOSIS — E1129 Type 2 diabetes mellitus with other diabetic kidney complication: Secondary | ICD-10-CM | POA: Diagnosis not present

## 2020-10-19 DIAGNOSIS — Z992 Dependence on renal dialysis: Secondary | ICD-10-CM | POA: Diagnosis not present

## 2020-10-19 DIAGNOSIS — D631 Anemia in chronic kidney disease: Secondary | ICD-10-CM | POA: Diagnosis not present

## 2020-10-19 DIAGNOSIS — D509 Iron deficiency anemia, unspecified: Secondary | ICD-10-CM | POA: Diagnosis not present

## 2020-10-19 DIAGNOSIS — N2581 Secondary hyperparathyroidism of renal origin: Secondary | ICD-10-CM | POA: Diagnosis not present

## 2020-10-19 DIAGNOSIS — N186 End stage renal disease: Secondary | ICD-10-CM | POA: Diagnosis not present

## 2020-10-19 DIAGNOSIS — E1129 Type 2 diabetes mellitus with other diabetic kidney complication: Secondary | ICD-10-CM | POA: Diagnosis not present

## 2020-10-22 DIAGNOSIS — Z992 Dependence on renal dialysis: Secondary | ICD-10-CM | POA: Diagnosis not present

## 2020-10-22 DIAGNOSIS — D631 Anemia in chronic kidney disease: Secondary | ICD-10-CM | POA: Diagnosis not present

## 2020-10-22 DIAGNOSIS — N2581 Secondary hyperparathyroidism of renal origin: Secondary | ICD-10-CM | POA: Diagnosis not present

## 2020-10-22 DIAGNOSIS — N186 End stage renal disease: Secondary | ICD-10-CM | POA: Diagnosis not present

## 2020-10-22 DIAGNOSIS — D509 Iron deficiency anemia, unspecified: Secondary | ICD-10-CM | POA: Diagnosis not present

## 2020-10-22 DIAGNOSIS — E1129 Type 2 diabetes mellitus with other diabetic kidney complication: Secondary | ICD-10-CM | POA: Diagnosis not present

## 2020-10-24 DIAGNOSIS — D631 Anemia in chronic kidney disease: Secondary | ICD-10-CM | POA: Diagnosis not present

## 2020-10-24 DIAGNOSIS — N186 End stage renal disease: Secondary | ICD-10-CM | POA: Diagnosis not present

## 2020-10-24 DIAGNOSIS — N2581 Secondary hyperparathyroidism of renal origin: Secondary | ICD-10-CM | POA: Diagnosis not present

## 2020-10-24 DIAGNOSIS — E1129 Type 2 diabetes mellitus with other diabetic kidney complication: Secondary | ICD-10-CM | POA: Diagnosis not present

## 2020-10-24 DIAGNOSIS — D509 Iron deficiency anemia, unspecified: Secondary | ICD-10-CM | POA: Diagnosis not present

## 2020-10-24 DIAGNOSIS — Z992 Dependence on renal dialysis: Secondary | ICD-10-CM | POA: Diagnosis not present

## 2020-10-26 DIAGNOSIS — N2581 Secondary hyperparathyroidism of renal origin: Secondary | ICD-10-CM | POA: Diagnosis not present

## 2020-10-26 DIAGNOSIS — E1129 Type 2 diabetes mellitus with other diabetic kidney complication: Secondary | ICD-10-CM | POA: Diagnosis not present

## 2020-10-26 DIAGNOSIS — N186 End stage renal disease: Secondary | ICD-10-CM | POA: Diagnosis not present

## 2020-10-26 DIAGNOSIS — Z992 Dependence on renal dialysis: Secondary | ICD-10-CM | POA: Diagnosis not present

## 2020-10-26 DIAGNOSIS — D509 Iron deficiency anemia, unspecified: Secondary | ICD-10-CM | POA: Diagnosis not present

## 2020-10-26 DIAGNOSIS — D631 Anemia in chronic kidney disease: Secondary | ICD-10-CM | POA: Diagnosis not present

## 2020-10-29 ENCOUNTER — Emergency Department (HOSPITAL_COMMUNITY): Payer: Medicare Other

## 2020-10-29 ENCOUNTER — Inpatient Hospital Stay (HOSPITAL_COMMUNITY): Payer: Medicare Other

## 2020-10-29 ENCOUNTER — Encounter (HOSPITAL_COMMUNITY): Payer: Self-pay

## 2020-10-29 ENCOUNTER — Inpatient Hospital Stay (HOSPITAL_COMMUNITY)
Admission: EM | Admit: 2020-10-29 | Discharge: 2020-11-10 | DRG: 025 | Disposition: A | Discharge status: Home Health | Payer: Medicare Other | Attending: Internal Medicine | Admitting: Internal Medicine

## 2020-10-29 DIAGNOSIS — I10 Essential (primary) hypertension: Secondary | ICD-10-CM | POA: Diagnosis not present

## 2020-10-29 DIAGNOSIS — G936 Cerebral edema: Secondary | ICD-10-CM | POA: Diagnosis not present

## 2020-10-29 DIAGNOSIS — Z833 Family history of diabetes mellitus: Secondary | ICD-10-CM

## 2020-10-29 DIAGNOSIS — D62 Acute posthemorrhagic anemia: Secondary | ICD-10-CM | POA: Diagnosis not present

## 2020-10-29 DIAGNOSIS — E1129 Type 2 diabetes mellitus with other diabetic kidney complication: Secondary | ICD-10-CM | POA: Diagnosis not present

## 2020-10-29 DIAGNOSIS — R34 Anuria and oliguria: Secondary | ICD-10-CM | POA: Diagnosis present

## 2020-10-29 DIAGNOSIS — R0902 Hypoxemia: Secondary | ICD-10-CM

## 2020-10-29 DIAGNOSIS — Z86011 Personal history of benign neoplasm of the brain: Secondary | ICD-10-CM | POA: Diagnosis not present

## 2020-10-29 DIAGNOSIS — F05 Delirium due to known physiological condition: Secondary | ICD-10-CM | POA: Diagnosis not present

## 2020-10-29 DIAGNOSIS — R400 Somnolence: Secondary | ICD-10-CM | POA: Diagnosis not present

## 2020-10-29 DIAGNOSIS — R430 Anosmia: Secondary | ICD-10-CM | POA: Diagnosis not present

## 2020-10-29 DIAGNOSIS — G9389 Other specified disorders of brain: Secondary | ICD-10-CM | POA: Diagnosis not present

## 2020-10-29 DIAGNOSIS — K219 Gastro-esophageal reflux disease without esophagitis: Secondary | ICD-10-CM | POA: Diagnosis present

## 2020-10-29 DIAGNOSIS — R4182 Altered mental status, unspecified: Secondary | ICD-10-CM | POA: Diagnosis not present

## 2020-10-29 DIAGNOSIS — R410 Disorientation, unspecified: Secondary | ICD-10-CM | POA: Diagnosis not present

## 2020-10-29 DIAGNOSIS — J9601 Acute respiratory failure with hypoxia: Secondary | ICD-10-CM | POA: Diagnosis present

## 2020-10-29 DIAGNOSIS — I132 Hypertensive heart and chronic kidney disease with heart failure and with stage 5 chronic kidney disease, or end stage renal disease: Secondary | ICD-10-CM | POA: Diagnosis present

## 2020-10-29 DIAGNOSIS — I509 Heart failure, unspecified: Secondary | ICD-10-CM | POA: Diagnosis present

## 2020-10-29 DIAGNOSIS — Z880 Allergy status to penicillin: Secondary | ICD-10-CM

## 2020-10-29 DIAGNOSIS — Z841 Family history of disorders of kidney and ureter: Secondary | ICD-10-CM

## 2020-10-29 DIAGNOSIS — R110 Nausea: Secondary | ICD-10-CM | POA: Diagnosis not present

## 2020-10-29 DIAGNOSIS — Z992 Dependence on renal dialysis: Secondary | ICD-10-CM | POA: Diagnosis not present

## 2020-10-29 DIAGNOSIS — F17210 Nicotine dependence, cigarettes, uncomplicated: Secondary | ICD-10-CM | POA: Diagnosis present

## 2020-10-29 DIAGNOSIS — Z9115 Patient's noncompliance with renal dialysis: Secondary | ICD-10-CM

## 2020-10-29 DIAGNOSIS — R791 Abnormal coagulation profile: Secondary | ICD-10-CM | POA: Diagnosis present

## 2020-10-29 DIAGNOSIS — E1122 Type 2 diabetes mellitus with diabetic chronic kidney disease: Secondary | ICD-10-CM | POA: Diagnosis present

## 2020-10-29 DIAGNOSIS — D631 Anemia in chronic kidney disease: Secondary | ICD-10-CM | POA: Diagnosis present

## 2020-10-29 DIAGNOSIS — Z452 Encounter for adjustment and management of vascular access device: Secondary | ICD-10-CM

## 2020-10-29 DIAGNOSIS — I5030 Unspecified diastolic (congestive) heart failure: Secondary | ICD-10-CM | POA: Diagnosis not present

## 2020-10-29 DIAGNOSIS — N2581 Secondary hyperparathyroidism of renal origin: Secondary | ICD-10-CM | POA: Diagnosis present

## 2020-10-29 DIAGNOSIS — G9340 Encephalopathy, unspecified: Secondary | ICD-10-CM | POA: Diagnosis present

## 2020-10-29 DIAGNOSIS — F419 Anxiety disorder, unspecified: Secondary | ICD-10-CM | POA: Diagnosis present

## 2020-10-29 DIAGNOSIS — Z825 Family history of asthma and other chronic lower respiratory diseases: Secondary | ICD-10-CM

## 2020-10-29 DIAGNOSIS — D72829 Elevated white blood cell count, unspecified: Secondary | ICD-10-CM | POA: Diagnosis not present

## 2020-10-29 DIAGNOSIS — Z79899 Other long term (current) drug therapy: Secondary | ICD-10-CM | POA: Diagnosis not present

## 2020-10-29 DIAGNOSIS — Z9889 Other specified postprocedural states: Secondary | ICD-10-CM

## 2020-10-29 DIAGNOSIS — T8612 Kidney transplant failure: Secondary | ICD-10-CM | POA: Diagnosis present

## 2020-10-29 DIAGNOSIS — Z20822 Contact with and (suspected) exposure to covid-19: Secondary | ICD-10-CM | POA: Diagnosis present

## 2020-10-29 DIAGNOSIS — R000 Tachycardia, unspecified: Secondary | ICD-10-CM | POA: Diagnosis not present

## 2020-10-29 DIAGNOSIS — E11649 Type 2 diabetes mellitus with hypoglycemia without coma: Secondary | ICD-10-CM | POA: Diagnosis not present

## 2020-10-29 DIAGNOSIS — G939 Disorder of brain, unspecified: Secondary | ICD-10-CM | POA: Diagnosis not present

## 2020-10-29 DIAGNOSIS — I160 Hypertensive urgency: Secondary | ICD-10-CM | POA: Diagnosis not present

## 2020-10-29 DIAGNOSIS — I120 Hypertensive chronic kidney disease with stage 5 chronic kidney disease or end stage renal disease: Secondary | ICD-10-CM | POA: Diagnosis not present

## 2020-10-29 DIAGNOSIS — T380X5A Adverse effect of glucocorticoids and synthetic analogues, initial encounter: Secondary | ICD-10-CM | POA: Diagnosis not present

## 2020-10-29 DIAGNOSIS — R0602 Shortness of breath: Secondary | ICD-10-CM | POA: Diagnosis not present

## 2020-10-29 DIAGNOSIS — N186 End stage renal disease: Secondary | ICD-10-CM

## 2020-10-29 DIAGNOSIS — D320 Benign neoplasm of cerebral meninges: Principal | ICD-10-CM | POA: Diagnosis present

## 2020-10-29 DIAGNOSIS — L299 Pruritus, unspecified: Secondary | ICD-10-CM | POA: Diagnosis present

## 2020-10-29 DIAGNOSIS — Z83438 Family history of other disorder of lipoprotein metabolism and other lipidemia: Secondary | ICD-10-CM

## 2020-10-29 DIAGNOSIS — R059 Cough, unspecified: Secondary | ICD-10-CM | POA: Diagnosis not present

## 2020-10-29 DIAGNOSIS — Z7982 Long term (current) use of aspirin: Secondary | ICD-10-CM

## 2020-10-29 DIAGNOSIS — I129 Hypertensive chronic kidney disease with stage 1 through stage 4 chronic kidney disease, or unspecified chronic kidney disease: Secondary | ICD-10-CM | POA: Diagnosis not present

## 2020-10-29 DIAGNOSIS — I517 Cardiomegaly: Secondary | ICD-10-CM | POA: Diagnosis not present

## 2020-10-29 DIAGNOSIS — R4189 Other symptoms and signs involving cognitive functions and awareness: Secondary | ICD-10-CM | POA: Diagnosis not present

## 2020-10-29 DIAGNOSIS — R531 Weakness: Secondary | ICD-10-CM | POA: Diagnosis not present

## 2020-10-29 DIAGNOSIS — R519 Headache, unspecified: Secondary | ICD-10-CM | POA: Diagnosis not present

## 2020-10-29 DIAGNOSIS — Y830 Surgical operation with transplant of whole organ as the cause of abnormal reaction of the patient, or of later complication, without mention of misadventure at the time of the procedure: Secondary | ICD-10-CM | POA: Diagnosis present

## 2020-10-29 DIAGNOSIS — Z8249 Family history of ischemic heart disease and other diseases of the circulatory system: Secondary | ICD-10-CM | POA: Diagnosis not present

## 2020-10-29 DIAGNOSIS — J189 Pneumonia, unspecified organism: Secondary | ICD-10-CM | POA: Diagnosis present

## 2020-10-29 DIAGNOSIS — R9431 Abnormal electrocardiogram [ECG] [EKG]: Secondary | ICD-10-CM | POA: Diagnosis not present

## 2020-10-29 DIAGNOSIS — R7989 Other specified abnormal findings of blood chemistry: Secondary | ICD-10-CM | POA: Diagnosis present

## 2020-10-29 DIAGNOSIS — Z86018 Personal history of other benign neoplasm: Secondary | ICD-10-CM

## 2020-10-29 DIAGNOSIS — D696 Thrombocytopenia, unspecified: Secondary | ICD-10-CM | POA: Diagnosis present

## 2020-10-29 DIAGNOSIS — J811 Chronic pulmonary edema: Secondary | ICD-10-CM | POA: Diagnosis not present

## 2020-10-29 DIAGNOSIS — E213 Hyperparathyroidism, unspecified: Secondary | ICD-10-CM | POA: Diagnosis present

## 2020-10-29 DIAGNOSIS — D509 Iron deficiency anemia, unspecified: Secondary | ICD-10-CM | POA: Diagnosis not present

## 2020-10-29 DIAGNOSIS — R600 Localized edema: Secondary | ICD-10-CM | POA: Diagnosis not present

## 2020-10-29 LAB — PROCALCITONIN: Procalcitonin: 0.79 ng/mL

## 2020-10-29 LAB — COMPREHENSIVE METABOLIC PANEL
ALT: 20 U/L (ref 0–44)
AST: 30 U/L (ref 15–41)
Albumin: 3.3 g/dL — ABNORMAL LOW (ref 3.5–5.0)
Alkaline Phosphatase: 256 U/L — ABNORMAL HIGH (ref 38–126)
Anion gap: 15 (ref 5–15)
BUN: 42 mg/dL — ABNORMAL HIGH (ref 6–20)
CO2: 27 mmol/L (ref 22–32)
Calcium: 9.4 mg/dL (ref 8.9–10.3)
Chloride: 95 mmol/L — ABNORMAL LOW (ref 98–111)
Creatinine, Ser: 8.27 mg/dL — ABNORMAL HIGH (ref 0.44–1.00)
GFR, Estimated: 5 mL/min — ABNORMAL LOW (ref >60)
Glucose, Bld: 100 mg/dL — ABNORMAL HIGH (ref 70–99)
Potassium: 4.1 mmol/L (ref 3.5–5.1)
Sodium: 137 mmol/L (ref 135–145)
Total Bilirubin: 1.2 mg/dL (ref 0.3–1.2)
Total Protein: 6.7 g/dL (ref 6.5–8.1)

## 2020-10-29 LAB — CBC WITH DIFFERENTIAL/PLATELET
Abs Immature Granulocytes: 0.07 10*3/uL (ref 0.00–0.07)
Basophils Absolute: 0 10*3/uL (ref 0.0–0.1)
Basophils Relative: 0 %
Eosinophils Absolute: 0.1 10*3/uL (ref 0.0–0.5)
Eosinophils Relative: 1 %
HCT: 28.8 % — ABNORMAL LOW (ref 36.0–46.0)
Hemoglobin: 9.2 g/dL — ABNORMAL LOW (ref 12.0–15.0)
Immature Granulocytes: 1 %
Lymphocytes Relative: 6 %
Lymphs Abs: 0.6 10*3/uL — ABNORMAL LOW (ref 0.7–4.0)
MCH: 30.9 pg (ref 26.0–34.0)
MCHC: 31.9 g/dL (ref 30.0–36.0)
MCV: 96.6 fL (ref 80.0–100.0)
Monocytes Absolute: 0.7 10*3/uL (ref 0.1–1.0)
Monocytes Relative: 7 %
Neutro Abs: 8.9 10*3/uL — ABNORMAL HIGH (ref 1.7–7.7)
Neutrophils Relative %: 85 %
Platelets: 137 10*3/uL — ABNORMAL LOW (ref 150–400)
RBC: 2.98 MIL/uL — ABNORMAL LOW (ref 3.87–5.11)
RDW: 16.8 % — ABNORMAL HIGH (ref 11.5–15.5)
WBC: 10.4 10*3/uL (ref 4.0–10.5)
nRBC: 0 % (ref 0.0–0.2)

## 2020-10-29 LAB — SAVE SMEAR(SSMR), FOR PROVIDER SLIDE REVIEW

## 2020-10-29 LAB — RETICULOCYTES
Immature Retic Fract: 11.9 % (ref 2.3–15.9)
RBC.: 2.78 MIL/uL — ABNORMAL LOW (ref 3.87–5.11)
Retic Count, Absolute: 105.1 10*3/uL (ref 19.0–186.0)
Retic Ct Pct: 3.8 % — ABNORMAL HIGH (ref 0.4–3.1)

## 2020-10-29 LAB — RESPIRATORY PANEL BY PCR

## 2020-10-29 LAB — I-STAT BETA HCG BLOOD, ED (MC, WL, AP ONLY): I-stat hCG, quantitative: 10.9 m[IU]/mL — ABNORMAL HIGH (ref <5)

## 2020-10-29 LAB — RESP PANEL BY RT-PCR (FLU A&B, COVID) ARPGX2
Influenza A by PCR: NEGATIVE
Influenza B by PCR: NEGATIVE
SARS Coronavirus 2 by RT PCR: NEGATIVE

## 2020-10-29 LAB — I-STAT VENOUS BLOOD GAS, ED
Acid-Base Excess: 7 mmol/L — ABNORMAL HIGH (ref 0.0–2.0)
Bicarbonate: 31.9 mmol/L — ABNORMAL HIGH (ref 20.0–28.0)
Calcium, Ion: 1.05 mmol/L — ABNORMAL LOW (ref 1.15–1.40)
HCT: 28 % — ABNORMAL LOW (ref 36.0–46.0)
Hemoglobin: 9.5 g/dL — ABNORMAL LOW (ref 12.0–15.0)
O2 Saturation: 94 %
Potassium: 4 mmol/L (ref 3.5–5.1)
Sodium: 136 mmol/L (ref 135–145)
TCO2: 33 mmol/L — ABNORMAL HIGH (ref 22–32)
pCO2, Ven: 45.9 mmHg (ref 44.0–60.0)
pH, Ven: 7.45 — ABNORMAL HIGH (ref 7.250–7.430)
pO2, Ven: 68 mmHg — ABNORMAL HIGH (ref 32.0–45.0)

## 2020-10-29 LAB — BRAIN NATRIURETIC PEPTIDE: B Natriuretic Peptide: 2428.4 pg/mL — ABNORMAL HIGH (ref 0.0–100.0)

## 2020-10-29 LAB — PHOSPHORUS: Phosphorus: 6.7 mg/dL — ABNORMAL HIGH (ref 2.5–4.6)

## 2020-10-29 LAB — LACTIC ACID, PLASMA
Lactic Acid, Venous: 2.4 mmol/L (ref 0.5–1.9)
Lactic Acid, Venous: 1.1 mmol/L (ref 0.5–1.9)

## 2020-10-29 LAB — ETHANOL: Alcohol, Ethyl (B): <10 mg/dL (ref <10)

## 2020-10-29 LAB — TECHNOLOGIST SMEAR REVIEW: Plt Morphology: ADEQUATE

## 2020-10-29 LAB — AMMONIA: Ammonia: 16 umol/L (ref 9–35)

## 2020-10-29 LAB — HIV ANTIBODY (ROUTINE TESTING W REFLEX): HIV Screen 4th Generation wRfx: NONREACTIVE

## 2020-10-29 LAB — MAGNESIUM: Magnesium: 2.7 mg/dL — ABNORMAL HIGH (ref 1.7–2.4)

## 2020-10-29 IMAGING — MR MR HEAD W/O CM
10 of 12 series · 33 of 48 positions shown · non-contrast
Comparison: Head CT [DATE]

CLINICAL DATA: Brain mass

EXAM:
MRI HEAD WITHOUT CONTRAST
TECHNIQUE: Multiplanar, multiecho pulse sequences of the brain and surrounding
structures were obtained without intravenous contrast.

[Series 3: DWI · axial · 3.0mm · 1.09mm/px · z∈[-112,+40]mm · 7 of 104 slices shown (1 of 4)]
[im 1/104]
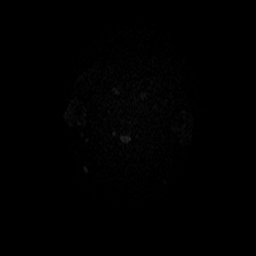
[im 18/104]
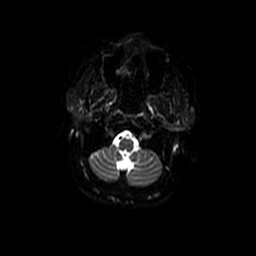
[im 35/104]
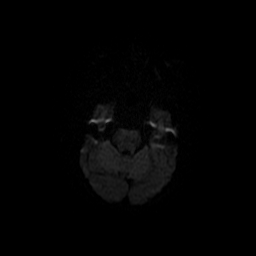
[im 52/104]
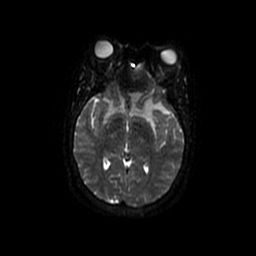
[im 69/104]
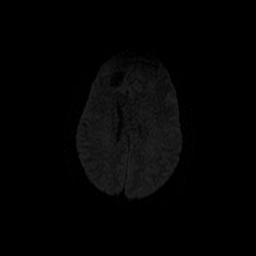
[im 86/104]
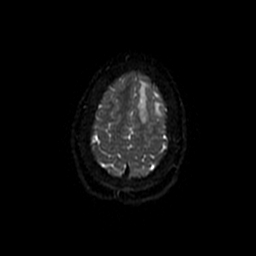
[im 104/104]
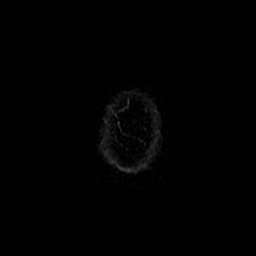

[Series 4: DWI · coronal · 5.0mm · 1.09mm/px · 6 of 76 slices shown (2 of 4)]
[im 1/76]
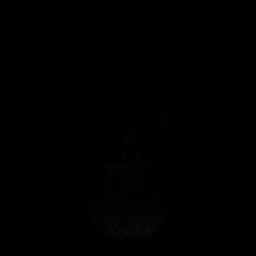
[im 16/76]
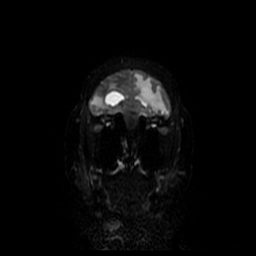
[im 31/76]
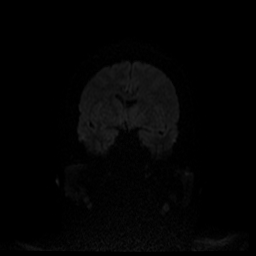
[im 46/76]
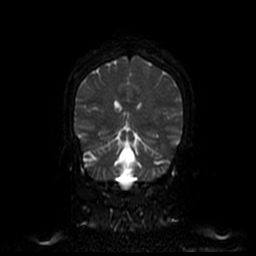
[im 61/76]
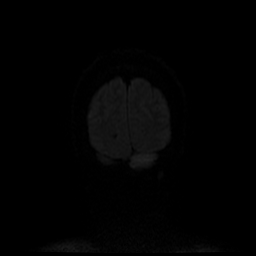
[im 76/76]
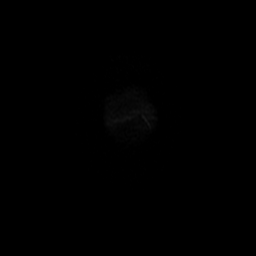

[Series 5: T1 · sagittal · 5.0mm · 0.47mm/px · 2 of 25 slices shown (1 of 2)]
[im 1/25]
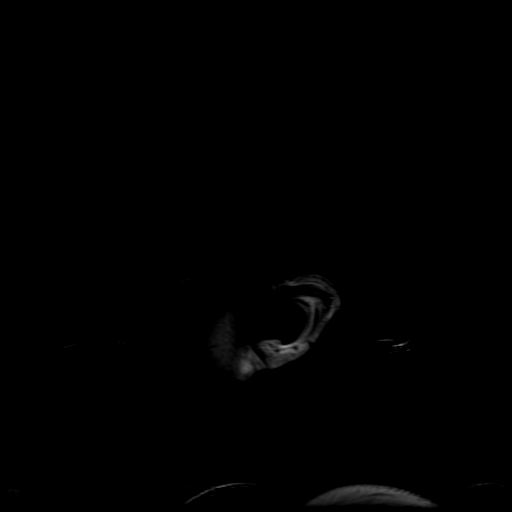
[im 25/25]
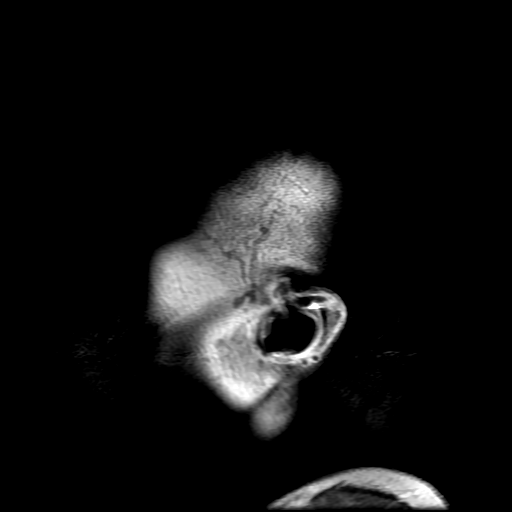

[Series 6: T2 · axial · 5.0mm · 0.43mm/px · z∈[-105,+43]mm · 2 of 26 slices shown]
[im 1/26]
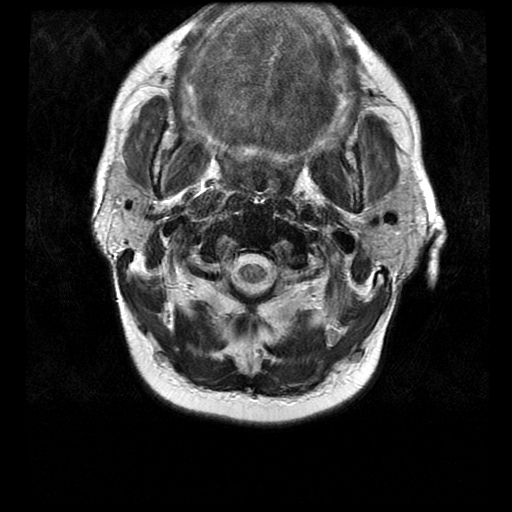
[im 26/26]
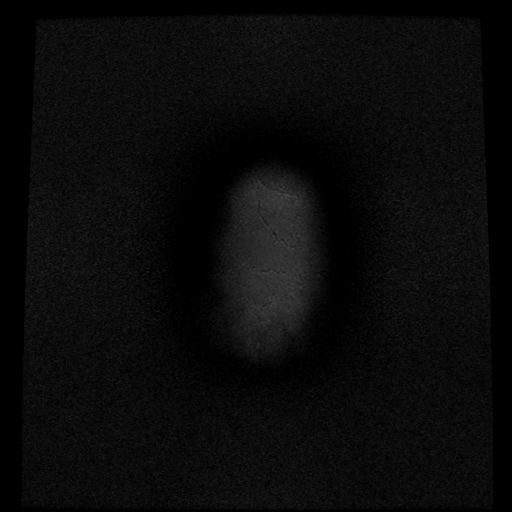

[Series 7: FLAIR · axial · 3.0mm · 0.43mm/px · z∈[-110,+38]mm · 2 of 26 slices shown (1 of 2)]
[im 1/26]
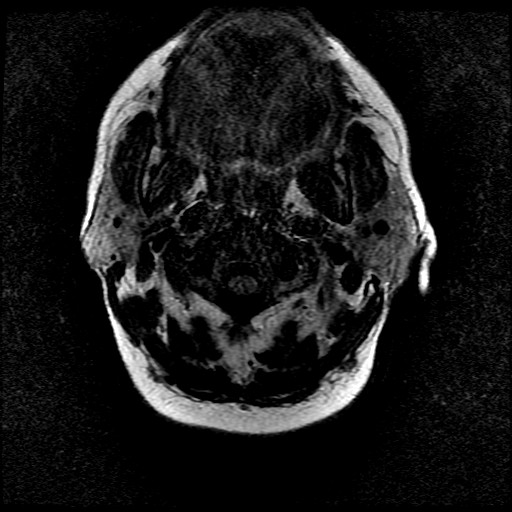
[im 26/26]
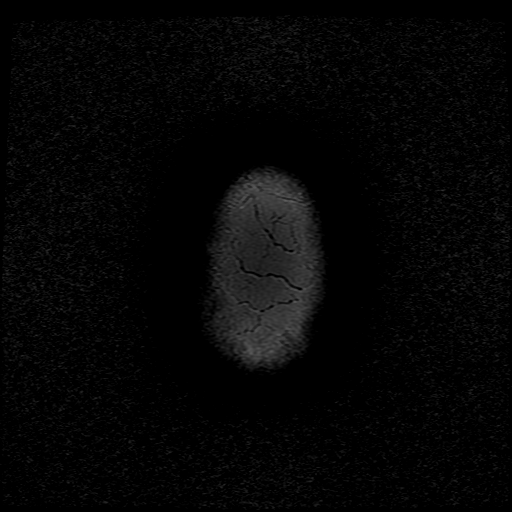

[Series 9: T1 · axial · 3.0mm · 0.47mm/px · z∈[-107,-66]mm · 3 of 100 slices shown (2 of 2)]
[im 1/100]
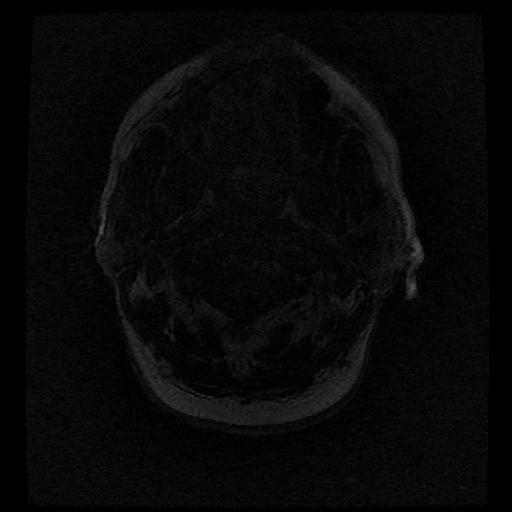
[im 15/100]
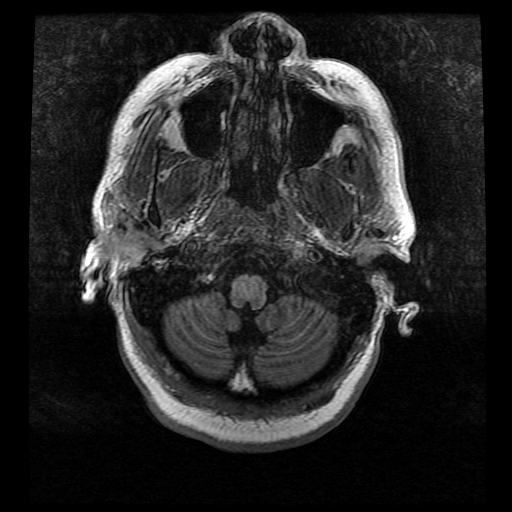
[im 29/100]
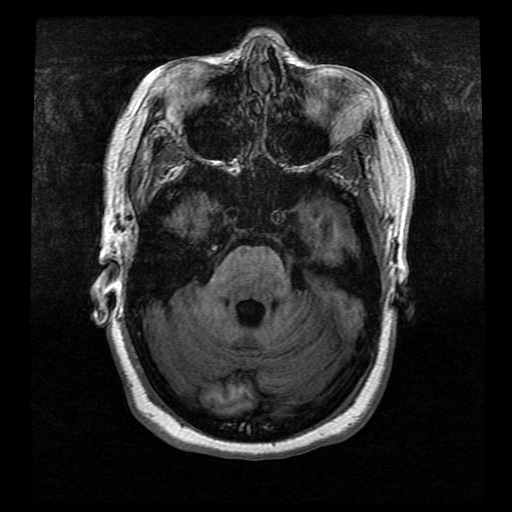

[Series 10: FLAIR · axial · 3.0mm · 0.43mm/px · z∈[-110,+38]mm · 2 of 26 slices shown (2 of 2)]
[im 1/26]
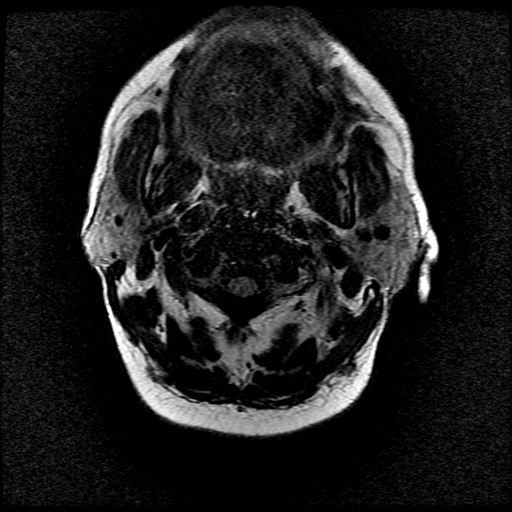
[im 26/26]
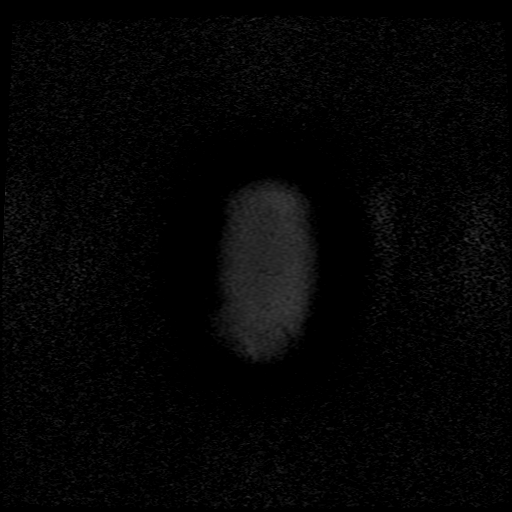

[Series 11: T2 post-contrast · coronal · 5.0mm · 0.39mm/px · 2 of 29 slices shown]
[im 1/29]
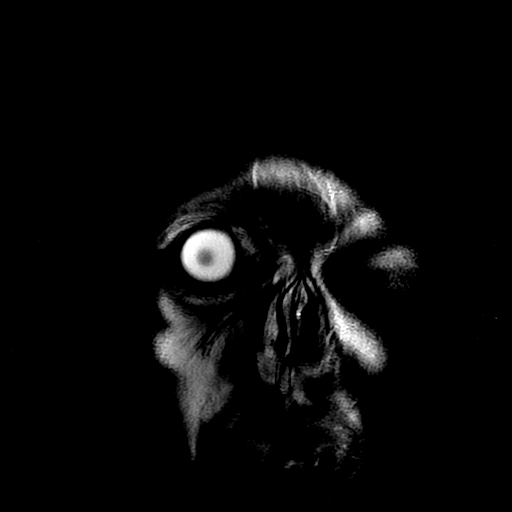
[im 29/29]
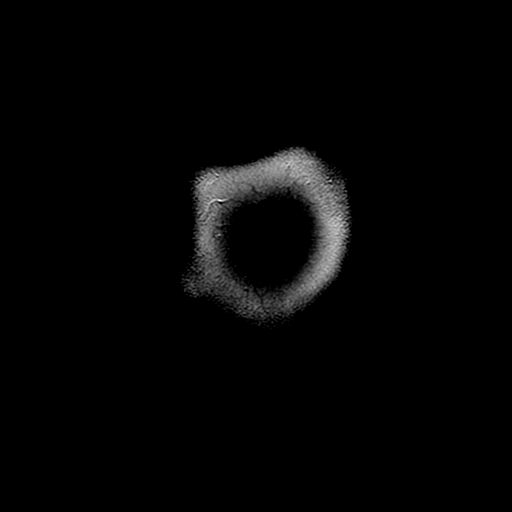

[Series 300: DWI · axial · 3.0mm · 1.09mm/px · z∈[-112,+40]mm · 4 of 52 slices shown (3 of 4)]
[im 1/52]
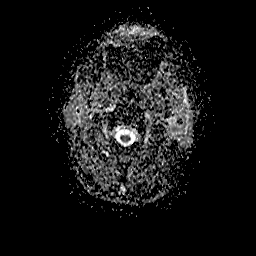
[im 18/52]
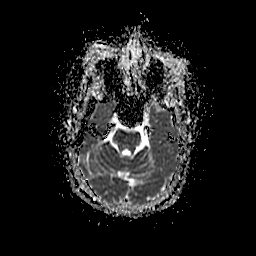
[im 35/52]
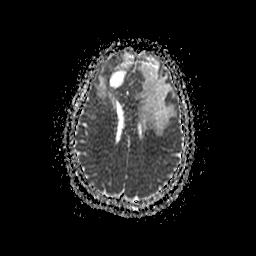
[im 52/52]
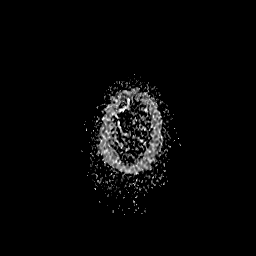

[Series 400: DWI · coronal · 5.0mm · 1.09mm/px · 3 of 38 slices shown (4 of 4)]
[im 1/38]
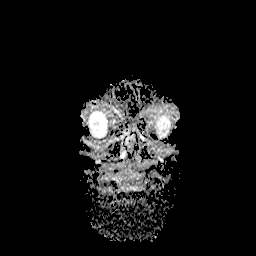
[im 19/38]
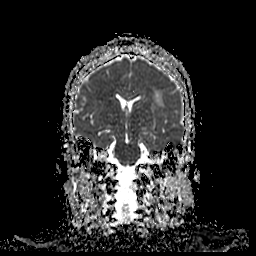
[im 38/38]
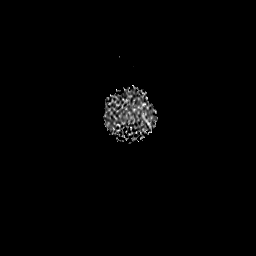

[33 of 48 positions shown; findings below may reference images not displayed]

FINDINGS: Brain: There is an extra-axial mass of the anterior cranial fossa
measuring 4.2 x 3.7 x 2.8 cm. An adjacent cyst measures 2.0 x
cm. There is severe vasogenic edema throughout both frontal lobes,
left worse than right. No hydrocephalus.

Vascular: Major flow voids are preserved.

Skull and upper cervical spine: Normal calvarium and skull base.
Visualized upper cervical spine and soft tissues are normal.

Sinuses/Orbits:No paranasal sinus fluid levels or advanced mucosal
thickening. Bilateral mastoid effusions. Nasopharynx is clear.
Normal orbits.
IMPRESSION: 4.2 x 3.7 x 2.8 cm meningioma of the anterior cranial fossa causing
severe vasogenic edema throughout both frontal lobes, left worse
than right.

## 2020-10-29 IMAGING — DX DG CHEST 1V PORT
1 series · 1 of 1 positions shown · non-contrast
Comparison: [DATE]

CLINICAL DATA: Hypoxia

EXAM:
PORTABLE CHEST 1 VIEW

[chest]
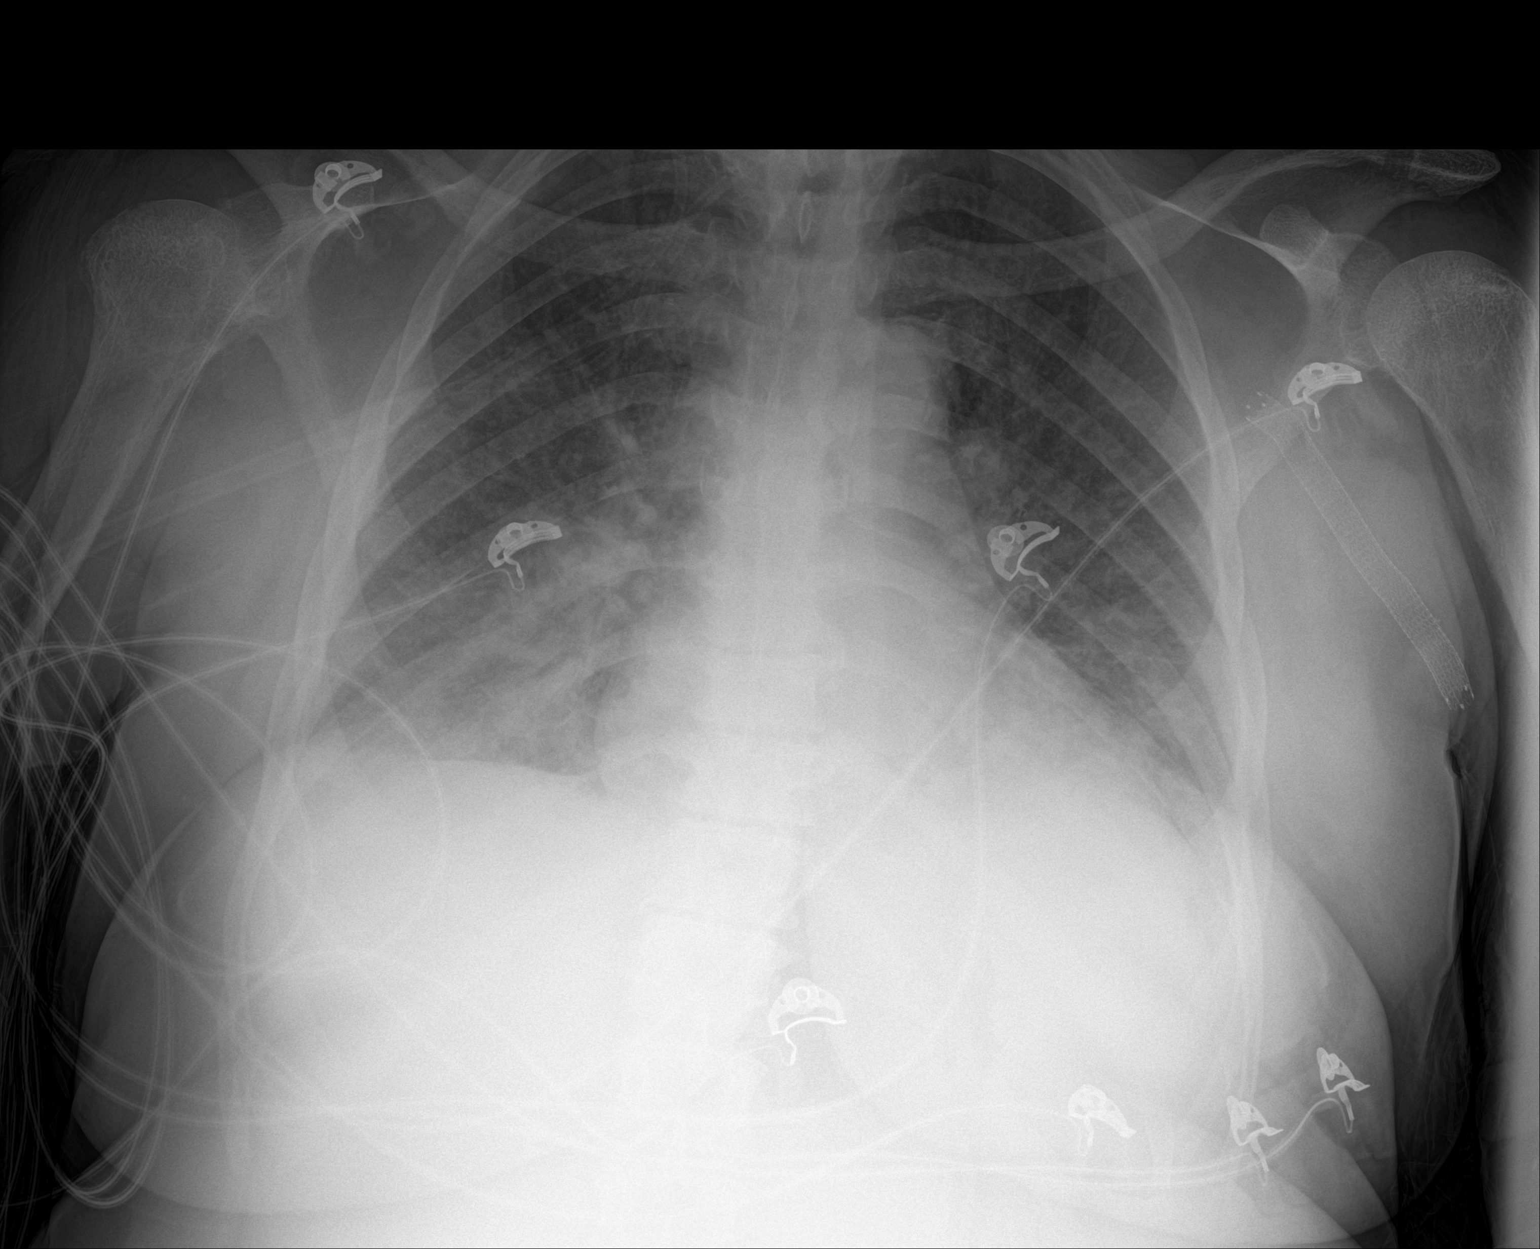

[1 of 1 positions shown; findings below may reference images not displayed]

FINDINGS: Heart is mildly enlarged with mild pulmonary venous hypertension.
There is airspace opacity in each lung base, more on the right than
on the left. There is no appreciable adenopathy. There is a stent in
the left axillary region.
IMPRESSION: 1. Airspace opacity in the lung bases which is concerning for
potential degree of bibasilar pneumonia. Pulmonary edema could
present similarly is a differential consideration. Both pneumonia
and edema may present concurrently.

2. Heart mildly prominent with a degree of pulmonary vascular
congestion.

## 2020-10-29 IMAGING — CT CT HEAD W/O CM
4 of 5 series · 14 of 47 positions shown, 16 images · non-contrast
Comparison: None.

CLINICAL DATA: Delirium.  Worsening generalized weakness.

EXAM:
CT HEAD WITHOUT CONTRAST
TECHNIQUE: Contiguous axial images were obtained from the base of the skull
through the vertex without intravenous contrast.

[Series 3: head wo · axial · 0.42mm/px · z∈[-271,-211]mm · 3 of 31 slices shown]
[im 7/31  brain]
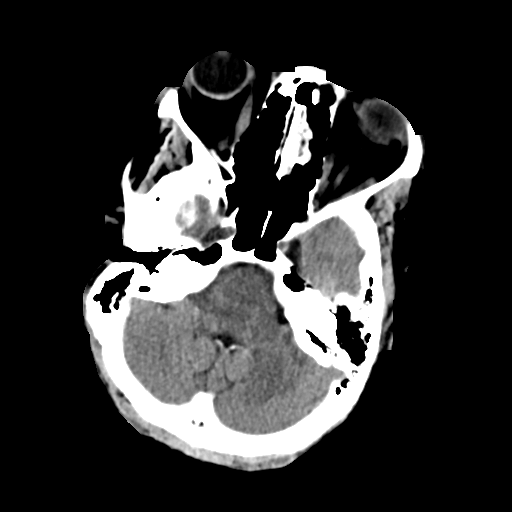
[im 13/31  brain]
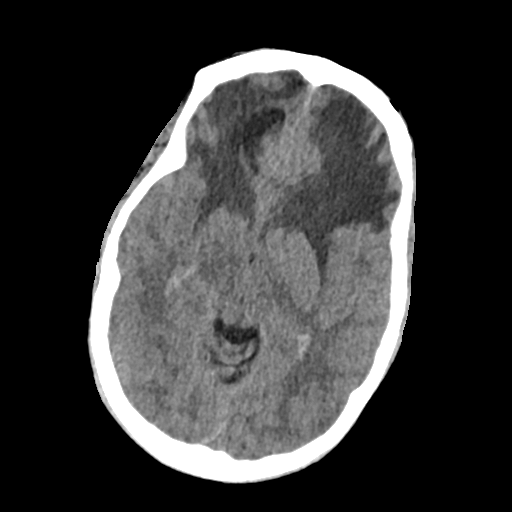
[im 19/31  brain]
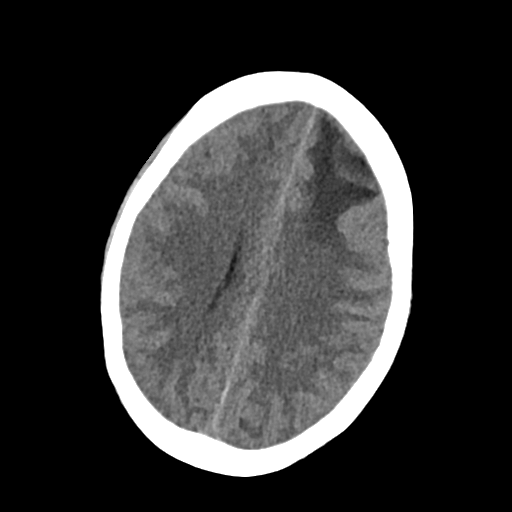

[Series 4: cor soft · coronal · 0.33mm/px · 3 of 68 slices shown]
[im 23/68  brain]
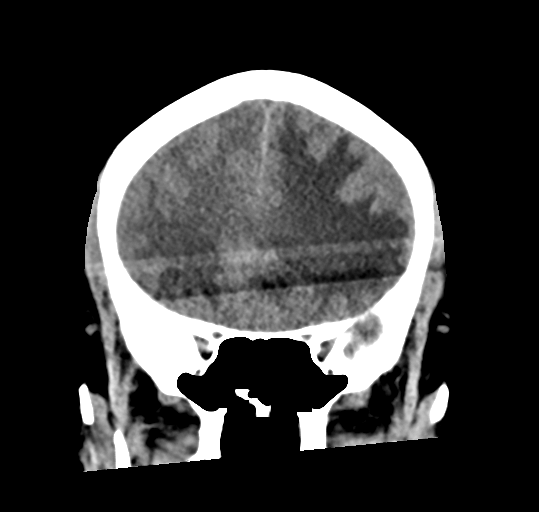
[im 30/68  brain]
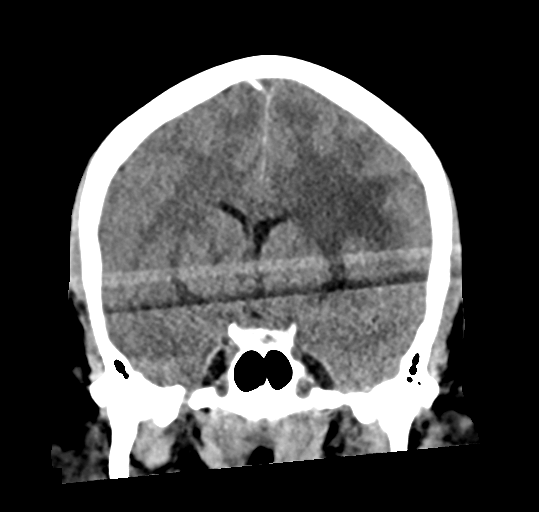
[im 38/68  brain]
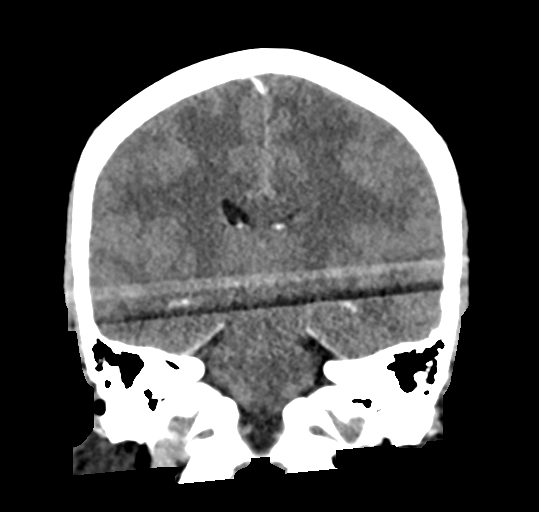

[Series 5: sag soft · sagittal · 0.31mm/px · 3 of 50 slices shown]
[im 17/50  brain]
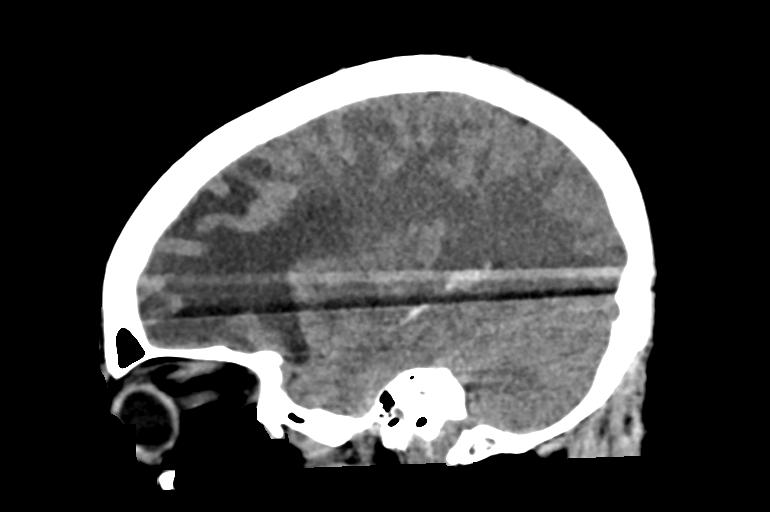
[im 25/50  brain]
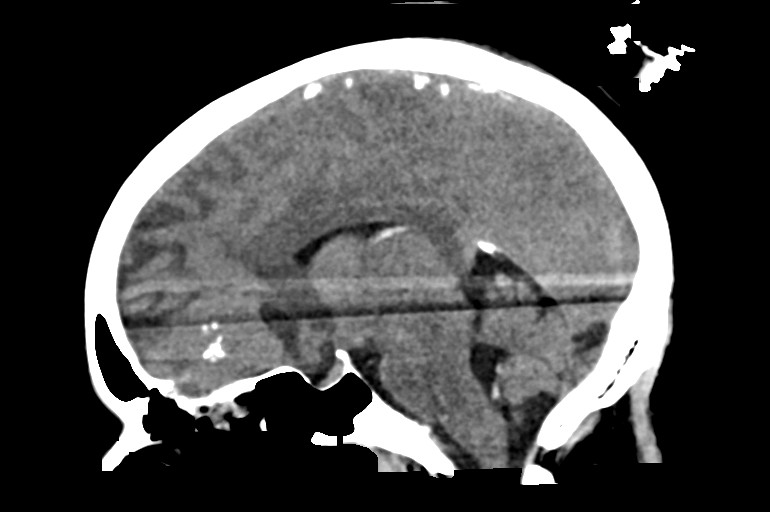
[im 33/50  brain]
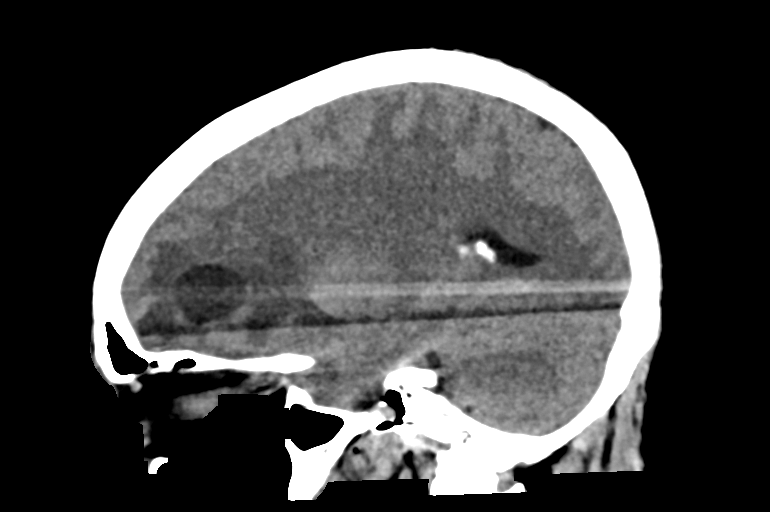

[Series 6: axial · axial · 0.39mm/px · z∈[-273,-173]mm · 5 of 31 slices shown, 7 images]
[im 6/31  brain]
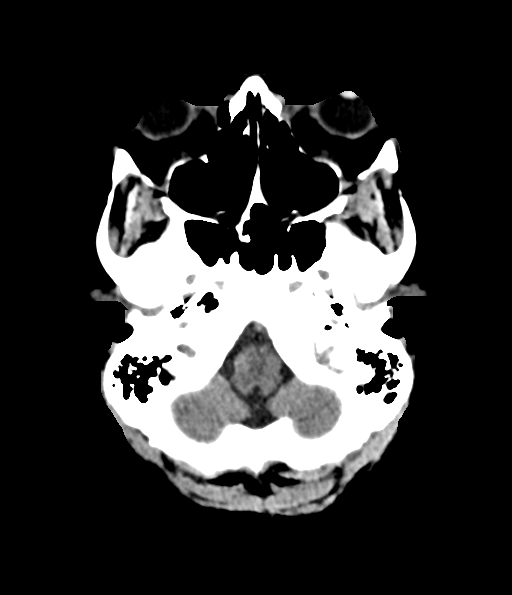
[im 6/31  bone]
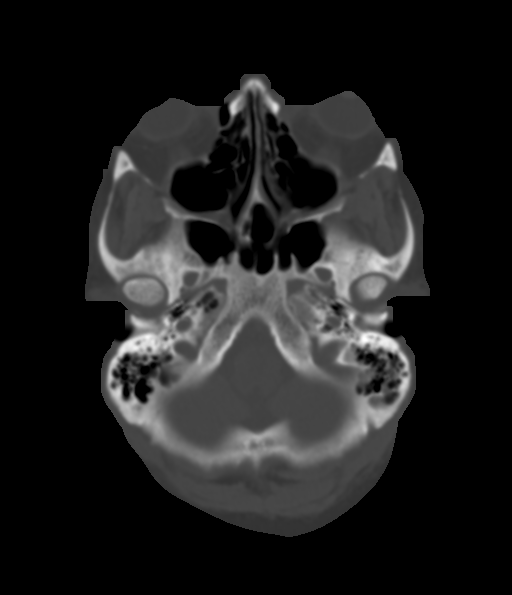
[im 11/31  brain]
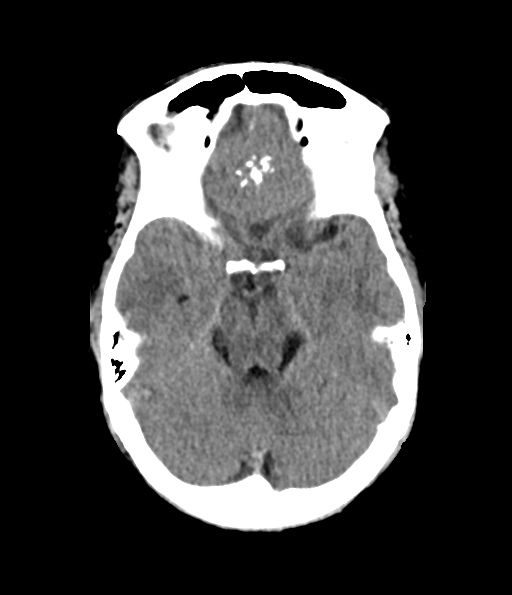
[im 16/31  brain]
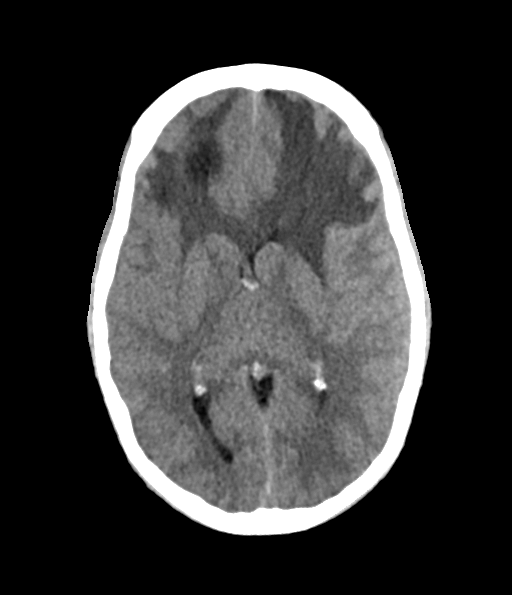
[im 21/31  brain]
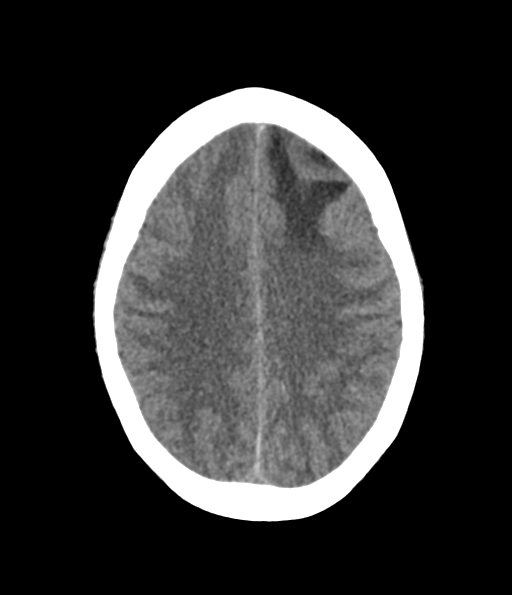
[im 26/31  brain]
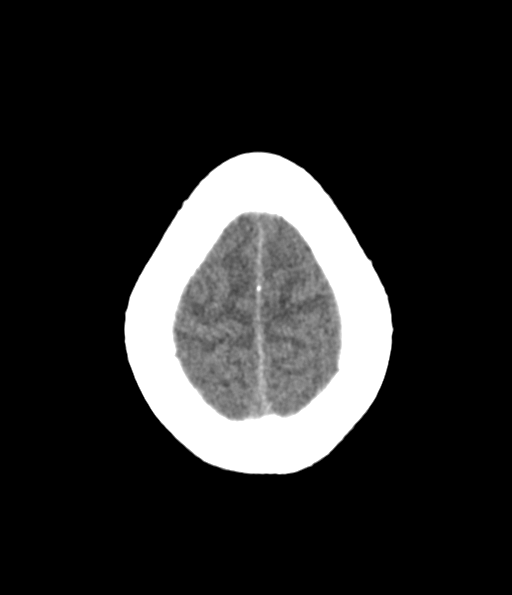
[im 26/31  bone]
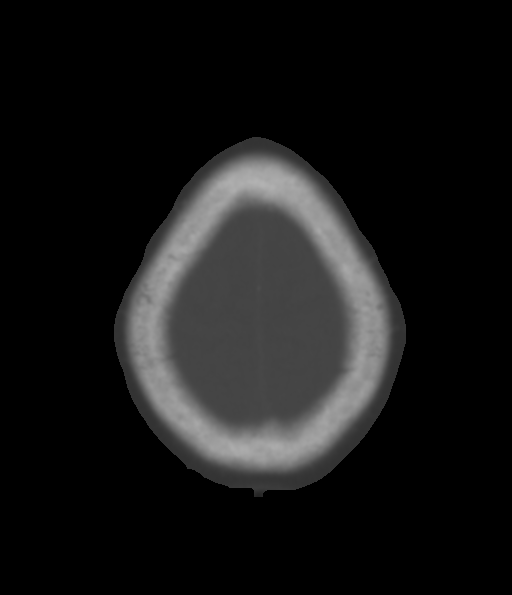

[14 of 47 positions shown; findings below may reference images not displayed]

FINDINGS: Brain: Isodense mass with speckled calcifications centered on the
cribriform plate and measuring up to 4 x 3.6 x 2.5 cm. The mass has
a dural-based epicenter without visible hyperostosis or transcranial
growth. There is marked vasogenic edematous appearance of the
bilateral frontal lobes with an adjacent cystic component between
the mass in the right frontal lobe measuring up to 2.8 cm. The mass
is most likely meningioma; no chart history of malignancy. Given the
degree of brain edema atypical variants are considered. No
hydrocephalus or herniation but there is diffuse effacement of both
supratentorial sulci. No hemorrhage.

Vascular: Negative

Skull: Negative

Sinuses/Orbits: Negative

Currently attempting call report.
IMPRESSION: Large mass centered on the cribriform plate which is most likely a
meningioma with peritumoral cyst. There is marked adjacent vasogenic
edema with signs of elevated intracranial pressure in the
supratentorial space. Atypical variants are considered given the
degree brain edema. Recommend MRI with contrast.

## 2020-10-29 MED ORDER — ONDANSETRON HCL 4 MG/2ML IJ SOLN
4.0000 mg | Freq: Once | INTRAMUSCULAR | Status: AC
  Administered 2020-10-29: 4 mg via INTRAVENOUS
  Filled 2020-10-29: qty 2

## 2020-10-29 MED ORDER — CHLORHEXIDINE GLUCONATE CLOTH 2 % EX PADS
6.0000 | MEDICATED_PAD | Freq: Every day | CUTANEOUS | Status: DC
  Administered 2020-10-30 – 2020-11-01 (×3): 6 via TOPICAL

## 2020-10-29 MED ORDER — LABETALOL HCL 5 MG/ML IV SOLN
5.0000 mg | INTRAVENOUS | Status: DC | PRN
  Administered 2020-10-30 (×2): 5 mg via INTRAVENOUS
  Filled 2020-10-29 (×2): qty 4

## 2020-10-29 MED ORDER — DOXERCALCIFEROL 2.5 MCG PO CAPS
5.0000 ug | ORAL_CAPSULE | ORAL | Status: DC
  Administered 2020-10-31 – 2020-11-09 (×4): 5 ug via ORAL
  Filled 2020-10-29 (×6): qty 2

## 2020-10-29 MED ORDER — DIPHENHYDRAMINE HCL 50 MG/ML IJ SOLN
12.5000 mg | Freq: Once | INTRAMUSCULAR | Status: AC
  Administered 2020-10-29: 12.5 mg via INTRAVENOUS
  Filled 2020-10-29: qty 1

## 2020-10-29 MED ORDER — CINACALCET HCL 30 MG PO TABS
180.0000 mg | ORAL_TABLET | ORAL | Status: DC
Start: 2020-10-31 — End: 2020-11-10
  Administered 2020-10-31 – 2020-11-09 (×3): 180 mg via ORAL
  Filled 2020-10-29 (×6): qty 6

## 2020-10-29 MED ORDER — DEXAMETHASONE SODIUM PHOSPHATE 4 MG/ML IJ SOLN
4.0000 mg | Freq: Three times a day (TID) | INTRAMUSCULAR | Status: DC
  Administered 2020-10-29 – 2020-11-05 (×19): 4 mg via INTRAVENOUS
  Filled 2020-10-29 (×19): qty 1

## 2020-10-29 MED ORDER — ACETAMINOPHEN 325 MG PO TABS
650.0000 mg | ORAL_TABLET | Freq: Four times a day (QID) | ORAL | Status: DC | PRN
  Administered 2020-11-02 – 2020-11-07 (×2): 650 mg via ORAL
  Filled 2020-10-29 (×2): qty 2

## 2020-10-29 MED ORDER — ACETAMINOPHEN 650 MG RE SUPP: 650.0000 mg | Freq: Four times a day (QID) | RECTAL | Status: DC | PRN

## 2020-10-29 MED ORDER — VANCOMYCIN HCL 1500 MG/300ML IV SOLN
1500.0000 mg | Freq: Once | INTRAVENOUS | Status: AC
  Administered 2020-10-29: 1500 mg via INTRAVENOUS
  Filled 2020-10-29: qty 300

## 2020-10-29 MED ORDER — SODIUM CHLORIDE 0.9 % IV SOLN
1.0000 g | INTRAVENOUS | Status: DC
  Administered 2020-10-29: 1 g via INTRAVENOUS
  Filled 2020-10-29 (×2): qty 1

## 2020-10-29 NOTE — ED Notes (Signed)
Patient transported to Imaging.

## 2020-10-29 NOTE — ED Notes (Signed)
Report from previous shift RN at this time. 

## 2020-10-29 NOTE — ED Notes (Signed)
Dressing changed at this time. No bleeding noted at site at this time. Patient awake and active in wound dressing change.

## 2020-10-29 NOTE — ED Notes (Signed)
Upon pts arrival back from CT, fistula actively bleeding. Pressure applied, MD at bedside. Clamp replaced after gauge taped to area. Nurse to monitor closely.

## 2020-10-29 NOTE — ED Notes (Signed)
MD at bedside. 

## 2020-10-29 NOTE — ED Notes (Signed)
Returned from MRI at this time.

## 2020-10-29 NOTE — Consult Note (Signed)
Reason for Consult: Large frontal tumor with obtundation Referring Physician: Dr. Dixon [name] is an 55 y.o. female.  HPI: Ruth Gutierrez is a 55 year old right-handed individual who has had severe hypertension.  She has had renal failure secondary to this process is on chronic hemodialysis.  During hemodialysis it was noted that she became unresponsive.  Hemodialysis was stopped and she was brought to the emergency department where CT scan demonstrates presence of a large lesion in the frontal lobes based on the falx.  This appears to be consistent with a meningioma and creates substantial vasogenic edema in addition to a cystic structure on the left side.  Lesion itself measures about 4-1/2 cm in diameter.  Is most likely consistent with a meningioma as portions of it are calcified.  The patient is scheduled to have an MRI of the brain.  Had been consulted by phone and suggested starting some Decadron 4 mg every 8 hours.  At the current time patient will answer simple questions and follow commands.  When left alone she is sleeping.  Past Medical History:  Diagnosis Date  . Anemia of chronic disease   . Arthritis   . Deceased-donor kidney transplant    Performed at [facility], [facility] 2010.  Initial ESRD due to HTN nephropathy  . Eczema   . ESRD (end stage renal disease) ([place])    s/p transplant creatinine baseline 1.1  M/W/F dialysis  . FUO (fever of unknown origin) 05/17/2015  . GERD (gastroesophageal reflux disease)   . Headache(784.0)   . History of hyperparathyroidism   . Hypertension   . Peritonitis ([place]) 10/2019  . Shortness of breath   . Wears glasses     Past Surgical History:  Procedure Laterality Date  . A/V FISTULAGRAM Left 02/12/2017   Procedure: A/V Fistulagram;  Surgeon: Algernon Huxley, MD;  Location: [place] [facility];  Service: Cardiovascular;  Laterality: Left;  . A/V FISTULAGRAM Left 12/30/2017   Procedure: A/V FISTULAGRAM;  Surgeon: Algernon Huxley, MD;   Location: [place] [facility];  Service: Cardiovascular;  Laterality: Left;  . A/V SHUNT INTERVENTION N/A 02/12/2017   Procedure: A/V Shunt Intervention;  Surgeon: Algernon Huxley, MD;  Location: [place] [facility];  Service: Cardiovascular;  Laterality: N/A;  . AV FISTULA PLACEMENT    . BASCILIC VEIN TRANSPOSITION Left 10/30/2014   Procedure: LEFT BASCILIC VEIN TRANSPOSITION;  Surgeon: Rosetta Posner, MD;  Location: [place];  Service: Vascular;  Laterality: Left;  . BASCILIC VEIN TRANSPOSITION Left 01/03/2015   Procedure: LEFT ARM 2ND STAGE BASCILIC VEIN TRANSPOSITION;  Surgeon: Rosetta Posner, MD;  Location: [place];  Service: Vascular;  Laterality: Left;  . CAPD REMOVAL N/A 10/31/2019   Procedure: PERITONEAL DIALYSIS  (CAPD) INFECTED CATHETER REMOVAL;  Surgeon: Coralie Keens, MD;  Location: [place];  Service: General;  Laterality: N/A;  . FRACTURE SURGERY     left foot,baby toe nad next toe missing  . INSERTION OF DIALYSIS CATHETER Right 10/30/2014   Procedure: INSERTION OF DIALYSIS CATHETER;  Surgeon: Rosetta Posner, MD;  Location: [place];  Service: Vascular;  Laterality: Right;  . KIDNEY TRANSPLANT  11/2008   [facility])  . WISDOM TOOTH EXTRACTION      Family History  Problem Relation Age of Onset  . Hypertension Mother   . Hypertension Father   . Diabetes Brother   . Deep vein thrombosis Brother   . Hypertension Sister   . Hyperlipidemia Sister   . Kidney  disease Brother        on HD  . Hypertension Sister   . Colon cancer Neg Hx   . Esophageal cancer Neg Hx   . Rectal cancer Neg Hx     Social History:  reports that she has been smoking cigarettes. She has a 13.50 pack-year smoking history. She has never used smokeless tobacco. She reports that she does not drink alcohol and does not use drugs.  Allergies:  Allergies  Allergen Reactions  . Penicillin G Itching and Rash  . Penicillins Itching and Rash    Did it involve swelling of the face/tongue/throat, SOB, or low BP?Y Did  it involve sudden or severe rash/hives, skin peeling, or any reaction on the inside of your mouth or nose? Y Did you need to seek medical attention at a hospital or doctor's office? Y When did it last happen?2016 If all above answers are "NO", may proceed with cephalosporin use.    Medications: I have reviewed the patient's current medications.  Results for orders placed or performed during the hospital encounter of 10/29/20 (from the past 48 hour(s))  Comprehensive metabolic panel     Status: Abnormal   Collection Time: 10/29/20  9:44 AM  Result Value Ref Range   Sodium 137 135 - 145 mmol/L   Potassium 4.1 3.5 - 5.1 mmol/L   Chloride 95 (L) 98 - 111 mmol/L   CO2 27 22 - 32 mmol/L   Glucose, Bld 100 (H) 70 - 99 mg/dL    Comment: Glucose reference range applies only to samples taken after fasting for at least 8 hours.   BUN 42 (H) 6 - 20 mg/dL   Creatinine, Ser 8.27 (H) 0.44 - 1.00 mg/dL   Calcium 9.4 8.9 - 10.3 mg/dL   Total Protein 6.7 6.5 - 8.1 g/dL   Albumin 3.3 (L) 3.5 - 5.0 g/dL   AST 30 15 - 41 U/L   ALT 20 0 - 44 U/L   Alkaline Phosphatase 256 (H) 38 - 126 U/L   Total Bilirubin 1.2 0.3 - 1.2 mg/dL   GFR, Estimated 5 (L) >60 mL/min    Comment: (NOTE) Calculated using the CKD-EPI Creatinine Equation (2021)    Anion gap 15 5 - 15    Comment: Performed at [facility], [place] [address] [place], [place] [address]  Lactic acid, plasma     Status: Abnormal   Collection Time: 10/29/20  9:44 AM  Result Value Ref Range   Lactic Acid, Venous 2.4 (HH) 0.5 - 1.9 mmol/L    Comment: CRITICAL RESULT CALLED TO, READ BACK BY AND VERIFIED WITH: ISRAEL,M RN @1115  ON [id number] BY FLEMINGS Performed at [facility] [address] [place], [place] [address]   Ethanol     Status: None   Collection Time: 10/29/20  9:44 AM  Result Value Ref Range   Alcohol, Ethyl (B) <10 <10 mg/dL    Comment: (NOTE) Lowest detectable limit for serum alcohol is 10 mg/dL.  For medical  purposes only. Performed at [facility], [place] [address], [place], [place] [address]   CBC WITH DIFFERENTIAL     Status: Abnormal   Collection Time: 10/29/20  9:44 AM  Result Value Ref Range   WBC 10.4 4.0 - 10.5 K/uL   RBC 2.98 (L) 3.87 - 5.11 MIL/uL   Hemoglobin 9.2 (L) 12.0 - 15.0 g/dL   HCT 28.8 (L) 36.0 - 46.0 %   MCV 96.6 80.0 - 100.0 fL  MCH 30.9 26.0 - 34.0 pg   MCHC 31.9 30.0 - 36.0 g/dL   RDW 16.8 (H) 11.5 - 15.5 %   Platelets 137 (L) 150 - 400 K/uL   nRBC 0.0 0.0 - 0.2 %   Neutrophils Relative % 85 %   Neutro Abs 8.9 (H) 1.7 - 7.7 K/uL   Lymphocytes Relative 6 %   Lymphs Abs 0.6 (L) 0.7 - 4.0 K/uL   Monocytes Relative 7 %   Monocytes Absolute 0.7 0.1 - 1.0 K/uL   Eosinophils Relative 1 %   Eosinophils Absolute 0.1 0.0 - 0.5 K/uL   Basophils Relative 0 %   Basophils Absolute 0.0 0.0 - 0.1 K/uL   Immature Granulocytes 1 %   Abs Immature Granulocytes 0.07 0.00 - 0.07 K/uL    Comment: Performed at [facility] [address], [place], [place] [address]  Resp Panel by RT-PCR (Flu A&B, Covid) Nasopharyngeal Swab     Status: None   Collection Time: 10/29/20  9:45 AM   Specimen: Nasopharyngeal Swab; Nasopharyngeal(NP) swabs in vial transport medium  Result Value Ref Range   SARS Coronavirus 2 by RT PCR NEGATIVE NEGATIVE    Comment: (NOTE) SARS-CoV-2 target nucleic acids are NOT DETECTED.  The SARS-CoV-2 RNA is generally detectable in upper respiratory specimens during the acute phase of infection. The lowest concentration of SARS-CoV-2 viral copies this assay can detect is 138 copies/mL. A negative result does not preclude SARS-Cov-2 infection and should not be used as the sole basis for treatment or other patient management decisions. A negative result may occur with  improper specimen collection/handling, submission of specimen other than nasopharyngeal swab, presence of viral mutation(s) within the areas targeted by this assay, and inadequate number  of viral copies(<138 copies/mL). A negative result must be combined with clinical observations, patient history, and epidemiological information. The expected result is Negative.  Fact Sheet for Patients:  EntrepreneurPulse.com.au  Fact Sheet for Healthcare Providers:  IncredibleEmployment.be  This test is no t yet approved or cleared by the [place] FDA and  has been authorized for detection and/or diagnosis of SARS-CoV-2 by FDA under an Emergency Use Authorization (EUA). This EUA will remain  in effect (meaning this test can be used) for the duration of the COVID-19 declaration under Section 564(b)(1) of the Act, 21 U.S.C.section 360bbb-3(b)(1), unless the authorization is terminated  or revoked sooner.       Influenza A by PCR NEGATIVE NEGATIVE   Influenza B by PCR NEGATIVE NEGATIVE    Comment: (NOTE) The Xpert Xpress SARS-CoV-2/FLU/RSV plus assay is intended as an aid in the diagnosis of influenza from Nasopharyngeal swab specimens and should not be used as a sole basis for treatment. Nasal washings and aspirates are unacceptable for Xpert Xpress SARS-CoV-2/FLU/RSV testing.  Fact Sheet for Patients: EntrepreneurPulse.com.au  Fact Sheet for Healthcare Providers: IncredibleEmployment.be  This test is not yet approved or cleared by the [place] FDA and has been authorized for detection and/or diagnosis of SARS-CoV-2 by FDA under an Emergency Use Authorization (EUA). This EUA will remain in effect (meaning this test can be used) for the duration of the COVID-19 declaration under Section 564(b)(1) of the Act, 21 U.S.C. section 360bbb-3(b)(1), unless the authorization is terminated or revoked.  Performed at [facility], [place] [address], [place], [place] [address]   I-Stat beta hCG blood, ED     Status: Abnormal   Collection Time: 10/29/20 10:29 AM  Result Value Ref Range   I-stat  hCG,  quantitative 10.9 (H) <5 mIU/mL   Comment 3            Comment:   GEST. AGE      CONC.  (mIU/mL)   <=1 WEEK        5 - 50     2 WEEKS       50 - 500     3 WEEKS       100 - 10,000     4 WEEKS     1,000 - 30,000        FEMALE AND NON-PREGNANT FEMALE:     LESS THAN 5 mIU/mL   I-Stat venous blood gas, ED     Status: Abnormal   Collection Time: 10/29/20 10:31 AM  Result Value Ref Range   pH, Ven 7.450 (H) 7.250 - 7.430   pCO2, Ven 45.9 44.0 - 60.0 mmHg   pO2, Ven 68.0 (H) 32.0 - 45.0 mmHg   Bicarbonate 31.9 (H) 20.0 - 28.0 mmol/L   TCO2 33 (H) 22 - 32 mmol/L   O2 Saturation 94.0 %   Acid-Base Excess 7.0 (H) 0.0 - 2.0 mmol/L   Sodium 136 135 - 145 mmol/L   Potassium 4.0 3.5 - 5.1 mmol/L   Calcium, Ion 1.05 (L) 1.15 - 1.40 mmol/L   HCT 28.0 (L) 36.0 - 46.0 %   Hemoglobin 9.5 (L) 12.0 - 15.0 g/dL   Sample type VENOUS   Ammonia     Status: None   Collection Time: 10/29/20 11:55 AM  Result Value Ref Range   Ammonia 16 9 - 35 umol/L    Comment: Performed at [facility], [place] [address] [place], [place] [address]  Phosphorus     Status: Abnormal   Collection Time: 10/29/20  1:46 PM  Result Value Ref Range   Phosphorus 6.7 (H) 2.5 - 4.6 mg/dL    Comment: Performed at [facility] [address] [place],  [address]    CT HEAD WO CONTRAST  Result Date: 10/29/2020 CLINICAL DATA:  Delirium.  Worsening generalized weakness. EXAM: CT HEAD WITHOUT CONTRAST TECHNIQUE: Contiguous axial images were obtained from the base of the skull through the vertex without intravenous contrast. COMPARISON:  None. FINDINGS: Brain: Isodense mass with speckled calcifications centered on the cribriform plate and measuring up to 4 x 3.6 x 2.5 cm. The mass has a dural-based epicenter without visible hyperostosis or transcranial growth. There is marked vasogenic edematous appearance of the bilateral frontal lobes with an adjacent cystic component between the mass in the right frontal lobe  measuring up to 2.8 cm. The mass is most likely meningioma; no chart history of malignancy. Given the degree of brain edema atypical variants are considered. No hydrocephalus or herniation but there is diffuse effacement of both supratentorial sulci. No hemorrhage. Vascular: Negative Skull: Negative Sinuses/Orbits: Negative Currently attempting call report. IMPRESSION: Large mass centered on the cribriform plate which is most likely a meningioma with peritumoral cyst. There is marked adjacent vasogenic edema with signs of elevated intracranial pressure in the supratentorial space. Atypical variants are considered given the degree brain edema. Recommend MRI with contrast. Electronically Signed   By: Monte Fantasia M.D.   On: 10/29/2020 12:03   DG Chest Port 1 View  Result Date: 10/29/2020 CLINICAL DATA:  Hypoxia EXAM: PORTABLE CHEST 1 VIEW COMPARISON:  October 28, 2019 FINDINGS: Heart is mildly enlarged with mild pulmonary venous hypertension. There is airspace opacity in each lung base, more on  the right than on the left. There is no appreciable adenopathy. There is a stent in the left axillary region. IMPRESSION: 1. Airspace opacity in the lung bases which is concerning for potential degree of bibasilar pneumonia. Pulmonary edema could present similarly is a differential consideration. Both pneumonia and edema may present concurrently. 2. Heart mildly prominent with a degree of pulmonary vascular congestion. Electronically Signed   By: Lowella Grip III M.D.   On: 10/29/2020 10:29    Review of Systems  Constitutional: Positive for activity change.  HENT: Negative.   Eyes: Negative.   Respiratory: Negative.   Cardiovascular: Negative.   Gastrointestinal: Negative.   Endocrine: Negative.   Musculoskeletal: Negative.   Allergic/Immunologic: Negative.   Neurological: Positive for speech difficulty.  Hematological: Negative.   Psychiatric/Behavioral: Negative.    Blood pressure (!) 168/98, pulse  95, temperature 98.8 F (37.1 C), temperature source Oral, resp. rate (!) 21, last menstrual period 06/02/2015, SpO2 96 %. Physical Exam HENT:     Head: Normocephalic and atraumatic.     Left Ear: Tympanic membrane normal.     Nose: Nose normal.     Mouth/Throat:     Mouth: Mucous membranes are moist.  Eyes:     Extraocular Movements: Extraocular movements intact.     Pupils: Pupils are equal, round, and reactive to light.  Pulmonary:     Effort: Pulmonary effort is normal.     Breath sounds: Normal breath sounds.  Abdominal:     General: Abdomen is flat.     Palpations: Abdomen is soft.  Skin:    General: Skin is warm and dry.  Neurological:     Comments: Obtunded individual who responds to verbal stimuli and will open her eyes briefly and follow commands.  She raises her arms and on doing so is evident that she has a left drift that is substantial.  She raises either lower extremity but qualitatively it appears that she has some left lower extremity weakness also.  Her pupils are 3 mm and equally reactive the extraocular movements are full in the face is symmetric to grimace.  Tongue and uvula protruded in the midline.  Neck appears supple.     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  Plan: The patient will require surgical extirpation of this lesion.  I discussed this with her mother who is attending with her and explained that would like to give the Decadron a few days to decrease to the vasogenic edema.  We will be planning the surgery towards the end of the week either Thursday or Friday.  I have asked Dr. Pieter Partridge Dawley to also see and evaluate the patient on my behalf.  Blanchie Dessert Djuan Talton 10/29/2020, 3:50 PM

## 2020-10-29 NOTE — H&P (Addendum)
Date: 10/29/2020               Patient Name:  Ruth Gutierrez MRN: [id number]  DOB: 1965/10/31 Age / Sex: 55 y.o., female   PCP: Pcp, No         Medical Service: Internal Medicine Teaching Service         Attending Physician: Dr. Aldine Contes, MD    First Contact: Sanjuana Letters, DO Pager: Maryjean Morn [phone]  Second Contact: Marianna Payment, DO Pager: [facility] [phone]       After Hours (After 5p/  First Contact Pager: [phone]  weekends / holidays): Second Contact Pager: [phone]   SUBJECTIVE   Chief Complaint: SOB  History of Present Illness:  Patient is a 55 y/o female with PMH significant for ESRD on dialysis (MWF), s/p renal transplant in 2010, failed and was removed ~2017. Patient was at dialysis today and began feeling very sleepy and was reported to not be responding to staff. She states she was not able to complete dialysis today and was transported to ED. Patient states she has had shortness of breath for the past few days, which is worse with dialysis. She has been having a productive cough with clear sputum during the same period. She states she has been feeling hot, but no fever. She has also felt "weak all over". He mother reports she has been more lethargic than normal and has not been at her baseline for the last few weeks.  She also reports severe headaches over the last few months. She describes them as occurring every other day and localized in frontal region. She takes ibprofen 800 mg which helps a lot. She denies photophobia, dizziness, ataxia, recent falls, nausea and vomiting. She states her memory has been worsening recently.  She denies missing dialysis recently.   She reports her eyelids have been puffy, which happens occasionally. She states that sometimes she will have swelling "everywhere", but denies recently. She says ESRD is due to HTN, she reports she takes amlodipine 10 mg at home, but only takes it ~5 days a week. She does not make her own urine.   Patient  denies change in vision, CP, abdominal pain, change in bowel, dark tarry stools. Endorses pain in right shoulder, and occasional GERD.    ED Course: Patient hypoxic spO2 of 89%, given O2 2L vis nasal canula corrected to 97%. BP was 244/101 on arrival. Patient had bleeding form dialysis port in left arm, clamp in place, now hemostatic.   Alk phos elevated at 256, phosphorus 6.7,  lactic acid 2.4, Hgb 9.2, respiratory panel was unremarkable.   Patient had bedside CXR concerning for bibasilar pneumonia vs pulmonary edema vs both. "Heart mildly prominent with a degree of vascular congestion". Patient started on cefepine 1g and vancomycin 1500 mg. Patient began getting itchy and nauseas immediatly after vancomycin was started, she had one episode of emesis and vancomycin was stopped. Patient given diphenhydramine and ondansetron and symptoms resolved.   CT head was significant for large mass on cribriform plate with marked vasogenic edema and signs of elevated intercranial pressure. Neurosurgery was consulted, MRI pending and patient started on dexamethasone 4 mg Q8H.    Lab Orders     Urine culture     Resp Panel by RT-PCR (Flu A&B, Covid) Nasopharyngeal Swab     Blood culture (routine x 2)     Respiratory (~20 pathogens) panel by PCR     Comprehensive metabolic panel     Lactic acid, plasma  Ethanol     CBC WITH DIFFERENTIAL     Urinalysis, Routine w reflex microscopic     Ammonia     Phosphorus     Procalcitonin - Baseline     Lactic acid, plasma     Brain natriuretic peptide     HIV Antibody (routine testing w rflx)     Magnesium     Save Smear     Reticulocytes     Pathologist smear review     Technologist smear review     CBG monitoring, ED     I-Stat beta hCG blood, ED     I-Stat venous blood gas, ED   Meds:  Current Meds  Medication Sig  . amLODipine (NORVASC) 5 MG tablet Take 5 mg by mouth daily.  Marland Kitchen aspirin 81 MG EC tablet Take 81 mg by mouth as needed for pain.  .  calcitRIOL (ROCALTROL) 0.25 MCG capsule Take 0.25 mcg by mouth every Monday, Wednesday, and Friday with hemodialysis.  . cholecalciferol (VITAMIN D3) 25 MCG (1000 UNIT) tablet Take 1,000 Units by mouth daily.  . cinacalcet (SENSIPAR) 30 MG tablet Take 90 mg by mouth daily with supper.  . lidocaine-prilocaine (EMLA) cream Apply 1 application topically every Monday, Wednesday, and Friday with hemodialysis.  . Methoxy PEG-Epoetin Beta (MIRCERA IJ) Mircera  . omeprazole (PRILOSEC OTC) 20 MG tablet Take 20 mg by mouth daily as needed for heartburn.  . polyethylene glycol (MIRALAX / GLYCOLAX) 17 g packet Take 17 g by mouth daily.  . potassium chloride SA (KLOR-CON) 20 MEQ tablet Take 2 tablets (40 mEq total) by mouth daily. (Patient taking differently: Take 40 mEq by mouth every Monday, Wednesday, and Friday with hemodialysis.)  . sucroferric oxyhydroxide (VELPHORO) 500 MG chewable tablet Chew 1,500 mg by mouth 3 (three) times daily with meals. 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  PCP: IMTS in the past Substances:   Denies ETOH   Cigarettes - 1/2 PPD    Family History: Mother - migraine headaches, asthma, HTN  Father - uncertain 2 Sisters - unclear PMHx 1 Brother - deceased - ESRD No family history of cancer  Allergies: Allergies as of 10/29/2020 - Review Complete 10/29/2020  Allergen Reaction Noted  . Penicillin g Itching and Rash 11/12/2015  . Penicillins Itching and Rash 02/02/2013   Past Medical History:  Diagnosis Date  . Anemia of chronic disease   . Arthritis   . Deceased-donor kidney transplant    Performed at [facility], [facility].  Initial ESRD due to HTN nephropathy  . Eczema   . ESRD (end stage renal disease) ([place])    s/p  transplant creatinine baseline 1.1  M/W/F dialysis  . FUO (fever of unknown origin) 05/17/2015  . GERD (gastroesophageal reflux disease)   . Headache(784.0)   . History of hyperparathyroidism   . Hypertension   . Peritonitis ([place]) 10/2019  . Shortness of breath   . Wears glasses    PMHx  HTN - (2x a week misses medicine) ESRD - Dialysis MWF  PSHx Kidney transplant 2010 Toe amputation during childhood, traumatic injury   LNMP - 2016 G1P1A1  Review of Systems: A complete ROS was negative except as per HPI.   OBJECTIVE:   Physical  Exam: Blood pressure (!) 168/98, pulse 95, temperature 98.8 F (37.1 C), temperature source Oral, resp. rate (!) 21, last menstrual period 06/02/2015, SpO2 96 %.  Constitutional: appears older than stated age, sitting in bed, in no acute distress HENT: normocephalic atraumatic, mucous membranes moist Eyes: conjunctiva non-erythematous, bilateral periorbital edema, sclera anicteric  Neck: supple Cardiovascular: regular rate and rhythm, no m/r/g Pulmonary/Chest: normal work of breathing on 2L Leonidas, coarse breath sounds with crackles bilateral  Abdominal: soft, non-tender, non-distended, normal active bowel sounds MSK: normal bulk and tone Neurological: alert & oriented x 3, 5/5 strength in bilateral upper and lower extremities, CN II-XII grossly intact  Skin: warm and dry Psych: appropriate mood and affact    Labs: CBC    Component Value Date/Time   WBC 10.4 10/29/2020 0944   RBC 2.98 (L) 10/29/2020 0944   HGB 9.5 (L) 10/29/2020 1031   HGB 8.9 (L) 11/08/2019 0940   HCT 28.0 (L) 10/29/2020 1031   HCT 26.4 (L) 11/08/2019 0940   PLT 137 (L) 10/29/2020 0944   PLT 223 11/08/2019 0940   MCV 96.6 10/29/2020 0944   MCV 96 11/08/2019 0940   MCH 30.9 10/29/2020 0944   MCHC 31.9 10/29/2020 0944   RDW 16.8 (H) 10/29/2020 0944   RDW 13.2 11/08/2019 0940   LYMPHSABS 0.6 (L) 10/29/2020 0944   LYMPHSABS 0.8 11/08/2019 0940   MONOABS 0.7 10/29/2020  0944   EOSABS 0.1 10/29/2020 0944   EOSABS 0.1 11/08/2019 0940   BASOSABS 0.0 10/29/2020 0944   BASOSABS 0.0 11/08/2019 0940     CMP     Component Value Date/Time   NA 136 10/29/2020 1031   NA 142 11/08/2019 0940   K 4.0 10/29/2020 1031   CL 95 (L) 10/29/2020 0944   CO2 27 10/29/2020 0944   GLUCOSE 100 (H) 10/29/2020 0944   BUN 42 (H) 10/29/2020 0944   BUN 16 11/08/2019 0940   CREATININE 8.27 (H) 10/29/2020 0944   CREATININE 1.47 (H) 04/14/2013 1149   CALCIUM 9.4 10/29/2020 0944   CALCIUM 7.2 (L) 01/13/2007 0830   PROT 6.7 10/29/2020 0944   ALBUMIN 3.3 (L) 10/29/2020 0944   AST 30 10/29/2020 0944   ALT 20 10/29/2020 0944   ALKPHOS 256 (H) 10/29/2020 0944   BILITOT 1.2 10/29/2020 0944   GFRNONAA 5 (L) 10/29/2020 0944   GFRNONAA 43 (L) 04/14/2013 1149   GFRAA 9 (L) 11/08/2019 0940   GFRAA 49 (L) 04/14/2013 1149    Imaging: CT HEAD WO CONTRAST  Result Date: 10/29/2020 CLINICAL DATA:  Delirium.  Worsening generalized weakness. EXAM: CT HEAD WITHOUT CONTRAST TECHNIQUE: Contiguous axial images were obtained from the base of the skull through the vertex without intravenous contrast. COMPARISON:  None. FINDINGS: Brain: Isodense mass with speckled calcifications centered on the cribriform plate and measuring up to 4 x 3.6 x 2.5 cm. The mass has a dural-based epicenter without visible hyperostosis or transcranial growth. There is marked vasogenic edematous appearance of the bilateral frontal lobes with an adjacent cystic component between the mass in the right frontal lobe measuring up to 2.8 cm. The mass is most likely meningioma; no chart history of malignancy. Given the degree of brain edema atypical variants are considered. No hydrocephalus or herniation but there is diffuse effacement of both supratentorial sulci. No hemorrhage. Vascular: Negative Skull: Negative Sinuses/Orbits: Negative Currently attempting call report. IMPRESSION: Large mass centered on the cribriform plate which is  most likely a meningioma with peritumoral cyst. There is marked adjacent vasogenic  edema with signs of elevated intracranial pressure in the supratentorial space. Atypical variants are considered given the degree brain edema. Recommend MRI with contrast. Electronically Signed   By: Monte Fantasia M.D.   On: 10/29/2020 12:03   DG Chest Port 1 View  Result Date: 10/29/2020 CLINICAL DATA:  Hypoxia EXAM: PORTABLE CHEST 1 VIEW COMPARISON:  October 28, 2019 FINDINGS: Heart is mildly enlarged with mild pulmonary venous hypertension. There is airspace opacity in each lung base, more on the right than on the left. There is no appreciable adenopathy. There is a stent in the left axillary region. IMPRESSION: 1. Airspace opacity in the lung bases which is concerning for potential degree of bibasilar pneumonia. Pulmonary edema could present similarly is a differential consideration. Both pneumonia and edema may present concurrently. 2. Heart mildly prominent with a degree of pulmonary vascular congestion. Electronically Signed   By: Lowella Grip III M.D.   On: 10/29/2020 10:29     ASSESSMENT & PLAN:    Assessment & Plan by Problem: Active Problems:   Encephalopathy   Ruth Gutierrez is a 55 y.o. with pertinent PMH of ESRD on dialysis (MWF) s/p failed kidney transplant (2010), HTN  who presented with SOB, AMS and admitted for intercranial mass on hospital day 0.   1. Intercranial mass, HA: Increased somnolence, worsening frontal HA occurring every other day, denies ataxia, vision changes. CT significant for large mass on cribriform plate "most likely a meningioma with peritumoral cyst and significant vasogenic edema with signs of elevated intercranial pressure.  -Appreciate neurosurgery consult and recommendations  -MRI pending -Dexamethasone 32m, Q8H IV  2.  Shortness of breath: Worsening over the last couple of days, productive cough, subjective fevers, ~20 pack year smoking history, no reported  chronic lung disease, lactic acid 2.4.  Patient with periorbital edema, otherwise appears euvolemic on exam. CXR concerning for bibasilar pneumonia vs pulmonary edema vs both. Ddx includes CAP, bibasilar infiltrates make viral more likely than bacterial vs aspiration pneumonia made likely due it increased somnolence vs pulmonary edema due to potential missed dialysis.  Fluid also may be secondary to severe symptomatic hypertension. -Follow proBNP -Follow pro-calcitonin  -Follow viral respiratory panel  -Continue cefepime 1 g qd -Follow blood cultures   -Trend lactic acid  -Continuous pulse ox monitoring, O2 to keep O2 stat >92%  3. ESRD on dialysis (MWF) Cr 8.27 w/ BUN 42, was not able to complete hemodialysis today, patient states she has not missed other dialysis appointments. LUE AVF with increased bleeding today, currently controlled with clamp -Appreciate nephrology consult and recommendations -Hemodialysis planned for tomorrow   4.  Severe symptomatic hypertension BP 244/101 on admission, concerning for hypertensive emergency. Takes amlodipine 10 mg at home, states is inconsistent with medication adherence.  -We will hold amlodipine for now, consider restarting tomorrow -Permissive HTN due to intercranial mass  -Labetalol 5 mg PRN for BP over 180/80 -Consider increasing home meds prior to discharge   5. Anemia of chronic disease Hgb 9.5 -Follow CBC -Reticulocyte count, smear pending -Continue mircera 75 mcg q 2 weeks  -Continue iron 50 mg weekly   6. Secondary hyperparathyroidism, hyperphosphatemia  Phosphorus elevated at 6.7 -Follow phosphorus  -Sensipar 180 mg and hecerol 5 mcg w/dialysis   Diet: Renal diet VTE: None IVF: None,None Code: Full  Prior to Admission Living Arrangement: Home, living with sister  Anticipated Discharge Location: Home Barriers to Discharge: SOB work-up, cranial mass work-up   Dispo: Admit patient to Inpatient with expected length of  stay greater  than 2 midnights.  SignedRiesa Pope, MD Pager: [phone]  10/29/2020, 6:33 PM    I have seen and examined the patient myself, and I have reviewed the note by student Dr. Merrilyn Puma, [place] [address] and was present during the interview and physical exam.  Please see my separate H&P for additional findings, assessment, and plan.   Signed: Riesa Pope, MD 10/29/2020, 6:33 PM

## 2020-10-29 NOTE — Hospital Course (Addendum)
Follow up: May need colonoscopy for anemia in OP setting

## 2020-10-29 NOTE — Progress Notes (Signed)
Pharmacy Antibiotic Note  ZAELYN NOACK is a 55 y.o. female admitted on 10/29/2020 with pneumonia.  Pharmacy has been consulted for Cefepime dosing. ESRD.  Plan: Cefepime 1 gm IV q24hr Monitor clinical status and cultures and sensitivities.   Temp (24hrs), Avg:99.1 F (37.3 C), Min:99.1 F (37.3 C), Max:99.1 F (37.3 C)  Recent Labs  Lab 10/29/20 0944  WBC 10.4    CrCl cannot be calculated (Patient's most recent lab result is older than the maximum 21 days allowed.).    Allergies  Allergen Reactions  . Penicillin G Itching and Rash  . Penicillins Itching and Rash    Did it involve swelling of the face/tongue/throat, SOB, or low BP?Y Did it involve sudden or severe rash/hives, skin peeling, or any reaction on the inside of your mouth or nose? Y Did you need to seek medical attention at a hospital or doctor's office? Y When did it last happen?2016 If all above answers are "NO", may proceed with cephalosporin use.    Thank you for allowing pharmacy to be a part of this patient's care.  Alanda Slim, PharmD, [facility] Clinical Pharmacist Please see AMION for all Pharmacists' Contact Phone Numbers 10/29/2020, 11:12 AM

## 2020-10-29 NOTE — ED Triage Notes (Signed)
[facility], did not complete dialysis on Friday. Went today and tx stopped after 1.4 liters off d/t "pt being non responsive". Pt A&O X4, groggy. Denies CP, SOB or diarrhea.

## 2020-10-29 NOTE — ED Provider Notes (Signed)
[facility] EMERGENCY DEPARTMENT Provider Note   CSN: [id number] Arrival date & time:        History Chief Complaint  Patient presents with  . Weakness    Ruth Gutierrez is a 55 y.o. female.  55 yo F with a chief complaints of unresponsiveness while at dialysis.  The patient reportedly could not get dialysis on Friday.  She is unable to tell me why.  She is able to mumble yes or no.  Level 5 caveat altered mental status.   Weakness      Past Medical History:  Diagnosis Date  . Anemia of chronic disease   . Arthritis   . Deceased-donor kidney transplant    Performed at [facility], April 2010.  Initial ESRD due to HTN nephropathy  . Eczema   . ESRD (end stage renal disease) ([place])    s/p transplant creatinine baseline 1.1  M/W/F dialysis  . FUO (fever of unknown origin) 05/17/2015  . GERD (gastroesophageal reflux disease)   . Headache(784.0)   . History of hyperparathyroidism   . Hypertension   . Peritonitis ([place]) 10/2019  . Shortness of breath   . Wears glasses     Patient Active Problem List   Diagnosis Date Noted  . Coagulation defect, unspecified ([place]) 11/01/2019  . Pain, unspecified 11/01/2019  . Pruritus, unspecified 11/01/2019  . Shortness of breath 11/01/2019  . Hypokalemia 10/28/2019  . Infection due to acinetobacter baumannii   . Peritonitis ([place]) 10/04/2019  . PD catheter dysfunction ([place])   . Generalized (acute) peritonitis ([place]) 10/03/2019  . Hypercalcemia 09/13/2019  . Nonspecific reaction to tuberculin skin test without active tuberculosis 05/04/2018  . Anemia in chronic kidney disease 04/29/2018  . Liver disease, unspecified 04/29/2018  . Moderate protein-calorie malnutrition ([place]) 04/29/2018  . Other abnormal findings in urine 04/29/2018  . Other dietary vitamin B12 deficiency anemia 04/29/2018  . Other disorders of electrolyte and fluid balance, not elsewhere classified 04/29/2018  . Other disorders resulting from  impaired renal tubular function 04/29/2018  . Other hyperlipidemia 04/29/2018  . Other long term (current) drug therapy 04/29/2018  . Unspecified jaundice 04/29/2018  . Renal dialysis device, implant, or graft complication 24/82/5003  . ESRD on hemodialysis ([place])   . HCAP (healthcare-associated pneumonia)   . Sepsis ([place]) 11/12/2015  . Pneumonia 11/12/2015  . FUO (fever of unknown origin) 05/17/2015  . Failed kidney transplant 05/08/2015  . Graft rejection, immunologic 05/08/2015  . Type 2 diabetes mellitus with diabetic chronic kidney disease ([place]) 10/12/2014  . Hypertension associated with transplantation 09/22/2014  . Non-compliance with treatment 09/22/2014  . Overweight (BMI 25.0-29.9) 09/22/2014  . Secondary renal hyperparathyroidism ([place]) 09/22/2014  . Subconjunctival hemorrhage of left eye 09/22/2014  . Transplant follow-up 09/22/2014  . Conjunctivitis of left eye 09/20/2014  . Acute blood loss anemia 09/08/2014  . Abnormal uterine bleeding 09/07/2014  . History of medication noncompliance 04/18/2014  . Immunosuppression ([place]) 04/18/2014  . Acute renal failure ([place]) 04/05/2013  . Diarrhea 04/05/2013  . Acute pyelonephritis 02/02/2013  . Viral gastroenteritis 02/02/2013  . Sepsis due to Klebsiella pneumoniae ([place]) 06/02/2011  . Fatigue 06/02/2011  . Tobacco abuse 06/02/2011  . Iron deficiency anemia 04/04/2011  . Hypertension 04/04/2011  . S/P kidney transplant 04/04/2011  . Clotted renal dialysis AV graft ([place]) 04/04/2011  . Fibroids 04/04/2011  . Preventative health care 04/04/2011    Past Surgical History:  Procedure Laterality Date  . A/V FISTULAGRAM Left 02/12/2017   Procedure: A/V Fistulagram;  Surgeon: Algernon Huxley, MD;  Location: [facility];  Service: Cardiovascular;  Laterality: Left;  . A/V FISTULAGRAM Left 12/30/2017   Procedure: A/V FISTULAGRAM;  Surgeon: Algernon Huxley, MD;  Location: [facility];  Service: Cardiovascular;  Laterality:  Left;  . A/V SHUNT INTERVENTION N/A 02/12/2017   Procedure: A/V Shunt Intervention;  Surgeon: Algernon Huxley, MD;  Location: [facility];  Service: Cardiovascular;  Laterality: N/A;  . AV FISTULA PLACEMENT    . BASCILIC VEIN TRANSPOSITION Left 10/30/2014   Procedure: LEFT BASCILIC VEIN TRANSPOSITION;  Surgeon: Rosetta Posner, MD;  Location: [facility];  Service: Vascular;  Laterality: Left;  . BASCILIC VEIN TRANSPOSITION Left 01/03/2015   Procedure: LEFT ARM 2ND STAGE BASCILIC VEIN TRANSPOSITION;  Surgeon: Rosetta Posner, MD;  Location: [facility];  Service: Vascular;  Laterality: Left;  . CAPD REMOVAL N/A 10/31/2019   Procedure: PERITONEAL DIALYSIS  (CAPD) INFECTED CATHETER REMOVAL;  Surgeon: Coralie Keens, MD;  Location: [facility];  Service: General;  Laterality: N/A;  . FRACTURE SURGERY     left foot,baby toe nad next toe missing  . INSERTION OF DIALYSIS CATHETER Right 10/30/2014   Procedure: INSERTION OF DIALYSIS CATHETER;  Surgeon: Rosetta Posner, MD;  Location: [facility];  Service: Vascular;  Laterality: Right;  . KIDNEY TRANSPLANT  11/2008   [facility])  . WISDOM TOOTH EXTRACTION       OB History   No obstetric history on file.     Family History  Problem Relation Age of Onset  . Hypertension Mother   . Hypertension Father   . Diabetes Brother   . Deep vein thrombosis Brother   . Hypertension Sister   . Hyperlipidemia Sister   . Kidney disease Brother        on HD  . Hypertension Sister   . Colon cancer Neg Hx   . Esophageal cancer Neg Hx   . Rectal cancer Neg Hx     Social History   Tobacco Use  . Smoking status: Current Every Day Smoker    Packs/day: 0.50    Years: 27.00    Pack years: 13.50    Types: Cigarettes  . Smokeless tobacco: Never Used  . Tobacco comment: 10 cigarettes a day  Vaping Use  . Vaping Use: Never used  Substance Use Topics  . Alcohol use: No    Alcohol/week: 0.0 standard drinks    Comment: occasional drinker noted in the past  . Drug use: No     Home Medications Prior to Admission medications   Medication Sig Start Date End Date Taking? Authorizing Provider  amLODipine (NORVASC) 5 MG tablet Take by mouth. 12/07/19   [provider]  aspirin 81 MG EC tablet Take 81 mg by mouth as directed. 05/15/15   [provider]  calcitRIOL (ROCALTROL) 0.25 MCG capsule Take 0.25 mcg by mouth daily.    [provider]  cholecalciferol (VITAMIN D3) 25 MCG (1000 UNIT) tablet Take 1,000 Units by mouth daily.    [provider]  cinacalcet (SENSIPAR) 90 MG tablet Take 90 mg by mouth daily.  11/26/18   [provider]  lidocaine-prilocaine (EMLA) cream SMARTSIG:Sparingly Topical As Directed 02/01/20   [provider]  Methoxy PEG-Epoetin Beta (MIRCERA IJ) Mircera 11/11/19 11/09/20  [provider]  omeprazole (PRILOSEC OTC) 20 MG tablet Take 20 mg by mouth daily as needed for heartburn. 08/23/18   [provider]  polyethylene glycol (MIRALAX / Floria Raveling)  17 g packet Take 17 g by mouth daily. Patient taking differently: Take 17 g by mouth daily as needed for mild constipation. 10/11/19   Seawell, Jaimie A, DO  potassium chloride SA (KLOR-CON) 20 MEQ tablet Take 2 tablets (40 mEq total) by mouth daily. 10/11/19   Seawell, Jaimie A, DO  sucroferric oxyhydroxide (VELPHORO) 500 MG chewable tablet Chew 1,500 mg by mouth 3 (three) times daily with meals. Taking one tablet with a snack sometimes 08/27/18   [provider]  Tuberculin PPD (TUBERSOL ID) Inject into the skin. 11/02/19   [provider]    Allergies    Penicillin g and Penicillins  Review of Systems   Review of Systems  Unable to perform ROS: Mental status change  Neurological: Positive for weakness.    Physical Exam Updated Vital Signs BP (!) 168/91   Pulse (!) 102   Temp 99.1 F (37.3 C) (Oral)   Resp (!) 26   LMP 06/02/2015   SpO2 94%   Physical Exam Vitals and nursing note reviewed.  Constitutional:       General: She is not in acute distress.    Appearance: She is well-developed. She is not diaphoretic.  HENT:     Head: Normocephalic and atraumatic.  Eyes:     Pupils: Pupils are equal, round, and reactive to light.  Cardiovascular:     Rate and Rhythm: Normal rate and regular rhythm.     Heart sounds: No murmur heard. No friction rub. No gallop.   Pulmonary:     Effort: Pulmonary effort is normal.     Breath sounds: No wheezing or rales.     Comments: Course breath sounds in all fields Abdominal:     General: There is no distension.     Palpations: Abdomen is soft.     Tenderness: There is no abdominal tenderness.  Musculoskeletal:        General: No tenderness.     Cervical back: Normal range of motion and neck supple.     Comments: Left AV fistula with palpable thrill.  Dialysis clamps in place.  Skin:    General: Skin is warm and dry.  Neurological:     Comments: Follows commands.  Able to mumble yes or no to direct questioning.     ED Results / Procedures / Treatments   Labs (all labs ordered are listed, but only abnormal results are displayed) Labs Reviewed  COMPREHENSIVE METABOLIC PANEL - Abnormal; Notable for the following components:      Result Value   Chloride 95 (*)    Glucose, Bld 100 (*)    BUN 42 (*)    Creatinine, Ser 8.27 (*)    Albumin 3.3 (*)    Alkaline Phosphatase 256 (*)    GFR, Estimated 5 (*)    All other components within normal limits  LACTIC ACID, PLASMA - Abnormal; Notable for the following components:   Lactic Acid, Venous 2.4 (*)    All other components within normal limits  CBC WITH DIFFERENTIAL/PLATELET - Abnormal; Notable for the following components:   RBC 2.98 (*)    Hemoglobin 9.2 (*)    HCT 28.8 (*)    RDW 16.8 (*)    Platelets 137 (*)    Neutro Abs 8.9 (*)    Lymphs Abs 0.6 (*)    All other components within normal limits  I-STAT BETA HCG BLOOD, ED (MC, WL, AP ONLY) - Abnormal; Notable for the following components:    I-stat  hCG, quantitative 10.9 (*)    All other components within normal limits  I-STAT VENOUS BLOOD GAS, ED - Abnormal; Notable for the following components:   pH, Ven 7.450 (*)    pO2, Ven 68.0 (*)    Bicarbonate 31.9 (*)    TCO2 33 (*)    Acid-Base Excess 7.0 (*)    Calcium, Ion 1.05 (*)    HCT 28.0 (*)    Hemoglobin 9.5 (*)    All other components within normal limits  RESP PANEL BY RT-PCR (FLU A&B, COVID) ARPGX2  URINE CULTURE  CULTURE, BLOOD (ROUTINE X 2)  CULTURE, BLOOD (ROUTINE X 2)  ETHANOL  URINALYSIS, ROUTINE W REFLEX MICROSCOPIC  AMMONIA  PHOSPHORUS  CBG MONITORING, ED    EKG EKG Interpretation  Date/Time:  Monday October 29 2020 09:41:08 EDT Ventricular Rate:  87 PR Interval:    QRS Duration: 88 QT Interval:  396 QTC Calculation: 477 R Axis:   6 Text Interpretation: Sinus rhythm Probable left atrial enlargement Abnormal R-wave progression, early transition Borderline T wave abnormalities No significant change since last tracing Confirmed by Deno Etienne [phone]) on 10/29/2020 10:03:06 AM   Radiology CT HEAD WO CONTRAST  Result Date: 10/29/2020 CLINICAL DATA:  Delirium.  Worsening generalized weakness. EXAM: CT HEAD WITHOUT CONTRAST TECHNIQUE: Contiguous axial images were obtained from the base of the skull through the vertex without intravenous contrast. COMPARISON:  None. FINDINGS: Brain: Isodense mass with speckled calcifications centered on the cribriform plate and measuring up to 4 x 3.6 x 2.5 cm. The mass has a dural-based epicenter without visible hyperostosis or transcranial growth. There is marked vasogenic edematous appearance of the bilateral frontal lobes with an adjacent cystic component between the mass in the right frontal lobe measuring up to 2.8 cm. The mass is most likely meningioma; no chart history of malignancy. Given the degree of brain edema atypical variants are considered. No hydrocephalus or herniation but there is diffuse effacement of both  supratentorial sulci. No hemorrhage. Vascular: Negative Skull: Negative Sinuses/Orbits: Negative Currently attempting call report. IMPRESSION: Large mass centered on the cribriform plate which is most likely a meningioma with peritumoral cyst. There is marked adjacent vasogenic edema with signs of elevated intracranial pressure in the supratentorial space. Atypical variants are considered given the degree brain edema. Recommend MRI with contrast. Electronically Signed   By: Monte Fantasia M.D.   On: 10/29/2020 12:03   DG Chest Port 1 View  Result Date: 10/29/2020 CLINICAL DATA:  Hypoxia EXAM: PORTABLE CHEST 1 VIEW COMPARISON:  October 28, 2019 FINDINGS: Heart is mildly enlarged with mild pulmonary venous hypertension. There is airspace opacity in each lung base, more on the right than on the left. There is no appreciable adenopathy. There is a stent in the left axillary region. IMPRESSION: 1. Airspace opacity in the lung bases which is concerning for potential degree of bibasilar pneumonia. Pulmonary edema could present similarly is a differential consideration. Both pneumonia and edema may present concurrently. 2. Heart mildly prominent with a degree of pulmonary vascular congestion. Electronically Signed   By: Lowella Grip III M.D.   On: 10/29/2020 10:29    Procedures Procedures   Medications Ordered in ED Medications  ceFEPIme (MAXIPIME) 1 g in sodium chloride 0.9 % 100 mL IVPB (1 g Intravenous New Bag/Given 10/29/20 1247)  Chlorhexidine Gluconate Cloth 2 % PADS 6 each (has no administration in time range)  cinacalcet (SENSIPAR) tablet 180 mg (has no administration in time range)  doxercalciferol (HECTOROL) capsule 5  mcg (has no administration in time range)  dexamethasone (DECADRON) injection 4 mg (has no administration in time range)  vancomycin (VANCOREADY) IVPB 1500 mg/300 mL (0 mg Intravenous Stopped 10/29/20 1208)  ondansetron (ZOFRAN) injection 4 mg (4 mg Intravenous Given 10/29/20 1214)   diphenhydrAMINE (BENADRYL) injection 12.5 mg (12.5 mg Intravenous Given 10/29/20 1214)    ED Course  I have reviewed the triage vital signs and the nursing notes.  Pertinent labs & imaging results that were available during my care of the patient were reviewed by me and considered in my medical decision making (see chart for details).    MDM Rules/Calculators/A&P                          55 yo F with a chief complaints of altered mental status.  Reportedly was unresponsive while at dialysis.  Patient appears to be quite sleepy on exam.  Will obtain an altered mental status work-up.  Of note the patient is also hypoxic, not on oxygen at baseline.  Coarse breath sounds in all fields.  Chest x-ray with possible pneumonia versus edema.  Patient does endorse some coughing and fevers at home.  Will start on antibiotics.  I did discuss the case with Dr. Osborne Casco, nephrology will try and arrange for dialysis today.  CT scan of the head with a mass with some vasogenic edema concerning for a meningioma most likely per radiology.  I did discuss the results with the radiologist.  I discussed the case with Dr. Ellene Route, neurosurgery recommended 4 mg of Decadron every 8.  Will come and evaluate the patient at bedside.  Did recommend an MRI of the brain with contrast.  Will discuss with medicine for admission.  CRITICAL CARE Performed by: Cecilio Asper   Total critical care time: 80 minutes  Critical care time was exclusive of separately billable procedures and treating other patients.  Critical care was necessary to treat or prevent imminent or life-threatening deterioration.  Critical care was time spent personally by me on the following activities: development of treatment plan with patient and/or surrogate as well as nursing, discussions with consultants, evaluation of patient's response to treatment, examination of patient, obtaining history from patient or surrogate, ordering and  performing treatments and interventions, ordering and review of laboratory studies, ordering and review of radiographic studies, pulse oximetry and re-evaluation of patient's condition.  The patients results and plan were reviewed and discussed.   Any x-rays performed were independently reviewed by myself.   Differential diagnosis were considered with the presenting HPI.  Medications  ceFEPIme (MAXIPIME) 1 g in sodium chloride 0.9 % 100 mL IVPB (1 g Intravenous New Bag/Given 10/29/20 1247)  Chlorhexidine Gluconate Cloth 2 % PADS 6 each (has no administration in time range)  cinacalcet (SENSIPAR) tablet 180 mg (has no administration in time range)  doxercalciferol (HECTOROL) capsule 5 mcg (has no administration in time range)  dexamethasone (DECADRON) injection 4 mg (has no administration in time range)  vancomycin (VANCOREADY) IVPB 1500 mg/300 mL (0 mg Intravenous Stopped 10/29/20 1208)  ondansetron (ZOFRAN) injection 4 mg (4 mg Intravenous Given 10/29/20 1214)  diphenhydrAMINE (BENADRYL) injection 12.5 mg (12.5 mg Intravenous Given 10/29/20 1214)    Vitals:   10/29/20 1115 10/29/20 1145 10/29/20 1200 10/29/20 1230  BP: (!) 198/125 (!) 187/135 (!) 200/96 (!) 168/91  Pulse:    (!) 102  Resp: (!) 21 (!) 29 (!) 27 (!) 26  Temp:  TempSrc:      SpO2: 97%  97% 94%    Final diagnoses:  Brain mass  Hypoxia  Somnolence  HCAP (healthcare-associated pneumonia)    Admission/ observation were discussed with the admitting physician, patient and/or family and they are comfortable with the plan.    Final Clinical Impression(s) / ED Diagnoses Final diagnoses:  Brain mass  Hypoxia  Somnolence  HCAP (healthcare-associated pneumonia)    Rx / DC Orders ED Discharge Orders    None       Deno Etienne, DO 10/29/20 1258

## 2020-10-29 NOTE — ED Notes (Signed)
Pat to MRI at this time

## 2020-10-29 NOTE — Consult Note (Signed)
ESRD Consult Note  Requesting provider: Deno Etienne, MD Service requesting consult: ER Reason for consult: ESRD, provision of dialysis Indication for acute dialysis?: End Stage Renal Disease  Outpatient dialysis unit: [facility] Outpatient dialysis schedule: MWF Records: MonWedFri, 4 hrs 0 min, 180NRe Optiflux, BFR 400, DFR Manual 500 mL/min, EDW 73.5 (kg), Dialysate 2.0 K, 2.0 Ca, Sodium 137 (mEq/L), Bicarb Setting: 35 (mEq/L), Access: AVFistula Sensipar 180 MWF, hecterol 5 MWF, iron 50 weekly, mircera 75 q2 wks (due April 4)  Assessment/Recommendations: Ruth Gutierrez is a/an 55 y.o. female with a past medical history notable for ESRD on HD admitted with AMS due to intracranial mass.   # ESRD: Will plan for HD tomorrow and will need to be gentle w/ 2.5 hr session and low DFR/BFR given intracranial mass and edema. Dysequilibrium with cerebral edema likely the reason for her worsening AMS on dialysis. # Volume/ hypertension: EDW 73.5kg. Careful UF due to reasons as above. Likely challenge EDW as able. Would lower BP very carefully given intracranial mass. # Anemia of Chronic Kidney Disease: Hemoglobin 9.5. Currently receiving mircera 64mcg q2 weeks and iron 50mg  weekly.  # Secondary Hyperparathyroidism/Hyperphosphatemia: Continue home sensipar 180mg  and hecterol 28mcg w/ dialysis. On velphoro 1500 w/ meals. Will f/u phos # Vascular access: LUE AVF w/ thrill and pulsatility. Prolonged bleeding today. Continue w/ pressure and do not stick today. Control HTN and consider VVS consult if bleeding continues # AMS/Intracranial mass, likely meningioma w/ severe vasogenic edema: likely cause of AMS and explains why worse on HD due to osmotic change/fluid shifts. F/u MRI and consult specialist. Likely needs steroids # PNA?: concern on CXR. Possible aspiration given AMS but feel this more likely represents pulmonary edema. Defer antibiotics to primary team  # Additional recommendations: - Dose all meds for  creatinine clearance < 10 ml/min  - Unless absolutely necessary, no MRIs with gadolinium.  - Implement save arm precautions.  Prefer needle sticks in the dorsum of the hands or wrists.  No blood pressure measurements in arm. - If blood transfusion is requested during hemodialysis sessions, please alert [place] prior to the session.   Recommendations were discussed with the primary team.   History of Present Illness: Ruth Gutierrez is a/an 55 y.o. female with a past medical history of ESRD who presents with altered mental status while at dialysis.  The patient is very confused on my assessment. Much of the history was obtained per chart review.  Patient was reportedly unresponsive at dialysis and transferred to the ER for further eval. Possibly was too somnolent last Friday on dialysis as well and did not receive full treatment. Patient states she was having a headache on dialysis today and that is why she stopped early and was sent here. She says the headache has come and gone but otherwise can provide further details. She says "I don't know why I am confused." She reportedly had 1-2L removed on dialysis. HD notes state she has been non-compliant with dialysis lately, had large idwg, and Bps have been higher.  In the ER she was noted to be very hypertensive w/ SBP of 240 initially.  CMP was overall reassuring with normal electrolytes. Lactic acid was 2.4 and hgb 9.5. When I arrived she had saturated her AVF dressing after automated pressure was applied for 2 hours. When a change was attempted patient had blood spurt from fistula. Dressing was applied x2 before apparent hemostasis was achieved. Patient states this has been an issue on and off.  CXR was concerning for pneumonia vs pulmonary edema. Broad spectrum abx were given. CT head unfortunately demonstrated large mass centered on the cribiform plate c/w for meningioma w/ peritumoral cyst and marked vasogenic edema.  Medications:  Current  Facility-Administered Medications  Medication Dose Route Frequency Provider Last Rate Last Admin  . ceFEPIme (MAXIPIME) 1 g in sodium chloride 0.9 % 100 mL IVPB  1 g Intravenous Q24H Deno Etienne, DO      . [START ON 10/30/2020] Chlorhexidine Gluconate Cloth 2 % PADS 6 each  6 each Topical Q0600 Reesa Chew, MD      . Derrill Memo ON 10/31/2020] cinacalcet (SENSIPAR) tablet 180 mg  180 mg Oral Q M,W,F Reesa Chew, MD      . diphenhydrAMINE (BENADRYL) injection 12.5 mg  12.5 mg Intravenous Once Tyrone Nine, Dan, DO      . [START ON 10/31/2020] doxercalciferol (HECTOROL) capsule 5 mcg  5 mcg Oral Once per day on Mon Wed Fri Peeples, Samuel J, MD      . ondansetron [facility]) injection 4 mg  4 mg Intravenous Once Deno Etienne, DO       Current Outpatient Medications  Medication Sig Dispense Refill  . amLODipine (NORVASC) 5 MG tablet Take by mouth.    Marland Kitchen aspirin 81 MG EC tablet Take 81 mg by mouth as directed.    . calcitRIOL (ROCALTROL) 0.25 MCG capsule Take 0.25 mcg by mouth daily.    . cholecalciferol (VITAMIN D3) 25 MCG (1000 UNIT) tablet Take 1,000 Units by mouth daily.    . cinacalcet (SENSIPAR) 90 MG tablet Take 90 mg by mouth daily.     Marland Kitchen lidocaine-prilocaine (EMLA) cream SMARTSIG:Sparingly Topical As Directed    . Methoxy PEG-Epoetin Beta (MIRCERA IJ) Mircera    . omeprazole (PRILOSEC OTC) 20 MG tablet Take 20 mg by mouth daily as needed for heartburn.    . polyethylene glycol (MIRALAX / GLYCOLAX) 17 g packet Take 17 g by mouth daily. (Patient taking differently: Take 17 g by mouth daily as needed for mild constipation.) 30 each 0  . potassium chloride SA (KLOR-CON) 20 MEQ tablet Take 2 tablets (40 mEq total) by mouth daily. 60 tablet 0  . sucroferric oxyhydroxide (VELPHORO) 500 MG chewable tablet Chew 1,500 mg by mouth 3 (three) times daily with meals. Taking one tablet with a snack sometimes    . Tuberculin PPD (TUBERSOL ID) Inject into the skin.       ALLERGIES Penicillin g and  Penicillins  MEDICAL HISTORY Past Medical History:  Diagnosis Date  . Anemia of chronic disease   . Arthritis   . Deceased-donor kidney transplant    Performed at [facility], April 2010.  Initial ESRD due to HTN nephropathy  . Eczema   . ESRD (end stage renal disease) ([place])    s/p transplant creatinine baseline 1.1  M/W/F dialysis  . FUO (fever of unknown origin) 05/17/2015  . GERD (gastroesophageal reflux disease)   . Headache(784.0)   . History of hyperparathyroidism   . Hypertension   . Peritonitis ([place]) 10/2019  . Shortness of breath   . Wears glasses      SOCIAL HISTORY Social History   Socioeconomic History  . Marital status: Single    Spouse name: Not on file  . Number of children: 1  . Years of education: Not on file  . Highest education level: Not on file  Occupational History  . Occupation: unemployed  Tobacco Use  . Smoking status: Current Every Day  Smoker    Packs/day: 0.50    Years: 27.00    Pack years: 13.50    Types: Cigarettes  . Smokeless tobacco: Never Used  . Tobacco comment: 10 cigarettes a day  Vaping Use  . Vaping Use: Never used  Substance and Sexual Activity  . Alcohol use: No    Alcohol/week: 0.0 standard drinks    Comment: occasional drinker noted in the past  . Drug use: No  . Sexual activity: Yes    Partners: Male    Birth control/protection: None  Other Topics Concern  . Not on file  Social History Narrative   Single, 1 daughter   Lives with daughter (born 79) and grandkids   sister helps her with medications etc.   cigarette smoker, rare EtOH, no drugs   Social Determinants of Radio broadcast assistant Strain: Not on file  Food Insecurity: Not on file  Transportation Needs: Not on file  Physical Activity: Not on file  Stress: Not on file  Social Connections: Not on file  Intimate Partner Violence: Not on file     FAMILY HISTORY Family History  Problem Relation Age of Onset  . Hypertension Mother   .  Hypertension Father   . Diabetes Brother   . Deep vein thrombosis Brother   . Hypertension Sister   . Hyperlipidemia Sister   . Kidney disease Brother        on HD  . Hypertension Sister   . Colon cancer Neg Hx   . Esophageal cancer Neg Hx   . Rectal cancer Neg Hx      Review of Systems: 12 systems were reviewed and negative except per HPI  Physical Exam: Vitals:   10/29/20 1145 10/29/20 1200  BP: (!) 187/135 (!) 200/96  Pulse:    Resp: (!) 29 (!) 27  Temp:    SpO2:  97%   No intake/output data recorded. No intake or output data in the 24 hours ending 10/29/20 1213 General: confused, lying in bed, no distress HEENT: anicteric sclera, MMM CV: normal rate, no murmurs, no edema Lungs: bilateral chest rise, normal wob but tachypnea present Abd: soft, non-tender, non-distended Skin: no visible lesions or rashes Psych: awake, and alert, mood appropriate Neuro: speech not slurred but slow to answer, overall confused and often falling asleep. Moves arms and legs spontaneously.  Test Results Reviewed Lab Results  Component Value Date   NA 136 10/29/2020   K 4.0 10/29/2020   CL 95 (L) 10/29/2020   CO2 27 10/29/2020   BUN 42 (H) 10/29/2020   CREATININE 8.27 (H) 10/29/2020   CALCIUM 9.4 10/29/2020   ALBUMIN 3.3 (L) 10/29/2020   PHOS 3.4 11/01/2019    I have reviewed relevant outside healthcare records

## 2020-10-29 NOTE — ED Notes (Signed)
X-ray at bedside

## 2020-10-29 NOTE — ED Notes (Signed)
Pts HR 135, vomiting. Vancomycin stopped. No swelling to mouth or face at this time.

## 2020-10-29 NOTE — H&P (Shared)
Date: 10/29/2020               Patient Name:  Ruth Gutierrez MRN: [id number]  DOB: November 07, 1965 Age / Sex: 55 y.o., female   PCP: Pcp, No         Medical Service: Internal Medicine Teaching Service         Attending Physician: Dr. Deno Etienne, DO    First Contact: Sanjuana Letters, DO Pager: Maryjean Morn [phone]  Second Contact: Marianna Payment, DO Pager: [facility] [phone]       After Hours (After 5p/  First Contact Pager: [phone]  weekends / holidays): Second Contact Pager: [phone]   SUBJECTIVE   Chief Complaint: ***  History of Present Illness:   Shortness of breath past few days as well as cough. Felt weak past 3-4 days. Has not felt well over past few weeks. Has not missed dialysis recently. MWF patient, endorses adherence to dialysis schedule. Uncertain if she has had shortness of breath in the past with dialysis. Worsening memory recently.   Headaches, every other day. Frontal headaches. Sometimes takes medications for his headaches. Ibuprofen 800 mg helps. Denies n/v with headaches. Denies any changes with your vision. Denies dizziness. Hx of headaches, mostly occur when she has had dialysis.   Puffy eyelids, does it every so often. Denies chest pain.Hx of gerd. Denies difficulty passing her bowels. Denies dark or tarry stools  Does not make her own urine! (Great question to ask!) Kidney transplant 8 years that failed.   ED Course:   Lab Orders     Urine culture     Resp Panel by RT-PCR (Flu A&B, Covid) Nasopharyngeal Swab     Blood culture (routine x 2)     Comprehensive metabolic panel     Lactic acid, plasma     Ethanol     CBC WITH DIFFERENTIAL     Urinalysis, Routine w reflex microscopic     Ammonia     Phosphorus     CBG monitoring, ED     I-Stat beta hCG blood, ED     I-Stat venous blood gas, ED   Meds:  No outpatient medications have been marked as taking for the 10/29/20 encounter [facility] Encounter).   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  Cigarettes - 1/2 PPD   Illicit Substance Use  Family History: Mother - migraine headaches,asthma Father - uncertain 2 Sisters 1 Brother - deceased - ESRD,  Allergies: Allergies as of 10/29/2020 - Review Complete 09/16/2020  Allergen Reaction Noted  . Penicillin g Itching and Rash 11/12/2015  . Penicillins Itching and Rash 02/02/2013   Past Medical History:  Diagnosis Date  . Anemia of chronic disease   . Arthritis   . Deceased-donor kidney transplant    Performed at [facility], April 2010.  Initial ESRD due to HTN nephropathy  . Eczema   . ESRD (end stage renal disease) ([place])    s/p transplant creatinine baseline 1.1  M/W/F dialysis  . FUO (fever of unknown origin) 05/17/2015  . GERD (gastroesophageal reflux disease)   . Headache(784.0)   . History of hyperparathyroidism   . Hypertension   . Peritonitis ([place]) 10/2019  . Shortness of breath   . Wears glasses    PMHx  BP - (2x a week misses medicine) ESRD -  Dialysis MWF  PSHx Kidney transplant 2012? Toe amputation  LNMP -  G1P1A1  Review of Systems: A complete ROS was negative except as per HPI.   OBJECTIVE:   Physical Exam: Blood pressure (!) 167/93, pulse 100, temperature 99.1 F (37.3 C), temperature source Oral, resp. rate (!) 21, last menstrual period 06/02/2015, SpO2 97 %.  Constitutional: well-appearing *** sitting in ***, in no acute distress HENT: normocephalic atraumatic, mucous membranes moist Eyes: conjunctiva non-erythematous Neck: supple Cardiovascular: regular rate and rhythm, no m/r/g Pulmonary/Chest: normal work of breathing on room air, lungs clear to auscultation bilaterally Abdominal: soft, non-tender, non-distended MSK: normal bulk and tone Neurological: alert & oriented x 3, 5/5 strength in bilateral  upper and lower extremities, normal gait Skin: warm and dry Psych: ***  Labs: CBC    Component Value Date/Time   WBC 10.4 10/29/2020 0944   RBC 2.98 (L) 10/29/2020 0944   HGB 9.5 (L) 10/29/2020 1031   HGB 8.9 (L) 11/08/2019 0940   HCT 28.0 (L) 10/29/2020 1031   HCT 26.4 (L) 11/08/2019 0940   PLT 137 (L) 10/29/2020 0944   PLT 223 11/08/2019 0940   MCV 96.6 10/29/2020 0944   MCV 96 11/08/2019 0940   MCH 30.9 10/29/2020 0944   MCHC 31.9 10/29/2020 0944   RDW 16.8 (H) 10/29/2020 0944   RDW 13.2 11/08/2019 0940   LYMPHSABS 0.6 (L) 10/29/2020 0944   LYMPHSABS 0.8 11/08/2019 0940   MONOABS 0.7 10/29/2020 0944   EOSABS 0.1 10/29/2020 0944   EOSABS 0.1 11/08/2019 0940   BASOSABS 0.0 10/29/2020 0944   BASOSABS 0.0 11/08/2019 0940     CMP     Component Value Date/Time   NA 136 10/29/2020 1031   NA 142 11/08/2019 0940   K 4.0 10/29/2020 1031   CL 95 (L) 10/29/2020 0944   CO2 27 10/29/2020 0944   GLUCOSE 100 (H) 10/29/2020 0944   BUN 42 (H) 10/29/2020 0944   BUN 16 11/08/2019 0940   CREATININE 8.27 (H) 10/29/2020 0944   CREATININE 1.47 (H) 04/14/2013 1149   CALCIUM 9.4 10/29/2020 0944   CALCIUM 7.2 (L) 01/13/2007 0830   PROT 6.7 10/29/2020 0944   ALBUMIN 3.3 (L) 10/29/2020 0944   AST 30 10/29/2020 0944   ALT 20 10/29/2020 0944   ALKPHOS 256 (H) 10/29/2020 0944   BILITOT 1.2 10/29/2020 0944   GFRNONAA 5 (L) 10/29/2020 0944   GFRNONAA 43 (L) 04/14/2013 1149   GFRAA 9 (L) 11/08/2019 0940   GFRAA 49 (L) 04/14/2013 1149    Imaging: CT HEAD WO CONTRAST  Result Date: 10/29/2020 CLINICAL DATA:  Delirium.  Worsening generalized weakness. EXAM: CT HEAD WITHOUT CONTRAST TECHNIQUE: Contiguous axial images were obtained from the base of the skull through the vertex without intravenous contrast. COMPARISON:  None. FINDINGS: Brain: Isodense mass with speckled calcifications centered on the cribriform plate and measuring up to 4 x 3.6 x 2.5 cm. The mass has a dural-based epicenter  without visible hyperostosis or transcranial growth. There is marked vasogenic edematous appearance of the bilateral frontal lobes with an adjacent cystic component between the mass in the right frontal lobe measuring up to 2.8 cm. The mass is most likely meningioma; no chart history of malignancy. Given the degree of brain edema atypical variants are considered. No hydrocephalus or herniation but there is diffuse effacement of both supratentorial sulci. No hemorrhage. Vascular: Negative Skull: Negative Sinuses/Orbits: Negative Currently attempting call report. IMPRESSION: Large mass centered on the cribriform plate which is most likely  a meningioma with peritumoral cyst. There is marked adjacent vasogenic edema with signs of elevated intracranial pressure in the supratentorial space. Atypical variants are considered given the degree brain edema. Recommend MRI with contrast. Electronically Signed   By: Monte Fantasia M.D.   On: 10/29/2020 12:03   DG Chest Port 1 View  Result Date: 10/29/2020 CLINICAL DATA:  Hypoxia EXAM: PORTABLE CHEST 1 VIEW COMPARISON:  October 28, 2019 FINDINGS: Heart is mildly enlarged with mild pulmonary venous hypertension. There is airspace opacity in each lung base, more on the right than on the left. There is no appreciable adenopathy. There is a stent in the left axillary region. IMPRESSION: 1. Airspace opacity in the lung bases which is concerning for potential degree of bibasilar pneumonia. Pulmonary edema could present similarly is a differential consideration. Both pneumonia and edema may present concurrently. 2. Heart mildly prominent with a degree of pulmonary vascular congestion. Electronically Signed   By: Lowella Grip III M.D.   On: 10/29/2020 10:29    EKG: personally reviewed my interpretation is***   ASSESSMENT & PLAN:    Assessment & Plan by Problem: Active Problems:   * No active hospital problems. *   ARIAM MOL is a 55 y.o. with pertinent PMH  of *** who presented with *** and admit for *** on hospital day 0  #*** ***  #*** ***  #*** ***  Diet: {NAMES:3044014::"Normal","Heart Healthy","Carb-Modified","Renal","Carb/Renal","NPO","TPN","Tube Feeds"} VTE: {NAMES:3044014::"Heparin","Enoxaparin","SCDs","NOAC","None"} IVF: {NAMES:3044014::"None","NS","1/2 NS","LR","D5","D10"},{NAMES:3044014::"None","10cc/hr","25cc/hr","50cc/hr","75cc/hr","100cc/hr","110cc/hr","125cc/hr","Bolus"} Code: {NAMES:3044014::"Full","DNR","DNI","DNR/DNI","Comfort Care","Unknown"}  Prior to Admission Living Arrangement: {NAMES:3044014::"Home, living ***","SNF, ***","Homeless","***"} Anticipated Discharge Location: {NAMES:3044014::"Home","SNF","CIR","***"} Barriers to Discharge: ***  Dispo: Admit patient to {STATUS:3044014::"Observation with expected length of stay less than 2 midnights.","Inpatient with expected length of stay greater than 2 midnights."}  Signed: Riesa Pope, MD Internal Medicine Resident PGY-1 Pager: [phone]  10/29/2020, 1:46 PM

## 2020-10-30 ENCOUNTER — Other Ambulatory Visit: Payer: Self-pay

## 2020-10-30 ENCOUNTER — Inpatient Hospital Stay (HOSPITAL_COMMUNITY): Payer: Medicare Other

## 2020-10-30 DIAGNOSIS — I10 Essential (primary) hypertension: Secondary | ICD-10-CM

## 2020-10-30 DIAGNOSIS — I160 Hypertensive urgency: Secondary | ICD-10-CM

## 2020-10-30 DIAGNOSIS — D320 Benign neoplasm of cerebral meninges: Principal | ICD-10-CM

## 2020-10-30 DIAGNOSIS — R0602 Shortness of breath: Secondary | ICD-10-CM | POA: Diagnosis not present

## 2020-10-30 DIAGNOSIS — I5030 Unspecified diastolic (congestive) heart failure: Secondary | ICD-10-CM | POA: Diagnosis not present

## 2020-10-30 LAB — RENAL FUNCTION PANEL
Albumin: 2.9 g/dL — ABNORMAL LOW (ref 3.5–5.0)
Anion gap: 15 (ref 5–15)
BUN: 62 mg/dL — ABNORMAL HIGH (ref 6–20)
CO2: 29 mmol/L (ref 22–32)
Calcium: 8.7 mg/dL — ABNORMAL LOW (ref 8.9–10.3)
Chloride: 94 mmol/L — ABNORMAL LOW (ref 98–111)
Creatinine, Ser: 10.63 mg/dL — ABNORMAL HIGH (ref 0.44–1.00)
GFR, Estimated: 4 mL/min — ABNORMAL LOW (ref >60)
Glucose, Bld: 184 mg/dL — ABNORMAL HIGH (ref 70–99)
Phosphorus: 7.6 mg/dL — ABNORMAL HIGH (ref 2.5–4.6)
Potassium: 4.9 mmol/L (ref 3.5–5.1)
Sodium: 138 mmol/L (ref 135–145)

## 2020-10-30 LAB — ECHOCARDIOGRAM COMPLETE
AR max vel: 3.53 cm2
AV Area VTI: 3.01 cm2
AV Area mean vel: 3.29 cm2
AV Mean grad: 6 mmHg
AV Peak grad: 9.7 mmHg
Ao pk vel: 1.56 m/s
Area-P 1/2: 5.02 cm2
Calc EF: 60.8 %
MV M vel: 5.38 m/s
MV Peak grad: 115.8 mmHg
MV VTI: 2.67 cm2
Radius: 0.2 cm
S' Lateral: 2.9 cm
Single Plane A2C EF: 69.1 %
Single Plane A4C EF: 48.8 %

## 2020-10-30 LAB — CBC
HCT: 23.3 % — ABNORMAL LOW (ref 36.0–46.0)
HCT: 23 % — ABNORMAL LOW (ref 36.0–46.0)
Hemoglobin: 7.8 g/dL — ABNORMAL LOW (ref 12.0–15.0)
Hemoglobin: 7.7 g/dL — ABNORMAL LOW (ref 12.0–15.0)
MCH: 32 pg (ref 26.0–34.0)
MCH: 31.6 pg (ref 26.0–34.0)
MCHC: 33.5 g/dL (ref 30.0–36.0)
MCHC: 33.5 g/dL (ref 30.0–36.0)
MCV: 95.5 fL (ref 80.0–100.0)
MCV: 94.3 fL (ref 80.0–100.0)
Platelets: 100 10*3/uL — ABNORMAL LOW (ref 150–400)
Platelets: 103 10*3/uL — ABNORMAL LOW (ref 150–400)
RBC: 2.44 MIL/uL — ABNORMAL LOW (ref 3.87–5.11)
RBC: 2.44 MIL/uL — ABNORMAL LOW (ref 3.87–5.11)
RDW: 16.3 % — ABNORMAL HIGH (ref 11.5–15.5)
RDW: 16.2 % — ABNORMAL HIGH (ref 11.5–15.5)
WBC: 6.3 10*3/uL (ref 4.0–10.5)
WBC: 8.5 10*3/uL (ref 4.0–10.5)
nRBC: 0 % (ref 0.0–0.2)
nRBC: 0 % (ref 0.0–0.2)

## 2020-10-30 MED ORDER — PENTAFLUOROPROP-TETRAFLUOROETH EX AERO: 1.0000 "application " | INHALATION_SPRAY | CUTANEOUS | Status: DC | PRN

## 2020-10-30 MED ORDER — ALTEPLASE 2 MG IJ SOLR: 2.0000 mg | Freq: Once | INTRAMUSCULAR | Status: DC | PRN

## 2020-10-30 MED ORDER — LIDOCAINE HCL (PF) 1 % IJ SOLN: 5.0000 mL | INTRAMUSCULAR | Status: DC | PRN

## 2020-10-30 MED ORDER — LIDOCAINE-PRILOCAINE 2.5-2.5 % EX CREA
1.0000 | TOPICAL_CREAM | CUTANEOUS | Status: DC | PRN
Start: 2020-10-30 — End: 2020-10-31

## 2020-10-30 MED ORDER — SODIUM CHLORIDE 0.9 % IV SOLN: 100.0000 mL | INTRAVENOUS | Status: DC | PRN

## 2020-10-30 MED ORDER — AZITHROMYCIN 500 MG PO TABS
500.0000 mg | ORAL_TABLET | Freq: Every day | ORAL | Status: DC
  Administered 2020-10-30 – 2020-10-31 (×2): 500 mg via ORAL
  Filled 2020-10-30 (×2): qty 1

## 2020-10-30 MED ORDER — HEPARIN SODIUM (PORCINE) 1000 UNIT/ML DIALYSIS: 1000.0000 [IU] | INTRAMUSCULAR | Status: DC | PRN

## 2020-10-30 MED ORDER — SUCROFERRIC OXYHYDROXIDE 500 MG PO CHEW
1000.0000 mg | CHEWABLE_TABLET | Freq: Three times a day (TID) | ORAL | Status: DC
  Administered 2020-10-30 – 2020-11-10 (×21): 1000 mg via ORAL
  Filled 2020-10-30 (×35): qty 2

## 2020-10-30 MED ORDER — SODIUM CHLORIDE 0.9 % IV SOLN
2.0000 g | Freq: Every day | INTRAVENOUS | Status: DC
  Administered 2020-10-30 – 2020-10-31 (×2): 2 g via INTRAVENOUS
  Filled 2020-10-30 (×2): qty 2

## 2020-10-30 NOTE — ED Notes (Signed)
Attempted to given report at this time

## 2020-10-30 NOTE — Progress Notes (Signed)
Nephrology Follow-Up Consult note   Assessment/Recommendations: Ruth Gutierrez is a/an 55 y.o. female with a past medical history significant for ESRD, admitted for AMS due to intracranial mass.     [facility] Records MonWedFri, 4 hrs 0 min, 180NRe Optiflux, BFR 400, DFR Manual 500 mL/min, EDW 73.5 (kg), Dialysate 2.0 K, 2.0 Ca, Sodium 137 (mEq/L), Bicarb Setting: 35 (mEq/L), Access: AVFistula Sensipar 180 MWF, hecterol 5 MWF, iron 50 weekly, mircera 75 q2 wks (due April 4)  # ESRD: Very gentle HD session today and monitor closely for neurological change. Plan for HD again tomorrow to optimize for surgery and get back on schedule. Dysequilibrium with cerebral edema likely the reason for her worsening AMS on dialysis. # Volume/ hypertension: EDW 73.5kg. Careful UF due to reasons as above. Likely challenge EDW as able. BP okay at this time # Anemia of Chronic Kidney Disease: Hemoglobin decreased to 7.8. Dilutional? CTM. Currently receiving mircera 52mcg q2 weeks and iron 50mg  weekly.  # Secondary Hyperparathyroidism/Hyperphosphatemia: Continue home sensipar 180mg  and hecterol 16mcg w/ dialysis. On velphoro 1500 w/ meals. # Vascular access: LUE AVF w/ thrill and pulsatility. Prolonged bleeding yesterday no resolved.  # AMS/Intracranial mass, meningioma w/ severe vasogenic edema: likely cause of AMS and explains why worse on HD due to osmotic change/fluid shifts. Neurosurgery with plan for surgery maybe Thursday. AMS much improved on steroids # PNA?: concern on CXR. Possible aspiration given AMS but feel this more likely represents pulmonary edema. Defer antibiotics to primary team  # Additional recommendations: - Dose all meds for creatinine clearance <10 ml/min  - Unless absolutely necessary, no MRIs with gadolinium.  - Implement save arm precautions. Prefer needle sticks in the dorsum of the hands or wrists. No blood pressure measurements in arm. - If blood transfusion is requested  during hemodialysis sessions, please alert [place] prior to the session.     Recommendations conveyed to primary service.    [facility] 10/30/2020 10:19 AM  ___________________________________________________________  CC: ESRD, AMS  Interval History/Subjective: Patient slightly confused today but overall much more awake and alert and able to communicate.  States that she feels fine and wants to go home.   Medications:  Current Facility-Administered Medications  Medication Dose Route Frequency Provider Last Rate Last Admin  . 0.9 %  sodium chloride infusion  100 mL Intravenous PRN Reesa Chew, MD      . 0.9 %  sodium chloride infusion  100 mL Intravenous PRN Reesa Chew, MD      . acetaminophen (TYLENOL) tablet 650 mg  650 mg Oral Q6H PRN Katsadouros, Vasilios, MD       Or  . acetaminophen (TYLENOL) suppository 650 mg  650 mg Rectal Q6H PRN Katsadouros, Vasilios, MD      . alteplase (CATHFLO ACTIVASE) injection 2 mg  2 mg Intracatheter Once PRN Reesa Chew, MD      . azithromycin [facility]) tablet 500 mg  500 mg Oral Daily Katsadouros, Vasilios, MD   500 mg at 10/30/20 1018  . cefTRIAXone (ROCEPHIN) 2 g in sodium chloride 0.9 % 100 mL IVPB  2 g Intravenous Daily Katsadouros, Vasilios, MD      . Chlorhexidine Gluconate Cloth 2 % PADS 6 each  6 each Topical Q0600 Reesa Chew, MD   6 each at 10/30/20 0617  . [START ON 10/31/2020] cinacalcet (SENSIPAR) tablet 180 mg  180 mg Oral Q M,W,F Reesa Chew, MD      .  dexamethasone (DECADRON) injection 4 mg  4 mg Intravenous Q8H Floyd, Dan, DO   4 mg at 10/30/20 1937  . [START ON 10/31/2020] doxercalciferol (HECTOROL) capsule 5 mcg  5 mcg Oral Once per day on Mon Wed Fri Pria Klosinski J, MD      . heparin injection 1,000 Units  1,000 Units Dialysis PRN Reesa Chew, MD      . labetalol (NORMODYNE) injection 5 mg  5 mg Intravenous Q2H PRN Katsadouros, Vasilios, MD      . lidocaine  (PF) (XYLOCAINE) 1 % injection 5 mL  5 mL Intradermal PRN Reesa Chew, MD      . lidocaine-prilocaine (EMLA) cream 1 application  1 application Topical PRN Reesa Chew, MD      . pentafluoroprop-tetrafluoroeth (GEBAUERS) aerosol 1 application  1 application Topical PRN Reesa Chew, MD          Review of Systems: 10 systems reviewed and negative except per interval history/subjective  Physical Exam: Vitals:   10/30/20 0341 10/30/20 0834  BP: (!) 171/91 (!) 141/77  Pulse: 90 89  Resp: 16 17  Temp: 98.5 F (36.9 C) 98.6 F (37 C)  SpO2: 98% 96%   No intake/output data recorded.  Intake/Output Summary (Last 24 hours) at 10/30/2020 1019 Last data filed at 10/30/2020 0145 Gross per 24 hour  Intake 120 ml  Output --  Net 120 ml   Constitutional: well-appearing, no acute distress ENMT: ears and nose without scars or lesions, MMM CV: normal rate, no edema Respiratory: clear to auscultation, normal work of breathing Gastrointestinal: soft, non-tender, no palpable masses or hernias Skin: no visible lesions or rashes Psych: Awake, alert, answers most questions appropriately   Test Results I personally reviewed new and old clinical labs and radiology tests Lab Results  Component Value Date   NA 138 10/30/2020   K 4.9 10/30/2020   CL 94 (L) 10/30/2020   CO2 29 10/30/2020   BUN 62 (H) 10/30/2020   CREATININE 10.63 (H) 10/30/2020   CALCIUM 8.7 (L) 10/30/2020   ALBUMIN 2.9 (L) 10/30/2020   PHOS 7.6 (H) 10/30/2020

## 2020-10-30 NOTE — Progress Notes (Signed)
HD#1 Subjective:  Overnight Events: No reported overnight events  Patient reports she is feeling better today. She feels more alert and awake. States cough is occurring less frequently, but is still productive of clear sputum. States she is breathing more easily and is no longer requiring oxygen.   She reports she had severe HA at dialysis yesterday and began feeling very sleepy and kept nodding off. Her mother says this happened to her a couple of months ago while she was driving, she stopped at a stop sign and nodded off, she woke up and was able to drive home.     Currently denies HA, vision change, dizziness, lightheadedness, fever, chills, CP, abdominal pain, change in bowels, dark or tarry stools.   Objective:  Vital signs in last 24 hours: Vitals:   10/30/20 0145 10/30/20 0341 10/30/20 0834 10/30/20 1114  BP: (!) 174/93 (!) 171/91 (!) 141/77 (!) 167/90  Pulse: 89 90 89 90  Resp: 20 16 17 19   Temp: 98.6 F (37 C) 98.5 F (36.9 C) 98.6 F (37 C) 98.6 F (37 C)  TempSrc: Oral Oral Oral Oral  SpO2: 98% 98% 96% 94%   Supplemental O2: Room Air   Physical Exam:  Constitutional: well-appearing female sitting in bed, in no acute distress HENT: normocephalic atraumatic, mucous membranes moist Eyes: conjunctiva non-erythematous, bilateral periorbital edema, sclera aninteric Neck: supple Cardiovascular: regular rate and rhythm, no m/r/g Pulmonary/Chest: normal work of breathing on room air, lungs with mild crackles heard bilateral  Abdominal: soft, non-tender, non-distended MSK: normal bulk and tone Neurological: alert & oriented x 3, cranial nerves II-XII grossly intact, no pronator drift, finger to nose appropriate bilateral, 5/5 strength in bilateral upper and lower extremities Skin: warm and dry Psych: Appropriate   There were no vitals filed for this visit.   Intake/Output Summary (Last 24 hours) at 10/30/2020 1237 Last data filed at 10/30/2020 0145 Gross per 24 hour   Intake 120 ml  Output --  Net 120 ml   Net IO Since Admission: 120 mL [10/30/20 1237]  Pertinent Labs: CBC Latest Ref Rng & Units 10/30/2020 10/29/2020 10/29/2020  WBC 4.0 - 10.5 K/uL 6.3 - 10.4  Hemoglobin 12.0 - 15.0 g/dL 7.8(L) 9.5(L) 9.2(L)  Hematocrit 36.0 - 46.0 % 23.3(L) 28.0(L) 28.8(L)  Platelets 150 - 400 K/uL 100(L) - 137(L)    CMP Latest Ref Rng & Units 10/30/2020 10/29/2020 10/29/2020  Glucose 70 - 99 mg/dL 184(H) - 100(H)  BUN 6 - 20 mg/dL 62(H) - 42(H)  Creatinine 0.44 - 1.00 mg/dL 10.63(H) - 8.27(H)  Sodium 135 - 145 mmol/L 138 136 137  Potassium 3.5 - 5.1 mmol/L 4.9 4.0 4.1  Chloride 98 - 111 mmol/L 94(L) - 95(L)  CO2 22 - 32 mmol/L 29 - 27  Calcium 8.9 - 10.3 mg/dL 8.7(L) - 9.4  Total Protein 6.5 - 8.1 g/dL - - 6.7  Total Bilirubin 0.3 - 1.2 mg/dL - - 1.2  Alkaline Phos 38 - 126 U/L - - 256(H)  AST 15 - 41 U/L - - 30  ALT 0 - 44 U/L - - 20    Imaging: MR BRAIN WO CONTRAST  Result Date: 10/29/2020 CLINICAL DATA:  Brain mass EXAM: MRI HEAD WITHOUT CONTRAST TECHNIQUE: Multiplanar, multiecho pulse sequences of the brain and surrounding structures were obtained without intravenous contrast. COMPARISON:  Head CT 10/29/2020 FINDINGS: Brain: There is an extra-axial mass of the anterior cranial fossa measuring 4.2 x 3.7 x 2.8 cm. An adjacent cyst measures 2.0  x 2.6 cm. There is severe vasogenic edema throughout both frontal lobes, left worse than right. No hydrocephalus. Vascular: Major flow voids are preserved. Skull and upper cervical spine: Normal calvarium and skull base. Visualized upper cervical spine and soft tissues are normal. Sinuses/Orbits:No paranasal sinus fluid levels or advanced mucosal thickening. Bilateral mastoid effusions. Nasopharynx is clear. Normal orbits. IMPRESSION: 4.2 x 3.7 x 2.8 cm meningioma of the anterior cranial fossa causing severe vasogenic edema throughout both frontal lobes, left worse than right. Electronically Signed   By: Ulyses Jarred M.D.    On: 10/29/2020 21:52    Assessment/Plan:   Active Problems:   Encephalopathy   Patient Summary: Ruth Gutierrez is a 55 y.o. with a pertinent PMH of ESRD on dialysis (MWF) s/p failed kidney transplant (2010), HTN who presented with shortness of breath, AMS and admitted for meningioma with significant vasogenic anemia.   1. Intercranial mass, HA: Increased somnolence, worsening frontal HA occurring every other day, denies ataxia, vision changes. MRI confirms meningioma with severe vasogenic edema.  -Appreciate neurosurgery consult and recommendations  -Dexamethasone 4mg , Q8H IV -Per neurosurgery, continue steroids to reduce edema and surgery later this week  2.  Shortness of breath: Worsening over the last couple of days, productive cough, subjective fevers, ~20 pack year smoking history, no reported chronic lung disease, lactic acid resolved to 1.1.  Patient with periorbital edema, otherwise appears euvolemic on exam. BNP 2,428, respiratory panel negative, pro-calcitonin 0.79. Lungs sounded significantly better today, could be a result of steroids. Elevated pro-calcitonin still raised concern for CAP. Will treat for CAP and check CXR after hemodialysis, if infiltrates resolve will consider d/c abx.  -Discontinue cefepime 1 g qd -Start azithromycin 500 mg qd, ceftriaxone 2 g qd  -Follow blood cultures   -Continuous pulse ox monitoring, O2 to keep O2 stat >92%  3. ESRD on dialysis (MWF) Cr 10.63 w/ BUN 62, phos 7.6 all increased from yesterday, albumin 2.9, down from yesterday.  -Appreciate nephrology consult and recommendations -As per nephrology, gentle hemodialysis planned for today and tomorrow  4.  Severe symptomatic hypertension BP 244/101 on admission now 620-355H systolic. Takes amlodipine 10 mg at home, states is inconsistent with medication adherence.  -Restart amlodipine 10 mg today -Permissive HTN due to intercranial mass  -Labetalol 5 mg PRN for BP over  180/80 -Consider increasing home meds prior to discharge   5. Anemia of chronic disease Hgb 7.8 down from 9.5 yesterday, could be dilutional or from bleeding yesterday from fistula site. Reticulocyte count 3.8, smear was unremarkable.  -Recheck CBC this afternoon  -Continue mircera 75 mcg q 2 weeks  -Continue iron 50 mg weekly   6. Secondary hyperparathyroidism, hyperphosphatemia  Phosphorus elevated at 6.7 -Follow phosphorus  -Sensipar 180 mg and hecerol 5 mcg w/dialysis     Diet: Renal IVF: None,None VTE:  SCDs Code: Full PT/OT recs: None, none. Family Update: Mother was on phone during bedside rounds    Dispo: Anticipated discharge to Home in >5 days pending surgery for meningioma.   Ruth Gutierrez, medical student

## 2020-10-30 NOTE — ED Notes (Signed)
Attempted to given report on patient at this time

## 2020-10-30 NOTE — Progress Notes (Signed)
Date: 10/30/2020  Patient name: Natelie Ostrosky [id number]  Medical record number: [id number]  Date of birth: 1965/11/26   I have seen and evaluated Broadus John and discussed their care with the Residency Team.  In brief, patient is a 55 year old female with a past medical history of ESRD on hemodialysis, status post failed renal transplant, anemia of chronic disease, hypertension who presented to the ED with increased somnolence noted at dialysis.  Patient states that over the last couple of days she has noted progressively worsening shortness of breath associated with a productive cough and clear phlegm.  She also had associated weakness.  Per chart, patient's mother reported that she had been more lethargic than normal and was not at her baseline for the last few weeks.  Patient also complains of severe headaches over the last couple of months for which she takes ibuprofen.  This relieves her pain.  Headache was frequent (occurring every other day) and localized to the front of her head.  Patient was also on amlodipine for her hypertension with only takes it 5 days a week.  No chest pain, no palpitations, no lightheadedness, no syncope, no focal weakness, no tingling or numbness, no blurry vision, no fevers or chills.  Today, patient states that she feels a little better and that her shortness of breath and cough have improved.  PMHx, Fam Hx, and/or Soc Hx : As per resident admit note  Vitals:   10/30/20 0834 10/30/20 1114  BP: (!) 141/77 (!) 167/90  Pulse: 89 90  Resp: 17 19  Temp: 98.6 F (37 C) 98.6 F (37 C)  SpO2: 96% 94%   General: Awake, alert, oriented x3, NAD CVS: Regular rhythm, normal heart sounds Lungs: Mild bibasilar crackles noted, no wheezes Abdomen: Soft, nontender, nondistended, normoactive bowel sounds Extremities: No edema noted, nontender to palpation Psych: Normal mood and affect HEENT: Normocephalic, atraumatic, pupils equally round and reactive to  light Skin: Warm and dry Neuro: Power 5 out of 5 bilateral upper and lower extremities, sensation intact, finger-to-nose intact, shoulder shrug intact, tongue and uvula central, no pronator drift noted, extraocular movements intact  Assessment and Plan: I have seen and evaluated the patient as outlined above. I agree with the formulated Assessment and Plan as detailed in the residents' note, with the following changes:   1.  Acute hypoxic respiratory failure in the setting of hypertensive urgency: -Patient presented to ED with progressively worsening shortness of breath, increased somnolence, productive cough and subjective fevers over the last few days.  Chest x-ray was consistent with bibasilar pneumonia versus pulmonary edema.  Patient was noted to have an elevated procalcitonin which is consistent with pneumonia over pulmonary edema.  This would also be consistent with her worsening somnolence .Patient also noted to have elevated BNP and has ESRD on hemodialysis and was unable to complete hemodialysis session prior to admission and may have a combination of bibasilar pneumonia and pulmonary edema.  Patient was noted to be hypoxic in the ED with O2 sats in the 80s requiring O2 via nasal cannula. -Patient was also noted to have severely elevated systolic blood pressures in the 240s on admission.  I suspect this may have contributed to worsening pulmonary edema and shortness of breath.  Patient is only inconsistently compliant with her amlodipine.  Given her intracranial mass will allow for permissive hypertension and hold her amlodipine for now.  We will continue with labetalol as needed for systolics over 262.  Currently systolic blood pressures have improved to  the 160s.  We will continue continue to monitor closely.  It is also possible that disequilibrium with cerebral edema secondary to her elevated blood pressures was the reason for her worsening mental status on dialysis. -Continue with  hemodialysis per nephrology -Respiratory viral panel was negative -Continue ceftriaxone and azithromycin for now -We will follow blood cultures -Patient scheduled for hemodialysis tomorrow.  Will follow up chest x-ray post hemodialysis to see if the infiltrates have resolved which would be more consistent with pulmonary edema. -We will continue O2 supplementation as needed but hypoxia has now resolved -No further work-up at this time  2.  Meningioma: -Patient did have imaging of her brain including a CT head and an MRI brain which was consistent with a large meningioma in the anterior cranial fossa with surrounding vasogenic edema. -Neurosurgery follow-up recommendations appreciated -We will continue with Decadron per neurosurgery -Patient will likely need meningioma resection possibly on Thursday or Friday -We will continue to monitor closely  Aldine Contes, MD 3/29/20222:30 PM

## 2020-10-30 NOTE — Progress Notes (Signed)
Patient ID: Ruth Gutierrez, female   DOB: 06-04-1966, 55 y.o.   MRN: [id number] Vital signs are stable Surgery is planned for Thursday with Dr. Reatha Armour We will continue to monitor patient in preparation for surgery.

## 2020-10-30 NOTE — ED Notes (Signed)
Pat to floor with ED tech

## 2020-10-30 NOTE — ED Notes (Signed)
Attempted to give report at this time 

## 2020-10-31 ENCOUNTER — Inpatient Hospital Stay (HOSPITAL_COMMUNITY): Payer: Medicare Other

## 2020-10-31 DIAGNOSIS — I120 Hypertensive chronic kidney disease with stage 5 chronic kidney disease or end stage renal disease: Secondary | ICD-10-CM

## 2020-10-31 DIAGNOSIS — N2581 Secondary hyperparathyroidism of renal origin: Secondary | ICD-10-CM

## 2020-10-31 DIAGNOSIS — R600 Localized edema: Secondary | ICD-10-CM

## 2020-10-31 DIAGNOSIS — D631 Anemia in chronic kidney disease: Secondary | ICD-10-CM

## 2020-10-31 DIAGNOSIS — R519 Headache, unspecified: Secondary | ICD-10-CM

## 2020-10-31 LAB — CBC WITH DIFFERENTIAL/PLATELET
Abs Immature Granulocytes: 0.06 10*3/uL (ref 0.00–0.07)
Basophils Absolute: 0 10*3/uL (ref 0.0–0.1)
Basophils Relative: 0 %
Eosinophils Absolute: 0 10*3/uL (ref 0.0–0.5)
Eosinophils Relative: 0 %
HCT: 23.3 % — ABNORMAL LOW (ref 36.0–46.0)
Hemoglobin: 7.8 g/dL — ABNORMAL LOW (ref 12.0–15.0)
Immature Granulocytes: 1 %
Lymphocytes Relative: 6 %
Lymphs Abs: 0.5 10*3/uL — ABNORMAL LOW (ref 0.7–4.0)
MCH: 31.5 pg (ref 26.0–34.0)
MCHC: 33.5 g/dL (ref 30.0–36.0)
MCV: 94 fL (ref 80.0–100.0)
Monocytes Absolute: 0.5 10*3/uL (ref 0.1–1.0)
Monocytes Relative: 6 %
Neutro Abs: 7.1 10*3/uL (ref 1.7–7.7)
Neutrophils Relative %: 87 %
Platelets: 107 10*3/uL — ABNORMAL LOW (ref 150–400)
RBC: 2.48 MIL/uL — ABNORMAL LOW (ref 3.87–5.11)
RDW: 16.5 % — ABNORMAL HIGH (ref 11.5–15.5)
WBC: 8.2 10*3/uL (ref 4.0–10.5)
nRBC: 0 % (ref 0.0–0.2)

## 2020-10-31 LAB — RENAL FUNCTION PANEL
Albumin: 3 g/dL — ABNORMAL LOW (ref 3.5–5.0)
Anion gap: 13 (ref 5–15)
BUN: 43 mg/dL — ABNORMAL HIGH (ref 6–20)
CO2: 26 mmol/L (ref 22–32)
Calcium: 9.7 mg/dL (ref 8.9–10.3)
Chloride: 96 mmol/L — ABNORMAL LOW (ref 98–111)
Creatinine, Ser: 7.72 mg/dL — ABNORMAL HIGH (ref 0.44–1.00)
GFR, Estimated: 6 mL/min — ABNORMAL LOW (ref >60)
Glucose, Bld: 126 mg/dL — ABNORMAL HIGH (ref 70–99)
Phosphorus: 7.2 mg/dL — ABNORMAL HIGH (ref 2.5–4.6)
Potassium: 4.7 mmol/L (ref 3.5–5.1)
Sodium: 135 mmol/L (ref 135–145)

## 2020-10-31 LAB — PATHOLOGIST SMEAR REVIEW

## 2020-10-31 LAB — BASIC METABOLIC PANEL
Anion gap: 11 (ref 5–15)
BUN: 28 mg/dL — ABNORMAL HIGH (ref 6–20)
CO2: 29 mmol/L (ref 22–32)
Calcium: 8.5 mg/dL — ABNORMAL LOW (ref 8.9–10.3)
Chloride: 97 mmol/L — ABNORMAL LOW (ref 98–111)
Creatinine, Ser: 5.06 mg/dL — ABNORMAL HIGH (ref 0.44–1.00)
GFR, Estimated: 10 mL/min — ABNORMAL LOW (ref >60)
Glucose, Bld: 146 mg/dL — ABNORMAL HIGH (ref 70–99)
Potassium: 4.5 mmol/L (ref 3.5–5.1)
Sodium: 137 mmol/L (ref 135–145)

## 2020-10-31 LAB — CBC
HCT: 25.9 % — ABNORMAL LOW (ref 36.0–46.0)
Hemoglobin: 8.4 g/dL — ABNORMAL LOW (ref 12.0–15.0)
MCH: 30.9 pg (ref 26.0–34.0)
MCHC: 32.4 g/dL (ref 30.0–36.0)
MCV: 95.2 fL (ref 80.0–100.0)
Platelets: 155 10*3/uL (ref 150–400)
RBC: 2.72 MIL/uL — ABNORMAL LOW (ref 3.87–5.11)
RDW: 16.4 % — ABNORMAL HIGH (ref 11.5–15.5)
WBC: 11.5 10*3/uL — ABNORMAL HIGH (ref 4.0–10.5)
nRBC: 0 % (ref 0.0–0.2)

## 2020-10-31 LAB — APTT: aPTT: 35 seconds (ref 24–36)

## 2020-10-31 LAB — PROTIME-INR
INR: 1.4 — ABNORMAL HIGH (ref 0.8–1.2)
Prothrombin Time: 16.4 seconds — ABNORMAL HIGH (ref 11.4–15.2)

## 2020-10-31 LAB — MAGNESIUM: Magnesium: 2.5 mg/dL — ABNORMAL HIGH (ref 1.7–2.4)

## 2020-10-31 LAB — HEPATITIS B SURFACE ANTIGEN: Hepatitis B Surface Ag: NONREACTIVE

## 2020-10-31 LAB — PREPARE RBC (CROSSMATCH)

## 2020-10-31 IMAGING — DX DG CHEST 1V
1 series · 1 of 1 positions shown · non-contrast
Comparison: [DATE]

CLINICAL DATA: Cough

EXAM:
CHEST  1 VIEW

[chest ap]
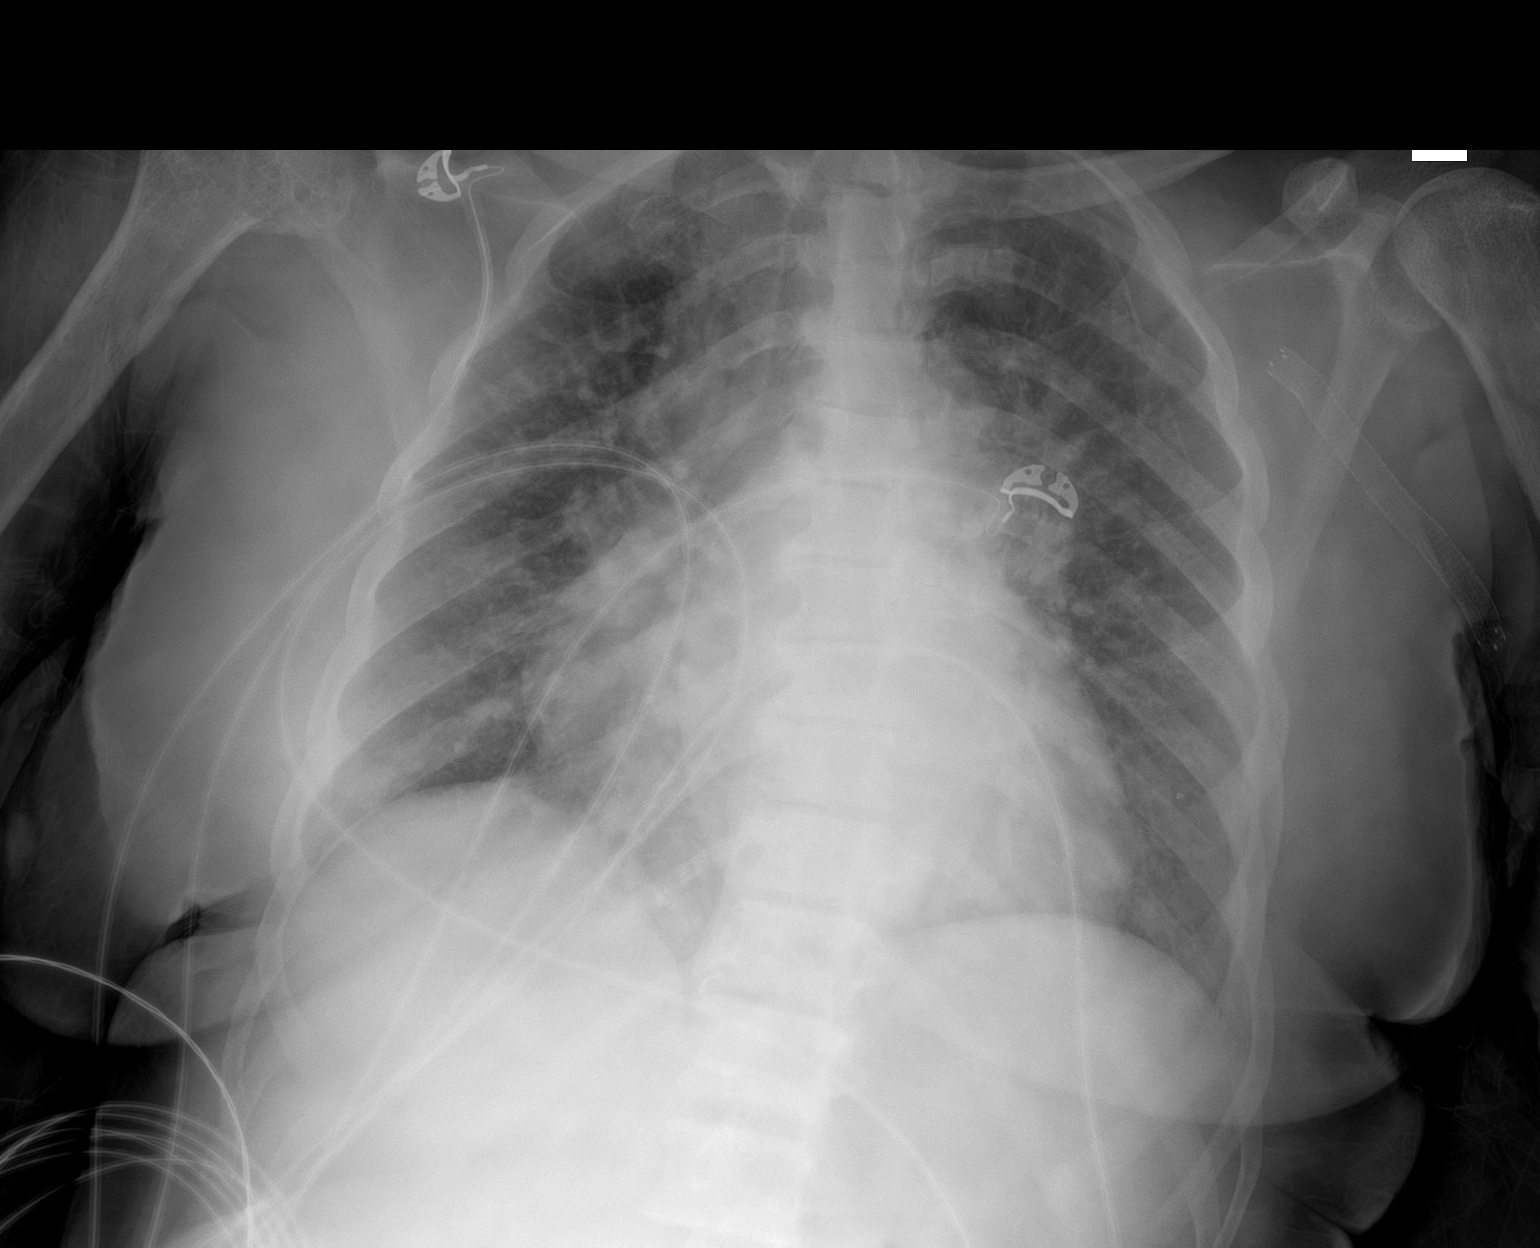

[1 of 1 positions shown; findings below may reference images not displayed]

FINDINGS: The lungs are symmetrically well expanded. Previously noted mild
pulmonary edema has improved in the interval, though central
pulmonary vascular engorgement persists. No pneumothorax or pleural
effusion. Mild cardiomegaly is present. Vascular stent noted within
the left axilla. No acute bone abnormality.
IMPRESSION: Improving pulmonary edema. Persistent central pulmonary vascular
congestion.

## 2020-10-31 MED ORDER — SODIUM CHLORIDE 0.9% IV SOLUTION: Freq: Once | INTRAVENOUS | Status: DC

## 2020-10-31 MED ORDER — VANCOMYCIN HCL 1000 MG/200ML IV SOLN
1000.0000 mg | INTRAVENOUS | Status: DC
  Filled 2020-10-31: qty 200

## 2020-10-31 MED ORDER — DARBEPOETIN ALFA 100 MCG/0.5ML IJ SOSY
100.0000 ug | PREFILLED_SYRINGE | INTRAMUSCULAR | Status: DC
  Filled 2020-10-31: qty 0.5

## 2020-10-31 MED ORDER — HEPARIN SODIUM (PORCINE) 5000 UNIT/ML IJ SOLN
5000.0000 [IU] | Freq: Three times a day (TID) | INTRAMUSCULAR | Status: AC
  Filled 2020-10-31: qty 1

## 2020-10-31 MED ORDER — METOPROLOL TARTRATE 25 MG PO TABS: 25.0000 mg | ORAL_TABLET | Freq: Two times a day (BID) | ORAL | Status: DC

## 2020-10-31 NOTE — Progress Notes (Signed)
   Providing Compassionate, Quality Care - Together  NEUROSURGERY PROGRESS NOTE   S: No issues overnight.  No complaints at this time  O: EXAM:  BP (!) 179/99 (BP Location: Right Arm)   Pulse 90   Temp 99 F (37.2 C) (Oral)   Resp 18   Wt 76.7 kg Comment: stood to scale  LMP 06/02/2015   SpO2 96%   BMI 26.48 kg/m   Awake, alert, oriented  Speech fluent, appropriate  PERRLA EOMI CNs grossly intact  5/5 BUE/BLE  Left upper extremity AV fistula  ASSESSMENT:  55 y.o. female with  1.  Large anterior skull base tumor, likely meningioma with significant bifrontal edema  PLAN: -Operating room tomorrow at 1245 with Dr. Marcello Moores and myself -N.p.o. at midnight -We will have blood and platelets on hold as she is chronically thrombocytopenic and anemic -All risks, benefits and expected outcomes were discussed with the patient at bedside, I will also reach out to her mother and discussed again.    Thank you for allowing me to participate in this patient's care.  Please do not hesitate to call with questions or concerns.   Elwin Sleight, [facility] Cell: [phone]

## 2020-10-31 NOTE — Progress Notes (Signed)
Nephrology Follow-Up Consult note   Assessment/Recommendations: Ruth Gutierrez is a/an 55 y.o. female with a past medical history significant for ESRD, admitted for AMS due to intracranial mass.     [facility] MonWedFri, 4 hrs 0 min, 180NRe Optiflux, BFR 400, DFR Manual 500 mL/min, EDW 73.5 (kg), Dialysate 2.0 K, 2.0 Ca, Sodium 137 (mEq/L), Bicarb Setting: 35 (mEq/L), Access: AVFistula Sensipar 180 MWF, hecterol 5 MWF, iron 50 weekly, mircera 75 q2 wks (due April 4)  # ESRD:  MWF.  Gentle dialysis yesterday tolerated well.  Plan for dialysis again today.  Monitor closely for disequilibrium given her intracranial pathology # Volume/ hypertension: EDW 73.5kg. Careful UF due to reasons as above. Likely challenge EDW as able.  Blood pressure up and down # Anemia of Chronic Kidney Disease: Hemoglobin stable today at 7.8.  CTM. Currently receiving mircera 25mcg q2 weeks and iron 50mg  weekly.  Will order Aranesp here # Secondary Hyperparathyroidism/Hyperphosphatemia: Continue home sensipar 180mg  and hecterol 4mcg w/ dialysis. On velphoro 1000 w/ meals. # Vascular access: LUE AVF w/ thrill and pulsatility. Prolonged bleeding on admission now resolved # AMS/Intracranial mass, meningioma w/ severe vasogenic edema: likely cause of AMS and explains why worse on HD due to osmotic change/fluid shifts. Neurosurgery with plan for surgery Thursday. AMS much improved on steroids # PNA?: concern on CXR. Possible aspiration given AMS but feel this more likely represents pulmonary edema. Defer antibiotics to primary team  # Additional recommendations: - Dose all meds for creatinine clearance <10 ml/min  - Unless absolutely necessary, no MRIs with gadolinium.  - Implement save arm precautions. Prefer needle sticks in the dorsum of the hands or wrists. No blood pressure measurements in arm. - If blood transfusion is requested during hemodialysis sessions, please alert [place] prior to the session.      Recommendations conveyed to primary service.    [facility] 10/31/2020 9:46 AM  ___________________________________________________________  CC: ESRD, AMS  Interval History/Subjective: Patient tolerated dialysis yesterday with no issues.  States she feels well.  Planning for surgery tomorrow   Medications:  Current Facility-Administered Medications  Medication Dose Route Frequency Provider Last Rate Last Admin  . acetaminophen (TYLENOL) tablet 650 mg  650 mg Oral Q6H PRN Katsadouros, Vasilios, MD       Or  . acetaminophen (TYLENOL) suppository 650 mg  650 mg Rectal Q6H PRN Katsadouros, Vasilios, MD      . azithromycin (ZITHROMAX) tablet 500 mg  500 mg Oral Daily Katsadouros, Vasilios, MD   500 mg at 10/30/20 1018  . cefTRIAXone (ROCEPHIN) 2 g in sodium chloride 0.9 % 100 mL IVPB  2 g Intravenous Daily Katsadouros, Vasilios, MD 200 mL/hr at 10/30/20 1018 2 g at 10/30/20 1018  . Chlorhexidine Gluconate Cloth 2 % PADS 6 each  6 each Topical Q0600 Reesa Chew, MD   6 each at 10/31/20 [phone]  . cinacalcet (SENSIPAR) tablet 180 mg  180 mg Oral Q M,W,F Reesa Chew, MD      . dexamethasone (DECADRON) injection 4 mg  4 mg Intravenous Q8H Deno Etienne, DO   4 mg at 10/31/20 3335  . doxercalciferol (HECTOROL) capsule 5 mcg  5 mcg Oral Once per day on Mon Wed Fri Reesa Chew, MD      . labetalol (NORMODYNE) injection 5 mg  5 mg Intravenous Q2H PRN Riesa Pope, MD   5 mg at 10/30/20 1712  . sucroferric oxyhydroxide (VELPHORO) chewable tablet 1,000 mg  1,000  mg Oral TID WC Reesa Chew, MD   1,000 mg at 10/30/20 1805      Review of Systems: 10 systems reviewed and negative except per interval history/subjective  Physical Exam: Vitals:   10/31/20 0338 10/31/20 0728  BP: (!) 128/102 (!) 179/99  Pulse: 85 90  Resp: 17 18  Temp: 98.4 F (36.9 C) 99 F (37.2 C)  SpO2: 96% 96%   No intake/output data  recorded.  Intake/Output Summary (Last 24 hours) at 10/31/2020 0946 Last data filed at 10/31/2020 0000 Gross per 24 hour  Intake 480 ml  Output 2000 ml  Net -1520 ml   Constitutional: well-appearing, no acute distress ENMT: ears and nose without scars or lesions, MMM CV: normal rate, no edema Respiratory: clear to auscultation, normal work of breathing Gastrointestinal: soft, non-tender, no palpable masses or hernias Skin: no visible lesions or rashes Psych: Awake, alert, answers most questions appropriately   Test Results I personally reviewed new and old clinical labs and radiology tests Lab Results  Component Value Date   NA 135 10/31/2020   K 4.7 10/31/2020   CL 96 (L) 10/31/2020   CO2 26 10/31/2020   BUN 43 (H) 10/31/2020   CREATININE 7.72 (H) 10/31/2020   CALCIUM 9.7 10/31/2020   ALBUMIN 3.0 (L) 10/31/2020   PHOS 7.2 (H) 10/31/2020

## 2020-10-31 NOTE — Progress Notes (Addendum)
HD#2 Subjective:  Overnight Events: No acute overnight events reported   Patient is feeling better today. She was more alert at bedside rounds. She states her breathing is improved, she is still having some productive cough but mostly at night when lying down flat. She says she spoke with the neurosurgeon this morning and knows that surgery is scheduled for tomorrow.   She denies HA, dizziness, lightheadedness, change in vision, CP, SOB, abdominal pain or change in bowel.   Objective:  Vital signs in last 24 hours: Vitals:   10/31/20 0129 10/31/20 0338 10/31/20 0728 10/31/20 1223  BP: (!) 171/90 (!) 128/102 (!) 179/99 (!) 191/107  Pulse: 93 85 90 80  Resp: 18 17 18 17   Temp:  98.4 F (36.9 C) 99 F (37.2 C) 98.3 F (36.8 C)  TempSrc:  Axillary Oral Oral  SpO2: 94% 96% 96% 97%  Weight:       Supplemental O2: Room Air SpO2: 97 % O2 Flow Rate (L/min): 2 L/min   Physical Exam:  Constitutional: well-appearing female sitting in bed, in no acute distress HENT: normocephalic atraumatic, mucous membranes moist Eyes: conjunctiva non-erythematous Neck: supple Cardiovascular: regular rate and rhythm, no m/r/g Pulmonary/Chest: normal work of breathing on room air, lungs with mild crackles bilaterally  Abdominal: soft, non-tender, non-distended MSK: normal bulk and tone Neurological: alert & oriented x 3 Skin: warm and dry Psych: Appropriate   Filed Weights   10/30/20 2122 10/31/20 0021  Weight: 79.4 kg 76.7 kg     Intake/Output Summary (Last 24 hours) at 10/31/2020 1401 Last data filed at 10/31/2020 0000 Gross per 24 hour  Intake 480 ml  Output 2000 ml  Net -1520 ml   Net IO Since Admission: -1,400 mL [10/31/20 1401]  Pertinent Labs: CBC Latest Ref Rng & Units 10/31/2020 10/30/2020 10/30/2020  WBC 4.0 - 10.5 K/uL 8.2 8.5 6.3  Hemoglobin 12.0 - 15.0 g/dL 7.8(L) 7.7(L) 7.8(L)  Hematocrit 36.0 - 46.0 % 23.3(L) 23.0(L) 23.3(L)  Platelets 150 - 400 K/uL 107(L) 103(L)  100(L)    CMP Latest Ref Rng & Units 10/31/2020 10/30/2020 10/29/2020  Glucose 70 - 99 mg/dL 126(H) 184(H) -  BUN 6 - 20 mg/dL 43(H) 62(H) -  Creatinine 0.44 - 1.00 mg/dL 7.72(H) 10.63(H) -  Sodium 135 - 145 mmol/L 135 138 136  Potassium 3.5 - 5.1 mmol/L 4.7 4.9 4.0  Chloride 98 - 111 mmol/L 96(L) 94(L) -  CO2 22 - 32 mmol/L 26 29 -  Calcium 8.9 - 10.3 mg/dL 9.7 8.7(L) -  Total Protein 6.5 - 8.1 g/dL - - -  Total Bilirubin 0.3 - 1.2 mg/dL - - -  Alkaline Phos 38 - 126 U/L - - -  AST 15 - 41 U/L - - -  ALT 0 - 44 U/L - - -    Imaging: DG Chest 1 View  Result Date: 10/31/2020 CLINICAL DATA:  Cough EXAM: CHEST  1 VIEW COMPARISON:  10/29/2020 FINDINGS: The lungs are symmetrically well expanded. Previously noted mild pulmonary edema has improved in the interval, though central pulmonary vascular engorgement persists. No pneumothorax or pleural effusion. Mild cardiomegaly is present. Vascular stent noted within the left axilla. No acute bone abnormality. IMPRESSION: Improving pulmonary edema. Persistent central pulmonary vascular congestion. Electronically Signed   By: Fidela Salisbury MD   On: 10/31/2020 07:16    Assessment/Plan:   Active Problems:   Encephalopathy   Patient Summary: Ruth Gutierrez is a 55 y.o. with a pertinent PMH of ESRD  on dialysis (MWF) s/p failed kidney transplant (2010), HTN who presented with shortness of breath, AMS and admitted for meningioma with significant vasogenic anemia.   1. Intercranial mass, HA: Increased somnolence, worsening frontal HA occurring every other day, denies ataxia, vision changes. MRI confirms meningioma with severe vasogenic edema.  -Appreciate neurosurgery consult and recommendations  -Dexamethasone 4mg , Q8H IV -Per neurosurgery, surgery scheduled for tomorrow   2.Shortness of breath: Worsening over the last couple of days, productive cough, subjective fevers, ~20 pack year smoking history, no reported chronic lung disease,  lactic acid resolved to 1.1.Patient with periorbital edema, otherwise appears euvolemic on exam. BNP 2,428, respiratory panel negative. Lungs sounded significantly better today, could be a result of steroids. CXR today showed improvement of pulmonary edema. Echo from yesterday with EF of 60-65%, moderate concentric left ventricular hypertrophy, Grade 1 diastolic dysfunction. Due to reasuring CXR, will discontinue abx.  -Discontinue azithromycin 500 mg qd, ceftriaxone 2 g qd  -Follow blood cultures, no growth after 1 day  -Continuous pulse ox monitoring, O2 to keep O2 stat >92%  3. ESRD on dialysis (MWF) Cr 7.72 w/ BUN 43, phos 7.2 all decreased from yesterday, albumin 3.0. Received hemodialysis last night.  -Appreciate nephrology consult and recommendations -As per nephrology, gentle hemodialysis planned for today  4.Severe symptomatic hypertensionBP 244/101 on admission now 620-355H systolic. Takes amlodipine 10 mg at home, states is inconsistent with medication adherence.  -Continue to hold amlodipine 10 mg  -Permissive HTN due to intercranial mass  -Labetalol 5 mg PRN for BP over 180/80 -Consider increasing home meds prior to discharge   5. Anemia of chronic disease Hgb 7.8, same as yesterday, could be dilutional or from previous bleeding from fistula site. Reticulocyte count 3.8, smear was unremarkable.  -Recheck CBC this afternoon  -Continue mircera 75 mcg q 2 weeks  -Continue iron 50 mg weekly   6. Secondary hyperparathyroidism, hyperphosphatemia Phosphorus elevated at 6.7 -Follow phosphorus  -Sensipar 180 mg and hecerol 5 mcg w/dialysis   Diet: Renal IVF: None,None VTE: Heparin Code: Full PT/OT recs: None, none. Family Update: Family on phone during morning rounds   Dispo: Anticipated discharge to Home in> 2 days pending neurosurgery.  [place] Student  Pager [phone] Please contact the on call pager after 5 pm and on weekends at  [phone].   I have seen and examined the patient myself, and I have reviewed the note by student doctor. Merrilyn Puma, [place] [facility] and was present during the interview and physical exam.  Please see my separate H&P for additional findings, assessment, and plan.  [name] states she feels better today and that her breathing is improved.  Patient scheduled for tomorrow by neurosurgery for suspected meningioma.  Today her breathing improving and her chest x-ray with having much resolution of opacities, do not suspect that this is pneumonia at this time.  Will discontinue antibiotics and continue to monitor.  Patient will be n.p.o. at midnight also of note she has history of beta-hCG positive, this could be secondary to meningioma.   SignedRiesa Pope, MD 10/31/2020, 4:37 PM

## 2020-10-31 NOTE — Progress Notes (Signed)
Pt back from dialysis, settled in room. Ruth Gutierrez, Ruth Gutierrez

## 2020-10-31 NOTE — Plan of Care (Signed)

## 2020-10-31 NOTE — Progress Notes (Signed)
Patient ID: Ruth Gutierrez, female   DOB: 03/29/1966, 55 y.o.   MRN: [id number] Vital signs are stable Patient is alert and oriented Prepping for surgery tomorrow

## 2020-11-01 ENCOUNTER — Inpatient Hospital Stay (HOSPITAL_COMMUNITY)
Admission: EM | Disposition: A | Discharge status: Home Health | Payer: Self-pay | Source: Home / Self Care | Attending: Internal Medicine

## 2020-11-01 ENCOUNTER — Encounter (HOSPITAL_COMMUNITY): Payer: Self-pay | Admitting: Internal Medicine

## 2020-11-01 ENCOUNTER — Inpatient Hospital Stay (HOSPITAL_COMMUNITY): Payer: Medicare Other | Admitting: Certified Registered Nurse Anesthetist

## 2020-11-01 DIAGNOSIS — N186 End stage renal disease: Secondary | ICD-10-CM | POA: Diagnosis not present

## 2020-11-01 DIAGNOSIS — D72829 Elevated white blood cell count, unspecified: Secondary | ICD-10-CM

## 2020-11-01 DIAGNOSIS — R0902 Hypoxemia: Secondary | ICD-10-CM | POA: Diagnosis not present

## 2020-11-01 DIAGNOSIS — G9389 Other specified disorders of brain: Secondary | ICD-10-CM | POA: Diagnosis not present

## 2020-11-01 DIAGNOSIS — J9601 Acute respiratory failure with hypoxia: Secondary | ICD-10-CM | POA: Diagnosis not present

## 2020-11-01 HISTORY — PX: PLACEMENT OF LUMBAR DRAIN: SHX6028

## 2020-11-01 HISTORY — PX: CRANIOTOMY: SHX93

## 2020-11-01 LAB — POCT I-STAT 7, (LYTES, BLD GAS, ICA,H+H)
Acid-base deficit: 1 mmol/L (ref 0.0–2.0)
Bicarbonate: 22.9 mmol/L (ref 20.0–28.0)
Calcium, Ion: 1.02 mmol/L — ABNORMAL LOW (ref 1.15–1.40)
HCT: 27 % — ABNORMAL LOW (ref 36.0–46.0)
Hemoglobin: 9.2 g/dL — ABNORMAL LOW (ref 12.0–15.0)
O2 Saturation: 98 %
Potassium: 5.7 mmol/L — ABNORMAL HIGH (ref 3.5–5.1)
Sodium: 136 mmol/L (ref 135–145)
TCO2: 24 mmol/L (ref 22–32)
pCO2 arterial: 33.9 mmHg (ref 32.0–48.0)
pH, Arterial: 7.437 (ref 7.350–7.450)
pO2, Arterial: 102 mmHg (ref 83.0–108.0)

## 2020-11-01 LAB — BASIC METABOLIC PANEL
Anion gap: 11 (ref 5–15)
BUN: 44 mg/dL — ABNORMAL HIGH (ref 6–20)
CO2: 28 mmol/L (ref 22–32)
Calcium: 7.5 mg/dL — ABNORMAL LOW (ref 8.9–10.3)
Chloride: 97 mmol/L — ABNORMAL LOW (ref 98–111)
Creatinine, Ser: 6.69 mg/dL — ABNORMAL HIGH (ref 0.44–1.00)
GFR, Estimated: 7 mL/min — ABNORMAL LOW (ref >60)
Glucose, Bld: 139 mg/dL — ABNORMAL HIGH (ref 70–99)
Potassium: 5.2 mmol/L — ABNORMAL HIGH (ref 3.5–5.1)
Sodium: 136 mmol/L (ref 135–145)

## 2020-11-01 LAB — POCT I-STAT, CHEM 8
BUN: 50 mg/dL — ABNORMAL HIGH (ref 6–20)
BUN: 53 mg/dL — ABNORMAL HIGH (ref 6–20)
Calcium, Ion: 0.91 mmol/L — ABNORMAL LOW (ref 1.15–1.40)
Calcium, Ion: 1.02 mmol/L — ABNORMAL LOW (ref 1.15–1.40)
Chloride: 103 mmol/L (ref 98–111)
Chloride: 102 mmol/L (ref 98–111)
Creatinine, Ser: 7.4 mg/dL — ABNORMAL HIGH (ref 0.44–1.00)
Creatinine, Ser: 8.1 mg/dL — ABNORMAL HIGH (ref 0.44–1.00)
Glucose, Bld: 122 mg/dL — ABNORMAL HIGH (ref 70–99)
Glucose, Bld: 139 mg/dL — ABNORMAL HIGH (ref 70–99)
HCT: 19 % — ABNORMAL LOW (ref 36.0–46.0)
HCT: 28 % — ABNORMAL LOW (ref 36.0–46.0)
Hemoglobin: 6.5 g/dL — CL (ref 12.0–15.0)
Hemoglobin: 9.5 g/dL — ABNORMAL LOW (ref 12.0–15.0)
Potassium: 4.9 mmol/L (ref 3.5–5.1)
Potassium: 5.7 mmol/L — ABNORMAL HIGH (ref 3.5–5.1)
Sodium: 136 mmol/L (ref 135–145)
Sodium: 134 mmol/L — ABNORMAL LOW (ref 135–145)
TCO2: 23 mmol/L (ref 22–32)
TCO2: 24 mmol/L (ref 22–32)

## 2020-11-01 LAB — RETICULOCYTES
Immature Retic Fract: 9.9 % (ref 2.3–15.9)
RBC.: 2.71 MIL/uL — ABNORMAL LOW (ref 3.87–5.11)
Retic Count, Absolute: 86.7 10*3/uL (ref 19.0–186.0)
Retic Ct Pct: 3.2 % — ABNORMAL HIGH (ref 0.4–3.1)

## 2020-11-01 LAB — IRON AND TIBC
Iron: 94 ug/dL (ref 28–170)
Saturation Ratios: 48 % — ABNORMAL HIGH (ref 10.4–31.8)
TIBC: 196 ug/dL — ABNORMAL LOW (ref 250–450)
UIBC: 102 ug/dL

## 2020-11-01 LAB — CBC
HCT: 26.4 % — ABNORMAL LOW (ref 36.0–46.0)
Hemoglobin: 8.3 g/dL — ABNORMAL LOW (ref 12.0–15.0)
MCH: 30.6 pg (ref 26.0–34.0)
MCHC: 31.4 g/dL (ref 30.0–36.0)
MCV: 97.4 fL (ref 80.0–100.0)
Platelets: 141 10*3/uL — ABNORMAL LOW (ref 150–400)
RBC: 2.71 MIL/uL — ABNORMAL LOW (ref 3.87–5.11)
RDW: 16.3 % — ABNORMAL HIGH (ref 11.5–15.5)
WBC: 10.1 10*3/uL (ref 4.0–10.5)
nRBC: 0 % (ref 0.0–0.2)

## 2020-11-01 LAB — GLUCOSE, CAPILLARY: Glucose-Capillary: 192 mg/dL — ABNORMAL HIGH (ref 70–99)

## 2020-11-01 LAB — FERRITIN: Ferritin: 1080 ng/mL — ABNORMAL HIGH (ref 11–307)

## 2020-11-01 LAB — SURGICAL PCR SCREEN
MRSA, PCR: NEGATIVE
Staphylococcus aureus: NEGATIVE

## 2020-11-01 LAB — MRSA PCR SCREENING

## 2020-11-01 SURGERY — CRANIOTOMY TUMOR EXCISION
Anesthesia: General | Site: Head

## 2020-11-01 MED ORDER — ARTIFICIAL TEARS OPHTHALMIC OINT
TOPICAL_OINTMENT | OPHTHALMIC | Status: DC | PRN
  Administered 2020-11-01: 1 via OPHTHALMIC

## 2020-11-01 MED ORDER — PROMETHAZINE HCL 25 MG/ML IJ SOLN: 6.2500 mg | INTRAMUSCULAR | Status: DC | PRN

## 2020-11-01 MED ORDER — LIDOCAINE-EPINEPHRINE 1 %-1:100000 IJ SOLN
INTRAMUSCULAR | Status: DC | PRN
  Administered 2020-11-01: 7.5 mL

## 2020-11-01 MED ORDER — PHENYLEPHRINE HCL-NACL 10-0.9 MG/250ML-% IV SOLN: INTRAVENOUS | Status: DC | PRN

## 2020-11-01 MED ORDER — OXYCODONE HCL 5 MG PO TABS: 5.0000 mg | ORAL_TABLET | Freq: Once | ORAL | Status: DC | PRN

## 2020-11-01 MED ORDER — ESMOLOL HCL 100 MG/10ML IV SOLN
INTRAVENOUS | Status: AC
  Filled 2020-11-01: qty 10

## 2020-11-01 MED ORDER — MIDAZOLAM HCL 2 MG/2ML IJ SOLN
INTRAMUSCULAR | Status: AC
  Filled 2020-11-01: qty 2

## 2020-11-01 MED ORDER — ROCURONIUM BROMIDE 10 MG/ML (PF) SYRINGE
PREFILLED_SYRINGE | INTRAVENOUS | Status: DC | PRN
  Administered 2020-11-01: 50 mg via INTRAVENOUS
  Administered 2020-11-01: 30 mg via INTRAVENOUS
  Administered 2020-11-01: 20 mg via INTRAVENOUS
  Administered 2020-11-01: 70 mg via INTRAVENOUS
  Administered 2020-11-01: 30 mg via INTRAVENOUS
  Administered 2020-11-01: 15 mg via INTRAVENOUS
  Administered 2020-11-01: 20 mg via INTRAVENOUS
  Administered 2020-11-01: 30 mg via INTRAVENOUS

## 2020-11-01 MED ORDER — ROCURONIUM BROMIDE 10 MG/ML (PF) SYRINGE
PREFILLED_SYRINGE | INTRAVENOUS | Status: AC
  Filled 2020-11-01: qty 50

## 2020-11-01 MED ORDER — HEMOSTATIC AGENTS (NO CHARGE) OPTIME
TOPICAL | Status: DC | PRN
  Administered 2020-11-01: 1 via TOPICAL

## 2020-11-01 MED ORDER — BUPIVACAINE HCL (PF) 0.5 % IJ SOLN
INTRAMUSCULAR | Status: AC
  Filled 2020-11-01: qty 30

## 2020-11-01 MED ORDER — PROPOFOL 10 MG/ML IV BOLUS
INTRAVENOUS | Status: AC
  Filled 2020-11-01: qty 20

## 2020-11-01 MED ORDER — LIDOCAINE-EPINEPHRINE 1 %-1:100000 IJ SOLN
INTRAMUSCULAR | Status: AC
  Filled 2020-11-01: qty 1

## 2020-11-01 MED ORDER — DEXAMETHASONE SODIUM PHOSPHATE 10 MG/ML IJ SOLN
INTRAMUSCULAR | Status: AC
  Filled 2020-11-01: qty 3

## 2020-11-01 MED ORDER — LABETALOL HCL 5 MG/ML IV SOLN
INTRAVENOUS | Status: AC
  Filled 2020-11-01: qty 4

## 2020-11-01 MED ORDER — SODIUM CHLORIDE 0.9 % IV SOLN
INTRAVENOUS | Status: DC | PRN
Start: 2020-11-01 — End: 2020-11-01

## 2020-11-01 MED ORDER — DEXAMETHASONE SODIUM PHOSPHATE 10 MG/ML IJ SOLN
INTRAMUSCULAR | Status: DC | PRN
  Administered 2020-11-01: 10 mg via INTRAVENOUS

## 2020-11-01 MED ORDER — LIDOCAINE 2% (20 MG/ML) 5 ML SYRINGE
INTRAMUSCULAR | Status: AC
  Filled 2020-11-01: qty 15

## 2020-11-01 MED ORDER — FENTANYL CITRATE (PF) 100 MCG/2ML IJ SOLN
25.0000 ug | INTRAMUSCULAR | Status: DC | PRN
  Administered 2020-11-01 (×3): 50 ug via INTRAVENOUS

## 2020-11-01 MED ORDER — VANCOMYCIN HCL IN DEXTROSE 1-5 GM/200ML-% IV SOLN
INTRAVENOUS | Status: AC
  Administered 2020-11-01: 1000 mg
  Filled 2020-11-01: qty 200

## 2020-11-01 MED ORDER — PANTOPRAZOLE SODIUM 40 MG PO TBEC
40.0000 mg | DELAYED_RELEASE_TABLET | Freq: Every day | ORAL | Status: DC
  Administered 2020-11-03 – 2020-11-10 (×8): 40 mg via ORAL
  Filled 2020-11-01 (×8): qty 1

## 2020-11-01 MED ORDER — VANCOMYCIN HCL IN DEXTROSE 750-5 MG/150ML-% IV SOLN
750.0000 mg | Freq: Once | INTRAVENOUS | Status: DC
  Filled 2020-11-01: qty 150

## 2020-11-01 MED ORDER — SUGAMMADEX SODIUM 200 MG/2ML IV SOLN
INTRAVENOUS | Status: DC | PRN
  Administered 2020-11-01: 200 mg via INTRAVENOUS

## 2020-11-01 MED ORDER — FENTANYL CITRATE (PF) 100 MCG/2ML IJ SOLN
INTRAMUSCULAR | Status: AC
  Filled 2020-11-01: qty 2

## 2020-11-01 MED ORDER — THROMBIN 20000 UNITS EX SOLR
CUTANEOUS | Status: DC | PRN
  Administered 2020-11-01: 20 mL via TOPICAL

## 2020-11-01 MED ORDER — ONDANSETRON HCL 4 MG PO TABS: 4.0000 mg | ORAL_TABLET | ORAL | Status: DC | PRN

## 2020-11-01 MED ORDER — THROMBIN 5000 UNITS EX SOLR
CUTANEOUS | Status: AC
  Filled 2020-11-01: qty 10000

## 2020-11-01 MED ORDER — LIDOCAINE 2% (20 MG/ML) 5 ML SYRINGE
INTRAMUSCULAR | Status: DC | PRN
  Administered 2020-11-01: 60 mg via INTRAVENOUS

## 2020-11-01 MED ORDER — CHLORHEXIDINE GLUCONATE 0.12 % MT SOLN
15.0000 mL | OROMUCOSAL | Status: AC
  Filled 2020-11-01: qty 15

## 2020-11-01 MED ORDER — CALCIUM CHLORIDE 10 % IV SOLN
INTRAVENOUS | Status: DC | PRN
  Administered 2020-11-01 (×5): 100 mg via INTRAVENOUS

## 2020-11-01 MED ORDER — DOCUSATE SODIUM 100 MG PO CAPS
100.0000 mg | ORAL_CAPSULE | Freq: Two times a day (BID) | ORAL | Status: DC
  Administered 2020-11-04 – 2020-11-09 (×6): 100 mg via ORAL
  Filled 2020-11-01 (×11): qty 1

## 2020-11-01 MED ORDER — THROMBIN 5000 UNITS EX SOLR
OROMUCOSAL | Status: DC | PRN
  Administered 2020-11-01 (×2): 5 mL via TOPICAL

## 2020-11-01 MED ORDER — EPHEDRINE 5 MG/ML INJ
INTRAVENOUS | Status: AC
  Filled 2020-11-01: qty 20

## 2020-11-01 MED ORDER — SODIUM CHLORIDE 0.9 % IV SOLN: INTRAVENOUS | Status: DC

## 2020-11-01 MED ORDER — BUPIVACAINE HCL (PF) 0.5 % IJ SOLN
INTRAMUSCULAR | Status: DC | PRN
  Administered 2020-11-01: 7.5 mL

## 2020-11-01 MED ORDER — OXYCODONE HCL 5 MG/5ML PO SOLN: 5.0000 mg | Freq: Once | ORAL | Status: DC | PRN

## 2020-11-01 MED ORDER — THROMBIN 20000 UNITS EX KIT
PACK | CUTANEOUS | Status: AC
  Filled 2020-11-01: qty 2

## 2020-11-01 MED ORDER — BACITRACIN ZINC 500 UNIT/GM EX OINT
TOPICAL_OINTMENT | CUTANEOUS | Status: AC
  Filled 2020-11-01: qty 28.35

## 2020-11-01 MED ORDER — BACITRACIN ZINC 500 UNIT/GM EX OINT
TOPICAL_OINTMENT | CUTANEOUS | Status: DC | PRN
  Administered 2020-11-01: 1 via TOPICAL

## 2020-11-01 MED ORDER — SODIUM CHLORIDE 0.9 % IV SOLN
0.0500 ug/kg/min | INTRAVENOUS | Status: DC
  Administered 2020-11-01: .2 ug/kg/min via INTRAVENOUS
  Administered 2020-11-01: 11.325 ug via INTRAVENOUS
  Administered 2020-11-01: .1 ug/kg/min via INTRAVENOUS
  Filled 2020-11-01 (×3): qty 5000

## 2020-11-01 MED ORDER — FENTANYL CITRATE (PF) 100 MCG/2ML IJ SOLN
INTRAMUSCULAR | Status: AC
  Administered 2020-11-01: 25 ug via INTRAVENOUS
  Filled 2020-11-01: qty 2

## 2020-11-01 MED ORDER — FENTANYL CITRATE (PF) 100 MCG/2ML IJ SOLN
25.0000 ug | INTRAMUSCULAR | Status: DC | PRN
  Administered 2020-11-02 (×3): 25 ug via INTRAVENOUS
  Filled 2020-11-01 (×4): qty 2

## 2020-11-01 MED ORDER — PHENYLEPHRINE HCL-NACL 10-0.9 MG/250ML-% IV SOLN
INTRAVENOUS | Status: DC | PRN
  Administered 2020-11-01: 20 ug/min via INTRAVENOUS

## 2020-11-01 MED ORDER — 0.9 % SODIUM CHLORIDE (POUR BTL) OPTIME
TOPICAL | Status: DC | PRN
  Administered 2020-11-01 (×3): 1000 mL

## 2020-11-01 MED ORDER — ONDANSETRON HCL 4 MG/2ML IJ SOLN
INTRAMUSCULAR | Status: AC
  Filled 2020-11-01: qty 8

## 2020-11-01 MED ORDER — LABETALOL HCL 5 MG/ML IV SOLN
INTRAVENOUS | Status: DC | PRN
  Administered 2020-11-01 (×4): 5 mg via INTRAVENOUS

## 2020-11-01 MED ORDER — FENTANYL CITRATE (PF) 250 MCG/5ML IJ SOLN
INTRAMUSCULAR | Status: AC
  Filled 2020-11-01: qty 5

## 2020-11-01 MED ORDER — PROPOFOL 10 MG/ML IV BOLUS
INTRAVENOUS | Status: DC | PRN
  Administered 2020-11-01 (×3): 50 mg via INTRAVENOUS
  Administered 2020-11-01: 80 mg via INTRAVENOUS
  Administered 2020-11-01: 70 mg via INTRAVENOUS

## 2020-11-01 MED ORDER — LABETALOL HCL 5 MG/ML IV SOLN: 10.0000 mg | INTRAVENOUS | Status: DC | PRN

## 2020-11-01 MED ORDER — THROMBIN 5000 UNITS EX SOLR
CUTANEOUS | Status: AC
  Filled 2020-11-01: qty 5000

## 2020-11-01 MED ORDER — ENALAPRILAT 1.25 MG/ML IV SOLN
1.2500 mg | Freq: Four times a day (QID) | INTRAVENOUS | Status: DC | PRN
  Filled 2020-11-01 (×2): qty 1

## 2020-11-01 MED ORDER — SODIUM CHLORIDE 0.9 % IV SOLN: INTRAVENOUS | Status: DC | PRN

## 2020-11-01 MED ORDER — ONDANSETRON HCL 4 MG/2ML IJ SOLN
4.0000 mg | INTRAMUSCULAR | Status: DC | PRN
  Administered 2020-11-01: 4 mg via INTRAVENOUS
  Filled 2020-11-01: qty 2

## 2020-11-01 MED ORDER — APREPITANT 40 MG PO CAPS
40.0000 mg | ORAL_CAPSULE | Freq: Once | ORAL | Status: AC
  Administered 2020-11-01: 40 mg via ORAL
  Filled 2020-11-01: qty 1

## 2020-11-01 MED ORDER — CHLORHEXIDINE GLUCONATE CLOTH 2 % EX PADS
6.0000 | MEDICATED_PAD | Freq: Every day | CUTANEOUS | Status: DC
  Administered 2020-11-01 – 2020-11-08 (×6): 6 via TOPICAL

## 2020-11-01 MED ORDER — LEVETIRACETAM IN NACL 500 MG/100ML IV SOLN
500.0000 mg | Freq: Two times a day (BID) | INTRAVENOUS | Status: DC
  Administered 2020-11-01 – 2020-11-04 (×7): 500 mg via INTRAVENOUS
  Filled 2020-11-01 (×7): qty 100

## 2020-11-01 MED ORDER — PROMETHAZINE HCL 25 MG PO TABS
12.5000 mg | ORAL_TABLET | ORAL | Status: DC | PRN
Start: 2020-11-01 — End: 2020-11-10
  Filled 2020-11-01: qty 1

## 2020-11-01 MED ORDER — ONDANSETRON HCL 4 MG/2ML IJ SOLN
INTRAMUSCULAR | Status: DC | PRN
  Administered 2020-11-01: 4 mg via INTRAVENOUS

## 2020-11-01 MED ORDER — NICARDIPINE HCL IN NACL 20-0.86 MG/200ML-% IV SOLN: 3.0000 mg/h | INTRAVENOUS | Status: DC

## 2020-11-01 MED ORDER — CHLORHEXIDINE GLUCONATE 0.12 % MT SOLN
OROMUCOSAL | Status: AC
  Administered 2020-11-01: 15 mL via OROMUCOSAL
  Filled 2020-11-01: qty 15

## 2020-11-01 MED ORDER — CLEVIDIPINE BUTYRATE 0.5 MG/ML IV EMUL
0.0000 mg/h | INTRAVENOUS | Status: DC
  Administered 2020-11-01: 1 mg/h via INTRAVENOUS
  Administered 2020-11-01 – 2020-11-02 (×6): 21 mg/h via INTRAVENOUS
  Administered 2020-11-02: 20 mg/h via INTRAVENOUS
  Administered 2020-11-02: 12 mg/h via INTRAVENOUS
  Administered 2020-11-02: 21 mg/h via INTRAVENOUS
  Administered 2020-11-03: 6 mg/h via INTRAVENOUS
  Administered 2020-11-03: 10 mg/h via INTRAVENOUS
  Administered 2020-11-04: 6 mg/h via INTRAVENOUS
  Filled 2020-11-01 (×2): qty 100
  Filled 2020-11-01: qty 50
  Filled 2020-11-01 (×11): qty 100

## 2020-11-01 MED ORDER — ESMOLOL HCL 100 MG/10ML IV SOLN
INTRAVENOUS | Status: DC | PRN
  Administered 2020-11-01: 70 mg via INTRAVENOUS
  Administered 2020-11-01 (×2): 30 mg via INTRAVENOUS
  Administered 2020-11-01: 70 mg via INTRAVENOUS

## 2020-11-01 MED ORDER — PHENYLEPHRINE 40 MCG/ML (10ML) SYRINGE FOR IV PUSH (FOR BLOOD PRESSURE SUPPORT)
PREFILLED_SYRINGE | INTRAVENOUS | Status: DC | PRN
  Administered 2020-11-01: 80 ug via INTRAVENOUS

## 2020-11-01 MED ORDER — PHENYLEPHRINE 40 MCG/ML (10ML) SYRINGE FOR IV PUSH (FOR BLOOD PRESSURE SUPPORT)
PREFILLED_SYRINGE | INTRAVENOUS | Status: AC
  Filled 2020-11-01: qty 30

## 2020-11-01 MED ORDER — FENTANYL CITRATE (PF) 250 MCG/5ML IJ SOLN
INTRAMUSCULAR | Status: DC | PRN
  Administered 2020-11-01: 100 ug via INTRAVENOUS
  Administered 2020-11-01 (×3): 50 ug via INTRAVENOUS

## 2020-11-01 MED ORDER — LABETALOL HCL 5 MG/ML IV SOLN
10.0000 mg | INTRAVENOUS | Status: DC | PRN
  Administered 2020-11-02 – 2020-11-07 (×5): 10 mg via INTRAVENOUS
  Filled 2020-11-01 (×5): qty 4

## 2020-11-01 SURGICAL SUPPLY — 119 items
BAND RUBBER #18 3X1/16 STRL (MISCELLANEOUS) IMPLANT
BENZOIN TINCTURE PRP APPL 2/3 (GAUZE/BANDAGES/DRESSINGS) IMPLANT
BLADE CLIPPER SURG (BLADE) ×4 IMPLANT
BLADE SAW GIGLI 16 STRL (MISCELLANEOUS) IMPLANT
BLADE SURG 11 STRL SS (BLADE) ×4 IMPLANT
BLADE SURG 15 STRL LF DISP TIS (BLADE) IMPLANT
BLADE SURG 15 STRL SS (BLADE)
BNDG GAUZE ELAST 4 BULKY (GAUZE/BANDAGES/DRESSINGS) IMPLANT
BNDG STRETCH 4X75 STRL LF (GAUZE/BANDAGES/DRESSINGS) IMPLANT
BUR ACORN 9.0 PRECISION (BURR) ×3 IMPLANT
BUR ACORN 9.0MM PRECISION (BURR) ×1
BUR ROUND FLUTED 4 SOFT TCH (BURR) IMPLANT
BUR ROUND FLUTED 4MM SOFT TCH (BURR)
BUR SPIRAL ROUTER 2.3 (BUR) ×3 IMPLANT
BUR SPIRAL ROUTER 2.3MM (BUR) ×1
CANISTER SUCT 3000ML PPV (MISCELLANEOUS) ×12 IMPLANT
CATH VENTRIC 35X38 W/TROCAR LG (CATHETERS) IMPLANT
CLIP VESOCCLUDE MED 6/CT (CLIP) IMPLANT
CLOSURE WOUND 1/2 X4 (GAUZE/BANDAGES/DRESSINGS)
CNTNR URN SCR LID CUP LEK RST (MISCELLANEOUS) ×4 IMPLANT
CONT SPEC 4OZ STRL OR WHT (MISCELLANEOUS) ×8
COVER BURR HOLE 14 (Orthopedic Implant) ×8 IMPLANT
COVER BURR HOLE 20 W/TAB UNI (Plate) ×4 IMPLANT
COVER MAYO STAND STRL (DRAPES) IMPLANT
COVER WAND RF STERILE (DRAPES) ×8 IMPLANT
DECANTER SPIKE VIAL GLASS SM (MISCELLANEOUS) ×8 IMPLANT
DRAIN SUBARACHNOID (WOUND CARE) ×4 IMPLANT
DRAPE C-ARM 42X72 X-RAY (DRAPES) ×8 IMPLANT
DRAPE HALF SHEET 40X57 (DRAPES) ×4 IMPLANT
DRAPE LAPAROTOMY 100X72X124 (DRAPES) ×4 IMPLANT
DRAPE MICROSCOPE LEICA (MISCELLANEOUS) IMPLANT
DRAPE NEUROLOGICAL W/INCISE (DRAPES) ×4 IMPLANT
DRAPE STERI IOBAN 125X83 (DRAPES) IMPLANT
DRAPE SURG 17X23 STRL (DRAPES) ×4 IMPLANT
DRAPE WARM FLUID 44X44 (DRAPES) ×4 IMPLANT
DRSG ADAPTIC 3X8 NADH LF (GAUZE/BANDAGES/DRESSINGS) IMPLANT
DRSG AQUACEL AG ADV 3.5X 6 (GAUZE/BANDAGES/DRESSINGS) IMPLANT
DRSG AQUACEL AG ADV 3.5X14 (GAUZE/BANDAGES/DRESSINGS) ×4 IMPLANT
DRSG OPSITE 4X5.5 SM (GAUZE/BANDAGES/DRESSINGS) ×4 IMPLANT
DRSG OPSITE POSTOP 3X4 (GAUZE/BANDAGES/DRESSINGS) ×4 IMPLANT
DRSG TELFA 3X8 NADH (GAUZE/BANDAGES/DRESSINGS) IMPLANT
DURAPREP 26ML APPLICATOR (WOUND CARE) ×4 IMPLANT
DURAPREP 6ML APPLICATOR 50/CS (WOUND CARE) ×4 IMPLANT
ELECT COATED BLADE 2.86 ST (ELECTRODE) ×4 IMPLANT
ELECT REM PT RETURN 9FT ADLT (ELECTROSURGICAL) ×8
ELECTRODE REM PT RTRN 9FT ADLT (ELECTROSURGICAL) ×4 IMPLANT
EVACUATOR 1/8 PVC DRAIN (DRAIN) IMPLANT
EVACUATOR SILICONE 100CC (DRAIN) IMPLANT
FORCEPS BIPO MALIS IRRIG 9X1.5 (NEUROSURGERY SUPPLIES) ×4 IMPLANT
GAUZE 4X4 16PLY RFD (DISPOSABLE) IMPLANT
GAUZE SPONGE 4X4 12PLY STRL (GAUZE/BANDAGES/DRESSINGS) ×4 IMPLANT
GAUZE XEROFORM 1X8 LF (GAUZE/BANDAGES/DRESSINGS) IMPLANT
GLOVE BIO SURGEON STRL SZ7.5 (GLOVE) ×4 IMPLANT
GLOVE BIOGEL PI IND STRL 7.5 (GLOVE) ×6 IMPLANT
GLOVE BIOGEL PI INDICATOR 7.5 (GLOVE) ×6
GLOVE ECLIPSE 7.5 STRL STRAW (GLOVE) ×4 IMPLANT
GLOVE EXAM NITRILE LRG STRL (GLOVE) IMPLANT
GLOVE EXAM NITRILE XL STR (GLOVE) IMPLANT
GLOVE SRG 8 PF TXTR STRL LF DI (GLOVE) ×2 IMPLANT
GLOVE SS BIOGEL STRL SZ 6.5 (GLOVE) ×2 IMPLANT
GLOVE SUPERSENSE BIOGEL SZ 6.5 (GLOVE) ×2
GLOVE SURG LTX SZ7.5 (GLOVE) ×4 IMPLANT
GLOVE SURG UNDER POLY LF SZ7.5 (GLOVE) ×4 IMPLANT
GLOVE SURG UNDER POLY LF SZ8 (GLOVE) ×4
GOWN STRL REUS W/ TWL LRG LVL3 (GOWN DISPOSABLE) ×8 IMPLANT
GOWN STRL REUS W/ TWL XL LVL3 (GOWN DISPOSABLE) ×4 IMPLANT
GOWN STRL REUS W/TWL 2XL LVL3 (GOWN DISPOSABLE) IMPLANT
GOWN STRL REUS W/TWL LRG LVL3 (GOWN DISPOSABLE) ×16
GOWN STRL REUS W/TWL XL LVL3 (GOWN DISPOSABLE) ×8
GRAFT DURAGEN MATRIX 3WX3L (Graft) ×4 IMPLANT
GRAFT DURAGEN MATRIX 3X3 SNGL (Graft) ×2 IMPLANT
HEMOSTAT POWDER KIT SURGIFOAM (HEMOSTASIS) ×8 IMPLANT
HEMOSTAT SURGICEL 2X14 (HEMOSTASIS) ×8 IMPLANT
HEMOSTAT SURGICEL 2X4 FIBR (HEMOSTASIS) ×4 IMPLANT
HOOK DURA 1/2IN (MISCELLANEOUS) ×4 IMPLANT
HOOK RETRACTION 12 ELAST STAY (MISCELLANEOUS) ×24 IMPLANT
IV NS 1000ML (IV SOLUTION) ×4
IV NS 1000ML BAXH (IV SOLUTION) ×2 IMPLANT
KIT BASIN OR (CUSTOM PROCEDURE TRAY) ×8 IMPLANT
KIT DRAIN CSF ACCUDRAIN (MISCELLANEOUS) IMPLANT
KIT TURNOVER KIT B (KITS) ×8 IMPLANT
NEEDLE HYPO 22GX1.5 SAFETY (NEEDLE) ×8 IMPLANT
NEEDLE SPNL 18GX3.5 QUINCKE PK (NEEDLE) IMPLANT
NS IRRIG 1000ML POUR BTL (IV SOLUTION) ×16 IMPLANT
PACK CRANIOTOMY CUSTOM (CUSTOM PROCEDURE TRAY) ×4 IMPLANT
PACK LAMINECTOMY NEURO (CUSTOM PROCEDURE TRAY) ×4 IMPLANT
PAD ARMBOARD 7.5X6 YLW CONV (MISCELLANEOUS) ×12 IMPLANT
PATTIES SURGICAL .25X.25 (GAUZE/BANDAGES/DRESSINGS) IMPLANT
PATTIES SURGICAL .5 X.5 (GAUZE/BANDAGES/DRESSINGS) IMPLANT
PATTIES SURGICAL .5 X3 (DISPOSABLE) ×8 IMPLANT
PATTIES SURGICAL 1/4 X 3 (GAUZE/BANDAGES/DRESSINGS) IMPLANT
PATTIES SURGICAL 1X1 (DISPOSABLE) IMPLANT
PIN MAYFIELD SKULL DISP (PIN) ×4 IMPLANT
PLATE CRANIAL 12 2H RIGID UNI (Plate) ×8 IMPLANT
SCREW UNIII AXS SD 1.5X4 (Screw) ×80 IMPLANT
SEALANT ADHERUS EXTEND TIP (MISCELLANEOUS) ×4 IMPLANT
SET CARTRIDGE AND TUBING (SET/KITS/TRAYS/PACK) ×4 IMPLANT
SET TUBING IRRIGATION DISP (TUBING) ×8 IMPLANT
SPONGE NEURO XRAY DETECT 1X3 (DISPOSABLE) IMPLANT
SPONGE SURGIFOAM ABS GEL 100 (HEMOSTASIS) ×4 IMPLANT
STAPLER VISISTAT 35W (STAPLE) ×4 IMPLANT
STRIP CLOSURE SKIN 1/2X4 (GAUZE/BANDAGES/DRESSINGS) IMPLANT
SUT ETHILON 3 0 FSL (SUTURE) IMPLANT
SUT ETHILON 3 0 PS 1 (SUTURE) IMPLANT
SUT MNCRL AB 3-0 PS2 18 (SUTURE) IMPLANT
SUT MNCRL AB 4-0 PS2 18 (SUTURE) ×4 IMPLANT
SUT NURALON 4 0 TR CR/8 (SUTURE) ×12 IMPLANT
SUT SILK 0 TIES 10X30 (SUTURE) IMPLANT
SUT VIC AB 2-0 CP2 18 (SUTURE) ×8 IMPLANT
SYSTEM CSF EXTERNAL DRAINAGE (MISCELLANEOUS) ×4 IMPLANT
TIP SHEAR CVD EXTENDED 36KH (INSTRUMENTS) ×4 IMPLANT
TOWEL GREEN STERILE (TOWEL DISPOSABLE) ×8 IMPLANT
TOWEL GREEN STERILE FF (TOWEL DISPOSABLE) ×8 IMPLANT
TRAY FOLEY MTR SLVR 16FR STAT (SET/KITS/TRAYS/PACK) ×4 IMPLANT
TUBE CONNECTING 12'X1/4 (SUCTIONS) ×1
TUBE CONNECTING 12X1/4 (SUCTIONS) ×3 IMPLANT
UNDERPAD 30X36 HEAVY ABSORB (UNDERPADS AND DIAPERS) ×4 IMPLANT
WATER STERILE IRR 1000ML POUR (IV SOLUTION) ×8 IMPLANT
WRENCH TORQUE 36KHZ (INSTRUMENTS) ×4 IMPLANT

## 2020-11-01 NOTE — OR Nursing (Signed)
1410 attempted foley catheter inserted per order of Dr Marcello Moores however not able to inflate balloon due to no urine present patient is a renal dialysis patient and Dr Marcello Moores aware of foley not being inserted

## 2020-11-01 NOTE — Op Note (Signed)
Procedure(s): CRANIOTOMY FOR TUMOR EXCISION PLACEMENT OF LUMBAR DRAIN Procedure Note  Ruth Gutierrez female 55 y.o. 11/01/2020  Procedure(s) and Anesthesia Type:    * CRANIOTOMY FOR TUMOR EXCISION - General    * PLACEMENT OF LUMBAR DRAIN - General  Surgeon(s) and Role:    Marcello Moores, Dorcas Carrow, MD - Primary    * Dawley, Theodoro Doing, DO - Assisting   Indications: This is a 55 year old woman with end-stage renal disease status post failed transplant 2010 and severe hypertension who presented for altered mental status and encephalopathy.  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  Due to the severe amount of swelling, it was clearly a life-threatening tumor.  She had preoperative anosmia as well with mild abulia.  Our neurosurgical team had a long discussion with the patient and family regarding treatment options.  Although she certainly was at high risk of perioperative complications with her underlying medical comorbidities, surgery was the only effective treatment for her tumor with this amount of swelling.  Risks, benefits, alternatives, expected convalescence were discussed with her and her mother.  Risks discussed included, but were not limited to, bleeding, pain, infection, seizure, scar, stroke, recurrence, spinal fluid leak, pneumocephalus, neurologic deficit, coma, and death.   Surgeon: [name]   Assistants: Elwin Sleight, DO.  No qualified trainees were available to assist with the procedure.  Dr. Richmond Campbell assisted with the tumor resection, brain retraction, opening and closing.  Anesthesia: General endotracheal anesthesia   Procedure Detail  1. Bifrontal craniotomy including cranialization of frontal sinus for resection of skull base mass 2. Placement of lumbar drain 3. Use of microscope for intraoperative microdissection   The patient was brought to the operating room.  General anesthesia was  induced and patient was intubated by the anesthesia service.  After appropriate lines and monitors were placed, patient was positioned in the lateral decubitus position and the lower back was prepped and draped in sterile fashion.  A 2 he needle was used to perform a lumbar puncture and a lumbar drain catheter was passed into the intrathecal space and connected to tubing.  The drain was then secured.  Patient was then positioned supine.  The head was placed in [place] head holder and affixed the bed.  Scalp was clipped, preprepped with alcohol and prepped and draped in sterile fashion.  A bicoronal incision was planned.  1% lidocaine with epinephrine was injected in the skin.  Preoperative antibiotics and dexamethasone were given.  A timeout was performed.  Incision was made with a 10 blade and the galea was cut sharply.  The subgaleal space was dissected posteriorly and a large pericranial flap was obtained and flapped forward to the orbital rim.  The superior aspect of the temporalis muscle was reflected downward to expose the keyhole bilaterally.  High-speed drill was used to create bur holes at the key holes and at the midline over the sinus.  The dura was dissected from the inner table skull.  Craniotome was used to form the bifrontal craniotomy including going across her large frontal sinuses.  Epidural hemostasis was obtained.  The frontal sinus was then cranialized with removal of the mucosal and the posterior table.  The Bovie was used to remove any  mucosal rests in the bone.  The frontonasal duct was plugged with bone wax and fibrillar.  Instruments used for cranialization were then discarded.  The brain was very tense and the lumbar  drain was used to help relax the brain.  The dura was opened towards the sinus and the brain was dissected away from the falx.  2-0 Vicryl stitches were placed to tie off the sinus and the sinus was then cut along with the falx until it was fully divided.  The microscope was  then introduced into the field to allow for intraoperative microdissection.  The brain was still fairly swollen and but the olfactory groove tumor was able to be slowly dissected from the surrounding brain initially.  The tumor was then devascularized by coagulating cutting feeders from the cribriform plate and olfactory groove as well as ethmoidal vessels.  The tumor was then developed internally.  It was a mix of heavily calcified material with areas of softer stroma.  Following debulking, additional circumferential dissection of the tumor from the frontal lobe was performed.  The tumor was very adherent to the brain and especially in the posterior portion where there was no arachnoid or pial plane evident.  ACA feeders were going directly to the tumor and were coagulated and cut.  One of the ACAs was very stuck to the capsule and partially encased in the tumor.  It was skeletonized and liberated from the tumor and protected.  After full circumferential dissection was performed, the tumor was removed in total.  The anterior skull base was then coagulated with bipolar electrocautery.  Meticulous hemostasis was obtained.  The dura was then closed with Nurolon stitches and reinforced with Adherus spray.  DuraGen plus was then placed over the anterior skull base.  The pericranial flap was then flapped over the sinus and stitched to the underside of the frontal dura.  The redundant paracranial flap was then flapped back over to the frontal polar dura.  The bone flap was then replaced with Stryker cranial plating system.  A medium Hemovac drain was placed and secured with a stitch.  The temporalis muscle was then reapproximated.  The galea was then closed with 2-0 Vicryl stitches in buried interrupted fashion.  The skin was closed with staples and a sterile dressing was then placed.  Patient was then removed from [place] head holder flipped lateral and the lumbar drain was removed.  She was then extubated by the  anesthesia service.  All counts were correct at the end of surgery.  No complications were noted.  With how adherent the tumor was to the pial vessels and brain, the dissection of the tumor was very involved and required considerably more effort.   Findings: Successful tumor resection  Estimated Blood Loss:  250 ml         Drains: HEMOVAC         Blood Given:  PRBCs          Specimens: anterior skull base mass         Implants: Stryker cranial plating        Complications:  * No complications entered in OR log *         Disposition: PACU - hemodynamically stable.         Condition: stable

## 2020-11-01 NOTE — Anesthesia Procedure Notes (Signed)
Arterial Line Insertion Start/End3/31/2022 1:30 PM Performed by: Janace Litten, CRNA, CRNA  Patient location: Pre-op. Preanesthetic checklist: patient identified, IV checked, risks and benefits discussed, surgical consent, monitors and equipment checked and pre-op evaluation Lidocaine 1% used for infiltration Right, radial was placed Catheter size: 20 G Hand hygiene performed  and maximum sterile barriers used   Attempts: 1 Procedure performed without using ultrasound guided technique. Following insertion, dressing applied and Biopatch. Post procedure assessment: normal  Patient tolerated the procedure well with no immediate complications.

## 2020-11-01 NOTE — Anesthesia Preprocedure Evaluation (Addendum)
Anesthesia Evaluation  Patient identified by MRN, date of birth, ID band Patient awake    Reviewed: Allergy & Precautions, NPO status , Patient's Chart, lab work & pertinent test results  History of Anesthesia Complications Negative for: history of anesthetic complications  Airway Mallampati: II  TM Distance: >3 FB Neck ROM: Full    Dental  (+) Dental Advisory Given   Pulmonary Current Smoker and Patient abstained from smoking.,    Pulmonary exam normal        Cardiovascular hypertension, Pt. on medications Normal cardiovascular exam   '22 TTE - EF 60 to 65%. Moderate concentric left ventricular hypertrophy. Grade I diastolic dysfunction  (impaired relaxation). There is mildly elevated pulmonary artery systolic pressure. Left atrial size was mildly dilated. Trivial mitral valve regurgitation. Tricuspid valve regurgitation is moderate.    Neuro/Psych  Headaches,  Meningioma  negative psych ROS   GI/Hepatic Neg liver ROS, GERD  Medicated and Controlled,  Endo/Other  diabetes, Type 2 K 5.2 Ca 7.5   Renal/GU Dialysis and ESRFRenal disease (s/p failed renal transplant)     Musculoskeletal  (+) Arthritis ,   Abdominal   Peds  Hematology  (+) anemia ,  Thrombocytopenia, Plt 141k INR 1.4    Anesthesia Other Findings Covid test negative   Reproductive/Obstetrics                            Anesthesia Physical Anesthesia Plan  ASA: III  Anesthesia Plan: General   Post-op Pain Management:    Induction: Intravenous  PONV Risk Score and Plan: 3 and Treatment may vary due to age or medical condition, Ondansetron, Dexamethasone and Midazolam  Airway Management Planned: Oral ETT  Additional Equipment: Arterial line  Intra-op Plan:   Post-operative Plan: Possible Post-op intubation/ventilation  Informed Consent: I have reviewed the patients History and Physical, chart, labs and  discussed the procedure including the risks, benefits and alternatives for the proposed anesthesia with the patient or authorized representative who has indicated his/her understanding and acceptance.     Dental advisory given  Plan Discussed with: CRNA and Anesthesiologist  Anesthesia Plan Comments:        Anesthesia Quick Evaluation

## 2020-11-01 NOTE — Progress Notes (Signed)
Subjective: Patient reports no changes  Objective: Vital signs in last 24 hours: Temp:  [98.1 F (36.7 C)-99.2 F (37.3 C)] 98.9 F (37.2 C) (03/31 0802) Pulse Rate:  [79-94] 81 (03/31 0802) Resp:  [17-22] 18 (03/31 0802) BP: (136-185)/(82-119) 172/95 (03/31 0802) SpO2:  [96 %-100 %] 100 % (03/31 0802) Weight:  [75.5 kg-77.7 kg] 75.5 kg (03/30 1710)  Intake/Output from previous day: 03/30 0701 - 03/31 0700 In: -  Out: 2000  Intake/Output this shift: No intake/output data recorded.  NAD Anosmic FC x 4, speech fluent, mild cognitive deficits and inattention  Lab Results: Recent Labs    10/31/20 1839 11/01/20 0945  WBC 11.5* 10.1  HGB 8.4* 8.3*  HCT 25.9* 26.4*  PLT 155 141*   BMET Recent Labs    10/31/20 1839 11/01/20 0344  NA 137 136  K 4.5 5.2*  CL 97* 97*  CO2 29 28  GLUCOSE 146* 139*  BUN 28* 44*  CREATININE 5.06* 6.69*  CALCIUM 8.5* 7.5*    Studies/Results: DG Chest 1 View  Result Date: 10/31/2020 CLINICAL DATA:  Cough EXAM: CHEST  1 VIEW COMPARISON:  10/29/2020 FINDINGS: The lungs are symmetrically well expanded. Previously noted mild pulmonary edema has improved in the interval, though central pulmonary vascular engorgement persists. No pneumothorax or pleural effusion. Mild cardiomegaly is present. Vascular stent noted within the left axilla. No acute bone abnormality. IMPRESSION: Improving pulmonary edema. Persistent central pulmonary vascular congestion. Electronically Signed   By: Fidela Salisbury MD   On: 10/31/2020 07:16    Assessment/Plan: 55 yo F with large olfactory grove meningioma - plan on resection today - risks, benefits, alternatives and expected convalescence discussed with her and mother. She wished to proceed    LOS: 3 days     [name] 11/01/2020, 1:35 PM

## 2020-11-01 NOTE — Progress Notes (Signed)
HD#3 Subjective:  Overnight Events: No acute overnight events reported    Patient had mother in room with her for bedside rounds this morning. She reports she feels ready for surgery and is trying not to think too much about it. States breathing has been much improved and cough is getting better. Still endorses slight productive cough with clear sputum production.   Denies HA, lightheadedness, dizziness, fever, chills, SOB, abdominal pain, change in bladder or bowel.   Objective:  Vital signs in last 24 hours: Vitals:   10/31/20 1957 11/01/20 0037 11/01/20 0340 11/01/20 0802  BP: (!) 171/94 (!) 159/90 (!) 136/119 (!) 172/95  Pulse: 87 90 79 81  Resp: 20 18 18 18   Temp: 99.1 F (37.3 C) 98.5 F (36.9 C) 98.4 F (36.9 C) 98.9 F (37.2 C)  TempSrc: Oral Oral  Oral  SpO2: 100% 96% 100% 100%  Weight:       Supplemental O2: Room Air SpO2: 100 % O2 Flow Rate (L/min): 2 L/min   Physical Exam:  Constitutional: well-appearing female sitting in bed, in no acute distress HENT: normocephalic atraumatic, mucous membranes moist Eyes: conjunctiva non-erythematous, sclera anicteric  Neck: supple Cardiovascular: regular rate and rhythm, no m/r/g Pulmonary/Chest: normal work of breathing on room air, lungs clear to auscultation bilaterally Abdominal: soft, non-tender, non-distended MSK: normal bulk and tone Neurological: alert & oriented x 3 Skin: warm and dry Psych: Appropriate   Filed Weights   10/31/20 0021 10/31/20 1426 10/31/20 1710  Weight: 76.7 kg 77.7 kg 75.5 kg     Intake/Output Summary (Last 24 hours) at 11/01/2020 1358 Last data filed at 10/31/2020 1710 Gross per 24 hour  Intake --  Output 2000 ml  Net -2000 ml   Net IO Since Admission: -3,400 mL [11/01/20 1358]  Pertinent Labs: CBC Latest Ref Rng & Units 11/01/2020 10/31/2020 10/31/2020  WBC 4.0 - 10.5 K/uL 10.1 11.5(H) 8.2  Hemoglobin 12.0 - 15.0 g/dL 8.3(L) 8.4(L) 7.8(L)  Hematocrit 36.0 - 46.0 % 26.4(L)  25.9(L) 23.3(L)  Platelets 150 - 400 K/uL 141(L) 155 107(L)    CMP Latest Ref Rng & Units 11/01/2020 10/31/2020 10/31/2020  Glucose 70 - 99 mg/dL 139(H) 146(H) 126(H)  BUN 6 - 20 mg/dL 44(H) 28(H) 43(H)  Creatinine 0.44 - 1.00 mg/dL 6.69(H) 5.06(H) 7.72(H)  Sodium 135 - 145 mmol/L 136 137 135  Potassium 3.5 - 5.1 mmol/L 5.2(H) 4.5 4.7  Chloride 98 - 111 mmol/L 97(L) 97(L) 96(L)  CO2 22 - 32 mmol/L 28 29 26   Calcium 8.9 - 10.3 mg/dL 7.5(L) 8.5(L) 9.7  Total Protein 6.5 - 8.1 g/dL - - -  Total Bilirubin 0.3 - 1.2 mg/dL - - -  Alkaline Phos 38 - 126 U/L - - -  AST 15 - 41 U/L - - -  ALT 0 - 44 U/L - - -    Imaging: No results found.  Assessment/Plan:   Active Problems:   Encephalopathy   Patient Summary: Ruth Gutierrez a 55 y.o.with a pertinent PMH of ESRD on dialysis (MWF) s/p failed kidney transplant (2010),HTNwho presented with shortness of breath, AMSand admitted for meningioma with significant vasogenic anemia.  1. Intercranial mass, HA: Presented with increased somnolence, worsening frontal HA occurring every other day, denied ataxia, vision changes.MRI confirms meningioma with severe vasogenic edema. -Appreciate neurosurgery consult and recommendations  -Dexamethasone 4mg , Q8H IV -Per neurosurgery, surgery scheduled for today.  2.Shortness of breath: Presented with worsening ShOB over the last couple of days, productive cough, subjective fevers, ~  20 pack year smoking history, no reported chronic lung disease, lactic acidresolved to 1.1.BNP 2,428, on admission with negative respiratory panel. Lungs sounds have been improving and today they were clear to auscultation. Patient is stating 98-100% on room air. CXR yesterday showed improvement of pulmonary edema. Echo 10/30/2020,  showed EF of 60-65%, moderate concentric left ventricular hypertrophy, Grade 1 diastolic dysfunction. Due to reasuring CXR, abx were discontinued yesterday.  -Follow blood cultures, no  growth after 2 days  -Continuous pulse ox monitoring, O2 to keep O2 stat >92%  3. ESRD on dialysis (MWF) Received gently hemodialysis 3/29 and 3/30.  -Appreciate nephrology consult and recommendations  4.Severe symptomatic hypertensionBP 244/101 on admissionnow 407-680S systolic. Takes amlodipine 10 mg at home, states is inconsistent with medication adherence.  -Continue to hold amlodipine 10 mg  -Permissive HTN due to intercranial mass  -Labetalol 5 mg PRN for BP over 180/80 -Consider increasing home meds prior to discharge   5. Anemia of chronic disease Hgb8.3, increased from 7.8 yesterday, could be dilutional or from previous bleeding from fistula site as it is still decreased from her Hgb on admission. Ferritin 1,080 with TIBC 196, and iron of 94. This is consistent with anemia of chronic disease picture. Reticulocyte count 3.8, smear was unremarkable.  -Recheck CBC this afternoon -Continue mircera 75 mcg q 2 weeks  -Continue iron 50 mg weekly   6. Secondary hyperparathyroidism, hyperphosphatemia Phosphorus elevated at 6.7 -Follow phosphorus  -Sensipar 180 mg and hecerol 5 mcg w/dialysis   Diet: NPO for surgery IVF: None,None VTE: Heparin Code: Full PT/OT recs: None, none. TOC: Patient will likely require PT/OT post surgery Family Update: Mother updated in room    Dispo: Anticipated discharge to Home in 3 days pending recovery from neurosurgery.   [place] Student  Pager [phone] Please contact the on call pager after 5 pm and on weekends at [phone].

## 2020-11-01 NOTE — Progress Notes (Signed)
Pharmacy - Vancomycin x 48 hours / while drain in place  S/p neurosurgery  ESRD -- > HD MWF  Received dose of Vancomycin 1 gram iv x 1 today at 1 pm  Plan: Vancomycin 750 mg iv x 1 dose after HD tomorrow Follow up if further doses needed.  Thank you. Anette Guarneri, PharmD

## 2020-11-01 NOTE — Progress Notes (Signed)
[facility] ROUNDING NOTE   Subjective:   History: 55 year old lady end-stage renal disease status post failed transplant 2010 history of hypertension altered mental status admitted for meningioma with significant vasogenic edema.  Neurosurgical intervention planned.  She received a dialysis treatment in screens per kidney center on a Monday Wednesday Friday schedule.  Blood pressure 136/90 pulse 82 temperature 98.4 O2 sats 90% room air  Sodium 136 potassium 5.2 chloride 97 CO2 28 BUN 44 creatinine 6.69 glucose 139 calcium 7.5 O2 sats 48% hemoglobin 8.4  Decadron every 8 hours, Hectorol 5 mcg Monday Wednesday Friday Aranesp 100 mcg weekly   Objective:  Vital signs in last 24 hours:  Temp:  [98.1 F (36.7 C)-99.2 F (37.3 C)] 98.4 F (36.9 C) (03/31 0340) Pulse Rate:  [79-94] 79 (03/31 0340) Resp:  [17-22] 18 (03/31 0340) BP: (136-191)/(82-119) 136/119 (03/31 0340) SpO2:  [96 %-100 %] 100 % (03/31 0340) Weight:  [75.5 kg-77.7 kg] 75.5 kg (03/30 1710)  Weight change: -1.7 kg Filed Weights   10/31/20 0021 10/31/20 1426 10/31/20 1710  Weight: 76.7 kg 77.7 kg 75.5 kg    Intake/Output: I/O last 3 completed shifts: In: -  Out: 4000 [Other:4000]   Intake/Output this shift:  No intake/output data recorded.  Constitutional: well-appearing, no acute distress ENMT: ears and nose without scars or lesions, MMM CV: normal rate, no edema Respiratory: clear to auscultation, normal work of breathing Gastrointestinal: soft, non-tender, no palpable masses or hernias Skin: no visible lesions or rashes Psych: Awake, alert, answers most questions appropriately   Basic Metabolic Panel: Recent Labs  Lab 10/29/20 0944 10/29/20 1031 10/29/20 1346 10/29/20 1524 10/30/20 0302 10/31/20 0424 10/31/20 0932 10/31/20 1839 11/01/20 0344  NA 137 136  --   --  138 135  --  137 136  K 4.1 4.0  --   --  4.9 4.7  --  4.5 5.2*  CL 95*  --   --   --  94* 96*  --  97* 97*  CO2 27   --   --   --  29 26  --  29 28  GLUCOSE 100*  --   --   --  184* 126*  --  146* 139*  BUN 42*  --   --   --  62* 43*  --  28* 44*  CREATININE 8.27*  --   --   --  10.63* 7.72*  --  5.06* 6.69*  CALCIUM 9.4  --   --   --  8.7* 9.7  --  8.5* 7.5*  MG  --   --   --  2.7*  --   --  2.5*  --   --   PHOS  --   --  6.7*  --  7.6* 7.2*  --   --   --     Liver Function Tests: Recent Labs  Lab 10/29/20 0944 10/30/20 0302 10/31/20 0424  AST 30  --   --   ALT 20  --   --   ALKPHOS 256*  --   --   BILITOT 1.2  --   --   PROT 6.7  --   --   ALBUMIN 3.3* 2.9* 3.0*   No results for input(s): LIPASE, AMYLASE in the last 168 hours. Recent Labs  Lab 10/29/20 1155  AMMONIA 16    CBC: Recent Labs  Lab 10/29/20 0944 10/29/20 1031 10/30/20 0302 10/30/20 1704 10/31/20 0424 10/31/20 1839  WBC 10.4  --  6.3 8.5 8.2 11.5*  NEUTROABS 8.9*  --   --   --  7.1  --   HGB 9.2* 9.5* 7.8* 7.7* 7.8* 8.4*  HCT 28.8* 28.0* 23.3* 23.0* 23.3* 25.9*  MCV 96.6  --  95.5 94.3 94.0 95.2  PLT 137*  --  100* 103* 107* 155    Cardiac Enzymes: No results for input(s): CKTOTAL, CKMB, CKMBINDEX, TROPONINI in the last 168 hours.  BNP: Invalid input(s): POCBNP  CBG: No results for input(s): GLUCAP in the last 168 hours.  Microbiology: Results for orders placed or performed during the hospital encounter of 10/29/20  Resp Panel by RT-PCR (Flu A&B, Covid) Nasopharyngeal Swab     Status: None   Collection Time: 10/29/20  9:45 AM   Specimen: Nasopharyngeal Swab; Nasopharyngeal(NP) swabs in vial transport medium  Result Value Ref Range Status   SARS Coronavirus 2 by RT PCR NEGATIVE NEGATIVE Final    Comment: (NOTE) SARS-CoV-2 target nucleic acids are NOT DETECTED.  The SARS-CoV-2 RNA is generally detectable in upper respiratory specimens during the acute phase of infection. The lowest concentration of SARS-CoV-2 viral copies this assay can detect is 138 copies/mL. A negative result does not preclude  SARS-Cov-2 infection and should not be used as the sole basis for treatment or other patient management decisions. A negative result may occur with  improper specimen collection/handling, submission of specimen other than nasopharyngeal swab, presence of viral mutation(s) within the areas targeted by this assay, and inadequate number of viral copies(<138 copies/mL). A negative result must be combined with clinical observations, patient history, and epidemiological information. The expected result is Negative.  Fact Sheet for Patients:  EntrepreneurPulse.com.au  Fact Sheet for Healthcare Providers:  IncredibleEmployment.be  This test is no t yet approved or cleared by the [place] FDA and  has been authorized for detection and/or diagnosis of SARS-CoV-2 by FDA under an Emergency Use Authorization (EUA). This EUA will remain  in effect (meaning this test can be used) for the duration of the COVID-19 declaration under Section 564(b)(1) of the Act, 21 U.S.C.section 360bbb-3(b)(1), unless the authorization is terminated  or revoked sooner.       Influenza A by PCR NEGATIVE NEGATIVE Final   Influenza B by PCR NEGATIVE NEGATIVE Final    Comment: (NOTE) The Xpert Xpress SARS-CoV-2/FLU/RSV plus assay is intended as an aid in the diagnosis of influenza from Nasopharyngeal swab specimens and should not be used as a sole basis for treatment. Nasal washings and aspirates are unacceptable for Xpert Xpress SARS-CoV-2/FLU/RSV testing.  Fact Sheet for Patients: EntrepreneurPulse.com.au  Fact Sheet for Healthcare Providers: IncredibleEmployment.be  This test is not yet approved or cleared by the [place] FDA and has been authorized for detection and/or diagnosis of SARS-CoV-2 by FDA under an Emergency Use Authorization (EUA). This EUA will remain in effect (meaning this test can be used) for the duration of  the COVID-19 declaration under Section 564(b)(1) of the Act, 21 U.S.C. section 360bbb-3(b)(1), unless the authorization is terminated or revoked.  Performed at [facility], [place] [address] [place], [place] [address]   Blood culture (routine x 2)     Status: None (Preliminary result)   Collection Time: 10/29/20 11:07 AM   Specimen: BLOOD  Result Value Ref Range Status   Specimen Description BLOOD SITE NOT SPECIFIED  Final   Special Requests   Final    BOTTLES DRAWN AEROBIC AND ANAEROBIC Blood Culture adequate volume   Culture   Final  NO GROWTH 2 DAYS Performed at [facility], [place] [address], [place], [place] [address]    Report Status PENDING  Incomplete  Blood culture (routine x 2)     Status: None (Preliminary result)   Collection Time: 10/29/20 11:12 AM   Specimen: BLOOD RIGHT ARM  Result Value Ref Range Status   Specimen Description BLOOD RIGHT ARM  Final   Special Requests   Final    BOTTLES DRAWN AEROBIC AND ANAEROBIC Blood Culture results may not be optimal due to an inadequate volume of blood received in culture bottles   Culture   Final    NO GROWTH 2 DAYS Performed at [facility], [place] [address] [place], [place] [address]    Report Status PENDING  Incomplete  Respiratory (~20 pathogens) panel by PCR     Status: None   Collection Time: 10/29/20  3:22 PM   Specimen: Nasopharyngeal Swab; Respiratory  Result Value Ref Range Status   Adenovirus NOT DETECTED NOT DETECTED Final   Coronavirus 229E NOT DETECTED NOT DETECTED Final    Comment: (NOTE) The Coronavirus on the Respiratory Panel, DOES NOT test for the novel  Coronavirus (2019 nCoV)    Coronavirus HKU1 NOT DETECTED NOT DETECTED Final   Coronavirus NL63 NOT DETECTED NOT DETECTED Final   Coronavirus OC43 NOT DETECTED NOT DETECTED Final   Metapneumovirus NOT DETECTED NOT DETECTED Final   Rhinovirus / Enterovirus NOT DETECTED NOT DETECTED Final   Influenza A NOT DETECTED NOT DETECTED Final    Influenza B NOT DETECTED NOT DETECTED Final   Parainfluenza Virus 1 NOT DETECTED NOT DETECTED Final   Parainfluenza Virus 2 NOT DETECTED NOT DETECTED Final   Parainfluenza Virus 3 NOT DETECTED NOT DETECTED Final   Parainfluenza Virus 4 NOT DETECTED NOT DETECTED Final   Respiratory Syncytial Virus NOT DETECTED NOT DETECTED Final   Bordetella pertussis NOT DETECTED NOT DETECTED Final   Bordetella Parapertussis NOT DETECTED NOT DETECTED Final   Chlamydophila pneumoniae NOT DETECTED NOT DETECTED Final   Mycoplasma pneumoniae NOT DETECTED NOT DETECTED Final    Comment: Performed at [facility], [place]. [address], [place], [place] [address]  MRSA PCR Screening     Status: Abnormal   Collection Time: 10/30/20  7:59 PM   Specimen: Nasal Mucosa; Nasopharyngeal  Result Value Ref Range Status   MRSA by PCR (A) NEGATIVE Final    INVALID, UNABLE TO DETERMINE THE PRESENCE OF TARGET DUE TO SPECIMEN INTEGRITY. RECOLLECTION REQUESTED.    Comment: RESULT CALLED TO, READ BACK BY AND VERIFIED WITHVeva Holes RN 11/01/20 0024 JDW Performed at [facility], [address] [place], [place] [address]   Surgical pcr screen     Status: None   Collection Time: 11/01/20 12:43 AM   Specimen: Nasal Mucosa; Nasal Swab  Result Value Ref Range Status   MRSA, PCR NEGATIVE NEGATIVE Final   Staphylococcus aureus NEGATIVE NEGATIVE Final    Comment: (NOTE) The Xpert SA Assay (FDA approved for NASAL specimens in patients 56 years of age and older), is one component of a comprehensive surveillance program. It is not intended to diagnose infection nor to guide or monitor treatment. Performed at [facility], [place] [address] [place], [place] [address]     Coagulation Studies: Recent Labs    10/31/20 1839  LABPROT 16.4*  INR 1.4*    Urinalysis: No results for input(s): COLORURINE, LABSPEC, PHURINE, GLUCOSEU, HGBUR, BILIRUBINUR, KETONESUR, PROTEINUR, UROBILINOGEN, NITRITE, LEUKOCYTESUR in  the  last 72 hours.  Invalid input(s): APPERANCEUR    Imaging: DG Chest 1 View  Result Date: 10/31/2020 CLINICAL DATA:  Cough EXAM: CHEST  1 VIEW COMPARISON:  10/29/2020 FINDINGS: The lungs are symmetrically well expanded. Previously noted mild pulmonary edema has improved in the interval, though central pulmonary vascular engorgement persists. No pneumothorax or pleural effusion. Mild cardiomegaly is present. Vascular stent noted within the left axilla. No acute bone abnormality. IMPRESSION: Improving pulmonary edema. Persistent central pulmonary vascular congestion. Electronically Signed   By: Fidela Salisbury MD   On: 10/31/2020 07:16   ECHOCARDIOGRAM COMPLETE  Result Date: 10/30/2020    ECHOCARDIOGRAM REPORT   Patient Name:   Ruth Gutierrez Date of Exam: 10/30/2020 Medical Rec #:  [id number]            Height:       67.0 in Accession #:    [id number]           Weight:       164.8 lb Date of Birth:  1966-01-25            BSA:          1.863 m Patient Age:    74 years             BP:           167/90 mmHg Patient Gender: F                    HR:           95 bpm. Exam Location:  Inpatient Procedure: 2D Echo, Cardiac Doppler and Color Doppler Indications:    CHF  History:        Patient has prior history of Echocardiogram examinations, most                 recent 02/11/2018. Signs/Symptoms:Shortness of Breath; Risk                 Factors:Hypertension.  Sonographer:    [place] Referring Phys: [id number] Brave  1. Left ventricular ejection fraction, by estimation, is 60 to 65%. The left ventricle has normal function. The left ventricle has no regional wall motion abnormalities. There is moderate concentric left ventricular hypertrophy. Left ventricular diastolic parameters are consistent with Grade I diastolic dysfunction (impaired relaxation).  2. Right ventricular systolic function is normal. The right ventricular size is normal. There is mildly elevated pulmonary artery  systolic pressure.  3. Left atrial size was mildly dilated.  4. The mitral valve is normal in structure. Trivial mitral valve regurgitation. No evidence of mitral stenosis.  5. Tricuspid valve regurgitation is moderate.  6. The aortic valve is tricuspid. Aortic valve regurgitation is not visualized. No aortic stenosis is present.  7. The inferior vena cava is normal in size with greater than 50% respiratory variability, suggesting right atrial pressure of 3 mmHg. FINDINGS  Left Ventricle: Left ventricular ejection fraction, by estimation, is 60 to 65%. The left ventricle has normal function. The left ventricle has no regional wall motion abnormalities. The left ventricular internal cavity size was normal in size. There is  moderate concentric left ventricular hypertrophy. Left ventricular diastolic parameters are consistent with Grade I diastolic dysfunction (impaired relaxation). Right Ventricle: The right ventricular size is normal. No increase in right ventricular wall thickness. Right ventricular systolic function is normal. There is mildly elevated pulmonary artery systolic pressure. The tricuspid regurgitant velocity is 3.07  m/s, and with  an assumed right atrial pressure of 3 mmHg, the estimated right ventricular systolic pressure is 69.4 mmHg. Left Atrium: Left atrial size was mildly dilated. Right Atrium: Right atrial size was normal in size. Pericardium: There is no evidence of pericardial effusion. Mitral Valve: The mitral valve is normal in structure. Mild mitral annular calcification. Trivial mitral valve regurgitation. No evidence of mitral valve stenosis. MV peak gradient, 9.9 mmHg. The mean mitral valve gradient is 5.0 mmHg. Tricuspid Valve: The tricuspid valve is normal in structure. Tricuspid valve regurgitation is moderate . No evidence of tricuspid stenosis. Aortic Valve: The aortic valve is tricuspid. Aortic valve regurgitation is not visualized. No aortic stenosis is present. Aortic valve mean  gradient measures 6.0 mmHg. Aortic valve peak gradient measures 9.7 mmHg. Aortic valve area, by VTI measures 3.01 cm. Pulmonic Valve: The pulmonic valve was normal in structure. Pulmonic valve regurgitation is trivial. No evidence of pulmonic stenosis. Aorta: The aortic root is normal in size and structure. Venous: The inferior vena cava is normal in size with greater than 50% respiratory variability, suggesting right atrial pressure of 3 mmHg. IAS/Shunts: No atrial level shunt detected by color flow Doppler.  LEFT VENTRICLE PLAX 2D LVIDd:         5.10 cm     Diastology LVIDs:         2.90 cm     LV e' medial:    7.40 cm/s LV PW:         1.30 cm     LV E/e' medial:  17.7 LV IVS:        1.40 cm     LV e' lateral:   7.40 cm/s LVOT diam:     2.20 cm     LV E/e' lateral: 17.7 LV SV:         88 LV SV Index:   47 LVOT Area:     3.80 cm  LV Volumes (MOD) LV vol d, MOD A2C: 82.1 ml LV vol d, MOD A4C: 69.5 ml LV vol s, MOD A2C: 25.4 ml LV vol s, MOD A4C: 35.6 ml LV SV MOD A2C:     56.7 ml LV SV MOD A4C:     69.5 ml LV SV MOD BP:      47.4 ml RIGHT VENTRICLE TAPSE (M-mode): 2.6 cm LEFT ATRIUM             Index       RIGHT ATRIUM           Index LA Vol (A2C):   49.7 ml 26.68 ml/m RA Area:     13.50 cm LA Vol (A4C):   54.7 ml 29.37 ml/m RA Volume:   28.90 ml  15.52 ml/m LA Biplane Vol: 57.5 ml 30.87 ml/m  AORTIC VALVE                    PULMONIC VALVE AV Area (Vmax):    3.53 cm     PV Vmax:       1.07 m/s AV Area (Vmean):   3.29 cm     PV Vmean:      80.700 cm/s AV Area (VTI):     3.01 cm     PV VTI:        0.223 m AV Vmax:           156.00 cm/s  PV Peak grad:  4.6 mmHg AV Vmean:          112.000 cm/s PV Mean grad:  3.0 mmHg AV VTI:            0.292 m AV Peak Grad:      9.7 mmHg AV Mean Grad:      6.0 mmHg LVOT Vmax:         145.00 cm/s LVOT Vmean:        96.900 cm/s LVOT VTI:          0.231 m LVOT/AV VTI ratio: 0.79  AORTA Ao Root diam: 3.00 cm Ao Asc diam:  2.40 cm MITRAL VALVE                 TRICUSPID VALVE MV  Area (PHT): 5.02 cm      TR Peak grad:   37.7 mmHg MV Area VTI:   2.67 cm      TR Vmax:        307.00 cm/s MV Peak grad:  9.9 mmHg MV Mean grad:  5.0 mmHg      SHUNTS MV Vmax:       1.57 m/s      Systemic VTI:  0.23 m MV Vmean:      110.0 cm/s    Systemic Diam: 2.20 cm MV Decel Time: 151 msec MR Peak grad:    115.8 mmHg MR Mean grad:    90.0 mmHg MR Vmax:         538.00 cm/s MR Vmean:        463.0 cm/s MR PISA:         0.25 cm MR PISA Eff ROA: 1 mm MR PISA Radius:  0.20 cm MV E velocity: 131.00 cm/s MV A velocity: 159.00 cm/s MV E/A ratio:  0.82 Skeet Latch MD Electronically signed by Skeet Latch MD Signature Date/Time: 10/30/2020/3:14:15 PM    Final      Medications:   . vancomycin     . sodium chloride   Intravenous Once  . Chlorhexidine Gluconate Cloth  6 each Topical Q0600  . cinacalcet  180 mg Oral Q M,W,F  . [START ON 11/05/2020] darbepoetin (ARANESP) injection - DIALYSIS  100 mcg Intravenous Q Mon-HD  . dexamethasone (DECADRON) injection  4 mg Intravenous Q8H  . doxercalciferol  5 mcg Oral Once per day on Mon Wed Fri  . sucroferric oxyhydroxide  1,000 mg Oral TID WC   acetaminophen **OR** acetaminophen, labetalol  Assessment/ Plan:  MonWedFri, 4 hrs 0 min, 180NRe Optiflux, BFR 400, DFR Manual 500 mL/min, EDW 73.5 (kg), Dialysate 2.0 K, 2.0 Ca, Sodium 137 (mEq/L), Bicarb Setting: 35 (mEq/L), Access: AVFistula Sensipar 180MWF, hecterol 5MWF, iron 50 weekly, mircera 75 q2 wks(due April 4)  1. ESRD: MWF.    [place].  Status post dialysis 10/31/2020.  4 L removed 2. Volume/ hypertension: Underwent ultrafiltration of 4 L 3.Anemia of Chronic Kidney Disease:  Darbepoetin ordered 4. Secondary Hyperparathyroidism/Hyperphosphatemia:Continue home sensipar 180mg  and hecterol 21mcg w/ dialysis. On velphoro 1000 w/ meals. 5.Vascular access:LUE AVF w/ thrill and pulsatility. Prolonged bleeding on admission now resolved 6.AMS/Intracranial mass, meningioma w/ severe vasogenic edema:  likely cause of AMS and explains why worse on HD due to osmotic change/fluid shifts. Neurosurgery with plan for surgery Thursday. AMS much improved on steroids 7. PNA?: concern on CXR. Possible aspiration given AMS but feel this more likely represents pulmonary edema. Defer antibiotics to primary team    LOS: [place] @TODAY @7 :47 AM

## 2020-11-01 NOTE — Transfer of Care (Signed)
Immediate Anesthesia Transfer of Care Note  Patient: Ruth Gutierrez  Procedure(s) Performed: CRANIOTOMY FOR TUMOR EXCISION (N/A Head) PLACEMENT OF LUMBAR DRAIN (N/A )  Patient Location: PACU  Anesthesia Type:General  Level of Consciousness: drowsy, patient cooperative and responds to stimulation  Airway & Oxygen Therapy: Patient Spontanous Breathing  Post-op Assessment: Report given to RN and Post -op Vital signs reviewed and stable  Post vital signs: Reviewed and stable  Last Vitals:  Vitals Value Taken Time  BP 201/108 11/01/20 2111  Temp    Pulse 87 11/01/20 2114  Resp 25 11/01/20 2115  SpO2 94 % 11/01/20 2115  Vitals shown include unvalidated device data.  Last Pain:  Vitals:   11/01/20 1200  TempSrc:   PainSc: 0-No pain      Patients Stated Pain Goal: 0 (56/72/09 1980)  Complications: No complications documented.

## 2020-11-01 NOTE — Plan of Care (Signed)

## 2020-11-01 NOTE — Progress Notes (Signed)
eLink Physician-Brief Progress Note Patient Name: Ruth Gutierrez DOB: 01-Aug-1966 MRN: [id number]   Date of Service  11/01/2020  HPI/Events of Note  Request to alter Labetalol 10 mg IV PRN for goal MAP < 90.  eICU Interventions  Plan: 1. Change Labetalol order to 10 mg IV Q 10 min PRN MAP > 90.     Intervention Category Major Interventions: Hypertension - evaluation and management  Lysle Dingwall 11/01/2020, 11:33 PM

## 2020-11-01 NOTE — Anesthesia Procedure Notes (Signed)
Procedure Name: Intubation Date/Time: 11/01/2020 1:58 PM Performed by: Dorthea Cove, CRNA Pre-anesthesia Checklist: Patient identified, Emergency Drugs available, Suction available and Patient being monitored Patient Re-evaluated:Patient Re-evaluated prior to induction Oxygen Delivery Method: Circle system utilized Preoxygenation: Pre-oxygenation with 100% oxygen Induction Type: IV induction Ventilation: Mask ventilation without difficulty Laryngoscope Size: Mac and 3 Grade View: Grade I Tube type: Oral Tube size: 7.0 mm Number of attempts: 1 Airway Equipment and Method: Stylet and Oral airway Placement Confirmation: ETT inserted through vocal cords under direct vision,  positive ETCO2 and breath sounds checked- equal and bilateral Secured at: 23 cm Tube secured with: Tape Dental Injury: Teeth and Oropharynx as per pre-operative assessment

## 2020-11-01 NOTE — Consult Note (Signed)
NAME:  Ruth Gutierrez, MRN:  [id number], DOB:  10/31/1965, LOS: 3 ADMISSION DATE:  10/29/2020, CONSULTATION DATE:  11/01/2020 REFERRING MD:  Dr. Marcello Moores, CHIEF COMPLAINT:  Intracranial mass  History of Present Illness:   55 year old female with prior history of anuric ESRD (MWF, LUE AVF) s/p failed renal transplant 2010/ removed 2017, HTN, and anemia of chronic disease who presented on 3/28 after becoming lethargic at [facility].  Patient had reported SOB with productive clear cough, no fever for several days and progressive generalized weakness for several weeks.  Also reported intermittent severe frontal headaches for several months. No dialysis sessions missed.  Her workup showed CXR concerning for bibasilar pna vs pulmonary edema noted to be hypertensive 244/101 initially and hypoxic 89% on room air.  She was started on cefepime and vanc but developed itching and emesis with vanc, therefore stopped.  CTH showed significant large mass on cribriform plate c/w meningioma w/peritumoral cyst and with marked vasogenic edema.  She was started on dexamethasone and neurosurgery consulted. Nephrology consulted given ESRD. MRI confirmed large meningioma.  Since, admission, her mental status and dyspnea improved with decadron and dialysis.  She went 3/31 for resection by Dr. Marcello Moores.  She was extubated in PACU.  PCCM consulted for further medical management as patient to return to ICU post-operatively.   Pertinent  Medical History  Anuric ESRD (MWF, LUE AVF) s/p failed renal transplant 2010/ removed 2017, HTN, anemia of chronic disease  Tobacco abuse  Significant Hospital Events: Including procedures, antibiotic start and stop dates in addition to other pertinent events   3/28 >>> admitted to IMTS with AMS 2/2 intracranial mass.  NSGY and nephrology consulted.  Started on decadron.  Also being treated for possible HCAP- cefepime/ vanc but developed rxn to vanc.  Noted to be severely HTN.  MRI confirmed large  meningioma of the anterior cranial fossa causing severe vasogenic edema throughout both frontal lobes, left worse than right. 3/29 iHD- 2L off , feeling better, less SOB, improving mental status  3/30 iHD- 2L off  3/31 underwent resection of meningioma, returns to ICU postoperatively, extubated in PACU, MAP goal 70-90, R radial aline  Interim History / Subjective:  Returns hypertensive  C/o HA and nausea, denies vision changes No neuro deficits   Objective   Blood pressure (!) 159/81, pulse 79, temperature (P) 98.1 F (36.7 C), resp. rate 14, weight 75.5 kg, last menstrual period 06/02/2015, SpO2 96 %.      Aline reading 218/93 (140)  Intake/Output Summary (Last 24 hours) at 11/01/2020 2242 Last data filed at 11/01/2020 2115 Gross per 24 hour  Intake 2048 ml  Output 330 ml  Net 1718 ml   Filed Weights   10/31/20 0021 10/31/20 1426 10/31/20 1710  Weight: 76.7 kg 77.7 kg 75.5 kg    Examination: General:  Adult female sitting upright in bed in NAD HEENT: MM pale/moist, frontal dressing intact/ dry with hemovac in place, pupils 3/reactive Neuro: alert, oriented to person, place, time but not events, MAE, no focal weakness noted CV: rr, no murmur, LUE AVF PULM:  Non labored, coarse breath sounds, strong cough GI: soft, bs+, nt Extremities: warm/dry, no LE edema  Skin: no rashes   Labs/imaging that I havepersonally reviewed  (right click and "Reselect all SmartList Selections" daily)  3/28 CTH > large mass on cribriform plate c/w meningioma w/ peritumoral cyst with marked vasogenic edema and elevated ICP.   3/28 MRI brain >> 4.2 x 3.7 x 2.8 cm meningioma  of the anterior cranial fossa causing severe vasogenic edema throughout both frontal lobes, left worse than right.  3/29 TTE LVEF 60-65%, moderate LVH, G1DD, normal RV, mildly elevated PASP, moderate TR, trivial MR  3/31- ABG/ labs reviewed  Resolved Hospital Problem list    Assessment & Plan:   Large anterior skull base  tumor, likely meningioma per MRI with significant bifrontal edema  - s/p resection 3/31 by Dr. Marcello Moores - per NSGY - continue decadron per NSGY, 4mg  q8hr - further imaging per NSGY, planned MRI 4/1 am - serial neuro exams, q 1hr - cleviprex for MAP goal per NSGY 70-90 - low dose fentanyl prn pain/ tylenol pr - zofran prn nausea, hold for QTc > 500 - keppra for seizure ppx per NSGY - seizure precautions  - Maintain neuro protective measures; goal for eurothermia, euglycemia, eunatermia, normoxia, and PCO2 goal of 35-40 - NPO - follow surgical pathology  Hypoxia/ Dyspnea  - CAP vs most likely pulmonary edema.  Developed slight leukocytosis thought secondary to decadron.  S/p cefepime/ vanc (some rxn with vanc -itching/ emesis) - neg RVP / SARS - continue supplemental O2 for sat goal > 92% - aggressive pulmonary hygiene/ IS - continue azithro/ ceftriaxone x 5 days  Leukocytosis  - likely steroid induced; remains afebrile - PCT 0.79 on 3/28, could trend but given steroids, not going to be accurate  - started on cefepime/ vanc, changed to azithro/ ceftriaxone 3/29 >> - BCx 3/28 ngtd - neg MRSA 3/31 - trend WBC/ fever curve  HTN - holding home amlodipine, NPO - MAP parameters as above, goal 70-90, adding cleviprex  - continue Aline for now for strict parameters post op  ESRD, anuric  - per nephrology - K 5.7 at 1818, recheck now, no EKG changes present at this time, continue to monitor on tele - last iHD 3/29 and then again 3/30 with total UF 4L, volume removal has been gentle given elevated ICPs - trend renal indices / daily wts  Secondary hyperparathyroidism, hyperphosphatemia - trend phos - sensipar and hecerol per Nephrology  Anemia of chronic disease - noted to have bleeding from AVF on admit - s/p 1 unit PRBC 3/31 for Hgb 6.5-> 9.2 - transfuse for Hgb < 7 - mircera q 2 weeks/ iron weekly  Tobacco abuse - cessation counseling when appropriate   Best practice (right  click and "Reselect all SmartList Selections" daily)  Diet:  NPO Pain/Anxiety/Delirium protocol (if indicated): No VAP protocol (if indicated): Not indicated DVT prophylaxis: SCD GI prophylaxis: PPI Glucose control:  SSI No, consider if > 180 Central venous access:  N/A Arterial line:  Yes, and it is still needed Foley:  N/A Mobility:  bed rest  PT consulted: N/A Last date of multidisciplinary goals of care discussion [per primary] Code Status:  full code Disposition: Neuro ICU  Labs   CBC: Recent Labs  Lab 10/29/20 0944 10/29/20 1031 10/30/20 0302 10/30/20 1704 10/31/20 0424 10/31/20 1839 11/01/20 0945 11/01/20 1641 11/01/20 1818 11/01/20 1925  WBC 10.4  --  6.3 8.5 8.2 11.5* 10.1  --   --   --   NEUTROABS 8.9*  --   --   --  7.1  --   --   --   --   --   HGB 9.2*   < > 7.8* 7.7* 7.8* 8.4* 8.3* 6.5* 9.5* 9.2*  HCT 28.8*   < > 23.3* 23.0* 23.3* 25.9* 26.4* 19.0* 28.0* 27.0*  MCV 96.6  --  95.5 94.3  94.0 95.2 97.4  --   --   --   PLT 137*  --  100* 103* 107* 155 141*  --   --   --    < > = values in this interval not displayed.    Basic Metabolic Panel: Recent Labs  Lab 10/29/20 0944 10/29/20 1031 10/29/20 1346 10/29/20 1524 10/30/20 0302 10/31/20 0424 10/31/20 0932 10/31/20 1839 11/01/20 0344 11/01/20 1641 11/01/20 1818 11/01/20 1925  NA 137   < >  --   --  138 135  --  137 136 136 134* 136  K 4.1   < >  --   --  4.9 4.7  --  4.5 5.2* 4.9 5.7* 5.7*  CL 95*  --   --   --  94* 96*  --  97* 97* 103 102  --   CO2 27  --   --   --  29 26  --  29 28  --   --   --   GLUCOSE 100*  --   --   --  184* 126*  --  146* 139* 122* 139*  --   BUN 42*  --   --   --  62* 43*  --  28* 44* 50* 53*  --   CREATININE 8.27*  --   --   --  10.63* 7.72*  --  5.06* 6.69* 7.40* 8.10*  --   CALCIUM 9.4  --   --   --  8.7* 9.7  --  8.5* 7.5*  --   --   --   MG  --   --   --  2.7*  --   --  2.5*  --   --   --   --   --   PHOS  --   --  6.7*  --  7.6* 7.2*  --   --   --   --   --   --     < > = values in this interval not displayed.   GFR: CrCl cannot be calculated (Unknown ideal weight.). Recent Labs  Lab 10/29/20 0944 10/29/20 1502 10/29/20 1522 10/30/20 0302 10/30/20 1704 10/31/20 0424 10/31/20 1839 11/01/20 0945  PROCALCITON  --  0.79  --   --   --   --   --   --   WBC 10.4  --   --    < > 8.5 8.2 11.5* 10.1  LATICACIDVEN 2.4*  --  1.1  --   --   --   --   --    < > = values in this interval not displayed.    Liver Function Tests: Recent Labs  Lab 10/29/20 0944 10/30/20 0302 10/31/20 0424  AST 30  --   --   ALT 20  --   --   ALKPHOS 256*  --   --   BILITOT 1.2  --   --   PROT 6.7  --   --   ALBUMIN 3.3* 2.9* 3.0*   No results for input(s): LIPASE, AMYLASE in the last 168 hours. Recent Labs  Lab 10/29/20 1155  AMMONIA 16    ABG    Component Value Date/Time   PHART 7.437 11/01/2020 1925   PCO2ART 33.9 11/01/2020 1925   PO2ART 102 11/01/2020 1925   HCO3 22.9 11/01/2020 1925   TCO2 24 11/01/2020 1925   ACIDBASEDEF 1.0 11/01/2020 1925   O2SAT 98.0 11/01/2020 1925  Coagulation Profile: Recent Labs  Lab 10/31/20 1839  INR 1.4*    Cardiac Enzymes: No results for input(s): CKTOTAL, CKMB, CKMBINDEX, TROPONINI in the last 168 hours.  HbA1C: No results found for: HGBA1C  CBG: No results for input(s): GLUCAP in the last 168 hours.  Review of Systems:   Review of Systems  Constitutional: Negative for chills and fever.  Respiratory: Negative for cough, sputum production and shortness of breath.   Cardiovascular: Negative for chest pain and leg swelling.  Gastrointestinal: Positive for nausea. Negative for abdominal pain and vomiting.  Neurological: Positive for headaches. Negative for dizziness, tremors, sensory change, speech change, focal weakness and weakness.   Past Medical History:  She,  has a past medical history of Anemia of chronic disease, Arthritis, Deceased-donor kidney transplant, Eczema, ESRD (end stage renal  disease) ([place]), FUO (fever of unknown origin) (05/17/2015), GERD (gastroesophageal reflux disease), Headache(784.0), History of hyperparathyroidism, Hypertension, Peritonitis ([place]) (10/2019), Shortness of breath, and Wears glasses.   Surgical History:   Past Surgical History:  Procedure Laterality Date  . A/V FISTULAGRAM Left 02/12/2017   Procedure: A/V Fistulagram;  Surgeon: Algernon Huxley, MD;  Location: [place] [facility];  Service: Cardiovascular;  Laterality: Left;  . A/V FISTULAGRAM Left 12/30/2017   Procedure: A/V FISTULAGRAM;  Surgeon: Algernon Huxley, MD;  Location: [place] [facility];  Service: Cardiovascular;  Laterality: Left;  . A/V SHUNT INTERVENTION N/A 02/12/2017   Procedure: A/V Shunt Intervention;  Surgeon: Algernon Huxley, MD;  Location: [place] [facility];  Service: Cardiovascular;  Laterality: N/A;  . AV FISTULA PLACEMENT    . BASCILIC VEIN TRANSPOSITION Left 10/30/2014   Procedure: LEFT BASCILIC VEIN TRANSPOSITION;  Surgeon: Rosetta Posner, MD;  Location: [place];  Service: Vascular;  Laterality: Left;  . BASCILIC VEIN TRANSPOSITION Left 01/03/2015   Procedure: LEFT ARM 2ND STAGE BASCILIC VEIN TRANSPOSITION;  Surgeon: Rosetta Posner, MD;  Location: [place];  Service: Vascular;  Laterality: Left;  . CAPD REMOVAL N/A 10/31/2019   Procedure: PERITONEAL DIALYSIS  (CAPD) INFECTED CATHETER REMOVAL;  Surgeon: Coralie Keens, MD;  Location: [place];  Service: General;  Laterality: N/A;  . FRACTURE SURGERY     left foot,baby toe nad next toe missing  . INSERTION OF DIALYSIS CATHETER Right 10/30/2014   Procedure: INSERTION OF DIALYSIS CATHETER;  Surgeon: Rosetta Posner, MD;  Location: [place];  Service: Vascular;  Laterality: Right;  . KIDNEY TRANSPLANT  11/2008   [facility])  . WISDOM TOOTH EXTRACTION       Social History:   reports that she has been smoking cigarettes. She has a 13.50 pack-year smoking history. She has never used smokeless tobacco. She reports that she does not drink  alcohol and does not use drugs.   Family History:  Her family history includes Deep vein thrombosis in her brother; Diabetes in her brother; Hyperlipidemia in her sister; Hypertension in her father, mother, sister, and sister; Kidney disease in her brother. There is no history of Colon cancer, Esophageal cancer, or Rectal cancer.   Allergies Allergies  Allergen Reactions  . Penicillin G Itching and Rash  . Penicillins Itching and Rash    Did it involve swelling of the face/tongue/throat, SOB, or low BP?Y Did it involve sudden or severe rash/hives, skin peeling, or any reaction on the inside of your mouth or nose? Y Did you need to seek medical attention at a hospital or doctor's office? Y When did it last happen?2016 If all above  answers are "NO", may proceed with cephalosporin use.     Home Medications  Prior to Admission medications   Medication Sig Start Date End Date Taking? Authorizing Provider  amLODipine (NORVASC) 5 MG tablet Take 5 mg by mouth daily. 12/07/19  Yes [provider]  aspirin 81 MG EC tablet Take 81 mg by mouth as needed for pain. 05/15/15  Yes [provider]  calcitRIOL (ROCALTROL) 0.25 MCG capsule Take 0.25 mcg by mouth every Monday, Wednesday, and Friday with hemodialysis.   Yes [provider]  cholecalciferol (VITAMIN D3) 25 MCG (1000 UNIT) tablet Take 1,000 Units by mouth daily.   Yes [provider]  cinacalcet (SENSIPAR) 30 MG tablet Take 90 mg by mouth daily with supper. 09/11/20  Yes [provider]  lidocaine-prilocaine (EMLA) cream Apply 1 application topically every Monday, Wednesday, and Friday with hemodialysis. 02/01/20  Yes [provider]  Methoxy PEG-Epoetin Beta (MIRCERA IJ) Mircera 11/11/19 11/09/20 Yes [provider]  omeprazole (PRILOSEC OTC) 20 MG tablet Take 20 mg by mouth daily as needed for heartburn. 08/23/18  Yes [provider]  polyethylene glycol (MIRALAX / GLYCOLAX)  17 g packet Take 17 g by mouth daily. 10/11/19  Yes Seawell, Jaimie A, DO  potassium chloride SA (KLOR-CON) 20 MEQ tablet Take 2 tablets (40 mEq total) by mouth daily. Patient taking differently: Take 40 mEq by mouth every Monday, Wednesday, and Friday with hemodialysis. 10/11/19  Yes Seawell, Jaimie A, DO  sucroferric oxyhydroxide (VELPHORO) 500 MG chewable tablet Chew 1,500 mg by mouth 3 (three) times daily with meals. Taking one tablet with a snack sometimes 08/27/18  Yes [provider]     Critical care time: 50 mins     Kennieth Rad, ACNP [facility] 11/01/2020, 11:01 PM

## 2020-11-02 ENCOUNTER — Encounter (HOSPITAL_COMMUNITY): Payer: Self-pay | Admitting: Internal Medicine

## 2020-11-02 ENCOUNTER — Inpatient Hospital Stay (HOSPITAL_COMMUNITY): Payer: Medicare Other

## 2020-11-02 DIAGNOSIS — R0902 Hypoxemia: Secondary | ICD-10-CM | POA: Diagnosis not present

## 2020-11-02 DIAGNOSIS — N186 End stage renal disease: Secondary | ICD-10-CM | POA: Diagnosis not present

## 2020-11-02 DIAGNOSIS — Z992 Dependence on renal dialysis: Secondary | ICD-10-CM

## 2020-11-02 DIAGNOSIS — Z452 Encounter for adjustment and management of vascular access device: Secondary | ICD-10-CM | POA: Insufficient documentation

## 2020-11-02 DIAGNOSIS — G9389 Other specified disorders of brain: Secondary | ICD-10-CM | POA: Diagnosis not present

## 2020-11-02 DIAGNOSIS — J9601 Acute respiratory failure with hypoxia: Secondary | ICD-10-CM

## 2020-11-02 DIAGNOSIS — I129 Hypertensive chronic kidney disease with stage 1 through stage 4 chronic kidney disease, or unspecified chronic kidney disease: Secondary | ICD-10-CM | POA: Diagnosis not present

## 2020-11-02 LAB — RENAL FUNCTION PANEL
Albumin: 3.1 g/dL — ABNORMAL LOW (ref 3.5–5.0)
Albumin: 2.8 g/dL — ABNORMAL LOW (ref 3.5–5.0)
Anion gap: 18 — ABNORMAL HIGH (ref 5–15)
Anion gap: 14 (ref 5–15)
BUN: 72 mg/dL — ABNORMAL HIGH (ref 6–20)
BUN: 72 mg/dL — ABNORMAL HIGH (ref 6–20)
CO2: 19 mmol/L — ABNORMAL LOW (ref 22–32)
CO2: 23 mmol/L (ref 22–32)
Calcium: 7.6 mg/dL — ABNORMAL LOW (ref 8.9–10.3)
Calcium: 7.4 mg/dL — ABNORMAL LOW (ref 8.9–10.3)
Chloride: 99 mmol/L (ref 98–111)
Chloride: 98 mmol/L (ref 98–111)
Creatinine, Ser: 8.94 mg/dL — ABNORMAL HIGH (ref 0.44–1.00)
Creatinine, Ser: 8.83 mg/dL — ABNORMAL HIGH (ref 0.44–1.00)
GFR, Estimated: 5 mL/min — ABNORMAL LOW (ref >60)
GFR, Estimated: 5 mL/min — ABNORMAL LOW (ref >60)
Glucose, Bld: 149 mg/dL — ABNORMAL HIGH (ref 70–99)
Glucose, Bld: 148 mg/dL — ABNORMAL HIGH (ref 70–99)
Phosphorus: 8.4 mg/dL — ABNORMAL HIGH (ref 2.5–4.6)
Phosphorus: 8.7 mg/dL — ABNORMAL HIGH (ref 2.5–4.6)
Potassium: 5.4 mmol/L — ABNORMAL HIGH (ref 3.5–5.1)
Potassium: 5.4 mmol/L — ABNORMAL HIGH (ref 3.5–5.1)
Sodium: 136 mmol/L (ref 135–145)
Sodium: 135 mmol/L (ref 135–145)

## 2020-11-02 LAB — PREPARE PLATELET PHERESIS: Unit division: 0

## 2020-11-02 LAB — CBC WITH DIFFERENTIAL/PLATELET
Abs Immature Granulocytes: 0.12 10*3/uL — ABNORMAL HIGH (ref 0.00–0.07)
Basophils Absolute: 0 10*3/uL (ref 0.0–0.1)
Basophils Relative: 0 %
Eosinophils Absolute: 0 10*3/uL (ref 0.0–0.5)
Eosinophils Relative: 0 %
HCT: 29.9 % — ABNORMAL LOW (ref 36.0–46.0)
Hemoglobin: 10.3 g/dL — ABNORMAL LOW (ref 12.0–15.0)
Immature Granulocytes: 1 %
Lymphocytes Relative: 8 %
Lymphs Abs: 1.1 10*3/uL (ref 0.7–4.0)
MCH: 32.1 pg (ref 26.0–34.0)
MCHC: 34.4 g/dL (ref 30.0–36.0)
MCV: 93.1 fL (ref 80.0–100.0)
Monocytes Absolute: 1.8 10*3/uL — ABNORMAL HIGH (ref 0.1–1.0)
Monocytes Relative: 13 %
Neutro Abs: 11.2 10*3/uL — ABNORMAL HIGH (ref 1.7–7.7)
Neutrophils Relative %: 78 %
Platelets: 206 10*3/uL (ref 150–400)
RBC: 3.21 MIL/uL — ABNORMAL LOW (ref 3.87–5.11)
RDW: 15.5 % (ref 11.5–15.5)
WBC: 14.2 10*3/uL — ABNORMAL HIGH (ref 4.0–10.5)
nRBC: 0 % (ref 0.0–0.2)

## 2020-11-02 LAB — GLUCOSE, CAPILLARY
Glucose-Capillary: 167 mg/dL — ABNORMAL HIGH (ref 70–99)
Glucose-Capillary: 139 mg/dL — ABNORMAL HIGH (ref 70–99)
Glucose-Capillary: 126 mg/dL — ABNORMAL HIGH (ref 70–99)
Glucose-Capillary: 133 mg/dL — ABNORMAL HIGH (ref 70–99)
Glucose-Capillary: 146 mg/dL — ABNORMAL HIGH (ref 70–99)
Glucose-Capillary: 133 mg/dL — ABNORMAL HIGH (ref 70–99)

## 2020-11-02 LAB — BPAM PLATELET PHERESIS
Blood Product Expiration Date: 202203312359
ISSUE DATE / TIME: 202203311940
Unit Type and Rh: 6200

## 2020-11-02 IMAGING — DX DG CHEST 1V PORT
1 series · 1 of 1 positions shown · non-contrast
Comparison: [DATE]

CLINICAL DATA: Central catheter placement

EXAM:
PORTABLE CHEST 1 VIEW

[chest ap]
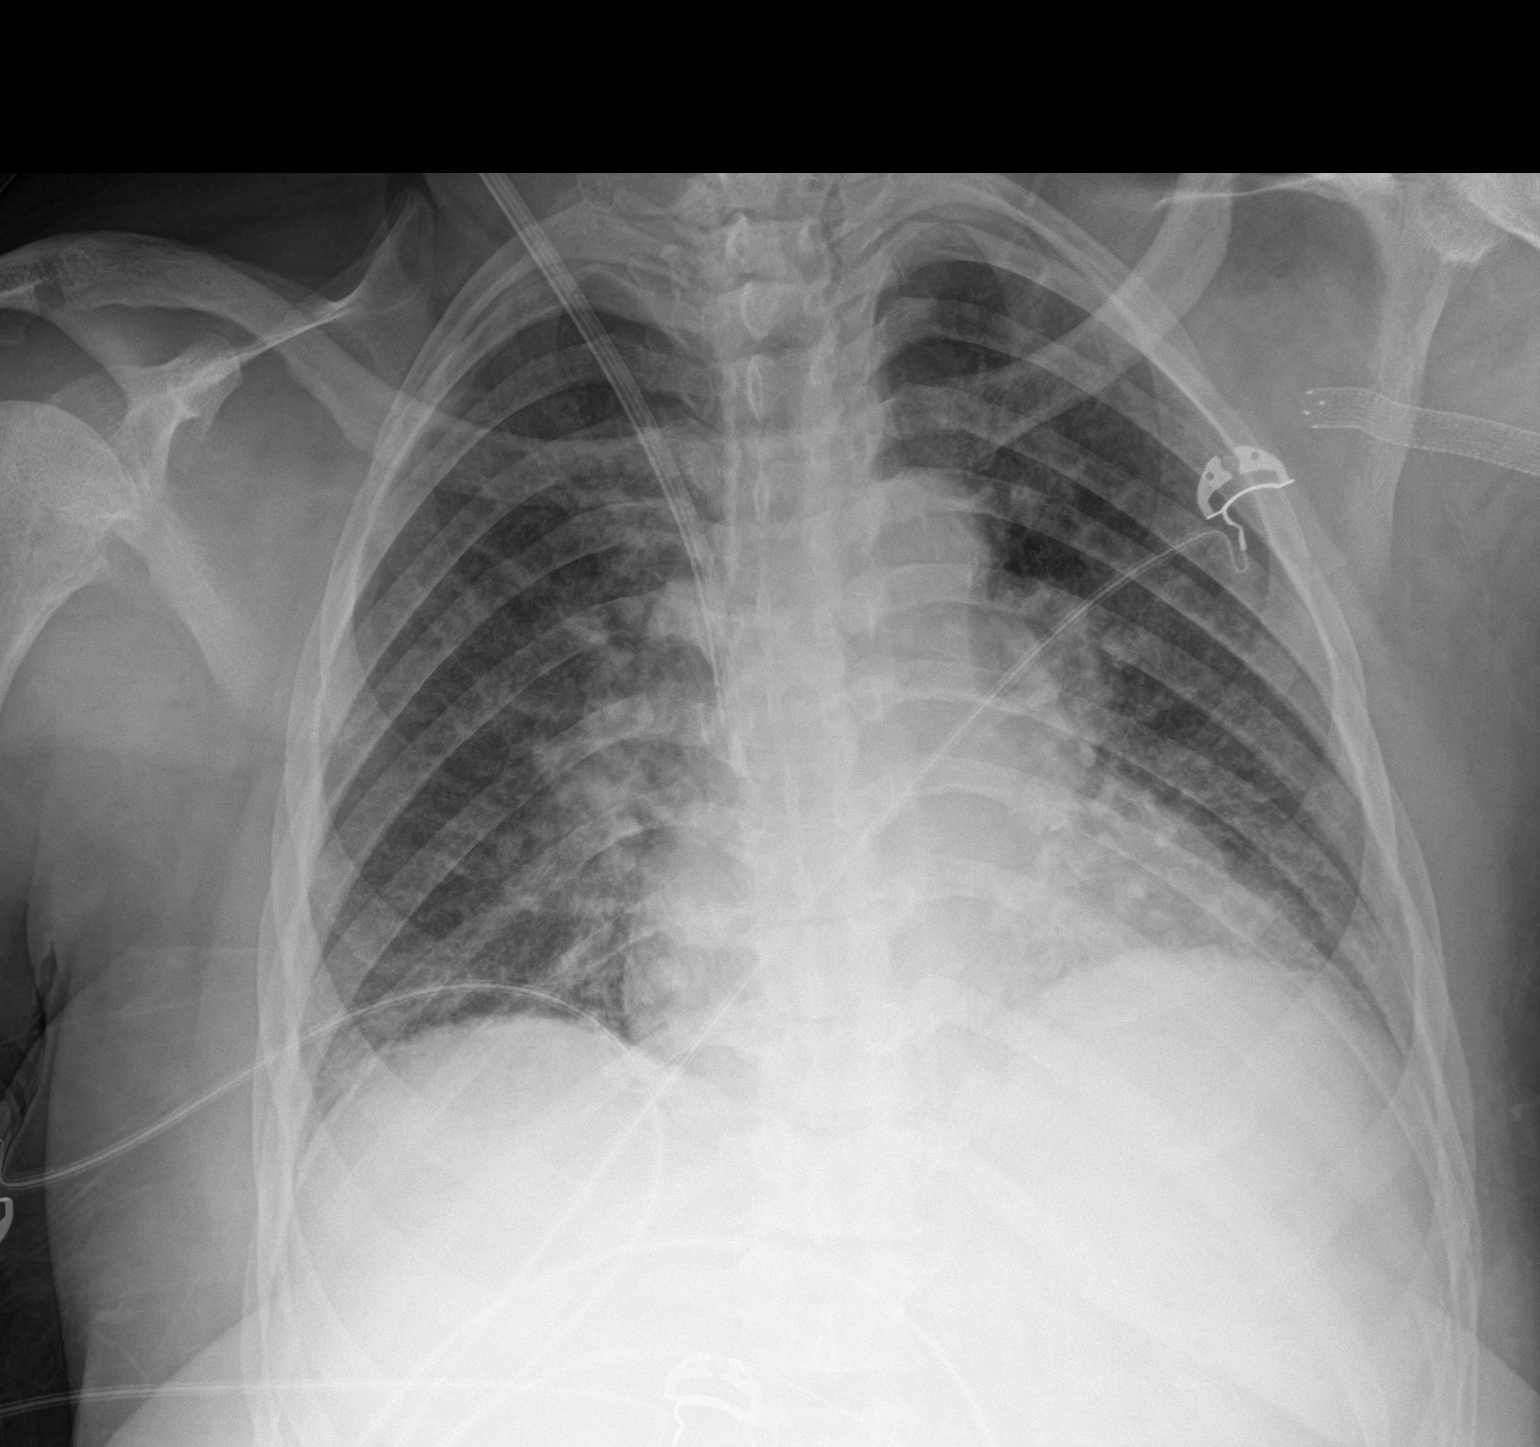

[1 of 1 positions shown; findings below may reference images not displayed]

FINDINGS: Central catheter tip is in the superior vena cava near the
cavoatrial junction. No pneumothorax. There is interstitial
pulmonary edema, similar to 2 days prior. No consolidation. There is
cardiomegaly with mild pulmonary venous hypertension. No adenopathy.
No bone lesions.
IMPRESSION: Central catheter tip in superior vena cava near cavoatrial junction.
No pneumothorax. Persistent cardiomegaly with a degree of pulmonary
vascular congestion and interstitial pulmonary edema. No
consolidation. Stable cardiac silhouette.

## 2020-11-02 MED ORDER — LORAZEPAM 2 MG/ML IJ SOLN
0.5000 mg | Freq: Once | INTRAMUSCULAR | Status: AC
  Administered 2020-11-02: 0.5 mg via INTRAVENOUS

## 2020-11-02 MED ORDER — SODIUM CHLORIDE 0.9 % FOR CRRT: INTRAVENOUS_CENTRAL | Status: DC | PRN

## 2020-11-02 MED ORDER — SODIUM CHLORIDE 0.9% FLUSH: 10.0000 mL | INTRAVENOUS | Status: DC | PRN

## 2020-11-02 MED ORDER — HEPARIN SODIUM (PORCINE) 1000 UNIT/ML DIALYSIS
1000.0000 [IU] | INTRAMUSCULAR | Status: DC | PRN
Start: 2020-11-02 — End: 2020-11-05
  Administered 2020-11-04: 3000 [IU] via INTRAVENOUS_CENTRAL
  Administered 2020-11-05: 2400 [IU] via INTRAVENOUS_CENTRAL
  Filled 2020-11-02 (×4): qty 6
  Filled 2020-11-02: qty 3

## 2020-11-02 MED ORDER — PRISMASOL BGK 4/2.5 32-4-2.5 MEQ/L EC SOLN
Status: DC
  Filled 2020-11-02 (×22): qty 5000

## 2020-11-02 MED ORDER — SODIUM CHLORIDE 0.9 % IV SOLN: 100.0000 mL | INTRAVENOUS | Status: DC | PRN

## 2020-11-02 MED ORDER — PRISMASOL BGK 4/2.5 32-4-2.5 MEQ/L REPLACEMENT SOLN
Status: DC
  Filled 2020-11-02 (×5): qty 5000

## 2020-11-02 MED ORDER — SODIUM CHLORIDE 0.9% FLUSH
10.0000 mL | Freq: Two times a day (BID) | INTRAVENOUS | Status: DC
  Administered 2020-11-02 – 2020-11-09 (×13): 10 mL

## 2020-11-02 MED ORDER — VANCOMYCIN HCL 750 MG/150ML IV SOLN
750.0000 mg | INTRAVENOUS | Status: AC
  Administered 2020-11-02 – 2020-11-03 (×2): 750 mg via INTRAVENOUS
  Filled 2020-11-02 (×2): qty 150

## 2020-11-02 MED ORDER — ALTEPLASE 2 MG IJ SOLR: 2.0000 mg | Freq: Once | INTRAMUSCULAR | Status: DC | PRN

## 2020-11-02 MED ORDER — HYDROMORPHONE HCL 1 MG/ML IJ SOLN
0.2000 mg | INTRAMUSCULAR | Status: DC | PRN
  Administered 2020-11-02 – 2020-11-04 (×15): 0.2 mg via INTRAVENOUS
  Filled 2020-11-02 (×16): qty 1

## 2020-11-02 MED ORDER — HEPARIN SODIUM (PORCINE) 1000 UNIT/ML DIALYSIS
1000.0000 [IU] | INTRAMUSCULAR | Status: DC | PRN
  Administered 2020-11-02: 1000 [IU] via INTRAVENOUS_CENTRAL
  Filled 2020-11-02 (×3): qty 1

## 2020-11-02 MED ORDER — LIDOCAINE-PRILOCAINE 2.5-2.5 % EX CREA
1.0000 "application " | TOPICAL_CREAM | CUTANEOUS | Status: DC | PRN
  Filled 2020-11-02: qty 5

## 2020-11-02 MED ORDER — LIDOCAINE-PRILOCAINE 2.5-2.5 % EX CREA
TOPICAL_CREAM | CUTANEOUS | Status: DC
  Filled 2020-11-02: qty 5

## 2020-11-02 MED ORDER — PENTAFLUOROPROP-TETRAFLUOROETH EX AERO: 1.0000 "application " | INHALATION_SPRAY | CUTANEOUS | Status: DC | PRN

## 2020-11-02 MED ORDER — DEXMEDETOMIDINE HCL IN NACL 400 MCG/100ML IV SOLN
0.4000 ug/kg/h | INTRAVENOUS | Status: DC
  Administered 2020-11-02: 0.9 ug/kg/h via INTRAVENOUS
  Administered 2020-11-02: 0.4 ug/kg/h via INTRAVENOUS
  Administered 2020-11-03: 1.2 ug/kg/h via INTRAVENOUS
  Administered 2020-11-03: 1 ug/kg/h via INTRAVENOUS
  Administered 2020-11-03: 0.9 ug/kg/h via INTRAVENOUS
  Administered 2020-11-03 – 2020-11-04 (×5): 1.2 ug/kg/h via INTRAVENOUS
  Administered 2020-11-04: 1 ug/kg/h via INTRAVENOUS
  Administered 2020-11-05: 0.6 ug/kg/h via INTRAVENOUS
  Filled 2020-11-02 (×12): qty 100

## 2020-11-02 MED ORDER — LORAZEPAM 2 MG/ML IJ SOLN
0.5000 mg | Freq: Once | INTRAMUSCULAR | Status: AC
  Administered 2020-11-02: 0.5 mg via INTRAVENOUS
  Filled 2020-11-02: qty 1

## 2020-11-02 MED ORDER — LIDOCAINE HCL (PF) 1 % IJ SOLN: 5.0000 mL | INTRAMUSCULAR | Status: DC | PRN

## 2020-11-02 MED ORDER — PRISMASOL BGK 4/2.5 32-4-2.5 MEQ/L REPLACEMENT SOLN
Status: DC
  Filled 2020-11-02 (×4): qty 5000

## 2020-11-02 MED FILL — Thrombin For Soln Kit 20000 Unit: CUTANEOUS | Qty: 1 | Status: AC

## 2020-11-02 NOTE — Progress Notes (Signed)
Patient has been angry and belligerent.  States that she is refusing to go to MRI until the NS MD comes and speaks with her.  I messaged MD and am still waiting for response.  I Called Mri and let the mknow the current situation.

## 2020-11-02 NOTE — Progress Notes (Signed)
This RN went in patient's room to check on her and she said she went to the bathroom in the bed. I offered to help clean her up and she said "no thank you, just go home and leave me alone have a good night". Patient continued to be verbally abusive to all staff going into room. (see flagged flow sheet neuro assessment for more info). Will continue to monitor patient. :)

## 2020-11-02 NOTE — Anesthesia Postprocedure Evaluation (Signed)
Anesthesia Post Note  Patient: Ruth Gutierrez [id number]  Procedure(s) Performed: CRANIOTOMY FOR TUMOR EXCISION (N/A Head) PLACEMENT OF LUMBAR DRAIN (N/A )     Patient location during evaluation: PACU Anesthesia Type: General Level of consciousness: sedated and patient cooperative Pain management: pain level controlled Vital Signs Assessment: post-procedure vital signs reviewed and stable Respiratory status: spontaneous breathing Cardiovascular status: stable Anesthetic complications: no   No complications documented.  Last Vitals:  Vitals:   11/01/20 2330 11/02/20 0000  BP:  122/66  Pulse: 78 81  Resp: 15 16  Temp:    SpO2: 95% 95%    Last Pain:  Vitals:   11/02/20 0000  TempSrc:   PainSc: 10-Worst pain ever                 Nolon Nations

## 2020-11-02 NOTE — Progress Notes (Signed)
NAME:  Ruth Gutierrez, MRN:  [id number], DOB:  04/20/1966, LOS: 4 ADMISSION DATE:  10/29/2020, CONSULTATION DATE:  11/01/2020 REFERRING MD:  Dr. Marcello Moores, CHIEF COMPLAINT:  Intracranial mass  History of Present Illness:  55 year old female with prior history of anuric ESRD (MWF, LUE AVF) s/p failed renal transplant 2010/ removed 2017, HTN, and anemia of chronic disease who presented on 3/28 after becoming lethargic at [facility].  Patient had reported SOB with productive clear cough, no fever for several days and progressive generalized weakness for several weeks.  Also reported intermittent severe frontal headaches for several months. No dialysis sessions missed.  Her workup showed CXR concerning for bibasilar pna vs pulmonary edema noted to be hypertensive 244/101 initially and hypoxic 89% on room air.  She was started on cefepime and vanc but developed itching and emesis with vanc, therefore stopped.  CTH showed significant large mass on cribriform plate c/w meningioma w/peritumoral cyst and with marked vasogenic edema.  She was started on dexamethasone and neurosurgery consulted. Nephrology consulted given ESRD. MRI confirmed large meningioma.  Since, admission, her mental status and dyspnea improved with decadron and dialysis.  She went 3/31 for resection by Dr. Marcello Moores.  She was extubated in PACU.  PCCM consulted for further medical management as patient to return to ICU post-operatively.   Pertinent  Medical History  Anuric ESRD (MWF, LUE AVF) s/p failed renal transplant 2010/ removed 2017, HTN, anemia of chronic disease  Tobacco abuse  Significant Hospital Events: Including procedures, antibiotic start and stop dates in addition to other pertinent events   3/28 >>> admitted to IMTS with AMS 2/2 intracranial mass.  NSGY and nephrology consulted.  Started on decadron.  Also being treated for possible HCAP- cefepime/ vanc but developed rxn to vanc.  Noted to be severely HTN.  MRI confirmed large  meningioma of the anterior cranial fossa causing severe vasogenic edema throughout both frontal lobes, left worse than right. 3/29 iHD- 2L off , feeling better, less SOB, improving mental status  3/30 iHD- 2L off  3/31 underwent resection of meningioma, returns to ICU postoperatively, extubated in PACU, MAP goal 70-90, R radial aline  Interim History / Subjective:  Patient is complaining of severe headache, stated medication helps for a little while but it comes right back, not being able to sleep  Objective   Blood pressure 139/72, pulse 76, temperature 98.7 F (37.1 C), temperature source Oral, resp. rate 17, weight 75.5 kg, last menstrual period 06/02/2015, SpO2 96 %.      Aline reading 218/93 (140)  Intake/Output Summary (Last 24 hours) at 11/02/2020 1030 Last data filed at 11/02/2020 0800 Gross per 24 hour  Intake 2609.26 ml  Output 490 ml  Net 2119.26 ml   Filed Weights   10/31/20 0021 10/31/20 1426 10/31/20 1710  Weight: 76.7 kg 77.7 kg 75.5 kg    Examination: General:  Adult female sitting upright in bed HEENT: MM pale/moist, frontal dressing intact/ dry with hemovac in place, pupils 3/reactive Neuro: Alert, awake, following simple commands, antigravity in all 4 extremities. CV: Regular rate and rhythm, no murmur, LUE AVF PULM:  Non labored, coarse breath sounds, strong cough GI: Soft, nondistended bowel sounds present Extremities: warm/dry, no LE edema  Skin: no rashes   Labs/imaging that I havepersonally reviewed  (right click and "Reselect all SmartList Selections" daily)  3/28 CTH > large mass on cribriform plate c/w meningioma w/ peritumoral cyst with marked vasogenic edema and elevated ICP.   3/28 MRI brain >>  4.2 x 3.7 x 2.8 cm meningioma of the anterior cranial fossa causing severe vasogenic edema throughout both frontal lobes, left worse than right.  3/29 TTE LVEF 60-65%, moderate LVH, G1DD, normal RV, mildly elevated PASP, moderate TR, trivial MR  Resolved  Hospital Problem list    Assessment & Plan:   Large anterior skull base tumor, likely meningioma per MRI with significant bifrontal edema  s/p resection 3/31 by Dr. Marcello Moores Management per neurosurgery Continue Decadron 4 mg every 8 hours Continue neuro check every hour Map goal 70-90 Switch fentanyl to low-dose Dilaudid, continue Tylenol Avoid NSAIDs Continue zofran prn nausea, hold for QTc > 500 Continue keppra for seizure ppx per NSGY follow surgical pathology  Acute hypoxic respiratory failure Likely due to volume overload Currently on 2 L oxygen via nasal cannula DC IV antibiotics  HTN  holding home amlodipine, NPO MAP parameters as above, goal 70-90, continue as needed labetalol  ESRD on HD HD scheduled per nephrology  Secondary hyperparathyroidism, hyperphosphatemia Monitor phosphorus Continue sensipar and hecerol per Nephrology  Anemia of chronic disease with ABLA Patient noted to have bleeding from AVF on admit s/p 1 unit PRBC 3/31 for Hgb 6.5-> 9.2 Monitor H&H, transfuse if less than 7 mircera q 2 weeks/ iron weekly  Tobacco abuse Continue nicotine patch to prevent nicotine withdrawal Counseling provided regarding quit smoking  Best practice (right click and "Reselect all SmartList Selections" daily)  Diet:  NPO Pain/Anxiety/Delirium protocol (if indicated): No VAP protocol (if indicated): Not indicated DVT prophylaxis: SCD GI prophylaxis: PPI Glucose control:  SSI No, consider if > 180 Central venous access:  N/A Arterial line:  Yes, and it is still needed Foley:  N/A Mobility:  bed rest  PT consulted: N/A Last date of multidisciplinary goals of care discussion [per primary] Code Status:  full code Disposition: Neuro ICU  Labs   CBC: Recent Labs  Lab 10/29/20 0944 10/29/20 1031 10/30/20 1704 10/31/20 0424 10/31/20 1839 11/01/20 0945 11/01/20 1641 11/01/20 1818 11/01/20 1925 11/02/20 0500  WBC 10.4   < > 8.5 8.2 11.5* 10.1  --   --   --   14.2*  NEUTROABS 8.9*  --   --  7.1  --   --   --   --   --  11.2*  HGB 9.2*   < > 7.7* 7.8* 8.4* 8.3* 6.5* 9.5* 9.2* 10.3*  HCT 28.8*   < > 23.0* 23.3* 25.9* 26.4* 19.0* 28.0* 27.0* 29.9*  MCV 96.6   < > 94.3 94.0 95.2 97.4  --   --   --  93.1  PLT 137*   < > 103* 107* 155 141*  --   --   --  206   < > = values in this interval not displayed.    Basic Metabolic Panel: Recent Labs  Lab 10/29/20 1346 10/29/20 1524 10/30/20 0302 10/31/20 0424 10/31/20 0932 10/31/20 1839 11/01/20 0344 11/01/20 1641 11/01/20 1818 11/01/20 1925 11/02/20 0500  NA  --   --  138 135  --  137 136 136 134* 136 136  K  --   --  4.9 4.7  --  4.5 5.2* 4.9 5.7* 5.7* 5.4*  CL  --   --  94* 96*  --  97* 97* 103 102  --  99  CO2  --   --  29 26  --  29 28  --   --   --  19*  GLUCOSE  --   --  184* 126*  --  146* 139* 122* 139*  --  149*  BUN  --   --  62* 43*  --  28* 44* 50* 53*  --  72*  CREATININE  --   --  10.63* 7.72*  --  5.06* 6.69* 7.40* 8.10*  --  8.94*  CALCIUM  --   --  8.7* 9.7  --  8.5* 7.5*  --   --   --  7.6*  MG  --  2.7*  --   --  2.5*  --   --   --   --   --   --   PHOS 6.7*  --  7.6* 7.2*  --   --   --   --   --   --  8.4*   GFR: CrCl cannot be calculated (Unknown ideal weight.). Recent Labs  Lab 10/29/20 0944 10/29/20 1502 10/29/20 1522 10/30/20 0302 10/31/20 0424 10/31/20 1839 11/01/20 0945 11/02/20 0500  PROCALCITON  --  0.79  --   --   --   --   --   --   WBC 10.4  --   --    < > 8.2 11.5* 10.1 14.2*  LATICACIDVEN 2.4*  --  1.1  --   --   --   --   --    < > = values in this interval not displayed.    Liver Function Tests: Recent Labs  Lab 10/29/20 0944 10/30/20 0302 10/31/20 0424 11/02/20 0500  AST 30  --   --   --   ALT 20  --   --   --   ALKPHOS 256*  --   --   --   BILITOT 1.2  --   --   --   PROT 6.7  --   --   --   ALBUMIN 3.3* 2.9* 3.0* 3.1*   No results for input(s): LIPASE, AMYLASE in the last 168 hours. Recent Labs  Lab 10/29/20 1155  AMMONIA 16     ABG    Component Value Date/Time   PHART 7.437 11/01/2020 1925   PCO2ART 33.9 11/01/2020 1925   PO2ART 102 11/01/2020 1925   HCO3 22.9 11/01/2020 1925   TCO2 24 11/01/2020 1925   ACIDBASEDEF 1.0 11/01/2020 1925   O2SAT 98.0 11/01/2020 1925     Coagulation Profile: Recent Labs  Lab 10/31/20 1839  INR 1.4*    Cardiac Enzymes: No results for input(s): CKTOTAL, CKMB, CKMBINDEX, TROPONINI in the last 168 hours.  HbA1C: No results found for: HGBA1C  CBG: Recent Labs  Lab 11/01/20 2334 11/02/20 0334 11/02/20 0718  GLUCAP 192* 167* 139*     Past Medical History:  She,  has a past medical history of Anemia of chronic disease, Arthritis, Deceased-donor kidney transplant, Eczema, ESRD (end stage renal disease) ([place]), FUO (fever of unknown origin) (05/17/2015), GERD (gastroesophageal reflux disease), Headache(784.0), History of hyperparathyroidism, Hypertension, Peritonitis ([place]) (10/2019), Shortness of breath, and Wears glasses.   Surgical History:   Past Surgical History:  Procedure Laterality Date  . A/V FISTULAGRAM Left 02/12/2017   Procedure: A/V Fistulagram;  Surgeon: Algernon Huxley, MD;  Location: [place] [facility];  Service: Cardiovascular;  Laterality: Left;  . A/V FISTULAGRAM Left 12/30/2017   Procedure: A/V FISTULAGRAM;  Surgeon: Algernon Huxley, MD;  Location: [place] [facility];  Service: Cardiovascular;  Laterality: Left;  . A/V SHUNT INTERVENTION N/A 02/12/2017   Procedure: A/V Shunt Intervention;  Surgeon: Lucky Cowboy,  Erskine Squibb, MD;  Location: [place] [facility];  Service: Cardiovascular;  Laterality: N/A;  . AV FISTULA PLACEMENT    . BASCILIC VEIN TRANSPOSITION Left 10/30/2014   Procedure: LEFT BASCILIC VEIN TRANSPOSITION;  Surgeon: Rosetta Posner, MD;  Location: [place];  Service: Vascular;  Laterality: Left;  . BASCILIC VEIN TRANSPOSITION Left 01/03/2015   Procedure: LEFT ARM 2ND STAGE BASCILIC VEIN TRANSPOSITION;  Surgeon: Rosetta Posner, MD;  Location: [place];  Service:  Vascular;  Laterality: Left;  . CAPD REMOVAL N/A 10/31/2019   Procedure: PERITONEAL DIALYSIS  (CAPD) INFECTED CATHETER REMOVAL;  Surgeon: Coralie Keens, MD;  Location: [place];  Service: General;  Laterality: N/A;  . FRACTURE SURGERY     left foot,baby toe nad next toe missing  . INSERTION OF DIALYSIS CATHETER Right 10/30/2014   Procedure: INSERTION OF DIALYSIS CATHETER;  Surgeon: Rosetta Posner, MD;  Location: [place];  Service: Vascular;  Laterality: Right;  . KIDNEY TRANSPLANT  11/2008   [facility])  . WISDOM TOOTH EXTRACTION       Social History:   reports that she has been smoking cigarettes. She has a 13.50 pack-year smoking history. She has never used smokeless tobacco. She reports that she does not drink alcohol and does not use drugs.   Family History:  Her family history includes Deep vein thrombosis in her brother; Diabetes in her brother; Hyperlipidemia in her sister; Hypertension in her father, mother, sister, and sister; Kidney disease in her brother. There is no history of Colon cancer, Esophageal cancer, or Rectal cancer.   Allergies Allergies  Allergen Reactions  . Penicillin G Itching and Rash  . Penicillins Itching and Rash    Did it involve swelling of the face/tongue/throat, SOB, or low BP?Y Did it involve sudden or severe rash/hives, skin peeling, or any reaction on the inside of your mouth or nose? Y Did you need to seek medical attention at a hospital or doctor's office? Y When did it last happen?2016 If all above answers are "NO", may proceed with cephalosporin use.     Home Medications  Prior to Admission medications   Medication Sig Start Date End Date Taking? Authorizing Provider  amLODipine (NORVASC) 5 MG tablet Take 5 mg by mouth daily. 12/07/19  Yes [provider]  aspirin 81 MG EC tablet Take 81 mg by mouth as needed for pain. 05/15/15  Yes [provider]  calcitRIOL (ROCALTROL) 0.25 MCG capsule Take 0.25 mcg by mouth every  Monday, Wednesday, and Friday with hemodialysis.   Yes [provider]  cholecalciferol (VITAMIN D3) 25 MCG (1000 UNIT) tablet Take 1,000 Units by mouth daily.   Yes [provider]  cinacalcet (SENSIPAR) 30 MG tablet Take 90 mg by mouth daily with supper. 09/11/20  Yes [provider]  lidocaine-prilocaine (EMLA) cream Apply 1 application topically every Monday, Wednesday, and Friday with hemodialysis. 02/01/20  Yes [provider]  Methoxy PEG-Epoetin Beta (MIRCERA IJ) Mircera 11/11/19 11/09/20 Yes [provider]  omeprazole (PRILOSEC OTC) 20 MG tablet Take 20 mg by mouth daily as needed for heartburn. 08/23/18  Yes [provider]  polyethylene glycol (MIRALAX / GLYCOLAX) 17 g packet Take 17 g by mouth daily. 10/11/19  Yes Seawell, Jaimie A, DO  potassium chloride SA (KLOR-CON) 20 MEQ tablet Take 2 tablets (40 mEq total) by mouth daily. Patient taking differently: Take 40 mEq by mouth every Monday, Wednesday, and Friday with hemodialysis. 10/11/19  Yes Seawell, Jaimie A, DO  sucroferric oxyhydroxide (VELPHORO) 500 MG chewable tablet Chew 1,500 mg by mouth 3 (three) times daily with meals. Taking one tablet with a snack sometimes 08/27/18  Yes [provider]     Total critical care time: 44 minutes  Performed by: [place] care time was exclusive of separately billable procedures and treating other patients.   Critical care was necessary to treat or prevent imminent or life-threatening deterioration.   Critical care was time spent personally by me on the following activities: development of treatment plan with patient and/or surrogate as well as nursing, discussions with consultants, evaluation of patient's response to treatment, examination of patient, obtaining history from patient or surrogate, ordering and performing treatments and interventions, ordering and review of laboratory studies, ordering and review of radiographic  studies, pulse oximetry and re-evaluation of patient's condition.   Jacky Kindle MD [facility] See Amion for pager If no response to pager, please call [phone] until 7pm After 7pm, Please call E-link [phone]

## 2020-11-02 NOTE — Progress Notes (Signed)
Pharmacy - Vancomycin x 48 hours / while drain in place  S/p neurosurgery  ESRD -- > HD MWF  Received dose of Vancomycin 1 gram iv x 1 3/31  Plan for CRRT today. Will adjust vanc for this  Plan: Vanc 750mg  IV q24 x2  Onnie Boer, PharmD, [place], AAHIVP, CPP Infectious Disease Pharmacist 11/02/2020 12:21 PM

## 2020-11-02 NOTE — Procedures (Signed)
Central Venous Catheter Insertion Procedure Note  Ruth Gutierrez  [id number]  Jan 15, 1966  Date:11/02/20  Time:1:21 PM   Provider Performing:Pete Johnette Abraham Kary Kos   Procedure: Insertion of Non-tunneled Central Venous Catheter(36556)with US guidance (24462)    Indication(s) Hemodialysis  Consent Risks of the procedure as well as the alternatives and risks of each were explained to the patient and/or caregiver.  Consent for the procedure was obtained and is signed in the bedside chart  Anesthesia Topical only with 1% lidocaine   Timeout Verified patient identification, verified procedure, site/side was marked, verified correct patient position, special equipment/implants available, medications/allergies/relevant history reviewed, required imaging and test results available.  Sterile Technique Maximal sterile technique including full sterile barrier drape, hand hygiene, sterile gown, sterile gloves, mask, hair covering, sterile ultrasound probe cover (if used).  Procedure Description Area of catheter insertion was cleaned with chlorhexidine and draped in sterile fashion.   With real-time ultrasound guidance a HD catheter was placed into the right internal jugular vein.  Nonpulsatile blood flow and easy flushing noted in all ports.  The catheter was sutured in place and sterile dressing applied.  Complications/Tolerance None; patient tolerated the procedure well. Chest X-ray is ordered to verify placement for internal jugular or subclavian cannulation.  Chest x-ray is not ordered for femoral cannulation.  EBL Minimal  Specimen(s) None  Erick Colace ACNP-BC [place] Pager # [phone] OR # [phone] if no answer

## 2020-11-02 NOTE — Progress Notes (Addendum)
[facility] ROUNDING NOTE   Subjective:   History: 55 year old lady end-stage renal disease status post failed transplant 2010 history of hypertension altered mental status admitted for meningioma with significant vasogenic edema.  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  Appreciate assistance of Dr. Marcello Moores.  She received a dialysis treatment in screens per kidney center on a Monday Wednesday Friday schedule.  Discussed case with Dr. Marcello Moores.  We will proceed with CRRT in place of intermittent hemodialysis in order to minimize osmotic shifts.  We will also run low flow rates  Blood pressure blood pressure 139/72 pulse 81 temperature 98.7 O2 sats 96% room air  Sodium 136 potassium 5.4 chloride 99 CO2 19 BUN 72 creatinine 8.94 glucose 149 calcium 7.6 phosphorus 8.5 hemoglobin 10.3  Decadron every 8 hours, Hectorol 5 mcg Monday Wednesday Friday Aranesp 100 mcg weekly   Objective:  Vital signs in last 24 hours:  Temp:  [97.9 F (36.6 C)-98.7 F (37.1 C)] 98.7 F (37.1 C) (04/01 0400) Pulse Rate:  [70-87] 76 (04/01 1000) Resp:  [13-25] 17 (04/01 1000) BP: (113-201)/(63-169) 139/72 (04/01 1000) SpO2:  [94 %-100 %] 96 % (04/01 1000) Arterial Line BP: (110-215)/(51-86) 158/70 (04/01 1000)  Weight change:  Filed Weights   10/31/20 0021 10/31/20 1426 10/31/20 1710  Weight: 76.7 kg 77.7 kg 75.5 kg    Intake/Output: I/O last 3 completed shifts: In: 2557.3 [I.V.:1559.3; Blood:898; IV Piggyback:100] Out: 330 [Other:30; Blood:300]   Intake/Output this shift:  Total I/O In: 52 [I.V.:52] Out: 160 [Drains:160]  Constitutional: well-appearing, no acute distress ENMT: ears and nose without scars or lesions, MMM CV: normal rate, no edema Respiratory: clear to auscultation, normal work of breathing Gastrointestinal: soft, non-tender, no palpable masses or hernias Skin: no visible lesions or rashes Psych: Awake, alert, answers most  questions appropriately   Basic Metabolic Panel: Recent Labs  Lab 10/29/20 1346 10/29/20 1524 10/30/20 0302 10/31/20 0424 10/31/20 0932 10/31/20 1839 11/01/20 0344 11/01/20 1641 11/01/20 1818 11/01/20 1925 11/02/20 0500  NA  --   --  138 135  --  137 136 136 134* 136 136  K  --   --  4.9 4.7  --  4.5 5.2* 4.9 5.7* 5.7* 5.4*  CL  --   --  94* 96*  --  97* 97* 103 102  --  99  CO2  --   --  29 26  --  29 28  --   --   --  19*  GLUCOSE  --   --  184* 126*  --  146* 139* 122* 139*  --  149*  BUN  --   --  62* 43*  --  28* 44* 50* 53*  --  72*  CREATININE  --   --  10.63* 7.72*  --  5.06* 6.69* 7.40* 8.10*  --  8.94*  CALCIUM  --   --  8.7* 9.7  --  8.5* 7.5*  --   --   --  7.6*  MG  --  2.7*  --   --  2.5*  --   --   --   --   --   --   PHOS 6.7*  --  7.6* 7.2*  --   --   --   --   --   --  8.4*    Liver Function Tests: Recent Labs  Lab 10/29/20 0944 10/30/20 0302 10/31/20 0424 11/02/20 0500  AST 30  --   --   --  ALT 20  --   --   --   ALKPHOS 256*  --   --   --   BILITOT 1.2  --   --   --   PROT 6.7  --   --   --   ALBUMIN 3.3* 2.9* 3.0* 3.1*   No results for input(s): LIPASE, AMYLASE in the last 168 hours. Recent Labs  Lab 10/29/20 1155  AMMONIA 16    CBC: Recent Labs  Lab 10/29/20 0944 10/29/20 1031 10/30/20 1704 10/31/20 0424 10/31/20 1839 11/01/20 0945 11/01/20 1641 11/01/20 1818 11/01/20 1925 11/02/20 0500  WBC 10.4   < > 8.5 8.2 11.5* 10.1  --   --   --  14.2*  NEUTROABS 8.9*  --   --  7.1  --   --   --   --   --  11.2*  HGB 9.2*   < > 7.7* 7.8* 8.4* 8.3* 6.5* 9.5* 9.2* 10.3*  HCT 28.8*   < > 23.0* 23.3* 25.9* 26.4* 19.0* 28.0* 27.0* 29.9*  MCV 96.6   < > 94.3 94.0 95.2 97.4  --   --   --  93.1  PLT 137*   < > 103* 107* 155 141*  --   --   --  206   < > = values in this interval not displayed.    Cardiac Enzymes: No results for input(s): CKTOTAL, CKMB, CKMBINDEX, TROPONINI in the last 168 hours.  BNP: Invalid input(s):  POCBNP  CBG: Recent Labs  Lab 11/01/20 2334 11/02/20 0334 11/02/20 0718  GLUCAP 192* 167* 139*    Microbiology: Results for orders placed or performed during the hospital encounter of 10/29/20  Resp Panel by RT-PCR (Flu A&B, Covid) Nasopharyngeal Swab     Status: None   Collection Time: 10/29/20  9:45 AM   Specimen: Nasopharyngeal Swab; Nasopharyngeal(NP) swabs in vial transport medium  Result Value Ref Range Status   SARS Coronavirus 2 by RT PCR NEGATIVE NEGATIVE Final    Comment: (NOTE) SARS-CoV-2 target nucleic acids are NOT DETECTED.  The SARS-CoV-2 RNA is generally detectable in upper respiratory specimens during the acute phase of infection. The lowest concentration of SARS-CoV-2 viral copies this assay can detect is 138 copies/mL. A negative result does not preclude SARS-Cov-2 infection and should not be used as the sole basis for treatment or other patient management decisions. A negative result may occur with  improper specimen collection/handling, submission of specimen other than nasopharyngeal swab, presence of viral mutation(s) within the areas targeted by this assay, and inadequate number of viral copies(<138 copies/mL). A negative result must be combined with clinical observations, patient history, and epidemiological information. The expected result is Negative.  Fact Sheet for Patients:  EntrepreneurPulse.com.au  Fact Sheet for Healthcare Providers:  IncredibleEmployment.be  This test is no t yet approved or cleared by the [place] FDA and  has been authorized for detection and/or diagnosis of SARS-CoV-2 by FDA under an Emergency Use Authorization (EUA). This EUA will remain  in effect (meaning this test can be used) for the duration of the COVID-19 declaration under Section 564(b)(1) of the Act, 21 U.S.C.section 360bbb-3(b)(1), unless the authorization is terminated  or revoked sooner.       Influenza A by PCR  NEGATIVE NEGATIVE Final   Influenza B by PCR NEGATIVE NEGATIVE Final    Comment: (NOTE) The Xpert Xpress SARS-CoV-2/FLU/RSV plus assay is intended as an aid in the diagnosis of influenza from Nasopharyngeal swab specimens and should  not be used as a sole basis for treatment. Nasal washings and aspirates are unacceptable for Xpert Xpress SARS-CoV-2/FLU/RSV testing.  Fact Sheet for Patients: EntrepreneurPulse.com.au  Fact Sheet for Healthcare Providers: IncredibleEmployment.be  This test is not yet approved or cleared by the [place] FDA and has been authorized for detection and/or diagnosis of SARS-CoV-2 by FDA under an Emergency Use Authorization (EUA). This EUA will remain in effect (meaning this test can be used) for the duration of the COVID-19 declaration under Section 564(b)(1) of the Act, 21 U.S.C. section 360bbb-3(b)(1), unless the authorization is terminated or revoked.  Performed at [facility], [place] [address] [place], [place] [address]   Blood culture (routine x 2)     Status: None (Preliminary result)   Collection Time: 10/29/20 11:07 AM   Specimen: BLOOD  Result Value Ref Range Status   Specimen Description BLOOD SITE NOT SPECIFIED  Final   Special Requests   Final    BOTTLES DRAWN AEROBIC AND ANAEROBIC Blood Culture adequate volume   Culture   Final    NO GROWTH 3 DAYS Performed at [facility], [address], [place], [place] [address]    Report Status PENDING  Incomplete  Blood culture (routine x 2)     Status: None (Preliminary result)   Collection Time: 10/29/20 11:12 AM   Specimen: BLOOD RIGHT ARM  Result Value Ref Range Status   Specimen Description BLOOD RIGHT ARM  Final   Special Requests   Final    BOTTLES DRAWN AEROBIC AND ANAEROBIC Blood Culture results may not be optimal due to an inadequate volume of blood received in culture bottles   Culture   Final    NO GROWTH 3 DAYS Performed at [facility], [place] [address] [place], [place] [address]    Report Status PENDING  Incomplete  Respiratory (~20 pathogens) panel by PCR     Status: None   Collection Time: 10/29/20  3:22 PM   Specimen: Nasopharyngeal Swab; Respiratory  Result Value Ref Range Status   Adenovirus NOT DETECTED NOT DETECTED Final   Coronavirus 229E NOT DETECTED NOT DETECTED Final    Comment: (NOTE) The Coronavirus on the Respiratory Panel, DOES NOT test for the novel  Coronavirus (2019 nCoV)    Coronavirus HKU1 NOT DETECTED NOT DETECTED Final   Coronavirus NL63 NOT DETECTED NOT DETECTED Final   Coronavirus OC43 NOT DETECTED NOT DETECTED Final   Metapneumovirus NOT DETECTED NOT DETECTED Final   Rhinovirus / Enterovirus NOT DETECTED NOT DETECTED Final   Influenza A NOT DETECTED NOT DETECTED Final   Influenza B NOT DETECTED NOT DETECTED Final   Parainfluenza Virus 1 NOT DETECTED NOT DETECTED Final   Parainfluenza Virus 2 NOT DETECTED NOT DETECTED Final   Parainfluenza Virus 3 NOT DETECTED NOT DETECTED Final   Parainfluenza Virus 4 NOT DETECTED NOT DETECTED Final   Respiratory Syncytial Virus NOT DETECTED NOT DETECTED Final   Bordetella pertussis NOT DETECTED NOT DETECTED Final   Bordetella Parapertussis NOT DETECTED NOT DETECTED Final   Chlamydophila pneumoniae NOT DETECTED NOT DETECTED Final   Mycoplasma pneumoniae NOT DETECTED NOT DETECTED Final    Comment: Performed at [facility], [place]. [address] [place], [place] [address]  MRSA PCR Screening     Status: Abnormal   Collection Time: 10/30/20  7:59 PM   Specimen: Nasal Mucosa; Nasopharyngeal  Result Value Ref Range Status   MRSA by PCR (A) NEGATIVE Final    INVALID,  UNABLE TO DETERMINE THE PRESENCE OF TARGET DUE TO SPECIMEN INTEGRITY. RECOLLECTION REQUESTED.    Comment: RESULT CALLED TO, READ BACK BY AND VERIFIED WITHVeva Holes RN 11/01/20 0024 JDW Performed at [facility], [address], [place], [place] [address]   Surgical pcr  screen     Status: None   Collection Time: 11/01/20 12:43 AM   Specimen: Nasal Mucosa; Nasal Swab  Result Value Ref Range Status   MRSA, PCR NEGATIVE NEGATIVE Final   Staphylococcus aureus NEGATIVE NEGATIVE Final    Comment: (NOTE) The Xpert SA Assay (FDA approved for NASAL specimens in patients 48 years of age and older), is one component of a comprehensive surveillance program. It is not intended to diagnose infection nor to guide or monitor treatment. Performed at [facility], [place] [address] [place], [place] [address]     Coagulation Studies: Recent Labs    10/31/20 1839  LABPROT 16.4*  INR 1.4*    Urinalysis: No results for input(s): COLORURINE, LABSPEC, PHURINE, GLUCOSEU, HGBUR, BILIRUBINUR, KETONESUR, PROTEINUR, UROBILINOGEN, NITRITE, LEUKOCYTESUR in the last 72 hours.  Invalid input(s): APPERANCEUR    Imaging: No results found.   Medications:   . sodium chloride 10 mL/hr at 11/02/20 0800  . clevidipine 21 mg/hr (11/02/20 1043)  . levETIRAcetam 500 mg (11/02/20 1008)  . vancomycin     . Chlorhexidine Gluconate Cloth  6 each Topical Q0600  . cinacalcet  180 mg Oral Q M,W,F  . [START ON 11/05/2020] darbepoetin (ARANESP) injection - DIALYSIS  100 mcg Intravenous Q Mon-HD  . dexamethasone (DECADRON) injection  4 mg Intravenous Q8H  . docusate sodium  100 mg Oral BID  . doxercalciferol  5 mcg Oral Once per day on Mon Wed Fri  . lidocaine-prilocaine   Topical Q M,W,F-HD  . pantoprazole  40 mg Oral Daily  . sucroferric oxyhydroxide  1,000 mg Oral TID WC   acetaminophen **OR** acetaminophen, enalaprilat, HYDROmorphone (DILAUDID) injection, labetalol, ondansetron **OR** ondansetron (ZOFRAN) IV, promethazine  Assessment/ Plan:  MonWedFri, 4 hrs 0 min, 180NRe Optiflux, BFR 400, DFR Manual 500 mL/min, EDW 73.5 (kg), Dialysate 2.0 K, 2.0 Ca, Sodium 137 (mEq/L), Bicarb Setting: 35 (mEq/L), Access: AVFistula Sensipar 180MWF, hecterol 5MWF, iron 50 weekly, mircera  75 q2 wks(due April 4)  1. ESRD: MWF.    [place].  Status post dialysis 10/31/2020.  4 L removed.  Plan CRRT to start 11/02/2020 no heparin.  Keep even on dialysis.  Low flow rates.  Avoiding osmotic shifts 2. Volume/ hypertension: Underwent ultrafiltration of 4 L 3.Anemia of Chronic Kidney Disease:  Darbepoetin ordered 4. Secondary Hyperparathyroidism/Hyperphosphatemia:Continue home sensipar 180mg  and hecterol 58mcg w/ dialysis. On velphoro 1000 w/ meals. 5.Vascular access:LUE AVF w/ thrill and pulsatility. Prolonged bleeding on admission now resolved 6.AMS/Intracranial mass status post excision of meningioma 11/01/2020.  We will try to minimize the effect of osmotic shifts with dialysis by proceeding with slow continuous dialysis 7. PNA?: concern on CXR. Possible aspiration given AMS but feel this more likely represents pulmonary edema. Defer antibiotics to primary team    LOS: [place] @TODAY @11 :14 AM

## 2020-11-02 NOTE — Progress Notes (Signed)
Patient had very sudden extreme agitation.  She ripped off her leads and o2 probe and demanded to leave the hospital.  I gave her the final dose of ativan that Dr. Marcello Moores had ordered this morning.  I contact Dr. Tacy Learn and he came and assessed the patient and then ordered a precedex infusion.

## 2020-11-02 NOTE — Progress Notes (Signed)
Subjective: Patient reports of moderate headache, complains that nursing was not responsive overnight.   Objective: Vital signs in last 24 hours: Temp:  [97.9 F (36.6 C)-98.7 F (37.1 C)] 98.7 F (37.1 C) (04/01 0400) Pulse Rate:  [70-96] 96 (04/01 1103) Resp:  [13-25] 17 (04/01 1103) BP: (113-201)/(63-169) 132/69 (04/01 1100) SpO2:  [82 %-100 %] 97 % (04/01 1103) Arterial Line BP: (110-215)/(51-86) 174/77 (04/01 1103)  Intake/Output from previous day: 03/31 0701 - 04/01 0700 In: 2557.3 [I.V.:1559.3; Blood:898; IV Piggyback:100] Out: 53 [Blood:300] Intake/Output this shift: Total I/O In: 52 [I.V.:52] Out: 160 [Drains:160]  NAD Alert, oriented to person, place, not date.  Impulsive, inattentive. FC x 4, no drift Dressing c/d   Lab Results: Recent Labs    11/01/20 0945 11/01/20 1641 11/01/20 1925 11/02/20 0500  WBC 10.1  --   --  14.2*  HGB 8.3*   < > 9.2* 10.3*  HCT 26.4*   < > 27.0* 29.9*  PLT 141*  --   --  206   < > = values in this interval not displayed.   BMET Recent Labs    11/01/20 0344 11/01/20 1641 11/01/20 1818 11/01/20 1925 11/02/20 0500  NA 136   < > 134* 136 136  K 5.2*   < > 5.7* 5.7* 5.4*  CL 97*   < > 102  --  99  CO2 28  --   --   --  19*  GLUCOSE 139*   < > 139*  --  149*  BUN 44*   < > 53*  --  72*  CREATININE 6.69*   < > 8.10*  --  8.94*  CALCIUM 7.5*  --   --   --  7.6*   < > = values in this interval not displayed.    Studies/Results: No results found.  Assessment/Plan: 55 yo F with ESRD s/p bifrontal craniotomy for large olfactory groove meningioma. She has increased impulsivity postoperatively, which is expected. - cont dexamethasone - likely CRRT today given recent CNS surgery - MRI today - Ativan for anxiety - switched to Dilaudid for pain   [name] 11/02/2020, 11:43 AM

## 2020-11-03 DIAGNOSIS — G9389 Other specified disorders of brain: Secondary | ICD-10-CM | POA: Diagnosis not present

## 2020-11-03 DIAGNOSIS — R410 Disorientation, unspecified: Secondary | ICD-10-CM | POA: Diagnosis not present

## 2020-11-03 DIAGNOSIS — N186 End stage renal disease: Secondary | ICD-10-CM | POA: Diagnosis not present

## 2020-11-03 LAB — CULTURE, BLOOD (ROUTINE X 2)
Culture: NO GROWTH
Culture: NO GROWTH
Special Requests: ADEQUATE

## 2020-11-03 LAB — GLUCOSE, CAPILLARY
Glucose-Capillary: 120 mg/dL — ABNORMAL HIGH (ref 70–99)
Glucose-Capillary: 126 mg/dL — ABNORMAL HIGH (ref 70–99)
Glucose-Capillary: 133 mg/dL — ABNORMAL HIGH (ref 70–99)
Glucose-Capillary: 135 mg/dL — ABNORMAL HIGH (ref 70–99)
Glucose-Capillary: 147 mg/dL — ABNORMAL HIGH (ref 70–99)
Glucose-Capillary: 152 mg/dL — ABNORMAL HIGH (ref 70–99)

## 2020-11-03 LAB — RENAL FUNCTION PANEL
Albumin: 2.7 g/dL — ABNORMAL LOW (ref 3.5–5.0)
Albumin: 2.5 g/dL — ABNORMAL LOW (ref 3.5–5.0)
Anion gap: 10 (ref 5–15)
Anion gap: 10 (ref 5–15)
BUN: 43 mg/dL — ABNORMAL HIGH (ref 6–20)
BUN: 31 mg/dL — ABNORMAL HIGH (ref 6–20)
CO2: 24 mmol/L (ref 22–32)
CO2: 22 mmol/L (ref 22–32)
Calcium: 7.5 mg/dL — ABNORMAL LOW (ref 8.9–10.3)
Calcium: 7.9 mg/dL — ABNORMAL LOW (ref 8.9–10.3)
Chloride: 100 mmol/L (ref 98–111)
Chloride: 104 mmol/L (ref 98–111)
Creatinine, Ser: 5.05 mg/dL — ABNORMAL HIGH (ref 0.44–1.00)
Creatinine, Ser: 3.65 mg/dL — ABNORMAL HIGH (ref 0.44–1.00)
GFR, Estimated: 10 mL/min — ABNORMAL LOW (ref >60)
GFR, Estimated: 14 mL/min — ABNORMAL LOW (ref >60)
Glucose, Bld: 147 mg/dL — ABNORMAL HIGH (ref 70–99)
Glucose, Bld: 126 mg/dL — ABNORMAL HIGH (ref 70–99)
Phosphorus: 6.7 mg/dL — ABNORMAL HIGH (ref 2.5–4.6)
Phosphorus: 5.9 mg/dL — ABNORMAL HIGH (ref 2.5–4.6)
Potassium: 5.1 mmol/L (ref 3.5–5.1)
Potassium: 5 mmol/L (ref 3.5–5.1)
Sodium: 134 mmol/L — ABNORMAL LOW (ref 135–145)
Sodium: 136 mmol/L (ref 135–145)

## 2020-11-03 LAB — TRIGLYCERIDES: Triglycerides: 122 mg/dL (ref <150)

## 2020-11-03 LAB — MAGNESIUM: Magnesium: 2.6 mg/dL — ABNORMAL HIGH (ref 1.7–2.4)

## 2020-11-03 NOTE — Progress Notes (Signed)
NAME:  Ruth Gutierrez, MRN:  [id number], DOB:  1966-03-12, LOS: 5 ADMISSION DATE:  10/29/2020, CONSULTATION DATE:  11/01/2020 REFERRING MD:  Dr. Marcello Moores, CHIEF COMPLAINT:  Intracranial mass  History of Present Illness:  55 year old female with prior history of anuric ESRD (MWF, LUE AVF) s/p failed renal transplant 2010/ removed 2017, HTN, and anemia of chronic disease who presented on 3/28 after becoming lethargic at [facility].  Patient had reported SOB with productive clear cough, no fever for several days and progressive generalized weakness for several weeks.  Also reported intermittent severe frontal headaches for several months. No dialysis sessions missed.  Her workup showed CXR concerning for bibasilar pna vs pulmonary edema noted to be hypertensive 244/101 initially and hypoxic 89% on room air.  She was started on cefepime and vanc but developed itching and emesis with vanc, therefore stopped.  CTH showed significant large mass on cribriform plate c/w meningioma w/peritumoral cyst and with marked vasogenic edema.  She was started on dexamethasone and neurosurgery consulted. Nephrology consulted given ESRD. MRI confirmed large meningioma.  Since, admission, her mental status and dyspnea improved with decadron and dialysis.  She went 3/31 for resection by Dr. Marcello Moores.  She was extubated in PACU.  PCCM consulted for further medical management as patient to return to ICU post-operatively.   Pertinent  Medical History  Anuric ESRD (MWF, LUE AVF) s/p failed renal transplant 2010/ removed 2017, HTN, anemia of chronic disease  Tobacco abuse  Significant Hospital Events: Including procedures, antibiotic start and stop dates in addition to other pertinent events   3/28 >>> admitted to IMTS with AMS 2/2 intracranial mass.  NSGY and nephrology consulted.  Started on decadron.  Also being treated for possible HCAP- cefepime/ vanc but developed rxn to vanc.  Noted to be severely HTN.  MRI confirmed large  meningioma of the anterior cranial fossa causing severe vasogenic edema throughout both frontal lobes, left worse than right. 3/29 iHD- 2L off , feeling better, less SOB, improving mental status  3/30 iHD- 2L off  3/31 underwent resection of meningioma, returns to ICU postoperatively, extubated in PACU, MAP goal 70-90, R radial aline  Interim History / Subjective:  Patient gets intermittently agitated, on Precedex infusion at 1.2 mics Right side of the face is swollen, started on CRRT since yesterday  Objective   Blood pressure (!) 119/95, pulse (!) 48, temperature 97.6 F (36.4 C), temperature source Oral, resp. rate 14, weight 75.5 kg, last menstrual period 06/02/2015, SpO2 96 %.      Aline reading 218/93 (140)  Intake/Output Summary (Last 24 hours) at 11/03/2020 1121 Last data filed at 11/03/2020 1100 Gross per 24 hour  Intake 1728.76 ml  Output 865 ml  Net 863.76 ml   Filed Weights   10/31/20 0021 10/31/20 1426 10/31/20 1710  Weight: 76.7 kg 77.7 kg 75.5 kg    Examination: General:  Adult African-American female, lying in the bed HEENT: MM pale/moist, frontal dressing intact/ dry with hemovac in place, pupils 3/reactive.  Right side of face/eyelid is swollen Neuro: Lethargic but opens eyes with vocal stimuli, following simple commands, antigravity in all 4 extremities. CV: Regular rate and rhythm, no murmur, LUE AVF PULM:  Non labored, coarse breath sounds, strong cough GI: Soft, nondistended bowel sounds present Extremities: warm/dry, no LE edema  Skin: no rashes   Labs/imaging that I havepersonally reviewed  (right click and "Reselect all SmartList Selections" daily)  3/28 CTH > large mass on cribriform plate c/w meningioma w/  peritumoral cyst with marked vasogenic edema and elevated ICP.   3/28 MRI brain >> 4.2 x 3.7 x 2.8 cm meningioma of the anterior cranial fossa causing severe vasogenic edema throughout both frontal lobes, left worse than right.  3/29 TTE LVEF  60-65%, moderate LVH, G1DD, normal RV, mildly elevated PASP, moderate TR, trivial MR  Resolved Hospital Problem list    Assessment & Plan:   Large anterior skull base tumor, likely meningioma per MRI with significant bifrontal edema s/p resection 3/31 Management per neurosurgery Continue Decadron 4 mg every 8 hours Continue neuro check every hour Map goal 70-90 Continue as needed pain meds Avoid NSAIDs Continue zofran prn nausea, hold for QTc > 500 Continue keppra for seizure ppx per NSGY follow surgical pathology MRI brain pending  Acute hyperactive delirium Due to frontal tumor and steroid therapy On Precedex infusion, will try to taper it off  Acute hypoxic respiratory failure Likely due to volume overload Currently on 2 L oxygen via nasal cannula  HTN MAP parameters as above, goal 70-90, continue as needed labetalol  ESRD on HD Currently on CRRT per nephrology  Secondary hyperparathyroidism, hyperphosphatemia Monitor phosphorus Continue sensipar and hecerol per Nephrology  Anemia of chronic disease with ABLA Patient noted to have bleeding from AVF on admit s/p 1 unit PRBC 3/31 for Hgb 6.5-> 9.2 Monitor H&H, transfuse if less than 7 mircera q 2 weeks/ iron weekly  Tobacco abuse Counseling provided regarding quit smoking  Best practice (right click and "Reselect all SmartList Selections" daily)  Diet:  NPO Pain/Anxiety/Delirium protocol (if indicated): Yes on Precedex VAP protocol (if indicated): Not indicated DVT prophylaxis: SCD GI prophylaxis: PPI Glucose control:  SSI No, consider if > 180 Central venous access:  N/A Arterial line:  Yes, and it is still needed Foley:  N/A Mobility:  bed rest  PT consulted: N/A Last date of multidisciplinary goals of care discussion [per primary] Code Status:  full code Disposition: Neuro ICU  Labs   CBC: Recent Labs  Lab 10/29/20 0944 10/29/20 1031 10/30/20 1704 10/31/20 0424 10/31/20 1839 11/01/20 0945  11/01/20 1641 11/01/20 1818 11/01/20 1925 11/02/20 0500  WBC 10.4   < > 8.5 8.2 11.5* 10.1  --   --   --  14.2*  NEUTROABS 8.9*  --   --  7.1  --   --   --   --   --  11.2*  HGB 9.2*   < > 7.7* 7.8* 8.4* 8.3* 6.5* 9.5* 9.2* 10.3*  HCT 28.8*   < > 23.0* 23.3* 25.9* 26.4* 19.0* 28.0* 27.0* 29.9*  MCV 96.6   < > 94.3 94.0 95.2 97.4  --   --   --  93.1  PLT 137*   < > 103* 107* 155 141*  --   --   --  206   < > = values in this interval not displayed.    Basic Metabolic Panel: Recent Labs  Lab 10/29/20 1524 10/30/20 0302 10/31/20 0424 10/31/20 0932 10/31/20 1839 11/01/20 0344 11/01/20 1641 11/01/20 1818 11/01/20 1925 11/02/20 0500 11/02/20 1800 11/03/20 0500  NA  --  138 135  --  137 136 136 134* 136 136 135 134*  K  --  4.9 4.7  --  4.5 5.2* 4.9 5.7* 5.7* 5.4* 5.4* 5.1  CL  --  94* 96*  --  97* 97* 103 102  --  99 98 100  CO2  --  29 26  --  29 28  --   --   --  19* 23 24  GLUCOSE  --  184* 126*  --  146* 139* 122* 139*  --  149* 148* 147*  BUN  --  62* 43*  --  28* 44* 50* 53*  --  72* 72* 43*  CREATININE  --  10.63* 7.72*  --  5.06* 6.69* 7.40* 8.10*  --  8.94* 8.83* 5.05*  CALCIUM  --  8.7* 9.7  --  8.5* 7.5*  --   --   --  7.6* 7.4* 7.5*  MG 2.7*  --   --  2.5*  --   --   --   --   --   --   --  2.6*  PHOS  --  7.6* 7.2*  --   --   --   --   --   --  8.4* 8.7* 6.7*   GFR: CrCl cannot be calculated (Unknown ideal weight.). Recent Labs  Lab 10/29/20 0944 10/29/20 1502 10/29/20 1522 10/30/20 0302 10/31/20 0424 10/31/20 1839 11/01/20 0945 11/02/20 0500  PROCALCITON  --  0.79  --   --   --   --   --   --   WBC 10.4  --   --    < > 8.2 11.5* 10.1 14.2*  LATICACIDVEN 2.4*  --  1.1  --   --   --   --   --    < > = values in this interval not displayed.    Liver Function Tests: Recent Labs  Lab 10/29/20 0944 10/30/20 0302 10/31/20 0424 11/02/20 0500 11/02/20 1800 11/03/20 0500  AST 30  --   --   --   --   --   ALT 20  --   --   --   --   --   ALKPHOS 256*   --   --   --   --   --   BILITOT 1.2  --   --   --   --   --   PROT 6.7  --   --   --   --   --   ALBUMIN 3.3* 2.9* 3.0* 3.1* 2.8* 2.7*   No results for input(s): LIPASE, AMYLASE in the last 168 hours. Recent Labs  Lab 10/29/20 1155  AMMONIA 16    ABG    Component Value Date/Time   PHART 7.437 11/01/2020 1925   PCO2ART 33.9 11/01/2020 1925   PO2ART 102 11/01/2020 1925   HCO3 22.9 11/01/2020 1925   TCO2 24 11/01/2020 1925   ACIDBASEDEF 1.0 11/01/2020 1925   O2SAT 98.0 11/01/2020 1925     Total critical care time: 40 minutes  Performed by: [place] care time was exclusive of separately billable procedures and treating other patients.   Critical care was necessary to treat or prevent imminent or life-threatening deterioration.   Critical care was time spent personally by me on the following activities: development of treatment plan with patient and/or surrogate as well as nursing, discussions with consultants, evaluation of patient's response to treatment, examination of patient, obtaining history from patient or surrogate, ordering and performing treatments and interventions, ordering and review of laboratory studies, ordering and review of radiographic studies, pulse oximetry and re-evaluation of patient's condition.   Jacky Kindle MD [facility] See Amion for pager If no response to pager, please call [phone] until 7pm After 7pm, Please call E-link [phone]

## 2020-11-03 NOTE — Progress Notes (Signed)
She is fairly lethargic this morning.  Her right side of her face is swollen, she is on hemodialysis at present.  Dressing is dry.  Overall appears to be stable.

## 2020-11-03 NOTE — Progress Notes (Signed)
[facility] ROUNDING NOTE   Subjective:   History: 55 year old lady end-stage renal disease status post failed transplant 2010 history of hypertension altered mental status admitted for meningioma with significant vasogenic edema.  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  Appreciate assistance of Dr. Marcello Moores.  She received a dialysis treatment in screens per kidney center on a Monday Wednesday Friday schedule.  CRRT started 11/02/2020 due to large tumor with edema and concerns of osmotic shifts.  She seems to be tolerating CRRT well with no anticoagulation  Blood pressure 124/64 pulse 50 temperature 97 O2 sats 90% room air  Sodium 134 potassium 5.1 chloride 100 CO2 24 BUN 43 creatinine 5.0 glucose 147 calcium 7.5 phosphorus 6.7 albumin 2.7 hemoglobin 10.3  Decadron every 8 hours, Hectorol 5 mcg Monday Wednesday Friday Aranesp 100 mcg weekly   Objective:  Vital signs in last 24 hours:  Temp:  [97.6 F (36.4 C)-98.1 F (36.7 C)] 97.6 F (36.4 C) (04/02 0000) Pulse Rate:  [43-96] 51 (04/02 0900) Resp:  [11-21] 20 (04/02 0900) BP: (92-150)/(57-95) 119/95 (04/02 0200) SpO2:  [82 %-98 %] 96 % (04/02 0900) Arterial Line BP: (96-174)/(50-91) 133/67 (04/02 0900)  Weight change:  Filed Weights   10/31/20 0021 10/31/20 1426 10/31/20 1710  Weight: 76.7 kg 77.7 kg 75.5 kg    Intake/Output: I/O last 3 completed shifts: In: 3470.5 [I.V.:2752.5; Blood:268; IV Piggyback:450] Out: 355 [Drains:160; Other:630; Blood:50]   Intake/Output this shift:  Total I/O In: 67.7 [I.V.:67.7] Out: 83 [Other:83]  Constitutional: well-appearing, no acute distress ENMT: ears and nose without scars or lesions, MMM CV: normal rate, no edema Respiratory: clear to auscultation, normal work of breathing Gastrointestinal: soft, non-tender, no palpable masses or hernias Skin: no visible lesions or rashes Psych: Awake, alert, answers most questions  appropriately   Basic Metabolic Panel: Recent Labs  Lab 10/29/20 1524 10/30/20 0302 10/31/20 0424 10/31/20 0932 10/31/20 1839 11/01/20 0344 11/01/20 1641 11/01/20 1818 11/01/20 1925 11/02/20 0500 11/02/20 1800 11/03/20 0500  NA  --  138 135  --  137 136 136 134* 136 136 135 134*  K  --  4.9 4.7  --  4.5 5.2* 4.9 5.7* 5.7* 5.4* 5.4* 5.1  CL  --  94* 96*  --  97* 97* 103 102  --  99 98 100  CO2  --  29 26  --  29 28  --   --   --  19* 23 24  GLUCOSE  --  184* 126*  --  146* 139* 122* 139*  --  149* 148* 147*  BUN  --  62* 43*  --  28* 44* 50* 53*  --  72* 72* 43*  CREATININE  --  10.63* 7.72*  --  5.06* 6.69* 7.40* 8.10*  --  8.94* 8.83* 5.05*  CALCIUM  --  8.7* 9.7  --  8.5* 7.5*  --   --   --  7.6* 7.4* 7.5*  MG 2.7*  --   --  2.5*  --   --   --   --   --   --   --  2.6*  PHOS  --  7.6* 7.2*  --   --   --   --   --   --  8.4* 8.7* 6.7*    Liver Function Tests: Recent Labs  Lab 10/29/20 0944 10/30/20 0302 10/31/20 0424 11/02/20 0500 11/02/20 1800 11/03/20 0500  AST 30  --   --   --   --   --  ALT 20  --   --   --   --   --   ALKPHOS 256*  --   --   --   --   --   BILITOT 1.2  --   --   --   --   --   PROT 6.7  --   --   --   --   --   ALBUMIN 3.3* 2.9* 3.0* 3.1* 2.8* 2.7*   No results for input(s): LIPASE, AMYLASE in the last 168 hours. Recent Labs  Lab 10/29/20 1155  AMMONIA 16    CBC: Recent Labs  Lab 10/29/20 0944 10/29/20 1031 10/30/20 1704 10/31/20 0424 10/31/20 1839 11/01/20 0945 11/01/20 1641 11/01/20 1818 11/01/20 1925 11/02/20 0500  WBC 10.4   < > 8.5 8.2 11.5* 10.1  --   --   --  14.2*  NEUTROABS 8.9*  --   --  7.1  --   --   --   --   --  11.2*  HGB 9.2*   < > 7.7* 7.8* 8.4* 8.3* 6.5* 9.5* 9.2* 10.3*  HCT 28.8*   < > 23.0* 23.3* 25.9* 26.4* 19.0* 28.0* 27.0* 29.9*  MCV 96.6   < > 94.3 94.0 95.2 97.4  --   --   --  93.1  PLT 137*   < > 103* 107* 155 141*  --   --   --  206   < > = values in this interval not displayed.    Cardiac  Enzymes: No results for input(s): CKTOTAL, CKMB, CKMBINDEX, TROPONINI in the last 168 hours.  BNP: Invalid input(s): POCBNP  CBG: Recent Labs  Lab 11/02/20 1559 11/02/20 1918 11/02/20 2314 11/03/20 0230 11/03/20 0817  GLUCAP 133* 146* 133* 147* 120*    Microbiology: Results for orders placed or performed during the hospital encounter of 10/29/20  Resp Panel by RT-PCR (Flu A&B, Covid) Nasopharyngeal Swab     Status: None   Collection Time: 10/29/20  9:45 AM   Specimen: Nasopharyngeal Swab; Nasopharyngeal(NP) swabs in vial transport medium  Result Value Ref Range Status   SARS Coronavirus 2 by RT PCR NEGATIVE NEGATIVE Final    Comment: (NOTE) SARS-CoV-2 target nucleic acids are NOT DETECTED.  The SARS-CoV-2 RNA is generally detectable in upper respiratory specimens during the acute phase of infection. The lowest concentration of SARS-CoV-2 viral copies this assay can detect is 138 copies/mL. A negative result does not preclude SARS-Cov-2 infection and should not be used as the sole basis for treatment or other patient management decisions. A negative result may occur with  improper specimen collection/handling, submission of specimen other than nasopharyngeal swab, presence of viral mutation(s) within the areas targeted by this assay, and inadequate number of viral copies(<138 copies/mL). A negative result must be combined with clinical observations, patient history, and epidemiological information. The expected result is Negative.  Fact Sheet for Patients:  EntrepreneurPulse.com.au  Fact Sheet for Healthcare Providers:  IncredibleEmployment.be  This test is no t yet approved or cleared by the [place] FDA and  has been authorized for detection and/or diagnosis of SARS-CoV-2 by FDA under an Emergency Use Authorization (EUA). This EUA will remain  in effect (meaning this test can be used) for the duration of the COVID-19  declaration under Section 564(b)(1) of the Act, 21 U.S.C.section 360bbb-3(b)(1), unless the authorization is terminated  or revoked sooner.       Influenza A by PCR NEGATIVE NEGATIVE Final   Influenza B  by PCR NEGATIVE NEGATIVE Final    Comment: (NOTE) The Xpert Xpress SARS-CoV-2/FLU/RSV plus assay is intended as an aid in the diagnosis of influenza from Nasopharyngeal swab specimens and should not be used as a sole basis for treatment. Nasal washings and aspirates are unacceptable for Xpert Xpress SARS-CoV-2/FLU/RSV testing.  Fact Sheet for Patients: EntrepreneurPulse.com.au  Fact Sheet for Healthcare Providers: IncredibleEmployment.be  This test is not yet approved or cleared by the [place] FDA and has been authorized for detection and/or diagnosis of SARS-CoV-2 by FDA under an Emergency Use Authorization (EUA). This EUA will remain in effect (meaning this test can be used) for the duration of the COVID-19 declaration under Section 564(b)(1) of the Act, 21 U.S.C. section 360bbb-3(b)(1), unless the authorization is terminated or revoked.  Performed at [facility], [place] [address] [place], [place] [address]   Blood culture (routine x 2)     Status: None (Preliminary result)   Collection Time: 10/29/20 11:07 AM   Specimen: BLOOD  Result Value Ref Range Status   Specimen Description BLOOD SITE NOT SPECIFIED  Final   Special Requests   Final    BOTTLES DRAWN AEROBIC AND ANAEROBIC Blood Culture adequate volume   Culture   Final    NO GROWTH 4 DAYS Performed at [facility], [place] [address] [place], [place] [address]    Report Status PENDING  Incomplete  Blood culture (routine x 2)     Status: None (Preliminary result)   Collection Time: 10/29/20 11:12 AM   Specimen: BLOOD RIGHT ARM  Result Value Ref Range Status   Specimen Description BLOOD RIGHT ARM  Final   Special Requests   Final    BOTTLES DRAWN AEROBIC AND  ANAEROBIC Blood Culture results may not be optimal due to an inadequate volume of blood received in culture bottles   Culture   Final    NO GROWTH 4 DAYS Performed at [facility], [place] [address], [place], [place] [address]    Report Status PENDING  Incomplete  Respiratory (~20 pathogens) panel by PCR     Status: None   Collection Time: 10/29/20  3:22 PM   Specimen: Nasopharyngeal Swab; Respiratory  Result Value Ref Range Status   Adenovirus NOT DETECTED NOT DETECTED Final   Coronavirus 229E NOT DETECTED NOT DETECTED Final    Comment: (NOTE) The Coronavirus on the Respiratory Panel, DOES NOT test for the novel  Coronavirus (2019 nCoV)    Coronavirus HKU1 NOT DETECTED NOT DETECTED Final   Coronavirus NL63 NOT DETECTED NOT DETECTED Final   Coronavirus OC43 NOT DETECTED NOT DETECTED Final   Metapneumovirus NOT DETECTED NOT DETECTED Final   Rhinovirus / Enterovirus NOT DETECTED NOT DETECTED Final   Influenza A NOT DETECTED NOT DETECTED Final   Influenza B NOT DETECTED NOT DETECTED Final   Parainfluenza Virus 1 NOT DETECTED NOT DETECTED Final   Parainfluenza Virus 2 NOT DETECTED NOT DETECTED Final   Parainfluenza Virus 3 NOT DETECTED NOT DETECTED Final   Parainfluenza Virus 4 NOT DETECTED NOT DETECTED Final   Respiratory Syncytial Virus NOT DETECTED NOT DETECTED Final   Bordetella pertussis NOT DETECTED NOT DETECTED Final   Bordetella Parapertussis NOT DETECTED NOT DETECTED Final   Chlamydophila pneumoniae NOT DETECTED NOT DETECTED Final   Mycoplasma pneumoniae NOT DETECTED NOT DETECTED Final    Comment: Performed at [facility], [place] [address], [place], [place] [address]  MRSA PCR Screening     Status: Abnormal  Collection Time: 10/30/20  7:59 PM   Specimen: Nasal Mucosa; Nasopharyngeal  Result Value Ref Range Status   MRSA by PCR (A) NEGATIVE Final    INVALID, UNABLE TO DETERMINE THE PRESENCE OF TARGET DUE TO SPECIMEN INTEGRITY. RECOLLECTION REQUESTED.    Comment:  RESULT CALLED TO, READ BACK BY AND VERIFIED WITHVeva Holes RN 11/01/20 0024 JDW Performed at [facility], [address] [place], [place] [address]   Surgical pcr screen     Status: None   Collection Time: 11/01/20 12:43 AM   Specimen: Nasal Mucosa; Nasal Swab  Result Value Ref Range Status   MRSA, PCR NEGATIVE NEGATIVE Final   Staphylococcus aureus NEGATIVE NEGATIVE Final    Comment: (NOTE) The Xpert SA Assay (FDA approved for NASAL specimens in patients 15 years of age and older), is one component of a comprehensive surveillance program. It is not intended to diagnose infection nor to guide or monitor treatment. Performed at [facility], [place] [address], [place], [place] [address]     Coagulation Studies: Recent Labs    10/31/20 1839  LABPROT 16.4*  INR 1.4*    Urinalysis: No results for input(s): COLORURINE, LABSPEC, PHURINE, GLUCOSEU, HGBUR, BILIRUBINUR, KETONESUR, PROTEINUR, UROBILINOGEN, NITRITE, LEUKOCYTESUR in the last 72 hours.  Invalid input(s): APPERANCEUR    Imaging: DG Chest Port 1 View  Result Date: 11/02/2020 CLINICAL DATA:  Central catheter placement EXAM: PORTABLE CHEST 1 VIEW COMPARISON:  October 31, 2020 FINDINGS: Central catheter tip is in the superior vena cava near the cavoatrial junction. No pneumothorax. There is interstitial pulmonary edema, similar to 2 days prior. No consolidation. There is cardiomegaly with mild pulmonary venous hypertension. No adenopathy. No bone lesions. IMPRESSION: Central catheter tip in superior vena cava near cavoatrial junction. No pneumothorax. Persistent cardiomegaly with a degree of pulmonary vascular congestion and interstitial pulmonary edema. No consolidation. Stable cardiac silhouette. Electronically Signed   By: Lowella Grip III M.D.   On: 11/02/2020 15:11     Medications:   .  prismasol BGK 4/2.5 200 mL/hr at 11/02/20 1817  .  prismasol BGK 4/2.5 200 mL/hr at 11/02/20 1818  . sodium chloride     . sodium chloride    . sodium chloride 10 mL/hr at 11/03/20 0900  . clevidipine 2 mg/hr (11/03/20 0900)  . dexmedetomidine (PRECEDEX) IV infusion 1 mcg/kg/hr (11/03/20 0900)  . levETIRAcetam 500 mg (11/03/20 0949)  . prismasol BGK 4/2.5 1,000 mL/hr at 11/03/20 0947  . vancomycin Stopped (11/02/20 1707)   . Chlorhexidine Gluconate Cloth  6 each Topical Q0600  . cinacalcet  180 mg Oral Q M,W,F  . [START ON 11/05/2020] darbepoetin (ARANESP) injection - DIALYSIS  100 mcg Intravenous Q Mon-HD  . dexamethasone (DECADRON) injection  4 mg Intravenous Q8H  . docusate sodium  100 mg Oral BID  . doxercalciferol  5 mcg Oral Once per day on Mon Wed Fri  . lidocaine-prilocaine   Topical Q M,W,F-HD  . pantoprazole  40 mg Oral Daily  . sodium chloride flush  10-40 mL Intracatheter Q12H  . sucroferric oxyhydroxide  1,000 mg Oral TID WC   sodium chloride, sodium chloride, acetaminophen **OR** acetaminophen, alteplase, enalaprilat, heparin, heparin, HYDROmorphone (DILAUDID) injection, labetalol, lidocaine (PF), lidocaine-prilocaine, ondansetron **OR** ondansetron (ZOFRAN) IV, pentafluoroprop-tetrafluoroeth, promethazine, sodium chloride, sodium chloride flush  Assessment/ Plan:  MonWedFri, 4 hrs 0 min, 180NRe Optiflux, BFR 400, DFR Manual 500 mL/min, EDW 73.5 (kg), Dialysate 2.0 K, 2.0 Ca, Sodium 137 (mEq/L), Bicarb Setting: 35 (mEq/L), Access:  AVFistula Sensipar 180MWF, hecterol 5MWF, iron 50 weekly, mircera 75 q2 wks(due April 61)  55 year old lady status post resection of meningioma 11/01/2020  1. ESRD: MWF.    [place].  Started CRRT 11/02/2020.  Keep even on dialysis.  Low flow rates.  Avoiding osmotic shifts 2. Volume/ hypertension: Underwent ultrafiltration of 4 L 3.Anemia of Chronic Kidney Disease:  Darbepoetin ordered 4. Secondary Hyperparathyroidism/Hyperphosphatemia:Continue home sensipar 180mg  and hecterol 83mcg w/ dialysis. On velphoro 1000 w/ meals. 5.Vascular access:LUE AVF w/ thrill and  pulsatility. Prolonged bleeding on admission now resolved 6.AMS/Intracranial mass status post excision of meningioma 11/01/2020.  We will try to minimize the effect of osmotic shifts with dialysis by proceeding with slow continuous dialysis     LOS: 5 Sherril Croon @TODAY @9 :59 AM

## 2020-11-04 ENCOUNTER — Inpatient Hospital Stay (HOSPITAL_COMMUNITY): Payer: Medicare Other

## 2020-11-04 DIAGNOSIS — G9340 Encephalopathy, unspecified: Secondary | ICD-10-CM | POA: Diagnosis not present

## 2020-11-04 DIAGNOSIS — G9389 Other specified disorders of brain: Secondary | ICD-10-CM | POA: Diagnosis not present

## 2020-11-04 LAB — TYPE AND SCREEN
ABO/RH(D): A POS
Antibody Screen: NEGATIVE
Unit division: 0
Unit division: 0
Unit division: 0
Unit division: 0

## 2020-11-04 LAB — RENAL FUNCTION PANEL
Albumin: 2.5 g/dL — ABNORMAL LOW (ref 3.5–5.0)
Albumin: 2.4 g/dL — ABNORMAL LOW (ref 3.5–5.0)
Anion gap: 7 (ref 5–15)
Anion gap: 7 (ref 5–15)
BUN: 25 mg/dL — ABNORMAL HIGH (ref 6–20)
BUN: 23 mg/dL — ABNORMAL HIGH (ref 6–20)
CO2: 24 mmol/L (ref 22–32)
CO2: 24 mmol/L (ref 22–32)
Calcium: 7.6 mg/dL — ABNORMAL LOW (ref 8.9–10.3)
Calcium: 7 mg/dL — ABNORMAL LOW (ref 8.9–10.3)
Chloride: 104 mmol/L (ref 98–111)
Chloride: 104 mmol/L (ref 98–111)
Creatinine, Ser: 2.94 mg/dL — ABNORMAL HIGH (ref 0.44–1.00)
Creatinine, Ser: 2.96 mg/dL — ABNORMAL HIGH (ref 0.44–1.00)
GFR, Estimated: 18 mL/min — ABNORMAL LOW (ref >60)
GFR, Estimated: 18 mL/min — ABNORMAL LOW (ref >60)
Glucose, Bld: 115 mg/dL — ABNORMAL HIGH (ref 70–99)
Glucose, Bld: 247 mg/dL — ABNORMAL HIGH (ref 70–99)
Phosphorus: 4.8 mg/dL — ABNORMAL HIGH (ref 2.5–4.6)
Phosphorus: 3.7 mg/dL (ref 2.5–4.6)
Potassium: 5.1 mmol/L (ref 3.5–5.1)
Potassium: 4.7 mmol/L (ref 3.5–5.1)
Sodium: 135 mmol/L (ref 135–145)
Sodium: 135 mmol/L (ref 135–145)

## 2020-11-04 LAB — BPAM RBC
Blood Product Expiration Date: 202204262359
Blood Product Expiration Date: 202204262359
Blood Product Expiration Date: 202204262359
Blood Product Expiration Date: 202204262359
ISSUE DATE / TIME: 202203311312
ISSUE DATE / TIME: 202203311312
Unit Type and Rh: 6200
Unit Type and Rh: 6200
Unit Type and Rh: 6200
Unit Type and Rh: 6200

## 2020-11-04 LAB — GLUCOSE, CAPILLARY
Glucose-Capillary: 96 mg/dL (ref 70–99)
Glucose-Capillary: 91 mg/dL (ref 70–99)
Glucose-Capillary: 95 mg/dL (ref 70–99)
Glucose-Capillary: 101 mg/dL — ABNORMAL HIGH (ref 70–99)
Glucose-Capillary: 64 mg/dL — ABNORMAL LOW (ref 70–99)
Glucose-Capillary: 111 mg/dL — ABNORMAL HIGH (ref 70–99)
Glucose-Capillary: 93 mg/dL (ref 70–99)

## 2020-11-04 LAB — MAGNESIUM: Magnesium: 2.6 mg/dL — ABNORMAL HIGH (ref 1.7–2.4)

## 2020-11-04 IMAGING — MR MR HEAD W/O CM
4 of 8 series · 23 of 48 positions shown · non-contrast
Comparison: MR head without contrast [DATE]

CLINICAL DATA: Status post resection of meningioma.

EXAM:
MRI HEAD WITHOUT CONTRAST
TECHNIQUE: Multiplanar, multiecho pulse sequences of the brain and surrounding
structures were obtained without intravenous contrast.

[Series 2: DWI · axial · 3.0mm · 0.94mm/px · z∈[-113,+34]mm · 9 of 100 slices shown (1 of 2)]
[im 1/100]
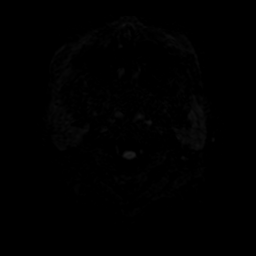
[im 17/100]
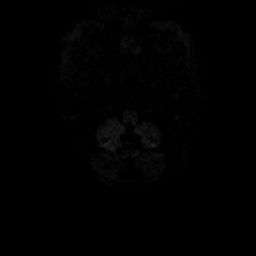
[im 34/100]
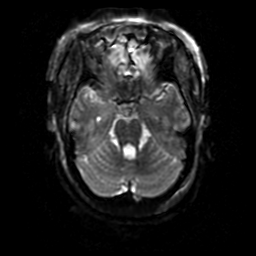
[im 42/100]
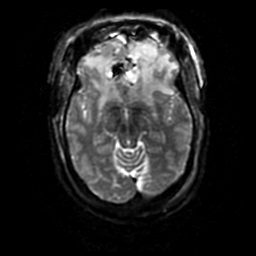
[im 50/100]
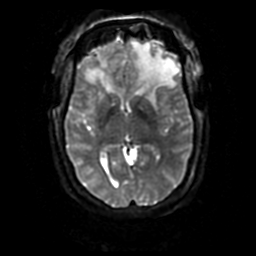
[im 58/100]
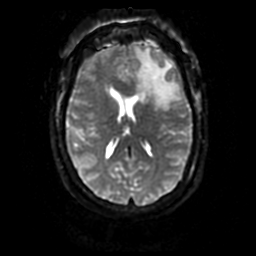
[im 67/100]
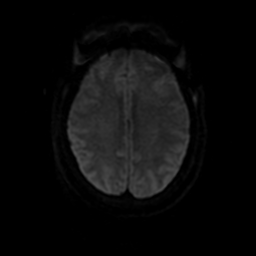
[im 83/100]
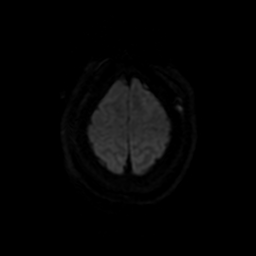
[im 100/100]
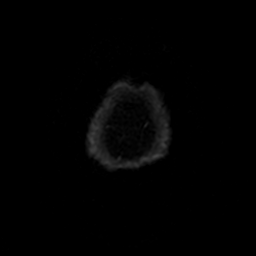

[Series 3: DWI · coronal · 4.0mm · 0.94mm/px · 8 of 76 slices shown (2 of 2)]
[im 1/76]
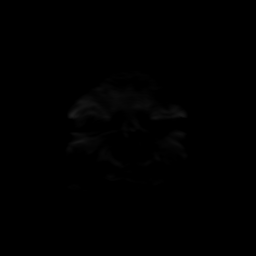
[im 9/76]
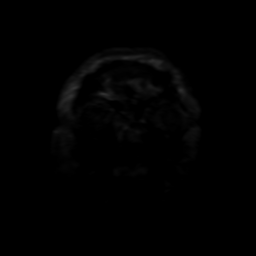
[im 26/76]
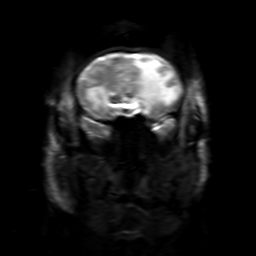
[im 34/76]
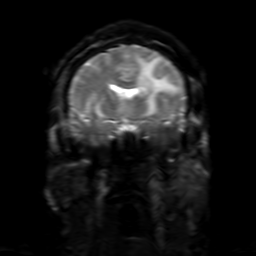
[im 42/76]
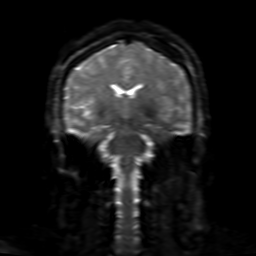
[im 51/76]
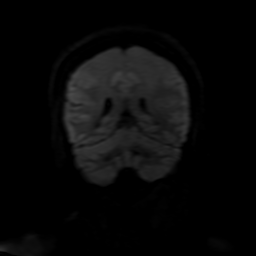
[im 67/76]
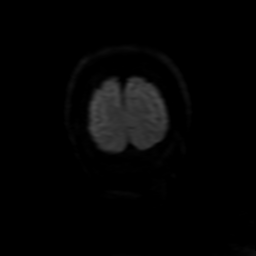
[im 76/76]
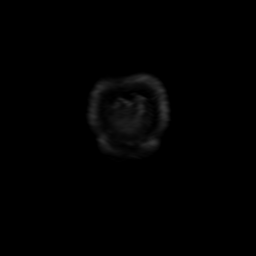

[Series 4: FLAIR · sagittal · 5.0mm · 0.23mm/px · 3 of 23 slices shown (1 of 2)]
[im 1/23]
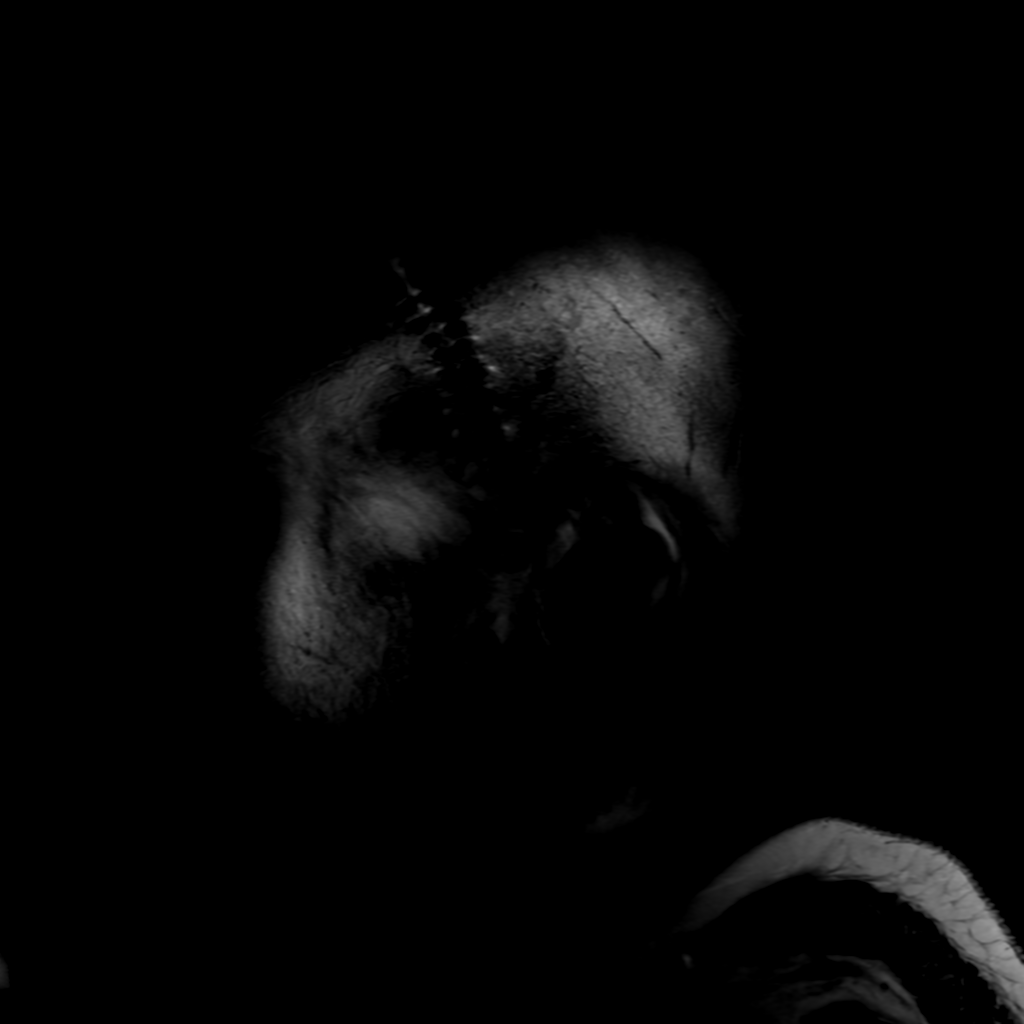
[im 12/23]
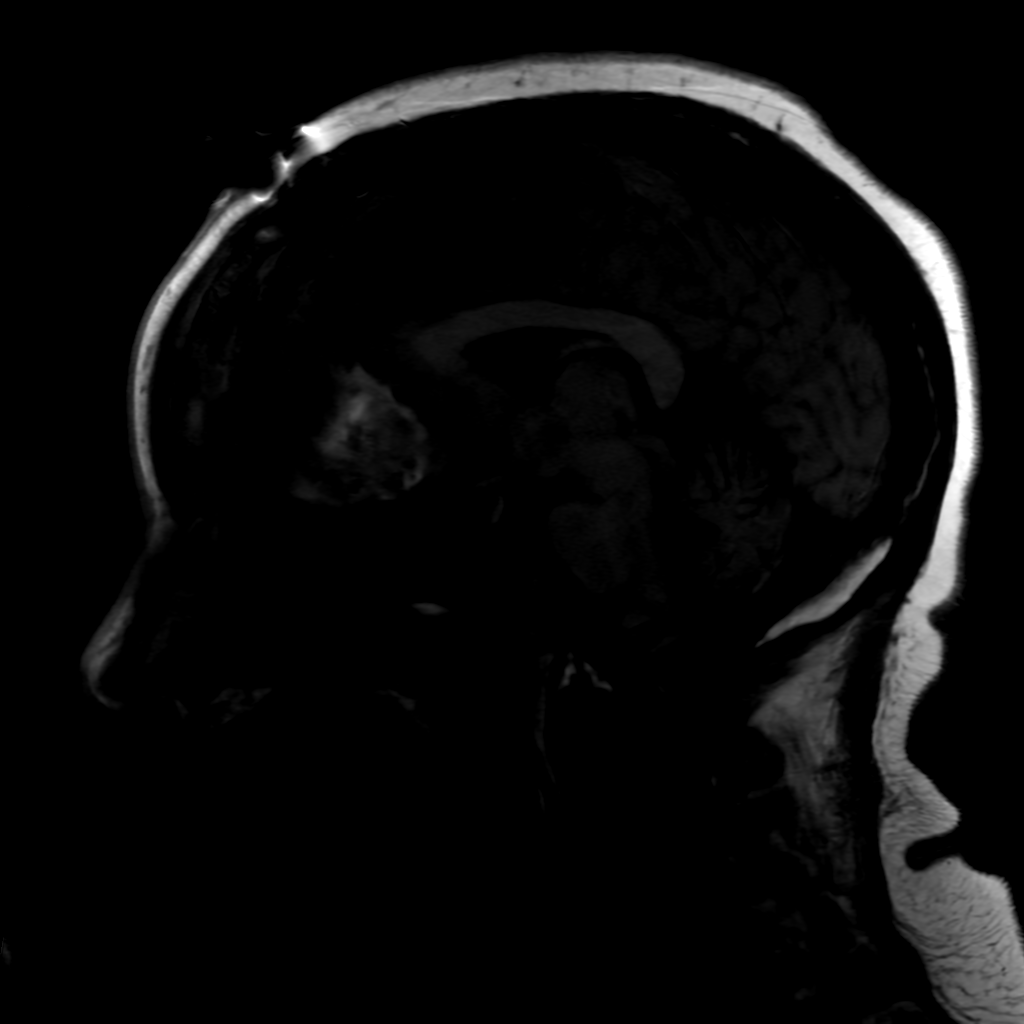
[im 23/23]
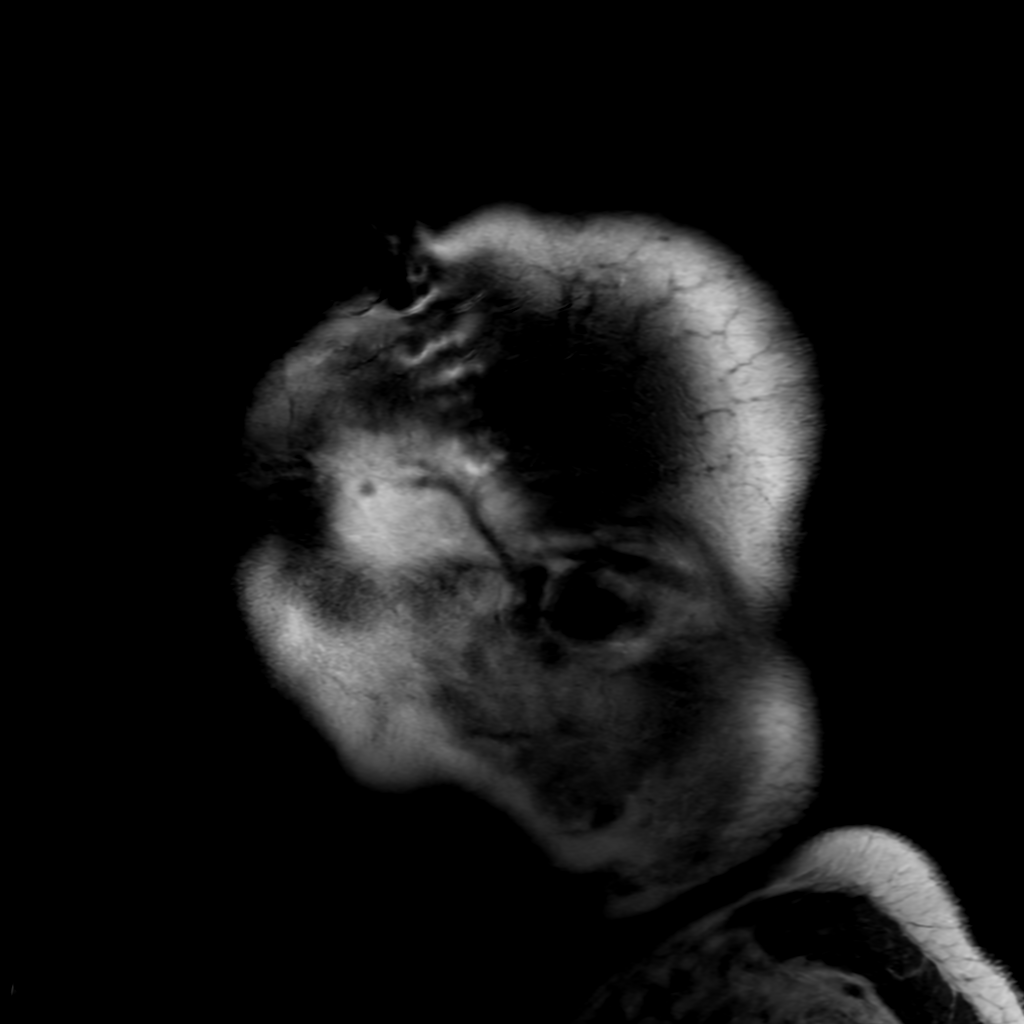

[Series 6: FLAIR · axial · 3.0mm · 0.47mm/px · z∈[-113,+36]mm · 3 of 26 slices shown (2 of 2)]
[im 1/26]
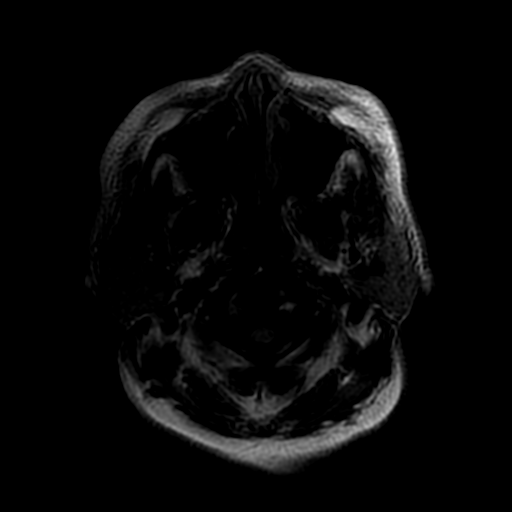
[im 13/26]
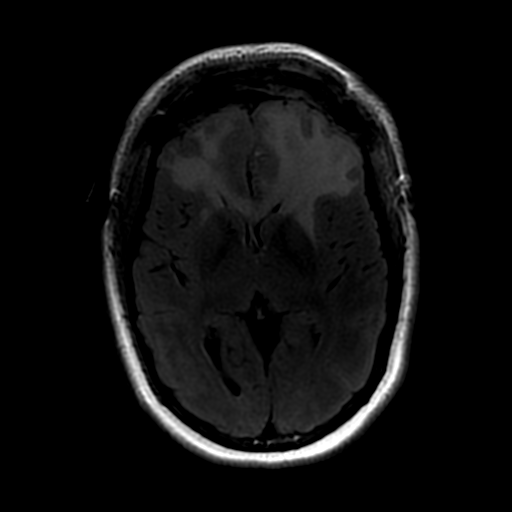
[im 26/26]
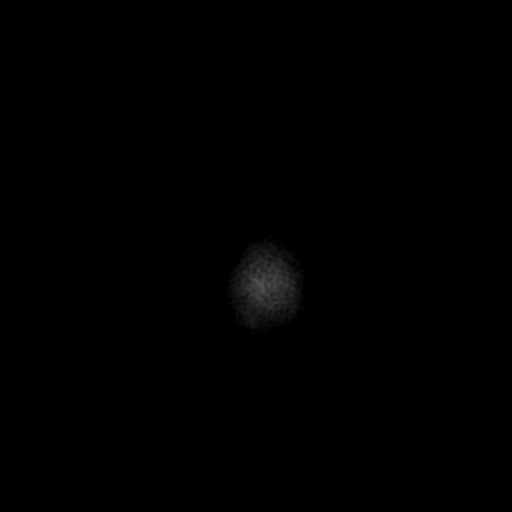

[23 of 48 positions shown; findings below may reference images not displayed]

FINDINGS: Brain: Bifrontal craniotomy noted. Planum sphenoidale meningioma is
grossly resected. Blood products are present within the resection
cavity. Surrounding vasogenic edema stable with decreased mass
effect.

Ventricles partially re-expanded.

Restricted diffusion noted in the anterior frontal lobes
bilaterally.

Basal ganglia within normal limits bilaterally. The brainstem and
cerebellum are within normal limits.

Vascular: Flow is present in the major intracranial arteries.

Skull and upper cervical spine: Craniocervical junction within
normal limits. Diffuse decreased marrow signal noted. Discrete
lesions present.

Sinuses/Orbits: Ethmoid frontal sinus is opacified. Bilateral
mastoid effusions noted. Globes and orbits are unremarkable.
IMPRESSION: 1. Bifrontal craniotomy for resection of meningioma.
2. Stable vasogenic edema and decreased mass effect.
3. Restricted diffusion in the anterior frontal lobes bilaterally
concerning for infarct.
4. Blood products within the resection cavity.
5. Diffuse decreased marrow signal. This is likely related to
anemia.

## 2020-11-04 MED ORDER — CARVEDILOL 6.25 MG PO TABS
6.2500 mg | ORAL_TABLET | Freq: Two times a day (BID) | ORAL | Status: DC
  Administered 2020-11-04 – 2020-11-07 (×6): 6.25 mg via ORAL
  Filled 2020-11-04 (×3): qty 2
  Filled 2020-11-04: qty 1
  Filled 2020-11-04 (×3): qty 2

## 2020-11-04 MED ORDER — CLEVIDIPINE BUTYRATE 0.5 MG/ML IV EMUL
0.0000 mg/h | INTRAVENOUS | Status: DC
  Administered 2020-11-04: 12 mg/h via INTRAVENOUS
  Administered 2020-11-04: 7 mg/h via INTRAVENOUS
  Administered 2020-11-05: 13 mg/h via INTRAVENOUS
  Filled 2020-11-04 (×2): qty 100
  Filled 2020-11-04: qty 50

## 2020-11-04 MED ORDER — AMLODIPINE BESYLATE 10 MG PO TABS
10.0000 mg | ORAL_TABLET | Freq: Every day | ORAL | Status: DC
  Administered 2020-11-04 – 2020-11-10 (×6): 10 mg via ORAL
  Filled 2020-11-04 (×6): qty 1

## 2020-11-04 MED ORDER — QUETIAPINE FUMARATE 100 MG PO TABS
100.0000 mg | ORAL_TABLET | Freq: Every day | ORAL | Status: DC
  Administered 2020-11-04: 100 mg via ORAL
  Filled 2020-11-04 (×2): qty 1

## 2020-11-04 MED ORDER — DEXTROSE 50 % IV SOLN
INTRAVENOUS | Status: AC
  Administered 2020-11-04: 25 mL
  Filled 2020-11-04: qty 50

## 2020-11-04 MED ORDER — DEXTROSE 50 % IV SOLN: 25.0000 mL | Freq: Once | INTRAVENOUS | Status: AC

## 2020-11-04 MED ORDER — OXYCODONE HCL 5 MG PO TABS
2.5000 mg | ORAL_TABLET | Freq: Four times a day (QID) | ORAL | Status: DC | PRN
Start: 2020-11-04 — End: 2020-11-05
  Administered 2020-11-04 – 2020-11-05 (×2): 2.5 mg via ORAL
  Filled 2020-11-04 (×4): qty 1

## 2020-11-04 MED ORDER — QUETIAPINE FUMARATE 100 MG PO TABS
100.0000 mg | ORAL_TABLET | Freq: Once | ORAL | Status: AC
  Administered 2020-11-04: 100 mg via ORAL

## 2020-11-04 NOTE — Progress Notes (Signed)
NAME:  Ruth Gutierrez, MRN:  [id number], DOB:  1966-02-05, LOS: 6 ADMISSION DATE:  10/29/2020, CONSULTATION DATE:  11/01/2020 REFERRING MD:  Dr. Marcello Moores, CHIEF COMPLAINT:  Intracranial mass  History of Present Illness:  55 year old female with prior history of anuric ESRD (MWF, LUE AVF) s/p failed renal transplant 2010/ removed 2017, HTN, and anemia of chronic disease who presented on 3/28 after becoming lethargic at [facility].  Patient had reported SOB with productive clear cough, no fever for several days and progressive generalized weakness for several weeks.  Also reported intermittent severe frontal headaches for several months. No dialysis sessions missed.  Her workup showed CXR concerning for bibasilar pna vs pulmonary edema noted to be hypertensive 244/101 initially and hypoxic 89% on room air.  She was started on cefepime and vanc but developed itching and emesis with vanc, therefore stopped.  CTH showed significant large mass on cribriform plate c/w meningioma w/peritumoral cyst and with marked vasogenic edema.  She was started on dexamethasone and neurosurgery consulted. Nephrology consulted given ESRD. MRI confirmed large meningioma.  Since, admission, her mental status and dyspnea improved with decadron and dialysis.  She went 3/31 for resection by Dr. Marcello Moores.  She was extubated in PACU.  PCCM consulted for further medical management as patient to return to ICU post-operatively.   Pertinent  Medical History  Anuric ESRD (MWF, LUE AVF) s/p failed renal transplant 2010/ removed 2017, HTN, anemia of chronic disease  Tobacco abuse  Significant Hospital Events: Including procedures, antibiotic start and stop dates in addition to other pertinent events   3/28 >>> admitted to IMTS with AMS 2/2 intracranial mass.  NSGY and nephrology consulted.  Started on decadron.  Also being treated for possible HCAP- cefepime/ vanc but developed rxn to vanc.  Noted to be severely HTN.  MRI confirmed large  meningioma of the anterior cranial fossa causing severe vasogenic edema throughout both frontal lobes, left worse than right. 3/29 iHD- 2L off , feeling better, less SOB, improving mental status  3/30 iHD- 2L off  3/31 underwent resection of meningioma, returns to ICU postoperatively, extubated in PACU, MAP goal 70-90, R radial aline  Interim History / Subjective:  Patient remained calm, requiring Precedex infusion to prevent agitation On CRRT  Objective   Blood pressure (!) 119/95, pulse 60, temperature (!) 97.3 F (36.3 C), temperature source Oral, resp. rate 13, weight 75.5 kg, last menstrual period 06/02/2015, SpO2 98 %.      Aline reading 218/93 (140)  Intake/Output Summary (Last 24 hours) at 11/04/2020 0947 Last data filed at 11/04/2020 8185 Gross per 24 hour  Intake 1284.19 ml  Output 1539 ml  Net -254.81 ml   Filed Weights   10/31/20 0021 10/31/20 1426 10/31/20 1710  Weight: 76.7 kg 77.7 kg 75.5 kg    Examination: General:  Adult African-American female, lying in the bed HEENT: MM pale/moist, frontal dressing intact/ dry with hemovac in place, pupils 3/reactive.  Bilateral eyelids are swollen due to edema Neuro: Awake, alert, following simple commands, antigravity in all 4 extremities. CV: Regular rate and rhythm, no murmur, LUE AVF PULM:  Non labored, coarse breath sounds, strong cough GI: Soft, nondistended bowel sounds present Extremities: warm/dry, no LE edema  Skin: no rashes   Labs/imaging that I havepersonally reviewed  (right click and "Reselect all SmartList Selections" daily)  3/28 CTH > large mass on cribriform plate c/w meningioma w/ peritumoral cyst with marked vasogenic edema and elevated ICP.   3/28 MRI brain >>  4.2 x 3.7 x 2.8 cm meningioma of the anterior cranial fossa causing severe vasogenic edema throughout both frontal lobes, left worse than right.  3/29 TTE LVEF 60-65%, moderate LVH, G1DD, normal RV, mildly elevated PASP, moderate TR, trivial  MR  Resolved Hospital Problem list   Acute hypoxic respiratory failure  Assessment & Plan:   Large anterior skull base tumor, likely meningioma per MRI with significant bifrontal edema s/p resection 3/31 Management per neurosurgery Continue Decadron 4 mg every 8 hours Continue neuro check every hour Map goal 70-90 Avoid NSAIDs Continue zofran prn nausea, hold for QTc > 500 Continue keppra for seizure ppx per NSGY follow surgical pathology MRI brain pending  Acute hyperactive delirium Due to frontal tumor and steroid therapy On Precedex infusion, try to wean off today Started on Seroquel  HTN MAP parameters as above, goal 70-90, continue as needed labetalol Still on low-dose clevidipine Started on home dose amlodipine and Coreg  ESRD on HD Currently on CRRT per nephrology  Secondary hyperparathyroidism, hyperphosphatemia Monitor phosphorus Continue sensipar and hecerol per Nephrology  Anemia of chronic disease with ABLA Patient noted to have bleeding from AVF on admit s/p 1 unit PRBC 3/31 for Hgb 6.5-> 9.2 Monitor H&H, transfuse if less than 7 mircera q 2 weeks/ iron weekly  Tobacco abuse Counseling provided regarding quit smoking  Best practice (right click and "Reselect all SmartList Selections" daily)  Diet: Renal diet Pain/Anxiety/Delirium protocol (if indicated): Yes on Precedex VAP protocol (if indicated): Not indicated DVT prophylaxis: SCD GI prophylaxis: PPI Glucose control:  SSI No, consider if > 180 Central venous access:  N/A Arterial line:  Yes, not needed Foley:  N/A Mobility:  bed rest  PT consulted: N/A Last date of multidisciplinary goals of care discussion Code Status:  full code Disposition: Neuro ICU  Labs   CBC: Recent Labs  Lab 10/29/20 0944 10/29/20 1031 10/30/20 1704 10/31/20 0424 10/31/20 1839 11/01/20 0945 11/01/20 1641 11/01/20 1818 11/01/20 1925 11/02/20 0500  WBC 10.4   < > 8.5 8.2 11.5* 10.1  --   --   --  14.2*   NEUTROABS 8.9*  --   --  7.1  --   --   --   --   --  11.2*  HGB 9.2*   < > 7.7* 7.8* 8.4* 8.3* 6.5* 9.5* 9.2* 10.3*  HCT 28.8*   < > 23.0* 23.3* 25.9* 26.4* 19.0* 28.0* 27.0* 29.9*  MCV 96.6   < > 94.3 94.0 95.2 97.4  --   --   --  93.1  PLT 137*   < > 103* 107* 155 141*  --   --   --  206   < > = values in this interval not displayed.    Basic Metabolic Panel: Recent Labs  Lab 10/29/20 1524 10/30/20 0302 10/31/20 0932 10/31/20 1839 11/02/20 0500 11/02/20 1800 11/03/20 0500 11/03/20 1550 11/04/20 0540  NA  --    < >  --    < > 136 135 134* 136 135  K  --    < >  --    < > 5.4* 5.4* 5.1 5.0 5.1  CL  --    < >  --    < > 99 98 100 104 104  CO2  --    < >  --    < > 19* 23 24 22 24   GLUCOSE  --    < >  --    < > 149* 148*  147* 126* 115*  BUN  --    < >  --    < > 72* 72* 43* 31* 25*  CREATININE  --    < >  --    < > 8.94* 8.83* 5.05* 3.65* 2.94*  CALCIUM  --    < >  --    < > 7.6* 7.4* 7.5* 7.9* 7.6*  MG 2.7*  --  2.5*  --   --   --  2.6*  --  2.6*  PHOS  --    < >  --   --  8.4* 8.7* 6.7* 5.9* 4.8*   < > = values in this interval not displayed.   GFR: CrCl cannot be calculated (Unknown ideal weight.). Recent Labs  Lab 10/29/20 0944 10/29/20 1502 10/29/20 1522 10/30/20 0302 10/31/20 0424 10/31/20 1839 11/01/20 0945 11/02/20 0500  PROCALCITON  --  0.79  --   --   --   --   --   --   WBC 10.4  --   --    < > 8.2 11.5* 10.1 14.2*  LATICACIDVEN 2.4*  --  1.1  --   --   --   --   --    < > = values in this interval not displayed.    Liver Function Tests: Recent Labs  Lab 10/29/20 0944 10/30/20 0302 11/02/20 0500 11/02/20 1800 11/03/20 0500 11/03/20 1550 11/04/20 0540  AST 30  --   --   --   --   --   --   ALT 20  --   --   --   --   --   --   ALKPHOS 256*  --   --   --   --   --   --   BILITOT 1.2  --   --   --   --   --   --   PROT 6.7  --   --   --   --   --   --   ALBUMIN 3.3*   < > 3.1* 2.8* 2.7* 2.5* 2.5*   < > = values in this interval not displayed.    No results for input(s): LIPASE, AMYLASE in the last 168 hours. Recent Labs  Lab 10/29/20 1155  AMMONIA 16    ABG    Component Value Date/Time   PHART 7.437 11/01/2020 1925   PCO2ART 33.9 11/01/2020 1925   PO2ART 102 11/01/2020 1925   HCO3 22.9 11/01/2020 1925   TCO2 24 11/01/2020 1925   ACIDBASEDEF 1.0 11/01/2020 1925   O2SAT 98.0 11/01/2020 1925     Total critical care time: 35 minutes  Performed by: Kimberly care time was exclusive of separately billable procedures and treating other patients.   Critical care was necessary to treat or prevent imminent or life-threatening deterioration.   Critical care was time spent personally by me on the following activities: development of treatment plan with patient and/or surrogate as well as nursing, discussions with consultants, evaluation of patient's response to treatment, examination of patient, obtaining history from patient or surrogate, ordering and performing treatments and interventions, ordering and review of laboratory studies, ordering and review of radiographic studies, pulse oximetry and re-evaluation of patient's condition.   Jacky Kindle MD [facility] See Amion for pager If no response to pager, please call [phone] until 7pm After 7pm, Please call E-link [phone]

## 2020-11-04 NOTE — Progress Notes (Signed)
Patient appears to be quite stable.  She is awake today.  Her dressing is dry.  She remains on hemodialysis.  CCM feels she is ready for transfer to the floor.

## 2020-11-04 NOTE — Progress Notes (Addendum)
[facility] ROUNDING NOTE   Subjective:   History: 55 year old lady end-stage renal disease status post failed transplant 2010 history of hypertension altered mental status admitted for meningioma with significant vasogenic edema.  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  Appreciate assistance of Dr. Marcello Moores.  She received a dialysis treatment in screens per kidney center on a Monday Wednesday Friday schedule.  CRRT started 11/02/2020 due to large tumor with edema and concerns of osmotic shifts.  She seems to be tolerating CRRT well with no anticoagulation.   Blood pressure blood pressure 147/87 pulse 67 temperature 97.3 O2 sats 96%  Sodium 135 potassium 5.1 chloride 104 CO2 24 BUN 25 creatinine 2.94 glucose 115 calcium 7.6 phosphorus 4.8 albumin 2.5 hemoglobin 10.3  Decadron every 8 hours, Hectorol 5 mcg Monday Wednesday Friday Aranesp 100 mcg weekly   Objective:  Vital signs in last 24 hours:  Temp:  [94 F (34.4 C)-97.4 F (36.3 C)] 97.3 F (36.3 C) (04/03 0800) Pulse Rate:  [48-65] 60 (04/03 0500) Resp:  [11-22] 13 (04/03 0500) SpO2:  [92 %-100 %] 98 % (04/03 0500) Arterial Line BP: (123-156)/(66-80) 139/72 (04/03 0500)  Weight change:  Filed Weights   10/31/20 0021 10/31/20 1426 10/31/20 1710  Weight: 76.7 kg 77.7 kg 75.5 kg    Intake/Output: I/O last 3 completed shifts: In: 1895.7 [I.V.:1446; IV Piggyback:449.7] Out: 2085 [Drains:15; Other:2070]   Intake/Output this shift:  Total I/O In: 118.6 [I.V.:118.6] Out: 126 [Other:126]  Constitutional:  , no acute distress ENMT: s/p craniotomy CV: normal rate, trace edema Respiratory: clear to auscultation, normal work of breathing Gastrointestinal: soft, non-tender, no palpable masses or hernias Skin: no visible lesions or rashes Psych: Awake, alert, answers most questions appropriately   Basic Metabolic Panel: Recent Labs  Lab 10/29/20 1524 10/30/20 0302  10/31/20 0932 10/31/20 1839 11/02/20 0500 11/02/20 1800 11/03/20 0500 11/03/20 1550 11/04/20 0540  NA  --    < >  --    < > 136 135 134* 136 135  K  --    < >  --    < > 5.4* 5.4* 5.1 5.0 5.1  CL  --    < >  --    < > 99 98 100 104 104  CO2  --    < >  --    < > 19* 23 24 22 24   GLUCOSE  --    < >  --    < > 149* 148* 147* 126* 115*  BUN  --    < >  --    < > 72* 72* 43* 31* 25*  CREATININE  --    < >  --    < > 8.94* 8.83* 5.05* 3.65* 2.94*  CALCIUM  --    < >  --    < > 7.6* 7.4* 7.5* 7.9* 7.6*  MG 2.7*  --  2.5*  --   --   --  2.6*  --  2.6*  PHOS  --    < >  --   --  8.4* 8.7* 6.7* 5.9* 4.8*   < > = values in this interval not displayed.    Liver Function Tests: Recent Labs  Lab 10/29/20 0944 10/30/20 0302 11/02/20 0500 11/02/20 1800 11/03/20 0500 11/03/20 1550 11/04/20 0540  AST 30  --   --   --   --   --   --   ALT 20  --   --   --   --   --   --  ALKPHOS 256*  --   --   --   --   --   --   BILITOT 1.2  --   --   --   --   --   --   PROT 6.7  --   --   --   --   --   --   ALBUMIN 3.3*   < > 3.1* 2.8* 2.7* 2.5* 2.5*   < > = values in this interval not displayed.   No results for input(s): LIPASE, AMYLASE in the last 168 hours. Recent Labs  Lab 10/29/20 1155  AMMONIA 16    CBC: Recent Labs  Lab 10/29/20 0944 10/29/20 1031 10/30/20 1704 10/31/20 0424 10/31/20 1839 11/01/20 0945 11/01/20 1641 11/01/20 1818 11/01/20 1925 11/02/20 0500  WBC 10.4   < > 8.5 8.2 11.5* 10.1  --   --   --  14.2*  NEUTROABS 8.9*  --   --  7.1  --   --   --   --   --  11.2*  HGB 9.2*   < > 7.7* 7.8* 8.4* 8.3* 6.5* 9.5* 9.2* 10.3*  HCT 28.8*   < > 23.0* 23.3* 25.9* 26.4* 19.0* 28.0* 27.0* 29.9*  MCV 96.6   < > 94.3 94.0 95.2 97.4  --   --   --  93.1  PLT 137*   < > 103* 107* 155 141*  --   --   --  206   < > = values in this interval not displayed.    Cardiac Enzymes: No results for input(s): CKTOTAL, CKMB, CKMBINDEX, TROPONINI in the last 168 hours.  BNP: Invalid  input(s): POCBNP  CBG: Recent Labs  Lab 11/03/20 1543 11/03/20 1947 11/03/20 2340 11/04/20 0337 11/04/20 0840  GLUCAP 135* 133* 152* 96 91    Microbiology: Results for orders placed or performed during the hospital encounter of 10/29/20  Resp Panel by RT-PCR (Flu A&B, Covid) Nasopharyngeal Swab     Status: None   Collection Time: 10/29/20  9:45 AM   Specimen: Nasopharyngeal Swab; Nasopharyngeal(NP) swabs in vial transport medium  Result Value Ref Range Status   SARS Coronavirus 2 by RT PCR NEGATIVE NEGATIVE Final    Comment: (NOTE) SARS-CoV-2 target nucleic acids are NOT DETECTED.  The SARS-CoV-2 RNA is generally detectable in upper respiratory specimens during the acute phase of infection. The lowest concentration of SARS-CoV-2 viral copies this assay can detect is 138 copies/mL. A negative result does not preclude SARS-Cov-2 infection and should not be used as the sole basis for treatment or other patient management decisions. A negative result may occur with  improper specimen collection/handling, submission of specimen other than nasopharyngeal swab, presence of viral mutation(s) within the areas targeted by this assay, and inadequate number of viral copies(<138 copies/mL). A negative result must be combined with clinical observations, patient history, and epidemiological information. The expected result is Negative.  Fact Sheet for Patients:  EntrepreneurPulse.com.au  Fact Sheet for Healthcare Providers:  IncredibleEmployment.be  This test is no t yet approved or cleared by the [place] FDA and  has been authorized for detection and/or diagnosis of SARS-CoV-2 by FDA under an Emergency Use Authorization (EUA). This EUA will remain  in effect (meaning this test can be used) for the duration of the COVID-19 declaration under Section 564(b)(1) of the Act, 21 U.S.C.section 360bbb-3(b)(1), unless the authorization is terminated   or revoked sooner.       Influenza A by PCR NEGATIVE  NEGATIVE Final   Influenza B by PCR NEGATIVE NEGATIVE Final    Comment: (NOTE) The Xpert Xpress SARS-CoV-2/FLU/RSV plus assay is intended as an aid in the diagnosis of influenza from Nasopharyngeal swab specimens and should not be used as a sole basis for treatment. Nasal washings and aspirates are unacceptable for Xpert Xpress SARS-CoV-2/FLU/RSV testing.  Fact Sheet for Patients: EntrepreneurPulse.com.au  Fact Sheet for Healthcare Providers: IncredibleEmployment.be  This test is not yet approved or cleared by the [place] FDA and has been authorized for detection and/or diagnosis of SARS-CoV-2 by FDA under an Emergency Use Authorization (EUA). This EUA will remain in effect (meaning this test can be used) for the duration of the COVID-19 declaration under Section 564(b)(1) of the Act, 21 U.S.C. section 360bbb-3(b)(1), unless the authorization is terminated or revoked.  Performed at [facility], [place] [address] [place], [place] [address]   Blood culture (routine x 2)     Status: None   Collection Time: 10/29/20 11:07 AM   Specimen: BLOOD  Result Value Ref Range Status   Specimen Description BLOOD SITE NOT SPECIFIED  Final   Special Requests   Final    BOTTLES DRAWN AEROBIC AND ANAEROBIC Blood Culture adequate volume   Culture   Final    NO GROWTH 5 DAYS Performed at [facility], [address] [place], [place] [address]    Report Status 11/03/2020 FINAL  Final  Blood culture (routine x 2)     Status: None   Collection Time: 10/29/20 11:12 AM   Specimen: BLOOD RIGHT ARM  Result Value Ref Range Status   Specimen Description BLOOD RIGHT ARM  Final   Special Requests   Final    BOTTLES DRAWN AEROBIC AND ANAEROBIC Blood Culture results may not be optimal due to an inadequate volume of blood received in culture bottles   Culture   Final    NO GROWTH 5  DAYS Performed at [facility], [place] [address], [place], [place] [address]    Report Status 11/03/2020 FINAL  Final  Respiratory (~20 pathogens) panel by PCR     Status: None   Collection Time: 10/29/20  3:22 PM   Specimen: Nasopharyngeal Swab; Respiratory  Result Value Ref Range Status   Adenovirus NOT DETECTED NOT DETECTED Final   Coronavirus 229E NOT DETECTED NOT DETECTED Final    Comment: (NOTE) The Coronavirus on the Respiratory Panel, DOES NOT test for the novel  Coronavirus (2019 nCoV)    Coronavirus HKU1 NOT DETECTED NOT DETECTED Final   Coronavirus NL63 NOT DETECTED NOT DETECTED Final   Coronavirus OC43 NOT DETECTED NOT DETECTED Final   Metapneumovirus NOT DETECTED NOT DETECTED Final   Rhinovirus / Enterovirus NOT DETECTED NOT DETECTED Final   Influenza A NOT DETECTED NOT DETECTED Final   Influenza B NOT DETECTED NOT DETECTED Final   Parainfluenza Virus 1 NOT DETECTED NOT DETECTED Final   Parainfluenza Virus 2 NOT DETECTED NOT DETECTED Final   Parainfluenza Virus 3 NOT DETECTED NOT DETECTED Final   Parainfluenza Virus 4 NOT DETECTED NOT DETECTED Final   Respiratory Syncytial Virus NOT DETECTED NOT DETECTED Final   Bordetella pertussis NOT DETECTED NOT DETECTED Final   Bordetella Parapertussis NOT DETECTED NOT DETECTED Final   Chlamydophila pneumoniae NOT DETECTED NOT DETECTED Final   Mycoplasma pneumoniae NOT DETECTED NOT DETECTED Final    Comment: Performed at [facility], [place] [address] [place], [place] [address]  MRSA PCR Screening  Status: Abnormal   Collection Time: 10/30/20  7:59 PM   Specimen: Nasal Mucosa; Nasopharyngeal  Result Value Ref Range Status   MRSA by PCR (A) NEGATIVE Final    INVALID, UNABLE TO DETERMINE THE PRESENCE OF TARGET DUE TO SPECIMEN INTEGRITY. RECOLLECTION REQUESTED.    Comment: RESULT CALLED TO, READ BACK BY AND VERIFIED WITHVeva Holes RN 11/01/20 0024 JDW Performed at [facility], [address], [place],  [place] [address]   Surgical pcr screen     Status: None   Collection Time: 11/01/20 12:43 AM   Specimen: Nasal Mucosa; Nasal Swab  Result Value Ref Range Status   MRSA, PCR NEGATIVE NEGATIVE Final   Staphylococcus aureus NEGATIVE NEGATIVE Final    Comment: (NOTE) The Xpert SA Assay (FDA approved for NASAL specimens in patients 81 years of age and older), is one component of a comprehensive surveillance program. It is not intended to diagnose infection nor to guide or monitor treatment. Performed at [facility], [place] [address], [place], [place] [address]     Coagulation Studies: No results for input(s): LABPROT, INR in the last 72 hours.  Urinalysis: No results for input(s): COLORURINE, LABSPEC, PHURINE, GLUCOSEU, HGBUR, BILIRUBINUR, KETONESUR, PROTEINUR, UROBILINOGEN, NITRITE, LEUKOCYTESUR in the last 72 hours.  Invalid input(s): APPERANCEUR    Imaging: DG Chest Port 1 View  Result Date: 11/02/2020 CLINICAL DATA:  Central catheter placement EXAM: PORTABLE CHEST 1 VIEW COMPARISON:  October 31, 2020 FINDINGS: Central catheter tip is in the superior vena cava near the cavoatrial junction. No pneumothorax. There is interstitial pulmonary edema, similar to 2 days prior. No consolidation. There is cardiomegaly with mild pulmonary venous hypertension. No adenopathy. No bone lesions. IMPRESSION: Central catheter tip in superior vena cava near cavoatrial junction. No pneumothorax. Persistent cardiomegaly with a degree of pulmonary vascular congestion and interstitial pulmonary edema. No consolidation. Stable cardiac silhouette. Electronically Signed   By: Lowella Grip III M.D.   On: 11/02/2020 15:11     Medications:   .  prismasol BGK 4/2.5 200 mL/hr at 11/03/20 1934  .  prismasol BGK 4/2.5 200 mL/hr at 11/03/20 1935  . sodium chloride    . sodium chloride    . sodium chloride Stopped (11/03/20 2246)  . clevidipine 8 mg/hr (11/04/20 0921)  . dexmedetomidine (PRECEDEX) IV infusion  1.2 mcg/kg/hr (11/04/20 0925)  . levETIRAcetam 500 mg (11/04/20 0926)  . prismasol BGK 4/2.5 1,000 mL/hr at 11/04/20 0809   . amLODipine  10 mg Oral Daily  . carvedilol  6.25 mg Oral BID WC  . Chlorhexidine Gluconate Cloth  6 each Topical Q0600  . cinacalcet  180 mg Oral Q M,W,F  . [START ON 11/05/2020] darbepoetin (ARANESP) injection - DIALYSIS  100 mcg Intravenous Q Mon-HD  . dexamethasone (DECADRON) injection  4 mg Intravenous Q8H  . docusate sodium  100 mg Oral BID  . doxercalciferol  5 mcg Oral Once per day on Mon Wed Fri  . lidocaine-prilocaine   Topical Q M,W,F-HD  . pantoprazole  40 mg Oral Daily  . QUEtiapine  100 mg Oral QHS  . sodium chloride flush  10-40 mL Intracatheter Q12H  . sucroferric oxyhydroxide  1,000 mg Oral TID WC   sodium chloride, sodium chloride, acetaminophen **OR** acetaminophen, alteplase, enalaprilat, heparin, heparin, labetalol, lidocaine (PF), lidocaine-prilocaine, ondansetron **OR** ondansetron (ZOFRAN) IV, pentafluoroprop-tetrafluoroeth, promethazine, sodium chloride, sodium chloride flush  Assessment/ Plan:  MonWedFri, 4 hrs 0 min, 180NRe Optiflux, BFR 400, DFR Manual 500 mL/min, EDW 73.5 (  kg), Dialysate 2.0 K, 2.0 Ca, Sodium 137 (mEq/L), Bicarb Setting: 35 (mEq/L), Access: AVFistula Sensipar 180MWF, hecterol 5MWF, iron 50 weekly, mircera 75 q2 wks(due April 46)  55 year old lady status post resection of meningioma 11/01/2020  1. ESRD: MWF.    [place].  Started CRRT 11/02/2020.     Low flow rates.  Avoiding osmotic shifts 2. Volume/ hypertension: being kept even will aim for 25 - 50 cc/hr 3.Anemia of Chronic Kidney Disease:  Darbepoetin ordered 4. Secondary Hyperparathyroidism/Hyperphosphatemia:Continue home sensipar 180mg  and hecterol 11mcg w/ dialysis. On velphoro 1000 w/ meals. 5.Vascular access:LUE AVF w/ thrill and pulsatility. Prolonged bleeding on admission now resolved 6.AMS/Intracranial mass status post excision of meningioma 11/01/2020.  We will  try to minimize the effect of osmotic shifts with dialysis by proceeding with slow continuous dialysis     LOS: [place] @TODAY @10 :11 AM

## 2020-11-05 DIAGNOSIS — G9340 Encephalopathy, unspecified: Secondary | ICD-10-CM | POA: Diagnosis not present

## 2020-11-05 DIAGNOSIS — G9389 Other specified disorders of brain: Secondary | ICD-10-CM | POA: Diagnosis not present

## 2020-11-05 DIAGNOSIS — N186 End stage renal disease: Secondary | ICD-10-CM | POA: Diagnosis not present

## 2020-11-05 LAB — CBC WITH DIFFERENTIAL/PLATELET
Abs Immature Granulocytes: 0.09 10*3/uL — ABNORMAL HIGH (ref 0.00–0.07)
Basophils Absolute: 0 10*3/uL (ref 0.0–0.1)
Basophils Relative: 0 %
Eosinophils Absolute: 0 10*3/uL (ref 0.0–0.5)
Eosinophils Relative: 0 %
HCT: 26.3 % — ABNORMAL LOW (ref 36.0–46.0)
Hemoglobin: 8.5 g/dL — ABNORMAL LOW (ref 12.0–15.0)
Immature Granulocytes: 1 %
Lymphocytes Relative: 14 %
Lymphs Abs: 1.6 10*3/uL (ref 0.7–4.0)
MCH: 31.1 pg (ref 26.0–34.0)
MCHC: 32.3 g/dL (ref 30.0–36.0)
MCV: 96.3 fL (ref 80.0–100.0)
Monocytes Absolute: 0.9 10*3/uL (ref 0.1–1.0)
Monocytes Relative: 8 %
Neutro Abs: 8.8 10*3/uL — ABNORMAL HIGH (ref 1.7–7.7)
Neutrophils Relative %: 77 %
Platelets: 140 10*3/uL — ABNORMAL LOW (ref 150–400)
RBC: 2.73 MIL/uL — ABNORMAL LOW (ref 3.87–5.11)
RDW: 15.7 % — ABNORMAL HIGH (ref 11.5–15.5)
WBC: 11.4 10*3/uL — ABNORMAL HIGH (ref 4.0–10.5)
nRBC: 0 % (ref 0.0–0.2)

## 2020-11-05 LAB — MAGNESIUM: Magnesium: 2.7 mg/dL — ABNORMAL HIGH (ref 1.7–2.4)

## 2020-11-05 LAB — RENAL FUNCTION PANEL
Albumin: 2.5 g/dL — ABNORMAL LOW (ref 3.5–5.0)
Anion gap: 7 (ref 5–15)
BUN: 26 mg/dL — ABNORMAL HIGH (ref 6–20)
CO2: 24 mmol/L (ref 22–32)
Calcium: 7.7 mg/dL — ABNORMAL LOW (ref 8.9–10.3)
Chloride: 104 mmol/L (ref 98–111)
Creatinine, Ser: 3.03 mg/dL — ABNORMAL HIGH (ref 0.44–1.00)
GFR, Estimated: 18 mL/min — ABNORMAL LOW (ref >60)
Glucose, Bld: 91 mg/dL (ref 70–99)
Phosphorus: 4.1 mg/dL (ref 2.5–4.6)
Potassium: 4.7 mmol/L (ref 3.5–5.1)
Sodium: 135 mmol/L (ref 135–145)

## 2020-11-05 LAB — GLUCOSE, CAPILLARY
Glucose-Capillary: 105 mg/dL — ABNORMAL HIGH (ref 70–99)
Glucose-Capillary: 108 mg/dL — ABNORMAL HIGH (ref 70–99)
Glucose-Capillary: 110 mg/dL — ABNORMAL HIGH (ref 70–99)
Glucose-Capillary: 40 mg/dL — CL (ref 70–99)
Glucose-Capillary: 96 mg/dL (ref 70–99)
Glucose-Capillary: 99 mg/dL (ref 70–99)

## 2020-11-05 LAB — SURGICAL PATHOLOGY

## 2020-11-05 MED ORDER — DEXAMETHASONE 2 MG PO TABS
2.0000 mg | ORAL_TABLET | Freq: Two times a day (BID) | ORAL | Status: AC
  Administered 2020-11-05 – 2020-11-07 (×6): 2 mg via ORAL
  Filled 2020-11-05 (×6): qty 1

## 2020-11-05 MED ORDER — OXYMETAZOLINE HCL 0.05 % NA SOLN
1.0000 | Freq: Two times a day (BID) | NASAL | Status: AC
  Administered 2020-11-05 – 2020-11-07 (×4): 1 via NASAL
  Filled 2020-11-05: qty 30

## 2020-11-05 MED ORDER — DEXAMETHASONE 2 MG PO TABS
1.0000 mg | ORAL_TABLET | Freq: Two times a day (BID) | ORAL | Status: DC
  Administered 2020-11-08 – 2020-11-10 (×5): 1 mg via ORAL
  Filled 2020-11-05 (×5): qty 1

## 2020-11-05 MED ORDER — GLUCOSE 40 % PO GEL
2.0000 | ORAL | Status: AC
  Filled 2020-11-05: qty 2

## 2020-11-05 MED ORDER — QUETIAPINE FUMARATE 100 MG PO TABS
100.0000 mg | ORAL_TABLET | Freq: Two times a day (BID) | ORAL | Status: DC
  Administered 2020-11-05 – 2020-11-09 (×9): 100 mg via ORAL
  Filled 2020-11-05 (×9): qty 1

## 2020-11-05 MED ORDER — DEXTROSE 50 % IV SOLN
INTRAVENOUS | Status: AC
  Administered 2020-11-05: 25 mL via INTRAVENOUS
  Filled 2020-11-05: qty 50

## 2020-11-05 MED ORDER — LEVETIRACETAM 250 MG PO TABS
250.0000 mg | ORAL_TABLET | Freq: Two times a day (BID) | ORAL | Status: DC
  Administered 2020-11-05 – 2020-11-10 (×11): 250 mg via ORAL
  Filled 2020-11-05 (×13): qty 1

## 2020-11-05 MED ORDER — DEXAMETHASONE 2 MG PO TABS
1.0000 mg | ORAL_TABLET | Freq: Every day | ORAL | Status: DC
  Filled 2020-11-05: qty 1

## 2020-11-05 MED ORDER — MENTHOL 3 MG MT LOZG
1.0000 | LOZENGE | OROMUCOSAL | Status: DC | PRN
  Filled 2020-11-05: qty 9

## 2020-11-05 MED ORDER — OXYCODONE HCL 5 MG PO TABS
5.0000 mg | ORAL_TABLET | ORAL | Status: DC | PRN
Start: 2020-11-05 — End: 2020-11-10
  Administered 2020-11-05 – 2020-11-10 (×15): 5 mg via ORAL
  Filled 2020-11-05 (×14): qty 1

## 2020-11-05 MED ORDER — CLONIDINE HCL 0.1 MG PO TABS
0.2000 mg | ORAL_TABLET | Freq: Three times a day (TID) | ORAL | Status: DC
  Administered 2020-11-05 – 2020-11-10 (×14): 0.2 mg via ORAL
  Filled 2020-11-05: qty 2
  Filled 2020-11-05 (×3): qty 1
  Filled 2020-11-05: qty 2
  Filled 2020-11-05: qty 1
  Filled 2020-11-05 (×3): qty 2
  Filled 2020-11-05: qty 1
  Filled 2020-11-05 (×4): qty 2

## 2020-11-05 MED ORDER — DEXAMETHASONE SODIUM PHOSPHATE 4 MG/ML IJ SOLN: 2.0000 mg | Freq: Two times a day (BID) | INTRAMUSCULAR | Status: DC

## 2020-11-05 NOTE — Progress Notes (Signed)
Subjective: Patient reports HA has improved  Objective: Vital signs in last 24 hours: Temp:  [97.2 F (36.2 C)-101.7 F (38.7 C)] 98.2 F (36.8 C) (04/04 0800) Pulse Rate:  [54-90] 90 (04/04 0800) Resp:  [11-24] 24 (04/04 0800) BP: (93-183)/(58-103) 183/94 (04/04 0300) SpO2:  [90 %-100 %] 91 % (04/04 0800) Arterial Line BP: (115-208)/(53-87) 202/65 (04/04 0800)  Intake/Output from previous day: 04/03 0701 - 04/04 0700 In: 890 [I.V.:690; IV Piggyback:200] Out: 1219  Intake/Output this shift: Total I/O In: 164.3 [P.O.:120; I.V.:44.3] Out: 10 [Other:69]  Awake, alert, oriented to person, place FC x 4 Periorbital swelling Dressing c/d Drain removed under sterile conditions  Lab Results: No results for input(s): WBC, HGB, HCT, PLT in the last 72 hours. BMET Recent Labs    11/04/20 1543 11/05/20 0556  NA 135 135  K 4.7 4.7  CL 104 104  CO2 24 24  GLUCOSE 247* 91  BUN 23* 26*  CREATININE 2.96* 3.03*  CALCIUM 7.0* 7.7*    Studies/Results: MR BRAIN WO CONTRAST  Result Date: 11/04/2020 CLINICAL DATA:  Status post resection of meningioma. EXAM: MRI HEAD WITHOUT CONTRAST TECHNIQUE: Multiplanar, multiecho pulse sequences of the brain and surrounding structures were obtained without intravenous contrast. COMPARISON:  MR head without contrast 10/29/2020 FINDINGS: Brain: Bifrontal craniotomy noted. Planum sphenoidale meningioma is grossly resected. Blood products are present within the resection cavity. Surrounding vasogenic edema stable with decreased mass effect. Ventricles partially re-expanded. Restricted diffusion noted in the anterior frontal lobes bilaterally. Basal ganglia within normal limits bilaterally. The brainstem and cerebellum are within normal limits. Vascular: Flow is present in the major intracranial arteries. Skull and upper cervical spine: Craniocervical junction within normal limits. Diffuse decreased marrow signal noted. Discrete lesions present.  Sinuses/Orbits: Ethmoid frontal sinus is opacified. Bilateral mastoid effusions noted. Globes and orbits are unremarkable. IMPRESSION: 1. Bifrontal craniotomy for resection of meningioma. 2. Stable vasogenic edema and decreased mass effect. 3. Restricted diffusion in the anterior frontal lobes bilaterally concerning for infarct. 4. Blood products within the resection cavity. 5. Diffuse decreased marrow signal. This is likely related to anemia. Electronically Signed   By: San Morelle M.D.   On: 11/04/2020 21:22    Assessment/Plan: 55 yo F with ESRD s/p resection of large olfactory groove meningioma - MRI reviewed.  Good resection, expected postoperative changes, some improvement in bifrontal lobe edema - decrease dexamethasone to 2 q12 - switch Keppra to PO - ice pack as needed for periorbital swelling - likely transfer out of ICU tomorrow    Ruth Gutierrez 11/05/2020, 9:03 AM

## 2020-11-05 NOTE — Progress Notes (Signed)
[facility] Progress Note  Subjective: pt seen in ICU, stable, responsive  Vitals:   11/05/20 1230 11/05/20 1300 11/05/20 1330 11/05/20 1400  BP:      Pulse: 80 83 84 82  Resp: 14 13 13 14   Temp:      TempSrc:      SpO2: 93% 95% 93% 94%  Weight:        Exam:   alert, nad , head dressing  R facial edema  no jvd  Chest cta bilat  Cor reg no RG  Abd soft ntnd no ascites   Ext no LE edema   Alert, NF, ox3   AVF+bruit     OP HD: MWF  4h  400/500  73.5kg  2/2 bath  AVF  - sensipar 180 mwf  - hect 5 ug mwf  - iron 50 weekly  - mircera 75 q2 due 4/4   Assessment/ Plan: 1. ESRD:MWF HD. Sp CRRT 4/1 - 4/4 , dc'd today. Regular HD on Wed.  2. Volume/ hypertension: close to dry wt +2kg, stable exam 3.Anemia ckd: Darbepoetin ordered for wed 100 ug weekly 4. Secondary Hyperparathyroidism/Hyperphosphatemia:Continue home sensipar 180mg  and hecterol 65mcg w/ dialysis. On velphoro 1000 w/ meals. 5.Vascular access:LUE AVF w/ thrill and pulsatility. Prolonged bleedingon admission now resolved 6. AMS/Intracranial mass: sp excision of meningioma on 3/31  Rob Newt Levingston 11/05/2020, 3:35 PM     Recent Labs  Lab 11/02/20 0500 11/02/20 1800 11/04/20 1543 11/05/20 0556 11/05/20 0903  K 5.4*   < > 4.7 4.7  --   BUN 72*   < > 23* 26*  --   CREATININE 8.94*   < > 2.96* 3.03*  --   CALCIUM 7.6*   < > 7.0* 7.7*  --   PHOS 8.4*   < > 3.7 4.1  --   HGB 10.3*  --   --   --  8.5*   < > = values in this interval not displayed.   Inpatient medications: . amLODipine  10 mg Oral Daily  . carvedilol  6.25 mg Oral BID WC  . Chlorhexidine Gluconate Cloth  6 each Topical Q0600  . cinacalcet  180 mg Oral Q M,W,F  . cloNIDine  0.2 mg Oral TID  . darbepoetin (ARANESP) injection - DIALYSIS  100 mcg Intravenous Q Mon-HD  . dexamethasone  2 mg Oral Q12H   Followed by  . [START ON 11/08/2020] dexamethasone  1 mg Oral Q12H   Followed by  . [START ON 11/11/2020] dexamethasone  1 mg  Oral Daily  . docusate sodium  100 mg Oral BID  . doxercalciferol  5 mcg Oral Once per day on Mon Wed Fri  . levETIRAcetam  250 mg Oral BID  . lidocaine-prilocaine   Topical Q M,W,F-HD  . oxymetazoline  1 spray Each Nare BID  . pantoprazole  40 mg Oral Daily  . QUEtiapine  100 mg Oral BID  . sodium chloride flush  10-40 mL Intracatheter Q12H  . sucroferric oxyhydroxide  1,000 mg Oral TID WC   .  prismasol BGK 4/2.5 Stopped (11/05/20 0935)  .  prismasol BGK 4/2.5 Stopped (11/05/20 0935)  . sodium chloride    . sodium chloride    . sodium chloride 10 mL/hr at 11/05/20 1200  . clevidipine Stopped (11/05/20 0911)  . dexmedetomidine (PRECEDEX) IV infusion Stopped (11/05/20 0837)  . prismasol BGK 4/2.5 1,000 mL/hr at 11/05/20 1478   sodium chloride, sodium chloride, acetaminophen **OR** acetaminophen, alteplase, enalaprilat, heparin, heparin,  labetalol, lidocaine (PF), lidocaine-prilocaine, menthol-cetylpyridinium, ondansetron **OR** ondansetron (ZOFRAN) IV, oxyCODONE, pentafluoroprop-tetrafluoroeth, promethazine, sodium chloride, sodium chloride flush

## 2020-11-05 NOTE — Progress Notes (Signed)
NAME:  Ruth Gutierrez, MRN:  [id number], DOB:  1965/12/29, LOS: 7 ADMISSION DATE:  10/29/2020, CONSULTATION DATE: 11/01/2020 REFERRING MD:  Dr. Marcello Moores, CHIEF COMPLAINT:  Intracranial mass  History of Present Illness:  55 year old female with prior history of anuric ESRD (MWF, LUE AVF) s/p failed renal transplant 2010 removed 2017, HTN, and anemia of chronic disease who presented on 3/28 after becoming lethargic at [facility]. Patient had reported SOB with productive clear cough and progressive generalized weakness for several weeks.  Also reported intermittent severe frontal headaches for several months. No dialysis sessions missed. Her workup showed CXR concerning for bibasilar PNA vs pulmonary edema noted to be hypertensive 244/101 initially and hypoxic 89% on room air.  She was started on cefepime and vanc but developed itching and emesis with vanc, therefore stopped (3/31 - 4/2). CTH showed significant large mass on cribriform plate c/w meningioma w/ peritumoral cyst and with marked vasogenic edema. She was started on dexamethasone and neurosurgery consulted. Nephrology consulted given ESRD. MRI confirmed large meningioma. Since, admission, her mental status and dyspnea improved with decadron and dialysis.  She went 3/31 for resection by Dr. Marcello Moores. She was extubated in PACU.  PCCM consulted for further medical management as patient to return to ICU post-operatively. This morning, patient is stable, engaged, and continues to improve post-operatively.   Pertinent  Medical History  Anuric ESRD (MWF, LUE AVF) s/p failed renal transplant 2010/ removed 2017, HTN, anemia of chronic disease  Tobacco abuse  Significant Hospital Events: Including procedures, antibiotic start and stop dates in addition to other pertinent events   3/28 >>> admitted to IMTS with AMS 2/2 intracranial mass.  NSGY and nephrology consulted.  Started on decadron.  Also being treated for possible HCAP- cefepime/ vanc but developed rxn  to vanc.  Noted to be severely HTN.  MRI confirmed large meningioma of the anterior cranial fossa causing severe vasogenic edema throughout both frontal lobes, left worse than right. 3/29 iHD- 2L off , feeling better, less SOB, improving mental status  3/30 iHD- 2L off  3/31 underwent resection of meningioma, returns to ICU postoperatively, extubated in PACU, MAP goal 70-90, R radial aline  Interim History / Subjective:  Patient reports severe head pain overnight. Describes some throat pain this morning. States that she had a BM yesterday. Had an episode of hypoglycemia this morning requiring glucose/orange juice. Remains on CRRT.   Objective   Blood pressure (!) 183/94, pulse 90, temperature 98.2 F (36.8 C), temperature source Oral, resp. rate (!) 24, weight 75.5 kg, last menstrual period 06/02/2015, SpO2 91 %.      Aline reading 218/93 (140)  Intake/Output Summary (Last 24 hours) at 11/05/2020 0836 Last data filed at 11/05/2020 0800 Gross per 24 hour  Intake 982.58 ml  Output 1218 ml  Net -235.42 ml   Filed Weights   10/31/20 0021 10/31/20 1426 10/31/20 1710  Weight: 76.7 kg 77.7 kg 75.5 kg    Examination: General:  NAD, lying in the bed HEENT: MM pale/moist, frontal dressing intact, dry, and without surrounding induration. Bilateral eyelids are swollen due to edema with R > L. EOMI Neuro: Awake, alert, cooperative, antigravity in all 4 extremities. CV: HDS, no peripheral edema  PULM:  No increased WOB  GI: Soft, nondistended  Skin: no rashes   Labs/imaging that I havepersonally reviewed  (right click and "Reselect all SmartList Selections" daily)  3/28 CTH > large mass on cribriform plate c/w meningioma w/ peritumoral cyst with marked vasogenic edema  and elevated ICP.   3/28 MRI brain >> 4.2 x 3.7 x 2.8 cm meningioma of the anterior cranial fossa causing severe vasogenic edema throughout both frontal lobes, left worse than right.  3/29 TTE LVEF 60-65%, moderate LVH, G1DD,  normal RV, mildly elevated PASP, moderate TR, trivial MR  Resolved Hospital Problem list   Acute hypoxic respiratory failure  Assessment & Plan:  Ruth Gutierrez is a 55 y.o. female in critically stable condition on post-op day 4, hospital day 7 with a relevant PMHx of anuric ESRD (MWF, LUE AVF) s/p failed renal transplant 2010 removed 2017, HTN, and anemia of chronic disease who presented on 3/28 after becoming lethargic at [facility] and was found to have a large anterior skull base tumor. Patient has expected residual bifrontal edema and has otherwise followed a normal postoperative course. Goal will be to wean CRRT and precedex today and transfer patient to the floor given clinical stability.   Large anterior skull base tumor c/w Meningioma w/ significant Bifrontal Edema Post-operative changes as expected with on-track recovery. Dressing removed today.  - s/p resection 3/31 - Started Decadron taper per neurosurgery  - Continue neuro check every hour - Map goal 70-90 - Avoid NSAIDs - Continue zofran prn nausea, hold for QTc > 500       - 3/28 EKG QTc: 477  - Continue keppra for seizure ppx per NSGY - Follow-up surgical pathology  Agitation  Acute hyperactive delirium Suspected to be 2/2 frontal tumor and steroid therapy. Patient interactive and engaged this morning w/out signs of waxing/waning focus or agitation.  - Wean precedex today  - Seroquel increased to BID  - 3/28 EKG QTc: 477   HTN - MAP parameters as above, goal 70-90, continue as needed labetalol - Still on low-dose clevidipine - Continue home dose amlodipine and Coreg  ESRD  - Currently on CRRT, try to wean today  - Home regimen: iHD MWF   Hypoglycemia Patient had a single episode of hypoglcemia (40) this morning that was treated with dextrose and OJ. No precipitating factors, except patient has had minimal PO intake. Likely a/w HA patient reported. - Encouraged PO intake  - CTM  Secondary hyperparathyroidism,  hyperphosphatemia - CTM phosphorus, currently stable at 3.7 > 4.1 - Corrected Ca stable: 8.3 > 8.9 - Continue sensipar and hecerol per Nephrology  Anemia of chronic disease with ABLA Patient noted to have bleeding from AVF on admit c/b AICD. - s/p 1 unit PRBC 3/31 for Hgb 6.5 > 9.2 > 8.5 (4/4)    - Recent baseline appears to be ~9 - Monitor H&H, transfuse if less than 7 - Mircera q 2 weeks/ iron weekly  Tobacco abuse - Counseling provided regarding quit smoking  Best practice (right click and "Reselect all SmartList Selections" daily)  Diet: Renal diet Pain/Anxiety/Delirium protocol (if indicated): Yes on Precedex VAP protocol (if indicated): Not indicated DVT prophylaxis: SCD GI prophylaxis: PPI Glucose control:  SSI No, consider if > 180 Central venous access:  N/A Arterial line:  Yes, not needed Foley:  N/A Mobility:  bed rest  PT consulted: N/A Last date of multidisciplinary goals of care discussion Code Status:  full code Disposition: Neuro ICU  Labs   CBC: Recent Labs  Lab 10/29/20 0944 10/29/20 1031 10/30/20 1704 10/31/20 0424 10/31/20 1839 11/01/20 0945 11/01/20 1641 11/01/20 1818 11/01/20 1925 11/02/20 0500  WBC 10.4   < > 8.5 8.2 11.5* 10.1  --   --   --  14.2*  NEUTROABS  8.9*  --   --  7.1  --   --   --   --   --  11.2*  HGB 9.2*   < > 7.7* 7.8* 8.4* 8.3* 6.5* 9.5* 9.2* 10.3*  HCT 28.8*   < > 23.0* 23.3* 25.9* 26.4* 19.0* 28.0* 27.0* 29.9*  MCV 96.6   < > 94.3 94.0 95.2 97.4  --   --   --  93.1  PLT 137*   < > 103* 107* 155 141*  --   --   --  206   < > = values in this interval not displayed.    Basic Metabolic Panel: Recent Labs  Lab 10/29/20 1524 10/30/20 0302 10/31/20 0932 10/31/20 1839 11/03/20 0500 11/03/20 1550 11/04/20 0540 11/04/20 1543 11/05/20 0556  NA  --    < >  --    < > 134* 136 135 135 135  K  --    < >  --    < > 5.1 5.0 5.1 4.7 4.7  CL  --    < >  --    < > 100 104 104 104 104  CO2  --    < >  --    < > 24 22 24 24 24    GLUCOSE  --    < >  --    < > 147* 126* 115* 247* 91  BUN  --    < >  --    < > 43* 31* 25* 23* 26*  CREATININE  --    < >  --    < > 5.05* 3.65* 2.94* 2.96* 3.03*  CALCIUM  --    < >  --    < > 7.5* 7.9* 7.6* 7.0* 7.7*  MG 2.7*  --  2.5*  --  2.6*  --  2.6*  --  2.7*  PHOS  --    < >  --    < > 6.7* 5.9* 4.8* 3.7 4.1   < > = values in this interval not displayed.   GFR: CrCl cannot be calculated (Unknown ideal weight.). Recent Labs  Lab 10/29/20 0944 10/29/20 1502 10/29/20 1522 10/30/20 0302 10/31/20 0424 10/31/20 1839 11/01/20 0945 11/02/20 0500  PROCALCITON  --  0.79  --   --   --   --   --   --   WBC 10.4  --   --    < > 8.2 11.5* 10.1 14.2*  LATICACIDVEN 2.4*  --  1.1  --   --   --   --   --    < > = values in this interval not displayed.    Liver Function Tests: Recent Labs  Lab 10/29/20 0944 10/30/20 0302 11/03/20 0500 11/03/20 1550 11/04/20 0540 11/04/20 1543 11/05/20 0556  AST 30  --   --   --   --   --   --   ALT 20  --   --   --   --   --   --   ALKPHOS 256*  --   --   --   --   --   --   BILITOT 1.2  --   --   --   --   --   --   PROT 6.7  --   --   --   --   --   --   ALBUMIN 3.3*   < > 2.7* 2.5* 2.5* 2.4* 2.5*   < > =  values in this interval not displayed.   No results for input(s): LIPASE, AMYLASE in the last 168 hours. Recent Labs  Lab 10/29/20 1155  AMMONIA 16    ABG    Component Value Date/Time   PHART 7.437 11/01/2020 1925   PCO2ART 33.9 11/01/2020 1925   PO2ART 102 11/01/2020 1925   HCO3 22.9 11/01/2020 1925   TCO2 24 11/01/2020 1925   ACIDBASEDEF 1.0 11/01/2020 1925   O2SAT 98.0 11/01/2020 1925     EKG Interpretation  Date/Time:  Monday October 29 2020 09:41:08 EDT Ventricular Rate:  87 PR Interval:    QRS Duration: 88 QT Interval:  396 QTC Calculation: 477 R Axis:   6 Text Interpretation: Sinus rhythm Probable left atrial enlargement Abnormal R-wave progression, early transition Borderline T wave abnormalities No  significant change since last tracing Confirmed by Deno Etienne [phone]) on 10/29/2020 10:03:06 AM    Total critical care time: 35 minutes  Domenick Bookbinder, MS4

## 2020-11-06 DIAGNOSIS — Z86011 Personal history of benign neoplasm of the brain: Secondary | ICD-10-CM

## 2020-11-06 DIAGNOSIS — F05 Delirium due to known physiological condition: Secondary | ICD-10-CM

## 2020-11-06 DIAGNOSIS — T8612 Kidney transplant failure: Secondary | ICD-10-CM

## 2020-11-06 DIAGNOSIS — G936 Cerebral edema: Secondary | ICD-10-CM

## 2020-11-06 LAB — GLUCOSE, CAPILLARY
Glucose-Capillary: 121 mg/dL — ABNORMAL HIGH (ref 70–99)
Glucose-Capillary: 99 mg/dL (ref 70–99)
Glucose-Capillary: 171 mg/dL — ABNORMAL HIGH (ref 70–99)
Glucose-Capillary: 119 mg/dL — ABNORMAL HIGH (ref 70–99)
Glucose-Capillary: 147 mg/dL — ABNORMAL HIGH (ref 70–99)

## 2020-11-06 LAB — CBC
HCT: 25.6 % — ABNORMAL LOW (ref 36.0–46.0)
Hemoglobin: 8.2 g/dL — ABNORMAL LOW (ref 12.0–15.0)
MCH: 31.1 pg (ref 26.0–34.0)
MCHC: 32 g/dL (ref 30.0–36.0)
MCV: 97 fL (ref 80.0–100.0)
Platelets: 147 10*3/uL — ABNORMAL LOW (ref 150–400)
RBC: 2.64 MIL/uL — ABNORMAL LOW (ref 3.87–5.11)
RDW: 15.6 % — ABNORMAL HIGH (ref 11.5–15.5)
WBC: 11.7 10*3/uL — ABNORMAL HIGH (ref 4.0–10.5)
nRBC: 0 % (ref 0.0–0.2)

## 2020-11-06 LAB — BASIC METABOLIC PANEL
Anion gap: 9 (ref 5–15)
BUN: 47 mg/dL — ABNORMAL HIGH (ref 6–20)
CO2: 23 mmol/L (ref 22–32)
Calcium: 8.2 mg/dL — ABNORMAL LOW (ref 8.9–10.3)
Chloride: 103 mmol/L (ref 98–111)
Creatinine, Ser: 5.86 mg/dL — ABNORMAL HIGH (ref 0.44–1.00)
GFR, Estimated: 8 mL/min — ABNORMAL LOW (ref >60)
Glucose, Bld: 140 mg/dL — ABNORMAL HIGH (ref 70–99)
Potassium: 5 mmol/L (ref 3.5–5.1)
Sodium: 135 mmol/L (ref 135–145)

## 2020-11-06 MED ORDER — POLYETHYLENE GLYCOL 3350 17 G PO PACK
17.0000 g | PACK | Freq: Two times a day (BID) | ORAL | Status: DC
  Administered 2020-11-06 – 2020-11-08 (×2): 17 g via ORAL
  Filled 2020-11-06 (×5): qty 1

## 2020-11-06 MED ORDER — SENNOSIDES-DOCUSATE SODIUM 8.6-50 MG PO TABS
2.0000 | ORAL_TABLET | Freq: Two times a day (BID) | ORAL | Status: DC
  Administered 2020-11-06 – 2020-11-09 (×2): 2 via ORAL
  Filled 2020-11-06 (×6): qty 2

## 2020-11-06 NOTE — Progress Notes (Signed)
Subjective:   ICU transfer summary: Ruth Gutierrez is a 55 y/o female with ESRD on HD M/W/F who was found to have a large anterior skull base tumor with significant vasogenic edema. She was started on steroids and underwent surgical resection on 3/31. She was transferred to the ICU for post-operative management. Due to concern for osmotic shifts in the setting of her large tumor with edema, CRRT was initiated on 4/1 which was successfully discontinued today. Per neurosurgery, MAP goal is between 70-90. This was initially done with low dose cleviprex infusion which was weaned and is now being managed with PO meds and prn IV labetalol. She has made good progress clinically and was transferred back to IMTS for further care.   Patient was eating lunch during examination.  States that she is doing well, denies change in vision, double vision, focal weakness, chest pain or shortness of breath.  States that she had headache yesterday but improved significantly today.  Endorses mild tingling sensation of her face after the surgery.  Patient has not had a bowel movements since before the surgery.  Objective:  Vital signs in last 24 hours: Vitals:   11/06/20 0900 11/06/20 1000 11/06/20 1100 11/06/20 1200  BP: (!) 149/82 (!) 153/72 (!) 144/74 110/60  Pulse: 91 81 84 78  Resp: 16 15 (!) 22 15  Temp:    98.7 F (37.1 C)  TempSrc:    Oral  SpO2: 98% 97% 98% 97%  Weight:       Physical Exam Constitutional:      General: She is not in acute distress. Eyes:     General:        Right eye: No discharge.        Left eye: No discharge.  Cardiovascular:     Rate and Rhythm: Normal rate and regular rhythm.  Pulmonary:     Effort: Pulmonary effort is normal. No respiratory distress.  Musculoskeletal:        General: Normal range of motion.  Skin:    General: Skin is warm.  Neurological:     Mental Status: She is alert.     Comments: Cranial nerves none deficit.  Sensation intact.  5/5 strength of  bilateral LE and UE extremities  Psychiatric:        Mood and Affect: Mood normal.     Assessment/Plan:  Principal Problem:   Encephalopathy Active Problems:   Hypertension   ESRD on hemodialysis (HCC)   Type 2 diabetes mellitus with diabetic chronic kidney disease [facility])  Patient is a 55 year old female with past medical history of ESRD status post failed renal transplant 2010 and removed 2017, who was admitted for an acute encephalopathy, found to have a large olfactory groove anterior skull base meningioma with severe bifrontal brain swelling.  Status post tumor excision on 3/31.  Large anterior skull base meningioma s/p resection 3/31 Bifrontal brain swelling Patient is alert, awake and oriented during examination.  Her neuro exam was unremarkable.  No change in vision.  Repeat MRI brain on 4/3 showed stable vasogenic edema and decreased mass-effect. -Appreciate neurosurgery recommendation -Continue Decadron taper: 2 mg BID > 1 mg BID (4/7) > 1 mg daily (4/10) -Goal MAP 70 - 100 per neurosurgery -Continue Keppra for 14 days postop -PT/OT  ESRD status post failed renal transplant Anemia of chronic disease (s/p 1 u pRBC 3/31) Secondary hyperparathyroidism  Patient was started on CRRT in the ICU and will be transition to regular HD.  Next HD session  tomorrow with left AV fistula.  -Appreciate nephrology recommendation -HD schedule MWF.  Will have HD tomorrow -Receive EPO weekly with dialysis -Continue Sensipar, hectorol and velphoro per Nephro.    Hypertension Goal MAP 70-100. BP 123/70 with BP cuff seen during examination. A-line BP however significantly elevated, unsure if it was recaliberated. Will remove A-line when she is transferred out of ICU.  -Continue amlodipine 10 mg -Continue Coreg 6.25 mg BID -Continue Clonidine 0.2 mg TID -Labetalol 10 mg PRN    Hyperactive delirium  In the setting intracranial meningoma and steroid use. Patient was on Precedex drip and  discontinued this morning.  -Continue Seroquel 100 mg BID -Tapering Decadron -can give Ativan as needed for agitation  Diet: renal DVT: SCDs CODE: full   Prior to Admission Living Arrangement: home Anticipated Discharge Location: pending PT/OT recs Barriers to Discharge: medical treatment Dispo: Anticipated discharge in approximately >3 day(s).   Gaylan Gerold, DO 11/06/2020, 1:41 PM Pager: [phone] After 5pm on weekdays and 1pm on weekends: On Call pager [phone]

## 2020-11-06 NOTE — Progress Notes (Signed)
Subjective: Patient reports decreased HA today  Objective: Vital signs in last 24 hours: Temp:  [98.4 F (36.9 C)-100.7 F (38.2 C)] 98.9 F (37.2 C) (04/05 0800) Pulse Rate:  [71-104] 81 (04/05 1000) Resp:  [11-21] 15 (04/05 1000) BP: (112-191)/(61-96) 153/72 (04/05 1000) SpO2:  [93 %-100 %] 97 % (04/05 1000) Arterial Line BP: (119-218)/(52-92) 178/87 (04/05 0700)  Intake/Output from previous day: 04/04 0701 - 04/05 0700 In: 1131.3 [P.O.:880; I.V.:251.3] Out: 216  Intake/Output this shift: Total I/O In: 10 [I.V.:10] Out: -  Alert, Ox3. NAD R>L periorbital swelling No pronator drift Appropriate speech Dressing intact  Lab Results: Recent Labs    11/05/20 0903 11/06/20 0840  WBC 11.4* 11.7*  HGB 8.5* 8.2*  HCT 26.3* 25.6*  PLT 140* 147*   BMET Recent Labs    11/05/20 0556 11/06/20 0840  NA 135 135  K 4.7 5.0  CL 104 103  CO2 24 23  GLUCOSE 91 140*  BUN 26* 47*  CREATININE 3.03* 5.86*  CALCIUM 7.7* 8.2*    Studies/Results: MR BRAIN WO CONTRAST  Result Date: 11/04/2020 CLINICAL DATA:  Status post resection of meningioma. EXAM: MRI HEAD WITHOUT CONTRAST TECHNIQUE: Multiplanar, multiecho pulse sequences of the brain and surrounding structures were obtained without intravenous contrast. COMPARISON:  MR head without contrast 10/29/2020 FINDINGS: Brain: Bifrontal craniotomy noted. Planum sphenoidale meningioma is grossly resected. Blood products are present within the resection cavity. Surrounding vasogenic edema stable with decreased mass effect. Ventricles partially re-expanded. Restricted diffusion noted in the anterior frontal lobes bilaterally. Basal ganglia within normal limits bilaterally. The brainstem and cerebellum are within normal limits. Vascular: Flow is present in the major intracranial arteries. Skull and upper cervical spine: Craniocervical junction within normal limits. Diffuse decreased marrow signal noted. Discrete lesions present. Sinuses/Orbits:  Ethmoid frontal sinus is opacified. Bilateral mastoid effusions noted. Globes and orbits are unremarkable. IMPRESSION: 1. Bifrontal craniotomy for resection of meningioma. 2. Stable vasogenic edema and decreased mass effect. 3. Restricted diffusion in the anterior frontal lobes bilaterally concerning for infarct. 4. Blood products within the resection cavity. 5. Diffuse decreased marrow signal. This is likely related to anemia. Electronically Signed   By: San Morelle M.D.   On: 11/04/2020 21:22    Assessment/Plan: S/p bifrontal crani for skull base tumor - downgrade today - steroid taper - continue Keppra x 14 days postop - MAP goal 70-100 - PT/OT, mobilize - SNF vs rehab - SCDs for DVT PPx as patient at elevated risk of ICH given platelet dysfunction and thrombocytopenia   Ruth Gutierrez 11/06/2020, 10:49 AM

## 2020-11-06 NOTE — Progress Notes (Signed)
[facility] Progress Note  Subjective: pt seen in ICU, stable, responsive  Vitals:   11/06/20 0700 11/06/20 0800 11/06/20 0900 11/06/20 1000  BP: (!) 165/87 (!) 183/96 (!) 149/82 (!) 153/72  Pulse: 84 99 91 81  Resp: 14 11 16 15   Temp:  98.9 F (37.2 C)    TempSrc:  Oral    SpO2: 96% 98% 98% 97%  Weight:        Exam:   alert, nad , head dressing  R facial edema better  no jvd  Chest cta bilat  Cor reg no RG  Abd soft ntnd no ascites   Ext no LE edema   Alert, NF, ox3   AVF+bruit     OP HD: MWF  4h  400/500  73.5kg  2/2 bath  AVF  No heparin (post craniotomy)   - sensipar 180 mwf  - hect 5 ug mwf  - iron 50 weekly  - mircera 75 q2 due 4/4   Assessment/ Plan: 1. AMS/Intracranial mass: sp excision of meningioma on 3/311 2. ESRD:MWF HD. Sp CRRT 4/1 - 4/4. Plan for gentle regular HD tomorrow. UF will be low, she is only up 2kg. Reassured family that regular HD at this point is safe. No heparin for 10-14 days. Will have temp HD cath removed.   3. Volume/ hypertension: as above, stable exam 4. Anemia ckd: Darbepoetin ordered for wed 100 ug weekly 5. Secondary Hyperparathyroidism/Hyperphosphatemia:Continue home sensipar 180mg  and hecterol 57mcg w/ dialysis. On velphoro 1000 w/ meals. 6. Vascular access:LUE AVF w/ thrill and pulsatility   Ruth Gutierrez 11/06/2020, 10:53 AM     Recent Labs  Lab 11/04/20 1543 11/05/20 0556 11/05/20 0903 11/06/20 0840  K 4.7 4.7  --  5.0  BUN 23* 26*  --  47*  CREATININE 2.96* 3.03*  --  5.86*  CALCIUM 7.0* 7.7*  --  8.2*  PHOS 3.7 4.1  --   --   HGB  --   --  8.5* 8.2*   Inpatient medications: . amLODipine  10 mg Oral Daily  . carvedilol  6.25 mg Oral BID WC  . Chlorhexidine Gluconate Cloth  6 each Topical Q0600  . cinacalcet  180 mg Oral Q M,W,F  . cloNIDine  0.2 mg Oral TID  . darbepoetin (ARANESP) injection - DIALYSIS  100 mcg Intravenous Q Mon-HD  . dexamethasone  2 mg Oral Q12H   Followed by  .  [START ON 11/08/2020] dexamethasone  1 mg Oral Q12H   Followed by  . [START ON 11/11/2020] dexamethasone  1 mg Oral Daily  . docusate sodium  100 mg Oral BID  . doxercalciferol  5 mcg Oral Once per day on Mon Wed Fri  . levETIRAcetam  250 mg Oral BID  . lidocaine-prilocaine   Topical Q M,W,F-HD  . oxymetazoline  1 spray Each Nare BID  . pantoprazole  40 mg Oral Daily  . QUEtiapine  100 mg Oral BID  . sodium chloride flush  10-40 mL Intracatheter Q12H  . sucroferric oxyhydroxide  1,000 mg Oral TID WC   . sodium chloride    . sodium chloride    . sodium chloride Stopped (11/05/20 2127)  . clevidipine Stopped (11/05/20 0911)  . dexmedetomidine (PRECEDEX) IV infusion Stopped (11/05/20 0837)   sodium chloride, sodium chloride, acetaminophen **OR** acetaminophen, alteplase, enalaprilat, heparin, labetalol, lidocaine (PF), lidocaine-prilocaine, menthol-cetylpyridinium, ondansetron **OR** ondansetron (ZOFRAN) IV, oxyCODONE, pentafluoroprop-tetrafluoroeth, promethazine, sodium chloride flush

## 2020-11-07 ENCOUNTER — Encounter (HOSPITAL_COMMUNITY): Payer: Self-pay | Admitting: Internal Medicine

## 2020-11-07 LAB — GLUCOSE, CAPILLARY
Glucose-Capillary: 125 mg/dL — ABNORMAL HIGH (ref 70–99)
Glucose-Capillary: 135 mg/dL — ABNORMAL HIGH (ref 70–99)
Glucose-Capillary: 141 mg/dL — ABNORMAL HIGH (ref 70–99)
Glucose-Capillary: 145 mg/dL — ABNORMAL HIGH (ref 70–99)
Glucose-Capillary: 161 mg/dL — ABNORMAL HIGH (ref 70–99)
Glucose-Capillary: 542 mg/dL (ref 70–99)
Glucose-Capillary: 90 mg/dL (ref 70–99)

## 2020-11-07 LAB — BASIC METABOLIC PANEL
Anion gap: 13 (ref 5–15)
BUN: 61 mg/dL — ABNORMAL HIGH (ref 6–20)
CO2: 20 mmol/L — ABNORMAL LOW (ref 22–32)
Calcium: 8.5 mg/dL — ABNORMAL LOW (ref 8.9–10.3)
Chloride: 105 mmol/L (ref 98–111)
Creatinine, Ser: 7.85 mg/dL — ABNORMAL HIGH (ref 0.44–1.00)
GFR, Estimated: 6 mL/min — ABNORMAL LOW (ref >60)
Glucose, Bld: 125 mg/dL — ABNORMAL HIGH (ref 70–99)
Potassium: 5.6 mmol/L — ABNORMAL HIGH (ref 3.5–5.1)
Sodium: 138 mmol/L (ref 135–145)

## 2020-11-07 LAB — CBC
HCT: 23.9 % — ABNORMAL LOW (ref 36.0–46.0)
Hemoglobin: 7.7 g/dL — ABNORMAL LOW (ref 12.0–15.0)
MCH: 31.4 pg (ref 26.0–34.0)
MCHC: 32.2 g/dL (ref 30.0–36.0)
MCV: 97.6 fL (ref 80.0–100.0)
Platelets: 137 10*3/uL — ABNORMAL LOW (ref 150–400)
RBC: 2.45 MIL/uL — ABNORMAL LOW (ref 3.87–5.11)
RDW: 15.3 % (ref 11.5–15.5)
WBC: 10 10*3/uL (ref 4.0–10.5)
nRBC: 0 % (ref 0.0–0.2)

## 2020-11-07 MED ORDER — LABETALOL HCL 5 MG/ML IV SOLN
10.0000 mg | INTRAVENOUS | Status: DC | PRN
  Administered 2020-11-07 (×4): 10 mg via INTRAVENOUS
  Filled 2020-11-07 (×4): qty 4

## 2020-11-07 MED ORDER — LABETALOL HCL 100 MG PO TABS
100.0000 mg | ORAL_TABLET | Freq: Two times a day (BID) | ORAL | Status: DC
  Administered 2020-11-07: 100 mg via ORAL
  Filled 2020-11-07: qty 1

## 2020-11-07 MED ORDER — DARBEPOETIN ALFA 100 MCG/0.5ML IJ SOSY
PREFILLED_SYRINGE | INTRAMUSCULAR | Status: AC
  Administered 2020-11-07: 100 ug via INTRAVENOUS
  Filled 2020-11-07: qty 0.5

## 2020-11-07 MED ORDER — DARBEPOETIN ALFA 100 MCG/0.5ML IJ SOSY
100.0000 ug | PREFILLED_SYRINGE | INTRAMUSCULAR | Status: DC
  Filled 2020-11-07: qty 0.5

## 2020-11-07 NOTE — Progress Notes (Signed)
OT Cancellation Note  Patient Details Name: Ruth Gutierrez MRN: [id number] DOB: [birth year]   Cancelled Treatment:    Reason Eval/Treat Not Completed: Medical issues which prohibited therapy pt with elevated BP supine in bed and RN notified.  Plan for dialysis today and RN reports unable to provide medication at this time.  OT will follow and see as able after dialysis.    Jolaine Artist, OT Acute Rehabilitation Services Pager [phone] Office [phone]   [name] 11/07/2020, 10:58 AM

## 2020-11-07 NOTE — Plan of Care (Signed)

## 2020-11-07 NOTE — Progress Notes (Signed)
Neurosurgery  Pt seen and examined.  NAD, A+Ox3, FCx 4, no drift.  No drainage from nose.  Dressing in place. Periorbital edema receding  - HD today - MAPs 70-100 - PT/OT - will monitor after HD today.  If appears stable tomorrow, she would be dispo ready

## 2020-11-07 NOTE — Evaluation (Signed)
Physical Therapy Evaluation Patient Details Name: Ruth Gutierrez MRN: [id number] DOB: Feb 03, 1966 Today's Date: 11/07/2020   History of Present Illness  Ruth Gutierrez is a 55 y/o female who presented to ED after becoming nonresponsive during HD. Pt was admitted on 10/29/20 with intracranial mass, AMS, and PNA. S/p craniotomy for suspected meningioma on 3/31.    Clinical Impression  Pt received in bed, cooperative and pleasant. Generally supervision to min guard for safety, but mobility limited by MAP. Pt symptomatic upon standing, reporting dizziness that did not subside. BP and MAP elevated in sitting. Attempted to stand in hopes BP and MAP would decrease, but MAP still elevated. Pt returned to supine. Education provided to pt and family regarding precautions surrounding BP and MAP. Pt and family demonstrated understanding. Pt left in bed with all needs met, call bell within reach, bed alarm active, and family present in room. RN aware of status, including blood dripping from detached IV site. Pt would benefit from PT to address balance and increase independency. Will continue to follow acutely.  Sitting BP = 160/77, MAP = 102 Standing at 0 min BP = 135/120, MAP = 127    Follow Up Recommendations Home health PT;Supervision for mobility/OOB;Other (comment) (May progress to outpatient neuro PT)    Equipment Recommendations  None recommended by PT    Recommendations for Other Services       Precautions / Restrictions Precautions Precautions: Fall Precaution Comments: watch BP, including MAP (70-90) Restrictions Weight Bearing Restrictions: No      Mobility  Bed Mobility Overal bed mobility: Needs Assistance Bed Mobility: Supine to Sit;Sit to Supine     Supine to sit: Supervision Sit to supine: Supervision   General bed mobility comments: Supervision for safety, no physical assist given    Transfers Overall transfer level: Needs assistance Equipment used: None Transfers: Sit  to/from Stand Sit to Stand: Min guard         General transfer comment: Min guard for safety, no physical assist given  Ambulation/Gait             General Gait Details: deferred to to increased MAP  Stairs            Wheelchair Mobility    Modified Rankin (Stroke Patients Only)       Balance Overall balance assessment: Needs assistance Sitting-balance support: Feet supported;No upper extremity supported Sitting balance-Leahy Scale: Good Sitting balance - Comments: Able to don socks and march in place in sitting     Standing balance-Leahy Scale: Fair Standing balance comment: Steady in static standing, able to march without AD                             Pertinent Vitals/Pain Pain Assessment: Faces Faces Pain Scale: Hurts little more Pain Location: head Pain Descriptors / Indicators: Aching;Discomfort;Grimacing Pain Intervention(s): Monitored during session;Premedicated before session    Home Living Family/patient expects to be discharged to:: Private residence Living Arrangements: Other relatives Available Help at Discharge: Family;Available 24 hours/day Type of Home: Apartment Home Access: Stairs to enter Entrance Stairs-Rails: Left Entrance Stairs-Number of Steps: 7 Home Layout: One level Home Equipment: None      Prior Function Level of Independence: Independent               Hand Dominance        Extremity/Trunk Assessment   Upper Extremity Assessment Upper Extremity Assessment: Defer to OT evaluation    Lower Extremity  Assessment Lower Extremity Assessment: Overall WFL for tasks assessed       Communication   Communication: No difficulties  Cognition Arousal/Alertness: Awake/alert Behavior During Therapy: WFL for tasks assessed/performed Overall Cognitive Status: Within Functional Limits for tasks assessed                                 General Comments: A&Ox4. Conversing and making jokes,  however when asked questions during history would look to family for assurance      General Comments      Exercises     Assessment/Plan    PT Assessment Patient needs continued PT services  PT Problem List Decreased strength;Decreased mobility;Decreased coordination;Decreased cognition;Decreased activity tolerance;Decreased balance;Decreased knowledge of use of DME;Decreased safety awareness       PT Treatment Interventions DME instruction;Therapeutic exercise;Gait training;Balance training;Stair training;Neuromuscular re-education;Functional mobility training;Therapeutic activities;Patient/family education    PT Goals (Current goals can be found in the Care Plan section)  Acute Rehab PT Goals Patient Stated Goal: get out of bed, no headache PT Goal Formulation: With patient Time For Goal Achievement: 11/21/20 Potential to Achieve Goals: Good    Frequency Min 3X/week   Barriers to discharge        Co-evaluation               AM-PAC PT "6 Clicks" Mobility  Outcome Measure Help needed turning from your back to your side while in a flat bed without using bedrails?: A Little Help needed moving from lying on your back to sitting on the side of a flat bed without using bedrails?: A Little Help needed moving to and from a bed to a chair (including a wheelchair)?: A Little Help needed standing up from a chair using your arms (e.g., wheelchair or bedside chair)?: A Little Help needed to walk in hospital room?: A Little Help needed climbing 3-5 steps with a railing? : A Lot 6 Click Score: 17    End of Session Equipment Utilized During Treatment: Gait belt Activity Tolerance: Other (comment) (Limited by BP) Patient left: in bed;with call bell/phone within reach;with bed alarm set;with family/visitor present Nurse Communication: Mobility status;Other (comment) (Blood around IV, BP/MAP) PT Visit Diagnosis: Unsteadiness on feet (R26.81);Other abnormalities of gait and mobility  (R26.89)    Time:  -      Charges:         Ruth Gutierrez, SPT

## 2020-11-07 NOTE — Progress Notes (Signed)
HD#9 Subjective:   No acute events overnight.   During evaluation at bedside this morning, patient states she is feeling fine, denies headache but notes she recently took pain medicine. Denies new weakness or numbness. States she has been eating and drinking well. Last BM 2 days ago. Daughter Sharice at bedside.   Objective:  Vital signs in last 24 hours: Vitals:   11/07/20 0345 11/07/20 0403 11/07/20 0418 11/07/20 0506  BP: (!) 166/79 (!) 169/85 (!) 168/81 (!) 150/77  Pulse: 75 77 73 73  Resp:  17 18   Temp:      TempSrc:      SpO2:      Weight:       Supplemental O2: Room Air SpO2: 96 % O2 Flow Rate (L/min): 2 L/min   Physical Exam:  Physical Exam Eyes:     General:        Right eye: No discharge.        Left eye: No discharge.  Cardiovascular:     Rate and Rhythm: Normal rate and regular rhythm.     Heart sounds: Murmur (3/6 systolic murmur heard best on the left sternal border) heard.    Pulmonary:     Effort: Pulmonary effort is normal. No respiratory distress.  Skin:    General: Skin is warm.  Neurological:     Mental Status: She is alert.  Psychiatric:        Mood and Affect: Mood normal.     Filed Weights   10/31/20 0021 10/31/20 1426 10/31/20 1710  Weight: 76.7 kg 77.7 kg 75.5 kg     Intake/Output Summary (Last 24 hours) at 11/07/2020 0724 Last data filed at 11/06/2020 2316 Gross per 24 hour  Intake 970 ml  Output --  Net 970 ml   Net IO Since Admission: 874.1 mL [11/07/20 0724]  Pertinent Labs: CBC Latest Ref Rng & Units 11/07/2020 11/06/2020 11/05/2020  WBC 4.0 - 10.5 K/uL 10.0 11.7(H) 11.4(H)  Hemoglobin 12.0 - 15.0 g/dL 7.7(L) 8.2(L) 8.5(L)  Hematocrit 36.0 - 46.0 % 23.9(L) 25.6(L) 26.3(L)  Platelets 150 - 400 K/uL 137(L) 147(L) 140(L)    CMP Latest Ref Rng & Units 11/07/2020 11/06/2020 11/05/2020  Glucose 70 - 99 mg/dL 125(H) 140(H) 91  BUN 6 - 20 mg/dL 61(H) 47(H) 26(H)  Creatinine 0.44 - 1.00 mg/dL 7.85(H) 5.86(H) 3.03(H)  Sodium 135 -  145 mmol/L 138 135 135  Potassium 3.5 - 5.1 mmol/L 5.6(H) 5.0 4.7  Chloride 98 - 111 mmol/L 105 103 104  CO2 22 - 32 mmol/L 20(L) 23 24  Calcium 8.9 - 10.3 mg/dL 8.5(L) 8.2(L) 7.7(L)  Total Protein 6.5 - 8.1 g/dL - - -  Total Bilirubin 0.3 - 1.2 mg/dL - - -  Alkaline Phos 38 - 126 U/L - - -  AST 15 - 41 U/L - - -  ALT 0 - 44 U/L - - -    Imaging: No results found.  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  Status post tumor excision on 3/31.   Large anterior skull base meningioma  s/p resection 3/31 Bifrontal brain swelling Mentation at baseline.  -Appreciate neurosurgery recommendation -Continue Decadron taper: 2 mg BID > 1 mg BID (4/7) > 1 mg daily (4/10) -Goal MAP 70 - 100 per neurosurgery -Continue Keppra for 14 days postop -PT/OT   Hypertension Goal MAP 70-100 per neurosurgery.  Hopefully her blood pressure will improve after HD today.  Tapering steroid doses can also help reduce blood pressure.  If remains elevated, will adjust the medication. -Continue amlodipine 10 mg -Continue Coreg 6.25 mg BID -Continue Clonidine 0.2 mg TID -Labetalol 10 mg PRN    ESRD status post failed renal transplant Anemia of chronic disease (s/p 1 u pRBC 3/31) Secondary hyperparathyroidism  HD schedule MWF.    Will have HD today. -Appreciate nephrology recommendation -Receive EPO weekly with dialysis -Continue Sensipar, hectorol and velphoro per Nephro.    Hyperactive delirium  No episode of agitation or delirium overnight. -Continue  Seroquel 100 mg BID -Tapering Decadron -can give Ativan as needed for agitation  Diet: renal DVT: SCDs CODE: full   Prior to Admission Living Arrangement: home Anticipated Discharge Location: pending PT/OT recs Barriers to Discharge: medical treatment Dispo: Anticipated discharge in approximately >3 day(s).   Gaylan Gerold, DO 11/07/2020, 7:24 AM Pager: [phone]  Please contact the on call pager after 5 pm and on weekends at [phone].

## 2020-11-07 NOTE — Progress Notes (Addendum)
[facility] Progress Note  Subjective: pt seen in room. No c/o today. BP's still high.   Vitals:   11/07/20 0418 11/07/20 0506 11/07/20 0744 11/07/20 1132  BP: (!) 168/81 (!) 150/77 (!) 172/79 (!) 169/70  Pulse: 73 73 73 82  Resp: 18  20 18   Temp:   98.6 F (37 C) 98.3 F (36.8 C)  TempSrc:   Oral Oral  SpO2:   100% 100%  Weight:        Exam:   alert, nad , head dressing  R facial edema better  no jvd  Chest cta bilat  Cor reg no RG  Abd soft ntnd no ascites   Ext no LE edema   Alert, NF, ox3   AVF+bruit     OP HD: MWF  4h  400/500  73.5kg  2/2 bath  AVF  No heparin (post craniotomy)   - sensipar 180 mwf  - hect 5 ug mwf  - iron 50 weekly  - mircera 75 q2 due 4/4   Assessment/ Plan: 1. AMS/Intracranial mass: sp excision of meningioma on 3/31.  2. ESRD:MWF HD. Sp CRRT 4/1 - 4/4. Plan for gentle HD today w/ UF 2-2.5 L goal. No heparin for 10-14 days. Temp HD cath removed.   3. Volume/ hypertension: BP's not controlled as of yet, on additional meds here (x3) vs at home (x1). No edema on exam. 2kg up. Try UF 2.5 L today on HD.  4. Anemia ckd: Darbepoetin ordered for wed 100 ug weekly 5. Secondary Hyperparathyroidism/Hyperphosphatemia:Continue home sensipar 180mg  and hectorol 61mcg w/ dialysis. On velphoro 1000 w/ meals. 6. Vascular access:LUE AVF w/ thrill and pulsatility   Rob Berwyn Bigley 11/07/2020, 12:13 PM     Recent Labs  Lab 11/04/20 1543 11/05/20 0556 11/05/20 0903 11/06/20 0840 11/07/20 0326 11/07/20 0422  K 4.7 4.7  --  5.0 5.6*  --   BUN 23* 26*  --  47* 61*  --   CREATININE 2.96* 3.03*  --  5.86* 7.85*  --   CALCIUM 7.0* 7.7*  --  8.2* 8.5*  --   PHOS 3.7 4.1  --   --   --   --   HGB  --   --    < > 8.2*  --  7.7*   < > = values in this interval not displayed.   Inpatient medications: . amLODipine  10 mg Oral Daily  . carvedilol  6.25 mg Oral BID WC  . Chlorhexidine Gluconate Cloth  6 each Topical Q0600  . cinacalcet  180  mg Oral Q M,W,F  . cloNIDine  0.2 mg Oral TID  . darbepoetin (ARANESP) injection - DIALYSIS  100 mcg Intravenous Q Mon-HD  . dexamethasone  2 mg Oral Q12H   Followed by  . [START ON 11/08/2020] dexamethasone  1 mg Oral Q12H   Followed by  . [START ON 11/11/2020] dexamethasone  1 mg Oral Daily  . docusate sodium  100 mg Oral BID  . doxercalciferol  5 mcg Oral Once per day on Mon Wed Fri  . levETIRAcetam  250 mg Oral BID  . lidocaine-prilocaine   Topical Q M,W,F-HD  . oxymetazoline  1 spray Each Nare BID  . pantoprazole  40 mg Oral Daily  . polyethylene glycol  17 g Oral BID  . QUEtiapine  100 mg Oral BID  . senna-docusate  2 tablet Oral BID  . sodium chloride flush  10-40 mL Intracatheter Q12H  . sucroferric oxyhydroxide  1,000 mg Oral TID WC   . sodium chloride    . sodium chloride    . sodium chloride Stopped (11/05/20 2127)   sodium chloride, sodium chloride, acetaminophen **OR** acetaminophen, alteplase, enalaprilat, heparin, labetalol, lidocaine (PF), lidocaine-prilocaine, menthol-cetylpyridinium, ondansetron **OR** ondansetron (ZOFRAN) IV, oxyCODONE, pentafluoroprop-tetrafluoroeth, promethazine, sodium chloride flush

## 2020-11-08 LAB — CBC
HCT: 26.5 % — ABNORMAL LOW (ref 36.0–46.0)
Hemoglobin: 8.5 g/dL — ABNORMAL LOW (ref 12.0–15.0)
MCH: 30.8 pg (ref 26.0–34.0)
MCHC: 32.1 g/dL (ref 30.0–36.0)
MCV: 96 fL (ref 80.0–100.0)
Platelets: 142 10*3/uL — ABNORMAL LOW (ref 150–400)
RBC: 2.76 MIL/uL — ABNORMAL LOW (ref 3.87–5.11)
RDW: 15.2 % (ref 11.5–15.5)
WBC: 9.3 10*3/uL (ref 4.0–10.5)
nRBC: 0 % (ref 0.0–0.2)

## 2020-11-08 LAB — GLUCOSE, CAPILLARY
Glucose-Capillary: 120 mg/dL — ABNORMAL HIGH (ref 70–99)
Glucose-Capillary: 105 mg/dL — ABNORMAL HIGH (ref 70–99)
Glucose-Capillary: 136 mg/dL — ABNORMAL HIGH (ref 70–99)
Glucose-Capillary: 124 mg/dL — ABNORMAL HIGH (ref 70–99)
Glucose-Capillary: 116 mg/dL — ABNORMAL HIGH (ref 70–99)

## 2020-11-08 LAB — BASIC METABOLIC PANEL
Anion gap: 11 (ref 5–15)
BUN: 25 mg/dL — ABNORMAL HIGH (ref 6–20)
CO2: 27 mmol/L (ref 22–32)
Calcium: 8.9 mg/dL (ref 8.9–10.3)
Chloride: 98 mmol/L (ref 98–111)
Creatinine, Ser: 4.94 mg/dL — ABNORMAL HIGH (ref 0.44–1.00)
GFR, Estimated: 10 mL/min — ABNORMAL LOW (ref >60)
Glucose, Bld: 118 mg/dL — ABNORMAL HIGH (ref 70–99)
Potassium: 5.4 mmol/L — ABNORMAL HIGH (ref 3.5–5.1)
Sodium: 136 mmol/L (ref 135–145)

## 2020-11-08 MED ORDER — CARVEDILOL 12.5 MG PO TABS
12.5000 mg | ORAL_TABLET | Freq: Two times a day (BID) | ORAL | Status: DC
  Administered 2020-11-08 – 2020-11-09 (×3): 12.5 mg via ORAL
  Filled 2020-11-08 (×3): qty 1

## 2020-11-08 MED ORDER — SODIUM ZIRCONIUM CYCLOSILICATE 10 G PO PACK
10.0000 g | PACK | Freq: Once | ORAL | Status: AC
  Administered 2020-11-08: 10 g via ORAL
  Filled 2020-11-08: qty 1

## 2020-11-08 NOTE — Progress Notes (Signed)
[facility] Progress Note   Subjective:  Seen in room - mild headache but just took tylenol. BP improved while on HD yesterday - up again today. She reports some mild cramping after HD yesterday. No CP or dyspnea.  Objective Vitals:   11/07/20 2012 11/07/20 2353 11/08/20 0338 11/08/20 0750  BP: (!) 166/80 (!) 144/80 (!) 184/87 (!) 183/93  Pulse: 76 71 71 70  Resp: 15 16  16   Temp: 98.3 F (36.8 C) 98.2 F (36.8 C) 98.4 F (36.9 C) 98.6 F (37 C)  TempSrc: Oral Oral Oral Oral  SpO2: 99% 97% 98% 98%  Weight:      Height:       Physical Exam General: Well appearing woman, NAD. Room air. Scalp staples in place along anterior hairline, facial edema improved Heart: RRR; no murmur Lungs: CTAB; no rales Abdomen: soft, non-tender Extremities: No LE edema Dialysis Access:  AVF + bruit  Additional Objective Labs: Basic Metabolic Panel: Recent Labs  Lab 11/04/20 0540 11/04/20 1543 11/05/20 0556 11/06/20 0840 11/07/20 0326 11/08/20 0348  NA 135 135 135 135 138 136  K 5.1 4.7 4.7 5.0 5.6* 5.4*  CL 104 104 104 103 105 98  CO2 24 24 24 23  20* 27  GLUCOSE 115* 247* 91 140* 125* 118*  BUN 25* 23* 26* 47* 61* 25*  CREATININE 2.94* 2.96* 3.03* 5.86* 7.85* 4.94*  CALCIUM 7.6* 7.0* 7.7* 8.2* 8.5* 8.9  PHOS 4.8* 3.7 4.1  --   --   --    Liver Function Tests: Recent Labs  Lab 11/04/20 0540 11/04/20 1543 11/05/20 0556  ALBUMIN 2.5* 2.4* 2.5*   CBC: Recent Labs  Lab 11/02/20 0500 11/05/20 0903 11/06/20 0840 11/07/20 0422 11/08/20 0627  WBC 14.2* 11.4* 11.7* 10.0 9.3  NEUTROABS 11.2* 8.8*  --   --   --   HGB 10.3* 8.5* 8.2* 7.7* 8.5*  HCT 29.9* 26.3* 25.6* 23.9* 26.5*  MCV 93.1 96.3 97.0 97.6 96.0  PLT 206 140* 147* 137* 142*   Medications: . sodium chloride Stopped (11/05/20 2127)   . amLODipine  10 mg Oral Daily  . carvedilol  12.5 mg Oral BID WC  . Chlorhexidine Gluconate Cloth  6 each Topical Q0600  . cinacalcet  180 mg Oral Q M,W,F  .  cloNIDine  0.2 mg Oral TID  . darbepoetin (ARANESP) injection - DIALYSIS  100 mcg Intravenous Q Wed-HD  . dexamethasone  1 mg Oral Q12H   Followed by  . [START ON 11/11/2020] dexamethasone  1 mg Oral Daily  . docusate sodium  100 mg Oral BID  . doxercalciferol  5 mcg Oral Once per day on Mon Wed Fri  . levETIRAcetam  250 mg Oral BID  . lidocaine-prilocaine   Topical Q M,W,F-HD  . oxymetazoline  1 spray Each Nare BID  . pantoprazole  40 mg Oral Daily  . polyethylene glycol  17 g Oral BID  . QUEtiapine  100 mg Oral BID  . senna-docusate  2 tablet Oral BID  . sodium chloride flush  10-40 mL Intracatheter Q12H  . sucroferric oxyhydroxide  1,000 mg Oral TID WC    Dialysis Orders: [facility]WF [facility]   4h  400/500  73.5kg  2/2 bath  AVF no heparin (post craniotomy)   - sensipar 180 mwf  - hect 5 ug mwf  - iron 50 weekly  - mircera 75 q2 due 4/4  Assessment/Plan: 1. AMS/Olfactory groove meningioma: S/p excision 3/31. Per neurosurgery. 2. ESRD:S/p CRRT  4/1 - 4/4, now back to intermittent HD on MWF schedule. No heparin for 10-14 days. Next HD 4/8. K 5.4 today - will give dose Lokelma 10mg  to hold her over. 3. Volume/ hypertension:BP still high despite adding 2 additional BP meds to home regimen (n additional meds here (x3) vs at home (x1). No edema on exam and cramping after last HD. Steroids being tapered which may help. 4. Anemia of ESRD:Hgb 8.5 - continue Aranesp 180mcg q Wed 5. Secondary Hyperparathyroidism/Hyperphosphatemia:Continue home sensipar 180mg  and hectorol 47mcg w/ dialysis. On velphoro 1000 w/ meals. 6. Vascular access:LUE AVF w/ thrill and pulsatility   Veneta Penton, PA-C 11/08/2020, 9:58 AM  [facility]

## 2020-11-08 NOTE — Progress Notes (Signed)
Neurosurgery  Minimal HA.  Tolerated dialysis.  A+Ox3, FC x4.  Dressing removed.  Periorbital swelling improved.  S/p resection of olfactory groove meningioma - likely home with PT soon - cont Dec taper

## 2020-11-08 NOTE — Progress Notes (Signed)
Physical Therapy Treatment Patient Details Name: Ruth Gutierrez MRN: [id number] DOB: 1965-12-19 Today's Date: 11/08/2020    History of Present Illness Ruth Gutierrez is a 55 y/o female who presented to ED after becoming nonresponsive during HD. Pt was admitted on 10/29/20 with intracranial mass, AMS, and PNA. S/p craniotomy for suspected meningioma on 3/31. PMH of HTN, ESRD on HD, arthritis, kidney transplant    PT Comments    Pt making good progress this afternoon.  BP remained within parameters and pt able to ambulate and perform steps with min guard.  She did demonstrate balance deficits in standing and with gait.  Gait improved with use of rollator.  Very motivated and with good rehab potential.     Follow Up Recommendations  Home health PT;Supervision for mobility/OOB (possible progression to outpt neuro PT)     Equipment Recommendations  Other (comment) (rollator)    Recommendations for Other Services       Precautions / Restrictions Precautions Precautions: Fall Precaution Comments: BP parameters: 57-322 systolic, 02-542 diastolic, 70-623 MAP    Mobility  Bed Mobility Overal bed mobility: Needs Assistance Bed Mobility: Supine to Sit;Sit to Supine     Supine to sit: Supervision;HOB elevated Sit to supine: Supervision;HOB elevated   General bed mobility comments: Supervision for safety, no physical assist given    Transfers Overall transfer level: Needs assistance Equipment used: None Transfers: Sit to/from Stand Sit to Stand: Min guard         General transfer comment: Min guard safety; performed x 5 during session  Ambulation/Gait Ambulation/Gait assistance: Min guard;Min assist Gait Distance (Feet): 180 Feet Assistive device: Rolling walker (2 wheeled);1 person hand held assist;4-wheeled walker Gait Pattern/deviations: Step-through pattern;Decreased stride length;Drifts right/left Gait velocity: decreased   General Gait Details: With HHA pt requiring min  A for balance.  Tried RW and rollator - pt demonstrating smoother gait pattern and stability with rollator.  She also preferred rollator due to ease of use and ability to take rest breaks   Stairs Stairs: Yes Stairs assistance: Min guard Stair Management: Two rails;Alternating pattern;Forwards Number of Stairs: 2 General stair comments: min guard for safety   Wheelchair Mobility    Modified Rankin (Stroke Patients Only)       Balance Overall balance assessment: Needs assistance Sitting-balance support: Feet supported;No upper extremity supported Sitting balance-Leahy Scale: Good     Standing balance support: No upper extremity supported;During functional activity;Bilateral upper extremity supported Standing balance-Leahy Scale: Fair Standing balance comment: Static stand and min balance challenges without support; Required bil support for ambulation               High Level Balance Comments: Worked on higher level balance including standing feet apart EO/EC without difficulty; feet together EO/EC with mild swaying EC; tandem - assist to obtain position but could maintain, could not do EC; reaching outside BOS all directions; turning in circle            Cognition Arousal/Alertness: Awake/alert Behavior During Therapy: WFL for tasks assessed/performed Overall Cognitive Status: Within Functional Limits for tasks assessed                                        Exercises      General Comments        Pertinent Vitals/Pain Pain Assessment: No/denies pain    Home Living  Prior Function            PT Goals (current goals can now be found in the care plan section) Acute Rehab PT Goals Patient Stated Goal: to get home PT Goal Formulation: With patient Time For Goal Achievement: 11/21/20 Potential to Achieve Goals: Good Progress towards PT goals: Progressing toward goals    Frequency    Min 3X/week       PT Plan Discharge plan needs to be updated    Co-evaluation              AM-PAC PT "6 Clicks" Mobility   Outcome Measure  Help needed turning from your back to your side while in a flat bed without using bedrails?: A Little Help needed moving from lying on your back to sitting on the side of a flat bed without using bedrails?: A Little Help needed moving to and from a bed to a chair (including a wheelchair)?: A Little Help needed standing up from a chair using your arms (e.g., wheelchair or bedside chair)?: A Little Help needed to walk in hospital room?: A Little Help needed climbing 3-5 steps with a railing? : A Little 6 Click Score: 18    End of Session Equipment Utilized During Treatment: Gait belt Activity Tolerance: Patient tolerated treatment well Patient left: in bed;with call bell/phone within reach;with bed alarm set Nurse Communication: Mobility status PT Visit Diagnosis: Unsteadiness on feet (R26.81);Other abnormalities of gait and mobility (R26.89)     Time: 0071-2197 PT Time Calculation (min) (ACUTE ONLY): 26 min  Charges:  $Gait Training: 8-22 mins $Neuromuscular Re-education: 8-22 mins                     Abran Richard, PT Acute Rehab Services Pager [phone] [facility] [place] 11/08/2020, 6:05 PM

## 2020-11-08 NOTE — Evaluation (Signed)
Occupational Therapy Evaluation Patient Details Name: Ruth Gutierrez MRN: [id number] DOB: [birth year] Today's Date: 11/08/2020    History of Present Illness Ruth Gutierrez is a 55 y/o female who presented to ED after becoming nonresponsive during HD. Pt was admitted on 10/29/20 with intracranial mass, AMS, and PNA. S/p craniotomy for suspected meningioma on 3/31.   Clinical Impression   PTA patient independent and driving. Admitted for above and limited by problem list below, including impaired balance, decreased activity tolerance, and headache.  Patient completing ADLs with up to min assist, transfers with min guard (but limited to sit to stand due to elevated BP and MAP of 122 in standing).  She reports dizziness in standing, but reports this is likely due to pain medication, mild unsteadiness in standing requiring min guard statically. Patient will have good support at home from family, educated pt and daughter on ADL safety with completion of tasks seated for bathing/dressing, progression of activity tolerance.  Based on performance today, recommend continued OT services acutely and after dc at [facility] level to optimize independence and safety with ADLs, IADLs and mobility.      Follow Up Recommendations  Home health OT;Supervision/Assistance - 24 hour    Equipment Recommendations  3 in 1 bedside commode    Recommendations for Other Services       Precautions / Restrictions Precautions Precautions: Fall Precaution Comments: watch BP, including MAP (70-100) Restrictions Weight Bearing Restrictions: No      Mobility Bed Mobility Overal bed mobility: Needs Assistance Bed Mobility: Supine to Sit;Sit to Supine     Supine to sit: Supervision Sit to supine: Supervision   General bed mobility comments: Supervision for safety, no physical assist given    Transfers Overall transfer level: Needs assistance Equipment used: None Transfers: Sit to/from Stand Sit to Stand: Min guard          General transfer comment: for safety, mild unsteadiness and reports dizzness (reports d/t pain meds)    Balance Overall balance assessment: Needs assistance Sitting-balance support: Feet supported;No upper extremity supported Sitting balance-Leahy Scale: Good     Standing balance support: No upper extremity supported;During functional activity Standing balance-Leahy Scale: Fair Standing balance comment: min guard, mild unsteadiness                           ADL either performed or assessed with clinical judgement   ADL Overall ADL's : Needs assistance/impaired     Grooming: Set up;Sitting       Lower Body Bathing: Min guard;Sit to/from stand   Upper Body Dressing : Set up;Sitting   Lower Body Dressing: Min guard;Sit to/from stand Lower Body Dressing Details (indicate cue type and reason): able to don/doff socks, min guard sit to stand- discussed safety seated   Toilet Transfer Details (indicate cue type and reason): deferred, anticipate min guard (min guard sit to stand)         Functional mobility during ADLs: Min guard General ADL Comments: pt limited by elevated BP (MAP 122 in standing), decreased activity tolerance, impaired balance (dizzy with standing but relates this to pain meds).  Educated on safety and seated ADLs, will have good support     Vision Baseline Vision/History: Wears glasses Wears Glasses: Reading only Patient Visual Report: No change from baseline Vision Assessment?: No apparent visual deficits     Perception     Praxis      Pertinent Vitals/Pain Pain Assessment: 0-10 Pain Score: 6  Pain  Location: head Pain Descriptors / Indicators: Aching;Discomfort;Grimacing Pain Intervention(s): Limited activity within patient's tolerance;Monitored during session;Premedicated before session;Repositioned     Hand Dominance Right   Extremity/Trunk Assessment Upper Extremity Assessment Upper Extremity Assessment: Overall WFL for  tasks assessed   Lower Extremity Assessment Lower Extremity Assessment: Defer to PT evaluation       Communication Communication Communication: No difficulties   Cognition Arousal/Alertness: Awake/alert Behavior During Therapy: WFL for tasks assessed/performed Overall Cognitive Status: Within Functional Limits for tasks assessed                                     General Comments  daughter present and supportive    Exercises     Shoulder Instructions      Home Living Family/patient expects to be discharged to:: Private residence Living Arrangements: Other relatives Available Help at Discharge: Family;Available 24 hours/day Type of Home: Apartment Home Access: Stairs to enter Entrance Stairs-Number of Steps: 7 Entrance Stairs-Rails: Left Home Layout: One level     Bathroom Shower/Tub: Teacher, early years/pre: Standard     Home Equipment: None          Prior Functioning/Environment Level of Independence: Independent        Comments: driving, not working        OT Problem List: Decreased activity tolerance;Impaired balance (sitting and/or standing);Decreased safety awareness;Decreased knowledge of use of DME or AE;Decreased knowledge of precautions;Pain      OT Treatment/Interventions: Self-care/ADL training;Therapeutic exercise;DME and/or AE instruction;Therapeutic activities;Patient/family education;Balance training    OT Goals(Current goals can be found in the care plan section) Acute Rehab OT Goals Patient Stated Goal: to get home OT Goal Formulation: With patient Time For Goal Achievement: 11/22/20 Potential to Achieve Goals: Good  OT Frequency: Min 2X/week   Barriers to D/C:            Co-evaluation              AM-PAC OT "6 Clicks" Daily Activity     Outcome Measure Help from another person eating meals?: None Help from another person taking care of personal grooming?: A Little Help from another person  toileting, which includes using toliet, bedpan, or urinal?: A Little Help from another person bathing (including washing, rinsing, drying)?: A Little Help from another person to put on and taking off regular upper body clothing?: A Little Help from another person to put on and taking off regular lower body clothing?: A Little 6 Click Score: 19   End of Session Nurse Communication: Mobility status;Other (comment) (BP- pt provided meds at end of session)  Activity Tolerance: Treatment limited secondary to medical complications (Comment) (elevated BP) Patient left: in bed;with call bell/phone within reach;with bed alarm set;with nursing/sitter in room;with family/visitor present  OT Visit Diagnosis: Other abnormalities of gait and mobility (R26.89);Pain Pain - part of body:  (HA)                Time: 3419-6222 OT Time Calculation (min): 16 min Charges:  OT General Charges $OT Visit: 1 Visit OT Evaluation $OT Eval Moderate Complexity: 1 Mod  Jolaine Artist, OT Acute Rehabilitation Services Pager [phone] Office [phone]   [name] 11/08/2020, 10:52 AM

## 2020-11-08 NOTE — TOC Initial Note (Signed)
Transition of Care [facility]) - Initial/Assessment Note    Patient Details  Name: Ruth Gutierrez MRN: [id number] Date of Birth: Apr 30, 1966  Transition of Care [facility]) CM/SW Contact:    Pollie Friar, RN Phone Number: 11/08/2020, 10:50 AM  Clinical Narrative:                 Patient states she lives with her sister who is there most of the time. She states her family will also fill in when sister is not at the home so she will have the supervision recommended.  Pt denies issues with home meds and transportation. Pt without a PCP. CM inquired with [facility] and they will pick her up in the clinic.  Recommendations for [facility] services. Pt has no preference. HH arranged through [facility].  TOC following for further d/c needs.  Expected Discharge Plan: [place] Barriers to Discharge: Continued Medical Work up   Patient Goals and CMS Choice   CMS Medicare.gov Compare Post Acute Care list provided to:: Patient Choice offered to / list presented to : Patient  Expected Discharge Plan and Services Expected Discharge Plan: [place]   Discharge Planning Services: CM Consult Post Acute Care Choice: [place] arrangements for the past 2 months: [place]: PT,OT [place] Agency: [facility] Date [place]: 11/08/20   Representative spoke with at [place]: Amy  Prior Living Arrangements/Services Living arrangements for the past 2 months: [place] with:: Siblings Patient language and need for interpreter reviewed:: Yes Do you feel safe going back to the place where you live?: Yes      Need for Family Participation in Patient Care: Yes (Comment) Care giver support system in place?: Yes (comment)   Criminal Activity/Legal Involvement Pertinent to Current Situation/Hospitalization: No - Comment as needed  Activities of Daily Living Home Assistive  Devices/Equipment: Eyeglasses ADL Screening (condition at time of admission) Patient's cognitive ability adequate to safely complete daily activities?: Yes Is the patient deaf or have difficulty hearing?: No Does the patient have difficulty seeing, even when wearing glasses/contacts?: Yes Does the patient have difficulty concentrating, remembering, or making decisions?: Yes Patient able to express need for assistance with ADLs?: No Does the patient have difficulty dressing or bathing?: No Independently performs ADLs?: Yes (appropriate for developmental age) Does the patient have difficulty walking or climbing stairs?: Yes Weakness of Legs: Both Weakness of Arms/Hands: Both  Permission Sought/Granted                  Emotional Assessment Appearance:: Appears stated age Attitude/Demeanor/Rapport: Engaged Affect (typically observed): Accepting Orientation: : Oriented to Self,Oriented to Place,Oriented to  Time,Oriented to Situation   Psych Involvement: No (comment)  Admission diagnosis:  Somnolence [R40.0] Encephalopathy [G93.40] Hypoxia [R09.02] Brain mass [G93.89] HCAP (healthcare-associated pneumonia) [J18.9] Patient Active Problem List   Diagnosis Date Noted  . Encounter for central line placement   . Encephalopathy 10/29/2020  . Coagulation defect, unspecified ([place]) 11/01/2019  . Pain, unspecified 11/01/2019  . Pruritus, unspecified 11/01/2019  . Shortness of breath 11/01/2019  . Hypokalemia 10/28/2019  . Infection due to acinetobacter baumannii   . Peritonitis ([place]) 10/04/2019  . PD catheter dysfunction ([place])   . Generalized (acute) peritonitis ([place]) 10/03/2019  . Hypercalcemia 09/13/2019  . Nonspecific  reaction to tuberculin skin test without active tuberculosis 05/04/2018  . Anemia in chronic kidney disease 04/29/2018  . Liver disease, unspecified 04/29/2018  . Moderate protein-calorie malnutrition ([place]) 04/29/2018  . Other abnormal findings in urine  04/29/2018  . Other dietary vitamin B12 deficiency anemia 04/29/2018  . Other disorders of electrolyte and fluid balance, not elsewhere classified 04/29/2018  . Other disorders resulting from impaired renal tubular function 04/29/2018  . Other hyperlipidemia 04/29/2018  . Other long term (current) drug therapy 04/29/2018  . Unspecified jaundice 04/29/2018  . Renal dialysis device, implant, or graft complication 80/88/1103  . ESRD on hemodialysis ([place])   . HCAP (healthcare-associated pneumonia)   . Sepsis ([place]) 11/12/2015  . Pneumonia 11/12/2015  . FUO (fever of unknown origin) 05/17/2015  . Failed kidney transplant 05/08/2015  . Graft rejection, immunologic 05/08/2015  . Type 2 diabetes mellitus with diabetic chronic kidney disease ([place]) 10/12/2014  . Hypertension associated with transplantation 09/22/2014  . Non-compliance with treatment 09/22/2014  . Overweight (BMI 25.0-29.9) 09/22/2014  . Secondary renal hyperparathyroidism ([place]) 09/22/2014  . Subconjunctival hemorrhage of left eye 09/22/2014  . Transplant follow-up 09/22/2014  . Conjunctivitis of left eye 09/20/2014  . Acute blood loss anemia 09/08/2014  . Abnormal uterine bleeding 09/07/2014  . History of medication noncompliance 04/18/2014  . Immunosuppression ([place]) 04/18/2014  . Acute renal failure ([place]) 04/05/2013  . Diarrhea 04/05/2013  . Acute pyelonephritis 02/02/2013  . Viral gastroenteritis 02/02/2013  . Sepsis due to Klebsiella pneumoniae ([place]) 06/02/2011  . Fatigue 06/02/2011  . Tobacco abuse 06/02/2011  . Iron deficiency anemia 04/04/2011  . Hypertension 04/04/2011  . S/P kidney transplant 04/04/2011  . Clotted renal dialysis AV graft ([place]) 04/04/2011  . Fibroids 04/04/2011  . Preventative health care 04/04/2011   PCP:  Merryl Hacker No Pharmacy:   [place] ([place]), [place] - [address] [place] [address] Phone: [phone] Fax:  [phone]     Social Determinants of Health (SDOH) Interventions    Readmission Risk Interventions No flowsheet data found.

## 2020-11-08 NOTE — Plan of Care (Signed)

## 2020-11-08 NOTE — Progress Notes (Signed)
HD#10 Subjective:  Overnight Events: none  Patient is seen at bedside.  She appears comfortable and in no acute distress.  States that her headache is very mild.  Denies other complaints.  Objective:  Vital signs in last 24 hours: Vitals:   11/07/20 1800 11/07/20 2012 11/07/20 2353 11/08/20 0338  BP: (!) 161/74 (!) 166/80 (!) 144/80 (!) 184/87  Pulse:  76 71 71  Resp:  15 16   Temp:  98.3 F (36.8 C) 98.2 F (36.8 C) 98.4 F (36.9 C)  TempSrc:  Oral Oral Oral  SpO2:  99% 97% 98%  Weight:       Supplemental O2: Room Air SpO2: 98 % O2 Flow Rate (L/min): 2 L/min   Physical Exam:  Physical Exam Constitutional:      General: She is not in acute distress. HENT:     Head: Normocephalic.  Eyes:     General:        Right eye: No discharge.        Left eye: No discharge.  Cardiovascular:     Rate and Rhythm: Normal rate and regular rhythm.  Pulmonary:     Effort: Pulmonary effort is normal. No respiratory distress.  Musculoskeletal:     Right lower leg: No edema.     Left lower leg: No edema.  Neurological:     Mental Status: She is alert.     Filed Weights   10/31/20 1710 11/07/20 1303 11/07/20 1637  Weight: 75.5 kg 80.4 kg 77.8 kg     Intake/Output Summary (Last 24 hours) at 11/08/2020 0619 Last data filed at 11/07/2020 1800 Gross per 24 hour  Intake 888.5 ml  Output 2500 ml  Net -1611.5 ml   Net IO Since Admission: -737.4 mL [11/08/20 0619]  Pertinent Labs: CBC Latest Ref Rng & Units 11/07/2020 11/06/2020 11/05/2020  WBC 4.0 - 10.5 K/uL 10.0 11.7(H) 11.4(H)  Hemoglobin 12.0 - 15.0 g/dL 7.7(L) 8.2(L) 8.5(L)  Hematocrit 36.0 - 46.0 % 23.9(L) 25.6(L) 26.3(L)  Platelets 150 - 400 K/uL 137(L) 147(L) 140(L)    CMP Latest Ref Rng & Units 11/08/2020 11/07/2020 11/06/2020  Glucose 70 - 99 mg/dL 118(H) 125(H) 140(H)  BUN 6 - 20 mg/dL 25(H) 61(H) 47(H)  Creatinine 0.44 - 1.00 mg/dL 4.94(H) 7.85(H) 5.86(H)  Sodium 135 - 145 mmol/L 136 138 135  Potassium 3.5 - 5.1 mmol/L  5.4(H) 5.6(H) 5.0  Chloride 98 - 111 mmol/L 98 105 103  CO2 22 - 32 mmol/L 27 20(L) 23  Calcium 8.9 - 10.3 mg/dL 8.9 8.5(L) 8.2(L)  Total Protein 6.5 - 8.1 g/dL - - -  Total Bilirubin 0.3 - 1.2 mg/dL - - -  Alkaline Phos 38 - 126 U/L - - -  AST 15 - 41 U/L - - -  ALT 0 - 44 U/L - - -    Imaging: No results found.  Assessment/Plan:   Principal Problem:   Encephalopathy Active Problems:   Hypertension   ESRD on hemodialysis (HCC)   Type 2 diabetes mellitus with diabetic chronic kidney disease [facility])   Patient Summary: Patient is a 55 year old female with past medical history of ESRD status post failed renal transplant 2010 and removed 2017,who was admitted for an acute encephalopathy,found to have a large olfactory groove anterior skull base meningioma with severe bifrontal brain swelling. Status post tumor excision on 3/31.  Large anterior skull base meningiomas/presection3/31 Bifrontal brain swelling [place] at baseline.   Likely to be discharged soon -Appreciate neurosurgery recommendation -Continue  Decadron taper: 1 mg BID (4/7) > 1 mg daily (4/10) -GoalMAP 70 - 100per neurosurgery -Continue Keppra for 14 days postop -PT/OT recommended HH PT/OT.   Hypertension Goal MAP 70-100 per neurosurgery. Blood pressure remains elevated after HD yesterday.  Patient is euvolemic on exam.  Her pain seems to be under controlled.  Decadron is tapered down to 1 mg twice daily today, hopefully will help with blood pressure as well.  Will increase Coreg to 12.5 mg twice daily and monitor her heart rate closely. -Increase Coreg 12.5 mg BID -Continue amlodipine 10 mg -Continue Clonidine 0.2 mg TID -Labetalol 10 mg PRN    ESRD status post failed renal transplant Anemia of chronic disease(s/p 1 u pRBC 3/31) Secondary hyperparathyroidism HD scheduleMWF. Next HD tomorrow -Appreciate nephrology recommendation -Receive EPO weekly with dialysis -Continue Sensipar, hectorol  and velphoro per Nephro.   Hyperactive delirium  No episode of agitation or delirium overnight. -Continue Seroquel 100 mg BID -Tapering Decadron -can give Ativan as needed for agitation  Diet: renal DVT: SCDs CODE: full  Prior to Admission Living Arrangement:home Anticipated Discharge Location:pending PT/OT recs Barriers to Discharge:medical treatment Dispo: Anticipated discharge in approximately3day(s).   Gaylan Gerold, DO 11/08/2020, 6:19 AM Pager: [phone]  Please contact the on call pager after 5 pm and on weekends at [phone].

## 2020-11-09 LAB — BASIC METABOLIC PANEL
Anion gap: 11 (ref 5–15)
BUN: 42 mg/dL — ABNORMAL HIGH (ref 6–20)
CO2: 27 mmol/L (ref 22–32)
Calcium: 9.1 mg/dL (ref 8.9–10.3)
Chloride: 95 mmol/L — ABNORMAL LOW (ref 98–111)
Creatinine, Ser: 7.16 mg/dL — ABNORMAL HIGH (ref 0.44–1.00)
GFR, Estimated: 6 mL/min — ABNORMAL LOW (ref >60)
Glucose, Bld: 112 mg/dL — ABNORMAL HIGH (ref 70–99)
Potassium: 5.2 mmol/L — ABNORMAL HIGH (ref 3.5–5.1)
Sodium: 133 mmol/L — ABNORMAL LOW (ref 135–145)

## 2020-11-09 LAB — CBC
HCT: 24.4 % — ABNORMAL LOW (ref 36.0–46.0)
Hemoglobin: 8.1 g/dL — ABNORMAL LOW (ref 12.0–15.0)
MCH: 31.6 pg (ref 26.0–34.0)
MCHC: 33.2 g/dL (ref 30.0–36.0)
MCV: 95.3 fL (ref 80.0–100.0)
Platelets: 124 10*3/uL — ABNORMAL LOW (ref 150–400)
RBC: 2.56 MIL/uL — ABNORMAL LOW (ref 3.87–5.11)
RDW: 14.9 % (ref 11.5–15.5)
WBC: 9 10*3/uL (ref 4.0–10.5)
nRBC: 0 % (ref 0.0–0.2)

## 2020-11-09 LAB — GLUCOSE, CAPILLARY
Glucose-Capillary: 103 mg/dL — ABNORMAL HIGH (ref 70–99)
Glucose-Capillary: 107 mg/dL — ABNORMAL HIGH (ref 70–99)
Glucose-Capillary: 110 mg/dL — ABNORMAL HIGH (ref 70–99)
Glucose-Capillary: 116 mg/dL — ABNORMAL HIGH (ref 70–99)
Glucose-Capillary: 83 mg/dL (ref 70–99)
Glucose-Capillary: 93 mg/dL (ref 70–99)

## 2020-11-09 NOTE — Progress Notes (Signed)
HD#11 Subjective:  Overnight Events: no event   Patient is seen at dialysis today.  She appears comfortable and in no acute distress. She denies headache but endorses mild head pulling sensation. She denies other complains.   Objective:  Vital signs in last 24 hours: Vitals:   11/08/20 1500 11/08/20 1953 11/08/20 2351 11/09/20 0407  BP: (!) 148/79 (!) 150/87 (!) 160/81 (!) 152/78  Pulse: 74 75 72 69  Resp: 18     Temp: 99.2 F (37.3 C) 98.6 F (37 C) 98.7 F (37.1 C) 98.8 F (37.1 C)  TempSrc: Oral Oral Oral Oral  SpO2: 98% 97% 95% 100%  Weight:      Height:       Supplemental O2: Room Air SpO2: 100 % O2 Flow Rate (L/min): 2 L/min   Physical Exam:  Physical Exam Constitutional:      General: She is not in acute distress. HENT:     Head:     Comments: Craniotomy scar noted, staples in place. Wound appears clean and non-infected.   Eyes:     General:        Right eye: No discharge.        Left eye: No discharge.  Cardiovascular:     Rate and Rhythm: Normal rate and regular rhythm.  Pulmonary:     Effort: Pulmonary effort is normal. No respiratory distress.  Skin:    General: Skin is warm.  Neurological:     Mental Status: She is alert.     Filed Weights   11/07/20 1637 11/07/20 2000 11/08/20 1300  Weight: 77.8 kg 77.8 kg 78.3 kg     Intake/Output Summary (Last 24 hours) at 11/09/2020 0732 Last data filed at 11/09/2020 0500 Gross per 24 hour  Intake 1908.5 ml  Output --  Net 1908.5 ml   Net IO Since Admission: 1,171.1 mL [11/09/20 0732]  Pertinent Labs: CBC Latest Ref Rng & Units 11/09/2020 11/08/2020 11/07/2020  WBC 4.0 - 10.5 K/uL 9.0 9.3 10.0  Hemoglobin 12.0 - 15.0 g/dL 8.1(L) 8.5(L) 7.7(L)  Hematocrit 36.0 - 46.0 % 24.4(L) 26.5(L) 23.9(L)  Platelets 150 - 400 K/uL 124(L) 142(L) 137(L)    CMP Latest Ref Rng & Units 11/09/2020 11/08/2020 11/07/2020  Glucose 70 - 99 mg/dL 112(H) 118(H) 125(H)  BUN 6 - 20 mg/dL 42(H) 25(H) 61(H)  Creatinine 0.44 - 1.00  mg/dL 7.16(H) 4.94(H) 7.85(H)  Sodium 135 - 145 mmol/L 133(L) 136 138  Potassium 3.5 - 5.1 mmol/L 5.2(H) 5.4(H) 5.6(H)  Chloride 98 - 111 mmol/L 95(L) 98 105  CO2 22 - 32 mmol/L 27 27 20(L)  Calcium 8.9 - 10.3 mg/dL 9.1 8.9 8.5(L)  Total Protein 6.5 - 8.1 g/dL - - -  Total Bilirubin 0.3 - 1.2 mg/dL - - -  Alkaline Phos 38 - 126 U/L - - -  AST 15 - 41 U/L - - -  ALT 0 - 44 U/L - - -    Imaging: No results found.  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 Status post tumor excision on 3/31.  Large anterior skull base  meningiomas/presection3/31 Bifrontal brain swelling Ment[place]at baseline. Wounds appear clean and noninfected.  Patient is stable for discharge per neurosurgery.  Can apply bacitracin ointment on the wound.  Staples can be removed in 1 week f/u with Neurosurgery.  Her blood pressure is better controlled.  - Appreciate neurosurgery recommendation - Continue Decadron taper: 1 mg BID (4/7) > 1 mg daily (4/10) - GoalMAP 70 - 100per neurosurgery - Continue Keppra for 14 days postop - PT/OT recommended HH PT/OT.   Hypertension BP better controlled with increased dose of Coreg. - Continue Coreg 12.5 mg BID - Continue amlodipine 10 mg - Continue Clonidine 0.2 mg TID - Labetalol 10 mg PRN    ESRD status post failed renal transplant Anemia of chronic disease(s/p 1 u pRBC 3/31) Secondary hyperparathyroidism Patient had HD today.  Per Dr. Jonnie Finner, patient will need another dialysis section tomorrow because of her volume  overload.  Plan to discharge after dialysis. - Appreciate nephrology recommendation - Receive EPO weekly with dialysis - Continue Sensipar, hectorol and velphoro per Nephro.   Hyperactive delirium No episode of agitation or delirium overnight. - Discontinue Seroquel - Tapering Decadron - Can give Ativan as needed for agitation  Diet: renal DVT: SCDs CODE: full  Prior to Admission Living Arrangement:home Anticipated Discharge Location: Home Barriers to Discharge:medical treatment, HD Dispo: Anticipated discharge tomorrow.   Gaylan Gerold, DO 11/09/2020, 7:32 AM Pager: [phone]  Please contact the on call pager after 5 pm and on weekends at [phone].

## 2020-11-09 NOTE — Plan of Care (Signed)

## 2020-11-09 NOTE — Progress Notes (Signed)
OT Cancellation Note  Patient Details Name: Ruth Gutierrez MRN: [id number] DOB: Nov 10, 1965   Cancelled Treatment:    Reason Eval/Treat Not Completed: Patient at procedure or test/ unavailable- pt at HD. Will follow and see as able.   Jolaine Artist, OT Acute Rehabilitation Services Pager [phone] Office [phone]   Ruth Gutierrez 11/09/2020, 11:33 AM

## 2020-11-09 NOTE — Plan of Care (Signed)

## 2020-11-09 NOTE — Progress Notes (Signed)
[facility] Progress Note   Subjective:  Seen on HD, doing well. BP's remain high. Standing wt is 7.5kg over dry wt.  ^'d goal w/ HD to 4 L UF.  Tolerated well.   Objective Vitals:   11/09/20 1100 11/09/20 1130 11/09/20 1200 11/09/20 1225  BP: 120/72 139/68 (!) 152/79 (!) 143/73  Pulse: 68 63 70 67  Resp: 12 14 19 13   Temp:    98.7 F (37.1 C)  TempSrc:    Oral  SpO2:    98%  Weight:    76 kg  Height:       Physical Exam General: Well appearing woman, NAD Heart: RRR; no murmur Lungs: CTAB; no rales Abdomen: soft, non-tender Extremities: No LE edema Dialysis Access:  AVF + bruit  Additional Objective Labs: Basic Metabolic Panel: Recent Labs  Lab 11/04/20 0540 11/04/20 1543 11/05/20 0556 11/06/20 0840 11/07/20 0326 11/08/20 0348 11/09/20 0344  NA 135 135 135   < > 138 136 133*  K 5.1 4.7 4.7   < > 5.6* 5.4* 5.2*  CL 104 104 104   < > 105 98 95*  CO2 24 24 24    < > 20* 27 27  GLUCOSE 115* 247* 91   < > 125* 118* 112*  BUN 25* 23* 26*   < > 61* 25* 42*  CREATININE 2.94* 2.96* 3.03*   < > 7.85* 4.94* 7.16*  CALCIUM 7.6* 7.0* 7.7*   < > 8.5* 8.9 9.1  PHOS 4.8* 3.7 4.1  --   --   --   --    < > = values in this interval not displayed.   Liver Function Tests: Recent Labs  Lab 11/04/20 0540 11/04/20 1543 11/05/20 0556  ALBUMIN 2.5* 2.4* 2.5*   CBC: Recent Labs  Lab 11/05/20 0903 11/06/20 0840 11/07/20 0422 11/08/20 0627 11/09/20 0344  WBC 11.4* 11.7* 10.0 9.3 9.0  NEUTROABS 8.8*  --   --   --   --   HGB 8.5* 8.2* 7.7* 8.5* 8.1*  HCT 26.3* 25.6* 23.9* 26.5* 24.4*  MCV 96.3 97.0 97.6 96.0 95.3  PLT 140* 147* 137* 142* 124*   Medications: . sodium chloride Stopped (11/05/20 2127)   . amLODipine  10 mg Oral Daily  . carvedilol  12.5 mg Oral BID WC  . Chlorhexidine Gluconate Cloth  6 each Topical Q0600  . cinacalcet  180 mg Oral Q M,W,F  . cloNIDine  0.2 mg Oral TID  . darbepoetin (ARANESP) injection - DIALYSIS  100 mcg Intravenous Q  Wed-HD  . dexamethasone  1 mg Oral Q12H   Followed by  . [START ON 11/11/2020] dexamethasone  1 mg Oral Daily  . docusate sodium  100 mg Oral BID  . doxercalciferol  5 mcg Oral Once per day on Mon Wed Fri  . levETIRAcetam  250 mg Oral BID  . lidocaine-prilocaine   Topical Q M,W,F-HD  . pantoprazole  40 mg Oral Daily  . polyethylene glycol  17 g Oral BID  . QUEtiapine  100 mg Oral BID  . senna-docusate  2 tablet Oral BID  . sodium chloride flush  10-40 mL Intracatheter Q12H  . sucroferric oxyhydroxide  1,000 mg Oral TID WC    Dialysis Orders: MWF @ [facility]   4h  400/500  73.5kg  2/2 bath  AVF no heparin (post craniotomy)   - sensipar 180 mwf  - hect 5 ug mwf  - iron 50 weekly  - mircera 75 q2 due  4/4  Assessment/Plan: 1. AMS/Olfactory groove meningioma: S/p excision 3/31. Per neurosurgery. 2. ESRD:S/p CRRT 4/1 - 4/4, now back to intermittent HD on MWF schedule. No heparin for 10-14 days. Next HD 4/8. K 5.4 today - will give dose Lokelma 10mg  to hold her over. 3. Volume/ hypertension:BP still high likely due to vol overload (up7-8kg this am). Got 4L off today, will plan extra HD tomorrow to try and get her to her dry wt. Adjust BP lowering meds prn.  4. Anemia of ESRD:Hgb 8.5 - continue Aranesp 184mcg q Wed 5. Secondary Hyperparathyroidism/Hyperphosphatemia:Continue home sensipar 180mg  and hectorol 95mcg w/ dialysis. On velphoro 1000 w/ meals. 6. Vascular access:LUE AVF w/ thrill and pulsatility   Kelly Splinter, MD 11/09/2020, 1:28 PM

## 2020-11-09 NOTE — Progress Notes (Signed)
PT Cancellation Note  Patient Details Name: Ruth Gutierrez MRN: [id number] DOB: November 14, 1965   Cancelled Treatment:    Reason Eval/Treat Not Completed: Patient at procedure or test/unavailable - at HD, PT to check back as schedule allows.  Stacie Glaze, PT Acute Rehabilitation Services Pager [phone]  Office [phone]    Louis Matte 11/09/2020, 11:49 AM

## 2020-11-09 NOTE — Progress Notes (Signed)
Physical Therapy Treatment Patient Details Name: Ruth Gutierrez MRN: [id number] DOB: [birth year] Today's Date: 11/09/2020    History of Present Illness Ruth Gutierrez is a 55 y/o female who presented to ED after becoming nonresponsive during HD. Pt was admitted on 10/29/20 with intracranial mass, AMS, and PNA. S/p craniotomy for suspected meningioma on 3/31. PMH of HTN, ESRD on HD, arthritis, kidney transplant    PT Comments    Pt reports fatigue from HD, agreeable to mobility. Pt ambulatory in hallway with use of rollator, pt demonstrating steadiness with it but does require min cues for locking/unlocking during transitional movements. Pt progressing well, will continue to follow.   Follow Up Recommendations  Home health PT;Supervision for mobility/OOB (possible progression to outpt neuro PT)     Equipment Recommendations  Other (comment) (rollator)    Recommendations for Other Services       Precautions / Restrictions Precautions Precautions: Fall Precaution Comments: BP parameters: 83-382 systolic, 50-539 diastolic, 76-734 MAP Restrictions Weight Bearing Restrictions: No    Mobility  Bed Mobility Overal bed mobility: Modified Independent                  Transfers Overall transfer level: Needs assistance Equipment used: 4-wheeled walker Transfers: Sit to/from Stand Sit to Stand: Supervision         General transfer comment: supervision for safety, cuing to lock/unlock rollator  Ambulation/Gait Ambulation/Gait assistance: Supervision Gait Distance (Feet): 170 Feet Assistive device: 4-wheeled walker Gait Pattern/deviations: Step-through pattern;Decreased stride length;Drifts right/left Gait velocity: decr   General Gait Details: supervision for safety, slow and steady gait with use of rollator   Stairs             Wheelchair Mobility    Modified Rankin (Stroke Patients Only)       Balance Overall balance assessment: Needs  assistance Sitting-balance support: Feet supported;No upper extremity supported Sitting balance-Leahy Scale: Good     Standing balance support: No upper extremity supported;During functional activity;Bilateral upper extremity supported Standing balance-Leahy Scale: Fair                              Cognition Arousal/Alertness: Awake/alert Behavior During Therapy: WFL for tasks assessed/performed Overall Cognitive Status: Within Functional Limits for tasks assessed                                        Exercises      General Comments        Pertinent Vitals/Pain Pain Assessment: Faces Faces Pain Scale: No hurt Pain Intervention(s): Limited activity within patient's tolerance;Monitored during session    Home Living                      Prior Function            PT Goals (current goals can now be found in the care plan section) Acute Rehab PT Goals Patient Stated Goal: to get home PT Goal Formulation: With patient Time For Goal Achievement: 11/21/20 Potential to Achieve Goals: Good Progress towards PT goals: Progressing toward goals    Frequency    Min 3X/week      PT Plan Current plan remains appropriate    Co-evaluation              AM-PAC PT "6 Clicks" Mobility   Outcome Measure  Help needed  turning from your back to your side while in a flat bed without using bedrails?: None Help needed moving from lying on your back to sitting on the side of a flat bed without using bedrails?: None Help needed moving to and from a bed to a chair (including a wheelchair)?: A Little Help needed standing up from a chair using your arms (e.g., wheelchair or bedside chair)?: A Little Help needed to walk in hospital room?: A Little Help needed climbing 3-5 steps with a railing? : A Little 6 Click Score: 20    End of Session   Activity Tolerance: Patient tolerated treatment well Patient left: in bed;with call bell/phone within  reach;with bed alarm set Nurse Communication: Mobility status PT Visit Diagnosis: Unsteadiness on feet (R26.81);Other abnormalities of gait and mobility (R26.89)     Time: 4098-1191 PT Time Calculation (min) (ACUTE ONLY): 15 min  Charges:  $Gait Training: 8-22 mins                    Stacie Glaze, PT Acute Rehabilitation Services Pager [phone]  Office [phone]   Louis Matte 11/09/2020, 4:44 PM

## 2020-11-09 NOTE — Progress Notes (Signed)
OT Cancellation Note  Patient Details Name: Ruth Gutierrez MRN: [id number] DOB: 05-12-1966   Cancelled Treatment:    Reason Eval/Treat Not Completed: Other (comment). Patient s/p HD and reports already participating in PT. Politely declines therapy this afternoon but agreeable for another day.  Lamiah Marmol L Hartleigh Edmonston 11/09/2020, 4:24 PM

## 2020-11-10 DIAGNOSIS — Z86018 Personal history of other benign neoplasm: Secondary | ICD-10-CM

## 2020-11-10 LAB — CBC
HCT: 25.8 % — ABNORMAL LOW (ref 36.0–46.0)
Hemoglobin: 8.3 g/dL — ABNORMAL LOW (ref 12.0–15.0)
MCH: 31 pg (ref 26.0–34.0)
MCHC: 32.2 g/dL (ref 30.0–36.0)
MCV: 96.3 fL (ref 80.0–100.0)
Platelets: 159 10*3/uL (ref 150–400)
RBC: 2.68 MIL/uL — ABNORMAL LOW (ref 3.87–5.11)
RDW: 15 % (ref 11.5–15.5)
WBC: 12.4 10*3/uL — ABNORMAL HIGH (ref 4.0–10.5)
nRBC: 0 % (ref 0.0–0.2)

## 2020-11-10 LAB — GLUCOSE, CAPILLARY
Glucose-Capillary: 114 mg/dL — ABNORMAL HIGH (ref 70–99)
Glucose-Capillary: 114 mg/dL — ABNORMAL HIGH (ref 70–99)
Glucose-Capillary: 74 mg/dL (ref 70–99)

## 2020-11-10 LAB — BASIC METABOLIC PANEL
Anion gap: 9 (ref 5–15)
BUN: 19 mg/dL (ref 6–20)
CO2: 28 mmol/L (ref 22–32)
Calcium: 7.5 mg/dL — ABNORMAL LOW (ref 8.9–10.3)
Chloride: 97 mmol/L — ABNORMAL LOW (ref 98–111)
Creatinine, Ser: 5.03 mg/dL — ABNORMAL HIGH (ref 0.44–1.00)
GFR, Estimated: 10 mL/min — ABNORMAL LOW (ref >60)
Glucose, Bld: 136 mg/dL — ABNORMAL HIGH (ref 70–99)
Potassium: 4.6 mmol/L (ref 3.5–5.1)
Sodium: 134 mmol/L — ABNORMAL LOW (ref 135–145)

## 2020-11-10 MED ORDER — CARVEDILOL 12.5 MG PO TABS: 12.5000 mg | ORAL_TABLET | Freq: Two times a day (BID) | ORAL | 0 refills | Status: DC

## 2020-11-10 MED ORDER — OXYCODONE HCL 5 MG PO TABS: 5.0000 mg | ORAL_TABLET | ORAL | 0 refills | Status: AC | PRN

## 2020-11-10 MED ORDER — CINACALCET HCL 30 MG PO TABS: 180.0000 mg | ORAL_TABLET | ORAL | 0 refills | Status: AC

## 2020-11-10 MED ORDER — CLONIDINE HCL 0.2 MG PO TABS: 0.2000 mg | ORAL_TABLET | Freq: Three times a day (TID) | ORAL | 0 refills | Status: DC

## 2020-11-10 MED ORDER — SUCROFERRIC OXYHYDROXIDE 500 MG PO CHEW: 1000.0000 mg | CHEWABLE_TABLET | Freq: Three times a day (TID) | ORAL | 0 refills | Status: AC

## 2020-11-10 MED ORDER — DOXERCALCIFEROL 2.5 MCG PO CAPS: 5.0000 ug | ORAL_CAPSULE | ORAL | 0 refills | Status: AC

## 2020-11-10 MED ORDER — DEXAMETHASONE 1 MG PO TABS
1.0000 mg | ORAL_TABLET | Freq: Two times a day (BID) | ORAL | 0 refills | Status: DC
Start: 2020-11-10 — End: 2021-10-22

## 2020-11-10 MED ORDER — LEVETIRACETAM 250 MG PO TABS: 250.0000 mg | ORAL_TABLET | Freq: Two times a day (BID) | ORAL | 0 refills | Status: DC

## 2020-11-10 MED ORDER — AMLODIPINE BESYLATE 10 MG PO TABS: 10.0000 mg | ORAL_TABLET | Freq: Every day | ORAL | 0 refills | Status: DC

## 2020-11-10 NOTE — Procedures (Signed)
   I was present at this dialysis session, have reviewed the session itself and made  appropriate changes Kelly Splinter MD [place] pager [phone]   11/10/2020, 11:15 AM

## 2020-11-10 NOTE — Discharge Summary (Signed)
Name: Ruth Gutierrez MRN: [id number] DOB: 1965-10-10 55 y.o. PCP: Pcp, No  Date of Admission: 10/29/2020  9:31 AM Date of Discharge: 11/10/2020 Attending Physician: Lucious Groves, DO  Discharge Diagnosis: 1. Large anterior skull base meningioma s/p resection 11/01/2020 2. ESRD s/p failed renal transplant 3. Anemia of chronic disease 4. Secondary hyperparathyroidism  5. Hyperactive delirium   Discharge Medications: Allergies as of 11/10/2020      Reactions   Penicillin G Itching, Rash   Penicillins Itching, Rash   Did it involve swelling of the face/tongue/throat, SOB, or low BP?Y Did it involve sudden or severe rash/hives, skin peeling, or any reaction on the inside of your mouth or nose? Y Did you need to seek medical attention at a hospital or doctor's office? Y When did it last happen?2016 If all above answers are "NO", may proceed with cephalosporin use.      Medication List    STOP taking these medications   calcitRIOL 0.25 MCG capsule Commonly known as: ROCALTROL   MIRCERA IJ   potassium chloride SA 20 MEQ tablet Commonly known as: KLOR-CON     TAKE these medications   amLODipine 10 MG tablet Commonly known as: NORVASC Take 1 tablet (10 mg total) by mouth daily. What changed:   medication strength  how much to take   aspirin 81 MG EC tablet Take 81 mg by mouth as needed for pain.   carvedilol 12.5 MG tablet Commonly known as: COREG Take 1 tablet (12.5 mg total) by mouth 2 (two) times daily with a meal.   cholecalciferol 25 MCG (1000 UNIT) tablet Commonly known as: VITAMIN D3 Take 1,000 Units by mouth daily.   cinacalcet 30 MG tablet Commonly known as: SENSIPAR Take 6 tablets (180 mg total) by mouth every Monday, Wednesday, and Friday. Start taking on: November 12, 2020 What changed:   how much to take  when to take this   cloNIDine 0.2 MG tablet Commonly known as: CATAPRES Take 1 tablet (0.2 mg total) by mouth 3 (three) times  daily.   dexamethasone 1 MG tablet Commonly known as: DECADRON Take 1 tablet (1 mg total) by mouth every 12 (twelve) hours. Take 1 tablet twice daily then reduce to 1 tablet daily on 11/11/2020 for 3 more days.   doxercalciferol 2.5 MCG capsule Commonly known as: HECTOROL Take 2 capsules (5 mcg total) by mouth 3 (three) times a week. Start taking on: November 12, 2020   levETIRAcetam 250 MG tablet Commonly known as: KEPPRA Take 1 tablet (250 mg total) by mouth 2 (two) times daily for 9 days.   lidocaine-prilocaine cream Commonly known as: EMLA Apply 1 application topically every Monday, Wednesday, and Friday with hemodialysis.   omeprazole 20 MG tablet Commonly known as: PRILOSEC OTC Take 20 mg by mouth daily as needed for heartburn.   oxyCODONE 5 MG immediate release tablet Commonly known as: Oxy IR/ROXICODONE Take 1 tablet (5 mg total) by mouth every 4 (four) hours as needed for up to 5 days for severe pain or breakthrough pain.   polyethylene glycol 17 g packet Commonly known as: MIRALAX / GLYCOLAX Take 17 g by mouth daily.   sucroferric oxyhydroxide 500 MG chewable tablet Commonly known as: VELPHORO Chew 2 tablets (1,000 mg total) by mouth 3 (three) times daily with meals. What changed:   how much to take  additional instructions            Discharge Care Instructions  (From admission, onward)  Start     Ordered   11/10/20 0000  Discharge wound care:       Comments: Can apply bacitracin ointment on the wound.  Follow-up with neurosurgery in 1 week for staples removal   11/10/20 1000          Disposition and follow-up:   Ms.Vonita H Foster-Obua was discharged from [facility] in Stable condition.  At the hospital follow up visit please address:  1.    Large anterior skull base meningioma s/p resection 11/01/2020 - Follow up with Neurosurgery in 1 week for staples removal. Can apply bacitracin ointment on the wound.  - Continue  Keppra 14 days post op (End date 11/19/2020) - Tapering Decadron: 1 mg BID then reduce to 1 mg daily on 11/11/2020 for 3 more days (End date 11/14/2020).   Hypertension MAP goal 70-100 per Neurology - Med lists: amlodipine 10 mg daily, Coreg 12.5 mg BID, Clonidine 0.2 mg TID,  - Ensure medications compliance. Adjust as needed   ESRD s/p failed renal transplant Anemia of chronic disease Secondary hyperparathyroidism  HD MWF.  - Patient is receiving Epo with HD weekly - Taking Sensipar 180 mg and Hectorol 5 mcg with HD. And Velphoro 1000 mg TID with meals.  -Patient was taking potassium supplement 40 mEq with HD.  Her potassium was elevated during this admission.  I will hold until she sees her nephrologist. - Follow up with Nephrology outpatient.   Hyperactive delirium  Developed delirium that requires Precedex drip while in the ICU. Likely due to steroid vs brain mass.  She was started on Seroquel, which was discontinued on discharge. - Monitor outpatient   2.  Labs / imaging needed at time of follow-up: CBC, BMP  3.  Pending labs/ test needing follow-up: NA  Follow-up Appointments:  Follow-up Information    [facility] Follow up.   Specialty: Home Health Services Why: The home health agency will contact you for the first home visit. Contact information: [place] [address] [place], [place]. Call in 1 day(s).   Specialty: Neurosurgery Contact information: [address] [place] [phone] [place]. Schedule an appointment as soon as possible for a visit in 1 week(s).   Contact information: [address] [place] [phone]             Subjective Patient is seen at bedside.  She is appears comfortable and in no acute distress.  She denies any headache but endorses some pulling sensation in her  head.  She denies other complaints.  She states that her daughter can help her at home after discharge.  Hospital Course by problem list: 1. Large anterior skull base meningioma s/p resection 11/01/2020 Patient presented to the hospital on 3/20/122 for feeling lethargic and shortness of breath after her dialysis section.  Her daughter states that patient has had generalized weakness over the last few weeks.  She also complained of severe frontal headaches in the last few months.  Patient reports compliant with her HD sections.  Initial work-up include chest x-ray concerning for bilateral pneumonia vs pulmonary edema.  She was also noted to be hypertensive at 244/101 initially.  She was started on vancomycin and cefepime, but was discontinued due to pruritus and emesis (3/31-4/2).  CT /MRI head revealed a 4.2 x  3.7 x 2.8 cm meningioma of the anterior cranial fossa with severe vasogenic edema bilateral frontal lobes.  Neurosurgery was consulted and patient underwent surgical resection on 3/31.  Patient was transferred to the ICU for postop management.  She was started on Decadron tapers.  Neurosurgery recommended a goal MAP of 70-100.  She was originally on a Cleviprex infusion and transition to amlodipine 10 mg daily, Coreg 12.5 mg BID, and clonidine 0.2 mg TID.  Her blood pressure were within range on discharge.  Patient will follow up with neurosurgery in 1 week for staple removal.  2. ESRD s/p failed renal transplant Patient goes to her dialysis center on Mondays Wednesday Friday.  Patient was started on CRRT on 11/02/20 to minimize osmotic shift.  She was then transition back to her regular HD schedule on 4/6 after leaving the ICU.  She has a left AV fistula.  Patient received 1 units of blood on 3/31 and continue with Epo injection weekly dialysis.  Patient is taking Sensipar 100 mg + Hectorol 5 mcg with HD and Velphoro 1000 mg TID with meals for her secondary hyperparathyroidism.  Patient will resume her  regular HD schedule after discharge and follow-up with nephrology outpatient.  3. Hyperactive delirium  Patient developed an acute hyperactive delirium in the ICU, likely due to steroid induced vs brain mass.  She was placed on Precedex infusion, which was tapering down.  Seroquel was started.  Patient did not develop any further episodes of agitation after getting out of the ICU.  Seroquel was not on her home medication list and was continued after discharged.   Discharge Exam:   BP 132/71   Pulse 68   Temp 98.9 F (37.2 C) (Oral)   Resp 12   Ht 5\' 7"  (1.702 m)   Wt 76.7 kg   LMP 06/02/2015   SpO2 99%   BMI 26.48 kg/m  Discharge exam:  Physical Exam Constitutional:      General: She is not in acute distress. HENT:     Head: Normocephalic.  Eyes:     General:        Right eye: No discharge.        Left eye: No discharge.  Cardiovascular:     Rate and Rhythm: Normal rate and regular rhythm.  Pulmonary:     Effort: Pulmonary effort is normal. No respiratory distress.  Abdominal:     General: There is no distension.  Musculoskeletal:     Right lower leg: No edema.     Left lower leg: No edema.  Neurological:     Mental Status: She is alert.  Psychiatric:        Mood and Affect: Mood normal.     Pertinent Labs, Studies, and Procedures:  CBC Latest Ref Rng & Units 11/10/2020 11/09/2020 11/08/2020  WBC 4.0 - 10.5 K/uL 12.4(H) 9.0 9.3  Hemoglobin 12.0 - 15.0 g/dL 8.3(L) 8.1(L) 8.5(L)  Hematocrit 36.0 - 46.0 % 25.8(L) 24.4(L) 26.5(L)  Platelets 150 - 400 K/uL 159 124(L) 142(L)   CMP Latest Ref Rng & Units 11/10/2020 11/09/2020 11/08/2020  Glucose 70 - 99 mg/dL 136(H) 112(H) 118(H)  BUN 6 - 20 mg/dL 19 42(H) 25(H)  Creatinine 0.44 - 1.00 mg/dL 5.03(H) 7.16(H) 4.94(H)  Sodium 135 - 145 mmol/L 134(L) 133(L) 136  Potassium 3.5 - 5.1 mmol/L 4.6 5.2(H) 5.4(H)  Chloride 98 - 111 mmol/L 97(L) 95(L) 98  CO2 22 - 32 mmol/L 28 27 27   Calcium 8.9 - 10.3 mg/dL 7.5(L) 9.1  8.9  Total Protein  6.5 - 8.1 g/dL - - -  Total Bilirubin 0.3 - 1.2 mg/dL - - -  Alkaline Phos 38 - 126 U/L - - -  AST 15 - 41 U/L - - -  ALT 0 - 44 U/L - - -   CT HEAD WO CONTRAST  Result Date: 10/29/2020 CLINICAL DATA:  Delirium.  Worsening generalized weakness. EXAM: CT HEAD WITHOUT CONTRAST TECHNIQUE: Contiguous axial images were obtained from the base of the skull through the vertex without intravenous contrast. COMPARISON:  None. FINDINGS: Brain: Isodense mass with speckled calcifications centered on the cribriform plate and measuring up to 4 x 3.6 x 2.5 cm. The mass has a dural-based epicenter without visible hyperostosis or transcranial growth. There is marked vasogenic edematous appearance of the bilateral frontal lobes with an adjacent cystic component between the mass in the right frontal lobe measuring up to 2.8 cm. The mass is most likely meningioma; no chart history of malignancy. Given the degree of brain edema atypical variants are considered. No hydrocephalus or herniation but there is diffuse effacement of both supratentorial sulci. No hemorrhage. Vascular: Negative Skull: Negative Sinuses/Orbits: Negative Currently attempting call report. IMPRESSION: Large mass centered on the cribriform plate which is most likely a meningioma with peritumoral cyst. There is marked adjacent vasogenic edema with signs of elevated intracranial pressure in the supratentorial space. Atypical variants are considered given the degree brain edema. Recommend MRI with contrast. Electronically Signed   By: Monte Fantasia M.D.   On: 10/29/2020 12:03   MR BRAIN WO CONTRAST  Result Date: 10/29/2020 CLINICAL DATA:  Brain mass EXAM: MRI HEAD WITHOUT CONTRAST TECHNIQUE: Multiplanar, multiecho pulse sequences of the brain and surrounding structures were obtained without intravenous contrast. COMPARISON:  Head CT 10/29/2020 FINDINGS: Brain: There is an extra-axial mass of the anterior cranial fossa measuring 4.2 x 3.7 x 2.8 cm. An  adjacent cyst measures 2.0 x 2.6 cm. There is severe vasogenic edema throughout both frontal lobes, left worse than right. No hydrocephalus. Vascular: Major flow voids are preserved. Skull and upper cervical spine: Normal calvarium and skull base. Visualized upper cervical spine and soft tissues are normal. Sinuses/Orbits:No paranasal sinus fluid levels or advanced mucosal thickening. Bilateral mastoid effusions. Nasopharynx is clear. Normal orbits. IMPRESSION: 4.2 x 3.7 x 2.8 cm meningioma of the anterior cranial fossa causing severe vasogenic edema throughout both frontal lobes, left worse than right. Electronically Signed   By: Ulyses Jarred M.D.   On: 10/29/2020 21:52   DG Chest Port 1 View  Result Date: 10/29/2020 CLINICAL DATA:  Hypoxia EXAM: PORTABLE CHEST 1 VIEW COMPARISON:  October 28, 2019 FINDINGS: Heart is mildly enlarged with mild pulmonary venous hypertension. There is airspace opacity in each lung base, more on the right than on the left. There is no appreciable adenopathy. There is a stent in the left axillary region. IMPRESSION: 1. Airspace opacity in the lung bases which is concerning for potential degree of bibasilar pneumonia. Pulmonary edema could present similarly is a differential consideration. Both pneumonia and edema may present concurrently. 2. Heart mildly prominent with a degree of pulmonary vascular congestion. Electronically Signed   By: Lowella Grip III M.D.   On: 10/29/2020 10:29   ECHOCARDIOGRAM COMPLETE  Result Date: 10/30/2020    ECHOCARDIOGRAM REPORT   Patient Name:   RISHITA PETRON Date of Exam: 10/30/2020 Medical Rec #:  [id number]            Height:  67.0 in Accession #:    [id number]           Weight:       164.8 lb Date of Birth:  May 21, 1966            BSA:          1.863 m Patient Age:    72 years             BP:           167/90 mmHg Patient Gender: F                    HR:           95 bpm. Exam Location:  Inpatient Procedure: 2D Echo, Cardiac Doppler  and Color Doppler Indications:    CHF  History:        Patient has prior history of Echocardiogram examinations, most                 recent 02/11/2018. Signs/Symptoms:Shortness of Breath; Risk                 Factors:Hypertension.  Sonographer:    [place] Referring Phys: [id number] [place]  1. Left ventricular ejection fraction, by estimation, is 60 to 65%. The left ventricle has normal function. The left ventricle has no regional wall motion abnormalities. There is moderate concentric left ventricular hypertrophy. Left ventricular diastolic parameters are consistent with Grade I diastolic dysfunction (impaired relaxation).  2. Right ventricular systolic function is normal. The right ventricular size is normal. There is mildly elevated pulmonary artery systolic pressure.  3. Left atrial size was mildly dilated.  4. The mitral valve is normal in structure. Trivial mitral valve regurgitation. No evidence of mitral stenosis.  5. Tricuspid valve regurgitation is moderate.  6. The aortic valve is tricuspid. Aortic valve regurgitation is not visualized. No aortic stenosis is present.  7. The inferior vena cava is normal in size with greater than 50% respiratory variability, suggesting right atrial pressure of 3 mmHg. FINDINGS  Left Ventricle: Left ventricular ejection fraction, by estimation, is 60 to 65%. The left ventricle has normal function. The left ventricle has no regional wall motion abnormalities. The left ventricular internal cavity size was normal in size. There is  moderate concentric left ventricular hypertrophy. Left ventricular diastolic parameters are consistent with Grade I diastolic dysfunction (impaired relaxation). Right Ventricle: The right ventricular size is normal. No increase in right ventricular wall thickness. Right ventricular systolic function is normal. There is mildly elevated pulmonary artery systolic pressure. The tricuspid regurgitant velocity is 3.07  m/s, and  with an assumed right atrial pressure of 3 mmHg, the estimated right ventricular systolic pressure is 44.3 mmHg. Left Atrium: Left atrial size was mildly dilated. Right Atrium: Right atrial size was normal in size. Pericardium: There is no evidence of pericardial effusion. Mitral Valve: The mitral valve is normal in structure. Mild mitral annular calcification. Trivial mitral valve regurgitation. No evidence of mitral valve stenosis. MV peak gradient, 9.9 mmHg. The mean mitral valve gradient is 5.0 mmHg. Tricuspid Valve: The tricuspid valve is normal in structure. Tricuspid valve regurgitation is moderate . No evidence of tricuspid stenosis. Aortic Valve: The aortic valve is tricuspid. Aortic valve regurgitation is not visualized. No aortic stenosis is present. Aortic valve mean gradient measures 6.0 mmHg. Aortic valve peak gradient measures 9.7 mmHg. Aortic valve area, by VTI measures 3.01 cm. Pulmonic Valve: The pulmonic valve was normal in structure.  Pulmonic valve regurgitation is trivial. No evidence of pulmonic stenosis. Aorta: The aortic root is normal in size and structure. Venous: The inferior vena cava is normal in size with greater than 50% respiratory variability, suggesting right atrial pressure of 3 mmHg. IAS/Shunts: No atrial level shunt detected by color flow Doppler.  LEFT VENTRICLE PLAX 2D LVIDd:         5.10 cm     Diastology LVIDs:         2.90 cm     LV e' medial:    7.40 cm/s LV PW:         1.30 cm     LV E/e' medial:  17.7 LV IVS:        1.40 cm     LV e' lateral:   7.40 cm/s LVOT diam:     2.20 cm     LV E/e' lateral: 17.7 LV SV:         88 LV SV Index:   47 LVOT Area:     3.80 cm  LV Volumes (MOD) LV vol d, MOD A2C: 82.1 ml LV vol d, MOD A4C: 69.5 ml LV vol s, MOD A2C: 25.4 ml LV vol s, MOD A4C: 35.6 ml LV SV MOD A2C:     56.7 ml LV SV MOD A4C:     69.5 ml LV SV MOD BP:      47.4 ml RIGHT VENTRICLE TAPSE (M-mode): 2.6 cm LEFT ATRIUM             Index       RIGHT ATRIUM           Index LA  Vol (A2C):   49.7 ml 26.68 ml/m RA Area:     13.50 cm LA Vol (A4C):   54.7 ml 29.37 ml/m RA Volume:   28.90 ml  15.52 ml/m LA Biplane Vol: 57.5 ml 30.87 ml/m  AORTIC VALVE                    PULMONIC VALVE AV Area (Vmax):    3.53 cm     PV Vmax:       1.07 m/s AV Area (Vmean):   3.29 cm     PV Vmean:      80.700 cm/s AV Area (VTI):     3.01 cm     PV VTI:        0.223 m AV Vmax:           156.00 cm/s  PV Peak grad:  4.6 mmHg AV Vmean:          112.000 cm/s PV Mean grad:  3.0 mmHg AV VTI:            0.292 m AV Peak Grad:      9.7 mmHg AV Mean Grad:      6.0 mmHg LVOT Vmax:         145.00 cm/s LVOT Vmean:        96.900 cm/s LVOT VTI:          0.231 m LVOT/AV VTI ratio: 0.79  AORTA Ao Root diam: 3.00 cm Ao Asc diam:  2.40 cm MITRAL VALVE                 TRICUSPID VALVE MV Area (PHT): 5.02 cm      TR Peak grad:   37.7 mmHg MV Area VTI:   2.67 cm      TR Vmax:  307.00 cm/s MV Peak grad:  9.9 mmHg MV Mean grad:  5.0 mmHg      SHUNTS MV Vmax:       1.57 m/s      Systemic VTI:  0.23 m MV Vmean:      110.0 cm/s    Systemic Diam: 2.20 cm MV Decel Time: 151 msec MR Peak grad:    115.8 mmHg MR Mean grad:    90.0 mmHg MR Vmax:         538.00 cm/s MR Vmean:        463.0 cm/s MR PISA:         0.25 cm MR PISA Eff ROA: 1 mm MR PISA Radius:  0.20 cm MV E velocity: 131.00 cm/s MV A velocity: 159.00 cm/s MV E/A ratio:  0.82 Skeet Latch MD Electronically signed by Skeet Latch MD Signature Date/Time: 10/30/2020/3:14:15 PM    Final    Discharge Instructions: Discharge Instructions    Call MD for:  persistant nausea and vomiting   Complete by: As directed    Call MD for:  severe uncontrolled pain   Complete by: As directed    Diet - low sodium heart healthy   Complete by: As directed    Discharge instructions   Complete by: As directed    Ms. Foster-Obua,  It is a pleasure taking care of you during this admission.  You were hospitalized for weakness and shortness of breath, found to have a large  meningioma in the frontal lobe.  The neurosurgeon has resected the mass.   Please follow-up with [facility] in 1 week for staples removal.  You can apply bacitracin ointment on the wound.  Please give them a call if you do not hear back from them next week.  The phone number should be in the discharge summary.  Please continue you your dialysis schedule and follow-up with your kidney doctor outpatient.  Please stop taking your potassium supplement because your potassium level was high during this admission.  Please talk to your kidney doctor about this.  Please taking all your medication as instructed in the discharge summary.  Please follow-up with the internal medicine clinic in 1 week.  The front desk will call you for appointment.  Take care  Dr. Alfonse Spruce   Discharge wound care:   Complete by: As directed    Can apply bacitracin ointment on the wound.  Follow-up with neurosurgery in 1 week for staples removal   Increase activity slowly   Complete by: As directed       Signed: Gaylan Gerold, DO 11/10/2020, 10:00 AM   Pager: [phone]

## 2020-11-10 NOTE — Progress Notes (Signed)
OT Cancellation Note  Patient Details Name: Ruth Gutierrez MRN: [id number] DOB: 1966-02-19   Cancelled Treatment:    Reason Eval/Treat Not Completed: Patient at procedure or test/ unavailable;Other (comment) pt out of room at HD, will check back as time allows for OT session.  Harley Alto., COTA/L Acute Rehabilitation Services [phone]   Precious Haws 11/10/2020, 9:27 AM

## 2020-11-10 NOTE — Progress Notes (Signed)
   11/10/20 1302  Vitals  Temp 98.1 F (36.7 C)  Temp Source Oral  BP (!) 153/86  BP Location Right Arm  BP Method Automatic  Patient Position (if appropriate) Lying  Pulse Rate 77  Resp 17  Oxygen Therapy  SpO2 98 %  O2 Device Room Air  Dialysis Weight  Weight 72.3 kg  Type of Weight Post-Dialysis  Post-Hemodialysis Assessment  Rinseback Volume (mL) 250 mL  KECN 266 V  Dialyzer Clearance Lightly streaked  Duration of HD Treatment -hour(s) 3.5 hour(s)  Hemodialysis Intake (mL) 500 mL  UF Total -Machine (mL) 4500 mL  Net UF (mL) 4000 mL  Tolerated HD Treatment Yes  Post-Hemodialysis Comments hd complete-tx complete  AVG/AVF Arterial Site Held (minutes) 10 minutes  AVG/AVF Venous Site Held (minutes) 10 minutes  Fistula / Graft Left Upper arm Arteriovenous fistula  No Placement Date or Time found.   Placed prior to admission: Yes  Orientation: Left  Access Location: Upper arm  Access Type: Arteriovenous fistula  Site Condition No complications  Fistula / Graft Assessment Present;Thrill;Bruit  Status Deaccessed  Drainage Description None

## 2020-11-10 NOTE — Progress Notes (Signed)
[facility] Progress Note   Subjective:  Seen on HD, 3.5 kg up today. Doing well.   Objective Vitals:   11/10/20 0945 11/10/20 1000 11/10/20 1030 11/10/20 1100  BP: 132/71 134/84 (!) 150/78 (!) 141/81  Pulse: 70   (!) 58  Resp:      Temp:      TempSrc:      SpO2:      Weight:      Height:       Physical Exam General: Well appearing woman, NAD Heart: RRR; no murmur Lungs: CTAB; no rales Abdomen: soft, non-tender Extremities: No LE edema Dialysis Access:  AVF + bruit  Additional Objective Labs: Basic Metabolic Panel: Recent Labs  Lab 11/04/20 0540 11/04/20 1543 11/05/20 0556 11/06/20 0840 11/08/20 0348 11/09/20 0344 11/10/20 0200  NA 135 135 135   < > 136 133* 134*  K 5.1 4.7 4.7   < > 5.4* 5.2* 4.6  CL 104 104 104   < > 98 95* 97*  CO2 24 24 24    < > 27 27 28   GLUCOSE 115* 247* 91   < > 118* 112* 136*  BUN 25* 23* 26*   < > 25* 42* 19  CREATININE 2.94* 2.96* 3.03*   < > 4.94* 7.16* 5.03*  CALCIUM 7.6* 7.0* 7.7*   < > 8.9 9.1 7.5*  PHOS 4.8* 3.7 4.1  --   --   --   --    < > = values in this interval not displayed.   Liver Function Tests: Recent Labs  Lab 11/04/20 0540 11/04/20 1543 11/05/20 0556  ALBUMIN 2.5* 2.4* 2.5*   CBC: Recent Labs  Lab 11/05/20 0903 11/06/20 0840 11/07/20 0422 11/08/20 0627 11/09/20 0344 11/10/20 0200  WBC 11.4* 11.7* 10.0 9.3 9.0 12.4*  NEUTROABS 8.8*  --   --   --   --   --   HGB 8.5* 8.2* 7.7* 8.5* 8.1* 8.3*  HCT 26.3* 25.6* 23.9* 26.5* 24.4* 25.8*  MCV 96.3 97.0 97.6 96.0 95.3 96.3  PLT 140* 147* 137* 142* 124* 159   Medications: . sodium chloride Stopped (11/05/20 2127)   . amLODipine  10 mg Oral Daily  . carvedilol  12.5 mg Oral BID WC  . Chlorhexidine Gluconate Cloth  6 each Topical Q0600  . cinacalcet  180 mg Oral Q M,W,F  . cloNIDine  0.2 mg Oral TID  . darbepoetin (ARANESP) injection - DIALYSIS  100 mcg Intravenous Q Wed-HD  . dexamethasone  1 mg Oral Q12H   Followed by  . [START ON  11/11/2020] dexamethasone  1 mg Oral Daily  . docusate sodium  100 mg Oral BID  . doxercalciferol  5 mcg Oral Once per day on Mon Wed Fri  . levETIRAcetam  250 mg Oral BID  . lidocaine-prilocaine   Topical Q M,W,F-HD  . pantoprazole  40 mg Oral Daily  . polyethylene glycol  17 g Oral BID  . senna-docusate  2 tablet Oral BID  . sodium chloride flush  10-40 mL Intracatheter Q12H  . sucroferric oxyhydroxide  1,000 mg Oral TID WC    Dialysis Orders: MWF @ [facility]   4h  400/500  73.5kg  2/2 bath  AVF no heparin (post craniotomy)   - sensipar 180 mwf  - hect 5 ug mwf  - iron 50 weekly  - mircera 75 q2 due 4/4  Assessment/Plan: 1. AMS/Olfactory groove meningioma: S/p excision 3/31. Much better. Per neurosurgery. For dc today.  2.  ESRD:S/p CRRT 4/1 - 4/4. Usual HD MWF. No heparin for 10- 14 days. Extra HD today for vol overload, as below.  3. Volume/ hypertension:BP's better w/ vol down. Should be at or below dry wt after HD this am.  Will lower if needed (LBW loss due to acute illness).  4. Anemia of ESRD:Hgb 8.5 - continue Aranesp 163mcg q Wed 5. Secondary Hyperparathyroidism/Hyperphosphatemia:Continue home sensipar 180mg  and hectorol 34mcg w/ dialysis. On velphoro 1000 w/ meals. 6. Dispo - for dc after HD today. Questions answered.    Kelly Splinter, MD 11/10/2020, 11:05 AM

## 2020-11-11 ENCOUNTER — Telehealth: Payer: Self-pay | Admitting: Nephrology

## 2020-11-11 NOTE — Telephone Encounter (Signed)
Transition of Care Contact from [place]   Date of Discharge: 11/10/20 Date of Contact: 11/11/20 Method of contact: phone Talked to patient   Patient contacted to discuss transition of care form recent hospitaliztion. Patient was admitted to [facility] from 3/28 to 11/10/20 with the discharge diagnosis of large anterior skull base meningioma s/p resection on 3/31, and hyperactive delirium.     Medication changes were reviewed - advised she would be given sensipar and Hectorol in center and does not need to take at home.   Patient will follow up with is outpatient dialysis center 11/12/20.   Other follow up needs include none identified.    Jen Mow, PA-C [facility] Pager: [phone]

## 2020-11-12 ENCOUNTER — Telehealth: Payer: Self-pay

## 2020-11-12 DIAGNOSIS — Z992 Dependence on renal dialysis: Secondary | ICD-10-CM | POA: Diagnosis not present

## 2020-11-12 DIAGNOSIS — N186 End stage renal disease: Secondary | ICD-10-CM | POA: Diagnosis not present

## 2020-11-12 DIAGNOSIS — D509 Iron deficiency anemia, unspecified: Secondary | ICD-10-CM | POA: Diagnosis not present

## 2020-11-12 DIAGNOSIS — E1129 Type 2 diabetes mellitus with other diabetic kidney complication: Secondary | ICD-10-CM | POA: Diagnosis not present

## 2020-11-12 DIAGNOSIS — D631 Anemia in chronic kidney disease: Secondary | ICD-10-CM | POA: Diagnosis not present

## 2020-11-12 DIAGNOSIS — N2581 Secondary hyperparathyroidism of renal origin: Secondary | ICD-10-CM | POA: Diagnosis not present

## 2020-11-12 NOTE — Telephone Encounter (Signed)
TOC HFU 11/20/2020 at 9:45 am with Dr. Marva Panda.  Just spoke with patient and she agreed with appointment date and time.

## 2020-11-12 NOTE — Telephone Encounter (Signed)
-----   Message from Gaylan Gerold, DO sent at 11/11/2020  6:45 AM EDT ----- Hello,   Can you please set up a hospital follow up for this patient is 1 week?  Thank you  Gaylan Gerold

## 2020-11-14 DIAGNOSIS — N2581 Secondary hyperparathyroidism of renal origin: Secondary | ICD-10-CM | POA: Diagnosis not present

## 2020-11-14 DIAGNOSIS — E1129 Type 2 diabetes mellitus with other diabetic kidney complication: Secondary | ICD-10-CM | POA: Diagnosis not present

## 2020-11-14 DIAGNOSIS — D631 Anemia in chronic kidney disease: Secondary | ICD-10-CM | POA: Diagnosis not present

## 2020-11-14 DIAGNOSIS — Z992 Dependence on renal dialysis: Secondary | ICD-10-CM | POA: Diagnosis not present

## 2020-11-14 DIAGNOSIS — N186 End stage renal disease: Secondary | ICD-10-CM | POA: Diagnosis not present

## 2020-11-14 DIAGNOSIS — D509 Iron deficiency anemia, unspecified: Secondary | ICD-10-CM | POA: Diagnosis not present

## 2020-11-14 DIAGNOSIS — G9340 Encephalopathy, unspecified: Secondary | ICD-10-CM | POA: Diagnosis not present

## 2020-11-14 DIAGNOSIS — I120 Hypertensive chronic kidney disease with stage 5 chronic kidney disease or end stage renal disease: Secondary | ICD-10-CM | POA: Diagnosis not present

## 2020-11-16 DIAGNOSIS — Z992 Dependence on renal dialysis: Secondary | ICD-10-CM | POA: Diagnosis not present

## 2020-11-16 DIAGNOSIS — N186 End stage renal disease: Secondary | ICD-10-CM | POA: Diagnosis not present

## 2020-11-16 DIAGNOSIS — E1129 Type 2 diabetes mellitus with other diabetic kidney complication: Secondary | ICD-10-CM | POA: Diagnosis not present

## 2020-11-16 DIAGNOSIS — N2581 Secondary hyperparathyroidism of renal origin: Secondary | ICD-10-CM | POA: Diagnosis not present

## 2020-11-16 DIAGNOSIS — D509 Iron deficiency anemia, unspecified: Secondary | ICD-10-CM | POA: Diagnosis not present

## 2020-11-16 DIAGNOSIS — D631 Anemia in chronic kidney disease: Secondary | ICD-10-CM | POA: Diagnosis not present

## 2020-11-19 DIAGNOSIS — N2581 Secondary hyperparathyroidism of renal origin: Secondary | ICD-10-CM | POA: Diagnosis not present

## 2020-11-19 DIAGNOSIS — E1129 Type 2 diabetes mellitus with other diabetic kidney complication: Secondary | ICD-10-CM | POA: Diagnosis not present

## 2020-11-19 DIAGNOSIS — D631 Anemia in chronic kidney disease: Secondary | ICD-10-CM | POA: Diagnosis not present

## 2020-11-19 DIAGNOSIS — D509 Iron deficiency anemia, unspecified: Secondary | ICD-10-CM | POA: Diagnosis not present

## 2020-11-19 DIAGNOSIS — Z992 Dependence on renal dialysis: Secondary | ICD-10-CM | POA: Diagnosis not present

## 2020-11-19 DIAGNOSIS — N186 End stage renal disease: Secondary | ICD-10-CM | POA: Diagnosis not present

## 2020-11-20 ENCOUNTER — Encounter: Payer: Self-pay | Admitting: Internal Medicine

## 2020-11-20 ENCOUNTER — Ambulatory Visit (INDEPENDENT_AMBULATORY_CARE_PROVIDER_SITE_OTHER): Payer: Medicare Other | Admitting: Internal Medicine

## 2020-11-20 ENCOUNTER — Other Ambulatory Visit: Payer: Self-pay

## 2020-11-20 VITALS — BP 117/78 | HR 84 | Temp 98.8°F | Ht 67.0 in | Wt 164.4 lb

## 2020-11-20 DIAGNOSIS — Z9889 Other specified postprocedural states: Secondary | ICD-10-CM

## 2020-11-20 DIAGNOSIS — Z1231 Encounter for screening mammogram for malignant neoplasm of breast: Secondary | ICD-10-CM | POA: Insufficient documentation

## 2020-11-20 DIAGNOSIS — Z1159 Encounter for screening for other viral diseases: Secondary | ICD-10-CM

## 2020-11-20 DIAGNOSIS — Z0000 Encounter for general adult medical examination without abnormal findings: Secondary | ICD-10-CM | POA: Diagnosis not present

## 2020-11-20 DIAGNOSIS — I10 Essential (primary) hypertension: Secondary | ICD-10-CM | POA: Diagnosis not present

## 2020-11-20 DIAGNOSIS — Z86018 Personal history of other benign neoplasm: Secondary | ICD-10-CM

## 2020-11-20 MED ORDER — IBUPROFEN 600 MG PO TABS: 600.0000 mg | ORAL_TABLET | Freq: Three times a day (TID) | ORAL | 0 refills | Status: DC | PRN

## 2020-11-20 NOTE — Assessment & Plan Note (Signed)
-   hep C antibody

## 2020-11-20 NOTE — Progress Notes (Signed)
   CC: Hospital follow up after meningioma resection, hypertension, encounter for screening mammogram for malignant neoplasm of breast, and healthcare maintenance ( referral to dentist, referral to opthalmologist) .  HPI:[name] is a 55 y.o. female who presents for evaluation of hypertension, hospital follow up following resection of meningioma, referral to dentist, and referral to opthalmology. Please see individual problem based A/P for details.  Depression, PHQ-9: Based on the patients  [place] Visit from 11/20/2020 in [place]  PHQ-9 Total Score 2     score we have does not suggest depression.  Past Medical History:  Diagnosis Date  . Anemia of chronic disease   . Arthritis   . Deceased-donor kidney transplant    Performed at [facility], April 2010.  Initial ESRD due to HTN nephropathy  . Eczema   . ESRD (end stage renal disease) ([place])    s/p transplant creatinine baseline 1.1  M/W/F dialysis  . FUO (fever of unknown origin) 05/17/2015  . GERD (gastroesophageal reflux disease)   . Headache(784.0)   . History of hyperparathyroidism   . Hypertension   . Peritonitis ([place]) 10/2019  . Shortness of breath   . Wears glasses    Review of Systems:   Review of Systems  Constitutional: Negative for chills and fever.  Eyes: Negative for blurred vision and double vision.  Neurological: Positive for weakness. Negative for dizziness and focal weakness.     Physical Exam: Vitals:   11/20/20 0959  BP: 117/78  Pulse: 84  Temp: 98.8 F (37.1 C)  TempSrc: Oral  SpO2: 100%  Weight: 164 lb 6.4 oz (74.6 kg)  Height: 5\' 7"  (1.702 m)   General: middle age women , normal weight, in no acute distress HEENT: Large incision from craniotomy, staples in place, no drainage, or erythema. Cardiovascular: Normal rate, regular rhythm.  No murmurs, rubs, or gallops Pulmonary : Equal breath sounds, No wheezes, rales, or rhonchi Abdominal:  soft, nontender,  bowel sounds present Ext: Trace edema in ankles. Warm extremities  Neuro: Alert and oriented x4 , ambulating normal  Assessment & Plan:   See Encounters Tab for problem based charting.  Patient discussed with Dr. Angelia Mould

## 2020-11-20 NOTE — Assessment & Plan Note (Signed)
Hypertension: Patient's BP today is 117/78  with a goal of <140/80. The patient endorses adherence to her medication regimen. Denies any denied  lightheadedness, or dizziness on standing,. She has had some trace edema in the feet and ankles.   Plan: Continue amlodipine 10 mg daily Continue Coreg 12.5 mg BID Continue Clonidine  .2 mg TID

## 2020-11-20 NOTE — Assessment & Plan Note (Addendum)
Patient has not been to hospital follow up with neurosurgeon. Neurosurgery attempted to schedule on the 11th but patients mailbox was full. Was able to arrange appoitment for Monday April 25th at 2:45. Staples are in place. Patient reports pain from suture line, sutures line appears normal on exam. Recommended tylenol and wrote for Ibuprofen (Patient is ESRD and aneuric). She has finished dexamethasone and Keppra. Blood pressure well controlled, continue current regimen.  She has felt weaker than normal since surgery, no focal neurological deficits.. Denies any new symptoms at home.  - Follow up with Dr. Marcello Moores on Monday, 25th of April

## 2020-11-20 NOTE — Patient Instructions (Addendum)
1. Healthcare maintenance  - Ambulatory referral to Ophthalmology - Ambulatory referral to Dentistry - Hepatitis C antibody  2. Encounter for screening mammogram for malignant neoplasm of breast - MM Digital Screening; Future  3. Large anterior skull base meningiomas/presection3/31/2022  Follow up scheduled with Dr. Duffy Rhody , [facility]. Monday April 25th at 2:45

## 2020-11-20 NOTE — Assessment & Plan Note (Signed)
-    Screening mammogram for breast cancer placed

## 2020-11-21 DIAGNOSIS — E1129 Type 2 diabetes mellitus with other diabetic kidney complication: Secondary | ICD-10-CM | POA: Diagnosis not present

## 2020-11-21 DIAGNOSIS — D631 Anemia in chronic kidney disease: Secondary | ICD-10-CM | POA: Diagnosis not present

## 2020-11-21 DIAGNOSIS — N186 End stage renal disease: Secondary | ICD-10-CM | POA: Diagnosis not present

## 2020-11-21 DIAGNOSIS — N2581 Secondary hyperparathyroidism of renal origin: Secondary | ICD-10-CM | POA: Diagnosis not present

## 2020-11-21 DIAGNOSIS — Z992 Dependence on renal dialysis: Secondary | ICD-10-CM | POA: Diagnosis not present

## 2020-11-21 DIAGNOSIS — D509 Iron deficiency anemia, unspecified: Secondary | ICD-10-CM | POA: Diagnosis not present

## 2020-11-21 LAB — HEPATITIS C ANTIBODY: Hep C Virus Ab: <0.1 s/co ratio (ref 0.0–0.9)

## 2020-11-22 NOTE — Progress Notes (Signed)
Internal Medicine Clinic Attending  Case discussed with Dr. Steen  At the time of the visit.  We reviewed the resident's history and exam and pertinent patient test results.  I agree with the assessment, diagnosis, and plan of care documented in the resident's note.  

## 2020-11-23 DIAGNOSIS — Z992 Dependence on renal dialysis: Secondary | ICD-10-CM | POA: Diagnosis not present

## 2020-11-23 DIAGNOSIS — D509 Iron deficiency anemia, unspecified: Secondary | ICD-10-CM | POA: Diagnosis not present

## 2020-11-23 DIAGNOSIS — D631 Anemia in chronic kidney disease: Secondary | ICD-10-CM | POA: Diagnosis not present

## 2020-11-23 DIAGNOSIS — E1129 Type 2 diabetes mellitus with other diabetic kidney complication: Secondary | ICD-10-CM | POA: Diagnosis not present

## 2020-11-23 DIAGNOSIS — N186 End stage renal disease: Secondary | ICD-10-CM | POA: Diagnosis not present

## 2020-11-23 DIAGNOSIS — N2581 Secondary hyperparathyroidism of renal origin: Secondary | ICD-10-CM | POA: Diagnosis not present

## 2020-11-26 DIAGNOSIS — D631 Anemia in chronic kidney disease: Secondary | ICD-10-CM | POA: Diagnosis not present

## 2020-11-26 DIAGNOSIS — N186 End stage renal disease: Secondary | ICD-10-CM | POA: Diagnosis not present

## 2020-11-26 DIAGNOSIS — Z992 Dependence on renal dialysis: Secondary | ICD-10-CM | POA: Diagnosis not present

## 2020-11-26 DIAGNOSIS — D509 Iron deficiency anemia, unspecified: Secondary | ICD-10-CM | POA: Diagnosis not present

## 2020-11-26 DIAGNOSIS — N2581 Secondary hyperparathyroidism of renal origin: Secondary | ICD-10-CM | POA: Diagnosis not present

## 2020-11-26 DIAGNOSIS — E1129 Type 2 diabetes mellitus with other diabetic kidney complication: Secondary | ICD-10-CM | POA: Diagnosis not present

## 2020-11-28 DIAGNOSIS — E1129 Type 2 diabetes mellitus with other diabetic kidney complication: Secondary | ICD-10-CM | POA: Diagnosis not present

## 2020-11-28 DIAGNOSIS — N2581 Secondary hyperparathyroidism of renal origin: Secondary | ICD-10-CM | POA: Diagnosis not present

## 2020-11-28 DIAGNOSIS — D509 Iron deficiency anemia, unspecified: Secondary | ICD-10-CM | POA: Diagnosis not present

## 2020-11-28 DIAGNOSIS — Z992 Dependence on renal dialysis: Secondary | ICD-10-CM | POA: Diagnosis not present

## 2020-11-28 DIAGNOSIS — N186 End stage renal disease: Secondary | ICD-10-CM | POA: Diagnosis not present

## 2020-11-28 DIAGNOSIS — D631 Anemia in chronic kidney disease: Secondary | ICD-10-CM | POA: Diagnosis not present

## 2020-11-30 DIAGNOSIS — N186 End stage renal disease: Secondary | ICD-10-CM | POA: Diagnosis not present

## 2020-11-30 DIAGNOSIS — Z992 Dependence on renal dialysis: Secondary | ICD-10-CM | POA: Diagnosis not present

## 2020-11-30 DIAGNOSIS — D509 Iron deficiency anemia, unspecified: Secondary | ICD-10-CM | POA: Diagnosis not present

## 2020-11-30 DIAGNOSIS — D631 Anemia in chronic kidney disease: Secondary | ICD-10-CM | POA: Diagnosis not present

## 2020-11-30 DIAGNOSIS — E1129 Type 2 diabetes mellitus with other diabetic kidney complication: Secondary | ICD-10-CM | POA: Diagnosis not present

## 2020-11-30 DIAGNOSIS — N2581 Secondary hyperparathyroidism of renal origin: Secondary | ICD-10-CM | POA: Diagnosis not present

## 2020-12-04 ENCOUNTER — Encounter: Payer: Self-pay | Admitting: Student

## 2020-12-04 ENCOUNTER — Ambulatory Visit (INDEPENDENT_AMBULATORY_CARE_PROVIDER_SITE_OTHER): Payer: Medicare Other | Admitting: Student

## 2020-12-04 ENCOUNTER — Other Ambulatory Visit (HOSPITAL_COMMUNITY)
Admission: RE | Admit: 2020-12-04 | Discharge: 2020-12-04 | Disposition: A | Discharge status: Home / Self Care | Payer: Medicare Other | Source: Ambulatory Visit | Attending: Internal Medicine | Admitting: Internal Medicine

## 2020-12-04 VITALS — BP 129/73 | HR 70 | Temp 98.4°F | Wt 165.2 lb

## 2020-12-04 DIAGNOSIS — M25552 Pain in left hip: Secondary | ICD-10-CM | POA: Diagnosis not present

## 2020-12-04 DIAGNOSIS — Z0000 Encounter for general adult medical examination without abnormal findings: Secondary | ICD-10-CM

## 2020-12-04 DIAGNOSIS — Z1151 Encounter for screening for human papillomavirus (HPV): Secondary | ICD-10-CM | POA: Insufficient documentation

## 2020-12-04 DIAGNOSIS — I10 Essential (primary) hypertension: Secondary | ICD-10-CM | POA: Diagnosis not present

## 2020-12-04 DIAGNOSIS — Z124 Encounter for screening for malignant neoplasm of cervix: Secondary | ICD-10-CM

## 2020-12-04 NOTE — Assessment & Plan Note (Signed)
Pap Smear today.

## 2020-12-04 NOTE — Assessment & Plan Note (Addendum)
Reports left lower back pain and left hip pain that started after the surgery in March 31.  States that pain is worse with walking and better with rest.  She has been taking ibuprofen 600 mg 1 twice a day for 2 weeks without any significant relief.  She denies bowel incontinence, fever, chills, extremity weakness, loss of sensation.   Physical exam is unremarkable.  She has normal strength and sensation of bilateral lower extremity.  +2 pedis pulses palpated bilaterally.  She got mild diminished patella deep tendon reflex on the left side.  No erythema, bruises, hematoma or fluctuant mass palpated on the lower back over the left greater trochanter.  She does have mild tenderness to palpation in the lateral greater trochanter.  Assessment and plan Her back pain is likely due to post lumbar puncture back pain with a component of immobility due to long hospital course.  Her left hip pain is likely greater trochanteric pain syndrome due to similar reason.  Patient is taking ibuprofen and which is not ideal due to her ESRD.  Will advise patient to take scheduled Tylenol 1 g 3 times daily for the next few days.  Also recommended RICE therapy and stretching exercise.  She will call the clinic if pain is not improving.  -Stop ibuprofen -Tylenol 1 g 3 times daily -RICE therapy -Gave patient handout for back stretching exercise

## 2020-12-04 NOTE — Progress Notes (Signed)
   CC: Hypertension follow-up and lower back pain  HPI:  [name] is a 55 y.o. with past medical history of hypertension, ESRD on dialysis, meningioma status post resection, who presented to the clinic today for blood pressure recheck and low back pain.  Please see his problem based charting for further detail  Past Medical History:  Diagnosis Date  . Anemia of chronic disease   . Arthritis   . Deceased-donor kidney transplant    Performed at [facility], April 2010.  Initial ESRD due to HTN nephropathy  . Eczema   . ESRD (end stage renal disease) ([place])    s/p transplant creatinine baseline 1.1  M/W/F dialysis  . FUO (fever of unknown origin) 05/17/2015  . GERD (gastroesophageal reflux disease)   . Headache(784.0)   . History of hyperparathyroidism   . Hypertension   . Peritonitis ([place]) 10/2019  . Shortness of breath   . Wears glasses    Review of Systems: As per HPI  Physical Exam:  Vitals:   12/04/20 1412  BP: 129/73  Pulse: 70  Temp: 98.4 F (36.9 C)  TempSrc: Oral  SpO2: 100%  Weight: 165 lb 3.2 oz (74.9 kg)   Physical Exam Constitutional:      General: She is not in acute distress.    Appearance: She is not toxic-appearing.  HENT:     Head:     Comments: Surgical scar appears clean and noninfected. Staples were removed Eyes:     General: No scleral icterus.       Right eye: No discharge.        Left eye: No discharge.     Conjunctiva/sclera: Conjunctivae normal.  Cardiovascular:     Rate and Rhythm: Normal rate and regular rhythm.     Heart sounds: Normal heart sounds. No murmur heard.     Comments: No LE edema Pulmonary:     Effort: Pulmonary effort is normal. No respiratory distress.     Breath sounds: No wheezing.  Musculoskeletal:     Comments: Normal range of motion of bilateral hip and knee joints +2 pedis pulses bilaterally 5/5 strength bilateral UE and LE Sensation intact +2 patellar deep tendon reflex on the right and +1  on the left No erythema, bruises, hematoma or fluctuant mass palpated of lower back or left greater trochanter.  Mild tenderness to palpation in the left lower lumbar and lateral of greater trochanter.  Neurological:     Mental Status: She is alert.  Psychiatric:        Mood and Affect: Mood normal.        Thought Content: Thought content normal.        Judgment: Judgment normal.     Assessment & Plan:   See Encounters Tab for problem based charting.  Patient discussed with Dr. Dareen Piano

## 2020-12-04 NOTE — Assessment & Plan Note (Addendum)
Patient reports feeling dizzy in the morning after taking her blood pressure medications.  It takes a long time for her to feel better.  Patient reports compliant with medication.  States that her blood pressure is low at dialysis center but she does not remember the readings.   Blood pressure at goal today 129/73.  Goal < 140/80  Assessment and Plan I will reach out to the dialysis center to obtain medical records about her blood pressure.  If low, will titrate down her clonidine from 0.2 mg TID to BID and have her come back in 2 weeks for blood pressure recheck.  -Continue amlodipine 10 mg and Coreg 12.5 mg BID -Continue clonidine 0.2 mg TID  Addendum Per reports from dialysis center, patient did have a few episode of hypotension with systolic in the 60-60O.  Will reduce clonidine from 0.2 mg TID to BID.  We will titrate clonidine down slowly to prevent rebound hypertension.  -Recheck blood pressure at follow-up visit

## 2020-12-04 NOTE — Patient Instructions (Signed)
Ruth Gutierrez,  It was nice seeing you in the clinic today.  Here is a summary of what we talked about:  1.  High blood pressure: I will get in touch with your dialysis center to get your medical record.  If your blood pressure is low, I will slowly reduce the dose of your clonidine.  2.  Lower back pain: This is likely complication of the lumbar puncture.  Ibuprofen is not a great medication due to your kidney disease.  Please start taking Tylenol 1000 mg (2 extra strength tablets) 3 times a day for the next 5-7 days.  I also printed out a handout of stretching exercise for lower back pain.  3.  Left hip pain: This is likely greater trochanteric pain syndrome, which is an inflammation of the outside of your hip joint.  Pain regimen as above.  Take care  Dr. Alfonse Spruce

## 2020-12-05 LAB — CYTOLOGY - PAP
Comment: NEGATIVE
Diagnosis: NEGATIVE
High risk HPV: NEGATIVE

## 2020-12-06 NOTE — Progress Notes (Signed)
Internal Medicine Clinic Attending  Case discussed with Dr. Nguyen  At the time of the visit.  We reviewed the resident's history and exam and pertinent patient test results.  I agree with the assessment, diagnosis, and plan of care documented in the resident's note. 

## 2020-12-06 NOTE — Addendum Note (Signed)
Addended byGaylan Gerold on: 12/06/2020 01:30 PM   Modules accepted: Level of Service

## 2020-12-13 ENCOUNTER — Other Ambulatory Visit: Payer: Self-pay | Admitting: Student

## 2020-12-18 ENCOUNTER — Encounter (INDEPENDENT_AMBULATORY_CARE_PROVIDER_SITE_OTHER): Payer: Medicare Other

## 2020-12-18 ENCOUNTER — Ambulatory Visit (INDEPENDENT_AMBULATORY_CARE_PROVIDER_SITE_OTHER): Payer: Medicare Other | Admitting: Nurse Practitioner

## 2020-12-18 ENCOUNTER — Encounter (INDEPENDENT_AMBULATORY_CARE_PROVIDER_SITE_OTHER): Payer: Self-pay

## 2020-12-20 ENCOUNTER — Other Ambulatory Visit: Payer: Self-pay

## 2020-12-20 ENCOUNTER — Ambulatory Visit (INDEPENDENT_AMBULATORY_CARE_PROVIDER_SITE_OTHER): Payer: Medicare Other | Admitting: Student

## 2020-12-20 ENCOUNTER — Encounter: Payer: Self-pay | Admitting: Student

## 2020-12-20 VITALS — BP 109/59 | HR 85 | Temp 98.7°F | Ht 67.0 in | Wt 164.0 lb

## 2020-12-20 DIAGNOSIS — N186 End stage renal disease: Secondary | ICD-10-CM

## 2020-12-20 DIAGNOSIS — R739 Hyperglycemia, unspecified: Secondary | ICD-10-CM

## 2020-12-20 DIAGNOSIS — Z992 Dependence on renal dialysis: Secondary | ICD-10-CM | POA: Diagnosis not present

## 2020-12-20 DIAGNOSIS — Z23 Encounter for immunization: Secondary | ICD-10-CM

## 2020-12-20 DIAGNOSIS — Z0000 Encounter for general adult medical examination without abnormal findings: Secondary | ICD-10-CM | POA: Diagnosis not present

## 2020-12-20 DIAGNOSIS — I10 Essential (primary) hypertension: Secondary | ICD-10-CM

## 2020-12-20 LAB — GLUCOSE, CAPILLARY: Glucose-Capillary: 90 mg/dL (ref 70–99)

## 2020-12-20 LAB — POCT GLYCOSYLATED HEMOGLOBIN (HGB A1C): Hemoglobin A1C: 5 % (ref 4.0–5.6)

## 2020-12-20 MED ORDER — CARVEDILOL 12.5 MG PO TABS: 12.5000 mg | ORAL_TABLET | Freq: Two times a day (BID) | ORAL | 3 refills | Status: DC

## 2020-12-20 MED ORDER — CLONIDINE HCL 0.1 MG PO TABS: 0.1000 mg | ORAL_TABLET | Freq: Two times a day (BID) | ORAL | 2 refills | Status: DC

## 2020-12-20 NOTE — Progress Notes (Signed)
   CC: Follow-up on hypertension HPI:  Ruth Gutierrez is a 55 y.o. with past medical history of hypertension, ESRD on dialysis, meningioma status post resection, who presented to clinic today for blood pressure recheck.  Please see problem based charting for details.  Past Medical History:  Diagnosis Date  . Anemia of chronic disease   . Arthritis   . Deceased-donor kidney transplant    Performed at [facility], April 2010.  Initial ESRD due to HTN nephropathy  . Eczema   . ESRD (end stage renal disease) ([place])    s/p transplant creatinine baseline 1.1  M/W/F dialysis  . FUO (fever of unknown origin) 05/17/2015  . GERD (gastroesophageal reflux disease)   . Headache(784.0)   . History of hyperparathyroidism   . Hypertension   . Peritonitis ([place]) 10/2019  . Shortness of breath   . Wears glasses    Review of Systems: As per HPI  Physical Exam:  Vitals:   12/20/20 0907  BP: (!) 109/59  Pulse: 85  Temp: 98.7 F (37.1 C)  TempSrc: Oral  SpO2: 100%  Weight: 164 lb (74.4 kg)  Height: 5\' 7"  (1.702 m)   Physical Exam Constitutional:      General: She is not in acute distress.    Appearance: She is not toxic-appearing.  HENT:     Head: Normocephalic.  Eyes:     General: No scleral icterus.       Right eye: No discharge.        Left eye: No discharge.     Conjunctiva/sclera: Conjunctivae normal.  Cardiovascular:     Rate and Rhythm: Normal rate and regular rhythm.     Heart sounds: Normal heart sounds.     Comments: No LE edema Pulmonary:     Effort: Pulmonary effort is normal. No respiratory distress.     Breath sounds: No wheezing.  Neurological:     Mental Status: She is alert.  Psychiatric:        Mood and Affect: Mood normal.        Thought Content: Thought content normal.        Judgment: Judgment normal.     Assessment & Plan:   See Encounters Tab for problem based charting.  Patient discussed with Dr. Dareen Piano

## 2020-12-20 NOTE — Assessment & Plan Note (Signed)
Follow dialysis schedule.  We will obtain records from her nephrology for recent labs.

## 2020-12-20 NOTE — Patient Instructions (Addendum)
Ms. Ruth Gutierrez,  It was nice seeing you in the clinic today.  Here is a summary of what we talked about:  1.  High blood pressure: Your blood pressure still on the lower side today.  I will decrease the Clonidine to 0.1 mg twice daily.   Please return in 2-3 weeks for [place] to recheck your blood pressure.   2. I will check your A1C today.   Take care,  Dr. Alfonse Spruce

## 2020-12-20 NOTE — Assessment & Plan Note (Signed)
-   Tdap vaccine today -Have received 3 shots of COVID vaccines -Obtain A1c

## 2020-12-20 NOTE — Assessment & Plan Note (Signed)
Patient presented here for 2 weeks follow-up.  Her clonidine was decreased from 0.2 3 times daily to twice daily for low blood pressure at dialysis center.  Patient states that she has felt better after the dose is changed.  Denies dizziness and lightheadedness.  Still endorses mild dizziness with changing position from sitting to standing.  States that her systolic blood pressure at that center are greater than 100.  Assessment and plan Initial blood pressure 109/59 Lying: 99/60, P 80 Sitting: 102/50, P 74 Standing: 99/68, P 90 3 min standing: 109/68, P 88  Given her soft blood pressure and orthostatic hypotension, will decrease the clonidine from 0.2 to 0.1 mg twice daily.   -Continue amlodipine and Coreg -Clonidine 0.1 mg twice daily -Follow-up in 2 weeks for blood pressure recheck

## 2020-12-21 NOTE — Progress Notes (Signed)
Internal Medicine Clinic Attending  Case discussed with Dr. Nguyen  At the time of the visit.  We reviewed the resident's history and exam and pertinent patient test results.  I agree with the assessment, diagnosis, and plan of care documented in the resident's note. 

## 2020-12-25 ENCOUNTER — Ambulatory Visit (INDEPENDENT_AMBULATORY_CARE_PROVIDER_SITE_OTHER): Payer: Medicare Other | Admitting: Vascular Surgery

## 2020-12-25 ENCOUNTER — Encounter (INDEPENDENT_AMBULATORY_CARE_PROVIDER_SITE_OTHER): Payer: Medicare Other | Admitting: Nurse Practitioner

## 2021-01-03 ENCOUNTER — Encounter: Payer: Self-pay | Admitting: Internal Medicine

## 2021-01-03 ENCOUNTER — Other Ambulatory Visit: Payer: Self-pay

## 2021-01-03 ENCOUNTER — Other Ambulatory Visit: Payer: Self-pay | Admitting: Internal Medicine

## 2021-01-03 ENCOUNTER — Ambulatory Visit (INDEPENDENT_AMBULATORY_CARE_PROVIDER_SITE_OTHER): Payer: Medicare Other | Admitting: Internal Medicine

## 2021-01-03 DIAGNOSIS — I10 Essential (primary) hypertension: Secondary | ICD-10-CM | POA: Diagnosis not present

## 2021-01-03 NOTE — Patient Instructions (Signed)
Ruth Gutierrez,   Thanks for seeing me today. Here are my recommendations today  1)Please decrease the clonidine to 0.51md DAILY and continue to check your blood pressure. The goal is <130/80. Hold all your blood pressure medications before dialysis.   We will call you in 2 weeks to check your Bps.  Take care!

## 2021-01-03 NOTE — Progress Notes (Signed)
   CC: F/u Soft blood pressures  HPI:  [name] is a 55 y.o. with medical history significant for hypertension here to follow-up on low blood pressures.  Please see problem based charting for further details.  Past Medical History:  Diagnosis Date  . Anemia of chronic disease   . Arthritis   . Deceased-donor kidney transplant    Performed at [facility], April 2010.  Initial ESRD due to HTN nephropathy  . Eczema   . ESRD (end stage renal disease) ([place])    s/p transplant creatinine baseline 1.1  M/W/F dialysis  . FUO (fever of unknown origin) 05/17/2015  . GERD (gastroesophageal reflux disease)   . Headache(784.0)   . History of hyperparathyroidism   . Hypertension   . Peritonitis ([place]) 10/2019  . Shortness of breath   . Wears glasses    Review of Systems:  As per HPI  Physical Exam:  Vitals:   01/03/21 0921  BP: 110/73  Pulse: 89  Temp: 99.1 F (37.3 C)  TempSrc: Oral  SpO2: 100%  Weight: 165 lb 11.2 oz (75.2 kg)  Height: 5\' 7"  (1.702 m)   Physical Exam Vitals and nursing note reviewed.  Cardiovascular:     Heart sounds: Normal heart sounds.  Pulmonary:     Breath sounds: No rales.  Skin:    Comments: LUE fistula with thrill auscultated   Neurological:     Mental Status: She is alert.     Assessment & Plan:   See Encounters Tab for problem based charting.  Patient discussed with Dr. Philipp Ovens

## 2021-01-03 NOTE — Assessment & Plan Note (Signed)
#  Hypertension: She was started on clonidine during her recent hospitalization for elevated blood pressures however since her discharge, she had episodes where she was symptomatically hypotensive.  Her clonidine was decreased from 0.2 mg 3 times daily to 0.1 mg twice daily.  She has been doing well with the decreased doses  Plan: - Slowly taper off clonidine.  Take clonidine 0.1 mg daily - Continue amlodipine 10 mg daily - Continue Coreg 12.5 mg twice daily.  If BP continues to be elevated, this can be increased - Telehealth visit in 2 weeks as patient has blood pressure machine at home

## 2021-01-06 NOTE — Progress Notes (Signed)
Internal Medicine Clinic Attending  Case discussed with Dr. Agyei  At the time of the visit.  We reviewed the resident's history and exam and pertinent patient test results.  I agree with the assessment, diagnosis, and plan of care documented in the resident's note.  

## 2021-02-05 ENCOUNTER — Encounter: Payer: Self-pay | Admitting: *Deleted

## 2021-03-04 DIAGNOSIS — E1129 Type 2 diabetes mellitus with other diabetic kidney complication: Secondary | ICD-10-CM | POA: Diagnosis not present

## 2021-03-04 DIAGNOSIS — D509 Iron deficiency anemia, unspecified: Secondary | ICD-10-CM | POA: Diagnosis not present

## 2021-03-04 DIAGNOSIS — D689 Coagulation defect, unspecified: Secondary | ICD-10-CM | POA: Diagnosis not present

## 2021-03-04 DIAGNOSIS — Z992 Dependence on renal dialysis: Secondary | ICD-10-CM | POA: Diagnosis not present

## 2021-03-04 DIAGNOSIS — D631 Anemia in chronic kidney disease: Secondary | ICD-10-CM | POA: Diagnosis not present

## 2021-03-04 DIAGNOSIS — N186 End stage renal disease: Secondary | ICD-10-CM | POA: Diagnosis not present

## 2021-03-04 DIAGNOSIS — N2581 Secondary hyperparathyroidism of renal origin: Secondary | ICD-10-CM | POA: Diagnosis not present

## 2021-03-06 DIAGNOSIS — D689 Coagulation defect, unspecified: Secondary | ICD-10-CM | POA: Diagnosis not present

## 2021-03-06 DIAGNOSIS — D509 Iron deficiency anemia, unspecified: Secondary | ICD-10-CM | POA: Diagnosis not present

## 2021-03-06 DIAGNOSIS — E1129 Type 2 diabetes mellitus with other diabetic kidney complication: Secondary | ICD-10-CM | POA: Diagnosis not present

## 2021-03-06 DIAGNOSIS — D631 Anemia in chronic kidney disease: Secondary | ICD-10-CM | POA: Diagnosis not present

## 2021-03-06 DIAGNOSIS — N2581 Secondary hyperparathyroidism of renal origin: Secondary | ICD-10-CM | POA: Diagnosis not present

## 2021-03-06 DIAGNOSIS — Z992 Dependence on renal dialysis: Secondary | ICD-10-CM | POA: Diagnosis not present

## 2021-03-06 DIAGNOSIS — N186 End stage renal disease: Secondary | ICD-10-CM | POA: Diagnosis not present

## 2021-03-08 DIAGNOSIS — D689 Coagulation defect, unspecified: Secondary | ICD-10-CM | POA: Diagnosis not present

## 2021-03-08 DIAGNOSIS — N186 End stage renal disease: Secondary | ICD-10-CM | POA: Diagnosis not present

## 2021-03-08 DIAGNOSIS — D631 Anemia in chronic kidney disease: Secondary | ICD-10-CM | POA: Diagnosis not present

## 2021-03-08 DIAGNOSIS — D509 Iron deficiency anemia, unspecified: Secondary | ICD-10-CM | POA: Diagnosis not present

## 2021-03-08 DIAGNOSIS — E1129 Type 2 diabetes mellitus with other diabetic kidney complication: Secondary | ICD-10-CM | POA: Diagnosis not present

## 2021-03-08 DIAGNOSIS — N2581 Secondary hyperparathyroidism of renal origin: Secondary | ICD-10-CM | POA: Diagnosis not present

## 2021-03-08 DIAGNOSIS — Z992 Dependence on renal dialysis: Secondary | ICD-10-CM | POA: Diagnosis not present

## 2021-03-11 DIAGNOSIS — D509 Iron deficiency anemia, unspecified: Secondary | ICD-10-CM | POA: Diagnosis not present

## 2021-03-11 DIAGNOSIS — N186 End stage renal disease: Secondary | ICD-10-CM | POA: Diagnosis not present

## 2021-03-11 DIAGNOSIS — D689 Coagulation defect, unspecified: Secondary | ICD-10-CM | POA: Diagnosis not present

## 2021-03-11 DIAGNOSIS — N2581 Secondary hyperparathyroidism of renal origin: Secondary | ICD-10-CM | POA: Diagnosis not present

## 2021-03-11 DIAGNOSIS — Z992 Dependence on renal dialysis: Secondary | ICD-10-CM | POA: Diagnosis not present

## 2021-03-11 DIAGNOSIS — D631 Anemia in chronic kidney disease: Secondary | ICD-10-CM | POA: Diagnosis not present

## 2021-03-11 DIAGNOSIS — E1129 Type 2 diabetes mellitus with other diabetic kidney complication: Secondary | ICD-10-CM | POA: Diagnosis not present

## 2021-03-12 ENCOUNTER — Other Ambulatory Visit: Payer: Self-pay | Admitting: Student

## 2021-03-13 ENCOUNTER — Other Ambulatory Visit (INDEPENDENT_AMBULATORY_CARE_PROVIDER_SITE_OTHER): Payer: Self-pay | Admitting: Nurse Practitioner

## 2021-03-13 DIAGNOSIS — E1129 Type 2 diabetes mellitus with other diabetic kidney complication: Secondary | ICD-10-CM | POA: Diagnosis not present

## 2021-03-13 DIAGNOSIS — N186 End stage renal disease: Secondary | ICD-10-CM | POA: Diagnosis not present

## 2021-03-13 DIAGNOSIS — Z992 Dependence on renal dialysis: Secondary | ICD-10-CM

## 2021-03-13 DIAGNOSIS — D689 Coagulation defect, unspecified: Secondary | ICD-10-CM | POA: Diagnosis not present

## 2021-03-13 DIAGNOSIS — D509 Iron deficiency anemia, unspecified: Secondary | ICD-10-CM | POA: Diagnosis not present

## 2021-03-13 DIAGNOSIS — N2581 Secondary hyperparathyroidism of renal origin: Secondary | ICD-10-CM | POA: Diagnosis not present

## 2021-03-13 DIAGNOSIS — D631 Anemia in chronic kidney disease: Secondary | ICD-10-CM | POA: Diagnosis not present

## 2021-03-13 MED ORDER — AMLODIPINE BESYLATE 10 MG PO TABS: 10.0000 mg | ORAL_TABLET | Freq: Every day | ORAL | 0 refills | Status: DC

## 2021-03-14 ENCOUNTER — Other Ambulatory Visit: Payer: Self-pay

## 2021-03-14 ENCOUNTER — Ambulatory Visit (INDEPENDENT_AMBULATORY_CARE_PROVIDER_SITE_OTHER): Payer: Medicare Other

## 2021-03-14 ENCOUNTER — Ambulatory Visit (INDEPENDENT_AMBULATORY_CARE_PROVIDER_SITE_OTHER): Payer: Medicare Other | Admitting: Nurse Practitioner

## 2021-03-14 DIAGNOSIS — Z992 Dependence on renal dialysis: Secondary | ICD-10-CM

## 2021-03-14 DIAGNOSIS — N186 End stage renal disease: Secondary | ICD-10-CM | POA: Diagnosis not present

## 2021-03-15 DIAGNOSIS — E1129 Type 2 diabetes mellitus with other diabetic kidney complication: Secondary | ICD-10-CM | POA: Diagnosis not present

## 2021-03-15 DIAGNOSIS — N2581 Secondary hyperparathyroidism of renal origin: Secondary | ICD-10-CM | POA: Diagnosis not present

## 2021-03-15 DIAGNOSIS — N186 End stage renal disease: Secondary | ICD-10-CM | POA: Diagnosis not present

## 2021-03-15 DIAGNOSIS — D631 Anemia in chronic kidney disease: Secondary | ICD-10-CM | POA: Diagnosis not present

## 2021-03-15 DIAGNOSIS — Z992 Dependence on renal dialysis: Secondary | ICD-10-CM | POA: Diagnosis not present

## 2021-03-15 DIAGNOSIS — D689 Coagulation defect, unspecified: Secondary | ICD-10-CM | POA: Diagnosis not present

## 2021-03-15 DIAGNOSIS — D509 Iron deficiency anemia, unspecified: Secondary | ICD-10-CM | POA: Diagnosis not present

## 2021-03-18 DIAGNOSIS — N2581 Secondary hyperparathyroidism of renal origin: Secondary | ICD-10-CM | POA: Diagnosis not present

## 2021-03-18 DIAGNOSIS — N186 End stage renal disease: Secondary | ICD-10-CM | POA: Diagnosis not present

## 2021-03-18 DIAGNOSIS — D689 Coagulation defect, unspecified: Secondary | ICD-10-CM | POA: Diagnosis not present

## 2021-03-18 DIAGNOSIS — D631 Anemia in chronic kidney disease: Secondary | ICD-10-CM | POA: Diagnosis not present

## 2021-03-18 DIAGNOSIS — D509 Iron deficiency anemia, unspecified: Secondary | ICD-10-CM | POA: Diagnosis not present

## 2021-03-18 DIAGNOSIS — E1129 Type 2 diabetes mellitus with other diabetic kidney complication: Secondary | ICD-10-CM | POA: Diagnosis not present

## 2021-03-18 DIAGNOSIS — Z992 Dependence on renal dialysis: Secondary | ICD-10-CM | POA: Diagnosis not present

## 2021-03-20 ENCOUNTER — Encounter (INDEPENDENT_AMBULATORY_CARE_PROVIDER_SITE_OTHER): Payer: Self-pay | Admitting: *Deleted

## 2021-03-20 DIAGNOSIS — N2581 Secondary hyperparathyroidism of renal origin: Secondary | ICD-10-CM | POA: Diagnosis not present

## 2021-03-20 DIAGNOSIS — D509 Iron deficiency anemia, unspecified: Secondary | ICD-10-CM | POA: Diagnosis not present

## 2021-03-20 DIAGNOSIS — D631 Anemia in chronic kidney disease: Secondary | ICD-10-CM | POA: Diagnosis not present

## 2021-03-20 DIAGNOSIS — N186 End stage renal disease: Secondary | ICD-10-CM | POA: Diagnosis not present

## 2021-03-20 DIAGNOSIS — D689 Coagulation defect, unspecified: Secondary | ICD-10-CM | POA: Diagnosis not present

## 2021-03-20 DIAGNOSIS — E1129 Type 2 diabetes mellitus with other diabetic kidney complication: Secondary | ICD-10-CM | POA: Diagnosis not present

## 2021-03-20 DIAGNOSIS — Z992 Dependence on renal dialysis: Secondary | ICD-10-CM | POA: Diagnosis not present

## 2021-03-22 DIAGNOSIS — N2581 Secondary hyperparathyroidism of renal origin: Secondary | ICD-10-CM | POA: Diagnosis not present

## 2021-03-22 DIAGNOSIS — Z992 Dependence on renal dialysis: Secondary | ICD-10-CM | POA: Diagnosis not present

## 2021-03-22 DIAGNOSIS — N186 End stage renal disease: Secondary | ICD-10-CM | POA: Diagnosis not present

## 2021-03-22 DIAGNOSIS — D509 Iron deficiency anemia, unspecified: Secondary | ICD-10-CM | POA: Diagnosis not present

## 2021-03-22 DIAGNOSIS — E1129 Type 2 diabetes mellitus with other diabetic kidney complication: Secondary | ICD-10-CM | POA: Diagnosis not present

## 2021-03-22 DIAGNOSIS — D631 Anemia in chronic kidney disease: Secondary | ICD-10-CM | POA: Diagnosis not present

## 2021-03-22 DIAGNOSIS — D689 Coagulation defect, unspecified: Secondary | ICD-10-CM | POA: Diagnosis not present

## 2021-03-25 DIAGNOSIS — E1129 Type 2 diabetes mellitus with other diabetic kidney complication: Secondary | ICD-10-CM | POA: Diagnosis not present

## 2021-03-25 DIAGNOSIS — N186 End stage renal disease: Secondary | ICD-10-CM | POA: Diagnosis not present

## 2021-03-25 DIAGNOSIS — D509 Iron deficiency anemia, unspecified: Secondary | ICD-10-CM | POA: Diagnosis not present

## 2021-03-25 DIAGNOSIS — D689 Coagulation defect, unspecified: Secondary | ICD-10-CM | POA: Diagnosis not present

## 2021-03-25 DIAGNOSIS — Z992 Dependence on renal dialysis: Secondary | ICD-10-CM | POA: Diagnosis not present

## 2021-03-25 DIAGNOSIS — N2581 Secondary hyperparathyroidism of renal origin: Secondary | ICD-10-CM | POA: Diagnosis not present

## 2021-03-25 DIAGNOSIS — D631 Anemia in chronic kidney disease: Secondary | ICD-10-CM | POA: Diagnosis not present

## 2021-03-28 ENCOUNTER — Encounter (INDEPENDENT_AMBULATORY_CARE_PROVIDER_SITE_OTHER): Payer: Self-pay

## 2021-03-28 ENCOUNTER — Telehealth (INDEPENDENT_AMBULATORY_CARE_PROVIDER_SITE_OTHER): Payer: Self-pay | Admitting: Vascular Surgery

## 2021-03-29 ENCOUNTER — Ambulatory Visit
Admission: RE | Admit: 2021-03-29 | Discharge: 2021-03-29 | Disposition: A | Discharge status: Home / Self Care | Payer: Medicare Other | Attending: Vascular Surgery | Admitting: Vascular Surgery

## 2021-03-29 DIAGNOSIS — N186 End stage renal disease: Secondary | ICD-10-CM

## 2021-03-29 NOTE — Progress Notes (Signed)
This patient was told to arrive at 2:00 for a fistulagram with Dr. Lucky Cowboy.  The patient arrived at 3:00pm for her procedure.  I spoke with the patient to inform her that she was 1hour late and that at this point we have an emergency procedure that would go in front of her.  I informed her that her procedure would be delayed by approx 1hour and that a staff member would be out shortly to bring her back for pre op.  I went out to the waiting area to take the patient to lab for a potassium draw and she was gone.  I looked around immediate areas and could not find this patient.  Dr. Lucky Cowboy notified of the above

## 2021-04-01 ENCOUNTER — Other Ambulatory Visit (INDEPENDENT_AMBULATORY_CARE_PROVIDER_SITE_OTHER): Payer: Self-pay | Admitting: Nurse Practitioner

## 2021-04-01 ENCOUNTER — Encounter: Payer: Self-pay | Admitting: Vascular Surgery

## 2021-04-01 ENCOUNTER — Ambulatory Visit
Admission: RE | Disposition: A | Discharge status: Home / Self Care | Payer: Self-pay | Source: Home / Self Care | Attending: Vascular Surgery

## 2021-04-01 ENCOUNTER — Other Ambulatory Visit: Payer: Self-pay

## 2021-04-01 ENCOUNTER — Ambulatory Visit
Admission: RE | Admit: 2021-04-01 | Discharge: 2021-04-01 | Disposition: A | Discharge status: Home / Self Care | Payer: Medicare Other | Attending: Vascular Surgery | Admitting: Vascular Surgery

## 2021-04-01 DIAGNOSIS — I2510 Atherosclerotic heart disease of native coronary artery without angina pectoris: Secondary | ICD-10-CM | POA: Diagnosis not present

## 2021-04-01 DIAGNOSIS — F17210 Nicotine dependence, cigarettes, uncomplicated: Secondary | ICD-10-CM | POA: Diagnosis not present

## 2021-04-01 DIAGNOSIS — Y841 Kidney dialysis as the cause of abnormal reaction of the patient, or of later complication, without mention of misadventure at the time of the procedure: Secondary | ICD-10-CM | POA: Insufficient documentation

## 2021-04-01 DIAGNOSIS — Z8249 Family history of ischemic heart disease and other diseases of the circulatory system: Secondary | ICD-10-CM | POA: Diagnosis not present

## 2021-04-01 DIAGNOSIS — I120 Hypertensive chronic kidney disease with stage 5 chronic kidney disease or end stage renal disease: Secondary | ICD-10-CM | POA: Diagnosis not present

## 2021-04-01 DIAGNOSIS — E8770 Fluid overload, unspecified: Secondary | ICD-10-CM | POA: Diagnosis not present

## 2021-04-01 DIAGNOSIS — Z833 Family history of diabetes mellitus: Secondary | ICD-10-CM | POA: Insufficient documentation

## 2021-04-01 DIAGNOSIS — N186 End stage renal disease: Secondary | ICD-10-CM | POA: Diagnosis not present

## 2021-04-01 DIAGNOSIS — Z992 Dependence on renal dialysis: Secondary | ICD-10-CM | POA: Diagnosis not present

## 2021-04-01 DIAGNOSIS — E1122 Type 2 diabetes mellitus with diabetic chronic kidney disease: Secondary | ICD-10-CM | POA: Diagnosis not present

## 2021-04-01 DIAGNOSIS — T829XXA Unspecified complication of cardiac and vascular prosthetic device, implant and graft, initial encounter: Secondary | ICD-10-CM | POA: Diagnosis not present

## 2021-04-01 DIAGNOSIS — Z880 Allergy status to penicillin: Secondary | ICD-10-CM | POA: Diagnosis not present

## 2021-04-01 DIAGNOSIS — T82858A Stenosis of vascular prosthetic devices, implants and grafts, initial encounter: Secondary | ICD-10-CM | POA: Diagnosis not present

## 2021-04-01 DIAGNOSIS — I10 Essential (primary) hypertension: Secondary | ICD-10-CM | POA: Diagnosis not present

## 2021-04-01 HISTORY — PX: A/V FISTULAGRAM: CATH118298

## 2021-04-01 LAB — POTASSIUM (ARMC VASCULAR LAB ONLY): Potassium (ARMC vascular lab): 5.4 — ABNORMAL HIGH (ref 3.5–5.1)

## 2021-04-01 SURGERY — A/V FISTULAGRAM
Anesthesia: Moderate Sedation | Laterality: Left

## 2021-04-01 MED ORDER — FAMOTIDINE 20 MG PO TABS: 40.0000 mg | ORAL_TABLET | Freq: Once | ORAL | Status: DC | PRN

## 2021-04-01 MED ORDER — SODIUM CHLORIDE 0.9 % IV SOLN: 100.0000 mL | INTRAVENOUS | Status: DC | PRN

## 2021-04-01 MED ORDER — SODIUM CHLORIDE 0.9 % IV SOLN: INTRAVENOUS | Status: DC

## 2021-04-01 MED ORDER — HEPARIN SODIUM (PORCINE) 1000 UNIT/ML DIALYSIS: 1000.0000 [IU] | INTRAMUSCULAR | Status: DC | PRN

## 2021-04-01 MED ORDER — IPRATROPIUM-ALBUTEROL 0.5-2.5 (3) MG/3ML IN SOLN: 3.0000 mL | Freq: Once | RESPIRATORY_TRACT | Status: AC

## 2021-04-01 MED ORDER — CHLORHEXIDINE GLUCONATE CLOTH 2 % EX PADS: 6.0000 | MEDICATED_PAD | Freq: Every day | CUTANEOUS | Status: DC

## 2021-04-01 MED ORDER — METHYLPREDNISOLONE SODIUM SUCC 125 MG IJ SOLR: 125.0000 mg | Freq: Once | INTRAMUSCULAR | Status: DC | PRN

## 2021-04-01 MED ORDER — CLINDAMYCIN PHOSPHATE 300 MG/50ML IV SOLN
INTRAVENOUS | Status: AC
  Administered 2021-04-01: 300 mg via INTRAVENOUS
  Filled 2021-04-01: qty 50

## 2021-04-01 MED ORDER — LIDOCAINE-PRILOCAINE 2.5-2.5 % EX CREA: 1.0000 "application " | TOPICAL_CREAM | CUTANEOUS | Status: DC | PRN

## 2021-04-01 MED ORDER — FENTANYL CITRATE PF 50 MCG/ML IJ SOSY
PREFILLED_SYRINGE | INTRAMUSCULAR | Status: AC
  Filled 2021-04-01: qty 1

## 2021-04-01 MED ORDER — ACETAMINOPHEN 325 MG PO TABS: 650.0000 mg | ORAL_TABLET | Freq: Once | ORAL | Status: AC

## 2021-04-01 MED ORDER — ONDANSETRON HCL 4 MG/2ML IJ SOLN: 4.0000 mg | Freq: Four times a day (QID) | INTRAMUSCULAR | Status: DC | PRN

## 2021-04-01 MED ORDER — MIDAZOLAM HCL 2 MG/2ML IJ SOLN
INTRAMUSCULAR | Status: AC
  Filled 2021-04-01: qty 2

## 2021-04-01 MED ORDER — MIDAZOLAM HCL 2 MG/2ML IJ SOLN
INTRAMUSCULAR | Status: DC | PRN
  Administered 2021-04-01: 2 mg via INTRAVENOUS

## 2021-04-01 MED ORDER — HEPARIN SODIUM (PORCINE) 1000 UNIT/ML IJ SOLN
INTRAMUSCULAR | Status: AC
  Filled 2021-04-01: qty 1

## 2021-04-01 MED ORDER — LIDOCAINE HCL (PF) 1 % IJ SOLN: 5.0000 mL | INTRAMUSCULAR | Status: DC | PRN

## 2021-04-01 MED ORDER — IODIXANOL 320 MG/ML IV SOLN
INTRAVENOUS | Status: DC | PRN
  Administered 2021-04-01: 20 mL

## 2021-04-01 MED ORDER — HYDROMORPHONE HCL 1 MG/ML IJ SOLN: 1.0000 mg | Freq: Once | INTRAMUSCULAR | Status: DC | PRN

## 2021-04-01 MED ORDER — DIPHENHYDRAMINE HCL 50 MG/ML IJ SOLN: 50.0000 mg | Freq: Once | INTRAMUSCULAR | Status: DC | PRN

## 2021-04-01 MED ORDER — MIDAZOLAM HCL 2 MG/ML PO SYRP: 8.0000 mg | ORAL_SOLUTION | Freq: Once | ORAL | Status: DC | PRN

## 2021-04-01 MED ORDER — HEPARIN SODIUM (PORCINE) 1000 UNIT/ML IJ SOLN
INTRAMUSCULAR | Status: DC | PRN
  Administered 2021-04-01: 3000 [IU] via INTRAVENOUS

## 2021-04-01 MED ORDER — FENTANYL CITRATE (PF) 100 MCG/2ML IJ SOLN
INTRAMUSCULAR | Status: DC | PRN
  Administered 2021-04-01: 50 ug via INTRAVENOUS

## 2021-04-01 MED ORDER — IPRATROPIUM-ALBUTEROL 0.5-2.5 (3) MG/3ML IN SOLN
RESPIRATORY_TRACT | Status: AC
  Administered 2021-04-01: 3 mL via RESPIRATORY_TRACT
  Filled 2021-04-01: qty 3

## 2021-04-01 MED ORDER — CLINDAMYCIN PHOSPHATE 300 MG/50ML IV SOLN: 300.0000 mg | Freq: Once | INTRAVENOUS | Status: AC

## 2021-04-01 MED ORDER — ACETAMINOPHEN 325 MG PO TABS
ORAL_TABLET | ORAL | Status: AC
  Administered 2021-04-01: 650 mg via ORAL
  Filled 2021-04-01: qty 2

## 2021-04-01 MED ORDER — PENTAFLUOROPROP-TETRAFLUOROETH EX AERO
1.0000 "application " | INHALATION_SPRAY | CUTANEOUS | Status: DC | PRN
  Filled 2021-04-01: qty 30

## 2021-04-01 SURGICAL SUPPLY — 15 items
BALLN LUTONIX AV 7X60X75 (BALLOONS) ×2
BALLOON LUTONIX AV 7X60X75 (BALLOONS) ×1 IMPLANT
DRAPE BRACHIAL (DRAPES) ×2 IMPLANT
KIT ENCORE 26 ADVANTAGE (KITS) ×2 IMPLANT
KIT MICROPUNCTURE NIT STIFF (SHEATH) ×2 IMPLANT
KIT SUTURE REMOVAL HAMOT (SET/KITS/TRAYS/PACK) ×2 IMPLANT
NEEDLE ENTRY 21GA 7CM ECHOTIP (NEEDLE) ×2 IMPLANT
PACK ANGIOGRAPHY (CUSTOM PROCEDURE TRAY) ×2 IMPLANT
SHEATH BRITE TIP 6FRX11 (SHEATH) IMPLANT
SHEATH BRITE TIP 6FRX5.5 (SHEATH) ×2 IMPLANT
SUT MNCRL 4-0 (SUTURE) ×2
SUT MNCRL 4-0 27XMFL (SUTURE) ×1
SUTURE MNCRL 4-0 27XMF (SUTURE) ×1 IMPLANT
TOWEL OR 17X26 4PK STRL BLUE (TOWEL DISPOSABLE) ×2 IMPLANT
WIRE MAGIC TOR.035 180C (WIRE) ×2 IMPLANT

## 2021-04-01 NOTE — Consult Note (Signed)
[facility]  CONSULT NOTE    Date: 04/01/2021                  Patient Name:  Ruth Gutierrez  MRN: [id number]  DOB: 07/20/1966  Age / Sex: 55 y.o., female         PCP: Gaylan Gerold, DO                 Service Requesting Consult: Vascular Surgery                 Reason for Consult: ESRD on dialysis            History of Present Illness: Ms. Ruth Gutierrez is a 55 y.o.  female with Anemia, hypertension, GERD, and ESRD on dialysis, who was seen at [facility] on 04/01/2021 for left arm fistulagram.   Patient currently see [facility] in [place] for ESRD dialysis needs. She currently received treatment at [facility] on MWF schedule. She has not received dialysis since last Monday due to malfunctioning HD access. We were notified to provide dialysis once procedure was complete.   Medications: Outpatient medications: Medications Prior to Admission  Medication Sig Dispense Refill Last Dose   carvedilol (COREG) 12.5 MG tablet Take 1 tablet (12.5 mg total) by mouth 2 (two) times daily with a meal. 180 tablet 3 Past Week   cholecalciferol (VITAMIN D3) 25 MCG (1000 UNIT) tablet Take 1,000 Units by mouth daily.   Past Week   omeprazole (PRILOSEC OTC) 20 MG tablet Take 20 mg by mouth daily as needed for heartburn.   Past Month   amLODipine (NORVASC) 10 MG tablet Take 1 tablet (10 mg total) by mouth daily. 30 tablet 0    aspirin 81 MG EC tablet Take 81 mg by mouth as needed for pain.      cloNIDine (CATAPRES) 0.1 MG tablet Take 1 tablet (0.1 mg total) by mouth 2 (two) times daily. 60 tablet 2    dexamethasone (DECADRON) 1 MG tablet Take 1 tablet (1 mg total) by mouth every 12 (twelve) hours. Take 1 tablet twice daily then reduce to 1 tablet daily on 11/11/2020 for 3 more days. (Patient not taking: Reported on 04/01/2021) 5 tablet 0 Completed Course   ibuprofen (ADVIL) 600 MG tablet Take 1 tablet (600 mg total) by mouth every 8 (eight) hours as needed  for moderate pain. 30 tablet 0    levETIRAcetam (KEPPRA) 250 MG tablet Take 1 tablet (250 mg total) by mouth 2 (two) times daily for 9 days. 18 tablet 0    lidocaine-prilocaine (EMLA) cream Apply 1 application topically every Monday, Wednesday, and Friday with hemodialysis. (Patient not taking: Reported on 04/01/2021)   Completed Course   polyethylene glycol (MIRALAX / GLYCOLAX) 17 g packet Take 17 g by mouth daily. 30 each 0     Current medications: Current Facility-Administered Medications  Medication Dose Route Frequency Provider Last Rate Last Admin   0.9 %  sodium chloride infusion  100 mL Intravenous PRN , , NP       0.9 %  sodium chloride infusion  100 mL Intravenous PRN , , NP       Chlorhexidine Gluconate Cloth 2 % PADS 6 each  6 each Topical Q0600 , , NP       fentaNYL (SUBLIMAZE) 50 MCG/ML injection            heparin injection 1,000 Units  1,000 Units Dialysis PRN Colon Flattery, NP  heparin sodium (porcine) 1000 UNIT/ML injection            HYDROmorphone (DILAUDID) injection 1 mg  1 mg Intravenous Once PRN Eulogio Ditch E, NP       lidocaine (PF) (XYLOCAINE) 1 % injection 5 mL  5 mL Intradermal PRN , , NP       lidocaine-prilocaine (EMLA) cream 1 application  1 application Topical PRN [place] Flattery, NP       midazolam (VERSED) 2 MG/2ML injection            ondansetron (ZOFRAN) injection 4 mg  4 mg Intravenous Q6H PRN Kris Hartmann, NP       pentafluoroprop-tetrafluoroeth (GEBAUERS) aerosol 1 application  1 application Topical PRN [place] Flattery, NP          Allergies: Allergies  Allergen Reactions   Penicillin G Itching and Rash   Penicillins Itching and Rash    Did it involve swelling of the face/tongue/throat, SOB, or low BP?Y Did it involve sudden or severe rash/hives, skin peeling, or any reaction on the inside of your mouth or nose? Y Did you need to seek medical attention at a hospital or  doctor's office? Y When did it last happen?  2016     If all above answers are "NO", may proceed with cephalosporin use.      Past Medical History: Past Medical History:  Diagnosis Date   Anemia of chronic disease    Arthritis    Deceased-donor kidney transplant    Performed at [facility], April 2010.  Initial ESRD due to HTN nephropathy   Eczema    ESRD (end stage renal disease) (HCC)    s/p transplant creatinine baseline 1.1  M/W/F dialysis   FUO (fever of unknown origin) 05/17/2015   GERD (gastroesophageal reflux disease)    Headache(784.0)    History of hyperparathyroidism    Hypertension    Peritonitis ([place]) 10/2019   Shortness of breath    Wears glasses      Past Surgical History: Past Surgical History:  Procedure Laterality Date   A/V FISTULAGRAM Left 02/12/2017   Procedure: A/V Fistulagram;  Surgeon: Algernon Huxley, MD;  Location: [place] [facility];  Service: Cardiovascular;  Laterality: Left;   A/V FISTULAGRAM Left 12/30/2017   Procedure: A/V FISTULAGRAM;  Surgeon: Algernon Huxley, MD;  Location: [place] [facility];  Service: Cardiovascular;  Laterality: Left;   A/V FISTULAGRAM Left 04/01/2021   Procedure: A/V FISTULAGRAM;  Surgeon: Algernon Huxley, MD;  Location: [place] [facility];  Service: Cardiovascular;  Laterality: Left;   A/V SHUNT INTERVENTION N/A 02/12/2017   Procedure: A/V Shunt Intervention;  Surgeon: Algernon Huxley, MD;  Location: [place] [facility];  Service: Cardiovascular;  Laterality: N/A;   AV FISTULA PLACEMENT     BASCILIC VEIN TRANSPOSITION Left 10/30/2014   Procedure: LEFT [place];  Surgeon: Rosetta Posner, MD;  Location: [place];  Service: Vascular;  Laterality: Left;   [place] Left 01/03/2015   Procedure: LEFT ARM 2ND STAGE [place];  Surgeon: Rosetta Posner, MD;  Location: [place];  Service: Vascular;  Laterality: Left;   CAPD REMOVAL N/A 10/31/2019   Procedure: PERITONEAL DIALYSIS  (CAPD) INFECTED  CATHETER REMOVAL;  Surgeon: Coralie Keens, MD;  Location: [place];  Service: General;  Laterality: N/A;   CRANIOTOMY N/A 11/01/2020   Procedure: CRANIOTOMY FOR TUMOR EXCISION;  Surgeon: [name], MD;  Location: [place];  Service:  Neurosurgery;  Laterality: N/A;   FRACTURE SURGERY     left foot,baby toe nad next toe missing   INSERTION OF DIALYSIS CATHETER Right 10/30/2014   Procedure: INSERTION OF DIALYSIS CATHETER;  Surgeon: Rosetta Posner, MD;  Location: [place];  Service: Vascular;  Laterality: Right;   KIDNEY TRANSPLANT  11/2008   [facility])   PLACEMENT OF LUMBAR DRAIN N/A 11/01/2020   Procedure: PLACEMENT OF LUMBAR DRAIN;  Surgeon: [name], MD;  Location: [place];  Service: Neurosurgery;  Laterality: N/A;   WISDOM TOOTH EXTRACTION       Family History: Family History  Problem Relation Age of Onset   Hypertension Mother    Hypertension Father    Diabetes Brother    Deep vein thrombosis Brother    Hypertension Sister    Hyperlipidemia Sister    Kidney disease Brother        on HD   Hypertension Sister    Colon cancer Neg Hx    Esophageal cancer Neg Hx    Rectal cancer Neg Hx      Social History: Social History   Socioeconomic History   Marital status: Single    Spouse name: Not on file   Number of children: 1   Years of education: Not on file   Highest education level: Not on file  Occupational History   Occupation: unemployed  Tobacco Use   Smoking status: Every Day    Packs/day: 0.50    Years: 27.00    Pack years: 13.50    Types: Cigarettes   Smokeless tobacco: Never   Tobacco comments:    10 cigarettes a day  Vaping Use   Vaping Use: Never used  Substance and Sexual Activity   Alcohol use: No    Alcohol/week: 0.0 standard drinks    Comment: occasional drinker noted in the past   Drug use: No   Sexual activity: Yes    Partners: Male    Birth control/protection: None  Other Topics Concern   Not on file  Social History Narrative    Single, 1 daughter   Lives with daughter (born 23) and grandkids   sister helps her with medications etc.   cigarette smoker, rare EtOH, no drugs   Social Determinants of Radio broadcast assistant Strain: Not on file  Food Insecurity: Not on file  Transportation Needs: Not on file  Physical Activity: Not on file  Stress: Not on file  Social Connections: Not on file  Intimate Partner Violence: Not on file     Review of Systems: Review of Systems  All other systems reviewed and are negative.  Vital Signs: Blood pressure (!) 167/94, pulse (!) 111, temperature 98.6 F (37 C), temperature source Oral, resp. rate 20, height 5\' 7"  (1.702 m), weight 74.4 kg, last menstrual period 06/02/2015, SpO2 96 %.  Weight trends: Filed Weights   04/01/21 0709  Weight: 74.4 kg    Physical Exam: General: NAD, resting in bed  Head: Normocephalic, atraumatic. Moist oral mucosal membranes  Eyes: Anicteric  Neck: Supple  Lungs:  Course to auscultation, O2 2L  Heart: Regular rate and rhythm  Abdomen:  Soft, nontender  Extremities:  no peripheral edema.  Neurologic: Nonfocal, moving all four extremities  Skin: No lesions  Access: Rt AVF     Lab results: Basic Metabolic Panel: No results for input(s): NA, K, CL, CO2, GLUCOSE, BUN, CREATININE, CALCIUM, MG, PHOS in the last 168 hours.  Liver Function Tests: No results  for input(s): AST, ALT, ALKPHOS, BILITOT, PROT, ALBUMIN in the last 168 hours. No results for input(s): LIPASE, AMYLASE in the last 168 hours. No results for input(s): AMMONIA in the last 168 hours.  CBC: No results for input(s): WBC, NEUTROABS, HGB, HCT, MCV, PLT in the last 168 hours.  Cardiac Enzymes: No results for input(s): CKTOTAL, CKMB, CKMBINDEX, TROPONINI in the last 168 hours.  BNP: Invalid input(s): POCBNP  CBG: No results for input(s): GLUCAP in the last 168 hours.  Microbiology: Results for orders placed or performed during the hospital encounter  of 10/29/20  Resp Panel by RT-PCR (Flu A&B, Covid) Nasopharyngeal Swab     Status: None   Collection Time: 10/29/20  9:45 AM   Specimen: Nasopharyngeal Swab; Nasopharyngeal(NP) swabs in vial transport medium  Result Value Ref Range Status   SARS Coronavirus 2 by RT PCR NEGATIVE NEGATIVE Final    Comment: (NOTE) SARS-CoV-2 target nucleic acids are NOT DETECTED.  The SARS-CoV-2 RNA is generally detectable in upper respiratory specimens during the acute phase of infection. The lowest concentration of SARS-CoV-2 viral copies this assay can detect is 138 copies/mL. A negative result does not preclude SARS-Cov-2 infection and should not be used as the sole basis for treatment or other patient management decisions. A negative result may occur with  improper specimen collection/handling, submission of specimen other than nasopharyngeal swab, presence of viral mutation(s) within the areas targeted by this assay, and inadequate number of viral copies(<138 copies/mL). A negative result must be combined with clinical observations, patient history, and epidemiological information. The expected result is Negative.  Fact Sheet for Patients:  EntrepreneurPulse.com.au  Fact Sheet for Healthcare Providers:  IncredibleEmployment.be  This test is no t yet approved or cleared by the [place] FDA and  has been authorized for detection and/or diagnosis of SARS-CoV-2 by FDA under an Emergency Use Authorization (EUA). This EUA will remain  in effect (meaning this test can be used) for the duration of the COVID-19 declaration under Section 564(b)(1) of the Act, 21 U.S.C.section 360bbb-3(b)(1), unless the authorization is terminated  or revoked sooner.       Influenza A by PCR NEGATIVE NEGATIVE Final   Influenza B by PCR NEGATIVE NEGATIVE Final    Comment: (NOTE) The Xpert Xpress SARS-CoV-2/FLU/RSV plus assay is intended as an aid in the diagnosis of influenza  from Nasopharyngeal swab specimens and should not be used as a sole basis for treatment. Nasal washings and aspirates are unacceptable for Xpert Xpress SARS-CoV-2/FLU/RSV testing.  Fact Sheet for Patients: EntrepreneurPulse.com.au  Fact Sheet for Healthcare Providers: IncredibleEmployment.be  This test is not yet approved or cleared by the [place] FDA and has been authorized for detection and/or diagnosis of SARS-CoV-2 by FDA under an Emergency Use Authorization (EUA). This EUA will remain in effect (meaning this test can be used) for the duration of the COVID-19 declaration under Section 564(b)(1) of the Act, 21 U.S.C. section 360bbb-3(b)(1), unless the authorization is terminated or revoked.  Performed at [facility], [place] [address], [place], [place] [address]   Blood culture (routine x 2)     Status: None   Collection Time: 10/29/20 11:07 AM   Specimen: BLOOD  Result Value Ref Range Status   Specimen Description BLOOD SITE NOT SPECIFIED  Final   Special Requests   Final    BOTTLES DRAWN AEROBIC AND ANAEROBIC Blood Culture adequate volume   Culture   Final    NO GROWTH 5 DAYS Performed at [facility], [place] [address], [place], [place] [address]    Report Status 11/03/2020 FINAL  Final  Blood culture (routine x 2)     Status: None   Collection Time: 10/29/20 11:12 AM   Specimen: BLOOD RIGHT ARM  Result Value Ref Range Status   Specimen Description BLOOD RIGHT ARM  Final   Special Requests   Final    BOTTLES DRAWN AEROBIC AND ANAEROBIC Blood Culture results may not be optimal due to an inadequate volume of blood received in culture bottles   Culture   Final    NO GROWTH 5 DAYS Performed at [facility], [place] [address], [place], [place] [address]    Report Status 11/03/2020 FINAL  Final  Respiratory (~20 pathogens) panel by PCR     Status: None   Collection Time: 10/29/20  3:22 PM   Specimen: Nasopharyngeal  Swab; Respiratory  Result Value Ref Range Status   Adenovirus NOT DETECTED NOT DETECTED Final   Coronavirus 229E NOT DETECTED NOT DETECTED Final    Comment: (NOTE) The Coronavirus on the Respiratory Panel, DOES NOT test for the novel  Coronavirus (2019 nCoV)    Coronavirus HKU1 NOT DETECTED NOT DETECTED Final   Coronavirus NL63 NOT DETECTED NOT DETECTED Final   Coronavirus OC43 NOT DETECTED NOT DETECTED Final   Metapneumovirus NOT DETECTED NOT DETECTED Final   Rhinovirus / Enterovirus NOT DETECTED NOT DETECTED Final   Influenza A NOT DETECTED NOT DETECTED Final   Influenza B NOT DETECTED NOT DETECTED Final   Parainfluenza Virus 1 NOT DETECTED NOT DETECTED Final   Parainfluenza Virus 2 NOT DETECTED NOT DETECTED Final   Parainfluenza Virus 3 NOT DETECTED NOT DETECTED Final   Parainfluenza Virus 4 NOT DETECTED NOT DETECTED Final   Respiratory Syncytial Virus NOT DETECTED NOT DETECTED Final   Bordetella pertussis NOT DETECTED NOT DETECTED Final   Bordetella Parapertussis NOT DETECTED NOT DETECTED Final   Chlamydophila pneumoniae NOT DETECTED NOT DETECTED Final   Mycoplasma pneumoniae NOT DETECTED NOT DETECTED Final    Comment: Performed at [facility], [place] [address], [place], [place] [address]  MRSA PCR Screening     Status: Abnormal   Collection Time: 10/30/20  7:59 PM   Specimen: Nasal Mucosa; Nasopharyngeal  Result Value Ref Range Status   MRSA by PCR (A) NEGATIVE Final    INVALID, UNABLE TO DETERMINE THE PRESENCE OF TARGET DUE TO SPECIMEN INTEGRITY. RECOLLECTION REQUESTED.    Comment: RESULT CALLED TO, READ BACK BY AND VERIFIED WITHVeva Holes RN 11/01/20 0024 JDW Performed at [facility], [address] [place], [place] [address]   Surgical pcr screen     Status: None   Collection Time: 11/01/20 12:43 AM   Specimen: Nasal Mucosa; Nasal Swab  Result Value Ref Range Status   MRSA, PCR NEGATIVE NEGATIVE Final   Staphylococcus aureus NEGATIVE NEGATIVE Final     Comment: (NOTE) The Xpert SA Assay (FDA approved for NASAL specimens in patients 18 years of age and older), is one component of a comprehensive surveillance program. It is not intended to diagnose infection nor to guide or monitor treatment. Performed at [facility], [place] [address] [place], [place] [address]     Coagulation Studies: No results for input(s): LABPROT, INR in the last 72 hours.  Urinalysis: No results for input(s): COLORURINE, LABSPEC, PHURINE, GLUCOSEU, HGBUR, BILIRUBINUR, KETONESUR, PROTEINUR, UROBILINOGEN, NITRITE, LEUKOCYTESUR in the last 72 hours.  Invalid input(s): APPERANCEUR  Imaging: PERIPHERAL VASCULAR CATHETERIZATION  Result Date: 04/01/2021 See surgical note for result.    Assessment & Plan: Ms. JENICE LEINER is a 55 y.o.  female with Anemia, hypertension, GERD, and ESRD on dialysis, who was seen at  [facility] on 04/01/2021 for Lt arm fistulagram  [facility]/MWF/Rt AVF  Malfunctioning dialysis access Appreciate vascular performing fistulagram. Minimal stenosis seen and balloon applied. Access found to be patent at that time  2. End stage renal disease on dialysis Last dialysis was on 03/25/21. Scheduled for dialysis today. Tolerated treatment well with UF goal 3L achieved. Patient cleared for discharge and can resume treatments at outpatient clinic on Wednesday.          LOS: 0   8/29/20227:46 PM

## 2021-04-01 NOTE — H&P (Signed)
[facility] Admission History & Physical  MRN : [id number]  Ruth Gutierrez is a 55 y.o. (1965/11/13) female who presents with chief complaint of No chief complaint on file. Marland Kitchen  History of Present Illness: I am asked to evaluate the patient by the dialysis center. The patient was sent here because they were unable to achieve adequate dialysis this morning. Furthermore the Center states there is very poor thrill and bruit. The patient states there there have been increasing problems with the access, such as "pulling clots" during dialysis and prolonged bleeding after decannulation. The patient estimates these problems have been going on for several weeks. The patient is unaware of any other change.   Patient denies pain or tenderness overlying the access.  There is no pain with dialysis.  The patient denies hand pain or finger pain consistent with steal syndrome.    There have been past interventions or declots of this access.  The patient is not chronically hypotensive on dialysis.  Current Facility-Administered Medications  Medication Dose Route Frequency Provider Last Rate Last Admin   clindamycin (CLEOCIN) 300 MG/50ML IVPB            0.9 %  sodium chloride infusion   Intravenous Continuous Kris Hartmann, NP 10 mL/hr at 04/01/21 0740 New Bag at 04/01/21 0740   clindamycin (CLEOCIN) IVPB 300 mg  300 mg Intravenous Once Kris Hartmann, NP       diphenhydrAMINE (BENADRYL) injection 50 mg  50 mg Intravenous Once PRN Kris Hartmann, NP       famotidine (PEPCID) tablet 40 mg  40 mg Oral Once PRN Kris Hartmann, NP       HYDROmorphone (DILAUDID) injection 1 mg  1 mg Intravenous Once PRN Kris Hartmann, NP       methylPREDNISolone sodium succinate (SOLU-MEDROL) 125 mg/2 mL injection 125 mg  125 mg Intravenous Once PRN Kris Hartmann, NP       midazolam (VERSED) 2 MG/ML syrup 8 mg  8 mg Oral Once PRN Kris Hartmann, NP       ondansetron [facility] injection 4 mg  4  mg Intravenous Q6H PRN Kris Hartmann, NP        Past Medical History:  Diagnosis Date   Anemia of chronic disease    Arthritis    Deceased-donor kidney transplant    Performed at [facility], April 2010.  Initial ESRD due to HTN nephropathy   Eczema    ESRD (end stage renal disease) (HCC)    s/p transplant creatinine baseline 1.1  M/W/F dialysis   FUO (fever of unknown origin) 05/17/2015   GERD (gastroesophageal reflux disease)    Headache(784.0)    History of hyperparathyroidism    Hypertension    Peritonitis ([place]) 10/2019   Shortness of breath    Wears glasses     Past Surgical History:  Procedure Laterality Date   A/V FISTULAGRAM Left 02/12/2017   Procedure: A/V Fistulagram;  Surgeon: Algernon Huxley, MD;  Location: [place] [facility];  Service: Cardiovascular;  Laterality: Left;   A/V FISTULAGRAM Left 12/30/2017   Procedure: A/V FISTULAGRAM;  Surgeon: Algernon Huxley, MD;  Location: [place] [facility];  Service: Cardiovascular;  Laterality: Left;   A/V SHUNT INTERVENTION N/A 02/12/2017   Procedure: A/V Shunt Intervention;  Surgeon: Algernon Huxley, MD;  Location: [place] [facility];  Service: Cardiovascular;  Laterality: N/A;   AV FISTULA PLACEMENT     BASCILIC  VEIN TRANSPOSITION Left 10/30/2014   Procedure: LEFT BASCILIC VEIN TRANSPOSITION;  Surgeon: Rosetta Posner, MD;  Location: [place];  Service: Vascular;  Laterality: Left;   [place] Left 01/03/2015   Procedure: LEFT ARM 2ND STAGE [place];  Surgeon: Rosetta Posner, MD;  Location: [place];  Service: Vascular;  Laterality: Left;   CAPD REMOVAL N/A 10/31/2019   Procedure: PERITONEAL DIALYSIS  (CAPD) INFECTED CATHETER REMOVAL;  Surgeon: Coralie Keens, MD;  Location: [place];  Service: General;  Laterality: N/A;   CRANIOTOMY N/A 11/01/2020   Procedure: CRANIOTOMY FOR TUMOR EXCISION;  Surgeon: [name], MD;  Location: [place];  Service: Neurosurgery;  Laterality: N/A;   FRACTURE SURGERY      left foot,baby toe nad next toe missing   INSERTION OF DIALYSIS CATHETER Right 10/30/2014   Procedure: INSERTION OF DIALYSIS CATHETER;  Surgeon: Rosetta Posner, MD;  Location: [place];  Service: Vascular;  Laterality: Right;   KIDNEY TRANSPLANT  11/2008   [facility])   PLACEMENT OF LUMBAR DRAIN N/A 11/01/2020   Procedure: PLACEMENT OF LUMBAR DRAIN;  Surgeon: [name], MD;  Location: [place];  Service: Neurosurgery;  Laterality: N/A;   WISDOM TOOTH EXTRACTION       Social History   Tobacco Use   Smoking status: Every Day    Packs/day: 0.50    Years: 27.00    Pack years: 13.50    Types: Cigarettes   Smokeless tobacco: Never   Tobacco comments:    10 cigarettes a day  Vaping Use   Vaping Use: Never used  Substance Use Topics   Alcohol use: No    Alcohol/week: 0.0 standard drinks    Comment: occasional drinker noted in the past   Drug use: No    Family History  Problem Relation Age of Onset   Hypertension Mother    Hypertension Father    Diabetes Brother    Deep vein thrombosis Brother    Hypertension Sister    Hyperlipidemia Sister    Kidney disease Brother        on HD   Hypertension Sister    Colon cancer Neg Hx    Esophageal cancer Neg Hx    Rectal cancer Neg Hx     No family history of bleeding or clotting disorders, autoimmune disease or porphyria  Allergies  Allergen Reactions   Penicillin G Itching and Rash   Penicillins Itching and Rash    Did it involve swelling of the face/tongue/throat, SOB, or low BP?Y Did it involve sudden or severe rash/hives, skin peeling, or any reaction on the inside of your mouth or nose? Y Did you need to seek medical attention at a hospital or doctor's office? Y When did it last happen?  2016     If all above answers are "NO", may proceed with cephalosporin use.     REVIEW OF SYSTEMS (Negative unless checked)  Constitutional: [] Weight loss  [] Fever  [] Chills Cardiac: [] Chest pain   [] Chest pressure    [] Palpitations   [] Shortness of breath when laying flat   [x] Shortness of breath at rest   [x] Shortness of breath with exertion. Vascular:  [] Pain in legs with walking   [] Pain in legs at rest   [] Pain in legs when laying flat   [] Claudication   [] Pain in feet when walking  [] Pain in feet at rest  [] Pain in feet when laying flat   [] History of DVT   [] Phlebitis   []   Swelling in legs   [] Varicose veins   [] Non-healing ulcers Pulmonary:   [] Uses home oxygen   [] Productive cough   [] Hemoptysis   [] Wheeze  [] COPD   [] Asthma Neurologic:  [] Dizziness  [] Blackouts   [] Seizures   [] History of stroke   [] History of TIA  [] Aphasia   [] Temporary blindness   [] Dysphagia   [] Weakness or numbness in arms   [] Weakness or numbness in legs Musculoskeletal:  [] Arthritis   [] Joint swelling   [] Joint pain   [] Low back pain Hematologic:  [] Easy bruising  [] Easy bleeding   [] Hypercoagulable state   [x] Anemic  [] Hepatitis Gastrointestinal:  [] Blood in stool   [] Vomiting blood  [] Gastroesophageal reflux/heartburn   [] Difficulty swallowing. Genitourinary:  [x] Chronic kidney disease   [] Difficult urination  [] Frequent urination  [] Burning with urination   [] Blood in urine Skin:  [] Rashes   [] Ulcers   [] Wounds Psychological:  [] History of anxiety   []  History of major depression.  Physical Examination  Vitals:   04/01/21 0709  BP: (!) 182/108  Pulse: (!) 118  Resp: (!) 28  Temp: 99.9 F (37.7 C)  TempSrc: Oral  SpO2: 90%  Weight: 74.4 kg  Height: 5\' 7"  (1.702 m)   Body mass index is 25.69 kg/m. Gen: WD/WN, NAD Head: [place]/AT, No temporalis wasting.  Ear/Nose/Throat: Hearing grossly intact, nares w/o erythema or drainage, oropharynx w/o Erythema/Exudate,  Eyes: Conjunctiva clear, sclera non-icteric Neck: Trachea midline.  No JVD.  Pulmonary:  Good air movement, respirations not labored, no use of accessory muscles.  Cardiac: tachycardic Vascular: thrill present Vessel Right Left  Radial Palpable Palpable    Musculoskeletal: M/S 5/5 throughout.  Extremities without ischemic changes.  No deformity or atrophy.  Neurologic: Sensation grossly intact in extremities.  Symmetrical.  Speech is fluent. Motor exam as listed above. Psychiatric: Judgment intact, Mood & affect appropriate for pt's clinical situation. Dermatologic: No rashes or ulcers noted.  No cellulitis or open wounds.    CBC Lab Results  Component Value Date   WBC 12.4 (H) 11/10/2020   HGB 8.3 (L) 11/10/2020   HCT 25.8 (L) 11/10/2020   MCV 96.3 11/10/2020   PLT 159 11/10/2020    BMET    Component Value Date/Time   NA 134 (L) 11/10/2020 0200   NA 142 11/08/2019 0940   K 4.6 11/10/2020 0200   CL 97 (L) 11/10/2020 0200   CO2 28 11/10/2020 0200   GLUCOSE 136 (H) 11/10/2020 0200   BUN 19 11/10/2020 0200   BUN 16 11/08/2019 0940   CREATININE 5.03 (H) 11/10/2020 0200   CREATININE 1.47 (H) 04/14/2013 1149   CALCIUM 7.5 (L) 11/10/2020 0200   CALCIUM 7.2 (L) 01/13/2007 0830   GFRNONAA 10 (L) 11/10/2020 0200   GFRNONAA 43 (L) 04/14/2013 1149   GFRAA 9 (L) 11/08/2019 0940   GFRAA 49 (L) 04/14/2013 1149   CrCl cannot be calculated (Patient's most recent lab result is older than the maximum 21 days allowed.).  COAG Lab Results  Component Value Date   INR 1.4 (H) 10/31/2020   INR 1.35 03/09/2011    Radiology VAS US DUPLEX DIALYSIS ACCESS (AVF,AVG)  Result Date: 03/22/2021 DIALYSIS ACCESS Patient Name:  Ruth Gutierrez  Date of Exam:   03/14/2021 Medical Rec #: [id number]             Accession #:    [id number] Date of Birth: [birth year]             Patient Gender: F Patient Age:   56  years Exam Location:  [facility] Procedure:      VAS US DUPLEX DIALYSIS ACCESS (AVF, AVG) Referring Phys: Eulogio Ditch --------------------------------------------------------------------------------  Access Site: Left Upper Extremity. Access Type: Basilic vein transposition. Performing Technologist: Almira Coaster RVS  Examination  Guidelines: A complete evaluation includes B-mode imaging, spectral Doppler, color Doppler, and power Doppler as needed of all accessible portions of each vessel. Unilateral testing is considered an integral part of a complete examination. Limited examinations for reoccurring indications may be performed as noted.  Findings: +--------------------+----------+-----------------+--------+ AVF                 PSV (cm/s)Flow Vol (mL/min)Comments +--------------------+----------+-----------------+--------+ Native artery inflow   200          1546                +--------------------+----------+-----------------+--------+ AVF Anastomosis        396                              +--------------------+----------+-----------------+--------+  +---------------+----------+-------------+----------+------------------+ OUTFLOW VEIN   PSV (cm/s)Diameter (cm)Depth (cm)     Describe      +---------------+----------+-------------+----------+------------------+ Subclavian vein   160                                              +---------------+----------+-------------+----------+------------------+ Confluence        237                                              +---------------+----------+-------------+----------+------------------+ Prox UA            89                                              +---------------+----------+-------------+----------+------------------+ Mid UA            140                           change in Diameter +---------------+----------+-------------+----------+------------------+ Dist UA           454                                              +---------------+----------+-------------+----------+------------------+  +--------------+-------------+---------+---------+---------+-------------------+               Diameter (cm)  Depth  Branching   PSV       Flow Volume                                  (cm)             (cm/s)       (ml/min)        +--------------+-------------+---------+---------+---------+-------------------+ Lt Rad Art  62                        Dist                                                                      +--------------+-------------+---------+---------+---------+-------------------+  Summary: The Left BrachioBasilic AVF appears to be patent throughout; Flow Volume appears to be Normal. A small pseudo-aneurysm seen in the Left AVF Distal/Mid segment measuring .70cms x .59cms.  *See table(s) above for measurements and observations.  Diagnosing physician: Leotis Pain MD Electronically signed by Leotis Pain MD on 03/22/2021 at 9:26:48 AM.   --------------------------------------------------------------------------------   Final     Assessment/Plan 1.  Complication dialysis device with dysfunction AV access:  Patient's dialysis access is malfunctioning. The patient will undergo angiography and correction of any problems using interventional techniques with the hope of restoring function to the access.  The risks and benefits were described to the patient.  All questions were answered.  The patient agrees to proceed with angiography and intervention. Potassium will be drawn to ensure that it is an appropriate level prior to performing intervention. 2.  End-stage renal disease requiring hemodialysis:  Patient will continue dialysis therapy without further interruption if a successful intervention is not achieved then a tunneled catheter will be placed. Dialysis has already been arranged. 3.  Hypertension:  Patient will continue medical management; nephrology is following no changes in oral medications. 4. Diabetes mellitus:  Glucose will be monitored and oral medications been held this morning once the patient has undergone the patient's procedure po intake will be reinitiated and again Accu-Cheks will be used to assess the blood glucose level and treat as needed. The patient will  be restarted on the patient's usual hypoglycemic regime 5.  Coronary artery disease:  EKG will be monitored. Nitrates will be used if needed. The patient's oral cardiac medications will be continued.    Leotis Pain, MD  04/01/2021 8:19 AM

## 2021-04-01 NOTE — Progress Notes (Signed)
Taking patient to dialysis unit. Will return here for discharge to home.

## 2021-04-01 NOTE — Progress Notes (Signed)
MD has been made aware that pt came in with labored and rapid breathing. VS obtained and potassium is being run. See MAR.

## 2021-04-01 NOTE — Op Note (Signed)
VEIN AND VASCULAR SURGERY    OPERATIVE NOTE   PROCEDURE: 1.   Left brachiocephalic arteriovenous fistula cannulation under ultrasound guidance 2.   Left arm fistulagram including central venogram 3.   Percutaneous transluminal angioplasty of left subclavian vein at the proximal edge of the previously placed stent with 7 mm diameter by 6 cm length Lutonix drug-coated angioplasty balloon  PRE-OPERATIVE DIAGNOSIS: 1. ESRD 2. Poorly functional left brachiocephalic AVF which appears to have already been revised.  POST-OPERATIVE DIAGNOSIS: same as above   SURGEON: Leotis Pain, MD  ANESTHESIA: local with MCS  ESTIMATED BLOOD LOSS: 5 cc  FINDING(S): Aneurysmal access which is patent including the stent that goes into the subclavian vein until the proximal edge of the stent more centrally just into the subclavian vein with about a 55 to 60% stenosis.  The remainder of the central venous circulation was widely patent.  SPECIMEN(S):  None  CONTRAST: 20 cc  FLUORO TIME: 0.8 minutes  MODERATE CONSCIOUS SEDATION TIME: Approximately 27 minutes with 2 mg of Versed and 50 mcg of Fentanyl   INDICATIONS: Ruth Gutierrez is a 55 y.o. female who presents with malfunctioning left arm arteriovenous fistula.  The patient is scheduled for left arm fistulagram.  The patient is aware the risks include but are not limited to: bleeding, infection, thrombosis of the cannulated access, and possible anaphylactic reaction to the contrast.  The patient is aware of the risks of the procedure and elects to proceed forward.  DESCRIPTION: After full informed written consent was obtained, the patient was brought back to the angiography suite and placed supine upon the angiography table.  The patient was connected to monitoring equipment. Moderate conscious sedation was administered with a face to face encounter with the patient throughout the procedure with my supervision of the RN administering medicines  and monitoring the patient's vital signs and mental status throughout from the start of the procedure until the patient was taken to the recovery room. The left arm was prepped and draped in the standard fashion for a percutaneous access intervention.  Under ultrasound guidance, the left arm arteriovenous fistula was cannulated with a micropuncture needle under direct ultrasound guidance where it was patent and a permanent image was performed.  The microwire was advanced into the fistula and the needle was exchanged for the a microsheath.  I then upsized to a 6 Fr Sheath and imaging was performed.  Hand injections were completed to image the access including the central venous system. This demonstrated aneurysmal access which is patent including the stent that goes into the subclavian vein until the proximal edge of the stent more centrally just into the subclavian vein with about a 55 to 60% stenosis.  The remainder of the central venous circulation was widely patent.  Based on the images, this patient will need intervention to this central venous stenosis. I then gave the patient 3000 units of intravenous heparin.  I then crossed the stenosis with a Magic Tourqe wire.  Based on the imaging, a 7 mm x 6 cm Lutonix drug-coated angioplasty balloon was selected.  The balloon was centered around the leading edge of the stent in the subclavian vein stenosis and inflated to 12 ATM for 1 minute(s).  On completion imaging, a 20% residual stenosis was present.     Based on the completion imaging, no further intervention is necessary.  The wire and balloon were removed from the sheath.  A 4-0 Monocryl purse-string suture was sewn around the sheath.  The sheath was removed while tying down the suture.  A sterile bandage was applied to the puncture site.  COMPLICATIONS: None  CONDITION: Stable   Leotis Pain  04/01/2021 9:42 AM   This note was created with Dragon Medical transcription system. Any errors in  dictation are purely unintentional.

## 2021-04-01 NOTE — Progress Notes (Signed)
Thrill noted in  fistula in LUE

## 2021-04-01 NOTE — Progress Notes (Signed)
Tolerated 3 hrs of HD tx, cannulated without difficulty using 15g needle.  UF goal of 3L achieved.  Arterial and Venous site held for 15 minutes post needle removal.  Report given to primary RN

## 2021-04-02 ENCOUNTER — Ambulatory Visit: Payer: Medicare Other

## 2021-04-03 DIAGNOSIS — D689 Coagulation defect, unspecified: Secondary | ICD-10-CM | POA: Diagnosis not present

## 2021-04-03 DIAGNOSIS — N186 End stage renal disease: Secondary | ICD-10-CM | POA: Diagnosis not present

## 2021-04-03 DIAGNOSIS — D509 Iron deficiency anemia, unspecified: Secondary | ICD-10-CM | POA: Diagnosis not present

## 2021-04-03 DIAGNOSIS — I129 Hypertensive chronic kidney disease with stage 1 through stage 4 chronic kidney disease, or unspecified chronic kidney disease: Secondary | ICD-10-CM | POA: Diagnosis not present

## 2021-04-03 DIAGNOSIS — Z992 Dependence on renal dialysis: Secondary | ICD-10-CM | POA: Diagnosis not present

## 2021-04-03 DIAGNOSIS — D631 Anemia in chronic kidney disease: Secondary | ICD-10-CM | POA: Diagnosis not present

## 2021-04-03 DIAGNOSIS — E1129 Type 2 diabetes mellitus with other diabetic kidney complication: Secondary | ICD-10-CM | POA: Diagnosis not present

## 2021-04-03 DIAGNOSIS — N2581 Secondary hyperparathyroidism of renal origin: Secondary | ICD-10-CM | POA: Diagnosis not present

## 2021-04-05 DIAGNOSIS — R52 Pain, unspecified: Secondary | ICD-10-CM | POA: Diagnosis not present

## 2021-04-05 DIAGNOSIS — D631 Anemia in chronic kidney disease: Secondary | ICD-10-CM | POA: Diagnosis not present

## 2021-04-05 DIAGNOSIS — Z992 Dependence on renal dialysis: Secondary | ICD-10-CM | POA: Diagnosis not present

## 2021-04-05 DIAGNOSIS — N186 End stage renal disease: Secondary | ICD-10-CM | POA: Diagnosis not present

## 2021-04-05 DIAGNOSIS — E1129 Type 2 diabetes mellitus with other diabetic kidney complication: Secondary | ICD-10-CM | POA: Diagnosis not present

## 2021-04-05 DIAGNOSIS — L299 Pruritus, unspecified: Secondary | ICD-10-CM | POA: Diagnosis not present

## 2021-04-05 DIAGNOSIS — D509 Iron deficiency anemia, unspecified: Secondary | ICD-10-CM | POA: Diagnosis not present

## 2021-04-05 DIAGNOSIS — N2581 Secondary hyperparathyroidism of renal origin: Secondary | ICD-10-CM | POA: Diagnosis not present

## 2021-04-05 DIAGNOSIS — D689 Coagulation defect, unspecified: Secondary | ICD-10-CM | POA: Diagnosis not present

## 2021-04-08 DIAGNOSIS — Z992 Dependence on renal dialysis: Secondary | ICD-10-CM | POA: Diagnosis not present

## 2021-04-08 DIAGNOSIS — L299 Pruritus, unspecified: Secondary | ICD-10-CM | POA: Diagnosis not present

## 2021-04-08 DIAGNOSIS — N186 End stage renal disease: Secondary | ICD-10-CM | POA: Diagnosis not present

## 2021-04-08 DIAGNOSIS — R52 Pain, unspecified: Secondary | ICD-10-CM | POA: Diagnosis not present

## 2021-04-08 DIAGNOSIS — D631 Anemia in chronic kidney disease: Secondary | ICD-10-CM | POA: Diagnosis not present

## 2021-04-08 DIAGNOSIS — N2581 Secondary hyperparathyroidism of renal origin: Secondary | ICD-10-CM | POA: Diagnosis not present

## 2021-04-08 DIAGNOSIS — D509 Iron deficiency anemia, unspecified: Secondary | ICD-10-CM | POA: Diagnosis not present

## 2021-04-08 DIAGNOSIS — E1129 Type 2 diabetes mellitus with other diabetic kidney complication: Secondary | ICD-10-CM | POA: Diagnosis not present

## 2021-04-08 DIAGNOSIS — D689 Coagulation defect, unspecified: Secondary | ICD-10-CM | POA: Diagnosis not present

## 2021-04-10 DIAGNOSIS — N186 End stage renal disease: Secondary | ICD-10-CM | POA: Diagnosis not present

## 2021-04-10 DIAGNOSIS — D631 Anemia in chronic kidney disease: Secondary | ICD-10-CM | POA: Diagnosis not present

## 2021-04-10 DIAGNOSIS — D509 Iron deficiency anemia, unspecified: Secondary | ICD-10-CM | POA: Diagnosis not present

## 2021-04-10 DIAGNOSIS — E1129 Type 2 diabetes mellitus with other diabetic kidney complication: Secondary | ICD-10-CM | POA: Diagnosis not present

## 2021-04-10 DIAGNOSIS — N2581 Secondary hyperparathyroidism of renal origin: Secondary | ICD-10-CM | POA: Diagnosis not present

## 2021-04-10 DIAGNOSIS — Z992 Dependence on renal dialysis: Secondary | ICD-10-CM | POA: Diagnosis not present

## 2021-04-10 DIAGNOSIS — D689 Coagulation defect, unspecified: Secondary | ICD-10-CM | POA: Diagnosis not present

## 2021-04-10 DIAGNOSIS — R52 Pain, unspecified: Secondary | ICD-10-CM | POA: Diagnosis not present

## 2021-04-10 DIAGNOSIS — L299 Pruritus, unspecified: Secondary | ICD-10-CM | POA: Diagnosis not present

## 2021-04-12 DIAGNOSIS — L299 Pruritus, unspecified: Secondary | ICD-10-CM | POA: Diagnosis not present

## 2021-04-12 DIAGNOSIS — D631 Anemia in chronic kidney disease: Secondary | ICD-10-CM | POA: Diagnosis not present

## 2021-04-12 DIAGNOSIS — Z992 Dependence on renal dialysis: Secondary | ICD-10-CM | POA: Diagnosis not present

## 2021-04-12 DIAGNOSIS — D509 Iron deficiency anemia, unspecified: Secondary | ICD-10-CM | POA: Diagnosis not present

## 2021-04-12 DIAGNOSIS — R52 Pain, unspecified: Secondary | ICD-10-CM | POA: Diagnosis not present

## 2021-04-12 DIAGNOSIS — N186 End stage renal disease: Secondary | ICD-10-CM | POA: Diagnosis not present

## 2021-04-12 DIAGNOSIS — N2581 Secondary hyperparathyroidism of renal origin: Secondary | ICD-10-CM | POA: Diagnosis not present

## 2021-04-12 DIAGNOSIS — E1129 Type 2 diabetes mellitus with other diabetic kidney complication: Secondary | ICD-10-CM | POA: Diagnosis not present

## 2021-04-12 DIAGNOSIS — D689 Coagulation defect, unspecified: Secondary | ICD-10-CM | POA: Diagnosis not present

## 2021-04-15 DIAGNOSIS — D631 Anemia in chronic kidney disease: Secondary | ICD-10-CM | POA: Diagnosis not present

## 2021-04-15 DIAGNOSIS — L299 Pruritus, unspecified: Secondary | ICD-10-CM | POA: Diagnosis not present

## 2021-04-15 DIAGNOSIS — N186 End stage renal disease: Secondary | ICD-10-CM | POA: Diagnosis not present

## 2021-04-15 DIAGNOSIS — D689 Coagulation defect, unspecified: Secondary | ICD-10-CM | POA: Diagnosis not present

## 2021-04-15 DIAGNOSIS — D509 Iron deficiency anemia, unspecified: Secondary | ICD-10-CM | POA: Diagnosis not present

## 2021-04-15 DIAGNOSIS — E1129 Type 2 diabetes mellitus with other diabetic kidney complication: Secondary | ICD-10-CM | POA: Diagnosis not present

## 2021-04-15 DIAGNOSIS — Z992 Dependence on renal dialysis: Secondary | ICD-10-CM | POA: Diagnosis not present

## 2021-04-15 DIAGNOSIS — R52 Pain, unspecified: Secondary | ICD-10-CM | POA: Diagnosis not present

## 2021-04-15 DIAGNOSIS — N2581 Secondary hyperparathyroidism of renal origin: Secondary | ICD-10-CM | POA: Diagnosis not present

## 2021-04-17 DIAGNOSIS — N2581 Secondary hyperparathyroidism of renal origin: Secondary | ICD-10-CM | POA: Diagnosis not present

## 2021-04-17 DIAGNOSIS — R52 Pain, unspecified: Secondary | ICD-10-CM | POA: Diagnosis not present

## 2021-04-17 DIAGNOSIS — D689 Coagulation defect, unspecified: Secondary | ICD-10-CM | POA: Diagnosis not present

## 2021-04-17 DIAGNOSIS — L299 Pruritus, unspecified: Secondary | ICD-10-CM | POA: Diagnosis not present

## 2021-04-17 DIAGNOSIS — D509 Iron deficiency anemia, unspecified: Secondary | ICD-10-CM | POA: Diagnosis not present

## 2021-04-17 DIAGNOSIS — E1129 Type 2 diabetes mellitus with other diabetic kidney complication: Secondary | ICD-10-CM | POA: Diagnosis not present

## 2021-04-17 DIAGNOSIS — N186 End stage renal disease: Secondary | ICD-10-CM | POA: Diagnosis not present

## 2021-04-17 DIAGNOSIS — Z992 Dependence on renal dialysis: Secondary | ICD-10-CM | POA: Diagnosis not present

## 2021-04-17 DIAGNOSIS — D631 Anemia in chronic kidney disease: Secondary | ICD-10-CM | POA: Diagnosis not present

## 2021-04-19 DIAGNOSIS — L299 Pruritus, unspecified: Secondary | ICD-10-CM | POA: Diagnosis not present

## 2021-04-19 DIAGNOSIS — D509 Iron deficiency anemia, unspecified: Secondary | ICD-10-CM | POA: Diagnosis not present

## 2021-04-19 DIAGNOSIS — N2581 Secondary hyperparathyroidism of renal origin: Secondary | ICD-10-CM | POA: Diagnosis not present

## 2021-04-19 DIAGNOSIS — E1129 Type 2 diabetes mellitus with other diabetic kidney complication: Secondary | ICD-10-CM | POA: Diagnosis not present

## 2021-04-19 DIAGNOSIS — D689 Coagulation defect, unspecified: Secondary | ICD-10-CM | POA: Diagnosis not present

## 2021-04-19 DIAGNOSIS — N186 End stage renal disease: Secondary | ICD-10-CM | POA: Diagnosis not present

## 2021-04-19 DIAGNOSIS — D631 Anemia in chronic kidney disease: Secondary | ICD-10-CM | POA: Diagnosis not present

## 2021-04-19 DIAGNOSIS — R52 Pain, unspecified: Secondary | ICD-10-CM | POA: Diagnosis not present

## 2021-04-19 DIAGNOSIS — Z992 Dependence on renal dialysis: Secondary | ICD-10-CM | POA: Diagnosis not present

## 2021-04-22 DIAGNOSIS — D689 Coagulation defect, unspecified: Secondary | ICD-10-CM | POA: Diagnosis not present

## 2021-04-22 DIAGNOSIS — D631 Anemia in chronic kidney disease: Secondary | ICD-10-CM | POA: Diagnosis not present

## 2021-04-22 DIAGNOSIS — E1129 Type 2 diabetes mellitus with other diabetic kidney complication: Secondary | ICD-10-CM | POA: Diagnosis not present

## 2021-04-22 DIAGNOSIS — R52 Pain, unspecified: Secondary | ICD-10-CM | POA: Diagnosis not present

## 2021-04-22 DIAGNOSIS — N186 End stage renal disease: Secondary | ICD-10-CM | POA: Diagnosis not present

## 2021-04-22 DIAGNOSIS — Z992 Dependence on renal dialysis: Secondary | ICD-10-CM | POA: Diagnosis not present

## 2021-04-22 DIAGNOSIS — D509 Iron deficiency anemia, unspecified: Secondary | ICD-10-CM | POA: Diagnosis not present

## 2021-04-22 DIAGNOSIS — L299 Pruritus, unspecified: Secondary | ICD-10-CM | POA: Diagnosis not present

## 2021-04-22 DIAGNOSIS — N2581 Secondary hyperparathyroidism of renal origin: Secondary | ICD-10-CM | POA: Diagnosis not present

## 2021-04-24 DIAGNOSIS — N186 End stage renal disease: Secondary | ICD-10-CM | POA: Diagnosis not present

## 2021-04-24 DIAGNOSIS — D689 Coagulation defect, unspecified: Secondary | ICD-10-CM | POA: Diagnosis not present

## 2021-04-24 DIAGNOSIS — L299 Pruritus, unspecified: Secondary | ICD-10-CM | POA: Diagnosis not present

## 2021-04-24 DIAGNOSIS — R52 Pain, unspecified: Secondary | ICD-10-CM | POA: Diagnosis not present

## 2021-04-24 DIAGNOSIS — D509 Iron deficiency anemia, unspecified: Secondary | ICD-10-CM | POA: Diagnosis not present

## 2021-04-24 DIAGNOSIS — E1129 Type 2 diabetes mellitus with other diabetic kidney complication: Secondary | ICD-10-CM | POA: Diagnosis not present

## 2021-04-24 DIAGNOSIS — D631 Anemia in chronic kidney disease: Secondary | ICD-10-CM | POA: Diagnosis not present

## 2021-04-24 DIAGNOSIS — N2581 Secondary hyperparathyroidism of renal origin: Secondary | ICD-10-CM | POA: Diagnosis not present

## 2021-04-24 DIAGNOSIS — Z992 Dependence on renal dialysis: Secondary | ICD-10-CM | POA: Diagnosis not present

## 2021-04-26 DIAGNOSIS — D631 Anemia in chronic kidney disease: Secondary | ICD-10-CM | POA: Diagnosis not present

## 2021-04-26 DIAGNOSIS — D689 Coagulation defect, unspecified: Secondary | ICD-10-CM | POA: Diagnosis not present

## 2021-04-26 DIAGNOSIS — R52 Pain, unspecified: Secondary | ICD-10-CM | POA: Diagnosis not present

## 2021-04-26 DIAGNOSIS — N186 End stage renal disease: Secondary | ICD-10-CM | POA: Diagnosis not present

## 2021-04-26 DIAGNOSIS — N2581 Secondary hyperparathyroidism of renal origin: Secondary | ICD-10-CM | POA: Diagnosis not present

## 2021-04-26 DIAGNOSIS — Z992 Dependence on renal dialysis: Secondary | ICD-10-CM | POA: Diagnosis not present

## 2021-04-26 DIAGNOSIS — D509 Iron deficiency anemia, unspecified: Secondary | ICD-10-CM | POA: Diagnosis not present

## 2021-04-26 DIAGNOSIS — L299 Pruritus, unspecified: Secondary | ICD-10-CM | POA: Diagnosis not present

## 2021-04-26 DIAGNOSIS — E1129 Type 2 diabetes mellitus with other diabetic kidney complication: Secondary | ICD-10-CM | POA: Diagnosis not present

## 2021-04-29 DIAGNOSIS — L299 Pruritus, unspecified: Secondary | ICD-10-CM | POA: Diagnosis not present

## 2021-04-29 DIAGNOSIS — N186 End stage renal disease: Secondary | ICD-10-CM | POA: Diagnosis not present

## 2021-04-29 DIAGNOSIS — N2581 Secondary hyperparathyroidism of renal origin: Secondary | ICD-10-CM | POA: Diagnosis not present

## 2021-04-29 DIAGNOSIS — R52 Pain, unspecified: Secondary | ICD-10-CM | POA: Diagnosis not present

## 2021-04-29 DIAGNOSIS — Z992 Dependence on renal dialysis: Secondary | ICD-10-CM | POA: Diagnosis not present

## 2021-04-29 DIAGNOSIS — D689 Coagulation defect, unspecified: Secondary | ICD-10-CM | POA: Diagnosis not present

## 2021-04-29 DIAGNOSIS — D631 Anemia in chronic kidney disease: Secondary | ICD-10-CM | POA: Diagnosis not present

## 2021-04-29 DIAGNOSIS — E1129 Type 2 diabetes mellitus with other diabetic kidney complication: Secondary | ICD-10-CM | POA: Diagnosis not present

## 2021-04-29 DIAGNOSIS — D509 Iron deficiency anemia, unspecified: Secondary | ICD-10-CM | POA: Diagnosis not present

## 2021-05-01 DIAGNOSIS — D509 Iron deficiency anemia, unspecified: Secondary | ICD-10-CM | POA: Diagnosis not present

## 2021-05-01 DIAGNOSIS — Z992 Dependence on renal dialysis: Secondary | ICD-10-CM | POA: Diagnosis not present

## 2021-05-01 DIAGNOSIS — R52 Pain, unspecified: Secondary | ICD-10-CM | POA: Diagnosis not present

## 2021-05-01 DIAGNOSIS — N186 End stage renal disease: Secondary | ICD-10-CM | POA: Diagnosis not present

## 2021-05-01 DIAGNOSIS — D631 Anemia in chronic kidney disease: Secondary | ICD-10-CM | POA: Diagnosis not present

## 2021-05-01 DIAGNOSIS — L299 Pruritus, unspecified: Secondary | ICD-10-CM | POA: Diagnosis not present

## 2021-05-01 DIAGNOSIS — D689 Coagulation defect, unspecified: Secondary | ICD-10-CM | POA: Diagnosis not present

## 2021-05-01 DIAGNOSIS — N2581 Secondary hyperparathyroidism of renal origin: Secondary | ICD-10-CM | POA: Diagnosis not present

## 2021-05-01 DIAGNOSIS — E1129 Type 2 diabetes mellitus with other diabetic kidney complication: Secondary | ICD-10-CM | POA: Diagnosis not present

## 2021-05-03 DIAGNOSIS — R52 Pain, unspecified: Secondary | ICD-10-CM | POA: Diagnosis not present

## 2021-05-03 DIAGNOSIS — N186 End stage renal disease: Secondary | ICD-10-CM | POA: Diagnosis not present

## 2021-05-03 DIAGNOSIS — D689 Coagulation defect, unspecified: Secondary | ICD-10-CM | POA: Diagnosis not present

## 2021-05-03 DIAGNOSIS — Z992 Dependence on renal dialysis: Secondary | ICD-10-CM | POA: Diagnosis not present

## 2021-05-03 DIAGNOSIS — I129 Hypertensive chronic kidney disease with stage 1 through stage 4 chronic kidney disease, or unspecified chronic kidney disease: Secondary | ICD-10-CM | POA: Diagnosis not present

## 2021-05-03 DIAGNOSIS — N2581 Secondary hyperparathyroidism of renal origin: Secondary | ICD-10-CM | POA: Diagnosis not present

## 2021-05-03 DIAGNOSIS — D509 Iron deficiency anemia, unspecified: Secondary | ICD-10-CM | POA: Diagnosis not present

## 2021-05-03 DIAGNOSIS — D631 Anemia in chronic kidney disease: Secondary | ICD-10-CM | POA: Diagnosis not present

## 2021-05-03 DIAGNOSIS — E1129 Type 2 diabetes mellitus with other diabetic kidney complication: Secondary | ICD-10-CM | POA: Diagnosis not present

## 2021-05-03 DIAGNOSIS — L299 Pruritus, unspecified: Secondary | ICD-10-CM | POA: Diagnosis not present

## 2021-05-06 DIAGNOSIS — L299 Pruritus, unspecified: Secondary | ICD-10-CM | POA: Diagnosis not present

## 2021-05-06 DIAGNOSIS — Z992 Dependence on renal dialysis: Secondary | ICD-10-CM | POA: Diagnosis not present

## 2021-05-06 DIAGNOSIS — D689 Coagulation defect, unspecified: Secondary | ICD-10-CM | POA: Diagnosis not present

## 2021-05-06 DIAGNOSIS — N2581 Secondary hyperparathyroidism of renal origin: Secondary | ICD-10-CM | POA: Diagnosis not present

## 2021-05-06 DIAGNOSIS — E1129 Type 2 diabetes mellitus with other diabetic kidney complication: Secondary | ICD-10-CM | POA: Diagnosis not present

## 2021-05-06 DIAGNOSIS — N186 End stage renal disease: Secondary | ICD-10-CM | POA: Diagnosis not present

## 2021-05-06 DIAGNOSIS — D631 Anemia in chronic kidney disease: Secondary | ICD-10-CM | POA: Diagnosis not present

## 2021-05-06 DIAGNOSIS — Z23 Encounter for immunization: Secondary | ICD-10-CM | POA: Diagnosis not present

## 2021-05-08 DIAGNOSIS — D689 Coagulation defect, unspecified: Secondary | ICD-10-CM | POA: Diagnosis not present

## 2021-05-08 DIAGNOSIS — L299 Pruritus, unspecified: Secondary | ICD-10-CM | POA: Diagnosis not present

## 2021-05-08 DIAGNOSIS — Z992 Dependence on renal dialysis: Secondary | ICD-10-CM | POA: Diagnosis not present

## 2021-05-08 DIAGNOSIS — N186 End stage renal disease: Secondary | ICD-10-CM | POA: Diagnosis not present

## 2021-05-08 DIAGNOSIS — Z23 Encounter for immunization: Secondary | ICD-10-CM | POA: Diagnosis not present

## 2021-05-08 DIAGNOSIS — D631 Anemia in chronic kidney disease: Secondary | ICD-10-CM | POA: Diagnosis not present

## 2021-05-08 DIAGNOSIS — E1129 Type 2 diabetes mellitus with other diabetic kidney complication: Secondary | ICD-10-CM | POA: Diagnosis not present

## 2021-05-08 DIAGNOSIS — N2581 Secondary hyperparathyroidism of renal origin: Secondary | ICD-10-CM | POA: Diagnosis not present

## 2021-05-10 ENCOUNTER — Other Ambulatory Visit: Payer: Self-pay

## 2021-05-10 ENCOUNTER — Ambulatory Visit
Admit: 2021-05-10 | Discharge: 2021-05-10 | Disposition: A | Discharge status: Home / Self Care | Payer: Medicare Other | Attending: Internal Medicine | Admitting: Internal Medicine

## 2021-05-10 DIAGNOSIS — D631 Anemia in chronic kidney disease: Secondary | ICD-10-CM | POA: Diagnosis not present

## 2021-05-10 DIAGNOSIS — N2581 Secondary hyperparathyroidism of renal origin: Secondary | ICD-10-CM | POA: Diagnosis not present

## 2021-05-10 DIAGNOSIS — Z1231 Encounter for screening mammogram for malignant neoplasm of breast: Secondary | ICD-10-CM

## 2021-05-10 DIAGNOSIS — N186 End stage renal disease: Secondary | ICD-10-CM | POA: Diagnosis not present

## 2021-05-10 DIAGNOSIS — E1129 Type 2 diabetes mellitus with other diabetic kidney complication: Secondary | ICD-10-CM | POA: Diagnosis not present

## 2021-05-10 DIAGNOSIS — Z992 Dependence on renal dialysis: Secondary | ICD-10-CM | POA: Diagnosis not present

## 2021-05-10 DIAGNOSIS — D689 Coagulation defect, unspecified: Secondary | ICD-10-CM | POA: Diagnosis not present

## 2021-05-10 DIAGNOSIS — L299 Pruritus, unspecified: Secondary | ICD-10-CM | POA: Diagnosis not present

## 2021-05-10 DIAGNOSIS — Z23 Encounter for immunization: Secondary | ICD-10-CM | POA: Diagnosis not present

## 2021-05-10 IMAGING — MG MM DIGITAL SCREENING BILAT W/ TOMO AND CAD
8 series · 8 of 24 positions shown · non-contrast
Comparison: None.

CLINICAL DATA: Screening.

EXAM:
DIGITAL SCREENING BILATERAL MAMMOGRAM WITH TOMOSYNTHESIS AND CAD
TECHNIQUE: Bilateral screening digital craniocaudal and mediolateral oblique
mammograms were obtained. Bilateral screening digital breast
tomosynthesis was performed. The images were evaluated with
computer-aided detection.

[R CC synth-2D]
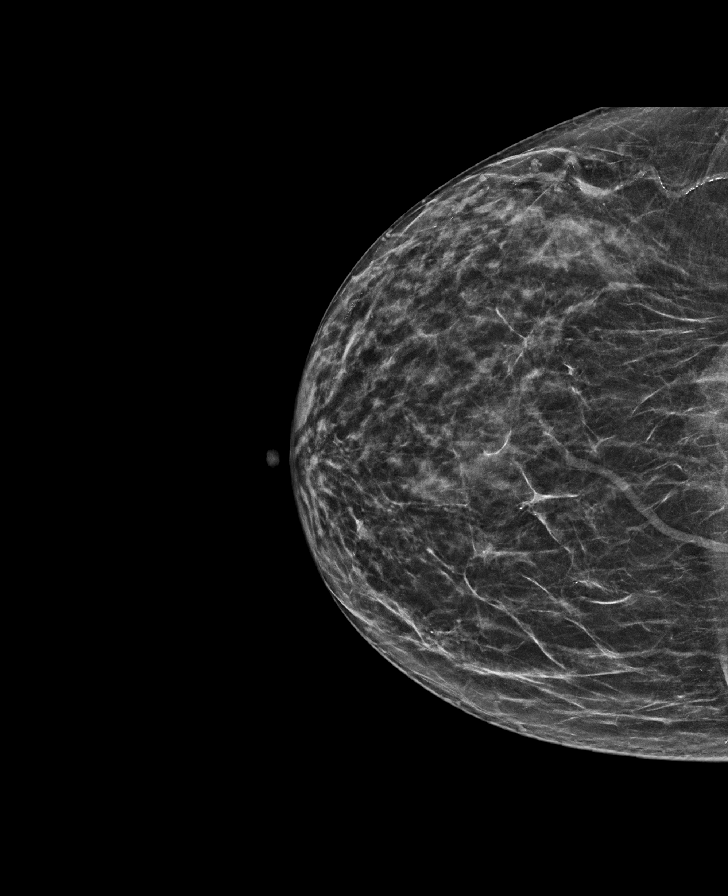

[L MLO synth-2D]
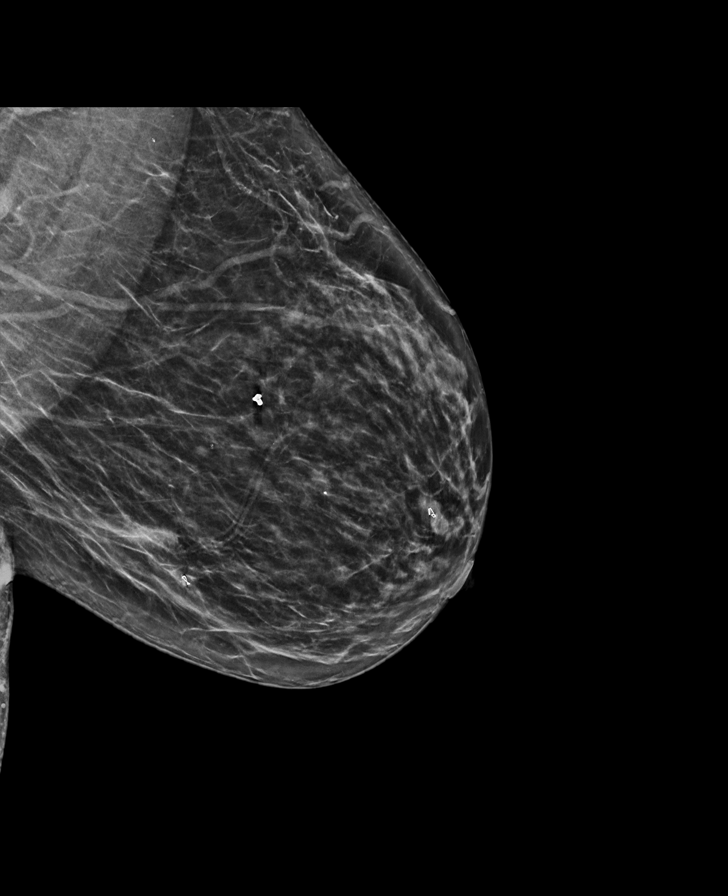

[R MLO synth-2D]
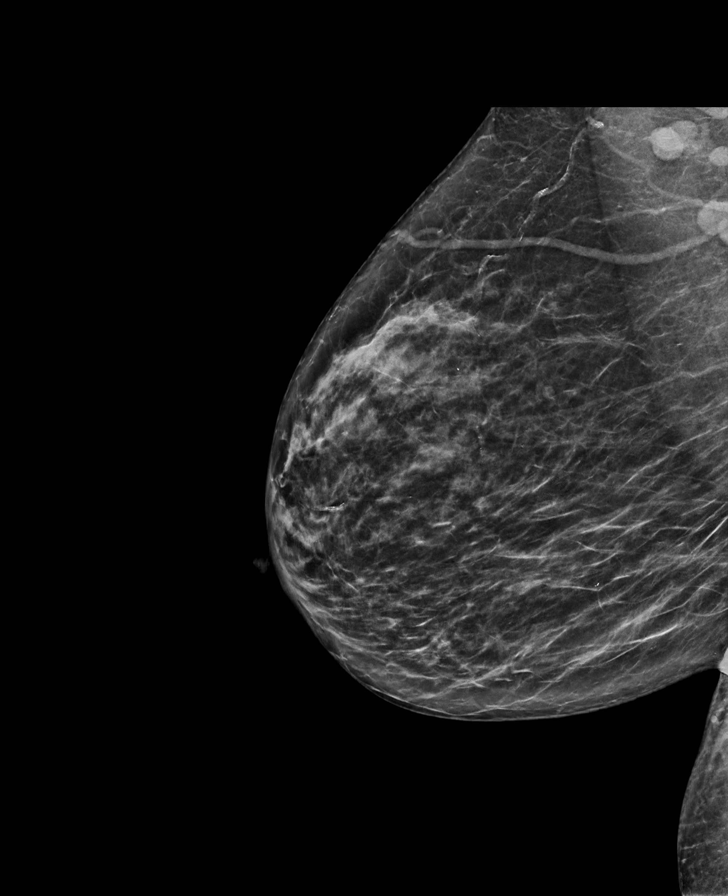

[L CC synth-2D]
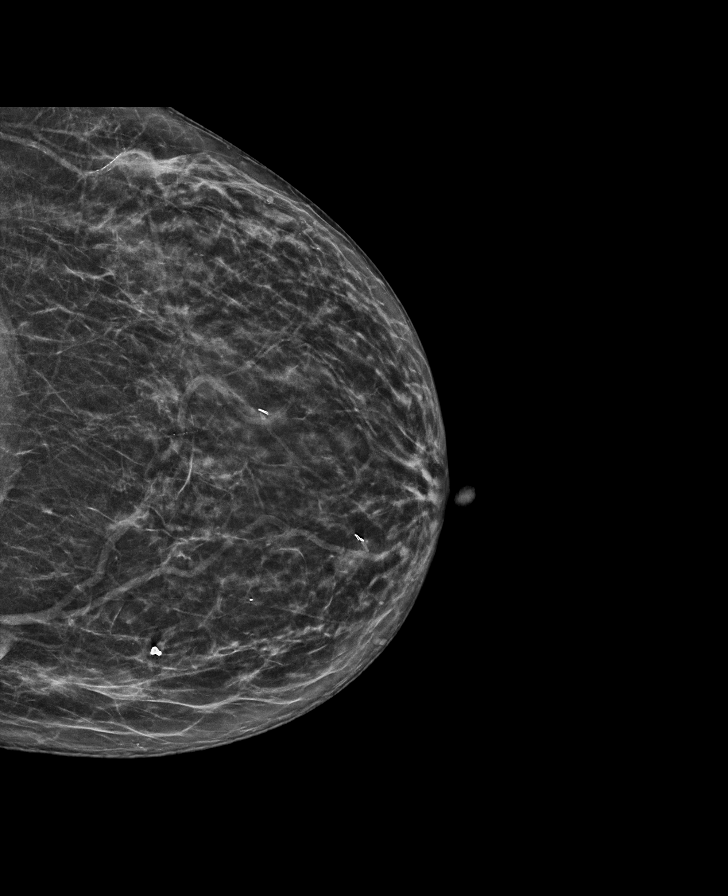

[L MLO tomo · tomo slice 34/67.0]
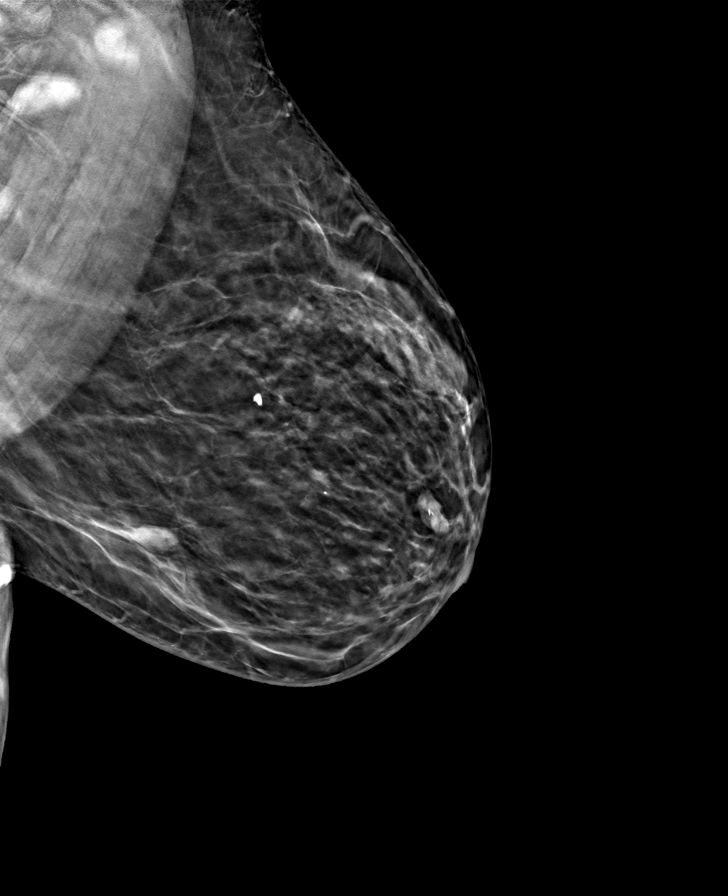

[L CC tomo · tomo slice 30/59.0]
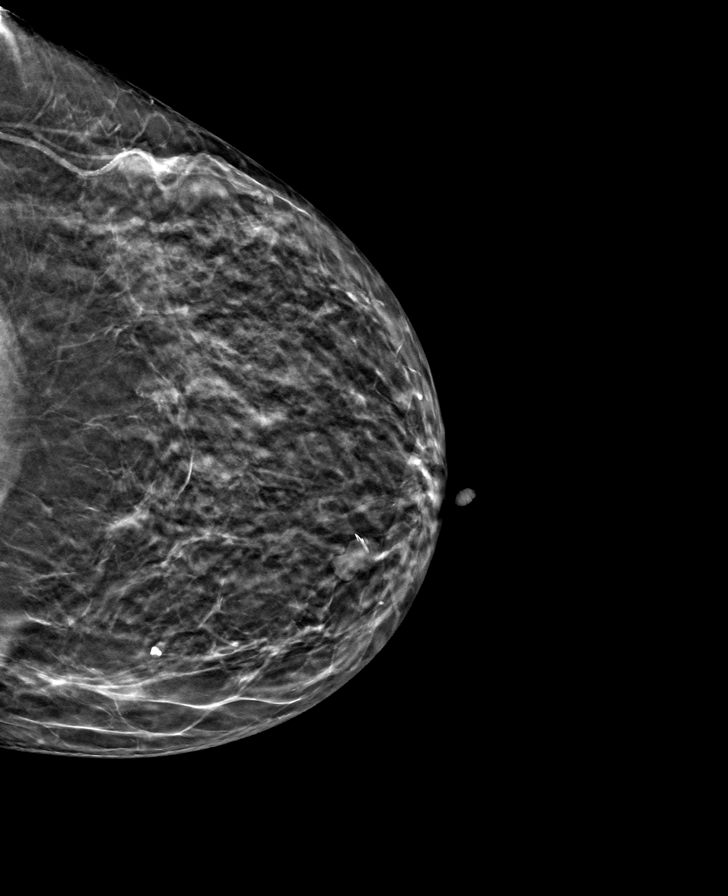

[R CC tomo · tomo slice 31/62.0]
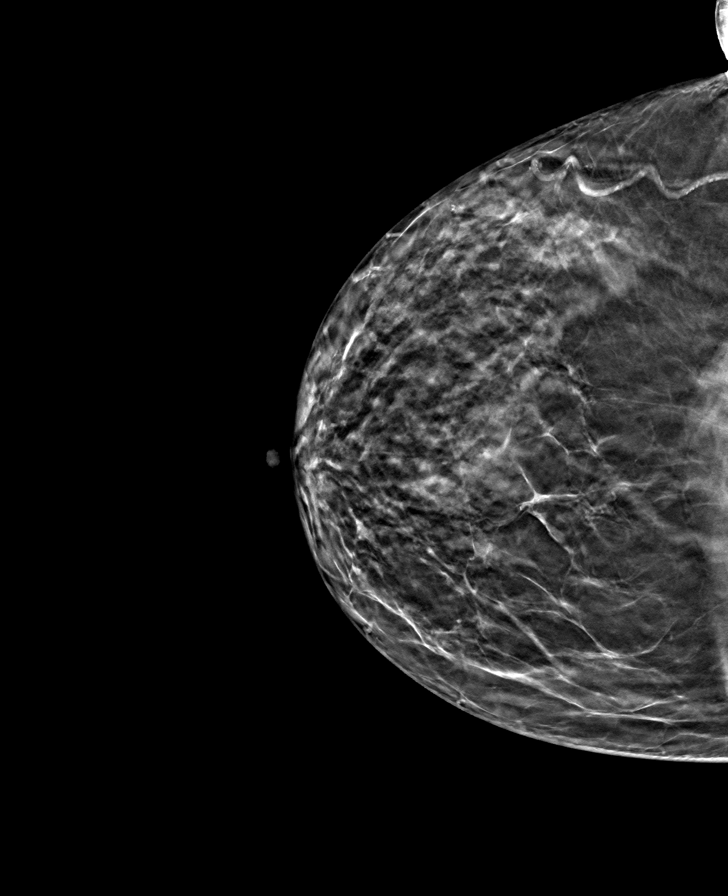

[R MLO tomo · tomo slice 35/68.0]
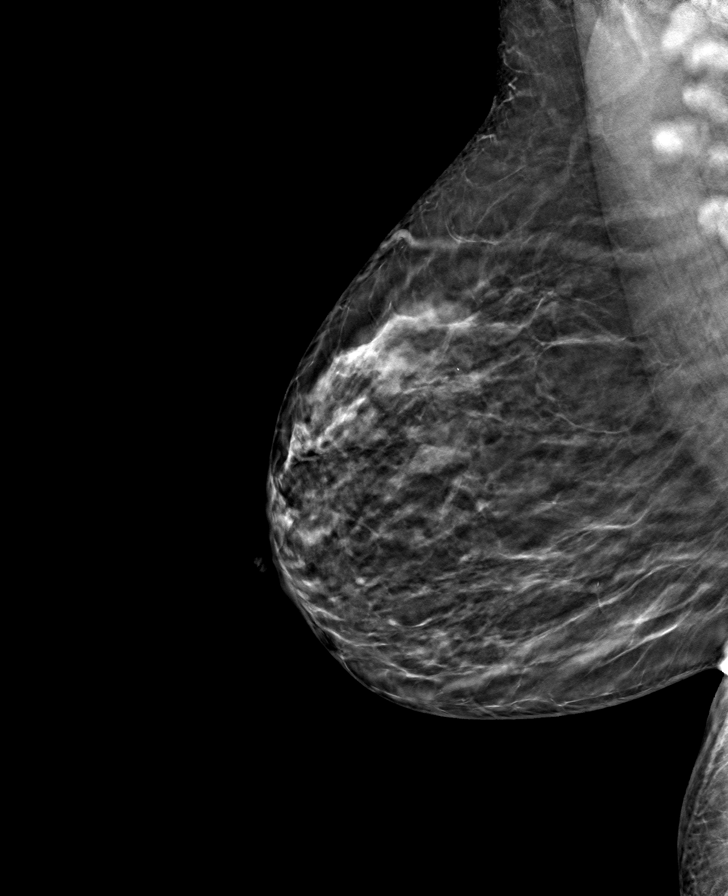

[8 of 24 positions shown; findings below may reference images not displayed]

ACR Breast Density Category b: There are scattered areas of
fibroglandular density.
FINDINGS: In the right breast possible enlarged lymph nodes require further
evaluation.

In the left breast possible enlarged lymph nodes an asymmetry
requires further evaluation.
IMPRESSION: Further evaluation is suggested for possible lymphadenopathy in the
right breast.

Further evaluation is suggested for possible lymphadenopathy and an
asymmetry in the left breast.

RECOMMENDATION:
Diagnostic mammogram and possibly ultrasound of both breasts.
(Code:[U4])

The patient will be contacted regarding the findings, and additional
imaging will be scheduled.

BI-RADS CATEGORY  0: Incomplete. Need additional imaging evaluation
and/or prior mammograms for comparison.

## 2021-05-13 ENCOUNTER — Other Ambulatory Visit (INDEPENDENT_AMBULATORY_CARE_PROVIDER_SITE_OTHER): Payer: Self-pay | Admitting: Vascular Surgery

## 2021-05-13 DIAGNOSIS — N2581 Secondary hyperparathyroidism of renal origin: Secondary | ICD-10-CM | POA: Diagnosis not present

## 2021-05-13 DIAGNOSIS — E1129 Type 2 diabetes mellitus with other diabetic kidney complication: Secondary | ICD-10-CM | POA: Diagnosis not present

## 2021-05-13 DIAGNOSIS — L299 Pruritus, unspecified: Secondary | ICD-10-CM | POA: Diagnosis not present

## 2021-05-13 DIAGNOSIS — Z23 Encounter for immunization: Secondary | ICD-10-CM | POA: Diagnosis not present

## 2021-05-13 DIAGNOSIS — D631 Anemia in chronic kidney disease: Secondary | ICD-10-CM | POA: Diagnosis not present

## 2021-05-13 DIAGNOSIS — N186 End stage renal disease: Secondary | ICD-10-CM

## 2021-05-13 DIAGNOSIS — Z9862 Peripheral vascular angioplasty status: Secondary | ICD-10-CM

## 2021-05-13 DIAGNOSIS — Z992 Dependence on renal dialysis: Secondary | ICD-10-CM | POA: Diagnosis not present

## 2021-05-13 DIAGNOSIS — D689 Coagulation defect, unspecified: Secondary | ICD-10-CM | POA: Diagnosis not present

## 2021-05-14 ENCOUNTER — Ambulatory Visit (INDEPENDENT_AMBULATORY_CARE_PROVIDER_SITE_OTHER): Payer: Medicare Other | Admitting: Nurse Practitioner

## 2021-05-14 ENCOUNTER — Ambulatory Visit (INDEPENDENT_AMBULATORY_CARE_PROVIDER_SITE_OTHER): Payer: Medicare Other

## 2021-05-14 ENCOUNTER — Other Ambulatory Visit: Payer: Self-pay

## 2021-05-14 VITALS — BP 171/93 | HR 101 | Ht 67.0 in | Wt 169.0 lb

## 2021-05-14 DIAGNOSIS — Z992 Dependence on renal dialysis: Secondary | ICD-10-CM

## 2021-05-14 DIAGNOSIS — Z9862 Peripheral vascular angioplasty status: Secondary | ICD-10-CM | POA: Diagnosis not present

## 2021-05-14 DIAGNOSIS — N186 End stage renal disease: Secondary | ICD-10-CM | POA: Diagnosis not present

## 2021-05-14 DIAGNOSIS — I10 Essential (primary) hypertension: Secondary | ICD-10-CM

## 2021-05-14 DIAGNOSIS — E1122 Type 2 diabetes mellitus with diabetic chronic kidney disease: Secondary | ICD-10-CM

## 2021-05-15 DIAGNOSIS — N186 End stage renal disease: Secondary | ICD-10-CM | POA: Diagnosis not present

## 2021-05-15 DIAGNOSIS — Z23 Encounter for immunization: Secondary | ICD-10-CM | POA: Diagnosis not present

## 2021-05-15 DIAGNOSIS — N2581 Secondary hyperparathyroidism of renal origin: Secondary | ICD-10-CM | POA: Diagnosis not present

## 2021-05-15 DIAGNOSIS — D631 Anemia in chronic kidney disease: Secondary | ICD-10-CM | POA: Diagnosis not present

## 2021-05-15 DIAGNOSIS — D689 Coagulation defect, unspecified: Secondary | ICD-10-CM | POA: Diagnosis not present

## 2021-05-15 DIAGNOSIS — E1129 Type 2 diabetes mellitus with other diabetic kidney complication: Secondary | ICD-10-CM | POA: Diagnosis not present

## 2021-05-15 DIAGNOSIS — Z992 Dependence on renal dialysis: Secondary | ICD-10-CM | POA: Diagnosis not present

## 2021-05-15 DIAGNOSIS — L299 Pruritus, unspecified: Secondary | ICD-10-CM | POA: Diagnosis not present

## 2021-05-15 NOTE — Progress Notes (Signed)
Patient called.  Patient aware. She will call the [facility] in 5 days if not contacted by breast center to schedule further imaging.

## 2021-05-16 ENCOUNTER — Other Ambulatory Visit: Payer: Self-pay | Admitting: Internal Medicine

## 2021-05-16 DIAGNOSIS — R928 Other abnormal and inconclusive findings on diagnostic imaging of breast: Secondary | ICD-10-CM

## 2021-05-17 DIAGNOSIS — N2581 Secondary hyperparathyroidism of renal origin: Secondary | ICD-10-CM | POA: Diagnosis not present

## 2021-05-17 DIAGNOSIS — Z992 Dependence on renal dialysis: Secondary | ICD-10-CM | POA: Diagnosis not present

## 2021-05-17 DIAGNOSIS — D631 Anemia in chronic kidney disease: Secondary | ICD-10-CM | POA: Diagnosis not present

## 2021-05-17 DIAGNOSIS — N186 End stage renal disease: Secondary | ICD-10-CM | POA: Diagnosis not present

## 2021-05-17 DIAGNOSIS — L299 Pruritus, unspecified: Secondary | ICD-10-CM | POA: Diagnosis not present

## 2021-05-17 DIAGNOSIS — E1129 Type 2 diabetes mellitus with other diabetic kidney complication: Secondary | ICD-10-CM | POA: Diagnosis not present

## 2021-05-17 DIAGNOSIS — Z23 Encounter for immunization: Secondary | ICD-10-CM | POA: Diagnosis not present

## 2021-05-17 DIAGNOSIS — D689 Coagulation defect, unspecified: Secondary | ICD-10-CM | POA: Diagnosis not present

## 2021-05-20 DIAGNOSIS — D631 Anemia in chronic kidney disease: Secondary | ICD-10-CM | POA: Diagnosis not present

## 2021-05-20 DIAGNOSIS — E1129 Type 2 diabetes mellitus with other diabetic kidney complication: Secondary | ICD-10-CM | POA: Diagnosis not present

## 2021-05-20 DIAGNOSIS — D689 Coagulation defect, unspecified: Secondary | ICD-10-CM | POA: Diagnosis not present

## 2021-05-20 DIAGNOSIS — Z23 Encounter for immunization: Secondary | ICD-10-CM | POA: Diagnosis not present

## 2021-05-20 DIAGNOSIS — Z992 Dependence on renal dialysis: Secondary | ICD-10-CM | POA: Diagnosis not present

## 2021-05-20 DIAGNOSIS — N2581 Secondary hyperparathyroidism of renal origin: Secondary | ICD-10-CM | POA: Diagnosis not present

## 2021-05-20 DIAGNOSIS — N186 End stage renal disease: Secondary | ICD-10-CM | POA: Diagnosis not present

## 2021-05-20 DIAGNOSIS — L299 Pruritus, unspecified: Secondary | ICD-10-CM | POA: Diagnosis not present

## 2021-05-21 ENCOUNTER — Encounter (INDEPENDENT_AMBULATORY_CARE_PROVIDER_SITE_OTHER): Payer: Self-pay | Admitting: Nurse Practitioner

## 2021-05-21 NOTE — Progress Notes (Signed)
Subjective:    Patient ID: Ruth Gutierrez, female    DOB: 06-Jul-1966, 55 y.o.   MRN: [id number] Chief Complaint  Patient presents with  . Follow-up    6wk aRMC post AV fistula . Hda     Ruth Gutierrez is a 55 year old female that returns to the office for followup status post intervention of the dialysis access left basilic vein transposition. Following the intervention the access function has significantly improved, with better flow rates and improved KT/V. The patient has not been experiencing increased bleeding times following decannulation and the patient denies increased recirculation. The patient denies an increase in arm swelling. At the present time the patient denies hand pain.  The patient denies amaurosis fugax or recent TIA symptoms. There are no recent neurological changes noted. The patient denies claudication symptoms or rest pain symptoms. The patient denies history of DVT, PE or superficial thrombophlebitis. The patient denies recent episodes of angina or shortness of breath.   Today noninvasive studies show flow volume of 1952.  Previous flow volume was 1546.  Today there is no significant the elevated velocities a wound within the fistula.       Review of Systems  Hematological:  Does not bruise/bleed easily.  All other systems reviewed and are negative.     Objective:   Physical Exam Vitals reviewed.  HENT:     Head: Normocephalic.  Cardiovascular:     Rate and Rhythm: Normal rate.     Pulses:          Radial pulses are 2+ on the left side.     Arteriovenous access: Left arteriovenous access is present.    Comments: Left basilic vein transposition with good thrill and bruit Pulmonary:     Effort: Pulmonary effort is normal.  Skin:    General: Skin is warm and dry.  Neurological:     Mental Status: She is alert and oriented to person, place, and time.  Psychiatric:        Mood and Affect: Mood normal.        Behavior: Behavior normal.         Thought Content: Thought content normal.        Judgment: Judgment normal.    BP (!) 171/93   Pulse (!) 101   Ht 5\' 7"  (1.702 m)   Wt 169 lb (76.7 kg)   LMP 06/02/2015   BMI 26.47 kg/m   Past Medical History:  Diagnosis Date  . Anemia of chronic disease   . Arthritis   . Deceased-donor kidney transplant    Performed at [facility], April 2010.  Initial ESRD due to HTN nephropathy  . Eczema   . ESRD (end stage renal disease) ([employer])    s/p transplant creatinine baseline 1.1  M/W/F dialysis  . FUO (fever of unknown origin) 05/17/2015  . GERD (gastroesophageal reflux disease)   . Headache(784.0)   . History of hyperparathyroidism   . Hypertension   . Peritonitis ([place]) 10/2019  . Shortness of breath   . Wears glasses     Social History   Socioeconomic History  . Marital status: Single    Spouse name: Not on file  . Number of children: 1  . Years of education: Not on file  . Highest education level: Not on file  Occupational History  . Occupation: unemployed  Tobacco Use  . Smoking status: Every Day    Packs/day: 0.50    Years: 27.00    Pack  years: 13.50    Types: Cigarettes  . Smokeless tobacco: Never  . Tobacco comments:    10 cigarettes a day  Vaping Use  . Vaping Use: Never used  Substance and Sexual Activity  . Alcohol use: No    Alcohol/week: 0.0 standard drinks    Comment: occasional drinker noted in the past  . Drug use: No  . Sexual activity: Yes    Partners: Male    Birth control/protection: None  Other Topics Concern  . Not on file  Social History Narrative   Single, 1 daughter   Lives with daughter (born 58) and grandkids   sister helps her with medications etc.   cigarette smoker, rare EtOH, no drugs   Social Determinants of Radio broadcast assistant Strain: Not on file  Food Insecurity: Not on file  Transportation Needs: Not on file  Physical Activity: Not on file  Stress: Not on file  Social Connections: Not on file  Intimate  Partner Violence: Not on file    Past Surgical History:  Procedure Laterality Date  . A/V FISTULAGRAM Left 02/12/2017   Procedure: A/V Fistulagram;  Surgeon: Algernon Huxley, MD;  Location: [place] [facility];  Service: Cardiovascular;  Laterality: Left;  . A/V FISTULAGRAM Left 12/30/2017   Procedure: A/V FISTULAGRAM;  Surgeon: Algernon Huxley, MD;  Location: [place] [facility];  Service: Cardiovascular;  Laterality: Left;  . A/V FISTULAGRAM Left 04/01/2021   Procedure: A/V FISTULAGRAM;  Surgeon: Algernon Huxley, MD;  Location: [place] [facility];  Service: Cardiovascular;  Laterality: Left;  . A/V SHUNT INTERVENTION N/A 02/12/2017   Procedure: A/V Shunt Intervention;  Surgeon: Algernon Huxley, MD;  Location: [place] [facility];  Service: Cardiovascular;  Laterality: N/A;  . AV FISTULA PLACEMENT    . BASCILIC VEIN TRANSPOSITION Left 10/30/2014   Procedure: LEFT BASCILIC VEIN TRANSPOSITION;  Surgeon: Rosetta Posner, MD;  Location: [place];  Service: Vascular;  Laterality: Left;  . BASCILIC VEIN TRANSPOSITION Left 01/03/2015   Procedure: LEFT ARM 2ND STAGE BASCILIC VEIN TRANSPOSITION;  Surgeon: Rosetta Posner, MD;  Location: [place];  Service: Vascular;  Laterality: Left;  . BREAST BIOPSY Left    Patient doesn't remember any information from previous procedure.  Marland Kitchen CAPD REMOVAL N/A 10/31/2019   Procedure: PERITONEAL DIALYSIS  (CAPD) INFECTED CATHETER REMOVAL;  Surgeon: Coralie Keens, MD;  Location: [place];  Service: General;  Laterality: N/A;  . CRANIOTOMY N/A 11/01/2020   Procedure: CRANIOTOMY FOR TUMOR EXCISION;  Surgeon: [name], MD;  Location: [place];  Service: Neurosurgery;  Laterality: N/A;  . FRACTURE SURGERY     left foot,baby toe nad next toe missing  . INSERTION OF DIALYSIS CATHETER Right 10/30/2014   Procedure: INSERTION OF DIALYSIS CATHETER;  Surgeon: Rosetta Posner, MD;  Location: [place];  Service: Vascular;  Laterality: Right;  . KIDNEY TRANSPLANT  11/02/2008   [facility])  . PLACEMENT OF LUMBAR DRAIN N/A 11/01/2020   Procedure: PLACEMENT OF LUMBAR DRAIN;  Surgeon: [name], MD;  Location: [place];  Service: Neurosurgery;  Laterality: N/A;  . WISDOM TOOTH EXTRACTION      Family History  Problem Relation Age of Onset  . Hypertension Mother   . Hypertension Father   . Hypertension Sister   . Hyperlipidemia Sister   . Hypertension Sister   . Diabetes Brother   . Deep vein thrombosis Brother   . Kidney disease Brother  on HD  . Colon cancer Neg Hx   . Esophageal cancer Neg Hx   . Rectal cancer Neg Hx   . Breast cancer Neg Hx     Allergies  Allergen Reactions  . Penicillin G Itching and Rash  . Penicillins Itching and Rash    Did it involve swelling of the face/tongue/throat, SOB, or low BP?Y Did it involve sudden or severe rash/hives, skin peeling, or any reaction on the inside of your mouth or nose? Y Did you need to seek medical attention at a hospital or doctor's office? Y When did it last happen?  2016     If all above answers are "NO", may proceed with cephalosporin use.    CBC Latest Ref Rng & Units 11/10/2020 11/09/2020 11/08/2020  WBC 4.0 - 10.5 K/uL 12.4(H) 9.0 9.3  Hemoglobin 12.0 - 15.0 g/dL 8.3(L) 8.1(L) 8.5(L)  Hematocrit 36.0 - 46.0 % 25.8(L) 24.4(L) 26.5(L)  Platelets 150 - 400 K/uL 159 124(L) 142(L)      CMP     Component Value Date/Time   NA 134 (L) 11/10/2020 0200   NA 142 11/08/2019 0940   K 4.6 11/10/2020 0200   CL 97 (L) 11/10/2020 0200   CO2 28 11/10/2020 0200   GLUCOSE 136 (H) 11/10/2020 0200   BUN 19 11/10/2020 0200   BUN 16 11/08/2019 0940   CREATININE 5.03 (H) 11/10/2020 0200   CREATININE 1.47 (H) 04/14/2013 1149   CALCIUM 7.5 (L) 11/10/2020 0200   CALCIUM 7.2 (L) 01/13/2007 0830   PROT 6.7 10/29/2020 0944   ALBUMIN 2.5 (L) 11/05/2020 0556   AST 30 10/29/2020 0944   ALT 20 10/29/2020 0944   ALKPHOS 256 (H) 10/29/2020 0944   BILITOT 1.2 10/29/2020 0944   GFRNONAA 10 (L) 11/10/2020  0200   GFRNONAA 43 (L) 04/14/2013 1149   GFRAA 9 (L) 11/08/2019 0940   GFRAA 49 (L) 04/14/2013 1149     No results found.     Assessment & Plan:   1. ESRD (end stage renal disease) ([place]) Recommend:  The patient is doing well and currently has adequate dialysis access. The patient's dialysis center is not reporting any access issues. Flow pattern is stable when compared to the prior ultrasound.  The patient should have a duplex ultrasound of the dialysis access in 6 months. The patient will follow-up with me in the office after each ultrasound     2. Essential hypertension Continue antihypertensive medications as already ordered, these medications have been reviewed and there are no changes at this time.   3. Type 2 diabetes mellitus with chronic kidney disease on chronic dialysis, without long-term current use of insulin (HCC) Continue hypoglycemic medications as already ordered, these medications have been reviewed and there are no changes at this time.  Hgb A1C to be monitored as already arranged by primary service    Current Outpatient Medications on File Prior to Visit  Medication Sig Dispense Refill  . amLODipine (NORVASC) 10 MG tablet Take 1 tablet (10 mg total) by mouth daily. 30 tablet 0  . aspirin 81 MG EC tablet Take 81 mg by mouth as needed for pain.    . carvedilol (COREG) 12.5 MG tablet Take 1 tablet (12.5 mg total) by mouth 2 (two) times daily with a meal. 180 tablet 3  . cholecalciferol (VITAMIN D3) 25 MCG (1000 UNIT) tablet Take 1,000 Units by mouth daily.    . cloNIDine (CATAPRES) 0.1 MG tablet Take 1 tablet (0.1 mg total) by mouth  2 (two) times daily. 60 tablet 2  . dexamethasone (DECADRON) 1 MG tablet Take 1 tablet (1 mg total) by mouth every 12 (twelve) hours. Take 1 tablet twice daily then reduce to 1 tablet daily on 11/11/2020 for 3 more days. (Patient not taking: Reported on 04/01/2021) 5 tablet 0  . ibuprofen (ADVIL) 600 MG tablet Take 1 tablet (600 mg  total) by mouth every 8 (eight) hours as needed for moderate pain. 30 tablet 0  . levETIRAcetam (KEPPRA) 250 MG tablet Take 1 tablet (250 mg total) by mouth 2 (two) times daily for 9 days. 18 tablet 0  . lidocaine-prilocaine (EMLA) cream Apply 1 application topically every Monday, Wednesday, and Friday with hemodialysis. (Patient not taking: Reported on 04/01/2021)    . omeprazole (PRILOSEC OTC) 20 MG tablet Take 20 mg by mouth daily as needed for heartburn.    . polyethylene glycol (MIRALAX / GLYCOLAX) 17 g packet Take 17 g by mouth daily. 30 each 0   No current facility-administered medications on file prior to visit.    There are no Patient Instructions on file for this visit. No follow-ups on file.   Kris Hartmann, NP

## 2021-05-22 DIAGNOSIS — Z992 Dependence on renal dialysis: Secondary | ICD-10-CM | POA: Diagnosis not present

## 2021-05-22 DIAGNOSIS — D631 Anemia in chronic kidney disease: Secondary | ICD-10-CM | POA: Diagnosis not present

## 2021-05-22 DIAGNOSIS — N2581 Secondary hyperparathyroidism of renal origin: Secondary | ICD-10-CM | POA: Diagnosis not present

## 2021-05-22 DIAGNOSIS — L299 Pruritus, unspecified: Secondary | ICD-10-CM | POA: Diagnosis not present

## 2021-05-22 DIAGNOSIS — Z23 Encounter for immunization: Secondary | ICD-10-CM | POA: Diagnosis not present

## 2021-05-22 DIAGNOSIS — N186 End stage renal disease: Secondary | ICD-10-CM | POA: Diagnosis not present

## 2021-05-22 DIAGNOSIS — E1129 Type 2 diabetes mellitus with other diabetic kidney complication: Secondary | ICD-10-CM | POA: Diagnosis not present

## 2021-05-22 DIAGNOSIS — D689 Coagulation defect, unspecified: Secondary | ICD-10-CM | POA: Diagnosis not present

## 2021-05-23 ENCOUNTER — Other Ambulatory Visit: Payer: Self-pay

## 2021-05-23 ENCOUNTER — Ambulatory Visit (INDEPENDENT_AMBULATORY_CARE_PROVIDER_SITE_OTHER): Payer: Medicare Other | Admitting: Internal Medicine

## 2021-05-23 ENCOUNTER — Encounter: Payer: Self-pay | Admitting: Internal Medicine

## 2021-05-23 VITALS — BP 150/81 | HR 96 | Temp 98.8°F | Resp 28 | Ht 67.0 in | Wt 164.3 lb

## 2021-05-23 DIAGNOSIS — I10 Essential (primary) hypertension: Secondary | ICD-10-CM

## 2021-05-23 DIAGNOSIS — N186 End stage renal disease: Secondary | ICD-10-CM

## 2021-05-23 DIAGNOSIS — M130 Polyarthritis, unspecified: Secondary | ICD-10-CM | POA: Diagnosis not present

## 2021-05-23 DIAGNOSIS — R52 Pain, unspecified: Secondary | ICD-10-CM | POA: Diagnosis not present

## 2021-05-23 DIAGNOSIS — Z992 Dependence on renal dialysis: Secondary | ICD-10-CM | POA: Diagnosis not present

## 2021-05-23 MED ORDER — AMLODIPINE BESYLATE 10 MG PO TABS: 10.0000 mg | ORAL_TABLET | Freq: Every day | ORAL | 0 refills | Status: DC

## 2021-05-23 NOTE — Patient Instructions (Addendum)
[name],  It was a pleasure seeing you in clinic. Today we discussed:   Diffuse join pain and weakness: We are obtaining some labs at this visit. I will call you with any abnormal results.  Hypertension: Amlodipine refilled. Please take all medications as prescribed. Keep a BP log at home. Follow up in 2 weeks.   If you have any questions or concerns, please call our clinic at [phone] between 9am-5pm and after hours call [phone] and ask for the internal medicine resident on call. If you feel you are having a medical emergency please call 911.   Thank you, we look forward to helping you remain healthy!

## 2021-05-23 NOTE — Progress Notes (Signed)
   CC: back/neck pain, medication refill   HPI:  [name] is a 55 y.o. female with PMHx as stated below presenting for evaluation of back and neck pain. Notes muscle and joint pain in back, neck, arms and thighs for several months. Denies any weakness. Please see problem based charting for complete assessment and plan.  Past Medical History:  Diagnosis Date   Anemia of chronic disease    Arthritis    Deceased-donor kidney transplant    Performed at [facility], April 2010.  Initial ESRD due to HTN nephropathy   Eczema    ESRD (end stage renal disease) (HCC)    s/p transplant creatinine baseline 1.1  M/W/F dialysis   FUO (fever of unknown origin) 05/17/2015   GERD (gastroesophageal reflux disease)    Headache(784.0)    History of hyperparathyroidism    Hypertension    Peritonitis ([place]) 10/2019   Shortness of breath    Wears glasses    Review of Systems:  Negative except as stated in HPI.  Physical Exam:  Vitals:   05/23/21 1450  BP: (!) 150/81  Pulse: 96  Resp: (!) 28  Temp: 98.8 F (37.1 C)  TempSrc: Oral  SpO2: 100%  Weight: 164 lb 4.8 oz (74.5 kg)  Height: 5\' 7"  (1.702 m)   Physical Exam  Constitutional: Appears well-developed and well-nourished. No distress.  HENT: Normocephalic and atraumatic, moist mucous membranes Cardiovascular: Normal rate, regular rhythm, S1 and S2 present, no murmurs, rubs, gallops.  Distal pulses intact Respiratory: No respiratory distress, Lungs are clear to auscultation bilaterally. Musculoskeletal: Normal bulk and tone. Strength 5/5 in bilat upper and lower extremities; tenderness of thoracic paraspinal muscles; diffuse muscle tenderness of proximal muscles; no joint swelling, erythema or tenderness appreciated  Neurological: Is alert and oriented x4, no apparent focal deficits noted. Skin: Warm and dry.  No rash, erythema, lesions noted. Psychiatric: Normal mood and affect. Behavior is normal. Judgment and thought content  normal.    Assessment & Plan:   See Encounters Tab for problem based charting.  Patient discussed with Dr.  Philipp Ovens

## 2021-05-24 DIAGNOSIS — E1129 Type 2 diabetes mellitus with other diabetic kidney complication: Secondary | ICD-10-CM | POA: Diagnosis not present

## 2021-05-24 DIAGNOSIS — Z23 Encounter for immunization: Secondary | ICD-10-CM | POA: Diagnosis not present

## 2021-05-24 DIAGNOSIS — Z992 Dependence on renal dialysis: Secondary | ICD-10-CM | POA: Diagnosis not present

## 2021-05-24 DIAGNOSIS — N2581 Secondary hyperparathyroidism of renal origin: Secondary | ICD-10-CM | POA: Diagnosis not present

## 2021-05-24 DIAGNOSIS — D689 Coagulation defect, unspecified: Secondary | ICD-10-CM | POA: Diagnosis not present

## 2021-05-24 DIAGNOSIS — D631 Anemia in chronic kidney disease: Secondary | ICD-10-CM | POA: Diagnosis not present

## 2021-05-24 DIAGNOSIS — L299 Pruritus, unspecified: Secondary | ICD-10-CM | POA: Diagnosis not present

## 2021-05-24 DIAGNOSIS — N186 End stage renal disease: Secondary | ICD-10-CM | POA: Diagnosis not present

## 2021-05-25 LAB — BMP8+ANION GAP
Anion Gap: 22 mmol/L — ABNORMAL HIGH (ref 10.0–18.0)
BUN/Creatinine Ratio: 5 — ABNORMAL LOW (ref 9–23)
BUN: 35 mg/dL — ABNORMAL HIGH (ref 6–24)
CO2: 26 mmol/L (ref 20–29)
Calcium: 9.4 mg/dL (ref 8.7–10.2)
Chloride: 93 mmol/L — ABNORMAL LOW (ref 96–106)
Creatinine, Ser: 6.68 mg/dL — ABNORMAL HIGH (ref 0.57–1.00)
Glucose: 110 mg/dL — ABNORMAL HIGH (ref 70–99)
Potassium: 4 mmol/L (ref 3.5–5.2)
Sodium: 141 mmol/L (ref 134–144)
eGFR: 7 mL/min/{1.73_m2} — ABNORMAL LOW (ref >59)

## 2021-05-25 LAB — CBC
Hematocrit: 28.1 % — ABNORMAL LOW (ref 34.0–46.6)
Hemoglobin: 9.4 g/dL — ABNORMAL LOW (ref 11.1–15.9)
MCH: 31.1 pg (ref 26.6–33.0)
MCHC: 33.5 g/dL (ref 31.5–35.7)
MCV: 93 fL (ref 79–97)
Platelets: 153 10*3/uL (ref 150–450)
RBC: 3.02 x10E6/uL — ABNORMAL LOW (ref 3.77–5.28)
RDW: 16.1 % — ABNORMAL HIGH (ref 11.7–15.4)
WBC: 5.8 10*3/uL (ref 3.4–10.8)

## 2021-05-25 LAB — ANTINUCLEAR ANTIBODIES, IFA: ANA Titer 1: NEGATIVE

## 2021-05-25 LAB — C-REACTIVE PROTEIN: CRP: 13 mg/L — ABNORMAL HIGH (ref 0–10)

## 2021-05-25 LAB — CK: Total CK: 44 U/L (ref 32–182)

## 2021-05-25 LAB — SEDIMENTATION RATE: Sed Rate: 54 mm/hr — ABNORMAL HIGH (ref 0–40)

## 2021-05-25 LAB — HIV ANTIBODY (ROUTINE TESTING W REFLEX): HIV Screen 4th Generation wRfx: NONREACTIVE

## 2021-05-25 LAB — RHEUMATOID FACTOR: Rheumatoid fact SerPl-aCnc: <10 IU/mL (ref <14.0)

## 2021-05-27 DIAGNOSIS — D631 Anemia in chronic kidney disease: Secondary | ICD-10-CM | POA: Diagnosis not present

## 2021-05-27 DIAGNOSIS — E1129 Type 2 diabetes mellitus with other diabetic kidney complication: Secondary | ICD-10-CM | POA: Diagnosis not present

## 2021-05-27 DIAGNOSIS — N186 End stage renal disease: Secondary | ICD-10-CM | POA: Diagnosis not present

## 2021-05-27 DIAGNOSIS — Z992 Dependence on renal dialysis: Secondary | ICD-10-CM | POA: Diagnosis not present

## 2021-05-27 DIAGNOSIS — L299 Pruritus, unspecified: Secondary | ICD-10-CM | POA: Diagnosis not present

## 2021-05-27 DIAGNOSIS — D689 Coagulation defect, unspecified: Secondary | ICD-10-CM | POA: Diagnosis not present

## 2021-05-27 DIAGNOSIS — N2581 Secondary hyperparathyroidism of renal origin: Secondary | ICD-10-CM | POA: Diagnosis not present

## 2021-05-27 DIAGNOSIS — Z23 Encounter for immunization: Secondary | ICD-10-CM | POA: Diagnosis not present

## 2021-05-27 NOTE — Assessment & Plan Note (Signed)
Patient with history of ESRD secondary to hypertension. In 2010, patient underwent renal transplant and was on immunosuppressive therapy. However, had graft intolerance and possible transplant nephrectomy in 2016. Patient is currently on hemodialysis. LUE fistula with palpable thrill without evidence of infection.  Plan; Continue HD per nephrology schedule Ongoing evaluation for transplant

## 2021-05-27 NOTE — Assessment & Plan Note (Addendum)
BP Readings from Last 3 Encounters:  05/23/21 (!) 150/81  05/14/21 (!) 171/93  04/01/21 (!) 167/94   Patient with history of hypertension on coreg 12.5mg  bid, clonidine 0.1mg  daily and amlodipine 10mg  daily. She is on dialysis and antihypertensives are managed by nephrology. Requesting refill on amlodipine. She does note having hypertension at dialysis as well and denies any episodes of hypotension. Denies any headaches, lightheadedness/dizziness, vision changes, chest pain, palpitations or focal weakness.   Plan: Continue current regimen - amlodipine 10mg  daily refilled  Can consider increasing carvedilol dosing if persistent hypertension F/u in 2 weeks for BP check

## 2021-05-27 NOTE — Assessment & Plan Note (Signed)
Patient is presenting for evaluation of diffuse muscle and joint pains. She notes neck pain, upper back pain, proximal muscle pain and pain in bilateral knees, bilateral hips and right shoulder for several months. She is unable to clarify the duration of the pain or any alleviating/aggravating factors. She does note ongoing weakness in addition to the pain. Denies any fevers or chills but does endorse occasional night sweats. No significant weight loss. Denies any headaches, vision changes, changes in hair or skin.  On examination, crepitus appreciated at bilateral knees; noted to have palpable tenderness at the lateral aspect of hip joint bilaterally. Shoulder tenderness not reproducible on exam. Pain is not reproducible with movement in knees or hips. Does have some limited ROM beyond 90 degrees in the right shoulder.  Labs with mildly elevated ESR and CRP; however, no significant abnormalities noted on CBC, ANA and RF negative. ESR and CRP could be secondary to chronic inflammation from dialysis-dependence.  Unclear etiology for patient's polyarthralgia at this time.  Plan; Continue with monitoring and supportive care If persistent, can consider imaging and rheumatology referral

## 2021-05-29 DIAGNOSIS — Z23 Encounter for immunization: Secondary | ICD-10-CM | POA: Diagnosis not present

## 2021-05-29 DIAGNOSIS — E1129 Type 2 diabetes mellitus with other diabetic kidney complication: Secondary | ICD-10-CM | POA: Diagnosis not present

## 2021-05-29 DIAGNOSIS — N186 End stage renal disease: Secondary | ICD-10-CM | POA: Diagnosis not present

## 2021-05-29 DIAGNOSIS — N2581 Secondary hyperparathyroidism of renal origin: Secondary | ICD-10-CM | POA: Diagnosis not present

## 2021-05-29 DIAGNOSIS — L299 Pruritus, unspecified: Secondary | ICD-10-CM | POA: Diagnosis not present

## 2021-05-29 DIAGNOSIS — D631 Anemia in chronic kidney disease: Secondary | ICD-10-CM | POA: Diagnosis not present

## 2021-05-29 DIAGNOSIS — D689 Coagulation defect, unspecified: Secondary | ICD-10-CM | POA: Diagnosis not present

## 2021-05-29 DIAGNOSIS — Z992 Dependence on renal dialysis: Secondary | ICD-10-CM | POA: Diagnosis not present

## 2021-05-31 DIAGNOSIS — Z23 Encounter for immunization: Secondary | ICD-10-CM | POA: Diagnosis not present

## 2021-05-31 DIAGNOSIS — L299 Pruritus, unspecified: Secondary | ICD-10-CM | POA: Diagnosis not present

## 2021-05-31 DIAGNOSIS — D689 Coagulation defect, unspecified: Secondary | ICD-10-CM | POA: Diagnosis not present

## 2021-05-31 DIAGNOSIS — N2581 Secondary hyperparathyroidism of renal origin: Secondary | ICD-10-CM | POA: Diagnosis not present

## 2021-05-31 DIAGNOSIS — N186 End stage renal disease: Secondary | ICD-10-CM | POA: Diagnosis not present

## 2021-05-31 DIAGNOSIS — Z992 Dependence on renal dialysis: Secondary | ICD-10-CM | POA: Diagnosis not present

## 2021-05-31 DIAGNOSIS — D631 Anemia in chronic kidney disease: Secondary | ICD-10-CM | POA: Diagnosis not present

## 2021-05-31 DIAGNOSIS — E1129 Type 2 diabetes mellitus with other diabetic kidney complication: Secondary | ICD-10-CM | POA: Diagnosis not present

## 2021-06-03 ENCOUNTER — Other Ambulatory Visit: Payer: Medicare Other

## 2021-06-03 DIAGNOSIS — Z23 Encounter for immunization: Secondary | ICD-10-CM | POA: Diagnosis not present

## 2021-06-03 DIAGNOSIS — I129 Hypertensive chronic kidney disease with stage 1 through stage 4 chronic kidney disease, or unspecified chronic kidney disease: Secondary | ICD-10-CM | POA: Diagnosis not present

## 2021-06-03 DIAGNOSIS — E1129 Type 2 diabetes mellitus with other diabetic kidney complication: Secondary | ICD-10-CM | POA: Diagnosis not present

## 2021-06-03 DIAGNOSIS — Z992 Dependence on renal dialysis: Secondary | ICD-10-CM | POA: Diagnosis not present

## 2021-06-03 DIAGNOSIS — D631 Anemia in chronic kidney disease: Secondary | ICD-10-CM | POA: Diagnosis not present

## 2021-06-03 DIAGNOSIS — R0602 Shortness of breath: Secondary | ICD-10-CM | POA: Diagnosis not present

## 2021-06-03 DIAGNOSIS — L299 Pruritus, unspecified: Secondary | ICD-10-CM | POA: Diagnosis not present

## 2021-06-03 DIAGNOSIS — D689 Coagulation defect, unspecified: Secondary | ICD-10-CM | POA: Diagnosis not present

## 2021-06-03 DIAGNOSIS — N2581 Secondary hyperparathyroidism of renal origin: Secondary | ICD-10-CM | POA: Diagnosis not present

## 2021-06-03 DIAGNOSIS — N186 End stage renal disease: Secondary | ICD-10-CM | POA: Diagnosis not present

## 2021-06-05 DIAGNOSIS — N2581 Secondary hyperparathyroidism of renal origin: Secondary | ICD-10-CM | POA: Diagnosis not present

## 2021-06-05 DIAGNOSIS — Z992 Dependence on renal dialysis: Secondary | ICD-10-CM | POA: Diagnosis not present

## 2021-06-05 DIAGNOSIS — E1129 Type 2 diabetes mellitus with other diabetic kidney complication: Secondary | ICD-10-CM | POA: Diagnosis not present

## 2021-06-05 DIAGNOSIS — D689 Coagulation defect, unspecified: Secondary | ICD-10-CM | POA: Diagnosis not present

## 2021-06-05 DIAGNOSIS — N186 End stage renal disease: Secondary | ICD-10-CM | POA: Diagnosis not present

## 2021-06-05 DIAGNOSIS — R52 Pain, unspecified: Secondary | ICD-10-CM | POA: Diagnosis not present

## 2021-06-05 DIAGNOSIS — L299 Pruritus, unspecified: Secondary | ICD-10-CM | POA: Diagnosis not present

## 2021-06-05 NOTE — Progress Notes (Signed)
Internal Medicine Clinic Attending ° °Case discussed with Dr. Aslam  At the time of the visit.  We reviewed the resident’s history and exam and pertinent patient test results.  I agree with the assessment, diagnosis, and plan of care documented in the resident’s note.  °

## 2021-06-07 DIAGNOSIS — N186 End stage renal disease: Secondary | ICD-10-CM | POA: Diagnosis not present

## 2021-06-07 DIAGNOSIS — D689 Coagulation defect, unspecified: Secondary | ICD-10-CM | POA: Diagnosis not present

## 2021-06-07 DIAGNOSIS — R52 Pain, unspecified: Secondary | ICD-10-CM | POA: Diagnosis not present

## 2021-06-07 DIAGNOSIS — L299 Pruritus, unspecified: Secondary | ICD-10-CM | POA: Diagnosis not present

## 2021-06-07 DIAGNOSIS — N2581 Secondary hyperparathyroidism of renal origin: Secondary | ICD-10-CM | POA: Diagnosis not present

## 2021-06-07 DIAGNOSIS — E1129 Type 2 diabetes mellitus with other diabetic kidney complication: Secondary | ICD-10-CM | POA: Diagnosis not present

## 2021-06-07 DIAGNOSIS — Z992 Dependence on renal dialysis: Secondary | ICD-10-CM | POA: Diagnosis not present

## 2021-06-10 DIAGNOSIS — L299 Pruritus, unspecified: Secondary | ICD-10-CM | POA: Diagnosis not present

## 2021-06-10 DIAGNOSIS — N186 End stage renal disease: Secondary | ICD-10-CM | POA: Diagnosis not present

## 2021-06-10 DIAGNOSIS — Z992 Dependence on renal dialysis: Secondary | ICD-10-CM | POA: Diagnosis not present

## 2021-06-10 DIAGNOSIS — N2581 Secondary hyperparathyroidism of renal origin: Secondary | ICD-10-CM | POA: Diagnosis not present

## 2021-06-10 DIAGNOSIS — D689 Coagulation defect, unspecified: Secondary | ICD-10-CM | POA: Diagnosis not present

## 2021-06-10 DIAGNOSIS — E1129 Type 2 diabetes mellitus with other diabetic kidney complication: Secondary | ICD-10-CM | POA: Diagnosis not present

## 2021-06-10 DIAGNOSIS — R52 Pain, unspecified: Secondary | ICD-10-CM | POA: Diagnosis not present

## 2021-06-11 ENCOUNTER — Other Ambulatory Visit: Payer: Self-pay

## 2021-06-11 ENCOUNTER — Ambulatory Visit (INDEPENDENT_AMBULATORY_CARE_PROVIDER_SITE_OTHER): Payer: Medicare Other | Admitting: Internal Medicine

## 2021-06-11 ENCOUNTER — Ambulatory Visit (HOSPITAL_COMMUNITY)
Admission: RE | Admit: 2021-06-11 | Discharge: 2021-06-11 | Disposition: A | Discharge status: Home / Self Care | Payer: Medicare Other | Source: Ambulatory Visit | Attending: Internal Medicine | Admitting: Internal Medicine

## 2021-06-11 ENCOUNTER — Encounter: Payer: Self-pay | Admitting: Internal Medicine

## 2021-06-11 VITALS — BP 141/86 | HR 83 | Wt 159.9 lb

## 2021-06-11 DIAGNOSIS — D1720 Benign lipomatous neoplasm of skin and subcutaneous tissue of unspecified limb: Secondary | ICD-10-CM

## 2021-06-11 DIAGNOSIS — M1990 Unspecified osteoarthritis, unspecified site: Secondary | ICD-10-CM | POA: Insufficient documentation

## 2021-06-11 DIAGNOSIS — M67911 Unspecified disorder of synovium and tendon, right shoulder: Secondary | ICD-10-CM | POA: Diagnosis not present

## 2021-06-11 DIAGNOSIS — Z0000 Encounter for general adult medical examination without abnormal findings: Secondary | ICD-10-CM | POA: Diagnosis not present

## 2021-06-11 DIAGNOSIS — I10 Essential (primary) hypertension: Secondary | ICD-10-CM | POA: Diagnosis not present

## 2021-06-11 DIAGNOSIS — Z1329 Encounter for screening for other suspected endocrine disorder: Secondary | ICD-10-CM | POA: Diagnosis not present

## 2021-06-11 DIAGNOSIS — M1711 Unilateral primary osteoarthritis, right knee: Secondary | ICD-10-CM | POA: Diagnosis not present

## 2021-06-11 DIAGNOSIS — D1724 Benign lipomatous neoplasm of skin and subcutaneous tissue of left leg: Secondary | ICD-10-CM | POA: Diagnosis not present

## 2021-06-11 DIAGNOSIS — R52 Pain, unspecified: Secondary | ICD-10-CM

## 2021-06-11 DIAGNOSIS — M1712 Unilateral primary osteoarthritis, left knee: Secondary | ICD-10-CM | POA: Diagnosis not present

## 2021-06-11 IMAGING — DX DG KNEE COMPLETE 4+V*L*
4 series · 4 of 4 positions shown · non-contrast
Comparison: None.

CLINICAL DATA: Osteoarthritis.

EXAM:
LEFT KNEE - COMPLETE 4+ VIEW

[knee ap]
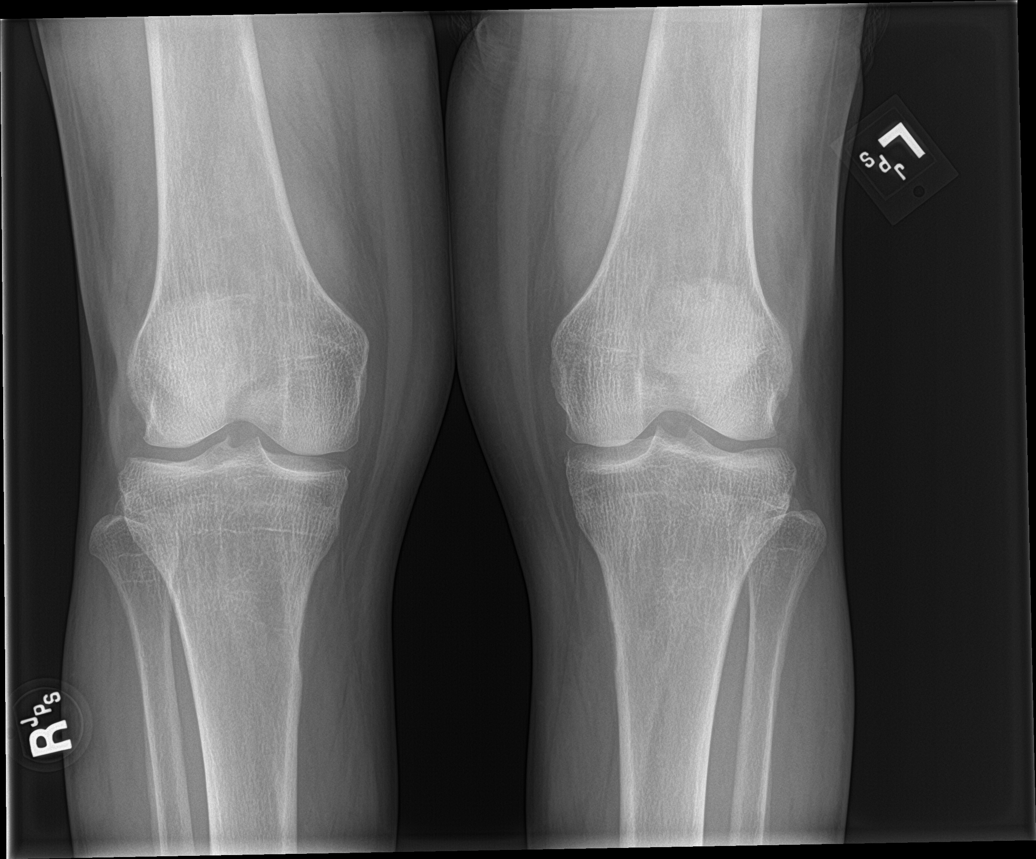

[tunnel]
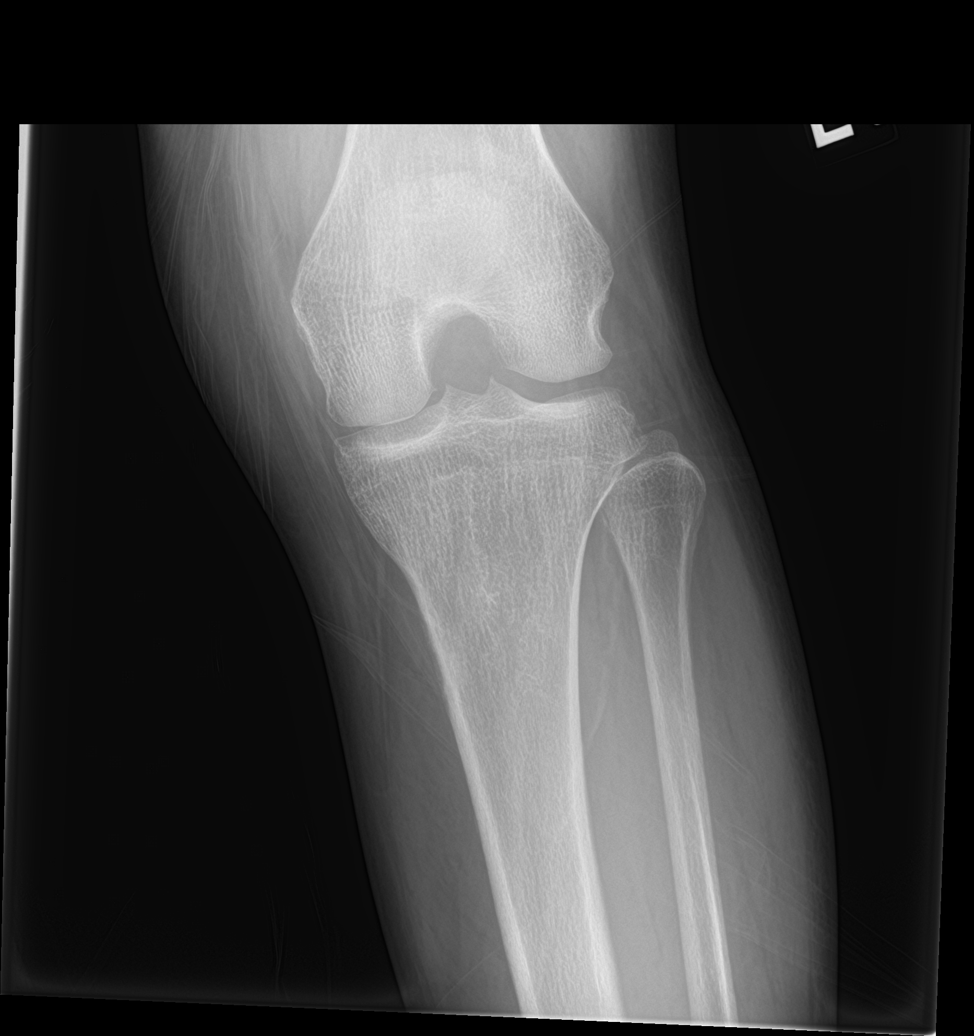

[knee lat]
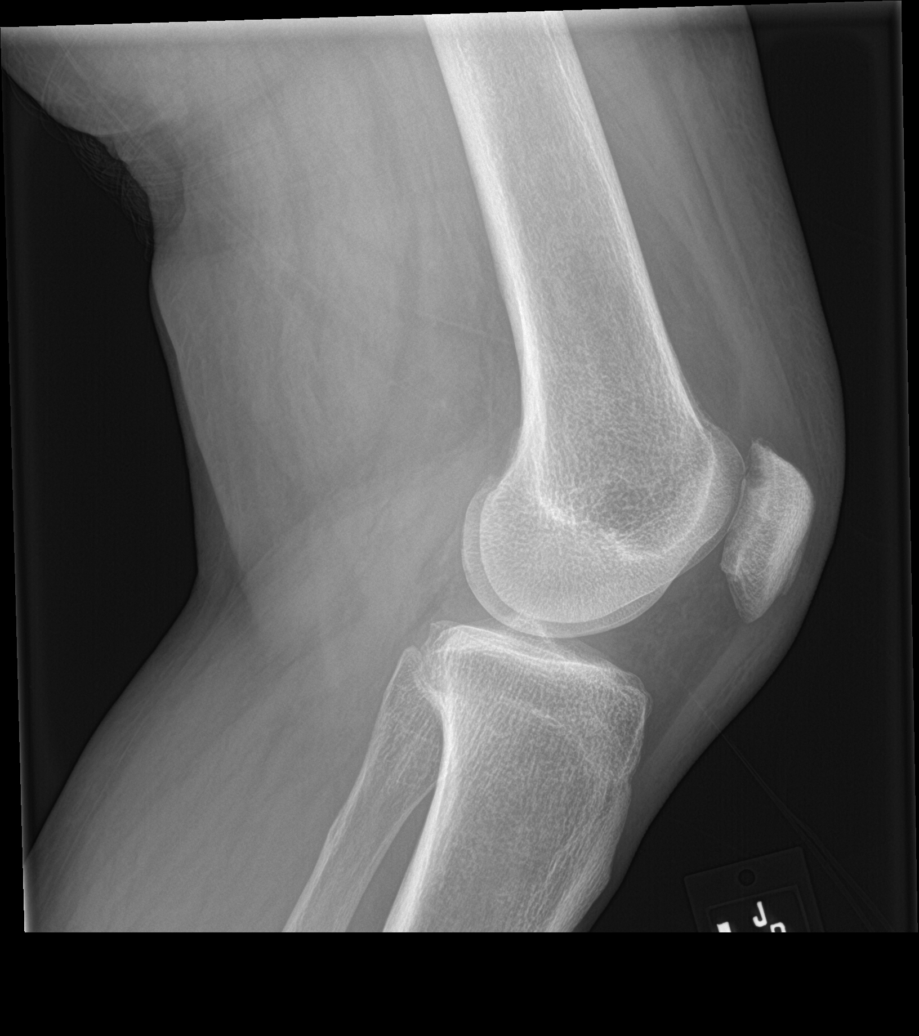

[knee sunrise]
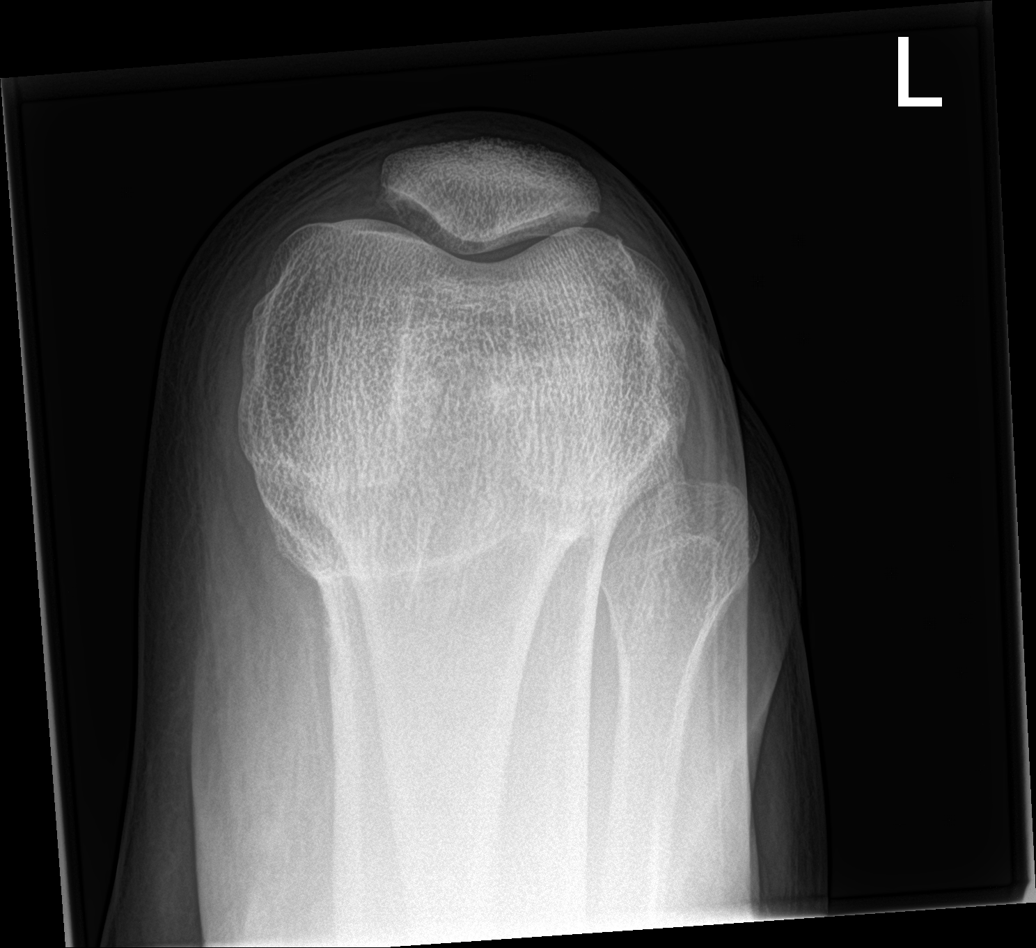

[4 of 4 positions shown; findings below may reference images not displayed]

FINDINGS: No acute fracture or dislocation. There is mild spurring of the
tibial spines. Joint space narrowing and spurring is noted in the
patellofemoral compartment. No joint effusion is seen. The soft
tissues are unremarkable.
IMPRESSION: Mild degenerative changes.

## 2021-06-11 IMAGING — DX DG KNEE COMPLETE 4+V*R*
4 series · 4 of 4 positions shown · non-contrast
Comparison: None.

CLINICAL DATA: Osteoarthritis.

EXAM:
RIGHT KNEE - COMPLETE 4+ VIEW

[tunnel]
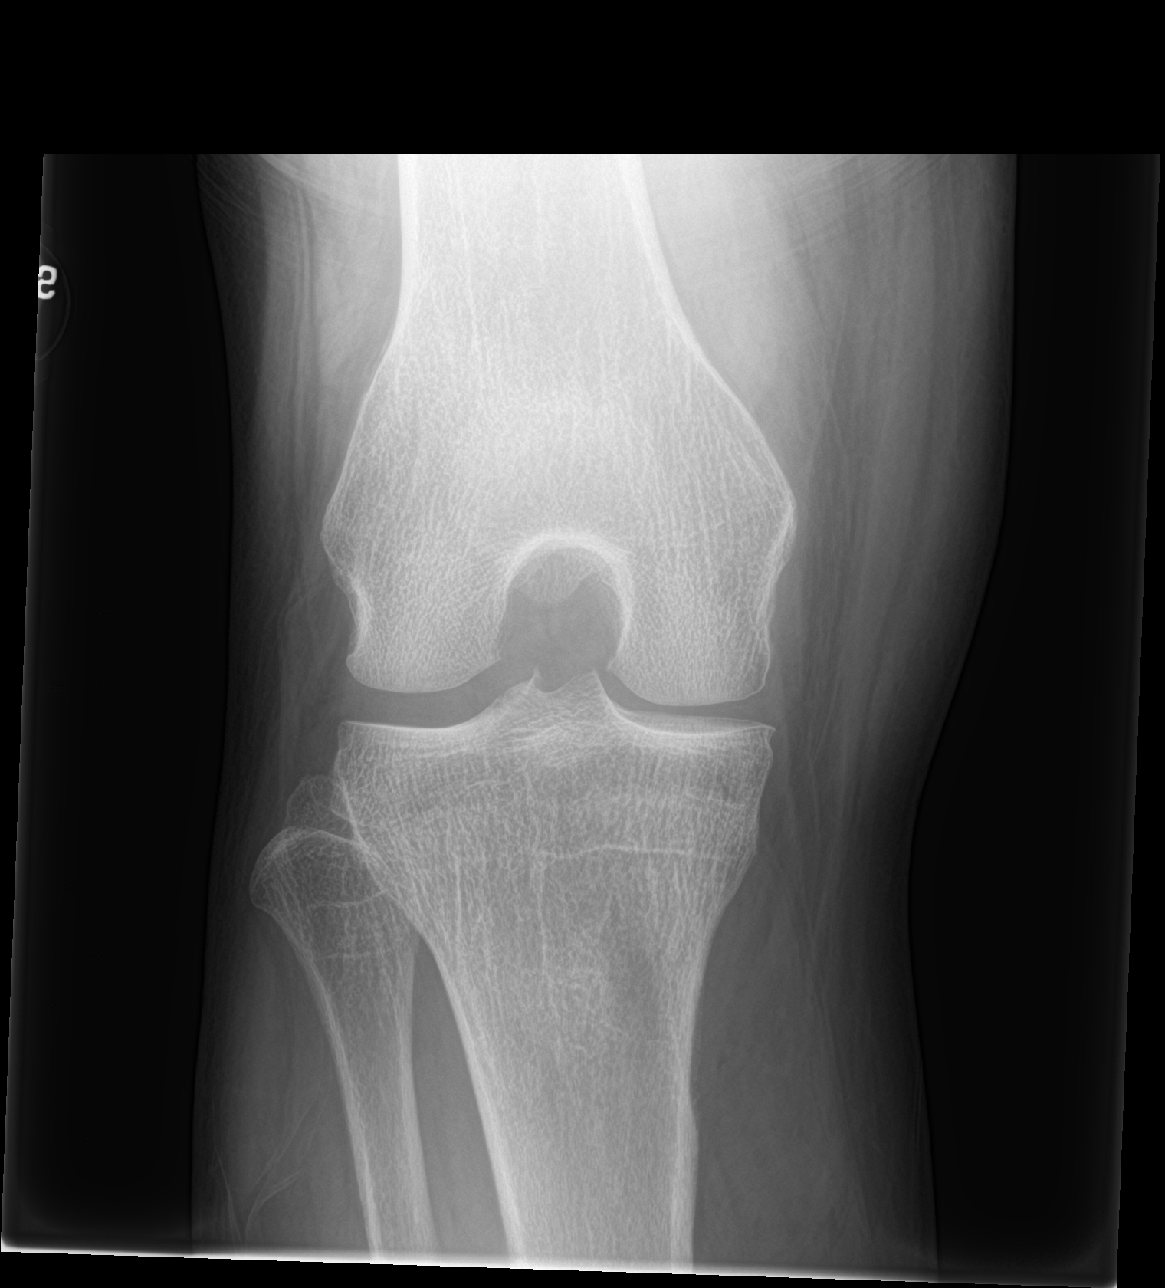

[knee lat]
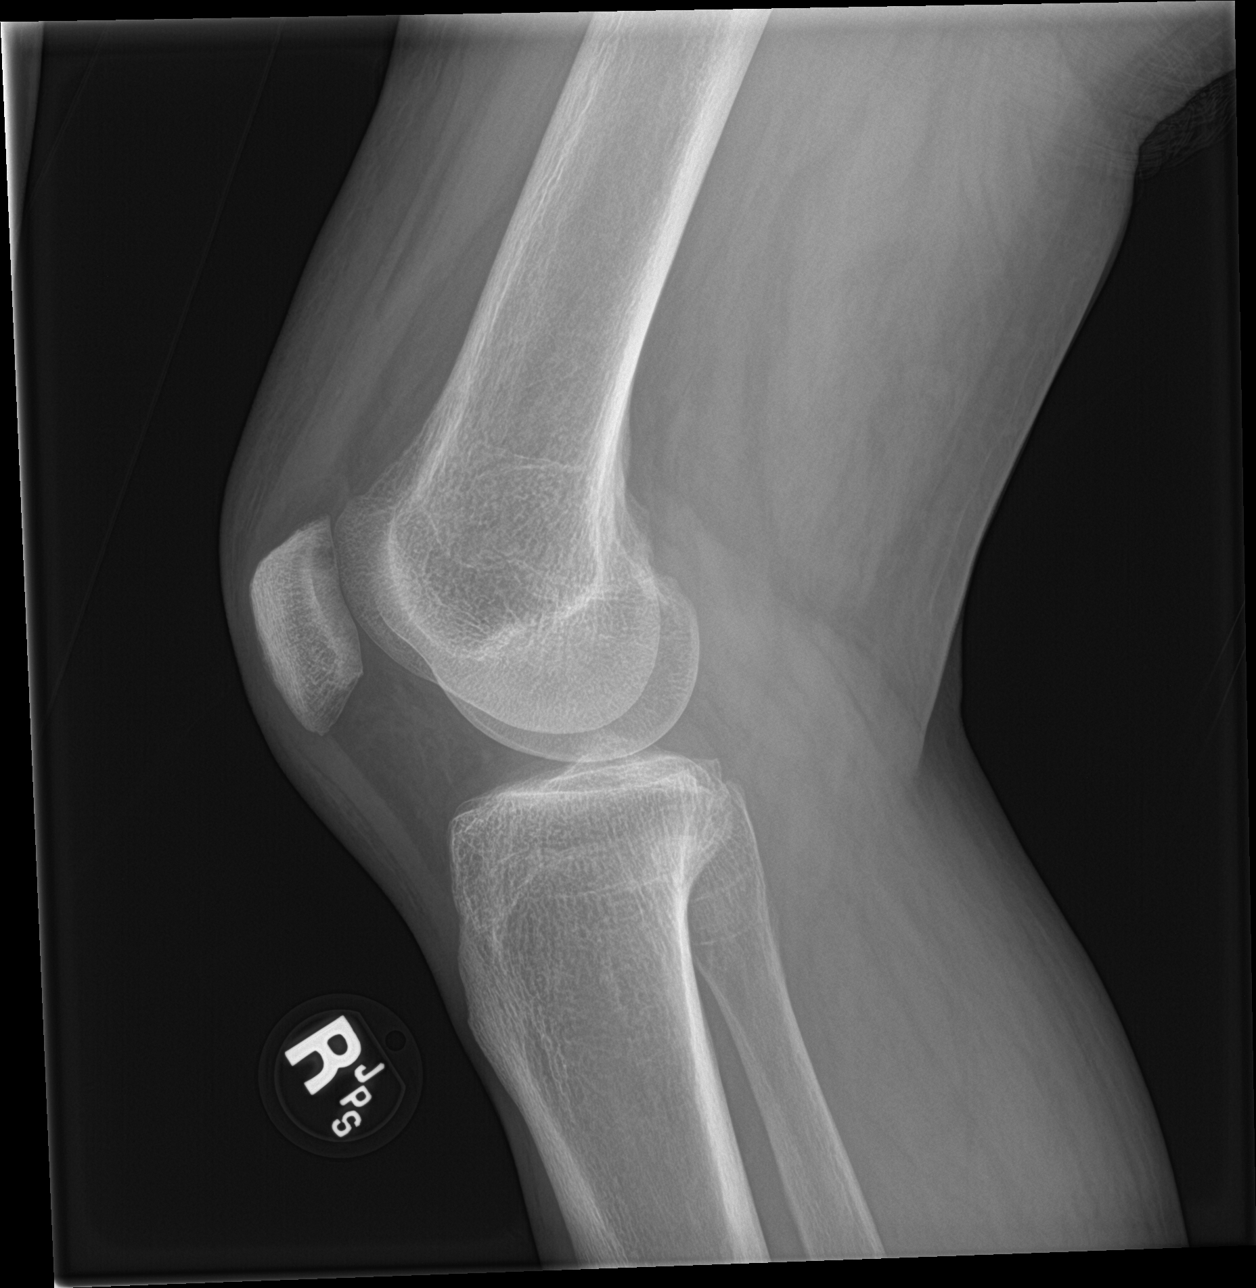

[knee ap]
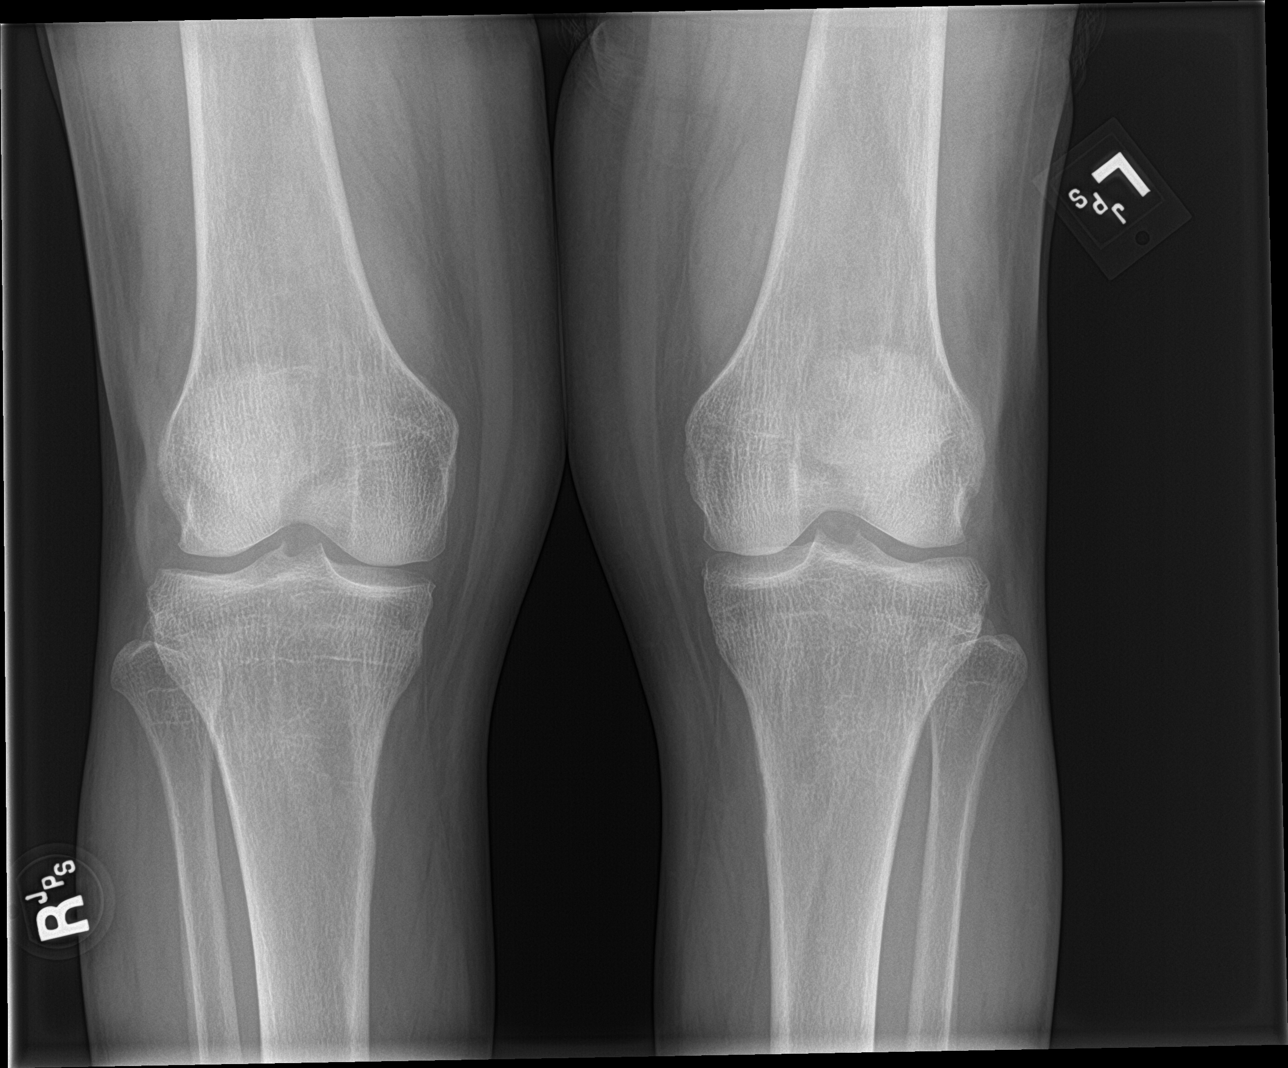

[knee sunrise]
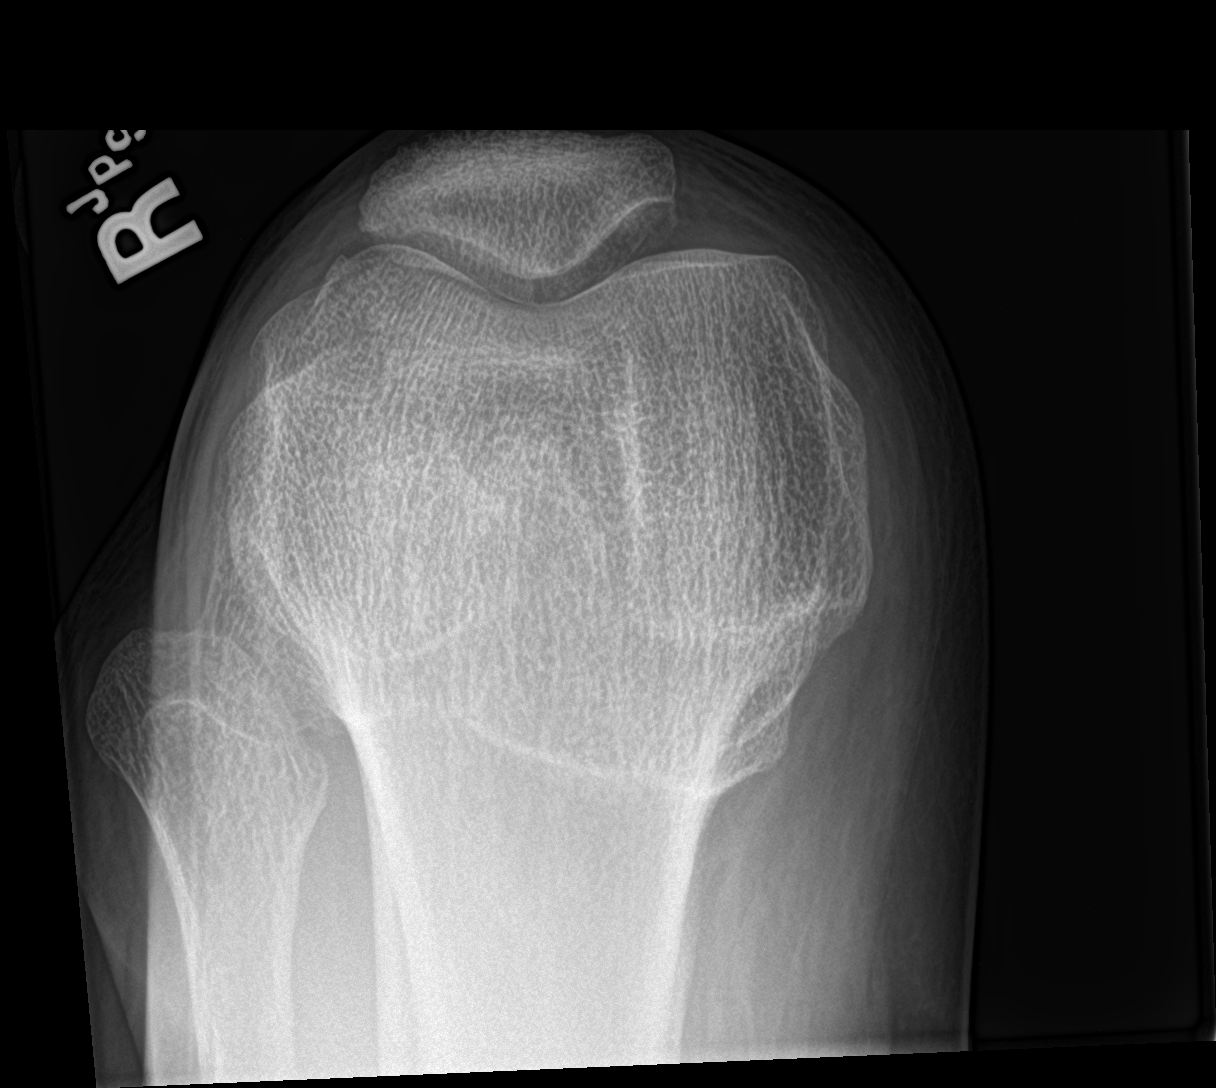

[4 of 4 positions shown; findings below may reference images not displayed]

FINDINGS: No acute fracture or dislocation. Joint space narrowing is noted in
the medial and patellofemoral compartments. There is spurring of the
tibial spines. No joint effusion is seen. The soft tissues are
within normal limits.
IMPRESSION: Mild degenerative changes in the medial and patellofemoral
compartments.

## 2021-06-11 NOTE — Addendum Note (Signed)
Addended byLajean Manes on: 06/11/2021 03:14 PM   Modules accepted: Orders

## 2021-06-11 NOTE — Patient Instructions (Signed)
Thank you, Ms.Tom Ragsdale Foster-Obua for allowing [place] to provide your care today. Today we discussed  Your blood pressure- the numbers at home look much better. We will continue your current medications, and continue to log these numbers  Your lump- this lump has gotten smaller in size, which is good. We will get an ultrasound of this  Mammogram- go to your diagnostic mammogram this month! Very important!  Shoulder pain- we will get an MRI of this and refer you to an orthopedic surgeon   Back and knee pain- we are getting an xray of this to see what may be causing this pain.   I have ordered the following labs for you:  Lab Orders         BMP8+Anion Gap       Referrals ordered today:   Referral Orders         Ambulatory referral to Orthopedic Surgery       I have ordered the following medication/changed the following medications:   Stop the following medications: There are no discontinued medications.   Start the following medications: No orders of the defined types were placed in this encounter.    Follow up: 2 weeks   Should you have any questions or concerns please call the internal medicine clinic at [phone].     Lajean Manes, MD  [facility]

## 2021-06-11 NOTE — Addendum Note (Signed)
Addended byLajean Manes on: 06/11/2021 02:43 PM   Modules accepted: Orders

## 2021-06-11 NOTE — Progress Notes (Signed)
CC: 2 week f/u visit since 10/20 office visit for HTN.  HPI:  Ruth Gutierrez is a 55 y.o. female with a PMHx stated below and presents today for stated above as well as a lump on left hip and joint pain.   Please see problem based assessment and plan for additional details.  Past Medical History:  Diagnosis Date   Anemia of chronic disease    Arthritis    Deceased-donor kidney transplant    Performed at [facility], April 2010.  Initial ESRD due to HTN nephropathy   Eczema    ESRD (end stage renal disease) (HCC)    s/p transplant creatinine baseline 1.1  M/W/F dialysis   FUO (fever of unknown origin) 05/17/2015   GERD (gastroesophageal reflux disease)    Headache(784.0)    History of hyperparathyroidism    Hypertension    Peritonitis ([place]) 10/2019   Shortness of breath    Wears glasses     Current Outpatient Medications on File Prior to Visit  Medication Sig Dispense Refill   amLODipine (NORVASC) 10 MG tablet Take 1 tablet (10 mg total) by mouth daily. 30 tablet 0   aspirin 81 MG EC tablet Take 81 mg by mouth as needed for pain.     carvedilol (COREG) 12.5 MG tablet Take 1 tablet (12.5 mg total) by mouth 2 (two) times daily with a meal. 180 tablet 3   cholecalciferol (VITAMIN D3) 25 MCG (1000 UNIT) tablet Take 1,000 Units by mouth daily.     cloNIDine (CATAPRES) 0.1 MG tablet Take 1 tablet (0.1 mg total) by mouth 2 (two) times daily. 60 tablet 2   dexamethasone (DECADRON) 1 MG tablet Take 1 tablet (1 mg total) by mouth every 12 (twelve) hours. Take 1 tablet twice daily then reduce to 1 tablet daily on 11/11/2020 for 3 more days. (Patient not taking: Reported on 04/01/2021) 5 tablet 0   ibuprofen (ADVIL) 600 MG tablet Take 1 tablet (600 mg total) by mouth every 8 (eight) hours as needed for moderate pain. 30 tablet 0   levETIRAcetam (KEPPRA) 250 MG tablet Take 1 tablet (250 mg total) by mouth 2 (two) times daily for 9 days. 18 tablet 0   lidocaine-prilocaine (EMLA)  cream Apply 1 application topically every Monday, Wednesday, and Friday with hemodialysis. (Patient not taking: Reported on 04/01/2021)     omeprazole (PRILOSEC OTC) 20 MG tablet Take 20 mg by mouth daily as needed for heartburn.     polyethylene glycol (MIRALAX / GLYCOLAX) 17 g packet Take 17 g by mouth daily. 30 each 0   No current facility-administered medications on file prior to visit.    Family History  Problem Relation Age of Onset   Hypertension Mother    Hypertension Father    Hypertension Sister    Hyperlipidemia Sister    Hypertension Sister    Diabetes Brother    Deep vein thrombosis Brother    Kidney disease Brother        on HD   Colon cancer Neg Hx    Esophageal cancer Neg Hx    Rectal cancer Neg Hx    Breast cancer Neg Hx     Social History   Socioeconomic History   Marital status: Single    Spouse name: Not on file   Number of children: 1   Years of education: Not on file   Highest education level: Not on file  Occupational History   Occupation: unemployed  Tobacco Use  Smoking status: Every Day    Packs/day: 0.50    Years: 27.00    Pack years: 13.50    Types: Cigarettes   Smokeless tobacco: Never   Tobacco comments:    10 cigarettes a day  Vaping Use   Vaping Use: Never used  Substance and Sexual Activity   Alcohol use: No    Alcohol/week: 0.0 standard drinks    Comment: occasional drinker noted in the past   Drug use: No   Sexual activity: Yes    Partners: Male    Birth control/protection: None  Other Topics Concern   Not on file  Social History Narrative   Single, 1 daughter   Lives with daughter (born 23) and grandkids   sister helps her with medications etc.   cigarette smoker, rare EtOH, no drugs   Social Determinants of Radio broadcast assistant Strain: Not on file  Food Insecurity: Not on file  Transportation Needs: Not on file  Physical Activity: Not on file  Stress: Not on file  Social Connections: Not on file   Intimate Partner Violence: Not on file    Review of Systems: ROS negative except for what is noted on the assessment and plan.  Vitals:   06/11/21 1027  BP: (!) 141/86  Pulse: 83  Weight: 159 lb 14.4 oz (72.5 kg)     Physical Exam: Constitutional: alert, well-appearing, in NAD HENT: normocephalic, atraumatic, mucous membranes moist, no appreciable lymphadenopathy  Eyes: conjunctiva non-erythematous, EOMI Cardiovascular: RRR, no m/r/g, non-edematous bilateral LE Pulmonary/Chest: normal work of breathing on room air, LCTAB Abdominal: soft, non-tender to palpation, non-distended MSK: reduced bulk and tone. Limited range of motion in bilateral knees limited by pain with diffuse pain to palpation; crepitus present, no overlying erythema. 2-3 cm L gluteus soft , fairly mobile lump on left hip; mildly TTP with no associated erythema or discharge.  Neurological: A&O x 3, 3/5 strength in bilateral lower extremities. 4/5 strength in left upper extremity and 3/5 strength in right upper extremity.  Skin: warm and dry  Assessment & Plan:   See Encounters Tab for problem based charting.  Patient seen with Dr. Lavonda Jumbo, MD  Internal Medicine Resident, PGY-1 [facility]  10:29 AM, 06/11/2021

## 2021-06-11 NOTE — Assessment & Plan Note (Signed)
10/7 screening mammogram with possible lymphadenopathy in bilateral breast ; BI-RADS 0. Pt remains asymptomatic, denies feeling any breast lumps and swelling.   - Diagnostic mammogram 11/22 with [place].

## 2021-06-11 NOTE — Assessment & Plan Note (Addendum)
2-3 cm soft , fairly mobile lump on left lateral hip; mildly TTP with no associated erythema or discharge on exam. Pt reports that she noticed it 1 week ago, and that it has gone down in size over the past 3 days. It has not been bothersome or noticeably painful. She denies chills, nausea, and vomiting. Denies trauma to the area, or any recent falls. Given drastic reduction in size within the course of a week is unlikely of a lipoma, will get [place] to further assess.   Plan Follow up on L side soft tissue US

## 2021-06-11 NOTE — Assessment & Plan Note (Addendum)
Vitals:   06/11/21 1027  BP: (!) 141/86  Pulse: 83  SpO2: 100%   BP improved during this visit. BP log with SBP 130's and DBP 80-90's. Reports excellent compliance to coreg 12.5mg  bid, clonidine 0.1mg  daily and amlodipine 10mg  daily. On HD, and antihypertensives being managed by nephrology. Denies any headaches, lightheadedness, dizziness, vision changes, chest pain, palpitations or focal weakness.   Plan: - Continue current antihypertensive regimen - Repeat BMP given anion gap 2 weeks ago - Continue BP monitoring and logging

## 2021-06-11 NOTE — Assessment & Plan Note (Addendum)
No improvement in diffuse muscle and joint pain since 2 weeks ago. Reports ibuprofen helps. Tylenol did not worked for her in the past. Counseled on the importance of limiting NSAID use in ESRD and hx of HTN. Pt states she will try tylenol 1000 tid and ibuprofen as needed. She notes neck pain, upper back pain, proximal muscle pain and pain in bilateral knees, bilateral hips and right shoulder for several months. Suspect OA given pain in knees with weight bearing activity, and inflammatory markers not indicative of inflammatory process. She is unable to clarify the duration of the pain or any alleviating/aggravating factors, chronic in nature. Denies any fevers or chills but does endorse occasional night sweats. On examination, crepitus appreciated at bilateral knees; noted to have palpable tenderness at the lateral aspect knees bilaterally. Shoulder tenderness reproducible on exam today, unlike 2 weeks ago. Limited ROM beyond 90 degrees in the right shoulder, as seen during previous visit, and exam findings concerning for rotator cuff injury.   Plan -Tylenol 1000 mg tid to help decrease ibuprofen reliance in s/o HTN and ESRD. -XR of bilateral knees -XR of lumbar spine -MRI of right shoulder for rotator cuff tear suspicion -Referral for orthopedic surgeon for suspected rotator cuff injury   Addendum: Right knee XR with Mild degenerative changes in the medial and patellofemoral Compartments. Left knee XR with mild degenerative changes.

## 2021-06-12 DIAGNOSIS — E1129 Type 2 diabetes mellitus with other diabetic kidney complication: Secondary | ICD-10-CM | POA: Diagnosis not present

## 2021-06-12 DIAGNOSIS — L299 Pruritus, unspecified: Secondary | ICD-10-CM | POA: Diagnosis not present

## 2021-06-12 DIAGNOSIS — R52 Pain, unspecified: Secondary | ICD-10-CM | POA: Diagnosis not present

## 2021-06-12 DIAGNOSIS — N2581 Secondary hyperparathyroidism of renal origin: Secondary | ICD-10-CM | POA: Diagnosis not present

## 2021-06-12 DIAGNOSIS — Z992 Dependence on renal dialysis: Secondary | ICD-10-CM | POA: Diagnosis not present

## 2021-06-12 DIAGNOSIS — N186 End stage renal disease: Secondary | ICD-10-CM | POA: Diagnosis not present

## 2021-06-12 DIAGNOSIS — D689 Coagulation defect, unspecified: Secondary | ICD-10-CM | POA: Diagnosis not present

## 2021-06-12 LAB — BMP8+ANION GAP
Anion Gap: 23 mmol/L — ABNORMAL HIGH (ref 10.0–18.0)
BUN/Creatinine Ratio: 4 — ABNORMAL LOW (ref 9–23)
BUN: 31 mg/dL — ABNORMAL HIGH (ref 6–24)
CO2: 25 mmol/L (ref 20–29)
Calcium: 10 mg/dL (ref 8.7–10.2)
Chloride: 94 mmol/L — ABNORMAL LOW (ref 96–106)
Creatinine, Ser: 7.5 mg/dL — ABNORMAL HIGH (ref 0.57–1.00)
Glucose: 121 mg/dL — ABNORMAL HIGH (ref 70–99)
Potassium: 4.2 mmol/L (ref 3.5–5.2)
Sodium: 142 mmol/L (ref 134–144)
eGFR: 6 mL/min/{1.73_m2} — ABNORMAL LOW (ref >59)

## 2021-06-13 NOTE — Progress Notes (Signed)
Internal Medicine Clinic Attending  I saw and evaluated the patient.  I personally confirmed the key portions of the history and exam documented by Dr. Posey Pronto and I reviewed pertinent patient test results.  The assessment, diagnosis, and plan were formulated together and I agree with the documentation in the resident's note. Significant pain with active and passive ROM of right shoulder, positive Hawkins and empty can. Considerable weakness with resisted external rotation as well as internal rotation and inability to perform internal rotation lag test. Concern for possible rotator cuff tear, will pursue imaging and refer to orthopedics. If no tear, will discuss referral to PT.

## 2021-06-14 DIAGNOSIS — L299 Pruritus, unspecified: Secondary | ICD-10-CM | POA: Diagnosis not present

## 2021-06-14 DIAGNOSIS — R52 Pain, unspecified: Secondary | ICD-10-CM | POA: Diagnosis not present

## 2021-06-14 DIAGNOSIS — E1129 Type 2 diabetes mellitus with other diabetic kidney complication: Secondary | ICD-10-CM | POA: Diagnosis not present

## 2021-06-14 DIAGNOSIS — D689 Coagulation defect, unspecified: Secondary | ICD-10-CM | POA: Diagnosis not present

## 2021-06-14 DIAGNOSIS — N186 End stage renal disease: Secondary | ICD-10-CM | POA: Diagnosis not present

## 2021-06-14 DIAGNOSIS — N2581 Secondary hyperparathyroidism of renal origin: Secondary | ICD-10-CM | POA: Diagnosis not present

## 2021-06-14 DIAGNOSIS — Z992 Dependence on renal dialysis: Secondary | ICD-10-CM | POA: Diagnosis not present

## 2021-06-17 DIAGNOSIS — D689 Coagulation defect, unspecified: Secondary | ICD-10-CM | POA: Diagnosis not present

## 2021-06-17 DIAGNOSIS — E1129 Type 2 diabetes mellitus with other diabetic kidney complication: Secondary | ICD-10-CM | POA: Diagnosis not present

## 2021-06-17 DIAGNOSIS — N2581 Secondary hyperparathyroidism of renal origin: Secondary | ICD-10-CM | POA: Diagnosis not present

## 2021-06-17 DIAGNOSIS — Z992 Dependence on renal dialysis: Secondary | ICD-10-CM | POA: Diagnosis not present

## 2021-06-17 DIAGNOSIS — N186 End stage renal disease: Secondary | ICD-10-CM | POA: Diagnosis not present

## 2021-06-17 DIAGNOSIS — R52 Pain, unspecified: Secondary | ICD-10-CM | POA: Diagnosis not present

## 2021-06-17 DIAGNOSIS — L299 Pruritus, unspecified: Secondary | ICD-10-CM | POA: Diagnosis not present

## 2021-06-19 DIAGNOSIS — E1129 Type 2 diabetes mellitus with other diabetic kidney complication: Secondary | ICD-10-CM | POA: Diagnosis not present

## 2021-06-19 DIAGNOSIS — N2581 Secondary hyperparathyroidism of renal origin: Secondary | ICD-10-CM | POA: Diagnosis not present

## 2021-06-19 DIAGNOSIS — Z992 Dependence on renal dialysis: Secondary | ICD-10-CM | POA: Diagnosis not present

## 2021-06-19 DIAGNOSIS — L299 Pruritus, unspecified: Secondary | ICD-10-CM | POA: Diagnosis not present

## 2021-06-19 DIAGNOSIS — R52 Pain, unspecified: Secondary | ICD-10-CM | POA: Diagnosis not present

## 2021-06-19 DIAGNOSIS — D689 Coagulation defect, unspecified: Secondary | ICD-10-CM | POA: Diagnosis not present

## 2021-06-19 DIAGNOSIS — N186 End stage renal disease: Secondary | ICD-10-CM | POA: Diagnosis not present

## 2021-06-21 DIAGNOSIS — N186 End stage renal disease: Secondary | ICD-10-CM | POA: Diagnosis not present

## 2021-06-21 DIAGNOSIS — D689 Coagulation defect, unspecified: Secondary | ICD-10-CM | POA: Diagnosis not present

## 2021-06-21 DIAGNOSIS — R52 Pain, unspecified: Secondary | ICD-10-CM | POA: Diagnosis not present

## 2021-06-21 DIAGNOSIS — L299 Pruritus, unspecified: Secondary | ICD-10-CM | POA: Diagnosis not present

## 2021-06-21 DIAGNOSIS — N2581 Secondary hyperparathyroidism of renal origin: Secondary | ICD-10-CM | POA: Diagnosis not present

## 2021-06-21 DIAGNOSIS — E1129 Type 2 diabetes mellitus with other diabetic kidney complication: Secondary | ICD-10-CM | POA: Diagnosis not present

## 2021-06-21 DIAGNOSIS — Z992 Dependence on renal dialysis: Secondary | ICD-10-CM | POA: Diagnosis not present

## 2021-06-24 DIAGNOSIS — E1129 Type 2 diabetes mellitus with other diabetic kidney complication: Secondary | ICD-10-CM | POA: Diagnosis not present

## 2021-06-24 DIAGNOSIS — L299 Pruritus, unspecified: Secondary | ICD-10-CM | POA: Diagnosis not present

## 2021-06-24 DIAGNOSIS — N2581 Secondary hyperparathyroidism of renal origin: Secondary | ICD-10-CM | POA: Diagnosis not present

## 2021-06-24 DIAGNOSIS — N186 End stage renal disease: Secondary | ICD-10-CM | POA: Diagnosis not present

## 2021-06-24 DIAGNOSIS — M25562 Pain in left knee: Secondary | ICD-10-CM | POA: Diagnosis not present

## 2021-06-24 DIAGNOSIS — D689 Coagulation defect, unspecified: Secondary | ICD-10-CM | POA: Diagnosis not present

## 2021-06-24 DIAGNOSIS — R52 Pain, unspecified: Secondary | ICD-10-CM | POA: Diagnosis not present

## 2021-06-24 DIAGNOSIS — M545 Low back pain, unspecified: Secondary | ICD-10-CM | POA: Diagnosis not present

## 2021-06-24 DIAGNOSIS — Z992 Dependence on renal dialysis: Secondary | ICD-10-CM | POA: Diagnosis not present

## 2021-06-24 DIAGNOSIS — M25561 Pain in right knee: Secondary | ICD-10-CM | POA: Diagnosis not present

## 2021-06-25 ENCOUNTER — Ambulatory Visit
Admission: RE | Admit: 2021-06-25 | Discharge: 2021-06-25 | Disposition: A | Discharge status: Home / Self Care | Payer: Medicare Other | Source: Ambulatory Visit | Attending: Internal Medicine | Admitting: Internal Medicine

## 2021-06-25 ENCOUNTER — Encounter: Payer: Medicare Other | Admitting: Internal Medicine

## 2021-06-25 ENCOUNTER — Other Ambulatory Visit: Payer: Self-pay | Admitting: Internal Medicine

## 2021-06-25 DIAGNOSIS — R928 Other abnormal and inconclusive findings on diagnostic imaging of breast: Secondary | ICD-10-CM

## 2021-06-25 DIAGNOSIS — N6489 Other specified disorders of breast: Secondary | ICD-10-CM | POA: Diagnosis not present

## 2021-06-25 DIAGNOSIS — R922 Inconclusive mammogram: Secondary | ICD-10-CM | POA: Diagnosis not present

## 2021-06-25 DIAGNOSIS — R599 Enlarged lymph nodes, unspecified: Secondary | ICD-10-CM

## 2021-06-25 IMAGING — US US AXILLARY RIGHT
1 series · 13 of 14 positions shown · non-contrast
Comparison: Screening mammogram dated [DATE].

CLINICAL DATA: Patient returns today to evaluate a possible LEFT
breast asymmetry and possible bilateral axillary lymphadenopathy.

EXAM:
DIGITAL DIAGNOSTIC UNILATERAL LEFT MAMMOGRAM WITH TOMOSYNTHESIS AND
CAD; US AXILLARY RIGHT; ULTRASOUND LEFT BREAST LIMITED
TECHNIQUE: Left digital diagnostic mammography and breast tomosynthesis was
performed. The images were evaluated with computer-aided detection.;
Targeted ultrasound examination of the right axilla was performed.;
Targeted ultrasound examination of the left breast was performed.

[Series 1: us axillary right · 0.06mm/px · 13 of 14 slices shown]
[im 1/14]
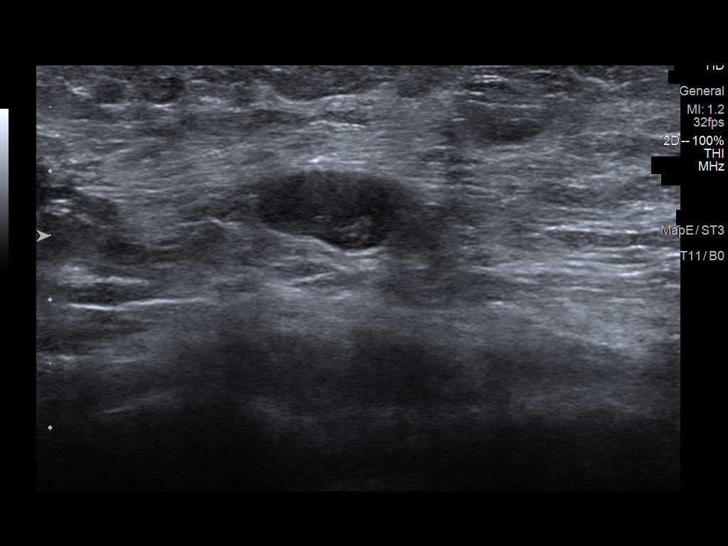
[im 2/14]
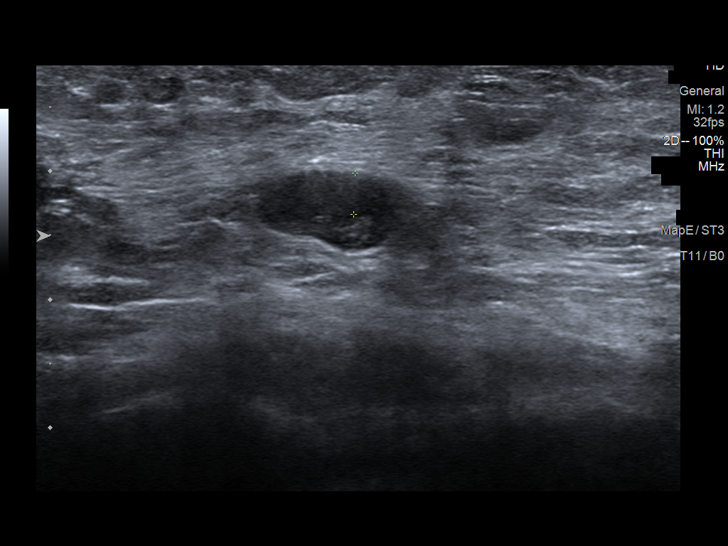
[im 3/14]
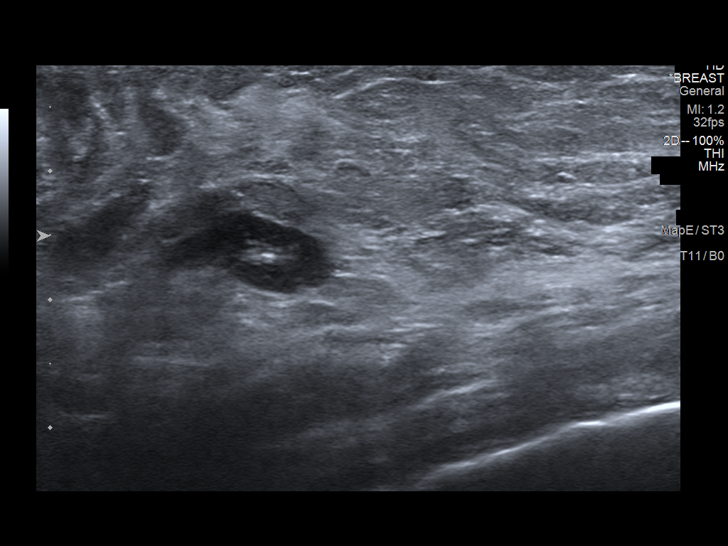
[im 4/14]
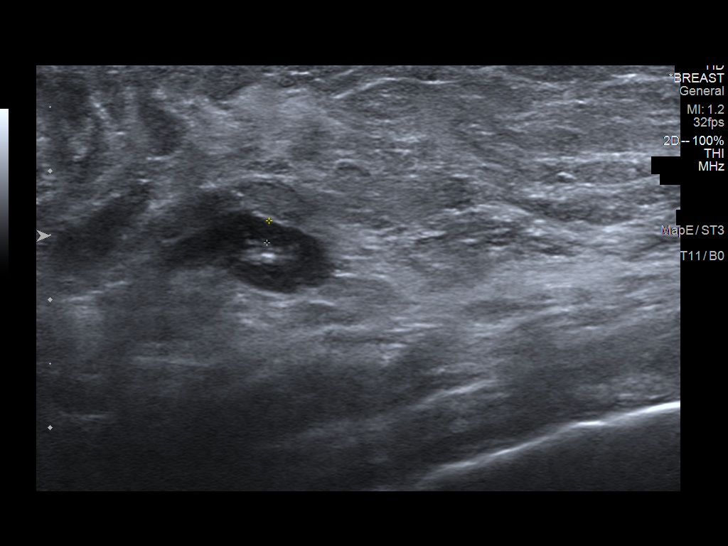
[im 5/14]
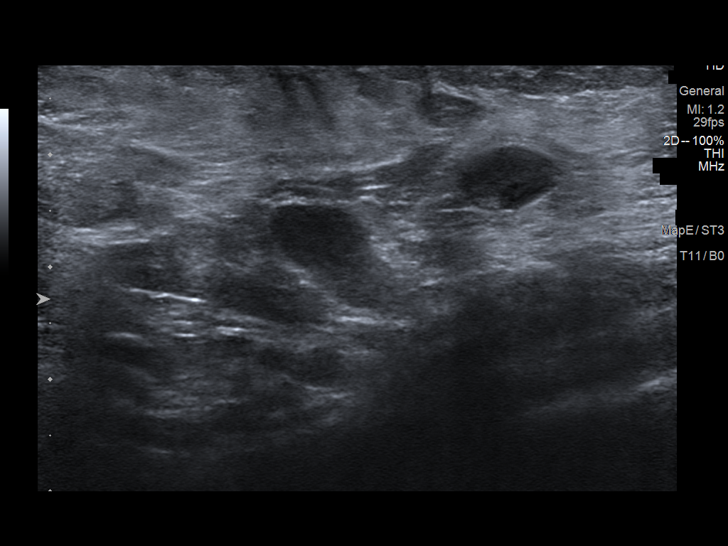
[im 6/14]
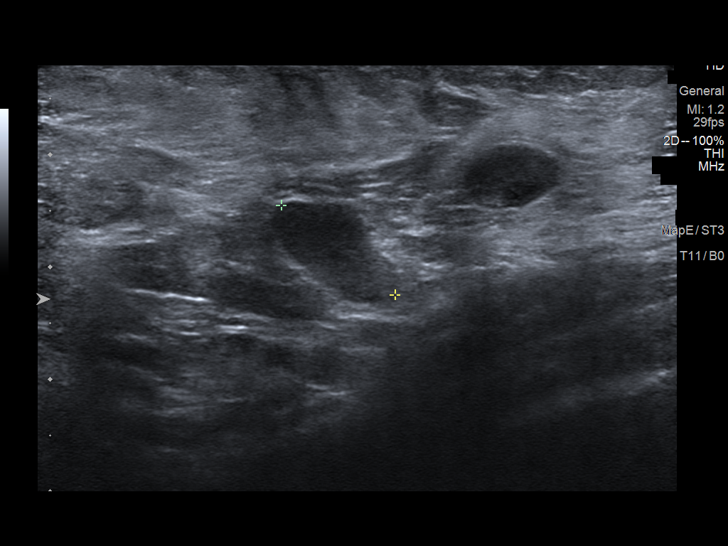
[im 8/14]
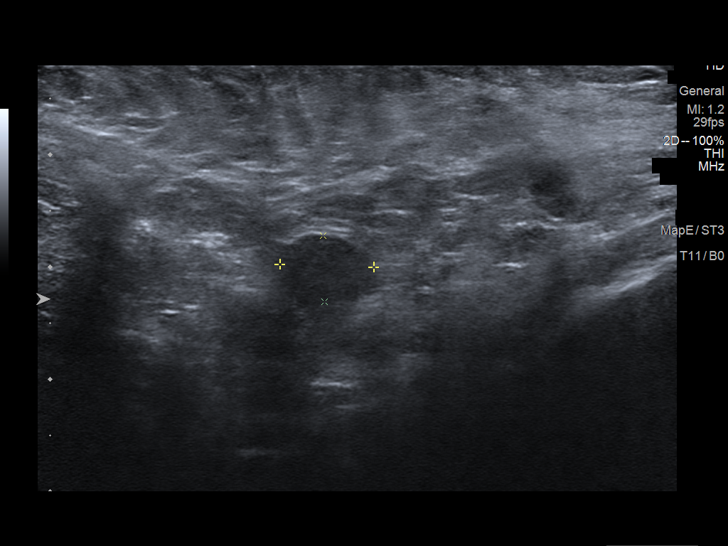
[im 9/14]
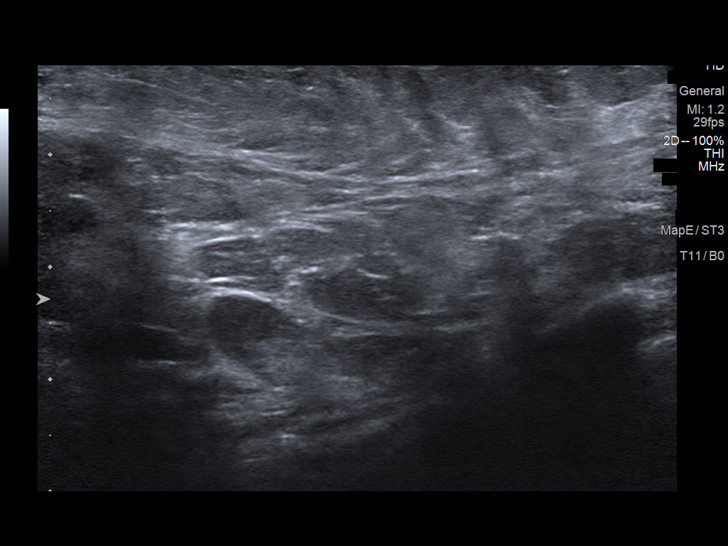
[im 10/14]
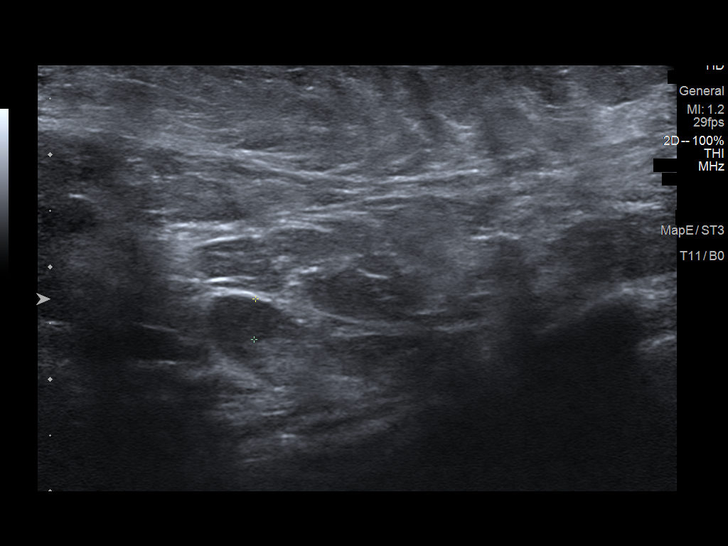
[im 11/14]
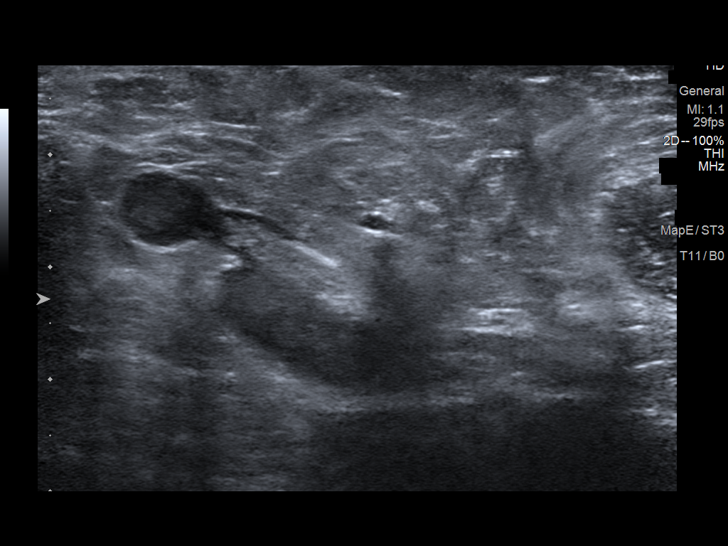
[im 12/14]
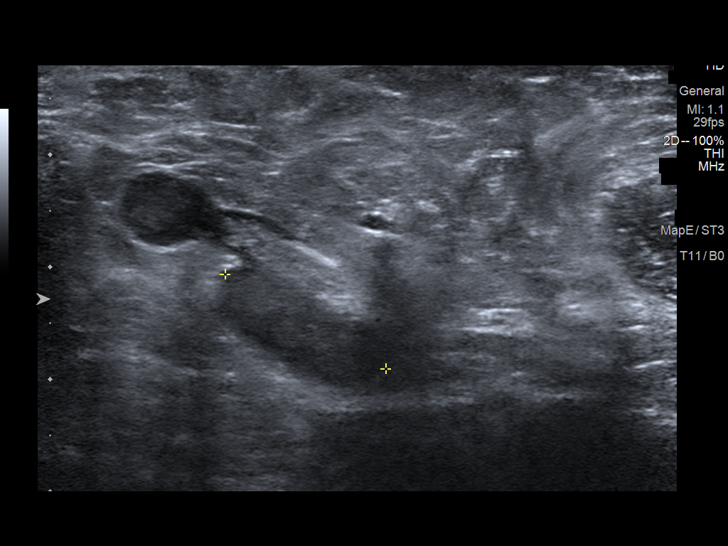
[im 13/14]
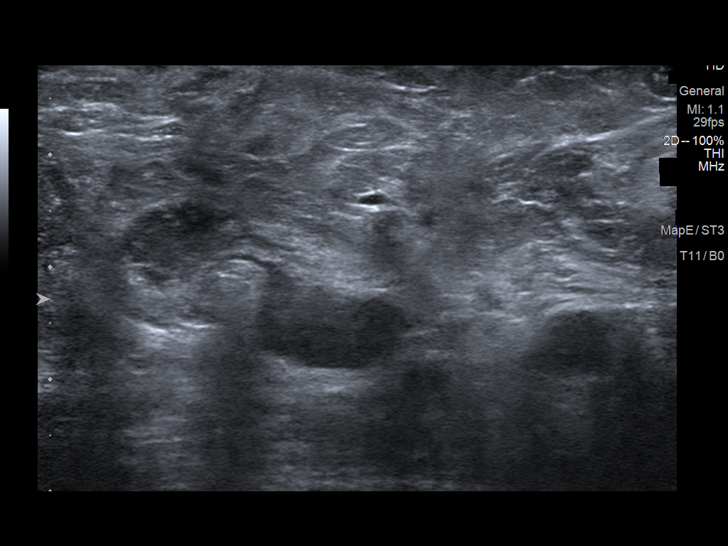
[im 14/14]
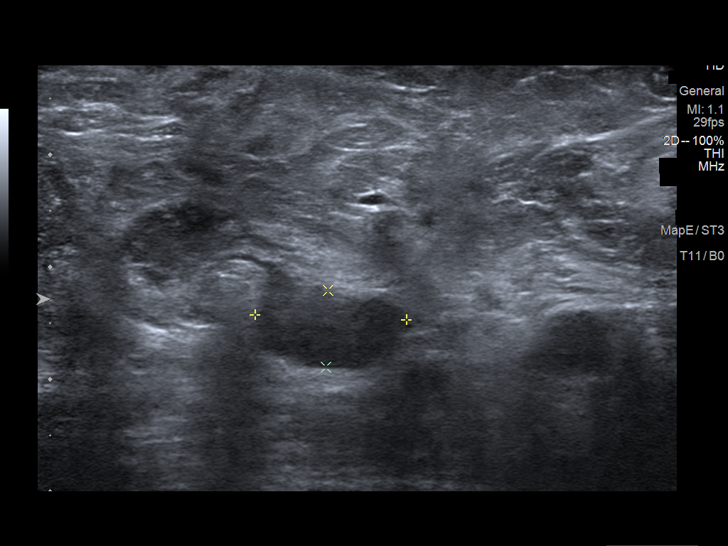

[13 of 14 positions shown; findings below may reference images not displayed]

No earlier
mammograms are available.

ACR Breast Density Category b: There are scattered areas of
fibroglandular density.
FINDINGS: Two biopsy clips are present within the LEFT breast, subareolar
region and lower outer quadrant respectively. The questioned mass
within the lower LEFT breast, 6 o'clock axis region is not
convincingly reproduced on today's additional diagnostic views
suggesting superimposition of normal fibroglandular tissues.
Ultrasound will be performed to ensure benignity.

Targeted ultrasound is performed, evaluating the entire lower LEFT
breast, showing only normal fibroglandular tissues and fat lobules
throughout. No solid or cystic mass.

Targeted ultrasound is performed of the bilateral axillary regions,
showing multiple morphologically abnormal lymph nodes with cortical
thickening of various degrees, greatest on the RIGHT at 4 mm
thickness and greatest on the LEFT measuring 7 mm thickness.
IMPRESSION: 1. Morphologically abnormal lymph nodes within each axilla.
Recommend 2 ultrasound-guided biopsies to include the RIGHT axillary
lymph node with cortical thickness measuring 4 mm and the LEFT
axillary lymph node with eccentric cortical thickening measuring up
to 7 mm.
2. No evidence of malignancy within the LEFT breast.

RECOMMENDATION:
1. Ultrasound-guided biopsies of bilateral axillary lymph nodes, as
detailed above.
2. If ultrasound-guided biopsies are benign and concordant,
recommend routine bilateral annual screening mammogram in 1 year.

I have discussed the findings and recommendations with the patient.
If applicable, a reminder letter will be sent to the patient
regarding the next appointment.

BI-RADS CATEGORY  4: Suspicious.

## 2021-06-25 IMAGING — US US BREAST*L* LIMITED INC AXILLA
1 series · 11 of 11 positions shown · non-contrast
Comparison: Screening mammogram dated [DATE].

CLINICAL DATA: Patient returns today to evaluate a possible LEFT
breast asymmetry and possible bilateral axillary lymphadenopathy.

EXAM:
DIGITAL DIAGNOSTIC UNILATERAL LEFT MAMMOGRAM WITH TOMOSYNTHESIS AND
CAD; US AXILLARY RIGHT; ULTRASOUND LEFT BREAST LIMITED
TECHNIQUE: Left digital diagnostic mammography and breast tomosynthesis was
performed. The images were evaluated with computer-aided detection.;
Targeted ultrasound examination of the right axilla was performed.;
Targeted ultrasound examination of the left breast was performed.

[Series 1: us breast*left* limited inc axilla · 0.04mm/px · 11 of 11 slices shown]
[im 1/11]
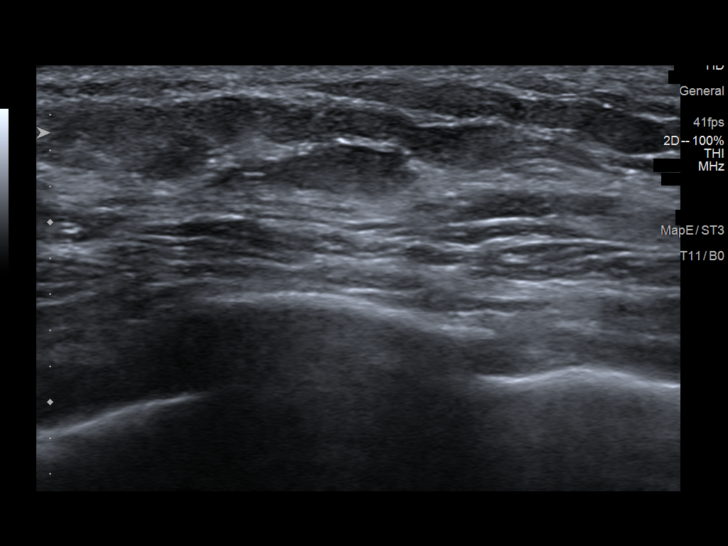
[im 2/11]
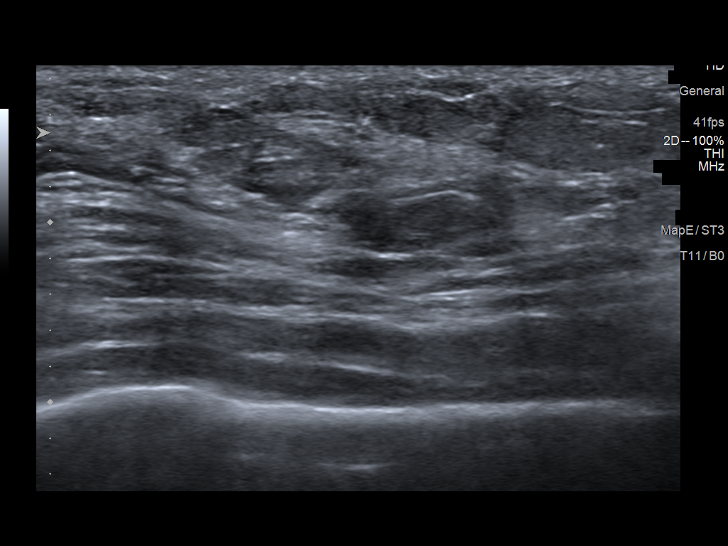
[im 3/11]
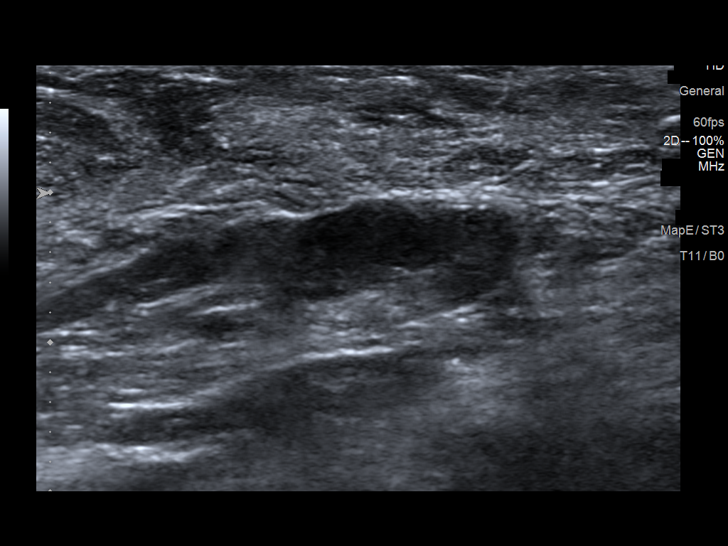
[im 4/11]
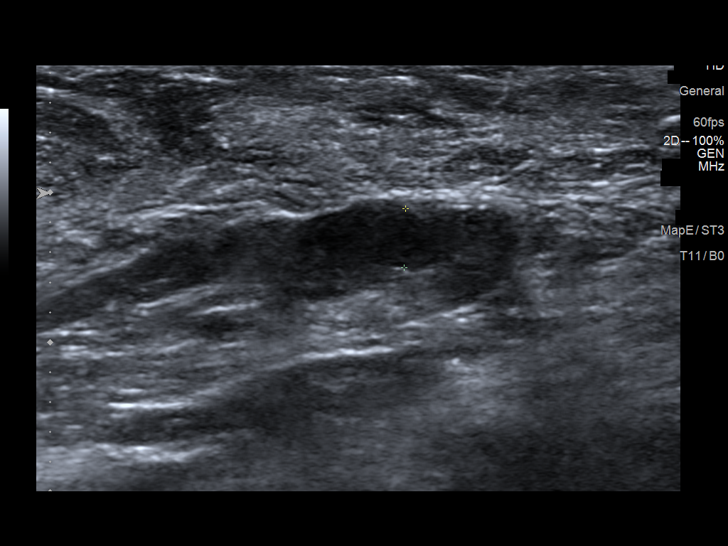
[im 5/11]
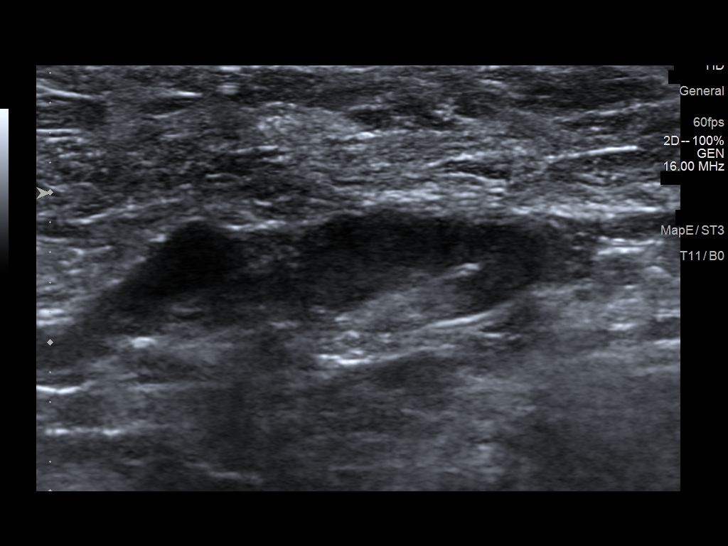
[im 6/11]
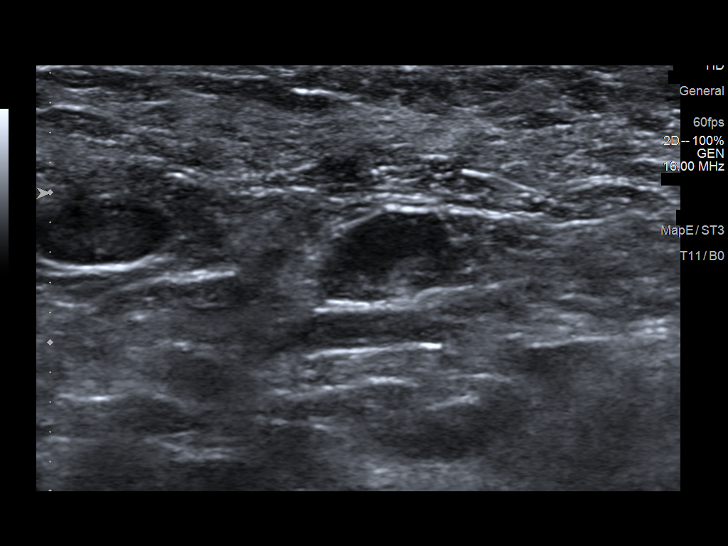
[im 7/11]
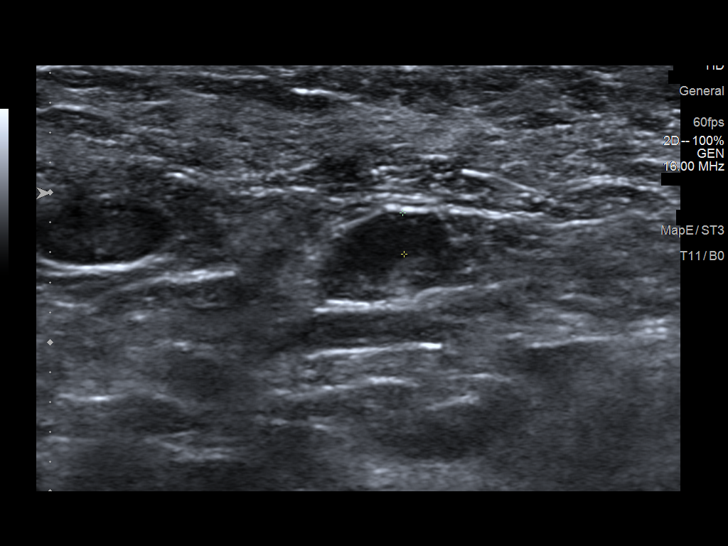
[im 8/11]
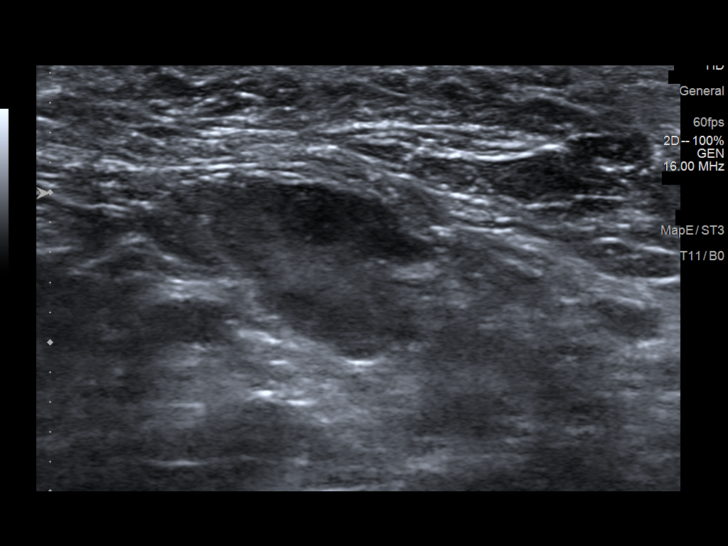
[im 9/11]
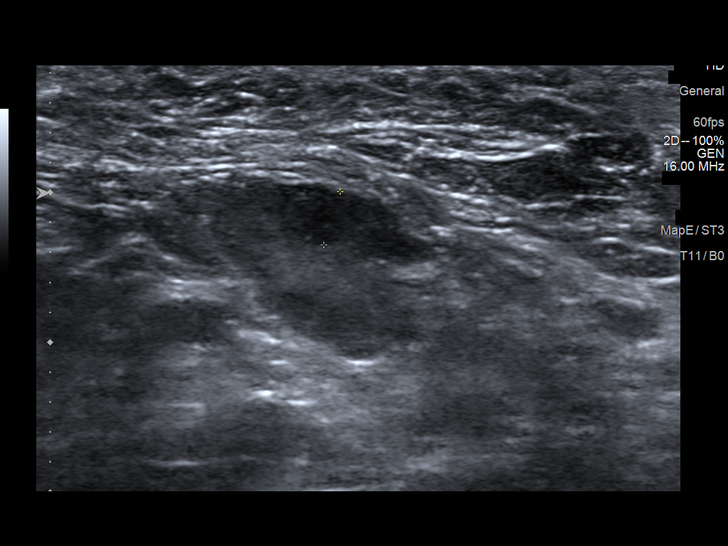
[im 10/11]
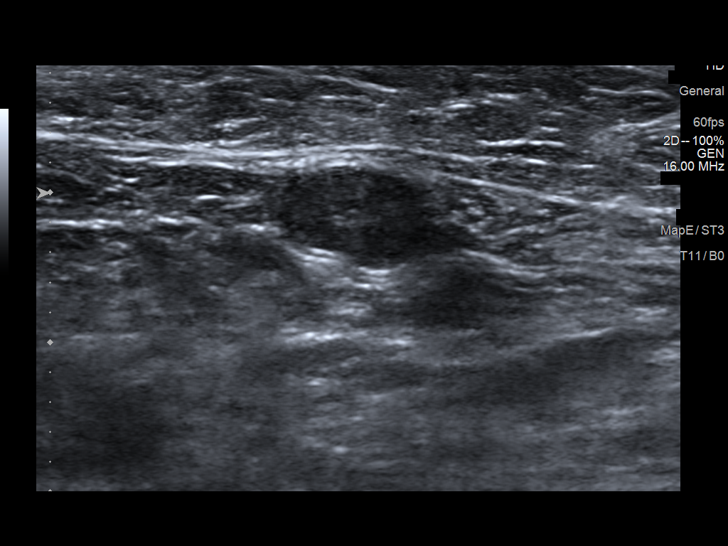
[im 11/11]
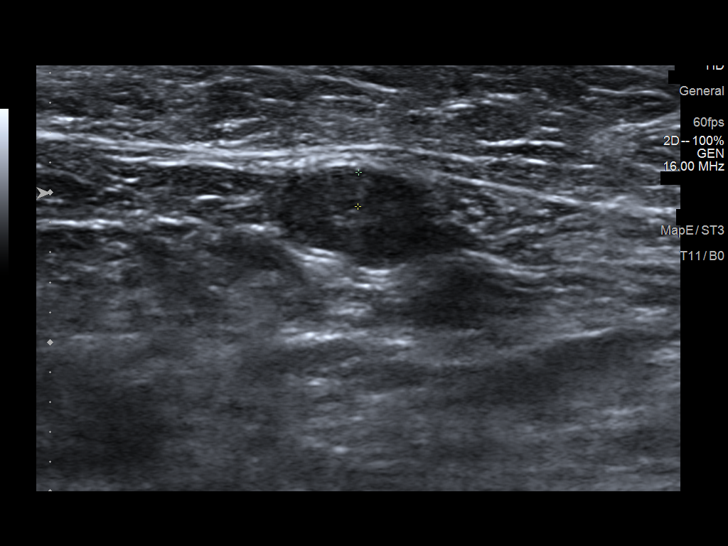

[11 of 11 positions shown; findings below may reference images not displayed]

No earlier
mammograms are available.

ACR Breast Density Category b: There are scattered areas of
fibroglandular density.
FINDINGS: Two biopsy clips are present within the LEFT breast, subareolar
region and lower outer quadrant respectively. The questioned mass
within the lower LEFT breast, 6 o'clock axis region is not
convincingly reproduced on today's additional diagnostic views
suggesting superimposition of normal fibroglandular tissues.
Ultrasound will be performed to ensure benignity.

Targeted ultrasound is performed, evaluating the entire lower LEFT
breast, showing only normal fibroglandular tissues and fat lobules
throughout. No solid or cystic mass.

Targeted ultrasound is performed of the bilateral axillary regions,
showing multiple morphologically abnormal lymph nodes with cortical
thickening of various degrees, greatest on the RIGHT at 4 mm
thickness and greatest on the LEFT measuring 7 mm thickness.
IMPRESSION: 1. Morphologically abnormal lymph nodes within each axilla.
Recommend 2 ultrasound-guided biopsies to include the RIGHT axillary
lymph node with cortical thickness measuring 4 mm and the LEFT
axillary lymph node with eccentric cortical thickening measuring up
to 7 mm.
2. No evidence of malignancy within the LEFT breast.

RECOMMENDATION:
1. Ultrasound-guided biopsies of bilateral axillary lymph nodes, as
detailed above.
2. If ultrasound-guided biopsies are benign and concordant,
recommend routine bilateral annual screening mammogram in 1 year.

I have discussed the findings and recommendations with the patient.
If applicable, a reminder letter will be sent to the patient
regarding the next appointment.

BI-RADS CATEGORY  4: Suspicious.

## 2021-06-25 IMAGING — MG MM DIGITAL DIAGNOSTIC UNILAT*L* W/ TOMO W/ CAD
4 series · 4 of 12 positions shown · non-contrast
Comparison: Screening mammogram dated [DATE].

CLINICAL DATA: Patient returns today to evaluate a possible LEFT
breast asymmetry and possible bilateral axillary lymphadenopathy.

EXAM:
DIGITAL DIAGNOSTIC UNILATERAL LEFT MAMMOGRAM WITH TOMOSYNTHESIS AND
CAD; US AXILLARY RIGHT; ULTRASOUND LEFT BREAST LIMITED
TECHNIQUE: Left digital diagnostic mammography and breast tomosynthesis was
performed. The images were evaluated with computer-aided detection.;
Targeted ultrasound examination of the right axilla was performed.;
Targeted ultrasound examination of the left breast was performed.

[L MLO synth-2D]
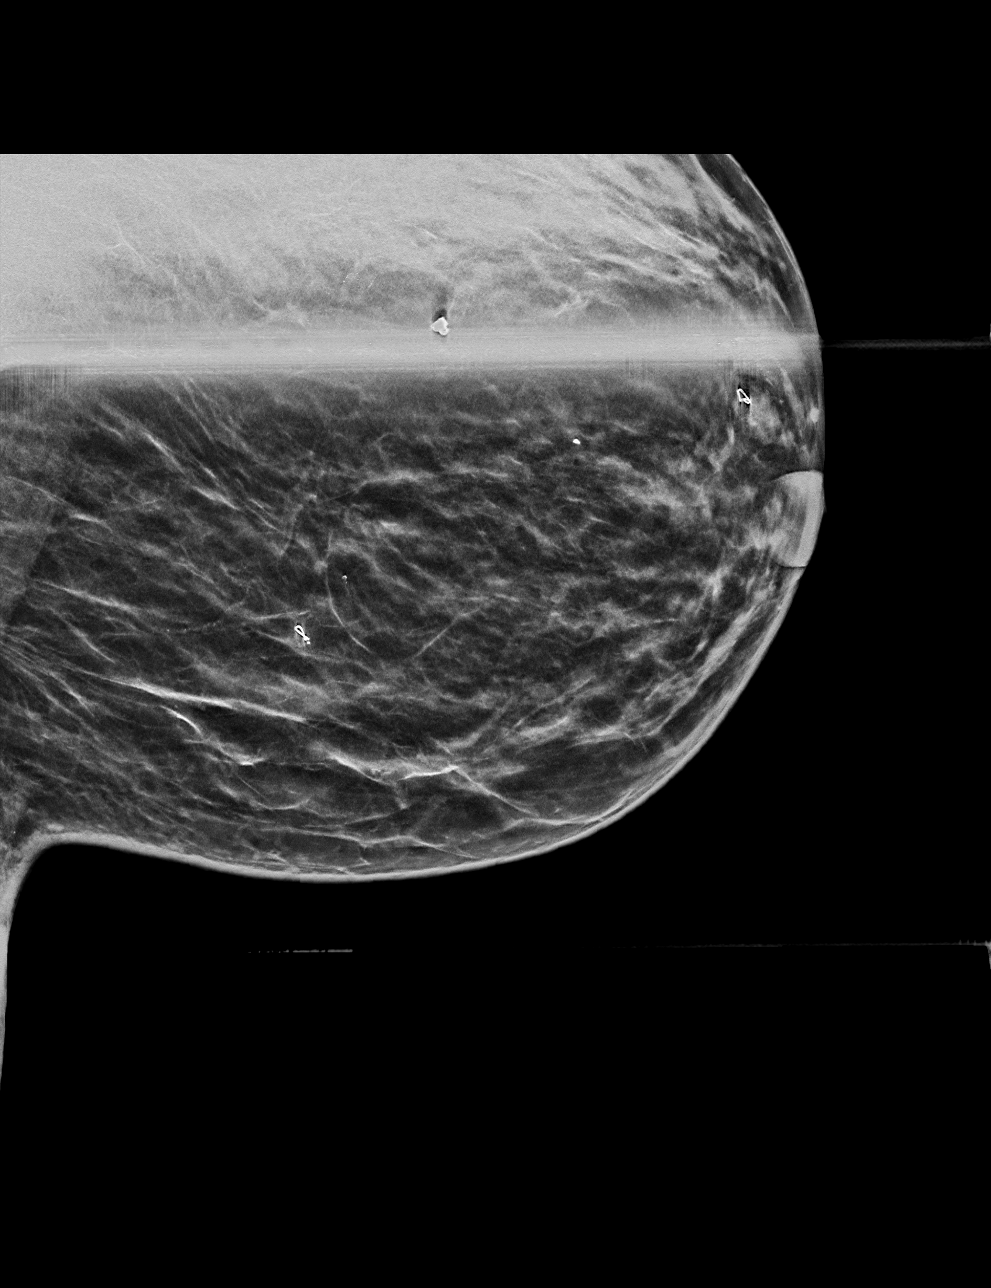

[L ML synth-2D]
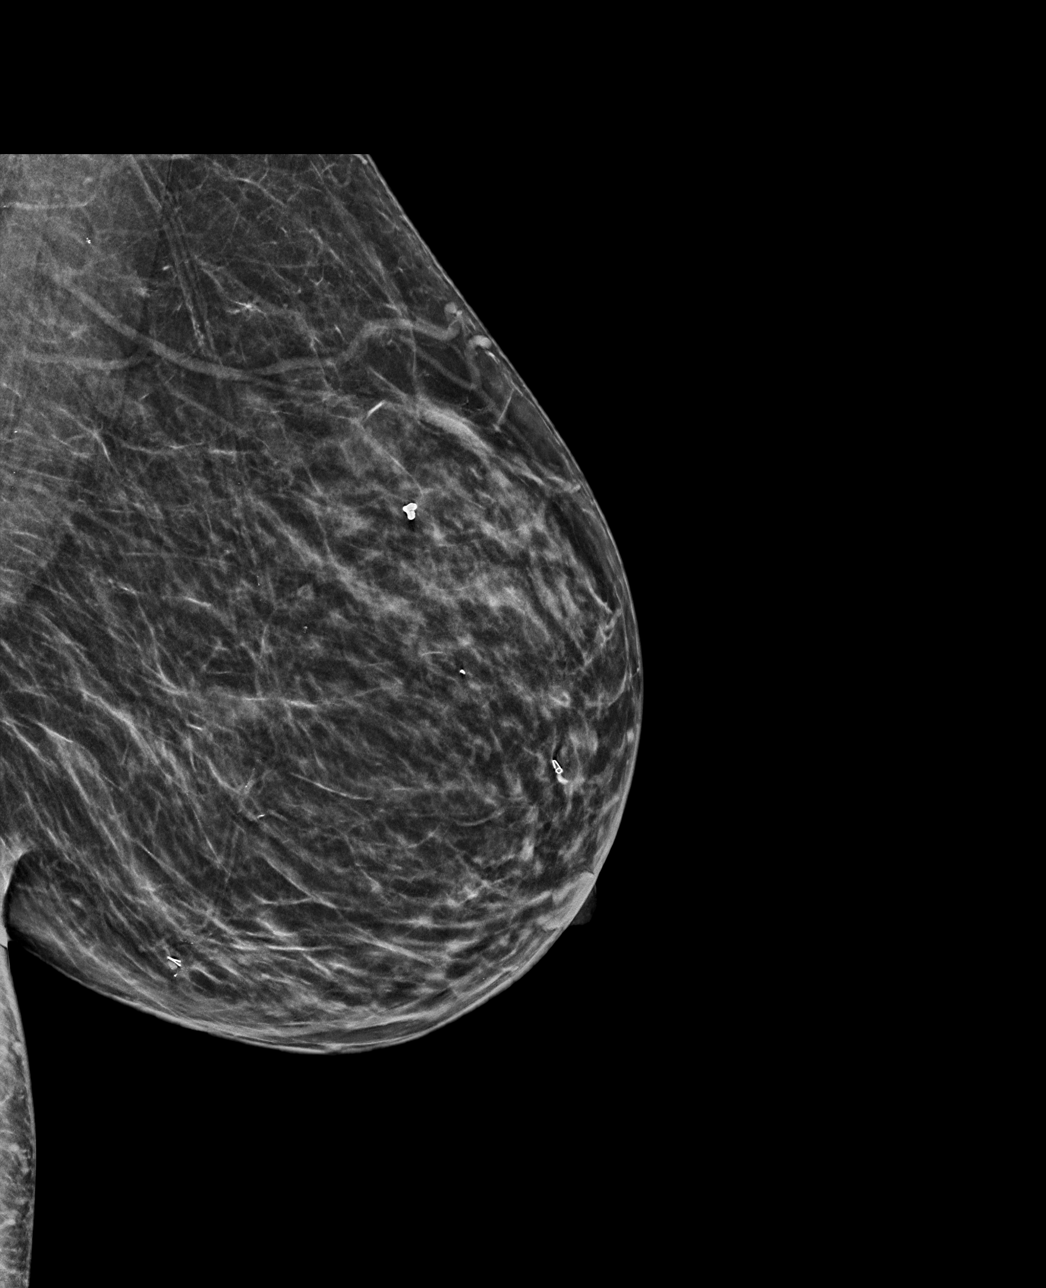

[L ML tomo · tomo slice 27/54.0]
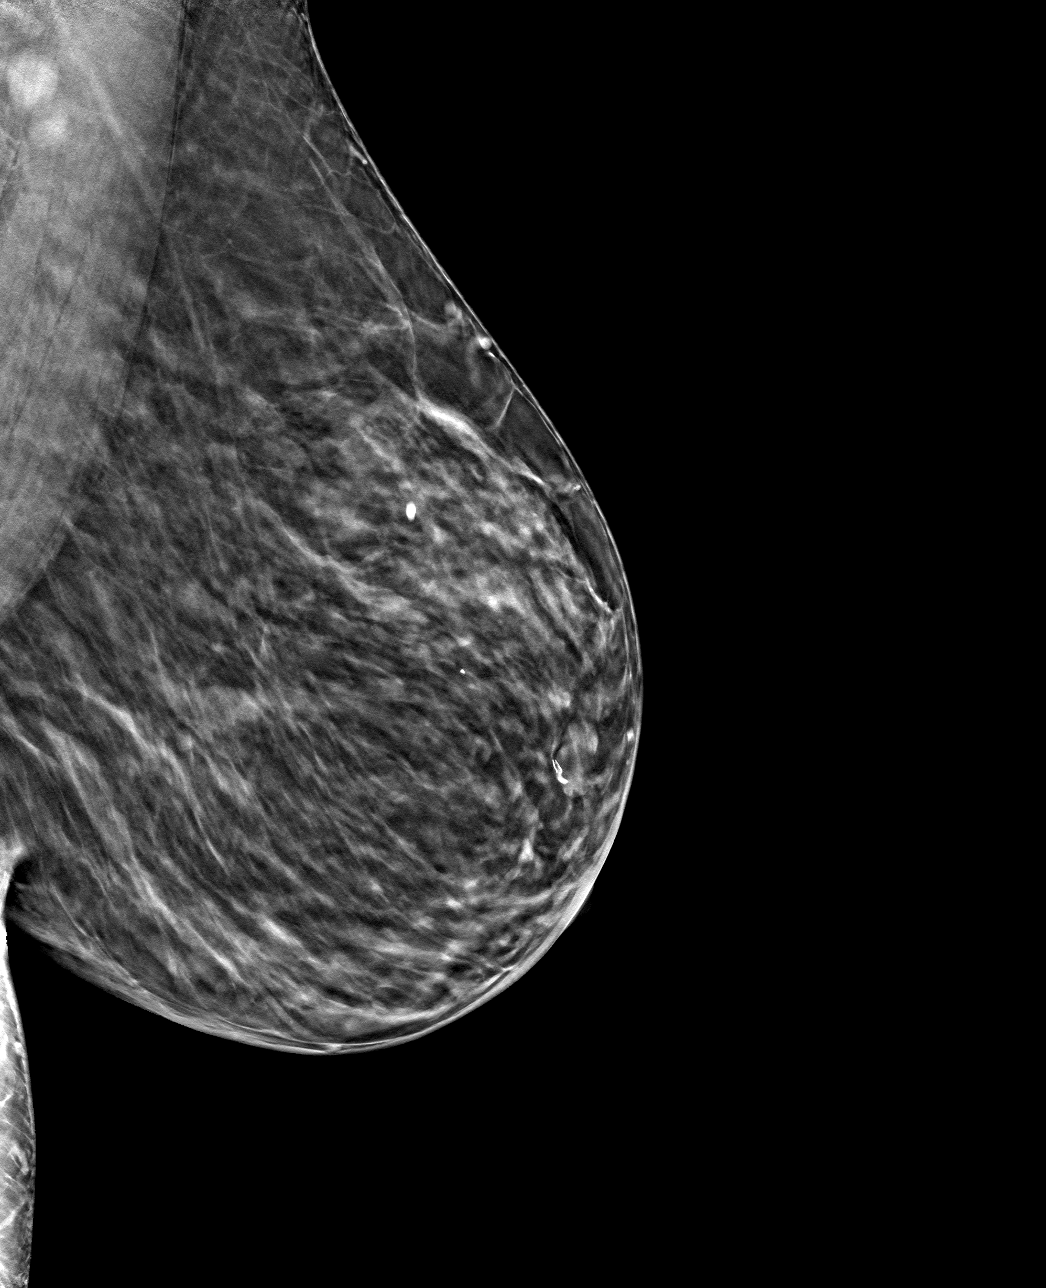

[L MLO tomo · tomo slice 21/42.0]
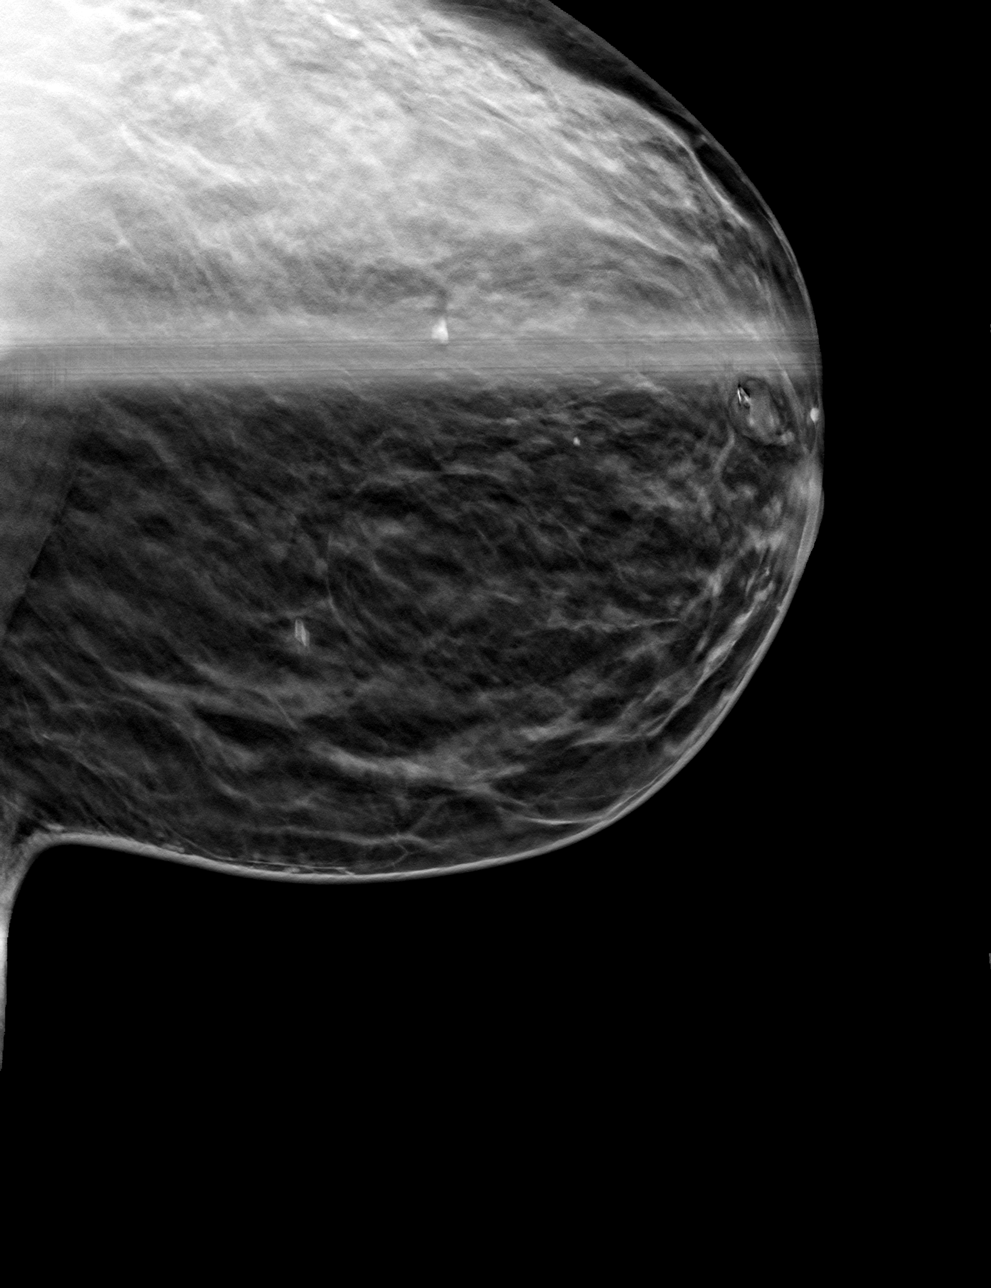

[4 of 12 positions shown; findings below may reference images not displayed]

No earlier
mammograms are available.

ACR Breast Density Category b: There are scattered areas of
fibroglandular density.
FINDINGS: Two biopsy clips are present within the LEFT breast, subareolar
region and lower outer quadrant respectively. The questioned mass
within the lower LEFT breast, 6 o'clock axis region is not
convincingly reproduced on today's additional diagnostic views
suggesting superimposition of normal fibroglandular tissues.
Ultrasound will be performed to ensure benignity.

Targeted ultrasound is performed, evaluating the entire lower LEFT
breast, showing only normal fibroglandular tissues and fat lobules
throughout. No solid or cystic mass.

Targeted ultrasound is performed of the bilateral axillary regions,
showing multiple morphologically abnormal lymph nodes with cortical
thickening of various degrees, greatest on the RIGHT at 4 mm
thickness and greatest on the LEFT measuring 7 mm thickness.
IMPRESSION: 1. Morphologically abnormal lymph nodes within each axilla.
Recommend 2 ultrasound-guided biopsies to include the RIGHT axillary
lymph node with cortical thickness measuring 4 mm and the LEFT
axillary lymph node with eccentric cortical thickening measuring up
to 7 mm.
2. No evidence of malignancy within the LEFT breast.

RECOMMENDATION:
1. Ultrasound-guided biopsies of bilateral axillary lymph nodes, as
detailed above.
2. If ultrasound-guided biopsies are benign and concordant,
recommend routine bilateral annual screening mammogram in 1 year.

I have discussed the findings and recommendations with the patient.
If applicable, a reminder letter will be sent to the patient
regarding the next appointment.

BI-RADS CATEGORY  4: Suspicious.

## 2021-06-26 DIAGNOSIS — R52 Pain, unspecified: Secondary | ICD-10-CM | POA: Diagnosis not present

## 2021-06-26 DIAGNOSIS — E1129 Type 2 diabetes mellitus with other diabetic kidney complication: Secondary | ICD-10-CM | POA: Diagnosis not present

## 2021-06-26 DIAGNOSIS — L299 Pruritus, unspecified: Secondary | ICD-10-CM | POA: Diagnosis not present

## 2021-06-26 DIAGNOSIS — D689 Coagulation defect, unspecified: Secondary | ICD-10-CM | POA: Diagnosis not present

## 2021-06-26 DIAGNOSIS — Z992 Dependence on renal dialysis: Secondary | ICD-10-CM | POA: Diagnosis not present

## 2021-06-26 DIAGNOSIS — N186 End stage renal disease: Secondary | ICD-10-CM | POA: Diagnosis not present

## 2021-06-26 DIAGNOSIS — N2581 Secondary hyperparathyroidism of renal origin: Secondary | ICD-10-CM | POA: Diagnosis not present

## 2021-06-29 DIAGNOSIS — D689 Coagulation defect, unspecified: Secondary | ICD-10-CM | POA: Diagnosis not present

## 2021-06-29 DIAGNOSIS — L299 Pruritus, unspecified: Secondary | ICD-10-CM | POA: Diagnosis not present

## 2021-06-29 DIAGNOSIS — N2581 Secondary hyperparathyroidism of renal origin: Secondary | ICD-10-CM | POA: Diagnosis not present

## 2021-06-29 DIAGNOSIS — E1129 Type 2 diabetes mellitus with other diabetic kidney complication: Secondary | ICD-10-CM | POA: Diagnosis not present

## 2021-06-29 DIAGNOSIS — R52 Pain, unspecified: Secondary | ICD-10-CM | POA: Diagnosis not present

## 2021-06-29 DIAGNOSIS — N186 End stage renal disease: Secondary | ICD-10-CM | POA: Diagnosis not present

## 2021-06-29 DIAGNOSIS — Z992 Dependence on renal dialysis: Secondary | ICD-10-CM | POA: Diagnosis not present

## 2021-07-01 DIAGNOSIS — L299 Pruritus, unspecified: Secondary | ICD-10-CM | POA: Diagnosis not present

## 2021-07-01 DIAGNOSIS — R52 Pain, unspecified: Secondary | ICD-10-CM | POA: Diagnosis not present

## 2021-07-01 DIAGNOSIS — D689 Coagulation defect, unspecified: Secondary | ICD-10-CM | POA: Diagnosis not present

## 2021-07-01 DIAGNOSIS — N2581 Secondary hyperparathyroidism of renal origin: Secondary | ICD-10-CM | POA: Diagnosis not present

## 2021-07-01 DIAGNOSIS — N186 End stage renal disease: Secondary | ICD-10-CM | POA: Diagnosis not present

## 2021-07-01 DIAGNOSIS — Z992 Dependence on renal dialysis: Secondary | ICD-10-CM | POA: Diagnosis not present

## 2021-07-01 DIAGNOSIS — E1129 Type 2 diabetes mellitus with other diabetic kidney complication: Secondary | ICD-10-CM | POA: Diagnosis not present

## 2021-07-02 ENCOUNTER — Other Ambulatory Visit: Payer: Self-pay

## 2021-07-02 ENCOUNTER — Ambulatory Visit (INDEPENDENT_AMBULATORY_CARE_PROVIDER_SITE_OTHER): Payer: Medicare Other | Admitting: Internal Medicine

## 2021-07-02 ENCOUNTER — Encounter: Payer: Self-pay | Admitting: Internal Medicine

## 2021-07-02 DIAGNOSIS — N186 End stage renal disease: Secondary | ICD-10-CM

## 2021-07-02 DIAGNOSIS — R52 Pain, unspecified: Secondary | ICD-10-CM

## 2021-07-02 DIAGNOSIS — Z992 Dependence on renal dialysis: Secondary | ICD-10-CM

## 2021-07-02 DIAGNOSIS — F320 Major depressive disorder, single episode, mild: Secondary | ICD-10-CM

## 2021-07-02 DIAGNOSIS — I10 Essential (primary) hypertension: Secondary | ICD-10-CM

## 2021-07-02 DIAGNOSIS — D1720 Benign lipomatous neoplasm of skin and subcutaneous tissue of unspecified limb: Secondary | ICD-10-CM

## 2021-07-02 DIAGNOSIS — I120 Hypertensive chronic kidney disease with stage 5 chronic kidney disease or end stage renal disease: Secondary | ICD-10-CM | POA: Diagnosis not present

## 2021-07-02 DIAGNOSIS — F32A Depression, unspecified: Secondary | ICD-10-CM | POA: Insufficient documentation

## 2021-07-02 MED ORDER — AMLODIPINE BESYLATE 10 MG PO TABS: 10.0000 mg | ORAL_TABLET | Freq: Every day | ORAL | 2 refills | Status: AC

## 2021-07-02 MED ORDER — SERTRALINE HCL 50 MG PO TABS: 50.0000 mg | ORAL_TABLET | Freq: Every day | ORAL | 2 refills | Status: DC

## 2021-07-02 NOTE — Progress Notes (Signed)
CC: 2 week f/u for HTN and OA  HPI:  [name] is a 55 y.o. female with a PMHx stated below and presents today for stated above. Please see the Encounters tab for problem-based Assessment & Plan for additional details.   Past Medical History:  Diagnosis Date   Anemia of chronic disease    Arthritis    Deceased-donor kidney transplant    Performed at [facility], April 2010.  Initial ESRD due to HTN nephropathy   Eczema    ESRD (end stage renal disease) (HCC)    s/p transplant creatinine baseline 1.1  M/W/F dialysis   FUO (fever of unknown origin) 05/17/2015   GERD (gastroesophageal reflux disease)    Headache(784.0)    History of hyperparathyroidism    Hypertension    Peritonitis ([place]) 10/2019   Shortness of breath    Wears glasses     Current Outpatient Medications on File Prior to Visit  Medication Sig Dispense Refill   amLODipine (NORVASC) 10 MG tablet Take 1 tablet (10 mg total) by mouth daily. 30 tablet 0   aspirin 81 MG EC tablet Take 81 mg by mouth as needed for pain.     carvedilol (COREG) 12.5 MG tablet Take 1 tablet (12.5 mg total) by mouth 2 (two) times daily with a meal. 180 tablet 3   cholecalciferol (VITAMIN D3) 25 MCG (1000 UNIT) tablet Take 1,000 Units by mouth daily.     cloNIDine (CATAPRES) 0.1 MG tablet Take 1 tablet (0.1 mg total) by mouth 2 (two) times daily. 60 tablet 2   dexamethasone (DECADRON) 1 MG tablet Take 1 tablet (1 mg total) by mouth every 12 (twelve) hours. Take 1 tablet twice daily then reduce to 1 tablet daily on 11/11/2020 for 3 more days. (Patient not taking: Reported on 04/01/2021) 5 tablet 0   ibuprofen (ADVIL) 600 MG tablet Take 1 tablet (600 mg total) by mouth every 8 (eight) hours as needed for moderate pain. 30 tablet 0   levETIRAcetam (KEPPRA) 250 MG tablet Take 1 tablet (250 mg total) by mouth 2 (two) times daily for 9 days. 18 tablet 0   lidocaine-prilocaine (EMLA) cream Apply 1 application topically every Monday,  Wednesday, and Friday with hemodialysis. (Patient not taking: Reported on 04/01/2021)     omeprazole (PRILOSEC OTC) 20 MG tablet Take 20 mg by mouth daily as needed for heartburn.     polyethylene glycol (MIRALAX / GLYCOLAX) 17 g packet Take 17 g by mouth daily. 30 each 0   No current facility-administered medications on file prior to visit.    Family History  Problem Relation Age of Onset   Hypertension Mother    Hypertension Father    Hypertension Sister    Hyperlipidemia Sister    Hypertension Sister    Diabetes Brother    Deep vein thrombosis Brother    Kidney disease Brother        on HD   Colon cancer Neg Hx    Esophageal cancer Neg Hx    Rectal cancer Neg Hx    Breast cancer Neg Hx     Social History   Socioeconomic History   Marital status: Single    Spouse name: Not on file   Number of children: 1   Years of education: Not on file   Highest education level: Not on file  Occupational History   Occupation: unemployed  Tobacco Use   Smoking status: Every Day    Packs/day: 0.50  Years: 27.00    Pack years: 13.50    Types: Cigarettes   Smokeless tobacco: Never   Tobacco comments:    10 cigarettes a day  Vaping Use   Vaping Use: Never used  Substance and Sexual Activity   Alcohol use: No    Alcohol/week: 0.0 standard drinks    Comment: occasional drinker noted in the past   Drug use: No   Sexual activity: Yes    Partners: Male    Birth control/protection: None  Other Topics Concern   Not on file  Social History Narrative   Single, 1 daughter   Lives with daughter (born 45) and grandkids   sister helps her with medications etc.   cigarette smoker, rare EtOH, no drugs   Social Determinants of Radio broadcast assistant Strain: Not on file  Food Insecurity: Not on file  Transportation Needs: Not on file  Physical Activity: Not on file  Stress: Not on file  Social Connections: Not on file  Intimate Partner Violence: Not on file    Review of  Systems: ROS negative except for what is noted on the assessment and plan.  Vitals:   07/02/21 1425 07/02/21 1427  BP:  (!) 182/88  Pulse:  85  Temp:  98.3 F (36.8 C)  TempSrc:  Oral  SpO2:  98%  Weight: 162 lb 3.2 oz (73.6 kg)   Height: 5\' 7"  (1.702 m)      Physical Exam: Constitutional: alert, well-appearing, in NAD HENT: normocephalic, atraumatic, mucous membranes moist Eyes: conjunctiva non-erythematous, EOMI Cardiovascular: RRR, no m/r/g, non-edematous bilateral LE Pulmonary/Chest: normal work of breathing on RA, LCTAB Abdominal: soft, non-tender to palpation, non-distended MSK: reduced bulk and tone  Neurological: A&O x 3 Skin: warm and dry    Assessment & Plan:   See Encounters Tab for problem based charting.  Patient seen with Dr. Lavonda Jumbo, MD  Internal Medicine Resident, PGY-1 [facility]

## 2021-07-02 NOTE — Assessment & Plan Note (Addendum)
PHQ score of 11, 15 during previous visit 3 weeks ago. Pt reports feeling depressed because of her health related issues. She reports this has been ongoing for ~1 yr. She denies SI. She declines counseling services but is willing to try an SSRI. Counseled on potential adverse effects and to take daily.   Sertraline 50 mg qd  F/u in 4 weeks for progress

## 2021-07-02 NOTE — Assessment & Plan Note (Signed)
Reports that she continues to tolerate HD well.   Continue HD per nephrology schedule

## 2021-07-02 NOTE — Assessment & Plan Note (Addendum)
BP 182/88 today. Was 141/86 during previous visit 3 weeks ago. Reports good compliance to meds without any adverse effects. Regimen includes: coreg 12.5, clonidine 0.1 and amlodipine 20 qd. She does HD MWF and does not take these BP meds on HD days per her nephrologist because of hypotension. She last took these meds Sunday night as she had HD yesterday. She will take all 3 meds tonight. Denies headaches, CP, SHOB, and leg swelling. Exam unchanged from prior and benign. Euvolemic on exam. Vitals otherwise stable.   Continue current BP regimen without any changes Pt advised to lop BP at home and bring with her at f/u

## 2021-07-02 NOTE — Patient Instructions (Addendum)
Thank you, Ms.Nelsy Madonna Foster-Obua for allowing [place] to provide your care today!  Today we discussed:  High blood pressure: Your BP is high today.  Continue your current medications without any changes. Please start checking your BP at home and bring a log of it during your next visit.   Chronic pain: Continue to follow up with the orthopedic doctors for this pain.  Please follow up with [facility] to get your ultrasound guided biopsy of the left breast.   Decreased mood Please start taking sertraline 50 mg every day   I will call you if any labs, tests, or imaging results require any further attention or action.   I have ordered the following labs for you:  Lab Orders  No laboratory test(s) ordered today    Medications ordered   Start the following medications: Meds ordered this encounter  Medications   sertraline (ZOLOFT) 50 MG tablet    Sig: Take 1 tablet (50 mg total) by mouth daily.    Dispense:  90 tablet    Refill:  2   amLODipine (NORVASC) 10 MG tablet    Sig: Take 1 tablet (10 mg total) by mouth daily.    Dispense:  90 tablet    Refill:  2     Follow up in: 2 weeks    Should you have any questions or concerns please call the internal medicine clinic at [phone].     Lajean Manes, MD  Internal Medicine Resident, PGY-1 [facility]

## 2021-07-02 NOTE — Assessment & Plan Note (Addendum)
Reports slight improvement in pain. Was referred to orthopedics during prior visit. Report that she has a slipped disc resulting in chronic diffuse pain. She was offered surgery but she prefers medical management instead at this time. She reports that she plans on continuing to follow up with them regarding this. She is scheduled for an MRI of the right shoulder for suspected tear on 11/30.  Follow up on imaging Continue following up with orthopedics  Continue tylenol prn Thermotherapy  Addendum MRI shoulder results with severe tendinosis of infra and supraspinatus tendons, low grade rotator cuff atrophy. Tendinosis and low-grade articular sided tearing of the cephalad fibers of the subscapularis tendon at the footprint. Intra-articular long head biceps tendinosis. Pt followed by orthopedic surgery.

## 2021-07-02 NOTE — Assessment & Plan Note (Addendum)
Pt reports that this is improving and decreasing in size. Reports that it is not bothersome. Continues to deny chills, nausea and vomiting. She is scheduled to have a L lower extremity soft tissue image on 11/30   F/u on imaging   Addendum Soft tissue MRI with complex cystic lesion within the palpable area of concern along the left lateral hip without internal vascularity, which is a reassuring feature. Ddx includes fat necrosis or a small hematoma. If this lesion is persistent at f/u visit, will consider MRI with and without contrast for further eval.

## 2021-07-03 ENCOUNTER — Ambulatory Visit (HOSPITAL_COMMUNITY)
Admission: RE | Admit: 2021-07-03 | Discharge: 2021-07-03 | Disposition: A | Discharge status: Home / Self Care | Payer: Medicare Other | Source: Ambulatory Visit | Attending: Internal Medicine | Admitting: Internal Medicine

## 2021-07-03 DIAGNOSIS — M75101 Unspecified rotator cuff tear or rupture of right shoulder, not specified as traumatic: Secondary | ICD-10-CM | POA: Diagnosis not present

## 2021-07-03 DIAGNOSIS — D1724 Benign lipomatous neoplasm of skin and subcutaneous tissue of left leg: Secondary | ICD-10-CM

## 2021-07-03 DIAGNOSIS — I129 Hypertensive chronic kidney disease with stage 1 through stage 4 chronic kidney disease, or unspecified chronic kidney disease: Secondary | ICD-10-CM | POA: Diagnosis not present

## 2021-07-03 DIAGNOSIS — L729 Follicular cyst of the skin and subcutaneous tissue, unspecified: Secondary | ICD-10-CM | POA: Diagnosis not present

## 2021-07-03 DIAGNOSIS — Z992 Dependence on renal dialysis: Secondary | ICD-10-CM | POA: Diagnosis not present

## 2021-07-03 DIAGNOSIS — L299 Pruritus, unspecified: Secondary | ICD-10-CM | POA: Diagnosis not present

## 2021-07-03 DIAGNOSIS — N2581 Secondary hyperparathyroidism of renal origin: Secondary | ICD-10-CM | POA: Diagnosis not present

## 2021-07-03 DIAGNOSIS — R2242 Localized swelling, mass and lump, left lower limb: Secondary | ICD-10-CM | POA: Diagnosis not present

## 2021-07-03 DIAGNOSIS — M67911 Unspecified disorder of synovium and tendon, right shoulder: Secondary | ICD-10-CM | POA: Diagnosis not present

## 2021-07-03 DIAGNOSIS — M19011 Primary osteoarthritis, right shoulder: Secondary | ICD-10-CM | POA: Diagnosis not present

## 2021-07-03 DIAGNOSIS — L989 Disorder of the skin and subcutaneous tissue, unspecified: Secondary | ICD-10-CM | POA: Diagnosis not present

## 2021-07-03 DIAGNOSIS — D689 Coagulation defect, unspecified: Secondary | ICD-10-CM | POA: Diagnosis not present

## 2021-07-03 DIAGNOSIS — M25411 Effusion, right shoulder: Secondary | ICD-10-CM | POA: Diagnosis not present

## 2021-07-03 DIAGNOSIS — E1129 Type 2 diabetes mellitus with other diabetic kidney complication: Secondary | ICD-10-CM | POA: Diagnosis not present

## 2021-07-03 DIAGNOSIS — N186 End stage renal disease: Secondary | ICD-10-CM | POA: Diagnosis not present

## 2021-07-03 DIAGNOSIS — R52 Pain, unspecified: Secondary | ICD-10-CM | POA: Diagnosis not present

## 2021-07-03 IMAGING — MR MR SHOULDER*R* W/O CM
4 of 5 series · 18 of 40 positions shown · non-contrast
Comparison: None.

CLINICAL DATA: shoulder pain; rotator cuff tear susptected

EXAM:
MRI OF THE RIGHT SHOULDER WITHOUT CONTRAST
TECHNIQUE: Multiplanar, multisequence MR imaging of the shoulder was performed.
No intravenous contrast was administered.

[Series 3: T2 fat-sat · axial · 4.0mm · 0.27mm/px · z∈[-33,+52]mm · 4 of 23 slices shown (1 of 3)]
[im 1/23]
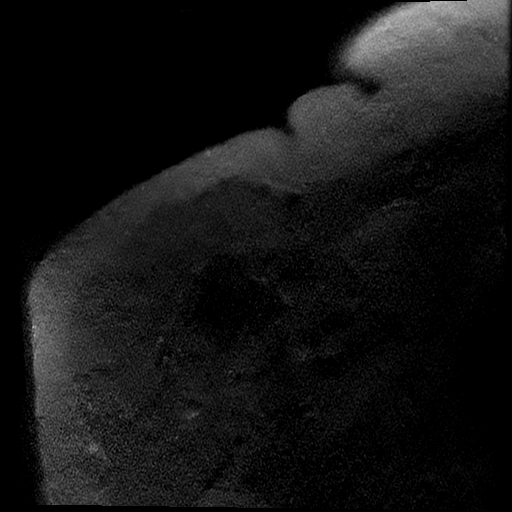
[im 4/23]
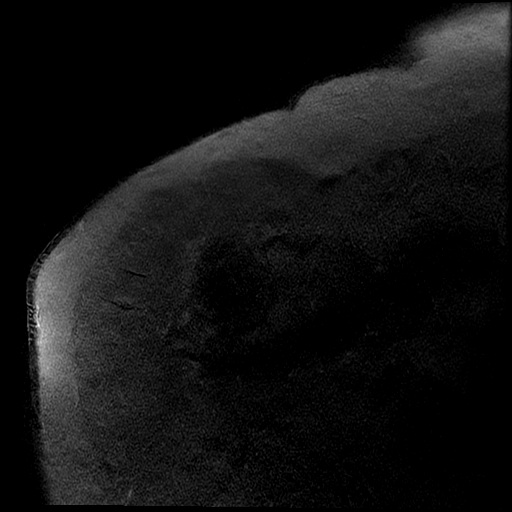
[im 13/23]
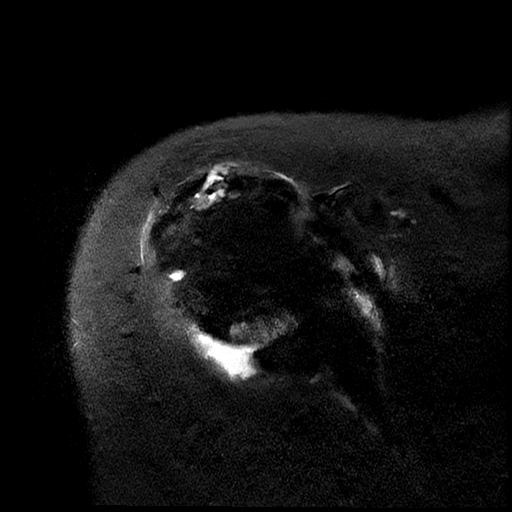
[im 19/23]
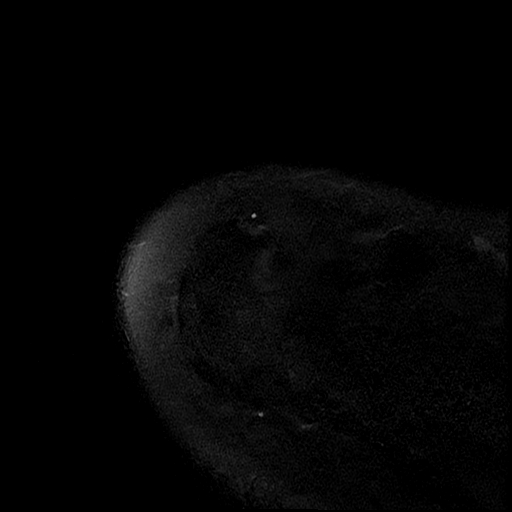

[Series 4: T2 fat-sat · oblique · 4.0mm · 0.27mm/px · 3 of 21 slices shown (2 of 3)]
[im 3/21]
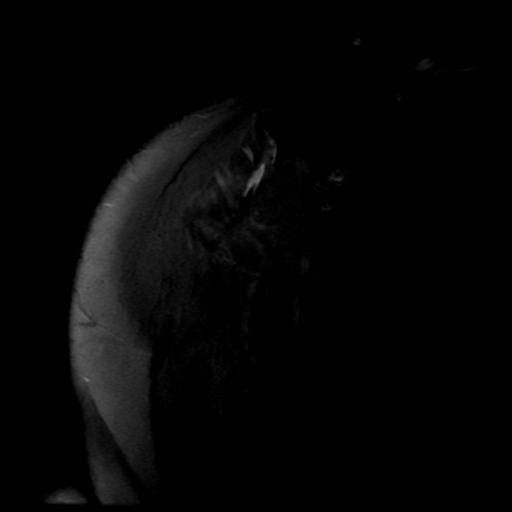
[im 12/21]
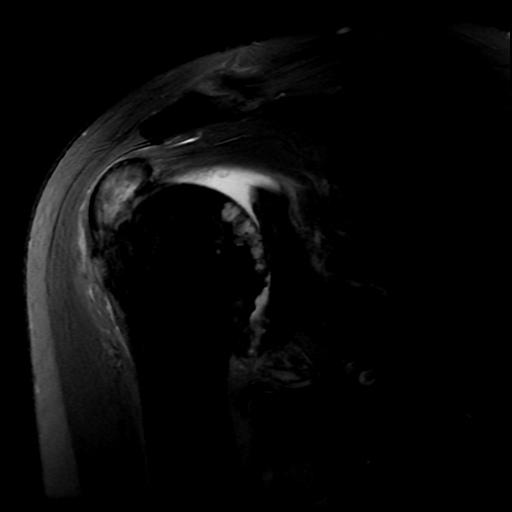
[im 18/21]
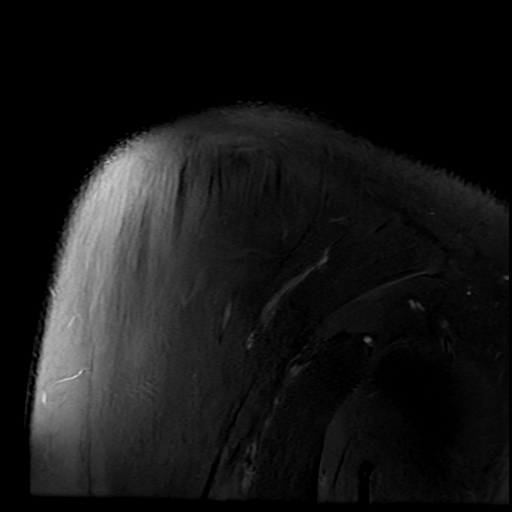

[Series 5: PD fat-sat · oblique · 4.0mm · 0.27mm/px · 8 of 21 slices shown]
[im 1/21]
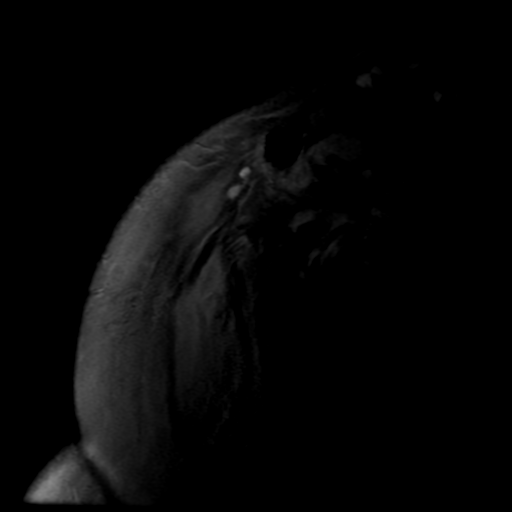
[im 3/21]
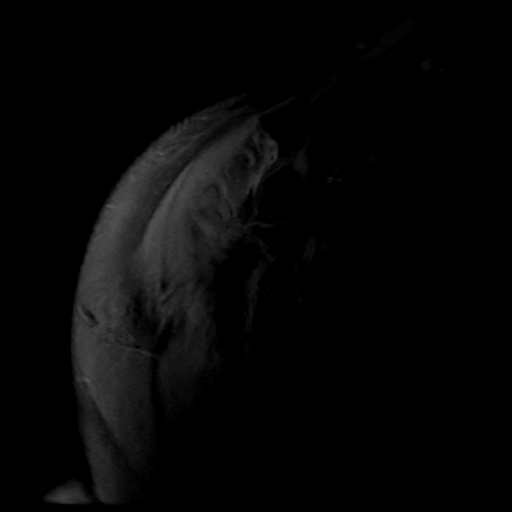
[im 6/21]
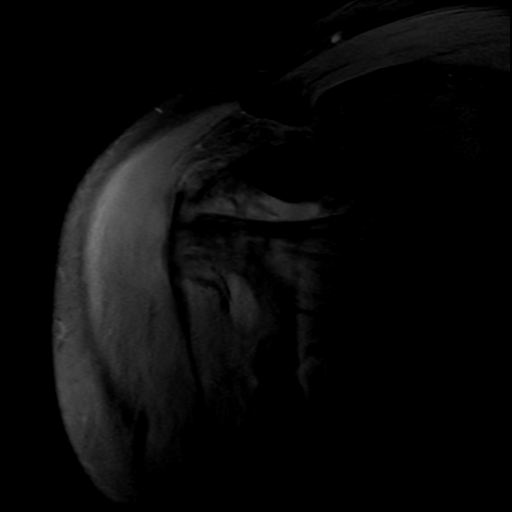
[im 9/21]
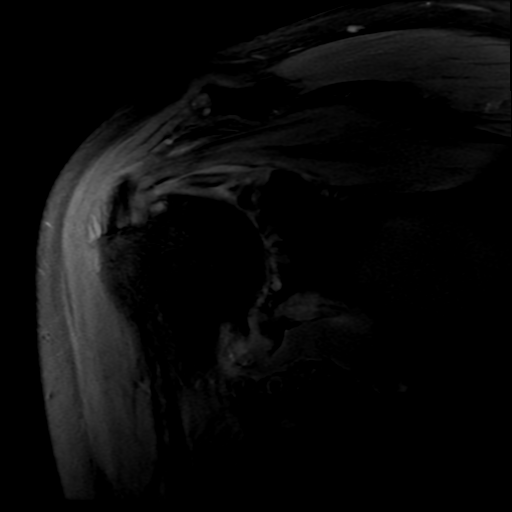
[im 12/21]
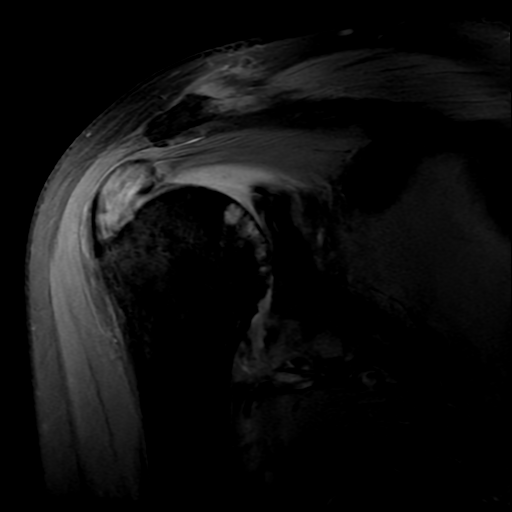
[im 15/21]
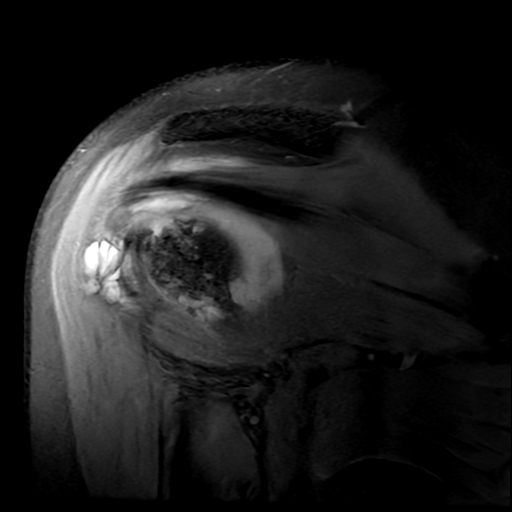
[im 18/21]
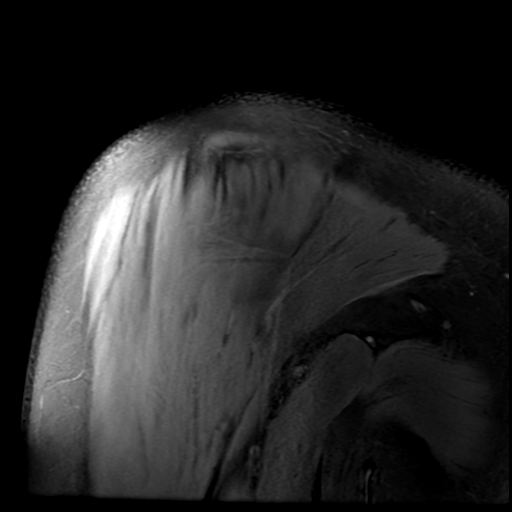
[im 21/21]
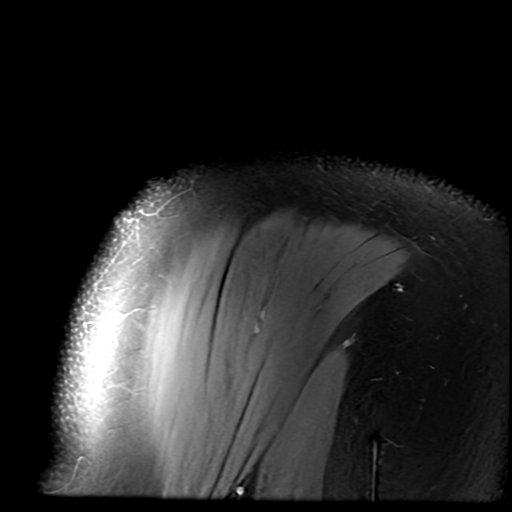

[Series 9: T2 fat-sat · oblique · 4.0mm · 0.31mm/px · 3 of 22 slices shown (3 of 3)]
[im 4/22]
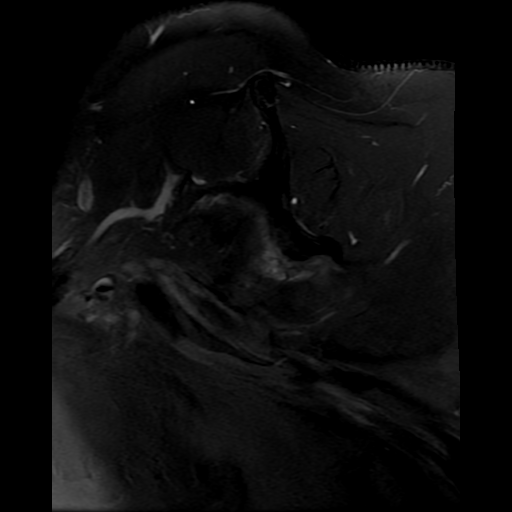
[im 13/22]
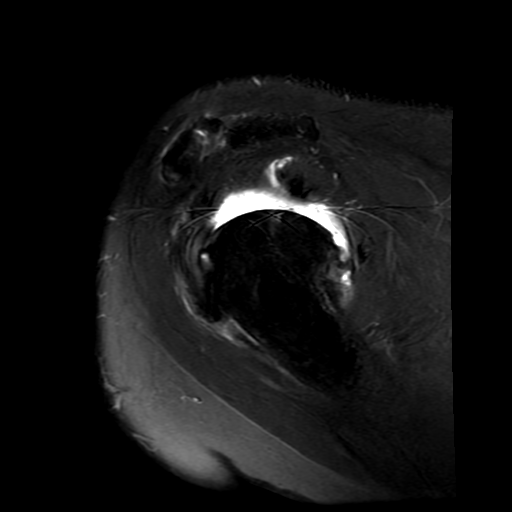
[im 19/22]
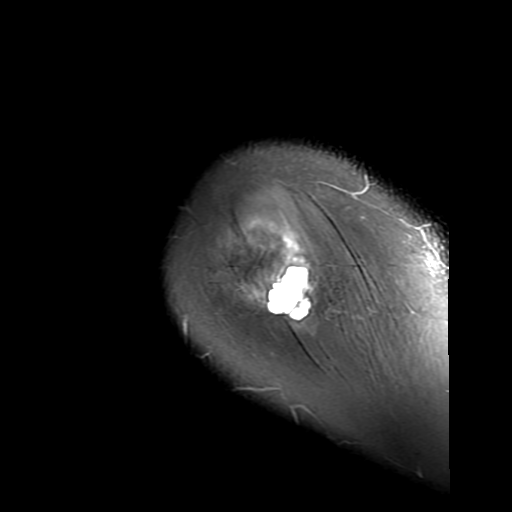

[18 of 40 positions shown; findings below may reference images not displayed]

FINDINGS: Rotator cuff: There is severe tendinosis of the distal supraspinatus
and infraspinatus tendons, with probable intermediate grade tearing
of the mid to posterior supraspinatus tendon and high-grade
articular sided tearing of the infraspinatus tendon at the
footprint. Teres minor tendon is intact. There is tendinosis with
low-grade articular sided tearing of the cephalad fibers of the
subscapularis tendon at the footprint.

Muscles: Streaky low-grade muscle atrophy of the rotator cuff
musculature.

Biceps Long Head: Intra-articular long head biceps tendinosis.
Intact extra-articular tendon within the bicipital groove.

Acromioclavicular Joint: Moderate-severe arthropathy of the
acromioclavicular joint. Small amount of subacromial/subdeltoid
bursal fluid .

Glenohumeral Joint: Small joint effusion. High-grade, essentially
full-thickness cartilage loss of the glenohumeral joint with
subchondral cyst formation

Labrum: Diffuse labral degeneration with superior and
posterosuperior labral tearing.

Bones: No fracture or dislocation. Diffusely low T1 marrow signal
particularly in the scapula, clavicle, and acromion.

Other: Ganglion cysts noted along the posterolateral greater
tuberosity measuring 1.6 x 1.1 x 1.7 cm and superiorly along the
posterior aspect of the infraspinatus measuring 1.7 x 1.6 x 0.6 cm.
IMPRESSION: Severe tendinosis of the supraspinatus and infraspinatus tendons
with probable intermediate grade tearing of the supraspinatus tendon
and high-grade articular sided tearing of the infraspinatus tendon
at the footprint.

Tendinosis and low-grade articular sided tearing of the cephalad
fibers of the subscapularis tendon at the footprint. Intra-articular
long head biceps tendinosis.

Severe glenohumeral osteoarthritis with essentially full-thickness
cartilage loss. Diffuse labral degeneration with superior and
posterosuperior labral tearing. Moderate-severe AC joint
arthropathy. Small joint effusion.

Diffusely low T1 marrow signal, which is nonspecific and has a broad
differential, but possibly related to the patient's chronic renal
disease.

Ganglion cyst formation along the posterolateral aspect of the
greater tuberosity and superiorly adjacent to the infraspinatus
tendon.

## 2021-07-03 IMAGING — US US EXTREM LOW*L* LIMITED
1 series · 14 of 19 positions shown · non-contrast
Comparison: None.

CLINICAL DATA: left lateral hip lipoma suspected

EXAM:
ULTRASOUND LEFT LOWER EXTREMITY LIMITED
TECHNIQUE: Ultrasound examination of the lower extremity soft tissues was
performed in the area of clinical concern.

[Series 1: us left lower extrem ltd soft tissue non vascular · 19 acquisitions, 14 frames shown]
[im 1/19]
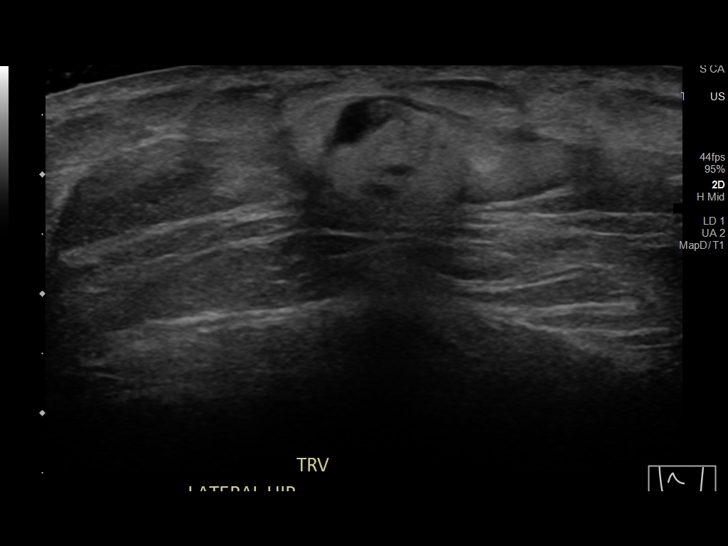
[im 3/19]
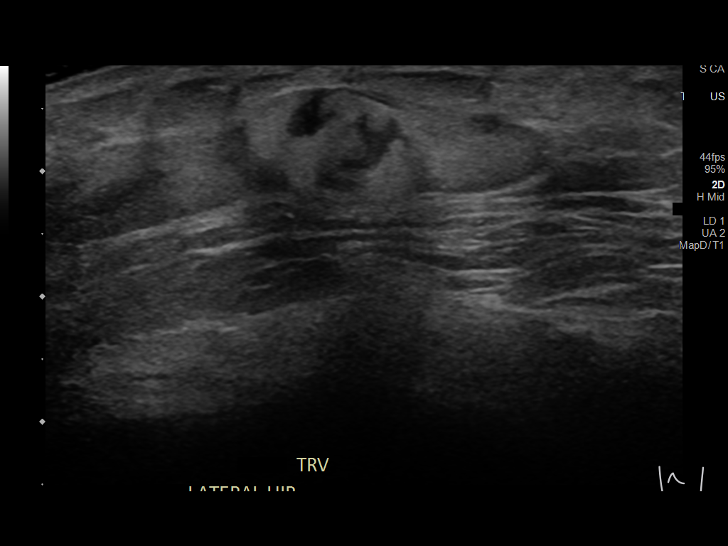
[im 4/19]
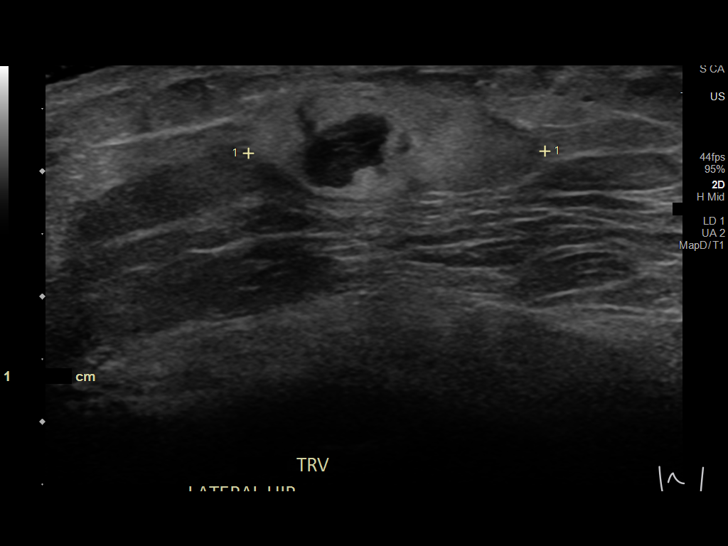
[im 5/19]
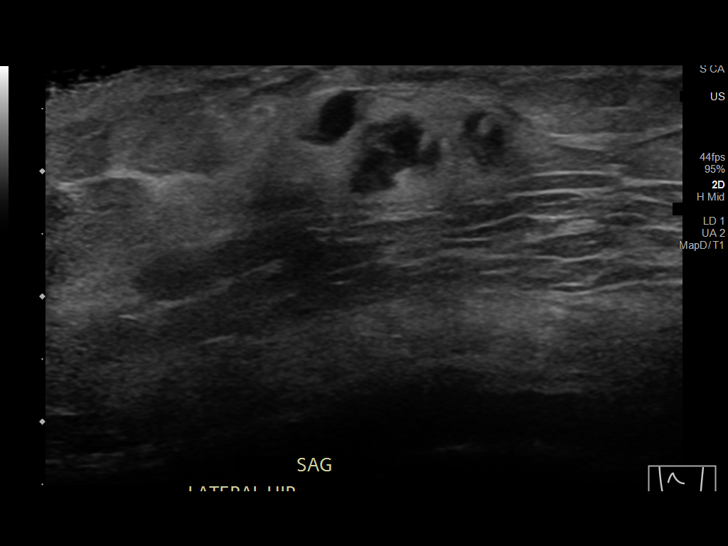
[im 7/19]
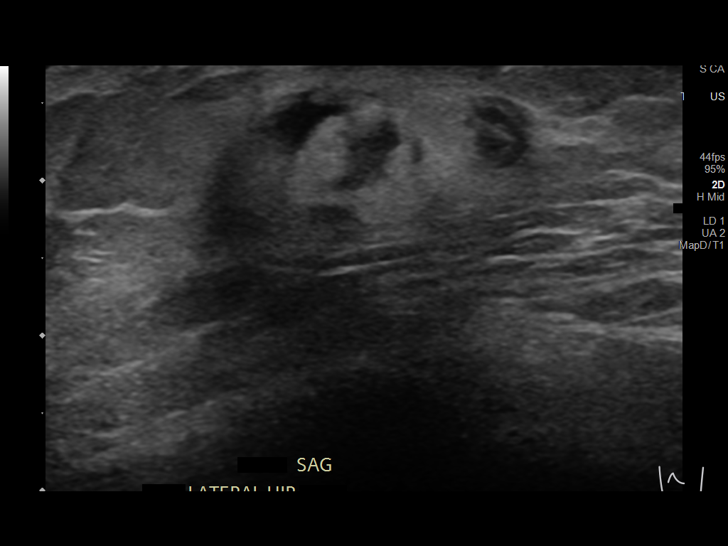
[im 8/19]
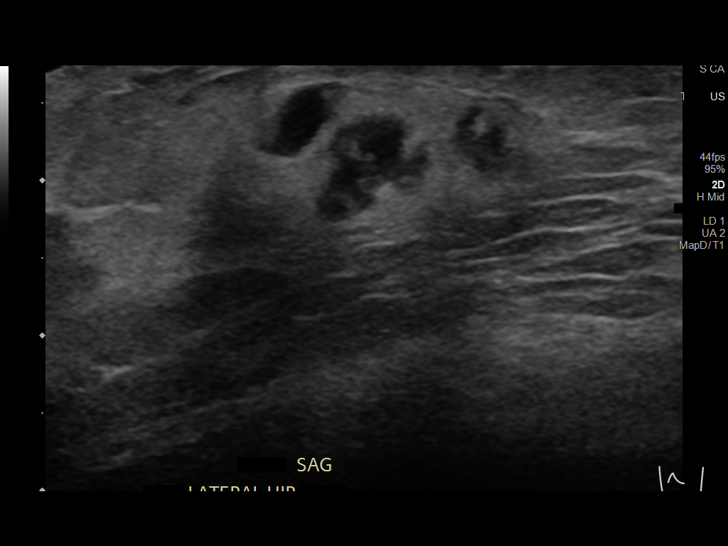
[im 9/19]
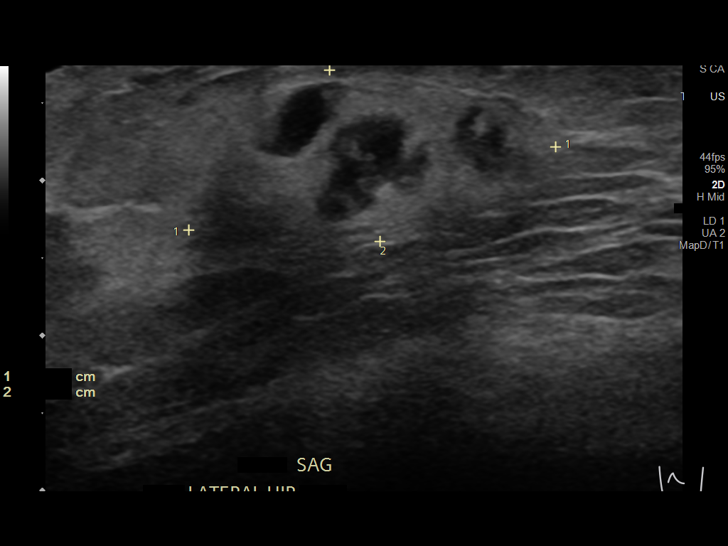
[im 11/19]
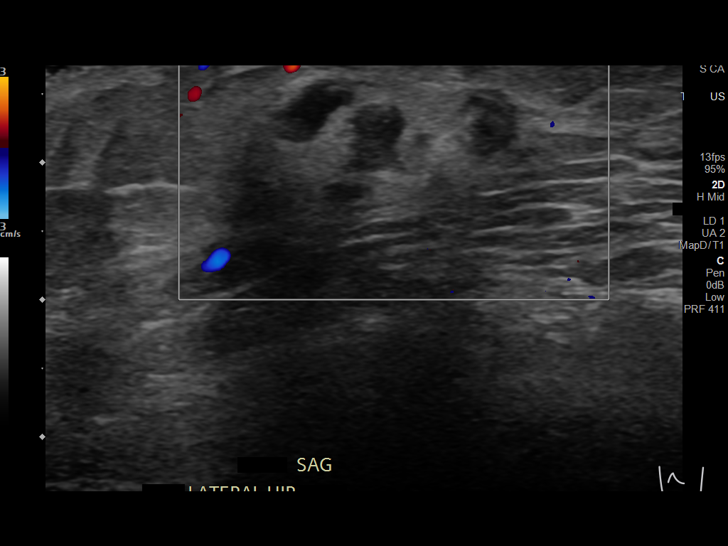
[im 12/19]
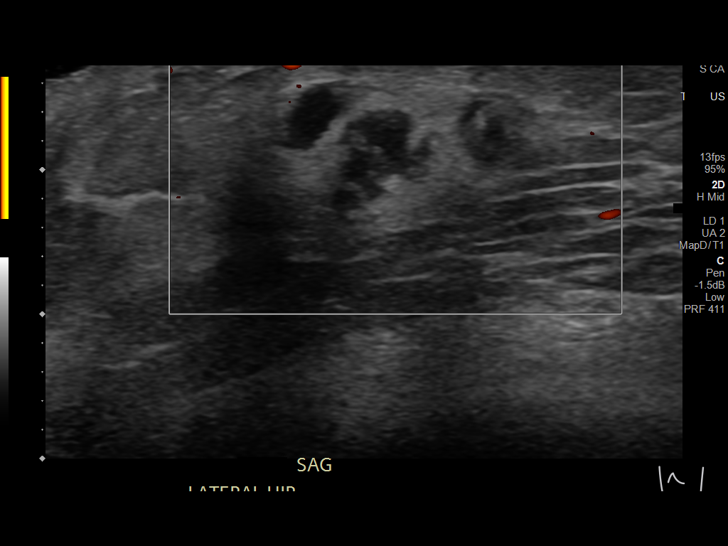
[im 13/19]
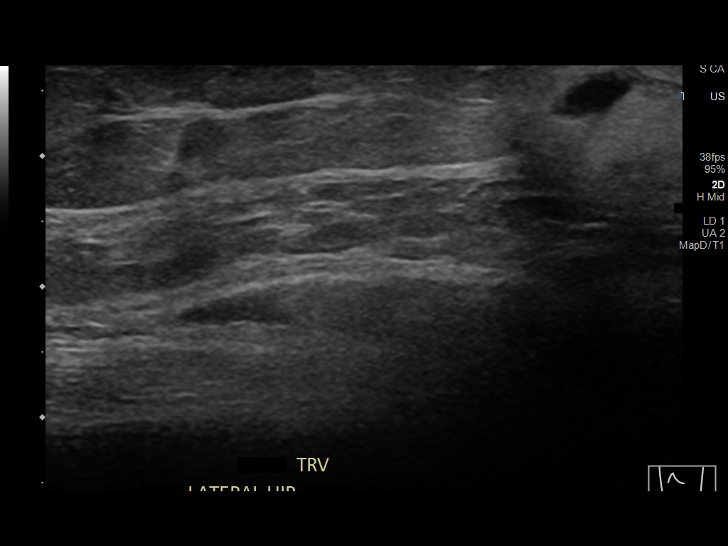
[im 15/19]
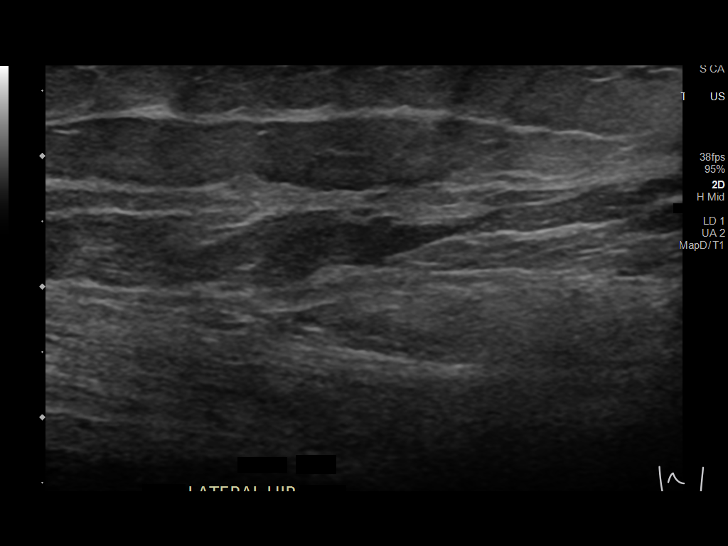
[im 16/19]
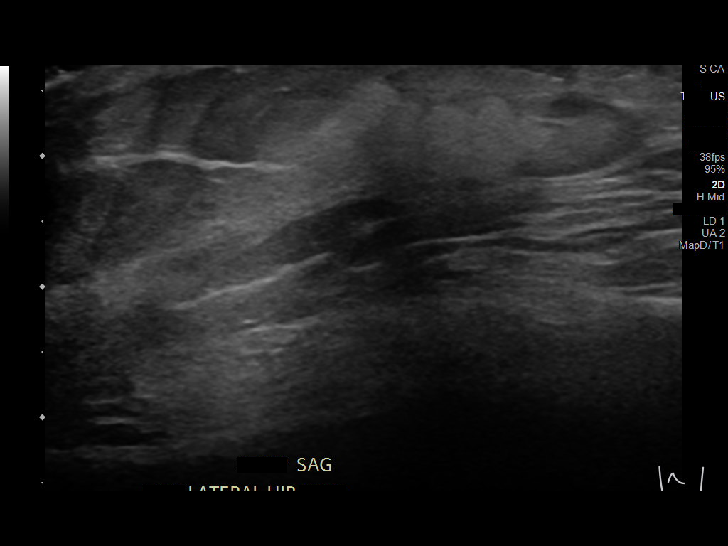
[im 17/19]
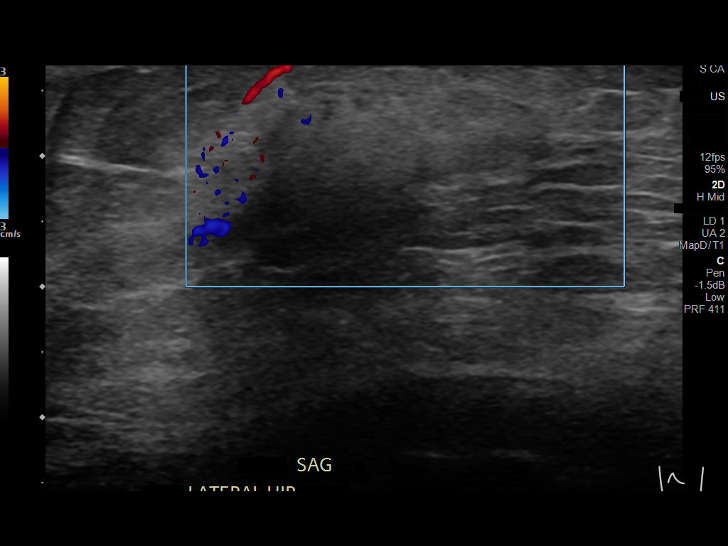
[im 19/19]
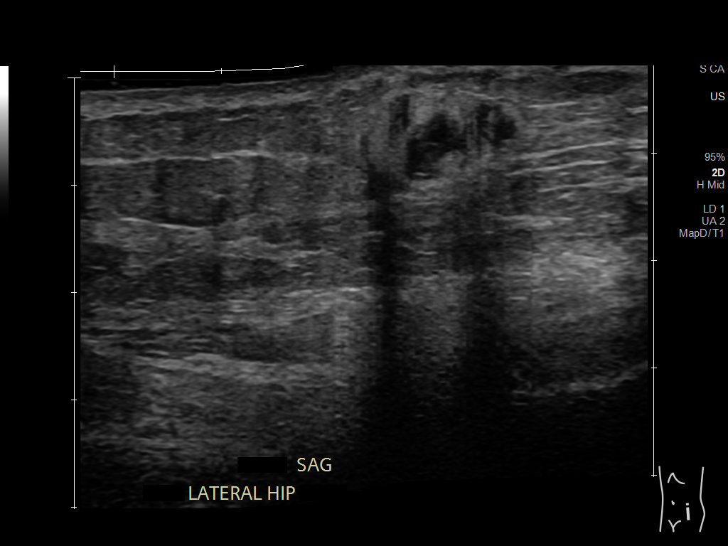

[14 of 19 positions shown; findings below may reference images not displayed]

FINDINGS: Within the palpable area of concern, there is a subcutaneous complex
cystic lesion measuring 2.4 x 1.2 x 2.4 cm. There is no internal
vascularity.
IMPRESSION: Complex cystic lesion within the palpable area of concern along the
left lateral hip without internal vascularity, which is a reassuring
feature. This could potentially represent fat necrosis or a small
hematoma, correlate with history of trauma. Recommend clinical
follow-up. If this lesion is persistent, MRI with and without
contrast would be useful for further evaluation.

## 2021-07-04 ENCOUNTER — Other Ambulatory Visit (HOSPITAL_COMMUNITY)
Admission: RE | Admit: 2021-07-04 | Discharge: 2021-07-04 | Disposition: A | Discharge status: Home / Self Care | Payer: Medicare Other | Source: Ambulatory Visit | Attending: Internal Medicine | Admitting: Internal Medicine

## 2021-07-04 ENCOUNTER — Other Ambulatory Visit: Payer: Medicare Other

## 2021-07-04 ENCOUNTER — Ambulatory Visit
Admission: RE | Admit: 2021-07-04 | Discharge: 2021-07-04 | Disposition: A | Discharge status: Home / Self Care | Payer: Medicare Other | Source: Ambulatory Visit | Attending: Internal Medicine | Admitting: Internal Medicine

## 2021-07-04 ENCOUNTER — Other Ambulatory Visit: Payer: Self-pay | Admitting: Internal Medicine

## 2021-07-04 DIAGNOSIS — R599 Enlarged lymph nodes, unspecified: Secondary | ICD-10-CM

## 2021-07-04 DIAGNOSIS — R590 Localized enlarged lymph nodes: Secondary | ICD-10-CM | POA: Diagnosis not present

## 2021-07-04 IMAGING — MG MM BREAST LOCALIZATION CLIP
2 series · 3 of 6 positions shown · non-contrast
Comparison: Previous exam(s).

CLINICAL DATA: Evaluate bilateral biopsy markers

EXAM:
3D DIAGNOSTIC BILATERAL MAMMOGRAM POST ULTRASOUND BIOPSY

[L MLO synth-2D]
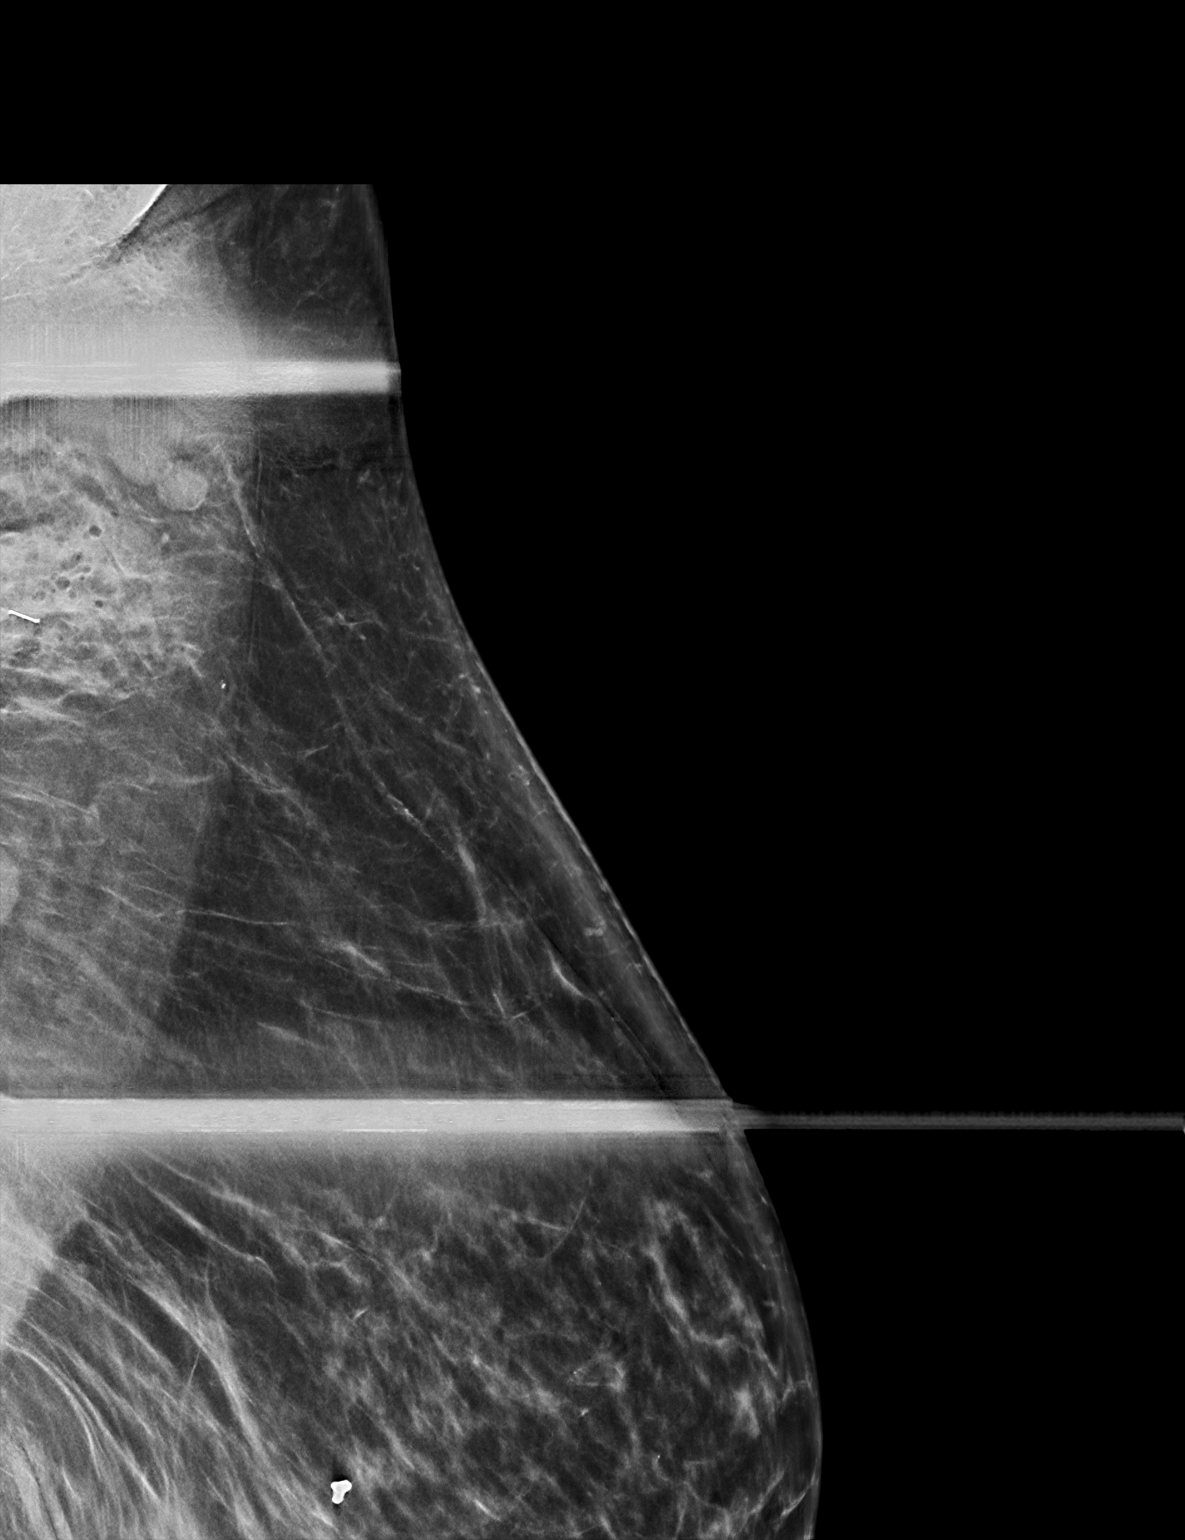

[L MLO tomo · 2 of 74 frames shown]
[frame 24/74]
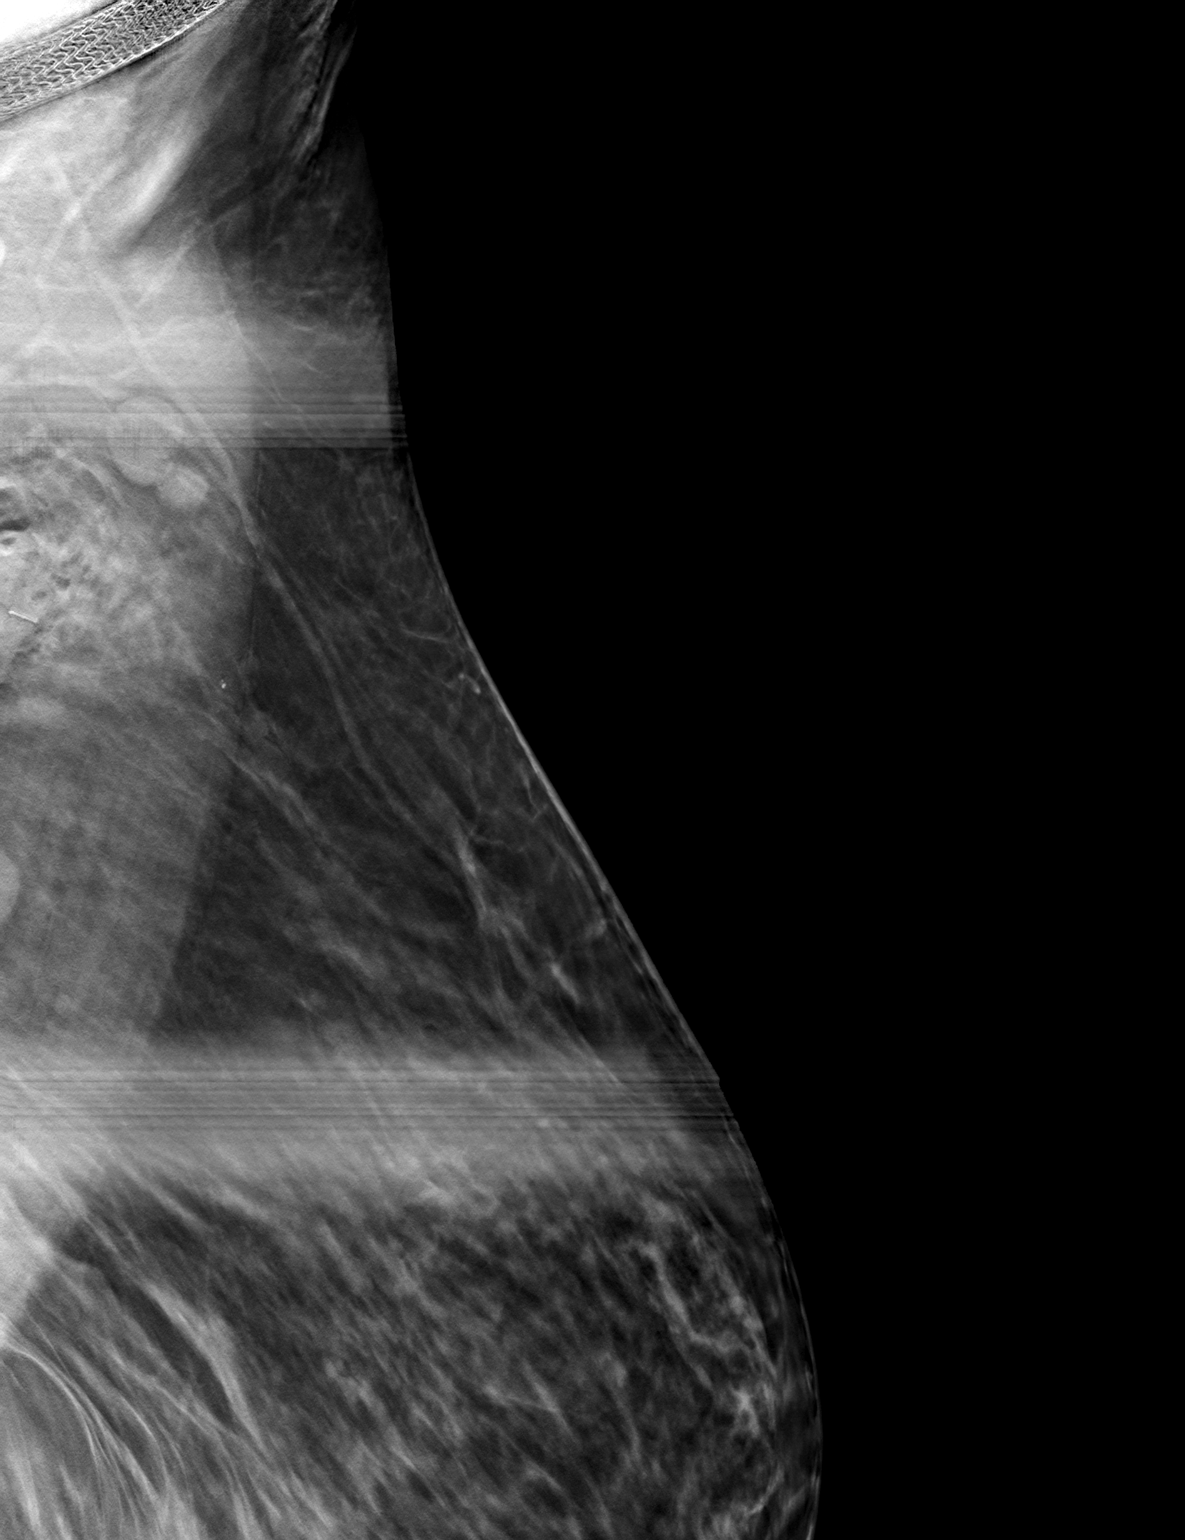
[frame 37/74]
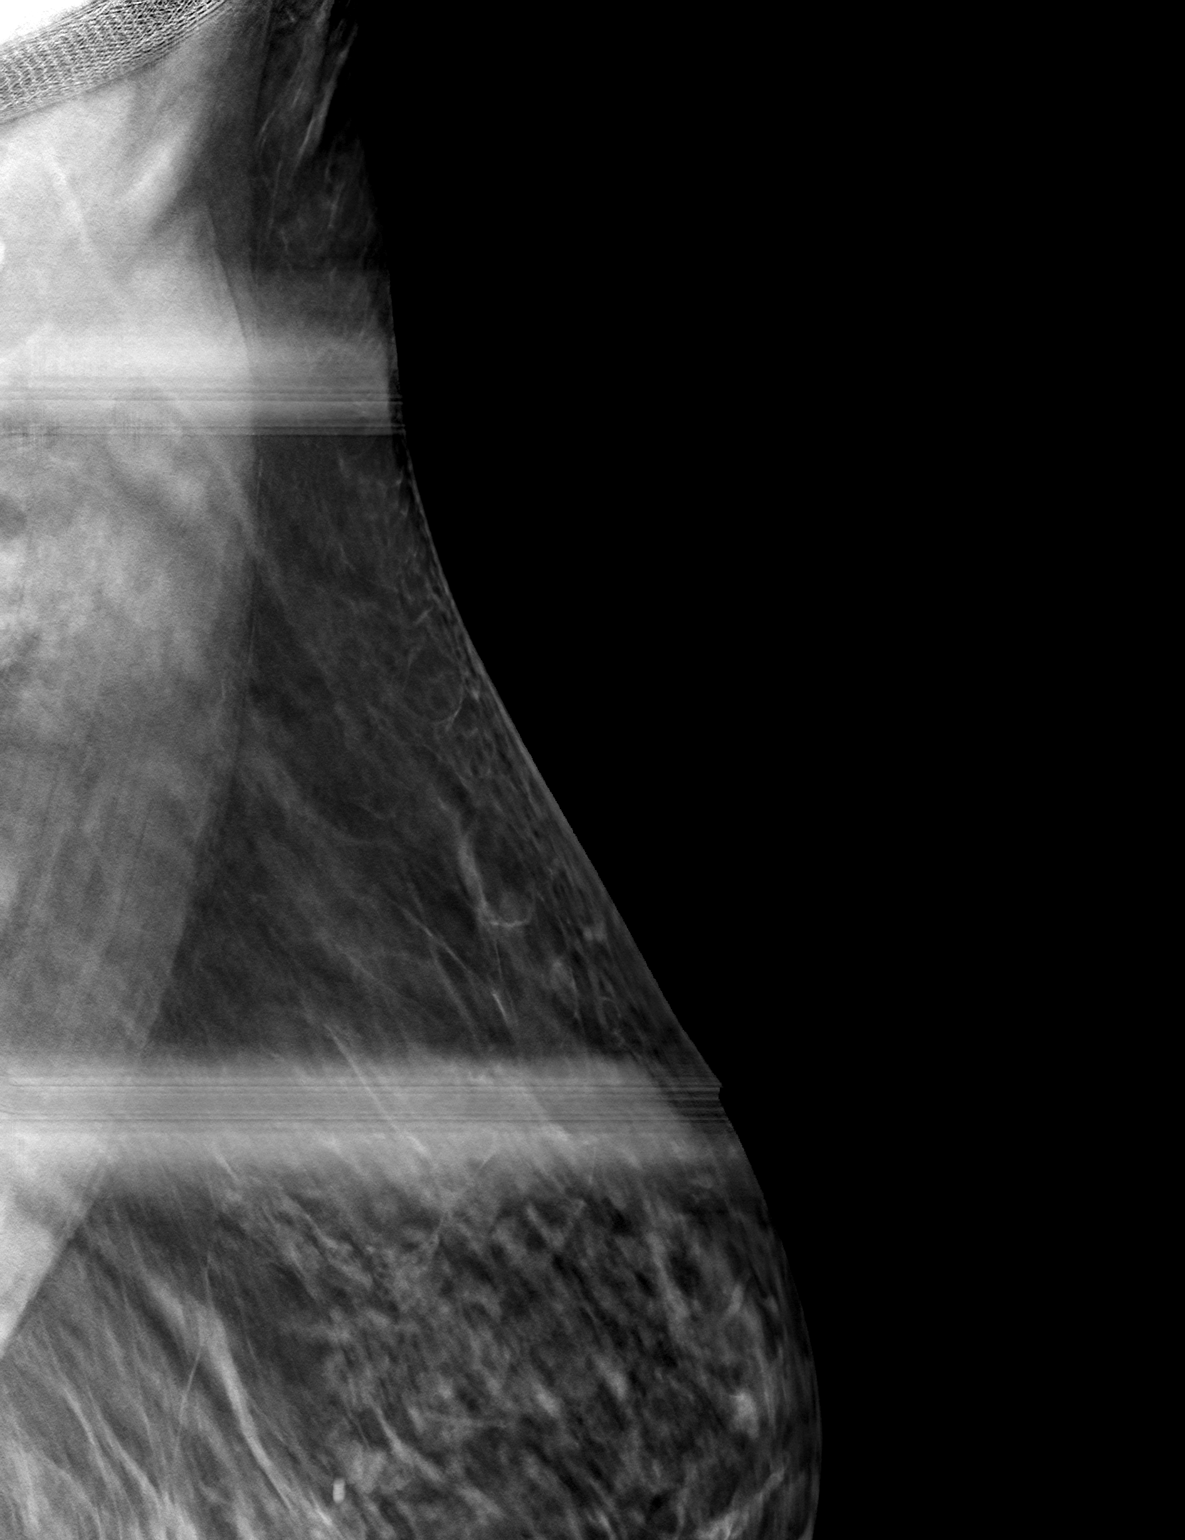

[3 of 6 positions shown; findings below may reference images not displayed]

FINDINGS: 3D Mammographic images were obtained following ultrasound guided
biopsy of bilateral axillary lymph nodes. NURIT markers were
placed bilaterally and are noted on today's mammogram within the
biopsied lymph nodes.
IMPRESSION: Bilateral NURIT markers are identified within the biopsied lymph
nodes.

Final Assessment: Post Procedure Mammograms for Marker Placement

## 2021-07-04 IMAGING — US US AXILLARY NODE CORE BIOPSY RIGHT
1 series · 10 of 10 positions shown · non-contrast
Comparison: Previous exam(s).
COMPARISON: Previous exam(s).

Addendum:
CLINICAL DATA: Biopsy of bilateral axillary adenopathy

EXAM:
US AXILLARY NODE CORE BIOPSY BILATERAL

[Series 1: us axillary node core biopsy right · 0.07mm/px · 10 of 10 slices shown]
[im 1/10]
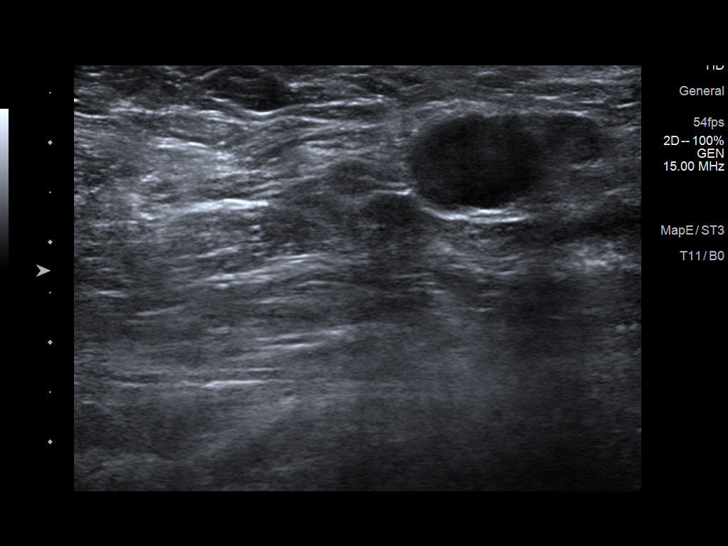
[im 2/10]
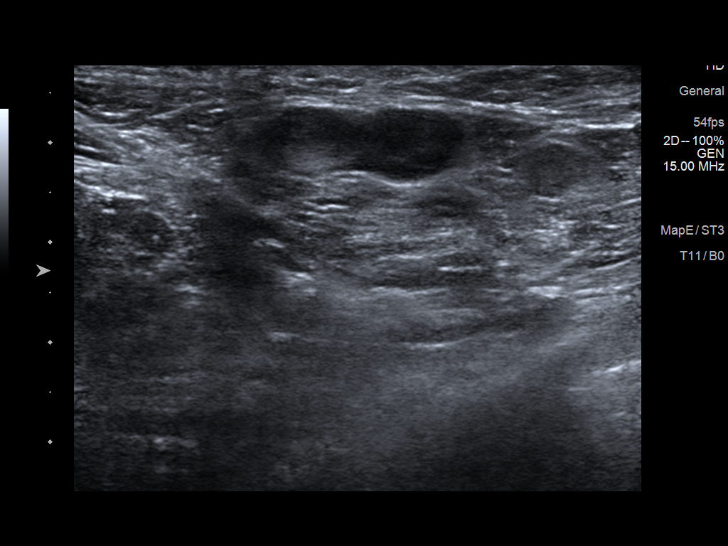
[im 3/10]
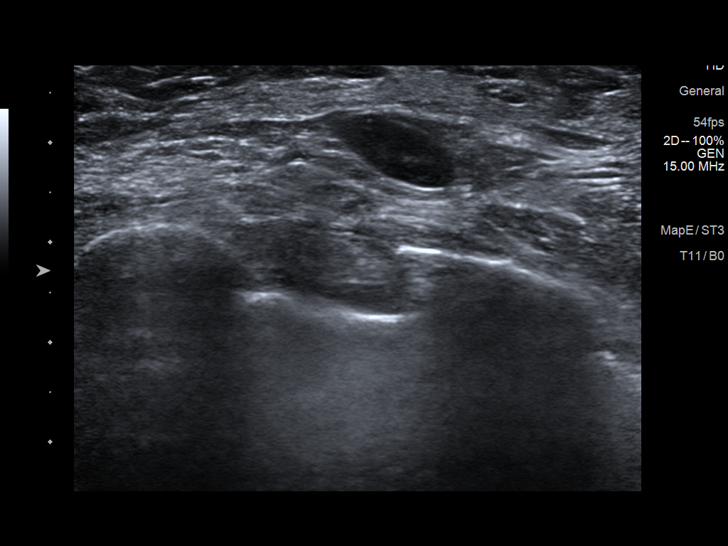
[im 4/10]
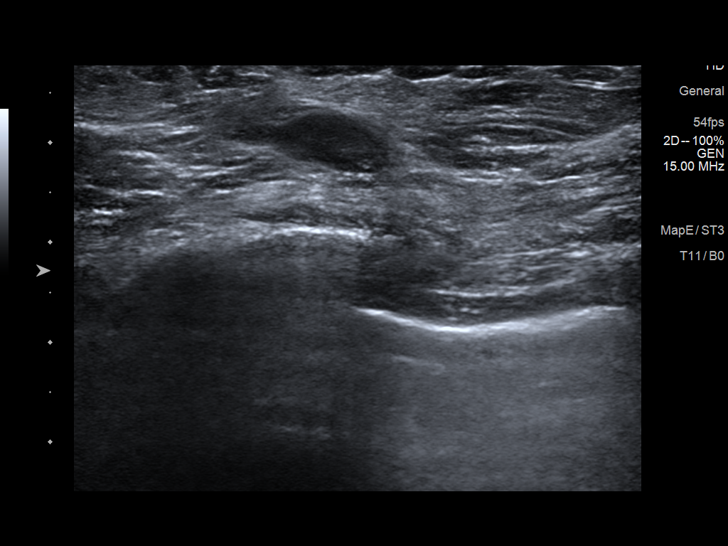
[im 5/10]
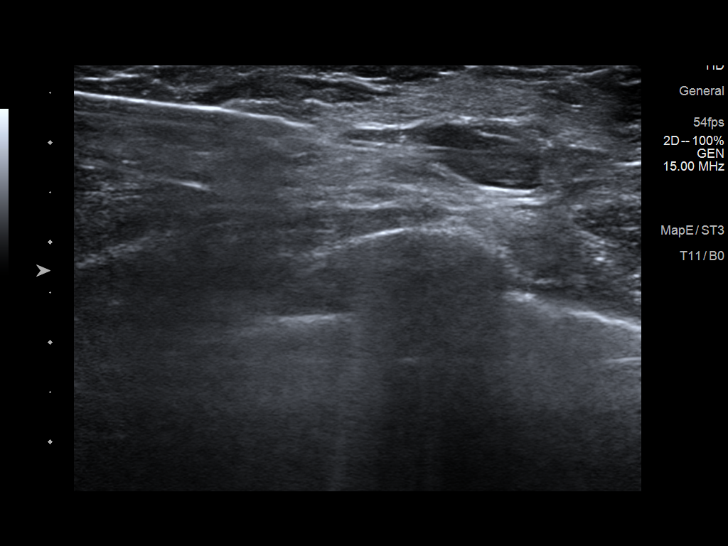
[im 6/10]
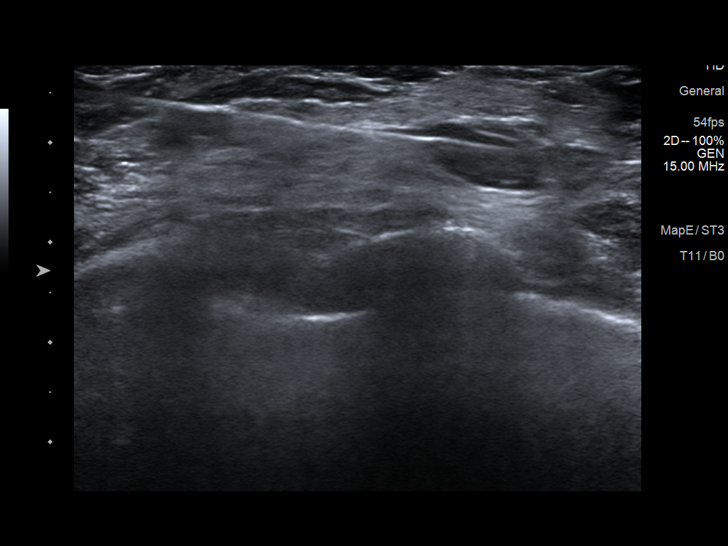
[im 7/10]
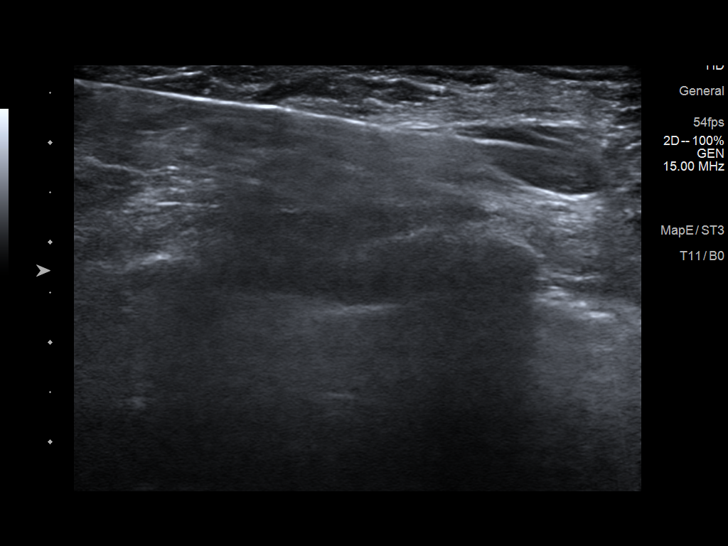
[im 8/10]
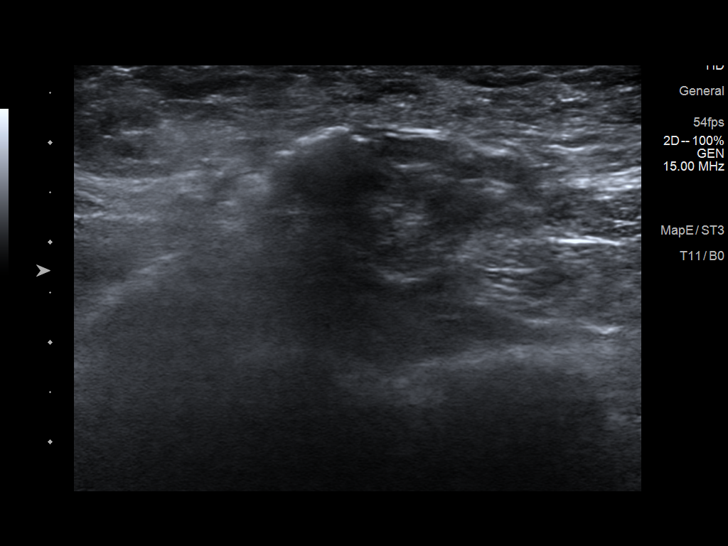
[im 9/10]
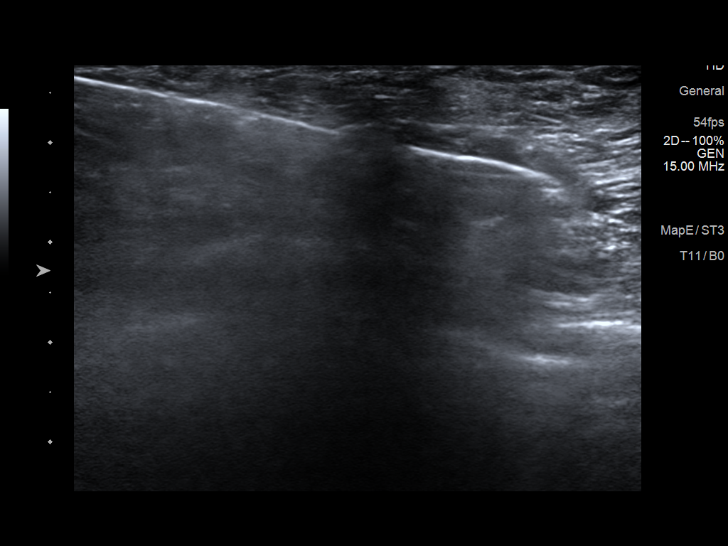
[im 10/10]
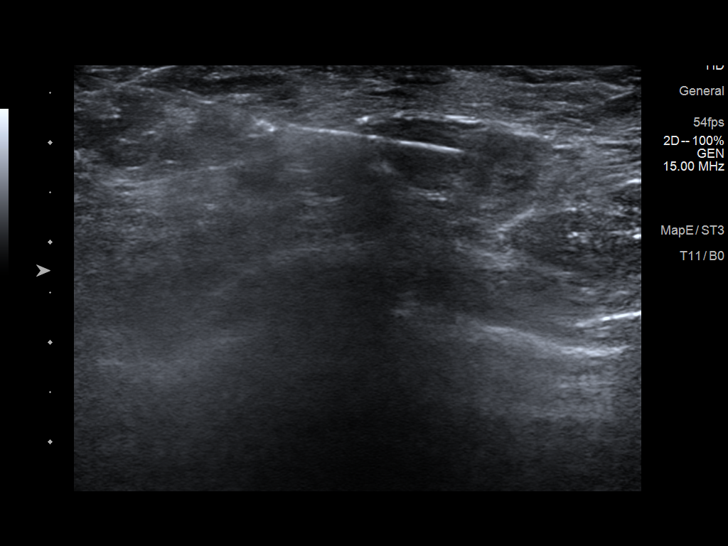

[10 of 10 positions shown; findings below may reference images not displayed]



Using sterile technique and 1% Lidocaine as local anesthetic, under
direct ultrasound visualization, a 14 gauge CREIGHTON device was
used to perform biopsy of an abnormal right axillary lymph node
using a lateral approach. At the conclusion of the procedure CREIGHTON
CREIGHTON tissue marker clip was deployed into the biopsy cavity. Follow
up 2 view mammogram was performed and dictated separately.

Using sterile technique and 1% Lidocaine as local anesthetic, under
direct ultrasound visualization, a 14 gauge CREIGHTON device was
used to perform biopsy of an abnormal left axillary lymph node using
a lateral approach. At the conclusion of the procedure CREIGHTON
tissue marker clip was deployed into the biopsy cavity. Follow up 2
view mammogram was performed and dictated separately.
IMPRESSION: Ultrasound guided biopsy of bilateral axillary adenopathy. No
apparent complications.

ADDENDUM:
Pathology revealed REACTIVE LYMPH NODE WITH PIGMENTED HISTIOCYTES of
the RIGHT axilla, (tribell clip). Flow cytometry was attempted;
however, there was insufficient material for analysis. The presence
of pigmented histiocytes raises the possibility of dermatopathic
lymphadenopathy. This was found to be concordant by Dr. CREIGHTON
CREIGHTON.

Pathology revealed REACTIVE LYMPH NODE WITH PIGMENTED HISTIOCYTES of
the LEFT axilla, (tribell clip). Flow cytometry was attempted;
however, there was insufficient material for analysis. The presence
of pigmented histiocytes raises the possibility of dermatopathic
lymphadenopathy. This was found to be concordant by Dr. CREIGHTON
CREIGHTON.

Pathology results will be discussed with the patient by Dr. CREIGHTON
CREIGHTON with [HOSPITAL] Internal Medicine. The patient was unable to
be reached via phone or text after multiple attempts on [REDACTED], 5th and 6th.

Dr. CREIGHTON was notified of biopsy results and recommendation by
phone on [DATE].

Dr. CREIGHTON proceed with clinical workup for further evaluation
of BILATERAL lymphadenopathy.

Pathology results reported by CREIGHTON, RN on [DATE].



Using sterile technique and 1% Lidocaine as local anesthetic, under
direct ultrasound visualization, a 14 gauge CREIGHTON device was
used to perform biopsy of an abnormal right axillary lymph node
using a lateral approach. At the conclusion of the procedure CREIGHTON
CREIGHTON tissue marker clip was deployed into the biopsy cavity. Follow
up 2 view mammogram was performed and dictated separately.

Using sterile technique and 1% Lidocaine as local anesthetic, under
direct ultrasound visualization, a 14 gauge CREIGHTON device was
used to perform biopsy of an abnormal left axillary lymph node using
a lateral approach. At the conclusion of the procedure CREIGHTON
tissue marker clip was deployed into the biopsy cavity. Follow up 2
view mammogram was performed and dictated separately.
IMPRESSION: Ultrasound guided biopsy of bilateral axillary adenopathy. No
apparent complications.

## 2021-07-04 IMAGING — MG MM BREAST LOCALIZATION CLIP
2 series · 3 of 6 positions shown · non-contrast
Comparison: Previous exam(s).

CLINICAL DATA: Evaluate bilateral biopsy markers

EXAM:
3D DIAGNOSTIC BILATERAL MAMMOGRAM POST ULTRASOUND BIOPSY

[R MLO synth-2D]
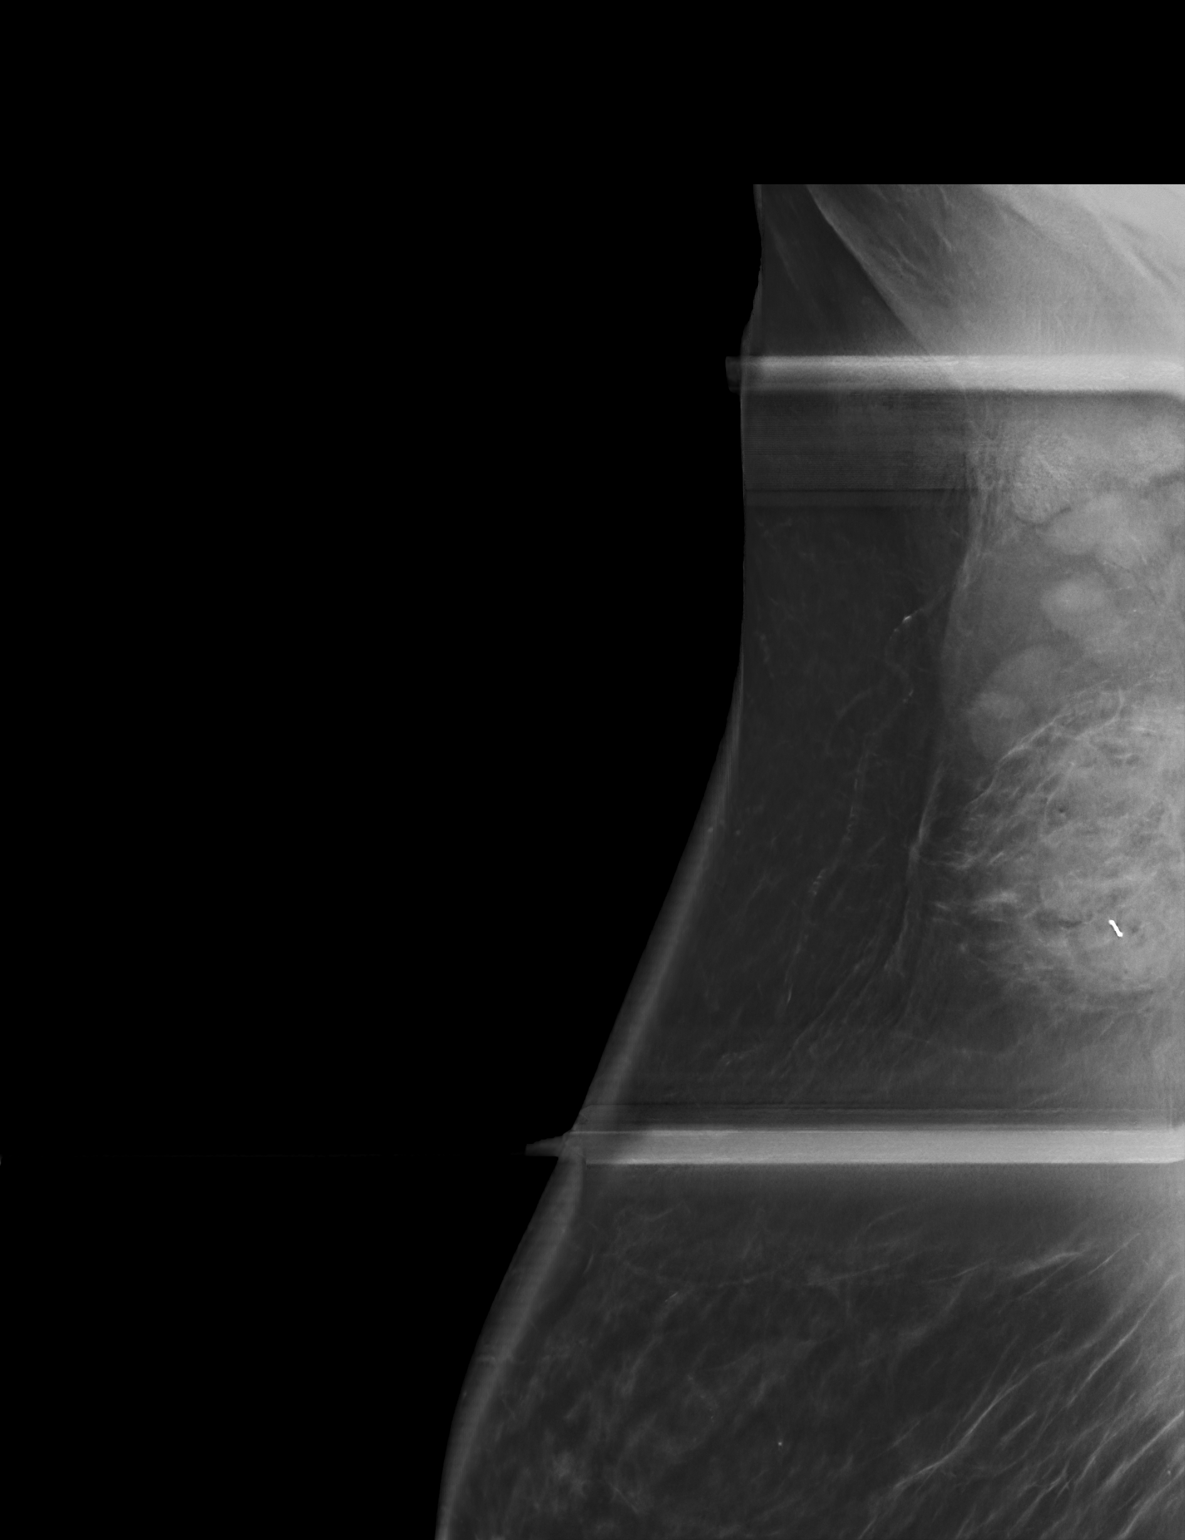

[R MLO tomo · 2 of 103 frames shown]
[frame 34/103]
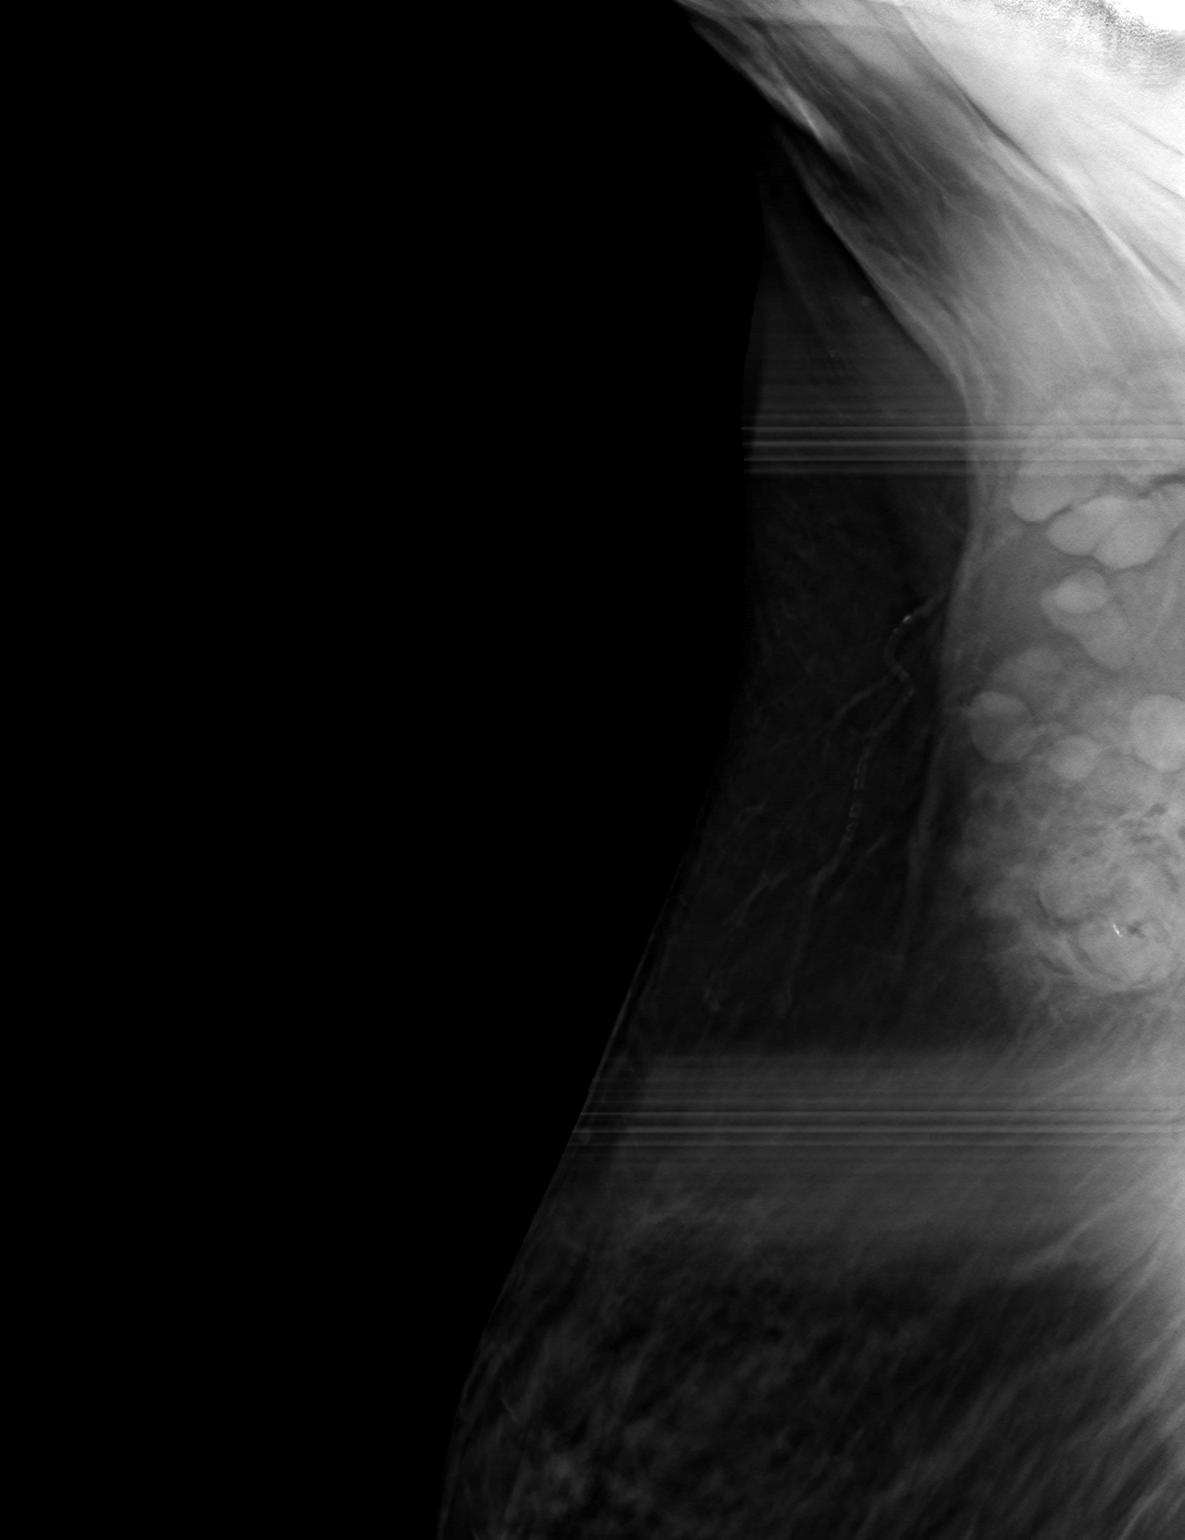
[frame 52/103]
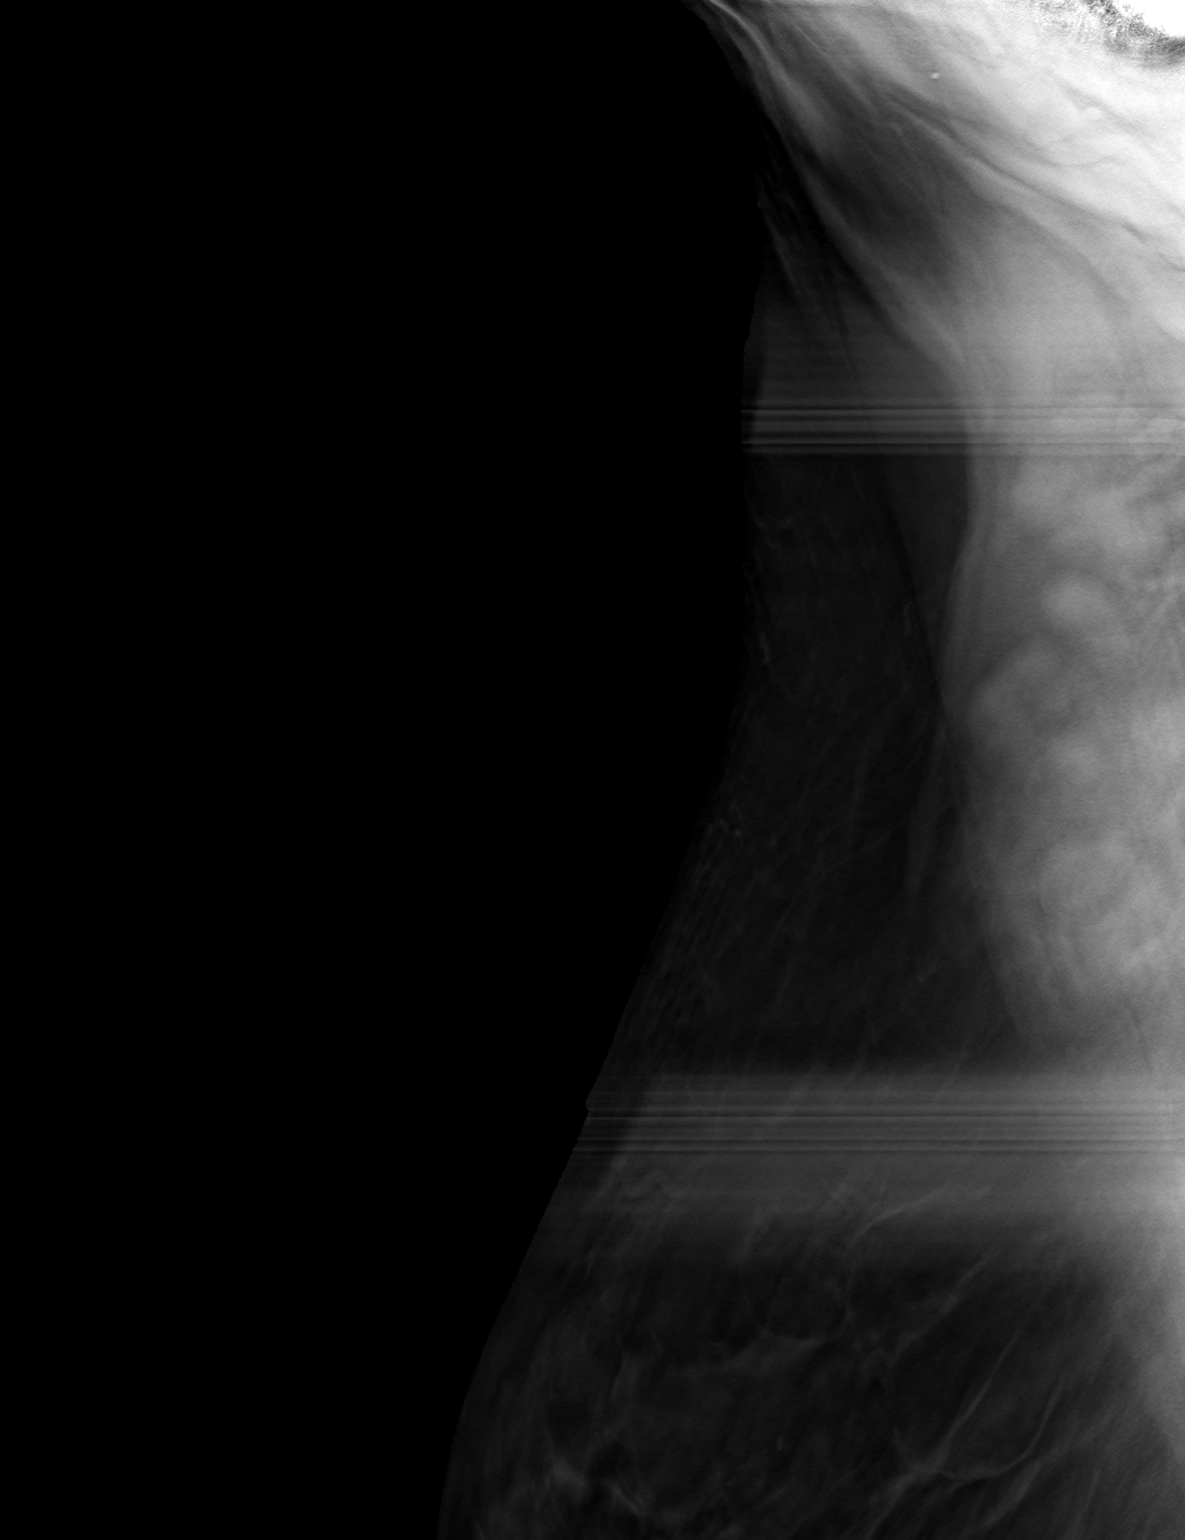

[3 of 6 positions shown; findings below may reference images not displayed]

FINDINGS: 3D Mammographic images were obtained following ultrasound guided
biopsy of bilateral axillary lymph nodes. NURIT markers were
placed bilaterally and are noted on today's mammogram within the
biopsied lymph nodes.
IMPRESSION: Bilateral NURIT markers are identified within the biopsied lymph
nodes.

Final Assessment: Post Procedure Mammograms for Marker Placement

## 2021-07-04 NOTE — Progress Notes (Signed)
Internal Medicine Clinic Attending  I saw and evaluated the patient.  I personally confirmed the key portions of the history and exam documented by Dr. Patel and I reviewed pertinent patient test results.  The assessment, diagnosis, and plan were formulated together and I agree with the documentation in the resident's note.  

## 2021-07-05 DIAGNOSIS — N2581 Secondary hyperparathyroidism of renal origin: Secondary | ICD-10-CM | POA: Diagnosis not present

## 2021-07-05 DIAGNOSIS — E1129 Type 2 diabetes mellitus with other diabetic kidney complication: Secondary | ICD-10-CM | POA: Diagnosis not present

## 2021-07-05 DIAGNOSIS — D689 Coagulation defect, unspecified: Secondary | ICD-10-CM | POA: Diagnosis not present

## 2021-07-05 DIAGNOSIS — N186 End stage renal disease: Secondary | ICD-10-CM | POA: Diagnosis not present

## 2021-07-05 DIAGNOSIS — Z992 Dependence on renal dialysis: Secondary | ICD-10-CM | POA: Diagnosis not present

## 2021-07-08 LAB — SURGICAL PATHOLOGY

## 2021-07-09 ENCOUNTER — Telehealth: Payer: Self-pay | Admitting: *Deleted

## 2021-07-09 NOTE — Telephone Encounter (Addendum)
Received call from Terie Purser, RN Nurse Navigator at the [facility], on behalf of Dr. Dorise Bullion. Patient was seen for bilateral axillary lymphadenopathy, and biopsies were done on right and left lymph nodes. States pathologies match imaging. They are recommending work up to find underlying cause of lymphadenopathy. Sula Soda has made multiple attempts to relay results to patient without success. Sula Soda may be reached at [phone] for any questions.

## 2021-07-10 NOTE — Telephone Encounter (Signed)
I talked to Terie Purser about patient's pathology result.  She was found to have morphological abnormal lymph node of bilateral axilla on mammogram 06/25/2021.  No evidence of malignancy within the left breast.  She underwent ultrasound-guided biopsies and sample was sent to pathology.  However the quantity is only sufficient for histology, which showed reactive lymph node bilaterally with no evidence of malignancy.  Flow cytometry was not performed due to insufficient quantity.  Low suspicion of leukemia with normal WBC in 05/23/2021.  We can repeat CBC for differential on her next follow-up. If unremarkable, will continue bilateral annual screening mammogram in 1 year. We can also discuss risk and benefit of doing excisional biopsy for further evaluation.  I have tried to contact patient via phone but unsuccessful.  Left voicemail to call back the clinic.  I will try to call again today.

## 2021-07-11 ENCOUNTER — Other Ambulatory Visit: Payer: Self-pay | Admitting: Student

## 2021-07-11 DIAGNOSIS — R599 Enlarged lymph nodes, unspecified: Secondary | ICD-10-CM

## 2021-08-05 DIAGNOSIS — E1129 Type 2 diabetes mellitus with other diabetic kidney complication: Secondary | ICD-10-CM | POA: Diagnosis not present

## 2021-08-05 DIAGNOSIS — N2581 Secondary hyperparathyroidism of renal origin: Secondary | ICD-10-CM | POA: Diagnosis not present

## 2021-08-05 DIAGNOSIS — L299 Pruritus, unspecified: Secondary | ICD-10-CM | POA: Diagnosis not present

## 2021-08-05 DIAGNOSIS — N186 End stage renal disease: Secondary | ICD-10-CM | POA: Diagnosis not present

## 2021-08-05 DIAGNOSIS — D509 Iron deficiency anemia, unspecified: Secondary | ICD-10-CM | POA: Diagnosis not present

## 2021-08-05 DIAGNOSIS — Z992 Dependence on renal dialysis: Secondary | ICD-10-CM | POA: Diagnosis not present

## 2021-08-06 ENCOUNTER — Encounter: Payer: Medicare Other | Admitting: Internal Medicine

## 2021-08-07 DIAGNOSIS — D509 Iron deficiency anemia, unspecified: Secondary | ICD-10-CM | POA: Diagnosis not present

## 2021-08-07 DIAGNOSIS — N186 End stage renal disease: Secondary | ICD-10-CM | POA: Diagnosis not present

## 2021-08-07 DIAGNOSIS — N2581 Secondary hyperparathyroidism of renal origin: Secondary | ICD-10-CM | POA: Diagnosis not present

## 2021-08-07 DIAGNOSIS — Z992 Dependence on renal dialysis: Secondary | ICD-10-CM | POA: Diagnosis not present

## 2021-08-07 DIAGNOSIS — E1129 Type 2 diabetes mellitus with other diabetic kidney complication: Secondary | ICD-10-CM | POA: Diagnosis not present

## 2021-08-07 DIAGNOSIS — L299 Pruritus, unspecified: Secondary | ICD-10-CM | POA: Diagnosis not present

## 2021-08-08 DIAGNOSIS — M6281 Muscle weakness (generalized): Secondary | ICD-10-CM | POA: Diagnosis not present

## 2021-08-08 DIAGNOSIS — M1712 Unilateral primary osteoarthritis, left knee: Secondary | ICD-10-CM | POA: Diagnosis not present

## 2021-08-08 DIAGNOSIS — R262 Difficulty in walking, not elsewhere classified: Secondary | ICD-10-CM | POA: Diagnosis not present

## 2021-08-08 DIAGNOSIS — M25562 Pain in left knee: Secondary | ICD-10-CM | POA: Diagnosis not present

## 2021-08-08 DIAGNOSIS — M1711 Unilateral primary osteoarthritis, right knee: Secondary | ICD-10-CM | POA: Diagnosis not present

## 2021-08-08 DIAGNOSIS — M25561 Pain in right knee: Secondary | ICD-10-CM | POA: Diagnosis not present

## 2021-08-09 DIAGNOSIS — D509 Iron deficiency anemia, unspecified: Secondary | ICD-10-CM | POA: Diagnosis not present

## 2021-08-09 DIAGNOSIS — L299 Pruritus, unspecified: Secondary | ICD-10-CM | POA: Diagnosis not present

## 2021-08-09 DIAGNOSIS — N2581 Secondary hyperparathyroidism of renal origin: Secondary | ICD-10-CM | POA: Diagnosis not present

## 2021-08-09 DIAGNOSIS — E1129 Type 2 diabetes mellitus with other diabetic kidney complication: Secondary | ICD-10-CM | POA: Diagnosis not present

## 2021-08-09 DIAGNOSIS — Z992 Dependence on renal dialysis: Secondary | ICD-10-CM | POA: Diagnosis not present

## 2021-08-09 DIAGNOSIS — N186 End stage renal disease: Secondary | ICD-10-CM | POA: Diagnosis not present

## 2021-08-12 DIAGNOSIS — L299 Pruritus, unspecified: Secondary | ICD-10-CM | POA: Diagnosis not present

## 2021-08-12 DIAGNOSIS — N2581 Secondary hyperparathyroidism of renal origin: Secondary | ICD-10-CM | POA: Diagnosis not present

## 2021-08-12 DIAGNOSIS — Z992 Dependence on renal dialysis: Secondary | ICD-10-CM | POA: Diagnosis not present

## 2021-08-12 DIAGNOSIS — N186 End stage renal disease: Secondary | ICD-10-CM | POA: Diagnosis not present

## 2021-08-12 DIAGNOSIS — E1129 Type 2 diabetes mellitus with other diabetic kidney complication: Secondary | ICD-10-CM | POA: Diagnosis not present

## 2021-08-12 DIAGNOSIS — D509 Iron deficiency anemia, unspecified: Secondary | ICD-10-CM | POA: Diagnosis not present

## 2021-08-14 DIAGNOSIS — M533 Sacrococcygeal disorders, not elsewhere classified: Secondary | ICD-10-CM | POA: Diagnosis not present

## 2021-08-14 DIAGNOSIS — E1129 Type 2 diabetes mellitus with other diabetic kidney complication: Secondary | ICD-10-CM | POA: Diagnosis not present

## 2021-08-14 DIAGNOSIS — N2581 Secondary hyperparathyroidism of renal origin: Secondary | ICD-10-CM | POA: Diagnosis not present

## 2021-08-14 DIAGNOSIS — L299 Pruritus, unspecified: Secondary | ICD-10-CM | POA: Diagnosis not present

## 2021-08-14 DIAGNOSIS — D509 Iron deficiency anemia, unspecified: Secondary | ICD-10-CM | POA: Diagnosis not present

## 2021-08-14 DIAGNOSIS — Z992 Dependence on renal dialysis: Secondary | ICD-10-CM | POA: Diagnosis not present

## 2021-08-14 DIAGNOSIS — N186 End stage renal disease: Secondary | ICD-10-CM | POA: Diagnosis not present

## 2021-08-16 DIAGNOSIS — E1129 Type 2 diabetes mellitus with other diabetic kidney complication: Secondary | ICD-10-CM | POA: Diagnosis not present

## 2021-08-16 DIAGNOSIS — Z992 Dependence on renal dialysis: Secondary | ICD-10-CM | POA: Diagnosis not present

## 2021-08-16 DIAGNOSIS — N186 End stage renal disease: Secondary | ICD-10-CM | POA: Diagnosis not present

## 2021-08-16 DIAGNOSIS — L299 Pruritus, unspecified: Secondary | ICD-10-CM | POA: Diagnosis not present

## 2021-08-16 DIAGNOSIS — N2581 Secondary hyperparathyroidism of renal origin: Secondary | ICD-10-CM | POA: Diagnosis not present

## 2021-08-16 DIAGNOSIS — D509 Iron deficiency anemia, unspecified: Secondary | ICD-10-CM | POA: Diagnosis not present

## 2021-08-19 DIAGNOSIS — N186 End stage renal disease: Secondary | ICD-10-CM | POA: Diagnosis not present

## 2021-08-19 DIAGNOSIS — E1129 Type 2 diabetes mellitus with other diabetic kidney complication: Secondary | ICD-10-CM | POA: Diagnosis not present

## 2021-08-19 DIAGNOSIS — L299 Pruritus, unspecified: Secondary | ICD-10-CM | POA: Diagnosis not present

## 2021-08-19 DIAGNOSIS — Z992 Dependence on renal dialysis: Secondary | ICD-10-CM | POA: Diagnosis not present

## 2021-08-19 DIAGNOSIS — N2581 Secondary hyperparathyroidism of renal origin: Secondary | ICD-10-CM | POA: Diagnosis not present

## 2021-08-19 DIAGNOSIS — D509 Iron deficiency anemia, unspecified: Secondary | ICD-10-CM | POA: Diagnosis not present

## 2021-08-20 ENCOUNTER — Encounter (INDEPENDENT_AMBULATORY_CARE_PROVIDER_SITE_OTHER): Payer: Medicare Other

## 2021-08-20 ENCOUNTER — Encounter (INDEPENDENT_AMBULATORY_CARE_PROVIDER_SITE_OTHER): Payer: Self-pay

## 2021-08-20 ENCOUNTER — Other Ambulatory Visit (INDEPENDENT_AMBULATORY_CARE_PROVIDER_SITE_OTHER): Payer: Self-pay | Admitting: Nurse Practitioner

## 2021-08-20 ENCOUNTER — Ambulatory Visit (INDEPENDENT_AMBULATORY_CARE_PROVIDER_SITE_OTHER): Payer: Medicare Other | Admitting: Vascular Surgery

## 2021-08-20 DIAGNOSIS — M5416 Radiculopathy, lumbar region: Secondary | ICD-10-CM | POA: Diagnosis not present

## 2021-08-20 DIAGNOSIS — N186 End stage renal disease: Secondary | ICD-10-CM

## 2021-08-21 DIAGNOSIS — Z992 Dependence on renal dialysis: Secondary | ICD-10-CM | POA: Diagnosis not present

## 2021-08-21 DIAGNOSIS — E1129 Type 2 diabetes mellitus with other diabetic kidney complication: Secondary | ICD-10-CM | POA: Diagnosis not present

## 2021-08-21 DIAGNOSIS — N2581 Secondary hyperparathyroidism of renal origin: Secondary | ICD-10-CM | POA: Diagnosis not present

## 2021-08-21 DIAGNOSIS — L299 Pruritus, unspecified: Secondary | ICD-10-CM | POA: Diagnosis not present

## 2021-08-21 DIAGNOSIS — D509 Iron deficiency anemia, unspecified: Secondary | ICD-10-CM | POA: Diagnosis not present

## 2021-08-21 DIAGNOSIS — N186 End stage renal disease: Secondary | ICD-10-CM | POA: Diagnosis not present

## 2021-08-23 DIAGNOSIS — Z992 Dependence on renal dialysis: Secondary | ICD-10-CM | POA: Diagnosis not present

## 2021-08-23 DIAGNOSIS — D509 Iron deficiency anemia, unspecified: Secondary | ICD-10-CM | POA: Diagnosis not present

## 2021-08-23 DIAGNOSIS — N186 End stage renal disease: Secondary | ICD-10-CM | POA: Diagnosis not present

## 2021-08-23 DIAGNOSIS — N2581 Secondary hyperparathyroidism of renal origin: Secondary | ICD-10-CM | POA: Diagnosis not present

## 2021-08-23 DIAGNOSIS — E1129 Type 2 diabetes mellitus with other diabetic kidney complication: Secondary | ICD-10-CM | POA: Diagnosis not present

## 2021-08-23 DIAGNOSIS — L299 Pruritus, unspecified: Secondary | ICD-10-CM | POA: Diagnosis not present

## 2021-08-26 DIAGNOSIS — Z992 Dependence on renal dialysis: Secondary | ICD-10-CM | POA: Diagnosis not present

## 2021-08-26 DIAGNOSIS — N186 End stage renal disease: Secondary | ICD-10-CM | POA: Diagnosis not present

## 2021-08-26 DIAGNOSIS — D509 Iron deficiency anemia, unspecified: Secondary | ICD-10-CM | POA: Diagnosis not present

## 2021-08-26 DIAGNOSIS — N2581 Secondary hyperparathyroidism of renal origin: Secondary | ICD-10-CM | POA: Diagnosis not present

## 2021-08-26 DIAGNOSIS — E1129 Type 2 diabetes mellitus with other diabetic kidney complication: Secondary | ICD-10-CM | POA: Diagnosis not present

## 2021-08-26 DIAGNOSIS — L299 Pruritus, unspecified: Secondary | ICD-10-CM | POA: Diagnosis not present

## 2021-08-28 DIAGNOSIS — N2581 Secondary hyperparathyroidism of renal origin: Secondary | ICD-10-CM | POA: Diagnosis not present

## 2021-08-28 DIAGNOSIS — Z992 Dependence on renal dialysis: Secondary | ICD-10-CM | POA: Diagnosis not present

## 2021-08-28 DIAGNOSIS — L299 Pruritus, unspecified: Secondary | ICD-10-CM | POA: Diagnosis not present

## 2021-08-28 DIAGNOSIS — E1129 Type 2 diabetes mellitus with other diabetic kidney complication: Secondary | ICD-10-CM | POA: Diagnosis not present

## 2021-08-28 DIAGNOSIS — D509 Iron deficiency anemia, unspecified: Secondary | ICD-10-CM | POA: Diagnosis not present

## 2021-08-28 DIAGNOSIS — N186 End stage renal disease: Secondary | ICD-10-CM | POA: Diagnosis not present

## 2021-08-30 DIAGNOSIS — L299 Pruritus, unspecified: Secondary | ICD-10-CM | POA: Diagnosis not present

## 2021-08-30 DIAGNOSIS — D509 Iron deficiency anemia, unspecified: Secondary | ICD-10-CM | POA: Diagnosis not present

## 2021-08-30 DIAGNOSIS — E1129 Type 2 diabetes mellitus with other diabetic kidney complication: Secondary | ICD-10-CM | POA: Diagnosis not present

## 2021-08-30 DIAGNOSIS — Z992 Dependence on renal dialysis: Secondary | ICD-10-CM | POA: Diagnosis not present

## 2021-08-30 DIAGNOSIS — N2581 Secondary hyperparathyroidism of renal origin: Secondary | ICD-10-CM | POA: Diagnosis not present

## 2021-08-30 DIAGNOSIS — N186 End stage renal disease: Secondary | ICD-10-CM | POA: Diagnosis not present

## 2021-09-02 DIAGNOSIS — Z992 Dependence on renal dialysis: Secondary | ICD-10-CM | POA: Diagnosis not present

## 2021-09-02 DIAGNOSIS — L299 Pruritus, unspecified: Secondary | ICD-10-CM | POA: Diagnosis not present

## 2021-09-02 DIAGNOSIS — N186 End stage renal disease: Secondary | ICD-10-CM | POA: Diagnosis not present

## 2021-09-02 DIAGNOSIS — D509 Iron deficiency anemia, unspecified: Secondary | ICD-10-CM | POA: Diagnosis not present

## 2021-09-02 DIAGNOSIS — E1129 Type 2 diabetes mellitus with other diabetic kidney complication: Secondary | ICD-10-CM | POA: Diagnosis not present

## 2021-09-02 DIAGNOSIS — N2581 Secondary hyperparathyroidism of renal origin: Secondary | ICD-10-CM | POA: Diagnosis not present

## 2021-09-03 DIAGNOSIS — N186 End stage renal disease: Secondary | ICD-10-CM | POA: Diagnosis not present

## 2021-09-03 DIAGNOSIS — Z992 Dependence on renal dialysis: Secondary | ICD-10-CM | POA: Diagnosis not present

## 2021-09-03 DIAGNOSIS — I129 Hypertensive chronic kidney disease with stage 1 through stage 4 chronic kidney disease, or unspecified chronic kidney disease: Secondary | ICD-10-CM | POA: Diagnosis not present

## 2021-09-04 DIAGNOSIS — Z992 Dependence on renal dialysis: Secondary | ICD-10-CM | POA: Diagnosis not present

## 2021-09-04 DIAGNOSIS — N186 End stage renal disease: Secondary | ICD-10-CM | POA: Diagnosis not present

## 2021-09-04 DIAGNOSIS — D509 Iron deficiency anemia, unspecified: Secondary | ICD-10-CM | POA: Diagnosis not present

## 2021-09-04 DIAGNOSIS — M5416 Radiculopathy, lumbar region: Secondary | ICD-10-CM | POA: Diagnosis not present

## 2021-09-04 DIAGNOSIS — N2581 Secondary hyperparathyroidism of renal origin: Secondary | ICD-10-CM | POA: Diagnosis not present

## 2021-09-06 DIAGNOSIS — N186 End stage renal disease: Secondary | ICD-10-CM | POA: Diagnosis not present

## 2021-09-06 DIAGNOSIS — D509 Iron deficiency anemia, unspecified: Secondary | ICD-10-CM | POA: Diagnosis not present

## 2021-09-06 DIAGNOSIS — Z992 Dependence on renal dialysis: Secondary | ICD-10-CM | POA: Diagnosis not present

## 2021-09-06 DIAGNOSIS — N2581 Secondary hyperparathyroidism of renal origin: Secondary | ICD-10-CM | POA: Diagnosis not present

## 2021-09-09 DIAGNOSIS — D509 Iron deficiency anemia, unspecified: Secondary | ICD-10-CM | POA: Diagnosis not present

## 2021-09-09 DIAGNOSIS — N186 End stage renal disease: Secondary | ICD-10-CM | POA: Diagnosis not present

## 2021-09-09 DIAGNOSIS — N2581 Secondary hyperparathyroidism of renal origin: Secondary | ICD-10-CM | POA: Diagnosis not present

## 2021-09-09 DIAGNOSIS — Z992 Dependence on renal dialysis: Secondary | ICD-10-CM | POA: Diagnosis not present

## 2021-09-11 DIAGNOSIS — N186 End stage renal disease: Secondary | ICD-10-CM | POA: Diagnosis not present

## 2021-09-11 DIAGNOSIS — N2581 Secondary hyperparathyroidism of renal origin: Secondary | ICD-10-CM | POA: Diagnosis not present

## 2021-09-11 DIAGNOSIS — Z992 Dependence on renal dialysis: Secondary | ICD-10-CM | POA: Diagnosis not present

## 2021-09-11 DIAGNOSIS — D509 Iron deficiency anemia, unspecified: Secondary | ICD-10-CM | POA: Diagnosis not present

## 2021-09-13 DIAGNOSIS — D509 Iron deficiency anemia, unspecified: Secondary | ICD-10-CM | POA: Diagnosis not present

## 2021-09-13 DIAGNOSIS — N2581 Secondary hyperparathyroidism of renal origin: Secondary | ICD-10-CM | POA: Diagnosis not present

## 2021-09-13 DIAGNOSIS — N186 End stage renal disease: Secondary | ICD-10-CM | POA: Diagnosis not present

## 2021-09-13 DIAGNOSIS — Z992 Dependence on renal dialysis: Secondary | ICD-10-CM | POA: Diagnosis not present

## 2021-09-16 DIAGNOSIS — N186 End stage renal disease: Secondary | ICD-10-CM | POA: Diagnosis not present

## 2021-09-16 DIAGNOSIS — Z992 Dependence on renal dialysis: Secondary | ICD-10-CM | POA: Diagnosis not present

## 2021-09-16 DIAGNOSIS — D509 Iron deficiency anemia, unspecified: Secondary | ICD-10-CM | POA: Diagnosis not present

## 2021-09-16 DIAGNOSIS — N2581 Secondary hyperparathyroidism of renal origin: Secondary | ICD-10-CM | POA: Diagnosis not present

## 2021-09-18 DIAGNOSIS — N186 End stage renal disease: Secondary | ICD-10-CM | POA: Diagnosis not present

## 2021-09-18 DIAGNOSIS — N2581 Secondary hyperparathyroidism of renal origin: Secondary | ICD-10-CM | POA: Diagnosis not present

## 2021-09-18 DIAGNOSIS — Z992 Dependence on renal dialysis: Secondary | ICD-10-CM | POA: Diagnosis not present

## 2021-09-18 DIAGNOSIS — D509 Iron deficiency anemia, unspecified: Secondary | ICD-10-CM | POA: Diagnosis not present

## 2021-09-19 ENCOUNTER — Other Ambulatory Visit: Payer: Self-pay | Admitting: *Deleted

## 2021-09-19 ENCOUNTER — Other Ambulatory Visit (INDEPENDENT_AMBULATORY_CARE_PROVIDER_SITE_OTHER): Payer: Medicare Other

## 2021-09-19 DIAGNOSIS — R599 Enlarged lymph nodes, unspecified: Secondary | ICD-10-CM | POA: Diagnosis not present

## 2021-09-20 DIAGNOSIS — Z992 Dependence on renal dialysis: Secondary | ICD-10-CM | POA: Diagnosis not present

## 2021-09-20 DIAGNOSIS — N2581 Secondary hyperparathyroidism of renal origin: Secondary | ICD-10-CM | POA: Diagnosis not present

## 2021-09-20 DIAGNOSIS — N186 End stage renal disease: Secondary | ICD-10-CM | POA: Diagnosis not present

## 2021-09-20 DIAGNOSIS — D509 Iron deficiency anemia, unspecified: Secondary | ICD-10-CM | POA: Diagnosis not present

## 2021-09-20 LAB — CBC WITH DIFFERENTIAL/PLATELET
Basophils Absolute: 0 10*3/uL (ref 0.0–0.2)
Basos: 0 %
EOS (ABSOLUTE): 0.2 10*3/uL (ref 0.0–0.4)
Eos: 3 %
Hematocrit: 26.9 % — ABNORMAL LOW (ref 34.0–46.6)
Hemoglobin: 8.9 g/dL — ABNORMAL LOW (ref 11.1–15.9)
Immature Grans (Abs): 0 10*3/uL (ref 0.0–0.1)
Immature Granulocytes: 1 %
Lymphocytes Absolute: 1.6 10*3/uL (ref 0.7–3.1)
Lymphs: 25 %
MCH: 30 pg (ref 26.6–33.0)
MCHC: 33.1 g/dL (ref 31.5–35.7)
MCV: 91 fL (ref 79–97)
Monocytes Absolute: 0.8 10*3/uL (ref 0.1–0.9)
Monocytes: 13 %
Neutrophils Absolute: 3.7 10*3/uL (ref 1.4–7.0)
Neutrophils: 58 %
Platelets: 175 10*3/uL (ref 150–450)
RBC: 2.97 x10E6/uL — ABNORMAL LOW (ref 3.77–5.28)
RDW: 17.8 % — ABNORMAL HIGH (ref 11.7–15.4)
WBC: 6.4 10*3/uL (ref 3.4–10.8)

## 2021-09-23 DIAGNOSIS — N186 End stage renal disease: Secondary | ICD-10-CM | POA: Diagnosis not present

## 2021-09-23 DIAGNOSIS — D509 Iron deficiency anemia, unspecified: Secondary | ICD-10-CM | POA: Diagnosis not present

## 2021-09-23 DIAGNOSIS — N2581 Secondary hyperparathyroidism of renal origin: Secondary | ICD-10-CM | POA: Diagnosis not present

## 2021-09-23 DIAGNOSIS — Z992 Dependence on renal dialysis: Secondary | ICD-10-CM | POA: Diagnosis not present

## 2021-09-24 ENCOUNTER — Other Ambulatory Visit: Payer: Self-pay

## 2021-09-24 ENCOUNTER — Ambulatory Visit (INDEPENDENT_AMBULATORY_CARE_PROVIDER_SITE_OTHER): Payer: Medicare Other | Admitting: Vascular Surgery

## 2021-09-24 ENCOUNTER — Ambulatory Visit (INDEPENDENT_AMBULATORY_CARE_PROVIDER_SITE_OTHER): Payer: Medicare Other

## 2021-09-24 ENCOUNTER — Encounter (INDEPENDENT_AMBULATORY_CARE_PROVIDER_SITE_OTHER): Payer: Self-pay | Admitting: Vascular Surgery

## 2021-09-24 VITALS — BP 173/93 | HR 103 | Resp 16 | Wt 159.8 lb

## 2021-09-24 DIAGNOSIS — Z992 Dependence on renal dialysis: Secondary | ICD-10-CM

## 2021-09-24 DIAGNOSIS — N186 End stage renal disease: Secondary | ICD-10-CM

## 2021-09-24 DIAGNOSIS — I10 Essential (primary) hypertension: Secondary | ICD-10-CM

## 2021-09-24 NOTE — Progress Notes (Signed)
MRN : [id number]  Ruth Gutierrez is a 56 y.o. (08-Nov-1965) female who presents with chief complaint of  Chief Complaint  Patient presents with   Follow-up    Ultrasound follow up  .  History of Present Illness: Patient returns today in follow up of her dialysis access.  She is doing well today.  She says they are using her access without significant problems.  They are doing a better job of rotating her access sites and not sticking the thinned skin overlying the aneurysmal access sites nearly as much.  No prolonged bleeding.  No skin breakdown.  No pain. ABIs today are significantly improved bilaterally up to 0.75 on the right and 0.70 on the left.  Duplex shows the access to be widely patent without significant stenosis.  Current Outpatient Medications  Medication Sig Dispense Refill   amLODipine (NORVASC) 10 MG tablet Take 1 tablet (10 mg total) by mouth daily. 90 tablet 2   aspirin 81 MG EC tablet Take 81 mg by mouth as needed for pain.     carvedilol (COREG) 12.5 MG tablet Take 1 tablet (12.5 mg total) by mouth 2 (two) times daily with a meal. 180 tablet 3   cholecalciferol (VITAMIN D3) 25 MCG (1000 UNIT) tablet Take 1,000 Units by mouth daily.     omeprazole (PRILOSEC OTC) 20 MG tablet Take 20 mg by mouth daily as needed for heartburn.     polyethylene glycol (MIRALAX / GLYCOLAX) 17 g packet Take 17 g by mouth daily. 30 each 0   sertraline (ZOLOFT) 50 MG tablet Take 1 tablet (50 mg total) by mouth daily. 90 tablet 2   cloNIDine (CATAPRES) 0.1 MG tablet Take 1 tablet (0.1 mg total) by mouth 2 (two) times daily. 60 tablet 2   dexamethasone (DECADRON) 1 MG tablet Take 1 tablet (1 mg total) by mouth every 12 (twelve) hours. Take 1 tablet twice daily then reduce to 1 tablet daily on 11/11/2020 for 3 more days. (Patient not taking: Reported on 04/01/2021) 5 tablet 0   levETIRAcetam (KEPPRA) 250 MG tablet Take 1 tablet (250 mg total) by mouth 2 (two) times daily for 9 days. 18 tablet 0    lidocaine-prilocaine (EMLA) cream Apply 1 application topically every Monday, Wednesday, and Friday with hemodialysis. (Patient not taking: Reported on 04/01/2021)     No current facility-administered medications for this visit.    Past Medical History:  Diagnosis Date   Anemia of chronic disease    Arthritis    Deceased-donor kidney transplant    Performed at [facility] April 2010.  Initial ESRD due to HTN nephropathy   Eczema    ESRD (end stage renal disease) (HCC)    s/p transplant creatinine baseline 1.1  M/W/F dialysis   FUO (fever of unknown origin) 05/17/2015   GERD (gastroesophageal reflux disease)    Headache(784.0)    History of hyperparathyroidism    Hypertension    Peritonitis ([place]) 10/2019   Shortness of breath    Wears glasses     Past Surgical History:  Procedure Laterality Date   A/V FISTULAGRAM Left 02/12/2017   Procedure: A/V Fistulagram;  Surgeon: Algernon Huxley, MD;  Location: [place] [facility];  Service: Cardiovascular;  Laterality: Left;   A/V FISTULAGRAM Left 12/30/2017   Procedure: A/V FISTULAGRAM;  Surgeon: Algernon Huxley, MD;  Location: [place] [facility];  Service: Cardiovascular;  Laterality: Left;   A/V FISTULAGRAM Left 04/01/2021   Procedure: A/V FISTULAGRAM;  Surgeon:  Algernon Huxley, MD;  Location: [place] [facility];  Service: Cardiovascular;  Laterality: Left;   A/V SHUNT INTERVENTION N/A 02/12/2017   Procedure: A/V Shunt Intervention;  Surgeon: Algernon Huxley, MD;  Location: [place] [facility];  Service: Cardiovascular;  Laterality: N/A;   AV FISTULA PLACEMENT     BASCILIC VEIN TRANSPOSITION Left 10/30/2014   Procedure: LEFT [place];  Surgeon: Rosetta Posner, MD;  Location: [place];  Service: Vascular;  Laterality: Left;   [place] Left 01/03/2015   Procedure: LEFT ARM 2ND STAGE [place];  Surgeon: Rosetta Posner, MD;  Location: [place];  Service: Vascular;  Laterality: Left;   BREAST  BIOPSY Left    Patient doesn't remember any information from previous procedure.   CAPD REMOVAL N/A 10/31/2019   Procedure: PERITONEAL DIALYSIS  (CAPD) INFECTED CATHETER REMOVAL;  Surgeon: Coralie Keens, MD;  Location: [place];  Service: General;  Laterality: N/A;   CRANIOTOMY N/A 11/01/2020   Procedure: CRANIOTOMY FOR TUMOR EXCISION;  Surgeon: [name], MD;  Location: [place];  Service: Neurosurgery;  Laterality: N/A;   FRACTURE SURGERY     left foot,baby toe nad next toe missing   INSERTION OF DIALYSIS CATHETER Right 10/30/2014   Procedure: INSERTION OF DIALYSIS CATHETER;  Surgeon: Rosetta Posner, MD;  Location: [place];  Service: Vascular;  Laterality: Right;   KIDNEY TRANSPLANT  11/02/2008   [facility])   PLACEMENT OF LUMBAR DRAIN N/A 11/01/2020   Procedure: PLACEMENT OF LUMBAR DRAIN;  Surgeon: [name], MD;  Location: [place];  Service: Neurosurgery;  Laterality: N/A;   WISDOM TOOTH EXTRACTION       Social History   Tobacco Use   Smoking status: Every Day    Packs/day: 0.50    Years: 27.00    Pack years: 13.50    Types: Cigarettes   Smokeless tobacco: Never   Tobacco comments:    10 cigarettes a day  Vaping Use   Vaping Use: Never used  Substance Use Topics   Alcohol use: No    Alcohol/week: 0.0 standard drinks    Comment: occasional drinker noted in the past   Drug use: No      Family History  Problem Relation Age of Onset   Hypertension Mother    Hypertension Father    Hypertension Sister    Hyperlipidemia Sister    Hypertension Sister    Diabetes Brother    Deep vein thrombosis Brother    Kidney disease Brother        on HD   Colon cancer Neg Hx    Esophageal cancer Neg Hx    Rectal cancer Neg Hx    Breast cancer Neg Hx      Allergies  Allergen Reactions   Penicillin G Itching and Rash   Penicillins Itching and Rash    Did it involve swelling of the face/tongue/throat, SOB, or low BP?Y Did it involve sudden or severe  rash/hives, skin peeling, or any reaction on the inside of your mouth or nose? Y Did you need to seek medical attention at a hospital or doctor's office? Y When did it last happen?  2016     If all above answers are NO, may proceed with cephalosporin use.     REVIEW OF SYSTEMS (Negative unless checked)  Constitutional: '[]' Weight loss  '[]' Fever  '[]' Chills Cardiac: '[]' Chest pain   '[]' Chest pressure   '[]' Palpitations   '[]' Shortness of breath when laying  flat   '[]' Shortness of breath at rest   '[]' Shortness of breath with exertion. Vascular:  '[]' Pain in legs with walking   '[]' Pain in legs at rest   '[]' Pain in legs when laying flat   '[]' Claudication   '[]' Pain in feet when walking  '[]' Pain in feet at rest  '[]' Pain in feet when laying flat   '[]' History of DVT   '[]' Phlebitis   '[]' Swelling in legs   '[]' Varicose veins   '[]' Non-healing ulcers Pulmonary:   '[]' Uses home oxygen   '[]' Productive cough   '[]' Hemoptysis   '[]' Wheeze  '[]' COPD   '[]' Asthma Neurologic:  '[]' Dizziness  '[]' Blackouts   '[]' Seizures   '[]' History of stroke   '[]' History of TIA  '[]' Aphasia   '[]' Temporary blindness   '[]' Dysphagia   '[]' Weakness or numbness in arms   '[]' Weakness or numbness in legs Musculoskeletal:  '[x]' Arthritis   '[]' Joint swelling   '[]' Joint pain   '[]' Low back pain Hematologic:  '[]' Easy bruising  '[]' Easy bleeding   '[]' Hypercoagulable state   '[x]' Anemic   Gastrointestinal:  '[]' Blood in stool   '[]' Vomiting blood  '[]' Gastroesophageal reflux/heartburn   '[]' Abdominal pain Genitourinary:  '[x]' Chronic kidney disease   '[]' Difficult urination  '[]' Frequent urination  '[]' Burning with urination   '[]' Hematuria Skin:  '[]' Rashes   '[]' Ulcers   '[]' Wounds Psychological:  '[]' History of anxiety   '[x]'  History of major depression.  Physical Examination  BP (!) 173/93 (BP Location: Right Arm)    Pulse (!) 103    Resp 16    Wt 159 lb 12.8 oz (72.5 kg)    LMP 06/02/2015    BMI 25.03 kg/m  Gen:  WD/WN, NAD Head: [place]/AT, No temporalis wasting. Ear/Nose/Throat: Hearing grossly intact, nares w/o erythema or  drainage Eyes: Conjunctiva clear. Sclera non-icteric Neck: Supple.  Trachea midline Pulmonary:  Good air movement, no use of accessory muscles.  Cardiac: RRR, no JVD Vascular: Aneurysmal access sites with thinning of the skin but no impending rupture and no wounds overlying the access sites.  Good thrill in the AV fistula in the left upper arm. Vessel Right Left  Radial Palpable Palpable           Musculoskeletal: M/S 5/5 throughout.  No deformity or atrophy. No edema. Neurologic: Sensation grossly intact in extremities.  Symmetrical.  Speech is fluent.  Psychiatric: Judgment intact, Mood & affect appropriate for pt's clinical situation. Dermatologic: No rashes or ulcers noted.  No cellulitis or open wounds.      Labs Recent Results (from the past 2160 hour(s))  Surgical pathology     Status: None   Collection Time: 07/04/21 12:00 AM  Result Value Ref Range   SURGICAL PATHOLOGY      Surgical Pathology CASE: [id number] PATIENT: Louellen Molder Flow Pathology Report     Clinical history: adenopathy Site #1 (right axilla)     DIAGNOSIS:  -  Quantity insufficient for analysis -  See comment  COMMENT:  Please see concurrent lymph node biopsy ([id number]) for final diagnosis.  GATING AND PHENOTYPIC ANALYSIS:  Gated population: Flow cytometric immunophenotyping is not performed using antibodies to the antigens listed in the table below. Electronic gates are not placed around a cell cluster displaying light scatter properties corresponding to: lymphocytes due to insufficient quantity of cells in sample submitted for analysis.  Abnormal Cells in gated population: N/A  Phenotype of Abnormal Cells: N/A                       Lymphoid Antigens  Myeloid Antigens Miscellaneous CD2  ND   CD10 ND   CD11b     ND   CD45 ND CD3  ND   CD19 ND   CD11c     ND   HLA-Dr    ND CD4  ND   CD20 ND   CD13 ND   CD34 ND CD5  ND   CD22 ND   CD1 4 ND   CD38 ND CD7  ND    CD79b     ND   CD15 ND   CD138     ND CD8  ND   CD103     ND   CD16 ND   TdT  ND CD25 ND   CD200     ND   CD33 ND   CD123     ND TCRab     ND   sKappa    ND   CD64 ND   CD41 ND TCRgd     ND   sLambda   ND   CD117     ND   CD61 ND CD56 ND   cKappa    ND   MPO  ND   CD71 ND CD57 ND   cLambda   ND        CD235aND   GROSS DESCRIPTION:  Reference tissue case [id number].    Final Diagnosis performed by Thressa Sheller, MD.   Electronically signed 07/08/2021 Technical component performed at [facility], [place] [address], [place], [place] [address].  Professional component performed at [employer]. [facility], [place] [address] [place], [place] [address].   Surgical pathology     Status: None   Collection Time: 07/04/21 12:00 AM  Result Value Ref Range   SURGICAL PATHOLOGY      Surgical Pathology CASE: [id number] PATIENT: Louellen Molder Flow Pathology Report     Clinical history: adenopathy, site 2 Left axilla     DIAGNOSIS:  -  Quantity insufficient for analysis -  See comment  COMMENT:  Please see concurrent lymph node biopsy ([id number]) for final diagnosis.   GATING AND PHENOTYPIC ANALYSIS:  Gated population: Flow cytometric immunophenotyping is not performed using antibodies to the antigens listed in the table below. Electronic gates not are placed around a cell cluster displaying light scatter properties corresponding to: lymphocytes due to insufficient quantity of cells in sample submitted for analysis  Abnormal Cells in gated population: N/A  Phenotype of Abnormal Cells: N/A                       Lymphoid Antigens       Myeloid Antigens Miscellaneous CD2  ND   CD10 ND   CD11b     ND   CD45 ND CD3  ND   CD19 ND   CD11c     ND   HLA-Dr    ND CD4  ND   CD20 ND   CD13 ND   CD34 ND CD5  ND   CD22 ND   CD14  ND   CD38 ND CD7  ND   CD79b     ND   CD15 ND   CD138     ND CD8  ND   CD103     ND   CD16 ND   TdT  ND CD25 ND   CD200      ND   CD33 ND   CD123     ND TCRab     ND  sKappa    ND   CD64 ND   CD41 ND TCRgd     ND   sLambda   ND   CD117     ND   CD61 ND CD56 ND   cKappa    ND   MPO  ND   CD71 ND CD57 ND   cLambda   ND        [id number]   GROSS DESCRIPTION:  Reference tissue case [id number].    Final Diagnosis performed by Thressa Sheller, MD.   Electronically signed 07/08/2021 Technical component performed at [facility], [place] [address], [place], [place] [address].  Professional component performed at [employer]. [facility], [place] [address] [place], [place] [address].   CBC with Diff     Status: Abnormal   Collection Time: 09/19/21 11:53 AM  Result Value Ref Range   WBC 6.4 3.4 - 10.8 x10E3/uL   RBC 2.97 (L) 3.77 - 5.28 x10E6/uL   Hemoglobin 8.9 (L) 11.1 - 15.9 g/dL   Hematocrit 26.9 (L) 34.0 - 46.6 %   MCV 91 79 - 97 fL   MCH 30.0 26.6 - 33.0 pg   MCHC 33.1 31.5 - 35.7 g/dL   RDW 17.8 (H) 11.7 - 15.4 %   Platelets 175 150 - 450 x10E3/uL   Neutrophils 58 Not Estab. %   Lymphs 25 Not Estab. %   Monocytes 13 Not Estab. %   Eos 3 Not Estab. %   Basos 0 Not Estab. %   Neutrophils Absolute 3.7 1.4 - 7.0 x10E3/uL   Lymphocytes Absolute 1.6 0.7 - 3.1 x10E3/uL   Monocytes Absolute 0.8 0.1 - 0.9 x10E3/uL   EOS (ABSOLUTE) 0.2 0.0 - 0.4 x10E3/uL   Basophils Absolute 0.0 0.0 - 0.2 x10E3/uL   Immature Granulocytes 1 Not Estab. %   Immature Grans (Abs) 0.0 0.0 - 0.1 x10E3/uL    Radiology No results found.  Assessment/Plan  ESRD on hemodialysis ([place]) Duplex today shows her left brachiobasilic AV fistula to be widely patent without significant stenosis.  The aneurysmal access areas with skin changes are stable and there is no impending rupture or skin breakdown.  The skin is somewhat thin in those areas should be avoided as best possible.  Rotating her access sticks will lengthen the durability of this access significantly.  We will plan on seeing her back in 6 months with  duplex.  Hypertension An underlying cause of her renal failure and blood pressure control important in reducing the progression of atherosclerotic disease. On appropriate oral medications.    Leotis Pain, MD  09/24/2021 10:29 AM    This note was created with Dragon medical transcription system.  Any errors from dictation are purely unintentional

## 2021-09-24 NOTE — Assessment & Plan Note (Signed)
Duplex today shows her left brachiobasilic AV fistula to be widely patent without significant stenosis.  The aneurysmal access areas with skin changes are stable and there is no impending rupture or skin breakdown.  The skin is somewhat thin in those areas should be avoided as best possible.  Rotating her access sticks will lengthen the durability of this access significantly.  We will plan on seeing her back in 6 months with duplex.

## 2021-09-24 NOTE — Assessment & Plan Note (Signed)
An underlying cause of her renal failure and blood pressure control important in reducing the progression of atherosclerotic disease. On appropriate oral medications.  

## 2021-09-25 DIAGNOSIS — D509 Iron deficiency anemia, unspecified: Secondary | ICD-10-CM | POA: Diagnosis not present

## 2021-09-25 DIAGNOSIS — N186 End stage renal disease: Secondary | ICD-10-CM | POA: Diagnosis not present

## 2021-09-25 DIAGNOSIS — Z992 Dependence on renal dialysis: Secondary | ICD-10-CM | POA: Diagnosis not present

## 2021-09-25 DIAGNOSIS — N2581 Secondary hyperparathyroidism of renal origin: Secondary | ICD-10-CM | POA: Diagnosis not present

## 2021-09-27 DIAGNOSIS — Z992 Dependence on renal dialysis: Secondary | ICD-10-CM | POA: Diagnosis not present

## 2021-09-27 DIAGNOSIS — N2581 Secondary hyperparathyroidism of renal origin: Secondary | ICD-10-CM | POA: Diagnosis not present

## 2021-09-27 DIAGNOSIS — D509 Iron deficiency anemia, unspecified: Secondary | ICD-10-CM | POA: Diagnosis not present

## 2021-09-27 DIAGNOSIS — N186 End stage renal disease: Secondary | ICD-10-CM | POA: Diagnosis not present

## 2021-09-30 DIAGNOSIS — Z992 Dependence on renal dialysis: Secondary | ICD-10-CM | POA: Diagnosis not present

## 2021-09-30 DIAGNOSIS — N186 End stage renal disease: Secondary | ICD-10-CM | POA: Diagnosis not present

## 2021-09-30 DIAGNOSIS — N2581 Secondary hyperparathyroidism of renal origin: Secondary | ICD-10-CM | POA: Diagnosis not present

## 2021-09-30 DIAGNOSIS — D509 Iron deficiency anemia, unspecified: Secondary | ICD-10-CM | POA: Diagnosis not present

## 2021-10-01 DIAGNOSIS — N186 End stage renal disease: Secondary | ICD-10-CM | POA: Diagnosis not present

## 2021-10-01 DIAGNOSIS — I129 Hypertensive chronic kidney disease with stage 1 through stage 4 chronic kidney disease, or unspecified chronic kidney disease: Secondary | ICD-10-CM | POA: Diagnosis not present

## 2021-10-01 DIAGNOSIS — Z992 Dependence on renal dialysis: Secondary | ICD-10-CM | POA: Diagnosis not present

## 2021-10-02 DIAGNOSIS — N2581 Secondary hyperparathyroidism of renal origin: Secondary | ICD-10-CM | POA: Diagnosis not present

## 2021-10-02 DIAGNOSIS — E1129 Type 2 diabetes mellitus with other diabetic kidney complication: Secondary | ICD-10-CM | POA: Diagnosis not present

## 2021-10-02 DIAGNOSIS — L299 Pruritus, unspecified: Secondary | ICD-10-CM | POA: Diagnosis not present

## 2021-10-02 DIAGNOSIS — Z992 Dependence on renal dialysis: Secondary | ICD-10-CM | POA: Diagnosis not present

## 2021-10-02 DIAGNOSIS — N186 End stage renal disease: Secondary | ICD-10-CM | POA: Diagnosis not present

## 2021-10-03 DIAGNOSIS — E041 Nontoxic single thyroid nodule: Secondary | ICD-10-CM | POA: Diagnosis not present

## 2021-10-03 DIAGNOSIS — N2581 Secondary hyperparathyroidism of renal origin: Secondary | ICD-10-CM | POA: Diagnosis not present

## 2021-10-04 DIAGNOSIS — N186 End stage renal disease: Secondary | ICD-10-CM | POA: Diagnosis not present

## 2021-10-04 DIAGNOSIS — E1129 Type 2 diabetes mellitus with other diabetic kidney complication: Secondary | ICD-10-CM | POA: Diagnosis not present

## 2021-10-04 DIAGNOSIS — N2581 Secondary hyperparathyroidism of renal origin: Secondary | ICD-10-CM | POA: Diagnosis not present

## 2021-10-04 DIAGNOSIS — L299 Pruritus, unspecified: Secondary | ICD-10-CM | POA: Diagnosis not present

## 2021-10-04 DIAGNOSIS — Z992 Dependence on renal dialysis: Secondary | ICD-10-CM | POA: Diagnosis not present

## 2021-10-07 DIAGNOSIS — N186 End stage renal disease: Secondary | ICD-10-CM | POA: Diagnosis not present

## 2021-10-07 DIAGNOSIS — L299 Pruritus, unspecified: Secondary | ICD-10-CM | POA: Diagnosis not present

## 2021-10-07 DIAGNOSIS — Z992 Dependence on renal dialysis: Secondary | ICD-10-CM | POA: Diagnosis not present

## 2021-10-07 DIAGNOSIS — E1129 Type 2 diabetes mellitus with other diabetic kidney complication: Secondary | ICD-10-CM | POA: Diagnosis not present

## 2021-10-07 DIAGNOSIS — N2581 Secondary hyperparathyroidism of renal origin: Secondary | ICD-10-CM | POA: Diagnosis not present

## 2021-10-09 DIAGNOSIS — N2581 Secondary hyperparathyroidism of renal origin: Secondary | ICD-10-CM | POA: Diagnosis not present

## 2021-10-09 DIAGNOSIS — L299 Pruritus, unspecified: Secondary | ICD-10-CM | POA: Diagnosis not present

## 2021-10-09 DIAGNOSIS — E1129 Type 2 diabetes mellitus with other diabetic kidney complication: Secondary | ICD-10-CM | POA: Diagnosis not present

## 2021-10-09 DIAGNOSIS — Z992 Dependence on renal dialysis: Secondary | ICD-10-CM | POA: Diagnosis not present

## 2021-10-09 DIAGNOSIS — N186 End stage renal disease: Secondary | ICD-10-CM | POA: Diagnosis not present

## 2021-10-10 ENCOUNTER — Other Ambulatory Visit: Payer: Self-pay | Admitting: Student

## 2021-10-10 DIAGNOSIS — I10 Essential (primary) hypertension: Secondary | ICD-10-CM

## 2021-10-11 DIAGNOSIS — E1129 Type 2 diabetes mellitus with other diabetic kidney complication: Secondary | ICD-10-CM | POA: Diagnosis not present

## 2021-10-11 DIAGNOSIS — N186 End stage renal disease: Secondary | ICD-10-CM | POA: Diagnosis not present

## 2021-10-11 DIAGNOSIS — N2581 Secondary hyperparathyroidism of renal origin: Secondary | ICD-10-CM | POA: Diagnosis not present

## 2021-10-11 DIAGNOSIS — Z992 Dependence on renal dialysis: Secondary | ICD-10-CM | POA: Diagnosis not present

## 2021-10-11 DIAGNOSIS — L299 Pruritus, unspecified: Secondary | ICD-10-CM | POA: Diagnosis not present

## 2021-10-11 NOTE — Telephone Encounter (Signed)
Patient confirms that she is taking Clonidine 0.1 mg BID ?

## 2021-10-14 DIAGNOSIS — R059 Cough, unspecified: Secondary | ICD-10-CM | POA: Diagnosis not present

## 2021-10-14 DIAGNOSIS — N186 End stage renal disease: Secondary | ICD-10-CM | POA: Diagnosis not present

## 2021-10-14 DIAGNOSIS — N2581 Secondary hyperparathyroidism of renal origin: Secondary | ICD-10-CM | POA: Diagnosis not present

## 2021-10-14 DIAGNOSIS — E1129 Type 2 diabetes mellitus with other diabetic kidney complication: Secondary | ICD-10-CM | POA: Diagnosis not present

## 2021-10-14 DIAGNOSIS — L299 Pruritus, unspecified: Secondary | ICD-10-CM | POA: Diagnosis not present

## 2021-10-14 DIAGNOSIS — Z992 Dependence on renal dialysis: Secondary | ICD-10-CM | POA: Diagnosis not present

## 2021-10-16 DIAGNOSIS — L299 Pruritus, unspecified: Secondary | ICD-10-CM | POA: Diagnosis not present

## 2021-10-16 DIAGNOSIS — E1129 Type 2 diabetes mellitus with other diabetic kidney complication: Secondary | ICD-10-CM | POA: Diagnosis not present

## 2021-10-16 DIAGNOSIS — N186 End stage renal disease: Secondary | ICD-10-CM | POA: Diagnosis not present

## 2021-10-16 DIAGNOSIS — Z992 Dependence on renal dialysis: Secondary | ICD-10-CM | POA: Diagnosis not present

## 2021-10-16 DIAGNOSIS — N2581 Secondary hyperparathyroidism of renal origin: Secondary | ICD-10-CM | POA: Diagnosis not present

## 2021-10-18 DIAGNOSIS — Z992 Dependence on renal dialysis: Secondary | ICD-10-CM | POA: Diagnosis not present

## 2021-10-18 DIAGNOSIS — N2581 Secondary hyperparathyroidism of renal origin: Secondary | ICD-10-CM | POA: Diagnosis not present

## 2021-10-18 DIAGNOSIS — E1129 Type 2 diabetes mellitus with other diabetic kidney complication: Secondary | ICD-10-CM | POA: Diagnosis not present

## 2021-10-18 DIAGNOSIS — N186 End stage renal disease: Secondary | ICD-10-CM | POA: Diagnosis not present

## 2021-10-18 DIAGNOSIS — L299 Pruritus, unspecified: Secondary | ICD-10-CM | POA: Diagnosis not present

## 2021-10-21 DIAGNOSIS — N2581 Secondary hyperparathyroidism of renal origin: Secondary | ICD-10-CM | POA: Diagnosis not present

## 2021-10-21 DIAGNOSIS — N186 End stage renal disease: Secondary | ICD-10-CM | POA: Diagnosis not present

## 2021-10-21 DIAGNOSIS — Z992 Dependence on renal dialysis: Secondary | ICD-10-CM | POA: Diagnosis not present

## 2021-10-21 DIAGNOSIS — E1129 Type 2 diabetes mellitus with other diabetic kidney complication: Secondary | ICD-10-CM | POA: Diagnosis not present

## 2021-10-21 DIAGNOSIS — L299 Pruritus, unspecified: Secondary | ICD-10-CM | POA: Diagnosis not present

## 2021-10-22 ENCOUNTER — Encounter (HOSPITAL_COMMUNITY): Payer: Self-pay | Admitting: *Deleted

## 2021-10-22 ENCOUNTER — Inpatient Hospital Stay (HOSPITAL_COMMUNITY)
Admission: EM | Admit: 2021-10-22 | Discharge: 2021-10-24 | DRG: 377 | Disposition: A | Discharge status: Home / Self Care | Payer: Medicare Other | Attending: Internal Medicine | Admitting: Internal Medicine

## 2021-10-22 ENCOUNTER — Emergency Department (HOSPITAL_COMMUNITY): Payer: Medicare Other

## 2021-10-22 ENCOUNTER — Other Ambulatory Visit: Payer: Self-pay

## 2021-10-22 DIAGNOSIS — Z8719 Personal history of other diseases of the digestive system: Secondary | ICD-10-CM

## 2021-10-22 DIAGNOSIS — Z20822 Contact with and (suspected) exposure to covid-19: Secondary | ICD-10-CM | POA: Diagnosis present

## 2021-10-22 DIAGNOSIS — Z880 Allergy status to penicillin: Secondary | ICD-10-CM | POA: Diagnosis not present

## 2021-10-22 DIAGNOSIS — L309 Dermatitis, unspecified: Secondary | ICD-10-CM | POA: Diagnosis present

## 2021-10-22 DIAGNOSIS — E8779 Other fluid overload: Secondary | ICD-10-CM | POA: Diagnosis not present

## 2021-10-22 DIAGNOSIS — K254 Chronic or unspecified gastric ulcer with hemorrhage: Principal | ICD-10-CM | POA: Diagnosis present

## 2021-10-22 DIAGNOSIS — K319 Disease of stomach and duodenum, unspecified: Secondary | ICD-10-CM | POA: Diagnosis not present

## 2021-10-22 DIAGNOSIS — K921 Melena: Secondary | ICD-10-CM | POA: Diagnosis present

## 2021-10-22 DIAGNOSIS — Z7982 Long term (current) use of aspirin: Secondary | ICD-10-CM

## 2021-10-22 DIAGNOSIS — R000 Tachycardia, unspecified: Secondary | ICD-10-CM | POA: Diagnosis not present

## 2021-10-22 DIAGNOSIS — I120 Hypertensive chronic kidney disease with stage 5 chronic kidney disease or end stage renal disease: Secondary | ICD-10-CM | POA: Diagnosis not present

## 2021-10-22 DIAGNOSIS — K219 Gastro-esophageal reflux disease without esophagitis: Secondary | ICD-10-CM | POA: Diagnosis present

## 2021-10-22 DIAGNOSIS — Z992 Dependence on renal dialysis: Secondary | ICD-10-CM

## 2021-10-22 DIAGNOSIS — M1990 Unspecified osteoarthritis, unspecified site: Secondary | ICD-10-CM | POA: Diagnosis present

## 2021-10-22 DIAGNOSIS — Z833 Family history of diabetes mellitus: Secondary | ICD-10-CM | POA: Diagnosis not present

## 2021-10-22 DIAGNOSIS — F329 Major depressive disorder, single episode, unspecified: Secondary | ICD-10-CM | POA: Diagnosis present

## 2021-10-22 DIAGNOSIS — I700 Atherosclerosis of aorta: Secondary | ICD-10-CM | POA: Diagnosis not present

## 2021-10-22 DIAGNOSIS — Y830 Surgical operation with transplant of whole organ as the cause of abnormal reaction of the patient, or of later complication, without mention of misadventure at the time of the procedure: Secondary | ICD-10-CM | POA: Diagnosis present

## 2021-10-22 DIAGNOSIS — F17210 Nicotine dependence, cigarettes, uncomplicated: Secondary | ICD-10-CM | POA: Diagnosis not present

## 2021-10-22 DIAGNOSIS — R040 Epistaxis: Secondary | ICD-10-CM | POA: Diagnosis present

## 2021-10-22 DIAGNOSIS — K2970 Gastritis, unspecified, without bleeding: Secondary | ICD-10-CM | POA: Diagnosis not present

## 2021-10-22 DIAGNOSIS — Z79899 Other long term (current) drug therapy: Secondary | ICD-10-CM

## 2021-10-22 DIAGNOSIS — Z841 Family history of disorders of kidney and ureter: Secondary | ICD-10-CM

## 2021-10-22 DIAGNOSIS — Z83438 Family history of other disorder of lipoprotein metabolism and other lipidemia: Secondary | ICD-10-CM | POA: Diagnosis not present

## 2021-10-22 DIAGNOSIS — K625 Hemorrhage of anus and rectum: Secondary | ICD-10-CM | POA: Diagnosis not present

## 2021-10-22 DIAGNOSIS — K250 Acute gastric ulcer with hemorrhage: Secondary | ICD-10-CM | POA: Diagnosis not present

## 2021-10-22 DIAGNOSIS — F32A Depression, unspecified: Secondary | ICD-10-CM | POA: Diagnosis present

## 2021-10-22 DIAGNOSIS — E213 Hyperparathyroidism, unspecified: Secondary | ICD-10-CM | POA: Diagnosis present

## 2021-10-22 DIAGNOSIS — K2961 Other gastritis with bleeding: Secondary | ICD-10-CM | POA: Diagnosis present

## 2021-10-22 DIAGNOSIS — Z8249 Family history of ischemic heart disease and other diseases of the circulatory system: Secondary | ICD-10-CM

## 2021-10-22 DIAGNOSIS — T8612 Kidney transplant failure: Secondary | ICD-10-CM | POA: Diagnosis not present

## 2021-10-22 DIAGNOSIS — D631 Anemia in chronic kidney disease: Secondary | ICD-10-CM | POA: Diagnosis present

## 2021-10-22 DIAGNOSIS — D320 Benign neoplasm of cerebral meninges: Secondary | ICD-10-CM | POA: Diagnosis not present

## 2021-10-22 DIAGNOSIS — K922 Gastrointestinal hemorrhage, unspecified: Secondary | ICD-10-CM | POA: Diagnosis not present

## 2021-10-22 DIAGNOSIS — D649 Anemia, unspecified: Secondary | ICD-10-CM

## 2021-10-22 DIAGNOSIS — R079 Chest pain, unspecified: Secondary | ICD-10-CM | POA: Diagnosis not present

## 2021-10-22 DIAGNOSIS — Z560 Unemployment, unspecified: Secondary | ICD-10-CM

## 2021-10-22 DIAGNOSIS — R0789 Other chest pain: Secondary | ICD-10-CM | POA: Diagnosis not present

## 2021-10-22 DIAGNOSIS — K259 Gastric ulcer, unspecified as acute or chronic, without hemorrhage or perforation: Secondary | ICD-10-CM | POA: Diagnosis not present

## 2021-10-22 DIAGNOSIS — K2971 Gastritis, unspecified, with bleeding: Secondary | ICD-10-CM | POA: Diagnosis not present

## 2021-10-22 DIAGNOSIS — K3189 Other diseases of stomach and duodenum: Secondary | ICD-10-CM | POA: Diagnosis not present

## 2021-10-22 DIAGNOSIS — N186 End stage renal disease: Secondary | ICD-10-CM | POA: Diagnosis not present

## 2021-10-22 LAB — BASIC METABOLIC PANEL
Anion gap: 22 — ABNORMAL HIGH (ref 5–15)
BUN: 147 mg/dL — ABNORMAL HIGH (ref 6–20)
CO2: 21 mmol/L — ABNORMAL LOW (ref 22–32)
Calcium: 8.8 mg/dL — ABNORMAL LOW (ref 8.9–10.3)
Chloride: 94 mmol/L — ABNORMAL LOW (ref 98–111)
Creatinine, Ser: 10.35 mg/dL — ABNORMAL HIGH (ref 0.44–1.00)
GFR, Estimated: 4 mL/min — ABNORMAL LOW (ref >60)
Glucose, Bld: 119 mg/dL — ABNORMAL HIGH (ref 70–99)
Potassium: 4.6 mmol/L (ref 3.5–5.1)
Sodium: 137 mmol/L (ref 135–145)

## 2021-10-22 LAB — RESP PANEL BY RT-PCR (FLU A&B, COVID) ARPGX2
Influenza A by PCR: NEGATIVE
Influenza B by PCR: NEGATIVE
SARS Coronavirus 2 by RT PCR: NEGATIVE

## 2021-10-22 LAB — POC OCCULT BLOOD, ED: Fecal Occult Bld: POSITIVE — AB

## 2021-10-22 LAB — TROPONIN I (HIGH SENSITIVITY)
Troponin I (High Sensitivity): 16 ng/L (ref <18)
Troponin I (High Sensitivity): 18 ng/L — ABNORMAL HIGH (ref <18)

## 2021-10-22 LAB — PREPARE RBC (CROSSMATCH)

## 2021-10-22 LAB — CBC WITH DIFFERENTIAL/PLATELET
Abs Immature Granulocytes: 0.08 10*3/uL — ABNORMAL HIGH (ref 0.00–0.07)
Basophils Absolute: 0 10*3/uL (ref 0.0–0.1)
Basophils Relative: 0 %
Eosinophils Absolute: 0.2 10*3/uL (ref 0.0–0.5)
Eosinophils Relative: 2 %
HCT: 14 % — ABNORMAL LOW (ref 36.0–46.0)
Hemoglobin: 4.1 g/dL — CL (ref 12.0–15.0)
Immature Granulocytes: 1 %
Lymphocytes Relative: 23 %
Lymphs Abs: 2.9 10*3/uL (ref 0.7–4.0)
MCH: 30.1 pg (ref 26.0–34.0)
MCHC: 29.3 g/dL — ABNORMAL LOW (ref 30.0–36.0)
MCV: 102.9 fL — ABNORMAL HIGH (ref 80.0–100.0)
Monocytes Absolute: 0.5 10*3/uL (ref 0.1–1.0)
Monocytes Relative: 4 %
Neutro Abs: 8.6 10*3/uL — ABNORMAL HIGH (ref 1.7–7.7)
Neutrophils Relative %: 70 %
Platelets: 156 10*3/uL (ref 150–400)
RBC: 1.36 MIL/uL — ABNORMAL LOW (ref 3.87–5.11)
RDW: 20.7 % — ABNORMAL HIGH (ref 11.5–15.5)
WBC: 12.2 10*3/uL — ABNORMAL HIGH (ref 4.0–10.5)
nRBC: 0.2 % (ref 0.0–0.2)

## 2021-10-22 LAB — CBC
HCT: 16 % — ABNORMAL LOW (ref 36.0–46.0)
Hemoglobin: 5.4 g/dL — CL (ref 12.0–15.0)
MCH: 30.9 pg (ref 26.0–34.0)
MCHC: 33.8 g/dL (ref 30.0–36.0)
MCV: 91.4 fL (ref 80.0–100.0)
Platelets: 129 10*3/uL — ABNORMAL LOW (ref 150–400)
RBC: 1.75 MIL/uL — ABNORMAL LOW (ref 3.87–5.11)
RDW: 17.8 % — ABNORMAL HIGH (ref 11.5–15.5)
WBC: 10.8 10*3/uL — ABNORMAL HIGH (ref 4.0–10.5)
nRBC: 0.5 % — ABNORMAL HIGH (ref 0.0–0.2)

## 2021-10-22 LAB — HEPATITIS B SURFACE ANTIBODY,QUALITATIVE: Hep B S Ab: REACTIVE — AB

## 2021-10-22 LAB — HEPATITIS B SURFACE ANTIGEN: Hepatitis B Surface Ag: NONREACTIVE

## 2021-10-22 IMAGING — CR DG CHEST 2V
2 series · 2 of 2 positions shown · non-contrast
Comparison: Chest x-ray [DATE].

CLINICAL DATA: 56-year-old female with history of chest pain.

EXAM:
CHEST - 2 VIEW

[chest lat]
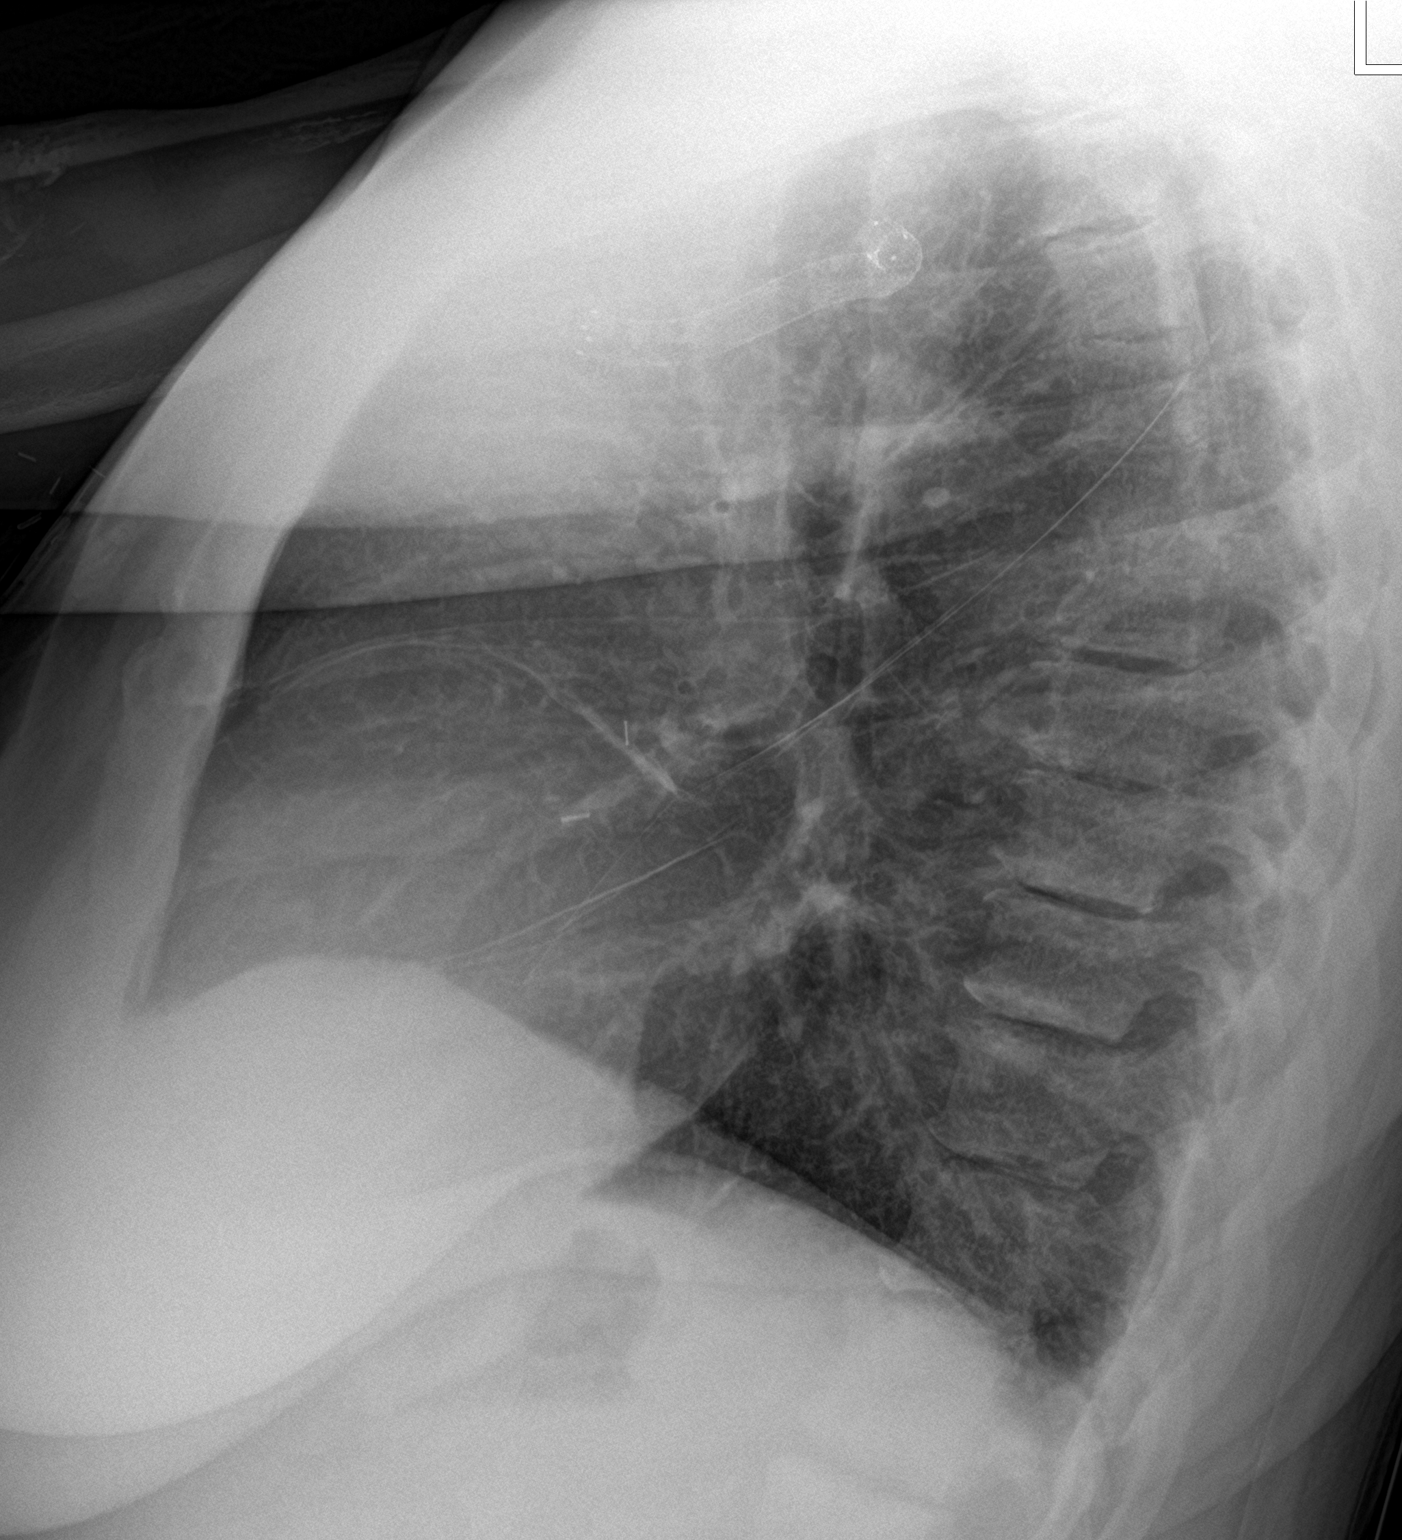

[chest ap]
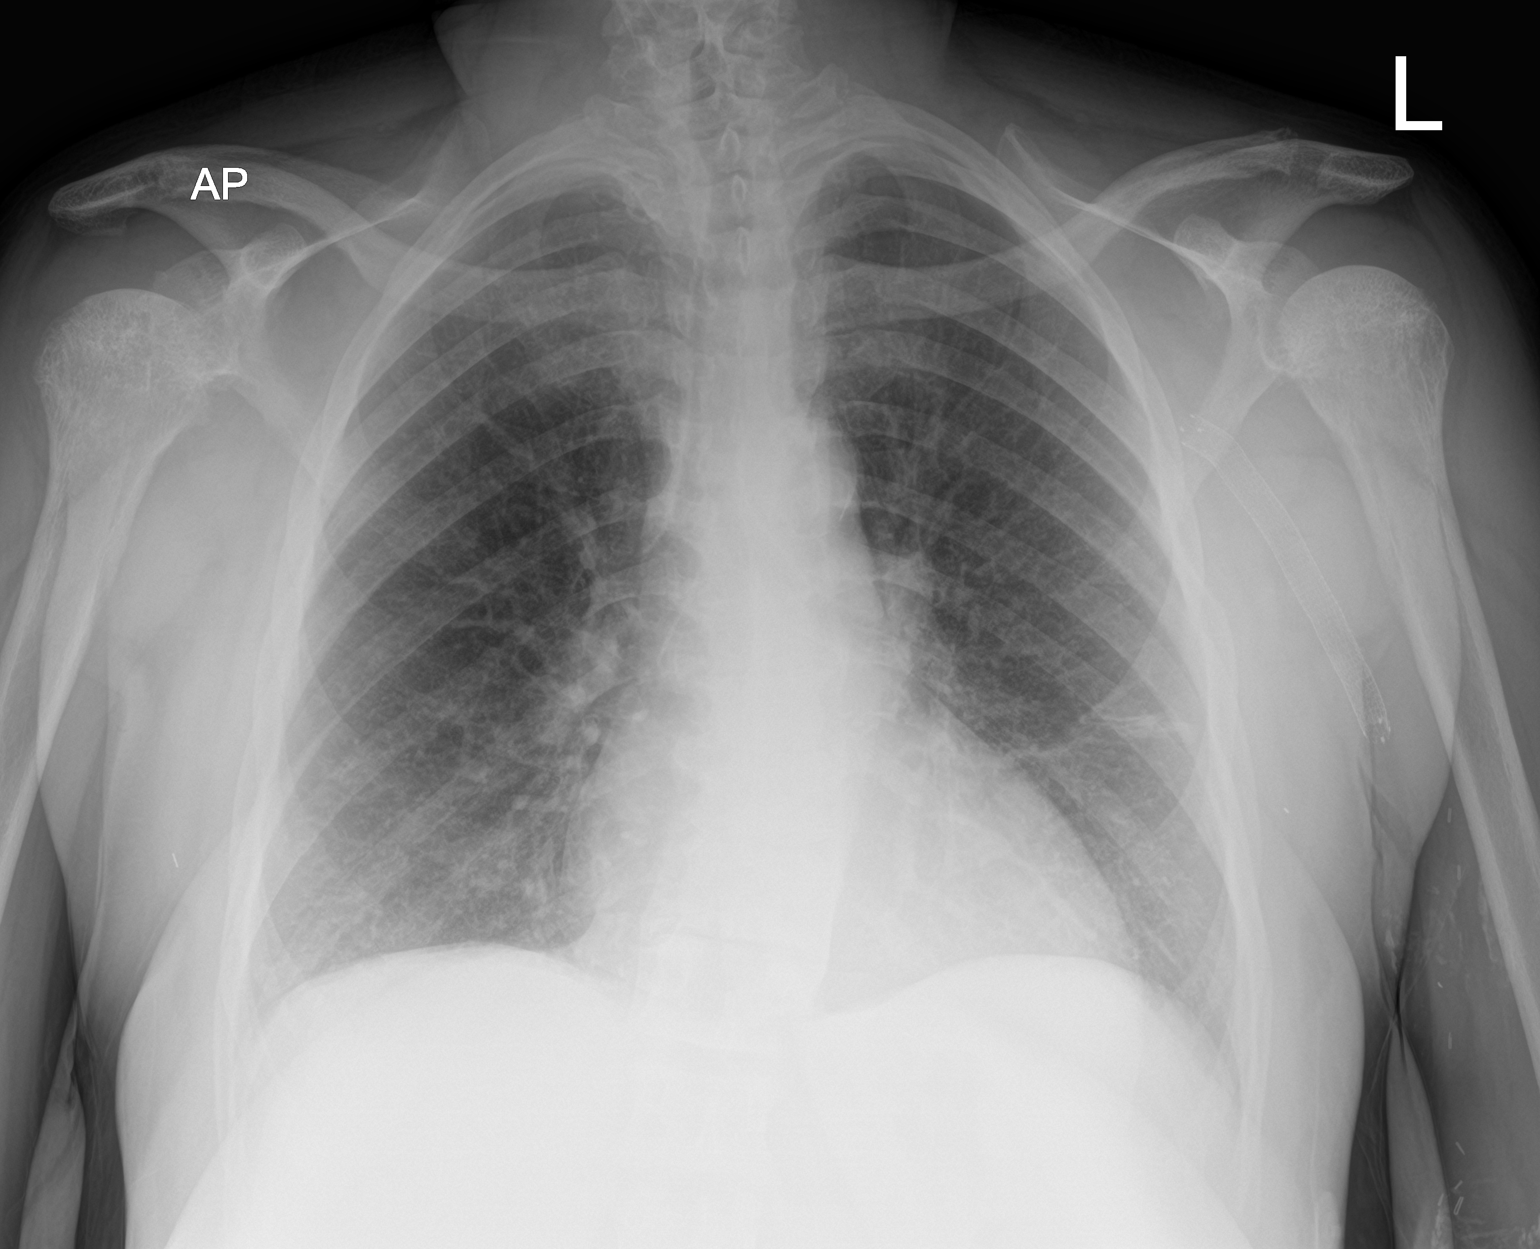

[2 of 2 positions shown; findings below may reference images not displayed]

FINDINGS: Linear scarring or atelectasis in the left mid lung. Lung volumes
are normal. No consolidative airspace disease. No pleural effusions.
No pneumothorax. No pulmonary nodule or mass noted. Pulmonary
vasculature and the cardiomediastinal silhouette are within normal
limits. Atherosclerosis in the thoracic aorta.
IMPRESSION: 1. No radiographic evidence of acute cardiopulmonary disease.
2. Linear scarring or subsegmental atelectasis in the left mid lung.
3. Aortic atherosclerosis.

## 2021-10-22 MED ORDER — SODIUM CHLORIDE 0.9 % IV SOLN
10.0000 mL/h | Freq: Once | INTRAVENOUS | Status: AC
Start: 2021-10-22 — End: 2021-10-22
  Administered 2021-10-22: 10 mL/h via INTRAVENOUS

## 2021-10-22 MED ORDER — CHLORHEXIDINE GLUCONATE CLOTH 2 % EX PADS
6.0000 | MEDICATED_PAD | Freq: Every day | CUTANEOUS | Status: DC
  Administered 2021-10-23 – 2021-10-24 (×2): 6 via TOPICAL

## 2021-10-22 MED ORDER — SODIUM CHLORIDE 0.9% IV SOLUTION: Freq: Once | INTRAVENOUS | Status: AC

## 2021-10-22 MED ORDER — PANTOPRAZOLE SODIUM 40 MG IV SOLR
40.0000 mg | Freq: Two times a day (BID) | INTRAVENOUS | Status: DC
  Administered 2021-10-22 (×2): 40 mg via INTRAVENOUS
  Filled 2021-10-22 (×2): qty 10

## 2021-10-22 MED ORDER — FENTANYL CITRATE PF 50 MCG/ML IJ SOSY
50.0000 ug | PREFILLED_SYRINGE | Freq: Once | INTRAMUSCULAR | Status: AC
  Administered 2021-10-22: 50 ug via INTRAVENOUS
  Filled 2021-10-22: qty 1

## 2021-10-22 MED ORDER — HYDROMORPHONE HCL 1 MG/ML IJ SOLN
0.5000 mg | Freq: Four times a day (QID) | INTRAMUSCULAR | Status: DC | PRN
  Administered 2021-10-22 – 2021-10-23 (×3): 0.5 mg via INTRAVENOUS
  Filled 2021-10-22 (×3): qty 0.5

## 2021-10-22 NOTE — H&P (View-Only) (Signed)
? ?                                                                          [facility] Consult: ?2:05 PM ?10/22/2021 ? LOS: 0 days  ? ? ?Referring Provider: Dr Cain Sieve  ?Primary Care Physician:  Gaylan Gerold, DO ?Primary Gastroenterologist:  Dr. Carlean Purl in 02/2019 for colonoscopy.   ? ? ? ?Reason for Consultation:  anemia.   ?  ?HPI: Ruth Gutierrez is a 56 y.o. female.  Hx ESRD, HD MWF.  Failed renal transplant 2010.  S/p multiple dialysis access procedures.  Infected PD catheter.  Anemia of CKD.  Resection brain meningioma 10/2020 ?02/2019 Colonoscopy. Average risk screening .  9 small polyps removed from rectum, sigmoid, transverse ( SSP, adenoma and hyperplastic pathology).   Diffuse area of melanotic mucosa throughout the entire colon, not bx'd. ? ? ?Developed left chest pain yesterday after dialysis.  Sharp quality worse with moving left arm.  Postprandial nonbloody, non-coffee-ground emesis for about a week.  Black stools for about 3 days.  Depressed appetite.  Within the past few days had an episode of epistaxis and for the next couple of days had dark material in her snot.  Weakness.  Dark stools for about 3 days.  Compliant with 81 mg aspirin but also takes 2 or 3 Bayer back formula tablets (500 mg ASA and caffeine combo in each pill) daily.  No PPI or H2 blocker on med list.  Currently on course of Venofer infusions at dialysis, nephrology note today says she has completed 3 of 10 infusions but timing of these is not outlined.  Drinks 3 to 4 ounces of tequila every couple of weeks on weekends.  No syncope, no seizures. ? ?Hgb 4.1, was 8.9 a month ago.  PRBC x 2.  FOBT +.   ?T. bili 1.2.  Alk phos 256.  AST/ALT normal. ?CXR today with no significant findings, no infiltrates or acute cardiopulmonary disease. ?  ? ?Past Medical History:  ?Diagnosis Date  ? Anemia of chronic disease   ? Arthritis   ?  Deceased-donor kidney transplant   ? Performed at [facility], April 2010.  Initial ESRD due to HTN nephropathy  ? Eczema   ? ESRD (end stage renal disease) ([place])   ? M/W/F dialysis  ? FUO (fever of unknown origin) 05/17/2015  ? GERD (gastroesophageal reflux disease)   ? Headache(784.0)   ? History of hyperparathyroidism   ? Hypertension   ? Peritonitis ([place]) 10/2019  ? Shortness of breath   ? Wears glasses   ? ? ?Past Surgical History:  ?Procedure Laterality Date  ? A/V FISTULAGRAM Left 02/12/2017  ? Procedure: A/V Fistulagram;  Surgeon: Algernon Huxley, MD;  Location: [place] [facility];  Service: Cardiovascular;  Laterality: Left;  ? A/V FISTULAGRAM Left 12/30/2017  ? Procedure: A/V FISTULAGRAM;  Surgeon: Algernon Huxley, MD;  Location: [place] [facility];  Service: Cardiovascular;  Laterality: Left;  ? A/V FISTULAGRAM Left 04/01/2021  ? Procedure: A/V FISTULAGRAM;  Surgeon: Algernon Huxley, MD;  Location: [place] [facility];  Service: Cardiovascular;  Laterality: Left;  ? A/V SHUNT INTERVENTION N/A 02/12/2017  ? Procedure: A/V Shunt Intervention;  Surgeon:  Algernon Huxley, MD;  Location: [place] [facility];  Service: Cardiovascular;  Laterality: N/A;  ? AV FISTULA PLACEMENT    ? BASCILIC VEIN TRANSPOSITION Left 10/30/2014  ? Procedure: LEFT BASCILIC VEIN TRANSPOSITION;  Surgeon: Rosetta Posner, MD;  Location: [place];  Service: Vascular;  Laterality: Left;  ? BASCILIC VEIN TRANSPOSITION Left 01/03/2015  ? Procedure: LEFT ARM 2ND STAGE [place];  Surgeon: Rosetta Posner, MD;  Location: [place];  Service: Vascular;  Laterality: Left;  ? BREAST BIOPSY Left   ? Patient doesn't remember any information from previous procedure.  ? CAPD REMOVAL N/A 10/31/2019  ? Procedure: PERITONEAL DIALYSIS  (CAPD) INFECTED CATHETER REMOVAL;  Surgeon: Coralie Keens, MD;  Location: [place];  Service: General;  Laterality: N/A;  ? CRANIOTOMY N/A 11/01/2020  ? Procedure: CRANIOTOMY FOR TUMOR EXCISION;  Surgeon: [name], MD;  Location: [place];  Service: Neurosurgery;  Laterality: N/A;  ? FRACTURE SURGERY    ? left foot,baby toe nad next toe missing  ? INSERTION OF DIALYSIS CATHETER Right 10/30/2014  ? Procedure: INSERTION OF DIALYSIS CATHETER;  Surgeon: Rosetta Posner, MD;  Location: [place];  Service: Vascular;  Laterality: Right;  ? KIDNEY TRANSPLANT  11/02/2008  ? [facility])  ? PLACEMENT OF LUMBAR DRAIN N/A 11/01/2020  ? Procedure: PLACEMENT OF LUMBAR DRAIN;  Surgeon: [name], MD;  Location: [place];  Service: Neurosurgery;  Laterality: N/A;  ? WISDOM TOOTH EXTRACTION    ? ? ?Prior to Admission medications   ?Medication Sig Start Date End Date Taking? Authorizing Provider  ?albuterol (VENTOLIN HFA) 108 (90 Base) MCG/ACT inhaler Inhale 3 puffs into the lungs daily as needed for wheezing or shortness of breath.   Yes [provider]  ?amLODipine (NORVASC) 10 MG tablet Take 1 tablet (10 mg total) by mouth daily. 07/02/21  Yes Lajean Manes, MD  ?aspirin 81 MG EC tablet Take 81 mg by mouth as needed for pain. 05/15/15  Yes [provider]  ?Aspirin-Caffeine (BAYER BACK & BODY PO) Take 1 tablet by mouth daily as needed (pain).   Yes [provider]  ?carvedilol (COREG) 12.5 MG tablet Take 1 tablet (12.5 mg total) by mouth 2 (two) times daily with a meal. 12/20/20 12/15/21 Yes Gaylan Gerold, DO  ?Cholecalciferol (VITAMIN D3 PO) Take 1 tablet by mouth daily.   Yes [provider]  ?cloNIDine (CATAPRES) 0.1 MG tablet Take 1 tablet (0.1 mg total) by mouth 2 (two) times daily. 10/11/21 10/11/22 Yes Gaylan Gerold, DO  ?diphenhydrAMINE (BENADRYL) 25 mg capsule Take 25 mg by mouth at bedtime as needed for sleep.   Yes [provider]  ?ibuprofen (ADVIL) 200 MG tablet Take 200 mg by mouth every 6 (six) hours as needed for mild pain.   Yes [provider]  ?lidocaine-prilocaine (EMLA) cream Apply 1 application. topically every Monday, Wednesday, and Friday with hemodialysis. 02/01/20   Yes [provider]  ?Polyethylene Glycol 400 (BLINK TEARS OP) Place 1 drop into both eyes daily as needed (tired eyes).   Yes [provider]  ?sertraline (ZOLOFT) 50 MG tablet Take 1 tablet (50 mg total) by mouth daily. 07/02/21 07/02/22 Yes Lajean Manes, MD  ?sevelamer carbonate (RENVELA) 800 MG tablet Take 2,400 mg by mouth 3 (three) times daily with meals. 10/07/21  Yes [provider]  ?levETIRAcetam (KEPPRA) 250 MG tablet Take 1 tablet (250 mg total) by mouth 2 (two) times daily for 9 days. ?Patient not taking:  Reported on 10/22/2021 11/10/20 11/19/20  Gaylan Gerold, DO  ?polyethylene glycol (MIRALAX / GLYCOLAX) 17 g packet Take 17 g by mouth daily. ?Patient not taking: Reported on 10/22/2021 10/11/19   Molli Hazard A, DO  ? ? ?Scheduled Meds: ? [START ON 10/23/2021] Chlorhexidine Gluconate Cloth  6 each Topical Q0600  ? pantoprazole (PROTONIX) IV  40 mg Intravenous Q12H  ? ?Infusions: ? ?PRN Meds: ? ? ? ?Allergies as of 10/22/2021 - Review Complete 10/22/2021  ?Allergen Reaction Noted  ? Penicillins Itching and Rash 02/02/2013  ? ? ?Family History  ?Problem Relation Age of Onset  ? Hypertension Mother   ? Hypertension Father   ? Hypertension Sister   ? Hyperlipidemia Sister   ? Hypertension Sister   ? Diabetes Brother   ? Deep vein thrombosis Brother   ? Kidney disease Brother   ?     on HD  ? Colon cancer Neg Hx   ? Esophageal cancer Neg Hx   ? Rectal cancer Neg Hx   ? Breast cancer Neg Hx   ? ? ?Social History  ? ?Socioeconomic History  ? Marital status: Single  ?  Spouse name: Not on file  ? Number of children: 1  ? Years of education: Not on file  ? Highest education level: Not on file  ?Occupational History  ? Occupation: unemployed  ?Tobacco Use  ? Smoking status: Every Day  ?  Packs/day: 0.50  ?  Years: 27.00  ?  Pack years: 13.50  ?  Types: Cigarettes  ? Smokeless tobacco: Never  ? Tobacco comments:  ?  10 cigarettes a day  ?Vaping Use  ? Vaping Use: Never used  ?Substance and  Sexual Activity  ? Alcohol use: No  ?  Alcohol/week: 0.0 standard drinks  ?  Comment: occasional drinker noted in the past  ? Drug use: No  ? Sexual activity: Yes  ?  Partners: Male  ?  Birth control/pro

## 2021-10-22 NOTE — ED Triage Notes (Signed)
Hgb 4.1 ?

## 2021-10-22 NOTE — Consult Note (Addendum)
? ?                                                                          [facility] Consult: ?2:05 PM ?10/22/2021 ? LOS: 0 days  ? ? ?Referring Provider: Dr Cain Sieve  ?Primary Care Physician:  Gaylan Gerold, DO ?Primary Gastroenterologist:  Dr. Carlean Purl in 02/2019 for colonoscopy.   ? ? ? ?Reason for Consultation:  anemia.   ?  ?HPI: Ruth Gutierrez is a 56 y.o. female.  Hx ESRD, HD MWF.  Failed renal transplant 2010.  S/p multiple dialysis access procedures.  Infected PD catheter.  Anemia of CKD.  Resection brain meningioma 10/2020 ?02/2019 Colonoscopy. Average risk screening .  9 small polyps removed from rectum, sigmoid, transverse ( SSP, adenoma and hyperplastic pathology).   Diffuse area of melanotic mucosa throughout the entire colon, not bx'd. ? ? ?Developed left chest pain yesterday after dialysis.  Sharp quality worse with moving left arm.  Postprandial nonbloody, non-coffee-ground emesis for about a week.  Black stools for about 3 days.  Depressed appetite.  Within the past few days had an episode of epistaxis and for the next couple of days had dark material in her snot.  Weakness.  Dark stools for about 3 days.  Compliant with 81 mg aspirin but also takes 2 or 3 Bayer back formula tablets (500 mg ASA and caffeine combo in each pill) daily.  No PPI or H2 blocker on med list.  Currently on course of Venofer infusions at dialysis, nephrology note today says she has completed 3 of 10 infusions but timing of these is not outlined.  Drinks 3 to 4 ounces of tequila every couple of weeks on weekends.  No syncope, no seizures. ? ?Hgb 4.1, was 8.9 a month ago.  PRBC x 2.  FOBT +.   ?T. bili 1.2.  Alk phos 256.  AST/ALT normal. ?CXR today with no significant findings, no infiltrates or acute cardiopulmonary disease. ?  ? ?Past Medical History:  ?Diagnosis Date  ? Anemia of chronic disease   ? Arthritis   ?  Deceased-donor kidney transplant   ? Performed at [facility], April 2010.  Initial ESRD due to HTN nephropathy  ? Eczema   ? ESRD (end stage renal disease) ([place])   ? M/W/F dialysis  ? FUO (fever of unknown origin) 05/17/2015  ? GERD (gastroesophageal reflux disease)   ? Headache(784.0)   ? History of hyperparathyroidism   ? Hypertension   ? Peritonitis ([place]) 10/2019  ? Shortness of breath   ? Wears glasses   ? ? ?Past Surgical History:  ?Procedure Laterality Date  ? A/V FISTULAGRAM Left 02/12/2017  ? Procedure: A/V Fistulagram;  Surgeon: Algernon Huxley, MD;  Location: [place] [facility];  Service: Cardiovascular;  Laterality: Left;  ? A/V FISTULAGRAM Left 12/30/2017  ? Procedure: A/V FISTULAGRAM;  Surgeon: Algernon Huxley, MD;  Location: [place] [facility];  Service: Cardiovascular;  Laterality: Left;  ? A/V FISTULAGRAM Left 04/01/2021  ? Procedure: A/V FISTULAGRAM;  Surgeon: Algernon Huxley, MD;  Location: [place] [facility];  Service: Cardiovascular;  Laterality: Left;  ? A/V SHUNT INTERVENTION N/A 02/12/2017  ? Procedure: A/V Shunt Intervention;  Surgeon:  Algernon Huxley, MD;  Location: [place] [facility];  Service: Cardiovascular;  Laterality: N/A;  ? AV FISTULA PLACEMENT    ? BASCILIC VEIN TRANSPOSITION Left 10/30/2014  ? Procedure: LEFT BASCILIC VEIN TRANSPOSITION;  Surgeon: Rosetta Posner, MD;  Location: [place];  Service: Vascular;  Laterality: Left;  ? BASCILIC VEIN TRANSPOSITION Left 01/03/2015  ? Procedure: LEFT ARM 2ND STAGE [place];  Surgeon: Rosetta Posner, MD;  Location: [place];  Service: Vascular;  Laterality: Left;  ? BREAST BIOPSY Left   ? Patient doesn't remember any information from previous procedure.  ? CAPD REMOVAL N/A 10/31/2019  ? Procedure: PERITONEAL DIALYSIS  (CAPD) INFECTED CATHETER REMOVAL;  Surgeon: Coralie Keens, MD;  Location: [place];  Service: General;  Laterality: N/A;  ? CRANIOTOMY N/A 11/01/2020  ? Procedure: CRANIOTOMY FOR TUMOR EXCISION;  Surgeon: [name], MD;  Location: [place];  Service: Neurosurgery;  Laterality: N/A;  ? FRACTURE SURGERY    ? left foot,baby toe nad next toe missing  ? INSERTION OF DIALYSIS CATHETER Right 10/30/2014  ? Procedure: INSERTION OF DIALYSIS CATHETER;  Surgeon: Rosetta Posner, MD;  Location: [place];  Service: Vascular;  Laterality: Right;  ? KIDNEY TRANSPLANT  11/02/2008  ? [facility])  ? PLACEMENT OF LUMBAR DRAIN N/A 11/01/2020  ? Procedure: PLACEMENT OF LUMBAR DRAIN;  Surgeon: [name], MD;  Location: [place];  Service: Neurosurgery;  Laterality: N/A;  ? WISDOM TOOTH EXTRACTION    ? ? ?Prior to Admission medications   ?Medication Sig Start Date End Date Taking? Authorizing Provider  ?albuterol (VENTOLIN HFA) 108 (90 Base) MCG/ACT inhaler Inhale 3 puffs into the lungs daily as needed for wheezing or shortness of breath.   Yes [provider]  ?amLODipine (NORVASC) 10 MG tablet Take 1 tablet (10 mg total) by mouth daily. 07/02/21  Yes Lajean Manes, MD  ?aspirin 81 MG EC tablet Take 81 mg by mouth as needed for pain. 05/15/15  Yes [provider]  ?Aspirin-Caffeine (BAYER BACK & BODY PO) Take 1 tablet by mouth daily as needed (pain).   Yes [provider]  ?carvedilol (COREG) 12.5 MG tablet Take 1 tablet (12.5 mg total) by mouth 2 (two) times daily with a meal. 12/20/20 12/15/21 Yes Gaylan Gerold, DO  ?Cholecalciferol (VITAMIN D3 PO) Take 1 tablet by mouth daily.   Yes [provider]  ?cloNIDine (CATAPRES) 0.1 MG tablet Take 1 tablet (0.1 mg total) by mouth 2 (two) times daily. 10/11/21 10/11/22 Yes Gaylan Gerold, DO  ?diphenhydrAMINE (BENADRYL) 25 mg capsule Take 25 mg by mouth at bedtime as needed for sleep.   Yes [provider]  ?ibuprofen (ADVIL) 200 MG tablet Take 200 mg by mouth every 6 (six) hours as needed for mild pain.   Yes [provider]  ?lidocaine-prilocaine (EMLA) cream Apply 1 application. topically every Monday, Wednesday, and Friday with hemodialysis. 02/01/20   Yes [provider]  ?Polyethylene Glycol 400 (BLINK TEARS OP) Place 1 drop into both eyes daily as needed (tired eyes).   Yes [provider]  ?sertraline (ZOLOFT) 50 MG tablet Take 1 tablet (50 mg total) by mouth daily. 07/02/21 07/02/22 Yes Lajean Manes, MD  ?sevelamer carbonate (RENVELA) 800 MG tablet Take 2,400 mg by mouth 3 (three) times daily with meals. 10/07/21  Yes [provider]  ?levETIRAcetam (KEPPRA) 250 MG tablet Take 1 tablet (250 mg total) by mouth 2 (two) times daily for 9 days. ?Patient not taking:  Reported on 10/22/2021 11/10/20 11/19/20  Gaylan Gerold, DO  ?polyethylene glycol (MIRALAX / GLYCOLAX) 17 g packet Take 17 g by mouth daily. ?Patient not taking: Reported on 10/22/2021 10/11/19   Molli Hazard A, DO  ? ? ?Scheduled Meds: ? [START ON 10/23/2021] Chlorhexidine Gluconate Cloth  6 each Topical Q0600  ? pantoprazole (PROTONIX) IV  40 mg Intravenous Q12H  ? ?Infusions: ? ?PRN Meds: ? ? ? ?Allergies as of 10/22/2021 - Review Complete 10/22/2021  ?Allergen Reaction Noted  ? Penicillins Itching and Rash 02/02/2013  ? ? ?Family History  ?Problem Relation Age of Onset  ? Hypertension Mother   ? Hypertension Father   ? Hypertension Sister   ? Hyperlipidemia Sister   ? Hypertension Sister   ? Diabetes Brother   ? Deep vein thrombosis Brother   ? Kidney disease Brother   ?     on HD  ? Colon cancer Neg Hx   ? Esophageal cancer Neg Hx   ? Rectal cancer Neg Hx   ? Breast cancer Neg Hx   ? ? ?Social History  ? ?Socioeconomic History  ? Marital status: Single  ?  Spouse name: Not on file  ? Number of children: 1  ? Years of education: Not on file  ? Highest education level: Not on file  ?Occupational History  ? Occupation: unemployed  ?Tobacco Use  ? Smoking status: Every Day  ?  Packs/day: 0.50  ?  Years: 27.00  ?  Pack years: 13.50  ?  Types: Cigarettes  ? Smokeless tobacco: Never  ? Tobacco comments:  ?  10 cigarettes a day  ?Vaping Use  ? Vaping Use: Never used  ?Substance and  Sexual Activity  ? Alcohol use: No  ?  Alcohol/week: 0.0 standard drinks  ?  Comment: occasional drinker noted in the past  ? Drug use: No  ? Sexual activity: Yes  ?  Partners: Male  ?  Birth control/pro

## 2021-10-22 NOTE — Progress Notes (Signed)
This RN checked to see if pt's H&H was rechecked after receiving transfusions of 2 PRBCs for hgb 4.1. 1632 labs showed hgb 5.4. This RN did not receive call for critical lab result. Family medicine was paged immediately. See new orders.  ? ?Justice Rocher, RN ? ?

## 2021-10-22 NOTE — ED Provider Triage Note (Signed)
Emergency Medicine Provider Triage Evaluation Note ? ?Ruth Gutierrez , a 56 y.o. female  was evaluated in triage.  Pt complains of chest pain.  ESRD on HD, schedule MWF-- got full treatment yesterday but states they have been pulling off less fluid than usual.  No cough/fever. ? ?Review of Systems  ?Positive: Chest pain ?Negative: fever ? ?Physical Exam  ?BP (!) 141/82 (BP Location: Right Arm)   Pulse 97   Temp 98.4 ?F (36.9 ?C) (Oral)   Resp 19   LMP 06/02/2015   SpO2 97%  ? ?Gen:   Awake, no distress   ?Resp:  Normal effort  ?MSK:   Moves extremities without difficulty  ?Other:  Fistula LUE ? ?Medical Decision Making  ?Medically screening exam initiated at 5:47 AM.  Appropriate orders placed.  Ruth Gutierrez was informed that the remainder of the evaluation will be completed by another provider, this initial triage assessment does not replace that evaluation, and the importance of remaining in the ED until their evaluation is complete. ? ?Chest pain.  ESRD on HD, had treatment yesterday.  EKG, labs, CXR. ?  ?Larene Pickett, PA-C ?10/22/21 [id number] ? ?

## 2021-10-22 NOTE — ED Triage Notes (Signed)
Pt c/o L sided chest pain that started after dialysis yesterday. MWF dialysis, last treatment was yesterday.  ?

## 2021-10-22 NOTE — Consult Note (Signed)
Renal Service ?Consult Note ?[facility] ? ?Ruth Gutierrez ?10/22/2021 ?Sol Blazing, MD ?Requesting Physician: Dr Cain Sieve ? ?Reason for Consult: ESRD pt w/ chest pain  ?HPI: The patient is a 56 y.o. year-old w/ hx of ESRD on HD, hx failed renal transplant, anemia of CKD, who presented to ED early am today w/ chest pain. Also for 3 days has been feeling lightheaded and noticing dark stools also for about 3 days. In ED Hb was 4.1 (8.9 one month ago). Blood transfusion started x 2 units this morning. Asked to see for ESRD.   ? ?Pt seen in ED.  She is somnolent but arouses easily and is fully oriented. Goes to HD MWF, had HD yesterday. [facility].  ESRD > 10 y rs. Had renal transplant in 2010 at [facility]. Went back on HD in 2016.  Daughter at bedside. Pt provided most of the history.  ? ? ?ROS - denies CP, no joint pain, no HA, no blurry vision, no rash, no diarrhea, no nausea/ vomiting ? ? ?Past Medical History  ?Past Medical History:  ?Diagnosis Date  ? Anemia of chronic disease   ? Arthritis   ? Deceased-donor kidney transplant   ? Performed at [facility], April 2010.  Initial ESRD due to HTN nephropathy  ? Eczema   ? ESRD (end stage renal disease) ([place])   ? s/p transplant creatinine baseline 1.1  M/W/F dialysis  ? FUO (fever of unknown origin) 05/17/2015  ? GERD (gastroesophageal reflux disease)   ? Headache(784.0)   ? History of hyperparathyroidism   ? Hypertension   ? Peritonitis ([place]) 10/2019  ? Shortness of breath   ? Wears glasses   ? ?Past Surgical History  ?Past Surgical History:  ?Procedure Laterality Date  ? A/V FISTULAGRAM Left 02/12/2017  ? Procedure: A/V Fistulagram;  Surgeon: Algernon Huxley, MD;  Location: [place] [facility];  Service: Cardiovascular;  Laterality: Left;  ? A/V FISTULAGRAM Left 12/30/2017  ? Procedure: A/V FISTULAGRAM;  Surgeon: Algernon Huxley, MD;  Location: [place] [facility];  Service: Cardiovascular;  Laterality: Left;  ? A/V FISTULAGRAM Left 04/01/2021  ?  Procedure: A/V FISTULAGRAM;  Surgeon: Algernon Huxley, MD;  Location: [place] [facility];  Service: Cardiovascular;  Laterality: Left;  ? A/V SHUNT INTERVENTION N/A 02/12/2017  ? Procedure: A/V Shunt Intervention;  Surgeon: Algernon Huxley, MD;  Location: [place] [facility];  Service: Cardiovascular;  Laterality: N/A;  ? AV FISTULA PLACEMENT    ? BASCILIC VEIN TRANSPOSITION Left 10/30/2014  ? Procedure: LEFT BASCILIC VEIN TRANSPOSITION;  Surgeon: Rosetta Posner, MD;  Location: [place];  Service: Vascular;  Laterality: Left;  ? BASCILIC VEIN TRANSPOSITION Left 01/03/2015  ? Procedure: LEFT ARM 2ND STAGE [place];  Surgeon: Rosetta Posner, MD;  Location: [place];  Service: Vascular;  Laterality: Left;  ? BREAST BIOPSY Left   ? Patient doesn't remember any information from previous procedure.  ? CAPD REMOVAL N/A 10/31/2019  ? Procedure: PERITONEAL DIALYSIS  (CAPD) INFECTED CATHETER REMOVAL;  Surgeon: Coralie Keens, MD;  Location: [place];  Service: General;  Laterality: N/A;  ? CRANIOTOMY N/A 11/01/2020  ? Procedure: CRANIOTOMY FOR TUMOR EXCISION;  Surgeon: [name], MD;  Location: [place];  Service: Neurosurgery;  Laterality: N/A;  ? FRACTURE SURGERY    ? left foot,baby toe nad next toe missing  ? INSERTION OF DIALYSIS CATHETER Right 10/30/2014  ? Procedure: INSERTION OF DIALYSIS CATHETER;  Surgeon:  Rosetta Posner, MD;  Location: [place];  Service: Vascular;  Laterality: Right;  ? KIDNEY TRANSPLANT  11/02/2008  ? [facility])  ? PLACEMENT OF LUMBAR DRAIN N/A 11/01/2020  ? Procedure: PLACEMENT OF LUMBAR DRAIN;  Surgeon: [name], MD;  Location: [place];  Service: Neurosurgery;  Laterality: N/A;  ? WISDOM TOOTH EXTRACTION    ? ?Family History  ?Family History  ?Problem Relation Age of Onset  ? Hypertension Mother   ? Hypertension Father   ? Hypertension Sister   ? Hyperlipidemia Sister   ? Hypertension Sister   ? Diabetes Brother   ? Deep vein thrombosis Brother   ? Kidney disease Brother    ?     on HD  ? Colon cancer Neg Hx   ? Esophageal cancer Neg Hx   ? Rectal cancer Neg Hx   ? Breast cancer Neg Hx   ? ?Social History  reports that she has been smoking cigarettes. She has a 13.50 pack-year smoking history. She has never used smokeless tobacco. She reports that she does not drink alcohol and does not use drugs. ?Allergies  ?Allergies  ?Allergen Reactions  ? Penicillins Itching and Rash  ?  Did it involve swelling of the face/tongue/throat, SOB, or low BP?Y ?Did it involve sudden or severe rash/hives, skin peeling, or any reaction on the inside of your mouth or nose? Y ?Did you need to seek medical attention at a hospital or doctor's office? Y ?When did it last happen?  2016     ?If all above answers are "NO", may proceed with cephalosporin use.  ? ?Home medications ?Prior to Admission medications   ?Medication Sig Start Date End Date Taking? Authorizing Provider  ?albuterol (VENTOLIN HFA) 108 (90 Base) MCG/ACT inhaler Inhale 3 puffs into the lungs daily as needed for wheezing or shortness of breath.   Yes [provider]  ?amLODipine (NORVASC) 10 MG tablet Take 1 tablet (10 mg total) by mouth daily. 07/02/21  Yes Lajean Manes, MD  ?aspirin 81 MG EC tablet Take 81 mg by mouth as needed for pain. 05/15/15  Yes [provider]  ?Aspirin-Caffeine (BAYER BACK & BODY PO) Take 1 tablet by mouth daily as needed (pain).   Yes [provider]  ?carvedilol (COREG) 12.5 MG tablet Take 1 tablet (12.5 mg total) by mouth 2 (two) times daily with a meal. 12/20/20 12/15/21 Yes Gaylan Gerold, DO  ?Cholecalciferol (VITAMIN D3 PO) Take 1 tablet by mouth daily.   Yes [provider]  ?cloNIDine (CATAPRES) 0.1 MG tablet Take 1 tablet (0.1 mg total) by mouth 2 (two) times daily. 10/11/21 10/11/22 Yes Gaylan Gerold, DO  ?diphenhydrAMINE (BENADRYL) 25 mg capsule Take 25 mg by mouth at bedtime as needed for sleep.   Yes [provider]  ?ibuprofen (ADVIL) 200 MG tablet Take 200 mg by  mouth every 6 (six) hours as needed for mild pain.   Yes [provider]  ?lidocaine-prilocaine (EMLA) cream Apply 1 application. topically every Monday, Wednesday, and Friday with hemodialysis. 02/01/20  Yes [provider]  ?Polyethylene Glycol 400 (BLINK TEARS OP) Place 1 drop into both eyes daily as needed (tired eyes).   Yes [provider]  ?sertraline (ZOLOFT) 50 MG tablet Take 1 tablet (50 mg total) by mouth daily. 07/02/21 07/02/22 Yes Lajean Manes, MD  ?sevelamer carbonate (RENVELA) 800 MG tablet Take 2,400 mg by mouth 3 (three) times daily with meals. 10/07/21  Yes [provider]  ?levETIRAcetam (KEPPRA)  250 MG tablet Take 1 tablet (250 mg total) by mouth 2 (two) times daily for 9 days. ?Patient not taking: Reported on 10/22/2021 11/10/20 11/19/20  Gaylan Gerold, DO  ?polyethylene glycol (MIRALAX / GLYCOLAX) 17 g packet Take 17 g by mouth daily. ?Patient not taking: Reported on 10/22/2021 10/11/19   Molli Hazard A, DO  ? ? ? ?Vitals:  ? 10/22/21 1115 10/22/21 1130 10/22/21 1145 10/22/21 1200  ?BP: 114/61 120/72 127/74 136/87  ?Pulse:      ?Resp: '16 16 19 18  '$ ?Temp:      ?TempSrc:      ?SpO2:      ? ?Exam ?Gen alert, no distress ?Puffy eyes bilat ?+JVD ?Chest clear bilat to bases, some rhonchi L side, no wheezing or rales ?RRR no RG ?Abd soft ntnd no mass or ascites +bs ?GU defer ?MS no joint effusions or deformity ?Ext trace- 1+ pretib edema bilat ?Neuro is alert, Ox 3 , nf ?   LUA AVF+bruit ? ? ? ? ? Home meds include - norvasc 10, asa, coreg 1.25 bid, clonidine 0.1 bid, zoloft, keppra, renvela 3 ac tid, prns/ vits / supps ? ? CXR - no acute disease ? ? [facility] HD: [facility] [place] ? 4h 400/500  71.5kg   2/2 bath  AVF  LUA   Hep none ? - hectorol 5 ug tiw ? - venofer '100mg'$  q HD, 3 of 10 left ? ? ?Assessment/ Plan: ?Symptomatic anemia - Hb 4.1 in ED. 3 days of dark stools, +abd symptoms, no anticoagulants. FOB here is +. Getting 2u prbc's now.  If she needs more blood transfusions would  consider giving on HD tomorrow. Per pmd.   ?Chest pain - w/u neg,  per ED/ pmd.  ?ESRD: MWF HD. HD tomorrow. High BUN due to upper GIB most likely. ?Volume/ hypertension: looks volume ^ on exam, but no resp iss

## 2021-10-22 NOTE — H&P (Signed)
?Date: 10/22/2021     ?     ?     ?Patient Name:  Ruth Gutierrez MRN: [id number]  ?DOB: 03/29/1966 Age / Sex: 56 y.o., female   ?PCP: Gaylan Gerold, DO    ?     ?Medical Service: Internal Medicine Teaching Service    ?     ?Attending Physician: Dr. Lottie Mussel, MD    ?First Contact: Tamsen Snider  Pager: [phone]  ?     ?     ?After Hours (After 5p/  First Contact Pager: [phone]  ?weekends / holidays): Second Contact Pager: [phone]  ? ?SUBJECTIVE  ? ?Chief Complaint: Chest pain ? ?History of Present Illness: Ruth Gutierrez is a 56 year old living with major depressive disorder, history of failed kidney transplant, ESRD on HD MWF, and anemia of chronic kidney disease who presented with chest pain.  Patient had hemodialysis on 3/20 and started to have left sided chest pain when she got home. Chest pain came on gradually and present at rest. The pain radiated down her left arm and this arm felt numb.  For 3 days she has been feeling lightheaded and associates this with dark stools which also started 3 days ago. No history of dark stools or GI bleeding.  For a year now she has been experiencing belching and burping. She has discomfort after eating food and feels nauseas.  ? ?Review of Systems  ?Constitutional:  Negative for fever.  ?HENT:  Positive for nosebleeds and sinus pain. Negative for sore throat.   ?     Edema of eyes  ?Eyes:  Negative for blurred vision (does not have glasses with her), pain and redness.  ?Respiratory:  Positive for shortness of breath. Negative for cough.   ?Cardiovascular:  Positive for chest pain. Negative for palpitations.  ?Gastrointestinal:  Positive for heartburn. Negative for nausea and vomiting.  ?Genitourinary:  Negative for frequency and urgency.  ?Musculoskeletal:  Negative for falls and joint pain.  ?Skin:  Negative for itching and rash.  ?Neurological:  Positive for dizziness. Negative for focal weakness and loss of consciousness.  ?Endo/Heme/Allergies:  Does not  bruise/bleed easily.  ?Psychiatric/Behavioral:  Negative for substance abuse.   ? ?Meds:  ? ?Current Facility-Administered Medications:  ?  pantoprazole (PROTONIX) injection 40 mg, 40 mg, Intravenous, Q12H, Madalyn Rob, MD, 40 mg at 10/22/21 0844 ? ?Current Outpatient Medications:  ?  amLODipine (NORVASC) 10 MG tablet, Take 1 tablet (10 mg total) by mouth daily., Disp: 90 tablet, Rfl: 2 ?  aspirin 81 MG EC tablet, Take 81 mg by mouth as needed for pain., Disp: , Rfl:  ?  carvedilol (COREG) 12.5 MG tablet, Take 1 tablet (12.5 mg total) by mouth 2 (two) times daily with a meal., Disp: 180 tablet, Rfl: 3 ?  cholecalciferol (VITAMIN D3) 25 MCG (1000 UNIT) tablet, Take 1,000 Units by mouth daily., Disp: , Rfl:  ?  cloNIDine (CATAPRES) 0.1 MG tablet, Take 1 tablet (0.1 mg total) by mouth 2 (two) times daily., Disp: 180 tablet, Rfl: 3 ?  dexamethasone (DECADRON) 1 MG tablet, Take 1 tablet (1 mg total) by mouth every 12 (twelve) hours. Take 1 tablet twice daily then reduce to 1 tablet daily on 11/11/2020 for 3 more days. (Patient not taking: Reported on 04/01/2021), Disp: 5 tablet, Rfl: 0 ?  levETIRAcetam (KEPPRA) 250 MG tablet, Take 1 tablet (250 mg total) by mouth 2 (two) times daily for 9 days., Disp: 18 tablet, Rfl: 0 ?  lidocaine-prilocaine (  EMLA) cream, Apply 1 application topically every Monday, Wednesday, and Friday with hemodialysis. (Patient not taking: Reported on 04/01/2021), Disp: , Rfl:  ?  omeprazole (PRILOSEC OTC) 20 MG tablet, Take 20 mg by mouth daily as needed for heartburn., Disp: , Rfl:  ?  polyethylene glycol (MIRALAX / GLYCOLAX) 17 g packet, Take 17 g by mouth daily., Disp: 30 each, Rfl: 0 ?  sertraline (ZOLOFT) 50 MG tablet, Take 1 tablet (50 mg total) by mouth daily., Disp: 90 tablet, Rfl: 2 ?  sevelamer carbonate (RENVELA) 800 MG tablet, Take 1,600 mg by mouth 3 (three) times daily., Disp: , Rfl:   ? ? ?Past Medical History:  ?Diagnosis Date  ? Anemia of chronic disease   ? Arthritis   ?  Deceased-donor kidney transplant   ? Performed at [facility], April 2010.  Initial ESRD due to HTN nephropathy  ? Eczema   ? ESRD (end stage renal disease) ([place])   ? s/p transplant creatinine baseline 1.1  M/W/F dialysis  ? FUO (fever of unknown origin) 05/17/2015  ? GERD (gastroesophageal reflux disease)   ? Headache(784.0)   ? History of hyperparathyroidism   ? Hypertension   ? Peritonitis ([place]) 10/2019  ? Shortness of breath   ? Wears glasses   ? ? ?Past Surgical History:  ?Procedure Laterality Date  ? A/V FISTULAGRAM Left 02/12/2017  ? Procedure: A/V Fistulagram;  Surgeon: Algernon Huxley, MD;  Location: [place] [facility];  Service: Cardiovascular;  Laterality: Left;  ? A/V FISTULAGRAM Left 12/30/2017  ? Procedure: A/V FISTULAGRAM;  Surgeon: Algernon Huxley, MD;  Location: [place] [facility];  Service: Cardiovascular;  Laterality: Left;  ? A/V FISTULAGRAM Left 04/01/2021  ? Procedure: A/V FISTULAGRAM;  Surgeon: Algernon Huxley, MD;  Location: [place] [facility];  Service: Cardiovascular;  Laterality: Left;  ? A/V SHUNT INTERVENTION N/A 02/12/2017  ? Procedure: A/V Shunt Intervention;  Surgeon: Algernon Huxley, MD;  Location: [place] [facility];  Service: Cardiovascular;  Laterality: N/A;  ? AV FISTULA PLACEMENT    ? BASCILIC VEIN TRANSPOSITION Left 10/30/2014  ? Procedure: LEFT BASCILIC VEIN TRANSPOSITION;  Surgeon: Rosetta Posner, MD;  Location: [place];  Service: Vascular;  Laterality: Left;  ? BASCILIC VEIN TRANSPOSITION Left 01/03/2015  ? Procedure: LEFT ARM 2ND STAGE [place];  Surgeon: Rosetta Posner, MD;  Location: [place];  Service: Vascular;  Laterality: Left;  ? BREAST BIOPSY Left   ? Patient doesn't remember any information from previous procedure.  ? CAPD REMOVAL N/A 10/31/2019  ? Procedure: PERITONEAL DIALYSIS  (CAPD) INFECTED CATHETER REMOVAL;  Surgeon: Coralie Keens, MD;  Location: [place];  Service: General;  Laterality: N/A;  ? CRANIOTOMY N/A 11/01/2020  ? Procedure: CRANIOTOMY FOR  TUMOR EXCISION;  Surgeon: [name], MD;  Location: [place];  Service: Neurosurgery;  Laterality: N/A;  ? FRACTURE SURGERY    ? left foot,baby toe nad next toe missing  ? INSERTION OF DIALYSIS CATHETER Right 10/30/2014  ? Procedure: INSERTION OF DIALYSIS CATHETER;  Surgeon: Rosetta Posner, MD;  Location: [place];  Service: Vascular;  Laterality: Right;  ? KIDNEY TRANSPLANT  11/02/2008  ? [facility])  ? PLACEMENT OF LUMBAR DRAIN N/A 11/01/2020  ? Procedure: PLACEMENT OF LUMBAR DRAIN;  Surgeon: [name], MD;  Location: [place];  Service: Neurosurgery;  Laterality: N/A;  ? WISDOM TOOTH EXTRACTION    ? ? 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  ?Level of Function: Completes own ADLs ?PCP: Cain Saupe  ?Substances:  ?Social History  ? ?Tobacco Use  ? Smoking status: Every Day  ?  Packs/day: 0.50  ?  Years: 27.00  ?  Pack years: 13.50  ?  Types: Cigarettes  ? Smokeless tobacco: Never  ? Tobacco comments:  ?  10 cigarettes a day  ?Vaping Use  ? Vaping Use: Never used  ?Substance Use Topics  ? Alcohol use: No  ?  Alcohol/week: 0.0 standard drinks  ?  Comment: occasional drinker noted in the past  ? Drug use: No  ? ? ? ?Family History:  ?Family History  ?Problem Relation Age of Onset  ? Hypertension Mother   ? Hypertension Father   ? Hypertension Sister   ? Hyperlipidemia Sister   ? Hypertension Sister   ? Diabetes Brother   ? Deep vein thrombosis Brother   ? Kidney disease Brother   ?     on HD  ? Colon cancer Neg Hx   ? Esophageal cancer Neg Hx   ? Rectal cancer Neg Hx   ? Breast cancer Neg Hx   ? ? ? ?Allergies: ?Allergies as of 10/22/2021 - Review Complete 10/22/2021  ?Allergen Reaction Noted  ? Penicillins Itching and Rash 02/02/2013  ? ? ?OBJECTIVE:  ? ?Physical Exam: ?Blood pressure 136/71, pulse (!) 102, temperature 98.5 ?F (36.9 ?C), temperature source Temporal, resp. rate 16, last menstrual period 06/02/2015, SpO2 100 %.  ? ?General appearance: No acute distress,  appears fatigued, lying in bed with her daughter holding her left arm and massaging it ?Eyes: anicteric sclerae, pale conjunctivae; no lid-lag; PERRLA, tracking appropriately ?HENT:  MMM, no mucosal ulcerations; normal h

## 2021-10-22 NOTE — ED Notes (Signed)
Blood consent signed at bedside.  ?

## 2021-10-22 NOTE — Progress Notes (Signed)
Pt arrived to [id number] at this time. Pt belongings include: glasses, phone, and black bag of belongings.  ? ?Justice Rocher, RN ? ?

## 2021-10-22 NOTE — ED Provider Notes (Signed)
?[facility] ?Provider Note ? ? ?CSN: [id number] ?Arrival date & time: 10/22/21  0531 ? ?  ? ?History ? ?Chief Complaint  ?Patient presents with  ? Chest Pain  ? ? ?Ruth Gutierrez is a 56 y.o. female. ? ? ?Chest Pain ? ?Patient with medical history notable for QRSD with dialysis Monday, Wednesday, Friday, hypertension presents with chest pain.  Chest pain is left-sided, started yesterday after completing a full course of dialysis.  Feels sharp, its across the left side of her chest down to her breast.  Worse when she moves her left arm, associated with nausea and shortness of breath but no vomiting.  States she feels very weak and out of breath.   ? ?Of note, patient has been having dark stool for the last 3 days.  Not on blood thinners, no history of GI bleeds or gastric ulcers per the patient.  She takes a baby aspirin daily. ? ?Home Medications ?Prior to Admission medications   ?Medication Sig Start Date End Date Taking? Authorizing Provider  ?amLODipine (NORVASC) 10 MG tablet Take 1 tablet (10 mg total) by mouth daily. 07/02/21   Lajean Manes, MD  ?aspirin 81 MG EC tablet Take 81 mg by mouth as needed for pain. 05/15/15   [provider]  ?carvedilol (COREG) 12.5 MG tablet Take 1 tablet (12.5 mg total) by mouth 2 (two) times daily with a meal. 12/20/20 12/15/21  Gaylan Gerold, DO  ?cholecalciferol (VITAMIN D3) 25 MCG (1000 UNIT) tablet Take 1,000 Units by mouth daily.    [provider]  ?cloNIDine (CATAPRES) 0.1 MG tablet Take 1 tablet (0.1 mg total) by mouth 2 (two) times daily. 10/11/21 10/11/22  Gaylan Gerold, DO  ?dexamethasone (DECADRON) 1 MG tablet Take 1 tablet (1 mg total) by mouth every 12 (twelve) hours. Take 1 tablet twice daily then reduce to 1 tablet daily on 11/11/2020 for 3 more days. ?Patient not taking: Reported on 04/01/2021 11/10/20   Gaylan Gerold, DO  ?levETIRAcetam (KEPPRA) 250 MG tablet Take 1 tablet (250 mg total) by mouth 2 (two) times  daily for 9 days. 11/10/20 11/19/20  Gaylan Gerold, DO  ?lidocaine-prilocaine (EMLA) cream Apply 1 application topically every Monday, Wednesday, and Friday with hemodialysis. ?Patient not taking: Reported on 04/01/2021 02/01/20   [provider]  ?omeprazole (PRILOSEC OTC) 20 MG tablet Take 20 mg by mouth daily as needed for heartburn. 08/23/18   [provider]  ?polyethylene glycol (MIRALAX / GLYCOLAX) 17 g packet Take 17 g by mouth daily. 10/11/19   Seawell, Jaimie A, DO  ?sertraline (ZOLOFT) 50 MG tablet Take 1 tablet (50 mg total) by mouth daily. 07/02/21 07/02/22  Lajean Manes, MD  ?sevelamer carbonate (RENVELA) 800 MG tablet Take 1,600 mg by mouth 3 (three) times daily. 10/07/21   [provider]  ?   ? ?Allergies    ?Penicillins   ? ?Review of Systems   ?Review of Systems  ?Cardiovascular:  Positive for chest pain.  ? ?Physical Exam ?Updated Vital Signs ?BP 126/78   Pulse (!) 107   Temp 98.9 ?F (37.2 ?C)   Resp 12   LMP 06/02/2015   SpO2 96%  ?Physical Exam ?Vitals and nursing note reviewed. Exam conducted with a chaperone present.  ?Constitutional:   ?   General: She is not in acute distress. ?   Appearance: Normal appearance.  ?HENT:  ?   Head: Normocephalic and atraumatic.  ?   Mouth/Throat:  ?  Mouth: Mucous membranes are dry.  ?Eyes:  ?   General: No scleral icterus. ?   Extraocular Movements: Extraocular movements intact.  ?   Pupils: Pupils are equal, round, and reactive to light.  ?Cardiovascular:  ?   Rate and Rhythm: Regular rhythm. Tachycardia present.  ?   Heart sounds: Murmur heard.  ?Abdominal:  ?   General: Abdomen is flat.  ?   Tenderness: There is no abdominal tenderness.  ?Musculoskeletal:  ?   Comments: AV fistula to left arm with palpable thrill  ?Skin: ?   Coloration: Skin is pale. Skin is not jaundiced.  ?Neurological:  ?   Mental Status: She is alert. Mental status is at baseline.  ?   Coordination: Coordination normal.  ? ? ?ED Results / Procedures / Treatments    ?Labs ?(all labs ordered are listed, but only abnormal results are displayed) ?Labs Reviewed  ?CBC WITH DIFFERENTIAL/PLATELET - Abnormal; Notable for the following components:  ?    Result Value  ? WBC 12.2 (*)   ? RBC 1.36 (*)   ? Hemoglobin 4.1 (*)   ? HCT 14.0 (*)   ? MCV 102.9 (*)   ? MCHC 29.3 (*)   ? RDW 20.7 (*)   ? Neutro Abs 8.6 (*)   ? Abs Immature Granulocytes 0.08 (*)   ? All other components within normal limits  ?BASIC METABOLIC PANEL - Abnormal; Notable for the following components:  ? Chloride 94 (*)   ? CO2 21 (*)   ? Glucose, Bld 119 (*)   ? BUN 147 (*)   ? Creatinine, Ser 10.35 (*)   ? Calcium 8.8 (*)   ? GFR, Estimated 4 (*)   ? Anion gap 22 (*)   ? All other components within normal limits  ?POC OCCULT BLOOD, ED - Abnormal; Notable for the following components:  ? Fecal Occult Bld POSITIVE (*)   ? All other components within normal limits  ?TROPONIN I (HIGH SENSITIVITY) - Abnormal; Notable for the following components:  ? Troponin I (High Sensitivity) 18 (*)   ? All other components within normal limits  ?TYPE AND SCREEN  ?PREPARE RBC (CROSSMATCH)  ?TROPONIN I (HIGH SENSITIVITY)  ? ? ?EKG ?None ? ?Radiology ?DG Chest 2 View ? ?Result Date: 10/22/2021 ?CLINICAL DATA:  56 year old female with history of chest pain. EXAM: CHEST - 2 VIEW COMPARISON:  Chest x-ray 11/02/2020. FINDINGS: Linear scarring or atelectasis in the left mid lung. Lung volumes are normal. No consolidative airspace disease. No pleural effusions. No pneumothorax. No pulmonary nodule or mass noted. Pulmonary vasculature and the cardiomediastinal silhouette are within normal limits. Atherosclerosis in the thoracic aorta. IMPRESSION: 1. No radiographic evidence of acute cardiopulmonary disease. 2. Linear scarring or subsegmental atelectasis in the left mid lung. 3. Aortic atherosclerosis. Electronically Signed   By: Vinnie Langton M.D.   On: 10/22/2021 06:18   ? ?Procedures ?Marland KitchenCritical Care ?Performed by: Sherrill Raring,  PA-C ?Authorized by: Sherrill Raring, PA-C  ? ?Critical care provider statement:  ?  Critical care time (minutes):  30 ?  Critical care start time:  10/22/2021 9:30 AM ?  Critical care end time:  10/22/2021 10:00 AM ?  Critical care time was exclusive of:  Separately billable procedures and treating other patients ?  Critical care was necessary to treat or prevent imminent or life-threatening deterioration of the following conditions:  Circulatory failure ?  Critical care was time spent personally by me on the following activities:  Development of  treatment plan with patient or surrogate, discussions with consultants, evaluation of patient's response to treatment, examination of patient, ordering and review of laboratory studies, ordering and review of radiographic studies, ordering and performing treatments and interventions, pulse oximetry, re-evaluation of patient's condition and review of old charts ?  Care discussed with: admitting provider    ? ? ?Medications Ordered in ED ?Medications  ?pantoprazole (PROTONIX) injection 40 mg (40 mg Intravenous Given 10/22/21 0844)  ?0.9 %  sodium chloride infusion (10 mL/hr Intravenous New Bag/Given 10/22/21 1023)  ?fentaNYL (SUBLIMAZE) injection 50 mcg (50 mcg Intravenous Given 10/22/21 0821)  ? ? ?ED Course/ Medical Decision Making/ A&P ?Clinical Course as of 10/22/21 1024  ?Tue Oct 22, 2021  ?0844 Pt is a 56 yo female presenting with hemoglobin of 4.1. Admits to melanotic stools. Minimal tachycardia. BP stable. 2 units pRBC ordered. GI consulted. Recs for consult to nephro-ESRD on dialysis. Pt will need dialysis tomorrow. Pt consented and ready for blood products.  [AG]  ?  ?Clinical Course User Index ?[AG] Lianne Cure, DO  ? ?                        ?Medical Decision Making ?Amount and/or Complexity of Data Reviewed ?Labs: ordered. ? ?Risk ?Prescription drug management. ?Decision regarding hospitalization. ? ? ?This patient presents to the ED for concern of chest pain and  rectal bleeding, this involves an extensive number of treatment options, and is a complaint that carries with it a high risk of complications and morbidity.  The differential diagnosis includes mesenteric ischemia, ACS, aor

## 2021-10-23 ENCOUNTER — Inpatient Hospital Stay (HOSPITAL_COMMUNITY): Payer: Medicare Other | Admitting: Certified Registered"

## 2021-10-23 ENCOUNTER — Encounter (HOSPITAL_COMMUNITY): Payer: Self-pay | Admitting: Internal Medicine

## 2021-10-23 ENCOUNTER — Encounter (HOSPITAL_COMMUNITY)
Admission: EM | Disposition: A | Discharge status: Home / Self Care | Payer: Self-pay | Source: Home / Self Care | Attending: Internal Medicine

## 2021-10-23 DIAGNOSIS — N186 End stage renal disease: Secondary | ICD-10-CM

## 2021-10-23 DIAGNOSIS — I120 Hypertensive chronic kidney disease with stage 5 chronic kidney disease or end stage renal disease: Secondary | ICD-10-CM

## 2021-10-23 DIAGNOSIS — K625 Hemorrhage of anus and rectum: Secondary | ICD-10-CM

## 2021-10-23 DIAGNOSIS — D649 Anemia, unspecified: Secondary | ICD-10-CM

## 2021-10-23 DIAGNOSIS — K2971 Gastritis, unspecified, with bleeding: Secondary | ICD-10-CM

## 2021-10-23 DIAGNOSIS — Z992 Dependence on renal dialysis: Secondary | ICD-10-CM

## 2021-10-23 DIAGNOSIS — K259 Gastric ulcer, unspecified as acute or chronic, without hemorrhage or perforation: Secondary | ICD-10-CM

## 2021-10-23 DIAGNOSIS — K2970 Gastritis, unspecified, without bleeding: Secondary | ICD-10-CM

## 2021-10-23 DIAGNOSIS — K250 Acute gastric ulcer with hemorrhage: Secondary | ICD-10-CM

## 2021-10-23 DIAGNOSIS — K254 Chronic or unspecified gastric ulcer with hemorrhage: Secondary | ICD-10-CM

## 2021-10-23 HISTORY — PX: BIOPSY: SHX5522

## 2021-10-23 HISTORY — PX: ESOPHAGOGASTRODUODENOSCOPY (EGD) WITH PROPOFOL: SHX5813

## 2021-10-23 LAB — TYPE AND SCREEN
ABO/RH(D): A POS
Antibody Screen: NEGATIVE
Unit division: 0
Unit division: 0
Unit division: 0
Unit division: 0

## 2021-10-23 LAB — CBC
HCT: 21.3 % — ABNORMAL LOW (ref 36.0–46.0)
HCT: 23.8 % — ABNORMAL LOW (ref 36.0–46.0)
Hemoglobin: 7.2 g/dL — ABNORMAL LOW (ref 12.0–15.0)
Hemoglobin: 8.3 g/dL — ABNORMAL LOW (ref 12.0–15.0)
MCH: 30.1 pg (ref 26.0–34.0)
MCH: 31.1 pg (ref 26.0–34.0)
MCHC: 33.8 g/dL (ref 30.0–36.0)
MCHC: 34.9 g/dL (ref 30.0–36.0)
MCV: 89.1 fL (ref 80.0–100.0)
MCV: 89.1 fL (ref 80.0–100.0)
Platelets: 124 10*3/uL — ABNORMAL LOW (ref 150–400)
Platelets: 114 10*3/uL — ABNORMAL LOW (ref 150–400)
RBC: 2.39 MIL/uL — ABNORMAL LOW (ref 3.87–5.11)
RBC: 2.67 MIL/uL — ABNORMAL LOW (ref 3.87–5.11)
RDW: 19.3 % — ABNORMAL HIGH (ref 11.5–15.5)
RDW: 19.9 % — ABNORMAL HIGH (ref 11.5–15.5)
WBC: 11 10*3/uL — ABNORMAL HIGH (ref 4.0–10.5)
WBC: 11.2 10*3/uL — ABNORMAL HIGH (ref 4.0–10.5)
nRBC: 0.4 % — ABNORMAL HIGH (ref 0.0–0.2)
nRBC: 0.3 % — ABNORMAL HIGH (ref 0.0–0.2)

## 2021-10-23 LAB — BPAM RBC
Blood Product Expiration Date: 202303222359
Blood Product Expiration Date: 202303252359
Blood Product Expiration Date: 202303252359
Blood Product Expiration Date: 202303312359
ISSUE DATE / TIME: 202303211009
ISSUE DATE / TIME: 202303211225
ISSUE DATE / TIME: 202303211930
ISSUE DATE / TIME: 202303212223
Unit Type and Rh: 6200
Unit Type and Rh: 6200
Unit Type and Rh: 6200
Unit Type and Rh: 6200

## 2021-10-23 LAB — RENAL FUNCTION PANEL
Albumin: 2.6 g/dL — ABNORMAL LOW (ref 3.5–5.0)
Anion gap: 18 — ABNORMAL HIGH (ref 5–15)
BUN: 167 mg/dL — ABNORMAL HIGH (ref 6–20)
CO2: 21 mmol/L — ABNORMAL LOW (ref 22–32)
Calcium: 9.2 mg/dL (ref 8.9–10.3)
Chloride: 95 mmol/L — ABNORMAL LOW (ref 98–111)
Creatinine, Ser: 12.91 mg/dL — ABNORMAL HIGH (ref 0.44–1.00)
GFR, Estimated: 3 mL/min — ABNORMAL LOW (ref >60)
Glucose, Bld: 101 mg/dL — ABNORMAL HIGH (ref 70–99)
Phosphorus: 6.1 mg/dL — ABNORMAL HIGH (ref 2.5–4.6)
Potassium: 5.1 mmol/L (ref 3.5–5.1)
Sodium: 134 mmol/L — ABNORMAL LOW (ref 135–145)

## 2021-10-23 LAB — HEPATITIS B SURFACE ANTIBODY, QUANTITATIVE: Hep B S AB Quant (Post): 18 m[IU]/mL (ref >9.9)

## 2021-10-23 SURGERY — ESOPHAGOGASTRODUODENOSCOPY (EGD) WITH PROPOFOL
Anesthesia: General

## 2021-10-23 MED ORDER — SEVELAMER CARBONATE 800 MG PO TABS
2400.0000 mg | ORAL_TABLET | Freq: Three times a day (TID) | ORAL | Status: DC
Start: 2021-10-24 — End: 2021-10-24
  Administered 2021-10-24 (×2): 2400 mg via ORAL
  Filled 2021-10-23 (×2): qty 3

## 2021-10-23 MED ORDER — ONDANSETRON HCL 4 MG/5ML PO SOLN
4.0000 mg | Freq: Once | ORAL | Status: AC
  Administered 2021-10-23: 4 mg via ORAL
  Filled 2021-10-23: qty 5

## 2021-10-23 MED ORDER — PROPOFOL 10 MG/ML IV BOLUS
INTRAVENOUS | Status: DC | PRN
  Administered 2021-10-23: 150 mg via INTRAVENOUS

## 2021-10-23 MED ORDER — ONDANSETRON 4 MG PO TBDP
4.0000 mg | ORAL_TABLET | Freq: Three times a day (TID) | ORAL | Status: AC
  Administered 2021-10-23 – 2021-10-24 (×2): 4 mg via ORAL
  Filled 2021-10-23 (×2): qty 1

## 2021-10-23 MED ORDER — ONDANSETRON 4 MG PO TBDP: 4.0000 mg | ORAL_TABLET | Freq: Three times a day (TID) | ORAL | Status: DC | PRN

## 2021-10-23 MED ORDER — ONDANSETRON HCL 4 MG/2ML IJ SOLN
INTRAMUSCULAR | Status: AC
  Filled 2021-10-23: qty 2

## 2021-10-23 MED ORDER — ONDANSETRON HCL 4 MG/2ML IJ SOLN
INTRAMUSCULAR | Status: DC | PRN
  Administered 2021-10-23: 4 mg via INTRAVENOUS

## 2021-10-23 MED ORDER — DEXAMETHASONE SODIUM PHOSPHATE 10 MG/ML IJ SOLN
INTRAMUSCULAR | Status: DC | PRN
  Administered 2021-10-23: 10 mg via INTRAVENOUS

## 2021-10-23 MED ORDER — LIDOCAINE 2% (20 MG/ML) 5 ML SYRINGE
INTRAMUSCULAR | Status: DC | PRN
  Administered 2021-10-23: 100 mg via INTRAVENOUS

## 2021-10-23 MED ORDER — PANTOPRAZOLE SODIUM 40 MG PO TBEC
40.0000 mg | DELAYED_RELEASE_TABLET | Freq: Two times a day (BID) | ORAL | Status: DC
  Administered 2021-10-24: 40 mg via ORAL
  Filled 2021-10-23: qty 1

## 2021-10-23 MED ORDER — SODIUM CHLORIDE 0.9 % IV SOLN: INTRAVENOUS | Status: DC | PRN

## 2021-10-23 MED ORDER — SERTRALINE HCL 50 MG PO TABS
50.0000 mg | ORAL_TABLET | Freq: Every day | ORAL | Status: DC
  Administered 2021-10-24: 50 mg via ORAL
  Filled 2021-10-23: qty 1

## 2021-10-23 MED ORDER — AMLODIPINE BESYLATE 10 MG PO TABS
10.0000 mg | ORAL_TABLET | Freq: Every day | ORAL | Status: DC
  Administered 2021-10-24: 10 mg via ORAL
  Filled 2021-10-23: qty 1

## 2021-10-23 MED ORDER — PANTOPRAZOLE SODIUM 40 MG IV SOLR
40.0000 mg | Freq: Two times a day (BID) | INTRAVENOUS | Status: AC
  Administered 2021-10-23: 40 mg via INTRAVENOUS
  Filled 2021-10-23: qty 10

## 2021-10-23 MED ORDER — CARVEDILOL 12.5 MG PO TABS
12.5000 mg | ORAL_TABLET | Freq: Two times a day (BID) | ORAL | Status: DC
  Administered 2021-10-24: 12.5 mg via ORAL
  Filled 2021-10-23: qty 1

## 2021-10-23 MED ORDER — SUCCINYLCHOLINE CHLORIDE 200 MG/10ML IV SOSY
PREFILLED_SYRINGE | INTRAVENOUS | Status: DC | PRN
  Administered 2021-10-23: 120 mg via INTRAVENOUS

## 2021-10-23 SURGICAL SUPPLY — 15 items

## 2021-10-23 NOTE — Progress Notes (Signed)
Pt off unit to dialysis. Report called by nightshift RN, Tawanna Sat. ? ?Justice Rocher, RN ? ?

## 2021-10-23 NOTE — Progress Notes (Signed)
? ?  Subjective: Patient's Hgb 4.1 on admission, only increasing to 5.4 with 2 u pRBCs. Transfused another 2u overnight, and inc to 7.2 this AM. ? ?This morning, patient reports feeling less weak and dizzy s/p transfusion. She still endorses some epigastric pain and black tarry stools, with the last one yesterday just prior to admission.  ? ?ADDENDUM: Spoke with patient's daughter, Ruth Gutierrez, and updated her on hospital course and EGD findings. Discussed need for PPI use going forward and avoidance of NSAID use. ? ?Objective: ? ?Vital signs in last 24 hours: ?Vitals:  ? 10/23/21 1402 10/23/21 1415 10/23/21 1435 10/23/21 1544  ?BP: (!) 150/80 (!) 151/76 (!) 142/89 (!) 165/92  ?Pulse:   97 94  ?Resp: '19 19 18 14  '$ ?Temp:   97.9 ?F (36.6 ?C) 98.8 ?F (37.1 ?C)  ?TempSrc:   Oral Oral  ?SpO2: 100% 100% 100% 100%  ?Weight:      ?Height:      ? ?Physical Exam: ?General: chronically ill-appearing woman in NAD ?HENT: normocephalic, atraumatic, external nares and ears appear normal ?EYES: conjunctiva non-erythematous, no scleral icterus ?CV: regular rate, normal rhythm, no murmurs, rubs, gallops. Non-edematous LE. ?Pulmonary: normal work of breathing on RA, lungs clear to auscultation bilaterally ?Abdominal: non-distended, soft, mild epigastric and RLQ tenderness to palpation, normal BS all 4 quadrants ?Skin: Warm and dry; normal skin turgor ?Neurological: Awake, alert and oriented x3. ?Psych: normal affect and behavior  ? ?Assessment/Plan: ? ?Principal Problem: ?  Symptomatic anemia ?Active Problems: ?  ESRD on hemodialysis ([place]) ?  Depression ?  Rectal bleed ? ? ?Ruth Gutierrez is a 56 year old female living with MDD, hx of failed kidney transplant (2010), ESRD on HD MWF, and anemia of CKD who presented with chest pain which has since resolved and admitted for symptomatic anemia. ? ?Symptomatic anemia 2/2 UGIB ?Gastric ulcer with surrounding erosive gastritis ?Noted dark, tarry stools for several days and hemoglobin found  to be 4.1 on admission. Has increased to 8.4 s/p 4u pRBCs. No additional GI bleeding since admission. Underwent EGD with GI today, which revealed deep non-bleeding gastric ulcer with surrounding erosive gastritis. Pathologies pending. Advanced to soft diet for tonight as patient is hungry and tolerated popsicle. On IV protonix '40mg'$  BID, will transition to PO tomorrow. Will need PO protonix '40mg'$  BID x8 weeks, then daily. Will need repeat EGD in 3 months to ensure healing per GI. Anticipate discharge tomorrow if remains stable and tolerating meals. ?-greatly appreciate GI assistance ?-IV PPI BID, transition to PO BID tomorrow ?-dysphagia 3 diet, advance to regular tomorrow if tolerated ?-zofran for nausea ?-will need repeat EGD in 3 months per GI ?-repeat CBC tomorrow AM ?-transfuse for Hgb <7 and/or hemodynamic instability ?-avoid NSAIDs going forward, discussed this with patient's daughter as well ? ?ESRD, on HD MWF ?Nephrology following, appreciate assistance. Was dialyzed this AM, tolerated well.  ? ?MDD ?-restarted home zoloft ? ?HTN ?BP elevated today and intermittently tachycardic. ?-restarted home norvasc and coreg ? ? ?Prior to Admission Living Arrangement: Home ?Anticipated Discharge Location: Home ?Barriers to Discharge: continued medical management ?Dispo: Anticipated discharge in approximately 1-2 day(s).  ? ?[name], MD ?10/23/2021, 5:27 PM ?Pager: [phone] ?After 5pm on weekdays and 1pm on weekends: On Call pager [phone]  ?

## 2021-10-23 NOTE — Transfer of Care (Signed)
Immediate Anesthesia Transfer of Care Note ? ?Patient: Ruth Gutierrez APOLID-CVUD ? ?Procedure(s) Performed: ESOPHAGOGASTRODUODENOSCOPY (EGD) WITH PROPOFOL ? ?Patient Location: Endoscopy Unit ? ?Anesthesia Type:General ? ?Level of Consciousness: awake, alert  and oriented ? ?Airway & Oxygen Therapy: Patient Spontanous Breathing ? ?Post-op Assessment: Report given to RN and Post -op Vital signs reviewed and stable ? ?Post vital signs: Reviewed and stable ? ?Last Vitals:  ?Vitals Value Taken Time  ?BP 154/75 10/23/21 1358  ?Temp 36.6 ?C 10/23/21 1358  ?Pulse 105 10/23/21 1358  ?Resp 19 10/23/21 1402  ?SpO2 100 % 10/23/21 1358  ?Vitals shown include unvalidated device data. ? ?Last Pain:  ?Vitals:  ? 10/23/21 1358  ?TempSrc: Temporal  ?PainSc:   ?   ? ?Patients Stated Pain Goal: 2 (10/23/21 0547) ? ?Complications: No notable events documented. ?

## 2021-10-23 NOTE — Progress Notes (Signed)
Report received from dialysis RN, Ebony Hail. Pt to be taken directly to EGD. This RN updated pt's daughter, Shona Needles.  ? ?Justice Rocher, RN ? ?

## 2021-10-23 NOTE — Interval H&P Note (Signed)
History and Physical Interval Note: ? ?10/23/2021 ?12:42 PM ? ?Ruth Gutierrez  has presented today for surgery, with the diagnosis of Reports of dark stool.  Nausea with nonbloody emesis.  Significant anemia..  The various methods of treatment have been discussed with the patient and family. After consideration of risks, benefits and other options for treatment, the patient has consented to  Procedure(s): ?ESOPHAGOGASTRODUODENOSCOPY (EGD) WITH PROPOFOL (N/A) as a surgical intervention.  The patient's history has been reviewed, patient examined, no change in status, stable for surgery.  I have reviewed the patient's chart and labs.  Questions were answered to the patient's satisfaction.   ? ? ?Jackquline Denmark ? ? ?

## 2021-10-23 NOTE — Op Note (Signed)
[facility] ?Patient Name: Ruth Gutierrez ?Procedure Date : 10/23/2021 ?MRN: [id number] ?Attending MD: Jackquline Denmark , MD ?Date of Birth: 1966/06/01 ?CSN: [id number] ?Age: 56 ?Admit Type: Inpatient ?Procedure:                Upper GI endoscopy ?Indications:              N/V/CGE/melena with acute on chronic anemia. Hb 4  ?                          of adm s/p 2U to 7.0. ?Providers:                Jackquline Denmark, MD, Katina Degree, RN, Alphonzo Grieve  ?                          Leighton Roach, Technician ?Referring MD:              ?Medicines:                General Anesthesia ?Complications:            No immediate complications. ?Estimated Blood Loss:     Estimated blood loss: none. ?Procedure:                Pre-Anesthesia Assessment: ?                          - Prior to the procedure, a History and Physical  ?                          was performed, and patient medications and  ?                          allergies were reviewed. The patient's tolerance of  ?                          previous anesthesia was also reviewed. The risks  ?                          and benefits of the procedure and the sedation  ?                          options and risks were discussed with the patient.  ?                          All questions were answered, and informed consent  ?                          was obtained. Prior Anticoagulants: The patient has  ?                          taken no previous anticoagulant or antiplatelet  ?                          agents. ASA Grade Assessment: III - A patient with  ?  severe systemic disease. After reviewing the risks  ?                          and benefits, the patient was deemed in  ?                          satisfactory condition to undergo the procedure. ?                          After obtaining informed consent, the endoscope was  ?                          passed under direct vision. Throughout the  ?                          procedure, the patient's blood  pressure, pulse, and  ?                          oxygen saturations were monitored continuously. The  ?                          [id number]-[id number] ([id number]) Olympus endoscope was introduced  ?                          through the mouth, and advanced to the second part  ?                          of duodenum. The upper GI endoscopy was  ?                          accomplished without difficulty. The patient  ?                          tolerated the procedure well. ?Scope In: ?Scope Out: ?Findings: ?     The examined esophagus was normal with well-defined Z-line at 38 cm,  ?     examined by NBI. Incidental inlet patch was noted in the proximal  ?     esophagus (nl variant) ?     One deep non-bleeding cratered gastric ulcer with clean whitish base and  ?     no stigmata of bleeding was found in the gastric antrum. The lesion was  ?     10 mm in largest dimension. Biopsies were taken with a cold forceps for  ?     histology. ?     Scattered moderate inflammation characterized by erosions and erythema  ?     was found in the gastric antrum and at the pylorus. No outlet  ?     obstruction. Stomach was patulous. Bile was present in the stomach which  ?     was aspirated ?     The examined duodenum was normal. ?Impression:               - Gastric ulcer with surrounding erosive gastritis. ?                          - Patulous stomach without  outlet obstruction. ?Recommendation:           - Return patient to hospital ward for ongoing care. ?                          - Full liquid diet. Advance as tolerated. ?                          - Follow biopsies. ?                          - IV Protonix. Once able to tolerate p.o., Protonix  ?                          40 mg p.o. twice daily x 8 weeks, then once a day ?                          - Recommend repeating EGD in 12 weeks to ensure  ?                          healing. ?                          - Recommend around-the-clock zofran ODT or IV x 24  ?                          hours then as  needed ?                          - If persistent problems with N/V, obtain  ?                          solid-phase gastric emptying scan as outpatient ?                          - Continue present medications. ?                          - Avoid aspirin, ibuprofen, naproxen, or other  ?                          non-steroidal anti-inflammatory drugs. ?                          - The findings and recommendations were discussed  ?                          with the patient. ?Procedure Code(s):        --- Professional --- ?                          [phone], Esophagogastroduodenoscopy, flexible,  ?                          transoral; with biopsy, single or multiple ?Diagnosis Code(s):        --- Professional --- ?  K25.9, Gastric ulcer, unspecified as acute or  ?                          chronic, without hemorrhage or perforation ?                          K29.70, Gastritis, unspecified, without bleeding ?                          I78.67, Cyclical vomiting syndrome unrelated to  ?                          migraine ?                          K92.1, Melena (includes Hematochezia) ?CPT copyright 2019 [employer]. All rights reserved. ?The codes documented in this report are preliminary and upon coder review may  ?be revised to meet current compliance requirements. ?Jackquline Denmark, MD ?10/23/2021 1:55:02 PM ?This report has been signed electronically. ?Number of Addenda: 0 ?

## 2021-10-23 NOTE — Anesthesia Postprocedure Evaluation (Signed)
Anesthesia Post Note ? ?Patient: Ruth Gutierrez [id number] ? ?Procedure(s) Performed: ESOPHAGOGASTRODUODENOSCOPY (EGD) WITH PROPOFOL ? ?  ? ?Patient location during evaluation: PACU ?Anesthesia Type: General ?Level of consciousness: awake ?Pain management: pain level controlled ?Vital Signs Assessment: post-procedure vital signs reviewed and stable ?Respiratory status: spontaneous breathing ?Cardiovascular status: stable ?Postop Assessment: no apparent nausea or vomiting ?Anesthetic complications: no ? ? ?No notable events documented. ? ?Last Vitals:  ?Vitals:  ? 10/23/21 1358 10/23/21 1402  ?BP: (!) 154/75 (!) 150/80  ?Pulse: (!) 105   ?Resp: (!) 23 19  ?Temp: 36.6 ?C   ?SpO2: 100% 100%  ?  ?Last Pain:  ?Vitals:  ? 10/23/21 1402  ?TempSrc:   ?PainSc: 0-No pain  ? ? ?  ?  ?  ?  ?  ?  ? ?Huston Foley ? ? ? ? ?

## 2021-10-23 NOTE — Procedures (Signed)
Net UF 2676, Pre BP 164/79 Post BP 144/87, Post wt is 74.6kg. Pt complained of nausea and vomiting, 100 mls Normal Saline given as well as '4mg'$  Zofran given. both per Owens Shark NP. Following completion of treatment, Pt was deaccessed and pressure held for 30 minutes due to excessive bleeding noted.  Gauze dressing with Surgifoam applied and taped securely using plastic tape. She was immediately transported to EEG in stable condition. No distress was noted. ?

## 2021-10-23 NOTE — Progress Notes (Signed)
Pt back to unit. ? ?Justice Rocher, RN ? ?

## 2021-10-23 NOTE — Anesthesia Procedure Notes (Signed)
Procedure Name: Intubation ?Date/Time: 10/23/2021 1:28 PM ?Performed by: Griffin Dakin, CRNA ?Pre-anesthesia Checklist: Patient identified, Emergency Drugs available, Suction available and Patient being monitored ?Patient Re-evaluated:Patient Re-evaluated prior to induction ?Oxygen Delivery Method: Circle system utilized ?Preoxygenation: Pre-oxygenation with 100% oxygen ?Induction Type: IV induction, Rapid sequence and Cricoid Pressure applied ?Laryngoscope Size: Mac and 3 ?Grade View: Grade I ?Tube type: Oral ?Tube size: 7.0 mm ?Number of attempts: 1 ?Airway Equipment and Method: Stylet ?Placement Confirmation: ETT inserted through vocal cords under direct vision, positive ETCO2 and breath sounds checked- equal and bilateral ?Secured at: 23 cm ?Tube secured with: Tape ?Dental Injury: Teeth and Oropharynx as per pre-operative assessment  ? ? ? ? ?

## 2021-10-23 NOTE — Progress Notes (Signed)
?[facility] ?Progress Note  ? ?Subjective: Seen on HD. Nauseated, vomited bilious phlegm. UFG reduced. Out of UF. BP 112/77. Zofran ordered for nausea. Going to endoscopy later today.  ? ?Objective ?Vitals:  ? 10/23/21 0807 10/23/21 0830 10/23/21 0900 10/23/21 0930  ?BP: (!) 177/91 (!) 160/90 126/74 120/84  ?Pulse: 100 (!) 101 (!) 101 (!) 102  ?Resp: 15 17    ?Temp:      ?TempSrc:      ?SpO2:      ?Weight:      ? ?Physical Exam ?General: Pleasant WN,WD female in NAD ?HEENT: periorbital edema present ? ?Heart: S1,S2 RRR No M/R/G ?Lungs: CTAB Anteriorly ?Abdomen: NABS, NT ?Extremities: Very trace BLE edema ?Dialysis Access: L AVF cannulated at present.  ? ? ?Additional Objective ?Labs: ?Basic Metabolic Panel: ?Recent Labs  ?Lab 10/22/21 ?3875 10/23/21 ?6433  ?NA 137 134*  ?K 4.6 5.1  ?CL 94* 95*  ?CO2 21* 21*  ?GLUCOSE 119* 101*  ?BUN 147* 167*  ?CREATININE 10.35* 12.91*  ?CALCIUM 8.8* 9.2  ?PHOS  --  6.1*  ? ?Liver Function Tests: ?Recent Labs  ?Lab 10/23/21 ?2951  ?ALBUMIN 2.6*  ? ?No results for input(s): LIPASE, AMYLASE in the last 168 hours. ?CBC: ?Recent Labs  ?Lab 10/22/21 ?0605 10/22/21 ?1632 10/23/21 ?8841  ?WBC 12.2* 10.8* 11.0*  ?NEUTROABS 8.6*  --   --   ?HGB 4.1* 5.4* 7.2*  ?HCT 14.0* 16.0* 21.3*  ?MCV 102.9* 91.4 89.1  ?PLT 156 129* 124*  ? ?Blood Culture ?   ?Component Value Date/Time  ? SDES BLOOD RIGHT ARM 10/29/2020 1112  ? SPECREQUEST  10/29/2020 1112  ?  BOTTLES DRAWN AEROBIC AND ANAEROBIC Blood Culture results may not be optimal due to an inadequate volume of blood received in culture bottles  ? CULT  10/29/2020 1112  ?  NO GROWTH 5 DAYS ?Performed at [facility], [place] [address], [place], [place] [address] ?  ? REPTSTATUS 11/03/2020 FINAL 10/29/2020 1112  ? ? ?Cardiac Enzymes: ?No results for input(s): CKTOTAL, CKMB, CKMBINDEX, TROPONINI in the last 168 hours. ?CBG: ?No results for input(s): GLUCAP in the last 168 hours. ?Iron Studies: No results for input(s): IRON, TIBC,  TRANSFERRIN, FERRITIN in the last 72 hours. ?'@lablastinr3'$ @ ?Studies/Results: ?DG Chest 2 View ? ?Result Date: 10/22/2021 ?CLINICAL DATA:  56 year old female with history of chest pain. EXAM: CHEST - 2 VIEW COMPARISON:  Chest x-ray 11/02/2020. FINDINGS: Linear scarring or atelectasis in the left mid lung. Lung volumes are normal. No consolidative airspace disease. No pleural effusions. No pneumothorax. No pulmonary nodule or mass noted. Pulmonary vasculature and the cardiomediastinal silhouette are within normal limits. Atherosclerosis in the thoracic aorta. IMPRESSION: 1. No radiographic evidence of acute cardiopulmonary disease. 2. Linear scarring or subsegmental atelectasis in the left mid lung. 3. Aortic atherosclerosis. Electronically Signed   By: Vinnie Langton M.D.   On: 10/22/2021 06:18   ?Medications: ? ? Chlorhexidine Gluconate Cloth  6 each Topical Q0600  ? ondansetron  4 mg Oral Once  ? pantoprazole (PROTONIX) IV  40 mg Intravenous Q12H  ? ? ? ?OP HD: [facility] ? 4h 400/500  71.5kg   2/2 bath  AVF  LUA   Hep none ? - hectorol 5 ug tiw ? - venofer '100mg'$  IV q HD, 3 of 10 left ?  ?  ?Assessment/ Plan: ?Symptomatic anemia - Hb 4.1 in ED. 3 days of dark stools, +abd symptoms, no anticoagulants. FOB here is +. H/O ASA use for  back pain. Now S/P 4 units PRBCS. HGB this AM 7.2. Has been seen by GI. Endoscopy later today.  ?Chest pain - w/u neg,  per ED/ pmd.  ?ESRD: MWF HD. HD today on schedule. SCr 12.9 BUN 167.  High BUN most likelydue to upper GIB. Follow labs.  ?Volume/ hypertension: looks volume ^ on exam, but no resp issues. BP high at start of HD, fell precipitously. UFG lowered.  ?Anemia of ESRD: As noted above. She was on ESA as OP.  Will cont outpt IV fe load for 2-3 more doses.  ?MBD CKD: CCa in range, will add on phos. Cont vdra w/ HD.  ?Nutrition: Album low. NPO at present. Add protein supps when eating.  ?  ? ?Dylin Ihnen H. Trella Thurmond NP-C ?10/23/2021, 10:02 AM  ?[facility] ?[phone] ? ? ?  ? ?

## 2021-10-23 NOTE — Progress Notes (Signed)
2020 Pt. Vomited 150 mL. RN made on call family med HCP aware.  ?

## 2021-10-23 NOTE — Anesthesia Preprocedure Evaluation (Addendum)
Anesthesia Evaluation  ?Patient identified by MRN, date of birth, ID band ?Patient awake and Patient confused ? ? ? ?Reviewed: ?Allergy & Precautions, NPO status , Patient's Chart, lab work & pertinent test results, reviewed documented beta blocker date and time  ? ?Airway ?Mallampati: II ? ? ? ? ? ? Dental ? ?(+) Poor Dentition, Missing ?  ?Pulmonary ?Current Smoker,  ?  ?Pulmonary exam normal ? ? ? ? ? ? ? Cardiovascular ?hypertension, Pt. on medications and Pt. on home beta blockers ? ?Rhythm:Regular Rate:Tachycardia ? ? ?  ?Neuro/Psych ?  ? GI/Hepatic ?Neg liver ROS, GERD  ,  ?Endo/Other  ? ? Renal/GU ?ESRF and DialysisRenal disease  ?negative genitourinary ?  ?Musculoskeletal ? ? Abdominal ?Normal abdominal exam  (+)   ?Peds ? Hematology ? ?(+) Blood dyscrasia, anemia ,   ?Anesthesia Other Findings ? ? Reproductive/Obstetrics ? ?  ? ? ? ? ? ? ? ? ? ? ? ? ? ?  ?  ? ? ? ? ? ? ?Anesthesia Physical ?Anesthesia Plan ? ?ASA: 3 ? ?Anesthesia Plan: General  ? ?Post-op Pain Management: Minimal or no pain anticipated  ? ?Induction: Intravenous, Rapid sequence and Cricoid pressure planned ? ?PONV Risk Score and Plan: 2 ? ?Airway Management Planned: Oral ETT ? ?Additional Equipment: None ? ?Intra-op Plan:  ? ?Post-operative Plan: Extubation in OR ? ?Informed Consent: I have reviewed the patients History and Physical, chart, labs and discussed the procedure including the risks, benefits and alternatives for the proposed anesthesia with the patient or authorized representative who has indicated his/her understanding and acceptance.  ? ? ? ?Dental advisory given ? ?Plan Discussed with: CRNA ? ?Anesthesia Plan Comments:   ? ? ? ? ? ? ?Anesthesia Quick Evaluation ? ?

## 2021-10-24 LAB — RENAL FUNCTION PANEL
Albumin: 2.8 g/dL — ABNORMAL LOW (ref 3.5–5.0)
Anion gap: 13 (ref 5–15)
BUN: 73 mg/dL — ABNORMAL HIGH (ref 6–20)
CO2: 25 mmol/L (ref 22–32)
Calcium: 8.2 mg/dL — ABNORMAL LOW (ref 8.9–10.3)
Chloride: 99 mmol/L (ref 98–111)
Creatinine, Ser: 7.89 mg/dL — ABNORMAL HIGH (ref 0.44–1.00)
GFR, Estimated: 6 mL/min — ABNORMAL LOW (ref >60)
Glucose, Bld: 135 mg/dL — ABNORMAL HIGH (ref 70–99)
Phosphorus: 6.7 mg/dL — ABNORMAL HIGH (ref 2.5–4.6)
Potassium: 5.4 mmol/L — ABNORMAL HIGH (ref 3.5–5.1)
Sodium: 137 mmol/L (ref 135–145)

## 2021-10-24 LAB — CBC
HCT: 23.1 % — ABNORMAL LOW (ref 36.0–46.0)
Hemoglobin: 7.9 g/dL — ABNORMAL LOW (ref 12.0–15.0)
MCH: 30.6 pg (ref 26.0–34.0)
MCHC: 34.2 g/dL (ref 30.0–36.0)
MCV: 89.5 fL (ref 80.0–100.0)
Platelets: 119 10*3/uL — ABNORMAL LOW (ref 150–400)
RBC: 2.58 MIL/uL — ABNORMAL LOW (ref 3.87–5.11)
RDW: 19.2 % — ABNORMAL HIGH (ref 11.5–15.5)
WBC: 11.1 10*3/uL — ABNORMAL HIGH (ref 4.0–10.5)
nRBC: 0.3 % — ABNORMAL HIGH (ref 0.0–0.2)

## 2021-10-24 LAB — SURGICAL PATHOLOGY

## 2021-10-24 MED ORDER — SODIUM ZIRCONIUM CYCLOSILICATE 10 G PO PACK
10.0000 g | PACK | Freq: Once | ORAL | Status: AC
  Administered 2021-10-24: 10 g via ORAL
  Filled 2021-10-24: qty 1

## 2021-10-24 MED ORDER — ONDANSETRON 4 MG PO TBDP: 4.0000 mg | ORAL_TABLET | Freq: Three times a day (TID) | ORAL | 0 refills | Status: DC | PRN

## 2021-10-24 MED ORDER — PANTOPRAZOLE SODIUM 40 MG PO TBEC: 40.0000 mg | DELAYED_RELEASE_TABLET | Freq: Two times a day (BID) | ORAL | 1 refills | Status: AC

## 2021-10-24 NOTE — Hospital Course (Signed)
Feels better this morning.  ? ?RN is supposed to be bringing up a sandwich. She had some crackers that were tolerated but could not tolerate peanut butter.  ? ?Tried to eat a sandwich from [place], but was maybe too greasy and could not eat but a couple of bites. ? ?Discussed EGD findings. ?

## 2021-10-24 NOTE — Discharge Instructions (Signed)
Dear Ruth Gutierrez, ?I am so glad you are feeling better and can be discharged today (10/24/2021)! You were admitted for your blood count (hemoglobin) dropping due to an ulcer in your stomach.  ? ?Please see the following instructions: ?Please see your primary care / family doctor for your medical issues, concerns and or health care needs. ?Follow-up with GI in 12 weeks for repeat upper GI endoscopy. ?NEW meds:  ?Protonix 40 mg twice daily for 8 weeks, then once daily. ?CONTINUE home meds: ?Amlodipine 10 mg, carvedilol 12.5 mg twice daily, zoloft 50 mg daily. ?STOP meds:  ?Aspirin 81 mg, clonidine 0.1 mg ? ?Report any adverse effects and or reactions from the medicines to your outpatient provider promptly. Do not engage in alcohol and or illegal drug use while on prescription medicines. In the event of worsening symptoms, call 911 and/or go to the nearest ED for appropriate evaluation and treatment of symptoms. ? ?It was a pleasure meeting you, Ruth Gutierrez.  I wish you and your family the best, and hope you stay happy and healthy! ? ?Rosezetta Schlatter, MD ?10/24/2021   ?

## 2021-10-24 NOTE — Discharge Summary (Signed)
? ?Name: Ruth Gutierrez [id number] ?MRN: [id number] ?DOB: Aug 31, 1965 56 y.o. ?PCP: Gaylan Gerold, DO ? ?Date of Admission: 10/22/2021  5:38 AM ?Date of Discharge: 10/24/2021  10/24/21 2:00 PM ?Attending Physician: No att. providers found ? ?Discharge Diagnosis: ?Principal Problem: ?  Symptomatic anemia ?Active Problems: ?  ESRD on hemodialysis ([facility]) ?  Depression ?  Rectal bleed ? ?Discharge Medications: ?Allergies as of 10/24/2021   ? ?   Reactions  ? Penicillins Itching, Rash  ? Did it involve swelling of the face/tongue/throat, SOB, or low BP?Y ?Did it involve sudden or severe rash/hives, skin peeling, or any reaction on the inside of your mouth or nose? Y ?Did you need to seek medical attention at a hospital or doctor's office? Y ?When did it last happen?  2016     ?If all above answers are "NO", may proceed with cephalosporin use.  ? ?  ? ?  ?Medication List  ?  ? ?STOP taking these medications   ? ?aspirin 81 MG EC tablet ?  ?cloNIDine 0.1 MG tablet ?Commonly known as: Catapres ?  ?levETIRAcetam 250 MG tablet ?Commonly known as: KEPPRA ?  ?polyethylene glycol 17 g packet ?Commonly known as: MIRALAX / GLYCOLAX ?  ?VITAMIN D3 PO ?  ? ?  ? ?TAKE these medications   ? ?albuterol 108 (90 Base) MCG/ACT inhaler ?Commonly known as: VENTOLIN HFA ?Inhale 3 puffs into the lungs daily as needed for wheezing or shortness of breath. ?  ?amLODipine 10 MG tablet ?Commonly known as: NORVASC ?Take 1 tablet (10 mg total) by mouth daily. ?  ?BLINK TEARS OP ?Place 1 drop into both eyes daily as needed (tired eyes). ?  ?carvedilol 12.5 MG tablet ?Commonly known as: COREG ?Take 1 tablet (12.5 mg total) by mouth 2 (two) times daily with a meal. ?  ?diphenhydrAMINE 25 mg capsule ?Commonly known as: BENADRYL ?Take 25 mg by mouth at bedtime as needed for sleep. ?  ?lidocaine-prilocaine cream ?Commonly known as: EMLA ?Apply 1 application. topically every Monday, Wednesday, and Friday with hemodialysis. ?  ?ondansetron 4 MG disintegrating  tablet ?Commonly known as: ZOFRAN-ODT ?Take 1 tablet (4 mg total) by mouth every 8 (eight) hours as needed for nausea or vomiting. ?  ?pantoprazole 40 MG tablet ?Commonly known as: PROTONIX ?Take 1 tablet (40 mg total) by mouth 2 (two) times daily before a meal. ?  ?sertraline 50 MG tablet ?Commonly known as: Zoloft ?Take 1 tablet (50 mg total) by mouth daily. ?  ?sevelamer carbonate 800 MG tablet ?Commonly known as: RENVELA ?Take 2,400 mg by mouth 3 (three) times daily with meals. ?  ? ?  ? ? ?Disposition and follow-up:   ?Ruth Gutierrez was discharged from [facility] in Stable condition.  At the hospital follow up visit please address: ? ?1.  Gastric ulcer with erosive gastritis, anemia, MDD, HTN ? ?2.  Labs / imaging needed at time of follow-up: CBC to monitor Hgb; repeat EGD in 12 weeks. ? ?3.  Pending labs/ test needing follow-up: N/A ? ?Follow-up Appointments: ? ? ?Hospital Course by problem list: ?Symptomatic anemia 2/2 UGIB ?Gastric ulcer with surrounding erosive gastritis ?Noted dark, tarry stools for several days and hemoglobin found to be 4.1 on admission. Has increased to 8.3 s/p 4u pRBCs. 7.9 on day of discharge. No additional GI bleeding since admission. Underwent EGD, which revealed deep non-bleeding gastric ulcer with surrounding erosive gastritis. Pathology negative for H. pylori. On day of discharge, patient tolerated graham crackers, broth,  and a sandwich without emesis. To continue protonix '40mg'$  PO BID x 8 weeks (EOT 5/18) then decrease to once daily indefinitely. Will need repeat EGD in 3 months to ensure healing per GI. Patient advised to avoid all NSAIDs, particularly her ASA, going forward, discussed this with patient's daughter as well. ?  ?ESRD, on HD MWF ?Dialyzed on 3/22. Patient to resume regularly scheduled HD tomorrow.  ?  ?MDD ?Home zoloft continued throughout admission. ?  ?HTN ?BP was initially normotensive then elevated with intermittent tachycardia, so  home norvasc and coreg were restarted.  ? ?Discharge Subjective: ?Feels better this morning.  ? ?RN is supposed to be bringing up a sandwich. She had some crackers that were tolerated but could not tolerate peanut butter.  ? ?Tried to eat a sandwich from [place], but was maybe too greasy and could not eat but a couple of bites. ? ?Discussed EGD findings. ? ?Discharge Exam:   ?BP 136/64 (BP Location: Right Arm)   Pulse 98   Temp 98.6 ?F (37 ?C) (Oral)   Resp 15   Ht '5\' 7"'$  (1.702 m)   Wt 76.7 kg   LMP 06/02/2015   SpO2 97%   BMI 26.48 kg/m?  ?Discharge exam:  ?General: chronically ill-appearing woman in good spirits ?HENT: normocephalic, atraumatic, external nares and ears appear normal ?EYES: conjunctiva non-erythematous, no scleral icterus ?CV: regular rate, normal rhythm, no murmurs, rubs, gallops. Non-edematous LE. ?Pulmonary: normal work of breathing on RA, lungs clear to auscultation bilaterally ?Abdominal: non-distended, soft, non-tender to palpation, normal BS all 4 quadrants ?Skin: Warm and dry; normal skin turgor ?Neurological: Awake, alert and oriented x3. ?Psych: upbeat affect and normal behavior  ?  ? ?Pertinent Labs, Studies, and Procedures:  ? ?DG Chest 2 View ? ?Result Date: 10/22/2021 ?CLINICAL DATA:  56 year old female with history of chest pain. EXAM: CHEST - 2 VIEW COMPARISON:  Chest x-ray 11/02/2020. FINDINGS: Linear scarring or atelectasis in the left mid lung. Lung volumes are normal. No consolidative airspace disease. No pleural effusions. No pneumothorax. No pulmonary nodule or mass noted. Pulmonary vasculature and the cardiomediastinal silhouette are within normal limits. Atherosclerosis in the thoracic aorta. IMPRESSION: 1. No radiographic evidence of acute cardiopulmonary disease. 2. Linear scarring or subsegmental atelectasis in the left mid lung. 3. Aortic atherosclerosis. Electronically Signed   By: Vinnie Langton M.D.   On: 10/22/2021 06:18  ?  ? ?  Latest Ref Rng & Units  10/24/2021  ?  4:20 AM 10/23/2021  ?  4:13 PM 10/23/2021  ?  6:50 AM  ?CBC  ?WBC 4.0 - 10.5 K/uL 11.1   11.2   11.0    ?Hemoglobin 12.0 - 15.0 g/dL 7.9   8.3   7.2    ?Hematocrit 36.0 - 46.0 % 23.1   23.8   21.3    ?Platelets 150 - 400 K/uL 119   114   124    ?  ? ?  Latest Ref Rng & Units 10/24/2021  ?  4:20 AM 10/23/2021  ?  6:50 AM 10/22/2021  ?  6:05 AM  ?BMP  ?Glucose 70 - 99 mg/dL 135   101   119    ?BUN 6 - 20 mg/dL 73   167   147    ?Creatinine 0.44 - 1.00 mg/dL 7.89   12.91   10.35    ?Sodium 135 - 145 mmol/L 137   134   137    ?Potassium 3.5 - 5.1 mmol/L 5.4   5.1  4.6    ?Chloride 98 - 111 mmol/L 99   95   94    ?CO2 22 - 32 mmol/L '25   21   21    '$ ?Calcium 8.9 - 10.3 mg/dL 8.2   9.2   8.8    ?  ?Discharge Instructions: ?Follow-up with GI with repeat endoscopy in 12 weeks. Continue medications as listed above. ?Follow-up with PCP ?Continue HD with MWF schedule. ? ?Signed: ?Rosezetta Schlatter, MD ?10/24/2021, 4:22 PM   ?Pager: [phone]  ?

## 2021-10-24 NOTE — Progress Notes (Signed)
Pt discharged home. Pt's daughter at bedside. PIV removed and all room belongings packed. Pt educated on all discharge information and has no questions or concerns at this time. ? ?Justice Rocher, RN ? ?

## 2021-10-24 NOTE — Progress Notes (Signed)
?[facility] ?Progress Note  ? ?Subjective: Seen in room, wants to go home. Says "she is full of energy now" and wants to go to HD at her center tomorrow. No C/Os.   ? ?Objective ?Vitals:  ? 10/23/21 2308 10/24/21 0328 10/24/21 0735 10/24/21 1209  ?BP: (!) 160/82 (!) 159/83 126/77 136/64  ?Pulse: 97 99 96 98  ?Resp: '12 14 14 15  '$ ?Temp: 98.6 ?F (37 ?C) 98.8 ?F (37.1 ?C) 98.8 ?F (37.1 ?C) 98.6 ?F (37 ?C)  ?TempSrc: Oral Oral Oral Oral  ?SpO2: 99% 98% 95% 97%  ?Weight:      ?Height:      ? ?Physical Exam ?General: Pleasant WN,WD female in NAD ?HEENT: periorbital edema resolved ?Heart: S1,S2 RRR No M/R/G ?Lungs: CTAB Anteriorly ?Abdomen: NABS, NT ?Extremities: No LE edema ?Dialysis Access: L AVF aneurysmal +T/B ? ? ?Additional Objective ?Labs: ?Basic Metabolic Panel: ?Recent Labs  ?Lab 10/22/21 ?3212 10/23/21 ?0650 10/24/21 ?0420  ?NA 137 134* 137  ?K 4.6 5.1 5.4*  ?CL 94* 95* 99  ?CO2 21* 21* 25  ?GLUCOSE 119* 101* 135*  ?BUN 147* 167* 73*  ?CREATININE 10.35* 12.91* 7.89*  ?CALCIUM 8.8* 9.2 8.2*  ?PHOS  --  6.1* 6.7*  ? ?Liver Function Tests: ?Recent Labs  ?Lab 10/23/21 ?2482 10/24/21 ?0420  ?ALBUMIN 2.6* 2.8*  ? ?No results for input(s): LIPASE, AMYLASE in the last 168 hours. ?CBC: ?Recent Labs  ?Lab 10/22/21 ?5003 10/22/21 ?1632 10/23/21 ?7048 10/23/21 ?1613 10/24/21 ?0420  ?WBC 12.2* 10.8* 11.0* 11.2* 11.1*  ?NEUTROABS 8.6*  --   --   --   --   ?HGB 4.1* 5.4* 7.2* 8.3* 7.9*  ?HCT 14.0* 16.0* 21.3* 23.8* 23.1*  ?MCV 102.9* 91.4 89.1 89.1 89.5  ?PLT 156 129* 124* 114* 119*  ? ?Blood Culture ?   ?Component Value Date/Time  ? SDES BLOOD RIGHT ARM 10/29/2020 1112  ? SPECREQUEST  10/29/2020 1112  ?  BOTTLES DRAWN AEROBIC AND ANAEROBIC Blood Culture results may not be optimal due to an inadequate volume of blood received in culture bottles  ? CULT  10/29/2020 1112  ?  NO GROWTH 5 DAYS ?Performed at [facility], [place] [address], [place], [place] [address] ?  ? REPTSTATUS 11/03/2020 FINAL 10/29/2020  1112  ? ? ?Cardiac Enzymes: ?No results for input(s): CKTOTAL, CKMB, CKMBINDEX, TROPONINI in the last 168 hours. ?CBG: ?No results for input(s): GLUCAP in the last 168 hours. ?Iron Studies: No results for input(s): IRON, TIBC, TRANSFERRIN, FERRITIN in the last 72 hours. ?'@lablastinr3'$ @ ?Studies/Results: ?No results found. ?Medications: ? ? amLODipine  10 mg Oral Daily  ? carvedilol  12.5 mg Oral BID WC  ? Chlorhexidine Gluconate Cloth  6 each Topical Q0600  ? ondansetron  4 mg Oral Q8H  ? pantoprazole  40 mg Oral BID AC  ? sertraline  50 mg Oral Daily  ? sevelamer carbonate  2,400 mg Oral TID WC  ? ? ? ?OP HD: MWF [facility] ? 4h 400/500  71.5kg   2/2 bath  AVF  LUA   Hep none ? - hectorol 5 ug tiw ? - venofer '100mg'$  IV q HD, 3 of 10 left ?  ?  ?Assessment/ Plan: ?Symptomatic anemia - Hb 4.1 in ED on admission. 3 days of dark stools, +abd symptoms, no anticoagulants. FOB here is +. H/O ASA use for back pain. Now S/P 4 units PRBCS. HGB this AM 7.2. Has been seen by GI. Endoscopy 10/23/2021. Has gastric ulcer with surrounding  erosive gastritis. HGB 7.9 today. Per primary/GI.  ?Chest pain - w/u neg,  per ED/ pmd.  ?ESRD: MWF HD. HD 10/25/2021 hopefully at OP center.  SCr 12.9 BUN 167 on admission. BUN down to 73 10/24/2021  High BUN most likelydue to upper GIB. Follow labs.  ?Volume/ hypertension: Much improved after HD yesterday. Net UF 2676. No post wt done! Leave EDW low on discharge.  ?Anemia of ESRD: As noted above. She was on ESA as OP.  Will cont outpt IV fe load for 2-3 more doses.  ?MBD CKD: CCa in range, PO4 elevated. Cont vdra, binders.  ?Nutrition: Album low. Protein supps at OP center.  ?  ? ?Oshua Mcconaha H. Adalynn Corne NP-C ?10/24/2021, 12:47 PM  ?[facility] ?[phone] ? ? ?  ? ?

## 2021-10-24 NOTE — Progress Notes (Signed)
Pt receives out-pt HD at [facility] on MWF (6:20 chair time). Per provider, pt will d/c to home today. Contacted clinic and spoke to [facility]. Clinic aware pt will resume care tomorrow. ? ?Melven Sartorius ?Renal Navigator ?[phone] ?

## 2021-10-25 ENCOUNTER — Telehealth (HOSPITAL_COMMUNITY): Payer: Self-pay | Admitting: Nephrology

## 2021-10-25 DIAGNOSIS — N186 End stage renal disease: Secondary | ICD-10-CM | POA: Diagnosis not present

## 2021-10-25 DIAGNOSIS — E1129 Type 2 diabetes mellitus with other diabetic kidney complication: Secondary | ICD-10-CM | POA: Diagnosis not present

## 2021-10-25 DIAGNOSIS — L299 Pruritus, unspecified: Secondary | ICD-10-CM | POA: Diagnosis not present

## 2021-10-25 DIAGNOSIS — Z992 Dependence on renal dialysis: Secondary | ICD-10-CM | POA: Diagnosis not present

## 2021-10-25 DIAGNOSIS — N2581 Secondary hyperparathyroidism of renal origin: Secondary | ICD-10-CM | POA: Diagnosis not present

## 2021-10-25 NOTE — Telephone Encounter (Signed)
Transition of care contact from inpatient facility ? ?Date of Discharge: 10/24/21 ?Date of Contact:10/25/21 - attempted ?Method of contact: Phone ? ?Attempted to contact patient to discuss transition of care from inpatient admission. Patient did not answer the phone. Voicemail picked up, "mailbox full" and unable to leave a message. Will attempt to catch her at dialysis next week. ? ?Veneta Penton, PA-C ?[facility] ?Pager [phone] ? ? ?

## 2021-10-27 ENCOUNTER — Encounter (HOSPITAL_COMMUNITY): Payer: Self-pay | Admitting: Gastroenterology

## 2021-10-28 DIAGNOSIS — N186 End stage renal disease: Secondary | ICD-10-CM | POA: Diagnosis not present

## 2021-10-28 DIAGNOSIS — E1129 Type 2 diabetes mellitus with other diabetic kidney complication: Secondary | ICD-10-CM | POA: Diagnosis not present

## 2021-10-28 DIAGNOSIS — Z992 Dependence on renal dialysis: Secondary | ICD-10-CM | POA: Diagnosis not present

## 2021-10-28 DIAGNOSIS — L299 Pruritus, unspecified: Secondary | ICD-10-CM | POA: Diagnosis not present

## 2021-10-28 DIAGNOSIS — N2581 Secondary hyperparathyroidism of renal origin: Secondary | ICD-10-CM | POA: Diagnosis not present

## 2021-10-30 DIAGNOSIS — Z992 Dependence on renal dialysis: Secondary | ICD-10-CM | POA: Diagnosis not present

## 2021-10-30 DIAGNOSIS — L299 Pruritus, unspecified: Secondary | ICD-10-CM | POA: Diagnosis not present

## 2021-10-30 DIAGNOSIS — E1129 Type 2 diabetes mellitus with other diabetic kidney complication: Secondary | ICD-10-CM | POA: Diagnosis not present

## 2021-10-30 DIAGNOSIS — N186 End stage renal disease: Secondary | ICD-10-CM | POA: Diagnosis not present

## 2021-10-30 DIAGNOSIS — N2581 Secondary hyperparathyroidism of renal origin: Secondary | ICD-10-CM | POA: Diagnosis not present

## 2021-10-31 ENCOUNTER — Encounter: Payer: Self-pay | Admitting: Gastroenterology

## 2021-11-01 DIAGNOSIS — N186 End stage renal disease: Secondary | ICD-10-CM | POA: Diagnosis not present

## 2021-11-01 DIAGNOSIS — I129 Hypertensive chronic kidney disease with stage 1 through stage 4 chronic kidney disease, or unspecified chronic kidney disease: Secondary | ICD-10-CM | POA: Diagnosis not present

## 2021-11-01 DIAGNOSIS — Z992 Dependence on renal dialysis: Secondary | ICD-10-CM | POA: Diagnosis not present

## 2021-11-01 DIAGNOSIS — N2581 Secondary hyperparathyroidism of renal origin: Secondary | ICD-10-CM | POA: Diagnosis not present

## 2021-11-04 DIAGNOSIS — N2581 Secondary hyperparathyroidism of renal origin: Secondary | ICD-10-CM | POA: Diagnosis not present

## 2021-11-04 DIAGNOSIS — N186 End stage renal disease: Secondary | ICD-10-CM | POA: Diagnosis not present

## 2021-11-04 DIAGNOSIS — R52 Pain, unspecified: Secondary | ICD-10-CM | POA: Diagnosis not present

## 2021-11-04 DIAGNOSIS — E1129 Type 2 diabetes mellitus with other diabetic kidney complication: Secondary | ICD-10-CM | POA: Diagnosis not present

## 2021-11-04 DIAGNOSIS — Z992 Dependence on renal dialysis: Secondary | ICD-10-CM | POA: Diagnosis not present

## 2021-11-05 DIAGNOSIS — E1129 Type 2 diabetes mellitus with other diabetic kidney complication: Secondary | ICD-10-CM | POA: Diagnosis not present

## 2021-11-05 DIAGNOSIS — N186 End stage renal disease: Secondary | ICD-10-CM | POA: Diagnosis not present

## 2021-11-05 DIAGNOSIS — Z992 Dependence on renal dialysis: Secondary | ICD-10-CM | POA: Diagnosis not present

## 2021-11-05 DIAGNOSIS — R52 Pain, unspecified: Secondary | ICD-10-CM | POA: Diagnosis not present

## 2021-11-05 DIAGNOSIS — N2581 Secondary hyperparathyroidism of renal origin: Secondary | ICD-10-CM | POA: Diagnosis not present

## 2021-11-06 DIAGNOSIS — E1129 Type 2 diabetes mellitus with other diabetic kidney complication: Secondary | ICD-10-CM | POA: Diagnosis not present

## 2021-11-06 DIAGNOSIS — R52 Pain, unspecified: Secondary | ICD-10-CM | POA: Diagnosis not present

## 2021-11-06 DIAGNOSIS — N2581 Secondary hyperparathyroidism of renal origin: Secondary | ICD-10-CM | POA: Diagnosis not present

## 2021-11-06 DIAGNOSIS — N186 End stage renal disease: Secondary | ICD-10-CM | POA: Diagnosis not present

## 2021-11-06 DIAGNOSIS — Z992 Dependence on renal dialysis: Secondary | ICD-10-CM | POA: Diagnosis not present

## 2021-11-07 ENCOUNTER — Ambulatory Visit (INDEPENDENT_AMBULATORY_CARE_PROVIDER_SITE_OTHER): Payer: Medicare Other | Admitting: Student

## 2021-11-07 ENCOUNTER — Encounter: Payer: Self-pay | Admitting: Student

## 2021-11-07 ENCOUNTER — Other Ambulatory Visit: Payer: Self-pay

## 2021-11-07 VITALS — BP 165/80 | HR 91 | Temp 99.2°F | Ht 67.0 in | Wt 157.1 lb

## 2021-11-07 DIAGNOSIS — Z720 Tobacco use: Secondary | ICD-10-CM | POA: Diagnosis not present

## 2021-11-07 DIAGNOSIS — D509 Iron deficiency anemia, unspecified: Secondary | ICD-10-CM

## 2021-11-07 DIAGNOSIS — I10 Essential (primary) hypertension: Secondary | ICD-10-CM | POA: Diagnosis not present

## 2021-11-07 DIAGNOSIS — M25511 Pain in right shoulder: Secondary | ICD-10-CM | POA: Diagnosis not present

## 2021-11-07 DIAGNOSIS — D649 Anemia, unspecified: Secondary | ICD-10-CM

## 2021-11-07 MED ORDER — CARVEDILOL 25 MG PO TABS: 25.0000 mg | ORAL_TABLET | Freq: Two times a day (BID) | ORAL | 3 refills | Status: DC

## 2021-11-07 MED ORDER — DICLOFENAC SODIUM 1 % EX GEL: 2.0000 g | Freq: Two times a day (BID) | CUTANEOUS | 0 refills | Status: AC | PRN

## 2021-11-07 NOTE — Progress Notes (Signed)
? ?  CC: Hospital follow-up ? ?HPI: ? ?[name] is a 56 y.o. with past medical history of hypertension, ESRD on HD who presents to clinic today for her recent hospitalization in March.  Also report acute right shoulder pain after this hospitalization. ? ?Please see problem based charting for detail ? ?Past Medical History:  ?Diagnosis Date  ? Anemia of chronic disease   ? Arthritis   ? Deceased-donor kidney transplant   ? Performed at [facility], April 2010.  Initial ESRD due to HTN nephropathy  ? Eczema   ? ESRD (end stage renal disease) ([place])   ? M/W/F dialysis  ? FUO (fever of unknown origin) 05/17/2015  ? GERD (gastroesophageal reflux disease)   ? Headache(784.0)   ? History of hyperparathyroidism   ? Hypertension   ? Peritonitis ([place]) 10/2019  ? Shortness of breath   ? Wears glasses   ? ?Review of Systems:  Per HPI ? ?Physical Exam: ? ?Vitals:  ? 11/07/21 1113  ?BP: (!) 165/80  ?Pulse: 91  ?Temp: 99.2 ?F (37.3 ?C)  ?TempSrc: Oral  ?SpO2: 100%  ?Weight: 157 lb 1.6 oz (71.3 kg)  ?Height: '5\' 7"'$  (1.702 m)  ? ?Physical Exam ?Constitutional:   ?   General: She is not in acute distress. ?   Appearance: She is not ill-appearing.  ?HENT:  ?   Head: Normocephalic.  ?Eyes:  ?   General:     ?   Right eye: No discharge.     ?   Left eye: No discharge.  ?   Conjunctiva/sclera: Conjunctivae normal.  ?Cardiovascular:  ?   Rate and Rhythm: Normal rate and regular rhythm.  ?   Heart sounds: Normal heart sounds.  ?Pulmonary:  ?   Effort: Pulmonary effort is normal.  ?   Breath sounds: Normal breath sounds.  ?Musculoskeletal:  ?   Comments: Left AVF thrill palpated. ?Right shoulder normal appearance.  No joint effusion, erythema or rash.  Limited range of motion due to pain.  ?Skin: ?   General: Skin is warm.  ?Neurological:  ?   General: No focal deficit present.  ?   Mental Status: She is alert and oriented to person, place, and time.  ?Psychiatric:     ?   Mood and Affect: Mood normal.  ?  ? ?Assessment & Plan:   ? ?See Encounters Tab for problem based charting. ? ?Patient discussed with Dr. Philipp Ovens  ?

## 2021-11-07 NOTE — Assessment & Plan Note (Signed)
Blood pressure elevated today 165/80.  Patient currently taking amlodipine 10 mg and Coreg 12.5 mg twice daily.  Her last HD was yesterday. ? ?-Will increase Coreg to 25 mg twice daily.  Her heart rate allows room for dose increase. ?-Continue amlodipine 10 mg ?

## 2021-11-07 NOTE — Assessment & Plan Note (Addendum)
Patient was hospitalized in March for acute blood loss anemia secondary to upper GI bleed.  Patient does report excessive NSAIDs use.  Hemoglobin on admission was 4.1.  She received 4 units of blood and hemoglobin on discharge was 7.9. ? ?EGD showed gastric ulcer and gastritis secondary to NSAID use.  She was started on PPI for 8 weeks and recommended repeat EGD in 3 months. ? ?Patient report adherence to PPI and has stopped all NSAIDs products. ? ?-Repeat CBC.  Also obtain iron study ?-Continue PPI ?-Referred to GI for repeat EGD in 3 months ? ?Addendum ?Stable hemoglobin at 1.9. ?Iron studies showed adequate iron storage. ?She should receive EPO injection with HD ?

## 2021-11-07 NOTE — Addendum Note (Signed)
Addended byGaylan Gerold on: 11/07/2021 12:49 PM ? ? Modules accepted: Orders ? ?

## 2021-11-07 NOTE — Patient Instructions (Addendum)
Ruth Gutierrez, ? ?It was a pleasure seeing you in the clinic today.  Here is a summary what we talked about: ? ?1.  Upper GI bleed: Please continue Protonix as prescribed.  Please stop all NSAIDs products.  I will recheck your blood counts and iron today.  We will supplement the iron if is low.  I will make sure you have an appointment GI in 3 months. ? ?2.  Right shoulder pain: Please take Tylenol as needed for pain.  I also prescribed diclofenac gel for you.  Please rest the shoulder and use ice as needed.  Please let us know if your pain does not get better in the next few weeks. ? ?3.  High blood pressure: Please continue amlodipine.  I will increase Coreg to 25 mg twice daily. ? ?4.  Please follow-up with your hemodialysis schedule. ? ?Please return in 3 months ? ?Take care, ? ?Dr. Alfonse Spruce ?

## 2021-11-07 NOTE — Assessment & Plan Note (Addendum)
Patient report acute right shoulder pain after last hospitalization.  She is unsure if she been laying on that side for a long time while being hospitalized.  Report pain with movement. ? ?Physical exam revealed normal appearance of the right shoulder.  There is no joint effusion erythema or rash.  She does have limited range of motion due to pain. ? ?Per chart review, she had an MRI of the right shoulder in 2022 which showed severe tendinwasosis of the supraspinatus/infraspinatus tendon with probable tearing.  It also shows severe glenohumeral osteoarthritis.  A referral to orthopedic was made.  Patient went to [facility] Nov 2022 but unfortunately was only evaluated for bilateral knee and lower back pain.  Right rotator cuff tear was not addressed. ? ?- Will treat conservatively with Tylenol, diclofenac gel, rest and ice ?- Will place another referral to orthopedics for rotator cuff tear ?- Patient will let us know if pain does not improve in a few weeks ? ? ?

## 2021-11-08 DIAGNOSIS — N186 End stage renal disease: Secondary | ICD-10-CM | POA: Diagnosis not present

## 2021-11-08 DIAGNOSIS — N2581 Secondary hyperparathyroidism of renal origin: Secondary | ICD-10-CM | POA: Diagnosis not present

## 2021-11-08 DIAGNOSIS — R52 Pain, unspecified: Secondary | ICD-10-CM | POA: Diagnosis not present

## 2021-11-08 DIAGNOSIS — E1129 Type 2 diabetes mellitus with other diabetic kidney complication: Secondary | ICD-10-CM | POA: Diagnosis not present

## 2021-11-08 DIAGNOSIS — Z992 Dependence on renal dialysis: Secondary | ICD-10-CM | POA: Diagnosis not present

## 2021-11-08 LAB — IRON AND TIBC
Iron Saturation: 28 % (ref 15–55)
Iron: 53 ug/dL (ref 27–159)
Total Iron Binding Capacity: 189 ug/dL — ABNORMAL LOW (ref 250–450)
UIBC: 136 ug/dL (ref 131–425)

## 2021-11-08 LAB — CBC
Hematocrit: 24.1 % — ABNORMAL LOW (ref 34.0–46.6)
Hemoglobin: 7.9 g/dL — ABNORMAL LOW (ref 11.1–15.9)
MCH: 29.7 pg (ref 26.6–33.0)
MCHC: 32.8 g/dL (ref 31.5–35.7)
MCV: 91 fL (ref 79–97)
Platelets: 166 10*3/uL (ref 150–450)
RBC: 2.66 x10E6/uL — CL (ref 3.77–5.28)
RDW: 17.2 % — ABNORMAL HIGH (ref 11.7–15.4)
WBC: 6.2 10*3/uL (ref 3.4–10.8)

## 2021-11-08 LAB — FERRITIN: Ferritin: 1301 ng/mL — ABNORMAL HIGH (ref 15–150)

## 2021-11-11 DIAGNOSIS — Z992 Dependence on renal dialysis: Secondary | ICD-10-CM | POA: Diagnosis not present

## 2021-11-11 DIAGNOSIS — N2581 Secondary hyperparathyroidism of renal origin: Secondary | ICD-10-CM | POA: Diagnosis not present

## 2021-11-11 DIAGNOSIS — N186 End stage renal disease: Secondary | ICD-10-CM | POA: Diagnosis not present

## 2021-11-11 DIAGNOSIS — E1129 Type 2 diabetes mellitus with other diabetic kidney complication: Secondary | ICD-10-CM | POA: Diagnosis not present

## 2021-11-11 DIAGNOSIS — R52 Pain, unspecified: Secondary | ICD-10-CM | POA: Diagnosis not present

## 2021-11-12 MED ORDER — ALBUTEROL SULFATE HFA 108 (90 BASE) MCG/ACT IN AERS: 3.0000 | INHALATION_SPRAY | Freq: Every day | RESPIRATORY_TRACT | 0 refills | Status: AC | PRN

## 2021-11-12 NOTE — Addendum Note (Signed)
Addended byGaylan Gerold on: 11/12/2021 09:33 AM ? ? Modules accepted: Orders ? ?

## 2021-11-12 NOTE — Progress Notes (Signed)
Internal Medicine Clinic Attending ? ?Case discussed with Dr. Alfonse Spruce  At the time of the visit.  We reviewed the resident?s history and exam and pertinent patient test results.  I agree with the assessment, diagnosis, and plan of care documented in the resident?s note.  ? ?As a correction to the resident's note: Hemoglobin is stable at 7.9. Iron studies looks consistent with anemia of chronic disease likely 2/2 to her known ESRD.  ?

## 2021-11-13 DIAGNOSIS — N186 End stage renal disease: Secondary | ICD-10-CM | POA: Diagnosis not present

## 2021-11-13 DIAGNOSIS — N2581 Secondary hyperparathyroidism of renal origin: Secondary | ICD-10-CM | POA: Diagnosis not present

## 2021-11-13 DIAGNOSIS — E1129 Type 2 diabetes mellitus with other diabetic kidney complication: Secondary | ICD-10-CM | POA: Diagnosis not present

## 2021-11-13 DIAGNOSIS — R52 Pain, unspecified: Secondary | ICD-10-CM | POA: Diagnosis not present

## 2021-11-13 DIAGNOSIS — Z992 Dependence on renal dialysis: Secondary | ICD-10-CM | POA: Diagnosis not present

## 2021-11-15 DIAGNOSIS — N2581 Secondary hyperparathyroidism of renal origin: Secondary | ICD-10-CM | POA: Diagnosis not present

## 2021-11-15 DIAGNOSIS — Z992 Dependence on renal dialysis: Secondary | ICD-10-CM | POA: Diagnosis not present

## 2021-11-15 DIAGNOSIS — R52 Pain, unspecified: Secondary | ICD-10-CM | POA: Diagnosis not present

## 2021-11-15 DIAGNOSIS — E1129 Type 2 diabetes mellitus with other diabetic kidney complication: Secondary | ICD-10-CM | POA: Diagnosis not present

## 2021-11-15 DIAGNOSIS — N186 End stage renal disease: Secondary | ICD-10-CM | POA: Diagnosis not present

## 2021-11-18 DIAGNOSIS — E1129 Type 2 diabetes mellitus with other diabetic kidney complication: Secondary | ICD-10-CM | POA: Diagnosis not present

## 2021-11-18 DIAGNOSIS — N2581 Secondary hyperparathyroidism of renal origin: Secondary | ICD-10-CM | POA: Diagnosis not present

## 2021-11-18 DIAGNOSIS — N186 End stage renal disease: Secondary | ICD-10-CM | POA: Diagnosis not present

## 2021-11-18 DIAGNOSIS — Z992 Dependence on renal dialysis: Secondary | ICD-10-CM | POA: Diagnosis not present

## 2021-11-18 DIAGNOSIS — R52 Pain, unspecified: Secondary | ICD-10-CM | POA: Diagnosis not present

## 2021-11-20 DIAGNOSIS — E1129 Type 2 diabetes mellitus with other diabetic kidney complication: Secondary | ICD-10-CM | POA: Diagnosis not present

## 2021-11-20 DIAGNOSIS — Z992 Dependence on renal dialysis: Secondary | ICD-10-CM | POA: Diagnosis not present

## 2021-11-20 DIAGNOSIS — R52 Pain, unspecified: Secondary | ICD-10-CM | POA: Diagnosis not present

## 2021-11-20 DIAGNOSIS — N2581 Secondary hyperparathyroidism of renal origin: Secondary | ICD-10-CM | POA: Diagnosis not present

## 2021-11-20 DIAGNOSIS — N186 End stage renal disease: Secondary | ICD-10-CM | POA: Diagnosis not present

## 2021-11-22 DIAGNOSIS — N2581 Secondary hyperparathyroidism of renal origin: Secondary | ICD-10-CM | POA: Diagnosis not present

## 2021-11-22 DIAGNOSIS — E1129 Type 2 diabetes mellitus with other diabetic kidney complication: Secondary | ICD-10-CM | POA: Diagnosis not present

## 2021-11-22 DIAGNOSIS — Z992 Dependence on renal dialysis: Secondary | ICD-10-CM | POA: Diagnosis not present

## 2021-11-22 DIAGNOSIS — R52 Pain, unspecified: Secondary | ICD-10-CM | POA: Diagnosis not present

## 2021-11-22 DIAGNOSIS — N186 End stage renal disease: Secondary | ICD-10-CM | POA: Diagnosis not present

## 2021-11-25 DIAGNOSIS — N186 End stage renal disease: Secondary | ICD-10-CM | POA: Diagnosis not present

## 2021-11-25 DIAGNOSIS — N2581 Secondary hyperparathyroidism of renal origin: Secondary | ICD-10-CM | POA: Diagnosis not present

## 2021-11-25 DIAGNOSIS — Z992 Dependence on renal dialysis: Secondary | ICD-10-CM | POA: Diagnosis not present

## 2021-11-25 DIAGNOSIS — R52 Pain, unspecified: Secondary | ICD-10-CM | POA: Diagnosis not present

## 2021-11-25 DIAGNOSIS — E1129 Type 2 diabetes mellitus with other diabetic kidney complication: Secondary | ICD-10-CM | POA: Diagnosis not present

## 2021-11-26 DIAGNOSIS — M19011 Primary osteoarthritis, right shoulder: Secondary | ICD-10-CM | POA: Diagnosis not present

## 2021-11-27 DIAGNOSIS — R52 Pain, unspecified: Secondary | ICD-10-CM | POA: Diagnosis not present

## 2021-11-27 DIAGNOSIS — N2581 Secondary hyperparathyroidism of renal origin: Secondary | ICD-10-CM | POA: Diagnosis not present

## 2021-11-27 DIAGNOSIS — E1129 Type 2 diabetes mellitus with other diabetic kidney complication: Secondary | ICD-10-CM | POA: Diagnosis not present

## 2021-11-27 DIAGNOSIS — N186 End stage renal disease: Secondary | ICD-10-CM | POA: Diagnosis not present

## 2021-11-27 DIAGNOSIS — Z992 Dependence on renal dialysis: Secondary | ICD-10-CM | POA: Diagnosis not present

## 2021-11-29 DIAGNOSIS — E1129 Type 2 diabetes mellitus with other diabetic kidney complication: Secondary | ICD-10-CM | POA: Diagnosis not present

## 2021-11-29 DIAGNOSIS — R52 Pain, unspecified: Secondary | ICD-10-CM | POA: Diagnosis not present

## 2021-11-29 DIAGNOSIS — N2581 Secondary hyperparathyroidism of renal origin: Secondary | ICD-10-CM | POA: Diagnosis not present

## 2021-11-29 DIAGNOSIS — Z992 Dependence on renal dialysis: Secondary | ICD-10-CM | POA: Diagnosis not present

## 2021-11-29 DIAGNOSIS — N186 End stage renal disease: Secondary | ICD-10-CM | POA: Diagnosis not present

## 2021-12-01 DIAGNOSIS — Z992 Dependence on renal dialysis: Secondary | ICD-10-CM | POA: Diagnosis not present

## 2021-12-01 DIAGNOSIS — I129 Hypertensive chronic kidney disease with stage 1 through stage 4 chronic kidney disease, or unspecified chronic kidney disease: Secondary | ICD-10-CM | POA: Diagnosis not present

## 2021-12-01 DIAGNOSIS — N186 End stage renal disease: Secondary | ICD-10-CM | POA: Diagnosis not present

## 2021-12-02 DIAGNOSIS — K31811 Angiodysplasia of stomach and duodenum with bleeding: Secondary | ICD-10-CM | POA: Diagnosis not present

## 2021-12-02 DIAGNOSIS — Z992 Dependence on renal dialysis: Secondary | ICD-10-CM | POA: Diagnosis not present

## 2021-12-02 DIAGNOSIS — N186 End stage renal disease: Secondary | ICD-10-CM | POA: Diagnosis not present

## 2021-12-02 DIAGNOSIS — L299 Pruritus, unspecified: Secondary | ICD-10-CM | POA: Diagnosis not present

## 2021-12-02 DIAGNOSIS — N2581 Secondary hyperparathyroidism of renal origin: Secondary | ICD-10-CM | POA: Diagnosis not present

## 2021-12-04 DIAGNOSIS — N2581 Secondary hyperparathyroidism of renal origin: Secondary | ICD-10-CM | POA: Diagnosis not present

## 2021-12-04 DIAGNOSIS — N186 End stage renal disease: Secondary | ICD-10-CM | POA: Diagnosis not present

## 2021-12-04 DIAGNOSIS — K31811 Angiodysplasia of stomach and duodenum with bleeding: Secondary | ICD-10-CM | POA: Diagnosis not present

## 2021-12-04 DIAGNOSIS — Z992 Dependence on renal dialysis: Secondary | ICD-10-CM | POA: Diagnosis not present

## 2021-12-04 DIAGNOSIS — L299 Pruritus, unspecified: Secondary | ICD-10-CM | POA: Diagnosis not present

## 2021-12-06 DIAGNOSIS — K31811 Angiodysplasia of stomach and duodenum with bleeding: Secondary | ICD-10-CM | POA: Diagnosis not present

## 2021-12-06 DIAGNOSIS — Z992 Dependence on renal dialysis: Secondary | ICD-10-CM | POA: Diagnosis not present

## 2021-12-06 DIAGNOSIS — L299 Pruritus, unspecified: Secondary | ICD-10-CM | POA: Diagnosis not present

## 2021-12-06 DIAGNOSIS — N2581 Secondary hyperparathyroidism of renal origin: Secondary | ICD-10-CM | POA: Diagnosis not present

## 2021-12-06 DIAGNOSIS — N186 End stage renal disease: Secondary | ICD-10-CM | POA: Diagnosis not present

## 2021-12-09 DIAGNOSIS — Z992 Dependence on renal dialysis: Secondary | ICD-10-CM | POA: Diagnosis not present

## 2021-12-09 DIAGNOSIS — K31811 Angiodysplasia of stomach and duodenum with bleeding: Secondary | ICD-10-CM | POA: Diagnosis not present

## 2021-12-09 DIAGNOSIS — L299 Pruritus, unspecified: Secondary | ICD-10-CM | POA: Diagnosis not present

## 2021-12-09 DIAGNOSIS — N2581 Secondary hyperparathyroidism of renal origin: Secondary | ICD-10-CM | POA: Diagnosis not present

## 2021-12-09 DIAGNOSIS — N186 End stage renal disease: Secondary | ICD-10-CM | POA: Diagnosis not present

## 2021-12-11 DIAGNOSIS — K31811 Angiodysplasia of stomach and duodenum with bleeding: Secondary | ICD-10-CM | POA: Diagnosis not present

## 2021-12-11 DIAGNOSIS — Z992 Dependence on renal dialysis: Secondary | ICD-10-CM | POA: Diagnosis not present

## 2021-12-11 DIAGNOSIS — N186 End stage renal disease: Secondary | ICD-10-CM | POA: Diagnosis not present

## 2021-12-11 DIAGNOSIS — L299 Pruritus, unspecified: Secondary | ICD-10-CM | POA: Diagnosis not present

## 2021-12-11 DIAGNOSIS — N2581 Secondary hyperparathyroidism of renal origin: Secondary | ICD-10-CM | POA: Diagnosis not present

## 2021-12-13 DIAGNOSIS — Z992 Dependence on renal dialysis: Secondary | ICD-10-CM | POA: Diagnosis not present

## 2021-12-13 DIAGNOSIS — L299 Pruritus, unspecified: Secondary | ICD-10-CM | POA: Diagnosis not present

## 2021-12-13 DIAGNOSIS — N2581 Secondary hyperparathyroidism of renal origin: Secondary | ICD-10-CM | POA: Diagnosis not present

## 2021-12-13 DIAGNOSIS — K31811 Angiodysplasia of stomach and duodenum with bleeding: Secondary | ICD-10-CM | POA: Diagnosis not present

## 2021-12-13 DIAGNOSIS — N186 End stage renal disease: Secondary | ICD-10-CM | POA: Diagnosis not present

## 2021-12-16 DIAGNOSIS — Z992 Dependence on renal dialysis: Secondary | ICD-10-CM | POA: Diagnosis not present

## 2021-12-16 DIAGNOSIS — N2581 Secondary hyperparathyroidism of renal origin: Secondary | ICD-10-CM | POA: Diagnosis not present

## 2021-12-16 DIAGNOSIS — L299 Pruritus, unspecified: Secondary | ICD-10-CM | POA: Diagnosis not present

## 2021-12-16 DIAGNOSIS — N186 End stage renal disease: Secondary | ICD-10-CM | POA: Diagnosis not present

## 2021-12-16 DIAGNOSIS — K31811 Angiodysplasia of stomach and duodenum with bleeding: Secondary | ICD-10-CM | POA: Diagnosis not present

## 2021-12-17 DIAGNOSIS — M19011 Primary osteoarthritis, right shoulder: Secondary | ICD-10-CM | POA: Diagnosis not present

## 2021-12-18 DIAGNOSIS — K31811 Angiodysplasia of stomach and duodenum with bleeding: Secondary | ICD-10-CM | POA: Diagnosis not present

## 2021-12-18 DIAGNOSIS — L299 Pruritus, unspecified: Secondary | ICD-10-CM | POA: Diagnosis not present

## 2021-12-18 DIAGNOSIS — Z992 Dependence on renal dialysis: Secondary | ICD-10-CM | POA: Diagnosis not present

## 2021-12-18 DIAGNOSIS — N186 End stage renal disease: Secondary | ICD-10-CM | POA: Diagnosis not present

## 2021-12-18 DIAGNOSIS — N2581 Secondary hyperparathyroidism of renal origin: Secondary | ICD-10-CM | POA: Diagnosis not present

## 2021-12-19 ENCOUNTER — Other Ambulatory Visit (HOSPITAL_COMMUNITY): Payer: Self-pay | Admitting: Nephrology

## 2021-12-19 ENCOUNTER — Other Ambulatory Visit: Payer: Self-pay | Admitting: Nephrology

## 2021-12-19 DIAGNOSIS — E213 Hyperparathyroidism, unspecified: Secondary | ICD-10-CM

## 2021-12-20 ENCOUNTER — Other Ambulatory Visit: Payer: Self-pay | Admitting: Nephrology

## 2021-12-20 DIAGNOSIS — K31811 Angiodysplasia of stomach and duodenum with bleeding: Secondary | ICD-10-CM | POA: Diagnosis not present

## 2021-12-20 DIAGNOSIS — N2581 Secondary hyperparathyroidism of renal origin: Secondary | ICD-10-CM | POA: Diagnosis not present

## 2021-12-20 DIAGNOSIS — Z992 Dependence on renal dialysis: Secondary | ICD-10-CM | POA: Diagnosis not present

## 2021-12-20 DIAGNOSIS — E213 Hyperparathyroidism, unspecified: Secondary | ICD-10-CM

## 2021-12-20 DIAGNOSIS — N186 End stage renal disease: Secondary | ICD-10-CM | POA: Diagnosis not present

## 2021-12-20 DIAGNOSIS — L299 Pruritus, unspecified: Secondary | ICD-10-CM | POA: Diagnosis not present

## 2021-12-23 DIAGNOSIS — N186 End stage renal disease: Secondary | ICD-10-CM | POA: Diagnosis not present

## 2021-12-23 DIAGNOSIS — Z992 Dependence on renal dialysis: Secondary | ICD-10-CM | POA: Diagnosis not present

## 2021-12-23 DIAGNOSIS — L299 Pruritus, unspecified: Secondary | ICD-10-CM | POA: Diagnosis not present

## 2021-12-23 DIAGNOSIS — N2581 Secondary hyperparathyroidism of renal origin: Secondary | ICD-10-CM | POA: Diagnosis not present

## 2021-12-23 DIAGNOSIS — K31811 Angiodysplasia of stomach and duodenum with bleeding: Secondary | ICD-10-CM | POA: Diagnosis not present

## 2021-12-25 DIAGNOSIS — Z992 Dependence on renal dialysis: Secondary | ICD-10-CM | POA: Diagnosis not present

## 2021-12-25 DIAGNOSIS — K31811 Angiodysplasia of stomach and duodenum with bleeding: Secondary | ICD-10-CM | POA: Diagnosis not present

## 2021-12-25 DIAGNOSIS — L299 Pruritus, unspecified: Secondary | ICD-10-CM | POA: Diagnosis not present

## 2021-12-25 DIAGNOSIS — N2581 Secondary hyperparathyroidism of renal origin: Secondary | ICD-10-CM | POA: Diagnosis not present

## 2021-12-25 DIAGNOSIS — N186 End stage renal disease: Secondary | ICD-10-CM | POA: Diagnosis not present

## 2021-12-27 DIAGNOSIS — L299 Pruritus, unspecified: Secondary | ICD-10-CM | POA: Diagnosis not present

## 2021-12-27 DIAGNOSIS — N186 End stage renal disease: Secondary | ICD-10-CM | POA: Diagnosis not present

## 2021-12-27 DIAGNOSIS — Z992 Dependence on renal dialysis: Secondary | ICD-10-CM | POA: Diagnosis not present

## 2021-12-27 DIAGNOSIS — K31811 Angiodysplasia of stomach and duodenum with bleeding: Secondary | ICD-10-CM | POA: Diagnosis not present

## 2021-12-27 DIAGNOSIS — N2581 Secondary hyperparathyroidism of renal origin: Secondary | ICD-10-CM | POA: Diagnosis not present

## 2021-12-30 DIAGNOSIS — N2581 Secondary hyperparathyroidism of renal origin: Secondary | ICD-10-CM | POA: Diagnosis not present

## 2021-12-30 DIAGNOSIS — L299 Pruritus, unspecified: Secondary | ICD-10-CM | POA: Diagnosis not present

## 2021-12-30 DIAGNOSIS — N186 End stage renal disease: Secondary | ICD-10-CM | POA: Diagnosis not present

## 2021-12-30 DIAGNOSIS — Z992 Dependence on renal dialysis: Secondary | ICD-10-CM | POA: Diagnosis not present

## 2021-12-30 DIAGNOSIS — K31811 Angiodysplasia of stomach and duodenum with bleeding: Secondary | ICD-10-CM | POA: Diagnosis not present

## 2022-01-01 DIAGNOSIS — I129 Hypertensive chronic kidney disease with stage 1 through stage 4 chronic kidney disease, or unspecified chronic kidney disease: Secondary | ICD-10-CM | POA: Diagnosis not present

## 2022-01-01 DIAGNOSIS — N186 End stage renal disease: Secondary | ICD-10-CM | POA: Diagnosis not present

## 2022-01-01 DIAGNOSIS — K31811 Angiodysplasia of stomach and duodenum with bleeding: Secondary | ICD-10-CM | POA: Diagnosis not present

## 2022-01-01 DIAGNOSIS — L299 Pruritus, unspecified: Secondary | ICD-10-CM | POA: Diagnosis not present

## 2022-01-01 DIAGNOSIS — Z992 Dependence on renal dialysis: Secondary | ICD-10-CM | POA: Diagnosis not present

## 2022-01-01 DIAGNOSIS — N2581 Secondary hyperparathyroidism of renal origin: Secondary | ICD-10-CM | POA: Diagnosis not present

## 2022-01-02 ENCOUNTER — Other Ambulatory Visit: Payer: Medicare Other

## 2022-01-03 DIAGNOSIS — D509 Iron deficiency anemia, unspecified: Secondary | ICD-10-CM | POA: Diagnosis not present

## 2022-01-03 DIAGNOSIS — N2581 Secondary hyperparathyroidism of renal origin: Secondary | ICD-10-CM | POA: Diagnosis not present

## 2022-01-03 DIAGNOSIS — K31811 Angiodysplasia of stomach and duodenum with bleeding: Secondary | ICD-10-CM | POA: Diagnosis not present

## 2022-01-03 DIAGNOSIS — E1129 Type 2 diabetes mellitus with other diabetic kidney complication: Secondary | ICD-10-CM | POA: Diagnosis not present

## 2022-01-03 DIAGNOSIS — N186 End stage renal disease: Secondary | ICD-10-CM | POA: Diagnosis not present

## 2022-01-03 DIAGNOSIS — Z992 Dependence on renal dialysis: Secondary | ICD-10-CM | POA: Diagnosis not present

## 2022-01-03 DIAGNOSIS — L299 Pruritus, unspecified: Secondary | ICD-10-CM | POA: Diagnosis not present

## 2022-01-06 DIAGNOSIS — E1129 Type 2 diabetes mellitus with other diabetic kidney complication: Secondary | ICD-10-CM | POA: Diagnosis not present

## 2022-01-06 DIAGNOSIS — K31811 Angiodysplasia of stomach and duodenum with bleeding: Secondary | ICD-10-CM | POA: Diagnosis not present

## 2022-01-06 DIAGNOSIS — L299 Pruritus, unspecified: Secondary | ICD-10-CM | POA: Diagnosis not present

## 2022-01-06 DIAGNOSIS — Z992 Dependence on renal dialysis: Secondary | ICD-10-CM | POA: Diagnosis not present

## 2022-01-06 DIAGNOSIS — N2581 Secondary hyperparathyroidism of renal origin: Secondary | ICD-10-CM | POA: Diagnosis not present

## 2022-01-06 DIAGNOSIS — D509 Iron deficiency anemia, unspecified: Secondary | ICD-10-CM | POA: Diagnosis not present

## 2022-01-06 DIAGNOSIS — N186 End stage renal disease: Secondary | ICD-10-CM | POA: Diagnosis not present

## 2022-01-08 DIAGNOSIS — Z992 Dependence on renal dialysis: Secondary | ICD-10-CM | POA: Diagnosis not present

## 2022-01-08 DIAGNOSIS — D509 Iron deficiency anemia, unspecified: Secondary | ICD-10-CM | POA: Diagnosis not present

## 2022-01-08 DIAGNOSIS — K31811 Angiodysplasia of stomach and duodenum with bleeding: Secondary | ICD-10-CM | POA: Diagnosis not present

## 2022-01-08 DIAGNOSIS — N186 End stage renal disease: Secondary | ICD-10-CM | POA: Diagnosis not present

## 2022-01-08 DIAGNOSIS — E1129 Type 2 diabetes mellitus with other diabetic kidney complication: Secondary | ICD-10-CM | POA: Diagnosis not present

## 2022-01-08 DIAGNOSIS — N2581 Secondary hyperparathyroidism of renal origin: Secondary | ICD-10-CM | POA: Diagnosis not present

## 2022-01-08 DIAGNOSIS — L299 Pruritus, unspecified: Secondary | ICD-10-CM | POA: Diagnosis not present

## 2022-01-10 DIAGNOSIS — N186 End stage renal disease: Secondary | ICD-10-CM | POA: Diagnosis not present

## 2022-01-10 DIAGNOSIS — K31811 Angiodysplasia of stomach and duodenum with bleeding: Secondary | ICD-10-CM | POA: Diagnosis not present

## 2022-01-10 DIAGNOSIS — E1129 Type 2 diabetes mellitus with other diabetic kidney complication: Secondary | ICD-10-CM | POA: Diagnosis not present

## 2022-01-10 DIAGNOSIS — Z992 Dependence on renal dialysis: Secondary | ICD-10-CM | POA: Diagnosis not present

## 2022-01-10 DIAGNOSIS — D509 Iron deficiency anemia, unspecified: Secondary | ICD-10-CM | POA: Diagnosis not present

## 2022-01-10 DIAGNOSIS — N2581 Secondary hyperparathyroidism of renal origin: Secondary | ICD-10-CM | POA: Diagnosis not present

## 2022-01-10 DIAGNOSIS — L299 Pruritus, unspecified: Secondary | ICD-10-CM | POA: Diagnosis not present

## 2022-01-13 ENCOUNTER — Telehealth: Payer: Self-pay

## 2022-01-13 DIAGNOSIS — K31811 Angiodysplasia of stomach and duodenum with bleeding: Secondary | ICD-10-CM | POA: Diagnosis not present

## 2022-01-13 DIAGNOSIS — L299 Pruritus, unspecified: Secondary | ICD-10-CM | POA: Diagnosis not present

## 2022-01-13 DIAGNOSIS — N2581 Secondary hyperparathyroidism of renal origin: Secondary | ICD-10-CM | POA: Diagnosis not present

## 2022-01-13 DIAGNOSIS — Z992 Dependence on renal dialysis: Secondary | ICD-10-CM | POA: Diagnosis not present

## 2022-01-13 DIAGNOSIS — D509 Iron deficiency anemia, unspecified: Secondary | ICD-10-CM | POA: Diagnosis not present

## 2022-01-13 DIAGNOSIS — E1129 Type 2 diabetes mellitus with other diabetic kidney complication: Secondary | ICD-10-CM | POA: Diagnosis not present

## 2022-01-13 DIAGNOSIS — N186 End stage renal disease: Secondary | ICD-10-CM | POA: Diagnosis not present

## 2022-01-13 NOTE — Telephone Encounter (Unsigned)
Left message for pt to call back to schedule EGD to ensure healing of Gastric Ulcer:

## 2022-01-13 NOTE — Telephone Encounter (Signed)
-----   Message from Gillermina Hu, RN sent at 11/05/2021  3:28 PM EDT ----- Regarding: Repeat EGD Recommendation to Repeat EGD in 12 weeks

## 2022-01-14 NOTE — Telephone Encounter (Unsigned)
Left message for pt to call back to schedule EGD to ensure healing of Gastric Ulcer:

## 2022-01-15 DIAGNOSIS — K31811 Angiodysplasia of stomach and duodenum with bleeding: Secondary | ICD-10-CM | POA: Diagnosis not present

## 2022-01-15 DIAGNOSIS — D509 Iron deficiency anemia, unspecified: Secondary | ICD-10-CM | POA: Diagnosis not present

## 2022-01-15 DIAGNOSIS — E1129 Type 2 diabetes mellitus with other diabetic kidney complication: Secondary | ICD-10-CM | POA: Diagnosis not present

## 2022-01-15 DIAGNOSIS — L299 Pruritus, unspecified: Secondary | ICD-10-CM | POA: Diagnosis not present

## 2022-01-15 DIAGNOSIS — N2581 Secondary hyperparathyroidism of renal origin: Secondary | ICD-10-CM | POA: Diagnosis not present

## 2022-01-15 DIAGNOSIS — Z992 Dependence on renal dialysis: Secondary | ICD-10-CM | POA: Diagnosis not present

## 2022-01-15 DIAGNOSIS — N186 End stage renal disease: Secondary | ICD-10-CM | POA: Diagnosis not present

## 2022-01-15 NOTE — Telephone Encounter (Signed)
Patient returning call. Please give patient a call back to advise.  Thank you

## 2022-01-15 NOTE — Telephone Encounter (Signed)
Left message for pt to call back  °

## 2022-01-16 ENCOUNTER — Ambulatory Visit: Payer: Medicare Other

## 2022-01-16 NOTE — Telephone Encounter (Signed)
Left Message for pt to call back  °

## 2022-01-17 DIAGNOSIS — E1129 Type 2 diabetes mellitus with other diabetic kidney complication: Secondary | ICD-10-CM | POA: Diagnosis not present

## 2022-01-17 DIAGNOSIS — K31811 Angiodysplasia of stomach and duodenum with bleeding: Secondary | ICD-10-CM | POA: Diagnosis not present

## 2022-01-17 DIAGNOSIS — L299 Pruritus, unspecified: Secondary | ICD-10-CM | POA: Diagnosis not present

## 2022-01-17 DIAGNOSIS — D509 Iron deficiency anemia, unspecified: Secondary | ICD-10-CM | POA: Diagnosis not present

## 2022-01-17 DIAGNOSIS — N186 End stage renal disease: Secondary | ICD-10-CM | POA: Diagnosis not present

## 2022-01-17 DIAGNOSIS — N2581 Secondary hyperparathyroidism of renal origin: Secondary | ICD-10-CM | POA: Diagnosis not present

## 2022-01-17 DIAGNOSIS — Z992 Dependence on renal dialysis: Secondary | ICD-10-CM | POA: Diagnosis not present

## 2022-01-17 NOTE — Telephone Encounter (Signed)
Left message for pt to call back  °

## 2022-01-20 ENCOUNTER — Telehealth: Payer: Self-pay | Admitting: *Deleted

## 2022-01-20 DIAGNOSIS — D509 Iron deficiency anemia, unspecified: Secondary | ICD-10-CM | POA: Diagnosis not present

## 2022-01-20 DIAGNOSIS — E1129 Type 2 diabetes mellitus with other diabetic kidney complication: Secondary | ICD-10-CM | POA: Diagnosis not present

## 2022-01-20 DIAGNOSIS — K31811 Angiodysplasia of stomach and duodenum with bleeding: Secondary | ICD-10-CM | POA: Diagnosis not present

## 2022-01-20 DIAGNOSIS — N186 End stage renal disease: Secondary | ICD-10-CM | POA: Diagnosis not present

## 2022-01-20 DIAGNOSIS — N2581 Secondary hyperparathyroidism of renal origin: Secondary | ICD-10-CM | POA: Diagnosis not present

## 2022-01-20 DIAGNOSIS — L299 Pruritus, unspecified: Secondary | ICD-10-CM | POA: Diagnosis not present

## 2022-01-20 DIAGNOSIS — M25511 Pain in right shoulder: Secondary | ICD-10-CM

## 2022-01-20 DIAGNOSIS — Z992 Dependence on renal dialysis: Secondary | ICD-10-CM | POA: Diagnosis not present

## 2022-01-20 MED ORDER — DICLOFENAC SODIUM 1 % EX GEL: 2.0000 g | Freq: Two times a day (BID) | CUTANEOUS | 0 refills | Status: AC | PRN

## 2022-01-20 NOTE — Telephone Encounter (Unsigned)
Left message for pt to call back  °

## 2022-01-20 NOTE — Telephone Encounter (Signed)
Fax from [employer] - pt is requesting a refill on Diclofenac 1% Gel apply 2 G topically twice a day as needed; med is not on current med list. Thanks

## 2022-01-21 NOTE — Telephone Encounter (Signed)
Unable to reach pt. Pt mailbox is full: Unable to leave message

## 2022-01-22 ENCOUNTER — Other Ambulatory Visit: Payer: Self-pay

## 2022-01-22 ENCOUNTER — Encounter: Payer: Self-pay | Admitting: Internal Medicine

## 2022-01-22 DIAGNOSIS — E1129 Type 2 diabetes mellitus with other diabetic kidney complication: Secondary | ICD-10-CM | POA: Diagnosis not present

## 2022-01-22 DIAGNOSIS — Z992 Dependence on renal dialysis: Secondary | ICD-10-CM | POA: Diagnosis not present

## 2022-01-22 DIAGNOSIS — L299 Pruritus, unspecified: Secondary | ICD-10-CM | POA: Diagnosis not present

## 2022-01-22 DIAGNOSIS — N2581 Secondary hyperparathyroidism of renal origin: Secondary | ICD-10-CM | POA: Diagnosis not present

## 2022-01-22 DIAGNOSIS — D509 Iron deficiency anemia, unspecified: Secondary | ICD-10-CM | POA: Diagnosis not present

## 2022-01-22 DIAGNOSIS — N186 End stage renal disease: Secondary | ICD-10-CM | POA: Diagnosis not present

## 2022-01-22 DIAGNOSIS — K31811 Angiodysplasia of stomach and duodenum with bleeding: Secondary | ICD-10-CM | POA: Diagnosis not present

## 2022-01-22 DIAGNOSIS — K259 Gastric ulcer, unspecified as acute or chronic, without hemorrhage or perforation: Secondary | ICD-10-CM

## 2022-01-22 NOTE — Telephone Encounter (Signed)
Pt was notified of Dr. Lyndel Safe recommendations for a repeat EGD in 12 weeks to ensure healing of Gastric Ulcer: Pt stated that she could only do Tuesday and Thursday due to her being a dialysis pt:  Pt was scheduled on 03/06/2022 at 1:30 with Dr. Lyndel Safe in the [facility] for repeat EGD: Pt made aware Prep instructions were created and sent to pt via my chart. Pt made aware: Ambulatory referral to GI  sent in Epic: Pt verbalized understanding with all questions answered.

## 2022-01-24 DIAGNOSIS — N186 End stage renal disease: Secondary | ICD-10-CM | POA: Diagnosis not present

## 2022-01-24 DIAGNOSIS — L299 Pruritus, unspecified: Secondary | ICD-10-CM | POA: Diagnosis not present

## 2022-01-24 DIAGNOSIS — N2581 Secondary hyperparathyroidism of renal origin: Secondary | ICD-10-CM | POA: Diagnosis not present

## 2022-01-24 DIAGNOSIS — E1129 Type 2 diabetes mellitus with other diabetic kidney complication: Secondary | ICD-10-CM | POA: Diagnosis not present

## 2022-01-24 DIAGNOSIS — K31811 Angiodysplasia of stomach and duodenum with bleeding: Secondary | ICD-10-CM | POA: Diagnosis not present

## 2022-01-24 DIAGNOSIS — Z992 Dependence on renal dialysis: Secondary | ICD-10-CM | POA: Diagnosis not present

## 2022-01-24 DIAGNOSIS — D509 Iron deficiency anemia, unspecified: Secondary | ICD-10-CM | POA: Diagnosis not present

## 2022-01-27 DIAGNOSIS — N2581 Secondary hyperparathyroidism of renal origin: Secondary | ICD-10-CM | POA: Diagnosis not present

## 2022-01-27 DIAGNOSIS — Z992 Dependence on renal dialysis: Secondary | ICD-10-CM | POA: Diagnosis not present

## 2022-01-27 DIAGNOSIS — L299 Pruritus, unspecified: Secondary | ICD-10-CM | POA: Diagnosis not present

## 2022-01-27 DIAGNOSIS — N186 End stage renal disease: Secondary | ICD-10-CM | POA: Diagnosis not present

## 2022-01-27 DIAGNOSIS — D509 Iron deficiency anemia, unspecified: Secondary | ICD-10-CM | POA: Diagnosis not present

## 2022-01-27 DIAGNOSIS — E1129 Type 2 diabetes mellitus with other diabetic kidney complication: Secondary | ICD-10-CM | POA: Diagnosis not present

## 2022-01-27 DIAGNOSIS — K31811 Angiodysplasia of stomach and duodenum with bleeding: Secondary | ICD-10-CM | POA: Diagnosis not present

## 2022-01-29 ENCOUNTER — Other Ambulatory Visit: Payer: Self-pay | Admitting: Surgery

## 2022-01-29 DIAGNOSIS — N186 End stage renal disease: Secondary | ICD-10-CM | POA: Diagnosis not present

## 2022-01-29 DIAGNOSIS — N2581 Secondary hyperparathyroidism of renal origin: Secondary | ICD-10-CM | POA: Diagnosis not present

## 2022-01-29 DIAGNOSIS — E041 Nontoxic single thyroid nodule: Secondary | ICD-10-CM

## 2022-01-29 DIAGNOSIS — D509 Iron deficiency anemia, unspecified: Secondary | ICD-10-CM | POA: Diagnosis not present

## 2022-01-29 DIAGNOSIS — Z992 Dependence on renal dialysis: Secondary | ICD-10-CM | POA: Diagnosis not present

## 2022-01-29 DIAGNOSIS — L299 Pruritus, unspecified: Secondary | ICD-10-CM | POA: Diagnosis not present

## 2022-01-29 DIAGNOSIS — K31811 Angiodysplasia of stomach and duodenum with bleeding: Secondary | ICD-10-CM | POA: Diagnosis not present

## 2022-01-29 DIAGNOSIS — E1129 Type 2 diabetes mellitus with other diabetic kidney complication: Secondary | ICD-10-CM | POA: Diagnosis not present

## 2022-01-31 DIAGNOSIS — K31811 Angiodysplasia of stomach and duodenum with bleeding: Secondary | ICD-10-CM | POA: Diagnosis not present

## 2022-01-31 DIAGNOSIS — E1129 Type 2 diabetes mellitus with other diabetic kidney complication: Secondary | ICD-10-CM | POA: Diagnosis not present

## 2022-01-31 DIAGNOSIS — L299 Pruritus, unspecified: Secondary | ICD-10-CM | POA: Diagnosis not present

## 2022-01-31 DIAGNOSIS — N186 End stage renal disease: Secondary | ICD-10-CM | POA: Diagnosis not present

## 2022-01-31 DIAGNOSIS — D509 Iron deficiency anemia, unspecified: Secondary | ICD-10-CM | POA: Diagnosis not present

## 2022-01-31 DIAGNOSIS — N2581 Secondary hyperparathyroidism of renal origin: Secondary | ICD-10-CM | POA: Diagnosis not present

## 2022-01-31 DIAGNOSIS — I129 Hypertensive chronic kidney disease with stage 1 through stage 4 chronic kidney disease, or unspecified chronic kidney disease: Secondary | ICD-10-CM | POA: Diagnosis not present

## 2022-01-31 DIAGNOSIS — Z992 Dependence on renal dialysis: Secondary | ICD-10-CM | POA: Diagnosis not present

## 2022-02-03 DIAGNOSIS — N2581 Secondary hyperparathyroidism of renal origin: Secondary | ICD-10-CM | POA: Diagnosis not present

## 2022-02-03 DIAGNOSIS — E1129 Type 2 diabetes mellitus with other diabetic kidney complication: Secondary | ICD-10-CM | POA: Diagnosis not present

## 2022-02-03 DIAGNOSIS — D631 Anemia in chronic kidney disease: Secondary | ICD-10-CM | POA: Diagnosis not present

## 2022-02-03 DIAGNOSIS — L299 Pruritus, unspecified: Secondary | ICD-10-CM | POA: Diagnosis not present

## 2022-02-03 DIAGNOSIS — Z992 Dependence on renal dialysis: Secondary | ICD-10-CM | POA: Diagnosis not present

## 2022-02-03 DIAGNOSIS — N186 End stage renal disease: Secondary | ICD-10-CM | POA: Diagnosis not present

## 2022-02-03 DIAGNOSIS — K31811 Angiodysplasia of stomach and duodenum with bleeding: Secondary | ICD-10-CM | POA: Diagnosis not present

## 2022-02-05 DIAGNOSIS — L299 Pruritus, unspecified: Secondary | ICD-10-CM | POA: Diagnosis not present

## 2022-02-05 DIAGNOSIS — K31811 Angiodysplasia of stomach and duodenum with bleeding: Secondary | ICD-10-CM | POA: Diagnosis not present

## 2022-02-05 DIAGNOSIS — D631 Anemia in chronic kidney disease: Secondary | ICD-10-CM | POA: Diagnosis not present

## 2022-02-05 DIAGNOSIS — E1129 Type 2 diabetes mellitus with other diabetic kidney complication: Secondary | ICD-10-CM | POA: Diagnosis not present

## 2022-02-05 DIAGNOSIS — N186 End stage renal disease: Secondary | ICD-10-CM | POA: Diagnosis not present

## 2022-02-05 DIAGNOSIS — N2581 Secondary hyperparathyroidism of renal origin: Secondary | ICD-10-CM | POA: Diagnosis not present

## 2022-02-05 DIAGNOSIS — Z992 Dependence on renal dialysis: Secondary | ICD-10-CM | POA: Diagnosis not present

## 2022-02-06 ENCOUNTER — Ambulatory Visit
Admission: RE | Admit: 2022-02-06 | Discharge: 2022-02-06 | Disposition: A | Discharge status: Home / Self Care | Payer: Medicare Other | Source: Ambulatory Visit | Attending: Surgery | Admitting: Surgery

## 2022-02-06 ENCOUNTER — Ambulatory Visit: Payer: Self-pay | Admitting: Surgery

## 2022-02-06 DIAGNOSIS — E041 Nontoxic single thyroid nodule: Secondary | ICD-10-CM

## 2022-02-06 DIAGNOSIS — N2581 Secondary hyperparathyroidism of renal origin: Secondary | ICD-10-CM

## 2022-02-06 NOTE — Progress Notes (Signed)
Thyroid USN demonstrates stable, benign disease.  No indication for thyroid removal at this time.  Will plan to proceed with total parathyroidectomy with autotransplantation for management of secondary hyperparathyroidism as we discussed.  Will enter orders and send to schedulers today.  [place], MD [facility] [place] practice Office: [phone]

## 2022-02-07 DIAGNOSIS — N2581 Secondary hyperparathyroidism of renal origin: Secondary | ICD-10-CM | POA: Diagnosis not present

## 2022-02-07 DIAGNOSIS — Z992 Dependence on renal dialysis: Secondary | ICD-10-CM | POA: Diagnosis not present

## 2022-02-07 DIAGNOSIS — E1129 Type 2 diabetes mellitus with other diabetic kidney complication: Secondary | ICD-10-CM | POA: Diagnosis not present

## 2022-02-07 DIAGNOSIS — K31811 Angiodysplasia of stomach and duodenum with bleeding: Secondary | ICD-10-CM | POA: Diagnosis not present

## 2022-02-07 DIAGNOSIS — N186 End stage renal disease: Secondary | ICD-10-CM | POA: Diagnosis not present

## 2022-02-07 DIAGNOSIS — D631 Anemia in chronic kidney disease: Secondary | ICD-10-CM | POA: Diagnosis not present

## 2022-02-07 DIAGNOSIS — L299 Pruritus, unspecified: Secondary | ICD-10-CM | POA: Diagnosis not present

## 2022-02-10 DIAGNOSIS — L299 Pruritus, unspecified: Secondary | ICD-10-CM | POA: Diagnosis not present

## 2022-02-10 DIAGNOSIS — Z992 Dependence on renal dialysis: Secondary | ICD-10-CM | POA: Diagnosis not present

## 2022-02-10 DIAGNOSIS — D631 Anemia in chronic kidney disease: Secondary | ICD-10-CM | POA: Diagnosis not present

## 2022-02-10 DIAGNOSIS — N186 End stage renal disease: Secondary | ICD-10-CM | POA: Diagnosis not present

## 2022-02-10 DIAGNOSIS — K31811 Angiodysplasia of stomach and duodenum with bleeding: Secondary | ICD-10-CM | POA: Diagnosis not present

## 2022-02-10 DIAGNOSIS — E1129 Type 2 diabetes mellitus with other diabetic kidney complication: Secondary | ICD-10-CM | POA: Diagnosis not present

## 2022-02-10 DIAGNOSIS — N2581 Secondary hyperparathyroidism of renal origin: Secondary | ICD-10-CM | POA: Diagnosis not present

## 2022-02-12 DIAGNOSIS — L299 Pruritus, unspecified: Secondary | ICD-10-CM | POA: Diagnosis not present

## 2022-02-12 DIAGNOSIS — E1129 Type 2 diabetes mellitus with other diabetic kidney complication: Secondary | ICD-10-CM | POA: Diagnosis not present

## 2022-02-12 DIAGNOSIS — K31811 Angiodysplasia of stomach and duodenum with bleeding: Secondary | ICD-10-CM | POA: Diagnosis not present

## 2022-02-12 DIAGNOSIS — N2581 Secondary hyperparathyroidism of renal origin: Secondary | ICD-10-CM | POA: Diagnosis not present

## 2022-02-12 DIAGNOSIS — N186 End stage renal disease: Secondary | ICD-10-CM | POA: Diagnosis not present

## 2022-02-12 DIAGNOSIS — D631 Anemia in chronic kidney disease: Secondary | ICD-10-CM | POA: Diagnosis not present

## 2022-02-12 DIAGNOSIS — Z992 Dependence on renal dialysis: Secondary | ICD-10-CM | POA: Diagnosis not present

## 2022-02-14 DIAGNOSIS — N186 End stage renal disease: Secondary | ICD-10-CM | POA: Diagnosis not present

## 2022-02-14 DIAGNOSIS — K31811 Angiodysplasia of stomach and duodenum with bleeding: Secondary | ICD-10-CM | POA: Diagnosis not present

## 2022-02-14 DIAGNOSIS — E1129 Type 2 diabetes mellitus with other diabetic kidney complication: Secondary | ICD-10-CM | POA: Diagnosis not present

## 2022-02-14 DIAGNOSIS — D631 Anemia in chronic kidney disease: Secondary | ICD-10-CM | POA: Diagnosis not present

## 2022-02-14 DIAGNOSIS — L299 Pruritus, unspecified: Secondary | ICD-10-CM | POA: Diagnosis not present

## 2022-02-14 DIAGNOSIS — Z992 Dependence on renal dialysis: Secondary | ICD-10-CM | POA: Diagnosis not present

## 2022-02-14 DIAGNOSIS — N2581 Secondary hyperparathyroidism of renal origin: Secondary | ICD-10-CM | POA: Diagnosis not present

## 2022-02-17 DIAGNOSIS — L299 Pruritus, unspecified: Secondary | ICD-10-CM | POA: Diagnosis not present

## 2022-02-17 DIAGNOSIS — N2581 Secondary hyperparathyroidism of renal origin: Secondary | ICD-10-CM | POA: Diagnosis not present

## 2022-02-17 DIAGNOSIS — K31811 Angiodysplasia of stomach and duodenum with bleeding: Secondary | ICD-10-CM | POA: Diagnosis not present

## 2022-02-17 DIAGNOSIS — N186 End stage renal disease: Secondary | ICD-10-CM | POA: Diagnosis not present

## 2022-02-17 DIAGNOSIS — Z992 Dependence on renal dialysis: Secondary | ICD-10-CM | POA: Diagnosis not present

## 2022-02-17 DIAGNOSIS — E1129 Type 2 diabetes mellitus with other diabetic kidney complication: Secondary | ICD-10-CM | POA: Diagnosis not present

## 2022-02-17 DIAGNOSIS — D631 Anemia in chronic kidney disease: Secondary | ICD-10-CM | POA: Diagnosis not present

## 2022-02-19 DIAGNOSIS — K31811 Angiodysplasia of stomach and duodenum with bleeding: Secondary | ICD-10-CM | POA: Diagnosis not present

## 2022-02-19 DIAGNOSIS — E1129 Type 2 diabetes mellitus with other diabetic kidney complication: Secondary | ICD-10-CM | POA: Diagnosis not present

## 2022-02-19 DIAGNOSIS — N2581 Secondary hyperparathyroidism of renal origin: Secondary | ICD-10-CM | POA: Diagnosis not present

## 2022-02-19 DIAGNOSIS — N186 End stage renal disease: Secondary | ICD-10-CM | POA: Diagnosis not present

## 2022-02-19 DIAGNOSIS — Z992 Dependence on renal dialysis: Secondary | ICD-10-CM | POA: Diagnosis not present

## 2022-02-19 DIAGNOSIS — D631 Anemia in chronic kidney disease: Secondary | ICD-10-CM | POA: Diagnosis not present

## 2022-02-19 DIAGNOSIS — L299 Pruritus, unspecified: Secondary | ICD-10-CM | POA: Diagnosis not present

## 2022-02-21 DIAGNOSIS — N186 End stage renal disease: Secondary | ICD-10-CM | POA: Diagnosis not present

## 2022-02-21 DIAGNOSIS — N2581 Secondary hyperparathyroidism of renal origin: Secondary | ICD-10-CM | POA: Diagnosis not present

## 2022-02-21 DIAGNOSIS — K31811 Angiodysplasia of stomach and duodenum with bleeding: Secondary | ICD-10-CM | POA: Diagnosis not present

## 2022-02-21 DIAGNOSIS — Z992 Dependence on renal dialysis: Secondary | ICD-10-CM | POA: Diagnosis not present

## 2022-02-21 DIAGNOSIS — E1129 Type 2 diabetes mellitus with other diabetic kidney complication: Secondary | ICD-10-CM | POA: Diagnosis not present

## 2022-02-21 DIAGNOSIS — L299 Pruritus, unspecified: Secondary | ICD-10-CM | POA: Diagnosis not present

## 2022-02-21 DIAGNOSIS — D631 Anemia in chronic kidney disease: Secondary | ICD-10-CM | POA: Diagnosis not present

## 2022-02-24 DIAGNOSIS — E1129 Type 2 diabetes mellitus with other diabetic kidney complication: Secondary | ICD-10-CM | POA: Diagnosis not present

## 2022-02-24 DIAGNOSIS — N2581 Secondary hyperparathyroidism of renal origin: Secondary | ICD-10-CM | POA: Diagnosis not present

## 2022-02-24 DIAGNOSIS — K31811 Angiodysplasia of stomach and duodenum with bleeding: Secondary | ICD-10-CM | POA: Diagnosis not present

## 2022-02-24 DIAGNOSIS — L299 Pruritus, unspecified: Secondary | ICD-10-CM | POA: Diagnosis not present

## 2022-02-24 DIAGNOSIS — Z992 Dependence on renal dialysis: Secondary | ICD-10-CM | POA: Diagnosis not present

## 2022-02-24 DIAGNOSIS — D631 Anemia in chronic kidney disease: Secondary | ICD-10-CM | POA: Diagnosis not present

## 2022-02-24 DIAGNOSIS — N186 End stage renal disease: Secondary | ICD-10-CM | POA: Diagnosis not present

## 2022-02-26 DIAGNOSIS — Z992 Dependence on renal dialysis: Secondary | ICD-10-CM | POA: Diagnosis not present

## 2022-02-26 DIAGNOSIS — N2581 Secondary hyperparathyroidism of renal origin: Secondary | ICD-10-CM | POA: Diagnosis not present

## 2022-02-26 DIAGNOSIS — L299 Pruritus, unspecified: Secondary | ICD-10-CM | POA: Diagnosis not present

## 2022-02-26 DIAGNOSIS — N186 End stage renal disease: Secondary | ICD-10-CM | POA: Diagnosis not present

## 2022-02-26 DIAGNOSIS — D631 Anemia in chronic kidney disease: Secondary | ICD-10-CM | POA: Diagnosis not present

## 2022-02-26 DIAGNOSIS — K31811 Angiodysplasia of stomach and duodenum with bleeding: Secondary | ICD-10-CM | POA: Diagnosis not present

## 2022-02-26 DIAGNOSIS — E1129 Type 2 diabetes mellitus with other diabetic kidney complication: Secondary | ICD-10-CM | POA: Diagnosis not present

## 2022-02-28 DIAGNOSIS — D631 Anemia in chronic kidney disease: Secondary | ICD-10-CM | POA: Diagnosis not present

## 2022-02-28 DIAGNOSIS — E1129 Type 2 diabetes mellitus with other diabetic kidney complication: Secondary | ICD-10-CM | POA: Diagnosis not present

## 2022-02-28 DIAGNOSIS — K31811 Angiodysplasia of stomach and duodenum with bleeding: Secondary | ICD-10-CM | POA: Diagnosis not present

## 2022-02-28 DIAGNOSIS — N2581 Secondary hyperparathyroidism of renal origin: Secondary | ICD-10-CM | POA: Diagnosis not present

## 2022-02-28 DIAGNOSIS — Z992 Dependence on renal dialysis: Secondary | ICD-10-CM | POA: Diagnosis not present

## 2022-02-28 DIAGNOSIS — L299 Pruritus, unspecified: Secondary | ICD-10-CM | POA: Diagnosis not present

## 2022-02-28 DIAGNOSIS — N186 End stage renal disease: Secondary | ICD-10-CM | POA: Diagnosis not present

## 2022-03-03 DIAGNOSIS — E1129 Type 2 diabetes mellitus with other diabetic kidney complication: Secondary | ICD-10-CM | POA: Diagnosis not present

## 2022-03-03 DIAGNOSIS — Z992 Dependence on renal dialysis: Secondary | ICD-10-CM | POA: Diagnosis not present

## 2022-03-03 DIAGNOSIS — N186 End stage renal disease: Secondary | ICD-10-CM | POA: Diagnosis not present

## 2022-03-03 DIAGNOSIS — I129 Hypertensive chronic kidney disease with stage 1 through stage 4 chronic kidney disease, or unspecified chronic kidney disease: Secondary | ICD-10-CM | POA: Diagnosis not present

## 2022-03-03 DIAGNOSIS — N2581 Secondary hyperparathyroidism of renal origin: Secondary | ICD-10-CM | POA: Diagnosis not present

## 2022-03-03 DIAGNOSIS — K31811 Angiodysplasia of stomach and duodenum with bleeding: Secondary | ICD-10-CM | POA: Diagnosis not present

## 2022-03-03 DIAGNOSIS — L299 Pruritus, unspecified: Secondary | ICD-10-CM | POA: Diagnosis not present

## 2022-03-03 DIAGNOSIS — D631 Anemia in chronic kidney disease: Secondary | ICD-10-CM | POA: Diagnosis not present

## 2022-03-05 DIAGNOSIS — L299 Pruritus, unspecified: Secondary | ICD-10-CM | POA: Diagnosis not present

## 2022-03-05 DIAGNOSIS — Z992 Dependence on renal dialysis: Secondary | ICD-10-CM | POA: Diagnosis not present

## 2022-03-05 DIAGNOSIS — N2581 Secondary hyperparathyroidism of renal origin: Secondary | ICD-10-CM | POA: Diagnosis not present

## 2022-03-05 DIAGNOSIS — D631 Anemia in chronic kidney disease: Secondary | ICD-10-CM | POA: Diagnosis not present

## 2022-03-05 DIAGNOSIS — N186 End stage renal disease: Secondary | ICD-10-CM | POA: Diagnosis not present

## 2022-03-05 DIAGNOSIS — K31811 Angiodysplasia of stomach and duodenum with bleeding: Secondary | ICD-10-CM | POA: Diagnosis not present

## 2022-03-05 DIAGNOSIS — R52 Pain, unspecified: Secondary | ICD-10-CM | POA: Diagnosis not present

## 2022-03-06 ENCOUNTER — Ambulatory Visit: Payer: Medicare Other | Admitting: Gastroenterology

## 2022-03-06 ENCOUNTER — Telehealth: Payer: Self-pay | Admitting: *Deleted

## 2022-03-06 NOTE — Telephone Encounter (Signed)
Pt rescheduled for EGD for 03/11/22 @ 830.  New instructions reviewed and given to patient.

## 2022-03-06 NOTE — Progress Notes (Signed)
Pt arrived for EGD.  Pt reports that she drank soda and ate a hash brown at 12 noon.  Per Dr. Lyndel Safe will reschedule and give instructions.

## 2022-03-07 DIAGNOSIS — R52 Pain, unspecified: Secondary | ICD-10-CM | POA: Diagnosis not present

## 2022-03-07 DIAGNOSIS — K31811 Angiodysplasia of stomach and duodenum with bleeding: Secondary | ICD-10-CM | POA: Diagnosis not present

## 2022-03-07 DIAGNOSIS — Z992 Dependence on renal dialysis: Secondary | ICD-10-CM | POA: Diagnosis not present

## 2022-03-07 DIAGNOSIS — D631 Anemia in chronic kidney disease: Secondary | ICD-10-CM | POA: Diagnosis not present

## 2022-03-07 DIAGNOSIS — L299 Pruritus, unspecified: Secondary | ICD-10-CM | POA: Diagnosis not present

## 2022-03-07 DIAGNOSIS — N2581 Secondary hyperparathyroidism of renal origin: Secondary | ICD-10-CM | POA: Diagnosis not present

## 2022-03-07 DIAGNOSIS — N186 End stage renal disease: Secondary | ICD-10-CM | POA: Diagnosis not present

## 2022-03-10 DIAGNOSIS — R52 Pain, unspecified: Secondary | ICD-10-CM | POA: Diagnosis not present

## 2022-03-10 DIAGNOSIS — N2581 Secondary hyperparathyroidism of renal origin: Secondary | ICD-10-CM | POA: Diagnosis not present

## 2022-03-10 DIAGNOSIS — K31811 Angiodysplasia of stomach and duodenum with bleeding: Secondary | ICD-10-CM | POA: Diagnosis not present

## 2022-03-10 DIAGNOSIS — N186 End stage renal disease: Secondary | ICD-10-CM | POA: Diagnosis not present

## 2022-03-10 DIAGNOSIS — D631 Anemia in chronic kidney disease: Secondary | ICD-10-CM | POA: Diagnosis not present

## 2022-03-10 DIAGNOSIS — L299 Pruritus, unspecified: Secondary | ICD-10-CM | POA: Diagnosis not present

## 2022-03-10 DIAGNOSIS — Z992 Dependence on renal dialysis: Secondary | ICD-10-CM | POA: Diagnosis not present

## 2022-03-11 ENCOUNTER — Encounter (HOSPITAL_COMMUNITY): Payer: Self-pay | Admitting: Surgery

## 2022-03-11 ENCOUNTER — Telehealth: Payer: Self-pay | Admitting: *Deleted

## 2022-03-11 ENCOUNTER — Encounter: Payer: Medicare Other | Admitting: Gastroenterology

## 2022-03-11 NOTE — Telephone Encounter (Signed)
Pt was notified that she would be placed on a wait list for the hospital and that we will contact her as soon as Dr. Lyndel Safe has availability: Pt verbalized understanding with all questions answered.

## 2022-03-11 NOTE — Telephone Encounter (Signed)
Pt arrived for EGD with Dr. Lyndel Safe.  She receives dialysis MWF and anesthesia was not notified prior to her being scheduled.  Per Lucretia Kern, CRNA and Sol Passer pt will need to be rescheduled at the hospital.  Explained to pt.  She states that she is scheduled to have a parathyroidectomy on 03/18/22.  Advised her that Dr. Steve Rattler nurse will be calling her to get this rescheduled.

## 2022-03-11 NOTE — H&P (Signed)
REFERRING PHYSICIAN: Bud Face  PROVIDER: [facility] Charlotta Newton, MD   Chief Complaint: thyroid and New Consultation (Secondary hyperparathyroidism)  History of Present Illness:  Patient is referred by Dr. Edrick Oh for surgical evaluation and management of secondary hyperparathyroidism. Patient has a history of end-stage renal disease dating back about 15 years. She did have a transplant at [facility]. She has been back on hemodialysis for approximately 3 years. She currently dialyzes at the [facility] kidney center on Mondays, Wednesdays, and Fridays. Her access is in the left upper arm, a fistula. Patient notes chronic fatigue. She complains of bone and joint pain. She denies any skin conditions or areas of ulceration. She denies any sign of heterotopic ossification. She has been on Sensipar but apparently is poorly compliant due to abdominal complaints. Patient has had no prior head or neck surgery. She has no history of thyroid disease. She has never been on thyroid medication.  Recent laboratory studies dated December 2022 show a normal calcium level of 9.4, and elevated intact PTH level of 4356, and an elevated phosphorus level of 7.8.  Review of Systems: A complete review of systems was obtained from the patient. I have reviewed this information and discussed as appropriate with the patient. See HPI as well for other ROS.  Review of Systems  Constitutional: Positive for malaise/fatigue.  HENT: Negative.  Eyes: Negative.  Respiratory: Negative.  Cardiovascular: Negative.  Gastrointestinal: Negative.  Genitourinary: Negative.  Musculoskeletal: Positive for joint pain and myalgias.  Skin: Negative.  Neurological: Negative.  Endo/Heme/Allergies: Negative.  Psychiatric/Behavioral: Negative.    Medical History: Past Medical History:  Diagnosis Date   Anemia   Arthritis   Chronic kidney disease   Hypertension   Thyroid disease    Patient Active Problem List  Diagnosis   Abnormal uterine bleeding   ESRD on dialysis (CMS-HCC)   Acute pyelonephritis   Failed kidney transplant   Fibroids   Acute blood loss anemia   Acute kidney failure, unspecified (CMS-HCC)   Clotted renal dialysis AV graft (CMS-HCC)   Conjunctivitis of left eye   Diarrhea   History of medication noncompliance   Graft rejection, immunologic   Hypertension   Hypertension associated with transplantation   Immunosuppression (CMS-HCC)   Transplanted kidney previously removed due to complication, failure, rejection or infection   Overweight (BMI 25.0-29.9)   Pre-transplant evaluation for kidney transplant   Secondary hyperparathyroidism of renal origin (CMS-HCC)   Thyroid nodule   Past Surgical History:  Procedure Laterality Date   TRANSPLANT KIDNEY Right 2010  [facility]   INSERTION PERITONEAL CANNULA/CATH FOR DIALYSIS 03/2015   NEPHRECTOMY 05/2015  transplant nephrectomy   AMPUTATION Left  as a child   AV fistula Left  forearm   tumor removed from mid brain N/A    Allergies  Allergen Reactions   Penicillins Itching and Rash   Current Outpatient Medications on File Prior to Visit  Medication Sig Dispense Refill   carvediloL (COREG) 12.5 MG tablet Take by mouth   lidocaine-prilocaine (EMLA) cream Apply topically   amLODIPine (NORVASC) 10 MG tablet Take 10 mg by mouth once daily   aspirin 81 MG EC tablet Take 81 mg by mouth once daily   calcitRIOL (ROCALTROL) 0.25 MCG capsule Take 0.25 mcg by mouth once daily   cholecalciferol (VITAMIN D3) 1000 unit tablet Take by mouth   cinacalcet (SENSIPAR) 30 MG tablet 30 mg once daily 3   cloNIDine HCL (CATAPRES) 0.1 MG  tablet Take 0.1 mg by mouth 2 (two) times daily   diphenhydrAMINE (BENADRYL) 25 mg tablet Take 25 mg by mouth once daily   docusate (COLACE) 100 MG capsule Take 100 mg by mouth once daily   ferric citrate 210 mg iron Tab Take 210 mg by mouth once daily as needed   gentamicin  0.1 % cream APPLY SMALL AMOUNT TO PD CATHER EXIT SITE DAILY AFTER CARE.   VELPHORO 500 mg tablet 500 mg once daily   No current facility-administered medications on file prior to visit.   Family History  Problem Relation Age of Onset   High blood pressure (Hypertension) Mother    Social History   Tobacco Use  Smoking Status Every Day   Packs/day: 0.50   Types: Cigarettes   Start date: 10/01/1973  Smokeless Tobacco Never  Tobacco Comments  started smoking age 44 years old ; 1/2 ppd    Social History   Socioeconomic History   Marital status: Single  Occupational History   Occupation: Clinical research associate at [employer]; disability  Tobacco Use   Smoking status: Every Day  Packs/day: 0.50  Types: Cigarettes  Start date: 10/01/1973   Smokeless tobacco: Never   Tobacco comments:  started smoking age 64 years old ; 1/2 ppd  Substance and Sexual Activity   Alcohol use: Never   Drug use: Never  Comment: she smoked marijuana in high school   Sexual activity: Defer  Social History Narrative  Lives alone, daughter and mother live close   Objective:   Vitals:  BP: 118/80  Pulse: 83  Temp: 36.2 C (97.2 F)  SpO2: 94%  Weight: 72.5 kg (159 lb 12.8 oz)  Height: 170.2 cm ('5\' 7"'$ )   Body mass index is 25.03 kg/m.  Physical Exam   GENERAL APPEARANCE Development: normal Nutritional status: normal Gross deformities: none  SKIN Rash, lesions, ulcers: none Induration, erythema: none Nodules: none palpable  EYES Conjunctiva and lids: normal Pupils: equal and reactive Iris: normal bilaterally  EARS, NOSE, MOUTH, THROAT External ears: no lesion or deformity External nose: no lesion or deformity Hearing: grossly normal Due to Covid-19 pandemic, patient is wearing a mask.  NECK Symmetric: yes Trachea: midline Thyroid: Palpation of the right thyroid lobe shows a smooth relatively firm nodule occupying the mid to lower portion of the lobe measuring approximately 3 cm in greatest  dimension, mobile, nontender. Left thyroid lobe is without palpable abnormality. No palpable lymphadenopathy.  CHEST Respiratory effort: normal Retraction or accessory muscle use: no Breath sounds: normal bilaterally Rales, rhonchi, wheeze: none  CARDIOVASCULAR Auscultation: regular rhythm, normal rate Murmurs: none Pulses: radial pulse 2+ palpable Lower extremity edema: none  MUSCULOSKELETAL Station and gait: normal Digits and nails: no clubbing or cyanosis Muscle strength: grossly normal all extremities Range of motion: grossly normal all extremities Deformity: Arteriovenous fistula left upper arm  LYMPHATIC Cervical: none palpable Supraclavicular: none palpable  PSYCHIATRIC Oriented to person, place, and time: yes Mood and affect: normal for situation Judgment and insight: appropriate for situation    Assessment and Plan:   Secondary hyperparathyroidism of renal origin (CMS-HCC)  Thyroid nodule  Patient presents on referral from nephrology for surgical evaluation and recommendations regarding management of secondary hyperparathyroidism of renal origin.  Today we discussed management of secondary hyperparathyroidism with total parathyroidectomy and autotransplantation to the brachial radialis muscle. We discussed the size and location of surgical incisions. We discussed the hospital stay to be anticipated. We discussed potential risk of the surgery including the risk of recurrent laryngeal  nerve injury. The patient understands and wishes to proceed.  However, on physical examination today, the patient appears to have a right thyroid nodule. Prior to any surgical intervention, I would like to further evaluate this finding. We will arrange for an ultrasound examination of the neck. If there is a significant lesion that requires biopsy we will also have that performed at some future date so that we may hopefully successfully deal with both issues at the time of her procedure.  Patient understands and agrees to proceed with further evaluation.  Patient will undergo ultrasound. I will contact her with those results.  The risks and benefits of the procedure have been discussed at length with the patient. The patient understands the proposed procedure, potential alternative treatments, and the course of recovery to be expected. All of the patient's questions have been answered at this time. The patient wishes to proceed with surgery.   Armandina Gemma, MD [facility] [place] practice Office: [phone]

## 2022-03-11 NOTE — Progress Notes (Deleted)
Pt arrived for EGD with Dr. Lyndel Safe.  She receives dialysis MWF and anesthesia was not notified prior to her being scheduled.  Per Lucretia Kern, CRNA and Sol Passer pt will need to be rescheduled at the hospital.  Explained to pt.  She states that she is scheduled to have a parathyroidectomy on 03/18/22.  Advised her that Dr. Steve Rattler nurse will be calling her to get this rescheduled.

## 2022-03-12 DIAGNOSIS — Z992 Dependence on renal dialysis: Secondary | ICD-10-CM | POA: Diagnosis not present

## 2022-03-12 DIAGNOSIS — L299 Pruritus, unspecified: Secondary | ICD-10-CM | POA: Diagnosis not present

## 2022-03-12 DIAGNOSIS — R52 Pain, unspecified: Secondary | ICD-10-CM | POA: Diagnosis not present

## 2022-03-12 DIAGNOSIS — N186 End stage renal disease: Secondary | ICD-10-CM | POA: Diagnosis not present

## 2022-03-12 DIAGNOSIS — K31811 Angiodysplasia of stomach and duodenum with bleeding: Secondary | ICD-10-CM | POA: Diagnosis not present

## 2022-03-12 DIAGNOSIS — D631 Anemia in chronic kidney disease: Secondary | ICD-10-CM | POA: Diagnosis not present

## 2022-03-12 DIAGNOSIS — N2581 Secondary hyperparathyroidism of renal origin: Secondary | ICD-10-CM | POA: Diagnosis not present

## 2022-03-13 DIAGNOSIS — K31811 Angiodysplasia of stomach and duodenum with bleeding: Secondary | ICD-10-CM | POA: Diagnosis not present

## 2022-03-13 DIAGNOSIS — D631 Anemia in chronic kidney disease: Secondary | ICD-10-CM | POA: Diagnosis not present

## 2022-03-13 DIAGNOSIS — N186 End stage renal disease: Secondary | ICD-10-CM | POA: Diagnosis not present

## 2022-03-13 DIAGNOSIS — N2581 Secondary hyperparathyroidism of renal origin: Secondary | ICD-10-CM | POA: Diagnosis not present

## 2022-03-13 DIAGNOSIS — Z992 Dependence on renal dialysis: Secondary | ICD-10-CM | POA: Diagnosis not present

## 2022-03-13 DIAGNOSIS — R52 Pain, unspecified: Secondary | ICD-10-CM | POA: Diagnosis not present

## 2022-03-13 DIAGNOSIS — L299 Pruritus, unspecified: Secondary | ICD-10-CM | POA: Diagnosis not present

## 2022-03-14 DIAGNOSIS — R52 Pain, unspecified: Secondary | ICD-10-CM | POA: Diagnosis not present

## 2022-03-14 DIAGNOSIS — N2581 Secondary hyperparathyroidism of renal origin: Secondary | ICD-10-CM | POA: Diagnosis not present

## 2022-03-14 DIAGNOSIS — D631 Anemia in chronic kidney disease: Secondary | ICD-10-CM | POA: Diagnosis not present

## 2022-03-14 DIAGNOSIS — Z992 Dependence on renal dialysis: Secondary | ICD-10-CM | POA: Diagnosis not present

## 2022-03-14 DIAGNOSIS — L299 Pruritus, unspecified: Secondary | ICD-10-CM | POA: Diagnosis not present

## 2022-03-14 DIAGNOSIS — K31811 Angiodysplasia of stomach and duodenum with bleeding: Secondary | ICD-10-CM | POA: Diagnosis not present

## 2022-03-14 DIAGNOSIS — N186 End stage renal disease: Secondary | ICD-10-CM | POA: Diagnosis not present

## 2022-03-16 ENCOUNTER — Encounter (HOSPITAL_COMMUNITY): Payer: Self-pay | Admitting: Surgery

## 2022-03-17 DIAGNOSIS — Z992 Dependence on renal dialysis: Secondary | ICD-10-CM | POA: Diagnosis not present

## 2022-03-17 DIAGNOSIS — N2581 Secondary hyperparathyroidism of renal origin: Secondary | ICD-10-CM | POA: Diagnosis not present

## 2022-03-17 DIAGNOSIS — R52 Pain, unspecified: Secondary | ICD-10-CM | POA: Diagnosis not present

## 2022-03-17 DIAGNOSIS — N186 End stage renal disease: Secondary | ICD-10-CM | POA: Diagnosis not present

## 2022-03-17 DIAGNOSIS — L299 Pruritus, unspecified: Secondary | ICD-10-CM | POA: Diagnosis not present

## 2022-03-17 DIAGNOSIS — D631 Anemia in chronic kidney disease: Secondary | ICD-10-CM | POA: Diagnosis not present

## 2022-03-17 DIAGNOSIS — K31811 Angiodysplasia of stomach and duodenum with bleeding: Secondary | ICD-10-CM | POA: Diagnosis not present

## 2022-03-18 ENCOUNTER — Inpatient Hospital Stay (HOSPITAL_BASED_OUTPATIENT_CLINIC_OR_DEPARTMENT_OTHER): Payer: Medicare Other | Admitting: Certified Registered Nurse Anesthetist

## 2022-03-18 ENCOUNTER — Inpatient Hospital Stay (HOSPITAL_COMMUNITY)
Admission: RE | Admit: 2022-03-18 | Discharge: 2022-03-20 | DRG: 674 | Discharge status: Against Medical Advice | Payer: Medicare Other | Attending: Surgery | Admitting: Surgery

## 2022-03-18 ENCOUNTER — Other Ambulatory Visit: Payer: Self-pay

## 2022-03-18 ENCOUNTER — Inpatient Hospital Stay (HOSPITAL_COMMUNITY): Payer: Medicare Other | Admitting: Certified Registered Nurse Anesthetist

## 2022-03-18 ENCOUNTER — Encounter (HOSPITAL_COMMUNITY): Payer: Self-pay | Admitting: Surgery

## 2022-03-18 ENCOUNTER — Encounter (HOSPITAL_COMMUNITY)
Admission: RE | Discharge status: Against Medical Advice | Payer: Self-pay | Source: Home / Self Care | Attending: Surgery

## 2022-03-18 DIAGNOSIS — Z841 Family history of disorders of kidney and ureter: Secondary | ICD-10-CM

## 2022-03-18 DIAGNOSIS — Z91148 Patient's other noncompliance with medication regimen for other reason: Secondary | ICD-10-CM | POA: Diagnosis not present

## 2022-03-18 DIAGNOSIS — J440 Chronic obstructive pulmonary disease with acute lower respiratory infection: Secondary | ICD-10-CM | POA: Diagnosis not present

## 2022-03-18 DIAGNOSIS — Z87440 Personal history of urinary (tract) infections: Secondary | ICD-10-CM

## 2022-03-18 DIAGNOSIS — T8619 Other complication of kidney transplant: Secondary | ICD-10-CM | POA: Diagnosis not present

## 2022-03-18 DIAGNOSIS — M1990 Unspecified osteoarthritis, unspecified site: Secondary | ICD-10-CM | POA: Diagnosis present

## 2022-03-18 DIAGNOSIS — E890 Postprocedural hypothyroidism: Secondary | ICD-10-CM | POA: Diagnosis not present

## 2022-03-18 DIAGNOSIS — R109 Unspecified abdominal pain: Secondary | ICD-10-CM | POA: Diagnosis not present

## 2022-03-18 DIAGNOSIS — Z83438 Family history of other disorder of lipoprotein metabolism and other lipidemia: Secondary | ICD-10-CM | POA: Diagnosis not present

## 2022-03-18 DIAGNOSIS — F17210 Nicotine dependence, cigarettes, uncomplicated: Secondary | ICD-10-CM

## 2022-03-18 DIAGNOSIS — K219 Gastro-esophageal reflux disease without esophagitis: Secondary | ICD-10-CM | POA: Diagnosis not present

## 2022-03-18 DIAGNOSIS — E860 Dehydration: Secondary | ICD-10-CM | POA: Diagnosis not present

## 2022-03-18 DIAGNOSIS — I120 Hypertensive chronic kidney disease with stage 5 chronic kidney disease or end stage renal disease: Secondary | ICD-10-CM

## 2022-03-18 DIAGNOSIS — N186 End stage renal disease: Secondary | ICD-10-CM

## 2022-03-18 DIAGNOSIS — T8612 Kidney transplant failure: Secondary | ICD-10-CM | POA: Diagnosis present

## 2022-03-18 DIAGNOSIS — E8889 Other specified metabolic disorders: Secondary | ICD-10-CM | POA: Diagnosis present

## 2022-03-18 DIAGNOSIS — Z79899 Other long term (current) drug therapy: Secondary | ICD-10-CM | POA: Diagnosis not present

## 2022-03-18 DIAGNOSIS — R404 Transient alteration of awareness: Secondary | ICD-10-CM | POA: Diagnosis not present

## 2022-03-18 DIAGNOSIS — Z992 Dependence on renal dialysis: Secondary | ICD-10-CM

## 2022-03-18 DIAGNOSIS — Z7982 Long term (current) use of aspirin: Secondary | ICD-10-CM | POA: Diagnosis not present

## 2022-03-18 DIAGNOSIS — Z880 Allergy status to penicillin: Secondary | ICD-10-CM

## 2022-03-18 DIAGNOSIS — D351 Benign neoplasm of parathyroid gland: Secondary | ICD-10-CM | POA: Diagnosis not present

## 2022-03-18 DIAGNOSIS — E210 Primary hyperparathyroidism: Secondary | ICD-10-CM | POA: Diagnosis not present

## 2022-03-18 DIAGNOSIS — D631 Anemia in chronic kidney disease: Secondary | ICD-10-CM

## 2022-03-18 DIAGNOSIS — I10 Essential (primary) hypertension: Secondary | ICD-10-CM | POA: Diagnosis not present

## 2022-03-18 DIAGNOSIS — J189 Pneumonia, unspecified organism: Secondary | ICD-10-CM | POA: Diagnosis not present

## 2022-03-18 DIAGNOSIS — Z8249 Family history of ischemic heart disease and other diseases of the circulatory system: Secondary | ICD-10-CM | POA: Diagnosis not present

## 2022-03-18 DIAGNOSIS — K259 Gastric ulcer, unspecified as acute or chronic, without hemorrhage or perforation: Secondary | ICD-10-CM | POA: Diagnosis not present

## 2022-03-18 DIAGNOSIS — R6889 Other general symptoms and signs: Secondary | ICD-10-CM | POA: Diagnosis not present

## 2022-03-18 DIAGNOSIS — N250 Renal osteodystrophy: Secondary | ICD-10-CM | POA: Diagnosis present

## 2022-03-18 DIAGNOSIS — E213 Hyperparathyroidism, unspecified: Secondary | ICD-10-CM | POA: Diagnosis present

## 2022-03-18 DIAGNOSIS — L309 Dermatitis, unspecified: Secondary | ICD-10-CM | POA: Diagnosis present

## 2022-03-18 DIAGNOSIS — R5382 Chronic fatigue, unspecified: Secondary | ICD-10-CM | POA: Diagnosis present

## 2022-03-18 DIAGNOSIS — Z86011 Personal history of benign neoplasm of the brain: Secondary | ICD-10-CM | POA: Diagnosis not present

## 2022-03-18 DIAGNOSIS — F32A Depression, unspecified: Secondary | ICD-10-CM | POA: Diagnosis not present

## 2022-03-18 DIAGNOSIS — T829XXA Unspecified complication of cardiac and vascular prosthetic device, implant and graft, initial encounter: Secondary | ICD-10-CM | POA: Diagnosis present

## 2022-03-18 DIAGNOSIS — E8779 Other fluid overload: Secondary | ICD-10-CM | POA: Diagnosis not present

## 2022-03-18 DIAGNOSIS — Y830 Surgical operation with transplant of whole organ as the cause of abnormal reaction of the patient, or of later complication, without mention of misadventure at the time of the procedure: Secondary | ICD-10-CM | POA: Diagnosis present

## 2022-03-18 DIAGNOSIS — Z833 Family history of diabetes mellitus: Secondary | ICD-10-CM | POA: Diagnosis not present

## 2022-03-18 DIAGNOSIS — N2581 Secondary hyperparathyroidism of renal origin: Principal | ICD-10-CM | POA: Diagnosis present

## 2022-03-18 DIAGNOSIS — R000 Tachycardia, unspecified: Secondary | ICD-10-CM | POA: Diagnosis not present

## 2022-03-18 DIAGNOSIS — Z905 Acquired absence of kidney: Secondary | ICD-10-CM

## 2022-03-18 DIAGNOSIS — Z940 Kidney transplant status: Secondary | ICD-10-CM

## 2022-03-18 DIAGNOSIS — R112 Nausea with vomiting, unspecified: Secondary | ICD-10-CM | POA: Diagnosis not present

## 2022-03-18 DIAGNOSIS — R9389 Abnormal findings on diagnostic imaging of other specified body structures: Secondary | ICD-10-CM | POA: Diagnosis not present

## 2022-03-18 DIAGNOSIS — E892 Postprocedural hypoparathyroidism: Secondary | ICD-10-CM | POA: Diagnosis not present

## 2022-03-18 DIAGNOSIS — Z743 Need for continuous supervision: Secondary | ICD-10-CM | POA: Diagnosis not present

## 2022-03-18 DIAGNOSIS — R1011 Right upper quadrant pain: Secondary | ICD-10-CM | POA: Diagnosis not present

## 2022-03-18 HISTORY — PX: PARATHYROIDECTOMY: SHX19

## 2022-03-18 LAB — POCT I-STAT, CHEM 8
BUN: 31 mg/dL — ABNORMAL HIGH (ref 6–20)
Calcium, Ion: 1.06 mmol/L — ABNORMAL LOW (ref 1.15–1.40)
Chloride: 95 mmol/L — ABNORMAL LOW (ref 98–111)
Creatinine, Ser: 7.8 mg/dL — ABNORMAL HIGH (ref 0.44–1.00)
Glucose, Bld: 78 mg/dL (ref 70–99)
HCT: 30 % — ABNORMAL LOW (ref 36.0–46.0)
Hemoglobin: 10.2 g/dL — ABNORMAL LOW (ref 12.0–15.0)
Potassium: 3.8 mmol/L (ref 3.5–5.1)
Sodium: 139 mmol/L (ref 135–145)
TCO2: 31 mmol/L (ref 22–32)

## 2022-03-18 LAB — HEPATITIS B SURFACE ANTIGEN: Hepatitis B Surface Ag: NONREACTIVE

## 2022-03-18 LAB — HEPATITIS C ANTIBODY: HCV Ab: NONREACTIVE

## 2022-03-18 LAB — HEPATITIS B SURFACE ANTIBODY,QUALITATIVE: Hep B S Ab: REACTIVE — AB

## 2022-03-18 LAB — HEPATITIS B CORE ANTIBODY, TOTAL: Hep B Core Total Ab: NONREACTIVE

## 2022-03-18 SURGERY — PARATHYROIDECTOMY
Anesthesia: General | Site: Neck | Laterality: Right

## 2022-03-18 MED ORDER — ORAL CARE MOUTH RINSE
15.0000 mL | Freq: Once | OROMUCOSAL | Status: AC
Start: 2022-03-18 — End: 2022-03-18

## 2022-03-18 MED ORDER — LABETALOL HCL 5 MG/ML IV SOLN: 10.0000 mg | INTRAVENOUS | Status: DC | PRN

## 2022-03-18 MED ORDER — ALTEPLASE 2 MG IJ SOLR: 2.0000 mg | Freq: Once | INTRAMUSCULAR | Status: DC | PRN

## 2022-03-18 MED ORDER — PROMETHAZINE HCL 25 MG/ML IJ SOLN: 6.2500 mg | INTRAMUSCULAR | Status: DC | PRN

## 2022-03-18 MED ORDER — CARVEDILOL 25 MG PO TABS
25.0000 mg | ORAL_TABLET | Freq: Two times a day (BID) | ORAL | Status: DC
  Filled 2022-03-18 (×3): qty 1

## 2022-03-18 MED ORDER — SODIUM CHLORIDE 0.9 % IV SOLN: INTRAVENOUS | Status: DC

## 2022-03-18 MED ORDER — CALCIUM GLUCONATE-NACL 2-0.675 GM/100ML-% IV SOLN
2.0000 g | Freq: Once | INTRAVENOUS | Status: AC
Start: 2022-03-18 — End: 2022-03-18
  Administered 2022-03-18: 2000 mg via INTRAVENOUS
  Filled 2022-03-18: qty 100

## 2022-03-18 MED ORDER — FENTANYL CITRATE (PF) 250 MCG/5ML IJ SOLN
INTRAMUSCULAR | Status: DC | PRN
Start: 2022-03-18 — End: 2022-03-18
  Administered 2022-03-18: 50 ug via INTRAVENOUS
  Administered 2022-03-18: 100 ug via INTRAVENOUS
  Administered 2022-03-18 (×2): 50 ug via INTRAVENOUS

## 2022-03-18 MED ORDER — FENTANYL CITRATE (PF) 250 MCG/5ML IJ SOLN
INTRAMUSCULAR | Status: AC
  Filled 2022-03-18: qty 5

## 2022-03-18 MED ORDER — BUPIVACAINE HCL (PF) 0.25 % IJ SOLN
INTRAMUSCULAR | Status: DC | PRN
  Administered 2022-03-18: 10 mL
  Administered 2022-03-18: 7 mL

## 2022-03-18 MED ORDER — CELECOXIB 200 MG PO CAPS
200.0000 mg | ORAL_CAPSULE | Freq: Once | ORAL | Status: AC
  Administered 2022-03-18: 200 mg via ORAL
  Filled 2022-03-18: qty 1

## 2022-03-18 MED ORDER — ONDANSETRON 4 MG PO TBDP: 4.0000 mg | ORAL_TABLET | Freq: Four times a day (QID) | ORAL | Status: DC | PRN

## 2022-03-18 MED ORDER — HYDROMORPHONE HCL 1 MG/ML IJ SOLN
1.0000 mg | INTRAMUSCULAR | Status: DC | PRN
  Administered 2022-03-18 – 2022-03-19 (×3): 1 mg via INTRAVENOUS
  Filled 2022-03-18 (×3): qty 1

## 2022-03-18 MED ORDER — FENTANYL CITRATE (PF) 100 MCG/2ML IJ SOLN
INTRAMUSCULAR | Status: AC
  Filled 2022-03-18: qty 2

## 2022-03-18 MED ORDER — ONDANSETRON HCL 4 MG/2ML IJ SOLN: 4.0000 mg | Freq: Four times a day (QID) | INTRAMUSCULAR | Status: DC | PRN

## 2022-03-18 MED ORDER — HYDRALAZINE HCL 20 MG/ML IJ SOLN
INTRAMUSCULAR | Status: AC
  Filled 2022-03-18: qty 1

## 2022-03-18 MED ORDER — PANTOPRAZOLE SODIUM 40 MG PO TBEC
40.0000 mg | DELAYED_RELEASE_TABLET | Freq: Two times a day (BID) | ORAL | Status: DC
  Filled 2022-03-18: qty 1

## 2022-03-18 MED ORDER — LABETALOL HCL 5 MG/ML IV SOLN
INTRAVENOUS | Status: AC
  Filled 2022-03-18: qty 4

## 2022-03-18 MED ORDER — CIPROFLOXACIN IN D5W 400 MG/200ML IV SOLN
INTRAVENOUS | Status: AC
  Filled 2022-03-18: qty 200

## 2022-03-18 MED ORDER — SUGAMMADEX SODIUM 200 MG/2ML IV SOLN
INTRAVENOUS | Status: DC | PRN
  Administered 2022-03-18: 150 mg via INTRAVENOUS

## 2022-03-18 MED ORDER — PENTAFLUOROPROP-TETRAFLUOROETH EX AERO: 1.0000 | INHALATION_SPRAY | CUTANEOUS | Status: DC | PRN

## 2022-03-18 MED ORDER — LIDOCAINE 2% (20 MG/ML) 5 ML SYRINGE
INTRAMUSCULAR | Status: DC | PRN
  Administered 2022-03-18: 80 mg via INTRAVENOUS

## 2022-03-18 MED ORDER — ALBUMIN HUMAN 5 % IV SOLN: INTRAVENOUS | Status: DC | PRN

## 2022-03-18 MED ORDER — MIDAZOLAM HCL 2 MG/2ML IJ SOLN
INTRAMUSCULAR | Status: AC
  Filled 2022-03-18: qty 2

## 2022-03-18 MED ORDER — DIPHENHYDRAMINE HCL 50 MG/ML IJ SOLN
25.0000 mg | Freq: Four times a day (QID) | INTRAMUSCULAR | Status: DC | PRN
  Administered 2022-03-18: 25 mg via INTRAVENOUS
  Filled 2022-03-18: qty 1

## 2022-03-18 MED ORDER — PROPOFOL 10 MG/ML IV BOLUS
INTRAVENOUS | Status: DC | PRN
  Administered 2022-03-18: 30 mg via INTRAVENOUS
  Administered 2022-03-18: 120 mg via INTRAVENOUS

## 2022-03-18 MED ORDER — CHLORHEXIDINE GLUCONATE CLOTH 2 % EX PADS: 6.0000 | MEDICATED_PAD | Freq: Once | CUTANEOUS | Status: DC

## 2022-03-18 MED ORDER — AMLODIPINE BESYLATE 10 MG PO TABS
10.0000 mg | ORAL_TABLET | Freq: Every day | ORAL | Status: DC
  Filled 2022-03-18 (×2): qty 1

## 2022-03-18 MED ORDER — ROCURONIUM BROMIDE 10 MG/ML (PF) SYRINGE
PREFILLED_SYRINGE | INTRAVENOUS | Status: DC | PRN
  Administered 2022-03-18: 10 mg via INTRAVENOUS
  Administered 2022-03-18: 70 mg via INTRAVENOUS

## 2022-03-18 MED ORDER — ACETAMINOPHEN 650 MG RE SUPP: 650.0000 mg | Freq: Four times a day (QID) | RECTAL | Status: DC | PRN

## 2022-03-18 MED ORDER — ONDANSETRON HCL 4 MG/2ML IJ SOLN
INTRAMUSCULAR | Status: AC
Start: 2022-03-18 — End: ?
  Filled 2022-03-18: qty 2

## 2022-03-18 MED ORDER — PHENYLEPHRINE HCL-NACL 20-0.9 MG/250ML-% IV SOLN
INTRAVENOUS | Status: DC | PRN
  Administered 2022-03-18: 80 ug/min via INTRAVENOUS

## 2022-03-18 MED ORDER — FENTANYL CITRATE (PF) 100 MCG/2ML IJ SOLN
INTRAMUSCULAR | Status: AC
  Administered 2022-03-18: 25 ug via INTRAVENOUS
  Filled 2022-03-18: qty 2

## 2022-03-18 MED ORDER — PROPOFOL 10 MG/ML IV BOLUS
INTRAVENOUS | Status: AC
  Filled 2022-03-18: qty 20

## 2022-03-18 MED ORDER — MIDAZOLAM HCL 2 MG/2ML IJ SOLN
INTRAMUSCULAR | Status: DC | PRN
  Administered 2022-03-18: 2 mg via INTRAVENOUS

## 2022-03-18 MED ORDER — DIPHENHYDRAMINE HCL 50 MG/ML IJ SOLN
INTRAMUSCULAR | Status: AC
  Filled 2022-03-18: qty 1

## 2022-03-18 MED ORDER — SERTRALINE HCL 50 MG PO TABS
50.0000 mg | ORAL_TABLET | Freq: Every day | ORAL | Status: DC
  Filled 2022-03-18: qty 1

## 2022-03-18 MED ORDER — POLYVINYL ALCOHOL 1.4 % OP SOLN: 1.0000 [drp] | OPHTHALMIC | Status: DC | PRN

## 2022-03-18 MED ORDER — DEXAMETHASONE SODIUM PHOSPHATE 10 MG/ML IJ SOLN
INTRAMUSCULAR | Status: AC
Start: 2022-03-18 — End: ?
  Filled 2022-03-18: qty 1

## 2022-03-18 MED ORDER — ONDANSETRON HCL 4 MG/2ML IJ SOLN
INTRAMUSCULAR | Status: DC | PRN
  Administered 2022-03-18: 4 mg via INTRAVENOUS

## 2022-03-18 MED ORDER — ANTICOAGULANT SODIUM CITRATE 4% (200MG/5ML) IV SOLN: 5.0000 mL | Status: DC | PRN

## 2022-03-18 MED ORDER — CHLORHEXIDINE GLUCONATE 0.12 % MT SOLN
OROMUCOSAL | Status: AC
  Administered 2022-03-18: 15 mL via OROMUCOSAL
  Filled 2022-03-18: qty 15

## 2022-03-18 MED ORDER — DARBEPOETIN ALFA 200 MCG/0.4ML IJ SOSY
200.0000 ug | PREFILLED_SYRINGE | INTRAMUSCULAR | Status: DC
  Administered 2022-03-19: 200 ug via INTRAVENOUS
  Filled 2022-03-18 (×2): qty 0.4

## 2022-03-18 MED ORDER — CIPROFLOXACIN IN D5W 400 MG/200ML IV SOLN
400.0000 mg | INTRAVENOUS | Status: AC
  Administered 2022-03-18: 400 mg via INTRAVENOUS

## 2022-03-18 MED ORDER — LIDOCAINE-PRILOCAINE 2.5-2.5 % EX CREA: 1.0000 | TOPICAL_CREAM | CUTANEOUS | Status: DC | PRN

## 2022-03-18 MED ORDER — PHENYLEPHRINE 80 MCG/ML (10ML) SYRINGE FOR IV PUSH (FOR BLOOD PRESSURE SUPPORT)
PREFILLED_SYRINGE | INTRAVENOUS | Status: DC | PRN
  Administered 2022-03-18: 160 ug via INTRAVENOUS
  Administered 2022-03-18: 80 ug via INTRAVENOUS

## 2022-03-18 MED ORDER — FENTANYL CITRATE (PF) 100 MCG/2ML IJ SOLN
25.0000 ug | INTRAMUSCULAR | Status: DC | PRN
  Administered 2022-03-18: 25 ug via INTRAVENOUS
  Administered 2022-03-18: 50 ug via INTRAVENOUS

## 2022-03-18 MED ORDER — LIDOCAINE 2% (20 MG/ML) 5 ML SYRINGE
INTRAMUSCULAR | Status: AC
Start: 2022-03-18 — End: ?
  Filled 2022-03-18: qty 5

## 2022-03-18 MED ORDER — OXYCODONE HCL 5 MG PO TABS: 5.0000 mg | ORAL_TABLET | ORAL | Status: DC | PRN

## 2022-03-18 MED ORDER — LABETALOL HCL 5 MG/ML IV SOLN
5.0000 mg | INTRAVENOUS | Status: DC | PRN
  Administered 2022-03-18 (×3): 5 mg via INTRAVENOUS

## 2022-03-18 MED ORDER — BUPIVACAINE HCL (PF) 0.25 % IJ SOLN
INTRAMUSCULAR | Status: AC
  Filled 2022-03-18: qty 30

## 2022-03-18 MED ORDER — TRAMADOL HCL 50 MG PO TABS: 50.0000 mg | ORAL_TABLET | Freq: Four times a day (QID) | ORAL | Status: DC | PRN

## 2022-03-18 MED ORDER — 0.9 % SODIUM CHLORIDE (POUR BTL) OPTIME
TOPICAL | Status: DC | PRN
  Administered 2022-03-18: 1000 mL

## 2022-03-18 MED ORDER — CALCITRIOL 0.5 MCG PO CAPS
1.0000 ug | ORAL_CAPSULE | Freq: Every day | ORAL | Status: DC
  Filled 2022-03-18 (×2): qty 2

## 2022-03-18 MED ORDER — CHLORHEXIDINE GLUCONATE CLOTH 2 % EX PADS
6.0000 | MEDICATED_PAD | Freq: Every day | CUTANEOUS | Status: DC
  Administered 2022-03-19 – 2022-03-20 (×2): 6 via TOPICAL

## 2022-03-18 MED ORDER — POLYETHYLENE GLYCOL 400 0.25 % OP SOLN: Freq: Every day | OPHTHALMIC | Status: DC | PRN

## 2022-03-18 MED ORDER — CHLORHEXIDINE GLUCONATE 0.12 % MT SOLN: 15.0000 mL | Freq: Once | OROMUCOSAL | Status: AC

## 2022-03-18 MED ORDER — ACETAMINOPHEN 325 MG PO TABS
650.0000 mg | ORAL_TABLET | Freq: Four times a day (QID) | ORAL | Status: DC | PRN
  Administered 2022-03-19: 650 mg via ORAL
  Filled 2022-03-18: qty 2

## 2022-03-18 MED ORDER — HEMOSTATIC AGENTS (NO CHARGE) OPTIME
TOPICAL | Status: DC | PRN
  Administered 2022-03-18: 1 via TOPICAL

## 2022-03-18 MED ORDER — ACETAMINOPHEN 500 MG PO TABS
1000.0000 mg | ORAL_TABLET | Freq: Once | ORAL | Status: AC
  Administered 2022-03-18: 1000 mg via ORAL
  Filled 2022-03-18: qty 2

## 2022-03-18 MED ORDER — LIDOCAINE HCL (PF) 1 % IJ SOLN: 5.0000 mL | INTRAMUSCULAR | Status: DC | PRN

## 2022-03-18 MED ORDER — AMISULPRIDE (ANTIEMETIC) 5 MG/2ML IV SOLN: 10.0000 mg | Freq: Once | INTRAVENOUS | Status: DC | PRN

## 2022-03-18 MED ORDER — HYDRALAZINE HCL 20 MG/ML IJ SOLN
10.0000 mg | Freq: Once | INTRAMUSCULAR | Status: AC
  Administered 2022-03-18: 10 mg via INTRAVENOUS

## 2022-03-18 MED ORDER — ROCURONIUM BROMIDE 10 MG/ML (PF) SYRINGE
PREFILLED_SYRINGE | INTRAVENOUS | Status: AC
  Filled 2022-03-18: qty 10

## 2022-03-18 MED ORDER — DIPHENHYDRAMINE HCL 25 MG PO CAPS
25.0000 mg | ORAL_CAPSULE | Freq: Every evening | ORAL | Status: DC | PRN
  Administered 2022-03-18: 25 mg via ORAL
  Filled 2022-03-18: qty 1

## 2022-03-18 MED ORDER — HEPARIN SODIUM (PORCINE) 1000 UNIT/ML DIALYSIS
1000.0000 [IU] | INTRAMUSCULAR | Status: DC | PRN
  Filled 2022-03-18: qty 1

## 2022-03-18 SURGICAL SUPPLY — 60 items
ADH SKN CLS APL DERMABOND .7 (GAUZE/BANDAGES/DRESSINGS) ×4
APL PRP STRL LF DISP 70% ISPRP (MISCELLANEOUS) ×2
ATTRACTOMAT 16X20 MAGNETIC DRP (DRAPES) ×3 IMPLANT
BAG COUNTER SPONGE SURGICOUNT (BAG) ×3 IMPLANT
BAG SPNG CNTER NS LX DISP (BAG) ×2
BLADE SURG 15 STRL LF DISP TIS (BLADE) ×2 IMPLANT
BLADE SURG 15 STRL SS (BLADE) ×6
CANISTER SUCT 3000ML PPV (MISCELLANEOUS) ×3 IMPLANT
CHLORAPREP W/TINT 26 (MISCELLANEOUS) ×3 IMPLANT
CLIP TI MEDIUM 6 (CLIP) ×1 IMPLANT
CLIP TI WIDE RED SMALL 6 (CLIP) ×1 IMPLANT
CLIP VESOCCLUDE MED 6/CT (CLIP) ×3 IMPLANT
CLIP VESOCCLUDE SM WIDE 6/CT (CLIP) ×3 IMPLANT
CNTNR URN SCR LID CUP LEK RST (MISCELLANEOUS) ×2 IMPLANT
CONT SPEC 4OZ STRL OR WHT (MISCELLANEOUS) ×3
CONTAINER PROTECT SURGISLUSH (MISCELLANEOUS) ×1 IMPLANT
COVER SURGICAL LIGHT HANDLE (MISCELLANEOUS) ×3 IMPLANT
DERMABOND ADVANCED (GAUZE/BANDAGES/DRESSINGS) ×2
DERMABOND ADVANCED .7 DNX12 (GAUZE/BANDAGES/DRESSINGS) IMPLANT
DRAPE LAPAROTOMY 100X72 PEDS (DRAPES) ×3 IMPLANT
DRAPE ORTHO SPLIT 77X108 STRL (DRAPES) ×6
DRAPE SURG ORHT 6 SPLT 77X108 (DRAPES) IMPLANT
DRAPE UTILITY XL STRL (DRAPES) ×3 IMPLANT
ELECT CAUTERY BLADE 6.4 (BLADE) ×3 IMPLANT
ELECT REM PT RETURN 9FT ADLT (ELECTROSURGICAL) ×3
ELECTRODE REM PT RTRN 9FT ADLT (ELECTROSURGICAL) ×2 IMPLANT
GAUZE 4X4 16PLY ~~LOC~~+RFID DBL (SPONGE) ×4 IMPLANT
GAUZE SPONGE 2X2 8PLY STRL LF (GAUZE/BANDAGES/DRESSINGS) ×2 IMPLANT
GLOVE SURG ORTHO 8.0 STRL STRW (GLOVE) ×3 IMPLANT
GOWN STRL REUS W/ TWL LRG LVL3 (GOWN DISPOSABLE) ×2 IMPLANT
GOWN STRL REUS W/ TWL XL LVL3 (GOWN DISPOSABLE) ×2 IMPLANT
GOWN STRL REUS W/TWL LRG LVL3 (GOWN DISPOSABLE) ×3
GOWN STRL REUS W/TWL XL LVL3 (GOWN DISPOSABLE) ×3
HEMOSTAT ARISTA ABSORB 3G PWDR (HEMOSTASIS) IMPLANT
HEMOSTAT SURGICEL 2X4 FIBR (HEMOSTASIS) ×3 IMPLANT
ILLUMINATOR WAVEGUIDE N/F (MISCELLANEOUS) ×3 IMPLANT
KIT BASIN OR (CUSTOM PROCEDURE TRAY) ×3 IMPLANT
KIT TURNOVER KIT B (KITS) ×3 IMPLANT
NDL HYPO 25GX1X1/2 BEV (NEEDLE) ×2 IMPLANT
NEEDLE HYPO 25GX1X1/2 BEV (NEEDLE) ×3 IMPLANT
NS IRRIG 1000ML POUR BTL (IV SOLUTION) ×3 IMPLANT
PACK SURGICAL SETUP 50X90 (CUSTOM PROCEDURE TRAY) ×3 IMPLANT
PAD ARMBOARD 7.5X6 YLW CONV (MISCELLANEOUS) ×3 IMPLANT
PENCIL BUTTON HOLSTER BLD 10FT (ELECTRODE) ×3 IMPLANT
POSITIONER HEAD DONUT 9IN (MISCELLANEOUS) ×3 IMPLANT
SPONGE GAUZE 2X2 STER 10/PKG (GAUZE/BANDAGES/DRESSINGS) ×1
SPONGE INTESTINAL PEANUT (DISPOSABLE) ×1 IMPLANT
STRIP CLOSURE SKIN 1/2X4 (GAUZE/BANDAGES/DRESSINGS) ×3 IMPLANT
SUT MNCRL AB 4-0 PS2 18 (SUTURE) ×4 IMPLANT
SUT PROLENE 4 0 SH DA (SUTURE) ×1 IMPLANT
SUT SILK 2 0 (SUTURE) ×3
SUT SILK 2-0 18XBRD TIE 12 (SUTURE) IMPLANT
SUT SILK 3 0 (SUTURE) ×3
SUT SILK 3-0 18XBRD TIE 12 (SUTURE) IMPLANT
SUT VIC AB 3-0 SH 18 (SUTURE) ×4 IMPLANT
SYR BULB EAR ULCER 3OZ GRN STR (SYRINGE) ×3 IMPLANT
SYR CONTROL 10ML LL (SYRINGE) ×3 IMPLANT
TOWEL GREEN STERILE (TOWEL DISPOSABLE) ×3 IMPLANT
TOWEL GREEN STERILE FF (TOWEL DISPOSABLE) ×3 IMPLANT
TUBE CONNECTING 12X1/4 (SUCTIONS) ×3 IMPLANT

## 2022-03-18 NOTE — Anesthesia Procedure Notes (Signed)
Procedure Name: Intubation Date/Time: 03/18/2022 1:31 PM  Performed by: Michele Rockers, CRNAPre-anesthesia Checklist: Patient identified, Patient being monitored, Timeout performed, Emergency Drugs available and Suction available Patient Re-evaluated:Patient Re-evaluated prior to induction Oxygen Delivery Method: Circle System Utilized Preoxygenation: Pre-oxygenation with 100% oxygen Induction Type: IV induction Ventilation: Mask ventilation without difficulty Laryngoscope Size: Miller and 2 Grade View: Grade I Tube type: Oral Tube size: 7.0 mm Number of attempts: 1 Airway Equipment and Method: Stylet Placement Confirmation: ETT inserted through vocal cords under direct vision, positive ETCO2 and breath sounds checked- equal and bilateral Secured at: 21 cm Tube secured with: Tape Dental Injury: Teeth and Oropharynx as per pre-operative assessment

## 2022-03-18 NOTE — Anesthesia Postprocedure Evaluation (Signed)
Anesthesia Post Note  Patient: Ruth Gutierrez  Procedure(s) Performed: TOTAL PARATHYROIDECTOMY (Neck) PARATHYROID AUTOTRANSPLANT RIGHT FOREARM (Right: Arm Lower)     Patient location during evaluation: PACU Anesthesia Type: General Level of consciousness: awake and alert and oriented Pain management: pain level controlled Vital Signs Assessment: post-procedure vital signs reviewed and stable Respiratory status: spontaneous breathing, nonlabored ventilation and respiratory function stable Cardiovascular status: blood pressure returned to baseline and stable Postop Assessment: no apparent nausea or vomiting Anesthetic complications: no   No notable events documented.  Last Vitals:  Vitals:   03/18/22 1715 03/18/22 1730  BP: (!) 156/78 (!) 174/83  Pulse: 77 75  Resp: 12 13  Temp:    SpO2: 93% 95%    Last Pain:  Vitals:   03/18/22 1730  TempSrc:   PainSc: 4                  Meryl Hubers A.

## 2022-03-18 NOTE — Op Note (Signed)
Operative Note  Pre-operative Diagnosis:  Secondary hyperparathyroidism of renal origin  Post-operative Diagnosis:  same  Surgeon:  Armandina Gemma, MD  Assistant:  Pryor Curia, RNFA   Procedure:  Total parathyroidectomy with autotransplantation to right brachioradialis muscle  Anesthesia:  general  Estimated Blood Loss:  20 cc  Drains: none         Specimen: to pathology; frozen section x 5  Indications:  Patient is referred by Dr. Edrick Oh for surgical evaluation and management of secondary hyperparathyroidism. Patient has a history of end-stage renal disease dating back about 15 years. She did have a transplant at [facility]. She has been back on hemodialysis for approximately 3 years. She currently dialyzes at the [facility] kidney center on Mondays, Wednesdays, and Fridays. Her access is in the left upper arm, a fistula. Patient notes chronic fatigue. She complains of bone and joint pain. She denies any skin conditions or areas of ulceration. She denies any sign of heterotopic ossification. She has been on Sensipar but apparently is poorly compliant due to abdominal complaints. Patient has had no prior head or neck surgery. She has no history of thyroid disease. She has never been on thyroid medication. Recent laboratory studies dated December 2022 show a normal calcium level of 9.4, and elevated intact PTH level of 4356, and an elevated phosphorus level of 7.8.  Patient now comes to surgery for total parathyroidectomy with autotransplantation.  Procedure:  The patient was seen in the pre-op holding area. The risks, benefits, complications, treatment options, and expected outcomes were previously discussed with the patient. The patient agreed with the proposed plan and has signed the informed consent form.  The patient was brought to the operating room by the surgical team, identified as LASHELLE KOY and the procedure verified. A "time out" was  completed and the above information confirmed.  Following induction of general anesthesia, the patient was positioned and then prepped and draped in the usual aseptic fashion.  After ascertaining that an adequate level of anesthesia been achieved, a Kocher incision was made with a #15 blade.  Dissection was carried through the subcutaneous tissues.  Hemostasis is achieved with the electrocautery.  Platysma is divided with the electrocautery.  Subplatysmal flaps are developed cephalad caudad from the thyroid notch to the sternal notch.  A Mahorner self-retaining retractor is placed for exposure.  Dissection is begun on the left side.  Strap muscles are reflected laterally exposing a relatively normal-appearing left thyroid lobe.  Lobe is mobilized anteriorly.  Venous tributaries are divided between ligaclips with the harmonic scalpel.  Dissection was carried posteriorly and superiorly.  An enlarged heterogeneous appearing parathyroid gland is identified.  It is gently dissected out.  Vascular structures are divided between ligaclips with the harmonic scalpel.  The entire gland is excised.  A biopsy of the gland is taken and submitted to pathology where frozen section confirms hyperplastic parathyroid tissue.  The remainder of the gland is placed in ice saline on the back table.  Next we explored the left inferior position.  The inferior pole of the thyroid lobe was mobilized.  The thyroid thymic tract was opened and followed into the anterior mediastinum.  Only thymic tissue was identified.  However, immediately posterior to the thyro-thymic tract, there was an enlarged parathyroid gland identified.  This was gently dissected out preserving surrounding structures.  The vascular hilum was divided between ligaclips with the harmonic scalpel and the gland was excised.  Frozen section biopsy confirmed  hypercellular parathyroid tissue.  The remainder the gland was placed in ice saline on the back table.  Next we  turned our attention to the right side.  Strap muscles are again reflected laterally.  The right thyroid lobe is dominated by a large nodular mass which had been evaluated by ultrasound in the superior pole.  It was gently mobilized.  Venous tributaries were divided between ligaclips.  Exploration revealed an enlarged heterogeneous parathyroid gland at approximately the level of the inferior thyroid artery.  It appeared inflammatory and was somewhat adherent to surrounding structures.  With gentle dissection it was mobilized.  Vascular structures were divided between small ligaclips.  The entire gland was excised.  Biopsy was submitted to pathology which confirmed hypercellular parathyroid tissue.  The remainder the gland was placed in ice saline on the back table.  Further exploration superiorly failed to reveal any other evidence of abnormal parathyroid tissue at the superior pole of the thyroid.  Therefore we turned our attention inferiorly.  Again the inferior pole of the thyroid lobe was explored.  The thyro-thymic tract was identified and opened and within the upper portion of the thyro-thymic tract was identified and enlarged parathyroid gland consistent with parathyroid adenoma.  This gland was mobilized and excised in its entirety.  Frozen section biopsy again confirmed hypercellular parathyroid tissue perhaps containing a microadenoma.  Neck is irrigated with warm saline.  Good hemostasis is achieved throughout the operative field.  Fibrillar is placed throughout the operative field.  Strap muscles are reapproximated in the midline with interrupted 3-0 Vicryl sutures.  Platysma is closed with interrupted 3-0 Vicryl sutures.  Skin is anesthetized with local anesthetic and then closed with a running 4-0 Monocryl subcuticular suture.  Dermabond was placed as dressing.  Next the right arm was extended on an armboard.  The area overlying the right brachial radialis muscle was then prepped and draped in  the usual fashion.  A second timeout was carried out.  The parathyroid tissue from the right inferior position was selected.  It was divided into 1 mm fragments.  8 of these fragments were then used for autotransplantation.  The remainder the gland was submitted to pathology for review.  Incision was made over the right brachial radialis muscle.  Dissection was carried through subcutaneous tissues and skin flaps were developed circumferentially.  Cutaneous nerves were identified and preserved.  An incision was made into the muscle fascia.  Using a mosquito hemostat a muscular pocket was created.  A fragment of parathyroid tissue was inserted into the muscular pocket and the overlying fascia was closed with 4-0 Prolene suture.  This exercise was repeated 8 times.  Good hemostasis was noted.  Subcutaneous tissues were closed with interrupted 3-0 Vicryl sutures.  Skin was anesthetized with local anesthetic.  Skin edges were reapproximated with a running 4-0 Monocryl subcuticular suture.  Wound was washed and dried and Dermabond was applied as dressing.    Patient was awakened from anesthesia and transported to the recovery room.  The patient tolerated the procedure well.   Armandina Gemma, [place] [facility] Office: [phone]

## 2022-03-18 NOTE — Transfer of Care (Signed)
Immediate Anesthesia Transfer of Care Note  Patient: Ruth Gutierrez [id number]  Procedure(s) Performed: TOTAL PARATHYROIDECTOMY (Neck) PARATHYROID AUTOTRANSPLANT RIGHT FOREARM (Right: Arm Lower)  Patient Location: PACU  Anesthesia Type:General  Level of Consciousness: awake and oriented  Airway & Oxygen Therapy: Patient Spontanous Breathing and Patient connected to nasal cannula oxygen  Post-op Assessment: Report given to RN  Post vital signs: Reviewed and stable  Last Vitals:  Vitals Value Taken Time  BP 167/81 03/18/22 1651  Temp    Pulse 86 03/18/22 1653  Resp 19 03/18/22 1653  SpO2 93 % 03/18/22 1653  Vitals shown include unvalidated device data.  Last Pain:  Vitals:   03/18/22 0849  TempSrc:   PainSc: 5          Complications: No notable events documented.

## 2022-03-18 NOTE — Interval H&P Note (Signed)
History and Physical Interval Note:  03/18/2022 1:09 PM  Ruth Gutierrez  has presented today for surgery, with the diagnosis of SECONDARY HYPERPARATHYROIDISM, ESRD.  The various methods of treatment have been discussed with the patient and family. After consideration of risks, benefits and other options for treatment, the patient has consented to    Procedure(s): TOTAL PARATHYROIDECTOMY (N/A) THYROID AUTOTRANSPLANT RIGHT FOREARM (Right) as a surgical intervention.    The patient's history has been reviewed, patient examined, no change in status, stable for surgery.  I have reviewed the patient's chart and labs.  Questions were answered to the patient's satisfaction.    Armandina Gemma, [facility] [place] [facility] Office: [place]

## 2022-03-18 NOTE — Consult Note (Signed)
Reason for Consult: ESRD Referring Physician:  Dr. Harlow Asa  Chief Complaint: Here for elective PTX  Home meds include - norvasc 10, asa, coreg 1.25 bid, clonidine 0.1 bid, zoloft, keppra, renvela 3 ac tid, prns/ vits / supps   [facility]: [facility]  4h 400/500  67 kg   2/2 bath  AVF  LUA   Hep none  - hectorol 8 ug IV tiw  - mircera 225 q2wk last given 8/2   Assessment/Plan: ESRD: MWF HD. HD tomorrow.  Volume/ hypertension: looks relatively euvolemic on exam with no resp issues. Will also restart home antihypertensive regimen. EDW just lowered by 0.5kg past week and she had cramping last week as well. Would not plan on lowering her EDW any further than current EDW during this hospitalization. Anemia of ESRD: Last gien Mircera on 8/2, due for next dose tomorrow.  MBD CKD: Will add on phos. Cont vdra w/ HD; temporarily will switch to Calcitriol while she's in the hospital given high risk for developing hungry bone syndrome with a PTH of 4139 on 7/19. Her phos also tends to run high in the 7-8.3 range for the past 3 mths.  - Resume renvela 3 tabs TIDM; sometimes post PTX phos can drop but her phos has been high for many months already and unlikely to drop below normal level. - Will also give Calcitriol 53mg daily post PTX which may need to be adjusted as an outpatient. S/P PTX - only on clears, c/o neck pain; will start CaGluconate as well 2gm in D5W '@50ml'$ /hr with ionized Ca++ q8hrs.   HPI: Ruth Gutierrez an 56y.o. female w/ hx of ESRD on HD, hx failed renal transplant, anemia of CKD, who presented for an elective parathyroidectomy by Dr. GHarlow Asa She was last dialyzed on Monday for a full 4hr treatment and left at her EDW of 67kg. PTH was 4139 on 02/19/2022. Patient currently dialyzing through a left upper arm access and last treatment was on Monday. She has bone and joint pain and currently being treated for renal osteodystrophy with Sensipar '180mg'$  TIW in center and Hectorol 81m IV TID. She had  a total parathyroidectomy with autotransplantation to the right brachioradialis muscle on 8/15 by Dr. GeHarlow Asa  Goes to HD MWF, had HD yesterday. [facility].  ESRD > 10 y rs. [facility]    Daughter at bedside. Pt provided most of the history.   ROS Pertinent items are noted in HPI.  Chemistry and CBC:   Recent Labs  Lab 03/18/22 0858  NA 139  K 3.8  CL 95*  GLUCOSE 78  BUN 31*  CREATININE 7.80*   Recent Labs  Lab 03/18/22 0858  HGB 10.2*  HCT 30.0*   Liver Function Tests: No results for input(s): "AST", "ALT", "ALKPHOS", "BILITOT", "PROT", "ALBUMIN" in the last 168 hours. No results for input(s): "LIPASE", "AMYLASE" in the last 168 hours. No results for input(s): "AMMONIA" in the last 168 hours. Cardiac Enzymes: No results for input(s): "CKTOTAL", "CKMB", "CKMBINDEX", "TROPONINI" in the last 168 hours. Iron Studies: No results for input(s): "IRON", "TIBC", "TRANSFERRIN", "FERRITIN" in the last 72 hours. PT/INR: '@LABRCNTIP'$ (inr:5)  Xrays/Other Studies: ) Results for orders placed or performed during the hospital encounter of 03/18/22 (from the past 48 hour(s))  I-STAT, chem 8     Status: Abnormal   Collection Time: 03/18/22  8:58 AM  Result Value Ref Range   Sodium 139  135 - 145 mmol/L   Potassium 3.8 3.5 - 5.1 mmol/L   Chloride 95 (L) 98 - 111 mmol/L   BUN 31 (H) 6 - 20 mg/dL   Creatinine, Ser 7.80 (H) 0.44 - 1.00 mg/dL   Glucose, Bld 78 70 - 99 mg/dL    Comment: Glucose reference range applies only to samples taken after fasting for at least 8 hours.   Calcium, Ion 1.06 (L) 1.15 - 1.40 mmol/L   TCO2 31 22 - 32 mmol/L   Hemoglobin 10.2 (L) 12.0 - 15.0 g/dL   HCT 30.0 (L) 36.0 - 46.0 %   No results found.  PMH:   Past Medical History:  Diagnosis Date   Anemia of chronic disease    Arthritis    Deceased-donor kidney transplant    Performed at [facility], April 2010.  Initial ESRD due to HTN nephropathy    Eczema    ESRD (end stage renal disease) ([place])    M/W/F dialysis   FUO (fever of unknown origin) 05/17/2015   GERD (gastroesophageal reflux disease)    Headache(784.0)    History of hyperparathyroidism    Hypertension    Peritonitis ([place]) 10/2019   Shortness of breath    Wears glasses     PSH:   Past Surgical History:  Procedure Laterality Date   A/V FISTULAGRAM Left 02/12/2017   Procedure: A/V Fistulagram;  Surgeon: Algernon Huxley, MD;  Location: [place] [facility];  Service: Cardiovascular;  Laterality: Left;   A/V FISTULAGRAM Left 12/30/2017   Procedure: A/V FISTULAGRAM;  Surgeon: Algernon Huxley, MD;  Location: [place] [facility];  Service: Cardiovascular;  Laterality: Left;   A/V FISTULAGRAM Left 04/01/2021   Procedure: A/V FISTULAGRAM;  Surgeon: Algernon Huxley, MD;  Location: [place] [facility];  Service: Cardiovascular;  Laterality: Left;   A/V SHUNT INTERVENTION N/A 02/12/2017   Procedure: A/V Shunt Intervention;  Surgeon: Algernon Huxley, MD;  Location: [place] [facility];  Service: Cardiovascular;  Laterality: N/A;   AV FISTULA PLACEMENT     BASCILIC VEIN TRANSPOSITION Left 10/30/2014   Procedure: LEFT [place];  Surgeon: Rosetta Posner, MD;  Location: [place];  Service: Vascular;  Laterality: Left;   [place] Left 01/03/2015   Procedure: LEFT ARM 2ND STAGE [place];  Surgeon: Rosetta Posner, MD;  Location: [place];  Service: Vascular;  Laterality: Left;   BIOPSY  10/23/2021   Procedure: BIOPSY;  Surgeon: Jackquline Denmark, MD;  Location: [facility] ENDOSCOPY;  Service: Gastroenterology;;   BREAST BIOPSY Left    Patient doesn't remember any information from previous procedure.   CAPD REMOVAL N/A 10/31/2019   Procedure: PERITONEAL DIALYSIS  (CAPD) INFECTED CATHETER REMOVAL;  Surgeon: Coralie Keens, MD;  Location: [place];  Service: General;  Laterality: N/A;   CRANIOTOMY N/A 11/01/2020   Procedure: CRANIOTOMY FOR TUMOR EXCISION;   Surgeon: [name], MD;  Location: [place];  Service: Neurosurgery;  Laterality: N/A;   ESOPHAGOGASTRODUODENOSCOPY (EGD) WITH PROPOFOL N/A 10/23/2021   Procedure: ESOPHAGOGASTRODUODENOSCOPY (EGD) WITH PROPOFOL;  Surgeon: Jackquline Denmark, MD;  Location: [place];  Service: Gastroenterology;  Laterality: N/A;   FRACTURE SURGERY     left foot,baby toe nad next toe missing   INSERTION OF DIALYSIS CATHETER Right 10/30/2014   Procedure: INSERTION OF DIALYSIS CATHETER;  Surgeon: Rosetta Posner, MD;  Location: [place];  Service: Vascular;  Laterality: Right;   KIDNEY TRANSPLANT  11/02/2008   [facility])  PLACEMENT OF LUMBAR DRAIN N/A 11/01/2020   Procedure: PLACEMENT OF LUMBAR DRAIN;  Surgeon: [name], MD;  Location: [place];  Service: Neurosurgery;  Laterality: N/A;   WISDOM TOOTH EXTRACTION      Allergies:  Allergies  Allergen Reactions   Penicillins Itching and Rash    Did it involve swelling of the face/tongue/throat, SOB, or low BP?Y Did it involve sudden or severe rash/hives, skin peeling, or any reaction on the inside of your mouth or nose? Y Did you need to seek medical attention at a hospital or doctor's office? Y When did it last happen?  2016     If all above answers are "NO", may proceed with cephalosporin use.    Medications:   Prior to Admission medications   Medication Sig Start Date End Date Taking? Authorizing Provider  amLODipine (NORVASC) 10 MG tablet Take 1 tablet (10 mg total) by mouth daily. 07/02/21  Yes Lajean Manes, MD  carvedilol (COREG) 25 MG tablet Take 1 tablet (25 mg total) by mouth 2 (two) times daily. 11/07/21 11/07/22 Yes Gaylan Gerold, DO  diclofenac Sodium (VOLTAREN) 1 % GEL Apply 2 g topically 2 (two) times daily as needed (Right shoulder pain). 01/20/22  Yes Gaylan Gerold, DO  diphenhydrAMINE (BENADRYL) 25 mg capsule Take 25 mg by mouth at bedtime as needed for sleep.   Yes [provider]  lidocaine-prilocaine (EMLA) cream Apply 1  application. topically every Monday, Wednesday, and Friday with hemodialysis. 02/01/20  Yes [provider]  sertraline (ZOLOFT) 50 MG tablet Take 1 tablet (50 mg total) by mouth daily. 07/02/21 07/02/22 Yes Lajean Manes, MD  sevelamer carbonate (RENVELA) 800 MG tablet Take 2,400 mg by mouth 3 (three) times daily with meals. 10/07/21  Yes [provider]  albuterol (VENTOLIN HFA) 108 (90 Base) MCG/ACT inhaler Inhale 3 puffs into the lungs daily as needed for wheezing or shortness of breath. 11/12/21 02/10/22  Gaylan Gerold, DO  ondansetron (ZOFRAN-ODT) 4 MG disintegrating tablet Take 1 tablet (4 mg total) by mouth every 8 (eight) hours as needed for nausea or vomiting. 10/24/21   Rosezetta Schlatter, MD  pantoprazole (PROTONIX) 40 MG tablet Take 1 tablet (40 mg total) by mouth 2 (two) times daily before a meal. 10/24/21 02/13/22  Rosezetta Schlatter, MD  Polyethylene Glycol 400 (BLINK TEARS OP) Place 1 drop into both eyes daily as needed (tired eyes).    [provider]    Discontinued Meds:   Medications Discontinued During This Encounter  Medication Reason   bupivacaine (PF) (MARCAINE) 0.25 % injection Patient Discharge   0.9 % irrigation (POUR BTL) Patient Discharge   hemostatic agents (no charge) Optime Patient Discharge   labetalol (NORMODYNE) injection 10 mg    Chlorhexidine Gluconate Cloth 2 % PADS 6 each    Chlorhexidine Gluconate Cloth 2 % PADS 6 each    0.9 %  sodium chloride infusion     Social History:  reports that she has been smoking cigarettes. She has a 13.50 pack-year smoking history. She has never used smokeless tobacco. She reports that she does not drink alcohol and does not use drugs.  Family History:   Family History  Problem Relation Age of Onset   Hypertension Mother    Hypertension Father    Hypertension Sister    Hyperlipidemia Sister    Hypertension Sister    Diabetes Brother    Deep vein thrombosis Brother    Kidney disease Brother        on  HD    Colon cancer Neg Hx    Esophageal cancer Neg Hx    Rectal cancer Neg Hx    Breast cancer Neg Hx     Blood pressure (!) 155/90, pulse 73, temperature 98.2 F (36.8 C), resp. rate 12, height '5\' 7"'$  (1.702 m), weight 65.8 kg, last menstrual period 06/02/2015, SpO2 92 %. Gen alert, no distress Chest clear bilat to bases, no wheezing or rales RRR no RG Abd soft ntnd no mass or ascites +bs GU no foley MS no joint effusions or deformity Ext trace- no pretib edema  Neuro is alert, Ox 3 , nf LUA BCF +bruit        Dwana Melena, MD 03/18/2022, 6:47 PM

## 2022-03-18 NOTE — Plan of Care (Signed)

## 2022-03-18 NOTE — Anesthesia Preprocedure Evaluation (Signed)
Anesthesia Evaluation  Patient identified by MRN, date of birth, ID band Patient awake and Patient confused    Reviewed: Allergy & Precautions, NPO status , Patient's Chart, lab work & pertinent test results, reviewed documented beta blocker date and time   Airway Mallampati: II       Dental  (+) Poor Dentition, Missing, Dental Advisory Given, Edentulous Upper   Pulmonary Current Smoker,    Pulmonary exam normal        Cardiovascular hypertension, Pt. on medications and Pt. on home beta blockers  Rhythm:Regular Rate:Tachycardia     Neuro/Psych PSYCHIATRIC DISORDERS Depression S/P brain tumor ressection    GI/Hepatic Neg liver ROS, GERD  ,  Endo/Other    Renal/GU ESRF and DialysisRenal disease  negative genitourinary   Musculoskeletal   Abdominal Normal abdominal exam  (+)   Peds  Hematology  (+) Blood dyscrasia, anemia ,   Anesthesia Other Findings   Reproductive/Obstetrics                             Anesthesia Physical  Anesthesia Plan  ASA: 3  Anesthesia Plan: General   Post-op Pain Management: Minimal or no pain anticipated and Tylenol PO (pre-op)*   Induction: Intravenous  PONV Risk Score and Plan: 2  Airway Management Planned: Oral ETT  Additional Equipment: None  Intra-op Plan:   Post-operative Plan: Extubation in OR  Informed Consent: I have reviewed the patients History and Physical, chart, labs and discussed the procedure including the risks, benefits and alternatives for the proposed anesthesia with the patient or authorized representative who has indicated his/her understanding and acceptance.     Dental advisory given  Plan Discussed with: CRNA and Anesthesiologist  Anesthesia Plan Comments:         Anesthesia Quick Evaluation

## 2022-03-18 NOTE — Plan of Care (Signed)
  Problem: Education: Goal: Knowledge of General Education information will improve Description: Including pain rating scale, medication(s)/side effects and non-pharmacologic comfort measures 03/18/2022 2335 by Jule Ser, RN Outcome: Progressing 03/18/2022 1950 by Jule Ser, RN Outcome: Progressing   Problem: Health Behavior/Discharge Planning: Goal: Ability to manage health-related needs will improve 03/18/2022 2335 by Jule Ser, RN Outcome: Progressing 03/18/2022 1950 by Jule Ser, RN Outcome: Progressing   Problem: Clinical Measurements: Goal: Ability to maintain clinical measurements within normal limits will improve 03/18/2022 2335 by Jule Ser, RN Outcome: Progressing 03/18/2022 1950 by Jule Ser, RN Outcome: Progressing Goal: Will remain free from infection 03/18/2022 2335 by Jule Ser, RN Outcome: Progressing 03/18/2022 1950 by Jule Ser, RN Outcome: Progressing Goal: Diagnostic test results will improve 03/18/2022 2335 by Jule Ser, RN Outcome: Progressing 03/18/2022 1950 by Jule Ser, RN Outcome: Progressing Goal: Respiratory complications will improve 03/18/2022 2335 by Jule Ser, RN Outcome: Progressing 03/18/2022 1950 by Jule Ser, RN Outcome: Progressing Goal: Cardiovascular complication will be avoided 03/18/2022 2335 by Jule Ser, RN Outcome: Progressing 03/18/2022 1950 by Jule Ser, RN Outcome: Progressing   Problem: Activity: Goal: Risk for activity intolerance will decrease 03/18/2022 2335 by Jule Ser, RN Outcome: Progressing 03/18/2022 1950 by Jule Ser, RN Outcome: Progressing   Problem: Nutrition: Goal: Adequate nutrition will be maintained 03/18/2022 2335 by Jule Ser, RN Outcome: Progressing 03/18/2022 1950 by Jule Ser, RN Outcome: Progressing   Problem: Coping: Goal: Level of anxiety will decrease 03/18/2022 2335 by Jule Ser, RN Outcome: Progressing 03/18/2022 1950 by Jule Ser, RN Outcome: Progressing   Problem: Elimination: Goal: Will not experience complications related to bowel motility 03/18/2022 2335 by Jule Ser, RN Outcome: Progressing 03/18/2022 1950 by Jule Ser, RN Outcome: Progressing Goal: Will not experience complications related to urinary retention 03/18/2022 2335 by Jule Ser, RN Outcome: Progressing 03/18/2022 1950 by Jule Ser, RN Outcome: Progressing   Problem: Pain Managment: Goal: General experience of comfort will improve 03/18/2022 2335 by Jule Ser, RN Outcome: Progressing 03/18/2022 1950 by Jule Ser, RN Outcome: Progressing   Problem: Safety: Goal: Ability to remain free from injury will improve 03/18/2022 2335 by Jule Ser, RN Outcome: Progressing 03/18/2022 1950 by Jule Ser, RN Outcome: Progressing   Problem: Skin Integrity: Goal: Risk for impaired skin integrity will decrease 03/18/2022 2335 by Jule Ser, RN Outcome: Progressing 03/18/2022 1950 by Jule Ser, RN Outcome: Progressing

## 2022-03-19 ENCOUNTER — Encounter (HOSPITAL_COMMUNITY): Payer: Self-pay | Admitting: Surgery

## 2022-03-19 LAB — RENAL FUNCTION PANEL
Albumin: 3.2 g/dL — ABNORMAL LOW (ref 3.5–5.0)
Anion gap: 16 — ABNORMAL HIGH (ref 5–15)
BUN: 41 mg/dL — ABNORMAL HIGH (ref 6–20)
CO2: 26 mmol/L (ref 22–32)
Calcium: 7 mg/dL — ABNORMAL LOW (ref 8.9–10.3)
Chloride: 96 mmol/L — ABNORMAL LOW (ref 98–111)
Creatinine, Ser: 8.76 mg/dL — ABNORMAL HIGH (ref 0.44–1.00)
GFR, Estimated: 5 mL/min — ABNORMAL LOW (ref >60)
Glucose, Bld: 109 mg/dL — ABNORMAL HIGH (ref 70–99)
Phosphorus: 6.8 mg/dL — ABNORMAL HIGH (ref 2.5–4.6)
Potassium: 4.4 mmol/L (ref 3.5–5.1)
Sodium: 138 mmol/L (ref 135–145)

## 2022-03-19 LAB — CBC
HCT: 33.1 % — ABNORMAL LOW (ref 36.0–46.0)
Hemoglobin: 10.2 g/dL — ABNORMAL LOW (ref 12.0–15.0)
MCH: 28.9 pg (ref 26.0–34.0)
MCHC: 30.8 g/dL (ref 30.0–36.0)
MCV: 93.8 fL (ref 80.0–100.0)
Platelets: 156 10*3/uL (ref 150–400)
RBC: 3.53 MIL/uL — ABNORMAL LOW (ref 3.87–5.11)
RDW: 17.3 % — ABNORMAL HIGH (ref 11.5–15.5)
WBC: 7.5 10*3/uL (ref 4.0–10.5)
nRBC: 0 % (ref 0.0–0.2)

## 2022-03-19 LAB — PHOSPHORUS: Phosphorus: 6.6 mg/dL — ABNORMAL HIGH (ref 2.5–4.6)

## 2022-03-19 MED ORDER — CALCIUM CARBONATE 1250 (500 CA) MG PO TABS
3.0000 | ORAL_TABLET | Freq: Three times a day (TID) | ORAL | Status: DC
  Administered 2022-03-19: 3 via ORAL
  Filled 2022-03-19: qty 3

## 2022-03-19 NOTE — Progress Notes (Addendum)
    Assessment & Plan: POD#1 - status post total parathyroidectomy with autotransplantation to right forearm  Expected drop in calcium levels - 7.0 mg/dl this AM  Tolerating liquids without dysphagia - will advance diet  Pain control  Expect HD today per nephrology  On contact precautions (?) - no explanation in chart        Armandina Gemma, MD [place] practice Office: [phone]        Chief Complaint: Secondary hyperparathyroidism  Subjective: Patient up in room, ambulating; some pain; tolerating liquid diet - wants to eat.  Objective: Vital signs in last 24 hours: Temp:  [97.7 F (36.5 C)-98.7 F (37.1 C)] 98.6 F (37 C) (08/16 0850) Pulse Rate:  [71-95] 92 (08/16 0850) Resp:  [11-21] 17 (08/16 0850) BP: (128-195)/(69-92) 132/69 (08/16 0850) SpO2:  [89 %-100 %] 90 % (08/16 0850) Last BM Date : 03/17/22  Intake/Output from previous day: 08/15 0701 - 08/16 0700 In: 853.1 [I.V.:403.1; IV Piggyback:450] Out: 23 [Blood:23] Intake/Output this shift: Total I/O In: 310 [P.O.:310] Out: -   Physical Exam: HEENT - sclerae clear, mucous membranes moist Neck - wound dry and intact; moderate soft tissue swelling; voice soft; no stridor Ext - right forearm incision dry and intact with Dermabond in place Neuro - alert & oriented, no focal deficits  Lab Results:  Recent Labs    03/18/22 0858 03/19/22 0132  WBC  --  7.5  HGB 10.2* 10.2*  HCT 30.0* 33.1*  PLT  --  156   BMET Recent Labs    03/18/22 0858 03/19/22 0132  NA 139 138  K 3.8 4.4  CL 95* 96*  CO2  --  26  GLUCOSE 78 109*  BUN 31* 41*  CREATININE 7.80* 8.76*  CALCIUM  --  7.0*   PT/INR No results for input(s): "LABPROT", "INR" in the last 72 hours. Comprehensive Metabolic Panel:    Component Value Date/Time   NA 138 03/19/2022 0132   NA 139 03/18/2022 0858   NA 142 06/11/2021 1153   NA 141 05/23/2021 1603   K 4.4 03/19/2022 0132   K 3.8 03/18/2022 0858   CL 96 (L)  03/19/2022 0132   CL 95 (L) 03/18/2022 0858   CO2 26 03/19/2022 0132   CO2 25 10/24/2021 0420   BUN 41 (H) 03/19/2022 0132   BUN 31 (H) 03/18/2022 0858   BUN 31 (H) 06/11/2021 1153   BUN 35 (H) 05/23/2021 1603   CREATININE 8.76 (H) 03/19/2022 0132   CREATININE 7.80 (H) 03/18/2022 0858   CREATININE 1.47 (H) 04/14/2013 1149   CREATININE 1.00 05/26/2011 1414   GLUCOSE 109 (H) 03/19/2022 0132   GLUCOSE 78 03/18/2022 0858   CALCIUM 7.0 (L) 03/19/2022 0132   CALCIUM 8.2 (L) 10/24/2021 0420   CALCIUM 7.2 (L) 01/13/2007 0830   AST 30 10/29/2020 0944   AST 16 10/28/2019 1114   ALT 20 10/29/2020 0944   ALT 17 10/28/2019 1114   ALKPHOS 256 (H) 10/29/2020 0944   ALKPHOS 162 (H) 10/28/2019 1114   BILITOT 1.2 10/29/2020 0944   BILITOT 4.4 (H) 10/28/2019 1114   PROT 6.7 10/29/2020 0944   PROT 6.4 (L) 10/28/2019 1114   ALBUMIN 3.2 (L) 03/19/2022 0132   ALBUMIN 2.8 (L) 10/24/2021 0420    Studies/Results: No results found.    Armandina Gemma 03/19/2022   Patient ID: Ruth Gutierrez, female   DOB: 1966/07/05, 56 y.o.   MRN: [id number]

## 2022-03-19 NOTE — Progress Notes (Signed)
  [facility] Progress Note    Assessment/ Plan:   ESRD, h/o renal txp 2010: will continue with MWF schedule 2. Volume/ hypertension: looks relatively euvolemic on exam with no resp issues. will leave EDW as is for now (recently lowered). UF as tolerated 3. Anemia of ESRD: Last gien Mircera on 8/2, restarted ESA 8/16 4. MBD CKD, total PTX 8/15 (autotransplant to right brachioradialis): c/w calcitriol, will start oscal 3 tabs TID. Corrected cal ~7.6, ionized calcium pending. Monitor daily calcium, phos, albumin, ionized calcium   OP HD: [facility]  4h 400/500  67 kg   2/2 bath  AVF  LUA   Hep none  - hectorol 8 ug IV tiw  - mircera 225 q2wk last given 8/2  Subjective:   Patient seen and examined in room. No acute events, no complaints.   Objective:   BP 132/69 (BP Location: Right Arm)   Pulse 92   Temp 98.6 F (37 C) (Oral)   Resp 17   Ht '5\' 7"'$  (1.702 m)   Wt 65.8 kg   LMP 06/02/2015   SpO2 90%   BMI 22.71 kg/m   Intake/Output Summary (Last 24 hours) at 03/19/2022 1117 Last data filed at 03/19/2022 0900 Gross per 24 hour  Intake 1163.08 ml  Output 23 ml  Net 1140.08 ml   Weight change:   Physical Exam: Gen:NAD CVS:RRR Resp:CTA BL JQZ:ESPQ, nt/nd Ext: no sig edema Neuro: awake, alert Dialysis access: LUE AVF +b/t  Imaging: No results found.  Labs: BMET Recent Labs  Lab 03/18/22 0858 03/19/22 0132  NA 139 138  K 3.8 4.4  CL 95* 96*  CO2  --  26  GLUCOSE 78 109*  BUN 31* 41*  CREATININE 7.80* 8.76*  CALCIUM  --  7.0*  PHOS  --  6.8*  6.6*   CBC Recent Labs  Lab 03/18/22 0858 03/19/22 0132  WBC  --  7.5  HGB 10.2* 10.2*  HCT 30.0* 33.1*  MCV  --  93.8  PLT  --  156    Medications:     amLODipine  10 mg Oral Daily   calcitRIOL  1 mcg Oral Daily   carvedilol  25 mg Oral BID   Chlorhexidine Gluconate Cloth  6 each Topical Q0600   darbepoetin (ARANESP) injection - DIALYSIS  200 mcg Intravenous Q Wed-HD   pantoprazole  40 mg  Oral BID AC   sertraline  50 mg Oral Daily      Gean Quint, MD [facility] 03/19/2022, 11:17 AM

## 2022-03-20 ENCOUNTER — Inpatient Hospital Stay (HOSPITAL_COMMUNITY)
Admission: EM | Admit: 2022-03-20 | Discharge: 2022-03-28 | DRG: 640 | Disposition: A | Discharge status: Home / Self Care | Payer: Medicare Other | Attending: Internal Medicine | Admitting: Internal Medicine

## 2022-03-20 ENCOUNTER — Telehealth: Payer: Self-pay

## 2022-03-20 ENCOUNTER — Emergency Department (HOSPITAL_COMMUNITY): Payer: Medicare Other

## 2022-03-20 ENCOUNTER — Telehealth: Payer: Self-pay | Admitting: Surgery

## 2022-03-20 ENCOUNTER — Encounter (HOSPITAL_COMMUNITY): Payer: Self-pay | Admitting: Emergency Medicine

## 2022-03-20 DIAGNOSIS — T8612 Kidney transplant failure: Secondary | ICD-10-CM | POA: Diagnosis not present

## 2022-03-20 DIAGNOSIS — Z83438 Family history of other disorder of lipoprotein metabolism and other lipidemia: Secondary | ICD-10-CM

## 2022-03-20 DIAGNOSIS — Z841 Family history of disorders of kidney and ureter: Secondary | ICD-10-CM | POA: Diagnosis not present

## 2022-03-20 DIAGNOSIS — R1011 Right upper quadrant pain: Secondary | ICD-10-CM | POA: Diagnosis not present

## 2022-03-20 DIAGNOSIS — E860 Dehydration: Secondary | ICD-10-CM | POA: Diagnosis not present

## 2022-03-20 DIAGNOSIS — E892 Postprocedural hypoparathyroidism: Secondary | ICD-10-CM

## 2022-03-20 DIAGNOSIS — J440 Chronic obstructive pulmonary disease with acute lower respiratory infection: Secondary | ICD-10-CM | POA: Diagnosis not present

## 2022-03-20 DIAGNOSIS — N186 End stage renal disease: Secondary | ICD-10-CM | POA: Diagnosis not present

## 2022-03-20 DIAGNOSIS — Z79899 Other long term (current) drug therapy: Secondary | ICD-10-CM | POA: Diagnosis not present

## 2022-03-20 DIAGNOSIS — R000 Tachycardia, unspecified: Secondary | ICD-10-CM | POA: Diagnosis not present

## 2022-03-20 DIAGNOSIS — N2581 Secondary hyperparathyroidism of renal origin: Secondary | ICD-10-CM | POA: Diagnosis present

## 2022-03-20 DIAGNOSIS — D631 Anemia in chronic kidney disease: Secondary | ICD-10-CM | POA: Diagnosis present

## 2022-03-20 DIAGNOSIS — E890 Postprocedural hypothyroidism: Secondary | ICD-10-CM | POA: Diagnosis not present

## 2022-03-20 DIAGNOSIS — J189 Pneumonia, unspecified organism: Secondary | ICD-10-CM | POA: Diagnosis present

## 2022-03-20 DIAGNOSIS — N250 Renal osteodystrophy: Secondary | ICD-10-CM | POA: Diagnosis present

## 2022-03-20 DIAGNOSIS — F32A Depression, unspecified: Secondary | ICD-10-CM | POA: Diagnosis not present

## 2022-03-20 DIAGNOSIS — K219 Gastro-esophageal reflux disease without esophagitis: Secondary | ICD-10-CM | POA: Diagnosis present

## 2022-03-20 DIAGNOSIS — I120 Hypertensive chronic kidney disease with stage 5 chronic kidney disease or end stage renal disease: Secondary | ICD-10-CM | POA: Diagnosis not present

## 2022-03-20 DIAGNOSIS — Z833 Family history of diabetes mellitus: Secondary | ICD-10-CM | POA: Diagnosis not present

## 2022-03-20 DIAGNOSIS — Z8249 Family history of ischemic heart disease and other diseases of the circulatory system: Secondary | ICD-10-CM

## 2022-03-20 DIAGNOSIS — Z880 Allergy status to penicillin: Secondary | ICD-10-CM | POA: Diagnosis not present

## 2022-03-20 DIAGNOSIS — F17210 Nicotine dependence, cigarettes, uncomplicated: Secondary | ICD-10-CM | POA: Diagnosis not present

## 2022-03-20 DIAGNOSIS — Z86011 Personal history of benign neoplasm of the brain: Secondary | ICD-10-CM | POA: Diagnosis not present

## 2022-03-20 DIAGNOSIS — Y830 Surgical operation with transplant of whole organ as the cause of abnormal reaction of the patient, or of later complication, without mention of misadventure at the time of the procedure: Secondary | ICD-10-CM | POA: Diagnosis present

## 2022-03-20 DIAGNOSIS — Z992 Dependence on renal dialysis: Secondary | ICD-10-CM

## 2022-03-20 LAB — RENAL FUNCTION PANEL
Albumin: 2.9 g/dL — ABNORMAL LOW (ref 3.5–5.0)
Anion gap: 15 (ref 5–15)
BUN: 38 mg/dL — ABNORMAL HIGH (ref 6–20)
CO2: 25 mmol/L (ref 22–32)
Calcium: 5.3 mg/dL — CL (ref 8.9–10.3)
Chloride: 96 mmol/L — ABNORMAL LOW (ref 98–111)
Creatinine, Ser: 8.78 mg/dL — ABNORMAL HIGH (ref 0.44–1.00)
GFR, Estimated: 5 mL/min — ABNORMAL LOW (ref >60)
Glucose, Bld: 83 mg/dL (ref 70–99)
Phosphorus: 4.1 mg/dL (ref 2.5–4.6)
Potassium: 3.8 mmol/L (ref 3.5–5.1)
Sodium: 136 mmol/L (ref 135–145)

## 2022-03-20 LAB — I-STAT CHEM 8, ED
BUN: 38 mg/dL — ABNORMAL HIGH (ref 6–20)
Calcium, Ion: 0.54 mmol/L — CL (ref 1.15–1.40)
Chloride: 94 mmol/L — ABNORMAL LOW (ref 98–111)
Creatinine, Ser: 10.7 mg/dL — ABNORMAL HIGH (ref 0.44–1.00)
Glucose, Bld: 103 mg/dL — ABNORMAL HIGH (ref 70–99)
HCT: 32 % — ABNORMAL LOW (ref 36.0–46.0)
Hemoglobin: 10.9 g/dL — ABNORMAL LOW (ref 12.0–15.0)
Potassium: 4 mmol/L (ref 3.5–5.1)
Sodium: 133 mmol/L — ABNORMAL LOW (ref 135–145)
TCO2: 26 mmol/L (ref 22–32)

## 2022-03-20 LAB — SURGICAL PATHOLOGY

## 2022-03-20 LAB — COMPREHENSIVE METABOLIC PANEL
ALT: 17 U/L (ref 0–44)
AST: 22 U/L (ref 15–41)
Albumin: 3.2 g/dL — ABNORMAL LOW (ref 3.5–5.0)
Alkaline Phosphatase: 235 U/L — ABNORMAL HIGH (ref 38–126)
Anion gap: 17 — ABNORMAL HIGH (ref 5–15)
BUN: 40 mg/dL — ABNORMAL HIGH (ref 6–20)
CO2: 26 mmol/L (ref 22–32)
Calcium: 5.3 mg/dL — CL (ref 8.9–10.3)
Chloride: 93 mmol/L — ABNORMAL LOW (ref 98–111)
Creatinine, Ser: 9.65 mg/dL — ABNORMAL HIGH (ref 0.44–1.00)
GFR, Estimated: 4 mL/min — ABNORMAL LOW (ref >60)
Glucose, Bld: 103 mg/dL — ABNORMAL HIGH (ref 70–99)
Potassium: 4 mmol/L (ref 3.5–5.1)
Sodium: 136 mmol/L (ref 135–145)
Total Bilirubin: 0.6 mg/dL (ref 0.3–1.2)
Total Protein: 7.3 g/dL (ref 6.5–8.1)

## 2022-03-20 LAB — CBC
HCT: 32.3 % — ABNORMAL LOW (ref 36.0–46.0)
Hemoglobin: 10.1 g/dL — ABNORMAL LOW (ref 12.0–15.0)
MCH: 28.6 pg (ref 26.0–34.0)
MCHC: 31.3 g/dL (ref 30.0–36.0)
MCV: 91.5 fL (ref 80.0–100.0)
Platelets: 158 10*3/uL (ref 150–400)
RBC: 3.53 MIL/uL — ABNORMAL LOW (ref 3.87–5.11)
RDW: 17.1 % — ABNORMAL HIGH (ref 11.5–15.5)
WBC: 7.3 10*3/uL (ref 4.0–10.5)
nRBC: 0 % (ref 0.0–0.2)

## 2022-03-20 LAB — I-STAT BETA HCG BLOOD, ED (MC, WL, AP ONLY): I-stat hCG, quantitative: 7.6 m[IU]/mL — ABNORMAL HIGH (ref <5)

## 2022-03-20 LAB — LIPASE, BLOOD: Lipase: 34 U/L (ref 11–51)

## 2022-03-20 LAB — CALCIUM, IONIZED
Calcium, Ionized, Serum: 4 mg/dL — ABNORMAL LOW (ref 4.5–5.6)
Calcium, Ionized, Serum: 3.7 mg/dL — ABNORMAL LOW (ref 4.5–5.6)

## 2022-03-20 MED ORDER — CALCITRIOL 0.5 MCG PO CAPS
0.5000 ug | ORAL_CAPSULE | Freq: Three times a day (TID) | ORAL | Status: DC
  Administered 2022-03-21 – 2022-03-22 (×2): 0.5 ug via ORAL
  Filled 2022-03-20 (×6): qty 1

## 2022-03-20 MED ORDER — CALCIUM GLUCONATE-NACL 2-0.675 GM/100ML-% IV SOLN
2.0000 g | Freq: Once | INTRAVENOUS | Status: AC
  Administered 2022-03-21: 2000 mg via INTRAVENOUS
  Filled 2022-03-20: qty 100

## 2022-03-20 MED ORDER — PANTOPRAZOLE SODIUM 40 MG IV SOLR
40.0000 mg | Freq: Once | INTRAVENOUS | Status: AC
  Administered 2022-03-21: 40 mg via INTRAVENOUS
  Filled 2022-03-20: qty 10

## 2022-03-20 MED ORDER — CALCIUM CARBONATE ANTACID 500 MG PO CHEW
1000.0000 mg | CHEWABLE_TABLET | Freq: Four times a day (QID) | ORAL | Status: DC
  Administered 2022-03-21 (×4): 1000 mg via ORAL
  Filled 2022-03-20 (×4): qty 5

## 2022-03-20 NOTE — ED Notes (Signed)
Unable to obtain blood, pt is a hard stick. Pt states that she is unable to produce urine and cannot give a UA sample.

## 2022-03-20 NOTE — Discharge Summary (Signed)
    Physician Discharge Summary   Patient ID: Ruth Gutierrez MRN: [id number] DOB/AGE: Sep 05, 1965 56 y.o.  Admit date: 03/18/2022  Discharge date: 03/20/2022  Discharge Diagnoses:  Principal Problem:   Secondary renal hyperparathyroidism ([place]) Active Problems:   S/P kidney transplant   ESRD on hemodialysis [facility])   Renal dialysis device, implant, or graft complication   Hyperparathyroidism ([place])   Secondary hyperparathyroidism of renal origin [facility])   Discharged Condition: unknown  Hospital Course: Patient was admitted for observation following parathyroid surgery.  Post op course was uncomplicated.  Pain was well controlled.  Tolerated diet.  Patient seen in consultation by nephrology and had HD on 8/16.  Patient signed out AMA on the morning of 8/17 without my knowledge.  Consults: nephrology  Treatments: surgery: total parathyroidectomy with autotransplantation to right forearm  Discharge Exam: Blood pressure (!) 158/85, pulse 94, temperature 98.4 F (36.9 C), temperature source Oral, resp. rate 18, height '5\' 7"'$  (1.702 m), weight 66.7 kg, last menstrual period 06/02/2015, SpO2 100 %.  No exam performed.  Patient left AMA without my knowledge.  See nursing notes.  Disposition: unknown     Armandina Gemma, [place] [facility] Office: [phone]   Signed: Armandina Gemma 03/20/2022, 11:02 AM

## 2022-03-20 NOTE — Telephone Encounter (Signed)
-----   Message from Jackquline Denmark, MD sent at 03/19/2022  4:30 PM EDT ----- Regarding: RE: needs colonopscopy also Thanks Glendell Docker.  Remo Lipps, Pl make sure this pt is on for EGD/colon at [facility] (D/t HD) on list. If I or Glendell Docker, have any openings at [facility], can go ahead and schedule. RG   ----- Message ----- From: Gatha Mayer, MD Sent: 03/18/2022   6:39 PM EDT To: Jackquline Denmark, MD Subject: needs colonopscopy also                        This lady is on your list to rescope re: gastric ulcer you found at hospital Dialysis patient so needs to be done at hospital I did colonoscopy on her 02/2019 and is due for recall now  Fine w/ you doing her but sounds like best to do double?  She just had parathyroid surgery today  Let me know - I am good either way and she will go on wait list regardless  Thanks  Glendell Docker

## 2022-03-20 NOTE — Telephone Encounter (Signed)
Left Message for pt to call back: Pt is a dialysis pt MWF Dr. Lyndel Safe has availability oct 24th

## 2022-03-20 NOTE — ED Provider Notes (Signed)
[facility] EMERGENCY DEPARTMENT Provider Note   CSN: [id number] Arrival date & time: 03/20/22  1657     History  Chief Complaint  Patient presents with   Abdominal Pain    JEIRY Gutierrez is a 56 y.o. female on hemodialysis Monday Wednesday Friday who presents with concern for epigastric pain and melena.  Patient of note left the hospital AMA this morning following total parathyroidectomy and right forearm autotransplant for secondary Hyperparathyroidism.  She presents this evening with concern for epigastric pain and single episode of black stool.  History of Cratered gastric ulcer 10 mm in dimension noted on EGD in March 2023.  Patient has required admission to the hospital in the past secondary to GI bleed.  She denies any paresthesias, cramps, seizures, hallucinations, or dysrhythmias.  States that she is not having any pain in her arm or her neck at her surgical sites.  Does endorse generalized weakness and upper abdominal discomfort.  In addition to the above listed history patient also with history of GERD, hypertension, and eczema.  She is not anticoagulated.  HPI     Home Medications Prior to Admission medications   Medication Sig Start Date End Date Taking? Authorizing Provider  acetaminophen (TYLENOL) 325 MG tablet Take 325 mg by mouth daily as needed (pain). Take with 500 mg of Tylenol   Yes [provider]  acetaminophen (TYLENOL) 500 MG tablet Take 500 mg by mouth daily as needed (pain). Take with 325 mg of Tylenol   Yes [provider]  albuterol (VENTOLIN HFA) 108 (90 Base) MCG/ACT inhaler Inhale 3 puffs into the lungs daily as needed for wheezing or shortness of breath. 11/12/21 03/21/22 Yes Gaylan Gerold, DO  amLODipine (NORVASC) 10 MG tablet Take 1 tablet (10 mg total) by mouth daily. 07/02/21  Yes Lajean Manes, MD  carvedilol (COREG) 25 MG tablet Take 1 tablet (25 mg total) by mouth 2 (two) times daily. 11/07/21 11/07/22 Yes  Gaylan Gerold, DO  diclofenac Sodium (VOLTAREN) 1 % GEL Apply 2 g topically 2 (two) times daily as needed (Right shoulder pain). Patient taking differently: Apply 1 Application topically as needed (Right shoulder pain). 01/20/22  Yes Gaylan Gerold, DO  diphenhydrAMINE (BENADRYL) 25 mg capsule Take 50 mg by mouth 3 (three) times daily as needed for allergies.   Yes [provider]  lidocaine-prilocaine (EMLA) cream Apply 1 application. topically every Monday, Wednesday, and Friday with hemodialysis. 02/01/20  Yes [provider]  sertraline (ZOLOFT) 50 MG tablet Take 1 tablet (50 mg total) by mouth daily. 07/02/21 07/02/22 Yes Lajean Manes, MD  sevelamer carbonate (RENVELA) 800 MG tablet Take 1,600 mg by mouth See admin instructions. Take 1600 mg by mouth 1-2 times a day with meals. 10/07/21  Yes [provider]  cloNIDine (CATAPRES) 0.1 MG tablet Take 0.1 mg by mouth 2 (two) times daily. Patient not taking: Reported on 03/19/2022 01/03/22   [provider]  ondansetron (ZOFRAN-ODT) 4 MG disintegrating tablet Take 1 tablet (4 mg total) by mouth every 8 (eight) hours as needed for nausea or vomiting. Patient not taking: Reported on 03/19/2022 10/24/21   Rosezetta Schlatter, MD  pantoprazole (PROTONIX) 40 MG tablet Take 1 tablet (40 mg total) by mouth 2 (two) times daily before a meal. Patient not taking: Reported on 03/19/2022 10/24/21 02/13/22  Rosezetta Schlatter, MD  Polyethylene Glycol 400 (BLINK TEARS OP) Place 1 drop into both eyes daily as needed (tired eyes). Patient not taking: Reported on 03/19/2022  [provider]      Allergies    Penicillins    Review of Systems   Review of Systems  Constitutional:  Positive for activity change, appetite change, chills and fatigue.  HENT: Negative.    Respiratory: Negative.    Cardiovascular: Negative.   Gastrointestinal:  Positive for abdominal pain and nausea. Negative for blood in stool, diarrhea and vomiting.        Melena    Physical Exam Updated Vital Signs BP (!) 188/108   Pulse (!) 104   Temp 98.8 F (37.1 C) (Oral)   Resp (!) 30   LMP 06/02/2015   SpO2 94%  Physical Exam Vitals and nursing note reviewed.  Constitutional:      Appearance: She is not ill-appearing or toxic-appearing.  HENT:     Head: Normocephalic and atraumatic.     Mouth/Throat:     Mouth: Mucous membranes are moist.     Pharynx: No oropharyngeal exudate or posterior oropharyngeal erythema.  Eyes:     General: Lids are normal. Vision grossly intact.        Right eye: No discharge.        Left eye: No discharge.     Conjunctiva/sclera: Conjunctivae normal.     Pupils: Pupils are equal, round, and reactive to light.  Neck:     Trachea: Trachea and phonation normal.   Cardiovascular:     Rate and Rhythm: Normal rate and regular rhythm.     Pulses: Normal pulses.     Heart sounds: Murmur heard.  Pulmonary:     Effort: Pulmonary effort is normal. No tachypnea, bradypnea, accessory muscle usage, prolonged expiration or respiratory distress.     Breath sounds: Normal breath sounds. No wheezing or rales.  Chest:     Chest wall: No mass, lacerations, deformity, swelling, tenderness, crepitus or edema.  Abdominal:     General: Bowel sounds are normal. There is no distension.     Tenderness: There is abdominal tenderness in the right upper quadrant and epigastric area. There is no right CVA tenderness, left CVA tenderness, guarding or rebound.  Musculoskeletal:        General: No deformity.     Cervical back: Normal range of motion and neck supple.     Right lower leg: No edema.     Left lower leg: No edema.  Lymphadenopathy:     Cervical: No cervical adenopathy.  Skin:    General: Skin is warm and dry.       Neurological:     Mental Status: She is alert. Mental status is at baseline.  Psychiatric:        Mood and Affect: Mood normal.     ED Results / Procedures / Treatments   Labs (all labs ordered are  listed, but only abnormal results are displayed) Labs Reviewed  COMPREHENSIVE METABOLIC PANEL - Abnormal; Notable for the following components:      Result Value   Chloride 93 (*)    Glucose, Bld 103 (*)    BUN 40 (*)    Creatinine, Ser 9.65 (*)    Calcium 5.3 (*)    Albumin 3.2 (*)    Alkaline Phosphatase 235 (*)    GFR, Estimated 4 (*)    Anion gap 17 (*)    All other components within normal limits  CBC - Abnormal; Notable for the following components:   RBC 3.53 (*)    Hemoglobin 10.1 (*)    HCT 32.3 (*)  RDW 17.1 (*)    All other components within normal limits  I-STAT BETA HCG BLOOD, ED ([facility], [facility], [facility] ONLY) - Abnormal; Notable for the following components:   I-stat hCG, quantitative 7.6 (*)    All other components within normal limits  I-STAT CHEM 8, ED - Abnormal; Notable for the following components:   Sodium 133 (*)    Chloride 94 (*)    BUN 38 (*)    Creatinine, Ser 10.70 (*)    Glucose, Bld 103 (*)    Calcium, Ion 0.54 (*)    Hemoglobin 10.9 (*)    HCT 32.0 (*)    All other components within normal limits  LIPASE, BLOOD  POC OCCULT BLOOD, ED    EKG EKG Interpretation  Date/Time:  Thursday March 20 2022 22:43:00 EDT Ventricular Rate:  102 PR Interval:  152 QRS Duration: 80 QT Interval:  409 QTC Calculation: 533 R Axis:   0 Text Interpretation: Sinus tachycardia Probable left atrial enlargement Minimal ST depression, diffuse leads Prolonged QT interval Confirmed by Ripley Fraise [phone]) on 03/20/2022 11:17:30 PM  Radiology US Abdomen Limited RUQ (LIVER/GB)  Result Date: 03/20/2022 CLINICAL DATA:  Right upper quadrant pain. EXAM: ULTRASOUND ABDOMEN LIMITED RIGHT UPPER QUADRANT COMPARISON:  10/08/2019. FINDINGS: Gallbladder: No gallstones or wall thickening visualized. No sonographic Murphy sign noted by sonographer. Common bile duct: Diameter: 2.1 mm Liver: No focal lesion identified. Within normal limits in parenchymal echogenicity. Portal vein is patent  on color Doppler imaging with normal direction of blood flow towards the liver. Other: No free fluid. Increased parenchymal echogenicity is noted in the right kidney. IMPRESSION: 1. No cholelithiasis or acute cholecystitis. 2. Increased parenchymal echogenicity in the right kidney suggesting medical renal disease. Electronically Signed   By: Brett Fairy M.D.   On: 03/20/2022 23:38    Procedures .Critical Care  Performed by: Emeline Darling, PA-C Authorized by: Emeline Darling, PA-C   Critical care provider statement:    Critical care time (minutes):  45   Critical care was time spent personally by me on the following activities:  Development of treatment plan with patient or surrogate, discussions with consultants, evaluation of patient's response to treatment, examination of patient, obtaining history from patient or surrogate, ordering and performing treatments and interventions, ordering and review of laboratory studies, ordering and review of radiographic studies, pulse oximetry and re-evaluation of patient's condition     Medications Ordered in ED Medications  pantoprazole (PROTONIX) injection 40 mg (has no administration in time range)  calcium gluconate 2 g/ 100 mL sodium chloride IVPB (has no administration in time range)  calcitRIOL (ROCALTROL) capsule 0.5 mcg (has no administration in time range)  calcium carbonate (TUMS - dosed in mg elemental calcium) chewable tablet 1,000 mg of elemental calcium (has no administration in time range)  fentaNYL (SUBLIMAZE) injection 50 mcg (has no administration in time range)    ED Course/ Medical Decision Making/ A&P Clinical Course as of 03/21/22 0027  Thu Mar 20, 2022  2258 Corrected calcium 5.54 [RS]  2322 Consult to Dr. Johnney Ou, nephrology, who recommends IV repletion with Calcium gluconate 1-2 g IV initially, starting patient on Calcitriol 0.5 mcg TID and Tums 1 g QID. Also recommending frequent rechecks. I appreciate her  collaboration in the care of this patient.   [RS]  Fri Mar 21, 2022  0025 Consult to [place], Internal medicine teaching service, who is agreeable to admitting her to their service. I appreciate his collaboration in the care of  this patient.  [RS]    Clinical Course User Index [RS] Solae Norling, Gypsy Balsam, PA-C                           Medical Decision Making 56 year old female presents with concern for epigastric pain.  Hypertensive on intake, vitals otherwise normal.  Cardiopulmonary and abdominal exams as above, reassuring.  Nominal exam with epigastric and right upper quadrant tenderness palpation.  Neurovascular intact in all extremities.  Well-appearing recent surgical incisions, Dermabond intact over top of wounds.  AV fistula in the left upper extremity with palpable thrill.   Differential diagnosis of epigastric pain includes but is not limited to: Functional or nonulcer dyspepsia (MCC), PUD, GERD, Gastritis, (NSAIDs, alcohol, stress, H. pylori, pernicious anemia), pancreatitis / pancreatic cancer, overeating indigestion (high-fat foods, coffee), drugs (aspirin, antibiotics (eg, macrolides, metronidazole), corticosteroids, digoxin, narcotics, theophylline), gastroparesis, gastric volvulus, gastric cancer, lactose intolerance, malabsorption, parasitic infection (Giardia, Strongyloides, Ascaris), abdominal hernia, intestinal ischemia, esophageal rupture,  cholelithiasis /choledocholithiasis / cholangitis, hepatitis, ACS, pericarditis, pneumonia, pregnancy.   Amount and/or Complexity of Data Reviewed Labs: ordered.    Details: CBC with anemia with hemoglobin of 10.1 patient's baseline.  CMP with low chloride to 93 creatinine at patient's baseline of 9 context of ESRD on HD.  Critical low calcium of 5.3, albumin 3.2, corrected calcium 5.5.  Anion gap of 17 and elevated alk phos to 235. Radiology: ordered.    Details: Right upper quadrant ultrasound visualized this provider negative for  acute biliary abnormality. ECG/medicine tests:     Details: EKG with sinus tachycardia with rate of 102 with prolonged QT with QTc of 533  Risk OTC drugs. Prescription drug management. Decision regarding hospitalization.   Consult to nephrology as above.  Patient will require medical admission for critical low calcium with Qtc prolongation.  Consult pending to internal medicine team at this time.  Consult to internal medicine as above, Ylonda and her parents  voiced understanding of her medical evaluation and treatment plan. Each of their questions answered to their expressed satisfaction.  They are amenable to plan for admission at this time.   This chart was dictated using voice recognition software, Dragon. Despite the best efforts of this provider to proofread and correct errors, errors may still occur which can change documentation meaning.   Final Clinical Impression(s) / ED Diagnoses Final diagnoses:  Hypocalcemia    Rx / DC Orders ED Discharge Orders     None         Aura Dials 03/21/22 9675    Margette Fast, MD 03/24/22 1620

## 2022-03-20 NOTE — Progress Notes (Signed)
Was called into patient's room by her daughter. Patient asked me to remove IV because she was leaving AMA. I paged [facility] to let them know.

## 2022-03-20 NOTE — ED Triage Notes (Addendum)
Patient with epigastric abdominal pain since an 1 hour ago. Pt describes it as throbbing. Patient reports feeling nauseated. Patient is a dialysis patient, access to the left, receives treatments mwf. Patient just had parathyroid surgery and was discharged this AM.

## 2022-03-20 NOTE — ED Provider Triage Note (Signed)
Emergency Medicine Provider Triage Evaluation Note  Ruth Gutierrez , a 56 y.o. female  was evaluated in triage.  Pt complains of diffuse abdominal pain with diarrhea since yesterday. Reports some nausea without vomiting. Denies any fevers. Recent parathyroid surgery on Tuesday. MWF dialysis. Went to dialysis yesterday and sat for full session.   Review of Systems  Positive:  Negative:   Physical Exam  BP (!) 178/92 (BP Location: Right Arm)   Pulse 98   Temp 98.9 F (37.2 C) (Oral)   Resp 20   LMP 06/02/2015   SpO2 100%  Gen:   Awake, no distress   Resp:  Normal effort  MSK:   Moves extremities without difficulty  Other:  Diffuse abdominal tenderness without guarding or rebound. Surgical incision on neck appears to be intact.   Medical Decision Making  Medically screening exam initiated at 5:39 PM.  Appropriate orders placed.  [name] was informed that the remainder of the evaluation will be completed by another provider, this initial triage assessment does not replace that evaluation, and the importance of remaining in the ED until their evaluation is complete.  Labs ordered.    Sherrell Puller, [place] 03/20/22 1741

## 2022-03-20 NOTE — Progress Notes (Signed)
Removed IV-CDI. AMA paperwork signed and put in the chart. Patient's daughter/friend wheeled patient off the floor.

## 2022-03-20 NOTE — Telephone Encounter (Signed)
     I came to make rounds on my patient this morning at 10AM.  I went to hemodialysis unit and patient was not there.  Apparently had HD yesterday according to staff.  I came to 6N to see patient.  Patient not in room 26.  I reviewed chart in Epic and found out that patient had signed out AMA this morning.  I was not aware.  Will try to contact patient from my office and arrange follow up.  Will notify nephrology service.  Daniels, MD [facility] [place] [facility] Office: [phone]

## 2022-03-21 ENCOUNTER — Inpatient Hospital Stay (HOSPITAL_COMMUNITY): Payer: Medicare Other

## 2022-03-21 DIAGNOSIS — Z841 Family history of disorders of kidney and ureter: Secondary | ICD-10-CM | POA: Diagnosis not present

## 2022-03-21 DIAGNOSIS — Z79899 Other long term (current) drug therapy: Secondary | ICD-10-CM | POA: Diagnosis not present

## 2022-03-21 DIAGNOSIS — Z833 Family history of diabetes mellitus: Secondary | ICD-10-CM | POA: Diagnosis not present

## 2022-03-21 DIAGNOSIS — E890 Postprocedural hypothyroidism: Secondary | ICD-10-CM | POA: Diagnosis present

## 2022-03-21 DIAGNOSIS — N2581 Secondary hyperparathyroidism of renal origin: Secondary | ICD-10-CM | POA: Diagnosis not present

## 2022-03-21 DIAGNOSIS — J440 Chronic obstructive pulmonary disease with acute lower respiratory infection: Secondary | ICD-10-CM | POA: Diagnosis present

## 2022-03-21 DIAGNOSIS — Z8249 Family history of ischemic heart disease and other diseases of the circulatory system: Secondary | ICD-10-CM | POA: Diagnosis not present

## 2022-03-21 DIAGNOSIS — Z83438 Family history of other disorder of lipoprotein metabolism and other lipidemia: Secondary | ICD-10-CM | POA: Diagnosis not present

## 2022-03-21 DIAGNOSIS — F17210 Nicotine dependence, cigarettes, uncomplicated: Secondary | ICD-10-CM | POA: Diagnosis not present

## 2022-03-21 DIAGNOSIS — Y830 Surgical operation with transplant of whole organ as the cause of abnormal reaction of the patient, or of later complication, without mention of misadventure at the time of the procedure: Secondary | ICD-10-CM | POA: Diagnosis present

## 2022-03-21 DIAGNOSIS — I120 Hypertensive chronic kidney disease with stage 5 chronic kidney disease or end stage renal disease: Secondary | ICD-10-CM | POA: Diagnosis not present

## 2022-03-21 DIAGNOSIS — Z880 Allergy status to penicillin: Secondary | ICD-10-CM | POA: Diagnosis not present

## 2022-03-21 DIAGNOSIS — R079 Chest pain, unspecified: Secondary | ICD-10-CM | POA: Diagnosis not present

## 2022-03-21 DIAGNOSIS — N250 Renal osteodystrophy: Secondary | ICD-10-CM | POA: Diagnosis present

## 2022-03-21 DIAGNOSIS — R9389 Abnormal findings on diagnostic imaging of other specified body structures: Secondary | ICD-10-CM | POA: Diagnosis not present

## 2022-03-21 DIAGNOSIS — T8612 Kidney transplant failure: Secondary | ICD-10-CM | POA: Diagnosis not present

## 2022-03-21 DIAGNOSIS — D631 Anemia in chronic kidney disease: Secondary | ICD-10-CM | POA: Diagnosis not present

## 2022-03-21 DIAGNOSIS — Z992 Dependence on renal dialysis: Secondary | ICD-10-CM | POA: Diagnosis not present

## 2022-03-21 DIAGNOSIS — F32A Depression, unspecified: Secondary | ICD-10-CM | POA: Diagnosis not present

## 2022-03-21 DIAGNOSIS — N186 End stage renal disease: Secondary | ICD-10-CM | POA: Diagnosis not present

## 2022-03-21 DIAGNOSIS — K219 Gastro-esophageal reflux disease without esophagitis: Secondary | ICD-10-CM | POA: Diagnosis present

## 2022-03-21 DIAGNOSIS — E860 Dehydration: Secondary | ICD-10-CM | POA: Diagnosis present

## 2022-03-21 DIAGNOSIS — Z86011 Personal history of benign neoplasm of the brain: Secondary | ICD-10-CM | POA: Diagnosis not present

## 2022-03-21 DIAGNOSIS — R109 Unspecified abdominal pain: Secondary | ICD-10-CM | POA: Diagnosis not present

## 2022-03-21 DIAGNOSIS — J189 Pneumonia, unspecified organism: Secondary | ICD-10-CM | POA: Diagnosis present

## 2022-03-21 DIAGNOSIS — K259 Gastric ulcer, unspecified as acute or chronic, without hemorrhage or perforation: Secondary | ICD-10-CM | POA: Diagnosis not present

## 2022-03-21 DIAGNOSIS — E892 Postprocedural hypoparathyroidism: Secondary | ICD-10-CM | POA: Diagnosis not present

## 2022-03-21 DIAGNOSIS — I10 Essential (primary) hypertension: Secondary | ICD-10-CM | POA: Diagnosis not present

## 2022-03-21 LAB — CBC
HCT: 33.5 % — ABNORMAL LOW (ref 36.0–46.0)
Hemoglobin: 10.7 g/dL — ABNORMAL LOW (ref 12.0–15.0)
MCH: 29.2 pg (ref 26.0–34.0)
MCHC: 31.9 g/dL (ref 30.0–36.0)
MCV: 91.3 fL (ref 80.0–100.0)
Platelets: 163 10*3/uL (ref 150–400)
RBC: 3.67 MIL/uL — ABNORMAL LOW (ref 3.87–5.11)
RDW: 17.1 % — ABNORMAL HIGH (ref 11.5–15.5)
WBC: 8.2 10*3/uL (ref 4.0–10.5)
nRBC: 0 % (ref 0.0–0.2)

## 2022-03-21 LAB — COMPREHENSIVE METABOLIC PANEL
ALT: 19 U/L (ref 0–44)
AST: 23 U/L (ref 15–41)
Albumin: 3.2 g/dL — ABNORMAL LOW (ref 3.5–5.0)
Alkaline Phosphatase: 278 U/L — ABNORMAL HIGH (ref 38–126)
Anion gap: 19 — ABNORMAL HIGH (ref 5–15)
BUN: 44 mg/dL — ABNORMAL HIGH (ref 6–20)
CO2: 22 mmol/L (ref 22–32)
Calcium: 5.4 mg/dL — CL (ref 8.9–10.3)
Chloride: 94 mmol/L — ABNORMAL LOW (ref 98–111)
Creatinine, Ser: 10.39 mg/dL — ABNORMAL HIGH (ref 0.44–1.00)
GFR, Estimated: 4 mL/min — ABNORMAL LOW (ref >60)
Glucose, Bld: 94 mg/dL (ref 70–99)
Potassium: 4.3 mmol/L (ref 3.5–5.1)
Sodium: 135 mmol/L (ref 135–145)
Total Bilirubin: 1.1 mg/dL (ref 0.3–1.2)
Total Protein: 7.5 g/dL (ref 6.5–8.1)

## 2022-03-21 LAB — RENAL FUNCTION PANEL
Albumin: 2.8 g/dL — ABNORMAL LOW (ref 3.5–5.0)
Anion gap: 16 — ABNORMAL HIGH (ref 5–15)
BUN: 41 mg/dL — ABNORMAL HIGH (ref 6–20)
CO2: 24 mmol/L (ref 22–32)
Calcium: 5.3 mg/dL — CL (ref 8.9–10.3)
Chloride: 96 mmol/L — ABNORMAL LOW (ref 98–111)
Creatinine, Ser: 9.95 mg/dL — ABNORMAL HIGH (ref 0.44–1.00)
GFR, Estimated: 4 mL/min — ABNORMAL LOW (ref >60)
Glucose, Bld: 111 mg/dL — ABNORMAL HIGH (ref 70–99)
Phosphorus: 3.9 mg/dL (ref 2.5–4.6)
Potassium: 4.4 mmol/L (ref 3.5–5.1)
Sodium: 136 mmol/L (ref 135–145)

## 2022-03-21 LAB — HCG, QUANTITATIVE, PREGNANCY: hCG, Beta Chain, Quant, S: 8 m[IU]/mL — ABNORMAL HIGH (ref <5)

## 2022-03-21 LAB — HEPATITIS B SURFACE ANTIBODY, QUANTITATIVE: Hep B S AB Quant (Post): 21.6 m[IU]/mL

## 2022-03-21 LAB — CALCIUM, IONIZED: Calcium, Ionized, Serum: <3 mg/dL — ABNORMAL LOW (ref 4.5–5.6)

## 2022-03-21 MED ORDER — CHLORHEXIDINE GLUCONATE CLOTH 2 % EX PADS
6.0000 | MEDICATED_PAD | Freq: Every day | CUTANEOUS | Status: DC
  Administered 2022-03-22 – 2022-03-23 (×2): 6 via TOPICAL

## 2022-03-21 MED ORDER — PANTOPRAZOLE SODIUM 40 MG IV SOLR
40.0000 mg | INTRAVENOUS | Status: DC
Start: 2022-03-21 — End: 2022-03-21

## 2022-03-21 MED ORDER — ANTICOAGULANT SODIUM CITRATE 4% (200MG/5ML) IV SOLN: 5.0000 mL | Status: DC | PRN

## 2022-03-21 MED ORDER — HEPARIN SODIUM (PORCINE) 5000 UNIT/ML IJ SOLN
5000.0000 [IU] | Freq: Three times a day (TID) | INTRAMUSCULAR | Status: DC
  Administered 2022-03-21 – 2022-03-22 (×2): 5000 [IU] via SUBCUTANEOUS
  Filled 2022-03-21 (×12): qty 1

## 2022-03-21 MED ORDER — ACETAMINOPHEN 325 MG PO TABS
650.0000 mg | ORAL_TABLET | Freq: Four times a day (QID) | ORAL | Status: DC | PRN
  Administered 2022-03-21: 650 mg via ORAL
  Filled 2022-03-21: qty 2

## 2022-03-21 MED ORDER — PANTOPRAZOLE SODIUM 40 MG IV SOLR
40.0000 mg | Freq: Two times a day (BID) | INTRAVENOUS | Status: DC
  Administered 2022-03-21 – 2022-03-23 (×5): 40 mg via INTRAVENOUS
  Filled 2022-03-21 (×5): qty 10

## 2022-03-21 MED ORDER — VANCOMYCIN HCL 750 MG/150ML IV SOLN: 750.0000 mg | INTRAVENOUS | Status: DC

## 2022-03-21 MED ORDER — HEPARIN SODIUM (PORCINE) 1000 UNIT/ML DIALYSIS: 1000.0000 [IU] | INTRAMUSCULAR | Status: DC | PRN

## 2022-03-21 MED ORDER — NICOTINE 14 MG/24HR TD PT24
14.0000 mg | MEDICATED_PATCH | Freq: Every day | TRANSDERMAL | Status: DC
  Filled 2022-03-21 (×5): qty 1

## 2022-03-21 MED ORDER — SUCRALFATE 1 GM/10ML PO SUSP
1.0000 g | Freq: Three times a day (TID) | ORAL | Status: DC
  Administered 2022-03-21 – 2022-03-25 (×17): 1 g via ORAL
  Filled 2022-03-21 (×23): qty 10

## 2022-03-21 MED ORDER — ALTEPLASE 2 MG IJ SOLR
2.0000 mg | Freq: Once | INTRAMUSCULAR | Status: DC | PRN
Start: 2022-03-21 — End: 2022-03-21

## 2022-03-21 MED ORDER — CARVEDILOL 25 MG PO TABS
25.0000 mg | ORAL_TABLET | Freq: Two times a day (BID) | ORAL | Status: DC
  Administered 2022-03-21 – 2022-03-28 (×13): 25 mg via ORAL
  Filled 2022-03-21: qty 1
  Filled 2022-03-21: qty 2
  Filled 2022-03-21 (×11): qty 1

## 2022-03-21 MED ORDER — CALCIUM GLUCONATE-NACL 2-0.675 GM/100ML-% IV SOLN
2.0000 g | Freq: Once | INTRAVENOUS | Status: AC
Start: 2022-03-21 — End: 2022-03-21
  Administered 2022-03-21: 2000 mg via INTRAVENOUS
  Filled 2022-03-21: qty 100

## 2022-03-21 MED ORDER — HEPARIN SODIUM (PORCINE) 5000 UNIT/ML IJ SOLN: 5000.0000 [IU] | Freq: Three times a day (TID) | INTRAMUSCULAR | Status: DC

## 2022-03-21 MED ORDER — VANCOMYCIN HCL 1500 MG/300ML IV SOLN
1500.0000 mg | Freq: Once | INTRAVENOUS | Status: AC
  Administered 2022-03-22: 1500 mg via INTRAVENOUS
  Filled 2022-03-21: qty 300

## 2022-03-21 MED ORDER — SODIUM CHLORIDE 0.9 % IV SOLN
2.0000 g | Freq: Every day | INTRAVENOUS | Status: DC
  Administered 2022-03-21: 2 g via INTRAVENOUS
  Filled 2022-03-21 (×2): qty 20

## 2022-03-21 MED ORDER — ACETAMINOPHEN 650 MG RE SUPP: 650.0000 mg | Freq: Four times a day (QID) | RECTAL | Status: DC | PRN

## 2022-03-21 MED ORDER — RAMELTEON 8 MG PO TABS
8.0000 mg | ORAL_TABLET | Freq: Every day | ORAL | Status: AC
Start: 2022-03-21 — End: 2022-03-22
  Administered 2022-03-22: 8 mg via ORAL
  Filled 2022-03-21 (×3): qty 1

## 2022-03-21 MED ORDER — PENTAFLUOROPROP-TETRAFLUOROETH EX AERO: 1.0000 | INHALATION_SPRAY | CUTANEOUS | Status: DC | PRN

## 2022-03-21 MED ORDER — ALBUTEROL SULFATE (2.5 MG/3ML) 0.083% IN NEBU: 2.5000 mg | INHALATION_SOLUTION | Freq: Every day | RESPIRATORY_TRACT | Status: DC | PRN

## 2022-03-21 MED ORDER — AMLODIPINE BESYLATE 10 MG PO TABS
10.0000 mg | ORAL_TABLET | Freq: Every day | ORAL | Status: DC
  Administered 2022-03-21 – 2022-03-27 (×8): 10 mg via ORAL
  Filled 2022-03-21: qty 2
  Filled 2022-03-21 (×8): qty 1

## 2022-03-21 MED ORDER — HYDROMORPHONE HCL 1 MG/ML IJ SOLN
0.5000 mg | INTRAMUSCULAR | Status: DC | PRN
  Administered 2022-03-21: 1 mg via INTRAVENOUS
  Administered 2022-03-21: 0.5 mg via INTRAVENOUS
  Administered 2022-03-21: 1 mg via INTRAVENOUS
  Administered 2022-03-21: 0.5 mg via INTRAVENOUS
  Administered 2022-03-21 – 2022-03-28 (×22): 1 mg via INTRAVENOUS
  Filled 2022-03-21 (×27): qty 1

## 2022-03-21 MED ORDER — DICLOFENAC SODIUM 1 % EX GEL: 2.0000 g | CUTANEOUS | Status: DC | PRN

## 2022-03-21 MED ORDER — FENTANYL CITRATE PF 50 MCG/ML IJ SOSY
50.0000 ug | PREFILLED_SYRINGE | Freq: Once | INTRAMUSCULAR | Status: AC
  Administered 2022-03-21: 50 ug via INTRAVENOUS
  Filled 2022-03-21: qty 1

## 2022-03-21 MED ORDER — LIDOCAINE-PRILOCAINE 2.5-2.5 % EX CREA: 1.0000 | TOPICAL_CREAM | CUTANEOUS | Status: DC | PRN

## 2022-03-21 MED ORDER — SERTRALINE HCL 50 MG PO TABS
50.0000 mg | ORAL_TABLET | Freq: Every day | ORAL | Status: DC
  Administered 2022-03-21 – 2022-03-28 (×7): 50 mg via ORAL
  Filled 2022-03-21 (×10): qty 1

## 2022-03-21 MED ORDER — LIDOCAINE HCL (PF) 1 % IJ SOLN: 5.0000 mL | INTRAMUSCULAR | Status: DC | PRN

## 2022-03-21 MED ORDER — SODIUM CHLORIDE 0.9 % IV SOLN: 1.0000 g | INTRAVENOUS | Status: DC

## 2022-03-21 NOTE — Consult Note (Signed)
[facility] Renal Consultation Note    Indication for Consultation:  Management of ESRD/hemodialysis; anemia, hypertension/volume and secondary hyperparathyroidism  WUJ:[id number], Alease Frame, DO  HPI: Ruth Gutierrez is a 56 y.o. female with ESRD on HD MWF [place].  Past medical history significant for failed renal transplant, Hx meningioma s/p resection 11/01/20, Hx gastric ulcer, HTN, anemia of CKD and secondary hyperparathyroidism s/p parathyroidectomy on 03/18/22.   Patient left hospital AMA following parathyroidectomy on 03/18/22.  Returned to ED 1 day ago due to generalized weakness, abdominal pain and diarrhea.  Patient seen and examined in dialysis.  Reports abdominal pain now improved.  Denies numbness/tingling in extremities and around mouth.  No CP, n/v, dizziness, weakness or fatigue. Admits to shortness of breath.    Pertinent findings in work up include hypertension, tachypnea, tachycardia, Ca 5.4, ionized Ca 0.54 and abdominal US with no cholelithiasis or acute cholecystitis. Patient has been admitted for further evaluation and management.   Past Medical History:  Diagnosis Date   Anemia of chronic disease    Arthritis    Deceased-donor kidney transplant    Performed at [facility], April 2010.  Initial ESRD due to HTN nephropathy   Eczema    ESRD (end stage renal disease) ([place])    M/W/F dialysis   FUO (fever of unknown origin) 05/17/2015   GERD (gastroesophageal reflux disease)    Headache(784.0)    History of hyperparathyroidism    Hypertension    Peritonitis ([place]) 10/2019   Shortness of breath    Wears glasses    Past Surgical History:  Procedure Laterality Date   A/V FISTULAGRAM Left 02/12/2017   Procedure: A/V Fistulagram;  Surgeon: Algernon Huxley, MD;  Location: [place] CV [facility];  Service: Cardiovascular;  Laterality: Left;   A/V FISTULAGRAM Left 12/30/2017   Procedure: A/V FISTULAGRAM;  Surgeon: Algernon Huxley, MD;  Location: [place] [facility];   Service: Cardiovascular;  Laterality: Left;   A/V FISTULAGRAM Left 04/01/2021   Procedure: A/V FISTULAGRAM;  Surgeon: Algernon Huxley, MD;  Location: [place] CV LAB;  Service: Cardiovascular;  Laterality: Left;   A/V SHUNT INTERVENTION N/A 02/12/2017   Procedure: A/V Shunt Intervention;  Surgeon: Algernon Huxley, MD;  Location: [place] [facility];  Service: Cardiovascular;  Laterality: N/A;   AV FISTULA PLACEMENT     BASCILIC VEIN TRANSPOSITION Left 10/30/2014   Procedure: LEFT [place];  Surgeon: Rosetta Posner, MD;  Location: [place];  Service: Vascular;  Laterality: Left;   [place] Left 01/03/2015   Procedure: LEFT ARM 2ND STAGE [place];  Surgeon: Rosetta Posner, MD;  Location: [place];  Service: Vascular;  Laterality: Left;   BIOPSY  10/23/2021   Procedure: BIOPSY;  Surgeon: Jackquline Denmark, MD;  [facility]: [facility] ENDOSCOPY;  Service: Gastroenterology;;   BREAST BIOPSY Left    Patient doesn't remember any information from previous procedure.   CAPD REMOVAL N/A 10/31/2019   Procedure: PERITONEAL DIALYSIS  (CAPD) INFECTED CATHETER REMOVAL;  Surgeon: Coralie Keens, MD;  Location: [place];  Service: General;  Laterality: N/A;   CRANIOTOMY N/A 11/01/2020   Procedure: CRANIOTOMY FOR TUMOR EXCISION;  Surgeon: [name], MD;  Location: [place];  Service: Neurosurgery;  Laterality: N/A;   ESOPHAGOGASTRODUODENOSCOPY (EGD) WITH PROPOFOL N/A 10/23/2021   Procedure: ESOPHAGOGASTRODUODENOSCOPY (EGD) WITH PROPOFOL;  Surgeon: Jackquline Denmark, MD;  Location: [place];  Service: Gastroenterology;  Laterality: N/A;   FRACTURE SURGERY     left foot,baby  toe nad next toe missing   INSERTION OF DIALYSIS CATHETER Right 10/30/2014   Procedure: INSERTION OF DIALYSIS CATHETER;  Surgeon: Rosetta Posner, MD;  Location: [place];  Service: Vascular;  Laterality: Right;   KIDNEY TRANSPLANT  11/02/2008   [facility])   PARATHYROIDECTOMY N/A 03/18/2022    Procedure: TOTAL PARATHYROIDECTOMY;  Surgeon: Armandina Gemma, MD;  Location: [place];  Service: General;  Laterality: N/A;   PLACEMENT OF LUMBAR DRAIN N/A 11/01/2020   Procedure: PLACEMENT OF LUMBAR DRAIN;  Surgeon: [name], MD;  Location: [place];  Service: Neurosurgery;  Laterality: N/A;   WISDOM TOOTH EXTRACTION     Family History  Problem Relation Age of Onset   Hypertension Mother    Hypertension Father    Hypertension Sister    Hyperlipidemia Sister    Hypertension Sister    Diabetes Brother    Deep vein thrombosis Brother    Kidney disease Brother        on HD   Colon cancer Neg Hx    Esophageal cancer Neg Hx    Rectal cancer Neg Hx    Breast cancer Neg Hx    Social History:  reports that she has been smoking cigarettes. She has a 13.50 pack-year smoking history. She has never used smokeless tobacco. She reports that she does not drink alcohol and does not use drugs. Allergies  Allergen Reactions   Penicillins Itching and Rash    Did it involve swelling of the face/tongue/throat, SOB, or low BP?Y Did it involve sudden or severe rash/hives, skin peeling, or any reaction on the inside of your mouth or nose? Y Did you need to seek medical attention at a hospital or doctor's office? Y When did it last happen?  2016     If all above answers are "NO", may proceed with cephalosporin use.   Prior to Admission medications   Medication Sig Start Date End Date Taking? Authorizing Provider  acetaminophen (TYLENOL) 325 MG tablet Take 325 mg by mouth daily as needed (pain). Take with 500 mg of Tylenol   Yes [provider]  acetaminophen (TYLENOL) 500 MG tablet Take 500 mg by mouth daily as needed (pain). Take with 325 mg of Tylenol   Yes [provider]  albuterol (VENTOLIN HFA) 108 (90 Base) MCG/ACT inhaler Inhale 3 puffs into the lungs daily as needed for wheezing or shortness of breath. 11/12/21 03/21/22 Yes Gaylan Gerold, DO  amLODipine (NORVASC) 10 MG tablet  Take 1 tablet (10 mg total) by mouth daily. 07/02/21  Yes Lajean Manes, MD  carvedilol (COREG) 25 MG tablet Take 1 tablet (25 mg total) by mouth 2 (two) times daily. 11/07/21 11/07/22 Yes Gaylan Gerold, DO  diclofenac Sodium (VOLTAREN) 1 % GEL Apply 2 g topically 2 (two) times daily as needed (Right shoulder pain). Patient taking differently: Apply 1 Application topically as needed (Right shoulder pain). 01/20/22  Yes Gaylan Gerold, DO  diphenhydrAMINE (BENADRYL) 25 mg capsule Take 50 mg by mouth 3 (three) times daily as needed for allergies.   Yes [provider]  lidocaine-prilocaine (EMLA) cream Apply 1 application. topically every Monday, Wednesday, and Friday with hemodialysis. 02/01/20  Yes [provider]  sertraline (ZOLOFT) 50 MG tablet Take 1 tablet (50 mg total) by mouth daily. 07/02/21 07/02/22 Yes Lajean Manes, MD  sevelamer carbonate (RENVELA) 800 MG tablet Take 1,600 mg by mouth See admin instructions. Take 1600 mg by mouth 1-2 times a day with meals. 10/07/21  Yes [provider]  cloNIDine (CATAPRES) 0.1 MG tablet Take 0.1 mg by mouth 2 (two) times daily. Patient not taking: Reported on 03/19/2022 01/03/22   [provider]  ondansetron (ZOFRAN-ODT) 4 MG disintegrating tablet Take 1 tablet (4 mg total) by mouth every 8 (eight) hours as needed for nausea or vomiting. Patient not taking: Reported on 03/19/2022 10/24/21   Rosezetta Schlatter, MD  pantoprazole (PROTONIX) 40 MG tablet Take 1 tablet (40 mg total) by mouth 2 (two) times daily before a meal. Patient not taking: Reported on 03/19/2022 10/24/21 02/13/22  Rosezetta Schlatter, MD  Polyethylene Glycol 400 (BLINK TEARS OP) Place 1 drop into both eyes daily as needed (tired eyes). Patient not taking: Reported on 03/19/2022    [provider]   Current Facility-Administered Medications  Medication Dose Route Frequency Provider Last Rate Last Admin   acetaminophen (TYLENOL) tablet 650 mg  650 mg Oral Q6H PRN  Rick Duff, MD       Or   acetaminophen (TYLENOL) suppository 650 mg  650 mg Rectal Q6H PRN Rick Duff, MD       albuterol (PROVENTIL) (2.5 MG/3ML) 0.083% nebulizer solution 2.5 mg  2.5 mg Inhalation Daily PRN Rick Duff, MD       amLODipine (NORVASC) tablet 10 mg  10 mg Oral Daily Rick Duff, MD   10 mg at 03/21/22 0157   calcitRIOL (ROCALTROL) capsule 0.5 mcg  0.5 mcg Oral TID Sponseller, Rebekah R, PA-C   0.5 mcg at 03/21/22 0049   calcium carbonate (TUMS - dosed in mg elemental calcium) chewable tablet 1,000 mg of elemental calcium  1,000 mg of elemental calcium Oral QID Sponseller, Rebekah R, PA-C   1,000 mg of elemental calcium at 03/21/22 1014   carvedilol (COREG) tablet 25 mg  25 mg Oral BID WC Rick Duff, MD   25 mg at 03/21/22 0815   Chlorhexidine Gluconate Cloth 2 % PADS 6 each  6 each Topical Q0600 Gean Quint, MD       diclofenac Sodium (VOLTAREN) 1 % topical gel 2 g  2 g Topical PRN Rick Duff, MD       heparin injection 5,000 Units  5,000 Units Subcutaneous Q8H Angelica Pou, MD       HYDROmorphone (DILAUDID) injection 0.5-1 mg  0.5-1 mg Intravenous Q2H PRN Rick Duff, MD   0.5 mg at 03/21/22 1112   nicotine (NICODERM CQ - dosed in mg/24 hours) patch 14 mg  14 mg Transdermal Daily Rick Duff, MD       pantoprazole (PROTONIX) injection 40 mg  40 mg Intravenous Q12H Rick Duff, MD   40 mg at 03/21/22 1018   ramelteon (ROZEREM) tablet 8 mg  8 mg Oral QHS Rick Duff, MD       sertraline (ZOLOFT) tablet 50 mg  50 mg Oral Daily Rick Duff, MD   50 mg at 03/21/22 1014   sucralfate (CARAFATE) 1 GM/10ML suspension 1 g  1 g Oral TID WC & HS Rick Duff, MD   1 g at 03/21/22 1013   Current Outpatient Medications  Medication Sig Dispense Refill   acetaminophen (TYLENOL) 325 MG tablet Take 325 mg by mouth daily as needed (pain). Take with 500 mg of Tylenol     acetaminophen (TYLENOL) 500 MG tablet Take  500 mg by mouth daily as needed (pain). Take with 325 mg of Tylenol     albuterol (VENTOLIN HFA) 108 (90 Base) MCG/ACT inhaler Inhale 3 puffs into the lungs daily as needed  for wheezing or shortness of breath. 18 g 0   amLODipine (NORVASC) 10 MG tablet Take 1 tablet (10 mg total) by mouth daily. 90 tablet 2   carvedilol (COREG) 25 MG tablet Take 1 tablet (25 mg total) by mouth 2 (two) times daily. 180 tablet 3   diclofenac Sodium (VOLTAREN) 1 % GEL Apply 2 g topically 2 (two) times daily as needed (Right shoulder pain). (Patient taking differently: Apply 1 Application topically as needed (Right shoulder pain).) 100 g 0   diphenhydrAMINE (BENADRYL) 25 mg capsule Take 50 mg by mouth 3 (three) times daily as needed for allergies.     lidocaine-prilocaine (EMLA) cream Apply 1 application. topically every Monday, Wednesday, and Friday with hemodialysis.     sertraline (ZOLOFT) 50 MG tablet Take 1 tablet (50 mg total) by mouth daily. 90 tablet 2   sevelamer carbonate (RENVELA) 800 MG tablet Take 1,600 mg by mouth See admin instructions. Take 1600 mg by mouth 1-2 times a day with meals.     cloNIDine (CATAPRES) 0.1 MG tablet Take 0.1 mg by mouth 2 (two) times daily. (Patient not taking: Reported on 03/19/2022)     ondansetron (ZOFRAN-ODT) 4 MG disintegrating tablet Take 1 tablet (4 mg total) by mouth every 8 (eight) hours as needed for nausea or vomiting. (Patient not taking: Reported on 03/19/2022) 20 tablet 0   pantoprazole (PROTONIX) 40 MG tablet Take 1 tablet (40 mg total) by mouth 2 (two) times daily before a meal. (Patient not taking: Reported on 03/19/2022) 112 tablet 1   Polyethylene Glycol 400 (BLINK TEARS OP) Place 1 drop into both eyes daily as needed (tired eyes). (Patient not taking: Reported on 03/19/2022)     Labs: Basic Metabolic Panel: Recent Labs  Lab 03/19/22 0132 03/20/22 0716 03/20/22 1753 03/20/22 1810 03/21/22 0249  NA 138 136 136 133* 135  K 4.4 3.8 4.0 4.0 4.3  CL 96* 96* 93*  94* 94*  CO2 '26 25 26  '$ --  22  GLUCOSE 109* 83 103* 103* 94  BUN 41* 38* 40* 38* 44*  CREATININE 8.76* 8.78* 9.65* 10.70* 10.39*  CALCIUM 7.0* 5.3* 5.3*  --  5.4*  PHOS 6.8*  6.6* 4.1  --   --   --    Liver Function Tests: Recent Labs  Lab 03/20/22 0716 03/20/22 1753 03/21/22 0249  AST  --  22 23  ALT  --  17 19  ALKPHOS  --  235* 278*  BILITOT  --  0.6 1.1  PROT  --  7.3 7.5  ALBUMIN 2.9* 3.2* 3.2*   Recent Labs  Lab 03/20/22 1753  LIPASE 34   CBC: Recent Labs  Lab 03/19/22 0132 03/20/22 1753 03/20/22 1810 03/21/22 0249  WBC 7.5 7.3  --  8.2  HGB 10.2* 10.1* 10.9* 10.7*  HCT 33.1* 32.3* 32.0* 33.5*  MCV 93.8 91.5  --  91.3  PLT 156 158  --  163   Studies/Results: US Abdomen Limited RUQ (LIVER/GB)  Result Date: 03/20/2022 CLINICAL DATA:  Right upper quadrant pain. EXAM: ULTRASOUND ABDOMEN LIMITED RIGHT UPPER QUADRANT COMPARISON:  10/08/2019. FINDINGS: Gallbladder: No gallstones or wall thickening visualized. No sonographic Murphy sign noted by sonographer. Common bile duct: Diameter: 2.1 mm Liver: No focal lesion identified. Within normal limits in parenchymal echogenicity. Portal vein is patent on color Doppler imaging with normal direction of blood flow towards the liver. Other: No free fluid. Increased parenchymal echogenicity is noted in the right kidney. IMPRESSION: 1. No cholelithiasis or  acute cholecystitis. 2. Increased parenchymal echogenicity in the right kidney suggesting medical renal disease. Electronically Signed   By: Brett Fairy M.D.   On: 03/20/2022 23:38    ROS: All others negative except those listed in HPI.  Physical Exam: Vitals:   03/21/22 0535 03/21/22 0600 03/21/22 1125 03/21/22 1130  BP: (!) 147/89 (!) 188/92 133/78   Pulse: (!) 107  96   Resp: 20 (!) 24 (!) 21   Temp: 99.5 F (37.5 C)   99.1 F (37.3 C)  TempSrc: Oral   Oral  SpO2: 96%  98%      General: chronically ill appearing female in NAD Head: NCAT sclera not icteric  MMM Neck: Supple. No lymphadenopathy Lungs: +scattered wheezing, nml WOB on RA Heart: RRR. No murmur, rubs or gallops.  Abdomen: soft, nontender, +BS, no guarding, no rebound tenderness Lower extremities:no edema, ischemic changes, or open wounds  Neuro: AAOx3. Moves all extremities spontaneously. Psych:  Responds to questions appropriately with a normal affect. Dialysis Access: LU AVF in use  Dialysis Orders:   MWF [place]  4h 400/AF 1.5  67 kg   2K/3Ca bath  AVF  LUA   Hep none  - hectorol 2 ug IV tiw  - mircera 225 q2wk last given 8/2  Assessment/Plan:  Hypocalcemia - Ca 5.3, iCa 0.53 on admit now improved to 1.06. 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  Given 2g IV calcium gluconate in ED.  Started CaCO3 1g QID and calcitriol 0.75mg TID.  Check Ca q4hrs.   ESRD -  on HD MWF.  HD today per regular schedule.    Hypertension/volume  - BP initially elevated, and now improved.  Does not appear volume overloaded, UF as tolerated.   Anemia of CKD - Hgb 10.7. No indication for ESA at this time.   Secondary Hyperparathyroidism - hypocalcemia as noted above with recent PTX.  Supplements as above.  Check phos.   Nutrition - Renal diet w/fluid restrictions.  Hx meningioma s/p resection 11/01/20  LJen Mow PA-C [facility] 03/21/2022, 11:47 AM

## 2022-03-21 NOTE — Progress Notes (Signed)
Patient admitted from HD . Patient moaning during transfer. Put patient in bed, vitals were taken and noted temp 101.1. HR 107. O2 sat 88%. O2 started at 2 LPM, tylenol given,prn pain med given. Patient is alert and oriented. Oriented to orient and staff. Md made aware via IM messaging regardig his temp. Awaiting reply.

## 2022-03-21 NOTE — Progress Notes (Signed)
Pharmacy Antibiotic Note  Ruth Gutierrez is a 56 y.o. female admitted on 03/20/2022 with sepsis.  Pharmacy has been consulted for vancomycin dosing.  Plan: Patient is ESRD HD dependant. Administer a load dose of 1500 mg iv vanco X1 now followed-by 750 mg iv after every HD session (Monday, Wednesday, Thursday) Monitor levels at steady state.  Assess micro reports as they become available and de-escalate therapy as opportunities present.  Follow- up with length of therapy  Weight: 63.5 kg (139 lb 15.9 oz)  Temp (24hrs), Avg:100.1 F (37.8 C), Min:98.8 F (37.1 C), Max:101.6 F (38.7 C)  Recent Labs  Lab 03/19/22 0132 03/20/22 0716 03/20/22 1753 03/20/22 1810 03/21/22 0249 03/21/22 1233  WBC 7.5  --  7.3  --  8.2  --   CREATININE 8.76* 8.78* 9.65* 10.70* 10.39* 9.95*    Estimated Creatinine Clearance: 6.3 mL/min (A) (by C-G formula based on SCr of 9.95 mg/dL (H)).    Allergies  Allergen Reactions   Penicillins Itching and Rash    Did it involve swelling of the face/tongue/throat, SOB, or low BP?Y Did it involve sudden or severe rash/hives, skin peeling, or any reaction on the inside of your mouth or nose? Y Did you need to seek medical attention at a hospital or doctor's office? Y When did it last happen?  2016     If all above answers are "NO", may proceed with cephalosporin use.    Antimicrobials this admission: 8/18 vanco >>   8/18 CTX >>    Microbiology results: No micro's reported at the time of this note  Thank you for allowing pharmacy to be a part of this patient's care.  Vaughan Basta BS, PharmD, BCPS Clinical Pharmacist 03/21/2022 9:47 PM  Contact: [phone] after 3 PM  "Be curious, not judgmental..." -Jamal Maes

## 2022-03-21 NOTE — ED Notes (Signed)
Patient placed on 2L [place].

## 2022-03-21 NOTE — H&P (Cosign Needed Addendum)
Date: 03/21/2022               Patient Name:  Ruth Gutierrez MRN: [id number]  DOB: March 06, 1966 Age / Sex: 56 y.o., female   PCP: Gaylan Gerold, DO         Medical Service: Internal Medicine Teaching Service         Attending Physician: Dr. Jimmye Norman, Elaina Pattee, MD    First Contact: Dr. Tonna Corner, MD  Pager: [phone]  Second Contact: Dr. Sanjuan Dame, MD Pager: [phone]        After Hours (After 5p/  First Contact Pager: [phone]  weekends / holidays): Second Contact Pager: [phone]   Chief Complaint: Abd pain/diarrhea   History of Present Illness:   Ruth Gutierrez is a 56 yo F with a PMH of ESRD 2/2 uncontrolled HTN on HD MWF c/b hyperparathyroidism s/p recent total parathyroidectomy and ACD, s/p renal transplant, HTN, COPD, meningioma s/p resection 10/2020.   She presents to the ED after recent total parathyroidectomy with one day of persistent epigastric pain, diarrhea, and nausea.   History is provided by patient and her mother who is at bedside.   Patient left the hospital early on the day of admission and was in her usual state of health until the early afternoon when she developed abdominal pain in her epigastrium. She also notes that she had diarrhea (~3 bowel movements). She did not mention that they were bloody to me, but the ED note states that she did have at least one dark stool. She also notes some numbness and tingling in her hands and feet which are new. She denies any sick contacts, fevers, chest pain, or shortness of breath.   Of note, she did complete full HD session the day prior to admission.     PMH:  ESRD on HD d/t blood pressure on HD MWF, Dr. Shane Crutch nephrologist.  Secondary hyperparathyroidism  HTN ? COPD no PFTs. Uses albuterol inhaler sparingly ~3 to 4 x over last 2 weeks  UGIB 10/2021 2/2 Gastric ulcer, pt to have repeat EGD + Colonoscopy in July. This has not been scheduled yet  Meningioma   PSH:   Meningioma S/p Bifrontal  craniotomy including cranialization of frontal sinus for resection of skull base mass 11/01/2020  S/p total parathyroidectomy w; autotransplantation to R brachioradialis 03/18/2022  SH:  Lives with daughter  Uses a walker and a cane to get around  0.5ppd smoker for 38 years  No alchol use  No other drugs   FH:  HTN in dad and mom No cancers no diabetes    Allergies: Unsure if allergic to penicillin   Meds:  Current Meds  Medication Sig   acetaminophen (TYLENOL) 325 MG tablet Take 325 mg by mouth daily as needed (pain). Take with 500 mg of Tylenol   acetaminophen (TYLENOL) 500 MG tablet Take 500 mg by mouth daily as needed (pain). Take with 325 mg of Tylenol   albuterol (VENTOLIN HFA) 108 (90 Base) MCG/ACT inhaler Inhale 3 puffs into the lungs daily as needed for wheezing or shortness of breath.   amLODipine (NORVASC) 10 MG tablet Take 1 tablet (10 mg total) by mouth daily.   carvedilol (COREG) 25 MG tablet Take 1 tablet (25 mg total) by mouth 2 (two) times daily.   diclofenac Sodium (VOLTAREN) 1 % GEL Apply 2 g topically 2 (two) times daily as needed (Right shoulder pain). (Patient taking differently: Apply 1 Application topically as needed (Right shoulder  pain).)   diphenhydrAMINE (BENADRYL) 25 mg capsule Take 50 mg by mouth 3 (three) times daily as needed for allergies.   lidocaine-prilocaine (EMLA) cream Apply 1 application. topically every Monday, Wednesday, and Friday with hemodialysis.   sertraline (ZOLOFT) 50 MG tablet Take 1 tablet (50 mg total) by mouth daily.   sevelamer carbonate (RENVELA) 800 MG tablet Take 1,600 mg by mouth See admin instructions. Take 1600 mg by mouth 1-2 times a day with meals.     Allergies: Allergies as of 03/20/2022 - Review Complete 03/20/2022  Allergen Reaction Noted   Penicillins Itching and Rash 02/02/2013   Past Medical History:  Diagnosis Date   Anemia of chronic disease    Arthritis    Deceased-donor kidney transplant    Performed at  [facility], April 2010.  Initial ESRD due to HTN nephropathy   Eczema    ESRD (end stage renal disease) ([place])    M/W/F dialysis   FUO (fever of unknown origin) 05/17/2015   GERD (gastroesophageal reflux disease)    Headache(784.0)    History of hyperparathyroidism    Hypertension    Peritonitis ([place]) 10/2019   Shortness of breath    Wears glasses     Family History:  Family History  Problem Relation Age of Onset   Hypertension Mother    Hypertension Father    Hypertension Sister    Hyperlipidemia Sister    Hypertension Sister    Diabetes Brother    Deep vein thrombosis Brother    Kidney disease Brother        on HD   Colon cancer Neg Hx    Esophageal cancer Neg Hx    Rectal cancer Neg Hx    Breast cancer Neg Hx      Review of Systems: A complete ROS was negative except as per HPI.   Physical Exam: Blood pressure (!) 188/108, pulse (!) 104, temperature 98.8 F (37.1 C), temperature source Oral, resp. rate (!) 30, last menstrual period 06/02/2015, SpO2 94 %.  Constitutional: Uncomfortable, chronically ill appearing  HENT:  Neck: surgical incision intact w/ no drainage or surrounding erythema  Head: Normocephalic and atraumatic.  Eyes: EOM are normal.   Cardiovascular: Normal rate, regular rhythm, intact distal pulses. No gallop and no friction rub.  No murmur heard. No lower extremity edema  LUE fistula w/ palpable thrill Pulmonary: Non labored breathing on room air, wheezing in LUL, no rales  Abdominal: Soft. Normal bowel sounds. Non distended, mildly tender to deep palpation in the epigastrium  Musculoskeletal: Normal range of motion.     Neurological: Alert and oriented to person, place, and time. Non focal  Skin: Skin is warm and dry. Surgical incision on R forearm, c/d/I no erythema or drainage    EKG: personally reviewed my interpretation is NSR, minimal st depression diffusely, Qtc prolongation   Assessment & Plan by Problem: Principal Problem:    Hypocalcemia Ruth Gutierrez is a 56 yo F with PMH of HTN, ESRD on HD MWF c/b secondary hyperparathyroidism s/p recent total parathyroidectomy who presents with a few hour history of severe epigastric pain and diarrhea who was found to be severely hypocalcemic.    #Symptomatic hypocalcemia  #secondary hyperparathyroidism s/p total thyroidectomy Patient recently underwent total parathyroidectomy 2 days prior to admission for secondary hyperparathyroidism. Her last calcium was 7.0 before patient left AMA. At that time she was asymptomatic. She eventually developed persistent abdominal pain and some loose stools thought to be secondary to her severely low  calcium levels. On presentation her Ca was 5.3 and her EKG had Qtc prolongation worsened from previous EKGs c/w hypocalcemial Nephrology was consulted in the ED and gave their recommendations for calcium supplementation. She received IV calcium 2g, 1g of calcium carbonate, and 0.35mg calcitriol. Patient's abdominal could also be due to her gastric ulcer -Continue calcium supplementation with calcium carbonate 1g QID, continue calcitriol.  -F/u AM Ca -F/u nephrology recommendations for home calcium supplementation -PPI BID + carafate, has outpatient follow up with GI for EGD/colonoscopy but has not been set up  -Ensure patient has f/u with Dr. GPamalee Leydenon HD MWF #ACD Nephrology following. Resume home HD schedule. ESA and iron as needed  -F/u iron panel   #UGIB 10/2021 2/2 gastric ulcer Continue protonix and carafate. Patient in the process of being scheduled for repeat EGD/colonoscopy with GI per 8/17 note in the chart.   #HTN Elevated on admission. Resume home amlodipine '10mg'$  qd, coreg '25mg'$  bid  #?COPD Long smoking history, no recent CT chest. No PFTs. Has an albuterol inhaler which she uses sparingly about 2-4x over the last 2 weeks. She was noted to have some wheezing on exam.  -PRN albuterol nebs for wheezing   #History of  meningioma  #Persistent elevated Beta HCG Resected in 11/01/2020. Per chart review it does not appear that she has had follow up since discharge. She has had no neurological deficits but continues to have a persistently elevated beta hcg that was first noted around 2020. It was thought that this was related to her meningioma.  -Will repeat Beta HCG test with different serum assay  #Isolated elevated ALP  Likely due to renal osteodystrophy.  -CTM   Dispo: Admit patient to Observation with expected length of stay less than 2 midnights.  Signed: CRick Duff MD 03/21/2022, 2:28 AM  After 5pm on weekdays and 1pm on weekends: On Call pager: [phone]

## 2022-03-21 NOTE — Progress Notes (Signed)
Pt receives out-pt HD at [facility] on MWF. Pt has a 5:50 am chair time. Will assist as needed.   Melven Sartorius Renal Navigator [phone]

## 2022-03-21 NOTE — Hospital Course (Addendum)
[name] is a 56 yo F with a PMH of ESRD 2/2 uncontrolled HTN on HD MWF c/b hyperparathyroidism s/p recent total parathyroidectomy and ACD, s/p renal transplant, HTN, COPD, meningioma s/p resection 10/2020.   She presents to the ED after recent total parathyroidectomy with one day of persistent epigastric pain, diarrhea, and nausea.   History is provided by patient and her mother who is at bedside.   Patient left the hospital early on the day of admission and was in her usual state of health until the early afternoon when she developed abdominal pain in her epigastrium. She also notes that she had diarrhea (~3 bowel movements). She did not mention that they were bloody to me, but the ED note states that she did have at least one dark stool. She denies any sick contacts, fevers, chest pain, or shortness of breath.   Of note, she did complete full HD session the day prior to admission.     PMH:  ESRD on HD d/t blood pressure on HD MWF, Dr. Shane Gutierrez nephrologist.  Secondary hyperparathyroidism  HTN ?Asthma, possibly COPD no PFTs. Uses albuterol inhaler sparingly ~3 to 4 x over last 2 weeks  UGIB 10/2021 2/2 Gastric ulcer, pt to have repeat EGD + Colonoscopy in July. This has not been scheduled yet  Meningioma   PSH:   Meningioma S/p Bifrontal craniotomy including cranialization of frontal sinus for resection of skull base mass 11/01/2020  S/p total parathyroidectomy w; autotransplantation to R brachioradialis 03/18/2022  SH:  Lives with daughter  Uses a walker and a cane to get around  0.5ppd smoker for 38 years  No alchol use  No other drugs   FH:  HTN in dad and mom No cancers no diabetes    Allergies: Unsure if allergic to penicillin

## 2022-03-21 NOTE — Progress Notes (Signed)
Subjective:   Summary: Ruth Gutierrez is a 56 y.o. year old female currently admitted on the IMTS HD#0 for hypocalcemia s/p total parathyroidectomy.  Overnight Events: NOE   Pt was seen today in the AM in the emergency department. She states she is feeling better, and has a good understanding of her treatment and plan. She denies any pain or muscle spasms.   Objective:  Vital signs in last 24 hours: Vitals:   03/21/22 1300 03/21/22 1330 03/21/22 1402 03/21/22 1430  BP: (!) 198/102 (!) 194/107 (!) 202/123 (!) 209/130  Pulse: 99 (!) 103 (!) 102 (!) 103  Resp: '17 19 17 14  '$ Temp:      TempSrc:      SpO2: 91% 93% 96% 96%  Weight:       Supplemental O2: Room Air SpO2: 96 %   Physical Exam:  Constitutional: well-appearing woman sitting in bed, in no acute distress Cardiovascular: RRR, no murmurs, rubs or gallops Pulmonary/Chest: normal work of breathing on room air, lungs clear to auscultation bilaterally Abdominal: soft, non-tender, non-distended Skin: warm and dry Extremities: upper/lower extremity pulses 2+, no lower extremity edema present  Filed Weights   03/21/22 1220  Weight: 66.9 kg     Intake/Output Summary (Last 24 hours) at 03/21/2022 1436 Last data filed at 03/21/2022 0951 Gross per 24 hour  Intake 100 ml  Output --  Net 100 ml   Net IO Since Admission: 100 mL [03/21/22 1436]  Pertinent Labs:    Latest Ref Rng & Units 03/21/2022    2:49 AM 03/20/2022    6:10 PM 03/20/2022    5:53 PM  CBC  WBC 4.0 - 10.5 K/uL 8.2   7.3   Hemoglobin 12.0 - 15.0 g/dL 10.7  10.9  10.1   Hematocrit 36.0 - 46.0 % 33.5  32.0  32.3   Platelets 150 - 400 K/uL 163   158        Latest Ref Rng & Units 03/21/2022   12:33 PM 03/21/2022    2:49 AM 03/20/2022    6:10 PM  CMP  Glucose 70 - 99 mg/dL 111  94  103   BUN 6 - 20 mg/dL 41  44  38   Creatinine 0.44 - 1.00 mg/dL 9.95  10.39  10.70   Sodium 135 - 145 mmol/L 136  135  133   Potassium 3.5 - 5.1  mmol/L 4.4  4.3  4.0   Chloride 98 - 111 mmol/L 96  94  94   CO2 22 - 32 mmol/L 24  22    Calcium 8.9 - 10.3 mg/dL 5.3  5.4    Total Protein 6.5 - 8.1 g/dL  7.5    Total Bilirubin 0.3 - 1.2 mg/dL  1.1    Alkaline Phos 38 - 126 U/L  278    AST 15 - 41 U/L  23    ALT 0 - 44 U/L  19      Imaging: US Abdomen Limited RUQ (LIVER/GB)  Result Date: 03/20/2022 CLINICAL DATA:  Right upper quadrant pain. EXAM: ULTRASOUND ABDOMEN LIMITED RIGHT UPPER QUADRANT COMPARISON:  10/08/2019. FINDINGS: Gallbladder: No gallstones or wall thickening visualized. No sonographic Murphy sign noted by sonographer. Common bile duct: Diameter: 2.1 mm Liver: No focal lesion identified. Within normal limits in parenchymal echogenicity. Portal vein is patent on color Doppler imaging with normal direction of blood flow towards the  liver. Other: No free fluid. Increased parenchymal echogenicity is noted in the right kidney. IMPRESSION: 1. No cholelithiasis or acute cholecystitis. 2. Increased parenchymal echogenicity in the right kidney suggesting medical renal disease. Electronically Signed   By: Brett Fairy M.D.   On: 03/20/2022 23:38    Assessment/Plan:   Principal Problem:   Hypocalcemia   Patient Summary: Ruth Gutierrez is a 56 y.o. with a pertinent PMH of ESRD on HD MWF, hyperparathyroidism s/p recent total parathyroidectomy, s/p renal transplant, HTN, COPD, and meningioma resection in 10/2020, who presented with nausea, diarrhea, and epigastric pain and admitted for hypocalcemia s/p total parathyroidectomy on the 15th of August.   #Hypocalcemia S/P Parathyroidectomy  Pt underwent Parathyroidectomy on 03/18/22 for treatment of secondary hyperparathyroidism due to ESRD. Calcium levels were found to be 5.3 in the ED upon admission. She states she left AMA because during her hospital stay when she went for dialysis the nurse left her there until 10 PM. She says she was frustrated, and decided to leave because she  was feeling well at that point. Chvostek's sign is negative, and she denies any muscle spasms upon examination. Nephrology is on board and is currently following.   Plan:  - Appreciate nephrology recommendations  - Currently on 2g of IV Calcium gluconate that was started in the ED. Started CaCO3 1g QID and calcitriol .48mg TID. Calcium checks Q4H -Phos checks    #ESRD Dialysis MWF, HD per normal schedule   #Hx of UGIB secondary to Gastric Ulcer  In March of this year, pt was hopsitalized for acute blood loss anemia secondary to Upper GI bleed, EGD was performed which showed a gastric ulcer. She was supposed to have a repeat EGD and Colonscopy in July, however was not compliant with prep and it was canceled. She has not rescheduled yet. Could be an explanation for GI symptoms she presented with.   Plan:  -Protonix and Carafate currently  -Outpatient GI follow up  Code: Full    Dispo: Anticipated discharge in less than two midnights.   SDrucie Opitz MD PGY-1 Internal Medicine Resident Pager Number [phone] contact the on call pager after 5 pm and on weekends at [phone]

## 2022-03-21 NOTE — Telephone Encounter (Signed)
Unable to reach Pt: Pt mailbox is full

## 2022-03-21 NOTE — ED Notes (Signed)
ED TO INPATIENT HANDOFF REPORT  ED Nurse Name and Phone #: Jhoel Stieg RN [phone]  S Name/Age/Gender Ruth Gutierrez 56 y.o. female Room/Bed: 003C/003C  Code Status   Code Status: Full Code  Home/SNF/Other Home Patient oriented to: self, place, time, and situation Is this baseline? Yes   Triage Complete: Triage complete  Chief Complaint Hypocalcemia [E83.51]  Triage Note Patient with epigastric abdominal pain since an 1 hour ago. Pt describes it as throbbing. Patient reports feeling nauseated. Patient is a dialysis patient, access to the left, receives treatments mwf. Patient just had parathyroid surgery and was discharged this AM.    Allergies Allergies  Allergen Reactions   Penicillins Itching and Rash    Did it involve swelling of the face/tongue/throat, SOB, or low BP?Y Did it involve sudden or severe rash/hives, skin peeling, or any reaction on the inside of your mouth or nose? Y Did you need to seek medical attention at a hospital or doctor's office? Y When did it last happen?  2016     If all above answers are "NO", may proceed with cephalosporin use.    Level of Care/Admitting Diagnosis ED Disposition     ED Disposition  Admit   Condition  --   Comment  Hospital Area: [place] [[id number]]  Level of Care: Med-Surg [16]  May place patient in observation at [facility] or [place] if equivalent level of care is available:: Yes  Covid Evaluation: Symptomatic Person Under Investigation (PUI) or recent exposure (last 10 days) *Testing Required*  Diagnosis: Hypocalcemia [275.41.ICD-9-CM]  Admitting Physician: Angelica Pou [place]  Attending Physician: Angelica Pou [[id number]]          B Medical/Surgery History Past Medical History:  Diagnosis Date   Anemia of chronic disease    Arthritis    Deceased-donor kidney transplant    Performed at [facility], April 2010.  Initial ESRD due to HTN nephropathy   Eczema    ESRD (end  stage renal disease) ([place])    M/W/F dialysis   FUO (fever of unknown origin) 05/17/2015   GERD (gastroesophageal reflux disease)    Headache(784.0)    History of hyperparathyroidism    Hypertension    Peritonitis ([place]) 10/2019   Shortness of breath    Wears glasses    Past Surgical History:  Procedure Laterality Date   A/V FISTULAGRAM Left 02/12/2017   Procedure: A/V Fistulagram;  Surgeon: Algernon Huxley, MD;  Location: [place] [facility];  Service: Cardiovascular;  Laterality: Left;   A/V FISTULAGRAM Left 12/30/2017   Procedure: A/V FISTULAGRAM;  Surgeon: Algernon Huxley, MD;  Location: [place] [facility];  Service: Cardiovascular;  Laterality: Left;   A/V FISTULAGRAM Left 04/01/2021   Procedure: A/V FISTULAGRAM;  Surgeon: Algernon Huxley, MD;  Location: [place] [facility];  Service: Cardiovascular;  Laterality: Left;   A/V SHUNT INTERVENTION N/A 02/12/2017   Procedure: A/V Shunt Intervention;  Surgeon: Algernon Huxley, MD;  Location: [place] [facility];  Service: Cardiovascular;  Laterality: N/A;   AV FISTULA PLACEMENT     BASCILIC VEIN TRANSPOSITION Left 10/30/2014   Procedure: LEFT [place];  Surgeon: Rosetta Posner, MD;  Location: [place];  Service: Vascular;  Laterality: Left;   [place] Left 01/03/2015   Procedure: LEFT ARM 2ND STAGE [place];  Surgeon: Rosetta Posner, MD;  Location: [place];  Service: Vascular;  Laterality: Left;   BIOPSY  10/23/2021  Procedure: BIOPSY;  Surgeon: Jackquline Denmark, MD;  Location: [facility] ENDOSCOPY;  Service: Gastroenterology;;   BREAST BIOPSY Left    Patient doesn't remember any information from previous procedure.   CAPD REMOVAL N/A 10/31/2019   Procedure: PERITONEAL DIALYSIS  (CAPD) INFECTED CATHETER REMOVAL;  Surgeon: Coralie Keens, MD;  Location: [place];  Service: General;  Laterality: N/A;   CRANIOTOMY N/A 11/01/2020   Procedure: CRANIOTOMY FOR TUMOR EXCISION;  Surgeon: [name], MD;   Location: [place];  Service: Neurosurgery;  Laterality: N/A;   ESOPHAGOGASTRODUODENOSCOPY (EGD) WITH PROPOFOL N/A 10/23/2021   Procedure: ESOPHAGOGASTRODUODENOSCOPY (EGD) WITH PROPOFOL;  Surgeon: Jackquline Denmark, MD;  Location: [place];  Service: Gastroenterology;  Laterality: N/A;   FRACTURE SURGERY     left foot,baby toe nad next toe missing   INSERTION OF DIALYSIS CATHETER Right 10/30/2014   Procedure: INSERTION OF DIALYSIS CATHETER;  Surgeon: Rosetta Posner, MD;  Location: [place];  Service: Vascular;  Laterality: Right;   KIDNEY TRANSPLANT  11/02/2008   [facility])   PARATHYROIDECTOMY N/A 03/18/2022   Procedure: TOTAL PARATHYROIDECTOMY;  Surgeon: Armandina Gemma, MD;  Location: [place];  Service: General;  Laterality: N/A;   PLACEMENT OF LUMBAR DRAIN N/A 11/01/2020   Procedure: PLACEMENT OF LUMBAR DRAIN;  Surgeon: [name], MD;  Location: [place];  Service: Neurosurgery;  Laterality: N/A;   WISDOM TOOTH EXTRACTION       A IV Location/Drains/Wounds Patient Lines/Drains/Airways Status     Active Line/Drains/Airways     Name Placement date Placement time Site Days   Peripheral IV 03/21/22 22 G 2.5" Anterior;Right Forearm 03/21/22  0036  Forearm  less than 1   Fistula / Graft Left Upper arm Arteriovenous fistula --  --  Upper arm  --   Fistula / Graft Left Upper arm Arteriovenous fistula 03/20/22  0148  Upper arm  1   Sheath 04/01/21 Left  04/01/21  0919  --  354   Incision (Closed) 10/31/19 Abdomen Right 10/31/19  0751  -- 872   Incision (Closed) 11/01/20 Back Other (Comment) 11/01/20  1522  -- 505   Incision (Closed) 11/01/20 Head 11/01/20  2103  -- 505   Incision (Closed) 03/18/22 Neck Other (Comment) 03/18/22  1600  -- 3   Incision (Closed) 03/18/22 Arm Right 03/18/22  1605  -- 3            Intake/Output Last 24 hours  Intake/Output Summary (Last 24 hours) at 03/21/2022 1115 Last data filed at 03/21/2022 6237 Gross per 24 hour  Intake 100 ml  Output --  Net 100  ml    Labs/Imaging Results for orders placed or performed during the hospital encounter of 03/20/22 (from the past 48 hour(s))  Lipase, blood     Status: None   Collection Time: 03/20/22  5:53 PM  Result Value Ref Range   Lipase 34 11 - 51 U/L    Comment: Performed at [facility], [place] [address] [place], [place] [address]  Comprehensive metabolic panel     Status: Abnormal   Collection Time: 03/20/22  5:53 PM  Result Value Ref Range   Sodium 136 135 - 145 mmol/L   Potassium 4.0 3.5 - 5.1 mmol/L   Chloride 93 (L) 98 - 111 mmol/L   CO2 26 22 - 32 mmol/L   Glucose, Bld 103 (H) 70 - 99 mg/dL    Comment: Glucose reference range applies only to samples taken after fasting for at least 8 hours.  BUN 40 (H) 6 - 20 mg/dL   Creatinine, Ser 9.65 (H) 0.44 - 1.00 mg/dL   Calcium 5.3 (LL) 8.9 - 10.3 mg/dL    Comment: CRITICAL RESULT CALLED TO, READ BACK BY AND VERIFIED WITH E.SHAFFER,RN '@[id number]'$  03/20/2022 VANG.J   Total Protein 7.3 6.5 - 8.1 g/dL   Albumin 3.2 (L) 3.5 - 5.0 g/dL   AST 22 15 - 41 U/L   ALT 17 0 - 44 U/L   Alkaline Phosphatase 235 (H) 38 - 126 U/L   Total Bilirubin 0.6 0.3 - 1.2 mg/dL   GFR, Estimated 4 (L) >60 mL/min    Comment: (NOTE) Calculated using the CKD-EPI Creatinine Equation (2021)    Anion gap 17 (H) 5 - 15    Comment: Performed at [facility] [address], [place], [place] [address]  CBC     Status: Abnormal   Collection Time: 03/20/22  5:53 PM  Result Value Ref Range   WBC 7.3 4.0 - 10.5 K/uL   RBC 3.53 (L) 3.87 - 5.11 MIL/uL   Hemoglobin 10.1 (L) 12.0 - 15.0 g/dL   HCT 32.3 (L) 36.0 - 46.0 %   MCV 91.5 80.0 - 100.0 fL   MCH 28.6 26.0 - 34.0 pg   MCHC 31.3 30.0 - 36.0 g/dL   RDW 17.1 (H) 11.5 - 15.5 %   Platelets 158 150 - 400 K/uL   nRBC 0.0 0.0 - 0.2 %    Comment: Performed at [facility], [place] [address], [place], [place] [address]  I-Stat beta hCG blood, ED     Status: Abnormal   Collection Time: 03/20/22  6:08 PM  Result  Value Ref Range   I-stat hCG, quantitative 7.6 (H) <5 mIU/mL   Comment 3            Comment:   GEST. AGE      CONC.  (mIU/mL)   <=1 WEEK        5 - 50     2 WEEKS       50 - 500     3 WEEKS       100 - 10,000     4 WEEKS     1,000 - 30,000        FEMALE AND NON-PREGNANT FEMALE:     LESS THAN 5 mIU/mL   I-stat chem 8, ED (not at [facility] or [facility])     Status: Abnormal   Collection Time: 03/20/22  6:10 PM  Result Value Ref Range   Sodium 133 (L) 135 - 145 mmol/L   Potassium 4.0 3.5 - 5.1 mmol/L   Chloride 94 (L) 98 - 111 mmol/L   BUN 38 (H) 6 - 20 mg/dL   Creatinine, Ser 10.70 (H) 0.44 - 1.00 mg/dL   Glucose, Bld 103 (H) 70 - 99 mg/dL    Comment: Glucose reference range applies only to samples taken after fasting for at least 8 hours.   Calcium, Ion 0.54 (LL) 1.15 - 1.40 mmol/L   TCO2 26 22 - 32 mmol/L   Hemoglobin 10.9 (L) 12.0 - 15.0 g/dL   HCT 32.0 (L) 36.0 - 46.0 %   Comment NOTIFIED PHYSICIAN   Comprehensive metabolic panel     Status: Abnormal   Collection Time: 03/21/22  2:49 AM  Result Value Ref Range   Sodium 135 135 - 145 mmol/L   Potassium 4.3 3.5 - 5.1 mmol/L   Chloride 94 (L) 98 - 111 mmol/L  CO2 22 22 - 32 mmol/L   Glucose, Bld 94 70 - 99 mg/dL    Comment: Glucose reference range applies only to samples taken after fasting for at least 8 hours.   BUN 44 (H) 6 - 20 mg/dL   Creatinine, Ser 10.39 (H) 0.44 - 1.00 mg/dL   Calcium 5.4 (LL) 8.9 - 10.3 mg/dL    Comment: CRITICAL RESULT CALLED TO, READ BACK BY AND VERIFIED WITH D.HOLLIS, RN 0544 08.18.23 MRIVET   Total Protein 7.5 6.5 - 8.1 g/dL   Albumin 3.2 (L) 3.5 - 5.0 g/dL   AST 23 15 - 41 U/L   ALT 19 0 - 44 U/L   Alkaline Phosphatase 278 (H) 38 - 126 U/L   Total Bilirubin 1.1 0.3 - 1.2 mg/dL   GFR, Estimated 4 (L) >60 mL/min    Comment: (NOTE) Calculated using the CKD-EPI Creatinine Equation (2021)    Anion gap 19 (H) 5 - 15    Comment: Performed at [facility], [place] [address] [place], [place] [address]   CBC     Status: Abnormal   Collection Time: 03/21/22  2:49 AM  Result Value Ref Range   WBC 8.2 4.0 - 10.5 K/uL   RBC 3.67 (L) 3.87 - 5.11 MIL/uL   Hemoglobin 10.7 (L) 12.0 - 15.0 g/dL   HCT 33.5 (L) 36.0 - 46.0 %   MCV 91.3 80.0 - 100.0 fL   MCH 29.2 26.0 - 34.0 pg   MCHC 31.9 30.0 - 36.0 g/dL   RDW 17.1 (H) 11.5 - 15.5 %   Platelets 163 150 - 400 K/uL   nRBC 0.0 0.0 - 0.2 %    Comment: Performed at [facility], [place] [address], [place], [place] [address]  hCG, quantitative, pregnancy     Status: Abnormal   Collection Time: 03/21/22  2:49 AM  Result Value Ref Range   hCG, Beta Chain, Quant, S 8 (H) <5 mIU/mL    Comment:          GEST. AGE      CONC.  (mIU/mL)   <=1 WEEK        5 - 50     2 WEEKS       50 - 500     3 WEEKS       100 - 10,000     4 WEEKS     1,000 - 30,000     5 WEEKS     3,500 - 115,000   6-8 WEEKS     12,000 - 270,000    12 WEEKS     15,000 - 220,000        FEMALE AND NON-PREGNANT FEMALE:     LESS THAN 5 mIU/mL Performed at [facility], [place] [address], [place], [place] [address]    US Abdomen Limited RUQ (LIVER/GB)  Result Date: 03/20/2022 CLINICAL DATA:  Right upper quadrant pain. EXAM: ULTRASOUND ABDOMEN LIMITED RIGHT UPPER QUADRANT COMPARISON:  10/08/2019. FINDINGS: Gallbladder: No gallstones or wall thickening visualized. No sonographic Murphy sign noted by sonographer. Common bile duct: Diameter: 2.1 mm Liver: No focal lesion identified. Within normal limits in parenchymal echogenicity. Portal vein is patent on color Doppler imaging with normal direction of blood flow towards the liver. Other: No free fluid. Increased parenchymal echogenicity is noted in the right kidney. IMPRESSION: 1. No cholelithiasis or acute cholecystitis. 2. Increased parenchymal echogenicity in the right kidney suggesting medical renal disease. Electronically Signed  By: Brett Fairy M.D.   On: 03/20/2022 23:38    Pending Labs Unresulted Labs (From admission, onward)      Start     Ordered   03/21/22 0700  Iron and TIBC  Once,   R        03/21/22 0659   03/21/22 0700  Ferritin  Once,   R        03/21/22 0659   03/21/22 0651  Hepatitis B surface antigen  (New Admission Hemo Labs (Hepatitis B))  Once,   R        03/21/22 0656   03/21/22 0651  Hepatitis B surface antibody  (New Admission Hemo Labs (Hepatitis B))  Once,   R        03/21/22 0656   03/21/22 0651  Hepatitis B surface antibody,quantitative  (New Admission Hemo Labs (Hepatitis B))  Once,   R        03/21/22 0656   03/21/22 0651  Hepatitis B core antibody, total  (New Admission Hemo Labs (Hepatitis B))  Once,   R        03/21/22 0656   03/21/22 0651  Hepatitis C antibody  (New Admission Hemo Labs (Hepatitis B))  Once,   R        03/21/22 4401   Signed and Held  Renal function panel  Once,   R        Signed and Held   Signed and Held  CBC  Once,   R        Signed and Held            Vitals/Pain Today's Vitals   03/21/22 0600 03/21/22 0810 03/21/22 0852 03/21/22 1114  BP: (!) 188/92     Pulse:      Resp: (!) 24     Temp:      TempSrc:      SpO2:      PainSc:  '8  5  6     '$ Isolation Precautions No active isolations  Medications Medications  calcitRIOL (ROCALTROL) capsule 0.5 mcg (0.5 mcg Oral Not Given 03/21/22 1018)  calcium carbonate (TUMS - dosed in mg elemental calcium) chewable tablet 1,000 mg of elemental calcium (1,000 mg of elemental calcium Oral Given 03/21/22 1014)  acetaminophen (TYLENOL) tablet 650 mg (has no administration in time range)    Or  acetaminophen (TYLENOL) suppository 650 mg (has no administration in time range)  HYDROmorphone (DILAUDID) injection 0.5-1 mg (0.5 mg Intravenous Given 03/21/22 1112)  amLODipine (NORVASC) tablet 10 mg (10 mg Oral Given 03/21/22 0157)  carvedilol (COREG) tablet 25 mg (25 mg Oral Given 03/21/22 0815)  albuterol (PROVENTIL) (2.5 MG/3ML) 0.083% nebulizer solution 2.5 mg (has no administration in time range)  nicotine (NICODERM  CQ - dosed in mg/24 hours) patch 14 mg (14 mg Transdermal Patient Refused/Not Given 03/21/22 0309)  diclofenac Sodium (VOLTAREN) 1 % topical gel 2 g (has no administration in time range)  sertraline (ZOLOFT) tablet 50 mg (50 mg Oral Given 03/21/22 1014)  ramelteon (ROZEREM) tablet 8 mg (8 mg Oral Patient Refused/Not Given 03/21/22 0309)  sucralfate (CARAFATE) 1 GM/10ML suspension 1 g (1 g Oral Given 03/21/22 1013)  heparin injection 5,000 Units (5,000 Units Subcutaneous Not Given 03/21/22 0817)  Chlorhexidine Gluconate Cloth 2 % PADS 6 each (6 each Topical Not Given 03/21/22 0800)  pantoprazole (PROTONIX) injection 40 mg (40 mg Intravenous Given 03/21/22 1018)  pantoprazole (PROTONIX) injection 40 mg (40 mg Intravenous Given 03/21/22 0049)  calcium gluconate 2 g/ 100 mL sodium chloride IVPB (0 mg Intravenous Stopped 03/21/22 0156)  fentaNYL (SUBLIMAZE) injection 50 mcg (50 mcg Intravenous Given 03/21/22 0048)  calcium gluconate 2 g/ 100 mL sodium chloride IVPB (0 mg Intravenous Stopped 03/21/22 0951)    Mobility walks Low fall risk   Focused Assessments Renal Assessment Handoff:  Hemodialysis Schedule: Hemodialysis Schedule: Monday/Wednesday/Friday Last Hemodialysis date and time: Tuesday    Restricted appendage: left arm   R Recommendations: See Admitting Provider Note  Report given to: Page Spiro RN  Additional Notes:

## 2022-03-21 NOTE — Progress Notes (Signed)
Notified about fever 101.6.  Patient went to dialysis and came back around 630 and at that point she had a temperature of 101.1.  Tylenol was given and following vital check after 1 hour, patient had persistent fever of 101.6.  She was placed on 2 L nasal cannula because of dyspnea and O2 sat 88% after dialysis.  Upon my assessment, she endorses some productive cough which started recently as well as dyspnea after dialysis.  She does not have any chest pain, hemoptysis, recent nausea or vomiting.  She did have 1 episode of loose stools yesterday.  She does not produce urine.  She says that her abdominal pain has significantly improved over the course of the past day.  She only has mild pain in her lower abdomen now.  She additionally endorses pain in her bilateral lower extremities.  Pain is described as sharp and shooting pain with cramps.  She attributes this to her hypocalcemia.  Vitals: Temp 101.6, RR 20, pulse 107, BP 148/89, O2 sat close to 100% on 2 L nasal cannula.  Exam: General: Well-appearing female sitting in bed in no acute distress HEENT: Incision from recent surgery clean dry and intact without surrounding erythema or tenderness to palpation. Cardiac: Tachycardic, regular rhythm, no murmurs rubs or gallops Respiratory: Mild rhonchi, slightly diminished breath sounds around bilateral lung bases.  Normal work of breathing on 2 L nasal cannula Abdomen: Soft, nondistended, mild tenderness to palpation in lower abdominal quadrants.  No guarding or rebound. Extremities: Bilateral lower extremities warm, dry without edema.  Pulses 2+.  LUE fistula clean, covered with dressings after dialysis, without any tenderness to palpation or erythema with palpable thrill. Neuro: Alert and oriented x3, interactive and responds to questions, however seems to be uncertain about events over the past day, time.  No focal neurological deficits.  Patient with uptrending temperature throughout the day and new  onset fever.  Fistula and recent surgical incision appear intact without signs of infection.  Abdominal exam is unremarkable and seems to be improving from yesterday.  New supplemental oxygen requirement and endorsement of dyspnea with worsening productive cough concerning for pneumonia source of possible sepsis. - We will obtain blood cultures x2, lactic acid, procalcitonin, CBC - Start Vanco, ceftriaxone after obtaining cultures - Chest x-ray - Consider CT abdomen pelvis with contrast if abdominal exam worsens

## 2022-03-22 DIAGNOSIS — J189 Pneumonia, unspecified organism: Secondary | ICD-10-CM

## 2022-03-22 DIAGNOSIS — Z992 Dependence on renal dialysis: Secondary | ICD-10-CM

## 2022-03-22 DIAGNOSIS — N186 End stage renal disease: Secondary | ICD-10-CM

## 2022-03-22 LAB — CALCIUM
Calcium: 5.5 mg/dL — CL (ref 8.9–10.3)
Calcium: 4.9 mg/dL — CL (ref 8.9–10.3)
Calcium: 4.9 mg/dL — CL (ref 8.9–10.3)
Calcium: 5.8 mg/dL — CL (ref 8.9–10.3)

## 2022-03-22 LAB — IRON AND TIBC
Iron: 22 ug/dL — ABNORMAL LOW (ref 28–170)
Saturation Ratios: 15 % (ref 10.4–31.8)
TIBC: 146 ug/dL — ABNORMAL LOW (ref 250–450)
UIBC: 124 ug/dL

## 2022-03-22 LAB — PROCALCITONIN: Procalcitonin: 1.73 ng/mL

## 2022-03-22 LAB — CBC
HCT: 28.8 % — ABNORMAL LOW (ref 36.0–46.0)
Hemoglobin: 9.5 g/dL — ABNORMAL LOW (ref 12.0–15.0)
MCH: 29.2 pg (ref 26.0–34.0)
MCHC: 33 g/dL (ref 30.0–36.0)
MCV: 88.6 fL (ref 80.0–100.0)
Platelets: 201 10*3/uL (ref 150–400)
RBC: 3.25 MIL/uL — ABNORMAL LOW (ref 3.87–5.11)
RDW: 16.8 % — ABNORMAL HIGH (ref 11.5–15.5)
WBC: 7.6 10*3/uL (ref 4.0–10.5)
nRBC: 0 % (ref 0.0–0.2)

## 2022-03-22 LAB — MRSA NEXT GEN BY PCR, NASAL: MRSA by PCR Next Gen: NOT DETECTED

## 2022-03-22 LAB — HEPATITIS B CORE ANTIBODY, TOTAL: Hep B Core Total Ab: NONREACTIVE

## 2022-03-22 LAB — HEPATITIS B SURFACE ANTIBODY,QUALITATIVE: Hep B S Ab: REACTIVE — AB

## 2022-03-22 LAB — HEPATITIS C ANTIBODY: HCV Ab: NONREACTIVE

## 2022-03-22 LAB — HEPATITIS B SURFACE ANTIGEN: Hepatitis B Surface Ag: NONREACTIVE

## 2022-03-22 LAB — FERRITIN: Ferritin: 1105 ng/mL — ABNORMAL HIGH (ref 11–307)

## 2022-03-22 LAB — LACTIC ACID, PLASMA: Lactic Acid, Venous: 1.4 mmol/L (ref 0.5–1.9)

## 2022-03-22 MED ORDER — SODIUM CHLORIDE 0.9 % IV BOLUS
500.0000 mL | Freq: Once | INTRAVENOUS | Status: AC
  Administered 2022-03-22: 500 mL via INTRAVENOUS

## 2022-03-22 MED ORDER — CALCITRIOL 0.5 MCG PO CAPS
1.5000 ug | ORAL_CAPSULE | Freq: Every day | ORAL | Status: AC
  Administered 2022-03-22: 1.5 ug via ORAL
  Filled 2022-03-22: qty 3

## 2022-03-22 MED ORDER — ACETAMINOPHEN 500 MG PO TABS
1000.0000 mg | ORAL_TABLET | Freq: Three times a day (TID) | ORAL | Status: DC
  Administered 2022-03-22 – 2022-03-28 (×17): 1000 mg via ORAL
  Filled 2022-03-22 (×18): qty 2

## 2022-03-22 MED ORDER — NEPRO/CARBSTEADY PO LIQD
237.0000 mL | Freq: Three times a day (TID) | ORAL | Status: DC
  Administered 2022-03-22 – 2022-03-28 (×8): 237 mL via ORAL

## 2022-03-22 MED ORDER — CALCIUM CARBONATE ANTACID 500 MG PO CHEW
1400.0000 mg | CHEWABLE_TABLET | Freq: Four times a day (QID) | ORAL | Status: DC
  Administered 2022-03-22 – 2022-03-23 (×4): 1400 mg via ORAL
  Filled 2022-03-22 (×7): qty 7

## 2022-03-22 MED ORDER — SODIUM CHLORIDE 0.9 % IV SOLN
3.0000 g | Freq: Four times a day (QID) | INTRAVENOUS | Status: DC
  Administered 2022-03-22 – 2022-03-23 (×3): 3 g via INTRAVENOUS
  Filled 2022-03-22 (×7): qty 30

## 2022-03-22 MED ORDER — CALCIUM GLUCONATE-NACL 2-0.675 GM/100ML-% IV SOLN
2.0000 g | Freq: Once | INTRAVENOUS | Status: AC
  Administered 2022-03-22: 2000 mg via INTRAVENOUS
  Filled 2022-03-22: qty 100

## 2022-03-22 MED ORDER — PROCHLORPERAZINE EDISYLATE 10 MG/2ML IJ SOLN
10.0000 mg | Freq: Once | INTRAMUSCULAR | Status: AC | PRN
  Administered 2022-03-22: 10 mg via INTRAVENOUS
  Filled 2022-03-22: qty 2

## 2022-03-22 MED ORDER — CALCITRIOL 0.5 MCG PO CAPS
1.0000 ug | ORAL_CAPSULE | Freq: Two times a day (BID) | ORAL | Status: DC
  Administered 2022-03-23 – 2022-03-26 (×8): 1 ug via ORAL
  Filled 2022-03-22 (×10): qty 2

## 2022-03-22 MED ORDER — CALCITRIOL 0.5 MCG PO CAPS: 2.0000 ug | ORAL_CAPSULE | Freq: Every day | ORAL | Status: DC

## 2022-03-22 NOTE — Progress Notes (Addendum)
Ruth Gutierrez Progress Note   Subjective:   Patient seen and examined at bedside.  Vomiting since 3AM.  Reports everything she eats/drinks is coming back up.  Calcium dropped to 4.9 this AM.  Admits to mild paresthesia in fingers.  Febrile after HD yesterday, afebrile this AM.  Breathing better today.  Denies CP, SOB, abdominal pain and diarrhea.   Objective Vitals:   03/21/22 1820 03/21/22 1959 03/21/22 2300 03/22/22 0734  BP: (!) 179/101 (!) 148/89 130/81 (!) 153/92  Pulse: (!) 107 (!) 107 93 94  Resp: (!) '21 20 20 16  '$ Temp: (!) 101.1 F (38.4 C) (!) 101.6 F (38.7 C) 98.3 F (36.8 C) 99.2 F (37.3 C)  TempSrc: Oral Oral Oral Oral  SpO2: (!) 88% 100% 93% 94%  Weight:       Physical Exam General:chronically ill appearing female in NAD Heart:RRR, no mrg Lungs:+rhonchi in RLL, nml WOB on 2L O2 via [place] Abdomen:soft, NTND Extremities:no LE edema Dialysis Access: LU AVF   [facility] Weights   03/21/22 1220 03/21/22 1723  Weight: 66.9 kg 63.5 kg    Intake/Output Summary (Last 24 hours) at 03/22/2022 1121 Last data filed at 03/22/2022 0400 Gross per 24 hour  Intake 395.83 ml  Output 2.6 ml  Net 393.23 ml    Additional Objective Labs: Basic Metabolic Panel: Recent Labs  Lab 03/19/22 0132 03/20/22 0716 03/20/22 1753 03/20/22 1810 03/21/22 0249 03/21/22 1233 03/21/22 2327 03/22/22 0548  NA 138 136 136 133* 135 136  --   --   K 4.4 3.8 4.0 4.0 4.3 4.4  --   --   CL 96* 96* 93* 94* 94* 96*  --   --   CO2 '26 25 26  '$ --  22 24  --   --   GLUCOSE 109* 83 103* 103* 94 111*  --   --   BUN 41* 38* 40* 38* 44* 41*  --   --   CREATININE 8.76* 8.78* 9.65* 10.70* 10.39* 9.95*  --   --   CALCIUM 7.0* 5.3* 5.3*  --  5.4* 5.3* 5.5* 4.9*  PHOS 6.8*  6.6* 4.1  --   --   --  3.9  --   --    Liver Function Tests: Recent Labs  Lab 03/20/22 1753 03/21/22 0249 03/21/22 1233  AST 22 23  --   ALT 17 19  --   ALKPHOS 235* 278*  --   BILITOT 0.6 1.1  --   PROT 7.3 7.5   --   ALBUMIN 3.2* 3.2* 2.8*   Recent Labs  Lab 03/20/22 1753  LIPASE 34   CBC: Recent Labs  Lab 03/19/22 0132 03/20/22 1753 03/20/22 1810 03/21/22 0249 03/21/22 2327  WBC 7.5 7.3  --  8.2 7.6  HGB 10.2* 10.1* 10.9* 10.7* 9.5*  HCT 33.1* 32.3* 32.0* 33.5* 28.8*  MCV 93.8 91.5  --  91.3 88.6  PLT 156 158  --  163 201   Blood Culture    Component Value Date/Time   SDES BLOOD RIGHT HAND 03/21/2022 2340   SPECREQUEST  03/21/2022 2340    BOTTLES DRAWN AEROBIC ONLY Blood Culture results may not be optimal due to an inadequate volume of blood received in culture bottles   CULT  03/21/2022 2340    NO GROWTH < 12 HOURS Performed at [facility], [place] [address] [place], [place] [address]    REPTSTATUS PENDING 03/21/2022 2340    Iron Studies:  Recent Labs  03/21/22 2327  IRON 22*  TIBC 146*  FERRITIN 1,105*   Lab Results  Component Value Date   INR 1.4 (H) 10/31/2020   INR 1.35 03/09/2011   Studies/Results: DG CHEST PORT 1 VIEW  Result Date: 03/21/2022 CLINICAL DATA:  Abdominal pain. EXAM: PORTABLE CHEST 1 VIEW COMPARISON:  Chest radiograph dated 10/22/2021. FINDINGS: Left lung base atelectasis versus possible developing infiltrate. A small left pleural effusion may be present. There is mild diffuse interstitial prominence. No pneumothorax. Mild cardiomegaly. Degenerative changes of the spine. No acute osseous pathology. Vascular graft noted in the left axilla. IMPRESSION: 1. Left lung base atelectasis versus possible developing infiltrate. 2. Mild cardiomegaly. Electronically Signed   By: Anner Crete M.D.   On: 03/21/2022 21:40   US Abdomen Limited RUQ (LIVER/GB)  Result Date: 03/20/2022 CLINICAL DATA:  Right upper quadrant pain. EXAM: ULTRASOUND ABDOMEN LIMITED RIGHT UPPER QUADRANT COMPARISON:  10/08/2019. FINDINGS: Gallbladder: No gallstones or wall thickening visualized. No sonographic Murphy sign noted by sonographer. Common bile duct: Diameter: 2.1 mm  Liver: No focal lesion identified. Within normal limits in parenchymal echogenicity. Portal vein is patent on color Doppler imaging with normal direction of blood flow towards the liver. Other: No free fluid. Increased parenchymal echogenicity is noted in the right kidney. IMPRESSION: 1. No cholelithiasis or acute cholecystitis. 2. Increased parenchymal echogenicity in the right kidney suggesting medical renal disease. Electronically Signed   By: Brett Fairy M.D.   On: 03/20/2022 23:38    Medications:  calcium gluconate     cefTRIAXone (ROCEPHIN)  IV 2 g (03/21/22 2343)    amLODipine  10 mg Oral Daily   [START ON 03/23/2022] calcitRIOL  1 mcg Oral BID BM   calcium carbonate  1,400 mg of elemental calcium Oral QID   carvedilol  25 mg Oral BID WC   Chlorhexidine Gluconate Cloth  6 each Topical Q0600   heparin  5,000 Units Subcutaneous Q8H   nicotine  14 mg Transdermal Daily   pantoprazole (PROTONIX) IV  40 mg Intravenous Q12H   ramelteon  8 mg Oral QHS   sertraline  50 mg Oral Daily   sucralfate  1 g Oral TID WC & HS    Dialysis Orders: [facility]  4h 400/AF 1.5  67 kg   2K/3Ca bath  AVF  LUA   Hep none  - hectorol 2 ug IV tiw  - mircera 225 q2wk last given 8/2   Assessment/Plan:  Hypocalcemia -  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  Using 2.5 Ca bath w/HD as it is only available option.  Can increase Ca in bath on d/c.  Given 2g IV calcium gluconate in ED and started CaCO3 1g QID and calcitriol 0.86mg TID.  Ca 5.3, iCa 0.53 on admit, improved to 1.06. Ca drop to 4.9 this AM.  Given 2g IV Ca gluconate this AM and started 3g IV q6hrs until Ca.5.5-6.  Increased CaCO3 to 1.4g QID, increased Calcitriol to 139m BID.  Continue check Ca q4hrs. Continue to titrate meds until stabilized.  Fever - BC w/NGTD.  WBC WNL.  CXR with L lung atelectasis vs developing infiltrate, ?PNA.  ABX started.  ESRD -  on HD MWF.  HD today per regular  schedule.    Hypertension/volume  - BP variable. Does not appear volume overloaded, UF as tolerated. Under EDW if weights correct.   Anemia of CKD - Hgb 9.5.  Will order ESA with HD on  Monday.    Secondary Hyperparathyroidism - hypocalcemia as noted above with recent PTX.  Supplements as above.  Phos in goal.   Nutrition - Renal diet w/fluid restrictions. Alb 2.8 give protein supplements.  Hx meningioma s/p resection 11/01/20  Jen Mow, PA-C [facility] 03/22/2022,11:21 AM  LOS: 1 day

## 2022-03-22 NOTE — Progress Notes (Addendum)
Subjective:   Summary: Ruth Gutierrez is a 56 y.o. year old female currently admitted on the IMTS HD#1 for hypocalcemia s/p total parathyroidectomy.  Overnight Events: Pt developed a fever of 101.6 and was placed on 2L of nasal cannula after dialysis.    Pt was seen today in the AM. She states she has had vomiting this morning since 3 AM. She describes it as non-bloody. She is not experiencing any abdominal pain. She denies any chills, or coughing. However in the room on examination she had multiple non-productive coughs.   Objective:  Vital signs in last 24 hours: Vitals:   03/21/22 1820 03/21/22 1959 03/21/22 2300 03/22/22 0734  BP: (!) 179/101 (!) 148/89 130/81 (!) 153/92  Pulse: (!) 107 (!) 107 93 94  Resp: (!) '21 20 20 16  '$ Temp: (!) 101.1 F (38.4 C) (!) 101.6 F (38.7 C) 98.3 F (36.8 C) 99.2 F (37.3 C)  TempSrc: Oral Oral Oral Oral  SpO2: (!) 88% 100% 93% 94%  Weight:       Supplemental O2:  SpO2: 94 % O2 Flow Rate (L/min): 2 L/min   Physical Exam:  Constitutional: Pt hunched over trash can with recent vomiting, in no acute distress Cardiovascular: RRR, no murmurs, rubs or gallops Pulmonary/Chest: Rhonchi heard bilaterally, normal work of breathing on 2L nasal cannula Abdominal: soft, non-tender, non-distended Skin: warm and dry Extremities: upper/lower extremity pulses 2+, no lower extremity edema present  [facility] Weights   03/21/22 1220 03/21/22 1723  Weight: 66.9 kg 63.5 kg     Intake/Output Summary (Last 24 hours) at 03/22/2022 0901 Last data filed at 03/22/2022 0400 Gross per 24 hour  Intake 495.83 ml  Output 2.6 ml  Net 493.23 ml   Net IO Since Admission: 493.23 mL [03/22/22 0901]  Pertinent Labs:    Latest Ref Rng & Units 03/21/2022   11:27 PM 03/21/2022    2:49 AM 03/20/2022    6:10 PM  CBC  WBC 4.0 - 10.5 K/uL 7.6  8.2    Hemoglobin 12.0 - 15.0 g/dL 9.5  10.7  10.9   Hematocrit 36.0 - 46.0 % 28.8  33.5  32.0    Platelets 150 - 400 K/uL 201  163         Latest Ref Rng & Units 03/22/2022    5:48 AM 03/21/2022   11:27 PM 03/21/2022   12:33 PM  CMP  Glucose 70 - 99 mg/dL   111   BUN 6 - 20 mg/dL   41   Creatinine 0.44 - 1.00 mg/dL   9.95   Sodium 135 - 145 mmol/L   136   Potassium 3.5 - 5.1 mmol/L   4.4   Chloride 98 - 111 mmol/L   96   CO2 22 - 32 mmol/L   24   Calcium 8.9 - 10.3 mg/dL 4.9  5.5  5.3      Imaging: DG CHEST PORT 1 VIEW  Result Date: 03/21/2022 CLINICAL DATA:  Abdominal pain. EXAM: PORTABLE CHEST 1 VIEW COMPARISON:  Chest radiograph dated 10/22/2021. FINDINGS: Left lung base atelectasis versus possible developing infiltrate. A small left pleural effusion may be present. There is mild diffuse interstitial prominence. No pneumothorax. Mild cardiomegaly. Degenerative changes of the spine. No acute osseous pathology. Vascular graft noted in the left axilla. IMPRESSION: 1. Left lung base atelectasis versus possible developing infiltrate. 2. Mild cardiomegaly. Electronically Signed  By: Anner Crete M.D.   On: 03/21/2022 21:40     Assessment/Plan:   Principal Problem:   Hypocalcemia   Patient Summary: Ruth Gutierrez is a 56 y.o. with a pertinent PMH of ESRD on HD MWF, secondary  hyperparathyroidism s/p total parathyroidectomy, failed renal transplant, HTN, COPD, and meningioma who presented with nausea, diarrhea, and epigastric pain and admitted for hypocalcemia s/p total parathyroidectomy on 8/15. Marland Kitchen    #Hypocalcemia S/P Parathyroidectomy Pt underwent parathyroidectomy on 03/18/22 for treatment of secondary hyperparathyroidism due to ESRD. After procedure, pt left AMA after she was left in dialysis until 10 PM and was frustrated. Calcium levels today are 4.9. Pt complains of pain in her bilateral legs and abdomen. She has also been having non-bloody intermittent emesis since 3AM. Due to her dehydration, nurses were not able to get an appropriate access site for blood draw.    Plan:  -Tylenol '1000mg'$  TID  -Saline 500 bolus on time -Appreciate nephology recommendation  - Calcium bath was unavailable yesterday during HD - Continue Calcium Gluconate 2g/100 ml IV  - Continue calcitriol 72mg     #Possible Pneumonia Pt reached a fever of 101.6 last night with new onset cough productive of sputum. Chest X-Ray was done, and revealed a left lung base atelectasis versus possible developing infiltrate, and mild cardiomegaly. This morning, her temperature was slightly reduced at 99.2, and she now is having vomiting episodes, which could be post-tussive emesis. She was started on IV Vanc and IV Ceftriaxone. CBC from last night showed no lymphocytosis, procalcitonin was 1.73, and lactic acid was normal at 1.4.   Plan:  - Continue IV Ceftriaxone  - Await results of blood culture - MRSA swab resulted negative, will d/c Vancomycin  #ESRD Dialysis MWF, appreciate nephrology recommendations for calcium supplementation during dialysis  #Hx of UGIB secondary to Gastric Ulcer Pt has been hospitalized previously for acute blood loss anemia secondary to Upper GI Bleed. EGD performed which showed a gastric ulcer. Pt did not have a repeat EGD following treatment of ulcer. Continuous gastric ulcer may be cause of patients initial presentation symptoms. She states her GI symptoms have improved significantly since admission.   Plan:  -Protonix Carafate continuance  -Outpatient GI Follow Up   Code: Full    Dispo: Anticipated discharge in less than midnights  SDrucie Opitz MD PGY-1 Internal Medicine Resident Pager Number [phone] contact the on call pager after 5 pm and on weekends at [phone]

## 2022-03-23 DIAGNOSIS — E892 Postprocedural hypoparathyroidism: Secondary | ICD-10-CM

## 2022-03-23 LAB — RENAL FUNCTION PANEL
Albumin: 3.1 g/dL — ABNORMAL LOW (ref 3.5–5.0)
Anion gap: 19 — ABNORMAL HIGH (ref 5–15)
BUN: 30 mg/dL — ABNORMAL HIGH (ref 6–20)
CO2: 23 mmol/L (ref 22–32)
Calcium: 6.1 mg/dL — CL (ref 8.9–10.3)
Chloride: 88 mmol/L — ABNORMAL LOW (ref 98–111)
Creatinine, Ser: 8.17 mg/dL — ABNORMAL HIGH (ref 0.44–1.00)
GFR, Estimated: 5 mL/min — ABNORMAL LOW (ref >60)
Glucose, Bld: 95 mg/dL (ref 70–99)
Phosphorus: 3.9 mg/dL (ref 2.5–4.6)
Potassium: 3.5 mmol/L (ref 3.5–5.1)
Sodium: 130 mmol/L — ABNORMAL LOW (ref 135–145)

## 2022-03-23 LAB — HEPATITIS B SURFACE ANTIBODY, QUANTITATIVE: Hep B S AB Quant (Post): 24.1 m[IU]/mL (ref >9.9)

## 2022-03-23 LAB — CALCIUM
Calcium: 5.6 mg/dL — CL (ref 8.9–10.3)
Calcium: 5.8 mg/dL — CL (ref 8.9–10.3)

## 2022-03-23 MED ORDER — CALCIUM CARBONATE ANTACID 500 MG PO CHEW
1800.0000 mg | CHEWABLE_TABLET | Freq: Four times a day (QID) | ORAL | Status: DC
  Administered 2022-03-23 – 2022-03-24 (×4): 1800 mg via ORAL
  Filled 2022-03-23 (×4): qty 9

## 2022-03-23 MED ORDER — CALCIUM CARBONATE ANTACID 500 MG PO CHEW
1600.0000 mg | CHEWABLE_TABLET | Freq: Four times a day (QID) | ORAL | Status: DC
  Administered 2022-03-23: 1600 mg via ORAL

## 2022-03-23 MED ORDER — PANTOPRAZOLE SODIUM 40 MG PO TBEC
40.0000 mg | DELAYED_RELEASE_TABLET | Freq: Two times a day (BID) | ORAL | Status: DC
  Administered 2022-03-23 – 2022-03-28 (×11): 40 mg via ORAL
  Filled 2022-03-23 (×11): qty 1

## 2022-03-23 MED ORDER — SODIUM CHLORIDE 0.9 % IV SOLN
250.0000 mg | Freq: Every day | INTRAVENOUS | Status: AC
  Administered 2022-03-23 – 2022-03-24 (×2): 250 mg via INTRAVENOUS
  Filled 2022-03-23 (×2): qty 20

## 2022-03-23 MED ORDER — CALCIUM GLUCONATE-NACL 1-0.675 GM/50ML-% IV SOLN
1.0000 g | Freq: Four times a day (QID) | INTRAVENOUS | Status: DC
  Filled 2022-03-23 (×2): qty 50

## 2022-03-23 MED ORDER — CHLORHEXIDINE GLUCONATE CLOTH 2 % EX PADS
6.0000 | MEDICATED_PAD | Freq: Every day | CUTANEOUS | Status: DC
  Administered 2022-03-23 – 2022-03-28 (×5): 6 via TOPICAL

## 2022-03-23 MED ORDER — DOXYCYCLINE HYCLATE 100 MG PO TABS
100.0000 mg | ORAL_TABLET | Freq: Two times a day (BID) | ORAL | Status: DC
  Filled 2022-03-23 (×3): qty 1

## 2022-03-23 MED ORDER — SODIUM CHLORIDE 0.9 % IV SOLN
3.0000 g | Freq: Four times a day (QID) | INTRAVENOUS | Status: DC
  Administered 2022-03-23 – 2022-03-25 (×7): 3 g via INTRAVENOUS
  Filled 2022-03-23 (×10): qty 30

## 2022-03-23 MED ORDER — DARBEPOETIN ALFA 150 MCG/0.3ML IJ SOSY: 150.0000 ug | PREFILLED_SYRINGE | INTRAMUSCULAR | Status: DC

## 2022-03-23 NOTE — Progress Notes (Signed)
Subjective:   Summary: Ruth Gutierrez is a 56 y.o. year old female currently admitted on the IMTS HD#2 for hypocalcemia s/p total parathyroidectomy done on 8/15.  Overnight Events: NOE    Pt was seen today in the AM. She looks generally much better than yesterday. She denied her IV ceftraixone today. She states she should be able to tolerate PO now. She denies any vomiting or nausea, but says her cramps come and go.   Objective:  Vital signs in last 24 hours: Vitals:   03/22/22 1623 03/22/22 2000 03/23/22 0905 03/23/22 0949  BP: 136/86 139/79 (!) 147/86 136/81  Pulse: 82 77 80 82  Resp: '16 18  17  '$ Temp: 98.2 F (36.8 C) 97.9 F (36.6 C) 98.1 F (36.7 C)   TempSrc: Oral Oral  Oral  SpO2: 99% 100%  100%  Weight:       Supplemental O2: Room Air 97%   Physical Exam:  Constitutional: anxious looking woman, in no acute distress Cardiovascular: RRR, no murmurs, rubs or gallops Pulmonary/Chest: Rhonchi heard bilaterally, normal work of breathing on  Abdominal: soft, non-tender, non-distended Skin: warm and dry Extremities: upper/lower extremity pulses 2+, no lower extremity edema present  [facility] Weights   03/21/22 1220 03/21/22 1723  Weight: 66.9 kg 63.5 kg     Intake/Output Summary (Last 24 hours) at 03/23/2022 1041 Last data filed at 03/22/2022 1702 Gross per 24 hour  Intake 553.13 ml  Output --  Net 553.13 ml   Net IO Since Admission: 1,046.36 mL [03/23/22 1041]  Pertinent Labs:    Latest Ref Rng & Units 03/21/2022   11:27 PM 03/21/2022    2:49 AM 03/20/2022    6:10 PM  CBC  WBC 4.0 - 10.5 K/uL 7.6  8.2    Hemoglobin 12.0 - 15.0 g/dL 9.5  10.7  10.9   Hematocrit 36.0 - 46.0 % 28.8  33.5  32.0   Platelets 150 - 400 K/uL 201  163         Latest Ref Rng & Units 03/23/2022    6:52 AM 03/22/2022    8:47 PM 03/22/2022   12:16 PM  CMP  Glucose 70 - 99 mg/dL 95     BUN 6 - 20 mg/dL 30     Creatinine 0.44 - 1.00 mg/dL 8.17     Sodium 135 -  145 mmol/L 130     Potassium 3.5 - 5.1 mmol/L 3.5     Chloride 98 - 111 mmol/L 88     CO2 22 - 32 mmol/L 23     Calcium 8.9 - 10.3 mg/dL 6.1  5.8  4.9     Assessment/Plan:   Principal Problem:   Hypocalcemia   Patient Summary: Ruth Gutierrez is a 56 y.o. with a pertinent PMH of ESRD on HD MWF, secondary hyperparathyroidism s/p total parathyroidectomy, failed renal transplant, HTN, COPD, and meningioma who presented with nausea, diarrhea, and epigastric pain and admitted for hypocalcemia s/p total parathyroidectomy on 8/15.    #Hypocalcemia S/P Parathyroidectomy on 8/15 #ESRD Pt underwent parathyroidectomy on 03/18/22 for treatment of secondary hyperparathyroidism due to ESRD. After procedure, pt left AMA after she was left in dialysis until 10 PM and was frustrated.   Calcium levels have improved today at 6.1, compared to 4.9 yesterday. She is stating that she is feeling better in general, and her muscle cramps have become intermittent with light  intensity. She denies any nausea or vomiting.   Plan:  -Appreciate nephrology recommendations - Due to better calcium value, decrease Ca gluconate from 3g to 1g IV Q6hrs. Will continue IV calcium until >7.  - Increased CaCO3 to 1.6 QID - Calcitriol 104mg BID -Continue Calcium checks q4H until  >6.5 on PO meds - Will be stable to D/C when Ca is 7.5-8.5 on PO meds alone - Continue dialysis MWF  #Pneumonia Chest X-Ray revealed a left lung base atelectasis vs possible developing infiltrate. CBC remains negative. Pt is afebrile today at 97.9. Pt has declined Ceftriaxone IV because she states it makes her feel sick.  Plan:  -Await blood cultures  -D/C Ceftriaxone  - Doxy '100mg'$  BID as patient states she is able to tolerate PO   Diet: Normal IVF: None,None Code: Full  Dispo: Anticipated discharge in less than two midnights.  SDrucie Opitz MD PGY-1 Internal Medicine Resident Pager Number [phone] contact the on call pager  after 5 pm and on weekends at [phone]

## 2022-03-23 NOTE — Progress Notes (Signed)
[facility] Progress Note   Subjective:   Patient seen and examined at bedside.  Family sleep at bedside, did not awaken.  Patient sitting on side of bed, tapping her foot.  Reports she did not sleep well due to be woken for various things.  Vomiting as resolved.  Denies numbness/tingling, muscle twitching, nausea, diarrhea, SOB, and chest pain.   Objective Vitals:   03/22/22 1623 03/22/22 2000 03/23/22 0905 03/23/22 0949  BP: 136/86 139/79 (!) 147/86 136/81  Pulse: 82 77 80 82  Resp: '16 18  17  '$ Temp: 98.2 F (36.8 C) 97.9 F (36.6 C) 98.1 F (36.7 C)   TempSrc: Oral Oral  Oral  SpO2: 99% 100%  100%  Weight:       Physical Exam General:chronically ill appearing female in NAD Heart:RRR, no mrg Lungs:+rhonchi b/l R>L,  nml WOB on RA Abdomen:soft, NTND Extremities:trace LE edema Dialysis Access: LU AVF +b/t   Filed Weights   03/21/22 1220 03/21/22 1723  Weight: 66.9 kg 63.5 kg    Intake/Output Summary (Last 24 hours) at 03/23/2022 1017 Last data filed at 03/22/2022 1702 Gross per 24 hour  Intake 553.13 ml  Output --  Net 553.13 ml    Additional Objective Labs: Basic Metabolic Panel: Recent Labs  Lab 03/20/22 0716 03/20/22 1753 03/21/22 0249 03/21/22 1233 03/21/22 2327 03/22/22 1216 03/22/22 2047 03/23/22 0652  NA 136   < > 135 136  --   --   --  130*  K 3.8   < > 4.3 4.4  --   --   --  3.5  CL 96*   < > 94* 96*  --   --   --  88*  CO2 25   < > 22 24  --   --   --  23  GLUCOSE 83   < > 94 111*  --   --   --  95  BUN 38*   < > 44* 41*  --   --   --  30*  CREATININE 8.78*   < > 10.39* 9.95*  --   --   --  8.17*  CALCIUM 5.3*   < > 5.4* 5.3*   < > 4.9* 5.8* 6.1*  PHOS 4.1  --   --  3.9  --   --   --  3.9   < > = values in this interval not displayed.   Liver Function Tests: Recent Labs  Lab 03/20/22 1753 03/21/22 0249 03/21/22 1233 03/23/22 0652  AST 22 23  --   --   ALT 17 19  --   --   ALKPHOS 235* 278*  --   --   BILITOT 0.6 1.1  --    --   PROT 7.3 7.5  --   --   ALBUMIN 3.2* 3.2* 2.8* 3.1*   Recent Labs  Lab 03/20/22 1753  LIPASE 34   CBC: Recent Labs  Lab 03/19/22 0132 03/20/22 1753 03/20/22 1810 03/21/22 0249 03/21/22 2327  WBC 7.5 7.3  --  8.2 7.6  HGB 10.2* 10.1* 10.9* 10.7* 9.5*  HCT 33.1* 32.3* 32.0* 33.5* 28.8*  MCV 93.8 91.5  --  91.3 88.6  PLT 156 158  --  163 201   Blood Culture    Component Value Date/Time   SDES BLOOD RIGHT HAND 03/21/2022 2340   SPECREQUEST  03/21/2022 2340    BOTTLES DRAWN AEROBIC ONLY Blood Culture results may not be optimal due to  an inadequate volume of blood received in culture bottles   CULT  03/21/2022 2340    NO GROWTH 2 DAYS Performed at [facility], [place] [address] [place], [place] [address]    REPTSTATUS PENDING 03/21/2022 2340   Iron Studies:  Recent Labs    03/21/22 2327  IRON 22*  TIBC 146*  FERRITIN 1,105*   Lab Results  Component Value Date   INR 1.4 (H) 10/31/2020   INR 1.35 03/09/2011   Studies/Results: DG CHEST PORT 1 VIEW  Result Date: 03/21/2022 CLINICAL DATA:  Abdominal pain. EXAM: PORTABLE CHEST 1 VIEW COMPARISON:  Chest radiograph dated 10/22/2021. FINDINGS: Left lung base atelectasis versus possible developing infiltrate. A small left pleural effusion may be present. There is mild diffuse interstitial prominence. No pneumothorax. Mild cardiomegaly. Degenerative changes of the spine. No acute osseous pathology. Vascular graft noted in the left axilla. IMPRESSION: 1. Left lung base atelectasis versus possible developing infiltrate. 2. Mild cardiomegaly. Electronically Signed   By: Anner Crete M.D.   On: 03/21/2022 21:40    Medications:  calcium gluconate 3 g (03/23/22 0417)   cefTRIAXone (ROCEPHIN)  IV 2 g (03/21/22 2343)    acetaminophen  1,000 mg Oral Q8H   amLODipine  10 mg Oral Daily   calcitRIOL  1 mcg Oral BID BM   calcium carbonate  1,400 mg of elemental calcium Oral QID   carvedilol  25 mg Oral BID WC    Chlorhexidine Gluconate Cloth  6 each Topical Q0600   feeding supplement (NEPRO CARB STEADY)  237 mL Oral TID AC   heparin  5,000 Units Subcutaneous Q8H   nicotine  14 mg Transdermal Daily   pantoprazole (PROTONIX) IV  40 mg Intravenous Q12H   sertraline  50 mg Oral Daily   sucralfate  1 g Oral TID WC & HS    Dialysis Orders: [facility]  4h 400/AF 1.5  67 kg   2K/3Ca bath  AVF  LUA   Hep none  - hectorol 2 ug IV tiw  - mircera 225 q2wk last given 8/2   Assessment/Plan:  Hypocalcemia -  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  Using 2.5 Ca bath w/HD as it is only available option.  Can increase Ca in bath on d/c.  Given 2g IV calcium gluconate in ED and started CaCO3 1g QID and calcitriol 0.31mg TID.  Ca 5.3, iCa 0.53 on admit, improved to 1.06, then dropped again to 4.9. Slowly improving with IV and PO supplements.  Ca 6.1 this AM. Will decrease Ca gluconate from 3g to 1g IV q6hrs.  Continue IV Ca until Ca>7.  Increase CaCO3 to 1.6 g QID.  Continue Calcitriol to 116m BID.  Check Ca q4hrs until >6.5 on PO meds. Will be stable to d/c home with Ca 7.5-8.5 on PO meds alone. Fever - Afebrile x 24hrs. BC w/NGTD.  WBC WNL.  CXR with L lung atelectasis vs developing infiltrate, ?PNA.  ABX started.  ESRD -  on HD MWF.  HD tomorrow per regular schedule.    Hypertension/volume  - BP variable. Trace edema on exam. Under EDW if weights correct. Continue UF as tolerated, will need lower dry on d/c.  Anemia of CKD - Last Hgb 9.5.  Will order ESA with HD on Monday.    Secondary Hyperparathyroidism - hypocalcemia as noted above with recent PTX.  Supplements as above.  Phos in goal.   Nutrition - Renal  diet w/fluid restrictions. Alb 3.1 give protein supplements.  Hx meningioma s/p resection 11/01/20  Jen Mow, PA-C [place] 03/23/2022,10:17 AM  LOS: 2 days

## 2022-03-23 NOTE — Progress Notes (Signed)
Date and time results received: 03/23/22 8:17 AM  Critical Value: Calcium: 6.1   Name of Provider Notified: [place]  Calcium is improving, no new orders

## 2022-03-23 NOTE — Progress Notes (Signed)
Critical Lab  Calcium 5.6, MD notified

## 2022-03-24 DIAGNOSIS — R9389 Abnormal findings on diagnostic imaging of other specified body structures: Secondary | ICD-10-CM

## 2022-03-24 LAB — RENAL FUNCTION PANEL
Albumin: 2.5 g/dL — ABNORMAL LOW (ref 3.5–5.0)
Anion gap: 16 — ABNORMAL HIGH (ref 5–15)
BUN: 36 mg/dL — ABNORMAL HIGH (ref 6–20)
CO2: 26 mmol/L (ref 22–32)
Calcium: 6.2 mg/dL — CL (ref 8.9–10.3)
Chloride: 91 mmol/L — ABNORMAL LOW (ref 98–111)
Creatinine, Ser: 9.52 mg/dL — ABNORMAL HIGH (ref 0.44–1.00)
GFR, Estimated: 4 mL/min — ABNORMAL LOW (ref >60)
Glucose, Bld: 94 mg/dL (ref 70–99)
Phosphorus: 3.6 mg/dL (ref 2.5–4.6)
Potassium: 3.7 mmol/L (ref 3.5–5.1)
Sodium: 133 mmol/L — ABNORMAL LOW (ref 135–145)

## 2022-03-24 LAB — CALCIUM
Calcium: 6.8 mg/dL — ABNORMAL LOW (ref 8.9–10.3)
Calcium: 6.5 mg/dL — ABNORMAL LOW (ref 8.9–10.3)
Calcium: 7.2 mg/dL — ABNORMAL LOW (ref 8.9–10.3)

## 2022-03-24 NOTE — Progress Notes (Signed)
[facility] Progress Note   Subjective:   Calcium 6.2 (corrected calcium 7.4). Denies numbness and paraesthesias. Reports she had some SOB last night but none at present. No CP, dizziness, nausea or vomiting.   Objective Vitals:   03/23/22 0949 03/23/22 1741 03/23/22 2130 03/24/22 0630  BP: 136/81 (!) 142/83 (!) 147/77 (!) 174/96  Pulse: 82 78 89 93  Resp: '17 17 18 20  '$ Temp:  98.9 F (37.2 C) 98.7 F (37.1 C) 98.4 F (36.9 C)  TempSrc: Oral Oral Oral Oral  SpO2: 100% 95% 94% 96%  Weight:       Physical Exam General: Alert female in NAD Heart: RRR, no murmurs, rubs or gallops Lungs: + rhonchi bilateral lower lobes. Normal WOB on RA Abdomen: soft, non-distended, +BS Extremities: trace pitting edema b/l lower extremities Dialysis Access: LUE AVF + bruit  Additional Objective Labs: Basic Metabolic Panel: Recent Labs  Lab 03/21/22 1233 03/21/22 2327 03/23/22 0652 03/23/22 1319 03/23/22 1906 03/24/22 0139  NA 136  --  130*  --   --  133*  K 4.4  --  3.5  --   --  3.7  CL 96*  --  88*  --   --  91*  CO2 24  --  23  --   --  26  GLUCOSE 111*  --  95  --   --  94  BUN 41*  --  30*  --   --  36*  CREATININE 9.95*  --  8.17*  --   --  9.52*  CALCIUM 5.3*   < > 6.1* 5.6* 5.8* 6.2*  PHOS 3.9  --  3.9  --   --  3.6   < > = values in this interval not displayed.   Liver Function Tests: Recent Labs  Lab 03/20/22 1753 03/21/22 0249 03/21/22 1233 03/23/22 0652 03/24/22 0139  AST 22 23  --   --   --   ALT 17 19  --   --   --   ALKPHOS 235* 278*  --   --   --   BILITOT 0.6 1.1  --   --   --   PROT 7.3 7.5  --   --   --   ALBUMIN 3.2* 3.2* 2.8* 3.1* 2.5*   Recent Labs  Lab 03/20/22 1753  LIPASE 34   CBC: Recent Labs  Lab 03/19/22 0132 03/20/22 1753 03/20/22 1810 03/21/22 0249 03/21/22 2327  WBC 7.5 7.3  --  8.2 7.6  HGB 10.2* 10.1* 10.9* 10.7* 9.5*  HCT 33.1* 32.3* 32.0* 33.5* 28.8*  MCV 93.8 91.5  --  91.3 88.6  PLT 156 158  --  163 201    Blood Culture    Component Value Date/Time   SDES BLOOD RIGHT HAND 03/21/2022 2340   SPECREQUEST  03/21/2022 2340    BOTTLES DRAWN AEROBIC ONLY Blood Culture results may not be optimal due to an inadequate volume of blood received in culture bottles   CULT  03/21/2022 2340    NO GROWTH 3 DAYS Performed at [facility], [place] [address], [place], [place] [address]    REPTSTATUS PENDING 03/21/2022 2340    Cardiac Enzymes: No results for input(s): "CKTOTAL", "CKMB", "CKMBINDEX", "TROPONINI" in the last 168 hours. CBG: No results for input(s): "GLUCAP" in the last 168 hours. Iron Studies:  Recent Labs    03/21/22 2327  IRON 22*  TIBC 146*  FERRITIN 1,105*   '@lablastinr3'$ @ Studies/Results: No  results found. Medications:  calcium gluconate 3 g (03/24/22 0626)   ferric gluconate (FERRLECIT) IVPB 250 mg (03/23/22 1514)    acetaminophen  1,000 mg Oral Q8H   amLODipine  10 mg Oral Daily   calcitRIOL  1 mcg Oral BID BM   calcium carbonate  1,800 mg of elemental calcium Oral QID   carvedilol  25 mg Oral BID WC   Chlorhexidine Gluconate Cloth  6 each Topical Q0600   darbepoetin (ARANESP) injection - DIALYSIS  150 mcg Intravenous Q Mon-HD   doxycycline  100 mg Oral Q12H   feeding supplement (NEPRO CARB STEADY)  237 mL Oral TID AC   heparin  5,000 Units Subcutaneous Q8H   nicotine  14 mg Transdermal Daily   pantoprazole  40 mg Oral BID   sertraline  50 mg Oral Daily   sucralfate  1 g Oral TID WC & HS    Dialysis Orders: [facility]  4h 400/AF 1.5  67 kg   2K/3Ca bath  AVF  LUA   Hep none  - hectorol 2 ug IV tiw  - mircera 225 q2wk last given 8/2  Assessment/Plan:  Hypocalcemia -  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  Using 2.5 Ca bath w/HD as it is only available option.  Can increase Ca in bath on d/c.   Continue calcitriol 1 mcg BID and calcium carbonate '1800mg'$  QID (dose increased 03/23/22) Continue IV  calcium gluconate 3g q 6 hours Check Ca q4hrs until >6.5 on PO meds. Will be stable to d/c home with Ca 7.5-8.5 on PO meds alone. Fever - Afebrile x 24hrs. BC w/NGTD.  WBC WNL.  CXR with L lung atelectasis vs developing infiltrate, ?PNA.  ABX started.  ESRD -  on HD MWF.  HD tomorrow per regular schedule.    Hypertension/volume  - BP variable. Trace edema on exam and rhonchi. Under EDW if weights correct. Continue UF as tolerated, will need lower dry on d/c.  Anemia of CKD - Last Hgb 9.5.  ESA ordered with HD today.     Secondary Hyperparathyroidism - hypocalcemia as noted above with recent PTX.  Supplements as above.  Phos in goal.   Nutrition - Renal diet w/fluid restrictions. Alb 3.1 give protein supplements.  Hx meningioma s/p resection 11/01/20  Anice Paganini, PA-C 03/24/2022, 9:41 AM  [facility] Pager: [phone]

## 2022-03-24 NOTE — Progress Notes (Signed)
Subjective:   Summary: Ruth Gutierrez is a 56 y.o. year old female currently admitted on the IMTS HD#3 for symptomatic hypocalcemia s/p total parathyroidectomy done on 8/15.  Overnight Events:    Pt was seen today in the AM. She is sitting up and has been tolerating her diet well. She denies any nausea and vomiting.   Objective:  Vital signs in last 24 hours: Vitals:   03/23/22 1741 03/23/22 2130 03/24/22 0630 03/24/22 1002  BP: (!) 142/83 (!) 147/77 (!) 174/96 (!) 169/90  Pulse: 78 89 93 87  Resp: '17 18 20 17  '$ Temp: 98.9 F (37.2 C) 98.7 F (37.1 C) 98.4 F (36.9 C) 98 F (36.7 C)  TempSrc: Oral Oral Oral Oral  SpO2: 95% 94% 96% 98%  Weight:       Supplemental O2: Room Air 98% O2   Physical Exam:  Constitutional: well-appearing female, sitting in bed, in no acute distress Cardiovascular: RRR, no murmurs, rubs or gallops Pulmonary/Chest: normal work of breathing on room air, rhonchi heard on L side Abdominal: soft, non-tender, non-distended Skin: warm and dry Extremities: upper/lower extremity pulses 2+, no lower extremity edema present  Filed Weights   03/21/22 1220 03/21/22 1723  Weight: 66.9 kg 63.5 kg     Intake/Output Summary (Last 24 hours) at 03/24/2022 1151 Last data filed at 03/24/2022 0900 Gross per 24 hour  Intake 1211.99 ml  Output --  Net 1211.99 ml   Net IO Since Admission: 2,258.35 mL [03/24/22 1151]  Pertinent Labs:    Latest Ref Rng & Units 03/21/2022   11:27 PM 03/21/2022    2:49 AM 03/20/2022    6:10 PM  CBC  WBC 4.0 - 10.5 K/uL 7.6  8.2    Hemoglobin 12.0 - 15.0 g/dL 9.5  10.7  10.9   Hematocrit 36.0 - 46.0 % 28.8  33.5  32.0   Platelets 150 - 400 K/uL 201  163         Latest Ref Rng & Units 03/24/2022    1:39 AM 03/23/2022    7:06 PM 03/23/2022    1:19 PM  CMP  Glucose 70 - 99 mg/dL 94     BUN 6 - 20 mg/dL 36     Creatinine 0.44 - 1.00 mg/dL 9.52     Sodium 135 - 145 mmol/L 133     Potassium 3.5 - 5.1  mmol/L 3.7     Chloride 98 - 111 mmol/L 91     CO2 22 - 32 mmol/L 26     Calcium 8.9 - 10.3 mg/dL 6.2  5.8  5.6    Assessment/Plan:   Principal Problem:   Hypocalcemia Active Problems:   Pneumonia involving left lung   ESRD on hemodialysis ([place])   Secondary renal hyperparathyroidism ([place])   H/O parathyroidectomy ([place])   Patient Summary: Ruth Gutierrez is a 56 y.o. with a pertinent PMH of ESRD on HD MWF, secondary hyperparathyroidism s/p total parathyroidectomy, failed renal transplant, HTN, COPD, and meningioma who presented with nausea, diarrhea, and admitted for symptomatic hypocalcemia s/p total parathyroidectomy on 8/15.    #Hypocaclemia S/P Parathyroidectomy on 03/18/22 #ESRD  Calcium level has gone up to 6.2, vs 6.1 yesterday. She is set to have dialysis today where nephrology intends to use a calcium bath. Pt denies nausea and vomiting and is doing better overall. She denies any pain.   Plan: - Appreciate nephrology  recommendations - Continue calcitriol 61mg BID and calcium carbonate '1800mg'$  QID - Continue IV calcium gluconate 3g q6H - Check Ca q4H until level is >6.5 on PO meds. Should be stable to D/C when Ca is 7.5-8.5 - Using 2.5 Ca bath w/ HD today    #Bilateral Infiltrates on Chest X-Ray  Chest X-Ray revealed a left lung base atelectasis vs possible developing infiltrate. CBC remains negative. Pt is afebrile today at 98. D/C'd ceftriaxone yesterday as patient felt like she was getting sick because of it. Started Doxycycline '100mg'$  BID, however patient denied. She states she is feeling fine today.   Plan:  - Pt consistently denying antibiotics and is asymptomatic. Will observe.     Code: Full   Dispo: Anticipated discharge to in less than 2 midnights  SDrucie Opitz MD PGY-1 Internal Medicine Resident Pager Number [phone] contact the on call pager after 5 pm and on weekends at [phone]

## 2022-03-24 NOTE — Consult Note (Signed)
   [facility] Inpatient Consult   03/24/2022  [place] [birth year] [id number]   [facility] [ACO] Patient:    Primary Care Provider: Gaylan Gerold, DO with [facility] Internal Medicine, is an [place] provider with a Care Management team and program and is listed for the [facility] follow up needs     Patient screened for high risk score for unplanned readmission risk.  Reviewed to assess for potential [place] Management service needs for post hospital transition for readmission prevention needs.  Review of patient's medical record reveals patient is less than 7 days readmission noted. After leaving AMA per MD notes.  Patient with noted ongoing follow up for vascular and has HD needs.   Patient on contact precaution, will continue to reach out by phone.   Plan:  Continue to follow progress and disposition to assess for post hospital care management needs.    For questions contact:    Natividad Brood, RN BSN [facility] Liaison  [phone] business mobile phone Toll free office [phone]  Fax number: [phone] [email or web address]

## 2022-03-24 NOTE — Progress Notes (Signed)
Mobility Specialist Progress Note   03/24/22 1213  Mobility  Activity Ambulated with assistance in hallway  Level of Assistance Modified independent, requires aide device or extra time  Assistive Device Front wheel walker  Distance Ambulated (ft) 550 ft  Activity Response Tolerated well  $Mobility charge 1 Mobility   Patient received at EOB agreeable to participate in mobility. Ambulated in hallway independently but gait slightly unsteady d/t medicine pt claimed to have just taken. Returned to room without complaint or incident. Was left back at EOB with all needs met, call bell in reach.    Holland Falling Mobility Specialist MS [facility] #:  [phone] Acute Rehab Office:  [phone]

## 2022-03-24 NOTE — Progress Notes (Signed)
Date and time results received: 03/24/22 0353 (use smartphrase ".now" to insert current time)  Test: calcium  Critical Value: 6.2  Name of Provider Notified: Rick Duff MD   Orders Received? Or Actions Taken?:  MD aware

## 2022-03-24 NOTE — Care Management Important Message (Signed)
Important Message  Patient Details  Name: Ruth Gutierrez MRN: [id number] Date of Birth: 08-19-1965   Medicare Important Message Given:  Yes     Hannah Beat 03/24/2022, 12:30 PM

## 2022-03-24 NOTE — Progress Notes (Signed)
Date and time results received: 03/23/22 2130   Test: Calcium  Critical Value: 5.8  Name of Provider Notified: Rick Duff MD  Orders Received? Or Actions Taken?:  MD aware. Improved from last result.

## 2022-03-24 NOTE — Plan of Care (Signed)
Pt is alert and oriented x 4. Impaired judgement with care. Pt refused Heparin for shift. Dr. Rick Duff aware. Pt refused doxy as well. Pt attempted to refuse Protonix but after educating on gastric ulcer pt agreed. Calcium improving but remains critical MD Eulas Post aware. See critical results notes. Vitals stable. Pt received one dose of Dilaudid this shift. Ambulates in room. Hemo pt and due today. BP slightly elevated.  Problem: Health Behavior/Discharge Planning: Goal: Ability to manage health-related needs will improve Outcome: Not Progressing   Problem: Clinical Measurements: Goal: Diagnostic test results will improve Outcome: Not Progressing   Problem: Education: Goal: Knowledge of General Education information will improve Description: Including pain rating scale, medication(s)/side effects and non-pharmacologic comfort measures Outcome: Progressing   Problem: Clinical Measurements: Goal: Ability to maintain clinical measurements within normal limits will improve Outcome: Progressing Goal: Will remain free from infection Outcome: Progressing Goal: Respiratory complications will improve Outcome: Progressing Goal: Cardiovascular complication will be avoided Outcome: Progressing   Problem: Activity: Goal: Risk for activity intolerance will decrease Outcome: Progressing   Problem: Nutrition: Goal: Adequate nutrition will be maintained Outcome: Progressing   Problem: Coping: Goal: Level of anxiety will decrease Outcome: Progressing   Problem: Elimination: Goal: Will not experience complications related to bowel motility Outcome: Progressing Goal: Will not experience complications related to urinary retention Outcome: Progressing   Problem: Pain Managment: Goal: General experience of comfort will improve Outcome: Progressing   Problem: Safety: Goal: Ability to remain free from injury will improve Outcome: Progressing   Problem: Skin Integrity: Goal: Risk for  impaired skin integrity will decrease Outcome: Progressing

## 2022-03-25 ENCOUNTER — Encounter (INDEPENDENT_AMBULATORY_CARE_PROVIDER_SITE_OTHER): Payer: Medicare Other

## 2022-03-25 ENCOUNTER — Ambulatory Visit (INDEPENDENT_AMBULATORY_CARE_PROVIDER_SITE_OTHER): Payer: Medicare Other | Admitting: Vascular Surgery

## 2022-03-25 LAB — RENAL FUNCTION PANEL
Albumin: 2.4 g/dL — ABNORMAL LOW (ref 3.5–5.0)
Anion gap: 20 — ABNORMAL HIGH (ref 5–15)
BUN: 49 mg/dL — ABNORMAL HIGH (ref 6–20)
CO2: 21 mmol/L — ABNORMAL LOW (ref 22–32)
Calcium: 7.6 mg/dL — ABNORMAL LOW (ref 8.9–10.3)
Chloride: 93 mmol/L — ABNORMAL LOW (ref 98–111)
Creatinine, Ser: 10.99 mg/dL — ABNORMAL HIGH (ref 0.44–1.00)
GFR, Estimated: 4 mL/min — ABNORMAL LOW (ref >60)
Glucose, Bld: 62 mg/dL — ABNORMAL LOW (ref 70–99)
Phosphorus: 3.9 mg/dL (ref 2.5–4.6)
Potassium: 4.3 mmol/L (ref 3.5–5.1)
Sodium: 134 mmol/L — ABNORMAL LOW (ref 135–145)

## 2022-03-25 LAB — CALCIUM
Calcium: 7.7 mg/dL — ABNORMAL LOW (ref 8.9–10.3)
Calcium: 7.4 mg/dL — ABNORMAL LOW (ref 8.9–10.3)
Calcium: 7.5 mg/dL — ABNORMAL LOW (ref 8.9–10.3)
Calcium: 6.7 mg/dL — ABNORMAL LOW (ref 8.9–10.3)

## 2022-03-25 MED ORDER — PENTAFLUOROPROP-TETRAFLUOROETH EX AERO: 1.0000 | INHALATION_SPRAY | CUTANEOUS | Status: DC | PRN

## 2022-03-25 MED ORDER — LIDOCAINE HCL (PF) 1 % IJ SOLN: 5.0000 mL | INTRAMUSCULAR | Status: DC | PRN

## 2022-03-25 MED ORDER — CALCIUM CARBONATE ANTACID 500 MG PO CHEW
2000.0000 mg | CHEWABLE_TABLET | Freq: Four times a day (QID) | ORAL | Status: DC
  Administered 2022-03-25 (×2): 2000 mg via ORAL
  Filled 2022-03-25 (×3): qty 10

## 2022-03-25 MED ORDER — LIDOCAINE-PRILOCAINE 2.5-2.5 % EX CREA: 1.0000 | TOPICAL_CREAM | CUTANEOUS | Status: DC | PRN

## 2022-03-25 NOTE — Progress Notes (Signed)
Patient arrived back to [place] room 26 from HD alert and oriented x4, pain level 0/10, bed in lowest position, call light in reach, lunch tray ordered. Will continue to monitor pt.

## 2022-03-25 NOTE — Plan of Care (Signed)

## 2022-03-25 NOTE — Telephone Encounter (Signed)
Unable to reach Pt: Pt mailbox is full

## 2022-03-25 NOTE — Progress Notes (Signed)
[facility] Progress Note   Subjective:   Calcium improved to 7.7 (corrected calcium 9.0) today. Pt seen on HD. Denies SOB, CP, dizziness, abdominal pain and nausea. No numbness/tingling  Objective Vitals:   03/25/22 0741 03/25/22 0744 03/25/22 0800 03/25/22 0830  BP: (!) 185/90 (!) 187/90 (!) 162/103 (!) 155/81  Pulse: 89 87 88 81  Resp: (!) '21 13 16 '$ (!) 0  Temp: 98.4 F (36.9 C)     TempSrc: Oral     SpO2: 96% 93% 94% 96%  Weight: 67 kg      Physical Exam General: Alert female in NAD Heart: RRR, no murmurs, rubs or gallops Lungs: + rhonchi bilateral lower lobes. No wheezing, respirations unlabored on RA Abdomen: Soft, non-distended, +BS Extremities: Trace edema b/l lower extremities Dialysis Access: LUE AVF accessed  Additional Objective Labs: Basic Metabolic Panel: Recent Labs  Lab 03/23/22 0652 03/23/22 1319 03/24/22 0139 03/24/22 1125 03/24/22 1924 03/25/22 0137 03/25/22 0546  NA 130*  --  133*  --   --  134*  --   K 3.5  --  3.7  --   --  4.3  --   CL 88*  --  91*  --   --  93*  --   CO2 23  --  26  --   --  21*  --   GLUCOSE 95  --  94  --   --  62*  --   BUN 30*  --  36*  --   --  49*  --   CREATININE 8.17*  --  9.52*  --   --  10.99*  --   CALCIUM 6.1*   < > 6.2*   < > 7.2* 7.6* 7.7*  PHOS 3.9  --  3.6  --   --  3.9  --    < > = values in this interval not displayed.   Liver Function Tests: Recent Labs  Lab 03/20/22 1753 03/21/22 0249 03/21/22 1233 03/23/22 0652 03/24/22 0139 03/25/22 0137  AST 22 23  --   --   --   --   ALT 17 19  --   --   --   --   ALKPHOS 235* 278*  --   --   --   --   BILITOT 0.6 1.1  --   --   --   --   PROT 7.3 7.5  --   --   --   --   ALBUMIN 3.2* 3.2*   < > 3.1* 2.5* 2.4*   < > = values in this interval not displayed.   Recent Labs  Lab 03/20/22 1753  LIPASE 34   CBC: Recent Labs  Lab 03/19/22 0132 03/20/22 1753 03/20/22 1810 03/21/22 0249 03/21/22 2327  WBC 7.5 7.3  --  8.2 7.6  HGB  10.2* 10.1* 10.9* 10.7* 9.5*  HCT 33.1* 32.3* 32.0* 33.5* 28.8*  MCV 93.8 91.5  --  91.3 88.6  PLT 156 158  --  163 201   Blood Culture    Component Value Date/Time   SDES BLOOD RIGHT HAND 03/21/2022 2340   SPECREQUEST  03/21/2022 2340    BOTTLES DRAWN AEROBIC ONLY Blood Culture results may not be optimal due to an inadequate volume of blood received in culture bottles   CULT  03/21/2022 2340    NO GROWTH 4 DAYS Performed at [facility], [place] [address] [place], [place] [address]    REPTSTATUS PENDING  03/21/2022 2340    Cardiac Enzymes: No results for input(s): "CKTOTAL", "CKMB", "CKMBINDEX", "TROPONINI" in the last 168 hours. CBG: No results for input(s): "GLUCAP" in the last 168 hours. Iron Studies: No results for input(s): "IRON", "TIBC", "TRANSFERRIN", "FERRITIN" in the last 72 hours. '@lablastinr3'$ @ Studies/Results: No results found. Medications:  calcium gluconate 3 g (03/25/22 0411)    acetaminophen  1,000 mg Oral Q8H   amLODipine  10 mg Oral Daily   calcitRIOL  1 mcg Oral BID BM   calcium carbonate  1,800 mg of elemental calcium Oral QID   carvedilol  25 mg Oral BID WC   Chlorhexidine Gluconate Cloth  6 each Topical Q0600   darbepoetin (ARANESP) injection - DIALYSIS  150 mcg Intravenous Q Mon-HD   feeding supplement (NEPRO CARB STEADY)  237 mL Oral TID AC   heparin  5,000 Units Subcutaneous Q8H   nicotine  14 mg Transdermal Daily   pantoprazole  40 mg Oral BID   sertraline  50 mg Oral Daily   sucralfate  1 g Oral TID WC & HS    Dialysis Orders: [facility]WF [facility]  4h 400/AF 1.5  67 kg   2K/3Ca bath  AVF  LUA   Hep none  - hectorol 2 ug IV tiw  - mircera 225 q2wk last given 8/2  Assessment/Plan:  Hypocalcemia -  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  Using 2.5 Ca bath w/HD as it is only available option.  Can increase Ca in bath on d/c.  Calcium now at goal. Will stop IV calcium gluconate Continue  calcitriol 1 mcg BID and increased calcium carbonate  to '2000mg'$  QID  Check Ca q4hrs until >6.5 on PO meds. Will be stable to d/c home with Ca 7.5-8.5 on PO meds alone. Fever - Afebrile x 24hrs. BC w/NGTD.  WBC WNL.  CXR with L lung atelectasis vs developing infiltrate, ?PNA.  ABX started.  ESRD -  on HD MWF but treatment moved to today due to high census. HD tomorrow per regular schedule.    Hypertension/volume  - BP variable. Trace edema on exam and rhonchi. Under EDW if weights correct. Continue UF as tolerated, will need lower dry on d/c.  Anemia of CKD - Last Hgb 9.5.  ESA ordered with HD today.     Secondary Hyperparathyroidism - hypocalcemia as noted above with recent PTX.  Supplements as above.  Phos in goal.   Nutrition - Renal diet w/fluid restrictions. Alb 3.1 give protein supplements.  Hx meningioma s/p resection 11/01/20   Anice Paganini, PA-C 03/25/2022, 8:35 AM  [facility] Pager: [phone]

## 2022-03-25 NOTE — Progress Notes (Signed)
MD on called paged bp 215/100 she was given '1mg'$  IV dilaudid for pain and I gave her Coreg 25 mg because this was missed 8/21 am dose due to anticipation of HD.I also informed HD RN of bp and that bp medication was given he stated that is ok. He stated she is scheduled first case this am I will reassess her vitals before she leaves for HD

## 2022-03-25 NOTE — Progress Notes (Signed)
Received patient in bed to unit. Alert and oriented. Informed consent signed and in chart.   Treatment initiated: 0727 Treatment completed: 1123  Patient tolerated well. Transported back to the room Alert, without acute distress. Report given to patient's floor nurse.   Access used: LUA Fistula Access issues: none   Total UF removed: 3.5L Medication(s) given: none Post HD VS: 179/93 97 98% 14 98.2  Post HD weight: 64.6 kg   [facility]

## 2022-03-25 NOTE — Progress Notes (Signed)
Subjective:   Summary: Ruth Gutierrez is a 56 y.o. year old female currently admitted on the IMTS HD#4 for symptomatic hypocalcemia s/p total parathyroidectomy done on 8/15.  Overnight Events: Missed HD because seats were full. Pt became Hypertensive and given Coreg 25  Pt was seen today in dialysis unit. She endorses feeling better, and denies any pain, nausea, or vomiting.   Objective:  Vital signs in last 24 hours: Vitals:   03/24/22 2137 03/25/22 0022 03/25/22 0417 03/25/22 0534  BP: (!) 202/98 (!) 185/89 (!) 215/100 (!) 190/89  Pulse: (!) 105   97  Resp: 18 18    Temp: 99 F (37.2 C) 98.8 F (37.1 C)  99.2 F (37.3 C)  TempSrc: Oral Oral  Oral  SpO2: 98% 98%  93%  Weight:       Supplemental O2: Room Air 93% O2 on room air   Physical Exam:  Constitutional: well-appearing female sitting in bed, in no acute distress Cardiovascular: RRR, no murmurs, rubs or gallops Pulmonary/Chest: normal work of breathing on room air, lungs clear to auscultation bilaterally Abdominal: soft, non-tender, non-distended Skin: warm and dry Extremities: upper/lower extremity pulses 2+, no lower extremity edema present  Filed Weights   03/21/22 1220 03/21/22 1723  Weight: 66.9 kg 63.5 kg     Intake/Output Summary (Last 24 hours) at 03/25/2022 0548 Last data filed at 03/24/2022 1300 Gross per 24 hour  Intake 340 ml  Output --  Net 340 ml   Net IO Since Admission: 2,478.35 mL [03/25/22 0548]  Pertinent Labs:    Latest Ref Rng & Units 03/21/2022   11:27 PM 03/21/2022    2:49 AM 03/20/2022    6:10 PM  CBC  WBC 4.0 - 10.5 K/uL 7.6  8.2    Hemoglobin 12.0 - 15.0 g/dL 9.5  10.7  10.9   Hematocrit 36.0 - 46.0 % 28.8  33.5  32.0   Platelets 150 - 400 K/uL 201  163         Latest Ref Rng & Units 03/25/2022    1:37 AM 03/24/2022    7:24 PM 03/24/2022    2:06 PM  CMP  Glucose 70 - 99 mg/dL 62     BUN 6 - 20 mg/dL 49     Creatinine 0.44 - 1.00 mg/dL 10.99      Sodium 135 - 145 mmol/L 134     Potassium 3.5 - 5.1 mmol/L 4.3     Chloride 98 - 111 mmol/L 93     CO2 22 - 32 mmol/L 21     Calcium 8.9 - 10.3 mg/dL 7.6  7.2  6.5     Assessment/Plan:   Principal Problem:   Hypocalcemia Active Problems:   Pneumonia involving left lung   ESRD on hemodialysis ([place])   Secondary renal hyperparathyroidism ([place])   H/O parathyroidectomy ([place])   Patient Summary: Ruth Gutierrez is a 56 y.o. with a pertinent PMH of ESRD on HD MWF, secondary hyperparathyroidism s/p total parathyroidectomy, failed renal transplant, HTN, COPD, and meningioma, who presented with nausea, diarrhea, and admitted for symptomatic hypocalcemia s/p total parathyroidectomy on 8/15.    #Symptomatic Hypocalcemia S/P Parathyroidectomy on 03/18/22 #ESRD Calcium today is 6.8, up from 6.5 yesterday. HD spots were filled yesterday, so she will be the first patient this AM. She was Hypertensive yesterday as her BP medications were held with anticipation for Dialysis, however dialysis  unit had a high census and was not able to retrieve her, so Coreg was given.   She is not having any nausea, vomiting, or abdominal pain. She has an outpatient follow up appointment with surgery for her parathyroidectomy on 04/03/22. Per nephrology, pt will be undergoing dialysis with calcium bath.   Plan: -Appreciate nephrology recommendations  - Discontinue IV Calcium Gluconate  - Continue Calcitriol 1 mcg BID  - Increase calcium carbonate to '2000mg'$  QID  - Check Ca q4H until >6.5 on PO meds  - Will be stable to discharge with Ca 7.5-8.4  #Bilateral Infiltrates on Chest X-Ray  Chest X-Ray on admission revealed a left lung base atelectasis vs possible developing infiltrate. CBC remains negative, and pt is afebrile at 99.2. She denies any chest pain or cough.   Plan:  - Pt denied antibiotics and is asymptomatic. Will observe  Code: Full   Dispo: Anticipated discharge in less than two midnights    Drucie Opitz, MD PGY-1 Internal Medicine Resident Pager Number [phone] Please contact the on call pager after 5 pm and on weekends at [phone].

## 2022-03-26 LAB — CBC
HCT: 26.6 % — ABNORMAL LOW (ref 36.0–46.0)
Hemoglobin: 8.5 g/dL — ABNORMAL LOW (ref 12.0–15.0)
MCH: 28.7 pg (ref 26.0–34.0)
MCHC: 32 g/dL (ref 30.0–36.0)
MCV: 89.9 fL (ref 80.0–100.0)
Platelets: 219 10*3/uL (ref 150–400)
RBC: 2.96 MIL/uL — ABNORMAL LOW (ref 3.87–5.11)
RDW: 16.9 % — ABNORMAL HIGH (ref 11.5–15.5)
WBC: 8.8 10*3/uL (ref 4.0–10.5)
nRBC: 0 % (ref 0.0–0.2)

## 2022-03-26 LAB — RENAL FUNCTION PANEL
Albumin: 2.3 g/dL — ABNORMAL LOW (ref 3.5–5.0)
Anion gap: 13 (ref 5–15)
BUN: 28 mg/dL — ABNORMAL HIGH (ref 6–20)
CO2: 26 mmol/L (ref 22–32)
Calcium: 6.1 mg/dL — CL (ref 8.9–10.3)
Chloride: 97 mmol/L — ABNORMAL LOW (ref 98–111)
Creatinine, Ser: 8.32 mg/dL — ABNORMAL HIGH (ref 0.44–1.00)
GFR, Estimated: 5 mL/min — ABNORMAL LOW (ref >60)
Glucose, Bld: 119 mg/dL — ABNORMAL HIGH (ref 70–99)
Phosphorus: 3.1 mg/dL (ref 2.5–4.6)
Potassium: 3.4 mmol/L — ABNORMAL LOW (ref 3.5–5.1)
Sodium: 136 mmol/L (ref 135–145)

## 2022-03-26 LAB — CULTURE, BLOOD (ROUTINE X 2)
Culture: NO GROWTH
Culture: NO GROWTH

## 2022-03-26 LAB — CALCIUM: Calcium: 6.5 mg/dL — ABNORMAL LOW (ref 8.9–10.3)

## 2022-03-26 MED ORDER — DARBEPOETIN ALFA 150 MCG/0.3ML IJ SOSY
150.0000 ug | PREFILLED_SYRINGE | INTRAMUSCULAR | Status: DC
  Filled 2022-03-26: qty 0.3

## 2022-03-26 MED ORDER — CALCIUM CARBONATE ANTACID 500 MG PO CHEW
2200.0000 mg | CHEWABLE_TABLET | Freq: Four times a day (QID) | ORAL | Status: DC
  Administered 2022-03-26 (×3): 2200 mg via ORAL
  Filled 2022-03-26 (×2): qty 11

## 2022-03-26 NOTE — Progress Notes (Signed)
Mobility Specialist - Progress Note   03/26/22 1600  Mobility  Activity Ambulated independently in hallway  Level of Assistance Modified independent, requires aide device or extra time  Assistive Device Front wheel walker  Distance Ambulated (ft) 550 ft  Activity Response Tolerated well  $Mobility charge 1 Mobility    Pt received in bed agreeable to mobility. Left EOB w/ call bell in reach and all needs met.   Paulla Dolly Mobility Specialist

## 2022-03-26 NOTE — Progress Notes (Signed)
Pt and family at bedside requested to speak to MD in regards to being discharged. MD came to bedside to update pt on plan of care. Pt agreeable

## 2022-03-26 NOTE — Progress Notes (Signed)
Patient refused her Heparin injection On - call provider notified,will continue to monitor.

## 2022-03-26 NOTE — Progress Notes (Cosign Needed Addendum)
Subjective:   Summary: Ruth Gutierrez is a 56 y.o. year old female currently admitted on the IMTS HD#5 for symptomatic hypocalcemia s/p total parathyroidectomy done on 8/15.  Overnight Events: NOE   Pt was seen in dialysis unit today. She states she is feeling fine, and denies any vomiting. She states she is tolerating her diet well.She denies any muscle pain or spasm.   Objective:  Vital signs in last 24 hours: Vitals:   03/26/22 0930 03/26/22 1000 03/26/22 1030 03/26/22 1123  BP: (!) 155/86 (!) 158/97 (!) 178/92 (!) 173/102  Pulse: 94 84 90 88  Resp: '17 17 18 18  '$ Temp:    97.9 F (36.6 C)  TempSrc:      SpO2: 91% 92% 93% 95%  Weight:    62.2 kg   Supplemental O2: Room Air O2: 96% on Room Air   Physical Exam:  Constitutional: well-appearing female receiving dialysis, in no acute distress Cardiovascular: RRR, no murmurs, rubs or gallops Pulmonary/Chest: normal work of breathing on room air, lungs clear to auscultation bilaterally Abdominal: soft, non-tender, non-distended Skin: warm and dry Extremities: upper/lower extremity pulses 2+, no lower extremity edema present, Fistula for HD on LU Extremity   Filed Weights   03/25/22 1130 03/26/22 0732 03/26/22 1123  Weight: 64.6 kg 65.3 kg 62.2 kg     Intake/Output Summary (Last 24 hours) at 03/26/2022 1538 Last data filed at 03/26/2022 1123 Gross per 24 hour  Intake --  Output 3000 ml  Net -3000 ml   Net IO Since Admission: -3,901.65 mL [03/26/22 1538]  Pertinent Labs:    Latest Ref Rng & Units 03/26/2022    8:15 AM 03/21/2022   11:27 PM 03/21/2022    2:49 AM  CBC  WBC 4.0 - 10.5 K/uL 8.8  7.6  8.2   Hemoglobin 12.0 - 15.0 g/dL 8.5  9.5  10.7   Hematocrit 36.0 - 46.0 % 26.6  28.8  33.5   Platelets 150 - 400 K/uL 219  201  163        Latest Ref Rng & Units 03/26/2022    8:15 AM 03/25/2022    6:23 PM 03/25/2022   11:20 AM  CMP  Glucose 70 - 99 mg/dL 119     BUN 6 - 20 mg/dL 28      Creatinine 0.44 - 1.00 mg/dL 8.32     Sodium 135 - 145 mmol/L 136     Potassium 3.5 - 5.1 mmol/L 3.4     Chloride 98 - 111 mmol/L 97     CO2 22 - 32 mmol/L 26     Calcium 8.9 - 10.3 mg/dL 6.1  6.7  7.5      Assessment/Plan:   Principal Problem:   Hypocalcemia Active Problems:   Pneumonia involving left lung   ESRD on hemodialysis ([facility])   Secondary renal hyperparathyroidism ([facility])   H/O parathyroidectomy ([place])   Patient Summary: Ruth Gutierrez is a 56 y.o. with a pertinent PMH of ESRD on HD MWF, secondary hyperparathyroidism s/p total parathyroidectomy, failed renal transplant, HTN, COPD, and meningioma who presented with nausea, vomiting, and diarrhea, and admitted for symptomatic hypocalcemia s/p total parathyroidectomy on 8/15.    #Symptomatic Hypocalcemia S/P Parathyroidectomy on 03/18/22 #ESRD Calcium levels were hovering around 7.5 yesterday throughout the day, however this AM it was 6.1. Nephrology is currently managing her electrolyte imbalance. She denies any nausea, vomiting,  diarrhea, muscle spasms or pain. [place] sign is negative. Yesterday, she was very interested in leaving AMA, was able to reassure that progress was being made.   Plan:  - Appreciate nephrology recommendations  - Continue Dialysis MWF - Continue Calcitriol 86mg BID  - Increase calcium carbonate to '2200mg'$  QID - Will be stable to discharge with Ca 7.5-8.5 on PO medications - Switched diet to renal/carb modified   Full Code   Dispo: Anticipated discharge in less than two midnights  SDrucie Opitz MD PGY-1 Internal Medicine Resident Pager Number [phone] contact the on call pager after 5 pm and on weekends at [phone]

## 2022-03-26 NOTE — Progress Notes (Addendum)
[facility] Progress Note   Subjective:   Seen on HD. Last calcium 6.7, AM labs pending. Tolerating HD well, denies SOB, CP, dizziness, abdominal pain, numbness and tingling. Hoping to go home soon.   Objective Vitals:   03/25/22 2015 03/26/22 0537 03/26/22 0732 03/26/22 0749  BP: (!) 141/72 (!) 162/80 (!) 146/86 (!) 153/84  Pulse: 93 90 88 93  Resp: '18 18 14 18  '$ Temp: (!) 100.4 F (38 C) 97.6 F (36.4 C) 98.3 F (36.8 C)   TempSrc: Oral Oral    SpO2: 97% 93% 93% 94%  Weight:   65.3 kg    Physical Exam General: Alert female in NAD Heart: RRR, no murmurs, rubs or gallops  Lungs: CTA bilaterally without wheezing, rhonchi or rales Abdomen: Soft, non-distended, +BS Extremities: No edema b/l lower extremities Dialysis Access:  LUE AVF accessed  Additional Objective Labs: Basic Metabolic Panel: Recent Labs  Lab 03/23/22 0652 03/23/22 1319 03/24/22 0139 03/24/22 1125 03/25/22 0137 03/25/22 0546 03/25/22 0948 03/25/22 1120 03/25/22 1823  NA 130*  --  133*  --  134*  --   --   --   --   K 3.5  --  3.7  --  4.3  --   --   --   --   CL 88*  --  91*  --  93*  --   --   --   --   CO2 23  --  26  --  21*  --   --   --   --   GLUCOSE 95  --  94  --  62*  --   --   --   --   BUN 30*  --  36*  --  49*  --   --   --   --   CREATININE 8.17*  --  9.52*  --  10.99*  --   --   --   --   CALCIUM 6.1*   < > 6.2*   < > 7.6*   < > 7.4* 7.5* 6.7*  PHOS 3.9  --  3.6  --  3.9  --   --   --   --    < > = values in this interval not displayed.   Liver Function Tests: Recent Labs  Lab 03/20/22 1753 03/21/22 0249 03/21/22 1233 03/23/22 0652 03/24/22 0139 03/25/22 0137  AST 22 23  --   --   --   --   ALT 17 19  --   --   --   --   ALKPHOS 235* 278*  --   --   --   --   BILITOT 0.6 1.1  --   --   --   --   PROT 7.3 7.5  --   --   --   --   ALBUMIN 3.2* 3.2*   < > 3.1* 2.5* 2.4*   < > = values in this interval not displayed.   Recent Labs  Lab 03/20/22 1753   LIPASE 34   CBC: Recent Labs  Lab 03/20/22 1753 03/20/22 1810 03/21/22 0249 03/21/22 2327  WBC 7.3  --  8.2 7.6  HGB 10.1* 10.9* 10.7* 9.5*  HCT 32.3* 32.0* 33.5* 28.8*  MCV 91.5  --  91.3 88.6  PLT 158  --  163 201   Blood Culture    Component Value Date/Time   SDES BLOOD RIGHT HAND 03/21/2022 2340   SPECREQUEST  03/21/2022 [place] Blood Culture results may not be optimal due to an inadequate volume of blood received in culture bottles   CULT  03/21/2022 2340    NO GROWTH 5 DAYS Performed at [facility], [employer] [place] [employer] [address] [place], [place] [address]    REPTSTATUS 03/26/2022 FINAL 03/21/2022 2340    Cardiac Enzymes: No results for input(s): "CKTOTAL", "CKMB", "CKMBINDEX", "TROPONINI" in the last 168 hours. CBG: No results for input(s): "GLUCAP" in the last 168 hours. Iron Studies: No results for input(s): "IRON", "TIBC", "TRANSFERRIN", "FERRITIN" in the last 72 hours. '@lablastinr3'$ @ Studies/Results: No results found. Medications:   acetaminophen  1,000 mg Oral Q8H   amLODipine  10 mg Oral Daily   calcitRIOL  1 mcg Oral BID BM   calcium carbonate  2,000 mg of elemental calcium Oral QID   carvedilol  25 mg Oral BID WC   Chlorhexidine Gluconate Cloth  6 each Topical Q0600   darbepoetin (ARANESP) injection - DIALYSIS  150 mcg Intravenous Q Mon-HD   feeding supplement (NEPRO CARB STEADY)  237 mL Oral TID AC   heparin  5,000 Units Subcutaneous Q8H   nicotine  14 mg Transdermal Daily   pantoprazole  40 mg Oral BID   sertraline  50 mg Oral Daily   sucralfate  1 g Oral TID WC & HS    Dialysis Orders: [facility]WF [facility]  4h 400/AF 1.5  67 kg   2K/3Ca bath  AVF  LUA   Hep none  - hectorol 2 ug IV tiw  - mircera 225 q2wk last given 8/2  Assessment/Plan:  Hypocalcemia -  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  Using 2.5 Ca bath w/HD as it is only available option.  Can increase Ca in  bath on d/c.   Calcium stabilizing off of IV calcium Continue calcitriol 1 mcg BID and increased calcium carbonate  to '2000mg'$  QID  Check Ca q4hrs until >6.5 on PO meds. Will be stable to d/c home with corrected calcium 7.5-8.5 on PO meds alone, hopefully today if labs are stable Fever - Afebrile x 24hrs. BC w/NGTD.  WBC WNL.  CXR with L lung atelectasis vs developing infiltrate. Completed antibiotics, currently asymptomatic ESRD -  on HD MWF but treatment moved to Tuesday due to high census. Continue HD on MWF schedule  Hypertension/volume  - BP variable. Under EDW if weights correct. Continue UF as tolerated, will need lower dry on d/c.  Anemia of CKD - Last Hgb 9.5.  Continue ESA with HD    Secondary Hyperparathyroidism - hypocalcemia as noted above with recent PTX.  Supplements as above.  Phos in goal.   Nutrition - Renal diet w/fluid restrictions. Alb 3.1 give protein supplements.  Hx meningioma s/p resection 11/01/20   Addendum: Calcium 6.1 this AM. Increased PO calcium carbonate dose.     Anice Paganini, PA-C 03/26/2022, 8:21 AM   [facility] Pager: [phone]

## 2022-03-26 NOTE — Progress Notes (Signed)
Patient transfer to HD,alert and oriented,no c/o of pain.Report given to HD nurse.

## 2022-03-27 LAB — CALCIUM
Calcium: 5.9 mg/dL — CL (ref 8.9–10.3)
Calcium: 5.9 mg/dL — CL (ref 8.9–10.3)

## 2022-03-27 MED ORDER — CALCIUM CARBONATE ANTACID 500 MG PO CHEW
2400.0000 mg | CHEWABLE_TABLET | Freq: Three times a day (TID) | ORAL | Status: DC
  Administered 2022-03-27: 2400 mg via ORAL
  Filled 2022-03-27 (×4): qty 12

## 2022-03-27 MED ORDER — CALCITRIOL 0.5 MCG PO CAPS
2.0000 ug | ORAL_CAPSULE | Freq: Two times a day (BID) | ORAL | Status: DC
  Administered 2022-03-27 – 2022-03-28 (×4): 2 ug via ORAL
  Filled 2022-03-27 (×4): qty 4

## 2022-03-27 MED ORDER — CHLORHEXIDINE GLUCONATE CLOTH 2 % EX PADS
6.0000 | MEDICATED_PAD | Freq: Every day | CUTANEOUS | Status: DC
Start: 2022-03-27 — End: 2022-03-29
  Administered 2022-03-27 – 2022-03-28 (×2): 6 via TOPICAL

## 2022-03-27 MED ORDER — CALCIUM CARBONATE ANTACID 1250 MG/5ML PO SUSP
2400.0000 mg | Freq: Four times a day (QID) | ORAL | Status: DC
  Administered 2022-03-27 – 2022-03-28 (×5): 2400 mg via ORAL
  Filled 2022-03-27 (×4): qty 25
  Filled 2022-03-27: qty 24
  Filled 2022-03-27 (×2): qty 10
  Filled 2022-03-27 (×2): qty 25

## 2022-03-27 NOTE — Progress Notes (Addendum)
   03/27/22 1000  Mobility  Activity Ambulated independently in hallway  Level of Assistance Modified independent, requires aide device or extra time  Assistive Device Front wheel walker  Distance Ambulated (ft) 150 ft  Activity Response Tolerated well  $Mobility charge 1 Mobility   Mobility Specialist Progress Note  Received pt in bed having no complaints and agreeable to mobility. Pt was asymptomatic throughout ambulation and returned to room w/o fault. Left in EOB w/ call bell in reach and all needs met.   Lucious Groves Mobility Specialist

## 2022-03-27 NOTE — Progress Notes (Signed)
Patient calcium level of 5.9 reported to provider, MD aware,will continue to monitor.

## 2022-03-27 NOTE — Progress Notes (Signed)
Subjective:   Summary: Ruth Gutierrez is a 56 y.o. year old female currently admitted on the IMTS HD#6 for symptomatic hypocalcemia s/p total parathyroidectomy done on 8/15.  Overnight Events: NOE   Pt was seen today bedside. She states she is feeling well, and symptoms of nausea, vomiting, muscle pain have resolved.   Objective:  Vital signs in last 24 hours: Vitals:   03/26/22 1703 03/26/22 2106 03/27/22 0439 03/27/22 0831  BP: (!) 170/96 (!) 142/89 (!) 147/91 (!) 156/93  Pulse: (!) 101 81 87 84  Resp: '16 17 16 18  '$ Temp: 98.8 F (37.1 C) 99.6 F (37.6 C) 98.9 F (37.2 C) 98.4 F (36.9 C)  TempSrc: Oral Oral Oral Oral  SpO2: 97% 90% 95% 97%  Weight:       Supplemental O2: Room Air 97% On Room Air   Physical Exam:  Constitutional: well-appearing female,  sitting in bed, in no acute distress Cardiovascular: RRR, no murmurs, rubs or gallops Pulmonary/Chest: normal work of breathing on room air, lungs clear to auscultation bilaterally Abdominal: soft, non-tender, non-distended Skin: warm and dry Extremities: upper/lower extremity pulses 2+, no lower extremity edema present  Filed Weights   03/25/22 1130 03/26/22 0732 03/26/22 1123  Weight: 64.6 kg 65.3 kg 62.2 kg     Intake/Output Summary (Last 24 hours) at 03/27/2022 1457 Last data filed at 03/27/2022 0839 Gross per 24 hour  Intake 120 ml  Output --  Net 120 ml   Net IO Since Admission: -3,781.65 mL [03/27/22 1457]  Pertinent Labs:    Latest Ref Rng & Units 03/26/2022    8:15 AM 03/21/2022   11:27 PM 03/21/2022    2:49 AM  CBC  WBC 4.0 - 10.5 K/uL 8.8  7.6  8.2   Hemoglobin 12.0 - 15.0 g/dL 8.5  9.5  10.7   Hematocrit 36.0 - 46.0 % 26.6  28.8  33.5   Platelets 150 - 400 K/uL 219  201  163        Latest Ref Rng & Units 03/27/2022   12:56 PM 03/27/2022    5:32 AM 03/26/2022    7:52 PM  CMP  Calcium 8.9 - 10.3 mg/dL 5.8  5.9  6.5       Assessment/Plan:   Principal Problem:    Hypocalcemia Active Problems:   Pneumonia involving left lung   ESRD on hemodialysis ([place])   Secondary renal hyperparathyroidism ([facility])   H/O parathyroidectomy ([place])   Patient Summary: Ruth Gutierrez is a 56 y.o. with a pertinent PMH of ESRD on HD MWF, secondary hyperparathyroidism s/p total parathyroidectomy, failed renal transplant, HTN, COPD, and meningioma, who presented with nausea, vomiting, and diarrhea, and admitted for symptomatic hypocalcemia s/p total parathyroidectomy on 8/15.    #Symptomatic Hypocalcemia S/P Parathyroidectomy on 03/18/22 #ESRD Calcium levels today was 5.9. Unsure the reason why her levels are currently fluctuating, could be because of her ESRD. Nephrology is managing her electrolyte imbalance. She denies any nausea, vomiting, diarrhea, muscle spasms, or pain. Discussions with nephrology are on going, may consider switching to liquid calcium.   Plan:  - Appreciate nephrology recommendations  - Continue Dialysis MWF  - Increased Calcitriol to 25mg BID   - Increased calcium carbonate to '2400mg'$  QID - Will be stable to D/C with Ca 7.5-8.5 Code: Full    Dispo: Anticipated discharge in less than two midnights  SDrucie Opitz MD PGY-1 Internal  Medicine Resident Pager Number [phone] Please contact the on call pager after 5 pm and on weekends at [phone].

## 2022-03-27 NOTE — Progress Notes (Signed)
[facility] Progress Note   Subjective:   Ca trended back down to 5.9, corrected calcium 8.1. Increased her PO calcium and calcitriol dose. Pt continues to deny numbness/tingling. No SOB, CP, palpitations, or dizziness.   Objective Vitals:   03/26/22 1703 03/26/22 2106 03/27/22 0439 03/27/22 0831  BP: (!) 170/96 (!) 142/89 (!) 147/91 (!) 156/93  Pulse: (!) 101 81 87 84  Resp: '16 17 16 18  '$ Temp: 98.8 F (37.1 C) 99.6 F (37.6 C) 98.9 F (37.2 C) 98.4 F (36.9 C)  TempSrc: Oral Oral Oral Oral  SpO2: 97% 90% 95% 97%  Weight:       Physical Exam General: Alert female in NAD Heart: RRR, no murmurs, rubs or gallops  Lungs: CTA bilaterally without wheezing, rhonchi or rales Abdomen: Soft, non-distended, +BS Extremities: No edema b/l lower extremities Dialysis Access:  LUE AVF + bruit  Additional Objective Labs: Basic Metabolic Panel: Recent Labs  Lab 03/24/22 0139 03/24/22 1125 03/25/22 0137 03/25/22 0546 03/26/22 0815 03/26/22 1952 03/27/22 0532  NA 133*  --  134*  --  136  --   --   K 3.7  --  4.3  --  3.4*  --   --   CL 91*  --  93*  --  97*  --   --   CO2 26  --  21*  --  26  --   --   GLUCOSE 94  --  62*  --  119*  --   --   BUN 36*  --  49*  --  28*  --   --   CREATININE 9.52*  --  10.99*  --  8.32*  --   --   CALCIUM 6.2*   < > 7.6*   < > 6.1* 6.5* 5.9*  PHOS 3.6  --  3.9  --  3.1  --   --    < > = values in this interval not displayed.   Liver Function Tests: Recent Labs  Lab 03/20/22 1753 03/21/22 0249 03/21/22 1233 03/24/22 0139 03/25/22 0137 03/26/22 0815  AST 22 23  --   --   --   --   ALT 17 19  --   --   --   --   ALKPHOS 235* 278*  --   --   --   --   BILITOT 0.6 1.1  --   --   --   --   PROT 7.3 7.5  --   --   --   --   ALBUMIN 3.2* 3.2*   < > 2.5* 2.4* 2.3*   < > = values in this interval not displayed.   Recent Labs  Lab 03/20/22 1753  LIPASE 34   CBC: Recent Labs  Lab 03/20/22 1753 03/20/22 1810 03/21/22 0249  03/21/22 2327 03/26/22 0815  WBC 7.3  --  8.2 7.6 8.8  HGB 10.1*   < > 10.7* 9.5* 8.5*  HCT 32.3*   < > 33.5* 28.8* 26.6*  MCV 91.5  --  91.3 88.6 89.9  PLT 158  --  163 201 219   < > = values in this interval not displayed.   Blood Culture    Component Value Date/Time   SDES BLOOD RIGHT HAND 03/21/2022 2340   SPECREQUEST  03/21/2022 2340    BOTTLES DRAWN AEROBIC ONLY Blood Culture results may not be optimal due to an inadequate volume of blood received in culture bottles  CULT  03/21/2022 2340    NO GROWTH 5 DAYS Performed at [facility], [place] [address], [place], [place] [address]    REPTSTATUS 03/26/2022 FINAL 03/21/2022 2340    Cardiac Enzymes: No results for input(s): "CKTOTAL", "CKMB", "CKMBINDEX", "TROPONINI" in the last 168 hours. CBG: No results for input(s): "GLUCAP" in the last 168 hours. Iron Studies: No results for input(s): "IRON", "TIBC", "TRANSFERRIN", "FERRITIN" in the last 72 hours. '@lablastinr3'$ @ Studies/Results: No results found. Medications:   acetaminophen  1,000 mg Oral Q8H   amLODipine  10 mg Oral Daily   calcitRIOL  2 mcg Oral BID BM   calcium carbonate  2,400 mg of elemental calcium Oral TID AC & HS   carvedilol  25 mg Oral BID WC   Chlorhexidine Gluconate Cloth  6 each Topical Q0600   [START ON 03/28/2022] darbepoetin (ARANESP) injection - DIALYSIS  150 mcg Intravenous Q Fri-HD   feeding supplement (NEPRO CARB STEADY)  237 mL Oral TID AC   heparin  5,000 Units Subcutaneous Q8H   nicotine  14 mg Transdermal Daily   pantoprazole  40 mg Oral BID   sertraline  50 mg Oral Daily    Dialysis Orders: [facility]WF [facility]  4h 400/AF 1.5  67 kg   2K/3Ca bath  AVF  LUA   Hep none  - hectorol 2 ug IV tiw  - mircera 225 q2wk last given 8/2  Assessment/Plan:  Hypocalcemia -  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  Using 2.5 Ca bath w/HD as it is only available option.  Can increase Ca in bath  on d/c.   Calcium stabilizing off of IV calcium but trending back down Increased calcitriol to 2 mcg BID and increased calcium carbonate  to '2400mg'$  QID  Will be stable to d/c home with corrected calcium 7.5-8.5 on PO meds alone, hopefully today if labs are stable Fever - Afebrile x 24hrs. BC w/NGTD.  WBC WNL.  CXR with L lung atelectasis vs developing infiltrate. Completed antibiotics, currently asymptomatic ESRD -  on HD MWF but treatment moved to Tuesday due to high census. Continue HD on MWF schedule  Hypertension/volume  - BP variable. Under EDW if weights correct. Continue UF as tolerated, will need lower dry on d/c.  Anemia of CKD - Last Hgb 8.5.  Continue ESA with HD - due next HD   Secondary Hyperparathyroidism - hypocalcemia as noted above with recent PTX.  Supplements as above.  Phos in goal.   Nutrition - Renal diet w/fluid restrictions. Alb 3.1 give protein supplements.  Hx meningioma s/p resection 11/01/20   Anice Paganini, PA-C 03/27/2022, 10:07 AM  [facility] Pager: [phone]

## 2022-03-28 ENCOUNTER — Other Ambulatory Visit (HOSPITAL_COMMUNITY): Payer: Self-pay

## 2022-03-28 DIAGNOSIS — F32A Depression, unspecified: Secondary | ICD-10-CM

## 2022-03-28 DIAGNOSIS — F17210 Nicotine dependence, cigarettes, uncomplicated: Secondary | ICD-10-CM

## 2022-03-28 DIAGNOSIS — I10 Essential (primary) hypertension: Secondary | ICD-10-CM

## 2022-03-28 DIAGNOSIS — K259 Gastric ulcer, unspecified as acute or chronic, without hemorrhage or perforation: Secondary | ICD-10-CM

## 2022-03-28 LAB — CALCIUM
Calcium: 5.8 mg/dL — CL (ref 8.9–10.3)
Calcium: 5.9 mg/dL — CL (ref 8.9–10.3)
Calcium: 6.2 mg/dL — CL (ref 8.9–10.3)
Calcium: 6 mg/dL — CL (ref 8.9–10.3)

## 2022-03-28 MED ORDER — LIDOCAINE-PRILOCAINE 2.5-2.5 % EX CREA: 1.0000 | TOPICAL_CREAM | CUTANEOUS | Status: DC | PRN

## 2022-03-28 MED ORDER — PENTAFLUOROPROP-TETRAFLUOROETH EX AERO: 1.0000 | INHALATION_SPRAY | CUTANEOUS | Status: DC | PRN

## 2022-03-28 MED ORDER — CALCITRIOL 0.5 MCG PO CAPS
1.0000 ug | ORAL_CAPSULE | ORAL | Status: DC
Start: 2022-03-28 — End: 2022-03-29

## 2022-03-28 MED ORDER — CALCIUM CARBONATE ANTACID 1250 MG/5ML PO SUSP: 2400.0000 mg | Freq: Four times a day (QID) | ORAL | 3 refills | Status: AC

## 2022-03-28 MED ORDER — CALCITRIOL 0.5 MCG PO CAPS: 1.0000 ug | ORAL_CAPSULE | ORAL | 0 refills | Status: DC

## 2022-03-28 MED ORDER — LIDOCAINE HCL (PF) 1 % IJ SOLN: 5.0000 mL | INTRAMUSCULAR | Status: DC | PRN

## 2022-03-28 MED ORDER — CALCITRIOL 0.5 MCG PO CAPS: 2.0000 ug | ORAL_CAPSULE | Freq: Two times a day (BID) | ORAL | 0 refills | Status: DC

## 2022-03-28 NOTE — Progress Notes (Signed)
D/C order noted. Contacted [facility] to advise clinic of pt's d/c today and that pt will resume care on Monday. Contacted inpt HD unit and they have already made preparations for pt's treatment. Pt to receive inpt HD prior to d/c.   Melven Sartorius Renal Navigator [phone]

## 2022-03-28 NOTE — Telephone Encounter (Signed)
Unable to reach Pt: Pt mailbox is full

## 2022-03-28 NOTE — Progress Notes (Addendum)
Subjective: No complaints, denies any numbness tingling.  Repeat calcium stable again today 5.9 with calcium corrected for ESRD patient with a low albumin at 8.1.  With current stable level last 2 days as discussed with Dr. Melvia Heaps okay for discharge by renal.  Has been getting liquid form of calcium per patient tolerating it past few doses.  Today patient wants to be discharged by 12 noon when her daughter comes  Objective Vital signs in last 24 hours: Vitals:   03/27/22 1622 03/27/22 2055 03/28/22 0616 03/28/22 0813  BP: 114/70 116/73 (!) 144/87 (!) 141/85  Pulse: 73 76 84 80  Resp: '17 17 18 16  '$ Temp: 98.8 F (37.1 C) 99 F (37.2 C) 98.2 F (36.8 C) 98.8 F (37.1 C)  TempSrc: Oral Oral Oral Oral  SpO2: 93% 90% 95% 96%  Weight:       Weight change:   Physical Exam: General: Alert stable female in NAD Heart: RRR no MRG Lungs: CTA bilaterally Abdomen: NABS soft NT, ND Extremities: No pedal edema Dialysis Access: LUE AV fistula positive bruit  Dialysis Orders: MWF [facility]  4h 400/AF 1.5  67 kg   2K/3Ca bath  AVF  LUA   Hep none  - hectorol 2 ug IV tiw  - mircera 225 q2wk last given 8/2  Problem/Plan: Hypocalcemia/secondary to hungry bone syndrome status post recent total parathyroidectomy leaving AMA post op= calcium last 2 days 5.9 but corrected with low albumin and ESRD patient stable 8.1 she is asymptomatic. as discussed with Dr. Melvia Heaps okay for discharge per renal.  Discharge team today needs to make sure she has p.o. liquid calcium as dosed currently , nephrology team will be checking calcium levels at outpatient kidney center , and use high calcium bath as able on HD / Currently on calcitriol 2 mics twice daily between meals, calcium carbonate 2400 mg (elemental calcium )suspension 4 times daily  will add 1 mcg calcitriol MWF q. outpatient HD ESRD -HD now on schedule MWF HTN/volume -BP variable has been under EDW for the correct follow-up weight today after UF with lower EDW at  discharge Febrile illness-left lung atelectasis versus infiltrate chest x-ray completed antibiotics asymptomatic, afebrile  Anemia -Hgb 8.5 ESA due to next HD here 150 mcg.  Hgb follow-up as an outpatient Secondary hyperparathyroidism -see as above hypocalcemia with that plan.  Phosphorus in goal History of meningioma status postresection 11/01/2020  Addendum= Evidently liquid calcium carbonate not available till Monday, she will need to use p.o. tablets at discharge for the remainder of time until able to pick up liquid calcium at [address].  I informed her of this today. Also we will start calcitriol 1 mic p.o. at outpatient dialysis and give a dose today at inpatient dialysis  Ernest Haber, PA-C [place] [phone] 03/28/2022,10:25 AM  LOS: 7 days   Labs: Basic Metabolic Panel: Recent Labs  Lab 03/24/22 0139 03/24/22 1125 03/25/22 0137 03/25/22 0546 03/26/22 0815 03/26/22 1952 03/27/22 1256 03/27/22 1843 03/28/22 0903  NA 133*  --  134*  --  136  --   --   --   --   K 3.7  --  4.3  --  3.4*  --   --   --   --   CL 91*  --  93*  --  97*  --   --   --   --   CO2 26  --  21*  --  26  --   --   --   --  GLUCOSE 94  --  62*  --  119*  --   --   --   --   BUN 36*  --  49*  --  28*  --   --   --   --   CREATININE 9.52*  --  10.99*  --  8.32*  --   --   --   --   CALCIUM 6.2*   < > 7.6*   < > 6.1*   < > 5.8* 5.9* 5.9*  PHOS 3.6  --  3.9  --  3.1  --   --   --   --    < > = values in this interval not displayed.   Liver Function Tests: Recent Labs  Lab 03/24/22 0139 03/25/22 0137 03/26/22 0815  ALBUMIN 2.5* 2.4* 2.3*   No results for input(s): "LIPASE", "AMYLASE" in the last 168 hours. No results for input(s): "AMMONIA" in the last 168 hours. CBC: Recent Labs  Lab 03/21/22 2327 03/26/22 0815  WBC 7.6 8.8  HGB 9.5* 8.5*  HCT 28.8* 26.6*  MCV 88.6 89.9  PLT 201 219   Cardiac Enzymes: No results for input(s): "CKTOTAL", "CKMB",  "CKMBINDEX", "TROPONINI" in the last 168 hours. CBG: No results for input(s): "GLUCAP" in the last 168 hours.  Studies/Results: No results found. Medications:   acetaminophen  1,000 mg Oral Q8H   amLODipine  10 mg Oral Daily   calcitRIOL  2 mcg Oral BID BM   calcium carbonate (dosed in mg elemental calcium)  2,400 mg of elemental calcium Oral QID   carvedilol  25 mg Oral BID WC   Chlorhexidine Gluconate Cloth  6 each Topical Q0600   Chlorhexidine Gluconate Cloth  6 each Topical Q0600   darbepoetin (ARANESP) injection - DIALYSIS  150 mcg Intravenous Q Fri-HD   feeding supplement (NEPRO CARB STEADY)  237 mL Oral TID AC   heparin  5,000 Units Subcutaneous Q8H   nicotine  14 mg Transdermal Daily   pantoprazole  40 mg Oral BID   sertraline  50 mg Oral Daily

## 2022-03-28 NOTE — Discharge Summary (Cosign Needed Addendum)
Name: Ruth Gutierrez MRN: [id number] DOB: Dec 23, 1965 56 y.o. PCP: Gaylan Gerold, DO  Date of Admission: 03/20/2022  4:57 PM Date of Discharge: 03-28-2022 Attending Physician: Angelica Pou, MD  Discharge Diagnosis: 1. Hypocalcemia secondary to total parathyroidectomy  Discharge Medications: Allergies as of 03/28/2022       Reactions   Penicillins Itching, Rash   Did it involve swelling of the face/tongue/throat, SOB, or low BP?Y Did it involve sudden or severe rash/hives, skin peeling, or any reaction on the inside of your mouth or nose? Y Did you need to seek medical attention at a hospital or doctor's office? Y When did it last happen?  2016     If all above answers are "NO", may proceed with cephalosporin use.        Medication List     STOP taking these medications    acetaminophen 325 MG tablet Commonly known as: TYLENOL   acetaminophen 500 MG tablet Commonly known as: TYLENOL   cloNIDine 0.1 MG tablet Commonly known as: CATAPRES   ondansetron 4 MG disintegrating tablet Commonly known as: ZOFRAN-ODT   sevelamer carbonate 800 MG tablet Commonly known as: RENVELA       TAKE these medications    albuterol 108 (90 Base) MCG/ACT inhaler Commonly known as: VENTOLIN HFA Inhale 3 puffs into the lungs daily as needed for wheezing or shortness of breath.   amLODipine 10 MG tablet Commonly known as: NORVASC Take 1 tablet (10 mg total) by mouth daily.   BLINK TEARS OP Place 1 drop into both eyes daily as needed (tired eyes).   calcitRIOL 0.5 MCG capsule Commonly known as: ROCALTROL Take 4 capsules (2 mcg total) by mouth 2 (two) times daily between meals.   calcitRIOL 0.5 MCG capsule Commonly known as: ROCALTROL Take 2 capsules (1 mcg total) by mouth every Monday, Wednesday, and Friday.   calcium carbonate (dosed in mg elemental calcium) 1250 MG/5ML Susp Take 24 mLs (2,400 mg of elemental calcium total) by mouth 4 (four) times daily. Start  taking on: March 31, 2022   carvedilol 25 MG tablet Commonly known as: Coreg Take 1 tablet (25 mg total) by mouth 2 (two) times daily.   diclofenac Sodium 1 % Gel Commonly known as: VOLTAREN Apply 2 g topically 2 (two) times daily as needed (Right shoulder pain). What changed:  how much to take when to take this   diphenhydrAMINE 25 mg capsule Commonly known as: BENADRYL Take 50 mg by mouth 3 (three) times daily as needed for allergies.   lidocaine-prilocaine cream Commonly known as: EMLA Apply 1 application. topically every Monday, Wednesday, and Friday with hemodialysis.   pantoprazole 40 MG tablet Commonly known as: PROTONIX Take 1 tablet (40 mg total) by mouth 2 (two) times daily before a meal.   sertraline 50 MG tablet Commonly known as: Zoloft Take 1 tablet (50 mg total) by mouth daily.        Disposition and follow-up:   RuthRuth Gutierrez was discharged from [facility] in Stable condition.  At the hospital follow up visit please address:  1.  Hypocalcemia - please ensure that patient has been able to take all of her calcium supplements including both the liquid and pills  2. Gastric Ulcer - patient should be scheduled for an EGD, originally scheduled for 8-3 but it was cancelled because patient had not fasted beforehand  3.  Labs / imaging needed at time of follow-up: BMP for hypocalcemia, EGD for gastric  ulcers  4.  Pending labs/ test needing follow-up: None   Follow-up Appointments:   Follow-up Information     Jackquline Denmark, MD Follow up.   Specialties: Gastroenterology, Internal Medicine Contact information: [address]. [place] [address] [phone]         [place], [facility] Follow up.   Contact information: [place] [address] [place] Follow up.   Contact information: [address] [place] Hospital Course by problem list:  1. Symptomatic hypocalcemia post parathyroidectomy on 8-15 Patient presented to the ED three days after undergoing a total parathyroidectomy with epigastric pain, nausea, and diarrhea. Calcium level was 5.3 at presentation, dropped to 4.9, and peaked at 7.7 before eventually stabilizing at 5.8-5.9. Nephrology managed her electrolytes and established a calcium supplementation regimen consisting of liquid calcium carbonate '2400mg'$  suspension q6, calcitriol 2ug q12 between meals, and 1ug calcitriol qMWF with dialysis.  2. End-stage renal disease Received dialysis treatment Monday, Wednesday, and Friday with iron and erythropoietin-stimulating factor as needed.  3. Gastric ulcer Esophagogastroduodenoscopy performed 10-2021 revealed gastric ulcer and gastritis likely secondary to NSAID use. Managed throughout hospitalization with pantoprazole.  4. Hypertension Blood pressure was controlled throughout admission with amlodipine '10mg'$  q24, carvedilol '25mg'$  q12, and dialysis.  5. Depression Mood was stable throughout hospitalization and managed pharmacologically with sertraline '50mg'$  24.   Discharge Exam:   BP (!) 145/97 (BP Location: Right Arm)   Pulse 72   Temp 98.9 F (37.2 C) (Oral)   Resp 16   Wt 62.2 kg   LMP 06/02/2015   SpO2 94%   BMI 21.48 kg/m   Constitutional: well-appearing female,  sitting in bed, in no acute distress Cardiovascular: RRR, no murmurs, rubs or gallops Pulmonary/Chest: normal work of breathing on room air, lungs clear to auscultation bilaterally Abdominal: soft, non-tender, non-distended Skin: warm and dry Extremities: upper/lower extremity pulses 2+, no lower extremity edema present   Pertinent Labs, Studies, and Procedures:      Latest Ref Rng & Units 03/26/2022    8:15 AM 03/21/2022   11:27 PM 03/21/2022    2:49 AM  CBC  WBC 4.0 - 10.5 K/uL 8.8  7.6  8.2   Hemoglobin  12.0 - 15.0 g/dL 8.5  9.5  10.7   Hematocrit 36.0 - 46.0 % 26.6  28.8  33.5   Platelets 150 - 400 K/uL 219  201  163        Latest Ref Rng & Units 03/28/2022    4:12 PM 03/28/2022    2:10 PM 03/28/2022    9:03 AM  BMP  Calcium 8.9 - 10.3 mg/dL 6.0  6.2  5.9       Discharge Instructions: Discharge Instructions     Call MD for:  difficulty breathing, headache or visual disturbances   Complete by: As directed    Call MD for:  extreme fatigue   Complete by: As directed    Call MD for:  persistant dizziness or light-headedness   Complete by: As directed    Call MD for:  persistant nausea and vomiting   Complete by: As directed    Call MD for:  temperature >100.4   Complete by: As directed    Diet - low sodium heart healthy   Complete by:  As directed    Discharge instructions   Complete by: As directed    Ruth Gutierrez, I am so glad you are able to be discharged today! You were admitted because your calcium was very low, which can be dangerous for your heart. Thankfully, we were able to increase your calcium to a safe level. Please see the following notes:  NEW MEDICATIONS: First calcium medication: - Calcitriol 43mg twice daily (1 pill = 0.567m, so take four tablets twice daily) - Calcitriol 54m39mYou will take the 54mc74mill on Monday, Wednesdays, and Fridays with dialysis  Second calcium medication: On Saturday 8/26 and Sunday 8/27 you will need to take 12 Tums four times daily. Starting on Monday 8/29 you can start taking '2400mg'$  of the liquid form of calcium four times daily.  DO NOT TAKE: Sevelamer carbonate  - you no longer need this medication.   Please make sure to follow-up with your kidney doctors and continue on your current dialysis schedule.  You will be getting a call from the clinic on Monday to set up a hospital follow-up appointment.  It was a pleasure meeting you, Ms. Foster. I wish you the best and hope you stay happy and healthy!  Thank you, PhilSanjuan Dame   Increase activity slowly   Complete by: As directed    No wound care   Complete by: As directed        Signed:  Pager: [facility] PGY-1 Pager [phone]

## 2022-03-28 NOTE — Progress Notes (Addendum)
  Progress Note   Date: 03/25/2022  Patient Name: Ruth Gutierrez        MRN#: [id number]  Review the patient's clinical findings supports the diagnosis of:   Hyponatremia , not clinically significant

## 2022-03-28 NOTE — Progress Notes (Signed)
Received patient in bed to unit.  Alert and oriented.  Informed consent signed and in chart.   Treatment initiated: 1610 Treatment completed: 1909  Patient tolerated well.  Transported back to the room  Alert, without acute distress.  Hand-off given to patient's nurse.   Access used: fistula Access issues: none  Total UF removed: 2000 Medication(s) given: none Post HD VS: 100, 153/80(103), HR-78, RR-19, SP02-99 Post HD weight: 63.5kg   [facility]

## 2022-03-28 NOTE — Progress Notes (Signed)
Patient c/o of pain on a  pain scale of 10/10,prn given to right side of rib,pain med given, night medication given,patient IV  removed ,discharge instruction given  to patient,daughter transported patient home.

## 2022-03-28 NOTE — Progress Notes (Signed)
   03/28/22 1023  Mobility  Activity Ambulated with assistance in hallway  Level of Assistance Standby assist, set-up cues, supervision of patient - no hands on  Assistive Device Front wheel walker  Distance Ambulated (ft) 550 ft  Activity Response Tolerated well  $Mobility charge 1 Mobility

## 2022-03-31 ENCOUNTER — Telehealth: Payer: Self-pay

## 2022-03-31 ENCOUNTER — Telehealth: Payer: Self-pay | Admitting: Student

## 2022-03-31 DIAGNOSIS — D631 Anemia in chronic kidney disease: Secondary | ICD-10-CM | POA: Diagnosis not present

## 2022-03-31 DIAGNOSIS — N186 End stage renal disease: Secondary | ICD-10-CM | POA: Diagnosis not present

## 2022-03-31 DIAGNOSIS — N2581 Secondary hyperparathyroidism of renal origin: Secondary | ICD-10-CM | POA: Diagnosis not present

## 2022-03-31 DIAGNOSIS — Z992 Dependence on renal dialysis: Secondary | ICD-10-CM | POA: Diagnosis not present

## 2022-03-31 DIAGNOSIS — L299 Pruritus, unspecified: Secondary | ICD-10-CM | POA: Diagnosis not present

## 2022-03-31 DIAGNOSIS — K31811 Angiodysplasia of stomach and duodenum with bleeding: Secondary | ICD-10-CM | POA: Diagnosis not present

## 2022-03-31 DIAGNOSIS — R52 Pain, unspecified: Secondary | ICD-10-CM | POA: Diagnosis not present

## 2022-03-31 MED ORDER — CALCITRIOL 0.5 MCG PO CAPS
1.0000 ug | ORAL_CAPSULE | ORAL | 0 refills | Status: DC
Start: 2022-03-31 — End: 2022-04-05

## 2022-03-31 MED ORDER — CALCITRIOL 0.5 MCG PO CAPS: 2.0000 ug | ORAL_CAPSULE | Freq: Two times a day (BID) | ORAL | 0 refills | Status: AC

## 2022-03-31 NOTE — Telephone Encounter (Signed)
Please call back to the pt pharmacy as they has questions about th following medications   calcitRIOL (ROCALTROL) 0.5 MCG capsule calcitRIOL (ROCALTROL) 0.5 MCG capsule Calcium Carbonate Antacid (CALCIUM CARBONATE, DOSED IN MG ELEMENTAL CALCIUM,) 1250 MG/5ML SUSP   [place], [place] - [address]

## 2022-03-31 NOTE — Patient Outreach (Signed)
  Care Coordination TOC Note Transition Care Management Follow-up Telephone Call Date of discharge and from where: [facility] 03/21/22-03/28/22 How have you been since you were released from the hospital? "I am feeling okay, been resting a lot since I am very tired". Any questions or concerns? No  Items Reviewed: Did the pt receive and understand the discharge instructions provided? Yes  Medications obtained and verified? Yes  Other? No  Any new allergies since your discharge? No  Dietary orders reviewed? Yes Do you have support at home? No   Home Care and Equipment/Supplies: Were home health services ordered? no If so, what is the name of the agency? N/A  Has the agency set up a time to come to the patient's home? not applicable Were any new equipment or medical supplies ordered?  No What is the name of the medical supply agency? N/A Were you able to get the supplies/equipment? not applicable Do you have any questions related to the use of the equipment or supplies? No  Functional Questionnaire: (I = Independent and D = Dependent) ADLs: I  Bathing/Dressing- I  Meal Prep- I  Eating- I  Maintaining continence- I  Transferring/Ambulation- I  Managing Meds- I  Follow up appointments reviewed:  PCP Hospital f/u appt confirmed? No   Specialist Hospital f/u appt confirmed? No   Are transportation arrangements needed? No  If their condition worsens, is the pt aware to call PCP or go to the Emergency Dept.? Yes Was the patient provided with contact information for the PCP's office or ED? Yes Was to pt encouraged to call back with questions or concerns? Yes  SDOH assessments and interventions completed:   Yes  Care Coordination Interventions Activated:  Yes   Care Coordination Interventions:  PCP follow up appointment requested    Encounter Outcome:  Pt. Visit Completed

## 2022-03-31 NOTE — Telephone Encounter (Signed)
[facility] stated they only do compound medications. Calcitriol rx needs to be sent to her regular pharmacy. Thanks

## 2022-04-01 NOTE — Telephone Encounter (Signed)
Unable to reach Pt: Pt mailbox is full

## 2022-04-02 ENCOUNTER — Telehealth: Payer: Self-pay

## 2022-04-02 DIAGNOSIS — D631 Anemia in chronic kidney disease: Secondary | ICD-10-CM | POA: Diagnosis not present

## 2022-04-02 DIAGNOSIS — L299 Pruritus, unspecified: Secondary | ICD-10-CM | POA: Diagnosis not present

## 2022-04-02 DIAGNOSIS — K31811 Angiodysplasia of stomach and duodenum with bleeding: Secondary | ICD-10-CM | POA: Diagnosis not present

## 2022-04-02 DIAGNOSIS — N186 End stage renal disease: Secondary | ICD-10-CM | POA: Diagnosis not present

## 2022-04-02 DIAGNOSIS — R52 Pain, unspecified: Secondary | ICD-10-CM | POA: Diagnosis not present

## 2022-04-02 DIAGNOSIS — Z992 Dependence on renal dialysis: Secondary | ICD-10-CM | POA: Diagnosis not present

## 2022-04-02 DIAGNOSIS — N2581 Secondary hyperparathyroidism of renal origin: Secondary | ICD-10-CM | POA: Diagnosis not present

## 2022-04-02 NOTE — Telephone Encounter (Signed)
Pa for pt ( CALCITRIOL 0.5 MCG CAPS ) came through on cover my meds was submitted with last office notes and labs ... Awaiting approval or denial

## 2022-04-03 DIAGNOSIS — N186 End stage renal disease: Secondary | ICD-10-CM | POA: Diagnosis not present

## 2022-04-03 DIAGNOSIS — I129 Hypertensive chronic kidney disease with stage 1 through stage 4 chronic kidney disease, or unspecified chronic kidney disease: Secondary | ICD-10-CM | POA: Diagnosis not present

## 2022-04-03 DIAGNOSIS — Z992 Dependence on renal dialysis: Secondary | ICD-10-CM | POA: Diagnosis not present

## 2022-04-03 NOTE — Telephone Encounter (Signed)
Decision Notes:   CALCITRIOL CAP 0.5MCG is a benefit exclusion under Medicare Part D coverage. End-stage renal disease (ESRD) medications will not be approved for separate payment under Part B or Part D when their use is related or unrelated to ESRD and dialysis (for example: anti-infectives used to treat access site infection).   Renal dialysis services must be billed by the renal dialysis facility. Reviewed by: [id number], R.Ph.    ( COPY GIVEN TO  PROVIDER AND PLACE TO BE SCANNED TO CHART )

## 2022-04-04 ENCOUNTER — Emergency Department (HOSPITAL_COMMUNITY): Payer: Medicare Other

## 2022-04-04 ENCOUNTER — Other Ambulatory Visit: Payer: Self-pay

## 2022-04-04 ENCOUNTER — Telehealth: Payer: Self-pay | Admitting: Student

## 2022-04-04 ENCOUNTER — Inpatient Hospital Stay (HOSPITAL_COMMUNITY)
Admission: EM | Admit: 2022-04-04 | Discharge: 2022-05-04 | DRG: 100 | Disposition: E | Payer: Medicare Other | Attending: Internal Medicine | Admitting: Internal Medicine

## 2022-04-04 ENCOUNTER — Encounter (HOSPITAL_COMMUNITY): Payer: Self-pay | Admitting: Emergency Medicine

## 2022-04-04 DIAGNOSIS — J80 Acute respiratory distress syndrome: Secondary | ICD-10-CM | POA: Diagnosis not present

## 2022-04-04 DIAGNOSIS — I482 Chronic atrial fibrillation, unspecified: Secondary | ICD-10-CM | POA: Diagnosis present

## 2022-04-04 DIAGNOSIS — Z841 Family history of disorders of kidney and ureter: Secondary | ICD-10-CM

## 2022-04-04 DIAGNOSIS — J690 Pneumonitis due to inhalation of food and vomit: Secondary | ICD-10-CM | POA: Diagnosis not present

## 2022-04-04 DIAGNOSIS — F32A Depression, unspecified: Secondary | ICD-10-CM | POA: Diagnosis not present

## 2022-04-04 DIAGNOSIS — L309 Dermatitis, unspecified: Secondary | ICD-10-CM | POA: Diagnosis present

## 2022-04-04 DIAGNOSIS — G936 Cerebral edema: Secondary | ICD-10-CM | POA: Diagnosis present

## 2022-04-04 DIAGNOSIS — N186 End stage renal disease: Secondary | ICD-10-CM

## 2022-04-04 DIAGNOSIS — Z8249 Family history of ischemic heart disease and other diseases of the circulatory system: Secondary | ICD-10-CM

## 2022-04-04 DIAGNOSIS — Z880 Allergy status to penicillin: Secondary | ICD-10-CM

## 2022-04-04 DIAGNOSIS — Z79899 Other long term (current) drug therapy: Secondary | ICD-10-CM

## 2022-04-04 DIAGNOSIS — Z8701 Personal history of pneumonia (recurrent): Secondary | ICD-10-CM

## 2022-04-04 DIAGNOSIS — I120 Hypertensive chronic kidney disease with stage 5 chronic kidney disease or end stage renal disease: Secondary | ICD-10-CM | POA: Diagnosis not present

## 2022-04-04 DIAGNOSIS — D631 Anemia in chronic kidney disease: Secondary | ICD-10-CM | POA: Diagnosis present

## 2022-04-04 DIAGNOSIS — N2581 Secondary hyperparathyroidism of renal origin: Secondary | ICD-10-CM | POA: Diagnosis present

## 2022-04-04 DIAGNOSIS — Z83438 Family history of other disorder of lipoprotein metabolism and other lipidemia: Secondary | ICD-10-CM

## 2022-04-04 DIAGNOSIS — K259 Gastric ulcer, unspecified as acute or chronic, without hemorrhage or perforation: Secondary | ICD-10-CM | POA: Diagnosis not present

## 2022-04-04 DIAGNOSIS — E876 Hypokalemia: Secondary | ICD-10-CM | POA: Diagnosis not present

## 2022-04-04 DIAGNOSIS — Z66 Do not resuscitate: Secondary | ICD-10-CM | POA: Diagnosis not present

## 2022-04-04 DIAGNOSIS — R4182 Altered mental status, unspecified: Secondary | ICD-10-CM | POA: Diagnosis not present

## 2022-04-04 DIAGNOSIS — G931 Anoxic brain damage, not elsewhere classified: Secondary | ICD-10-CM | POA: Diagnosis not present

## 2022-04-04 DIAGNOSIS — E892 Postprocedural hypoparathyroidism: Secondary | ICD-10-CM | POA: Diagnosis present

## 2022-04-04 DIAGNOSIS — G40802 Other epilepsy, not intractable, without status epilepticus: Secondary | ICD-10-CM | POA: Diagnosis not present

## 2022-04-04 DIAGNOSIS — Z992 Dependence on renal dialysis: Secondary | ICD-10-CM | POA: Diagnosis not present

## 2022-04-04 DIAGNOSIS — E871 Hypo-osmolality and hyponatremia: Secondary | ICD-10-CM | POA: Diagnosis not present

## 2022-04-04 DIAGNOSIS — Z560 Unemployment, unspecified: Secondary | ICD-10-CM

## 2022-04-04 DIAGNOSIS — I517 Cardiomegaly: Secondary | ICD-10-CM | POA: Diagnosis not present

## 2022-04-04 DIAGNOSIS — Z515 Encounter for palliative care: Secondary | ICD-10-CM | POA: Diagnosis not present

## 2022-04-04 DIAGNOSIS — R54 Age-related physical debility: Secondary | ICD-10-CM | POA: Diagnosis not present

## 2022-04-04 DIAGNOSIS — Y830 Surgical operation with transplant of whole organ as the cause of abnormal reaction of the patient, or of later complication, without mention of misadventure at the time of the procedure: Secondary | ICD-10-CM | POA: Diagnosis present

## 2022-04-04 DIAGNOSIS — G9389 Other specified disorders of brain: Secondary | ICD-10-CM | POA: Diagnosis not present

## 2022-04-04 DIAGNOSIS — E162 Hypoglycemia, unspecified: Secondary | ICD-10-CM | POA: Diagnosis not present

## 2022-04-04 DIAGNOSIS — T452X6A Underdosing of vitamins, initial encounter: Secondary | ICD-10-CM | POA: Diagnosis present

## 2022-04-04 DIAGNOSIS — Z86011 Personal history of benign neoplasm of the brain: Secondary | ICD-10-CM

## 2022-04-04 DIAGNOSIS — Z9889 Other specified postprocedural states: Secondary | ICD-10-CM | POA: Diagnosis not present

## 2022-04-04 DIAGNOSIS — M1990 Unspecified osteoarthritis, unspecified site: Secondary | ICD-10-CM | POA: Diagnosis present

## 2022-04-04 DIAGNOSIS — G9341 Metabolic encephalopathy: Secondary | ICD-10-CM | POA: Diagnosis present

## 2022-04-04 DIAGNOSIS — T8612 Kidney transplant failure: Secondary | ICD-10-CM | POA: Diagnosis not present

## 2022-04-04 DIAGNOSIS — F17210 Nicotine dependence, cigarettes, uncomplicated: Secondary | ICD-10-CM | POA: Diagnosis present

## 2022-04-04 DIAGNOSIS — J90 Pleural effusion, not elsewhere classified: Secondary | ICD-10-CM | POA: Diagnosis not present

## 2022-04-04 DIAGNOSIS — Z91148 Patient's other noncompliance with medication regimen for other reason: Secondary | ICD-10-CM

## 2022-04-04 DIAGNOSIS — Z833 Family history of diabetes mellitus: Secondary | ICD-10-CM

## 2022-04-04 DIAGNOSIS — E874 Mixed disorder of acid-base balance: Secondary | ICD-10-CM | POA: Diagnosis not present

## 2022-04-04 DIAGNOSIS — G40901 Epilepsy, unspecified, not intractable, with status epilepticus: Secondary | ICD-10-CM | POA: Diagnosis not present

## 2022-04-04 DIAGNOSIS — Z9911 Dependence on respirator [ventilator] status: Secondary | ICD-10-CM | POA: Diagnosis not present

## 2022-04-04 DIAGNOSIS — K219 Gastro-esophageal reflux disease without esophagitis: Secondary | ICD-10-CM | POA: Diagnosis present

## 2022-04-04 DIAGNOSIS — I10 Essential (primary) hypertension: Secondary | ICD-10-CM | POA: Diagnosis present

## 2022-04-04 DIAGNOSIS — Z86018 Personal history of other benign neoplasm: Secondary | ICD-10-CM

## 2022-04-04 DIAGNOSIS — I468 Cardiac arrest due to other underlying condition: Secondary | ICD-10-CM | POA: Diagnosis not present

## 2022-04-04 DIAGNOSIS — Z5329 Procedure and treatment not carried out because of patient's decision for other reasons: Secondary | ICD-10-CM | POA: Diagnosis present

## 2022-04-04 DIAGNOSIS — R34 Anuria and oliguria: Secondary | ICD-10-CM | POA: Diagnosis not present

## 2022-04-04 DIAGNOSIS — Z9089 Acquired absence of other organs: Secondary | ICD-10-CM

## 2022-04-04 DIAGNOSIS — K31811 Angiodysplasia of stomach and duodenum with bleeding: Secondary | ICD-10-CM | POA: Diagnosis not present

## 2022-04-04 DIAGNOSIS — H518 Other specified disorders of binocular movement: Secondary | ICD-10-CM | POA: Diagnosis not present

## 2022-04-04 DIAGNOSIS — E875 Hyperkalemia: Secondary | ICD-10-CM | POA: Diagnosis not present

## 2022-04-04 DIAGNOSIS — E8770 Fluid overload, unspecified: Secondary | ICD-10-CM | POA: Diagnosis not present

## 2022-04-04 LAB — MAGNESIUM: Magnesium: 2.4 mg/dL (ref 1.7–2.4)

## 2022-04-04 LAB — I-STAT VENOUS BLOOD GAS, ED
Acid-Base Excess: 6 mmol/L — ABNORMAL HIGH (ref 0.0–2.0)
Bicarbonate: 28.7 mmol/L — ABNORMAL HIGH (ref 20.0–28.0)
Calcium, Ion: 0.62 mmol/L — CL (ref 1.15–1.40)
HCT: 26 % — ABNORMAL LOW (ref 36.0–46.0)
Hemoglobin: 8.8 g/dL — ABNORMAL LOW (ref 12.0–15.0)
O2 Saturation: 99 %
Patient temperature: 22
Potassium: 3.9 mmol/L (ref 3.5–5.1)
Sodium: 136 mmol/L (ref 135–145)
TCO2: 30 mmol/L (ref 22–32)
pCO2, Ven: 18.1 mmHg — CL (ref 44–60)
pH, Ven: 7.757 (ref 7.25–7.43)
pO2, Ven: 73 mmHg — ABNORMAL HIGH (ref 32–45)

## 2022-04-04 LAB — COMPREHENSIVE METABOLIC PANEL
ALT: 19 U/L (ref 0–44)
AST: 19 U/L (ref 15–41)
Albumin: 2.8 g/dL — ABNORMAL LOW (ref 3.5–5.0)
Alkaline Phosphatase: 648 U/L — ABNORMAL HIGH (ref 38–126)
Anion gap: 15 (ref 5–15)
BUN: 31 mg/dL — ABNORMAL HIGH (ref 6–20)
CO2: 26 mmol/L (ref 22–32)
Calcium: 5.5 mg/dL — CL (ref 8.9–10.3)
Chloride: 96 mmol/L — ABNORMAL LOW (ref 98–111)
Creatinine, Ser: 8.18 mg/dL — ABNORMAL HIGH (ref 0.44–1.00)
GFR, Estimated: 5 mL/min — ABNORMAL LOW (ref >60)
Glucose, Bld: 161 mg/dL — ABNORMAL HIGH (ref 70–99)
Potassium: 4.1 mmol/L (ref 3.5–5.1)
Sodium: 137 mmol/L (ref 135–145)
Total Bilirubin: 0.8 mg/dL (ref 0.3–1.2)
Total Protein: 7.3 g/dL (ref 6.5–8.1)

## 2022-04-04 LAB — CBC
HCT: 28.3 % — ABNORMAL LOW (ref 36.0–46.0)
Hemoglobin: 9.1 g/dL — ABNORMAL LOW (ref 12.0–15.0)
MCH: 28.7 pg (ref 26.0–34.0)
MCHC: 32.2 g/dL (ref 30.0–36.0)
MCV: 89.3 fL (ref 80.0–100.0)
Platelets: 217 10*3/uL (ref 150–400)
RBC: 3.17 MIL/uL — ABNORMAL LOW (ref 3.87–5.11)
RDW: 18 % — ABNORMAL HIGH (ref 11.5–15.5)
WBC: 10.5 10*3/uL (ref 4.0–10.5)
nRBC: 0 % (ref 0.0–0.2)

## 2022-04-04 LAB — I-STAT BETA HCG BLOOD, ED (MC, WL, AP ONLY): I-stat hCG, quantitative: <5 m[IU]/mL (ref <5)

## 2022-04-04 LAB — TROPONIN I (HIGH SENSITIVITY): Troponin I (High Sensitivity): 10 ng/L (ref <18)

## 2022-04-04 LAB — CBG MONITORING, ED: Glucose-Capillary: 195 mg/dL — ABNORMAL HIGH (ref 70–99)

## 2022-04-04 MED ORDER — CALCIUM GLUCONATE-NACL 1-0.675 GM/50ML-% IV SOLN
1.0000 g | Freq: Once | INTRAVENOUS | Status: AC
Start: 2022-04-04 — End: 2022-04-04
  Administered 2022-04-04: 1000 mg via INTRAVENOUS
  Filled 2022-04-04: qty 50

## 2022-04-04 MED ORDER — LORAZEPAM 2 MG/ML IJ SOLN
0.5000 mg | Freq: Once | INTRAMUSCULAR | Status: AC
  Administered 2022-04-05: 0.5 mg via INTRAVENOUS
  Filled 2022-04-04: qty 1

## 2022-04-04 NOTE — ED Provider Notes (Signed)
56 yo F with a chief complaints of altered mental status.  Going on for about 48 hours.  Was found to be relatively unresponsive at dialysis after getting about an hour of dialysis.  I assumed care from Dr. Pearline Cables, plan to obtain some additional blood work and reassess.  I had a discussion with the family who feels that she is completely different from her baseline.  I discussed this with the internal medicine teaching service to evaluate for admission.  CRITICAL CARE Performed by: Cecilio Asper   Total critical care time: 35 minutes  Critical care time was exclusive of separately billable procedures and treating other patients.  Critical care was necessary to treat or prevent imminent or life-threatening deterioration.  Critical care was time spent personally by me on the following activities: development of treatment plan with patient and/or surrogate as well as nursing, discussions with consultants, evaluation of patient's response to treatment, examination of patient, obtaining history from patient or surrogate, ordering and performing treatments and interventions, ordering and review of laboratory studies, ordering and review of radiographic studies, pulse oximetry and re-evaluation of patient's condition.    Deno Etienne, DO 04/26/2022 2355

## 2022-04-04 NOTE — H&P (Incomplete)
Date: 04/21/2022               Patient Name:  Ruth Gutierrez MRN: [id number]  DOB: 31-Jan-1966 Age / Sex: 56 y.o., female   PCP: Gaylan Gerold, DO         Medical Service: Internal Medicine Teaching Service         Attending Physician: Dr. Deno Etienne, DO    First Contact: {RESIDENTNAMES2023:19197::"Jonathan White, [place], DO","Emily Dean, [place], [place], MD","Katie Masters, Tito Dine, [place], [place], MD","Amar Posey Pronto, DO","Michael Markus Jarvis DO","Saad Nooruddin, MD","Areeg Spencer, DO","Prosper Coy Saunas, [place], MD","Vasili Katsadouros, DO","Jessica Lisabeth Devoid, MD","Quan Milas Kocher, MD","Tavien Mapp, MD","Tyler Stann Mainland, MD","Sri Jodell Cipro, MD"}      Pager: {RESIDENTNAMES2023:19197::"[phone]","RA [phone]","GG [phone]","TM [phone]","WS [phone]","[phone]","SA [phone]","[phone] [phone]","MM [phone]","[phone]","MZ [phone]","SN [phone]","TZ [phone]","PA [phone]","PB [phone]","VK [phone]","JL [phone]","[phone]","[phone]","TM [phone]","[phone]","SS [phone]"}      Second Contact: {RESIDENTNAMES2023:19197::"Jonathan White, MD","Rayann Tish Frederickson, DO","Greylon Elliot Gurney, MD","Ghalib Humphrey Rolls, MD","Katie Masters, Tito Dine, MD","Joseph Charlett Nose, MD","Amar Posey Pronto, DO","Michael Sophronia Simas Nooruddin, MD","Areeg Rehman, DO","Prosper Coy Saunas, MD","Phillip Collene Gobble, MD","Vasili Katsadouros, DO","Jessica Lisabeth Devoid, MD","Quan Parthenia Ames, MD","Tavien Mapp, MD","Tyler Stann Mainland, MD","Sri Jodell Cipro, MD"}      Pager: {RESIDENTNAMES2023:19197::"[phone]","RA [phone]","ED [phone]","GG [phone]","GK [phone]","KM [phone]","SA [phone]","JG [phone]","MM [phone]","[phone]","MZ [phone]","SN [phone]","AR [phone]","PA [phone]","PB [phone]","VK [phone]","JL [phone]","[phone]","[id number]","TM [phone]","[id number]","SS [phone]"}            After Hours (After 5p/  First Contact Pager: [phone]  weekends / holidays): Second Contact Pager: [phone]   SUBJECTIVE   Chief Complaint: Altered Mental Status  History of Present Illness: Patient is a 56 year old female with a past medical history of ESRD post transplant on dialysis, parathyroidectomy, hypertension, and depression who presents for altered mental status.  During dialysis yesterday 9-1, the patient became lethargic and confused. Consequently, hemodialysis was prematurely ended and xxx  The patient underwent total parathyroidectomy on 8-15 and was hospitalized for hypocalcemia on 8-17. During a   ED Course: Xxx   Meds:  No outpatient medications have been marked as taking for the 04/13/2022 encounter [facility] Encounter).    Past Medical History  Past Surgical History:  Procedure Laterality Date  . A/V FISTULAGRAM Left 02/12/2017   Procedure: A/V Fistulagram;  Surgeon: Algernon Huxley, MD;  Location: [place] [facility];  Service: Cardiovascular;  Laterality: Left;  . A/V FISTULAGRAM Left 12/30/2017   Procedure: A/V FISTULAGRAM;  Surgeon: Algernon Huxley, MD;  Location: [place] [facility];  Service: Cardiovascular;  Laterality: Left;  . A/V FISTULAGRAM Left 04/01/2021   Procedure: A/V FISTULAGRAM;  Surgeon: Algernon Huxley, MD;  Location: [place] [facility];  Service: Cardiovascular;  Laterality: Left;  . A/V SHUNT INTERVENTION N/A 02/12/2017   Procedure: A/V Shunt Intervention;  Surgeon: Algernon Huxley, MD;  Location: [place] [facility];  Service: Cardiovascular;  Laterality: N/A;  . AV FISTULA PLACEMENT    . BASCILIC VEIN TRANSPOSITION Left 10/30/2014   Procedure: LEFT BASCILIC VEIN TRANSPOSITION;  Surgeon: Rosetta Posner, MD;  Location: [place];  Service: Vascular;  Laterality: Left;  . BASCILIC VEIN TRANSPOSITION Left 01/03/2015   Procedure: LEFT ARM 2ND STAGE BASCILIC VEIN TRANSPOSITION;  Surgeon: Rosetta Posner, MD;  Location: [place];  Service: Vascular;   Laterality: Left;  . BIOPSY  10/23/2021   Procedure: BIOPSY;  Surgeon: Jackquline Denmark, MD;  Location: [facility];  Service: Gastroenterology;;  . BREAST BIOPSY Left    Patient doesn't remember any information from previous procedure.  Marland Kitchen CAPD REMOVAL N/A 10/31/2019   Procedure: PERITONEAL DIALYSIS  (CAPD) INFECTED CATHETER REMOVAL;  Surgeon: Coralie Keens, MD;  Location: [place];  Service: General;  Laterality: N/A;  . CRANIOTOMY N/A 11/01/2020   Procedure: CRANIOTOMY FOR TUMOR EXCISION;  Surgeon: [name], MD;  Location: [place];  Service: Neurosurgery;  Laterality: N/A;  . ESOPHAGOGASTRODUODENOSCOPY (EGD) WITH PROPOFOL N/A 10/23/2021   Procedure: ESOPHAGOGASTRODUODENOSCOPY (EGD) WITH PROPOFOL;  Surgeon: Jackquline Denmark, MD;  Location: [place];  Service: Gastroenterology;  Laterality: N/A;  . FRACTURE SURGERY     left foot,baby toe nad next toe missing  . INSERTION OF DIALYSIS CATHETER Right 10/30/2014   Procedure: INSERTION OF DIALYSIS CATHETER;  Surgeon: Rosetta Posner, MD;  Location: [place];  Service: Vascular;  Laterality: Right;  . KIDNEY TRANSPLANT  11/02/2008   [facility])  . PARATHYROIDECTOMY N/A 03/18/2022   Procedure: TOTAL PARATHYROIDECTOMY;  Surgeon: Armandina Gemma, MD;  Location: [place];  Service: General;  Laterality: N/A;  . PLACEMENT OF LUMBAR DRAIN N/A 11/01/2020   Procedure: PLACEMENT OF LUMBAR DRAIN;  Surgeon: [name], MD;  Location: [place];  Service: Neurosurgery;  Laterality: N/A;  . WISDOM TOOTH EXTRACTION      Social:  Lives With: Occupation: Support: Level of Function: PCP: Substances:  Family History: ***  Allergies: Allergies as of 04/28/2022 - Review Complete 04/24/2022  Allergen Reaction Noted  . Penicillins Itching and Rash 02/02/2013    Review of Systems: A complete ROS was negative except as per HPI.   OBJECTIVE:   Physical Exam: Blood pressure (!) 145/84, pulse 80, temperature 99.7 F (37.6 C), temperature source  Oral, resp. rate 18, last menstrual period 06/02/2015, SpO2 95 %.   General:   awake and alert, lying comfortably in bed, cooperative, not in acute distress Skin:   warm and dry, intact without any obvious lesions or scars, no rashes or lesions  Head:   normocephalic and atraumatic, oral mucosa moist with good dentition, no lymphadenopathy Eyes:   extraocular movements intact, conjunctivae pink, pupils round and reactive to light, no periorbital swelling or scleral icterus Ears:   pinnae normal, no discharge or external lesions  Nose:   symmetrical and without mucosal inflammation, no external lesions or discharge Lungs:   normal respiratory effort, breathing unlabored, symmetrical chest rise, no crackles or wheezing Cardiac:   regular rate and rhythm, normal S1 and S2, capillary refill 2-3 seconds, no pitting edema Abdomen:   soft and non-distended, normoactive bowel sounds present in all four quadrants, no guarding or palpable masses Musculoskeletal:   full range of motion in joints, motor strength 5 /5 in all four extremities, no obvious deformities or joint tenderness Neurologic:   oriented to person-place-time, moving all extremities, sensation to light touch and pinprick sensation intact, no facial droop Psychiatric:   mood and affect normal, intelligible speech   Labs: CBC    Component Value Date/Time   WBC 10.5 04/15/2022 2019   RBC 3.17 (L) 04/19/2022 2019   HGB 8.8 (L) 04/13/2022 2346   HGB 7.9 (L) 11/07/2021 1203   HCT 26.0 (L) 04/13/2022 2346   HCT 24.1 (L) 11/07/2021 1203   PLT 217 04/10/2022 2019   PLT 166 11/07/2021 1203   MCV 89.3 04/12/2022 2019   MCV 91 11/07/2021 1203   MCH 28.7 04/14/2022 2019   MCHC 32.2 04/27/2022 2019   RDW 18.0 (H) 04/19/2022 2019   RDW 17.2 (H) 11/07/2021 1203   LYMPHSABS 2.9 10/22/2021 0605   LYMPHSABS 1.6 09/19/2021 1153   MONOABS 0.5 10/22/2021 0605   EOSABS 0.2 10/22/2021 6144  EOSABS 0.2 09/19/2021 1153   BASOSABS 0.0 10/22/2021  0605   BASOSABS 0.0 09/19/2021 1153     CMP     Component Value Date/Time   NA 136 05/03/2022 2346   NA 142 06/11/2021 1153   K 3.9 05/01/2022 2346   CL 96 (L) 04/29/2022 2019   CO2 26 04/26/2022 2019   GLUCOSE 161 (H) 04/15/2022 2019   BUN 31 (H) 04/05/2022 2019   BUN 31 (H) 06/11/2021 1153   CREATININE 8.18 (H) 04/13/2022 2019   CREATININE 1.47 (H) 04/14/2013 1149   CALCIUM 5.5 (LL) 04/18/2022 2019   CALCIUM 7.2 (L) 01/13/2007 0830   PROT 7.3 04/06/2022 2019   ALBUMIN 2.8 (L) 04/24/2022 2019   AST 19 04/30/2022 2019   ALT 19 04/08/2022 2019   ALKPHOS 648 (H) 04/11/2022 2019   BILITOT 0.8 04/29/2022 2019   GFRNONAA 5 (L) 04/21/2022 2019   GFRNONAA 43 (L) 04/14/2013 1149   GFRAA 9 (L) 11/08/2019 0940   GFRAA 49 (L) 04/14/2013 1149    Imaging:   EKG: personally reviewed my interpretation is***. Prior EKG***  ASSESSMENT & PLAN:   Assessment & Plan by Problem: Active Problems:   * No active hospital problems. *   ILEY DEIGNAN is a 56 y.o. person living with a history of *** who presented with *** and admitted for *** on hospital day 0  #*** ***  #*** ***  #*** ***  Diet: {NAMES:3044014::"Normal","Heart Healthy","Carb-Modified","Renal","Carb/Renal","NPO","TPN","Tube Feeds"} VTE: {NAMES:3044014::"Heparin","Enoxaparin","SCDs","NOAC","None"} IVF: {NAMES:3044014::"None","NS","1/2 NS","LR","D5","D10"},{NAMES:3044014::"None","10cc/hr","25cc/hr","50cc/hr","75cc/hr","100cc/hr","110cc/hr","125cc/hr","Bolus"} Code: {NAMES:3044014::"Full","DNR","DNI","DNR/DNI","Comfort Care","Unknown"}  Prior to Admission Living Arrangement: {NAMES:3044014::"Home, living ***","SNF, ***","Homeless","***"} Anticipated Discharge Location: {NAMES:3044014::"Home","SNF","CIR","***"} Barriers to Discharge: ***  Dispo: Admit patient to {STATUS:3044014::"Observation with expected length of stay less than 2 midnights.","Inpatient with expected length of stay greater than 2  midnights."}  Signed: Serita Butcher, MD Internal Medicine Resident PGY-1  04/07/2022, 11:56 PM

## 2022-04-04 NOTE — Hospital Course (Addendum)
  Xxx  Daughter concerned about confusion today 9-1 during dialysis session, which was never completed due to patient feeling unwell. She reports fatigue and dizziness. Labs collected in the ED notable for Ca 5.5 and AlkPhos 648. x     1. Symptomatic hypocalcemia post parathyroidectomy on 8-15 Patient presented to the ED three days after undergoing a total parathyroidectomy with epigastric pain, nausea, and diarrhea. Calcium level was 5.3 at presentation, dropped to 4.9, and peaked at 7.7 before eventually stabilizing at 5.8-5.9. Nephrology managed her electrolytes and established a calcium supplementation regimen consisting of liquid calcium carbonate '2400mg'$  suspension q6, calcitriol 2ug q12 between meals, and 1ug calcitriol qMWF with dialysis.   2. End-stage renal disease Received dialysis treatment Monday, Wednesday, and Friday with iron and erythropoietin-stimulating factor as needed.   3. Gastric ulcer Esophagogastroduodenoscopy performed 10-2021 revealed gastric ulcer and gastritis likely secondary to NSAID use. Managed throughout hospitalization with pantoprazole.   4. Hypertension Blood pressure was controlled throughout admission with amlodipine '10mg'$  q24, carvedilol '25mg'$  q12, and dialysis.   5. Depression Mood was stable throughout hospitalization and managed pharmacologically with sertraline '50mg'$  24.  NEW MEDICATIONS: First calcium medication: - Calcitriol 46mg twice daily (1 pill = 0.535m, so take four tablets twice daily) - Calcitriol 60m79mYou will take the 60mc26mill on Monday, Wednesdays, and Fridays with dialysis   Second calcium medication: On Saturday 8/26 and Sunday 8/27 you will need to take 12 Tums four times daily. Starting on Monday 8/29 you can start taking '2400mg'$  of the liquid form of calcium four times daily.  __________________________________________________  Daughter reports twitching of the left side of her face this morning. Mother feels she is somewhat  improved.

## 2022-04-04 NOTE — ED Provider Notes (Addendum)
[facility] EMERGENCY DEPARTMENT Provider Note   CSN: [id number] Arrival date & time: 04/10/2022  2003     History  Chief Complaint  Patient presents with   Altered Mental Status    Ruth Gutierrez is a 56 y.o. female.  Patient is a 56 year old female with past medical history of end-stage renal disease on dialysis with left upper extremity fistula presenting from dialysis for altered mental status.  Patient receives dialysis Monday Wednesday Friday.  Has not missed any sessions.  Family was called from dialysis center after patient was receiving dialysis for approximately 1 hour for altered mental status.  The family states the patient has been lethargic and has been confused stating "she just isn't making a lot of sense when she speaks".  Denies any signs of infection including no fevers, chills, nausea, vomiting, diarrhea.  Patient does not make urine.  Recent falls or head trauma.  Recent parathyroidectomy on 03/18/2022.  The history is provided by the patient. No language interpreter was used.       Home Medications Prior to Admission medications   Medication Sig Start Date End Date Taking? Authorizing Provider  albuterol (VENTOLIN HFA) 108 (90 Base) MCG/ACT inhaler Inhale 3 puffs into the lungs daily as needed for wheezing or shortness of breath. 11/12/21 03/21/22  Gaylan Gerold, DO  amLODipine (NORVASC) 10 MG tablet Take 1 tablet (10 mg total) by mouth daily. 07/02/21   Lajean Manes, MD  calcitRIOL (ROCALTROL) 0.5 MCG capsule Take 4 capsules (2 mcg total) by mouth 2 (two) times daily between meals. 03/31/22   Sanjuan Dame, MD  calcitRIOL (ROCALTROL) 0.5 MCG capsule Take 2 capsules (1 mcg total) by mouth every Monday, Wednesday, and Friday. 03/31/22   Sanjuan Dame, MD  Calcium Carbonate Antacid (CALCIUM CARBONATE, DOSED IN MG ELEMENTAL CALCIUM,) 1250 MG/5ML SUSP Take 24 mLs (2,400 mg of elemental calcium total) by mouth 4 (four) times daily. 03/31/22    Sanjuan Dame, MD  carvedilol (COREG) 25 MG tablet Take 1 tablet (25 mg total) by mouth 2 (two) times daily. 11/07/21 11/07/22  Gaylan Gerold, DO  diclofenac Sodium (VOLTAREN) 1 % GEL Apply 2 g topically 2 (two) times daily as needed (Right shoulder pain). Patient taking differently: Apply 1 Application topically as needed (Right shoulder pain). 01/20/22   Gaylan Gerold, DO  diphenhydrAMINE (BENADRYL) 25 mg capsule Take 50 mg by mouth 3 (three) times daily as needed for allergies.    [provider]  lidocaine-prilocaine (EMLA) cream Apply 1 application. topically every Monday, Wednesday, and Friday with hemodialysis. 02/01/20   [provider]  pantoprazole (PROTONIX) 40 MG tablet Take 1 tablet (40 mg total) by mouth 2 (two) times daily before a meal. Patient not taking: Reported on 03/19/2022 10/24/21 02/13/22  Rosezetta Schlatter, MD  Polyethylene Glycol 400 (BLINK TEARS OP) Place 1 drop into both eyes daily as needed (tired eyes). Patient not taking: Reported on 03/19/2022    [provider]  sertraline (ZOLOFT) 50 MG tablet Take 1 tablet (50 mg total) by mouth daily. 07/02/21 07/02/22  Lajean Manes, MD      Allergies    Penicillins    Review of Systems   Review of Systems  Constitutional:  Negative for chills and fever.  HENT:  Negative for ear pain and sore throat.   Eyes:  Negative for pain and visual disturbance.  Respiratory:  Negative for cough and shortness of breath.   Cardiovascular:  Negative for chest pain and palpitations.  Gastrointestinal:  Negative for abdominal pain and vomiting.  Genitourinary:  Negative for dysuria and hematuria.  Musculoskeletal:  Negative for arthralgias and back pain.  Skin:  Negative for color change and rash.  Neurological:  Negative for seizures and syncope.  Psychiatric/Behavioral:  Positive for confusion.   All other systems reviewed and are negative.   Physical Exam Updated Vital Signs BP (!) 145/84   Pulse 80   Temp  99.7 F (37.6 C) (Oral)   Resp 18   LMP 06/02/2015   SpO2 95%  Physical Exam Vitals and nursing note reviewed.  Constitutional:      General: She is not in acute distress.    Appearance: She is well-developed.  HENT:     Head: Normocephalic and atraumatic.  Eyes:     Conjunctiva/sclera: Conjunctivae normal.  Cardiovascular:     Rate and Rhythm: Normal rate and regular rhythm.     Heart sounds: No murmur heard. Pulmonary:     Effort: Pulmonary effort is normal. No respiratory distress.     Breath sounds: Normal breath sounds.  Abdominal:     Palpations: Abdomen is soft.     Tenderness: There is no abdominal tenderness.  Musculoskeletal:        General: No swelling.     Cervical back: Neck supple.  Skin:    General: Skin is warm and dry.     Capillary Refill: Capillary refill takes less than 2 seconds.  Neurological:     Mental Status: She is alert and oriented to person, place, and time.     GCS: GCS eye subscore is 4. GCS verbal subscore is 5. GCS motor subscore is 6.     Cranial Nerves: Cranial nerves 2-12 are intact.     Sensory: Sensation is intact.     Motor: Motor function is intact.     Coordination: Coordination is intact.  Psychiatric:        Mood and Affect: Mood normal.     ED Results / Procedures / Treatments   Labs (all labs ordered are listed, but only abnormal results are displayed) Labs Reviewed  COMPREHENSIVE METABOLIC PANEL - Abnormal; Notable for the following components:      Result Value   Chloride 96 (*)    Glucose, Bld 161 (*)    BUN 31 (*)    Creatinine, Ser 8.18 (*)    Calcium 5.5 (*)    Albumin 2.8 (*)    Alkaline Phosphatase 648 (*)    GFR, Estimated 5 (*)    All other components within normal limits  CBC - Abnormal; Notable for the following components:   RBC 3.17 (*)    Hemoglobin 9.1 (*)    HCT 28.3 (*)    RDW 18.0 (*)    All other components within normal limits  CBG MONITORING, ED - Abnormal; Notable for the following  components:   Glucose-Capillary 195 (*)    All other components within normal limits  MAGNESIUM  TSH  I-STAT BETA HCG BLOOD, ED ([facility], [facility], [facility] ONLY)  TROPONIN I (HIGH SENSITIVITY)  TROPONIN I (HIGH SENSITIVITY)    EKG EKG Interpretation  Date/Time:  Friday April 04 2022 20:25:18 EDT Ventricular Rate:  93 PR Interval:  140 QRS Duration: 94 QT Interval:  436 QTC Calculation: 542 R Axis:   26 Text Interpretation: Normal sinus rhythm Possible Left atrial enlargement ST & T wave abnormality, consider lateral ischemia Prolonged QT Abnormal ECG No significant change since last tracing Confirmed by Deno Etienne [phone])  on 04/12/2022 11:07:21 PM  Radiology CT Head Wo Contrast  Result Date: 05/01/2022 CLINICAL DATA:  Altered mental status EXAM: CT HEAD WITHOUT CONTRAST TECHNIQUE: Contiguous axial images were obtained from the base of the skull through the vertex without intravenous contrast. RADIATION DOSE REDUCTION: This exam was performed according to the departmental dose-optimization program which includes automated exposure control, adjustment of the mA and/or kV according to patient size and/or use of iterative reconstruction technique. 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 Bifrontal encephalomalacia noted. No evidence of acute intracranial hemorrhage or infarct. No abnormal mass effect or midline shift. No abnormal intra or extra-axial mass lesion or fluid collection. Ventricular size is normal. Cerebellum is unremarkable. Vascular: No hyperdense vessel or unexpected calcification. Skull: Defects within the anterior osseous cribriform plate bilaterally, seen overlying the roof of the frontal sinuses. There is soft tissue within the frontal sinuses bilaterally and this may represent postsurgical change and/or fat packing though a CSF leak could appear similarly. No acute fracture.  Sinuses/Orbits: As noted above, there is soft tissue as well as macroscopic fat within the frontal sinuses. Remaining paranasal sinuses are clear. Orbits are unremarkable. Other: Mastoid air cells and middle ear cavities are clear. IMPRESSION: 1. 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 Soft tissue within the frontal sinuses bilaterally may represent postsurgical change and/or fat packing though a CSF leak could appear similarly. Correlation with clinical examination is recommended. 2. 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 3. No evidence of acute intracranial hemorrhage or infarct. Electronically Signed   By: Fidela Salisbury M.D.   On: 04/20/2022 22:50    Procedures Procedures    Medications Ordered in ED Medications  calcium gluconate 1 g/ 50 mL sodium chloride IVPB (0 mg Intravenous Stopped 04/05/2022 2309)    ED Course/ Medical Decision Making/ A&P                           Medical Decision Making Amount and/or Complexity of Data Reviewed Labs: ordered. Radiology: ordered.  Risk Prescription drug management.   25:42 PM  56 year old female with past medical history of end-stage renal disease on dialysis with left upper extremity fistula presenting from dialysis for altered mental status.  Patient is alert and oriented x3, no acute distress, afebrile, stable vital signs.  No neurovascular deficits.  Patient sleepy on presentation but is easily arousable.    Altered mental status  -no hypoxia.   -No anemia.   -Thyroid function pending for recent parathyroidectomy. -CT scan stable. -No signs or symptoms of sepsis. Pt does not make urine -No overt signs of uremia -VBG pending to r/o hypercarbia -hypocalcemia on exam. Calcium replaced.   Patient signed out to oncoming provider Dr. Tyrone Nine while awaiting thyroid function        Final Clinical Impression(s) / ED Diagnoses Final  diagnoses:  End stage renal disease on dialysis [facility])  Altered mental status, unspecified altered mental status type  Hypocalcemia    Rx / DC Orders ED Discharge Orders     None         Lianne Cure, DO 08/65/78 [id number]    Lianne Cure, DO 29/52/84 [id number]

## 2022-04-04 NOTE — Telephone Encounter (Signed)
Unable to reach Pt: Pt mailbox is full

## 2022-04-04 NOTE — ED Notes (Signed)
Per pt's visitor, she went to dialysis today but did not complete session due to not feeling well. Pt denies pain, states she feels tired and dizzy. AOx3

## 2022-04-04 NOTE — ED Notes (Signed)
Patient 's family reported confusion / altered mental status for several days , she did not receive hemodialysis treatment today .

## 2022-04-04 NOTE — ED Provider Triage Note (Signed)
Emergency Medicine Provider Triage Evaluation Note  Ruth Gutierrez , a 56 y.o. female  was evaluated in triage.  Pt complains of AMS.Had dialysis today but did not complete treatment she is unable to provide further history. According to records Ca was 5.2 at HD today, she also had a recent parathyroidectomy and might have left AMA. Unable to obtain further history due to mental status.   Review of Systems  Positive:  Negative:   Physical Exam  BP (!) 144/76 (BP Location: Right Arm)   Pulse 91   Temp 99.7 F (37.6 C) (Oral)   Resp 18   LMP 06/02/2015   SpO2 94%  Gen:   Awake, no distress   Resp:  Normal effort  MSK:   Moves extremities without difficulty  Other:    Medical Decision Making  Medically screening exam initiated at 8:11 PM.  Appropriate orders placed.  Aili Casillas Foster-Obua was informed that the remainder of the evaluation will be completed by another provider, this initial triage assessment does not replace that evaluation, and the importance of remaining in the ED until their evaluation is complete.  Slow to response, call charge to get a room ASAP.  8:13 PM    Janeece Fitting, PA-C 04/14/2022 2013

## 2022-04-04 NOTE — Telephone Encounter (Signed)
Daughter called after hours number. Concerned patient has been acting confused after HD today. Was also told Ca low at 5.2 at HD today. Patient with recent parathyroidectomy with recent admission for hypocalcemia. Discussed daughter should have patient evaluated in the ED for her confusion. Daughter is agreeable.

## 2022-04-04 NOTE — H&P (Addendum)
Date: 04/05/2022               Patient Name:  Ruth Gutierrez MRN: [id number]  DOB: [birth year] Age / Sex: 56 y.o., female   PCP: Gaylan Gerold, DO         Medical Service: Internal Medicine Teaching Service         Attending Physician: Dr. Velna Ochs, MD    First Contact: Ruth Skeans, MD      Pager: [phone]      Second Contact: Ruth Beard, MD      Pager: Governor Rooks [phone]           After Hours (After 5p/  First Contact Pager: [phone]  weekends / holidays): Second Contact Pager: [phone]   SUBJECTIVE   Chief Complaint: Altered Mental Status  History of Present Illness: Patient is a 56 year old female with a past medical history of ESRD post transplant on dialysis, parathyroidectomy, hypertension, and depression who presents for altered mental status. During dialysis yesterday 9-1, the patient became lethargic and confused. Consequently, her hemodialysis session ended prematurely and she was brought to [facility] for further evaluation.  Patient was extremely somnolent during initial encounter with the treatment team. She responded to loud verbal stimuli by opening her eyes, but would then promptly fall back to sleep. Prior to her MRI, she received lorazepam, which is almost certainly contributing to her current state of somnolence. Some history was obtained from her mother, who was present. She states that the patient had been unable to sleep well throughout the past two nights, which is not unusual for her. She also states that the patient was in her typical state of health until yesterday 9-1 when she received a call from the dialysis center notifying her that the patient was dizzy, lethargic, and confused. Her mother wonders whether this presentation is somehow related to her craniotomy performed in 2022 by Dr Duffy Rhody. Per mother, patient lives with daughter and is typically independent in all ADLs and IADLs.  The patient underwent total parathyroidectomy on  8-15 and was hospitalized for hypocalcemia on 8-17 after presenting for epigastric pain, nausea, and diarrhea. During that hospitalization, her calcium level fluctuated between 4.9 and 7.7 before eventually stabiliizing at 5.8-5.9. Nephrology managed her electrolytes throughout admission and discharged her with an oral calcium regimen consisting of liquid calcium carbonate '2400mg'$  suspension q6, calcitriol 2ug q12 between meals, and 1ug calcitriol qMWF with dialysis. Given the patient's somnolence, the treatment team was unable to confirm adherence to this regimen since discharge.   ED Course: Upon arrival, the patient was sleepy on presentation but easily arousable. Vital signs were unremarkable, notable only for blood pressure 145/84. Laboratory testing revealed Ca 5.5, pH 7.76, and pCO2 18.1. Electrocardiogram demonstrated prolonged QT intervals, which is consistent with hypocalcemia. Non-contrast head CT was negative for acute hemorrhage and MRI brain had been ordered by the time of admission.    Meds:  No outpatient medications have been marked as taking for the 04/15/2022 encounter [facility] Encounter).    Past Medical History  Past Surgical History:  Procedure Laterality Date   A/V FISTULAGRAM Left 02/12/2017   Procedure: A/V Fistulagram;  Surgeon: Algernon Huxley, MD;  Location: [place] [facility];  Service: Cardiovascular;  Laterality: Left;   A/V FISTULAGRAM Left 12/30/2017   Procedure: A/V FISTULAGRAM;  Surgeon: Algernon Huxley, MD;  Location: [place] [facility];  Service: Cardiovascular;  Laterality: Left;   A/V FISTULAGRAM Left 04/01/2021   Procedure: A/V FISTULAGRAM;  Surgeon: Algernon Huxley, MD;  Location: [place] [facility];  Service: Cardiovascular;  Laterality: Left;   A/V SHUNT INTERVENTION N/A 02/12/2017   Procedure: A/V Shunt Intervention;  Surgeon: Algernon Huxley, MD;  Location: [place] [facility];  Service: Cardiovascular;  Laterality: N/A;   AV FISTULA PLACEMENT     BASCILIC  VEIN TRANSPOSITION Left 10/30/2014   Procedure: LEFT [place];  Surgeon: Rosetta Posner, MD;  Location: [place];  Service: Vascular;  Laterality: Left;   [place] Left 01/03/2015   Procedure: LEFT ARM 2ND STAGE [place];  Surgeon: Rosetta Posner, MD;  Location: [place];  Service: Vascular;  Laterality: Left;   BIOPSY  10/23/2021   Procedure: BIOPSY;  Surgeon: Jackquline Denmark, MD;  Location: [facility] ENDOSCOPY;  Service: Gastroenterology;;   BREAST BIOPSY Left    Patient doesn't remember any information from previous procedure.   CAPD REMOVAL N/A 10/31/2019   Procedure: PERITONEAL DIALYSIS  (CAPD) INFECTED CATHETER REMOVAL;  Surgeon: Coralie Keens, MD;  Location: [place];  Service: General;  Laterality: N/A;   CRANIOTOMY N/A 11/01/2020   Procedure: CRANIOTOMY FOR TUMOR EXCISION;  Surgeon: [name], MD;  Location: [place];  Service: Neurosurgery;  Laterality: N/A;   ESOPHAGOGASTRODUODENOSCOPY (EGD) WITH PROPOFOL N/A 10/23/2021   Procedure: ESOPHAGOGASTRODUODENOSCOPY (EGD) WITH PROPOFOL;  Surgeon: Jackquline Denmark, MD;  Location: [place];  Service: Gastroenterology;  Laterality: N/A;   FRACTURE SURGERY     left foot,baby toe nad next toe missing   INSERTION OF DIALYSIS CATHETER Right 10/30/2014   Procedure: INSERTION OF DIALYSIS CATHETER;  Surgeon: Rosetta Posner, MD;  Location: [place];  Service: Vascular;  Laterality: Right;   KIDNEY TRANSPLANT  11/02/2008   [facility])   PARATHYROIDECTOMY N/A 03/18/2022   Procedure: TOTAL PARATHYROIDECTOMY;  Surgeon: Armandina Gemma, MD;  Location: [place];  Service: General;  Laterality: N/A;   PLACEMENT OF LUMBAR DRAIN N/A 11/01/2020   Procedure: PLACEMENT OF LUMBAR DRAIN;  Surgeon: [name], MD;  Location: [place];  Service: Neurosurgery;  Laterality: N/A;   WISDOM TOOTH EXTRACTION      Social:  Lives with daughter Occupation: none Support: family, mother lives nearby Level of Function: fully  independent in all ADLs and IADLs PCP: Dr Alfonse Spruce   Family History:  Family History  Problem Relation Age of Onset   Hypertension Mother    Hypertension Father    Hypertension Sister    Hyperlipidemia Sister    Hypertension Sister    Diabetes Brother    Deep vein thrombosis Brother    Kidney disease Brother        on HD   Colon cancer Neg Hx    Esophageal cancer Neg Hx    Rectal cancer Neg Hx    Breast cancer Neg Hx      Allergies: Allergies as of 04/09/2022 - Review Complete 04/10/2022  Allergen Reaction Noted   Penicillins Itching and Rash 02/02/2013    Review of Systems: A complete ROS was negative except as per HPI.   OBJECTIVE:   Physical Exam: Blood pressure (!) 145/84, pulse 80, temperature 99.7 F (37.6 C), temperature source Oral, resp. rate 18, last menstrual period 06/02/2015, SpO2 95 %.   General:   somnolent, lying in bed, not in acute distress Skin:   warm and dry without any rashes or lesions Head:   normocephalic and atraumatic Eyes:   pupils pinpoint and sluggishly reactive to light, left-gaze preference Lungs:  normal respiratory effort, breathing unlabored, symmetrical chest rise, no crackles or wheezing Cardiac:   regular rate and rhythm, normal S1 and S2, holosystolic murmur on left sternal border, no pitting edema Abdomen:   soft and non-distended, normoactive bowel sounds present in all four quadrants, no guarding or palpable masses Neurologic:   somnolent and only briefly responsive to loud verbal stimuli, intermittent twitching of right hand, withdraws to painful stimlui   Labs: CBC    Component Value Date/Time   WBC 10.5 04/11/2022 2019   RBC 3.17 (L) 04/24/2022 2019   HGB 8.8 (L) 04/19/2022 2346   HGB 7.9 (L) 11/07/2021 1203   HCT 26.0 (L) 04/21/2022 2346   HCT 24.1 (L) 11/07/2021 1203   PLT 217 04/18/2022 2019   PLT 166 11/07/2021 1203   MCV 89.3 05/02/2022 2019   MCV 91 11/07/2021 1203   MCH 28.7 04/12/2022 2019   MCHC 32.2  04/19/2022 2019   RDW 18.0 (H) 04/15/2022 2019   RDW 17.2 (H) 11/07/2021 1203   LYMPHSABS 2.9 10/22/2021 0605   LYMPHSABS 1.6 09/19/2021 1153   MONOABS 0.5 10/22/2021 0605   EOSABS 0.2 10/22/2021 0605   EOSABS 0.2 09/19/2021 1153   BASOSABS 0.0 10/22/2021 0605   BASOSABS 0.0 09/19/2021 1153     CMP     Component Value Date/Time   NA 136 04/13/2022 2346   NA 142 06/11/2021 1153   K 3.9 04/07/2022 2346   CL 96 (L) 04/18/2022 2019   CO2 26 04/25/2022 2019   GLUCOSE 161 (H) 04/11/2022 2019   BUN 31 (H) 04/30/2022 2019   BUN 31 (H) 06/11/2021 1153   CREATININE 8.18 (H) 05/02/2022 2019   CREATININE 1.47 (H) 04/14/2013 1149   CALCIUM 5.5 (LL) 04/24/2022 2019   CALCIUM 7.2 (L) 01/13/2007 0830   PROT 7.3 04/19/2022 2019   ALBUMIN 2.8 (L) 05/03/2022 2019   AST 19 04/17/2022 2019   ALT 19 04/07/2022 2019   ALKPHOS 648 (H) 04/10/2022 2019   BILITOT 0.8 04/19/2022 2019   GFRNONAA 5 (L) 04/26/2022 2019   GFRNONAA 43 (L) 04/14/2013 1149   GFRAA 9 (L) 11/08/2019 0940   GFRAA 49 (L) 04/14/2013 1149    Imaging: MR BRAIN WO CONTRAST  Result Date: 04/05/2022 CLINICAL DATA:  Initial evaluation for delirium. EXAM: MRI HEAD WITHOUT CONTRAST TECHNIQUE: Multiplanar, multiecho pulse sequences of the brain and surrounding structures were obtained without intravenous contrast. COMPARISON:  Prior CT from 04/29/2022 and MRI from 11/04/2020. FINDINGS: Brain: Examination degraded by motion artifact. Postoperative changes from previous bifrontal craniotomy for meningioma resection are seen. Underlying encephalomalacia and gliosis seen throughout the subjacent anterior/inferior frontal lobes bilaterally. Extension to involve the gyrus recti. No visible residual tumor or mass on this noncontrast motion degraded exam. Gyriform restricted diffusion is seen involving the anterior right frontal lobe, immediately adjacent to the chronic postsurgical changes (series 5, image 80). Diffusion signal abnormality spans  arterial vascular territories, with changes confined to the cortical gray matter. Overall appearance is favored to reflect changes of acute seizure/status epilepticus. No significant regional mass effect. No other evidence for acute or subacute ischemia or changes related to seizure. Gray-white matter differentiation otherwise maintained. No other acute or chronic intracranial blood products. No other mass lesion, mass effect, or midline shift. Mild ex vacuo dilatation of the lateral ventricles related to the chronic bifrontal encephalomalacia. No hydrocephalus. No extra-axial fluid collection. Pituitary gland and suprasellar region within normal limits. Vascular: Major intracranial vascular flow voids are maintained. Skull and upper  cervical spine: Craniocervical junction within normal limits. Markedly decreased T1 signal intensity seen throughout the visualized bone marrow, likely related history of end-stage renal disease. No focal marrow replacing lesion. Postoperative changes from prior bifrontal craniotomy noted. Scalp soft tissues demonstrate no acute finding. Sinuses/Orbits: Left gaze noted. Globes orbital soft tissues demonstrate no other acute finding. Scattered mucosal thickening noted about the ethmoidal air cells. Small bilateral mastoid effusions noted. Visualized nasopharynx unremarkable. Other: None. IMPRESSION: 1. Motion degraded exam. 2. Postoperative changes from previous bifrontal craniotomy for meningioma resection. Underlying encephalomalacia and gliosis throughout the subjacent anterior/inferior frontal lobes bilaterally. No visible locally recurrent tumor or mass on this noncontrast motion degraded exam. 3. Gyriform restricted diffusion involving the anterior right frontal lobe, immediately adjacent to the chronic postsurgical changes, favored to reflect changes of acute seizure/status epilepticus. Correlation with EEG recommended. 4. Otherwise normal brain MRI for age. Electronically Signed    By: Jeannine Boga M.D.   On: 04/05/2022 02:09   Portable chest 1 View  Result Date: 04/05/2022 CLINICAL DATA:  Confusion, altered mental status. EXAM: PORTABLE CHEST 1 VIEW COMPARISON:  03/21/2022. FINDINGS: Heart is enlarged and the mediastinal contour is stable. Scattered airspace opacities are noted at the lung bases. There is a small left pleural effusion. No pneumothorax. A vascular stent is present in the left axilla. Surgical clips are present over the superior mediastinum. No acute osseous abnormality. IMPRESSION: 1. Mild airspace atelectasis or infiltrate at the lung bases. 2. Small left pleural effusion. 3. Cardiomegaly. Electronically Signed   By: Brett Fairy M.D.   On: 04/05/2022 01:26   CT Head Wo Contrast  Result Date: 04/15/2022 CLINICAL DATA:  Altered mental status EXAM: CT HEAD WITHOUT CONTRAST TECHNIQUE: Contiguous axial images were obtained from the base of the skull through the vertex without intravenous contrast. RADIATION DOSE REDUCTION: This exam was performed according to the departmental dose-optimization program which includes automated exposure control, adjustment of the mA and/or kV according to patient size and/or use of iterative reconstruction technique. 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 Bifrontal encephalomalacia noted. No evidence of acute intracranial hemorrhage or infarct. No abnormal mass effect or midline shift. No abnormal intra or extra-axial mass lesion or fluid collection. Ventricular size is normal. Cerebellum is unremarkable. Vascular: No hyperdense vessel or unexpected calcification. Skull: Defects within the anterior osseous cribriform plate bilaterally, seen overlying the roof of the frontal sinuses. There is soft tissue within the frontal sinuses bilaterally and this may represent postsurgical change and/or fat packing though a CSF  leak could appear similarly. No acute fracture. Sinuses/Orbits: As noted above, there is soft tissue as well as macroscopic fat within the frontal sinuses. Remaining paranasal sinuses are clear. Orbits are unremarkable. Other: Mastoid air cells and middle ear cavities are clear. IMPRESSION: 1. 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 Soft tissue within the frontal sinuses bilaterally may represent postsurgical change and/or fat packing though a CSF leak could appear similarly. Correlation with clinical examination is recommended. 2. 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 3. No evidence of acute intracranial hemorrhage or infarct. Electronically Signed   By: Fidela Salisbury M.D.   On: 04/15/2022 22:50      EKG: personally reviewed my interpretation is sinus rhythm with prolonged QT interval. Prior EKG from 10-2020 without QT prolongation.   ASSESSMENT & PLAN:   Assessment & Plan by Problem:  Principal Problem:   Acute metabolic encephalopathy Active Problems:   Hypertension   ESRD on hemodialysis ([place])   Status post resection of meningioma   Hypocalcemia   H/O parathyroidectomy (HCC)   Ruth Gutierrez is a 56 y.o. person living with a history of ESRD post transplant on dialysis, parathyroidectomy, hypertension, and depression who presents for altered mental status and admitted for status epilepticus on hospital day 0   #Altered mental status #Status epilepticus Patient became lethargic and confused during her Friday hemodialysis session yesterday 9-1, which was subsequently discontinued, and she was brought urgently to the [facility] ED. Upon arrival, she was sleepy but arousable. Laboratory testing revealed hypocalcemia, evidence of primary respiratory alkalosis, and TSH <0.01. VBG obtained in the ED to rule out hypercarbia was indicative of respiratory alkalosis with pH 7.75 and pCO2 18.1, but  subsequent ABG showed pH 7.46, pCO2 40, and HCO2 28.4. Non-contrast head CT was negative for acute pathology. Electroencephalogram, which was ordered to rule out seizure, captured status epilepticus. MRI, performed two hours prior, demonstrated gyriform restricted diffusion, consistent with status epilepticus. Neurology obtained reading and promptly administered levetiracetam 3g. Lorazepam was avoided given that patient was already somnolent. Instead, valproate was administered. If she remains in status epilepticus, then lacosamide or fosphenytoin load may become necessary. Further escalation of seizure management would include intubation with midazolam or propofol. > Continue maintenance levetiracetam at 1g daily with an additional '500mg'$  on HD days > Implement seizure precautions, continuous EEG, and NPO diet > Provide supplemental O2 to maintain oxygen >92% > Repeat neurological checks every two hours > Order free T3 and T4 given low TSH > Request evaluation by PT and OT   #Symptomatic hypocalcemia #Parathyroidectomy 03-2022 Patient was hospitalized in 03-2022 after presenting for epigastric pain, diarrhea, and nausea. Her serum calcium was found to be decreased, gradually repleted, and she was discharged on an intensive oral supplementation regimen with stable calcium levels in the 5.8-5.9 range. At home, she takes calcitriol 0.5ug qMWF, calcitriol 2.0ug q12, and calcium carbonate '2400mg'$  q6. Upon arrival to the ED yesterday 9-1, laboratory testing revealed Ca 5.5. Seven hours after arrival, the patient began experiencing facial spasms. x > Administer intravenous calcium gluconate 2g bolus > Continue home calcitriol 2ug q12 and calcium carbonate '1000mg'$  q6 > Appreciate nephrology assistance   #ESRD on HD MWF Patient currently receives hemodialysis on MWF schedule. She was unable to complete her most recent session on Friday 9-1 due to altered mental status. Nephrology consulted. > Appreciate  nephrology assistance with setting up HD while inpatient   #Hypertension Managed at home with amlodipine '10mg'$  q24 and carvedilol '25mg'$  q12. > Hold hypertension medications due to NPO status   #Meningioma status post resection 2022 Non-contrast head CT performed on 9-1 demonstrated interval resection of cribriform plate mass with surgical changes of bifrontal craniotomy and bifrontal encephalomalacia consistent with remote bifrontal infarct. Evidence of status epilepticus captured on 9-2 were immediately adjacent to chronic postsurgical changes. The correlation, or lack thereof, between the prior meningioma resection and current status epilepticus remains unclear. > Recommend follow-up with neurosurgery in outpatient setting     Diet: NPO VTE: Heparin IVF: None,None Code: Full  Prior to Admission Living Arrangement: Home, living with daughter Anticipated Discharge Location:  TBD Barriers to Discharge: altered mental status  Dispo: Admit patient to Observation with expected length of stay less than 2 midnights.  Signed: Serita Butcher, MD Internal Medicine Resident PGY-1  04/05/2022, 1:47 AM

## 2022-04-05 ENCOUNTER — Observation Stay (HOSPITAL_COMMUNITY): Payer: Medicare Other

## 2022-04-05 DIAGNOSIS — J90 Pleural effusion, not elsewhere classified: Secondary | ICD-10-CM | POA: Diagnosis not present

## 2022-04-05 DIAGNOSIS — G40802 Other epilepsy, not intractable, without status epilepticus: Secondary | ICD-10-CM | POA: Diagnosis not present

## 2022-04-05 DIAGNOSIS — R1110 Vomiting, unspecified: Secondary | ICD-10-CM | POA: Diagnosis not present

## 2022-04-05 DIAGNOSIS — I120 Hypertensive chronic kidney disease with stage 5 chronic kidney disease or end stage renal disease: Secondary | ICD-10-CM | POA: Diagnosis not present

## 2022-04-05 DIAGNOSIS — R34 Anuria and oliguria: Secondary | ICD-10-CM | POA: Diagnosis not present

## 2022-04-05 DIAGNOSIS — Z9911 Dependence on respirator [ventilator] status: Secondary | ICD-10-CM | POA: Diagnosis not present

## 2022-04-05 DIAGNOSIS — J690 Pneumonitis due to inhalation of food and vomit: Secondary | ICD-10-CM | POA: Diagnosis not present

## 2022-04-05 DIAGNOSIS — G936 Cerebral edema: Secondary | ICD-10-CM | POA: Diagnosis not present

## 2022-04-05 DIAGNOSIS — I468 Cardiac arrest due to other underlying condition: Secondary | ICD-10-CM | POA: Diagnosis not present

## 2022-04-05 DIAGNOSIS — G9341 Metabolic encephalopathy: Secondary | ICD-10-CM | POA: Diagnosis present

## 2022-04-05 DIAGNOSIS — Z515 Encounter for palliative care: Secondary | ICD-10-CM | POA: Diagnosis not present

## 2022-04-05 DIAGNOSIS — I482 Chronic atrial fibrillation, unspecified: Secondary | ICD-10-CM | POA: Diagnosis present

## 2022-04-05 DIAGNOSIS — E892 Postprocedural hypoparathyroidism: Secondary | ICD-10-CM | POA: Diagnosis not present

## 2022-04-05 DIAGNOSIS — I469 Cardiac arrest, cause unspecified: Secondary | ICD-10-CM | POA: Diagnosis not present

## 2022-04-05 DIAGNOSIS — N2581 Secondary hyperparathyroidism of renal origin: Secondary | ICD-10-CM | POA: Diagnosis present

## 2022-04-05 DIAGNOSIS — N186 End stage renal disease: Secondary | ICD-10-CM | POA: Diagnosis not present

## 2022-04-05 DIAGNOSIS — Y830 Surgical operation with transplant of whole organ as the cause of abnormal reaction of the patient, or of later complication, without mention of misadventure at the time of the procedure: Secondary | ICD-10-CM | POA: Diagnosis present

## 2022-04-05 DIAGNOSIS — Z86018 Personal history of other benign neoplasm: Secondary | ICD-10-CM | POA: Diagnosis not present

## 2022-04-05 DIAGNOSIS — E871 Hypo-osmolality and hyponatremia: Secondary | ICD-10-CM | POA: Diagnosis not present

## 2022-04-05 DIAGNOSIS — Z4682 Encounter for fitting and adjustment of non-vascular catheter: Secondary | ICD-10-CM | POA: Diagnosis not present

## 2022-04-05 DIAGNOSIS — T8612 Kidney transplant failure: Secondary | ICD-10-CM | POA: Diagnosis present

## 2022-04-05 DIAGNOSIS — Z9889 Other specified postprocedural states: Secondary | ICD-10-CM | POA: Diagnosis not present

## 2022-04-05 DIAGNOSIS — Z7189 Other specified counseling: Secondary | ICD-10-CM | POA: Diagnosis not present

## 2022-04-05 DIAGNOSIS — G9389 Other specified disorders of brain: Secondary | ICD-10-CM | POA: Diagnosis not present

## 2022-04-05 DIAGNOSIS — E874 Mixed disorder of acid-base balance: Secondary | ICD-10-CM | POA: Diagnosis not present

## 2022-04-05 DIAGNOSIS — J80 Acute respiratory distress syndrome: Secondary | ICD-10-CM | POA: Diagnosis not present

## 2022-04-05 DIAGNOSIS — G40901 Epilepsy, unspecified, not intractable, with status epilepticus: Secondary | ICD-10-CM | POA: Diagnosis present

## 2022-04-05 DIAGNOSIS — J811 Chronic pulmonary edema: Secondary | ICD-10-CM | POA: Diagnosis not present

## 2022-04-05 DIAGNOSIS — R569 Unspecified convulsions: Secondary | ICD-10-CM | POA: Diagnosis not present

## 2022-04-05 DIAGNOSIS — Z992 Dependence on renal dialysis: Secondary | ICD-10-CM | POA: Diagnosis not present

## 2022-04-05 DIAGNOSIS — K259 Gastric ulcer, unspecified as acute or chronic, without hemorrhage or perforation: Secondary | ICD-10-CM | POA: Diagnosis present

## 2022-04-05 DIAGNOSIS — R4182 Altered mental status, unspecified: Secondary | ICD-10-CM

## 2022-04-05 DIAGNOSIS — J3489 Other specified disorders of nose and nasal sinuses: Secondary | ICD-10-CM | POA: Diagnosis not present

## 2022-04-05 DIAGNOSIS — F32A Depression, unspecified: Secondary | ICD-10-CM | POA: Diagnosis present

## 2022-04-05 DIAGNOSIS — Z66 Do not resuscitate: Secondary | ICD-10-CM | POA: Diagnosis not present

## 2022-04-05 DIAGNOSIS — D631 Anemia in chronic kidney disease: Secondary | ICD-10-CM | POA: Diagnosis not present

## 2022-04-05 DIAGNOSIS — R112 Nausea with vomiting, unspecified: Secondary | ICD-10-CM | POA: Diagnosis not present

## 2022-04-05 DIAGNOSIS — G931 Anoxic brain damage, not elsewhere classified: Secondary | ICD-10-CM | POA: Diagnosis not present

## 2022-04-05 DIAGNOSIS — R410 Disorientation, unspecified: Secondary | ICD-10-CM | POA: Diagnosis not present

## 2022-04-05 LAB — HEPATITIS B CORE ANTIBODY, TOTAL: Hep B Core Total Ab: NONREACTIVE

## 2022-04-05 LAB — COMPREHENSIVE METABOLIC PANEL
ALT: 17 U/L (ref 0–44)
AST: 16 U/L (ref 15–41)
Albumin: 2.7 g/dL — ABNORMAL LOW (ref 3.5–5.0)
Alkaline Phosphatase: 613 U/L — ABNORMAL HIGH (ref 38–126)
Anion gap: 13 (ref 5–15)
BUN: 33 mg/dL — ABNORMAL HIGH (ref 6–20)
CO2: 27 mmol/L (ref 22–32)
Calcium: 5.7 mg/dL — CL (ref 8.9–10.3)
Chloride: 98 mmol/L (ref 98–111)
Creatinine, Ser: 8.96 mg/dL — ABNORMAL HIGH (ref 0.44–1.00)
GFR, Estimated: 5 mL/min — ABNORMAL LOW (ref >60)
Glucose, Bld: 111 mg/dL — ABNORMAL HIGH (ref 70–99)
Potassium: 4 mmol/L (ref 3.5–5.1)
Sodium: 138 mmol/L (ref 135–145)
Total Bilirubin: 0.7 mg/dL (ref 0.3–1.2)
Total Protein: 6.5 g/dL (ref 6.5–8.1)

## 2022-04-05 LAB — PROTIME-INR
INR: 1.5 — ABNORMAL HIGH (ref 0.8–1.2)
Prothrombin Time: 18.3 seconds — ABNORMAL HIGH (ref 11.4–15.2)

## 2022-04-05 LAB — CALCIUM
Calcium: 5.1 mg/dL — CL (ref 8.9–10.3)
Calcium: 5.4 mg/dL — CL (ref 8.9–10.3)

## 2022-04-05 LAB — BLOOD GAS, ARTERIAL
Acid-Base Excess: 4.2 mmol/L — ABNORMAL HIGH (ref 0.0–2.0)
Bicarbonate: 28.4 mmol/L — ABNORMAL HIGH (ref 20.0–28.0)
Drawn by: 24486
O2 Saturation: 98.2 %
Patient temperature: 37
pCO2 arterial: 40 mmHg (ref 32–48)
pH, Arterial: 7.46 — ABNORMAL HIGH (ref 7.35–7.45)
pO2, Arterial: 76 mmHg — ABNORMAL LOW (ref 83–108)

## 2022-04-05 LAB — CBC
HCT: 25.7 % — ABNORMAL LOW (ref 36.0–46.0)
Hemoglobin: 8.5 g/dL — ABNORMAL LOW (ref 12.0–15.0)
MCH: 29.2 pg (ref 26.0–34.0)
MCHC: 33.1 g/dL (ref 30.0–36.0)
MCV: 88.3 fL (ref 80.0–100.0)
Platelets: 180 10*3/uL (ref 150–400)
RBC: 2.91 MIL/uL — ABNORMAL LOW (ref 3.87–5.11)
RDW: 17.8 % — ABNORMAL HIGH (ref 11.5–15.5)
WBC: 9.6 10*3/uL (ref 4.0–10.5)
nRBC: 0 % (ref 0.0–0.2)

## 2022-04-05 LAB — HEPATITIS B SURFACE ANTIGEN: Hepatitis B Surface Ag: NONREACTIVE

## 2022-04-05 LAB — VALPROIC ACID LEVEL: Valproic Acid Lvl: 46 ug/mL — ABNORMAL LOW (ref 50.0–100.0)

## 2022-04-05 LAB — APTT: aPTT: 40 seconds — ABNORMAL HIGH (ref 24–36)

## 2022-04-05 LAB — TROPONIN I (HIGH SENSITIVITY): Troponin I (High Sensitivity): 11 ng/L (ref <18)

## 2022-04-05 LAB — HEPATITIS B SURFACE ANTIBODY,QUALITATIVE: Hep B S Ab: REACTIVE — AB

## 2022-04-05 LAB — TSH: TSH: <0.01 u[IU]/mL — ABNORMAL LOW (ref 0.350–4.500)

## 2022-04-05 LAB — VITAMIN D 25 HYDROXY (VIT D DEFICIENCY, FRACTURES): Vit D, 25-Hydroxy: 18.31 ng/mL — ABNORMAL LOW (ref 30–100)

## 2022-04-05 LAB — T4, FREE: Free T4: 1.37 ng/dL — ABNORMAL HIGH (ref 0.61–1.12)

## 2022-04-05 LAB — HEPATITIS C ANTIBODY: HCV Ab: NONREACTIVE

## 2022-04-05 LAB — SALICYLATE LEVEL: Salicylate Lvl: <7 mg/dL — ABNORMAL LOW (ref 7.0–30.0)

## 2022-04-05 MED ORDER — CALCITRIOL 0.25 MCG PO CAPS: 1.0000 ug | ORAL_CAPSULE | Freq: Two times a day (BID) | ORAL | Status: DC

## 2022-04-05 MED ORDER — CALCITRIOL 0.25 MCG PO CAPS
2.0000 ug | ORAL_CAPSULE | Freq: Two times a day (BID) | ORAL | Status: DC
Start: 2022-04-05 — End: 2022-04-05

## 2022-04-05 MED ORDER — VALPROATE SODIUM 100 MG/ML IV SOLN
1000.0000 mg | INTRAVENOUS | Status: AC
  Administered 2022-04-05: 1000 mg via INTRAVENOUS
  Filled 2022-04-05: qty 10

## 2022-04-05 MED ORDER — SODIUM CHLORIDE 0.9 % IV SOLN
3000.0000 mg | Freq: Once | INTRAVENOUS | Status: AC
  Administered 2022-04-05: 3000 mg via INTRAVENOUS
  Filled 2022-04-05: qty 30

## 2022-04-05 MED ORDER — LEVETIRACETAM IN NACL 1000 MG/100ML IV SOLN
1000.0000 mg | Freq: Every day | INTRAVENOUS | Status: DC
  Filled 2022-04-05 (×2): qty 100

## 2022-04-05 MED ORDER — LEVETIRACETAM 500 MG PO TABS
500.0000 mg | ORAL_TABLET | ORAL | Status: DC
  Administered 2022-04-07: 500 mg via ORAL
  Filled 2022-04-05: qty 1

## 2022-04-05 MED ORDER — ALBUTEROL SULFATE (2.5 MG/3ML) 0.083% IN NEBU
3.0000 mL | INHALATION_SOLUTION | Freq: Every day | RESPIRATORY_TRACT | Status: DC | PRN
Start: 2022-04-05 — End: 2022-04-16
  Administered 2022-04-07 – 2022-04-12 (×5): 3 mL via RESPIRATORY_TRACT
  Filled 2022-04-05 (×5): qty 3

## 2022-04-05 MED ORDER — VITAMIN D 25 MCG (1000 UNIT) PO TABS: 1000.0000 [IU] | ORAL_TABLET | Freq: Every day | ORAL | Status: DC

## 2022-04-05 MED ORDER — CALCIUM GLUCONATE-NACL 2-0.675 GM/100ML-% IV SOLN
2.0000 g | Freq: Once | INTRAVENOUS | Status: AC
  Administered 2022-04-05: 2000 mg via INTRAVENOUS
  Filled 2022-04-05 (×2): qty 100

## 2022-04-05 MED ORDER — VALPROATE SODIUM 100 MG/ML IV SOLN
500.0000 mg | Freq: Three times a day (TID) | INTRAVENOUS | Status: DC
  Administered 2022-04-05 – 2022-04-06 (×3): 500 mg via INTRAVENOUS
  Filled 2022-04-05 (×5): qty 5

## 2022-04-05 MED ORDER — LEVETIRACETAM IN NACL 500 MG/100ML IV SOLN: 500.0000 mg | INTRAVENOUS | Status: DC

## 2022-04-05 MED ORDER — LEVETIRACETAM IN NACL 1000 MG/100ML IV SOLN
1000.0000 mg | Freq: Every day | INTRAVENOUS | Status: DC
  Administered 2022-04-06: 1000 mg via INTRAVENOUS
  Filled 2022-04-05 (×3): qty 100

## 2022-04-05 MED ORDER — LORAZEPAM 2 MG/ML IJ SOLN
1.0000 mg | Freq: Once | INTRAMUSCULAR | Status: AC
  Administered 2022-04-05: 1 mg via INTRAVENOUS
  Filled 2022-04-05: qty 1

## 2022-04-05 MED ORDER — CALCIUM CARBONATE ANTACID 1250 MG/5ML PO SUSP
1000.0000 mg | Freq: Four times a day (QID) | ORAL | Status: DC
Start: 2022-04-05 — End: 2022-04-05
  Filled 2022-04-05 (×4): qty 10

## 2022-04-05 MED ORDER — CHLORHEXIDINE GLUCONATE CLOTH 2 % EX PADS
6.0000 | MEDICATED_PAD | Freq: Every day | CUTANEOUS | Status: DC
  Administered 2022-04-05 – 2022-04-07 (×3): 6 via TOPICAL

## 2022-04-05 MED ORDER — SODIUM CHLORIDE 0.9 % IV SOLN
300.0000 mg | Freq: Once | INTRAVENOUS | Status: AC
  Administered 2022-04-05: 300 mg via INTRAVENOUS
  Filled 2022-04-05: qty 30

## 2022-04-05 MED ORDER — DICLOFENAC SODIUM 1 % EX GEL
2.0000 g | Freq: Two times a day (BID) | CUTANEOUS | Status: DC | PRN
Start: 2022-04-05 — End: 2022-04-16

## 2022-04-05 MED ORDER — LORAZEPAM 2 MG/ML IJ SOLN
2.0000 mg | Freq: Once | INTRAMUSCULAR | Status: AC
  Administered 2022-04-05: 2 mg via INTRAVENOUS
  Filled 2022-04-05: qty 1

## 2022-04-05 MED ORDER — LEVETIRACETAM IN NACL 500 MG/100ML IV SOLN
500.0000 mg | Freq: Once | INTRAVENOUS | Status: AC
  Administered 2022-04-05: 500 mg via INTRAVENOUS
  Filled 2022-04-05: qty 100

## 2022-04-05 MED ORDER — CALCIUM GLUCONATE-NACL 2-0.675 GM/100ML-% IV SOLN
2.0000 g | Freq: Once | INTRAVENOUS | Status: AC
  Administered 2022-04-05: 2000 mg via INTRAVENOUS
  Filled 2022-04-05: qty 100

## 2022-04-05 MED ORDER — VALPROATE SODIUM 100 MG/ML IV SOLN
1500.0000 mg | Freq: Once | INTRAVENOUS | Status: AC
  Administered 2022-04-05: 1500 mg via INTRAVENOUS
  Filled 2022-04-05: qty 15

## 2022-04-05 MED ORDER — LEVETIRACETAM 500 MG PO TABS
1000.0000 mg | ORAL_TABLET | Freq: Every day | ORAL | Status: DC
  Administered 2022-04-07: 1000 mg via ORAL
  Filled 2022-04-05 (×3): qty 2

## 2022-04-05 MED ORDER — LEVETIRACETAM 500 MG PO TABS: 1000.0000 mg | ORAL_TABLET | Freq: Every day | ORAL | Status: DC

## 2022-04-05 MED ORDER — HEPARIN SODIUM (PORCINE) 5000 UNIT/ML IJ SOLN
5000.0000 [IU] | Freq: Three times a day (TID) | INTRAMUSCULAR | Status: DC
  Administered 2022-04-05 – 2022-04-08 (×9): 5000 [IU] via SUBCUTANEOUS
  Filled 2022-04-05 (×11): qty 1

## 2022-04-05 MED ORDER — CALCIUM GLUCONATE-NACL 1-0.675 GM/50ML-% IV SOLN
1.0000 g | Freq: Once | INTRAVENOUS | Status: AC
  Administered 2022-04-05: 1000 mg via INTRAVENOUS
  Filled 2022-04-05: qty 50

## 2022-04-05 NOTE — Procedures (Signed)
Patient Name: Ruth Gutierrez  MRN: [id number]  Epilepsy Attending: Lora Havens  Referring Physician/Provider: [name], MD Date: 04/05/2022 Duration: 24.27 mins  Patient history: 56 y.o. female with PMH significant forESRD on HD, headaches, history of meningioma with bifrontal severe vasogenic edema s/p resection in April 2022, HTN who became confused and lethargic during HD and was sent to the ED. EEG to evaluate for seizure  Level of alertness:  lethargic   AEDs during EEG study: None  Technical aspects: This EEG study was done with scalp electrodes positioned according to the 10-20 International system of electrode placement. Electrical activity was reviewed with band pass filter of 1-'70Hz'$ , sensitivity of 7 uV/mm, display speed of 31m/sec with a '60Hz'$  notched filter applied as appropriate. EEG data were recorded continuously and digitally stored.  Video monitoring was available and reviewed as appropriate.  Description: EEG showed sharply contoured 12-'15hz'$  beta activity admixed with 5-'6hz'$  theta slowing in right frontal region with evolution in morphology and frequency and at times involves all of right hemisphere. No clinical signs were seen during this time. This eeg pattern is consistent with electrographic status epilepticus.  There is also continuous generalized 3 to 6 Hz theta-delta slowing.  Hyperventilation and photic stimulation were not performed.     ABNORMALITY - Electrographic status epilepticus, right frontal region - Continuous slow, generalized   IMPRESSION: This study showed evidence of electrographic status epilepticusarising from right frontal region. Additionally there is severe diffuse encephalopathy, nonspecific etiology but likely related to seizure.  Dr. KLorrin Goodellwas notified.at [place]    Aleatha Taite OBarbra Sarks

## 2022-04-05 NOTE — Progress Notes (Signed)
Pt is drowsy and unable to perform nurse bedside swallow eval at this time.

## 2022-04-05 NOTE — Progress Notes (Signed)
Subjective:   Patient is somnolent but arousable to verbal stimuli.  Mother states that she appears improved since yesterday but that her current condition is not her baseline. Mother notes twitching of her right arm and face overnight. Mother also states that she has not been wanting to open her eyes.  Discussed plan to follow-up with nephrology and neurology recommendations today.  Objective:  Vital signs in last 24 hours: Vitals:   04/26/2022 2245 04/05/22 0100 04/05/22 0136 04/05/22 0500  BP: (!) 145/84 (!) 154/84 (!) 159/94 (!) 143/78  Pulse: 80 92 86 83  Resp: 18 (!) '22 19 18  '$ Temp:  99.1 F (37.3 C) 99.2 F (37.3 C) 98.1 F (36.7 C)  TempSrc:  Axillary Axillary Axillary  SpO2: 95% 100% 92% 97%   Physical Exam: General: somnolent, lying in bed, not in acute distress HENT:   normocephalic and atraumatic, pupils pinpoint and sluggishly reactive to light, left-gaze preference Pulmonary:  normal respiratory effort, no wheezes, rales, or rhonchi Cardiac:   regular rate and rhythm, holosystolic murmur on left sternal border, no rubs or gallops, no LE edema Abdomen:  soft and non-distended, normoactive bowel sounds  Skin:   warm and dry  Neurologic:  somnolent and only briefly responding verbally to verbal stimuli, intermittent twitching of right hand  Assessment/Plan:  Principal Problem:   Acute metabolic encephalopathy Active Problems:   Hypertension   ESRD on hemodialysis (HCC)   Status post resection of meningioma   Hypocalcemia   H/O parathyroidectomy (HCC)  Ruth Gutierrez is a 56 y/o female with history of ESRD s/p renal transplant on HD MWF, HTN, s/p parathyroidectomy, and depression that presented w/ AMS and was admitted for status epilepticus.    # Altered mental status # Status epilepticus HD session on 9/1 ended prematurely due to patients lethargy and confusion. Somnolent but arousable on arrival. Calcium 6 on admission. ABG demonstrated pH 7.46, pCO2 40,  and HCO2 28.4. CT head negative. EEG demonstrated status epilepticus. MRI demonstrated gyriform restricted diffusion consistent with status epilepticus. Patient given Levetiracetam 3g and valproate per neuro. Neuro notes marked improvement following ativan. Neuro not recommending intubation at this time. Neuro recommends repeat 1X dose of ativan, 1X dose of Lacosamide, and optimization of keppra/depakote dosages. Valproate 1000 mg given this morning. PT recommends SNF w/ deferment of equipment recommendations until gait assessment. - Continue maintenance levetiracetam at 1g daily with an additional '500mg'$  on HD days (MWF) - Continuous EEG - Seizure precautions - Neuro checks q2 - NPO for now - OT eval   2. # Symptomatic hypocalcemia # Parathyroidectomy 03/2022 Patient was hospitalized in 03/2022 for epigastric pain, nausea, and diarrhea. Hypocalcemia noted and replenished. Discharged on oral supplementation regimen w/ stable calcium levels between 5.8-5.9. Home regimen is calcitriol 0.5ug qMWF, calcitriol 2.0ug q12, and calcium carbonate '2400mg'$  q6. Ca 5.5 on arrival. Patient began experiencing facial spasms several hours after admission. Corrected calcium 6.7 this morning. Patient did not pass her bedside swallow study this morning and did not received scheduled '1000mg'$  calcium carbonate suspension, 1 mcg calcitriol, and 1000 units cholecalciferol.  - IV calcium gluconate 2g bolus today - Continue PO calcium carbonate 2400 mg (4X daily), PO calcitriol 2 mcg (2X daily between meals), and PO cholecalciferol 25 mcg 1000 units (1X daily) once patient tolerating PO   3. # ESRD on HD MWF HD on MWF. Most recent HD ended prematurely. Nephro consulted. Plan for HD today.  - Continue to f/u on nephro recs   4. #  Hypertension BP 152/80 this morning. Amlodipine '10mg'$  and carvedilol '25mg'$  q12 at home.  - Holding antihypertensives due to NPO status   5. # Meningioma status post resection 2022 Head CT on 9/1  demonstrated interval resection of cribriform plate mass with surgical changes of bifrontal craniotomy and bifrontal encephalomalacia consistent with remote bifrontal infarct. Evidence of status epilepticus captured on 9/2  immediately adjacent to chronic postsurgical changes. Likely related to patients presentation.  - Recommend f/u with neurosurgery as outpatient   6. # Hyperthyroidism TSH decreased and free T4 elevated.  - Thyrotropin receptor autoabs and thyroid stimulating Ig ordered.  - Free T3 pending   Diet: NPO VTE: Heparin IVF: None Code: Full  Prior to Admission Living Arrangement: home w/ daughter Anticipated Discharge Location: home w/ daughter Barriers to Discharge: continued managment Dispo: Anticipated discharge in approximately more than 2 day(s).   Starlyn Skeans, MD 04/05/2022, 6:17 AM Pager: [phone] After 5pm on weekdays and 1pm on weekends: On Call pager [phone]

## 2022-04-05 NOTE — Progress Notes (Signed)
RN paged IM teaching service for clarification of NPO status and Calcium oral supplements due at 1000. Dispatch returned call and stated residents are making rounds of pt and he will contact them to give me an update. Awaiting update.

## 2022-04-05 NOTE — ED Notes (Addendum)
Pt family notified RN of new onset facial spasming. Assessment shows predominately left sided facial spasming/repetitive contracting with head spasming to the left. Pt maintains equal bilateral strength and upper and lower extremities. Pt has vacant expression and very limited verbal responses that remain oriented after repeated questioning by RN. MD [facility] notified and assessed patient at bedside. Spasms/contractions occurring intermittently at this time. Ativan requested for MRI and current symptoms.

## 2022-04-05 NOTE — Consult Note (Signed)
Reason for Consult: ESRD Referring Physician:  Dr. 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 She was NOT taking any calcium at home. - She did not tolerate the liquid calcium last hospitalization and threw it up; mother just picked up the tablets day before admission so patient had not been taking calcium at home. She has been on a higher Ca bath at outpt center. - Calcitriol 2 mics twice daily between meals, calcium carbonate 2400 mg (elemental calcium )suspension 4 times daily and will also give Calcitriol 81m64mwith dialysis MWF. OK to challenge with liquid again but if she doesn't tolerate then will transition to tabs immediately. Certainly possible she didn't tolerate the liquid last admission bec she wasn't eating very much - Aggressive replacement with CaGluconate as you are already doing; will give another 2 gms today.  ESRD -HD now on schedule MWF - Plan on HD today for 3 hrs only with goal UF 1L on 3Ca bath if available. HTN/volume -BP variable has been under EDW at center but very close to EDW Seizures - may be related to the hypocalcemia.  Anemia -Hgb 8.5 ESA - just received MIrcera 225m181mn 8/30.   Secondary hyperparathyroidism -see as above hypocalcemia with that plan.  Phosphorus in goal History of meningioma status postresection 11/01/2020   HPI: Ruth LADOUCEURan 56 y69. female ESRD, h/o transplant, PTX, hypertension, depression presenting with altered mental status.  Patient only received 1hr34mi15m treatment on 9/1  because of lethargy and confusion [facility] and initially thought that Ativan  was contributing. According to the family the patient has been confused for 1-2 days; pt was noted again at dialysis to be dizzy, lethargic and confused. Patient recently had a parathyroidecomy on 8/15 and was treated for hypocalcemia subsequently. Her regimen was supposed to be  liquid CaCO3 '2400mg'$  suspension q6hr but according to the mother she did not tolerate this in the hospital last admission and she only picked up the tablets day before admission; she is on calcitriol 2ug q12 between meals and 1ug IV with dialysis. I also looked at the med bottles and mother had picked up Rocaltrol and Clonidine. We also contacted the pt's daughter who lives with the pt and went through her pill bottles. She was not on any calcium replacement. Ca noted to be 5.5 in the ED with a pCO2 of 18. Patient had facial twitching in the ED with subsequent MRI of the brain showed gyriform diffusion restriction of the anterior right frontal lob suggestive of acute seizure/status epilepticus. EEG confirmed that it was coming from the right frontal lobe.   ROS Pertinent items are noted in HPI.  Chemistry and CBC:   Recent Labs  Lab 05/01/2022 2019 05/02/2022 2346 04/05/22 0219  NA 137 136 138  K 4.1 3.9 4.0  CL 96*  --  98  CO2 26  --  27  GLUCOSE 161*  --  111*  BUN 31*  --  33*  CREATININE 8.18*  --  8.96*  CALCIUM 5.5*  --  5.7*   Recent Labs  Lab 05/01/2022 2019 05/01/2022 2346 04/05/22 0219  WBC 10.5  --  9.6  HGB 9.1* 8.8* 8.5*  HCT 28.3* 26.0* 25.7*  MCV 89.3  --  88.3  PLT 217  --  180   Liver Function Tests: Recent Labs  Lab 04/07/2022 2019 04/05/22 0219  AST 19 16  ALT 19 17  ALKPHOS 648* 613*  BILITOT 0.8 0.7  PROT 7.3 6.5  ALBUMIN 2.8* 2.7*   No results for input(s): "LIPASE", "AMYLASE" in the last 168 hours. No results for input(s): "AMMONIA" in the last 168 hours. Cardiac Enzymes: No results for input(s): "CKTOTAL", "CKMB", "CKMBINDEX", "TROPONINI" in the last 168 hours. Iron Studies: No  results for input(s): "IRON", "TIBC", "TRANSFERRIN", "FERRITIN" in the last 72 hours. PT/INR: '@LABRCNTIP'$ (inr:5)  Xrays/Other Studies: ) Results for orders placed or performed during the hospital encounter of 04/04/2022 (from the past 48 hour(s))  Comprehensive metabolic panel     Status: Abnormal   Collection Time: 04/29/2022  8:19 PM  Result Value Ref Range   Sodium 137 135 - 145 mmol/L   Potassium 4.1 3.5 - 5.1 mmol/L   Chloride 96 (L) 98 - 111 mmol/L   CO2 26 22 - 32 mmol/L   Glucose, Bld 161 (H) 70 - 99 mg/dL    Comment: Glucose reference range applies only to samples taken after fasting for at least 8 hours.   BUN 31 (H) 6 - 20 mg/dL   Creatinine, Ser 8.18 (H) 0.44 - 1.00 mg/dL   Calcium 5.5 (LL) 8.9 - 10.3 mg/dL    Comment: CRITICAL RESULT CALLED TO, READ BACK BY AND VERIFIED WITH M. COCHRANE RN, 2125, 04/28/2022, EADEDOKUN   Total Protein 7.3 6.5 - 8.1 g/dL   Albumin 2.8 (L) 3.5 - 5.0 g/dL   AST 19 15 - 41 U/L   ALT 19 0 - 44 U/L   Alkaline Phosphatase 648 (H) 38 - 126 U/L   Total Bilirubin 0.8 0.3 - 1.2 mg/dL   GFR, Estimated 5 (L) >60 mL/min    Comment: (NOTE) Calculated using the CKD-EPI Creatinine Equation (2021)    Anion gap 15 5 - 15    Comment: Performed at [facility] [address] [place], [place] [address]  CBC     Status: Abnormal   Collection Time: 04/17/2022  8:19 PM  Result Value Ref Range   WBC 10.5 4.0 - 10.5 K/uL   RBC 3.17 (L) 3.87 - 5.11 MIL/uL   Hemoglobin 9.1 (L) 12.0 - 15.0 g/dL   HCT 28.3 (L) 36.0 - 46.0 %   MCV 89.3 80.0 - 100.0 fL   MCH 28.7 26.0 - 34.0 pg   MCHC 32.2 30.0 - 36.0 g/dL   RDW 18.0 (H) 11.5 - 15.5 %   Platelets 217 150 - 400 K/uL   nRBC 0.0 0.0 - 0.2 %    Comment: Performed at [facility] [address], [place], [place] [address]  Magnesium     Status: None   Collection Time: 04/08/2022  8:19 PM  Result Value Ref Range   Magnesium 2.4 1.7 - 2.4 mg/dL    Comment: Performed at [facility] [address], [place],  [address]  Troponin I (High Sensitivity)     Status: None   Collection Time: 05/01/2022  8:19 PM  Result Value Ref Range   Troponin I (High Sensitivity) 10 <18 ng/L    Comment: (NOTE) Elevated high sensitivity troponin I (hsTnI) values and significant  changes across serial measurements may suggest ACS but many other  chronic  and acute conditions are known to elevate hsTnI results.  Refer to the "Links" section for chest pain algorithms and additional  guidance. Performed at [facility], [place] [address], [place], [place] [address]   CBG monitoring, ED     Status: Abnormal   Collection Time: 04/22/2022  8:20 PM  Result Value Ref Range   Glucose-Capillary 195 (H) 70 - 99 mg/dL    Comment: Glucose reference range applies only to samples taken after fasting for at least 8 hours.  I-Stat beta hCG blood, ED     Status: None   Collection Time: 04/20/2022  8:25 PM  Result Value Ref Range   I-stat hCG, quantitative <5.0 <5 mIU/mL   Comment 3            Comment:   GEST. AGE      CONC.  (mIU/mL)   <=1 WEEK        5 - 50     2 WEEKS       50 - 500     3 WEEKS       100 - 10,000     4 WEEKS     1,000 - 30,000        FEMALE AND NON-PREGNANT FEMALE:     LESS THAN 5 mIU/mL   Troponin I (High Sensitivity)     Status: None   Collection Time: 04/13/2022 11:19 PM  Result Value Ref Range   Troponin I (High Sensitivity) 11 <18 ng/L    Comment: (NOTE) Elevated high sensitivity troponin I (hsTnI) values and significant  changes across serial measurements may suggest ACS but many other  chronic and acute conditions are known to elevate hsTnI results.  Refer to the "Links" section for chest pain algorithms and additional  guidance. Performed at [facility], [place] [address], [place], [place] [address]   TSH     Status: Abnormal   Collection Time: 04/08/2022 11:19 PM  Result Value Ref Range   TSH <0.010 (L) 0.350 - 4.500 uIU/mL    Comment: Performed by a 3rd Generation assay  with a functional sensitivity of <=0.01 uIU/mL. Performed at [facility], [place] [address], [place], [place] [address]   I-Stat venous blood gas, [facility] ED only)     Status: Abnormal   Collection Time: 04/07/2022 11:46 PM  Result Value Ref Range   pH, Ven 7.757 (HH) 7.25 - 7.43   pCO2, Ven 18.1 (LL) 44 - 60 mmHg   pO2, Ven 73 (H) 32 - 45 mmHg   Bicarbonate 28.7 (H) 20.0 - 28.0 mmol/L   TCO2 30 22 - 32 mmol/L   O2 Saturation 99 %   Acid-Base Excess 6.0 (H) 0.0 - 2.0 mmol/L   Sodium 136 135 - 145 mmol/L   Potassium 3.9 3.5 - 5.1 mmol/L   Calcium, Ion 0.62 (LL) 1.15 - 1.40 mmol/L   HCT 26.0 (L) 36.0 - 46.0 %   Hemoglobin 8.8 (L) 12.0 - 15.0 g/dL   Patient temperature 22.0 C    Sample type VENOUS    Comment NOTIFIED PHYSICIAN   Blood gas, arterial     Status: Abnormal   Collection Time: 04/05/22  2:08 AM  Result Value Ref Range   pH, Arterial 7.46 (H) 7.35 - 7.45   pCO2 arterial 40 32 - 48 mmHg   pO2, Arterial 76 (L) 83 - 108 mmHg   Bicarbonate 28.4 (H) 20.0 - 28.0 mmol/L   Acid-Base Excess 4.2 (H)  0.0 - 2.0 mmol/L   O2 Saturation 98.2 %   Patient temperature 37.0    Collection site RIGHT RADIAL    Drawn by [id number]    Allens test (pass/fail) PASS PASS    Comment: Performed at [facility], [place] [address], [place], [place] [address]  Salicylate level     Status: Abnormal   Collection Time: 04/05/22  2:19 AM  Result Value Ref Range   Salicylate Lvl <5.9 (L) 7.0 - 30.0 mg/dL    Comment: Performed at [facility] [address], [place], [place] [address]  Protime-INR     Status: Abnormal   Collection Time: 04/05/22  2:19 AM  Result Value Ref Range   Prothrombin Time 18.3 (H) 11.4 - 15.2 seconds   INR 1.5 (H) 0.8 - 1.2    Comment: (NOTE) INR goal varies based on device and disease states. Performed at [facility], [place] [address] [place], [place] [address]   APTT     Status: Abnormal   Collection Time: 04/05/22  2:19 AM  Result Value Ref Range   aPTT 40  (H) 24 - 36 seconds    Comment:        IF BASELINE aPTT IS ELEVATED, SUGGEST PATIENT RISK ASSESSMENT BE USED TO DETERMINE APPROPRIATE ANTICOAGULANT THERAPY. Performed at [facility], [place] [address] [place], [place] [address]   CBC     Status: Abnormal   Collection Time: 04/05/22  2:19 AM  Result Value Ref Range   WBC 9.6 4.0 - 10.5 K/uL   RBC 2.91 (L) 3.87 - 5.11 MIL/uL   Hemoglobin 8.5 (L) 12.0 - 15.0 g/dL   HCT 25.7 (L) 36.0 - 46.0 %   MCV 88.3 80.0 - 100.0 fL   MCH 29.2 26.0 - 34.0 pg   MCHC 33.1 30.0 - 36.0 g/dL   RDW 17.8 (H) 11.5 - 15.5 %   Platelets 180 150 - 400 K/uL   nRBC 0.0 0.0 - 0.2 %    Comment: Performed at [facility], [place] [address] [place], [place] [address]  Comprehensive metabolic panel     Status: Abnormal   Collection Time: 04/05/22  2:19 AM  Result Value Ref Range   Sodium 138 135 - 145 mmol/L   Potassium 4.0 3.5 - 5.1 mmol/L   Chloride 98 98 - 111 mmol/L   CO2 27 22 - 32 mmol/L   Glucose, Bld 111 (H) 70 - 99 mg/dL    Comment: Glucose reference range applies only to samples taken after fasting for at least 8 hours.   BUN 33 (H) 6 - 20 mg/dL   Creatinine, Ser 8.96 (H) 0.44 - 1.00 mg/dL   Calcium 5.7 (LL) 8.9 - 10.3 mg/dL    Comment: CRITICAL RESULT CALLED TO, READ BACK BY AND VERIFIED WITH Rito Ehrlich RN, [phone] 04/05/22, EADEDOKUN   Total Protein 6.5 6.5 - 8.1 g/dL   Albumin 2.7 (L) 3.5 - 5.0 g/dL   AST 16 15 - 41 U/L   ALT 17 0 - 44 U/L   Alkaline Phosphatase 613 (H) 38 - 126 U/L   Total Bilirubin 0.7 0.3 - 1.2 mg/dL   GFR, Estimated 5 (L) >60 mL/min    Comment: (NOTE) Calculated using the CKD-EPI Creatinine Equation (2021)    Anion gap 13 5 - 15    Comment: Performed at [facility] [address], [place],  [address]  T4, free  Status: Abnormal   Collection Time: 04/05/22  2:19 AM  Result Value Ref Range   Free T4 1.37 (H) 0.61 - 1.12 ng/dL    Comment: (NOTE) Biotin ingestion may interfere with free T4 tests.  If the results are inconsistent with the TSH level, previous test results, or the clinical presentation, then consider biotin interference. If needed, order repeat testing after stopping biotin. Performed at [facility], [place] [address] [place], [place] [address]    MR BRAIN WO CONTRAST  Result Date: 04/05/2022 CLINICAL DATA:  Initial evaluation for delirium. EXAM: MRI HEAD WITHOUT CONTRAST TECHNIQUE: Multiplanar, multiecho pulse sequences of the brain and surrounding structures were obtained without intravenous contrast. COMPARISON:  Prior CT from 04/14/2022 and MRI from 11/04/2020. FINDINGS: Brain: Examination degraded by motion artifact. Postoperative changes from previous bifrontal craniotomy for meningioma resection are seen. Underlying encephalomalacia and gliosis seen throughout the subjacent anterior/inferior frontal lobes bilaterally. Extension to involve the gyrus recti. No visible residual tumor or mass on this noncontrast motion degraded exam. Gyriform restricted diffusion is seen involving the anterior right frontal lobe, immediately adjacent to the chronic postsurgical changes (series 5, image 80). Diffusion signal abnormality spans arterial vascular territories, with changes confined to the cortical gray matter. Overall appearance is favored to reflect changes of acute seizure/status epilepticus. No significant regional mass effect. No other evidence for acute or subacute ischemia or changes related to seizure. Gray-white matter differentiation otherwise maintained. No other acute or chronic intracranial blood products. No other mass lesion, mass effect, or midline shift. Mild ex vacuo dilatation of the lateral ventricles related to the chronic bifrontal encephalomalacia. No hydrocephalus. No extra-axial fluid collection. Pituitary gland and suprasellar region within normal limits. Vascular: Major intracranial vascular flow voids are maintained. Skull and upper cervical spine:  Craniocervical junction within normal limits. Markedly decreased T1 signal intensity seen throughout the visualized bone marrow, likely related history of end-stage renal disease. No focal marrow replacing lesion. Postoperative changes from prior bifrontal craniotomy noted. Scalp soft tissues demonstrate no acute finding. Sinuses/Orbits: Left gaze noted. Globes orbital soft tissues demonstrate no other acute finding. Scattered mucosal thickening noted about the ethmoidal air cells. Small bilateral mastoid effusions noted. Visualized nasopharynx unremarkable. Other: None. IMPRESSION: 1. Motion degraded exam. 2. Postoperative changes from previous bifrontal craniotomy for meningioma resection. Underlying encephalomalacia and gliosis throughout the subjacent anterior/inferior frontal lobes bilaterally. No visible locally recurrent tumor or mass on this noncontrast motion degraded exam. 3. Gyriform restricted diffusion involving the anterior right frontal lobe, immediately adjacent to the chronic postsurgical changes, favored to reflect changes of acute seizure/status epilepticus. Correlation with EEG recommended. 4. Otherwise normal brain MRI for age. Electronically Signed   By: Jeannine Boga M.D.   On: 04/05/2022 02:09   Portable chest 1 View  Result Date: 04/05/2022 CLINICAL DATA:  Confusion, altered mental status. EXAM: PORTABLE CHEST 1 VIEW COMPARISON:  03/21/2022. FINDINGS: Heart is enlarged and the mediastinal contour is stable. Scattered airspace opacities are noted at the lung bases. There is a small left pleural effusion. No pneumothorax. A vascular stent is present in the left axilla. Surgical clips are present over the superior mediastinum. No acute osseous abnormality. IMPRESSION: 1. Mild airspace atelectasis or infiltrate at the lung bases. 2. Small left pleural effusion. 3. Cardiomegaly. Electronically Signed   By: Brett Fairy M.D.   On: 04/05/2022 01:26   CT Head Wo Contrast  Result Date:  04/25/2022 CLINICAL DATA:  Altered mental status EXAM: CT HEAD WITHOUT CONTRAST TECHNIQUE: Contiguous  axial images were obtained from the base of the skull through the vertex without intravenous contrast. RADIATION DOSE REDUCTION: This exam was performed according to the departmental dose-optimization program which includes automated exposure control, adjustment of the mA and/or kV according to patient size and/or use of iterative reconstruction technique. 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 Bifrontal encephalomalacia noted. No evidence of acute intracranial hemorrhage or infarct. No abnormal mass effect or midline shift. No abnormal intra or extra-axial mass lesion or fluid collection. Ventricular size is normal. Cerebellum is unremarkable. Vascular: No hyperdense vessel or unexpected calcification. Skull: Defects within the anterior osseous cribriform plate bilaterally, seen overlying the roof of the frontal sinuses. There is soft tissue within the frontal sinuses bilaterally and this may represent postsurgical change and/or fat packing though a CSF leak could appear similarly. No acute fracture. Sinuses/Orbits: As noted above, there is soft tissue as well as macroscopic fat within the frontal sinuses. Remaining paranasal sinuses are clear. Orbits are unremarkable. Other: Mastoid air cells and middle ear cavities are clear. IMPRESSION: 1. 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 Soft tissue within the frontal sinuses bilaterally may represent postsurgical change and/or fat packing though a CSF leak could appear similarly. Correlation with clinical examination is recommended. 2. 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 3. No evidence of acute intracranial hemorrhage or infarct. Electronically Signed    By: Fidela Salisbury M.D.   On: 04/09/2022 22:50    PMH:   Past Medical History:  Diagnosis Date   Anemia of chronic disease    Arthritis    Deceased-donor kidney transplant    Performed at [facility], April 2010.  Initial ESRD due to HTN nephropathy   Eczema    ESRD (end stage renal disease) ([place])    M/W/F dialysis   FUO (fever of unknown origin) 05/17/2015   GERD (gastroesophageal reflux disease)    Headache(784.0)    History of hyperparathyroidism    Hypertension    Peritonitis ([place]) 10/2019   Shortness of breath    Wears glasses     PSH:   Past Surgical History:  Procedure Laterality Date   A/V FISTULAGRAM Left 02/12/2017   Procedure: A/V Fistulagram;  Surgeon: Algernon Huxley, MD;  Location: [facility];  Service: Cardiovascular;  Laterality: Left;   A/V FISTULAGRAM Left 12/30/2017   Procedure: A/V FISTULAGRAM;  Surgeon: Algernon Huxley, MD;  Location: [facility];  Service: Cardiovascular;  Laterality: Left;   A/V FISTULAGRAM Left 04/01/2021   Procedure: A/V FISTULAGRAM;  Surgeon: Algernon Huxley, MD;  Location: [place] [facility];  Service: Cardiovascular;  Laterality: Left;   A/V SHUNT INTERVENTION N/A 02/12/2017   Procedure: A/V Shunt Intervention;  Surgeon: Algernon Huxley, MD;  Location: [place] [facility];  Service: Cardiovascular;  Laterality: N/A;   AV FISTULA PLACEMENT     BASCILIC VEIN TRANSPOSITION Left 10/30/2014   Procedure: LEFT Tensed;  Surgeon: Rosetta Posner, MD;  Location: [facility];  Service: Vascular;  Laterality: Left;   [place] Left 01/03/2015   Procedure: LEFT ARM 2ND STAGE [place];  Surgeon: Rosetta Posner, MD;  Location: [facility];  Service: Vascular;  Laterality: Left;   BIOPSY  10/23/2021   Procedure: BIOPSY;  Surgeon: Jackquline Denmark, MD;  Location[facility];  Service: Gastroenterology;;   BREAST  BIOPSY Left    Patient doesn't remember any information from previous procedure.   CAPD  REMOVAL N/A 10/31/2019   Procedure: PERITONEAL DIALYSIS  (CAPD) INFECTED CATHETER REMOVAL;  Surgeon: Coralie Keens, MD;  Location: [facility];  Service: General;  Laterality: N/A;   CRANIOTOMY N/A 11/01/2020   Procedure: CRANIOTOMY FOR TUMOR EXCISION;  Surgeon: [name], MD;  Location: [facility];  Service: Neurosurgery;  Laterality: N/A;   ESOPHAGOGASTRODUODENOSCOPY (EGD) WITH PROPOFOL N/A 10/23/2021   Procedure: ESOPHAGOGASTRODUODENOSCOPY (EGD) WITH PROPOFOL;  Surgeon: Jackquline Denmark, MD;  Location: [facility];  Service: Gastroenterology;  Laterality: N/A;   FRACTURE SURGERY     left foot,baby toe nad next toe missing   INSERTION OF DIALYSIS CATHETER Right 10/30/2014   Procedure: INSERTION OF DIALYSIS CATHETER;  Surgeon: Rosetta Posner, MD;  Location: [facility];  Service: Vascular;  Laterality: Right;   KIDNEY TRANSPLANT  11/02/2008   [facility])   PARATHYROIDECTOMY N/A 03/18/2022   Procedure: TOTAL PARATHYROIDECTOMY;  Surgeon: Armandina Gemma, MD;  Location: [facility];  Service: General;  Laterality: N/A;   PLACEMENT OF LUMBAR DRAIN N/A 11/01/2020   Procedure: PLACEMENT OF LUMBAR DRAIN;  Surgeon: [name], MD;  Location: [facility];  Service: Neurosurgery;  Laterality: N/A;   WISDOM TOOTH EXTRACTION      Allergies:  Allergies  Allergen Reactions   Penicillins Itching and Rash    Did it involve swelling of the face/tongue/throat, SOB, or low BP?Y Did it involve sudden or severe rash/hives, skin peeling, or any reaction on the inside of your mouth or nose? Y Did you need to seek medical attention at a hospital or doctor's office? Y When did it last happen?  2016     If all above answers are "NO", may proceed with cephalosporin use.    Medications:   Prior to Admission medications   Medication Sig Start Date End Date Taking? Authorizing Provider  albuterol (VENTOLIN HFA) 108 (90 Base) MCG/ACT inhaler Inhale 3 puffs into the lungs daily as needed for wheezing or shortness of  breath. 11/12/21 03/21/22  Gaylan Gerold, DO  amLODipine (NORVASC) 10 MG tablet Take 1 tablet (10 mg total) by mouth daily. 07/02/21   Lajean Manes, MD  calcitRIOL (ROCALTROL) 0.5 MCG capsule Take 4 capsules (2 mcg total) by mouth 2 (two) times daily between meals. 03/31/22   Sanjuan Dame, MD  calcitRIOL (ROCALTROL) 0.5 MCG capsule Take 2 capsules (1 mcg total) by mouth every Monday, Wednesday, and Friday. 03/31/22   Sanjuan Dame, MD  Calcium Carbonate Antacid (CALCIUM CARBONATE, DOSED IN MG ELEMENTAL CALCIUM,) 1250 MG/5ML SUSP Take 24 mLs (2,400 mg of elemental calcium total) by mouth 4 (four) times daily. 03/31/22   Sanjuan Dame, MD  carvedilol (COREG) 25 MG tablet Take 1 tablet (25 mg total) by mouth 2 (two) times daily. 11/07/21 11/07/22  Gaylan Gerold, DO  diclofenac Sodium (VOLTAREN) 1 % GEL Apply 2 g topically 2 (two) times daily as needed (Right shoulder pain). Patient taking differently: Apply 1 Application topically as needed (Right shoulder pain). 01/20/22   Gaylan Gerold, DO  diphenhydrAMINE (BENADRYL) 25 mg capsule Take 50 mg by mouth 3 (three) times daily as needed for allergies.    [provider]  lidocaine-prilocaine (EMLA) cream Apply 1 application. topically every Monday, Wednesday, and Friday with hemodialysis. 02/01/20   [provider]  pantoprazole (PROTONIX) 40 MG tablet Take 1 tablet (40 mg total) by mouth 2 (two) times daily before a meal. Patient not taking: Reported  on 03/19/2022 10/24/21 02/13/22  Rosezetta Schlatter, MD  Polyethylene Glycol 400 (BLINK TEARS OP) Place 1 drop into both eyes daily as needed (tired eyes). Patient not taking: Reported on 03/19/2022    [provider]  sertraline (ZOLOFT) 50 MG tablet Take 1 tablet (50 mg total) by mouth daily. 07/02/21 07/02/22  Lajean Manes, MD    Discontinued Meds:  There are no discontinued medications.  Social History:  reports that she has been smoking cigarettes. She has a 13.50 pack-year smoking  history. She has never used smokeless tobacco. She reports that she does not drink alcohol and does not use drugs.  Family History:   Family History  Problem Relation Age of Onset   Hypertension Mother    Hypertension Father    Hypertension Sister    Hyperlipidemia Sister    Hypertension Sister    Diabetes Brother    Deep vein thrombosis Brother    Kidney disease Brother        on HD   Colon cancer Neg Hx    Esophageal cancer Neg Hx    Rectal cancer Neg Hx    Breast cancer Neg Hx     Blood pressure (!) 143/78, pulse 83, temperature 98.1 F (36.7 C), temperature source Axillary, resp. rate 18, last menstrual period 06/02/2015, SpO2 97 %. General: stable female in NAD Neuro: pleasant but somnolent, arousable and answering questions appropriately Heart: RRR no MRG Lungs: CTA bilaterally Abdomen: NABS soft NT, ND Extremities: No pedal edema Dialysis Access: LUE AV fistula positive bruit   Malikye Reppond, Hunt Oris, MD 04/05/2022, 6:05 AM

## 2022-04-05 NOTE — Progress Notes (Signed)
Notified MD Allyson Sabal of patient's critical calcium of 5.7. MD notified at [phone].

## 2022-04-05 NOTE — Progress Notes (Signed)
Pt in MRI. RN will notify when ready.

## 2022-04-05 NOTE — ED Notes (Signed)
Ativan 0.'5mg'$  administered in CT. Pt continues to have intermittent facial spasms and left sided repetitive twitching.

## 2022-04-05 NOTE — Progress Notes (Signed)
She has had marked clinical improvement following Ativan 1 mg and Depacon adjunct.  She still has electrographic seizures in the right frontal region, but is awake, alert, oriented.  She states that she does feel slightly confused.  Given how focal these discharges are, would not favor aggressive progression to intubation, etc.  I will repeat Ativan x1 and load with Vimpat, and optimize the dose of Keppra and Depakote that she is on.  Roland Rack, MD [facility] [phone]  If 7pm- 7am, please page neurology on call as listed in [place].

## 2022-04-05 NOTE — Progress Notes (Signed)
LTM EEG hooked up and running - no initial skin breakdown - push button tested - neuro notified. [facility] monitoring.  

## 2022-04-05 NOTE — Progress Notes (Signed)
Pt is now OTF.

## 2022-04-05 NOTE — Evaluation (Signed)
Physical Therapy Evaluation Patient Details Name: Ruth Gutierrez MRN: [id number] DOB: 1965/08/19 Today's Date: 04/05/2022  History of Present Illness  The pt is a 56 yo female presenting 9/1 after onset of dizziness and confusion at outpatient HD. Upon arrival, pt found to be in status epilepticus. PMH includes: end-stage renal disease on HD, HTN, depression, meningioma s/p resection in 2022, and recent parathyroidectomy on 8/15, left AMA 8/17 but returned later that same day with abdominal pain, d/c home 8/25.   Clinical Impression  Pt in bed upon arrival of PT, agreeable to evaluation at this time. Prior to admission the pt was independent with use of cane in the home, independent with ADLs, and able to drive. The pt now presents with limitations in functional mobility, alertness, strength, stability, and activity tolerance due to above dx, and will continue to benefit from skilled PT to address these deficits. The pt was lethargic this session, likely due to medications, but was able to follow simple commands intermittently. Required modA to complete bed mobility this morning, further mobility deferred due to lethargy. Will need continued intervention to progress OOB mobility, and SNF rehab after to facilitate recovery towards independence prior to return home.         Recommendations for follow up therapy are one component of a multi-disciplinary discharge planning process, led by the attending physician.  Recommendations may be updated based on patient status, additional functional criteria and insurance authorization.  Follow Up Recommendations Skilled nursing-short term rehab (<3 hours/day) Can patient physically be transported by private vehicle: No    Assistance Recommended at Discharge Frequent or constant Supervision/Assistance  Patient can return home with the following  A lot of help with walking and/or transfers;A lot of help with bathing/dressing/bathroom;Assistance with  feeding;Direct supervision/assist for financial management;Direct supervision/assist for medications management;Assist for transportation;Help with stairs or ramp for entrance    Equipment Recommendations  (defer until gait assessment)  Recommendations for Other Services       Functional Status Assessment Patient has had a recent decline in their functional status and demonstrates the ability to make significant improvements in function in a reasonable and predictable amount of time.     Precautions / Restrictions Precautions Precautions: Fall;Other (comment) Precaution Comments: seizure, on continuous EEG Restrictions Weight Bearing Restrictions: No      Mobility  Bed Mobility Overal bed mobility: Needs Assistance Bed Mobility: Supine to Sit, Sit to Supine     Supine to sit: Mod assist, +2 for safety/equipment Sit to supine: Mod assist   General bed mobility comments: modA to complete transition to EOB with pt falling forwards or to R intermittently. likely due to lethargy and limited arousal. VSS    Transfers                   General transfer comment: deferred due to lethargy      Balance Overall balance assessment: Needs assistance Sitting-balance support: Single extremity supported, Feet supported Sitting balance-Leahy Scale: Poor Sitting balance - Comments: moments of minG, min-modA to maintain static sitting with fatigue                                     Pertinent Vitals/Pain Pain Assessment Pain Assessment: Faces Faces Pain Scale: Hurts a little bit Pain Location: head Pain Descriptors / Indicators: Heaviness Pain Intervention(s): Limited activity within patient's tolerance, Monitored during session, Repositioned    Home Living Family/patient  expects to be discharged to:: Private residence Living Arrangements: Children Available Help at Discharge: Family;Available 24 hours/day Type of Home: House           Home Equipment:  Conservation officer, nature (2 wheels);Cane - single point      Prior Function Prior Level of Function : Independent/Modified Independent;Driving             Mobility Comments: walks with cane, per family no falls. pt states she like to do "nothing" at home. family reports she plays with grandkids ADLs Comments: pt and family report independence, still drives. not working     Journalist, newspaper        Extremity/Trunk Assessment   Upper Extremity Assessment Upper Extremity Assessment: Defer to OT evaluation    Lower Extremity Assessment Lower Extremity Assessment: Generalized weakness    Cervical / Trunk Assessment Cervical / Trunk Assessment: Kyphotic  Communication   Communication: Expressive difficulties;Other (comment) (likely due to medicine/lethargy)  Cognition Arousal/Alertness: Lethargic Behavior During Therapy: Flat affect Overall Cognitive Status: Difficult to assess                                 General Comments: limited assessment due to lethargy. Pt able to answer ~50% of questions accurately immediately, needed repeated instructions to follow commands and increased time. likely due to elthargy        General Comments General comments (skin integrity, edema, etc.): VSS on RA    Exercises     Assessment/Plan    PT Assessment Patient needs continued PT services  PT Problem List Decreased strength;Decreased range of motion;Decreased activity tolerance;Decreased balance;Decreased mobility;Decreased coordination;Decreased cognition;Decreased safety awareness       PT Treatment Interventions DME instruction;Gait training;Stair training;Functional mobility training;Therapeutic activities;Therapeutic exercise;Balance training;Patient/family education    PT Goals (Current goals can be found in the Care Plan section)  Acute Rehab PT Goals Patient Stated Goal: none stated, per framily to go home PT Goal Formulation: With patient Time For Goal Achievement:  04/19/22 Potential to Achieve Goals: Good    Frequency Min 3X/week     Co-evaluation PT/OT/SLP Co-Evaluation/Treatment: Yes Reason for Co-Treatment: Necessary to address cognition/behavior during functional activity;For patient/therapist safety;To address functional/ADL transfers PT goals addressed during session: Mobility/safety with mobility;Balance;Strengthening/ROM         AM-PAC PT "6 Clicks" Mobility  Outcome Measure Help needed turning from your back to your side while in a flat bed without using bedrails?: A Lot Help needed moving from lying on your back to sitting on the side of a flat bed without using bedrails?: Total Help needed moving to and from a bed to a chair (including a wheelchair)?: Total Help needed standing up from a chair using your arms (e.g., wheelchair or bedside chair)?: Total Help needed to walk in hospital room?: Total Help needed climbing 3-5 steps with a railing? : Total 6 Click Score: 7    End of Session   Activity Tolerance: Patient limited by lethargy Patient left: in bed;with call bell/phone within reach;with bed alarm set;with family/visitor present Nurse Communication: Mobility status PT Visit Diagnosis: Other abnormalities of gait and mobility (R26.89);Muscle weakness (generalized) (M62.81)    Time: 0093-8182 PT Time Calculation (min) (ACUTE ONLY): 22 min   Charges:   PT Evaluation $PT Eval Moderate Complexity: 1 Mod          [place] Carbo, PT, DPT   Acute Rehabilitation Department  Sandra Cockayne 04/05/2022, 1:20 PM

## 2022-04-05 NOTE — Procedures (Signed)
Patient Name: Ruth Gutierrez  MRN: [id number]  Epilepsy Attending: Lora Havens  Referring Physician/Provider: Lora Havens, MD Duration: 04/05/2022 0221 to 04/06/2022 0221   Patient history: 56 y.o. female with PMH significant forESRD on HD, headaches, history of meningioma with bifrontal severe vasogenic edema s/p resection in April 2022, HTN who became confused and lethargic during HD and was sent to the ED. EEG to evaluate for seizure   Level of alertness:  lethargic    AEDs during EEG study: LEV, VPA, LCM   Technical aspects: This EEG study was done with scalp electrodes positioned according to the 10-20 International system of electrode placement. Electrical activity was reviewed with band pass filter of 1-'70Hz'$ , sensitivity of 7 uV/mm, display speed of 43m/sec with a '60Hz'$  notched filter applied as appropriate. EEG data were recorded continuously and digitally stored.  Video monitoring was available and reviewed as appropriate.   Description: EEG initially showed sharply contoured 12-'15hz'$  beta activity admixed with 5-'6hz'$  theta slowing in right frontal region with evolution in morphology and frequency and at times involves all of right hemisphere. No clinical signs were seen during this time. This eeg pattern is consistent with electrographic status epilepticus. There is also continuous generalized 3 to 6 Hz theta-delta slowing. As medications were adjusted, status epilepticus resolved after around 1230 on 04/05/2022. EEG then showed lateralized rhythmic delta activity in  right hemisphere. Intermittently 15-'18Hz'$  beta activity was also noted in right frontal region which at times evolved to 3-'5hz'$  theta-delta slowing and involved right hemisphere consistent with seizure. No clinical signs were seen during this eeg change. This was consistent with  foal seizures without clinical signs, average 4/hour, lasting about 50 seconds on average. Hyperventilation and photic stimulation were not  performed.      ABNORMALITY - Electrographic status epilepticus, right frontal region - Continuous slow, generalized   IMPRESSION: This study initially showed evidence of electrographic status epilepticus arising from right frontal region. As medications were adjusted, status epilepticus resolved after around 1230 on 04/05/2022. EEG then showed seizures without clinical signs arising from right frontal region, average 4/hour, lasting about 5 seconds.  Additionally there was severe diffuse encephalopathy, nonspecific etiology but likely related to seizure.   Dr. KLeonel Ramsaywas notified.     Christyana Corwin OBarbra Sarks

## 2022-04-05 NOTE — Progress Notes (Signed)
EEG complete - results pending 

## 2022-04-05 NOTE — ED Notes (Signed)
Patient transported to MRI 

## 2022-04-05 NOTE — Progress Notes (Signed)
Patient has had both electrographic and clinical improvement since beginning the EEG.  Unfortunately, she still has very frequent evolving electrographic focal seizures with continuous fast activity (breach versus ictal).  On exam, she is lethargic but I am able to arouse her with repeated noxious stimulation.  She states "ouch, that hurts".  I am not able to get her to follow commands.  She appears to be protecting airway at this time.  I would favor giving her some benzodiazepine as well as completing a 40 mg/kg load of Depacon (received 1500 mg earlier, 2500 would be 40/kg).  If she continues to be hypocalcemic, I think that her seizures will continue to be very difficult to control.  1) Ativan 1 mg x 1 2) Depacon 1 g x 1 3) consider progressive unit 4) correction of hypocalcemia, I sent a stat ionized calcium   Roland Rack, MD [facility] [phone]  If 7pm- 7am, please page neurology on call as listed in [place].

## 2022-04-05 NOTE — Evaluation (Signed)
Occupational Therapy Evaluation Patient Details Name: Ruth Gutierrez MRN: [id number] DOB: [birth year] Today's Date: 04/05/2022   History of Present Illness The pt is a 56 yo female presenting 9/1 after onset of dizziness and confusion at outpatient HD. Upon arrival, pt found to be in status epilepticus. PMH includes: end-stage renal disease on HD, HTN, depression, meningioma s/p resection in 2022, and recent parathyroidectomy on 8/15, left AMA 8/17 but returned later that same day with abdominal pain, d/c home 8/25.   Clinical Impression   Prior to this admission, patient was living with her daughter and grandchildren, and completing all ADLs independently, and driving herself to dialysis. Currently, Patient presenting with decreased cognition, decreased activity tolerance, and need for assist for all ADLs. The pt was lethargic this session, likely due to medications (receiving IV ativan during session), but was able to follow simple commands intermittently. Required modA to complete bed mobility this morning, further mobility deferred due to lethargy.  Patient is currently max A for all ADLs. Will need continued intervention to progress OOB mobility, and SNF rehab after to facilitate recovery towards independence prior to return home.      Recommendations for follow up therapy are one component of a multi-disciplinary discharge planning process, led by the attending physician.  Recommendations may be updated based on patient status, additional functional criteria and insurance authorization.   Follow Up Recommendations  Skilled nursing-short term rehab (<3 hours/day)    Assistance Recommended at Discharge Frequent or constant Supervision/Assistance  Patient can return home with the following A lot of help with walking and/or transfers;A lot of help with bathing/dressing/bathroom;Assistance with cooking/housework;Assistance with feeding;Direct supervision/assist for medications  management;Direct supervision/assist for financial management;Assist for transportation;Help with stairs or ramp for entrance    Functional Status Assessment  Patient has had a recent decline in their functional status and demonstrates the ability to make significant improvements in function in a reasonable and predictable amount of time.  Equipment Recommendations  Other (comment) (Defer to next venue)    Recommendations for Other Services       Precautions / Restrictions Precautions Precautions: Fall;Other (comment) Precaution Comments: seizure, on continuous EEG Restrictions Weight Bearing Restrictions: No      Mobility Bed Mobility Overal bed mobility: Needs Assistance Bed Mobility: Supine to Sit, Sit to Supine     Supine to sit: Mod assist, +2 for safety/equipment Sit to supine: Mod assist   General bed mobility comments: modA to complete transition to EOB with pt falling forwards or to R intermittently. likely due to lethargy and limited arousal. VSS    Transfers                   General transfer comment: deferred due to lethargy      Balance Overall balance assessment: Needs assistance Sitting-balance support: Single extremity supported, Feet supported Sitting balance-Leahy Scale: Poor Sitting balance - Comments: moments of minG, min-modA to maintain static sitting with fatigue                                   ADL either performed or assessed with clinical judgement   ADL Overall ADL's : Needs assistance/impaired Eating/Feeding: Maximal assistance;Sitting   Grooming: Maximal assistance;Sitting   Upper Body Bathing: Maximal assistance;Sitting   Lower Body Bathing: Maximal assistance;Total assistance;Sit to/from stand;Sitting/lateral leans   Upper Body Dressing : Maximal assistance;Sitting   Lower Body Dressing: Maximal assistance;Total assistance;Sitting/lateral leans;Sit to/from stand  Toilet Transfer Details (indicate cue  type and reason): deferred to due level of arousal and continous EEG Toileting- Clothing Manipulation and Hygiene: Maximal assistance;Sitting/lateral lean;Sit to/from stand       Functional mobility during ADLs: Maximal assistance;+2 for physical assistance;+2 for safety/equipment;Cueing for sequencing;Cueing for safety General ADL Comments: Patient presenting with decreased cognition, decreased activity tolerance, and need for assist for all ADLs     Vision Baseline Vision/History: 0 No visual deficits Ability to See in Adequate Light: 0 Adequate Patient Visual Report:  (Will continue to assess) Additional Comments: patient recieving ativan mid session per MD order, therefore patient having a difficult time following commands after IV ativanpatient recieving ativan mid session per MD order, therefore patient having a difficult time following commands after IV ativan as well as keeping eyes open to assess     Perception     Praxis      Pertinent Vitals/Pain Pain Assessment Pain Assessment: Faces Faces Pain Scale: Hurts a little bit Pain Location: head Pain Descriptors / Indicators: Heaviness Pain Intervention(s): Limited activity within patient's tolerance, Monitored during session, Repositioned     Hand Dominance     Extremity/Trunk Assessment Upper Extremity Assessment Upper Extremity Assessment: Generalized weakness;Difficult to assess due to impaired cognition (patient recieving ativan mid session per MD order, therefore patient having a difficult time following commands after IV ativan)   Lower Extremity Assessment Lower Extremity Assessment: Defer to PT evaluation   Cervical / Trunk Assessment Cervical / Trunk Assessment: Kyphotic   Communication Communication Communication: Expressive difficulties;Other (comment) (likely due to medicine/lethargy)   Cognition Arousal/Alertness: Lethargic Behavior During Therapy: Flat affect Overall Cognitive Status: Difficult to  assess                                 General Comments: limited assessment due to lethargy. Pt able to answer ~50% of questions accurately immediately, needed repeated instructions to follow commands and increased time. likely due to elthargy     General Comments  VSS on RA    Exercises     Shoulder Instructions      Home Living Family/patient expects to be discharged to:: Private residence Living Arrangements: Children Available Help at Discharge: Family;Available 24 hours/day Type of Home: House                       Home Equipment: Conservation officer, nature (2 wheels);Cane - single point          Prior Functioning/Environment Prior Level of Function : Independent/Modified Independent;Driving             Mobility Comments: walks with cane, per family no falls. pt states she like to do "nothing" at home. family reports she plays with grandkids ADLs Comments: pt and family report independence, still drives. not working        OT Problem List: Decreased strength;Decreased range of motion;Decreased activity tolerance;Impaired balance (sitting and/or standing);Decreased coordination;Decreased cognition;Decreased safety awareness;Decreased knowledge of use of DME or AE;Decreased knowledge of precautions      OT Treatment/Interventions: Self-care/ADL training;Therapeutic exercise;Neuromuscular education;Energy conservation;DME and/or AE instruction;Therapeutic activities;Cognitive remediation/compensation;Patient/family education;Balance training;Manual therapy    OT Goals(Current goals can be found in the care plan section) Acute Rehab OT Goals Patient Stated Goal: unable to state OT Goal Formulation: Patient unable to participate in goal setting Time For Goal Achievement: 04/19/22 Potential to Achieve Goals: Fair ADL Goals Pt Will Perform Lower Body Bathing: with  min assist Pt Will Perform Lower Body Dressing: with min assist Pt Will Transfer to Toilet:  with min assist;ambulating Pt Will Perform Toileting - Clothing Manipulation and hygiene: with min assist Additional ADL Goal #1: Patient will demonstrate increased cognitive awareness to follow 2-3 step task consistently without need for cues to reorient. Additional ADL Goal #2: Patient will be able to demonstrate increased activity tolerance to complete functional task in standing for 3-5 minutes without need for seated rest break.  OT Frequency: Min 2X/week    Co-evaluation PT/OT/SLP Co-Evaluation/Treatment: Yes Reason for Co-Treatment: For patient/therapist safety;To address functional/ADL transfers;Necessary to address cognition/behavior during functional activity;Complexity of the patient's impairments (multi-system involvement) PT goals addressed during session: Mobility/safety with mobility;Balance;Strengthening/ROM OT goals addressed during session: ADL's and self-care;Proper use of Adaptive equipment and DME;Strengthening/ROM      AM-PAC OT "6 Clicks" Daily Activity     Outcome Measure Help from another person eating meals?: A Lot Help from another person taking care of personal grooming?: A Lot Help from another person toileting, which includes using toliet, bedpan, or urinal?: A Lot Help from another person bathing (including washing, rinsing, drying)?: A Lot Help from another person to put on and taking off regular upper body clothing?: A Lot Help from another person to put on and taking off regular lower body clothing?: A Lot 6 Click Score: 12   End of Session Nurse Communication: Mobility status  Activity Tolerance: Patient limited by fatigue;Other (comment) (IV ativan) Patient left: in bed;with call bell/phone within reach;with bed alarm set;with nursing/sitter in room;with family/visitor present  OT Visit Diagnosis: Unsteadiness on feet (R26.81);Other abnormalities of gait and mobility (R26.89);Muscle weakness (generalized) (M62.81);Other symptoms and signs involving  cognitive function                Time: 1035-1057 OT Time Calculation (min): 22 min Charges:  OT General Charges $OT Visit: 1 Visit OT Evaluation $OT Eval Moderate Complexity: 1 Mod  Corinne Ports E. Seara Hinesley, OTR/L Acute Rehabilitation Services [phone]   Ascencion Dike 04/05/2022, 3:41 PM

## 2022-04-05 NOTE — Consult Note (Signed)
NEUROLOGY CONSULTATION NOTE   Date of service: April 05, 2022 Patient Name: Ruth Gutierrez MRN:  [id number] DOB:  1966/06/12 Reason for consult: "Encephalopathy, status epilepticus on cEEG" Requesting Provider: Velna Ochs, MD _ _ _   _ __   _ __ _ _  __ __   _ __   __ _  History of Present Illness  Ruth Gutierrez is a 56 y.o. female with PMH significant for ESRD on HD, headaches, history of meningioma with bifrontal severe vasogenic edema s/p resection in April 2022, HTN who became confused and lethargic during HD and was sent to the ED for further evaluation.  MRI Brain with postop changes with underlying encephalomalacia and gliosis in anteroinferior bilateral frontal lobes.  MRI also demonstrated gyriform diffusion restriction of the anterior right frontal lobe suggestive of acute seizure/status epilepticus.  She had a stat EEG which demonstrated status epilepticus likely coming from Right frontal lobe. Neurology consulted for further evaluation.  Mother is at bedside and a couple family members over the phone.  They provided most of the history as patient is encephalopathic and unable to provide any meaningful history.  Family reports that patient has no prior history of seizures.  She was diagnosed with meningiomas after reporting severe bifrontal headaches.  No recent head injuries, no history of CNS infection, she recently had her thyroid removed in hospital had a fever but was afebrile over the last week with no chills, no URI or UTI-like symptoms.  She will occasionally drink tequila about once a week.  She does not binge drink.  Family also reports that they noticed twitching of her left face and arm in the ED last night around 2130. This was attributed by team to be due to hypocalcemia from recent parathyroid resection.   ROS   Unable to obtain secondary to encephalopathy.  Past History   Past Medical History:  Diagnosis Date   Anemia of chronic disease     Arthritis    Deceased-donor kidney transplant    Performed at [facility], April 2010.  Initial ESRD due to HTN nephropathy   Eczema    ESRD (end stage renal disease) ([place])    M/W/F dialysis   FUO (fever of unknown origin) 05/17/2015   GERD (gastroesophageal reflux disease)    Headache(784.0)    History of hyperparathyroidism    Hypertension    Peritonitis ([place]) 10/2019   Shortness of breath    Wears glasses    Past Surgical History:  Procedure Laterality Date   A/V FISTULAGRAM Left 02/12/2017   Procedure: A/V Fistulagram;  Surgeon: Algernon Huxley, MD;  Location: [place] [facility];  Service: Cardiovascular;  Laterality: Left;   A/V FISTULAGRAM Left 12/30/2017   Procedure: A/V FISTULAGRAM;  Surgeon: Algernon Huxley, MD;  Location: [place] [facility];  Service: Cardiovascular;  Laterality: Left;   A/V FISTULAGRAM Left 04/01/2021   Procedure: A/V FISTULAGRAM;  Surgeon: Algernon Huxley, MD;  Location: [place] [facility];  Service: Cardiovascular;  Laterality: Left;   A/V SHUNT INTERVENTION N/A 02/12/2017   Procedure: A/V Shunt Intervention;  Surgeon: Algernon Huxley, MD;  Location: [place] [facility];  Service: Cardiovascular;  Laterality: N/A;   AV FISTULA PLACEMENT     BASCILIC VEIN TRANSPOSITION Left 10/30/2014   Procedure: LEFT [place];  Surgeon: Rosetta Posner, MD;  Location: [place];  Service: Vascular;  Laterality: Left;   [place] Left 01/03/2015   Procedure: LEFT ARM 2ND  STAGE BASCILIC VEIN TRANSPOSITION;  Surgeon: Rosetta Posner, MD;  Location: [place];  Service: Vascular;  Laterality: Left;   BIOPSY  10/23/2021   Procedure: BIOPSY;  Surgeon: Jackquline Denmark, MD;  Location: [facility];  Service: Gastroenterology;;   BREAST BIOPSY Left    Patient doesn't remember any information from previous procedure.   CAPD REMOVAL N/A 10/31/2019   Procedure: PERITONEAL DIALYSIS  (CAPD) INFECTED CATHETER REMOVAL;  Surgeon: Coralie Keens, MD;  Location: [place];   Service: General;  Laterality: N/A;   CRANIOTOMY N/A 11/01/2020   Procedure: CRANIOTOMY FOR TUMOR EXCISION;  Surgeon: [name], MD;  Location: [place];  Service: Neurosurgery;  Laterality: N/A;   ESOPHAGOGASTRODUODENOSCOPY (EGD) WITH PROPOFOL N/A 10/23/2021   Procedure: ESOPHAGOGASTRODUODENOSCOPY (EGD) WITH PROPOFOL;  Surgeon: Jackquline Denmark, MD;  Location: [place];  Service: Gastroenterology;  Laterality: N/A;   FRACTURE SURGERY     left foot,baby toe nad next toe missing   INSERTION OF DIALYSIS CATHETER Right 10/30/2014   Procedure: INSERTION OF DIALYSIS CATHETER;  Surgeon: Rosetta Posner, MD;  Location: [place];  Service: Vascular;  Laterality: Right;   KIDNEY TRANSPLANT  11/02/2008   [facility])   PARATHYROIDECTOMY N/A 03/18/2022   Procedure: TOTAL PARATHYROIDECTOMY;  Surgeon: Armandina Gemma, MD;  Location: [place];  Service: General;  Laterality: N/A;   PLACEMENT OF LUMBAR DRAIN N/A 11/01/2020   Procedure: PLACEMENT OF LUMBAR DRAIN;  Surgeon: [name], MD;  Location: [place];  Service: Neurosurgery;  Laterality: N/A;   WISDOM TOOTH EXTRACTION     Family History  Problem Relation Age of Onset   Hypertension Mother    Hypertension Father    Hypertension Sister    Hyperlipidemia Sister    Hypertension Sister    Diabetes Brother    Deep vein thrombosis Brother    Kidney disease Brother        on HD   Colon cancer Neg Hx    Esophageal cancer Neg Hx    Rectal cancer Neg Hx    Breast cancer Neg Hx    Social History   Socioeconomic History   Marital status: Single    Spouse name: Not on file   Number of children: 1   Years of education: Not on file   Highest education level: Not on file  Occupational History   Occupation: unemployed  Tobacco Use   Smoking status: Every Day    Packs/day: 0.50    Years: 27.00    Total pack years: 13.50    Types: Cigarettes   Smokeless tobacco: Never   Tobacco comments:    10 cigarettes a day  Vaping Use   Vaping Use:  Never used  Substance and Sexual Activity   Alcohol use: No    Alcohol/week: 0.0 standard drinks of alcohol    Comment: occasional drinker noted in the past   Drug use: No   Sexual activity: Yes    Partners: Male    Birth control/protection: None  Other Topics Concern   Not on file  Social History Narrative   Single, 1 daughter   Lives with daughter (born 61) and grandkids   sister helps her with medications etc.   cigarette smoker, rare EtOH, no drugs   Social Determinants of Health   Financial Resource Strain: Not on file  Food Insecurity: Not on file  Transportation Needs: No Transportation Needs (03/31/2022)   PRAPARE - Transportation    Lack of Transportation (Medical): No    Lack of  Transportation (Non-Medical): No  Physical Activity: Not on file  Stress: Not on file  Social Connections: Not on file   Allergies  Allergen Reactions   Penicillins Itching and Rash    Did it involve swelling of the face/tongue/throat, SOB, or low BP?Y Did it involve sudden or severe rash/hives, skin peeling, or any reaction on the inside of your mouth or nose? Y Did you need to seek medical attention at a hospital or doctor's office? Y When did it last happen?  2016     If all above answers are "NO", may proceed with cephalosporin use.    Medications   Medications Prior to Admission  Medication Sig Dispense Refill Last Dose   albuterol (VENTOLIN HFA) 108 (90 Base) MCG/ACT inhaler Inhale 3 puffs into the lungs daily as needed for wheezing or shortness of breath. 18 g 0    amLODipine (NORVASC) 10 MG tablet Take 1 tablet (10 mg total) by mouth daily. 90 tablet 2    calcitRIOL (ROCALTROL) 0.5 MCG capsule Take 4 capsules (2 mcg total) by mouth 2 (two) times daily between meals. 240 capsule 0    calcitRIOL (ROCALTROL) 0.5 MCG capsule Take 2 capsules (1 mcg total) by mouth every Monday, Wednesday, and Friday. 30 capsule 0    Calcium Carbonate Antacid (CALCIUM CARBONATE, DOSED IN MG  ELEMENTAL CALCIUM,) 1250 MG/5ML SUSP Take 24 mLs (2,400 mg of elemental calcium total) by mouth 4 (four) times daily. 473 mL 3    carvedilol (COREG) 25 MG tablet Take 1 tablet (25 mg total) by mouth 2 (two) times daily. 180 tablet 3    diclofenac Sodium (VOLTAREN) 1 % GEL Apply 2 g topically 2 (two) times daily as needed (Right shoulder pain). (Patient taking differently: Apply 1 Application topically as needed (Right shoulder pain).) 100 g 0    diphenhydrAMINE (BENADRYL) 25 mg capsule Take 50 mg by mouth 3 (three) times daily as needed for allergies.      lidocaine-prilocaine (EMLA) cream Apply 1 application. topically every Monday, Wednesday, and Friday with hemodialysis.      pantoprazole (PROTONIX) 40 MG tablet Take 1 tablet (40 mg total) by mouth 2 (two) times daily before a meal. (Patient not taking: Reported on 03/19/2022) 112 tablet 1    Polyethylene Glycol 400 (BLINK TEARS OP) Place 1 drop into both eyes daily as needed (tired eyes). (Patient not taking: Reported on 03/19/2022)      sertraline (ZOLOFT) 50 MG tablet Take 1 tablet (50 mg total) by mouth daily. 90 tablet 2      Vitals   Vitals:   04/29/2022 2145 04/07/2022 2245 04/05/22 0100 04/05/22 0136  BP: (!) 143/78 (!) 145/84 (!) 154/84 (!) 159/94  Pulse: 82 80 92 86  Resp: 17 18 (!) 22 19  Temp:   99.1 F (37.3 C) 99.2 F (37.3 C)  TempSrc:   Axillary Axillary  SpO2: 95% 95% 100% 92%     There is no height or weight on file to calculate BMI.  Physical Exam   General: Laying comfortably in bed; somnolent. HENT: Normal oropharynx and mucosa. Normal external appearance of ears and nose.  Neck: Supple, no pain or tenderness  CV: No JVD. No peripheral edema.  Pulmonary: Symmetric Chest rise. Normal respiratory effort.  Abdomen: Soft to touch, non-tender.  Ext: No cyanosis, edema, or deformity  Skin: No rash. Normal palpation of skin.   Musculoskeletal: Normal digits and nails by inspection. No clubbing.   Neurologic  Examination  Mental status/Cognition:  She will partially open her eyes to vigorous tactile stimulation but not to loud voice.  Nonsensical speech with almost word salad. Speech/language: limited eval 2/2 encephalopathy. Non sensical speech, does not follow commands. Cranial nerves:   CN II Pupils equal and reactive to light, unable to assess for VF deficit. Makes brief eye contact on the right.   CN III,IV,VI Intermittently has a R gaze preference, will cross midline to dolls eyes. No nystagmus.   CN V Corneals intact BL   CN VII no asymmetry, no nasolabial fold flattening   CN VIII    CN IX & X    CN XI Head turned to right.   CN XII midline tongue   Sensory/Motor:  Muscle bulk: normal, tone normal. Localizes in all extremities. Moving RUE and RLE more so than LUE and LLE.  Labs   CBC:  Recent Labs  Lab 04/15/2022 2019 05/02/2022 2346 04/05/22 0219  WBC 10.5  --  9.6  HGB 9.1* 8.8* 8.5*  HCT 28.3* 26.0* 25.7*  MCV 89.3  --  88.3  PLT 217  --  130    Basic Metabolic Panel:  Lab Results  Component Value Date   NA 136 04/06/2022   K 3.9 04/29/2022   CO2 26 04/28/2022   GLUCOSE 161 (H) 04/27/2022   BUN 31 (H) 04/08/2022   CREATININE 8.18 (H) 04/29/2022   CALCIUM 5.5 (LL) 05/02/2022   GFRNONAA 5 (L) 04/27/2022   GFRAA 9 (L) 11/08/2019   Lipid Panel: No results found for: "LDLCALC" HgbA1c:  Lab Results  Component Value Date   HGBA1C 5.0 12/20/2020   Urine Drug Screen:     Component Value Date/Time   LABOPIA NONE DETECTED 02/02/2013 1240   COCAINSCRNUR NONE DETECTED 02/02/2013 1240   LABBENZ NONE DETECTED 02/02/2013 1240   AMPHETMU NONE DETECTED 02/02/2013 1240   THCU NONE DETECTED 02/02/2013 1240   LABBARB NONE DETECTED 02/02/2013 1240    Alcohol Level     Component Value Date/Time   ETH <10 10/29/2020 0944    MRI Brain(Personally reviewed): 1. Motion degraded exam. 2. Postoperative changes from previous bifrontal craniotomy for meningioma resection.  Underlying encephalomalacia and gliosis throughout the subjacent anterior/inferior frontal lobes bilaterally. No visible locally recurrent tumor or mass on this noncontrast motion degraded exam. 3. Gyriform restricted diffusion involving the anterior right frontal lobe, immediately adjacent to the chronic postsurgical changes, favored to reflect changes of acute seizure/status epilepticus. Correlation with EEG recommended. 4. Otherwise normal brain MRI for age.  rEEG:  Status epilepticus, likely originating from R frontal lobe.  Impression   Ruth Gutierrez is a 56 y.o. female with PMH significant forESRD on HD, headaches, history of meningioma with bifrontal severe vasogenic edema s/p resection in April 2022, HTN who became confused and lethargic during HD and was sent to the ED. Here, workup demonstrated gyriform diffusion restriction on MRI Brain concerning for seizures/status epilepticus and STAT EEG with status epilepticus.  Recommendations  - Avoided Ativan right away as she was very somnolent but still protecting her airway. - AEDS:  Keppra '3000mg'$  IV once.  Valproic acid: '1500mg'$  IV once.  Next steps if still in status, will do either Vimpat or fosphenytoin load, may need to be intubated for sedation with Versed or porpofol if she continues to be in status epilepticus. - maintenance Keppra '1000mg'$  IV or PO daily along with '500mg'$  PO or IV on MWF after HD. - cEEG - seizure precautions - neurology will continue to follow along. ______________________________________________________________________  This patient is critically ill and at significant risk of neurological worsening, death and care requires constant monitoring of vital signs, hemodynamics,respiratory and cardiac monitoring, neurological assessment, discussion with family, other specialists and medical decision making of high complexity. I spent 40 minutes of neurocritical care time  in the care of  this patient. This  was time spent independent of any time provided by nurse practitioner or PA.  Donnetta Simpers [facility] Pager Number [phone] 04/05/2022  3:30 AM   Thank you for the opportunity to take part in the care of this patient. If you have any further questions, please contact the neurology consultation attending.  Signed,  [place] Pager Number [phone] _ _ _   _ __   _ __ _ _  __ __   _ __   __ _

## 2022-04-06 DIAGNOSIS — G9341 Metabolic encephalopathy: Secondary | ICD-10-CM | POA: Diagnosis not present

## 2022-04-06 LAB — CBC
HCT: 23.9 % — ABNORMAL LOW (ref 36.0–46.0)
Hemoglobin: 7.8 g/dL — ABNORMAL LOW (ref 12.0–15.0)
MCH: 29.2 pg (ref 26.0–34.0)
MCHC: 32.6 g/dL (ref 30.0–36.0)
MCV: 89.5 fL (ref 80.0–100.0)
Platelets: 172 10*3/uL (ref 150–400)
RBC: 2.67 MIL/uL — ABNORMAL LOW (ref 3.87–5.11)
RDW: 18 % — ABNORMAL HIGH (ref 11.5–15.5)
WBC: 7.4 10*3/uL (ref 4.0–10.5)
nRBC: 0.3 % — ABNORMAL HIGH (ref 0.0–0.2)

## 2022-04-06 LAB — BASIC METABOLIC PANEL
Anion gap: 17 — ABNORMAL HIGH (ref 5–15)
BUN: 39 mg/dL — ABNORMAL HIGH (ref 6–20)
CO2: 23 mmol/L (ref 22–32)
Calcium: 5.4 mg/dL — CL (ref 8.9–10.3)
Chloride: 98 mmol/L (ref 98–111)
Creatinine, Ser: 10.69 mg/dL — ABNORMAL HIGH (ref 0.44–1.00)
GFR, Estimated: 4 mL/min — ABNORMAL LOW (ref >60)
Glucose, Bld: 74 mg/dL (ref 70–99)
Potassium: 4.3 mmol/L (ref 3.5–5.1)
Sodium: 138 mmol/L (ref 135–145)

## 2022-04-06 LAB — THYROTROPIN RECEPTOR AUTOABS: Thyrotropin Receptor Ab: <1.1 IU/L (ref 0.00–1.75)

## 2022-04-06 LAB — HEPATITIS B SURFACE ANTIBODY, QUANTITATIVE: Hep B S AB Quant (Post): 21.7 m[IU]/mL (ref >9.9)

## 2022-04-06 LAB — T3, FREE: T3, Free: 3.1 pg/mL (ref 2.0–4.4)

## 2022-04-06 LAB — MAGNESIUM: Magnesium: 2.4 mg/dL (ref 1.7–2.4)

## 2022-04-06 LAB — CALCIUM: Calcium: 5.7 mg/dL — CL (ref 8.9–10.3)

## 2022-04-06 MED ORDER — CALCIUM CARBONATE ANTACID 1250 MG/5ML PO SUSP
1000.0000 mg | Freq: Three times a day (TID) | ORAL | Status: DC
  Administered 2022-04-06 (×2): 1000 mg
  Filled 2022-04-06 (×3): qty 10

## 2022-04-06 MED ORDER — SODIUM CHLORIDE 0.9 % IV SOLN
10.0000 mg/kg | Freq: Once | INTRAVENOUS | Status: AC
  Administered 2022-04-06: 635 mg via INTRAVENOUS
  Filled 2022-04-06: qty 12.7

## 2022-04-06 MED ORDER — CALCIUM GLUCONATE-NACL 2-0.675 GM/100ML-% IV SOLN
2.0000 g | Freq: Once | INTRAVENOUS | Status: AC
  Administered 2022-04-06: 2000 mg via INTRAVENOUS
  Filled 2022-04-06: qty 100

## 2022-04-06 MED ORDER — AMLODIPINE BESYLATE 10 MG PO TABS
10.0000 mg | ORAL_TABLET | Freq: Every day | ORAL | Status: DC
  Administered 2022-04-06: 10 mg
  Filled 2022-04-06: qty 1

## 2022-04-06 MED ORDER — CALCITRIOL 1 MCG/ML PO SOLN
2.0000 ug | Freq: Two times a day (BID) | ORAL | Status: DC
  Administered 2022-04-07: 2 ug via ORAL
  Filled 2022-04-06 (×4): qty 2

## 2022-04-06 MED ORDER — SODIUM CHLORIDE 0.9 % IV SOLN
4.0000 g | Freq: Once | INTRAVENOUS | Status: AC
  Administered 2022-04-06: 4 g via INTRAVENOUS
  Filled 2022-04-06: qty 40

## 2022-04-06 MED ORDER — AMLODIPINE BESYLATE 10 MG PO TABS
10.0000 mg | ORAL_TABLET | Freq: Every day | ORAL | Status: DC
  Administered 2022-04-07: 10 mg via ORAL
  Filled 2022-04-06: qty 1

## 2022-04-06 MED ORDER — PHENYTOIN SODIUM 50 MG/ML IJ SOLN
100.0000 mg | Freq: Three times a day (TID) | INTRAMUSCULAR | Status: DC
  Administered 2022-04-07 – 2022-04-09 (×8): 100 mg via INTRAVENOUS
  Filled 2022-04-06 (×11): qty 2

## 2022-04-06 MED ORDER — CALCIUM GLUCONATE-NACL 2-0.675 GM/100ML-% IV SOLN
2.0000 g | Freq: Once | INTRAVENOUS | Status: DC
Start: 2022-04-06 — End: 2022-04-06
  Filled 2022-04-06: qty 100

## 2022-04-06 MED ORDER — CALCIUM GLUCONATE 10 % IV SOLN
4.0000 g | Freq: Once | INTRAVENOUS | Status: DC
  Filled 2022-04-06: qty 40

## 2022-04-06 MED ORDER — AMLODIPINE BESYLATE 10 MG PO TABS: 10.0000 mg | ORAL_TABLET | Freq: Every day | ORAL | Status: DC

## 2022-04-06 MED ORDER — CALCITRIOL 1 MCG/ML PO SOLN
2.0000 ug | Freq: Two times a day (BID) | ORAL | Status: DC
  Administered 2022-04-06: 2 ug
  Filled 2022-04-06 (×2): qty 2

## 2022-04-06 MED ORDER — SODIUM CHLORIDE 0.9 % IV SOLN
150.0000 mg | Freq: Two times a day (BID) | INTRAVENOUS | Status: DC
  Administered 2022-04-06 – 2022-04-11 (×12): 150 mg via INTRAVENOUS
  Filled 2022-04-06 (×15): qty 15

## 2022-04-06 MED ORDER — CALCIUM CARBONATE ANTACID 1250 MG/5ML PO SUSP
1000.0000 mg | Freq: Three times a day (TID) | ORAL | Status: DC
  Filled 2022-04-06: qty 10

## 2022-04-06 MED ORDER — SODIUM CHLORIDE 0.9 % IV SOLN
4.0000 g | Freq: Once | INTRAVENOUS | Status: AC
Start: 2022-04-06 — End: 2022-04-07
  Administered 2022-04-07: 4 g via INTRAVENOUS
  Filled 2022-04-06: qty 40

## 2022-04-06 NOTE — Progress Notes (Signed)
Subjective: She is slightly more encephalopathic than when I saw her yesterday morning, but much better than after sedation with Ativan.  She has had some improvement electrographically, now with more discrete seizures as opposed to continuous partial status.  Exam: Vitals:   04/06/22 0000 04/06/22 0400  BP: (!) 151/79 (!) 164/91  Pulse: 78 80  Resp: 20 20  Temp: 98 F (36.7 C) 97.6 F (36.4 C)  SpO2: 96% 97%   Gen: In bed, NAD  Neuro: MS: Awake, gives month as August, knows she is at [place] are slow, and she requires multiple attempts to get her to respond at times. CN: Visual fields full, EOMI, pupils equal and reactive Motor: He has good strength bilaterally Sensory: Intact to light touch   Pertinent Labs: Calcium 5.4  Impression: 56 year old female with status epilepticus in the setting of severe hypocalcemia and previous structural lesion.  I suspect that the hypocalcemia is a significant driver behind the refractory nature of her status epilepticus.  She is showing some improvement, but I am still quite concerned about her continued hypocalcemia contributing to difficulty with controlling seizures.  She is on three antiepileptics at this time, slightly more encephalopathic than yesterday but difficult to tell how much of this is iatrogenic due to aggressive antiepileptics and how much is due to her focal status.  I would favor changing antiepileptics, I have found sodium channel blockers to be helpful in the setting in the past, and therefore we will change Depakote to Dilantin.  Given there is some competition for protein binding, I will do a smaller load of Dilantin followed by assessment of mental status prior to continued loading.  Recommendations: 1) calcium correction per internal medicine, favor aggressive repletion 2) fosphenytoin 10 PE per KG, reassess and consider further loading afterwards 3) continue Keppra 1 g daily with 500 mg after dialysis 4)  discontinue Depacon 5) continue lacosamide at 150 twice daily 6) neurology will continue to follow  Roland Rack, MD [facility] [phone]  If 7pm- 7am, please page neurology on call as listed in [place].

## 2022-04-06 NOTE — Progress Notes (Addendum)
   Subjective:  Patient evaluated at bedside. Inially somnolent but awakens to voice. Following commands. Is mostly nodding and shaking head to questions, but does sometimes verbally answer questions. States she is hungry. She denies any pain or other complaints.  Objective:  Vital signs in last 24 hours: Vitals:   04/05/22 1928 04/06/22 0000 04/06/22 0400 04/06/22 1015  BP:  (!) 151/79 (!) 164/91 (!) 195/81  Pulse: 81 78 80 84  Resp: '20 20 20 '$ (!) 21  Temp: 98.2 F (36.8 C) 98 F (36.7 C) 97.6 F (36.4 C) 97.9 F (36.6 C)  TempSrc: Axillary Axillary Oral Axillary  SpO2: 92% 96% 97% 97%   Physical Exam: General: somnolent, lying in bed, not in acute distress HENT:   normocephalic and atraumatic, PERRL Pulmonary:  normal respiratory effort, no wheezes, rales, or rhonchi Cardiac:   regular rate and rhythm, holosystolic murmur on left sternal border Abdomen:  soft and non-distended, normoactive bowel sounds  Skin:   warm and dry  Neurologic: Awake and alert to self. Inconsistently answering questions, follow commands to move extremities, delayed responses  Assessment/Plan:  Principal Problem:   Acute metabolic encephalopathy Active Problems:   Hypertension   ESRD on hemodialysis ([place])   Status post resection of meningioma   Hypocalcemia   H/O parathyroidectomy ([place])   Status epilepticus ([facility])  Ruth Gutierrez is a 56 y/o female with history of ESRD s/p renal transplant on HD MWF, HTN, s/p parathyroidectomy, and depression that presented w/ AMS and was admitted for status epilepticus.    Altered mental status  Status epilepticus resolved Right frontal seizures HD session on 9/1 ended prematurely due to patients lethargy and confusion. EEG initially with status epilepticus resolved around 1230 on 04/05/2022. Now with seizures arising from right frontal region with severe diffuse encephalopathy. Neurology concerned her hypocalcemia is driving her seizures. - Appreciate  neurology recommendations - continue fosphenytoin, keppra, start lacosamide  - NG placement for calcium replacement - Replace calcium, see below - Continuous EEG - Seizure precautions - NPO pending SLP evaluation - PT/OT once mental status improves   Severe symptomatic hypocalcemia Parathyroidectomy 03/2022 Patient was hospitalized in 03/2022 for hypocalcemia secondary to recent parathyroidectomy. Ca remains low at 5.4, no albumin on labs today. AMS has been limiting calcium administration. NG ordered by nephology.  - Appreciate nephrology recommendations  - IV calcium gluconate 2g push this am in addition to 4 g over 6 hours - Start calcium carbonate 1,000 mg TID per tube - calcitriol 13mg twice daily per tube - repeat calcium this evening, if still not improving or worsening will order additional IV calcium gluconate 4 g over 6 hours  ESRD on HD MWF HD on MWF. Most recent HD ended prematurely. Nephro consulted.  - Appreciate nephrology recommendations  - HD MWF - Avoid nephrotoxins    Hypertension BP remains elevated. Amlodipine '10mg'$  and carvedilol '25mg'$  q12 at home.  - restart amlodipine 10 mg per tube  Hyperthyroidism TSH decreased and free T4 elevated. Trab negative - Will need RAIU scan once more stable   Diet: NPO VTE: Heparin IVF: None Code: Full  Prior to Admission Living Arrangement: home w/ daughter Anticipated Discharge Location: pending Barriers to Discharge: continued managment Dispo: Anticipated discharge in approximately more than 2 day(s).   [name] 04/06/2022, 12:51 PM Pager: [phone] 5pm on weekdays and 1pm on weekends: On Call pager [phone]

## 2022-04-06 NOTE — Progress Notes (Addendum)
Ruth Gutierrez Progress Note   56 y.o. female ESRD, h/o transplant, PTX, HTN, depression p/w AMS.  Rx only 1hr37mn of HD on 9/1 bec of lethargy and confusion  noted to be somnolent and initially thought that Ativan was contributing. Pt has been confused for 1-2 days; recently had a parathyroidecomy on 8/15 and was treated for hypocalcemia subsequently. Her regimen was supposed to be  liquid CaCO3 '2400mg'$  suspension q6hr but according to the mother she did not tolerate this in the hospital last admission and I confirmed with mother and daughter (lives w/ daughter) she did not and was not taking any Ca at home.  Ca noted to be 5.5 in the ED with a pCO2 of 18. Patient had facial twitching in the ED with subsequent MRI of the brain showed gyriform diffusion restriction of the anterior right frontal lob suggestive of acute seizure/status epilepticus. EEG confirmed that it was coming from the right frontal lobe.    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 She was NOT taking any calcium at home. - She did not tolerate the liquid calcium last hospitalization and threw it up; mother just picked up the tablets day before admission so patient had not been taking calcium at home. She has been on a higher Ca bath at outpt center. - Calcitriol 2 mics twice daily between meals, calcium carbonate 2400 mg (1gm elemental calcium )suspension 4 times daily and will also give Calcitriol 2mc50mith dialysis MWF. OK to challenge with liquid again but if she doesn't tolerate then will transition to tabs immediately. Certainly possible she didn't tolerate the liquid last admission bec she wasn't eating very much - Aggressive  replacement with CaGluconate -> currently getting another 2gm this morning; will give another 4gm IV this afternoon. Drop NGT to give the liquid calcium (1gm elemental TID) + calcitriol.   ESRD -HD now on schedule MWF - Plan on HD Monday. HTN/volume -BP variable has been under EDW at center but very close to EDW Seizures - seen by neuro -> arising from right frontal region + severe diffuse encephalopathy.  Anemia -Hgb 8.5 ESA - just received MIrcera 225mc35m 8/30.   Secondary hyperparathyroidism -see as above hypocalcemia with that plan.  Phosphorus in goal History of meningioma status postresection 11/01/2020    Subjective:   Confused, somnolent but arousable.   Objective:   BP (!) 164/91 (BP Location: Right Arm)   Pulse 80   Temp 97.6 F (36.4 C) (Oral)   Resp 20   LMP 06/02/2015   SpO2 97%  No intake or output data in the 24 hours ending 04/06/22 0709 Weight change:   Physical Exam: General: stable female in NAD Neuro: pleasant but somnolent, somnolent but easily arousable and answering questions appropriately Heart: RRR no MRG Lungs: CTA bilaterally Abdomen: NABS soft NT, ND Extremities: No pedal edema Dialysis Access: LUE AV fistula positive bruit  Imaging: Overnight EEG with video  Result Date: 04/05/2022 Ruth Gutierrez    04/05/2022  8:15 AM Patient Name: Ruth Gutierrez [id number] Attending: PriyaLora Gutierrez Physician/Provider: YadavLora HavensDuration: 04/05/2022 0221 to 0800  Patient history: 56 y.94 female with PMH significant forESRD on HD, headaches,  history of meningioma with bifrontal severe vasogenic edema s/p resection in April 2022, HTN who became confused and lethargic during HD and was sent to the ED. EEG to evaluate for seizure  Level of alertness:  lethargic  AEDs during EEG study: LEV, VPA  Technical aspects: This EEG study was done with scalp electrodes positioned according to the 10-20 International system of electrode  placement. Electrical activity was reviewed with band pass filter of 1-'70Hz'$ , sensitivity of 7 uV/mm, display speed of 45m/sec with a '60Hz'$  notched filter applied as appropriate. EEG data were recorded continuously and digitally stored.  Video monitoring was available and reviewed as appropriate.  Description: EEG showed sharply contoured 12-'15hz'$  beta activity admixed with 5-'6hz'$  theta slowing in right frontal region with evolution in morphology and frequency and at times involves all of right hemisphere. No clinical signs were seen during this time. This eeg pattern is consistent with electrographic status epilepticus.  There is also continuous generalized 3 to 6 Hz theta-delta slowing. Hyperventilation and photic stimulation were not performed.    ABNORMALITY - Electrographic status epilepticus, right frontal region - Continuous slow, generalized  IMPRESSION: This study showed evidence of electrographic status epilepticusarising from right frontal region. Additionally there is severe diffuse encephalopathy, nonspecific etiology but likely related to seizure.  Dr. KLeonel Ramsaywas notified.   PLora Gutierrez  EEG adult  Result Date: 04/05/2022 Ruth Gutierrez     04/05/2022  8:13 AM Patient Name: Ruth FOSSUMMRN: [id number] Attending: PLora HavensReferring Physician/Provider: JVirl Axe Gutierrez Date: 04/05/2022 Duration: 24.27 mins Patient history: 56y.o. female with PMH significant forESRD on HD, headaches, history of meningioma with bifrontal severe vasogenic edema s/p resection in April 2022, HTN who became confused and lethargic during HD and was sent to the ED. EEG to evaluate for seizure Level of alertness:  lethargic AEDs during EEG study: None Technical aspects: This EEG study was done with scalp electrodes positioned according to the 10-20 International system of electrode placement. Electrical activity was reviewed with band pass filter of 1-'70Hz'$ , sensitivity of 7 uV/mm, display  speed of 353msec with a '60Hz'$  notched filter applied as appropriate. EEG data were recorded continuously and digitally stored.  Video monitoring was available and reviewed as appropriate. Description: EEG showed sharply contoured 12-'15hz'$  beta activity admixed with 5-'6hz'$  theta slowing in right frontal region with evolution in morphology and frequency and at times involves all of right hemisphere. No clinical signs were seen during this time. This eeg pattern is consistent with electrographic status epilepticus.  There is also continuous generalized 3 to 6 Hz theta-delta slowing.  Hyperventilation and photic stimulation were not performed.   ABNORMALITY - Electrographic status epilepticus, right frontal region - Continuous slow, generalized IMPRESSION: This study showed evidence of electrographic status epilepticusarising from right frontal region. Additionally there is severe diffuse encephalopathy, nonspecific etiology but likely related to seizure. Dr. KhLorrin Goodellas notified.at 02TavaresPrLora Gutierrez MR BRAIN WO CONTRAST  Result Date: 04/05/2022 CLINICAL DATA:  Initial evaluation for delirium. EXAM: MRI HEAD WITHOUT CONTRAST TECHNIQUE: Multiplanar, multiecho pulse sequences of the brain and surrounding structures were obtained without intravenous contrast. COMPARISON:  Prior CT from 04/05/2022 and MRI from 11/04/2020. FINDINGS: Brain: Examination degraded by motion artifact. Postoperative changes from previous bifrontal craniotomy for meningioma resection are seen. Underlying encephalomalacia and gliosis seen throughout the subjacent anterior/inferior frontal lobes bilaterally. Extension to involve the gyrus recti. No visible residual tumor or mass on this noncontrast motion  degraded exam. Gyriform restricted diffusion is seen involving the anterior right frontal lobe, immediately adjacent to the chronic postsurgical changes (series 5, image 80). Diffusion signal abnormality spans arterial vascular  territories, with changes confined to the cortical gray matter. Overall appearance is favored to reflect changes of acute seizure/status epilepticus. No significant regional mass effect. No other evidence for acute or subacute ischemia or changes related to seizure. Gray-white matter differentiation otherwise maintained. No other acute or chronic intracranial blood products. No other mass lesion, mass effect, or midline shift. Mild ex vacuo dilatation of the lateral ventricles related to the chronic bifrontal encephalomalacia. No hydrocephalus. No extra-axial fluid collection. Pituitary gland and suprasellar region within normal limits. Vascular: Major intracranial vascular flow voids are maintained. Skull and upper cervical spine: Craniocervical junction within normal limits. Markedly decreased T1 signal intensity seen throughout the visualized bone marrow, likely related history of end-stage renal disease. No focal marrow replacing lesion. Postoperative changes from prior bifrontal craniotomy noted. Scalp soft tissues demonstrate no acute finding. Sinuses/Orbits: Left gaze noted. Globes orbital soft tissues demonstrate no other acute finding. Scattered mucosal thickening noted about the ethmoidal air cells. Small bilateral mastoid effusions noted. Visualized nasopharynx unremarkable. Other: None. IMPRESSION: 1. Motion degraded exam. 2. Postoperative changes from previous bifrontal craniotomy for meningioma resection. Underlying encephalomalacia and gliosis throughout the subjacent anterior/inferior frontal lobes bilaterally. No visible locally recurrent tumor or mass on this noncontrast motion degraded exam. 3. Gyriform restricted diffusion involving the anterior right frontal lobe, immediately adjacent to the chronic postsurgical changes, favored to reflect changes of acute seizure/status epilepticus. Correlation with EEG recommended. 4. Otherwise normal brain MRI for age. Electronically Signed   By: Jeannine Boga M.D.   On: 04/05/2022 02:09   Portable chest 1 View  Result Date: 04/05/2022 CLINICAL DATA:  Confusion, altered mental status. EXAM: PORTABLE CHEST 1 VIEW COMPARISON:  03/21/2022. FINDINGS: Heart is enlarged and the mediastinal contour is stable. Scattered airspace opacities are noted at the lung bases. There is a small left pleural effusion. No pneumothorax. A vascular stent is present in the left axilla. Surgical clips are present over the superior mediastinum. No acute osseous abnormality. IMPRESSION: 1. Mild airspace atelectasis or infiltrate at the lung bases. 2. Small left pleural effusion. 3. Cardiomegaly. Electronically Signed   By: Brett Fairy M.D.   On: 04/05/2022 01:26   CT Head Wo Contrast  Result Date: 04/14/2022 CLINICAL DATA:  Altered mental status EXAM: CT HEAD WITHOUT CONTRAST TECHNIQUE: Contiguous axial images were obtained from the base of the skull through the vertex without intravenous contrast. RADIATION DOSE REDUCTION: This exam was performed according to the departmental dose-optimization program which includes automated exposure control, adjustment of the mA and/or kV according to patient size and/or use of iterative reconstruction technique. 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 Bifrontal encephalomalacia noted. No evidence of acute intracranial hemorrhage or infarct. No abnormal mass effect or midline shift. No abnormal intra or extra-axial mass lesion or fluid collection. Ventricular size is normal. Cerebellum is unremarkable. Vascular: No hyperdense vessel or unexpected calcification. Skull: Defects within the anterior osseous cribriform plate bilaterally, seen overlying the roof of the frontal sinuses. There is soft tissue within the frontal sinuses bilaterally and this may represent postsurgical change and/or fat packing though a CSF leak could  appear similarly. No acute fracture. Sinuses/Orbits: As noted above, there is soft tissue as well as macroscopic fat within the frontal  sinuses. Remaining paranasal sinuses are clear. Orbits are unremarkable. Other: Mastoid air cells and middle ear cavities are clear. IMPRESSION: 1. 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 Soft tissue within the frontal sinuses bilaterally may represent postsurgical change and/or fat packing though a CSF leak could appear similarly. Correlation with clinical examination is recommended. 2. 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 3. No evidence of acute intracranial hemorrhage or infarct. Electronically Signed   By: Fidela Salisbury M.D.   On: 04/18/2022 22:50    Labs: BMET Recent Labs  Lab 04/17/2022 2019 04/06/2022 2346 04/05/22 0219 04/05/22 0926 04/05/22 2023 04/06/22 0359  NA 137 136 138  --   --  138  K 4.1 3.9 4.0  --   --  4.3  CL 96*  --  98  --   --  98  CO2 26  --  27  --   --  23  GLUCOSE 161*  --  111*  --   --  74  BUN 31*  --  33*  --   --  39*  CREATININE 8.18*  --  8.96*  --   --  10.69*  CALCIUM 5.5*  --  5.7* 5.1* 5.4* 5.4*   CBC Recent Labs  Lab 04/12/2022 2019 04/07/2022 2346 04/05/22 0219 04/06/22 0359  WBC 10.5  --  9.6 7.4  HGB 9.1* 8.8* 8.5* 7.8*  HCT 28.3* 26.0* 25.7* 23.9*  MCV 89.3  --  88.3 89.5  PLT 217  --  180 172    Medications:     Chlorhexidine Gluconate Cloth  6 each Topical Q0600   heparin  5,000 Units Subcutaneous Q8H   levETIRAcetam  1,000 mg Oral Daily   [START ON 04/07/2022] levETIRAcetam  500 mg Oral Once per day on Mon Wed Fri      Otelia Santee, Gutierrez 04/06/2022, 7:09 AM

## 2022-04-06 NOTE — Progress Notes (Signed)
At bedside re IVT consult.  Current PIV working well and adequate IV access for current IV medications.  Due to limited extremity from HD fistula/graft, will hold on 2nd PIV access at  this time.  Ranjita RN aware and in agreement.  RN also states pt pulling out IV's and confused.

## 2022-04-06 NOTE — Progress Notes (Signed)
Family was concerned about heparin shot as pt had bleeding episodes previously.Informed doctor about it.Got order to watch for signs of bleeding and continue it.No any signs of bleeding for now

## 2022-04-06 NOTE — Evaluation (Signed)
Clinical/Bedside Swallow Evaluation Patient Details  Name: Ruth Gutierrez MRN: [id number] Date of Birth: [birth year]  Today's Date: 04/06/2022 Time: SLP Start Time (ACUTE ONLY): 1448 SLP Stop Time (ACUTE ONLY): 1511 SLP Time Calculation (min) (ACUTE ONLY): 23 min  Past Medical History:  Past Medical History:  Diagnosis Date   Anemia of chronic disease    Arthritis    Deceased-donor kidney transplant    Performed at [facility], April 2010.  Initial ESRD due to HTN nephropathy   Eczema    ESRD (end stage renal disease) ([place])    M/W/F dialysis   FUO (fever of unknown origin) 05/17/2015   GERD (gastroesophageal reflux disease)    Headache(784.0)    History of hyperparathyroidism    Hypertension    Peritonitis ([place]) 10/2019   Shortness of breath    Wears glasses    Past Surgical History:  Past Surgical History:  Procedure Laterality Date   A/V FISTULAGRAM Left 02/12/2017   Procedure: A/V Fistulagram;  Surgeon: Algernon Huxley, MD;  Location: [place] [facility] LAB;  Service: Cardiovascular;  Laterality: Left;   A/V FISTULAGRAM Left 12/30/2017   Procedure: A/V FISTULAGRAM;  Surgeon: Algernon Huxley, MD;  Location: [place] [facility];  Service: Cardiovascular;  Laterality: Left;   A/V FISTULAGRAM Left 04/01/2021   Procedure: A/V FISTULAGRAM;  Surgeon: Algernon Huxley, MD;  Location: [place] [facility];  Service: Cardiovascular;  Laterality: Left;   A/V SHUNT INTERVENTION N/A 02/12/2017   Procedure: A/V Shunt Intervention;  Surgeon: Algernon Huxley, MD;  Location: [place] [facility];  Service: Cardiovascular;  Laterality: N/A;   AV FISTULA PLACEMENT     BASCILIC VEIN TRANSPOSITION Left 10/30/2014   Procedure: LEFT [place];  Surgeon: Rosetta Posner, MD;  Location: [place];  Service: Vascular;  Laterality: Left;   [place] Left 01/03/2015   Procedure: LEFT ARM 2ND STAGE [place];  Surgeon: Rosetta Posner, MD;  Location: [place];  Service:  Vascular;  Laterality: Left;   BIOPSY  10/23/2021   Procedure: BIOPSY;  Surgeon: Jackquline Denmark, MD;  Location[facility] END[facility];  Service: Gastroenterology;;   BREAST BIOPSY Left    Patient doesn't remember any information from previous procedure.   CAPD REMOVAL N/A 10/31/2019   Procedure: PERITONEAL DIALYSIS  (CAPD) INFECTED CATHETER REMOVAL;  Surgeon: Coralie Keens, MD;  Location: [place];  Service: General;  Laterality: N/A;   CRANIOTOMY N/A 11/01/2020   Procedure: CRANIOTOMY FOR TUMOR EXCISION;  Surgeon: [name], MD;  Location: [place];  Service: Neurosurgery;  Laterality: N/A;   ESOPHAGOGASTRODUODENOSCOPY (EGD) WITH PROPOFOL N/A 10/23/2021   Procedure: ESOPHAGOGASTRODUODENOSCOPY (EGD) WITH PROPOFOL;  Surgeon: Jackquline Denmark, MD;  Location: [place];  Service: Gastroenterology;  Laterality: N/A;   FRACTURE SURGERY     left foot,baby toe nad next toe missing   INSERTION OF DIALYSIS CATHETER Right 10/30/2014   Procedure: INSERTION OF DIALYSIS CATHETER;  Surgeon: Rosetta Posner, MD;  Location: [place];  Service: Vascular;  Laterality: Right;   KIDNEY TRANSPLANT  11/02/2008   [facility])   PARATHYROIDECTOMY N/A 03/18/2022   Procedure: TOTAL PARATHYROIDECTOMY;  Surgeon: Armandina Gemma, MD;  Location: [place];  Service: General;  Laterality: N/A;   PLACEMENT OF LUMBAR DRAIN N/A 11/01/2020   Procedure: PLACEMENT OF LUMBAR DRAIN;  Surgeon: [name], MD;  Location: [place];  Service: Neurosurgery;  Laterality: N/A;   WISDOM TOOTH EXTRACTION     HPI:  Pt is a  56 year old female who presented for altered mental status. MRI negative for acute changes. EEG 9/2: electrographic status epilepticus arising from right frontal region. As medications were adjusted, status epilepticus resolved after around 1230 on 04/05/2022. EEG then showed seizures without clinical signs arising from right frontal region, average 4/hour, lasting about 5 seconds.  Severe diffuse encephalopathy. PMH: ESRD post  transplant on dialysis, parathyroidectomy, hypertension, bifrontal craniotomy for meningioma resection, and depression.    Assessment / Plan / Recommendation  Clinical Impression  Pt was seen for bedside swallow evaluation with her family present who denied the pt having a history of dysphagia. Oral mechanism exam was limited due to pt's difficulty following commands; however, oral mucosa was moist and dentition was adequate with maxillary dentures and reduced, natural mandibular teeth. Pt exhibited symptoms or oropharyngeal dysphagia characterized by reduced bolus awareness, oral holding, impaired mastication and a single instance of throat clearing with a dual consistency bolus (i.e., peaches with liquid). Pt required frequent cues for mastication and was noted to swallow chopped peaches whole when prompts were not provided. Pt's dysphagia is likely cognitively based and SLP anticipates improvement as mentation improves. A dysphagia 2 diet with thin liquids is recommended at this time. SLP will follow to ensure diet tolerance and for advancement as clinically indicated. SLP Visit Diagnosis: Dysphagia, unspecified (R13.10)    Aspiration Risk  Mild aspiration risk    Diet Recommendation Dysphagia 2 (Fine chop);Thin liquid   Liquid Administration via: Cup;Straw Medication Administration: Whole meds with puree (or with thin liquids as tolerated) Supervision: Full supervision/cueing for compensatory strategies Compensations: Slow rate;Small sips/bites;Minimize environmental distractions Postural Changes: Seated upright at 90 degrees    Other  Recommendations Oral Care Recommendations: Oral care BID    Recommendations for follow up therapy are one component of a multi-disciplinary discharge planning process, led by the attending physician.  Recommendations may be updated based on patient status, additional functional criteria and insurance authorization.  Follow up Recommendations  (TBD)       Assistance Recommended at Discharge Frequent or constant Supervision/Assistance  Functional Status Assessment Patient has had a recent decline in their functional status and demonstrates the ability to make significant improvements in function in a reasonable and predictable amount of time.  Frequency and Duration min 2x/week  2 weeks       Prognosis Prognosis for Safe Diet Advancement: Good Barriers to Reach Goals: Cognitive deficits      Swallow Study   General Date of Onset: 04/05/22 HPI: Pt is a 57 year old female who presented for altered mental status. MRI negative for acute changes. EEG 9/2: electrographic status epilepticus arising from right frontal region. As medications were adjusted, status epilepticus resolved after around 1230 on 04/05/2022. EEG then showed seizures without clinical signs arising from right frontal region, average 4/hour, lasting about 5 seconds.  Severe diffuse encephalopathy. PMH: ESRD post transplant on dialysis, parathyroidectomy, hypertension, bifrontal craniotomy for meningioma resection, and depression. Type of Study: Bedside Swallow Evaluation Previous Swallow Assessment: none Diet Prior to this Study: NPO Temperature Spikes Noted: No Respiratory Status: Room air History of Recent Intubation: No Behavior/Cognition: Alert;Cooperative;Distractible;Requires cueing;Doesn't follow directions Oral Care Completed by SLP: No Oral Cavity - Dentition: Adequate natural dentition Self-Feeding Abilities: Total assist Patient Positioning: Upright in bed;Postural control adequate for testing Baseline Vocal Quality: Normal Volitional Cough: Cognitively unable to elicit Volitional Swallow: Unable to elicit    Oral/Motor/Sensory Function Overall Oral Motor/Sensory Function:  (difficult to assess)   Ice Chips Ice chips: Within functional limits Presentation:  Spoon   Thin Liquid Thin Liquid: Within functional limits Presentation: Spoon    Nectar Thick Nectar  Thick Liquid: Not tested   Honey Thick Honey Thick Liquid: Not tested   Puree Puree: Impaired Presentation: Spoon Oral Phase Impairments: Poor awareness of bolus Oral Phase Functional Implications: Oral holding   Solid     Solid: Impaired Presentation: Spoon;Self Fed Oral Phase Impairments: Impaired mastication;Poor awareness of bolus Oral Phase Functional Implications: Impaired mastication;Oral residue Pharyngeal Phase Impairments: Throat Clearing - Immediate     Kierrah Kilbride I. Hardin Negus, [place], [place] Patent Office number [place] 04/06/2022,3:21 PM

## 2022-04-06 NOTE — Progress Notes (Signed)
Pt pulled off her EEG leads, had to re-hook. Let RN know that pt is trying to pull them off again as well as she keeps trying to get out of  bed

## 2022-04-06 NOTE — Progress Notes (Signed)
   04/06/22 1848  Provider Notification  Date Provider Notified 04/06/22  Time Provider Notified [id number]  Method of Notification Page  Notification Reason Critical result  Test performed and critical result Calcium level-5.7  Date Critical Result Received 04/06/22  Time Critical Result Received 0102  Provider response See new orders  Date of Provider Response 04/06/22  Time of Provider Response 1851

## 2022-04-06 NOTE — Procedures (Incomplete)
Patient Name: Ruth Gutierrez  MRN: [id number]  Epilepsy Attending: Lora Havens  Referring Physician/Provider: Lora Havens, MD Duration: 04/06/2022 0221 to 04/07/2022 0221   Patient history: 56 y.o. female with PMH significant forESRD on HD, headaches, history of meningioma with bifrontal severe vasogenic edema s/p resection in April 2022, HTN who became confused and lethargic during HD and was sent to the ED. EEG to evaluate for seizure   Level of alertness:  lethargic    AEDs during EEG study: LEV, VPA, LCM   Technical aspects: This EEG study was done with scalp electrodes positioned according to the 10-20 International system of electrode placement. Electrical activity was reviewed with band pass filter of 1-'70Hz'$ , sensitivity of 7 uV/mm, display speed of 60m/sec with a '60Hz'$  notched filter applied as appropriate. EEG data were recorded continuously and digitally stored.  Video monitoring was available and reviewed as appropriate.   Description: EEG showed continuous generalized 3 to 6 Hz theta-delta slowing. Lateralized rhythmic delta activity was also noted in right hemisphere. Intermittently 15-'18Hz'$  beta activity was also noted in right frontal region which evolved to 3-'5hz'$  theta-delta slowing and involved right hemisphere consistent with seizure. No clinical signs were seen during this eeg change. This was consistent with  foal seizures without clinical signs, average 4/hour, lasting about 50 seconds to 2 minutes. Hyperventilation and photic stimulation were not performed.      ABNORMALITY - seizure without clinical signs, right frontal region - Continuous slow, generalized   IMPRESSION: This study showed seizures without clinical signs arising from right frontal region, average 4/hour, lasting about 50 seconds to 2 minutes.  Additionally there was severe diffuse encephalopathy, nonspecific etiology but likely related to seizure.   Persis Graffius OBarbra Sarks

## 2022-04-06 NOTE — Progress Notes (Signed)
Repaired EEG leads again. Placed cap

## 2022-04-07 ENCOUNTER — Inpatient Hospital Stay (HOSPITAL_COMMUNITY): Payer: Medicare Other

## 2022-04-07 DIAGNOSIS — G40901 Epilepsy, unspecified, not intractable, with status epilepticus: Secondary | ICD-10-CM

## 2022-04-07 DIAGNOSIS — G40802 Other epilepsy, not intractable, without status epilepticus: Secondary | ICD-10-CM | POA: Diagnosis not present

## 2022-04-07 DIAGNOSIS — R4182 Altered mental status, unspecified: Secondary | ICD-10-CM | POA: Diagnosis not present

## 2022-04-07 DIAGNOSIS — G9341 Metabolic encephalopathy: Secondary | ICD-10-CM

## 2022-04-07 LAB — CBC
HCT: 26.7 % — ABNORMAL LOW (ref 36.0–46.0)
HCT: 30.2 % — ABNORMAL LOW (ref 36.0–46.0)
Hemoglobin: 8.6 g/dL — ABNORMAL LOW (ref 12.0–15.0)
Hemoglobin: 9.2 g/dL — ABNORMAL LOW (ref 12.0–15.0)
MCH: 29.1 pg (ref 26.0–34.0)
MCH: 29.2 pg (ref 26.0–34.0)
MCHC: 32.2 g/dL (ref 30.0–36.0)
MCHC: 30.5 g/dL (ref 30.0–36.0)
MCV: 90.2 fL (ref 80.0–100.0)
MCV: 95.9 fL (ref 80.0–100.0)
Platelets: 179 10*3/uL (ref 150–400)
Platelets: 152 10*3/uL (ref 150–400)
RBC: 2.96 MIL/uL — ABNORMAL LOW (ref 3.87–5.11)
RBC: 3.15 MIL/uL — ABNORMAL LOW (ref 3.87–5.11)
RDW: 18.6 % — ABNORMAL HIGH (ref 11.5–15.5)
RDW: 19 % — ABNORMAL HIGH (ref 11.5–15.5)
WBC: 9.8 10*3/uL (ref 4.0–10.5)
WBC: 14.8 10*3/uL — ABNORMAL HIGH (ref 4.0–10.5)
nRBC: 0 % (ref 0.0–0.2)
nRBC: 0.7 % — ABNORMAL HIGH (ref 0.0–0.2)

## 2022-04-07 LAB — POCT I-STAT 7, (LYTES, BLD GAS, ICA,H+H)
Acid-base deficit: 6 mmol/L — ABNORMAL HIGH (ref 0.0–2.0)
Bicarbonate: 20.9 mmol/L (ref 20.0–28.0)
Calcium, Ion: 1.07 mmol/L — ABNORMAL LOW (ref 1.15–1.40)
HCT: 28 % — ABNORMAL LOW (ref 36.0–46.0)
Hemoglobin: 9.5 g/dL — ABNORMAL LOW (ref 12.0–15.0)
O2 Saturation: 99 %
Patient temperature: 99
Potassium: 5.9 mmol/L — ABNORMAL HIGH (ref 3.5–5.1)
Sodium: 134 mmol/L — ABNORMAL LOW (ref 135–145)
TCO2: 22 mmol/L (ref 22–32)
pCO2 arterial: 48.1 mmHg — ABNORMAL HIGH (ref 32–48)
pH, Arterial: 7.248 — ABNORMAL LOW (ref 7.35–7.45)
pO2, Arterial: 187 mmHg — ABNORMAL HIGH (ref 83–108)

## 2022-04-07 LAB — COMPREHENSIVE METABOLIC PANEL
ALT: 13 U/L (ref 0–44)
ALT: 90 U/L — ABNORMAL HIGH (ref 0–44)
AST: 13 U/L — ABNORMAL LOW (ref 15–41)
AST: 116 U/L — ABNORMAL HIGH (ref 15–41)
Albumin: 2.6 g/dL — ABNORMAL LOW (ref 3.5–5.0)
Albumin: 2.6 g/dL — ABNORMAL LOW (ref 3.5–5.0)
Alkaline Phosphatase: 573 U/L — ABNORMAL HIGH (ref 38–126)
Alkaline Phosphatase: 650 U/L — ABNORMAL HIGH (ref 38–126)
Anion gap: 20 — ABNORMAL HIGH (ref 5–15)
Anion gap: 20 — ABNORMAL HIGH (ref 5–15)
BUN: 47 mg/dL — ABNORMAL HIGH (ref 6–20)
BUN: 49 mg/dL — ABNORMAL HIGH (ref 6–20)
CO2: 23 mmol/L (ref 22–32)
CO2: 19 mmol/L — ABNORMAL LOW (ref 22–32)
Calcium: 6.4 mg/dL — CL (ref 8.9–10.3)
Calcium: 8.8 mg/dL — ABNORMAL LOW (ref 8.9–10.3)
Chloride: 96 mmol/L — ABNORMAL LOW (ref 98–111)
Chloride: 98 mmol/L (ref 98–111)
Creatinine, Ser: 12.06 mg/dL — ABNORMAL HIGH (ref 0.44–1.00)
Creatinine, Ser: 13.34 mg/dL — ABNORMAL HIGH (ref 0.44–1.00)
GFR, Estimated: 3 mL/min — ABNORMAL LOW (ref >60)
GFR, Estimated: 3 mL/min — ABNORMAL LOW (ref >60)
Glucose, Bld: 89 mg/dL (ref 70–99)
Glucose, Bld: 83 mg/dL (ref 70–99)
Potassium: 5.2 mmol/L — ABNORMAL HIGH (ref 3.5–5.1)
Potassium: 5.9 mmol/L — ABNORMAL HIGH (ref 3.5–5.1)
Sodium: 139 mmol/L (ref 135–145)
Sodium: 137 mmol/L (ref 135–145)
Total Bilirubin: 0.6 mg/dL (ref 0.3–1.2)
Total Bilirubin: 0.7 mg/dL (ref 0.3–1.2)
Total Protein: 6.5 g/dL (ref 6.5–8.1)
Total Protein: 6.9 g/dL (ref 6.5–8.1)

## 2022-04-07 LAB — TSH: TSH: <0.01 u[IU]/mL — ABNORMAL LOW (ref 0.350–4.500)

## 2022-04-07 LAB — GLUCOSE, CAPILLARY
Glucose-Capillary: 86 mg/dL (ref 70–99)
Glucose-Capillary: 70 mg/dL (ref 70–99)
Glucose-Capillary: 119 mg/dL — ABNORMAL HIGH (ref 70–99)
Glucose-Capillary: 105 mg/dL — ABNORMAL HIGH (ref 70–99)

## 2022-04-07 LAB — LACTIC ACID, PLASMA
Lactic Acid, Venous: 6.9 mmol/L (ref 0.5–1.9)
Lactic Acid, Venous: 1.7 mmol/L (ref 0.5–1.9)
Lactic Acid, Venous: 2.3 mmol/L (ref 0.5–1.9)

## 2022-04-07 LAB — PHENYTOIN LEVEL, TOTAL: Phenytoin Lvl: 6.4 ug/mL — ABNORMAL LOW (ref 10.0–20.0)

## 2022-04-07 LAB — T4, FREE: Free T4: 0.99 ng/dL (ref 0.61–1.12)

## 2022-04-07 LAB — PHOSPHORUS
Phosphorus: 4.7 mg/dL — ABNORMAL HIGH (ref 2.5–4.6)
Phosphorus: 3.7 mg/dL (ref 2.5–4.6)

## 2022-04-07 LAB — MAGNESIUM: Magnesium: 2.1 mg/dL (ref 1.7–2.4)

## 2022-04-07 LAB — VALPROIC ACID LEVEL: Valproic Acid Lvl: 51 ug/mL (ref 50.0–100.0)

## 2022-04-07 LAB — MRSA NEXT GEN BY PCR, NASAL: MRSA by PCR Next Gen: NOT DETECTED

## 2022-04-07 LAB — BRAIN NATRIURETIC PEPTIDE: B Natriuretic Peptide: 2117.8 pg/mL — ABNORMAL HIGH (ref 0.0–100.0)

## 2022-04-07 MED ORDER — AMIODARONE HCL IN DEXTROSE 360-4.14 MG/200ML-% IV SOLN
30.0000 mg/h | INTRAVENOUS | Status: DC
  Administered 2022-04-07 – 2022-04-08 (×2): 30 mg/h via INTRAVENOUS
  Filled 2022-04-07: qty 200

## 2022-04-07 MED ORDER — PANTOPRAZOLE 2 MG/ML SUSPENSION: 40.0000 mg | Freq: Every day | ORAL | Status: DC

## 2022-04-07 MED ORDER — ACETAMINOPHEN 160 MG/5ML PO SOLN
650.0000 mg | ORAL | Status: DC
  Administered 2022-04-07 (×3): 650 mg
  Filled 2022-04-07 (×4): qty 20.3

## 2022-04-07 MED ORDER — AMIODARONE LOAD VIA INFUSION
150.0000 mg | Freq: Once | INTRAVENOUS | Status: AC
  Administered 2022-04-07: 150 mg via INTRAVENOUS
  Filled 2022-04-07: qty 83.34

## 2022-04-07 MED ORDER — CALCIUM CARBONATE ANTACID 1250 MG/5ML PO SUSP
3000.0000 mg | Freq: Three times a day (TID) | ORAL | Status: DC
  Filled 2022-04-07 (×2): qty 30

## 2022-04-07 MED ORDER — FENTANYL CITRATE PF 50 MCG/ML IJ SOSY
50.0000 ug | PREFILLED_SYRINGE | INTRAMUSCULAR | Status: DC | PRN
  Administered 2022-04-07: 100 ug via INTRAVENOUS
  Filled 2022-04-07: qty 2

## 2022-04-07 MED ORDER — PANTOPRAZOLE 2 MG/ML SUSPENSION
40.0000 mg | Freq: Every day | ORAL | Status: DC
  Administered 2022-04-07 – 2022-04-08 (×2): 40 mg
  Filled 2022-04-07 (×3): qty 20

## 2022-04-07 MED ORDER — PROPOFOL 1000 MG/100ML IV EMUL
0.0000 ug/kg/min | INTRAVENOUS | Status: DC
  Administered 2022-04-07: 25 ug/kg/min via INTRAVENOUS
  Filled 2022-04-07: qty 100

## 2022-04-07 MED ORDER — AMIODARONE IV BOLUS ONLY 150 MG/100ML
INTRAVENOUS | Status: AC
  Filled 2022-04-07: qty 100

## 2022-04-07 MED ORDER — BUSPIRONE HCL 5 MG PO TABS: 30.0000 mg | ORAL_TABLET | Freq: Three times a day (TID) | ORAL | Status: AC | PRN

## 2022-04-07 MED ORDER — ACETAMINOPHEN 650 MG RE SUPP: 650.0000 mg | RECTAL | Status: DC

## 2022-04-07 MED ORDER — ORAL CARE MOUTH RINSE
15.0000 mL | OROMUCOSAL | Status: DC
Start: 2022-04-07 — End: 2022-04-16
  Administered 2022-04-07 – 2022-04-16 (×101): 15 mL via OROMUCOSAL

## 2022-04-07 MED ORDER — AMIODARONE HCL IN DEXTROSE 360-4.14 MG/200ML-% IV SOLN
60.0000 mg/h | INTRAVENOUS | Status: DC
  Administered 2022-04-07 (×2): 60 mg/h via INTRAVENOUS
  Filled 2022-04-07: qty 200

## 2022-04-07 MED ORDER — AMIODARONE IV BOLUS ONLY 150 MG/100ML: 150.0000 mg | Freq: Once | INTRAVENOUS | Status: DC

## 2022-04-07 MED ORDER — CALCIUM CARBONATE ANTACID 1250 MG/5ML PO SUSP
2000.0000 mg | Freq: Three times a day (TID) | ORAL | Status: DC
  Administered 2022-04-07: 2000 mg
  Filled 2022-04-07: qty 20

## 2022-04-07 MED ORDER — SODIUM CHLORIDE 0.9 % IV SOLN
2.0000 g | INTRAVENOUS | Status: AC
  Administered 2022-04-07 – 2022-04-11 (×5): 2 g via INTRAVENOUS
  Filled 2022-04-07 (×5): qty 20

## 2022-04-07 MED ORDER — CLEVIDIPINE BUTYRATE 0.5 MG/ML IV EMUL
0.0000 mg/h | INTRAVENOUS | Status: DC
  Administered 2022-04-07: 1 mg/h via INTRAVENOUS
  Administered 2022-04-08: 4 mg/h via INTRAVENOUS
  Filled 2022-04-07 (×2): qty 100
  Filled 2022-04-07 (×2): qty 50

## 2022-04-07 MED ORDER — BUSPIRONE HCL 5 MG PO TABS
30.0000 mg | ORAL_TABLET | Freq: Three times a day (TID) | ORAL | Status: AC | PRN
Start: 2022-04-07 — End: 2022-04-09

## 2022-04-07 MED ORDER — ORAL CARE MOUTH RINSE: 15.0000 mL | OROMUCOSAL | Status: DC | PRN

## 2022-04-07 MED ORDER — SODIUM ZIRCONIUM CYCLOSILICATE 10 G PO PACK
10.0000 g | PACK | Freq: Three times a day (TID) | ORAL | Status: AC
  Administered 2022-04-07 (×2): 10 g
  Filled 2022-04-07 (×2): qty 1

## 2022-04-07 MED ORDER — CALCIUM CARBONATE ANTACID 1250 MG/5ML PO SUSP
3000.0000 mg | Freq: Three times a day (TID) | ORAL | Status: DC
  Administered 2022-04-07 (×2): 3000 mg
  Filled 2022-04-07 (×4): qty 30

## 2022-04-07 MED ORDER — PROPOFOL 1000 MG/100ML IV EMUL
5.0000 ug/kg/min | INTRAVENOUS | Status: DC
  Administered 2022-04-07 (×2): 60 ug/kg/min via INTRAVENOUS
  Administered 2022-04-08: 55 ug/kg/min via INTRAVENOUS
  Administered 2022-04-08: 45 ug/kg/min via INTRAVENOUS
  Administered 2022-04-08: 55 ug/kg/min via INTRAVENOUS
  Administered 2022-04-08: 60 ug/kg/min via INTRAVENOUS
  Administered 2022-04-09: 30 ug/kg/min via INTRAVENOUS
  Administered 2022-04-09: 15 ug/kg/min via INTRAVENOUS
  Filled 2022-04-07 (×8): qty 100

## 2022-04-07 MED ORDER — AMIODARONE IV BOLUS ONLY 150 MG/100ML
INTRAVENOUS | Status: AC
  Administered 2022-04-07: 150 mg
  Filled 2022-04-07: qty 100

## 2022-04-07 MED ORDER — PROSOURCE TF20 ENFIT COMPATIBL EN LIQD
60.0000 mL | Freq: Every day | ENTERAL | Status: DC
  Administered 2022-04-07 – 2022-04-16 (×10): 60 mL
  Filled 2022-04-07 (×10): qty 60

## 2022-04-07 MED ORDER — ADENOSINE 6 MG/2ML IV SOLN
INTRAVENOUS | Status: AC
  Filled 2022-04-07: qty 2

## 2022-04-07 MED ORDER — ACETAMINOPHEN 325 MG PO TABS
650.0000 mg | ORAL_TABLET | ORAL | Status: DC
  Filled 2022-04-07: qty 2

## 2022-04-07 MED ORDER — DOCUSATE SODIUM 50 MG/5ML PO LIQD
100.0000 mg | Freq: Two times a day (BID) | ORAL | Status: DC
  Administered 2022-04-09 – 2022-04-12 (×3): 100 mg
  Filled 2022-04-07 (×5): qty 10

## 2022-04-07 MED ORDER — AMIODARONE HCL IN DEXTROSE 360-4.14 MG/200ML-% IV SOLN
INTRAVENOUS | Status: AC
  Filled 2022-04-07: qty 200

## 2022-04-07 MED ORDER — LORAZEPAM 2 MG/ML IJ SOLN
INTRAMUSCULAR | Status: AC
  Administered 2022-04-07: 2 mg via INTRAVENOUS
  Filled 2022-04-07: qty 1

## 2022-04-07 MED ORDER — POLYETHYLENE GLYCOL 3350 17 G PO PACK
17.0000 g | PACK | Freq: Every day | ORAL | Status: DC
  Administered 2022-04-09 – 2022-04-12 (×3): 17 g
  Filled 2022-04-07 (×3): qty 1

## 2022-04-07 MED ORDER — LORAZEPAM 2 MG/ML IJ SOLN: 2.0000 mg | INTRAMUSCULAR | Status: AC

## 2022-04-07 MED ORDER — SODIUM CHLORIDE 0.9 % IV SOLN
4.0000 g | Freq: Four times a day (QID) | INTRAVENOUS | Status: AC
  Administered 2022-04-07 (×3): 4 g via INTRAVENOUS
  Filled 2022-04-07 (×5): qty 40

## 2022-04-07 MED ORDER — CHLORHEXIDINE GLUCONATE CLOTH 2 % EX PADS
6.0000 | MEDICATED_PAD | Freq: Every day | CUTANEOUS | Status: DC
  Administered 2022-04-08: 6 via TOPICAL

## 2022-04-07 MED ORDER — AMIODARONE LOAD VIA INFUSION
150.0000 mg | Freq: Once | INTRAVENOUS | Status: AC
  Filled 2022-04-07: qty 83.34

## 2022-04-07 MED ORDER — VITAL HIGH PROTEIN PO LIQD
1000.0000 mL | ORAL | Status: DC
  Administered 2022-04-07: 1000 mL

## 2022-04-07 MED ORDER — FENTANYL CITRATE PF 50 MCG/ML IJ SOSY: 50.0000 ug | PREFILLED_SYRINGE | INTRAMUSCULAR | Status: DC | PRN

## 2022-04-07 NOTE — Consult Note (Signed)
NAME:  Ruth Gutierrez, MRN:  [id number], DOB:  10/26/1965, LOS: 2 ADMISSION DATE:  04/15/2022, CONSULTATION DATE:  04/07/2022 REFERRING MD:  Philipp Ovens, CHIEF COMPLAINT:  Dyspnea   History of Present Illness:  56 year old woman who presented with altered mental status. Diagnosed with status epilepticus.  Started on AED's by neurology. Mental status has improved slowly.  Increased oxygen requirements today.   ESRD now on HS after failed renal transplant.  Due for HD today.   Pertinent  Medical History   Past Medical History:  Diagnosis Date   Anemia of chronic disease    Arthritis    Deceased-donor kidney transplant    Performed at [facility], April 2010.  Initial ESRD due to HTN nephropathy   Eczema    ESRD (end stage renal disease) ([place])    M/W/F dialysis   FUO (fever of unknown origin) 05/17/2015   GERD (gastroesophageal reflux disease)    Headache(784.0)    History of hyperparathyroidism    Hypertension    Peritonitis ([place]) 10/2019   Shortness of breath    Wears glasses    Past Surgical History:  Procedure Laterality Date   A/V FISTULAGRAM Left 02/12/2017   Procedure: A/V Fistulagram;  Surgeon: Algernon Huxley, MD;  Location: [place] [facility];  Service: Cardiovascular;  Laterality: Left;   A/V FISTULAGRAM Left 12/30/2017   Procedure: A/V FISTULAGRAM;  Surgeon: Algernon Huxley, MD;  Location: [place] [facility];  Service: Cardiovascular;  Laterality: Left;   A/V FISTULAGRAM Left 04/01/2021   Procedure: A/V FISTULAGRAM;  Surgeon: Algernon Huxley, MD;  Location: [place] [facility];  Service: Cardiovascular;  Laterality: Left;   A/V SHUNT INTERVENTION N/A 02/12/2017   Procedure: A/V Shunt Intervention;  Surgeon: Algernon Huxley, MD;  Location: [place] [facility];  Service: Cardiovascular;  Laterality: N/A;   AV FISTULA PLACEMENT     BASCILIC VEIN TRANSPOSITION Left 10/30/2014   Procedure: LEFT [place];  Surgeon: Rosetta Posner, MD;  Location: [place];   Service: Vascular;  Laterality: Left;   [place] Left 01/03/2015   Procedure: LEFT ARM 2ND STAGE [place];  Surgeon: Rosetta Posner, MD;  Location: [place];  Service: Vascular;  Laterality: Left;   BIOPSY  10/23/2021   Procedure: BIOPSY;  Surgeon: Jackquline Denmark, MD;  Location: [facility] ENDOSCOPY;  Service: Gastroenterology;;   BREAST BIOPSY Left    Patient doesn't remember any information from previous procedure.   CAPD REMOVAL N/A 10/31/2019   Procedure: PERITONEAL DIALYSIS  (CAPD) INFECTED CATHETER REMOVAL;  Surgeon: Coralie Keens, MD;  Location: [place];  Service: General;  Laterality: N/A;   CRANIOTOMY N/A 11/01/2020   Procedure: CRANIOTOMY FOR TUMOR EXCISION;  Surgeon: [name], MD;  Location: [place];  Service: Neurosurgery;  Laterality: N/A;   ESOPHAGOGASTRODUODENOSCOPY (EGD) WITH PROPOFOL N/A 10/23/2021   Procedure: ESOPHAGOGASTRODUODENOSCOPY (EGD) WITH PROPOFOL;  Surgeon: Jackquline Denmark, MD;  Location: [place];  Service: Gastroenterology;  Laterality: N/A;   FRACTURE SURGERY     left foot,baby toe nad next toe missing   INSERTION OF DIALYSIS CATHETER Right 10/30/2014   Procedure: INSERTION OF DIALYSIS CATHETER;  Surgeon: Rosetta Posner, MD;  Location: [place];  Service: Vascular;  Laterality: Right;   KIDNEY TRANSPLANT  11/02/2008   [facility])   PARATHYROIDECTOMY N/A 03/18/2022   Procedure: TOTAL PARATHYROIDECTOMY;  Surgeon: Armandina Gemma, MD;  Location: [place];  Service: General;  Laterality: N/A;   PLACEMENT OF LUMBAR  DRAIN N/A 11/01/2020   Procedure: PLACEMENT OF LUMBAR DRAIN;  Surgeon: [name], MD;  Location: [place];  Service: Neurosurgery;  Laterality: N/A;   [place] [facility] Events: Including procedures, antibiotic start and stop dates in addition to other pertinent events   9/1 - admitted with AMS found to have seizures on EEG 9/4 - increased in oxygen requirements   Interim History /  Subjective:  For HD today. Patient feels like she is volume overloaded.  Creatinine 12   Objective   Blood pressure (!) 166/96, pulse 93, temperature 99.7 F (37.6 C), temperature source Oral, resp. rate (!) 28, last menstrual period 06/02/2015, SpO2 96 %.       No intake or output data in the 24 hours ending 04/07/22 1014 There were no vitals filed for this visit.  Examination: General: Frail appearing woman in moderate distress.  HENT: no exophthalmos.  Lungs: diffuse rhonchi Cardiovascular: JVP to angle of jaw. HS distant  Abdomen: benign Extremities: no edema Neuro: awake and interactive, no tremor. No focality GU: anuric  Ancillary tests personally reviewed.   Hypocalcemia and elevated ALP, Vitamin D 18.3 TSH <0.01, T4 1.37, T3 3.1 CXR shows increased perihilar infiltrates consistent with volume overload.  Assessment & Plan:  Status epilepticus now controlled - Management per neurology.  Acute hypoxic respiratory insufficiency, likely due to volume overload related to ESRD - For HD today. - Will reassess if fails to improve with HD  Suppressed TSH - hyperthyroidism vs sick euthyroid syndrome.  Interpretation of thyroid function tests is difficult in the context of acute illness. Fully suppressed TSH unusual with SES, but T4 only mildly elevated and T3 is normal and patient has minimal symptoms of hyperthyroidism. May be mixed picture at this time.  - Will repeat thyroid function tests and if still show same or clearer pattern, will treat as hyperthyroidism but not storm.  - Hyperthyroidism not typically associated with hypocalcemia.   Kipp Brood, MD [facility] ICU Physician [place]  Pager: [phone] Or Epic Secure Chat After hours: [phone].  04/07/2022, 11:30 AM

## 2022-04-07 NOTE — Progress Notes (Signed)
Respiratory therapy note- Code blue called for patient on 5W. Upon arrival compressions and BMV with 100% in progress.Patient continued to be resuscitated and had vomited several times prior to intubation First attempt to intubate, cords visualized and initial end tidal Co2 confirmed then patient lost pulse and CPR resumed. Patient continued to vomit, unable to place gastric tube at this time.Anesthesia at the bedside at this time and re intubated patient with 7.5 and secured at 23. End tidal change and BBS equal, xray pending. Patient was transferred to ICU 104M and placed on ventilator. Report given to unit therapist.

## 2022-04-07 NOTE — Progress Notes (Signed)
eLink Physician-Brief Progress Note Patient Name: Ruth Gutierrez DOB: 23-Sep-1965 MRN: [id number]   Date of Service  04/07/2022  HPI/Events of Note  Patient had SVT/A fib earlier and was on IV amiodarone. Had HD done and has converted to SR with HR of 55-60. SBP 130s not on pressors. No distress on camera. Amiodarone was stopped.   eICU Interventions  Continue to watch off amiodarone for now. Clear sinus rhythm on monitor.      Intervention Category Major Interventions: Arrhythmia - evaluation and management  Margaretmary Lombard 04/07/2022, 9:36 PM

## 2022-04-07 NOTE — Progress Notes (Signed)
Patient converted to Afib RVR during dialysis. MD notified, amiodarone bolus started and gtt running per order. Patient HR continues to be tachycardic in the 140's-150's MD Agarwala notified. Orders for cleviprex placed but unable to obtain IV access. IV team deferred IV placement (see note). Ruth Gutierrez at bedside to place CVC. Amiodarone bolus given with provider at bedside. Ativan and fentanyl given for rhythmic twitching prior to line insertion. Propofol increased per provider.    BG 70 at 1600. MD notified. Tube feeds started per order.

## 2022-04-07 NOTE — Code Documentation (Signed)
  Patient Name: Ruth Gutierrez   MRN: [id number]   Date of Birth/ Sex: Jun 11, 1966 , female      Admission Date: 04/11/2022  Attending Provider: Kipp Brood, MD  Primary Diagnosis: Acute metabolic encephalopathy   Indication: Pt was in her usual state of health until this 11:49 AM, when she was noted to be unresponsive and in asystole. Code blue was subsequently called. At the time of arrival on scene, ACLS protocol was underway.   Technical Description:  - CPR performance duration:  19 minutes  - Was defibrillation or cardioversion used? No   - Was external pacer placed? No  - Was patient intubated pre/post CPR? Yes   Medications Administered: Y = Yes; Blank = No Amiodarone    Atropine    Calcium  X  Epinephrine  X  Lidocaine    Magnesium    Norepinephrine    Phenylephrine    Sodium bicarbonate    Vasopressin     Post CPR evaluation:  - Final Status - Was patient successfully resuscitated ? Yes - What is current rhythm? Sinus brady - What is current hemodynamic status? Stable   Miscellaneous Information:  - Labs sent, including: CBC, CMP, Lactic, EKG, CXR  - Primary team notified?  Yes  - Family Notified? Yes  - Additional notes/ transfer status: PCCM came to bedside, and patient was transferred to the ICU (25M-11). Patient initially seized prior to code.      Leigh Aurora, DO  04/07/2022, 12:30 PM

## 2022-04-07 NOTE — Procedures (Addendum)
Patient Name: Ruth Gutierrez  MRN: [id number]  Epilepsy Attending: Lora Havens  Referring Physician/Provider: Lora Havens, MD Duration: 04/07/2022 0221 to 04/08/2022 0221   Patient history: 56 y.o. female with PMH significant forESRD on HD, headaches, history of meningioma with bifrontal severe vasogenic edema s/p resection in April 2022, HTN who became confused and lethargic during HD and was sent to the ED. EEG to evaluate for seizure   Level of alertness:  lethargic    AEDs during EEG study: LEV, PHT, LCM, propofol   Technical aspects: This EEG study was done with scalp electrodes positioned according to the 10-20 International system of electrode placement. Electrical activity was reviewed with band pass filter of 1-'70Hz'$ , sensitivity of 7 uV/mm, display speed of 88m/sec with a '60Hz'$  notched filter applied as appropriate. EEG data were recorded continuously and digitally stored.  Video monitoring was available and reviewed as appropriate.   Description: EEG showed continuous generalized 3 to 6 Hz theta-delta slowing. Lateralized rhythmic delta activity was also noted in right hemisphere, maximal right frontal region.    One seizure was noted on 04/07/2022 at 0455 during which no clinical signs were seen. EEG showed 15-'18Hz'$  beta activity in right frontal region which evolved to 3-'5hz'$  theta-delta slowing and involved right hemisphere consistent with seizure.  Seizure lasted for about 2 minutes. Patient pulled her EEG leads at around 6:00 on 04/07/2022.  Leads were fixed again at around 1030 on 04/07/2022. Therefore study was not available for review between 6 am to 1030 am on 04/07/2022.   At 1141, patient was laying in bed , RN and family were repositioning patient.  Suddenly, patient became less responsive.  Concomitant EEG showed low amplitude 2 to 3 Hz delta slowing in right hemisphere and vertex region which within few seconds appeared generalized and evolved into generalized attenuation.   This was followed by asystole.  Patient received chest compressions and ROSC was achieved.  Patient was then started on propofol.  EEG subsequently showed burst suppression pattern with bursts of 3 to 5 Hz sharply contoured theta-delta slowing lasting 1-3 seconds alternating with 5 to 8 seconds of generalized EEG suppression.    Event button was pressed on 04/07/2022 at 1727 during which patient was noted to have whole-body twitching movements.  Concomitant EEG before, during and after the event did not show any EEG change to suggest seizure.  ABNORMALITY - Seizure without clinical signs, right frontal region - Lateralized rhythmic delta activity, right hemisphere, maximal right frontal region - Continuous slow, generalized    IMPRESSION: This study showed one seizure without clinical signs on 04/07/2022 at 0455 arising from right frontal region, lasting about 2 minutes. Additionally there was cortical dysfunction in right hemisphere, maximal right frontal region likely secondary to underlying structural abnormality.  Lastly there was moderate to severe diffuse encephalopathy, nonspecific etiology but likely related to seizure.   At 1141, patient was laying in bed and suddenly became less responsive.  Concomitant EEG showed cortical dysfunction arising from right frontal region.  Patient then went into asystole and chest compressions were performed.  ROSC was eventually achieved.  Subsequently, EEG was suggestive of profound diffuse encephalopathy likely due to sedation.  Event button was pressed on 04/07/2022 at 1727 for whole body twitching without concomitant EEG change.  This was most likely not an epileptic event.  Chrishonda Hesch OBarbra Sarks

## 2022-04-07 NOTE — Final Progress Note (Signed)
Critical Care Progress Note:    Admitted to ICU following cardiac arrest due to aspiration caused by seizure.  Now critically ill due to acute hypoxic respiratory failure requiring mechanical ventilation.  She remains hypertensive.  She will undergo IHD.  No visible seizure activity.  Minimally responsive.  Slow background EEG.  Continue current anticonvulsants and maintain on propofol for seizure suppression until tomorrow.  SBT this afternoon.  Started on amiodarone infusion  CRITICAL CARE Performed by: Kipp Brood   Total critical care time: 35 minutes  Critical care time was exclusive of separately billable procedures and treating other patients.  Critical care was necessary to treat or prevent imminent or life-threatening deterioration.  Critical care was time spent personally by me on the following activities: development of treatment plan with patient and/or surrogate as well as nursing, discussions with consultants, evaluation of patient's response to treatment, examination of patient, obtaining history from patient or surrogate, ordering and performing treatments and interventions, ordering and review of laboratory studies, ordering and review of radiographic studies, pulse oximetry, re-evaluation of patient's condition and participation in multidisciplinary rounds.  Kipp Brood, MD [facility] ICU Physician [place]  Pager: [phone] Mobile: [phone] After hours: [phone].

## 2022-04-07 NOTE — Progress Notes (Signed)
Transition of Care Department [facility]) following patient for high risk of readmission.   Patient admitted for Altered mental status And Status epilepticus. History of history of ESRD post transplant on dialysis. Patient currently still confused.   Transition of Care Department [facility]) has reviewed patient and we will continue to monitor patient advancement through interdisciplinary progression rounds.

## 2022-04-07 NOTE — Progress Notes (Addendum)
Subjective:   Febrile, hypertensive, and tachypneic this morning. Patient oriented to self, time, and place but not situation. Appears uncomfortable. Complaining of difficulty breathing. Patient wants to remove her EEG leads.   Objective:  Vital signs in last 24 hours: Vitals:   04/06/22 1913 04/06/22 2333 04/07/22 0151 04/07/22 0439  BP: (!) 163/85 (!) 161/94  (!) 166/96  Pulse: 90 90  92  Resp: 20 20  (!) 30  Temp: 99.5 F (37.5 C) 99.9 F (37.7 C)  (!) 100.5 F (38.1 C)  TempSrc: Axillary Axillary  Oral  SpO2: 97% 98% 97% 94%   Physical Exam: General: somnolent, restless lying in bed HENT:   normocephalic and atraumatic, PERRL Pulmonary:  increased respiratory effort, transmitted lung sounds throughout obscuring pulmonary exam Cardiac:   regular rate and rhythm, holosystolic murmur on left sternal border, no rubs or gallops, no LE edema Abdomen:  soft and non-distended, normoactive bowel sounds  Skin:   warm and dry  Neurologic:  Awake and alert to self, place, and time but not situation. Inconsistently answering questions, delayed responses, following commands to sit up  Assessment/Plan:  Principal Problem:   Acute metabolic encephalopathy Active Problems:   Hypertension   ESRD on hemodialysis ([place])   Status post resection of meningioma   Hypocalcemia   H/O parathyroidectomy ([name])   Status epilepticus ([facility])  Ruth Gutierrez is a 56 y/o female with history of ESRD s/p renal transplant on HD MWF, HTN, s/p parathyroidectomy, and depression that presented w/ AMS and was admitted for status epilepticus.    # Altered mental status  # Status epilepticus - resolved # Right frontal seizures HD session on 9/1 ended prematurely due to patients lethargy and confusion. EEG initially with status epilepticus resolved around 1230 on 9/2. Now with seizures arising from right frontal region with severe diffuse encephalopathy. Neurology concerned that hypocalcemia is driving her  seizures. Patients mental status remains altered today.  - Continue fosphenytoin, keppra, lacosamide  - NG placed for calcium supplementation - Replace calcium as stated below - Continuous EEG - Seizure precautions - PT/OT consult once mental status improves   2. # Severe symptomatic hypocalcemia # Parathyroidectomy 03/2022 Patient was hospitalized in 03/2022 for hypocalcemia secondary to recent parathyroidectomy. Corrected calcium still low today at 7.5 but improved from yesterday. AMS has been limiting calcium administration. Nephro ordered NG tube. Speech path recommends dysphagia 2 diet.  - IV calcium gluconate 2g push yesterday morning in addition to 4 g over 6 hours - Calcium carbonate 1,000 mg TID per tube - Calcitriol 38mg BID per tube - Repeat calcium   3. # Acute Hypoxic Respiratory Failure Febrile this morning. CXR demonstrating pulmonary edema w/ pleural effusions and central pulmonary vascular congestion this morning. WBC count WNL. Like secondary to fluid overload. Currently satting well on 5L HFNC. Nephro consulted and will see the patient. Nephro will move patients dialysis session up today.  PCCM consulted and will reassess the patient if failure to improve following HD. - Reassess breathing and oxygen requirements - Reassess if patient should be sent to the ICU  4. # ESRD on HD MWF # Hyperkalemia HD on MWF. Most recent HD prior to hospitalization ended prematurely. Potassium elevated today at 5.2. Like secondary to ESRD. Nephro consulted.  - HD MWF - Avoid nephrotoxins  - Trend BMP   5. # Hypertension BP remains elevated this morning. Amlodipine '10mg'$  and carvedilol '25mg'$  q12 at home.  - Amlodipine 10 mg per tube - Continue to  monitor BP   6. # Hyperthyroidism TSH decreased and free T4 elevated. Trab negative. Potentially related to patients fever, hypertension, and tachypnea this morning. Concern for potential thyrotoxicosis. PCCM consulted. PCCM not concerned for  thyroid storm at this time, potentially hyperthyroidism vs sick euthyroid syndrome, recommends repeating thyroid function tests.  - Needs RAIU scan once stable   Diet: Dysphagia 2 VTE: Heparin IVF: None Code: Full   Prior to Admission Living Arrangement: home w/ daughter Anticipated Discharge Location: pending Barriers to Discharge: continued managment Dispo: Anticipated discharge in approximately more than 2 day(s).   Starlyn Skeans, MD 04/07/2022, 5:39 AM Pager: [phone] After 5pm on weekdays and 1pm on weekends: On Call pager [phone]

## 2022-04-07 NOTE — Progress Notes (Signed)
Called to assess patient for increased WOB/RR. Pt was on [place] increased to 8L. Placed on HFNC at 6L, nebulizer tx given. RN at bedside. MD paged.

## 2022-04-07 NOTE — Progress Notes (Signed)
EEG LTM had to be re-hooked. Patient tore all wires off. Patient on non MRI leads. Patient being monitored but atrium. Test button tested.

## 2022-04-07 NOTE — Progress Notes (Signed)
This nurse entered room and pt was sitting on side of bed with daughter at bedside. Pt expressed wanting to sit up, this nurse placed pt back into bed and attempted to placed bed in chair position. During this time pt started to thrash around and exhibit continued restlessness. Pt was then laid flat to sit in bed more upright, while lying flat pt was suctioned due to visible secretions in oral cavity. She then began biting tongue and arms left arm became rigid, eye became wide and pt was not blinking. This nurse called charge nurse and explained what happened. Attempted to replaced telemetry leads on pt to assess cardiac monitoring and agonal breathing noted and no pulse was present, and chest compressions were started. Code blue was called and interdisciplinary team arrived to room.

## 2022-04-07 NOTE — Procedures (Signed)
Central Venous Catheter Insertion Procedure Note  PROMISS LABARBERA  [id number]  21-Apr-1966  Date:04/07/22  Time:6:25 PM   Provider Performing:Brooke Moshe Cipro   Procedure: Insertion of Non-tunneled Central Venous [phone]) with US guidance ([id number])   Indication(s) Medication administration and Difficult access  Consent Risks of the procedure as well as the alternatives and risks of each were explained to the patient and/or caregiver.  Consent for the procedure was obtained and is signed in the bedside chart  Anesthesia Topical only with 1% lidocaine   Timeout Verified patient identification, verified procedure, site/side was marked, verified correct patient position, special equipment/implants available, medications/allergies/relevant history reviewed, required imaging and test results available.  Sterile Technique Maximal sterile technique including full sterile barrier drape, hand hygiene, sterile gown, sterile gloves, mask, hair covering, sterile ultrasound probe cover (if used).  Procedure Description Area of catheter insertion was cleaned with chlorhexidine and draped in sterile fashion.  With real-time ultrasound guidance a central venous catheter was placed into the right femoral vein. Nonpulsatile blood flow and easy flushing noted in all ports.  The catheter was sutured in place and sterile dressing applied.     Complications/Tolerance None; patient tolerated the procedure well. Chest X-ray is ordered to verify placement for internal jugular or subclavian cannulation.   Chest x-ray is not ordered for femoral cannulation.  EBL Minimal  Specimen(s) None     Kennieth Rad, MSN, AG-ACNP-BC [facility] 04/07/2022, 6:26 PM  See Amion for pager If no response to pager, please call PCCM consult pager After 7:00 pm call Elink

## 2022-04-07 NOTE — Progress Notes (Signed)
[facility] Progress Note  Subjective: pt seen in room, confused but alert. SOB earlier and CXR done which shows new pulm edema.   Vitals:   04/07/22 0151 04/07/22 0439 04/07/22 0618 04/07/22 0900  BP:  (!) 166/96    Pulse:  92 94 93  Resp:  (!) 30  (!) 28  Temp:  (!) 100.5 F (38.1 C) (!) 100.6 F (38.1 C) 99.7 F (37.6 C)  TempSrc:  Oral Oral Oral  SpO2: 97% 94% 96%     Exam: Confused but awake, [place] O2, not in distress +jvd Chest bilat rales 1/3 up Cor reg no RG Abd soft ntnd Ext trace LE edema  LUE AVF+bruit   [facility]: [facility]  4h  65kg  2/3 bath  LUE AVF  Hep none - hectorol 4 u g tiw - venofer '100mg'$   - mircera 225 q 2, last 8/30    Summary: 56 y.o. female ESRD, h/o transplant, PTX, HTN, depression p/w AMS.  Rx only 1hr49mn of HD on 9/1 bec of lethargy and confusion  noted to be somnolent and initially thought that Ativan was contributing. Pt has been confused for 1-2 days; recently had a parathyroidecomy on 8/15 and was treated for hypocalcemia subsequently. Her regimen was supposed to be  liquid CaCO3 '2400mg'$  suspension 4x daily but according to the mother she did not tolerate this in the hospital last admission and it was confirmed with mother and daughter (lives w/ daughter) she was not taking any Ca at home.  Ca noted to be 5.5 in the ED with a pCO2 of 18. Patient had facial twitching in the ED with subsequent MRI of the brain showed gyriform diffusion restriction of the anterior right frontal lob suggestive of acute seizure/status epilepticus. EEG confirmed that it was coming from the right frontal lobe. Pt admitted, we were asked to see for ESRD.    Assessment/ Plan: Hypocalcemia/ hungry bone syndrome sp recent total parathyroidectomy 8/15 - pt was dc'd 8/25 after 10 days in hospital. She was dc'd on CaCO3 2400 elemental qid, but per the family she was NOT taking any calcium at home. Also high-dose calcitriol 2 ug bid which we will continue here. She has  been on a higher Ca bath at outpt center but in hospital we do not have high Ca bath. Pt here now w/ seizures and hypocalcemia. We restarted her po CaCO3 here but she was not taking po reliably so NG tube was placed (which will need to remain in place until Ca++ stabilized). Will continue tid IV Ca gluc boluses (2- 4 gm) until corr Ca > 8- 8.5. Summary:  - increased CaCO3 to '3000mg'$  elemental tid between meals - cont calcitriol 2 ug bid - ordered IV Ca gluconate 4 gm q 6h x 3 today - q 6h Ca++ levels x 3 today - no high Ca++ bath available here, but will need at dc  - maintain NG tube until can reliably swallow pills/ liquids  ESRD - cont HD MWF. HD today.  Vol overload - w/ acute resp failure, looks stable now and CXR does show sig pulm edema which is new. Max UF w/ HD today.  HTN - BP's normal to high here Seizures - seen by neuro -> arising from right frontal region + severe diffuse encephalopathy.  Anemia esrd -Hgb 8.5, just received mircera 2230m on 8/30, next due 9/13.  Secondary hyperparathyroidism -see as above hypocalcemia with that plan. Will add on phos. Binders are on hold  post PTX.  History of meningioma - status post resection 11/01/2020     Kelly Splinter 04/07/2022, 11:14 AM   Recent Labs  Lab 04/05/22 0219 04/05/22 0926 04/06/22 0359 04/06/22 1751 04/07/22 0318  HGB 8.5*  --  7.8*  --  8.6*  ALBUMIN 2.7*  --   --   --  2.6*  CALCIUM 5.7*   < > 5.4* 5.7* 6.4*  CREATININE 8.96*  --  10.69*  --  12.06*  K 4.0  --  4.3  --  5.2*   < > = values in this interval not displayed.   No results for input(s): "IRON", "TIBC", "FERRITIN" in the last 168 hours. Inpatient medications:  amLODipine  10 mg Oral Daily   calcitRIOL  2 mcg Oral BID BM   calcium carbonate (dosed in mg elemental calcium)  2,000 mg of elemental calcium Per Tube TID   Chlorhexidine Gluconate Cloth  6 each Topical Q0600   heparin  5,000 Units Subcutaneous Q8H   levETIRAcetam  1,000 mg Oral Daily    levETIRAcetam  500 mg Oral Once per day on Mon Wed Fri   phenytoin (DILANTIN) IV  100 mg Intravenous Q8H    calcium gluconate 4 g (04/07/22 1041)   lacosamide (VIMPAT) IV 150 mg (04/07/22 1005)   levETIRAcetam 1,000 mg (04/06/22 1147)   levETIRAcetam     albuterol, diclofenac Sodium

## 2022-04-07 NOTE — Progress Notes (Signed)
Pt developed afib / RVR about midway through dialysis and HD was cut short. 1.5 L UF. Will plan for HD again tomorrow and hopefully can get more volume off.   Kelly Splinter, MD 04/07/2022, 4:24 PM

## 2022-04-07 NOTE — Progress Notes (Signed)
Tx D/c due to elevated HR and BP.

## 2022-04-07 NOTE — Progress Notes (Signed)
Subjective: Per daughter at bedside, no new concerns.  ROS: Unable to obtain due to poor mental status  Examination  Vital signs in last 24 hours: Temp:  [98.2 F (36.8 C)-100.6 F (38.1 C)] 99.7 F (37.6 C) (09/04 0900) Pulse Rate:  [87-94] 93 (09/04 0900) Resp:  [19-30] 28 (09/04 0900) BP: (161-169)/(85-96) 166/96 (09/04 0439) SpO2:  [94 %-98 %] 96 % (09/04 0618)  General: lying in bed, NAD Neuro: Awake, alert, oriented to person, not to place or time, able to do simple math arithmetic but decreased attention span, at times perseverating, able to follow simple one-step commands, able to name objects but not consistently, PERRLA, EOMI, no apparent facial asymmetry, continuously moving all 4 extremities with antigravity strength.   Basic Metabolic Panel: Recent Labs  Lab 04/28/2022 2019 04/07/2022 2346 04/05/22 0219 04/05/22 0926 04/06/22 0359 04/06/22 1751 04/07/22 0318  NA 137 136 138  --  138  --  139  K 4.1 3.9 4.0  --  4.3  --  5.2*  CL 96*  --  98  --  98  --  96*  CO2 26  --  27  --  23  --  23  GLUCOSE 161*  --  111*  --  74  --  89  BUN 31*  --  33*  --  39*  --  47*  CREATININE 8.18*  --  8.96*  --  10.69*  --  12.06*  CALCIUM 5.5*  --  5.7*   < > 5.4* 5.7* 6.4*  MG 2.4  --   --   --  2.4  --   --    < > = values in this interval not displayed.    CBC: Recent Labs  Lab 04/05/2022 2019 04/28/2022 2346 04/05/22 0219 04/06/22 0359 04/07/22 0318  WBC 10.5  --  9.6 7.4 9.8  HGB 9.1* 8.8* 8.5* 7.8* 8.6*  HCT 28.3* 26.0* 25.7* 23.9* 26.7*  MCV 89.3  --  88.3 89.5 90.2  PLT 217  --  180 172 179     Coagulation Studies: Recent Labs    04/05/22 0219  LABPROT 18.3*  INR 1.5*    Imaging MRI brain without contrast 04/23/2022: 1. Motion degraded exam. 2. Postoperative changes from previous bifrontal craniotomy for meningioma resection. Underlying encephalomalacia and gliosis throughout the subjacent anterior/inferior frontal lobes bilaterally. No visible locally  recurrent tumor or mass on this noncontrast motion degraded exam. 3. Gyriform restricted diffusion involving the anterior right frontal lobe, immediately adjacent to the chronic postsurgical changes, favored to reflect changes of acute seizure/status epilepticus. Correlation with EEG recommended. 4. Otherwise normal brain MRI for age.   ASSESSMENT AND PLAN: 21 old female with status epilepticus arising from right frontal region in the setting of severe hypercalcemia and prior meningioma resection.  Refractory electrographic/nonconvulsive status epilepticus, resolved Acute encephalopathy -Frequency of seizures improving overnight  Recommendations -Continue current dose of Keppra, Vimpat and phenytoin -We will plan to check phenytoin levels in a couple of days if seizures remain controlled -If any further seizures, will consider adding Onfi or Perampanel -Continue LTM EEG overnight. -Management of rest of comorbidities per primary team -Discussed plan with daughter at bedside  I have spent a total of  38  minutes with the patient reviewing hospital notes,  test results, labs and examining the patient as well as establishing an assessment and plan.  > 50% of time was spent in direct patient care.  Zeb Comfort Epilepsy [facility] For  questions after 5pm please refer to AMION to reach the Neurologist on call

## 2022-04-07 NOTE — Progress Notes (Signed)
Subjective: Messaged via secure chat that the patient had a seizure.  Went to reassess the patient.  Upon arriving, the patient was receiving chest compressions after coding. PCCM was present. Family was present.  Plan: Updated neurology on patient presentation. PCCM transferring the patient to the ICU.  Starlyn Skeans, MD 04/07/2022, 5:39 AM Pager: [phone] After 5pm on weekdays and 1pm on weekends: On Call pager [phone]

## 2022-04-07 NOTE — Progress Notes (Signed)
Dear Doctor: This patient has been identified as a candidate for CVC for the following reason (s): IV therapy over 48 hours, drug pH or osmolality (causing phlebitis, infiltration in 24 hours), drug extravasation potential with tissue necrosis (KCL, Dilantin, Dopamine, CaCl, MgSO4, chemo vesicant), poor veins/poor circulatory system (CHF, COPD, emphysema, diabetes, steroid use, IV drug abuse, etc.), restarts due to phlebitis and infiltration in 24 hours, and incompatible drugs (aminophyllin, TPN, heparin, given with an antibiotic) If you agree, please write an order for the indicated device. Thank you for supporting the early vascular access assessment program.

## 2022-04-07 NOTE — Progress Notes (Signed)
Tx initiated. No distress noted. Goal set for 3.5L. VSS. Fistula accessed with no problems.

## 2022-04-07 NOTE — Progress Notes (Signed)
Patient with increased RR/WOB and oxygen demand, called respiratory to assess at bedside. Placed on 6L HFNC and breathing treatment given. MD notified, no new orders given at this time.

## 2022-04-07 NOTE — Progress Notes (Addendum)
Consult received for PIV placement. VAST assessed for PIV placement. At this time no appropriate vein found at this time. Recommended CVC placement d/t multiple incompatible medications. Not appropriate for PICC line/midline d/t renal function. Notified nurse. Fran Lowes, RN VAST

## 2022-04-07 NOTE — Progress Notes (Addendum)
[facility] HD treatment ended early d/t increased HR and BP. HR in 170-190's. 1.5L fluid removed. Bedside nurse and provider aware. VSS as of now.   Treatment completed: 1600  Pt without acute distress.  Hand-off given to patient's nurse.   Access used: Fistula; no complications Access issues: none  Total UF removed: 1.5L Medication(s) given: none  Post HD weight: 71.2kg bed weight   Ruth Gutierrez

## 2022-04-08 ENCOUNTER — Other Ambulatory Visit (HOSPITAL_COMMUNITY): Payer: Medicare Other

## 2022-04-08 ENCOUNTER — Inpatient Hospital Stay (HOSPITAL_COMMUNITY): Payer: Medicare Other

## 2022-04-08 DIAGNOSIS — R4182 Altered mental status, unspecified: Secondary | ICD-10-CM | POA: Diagnosis not present

## 2022-04-08 DIAGNOSIS — G40901 Epilepsy, unspecified, not intractable, with status epilepticus: Secondary | ICD-10-CM | POA: Diagnosis not present

## 2022-04-08 DIAGNOSIS — G9341 Metabolic encephalopathy: Secondary | ICD-10-CM | POA: Diagnosis not present

## 2022-04-08 LAB — CALCIUM
Calcium: 7.7 mg/dL — ABNORMAL LOW (ref 8.9–10.3)
Calcium: 7.1 mg/dL — ABNORMAL LOW (ref 8.9–10.3)

## 2022-04-08 LAB — POCT I-STAT 7, (LYTES, BLD GAS, ICA,H+H)
Acid-Base Excess: 7 mmol/L — ABNORMAL HIGH (ref 0.0–2.0)
Acid-Base Excess: 8 mmol/L — ABNORMAL HIGH (ref 0.0–2.0)
Bicarbonate: 29.8 mmol/L — ABNORMAL HIGH (ref 20.0–28.0)
Bicarbonate: 31.6 mmol/L — ABNORMAL HIGH (ref 20.0–28.0)
Calcium, Ion: 0.97 mmol/L — ABNORMAL LOW (ref 1.15–1.40)
Calcium, Ion: 0.96 mmol/L — ABNORMAL LOW (ref 1.15–1.40)
HCT: 26 % — ABNORMAL LOW (ref 36.0–46.0)
HCT: 24 % — ABNORMAL LOW (ref 36.0–46.0)
Hemoglobin: 8.8 g/dL — ABNORMAL LOW (ref 12.0–15.0)
Hemoglobin: 8.2 g/dL — ABNORMAL LOW (ref 12.0–15.0)
O2 Saturation: 97 %
O2 Saturation: 90 %
Patient temperature: 98
Patient temperature: 98
Potassium: 3.1 mmol/L — ABNORMAL LOW (ref 3.5–5.1)
Potassium: 3.4 mmol/L — ABNORMAL LOW (ref 3.5–5.1)
Sodium: 134 mmol/L — ABNORMAL LOW (ref 135–145)
Sodium: 134 mmol/L — ABNORMAL LOW (ref 135–145)
TCO2: 31 mmol/L (ref 22–32)
TCO2: 33 mmol/L — ABNORMAL HIGH (ref 22–32)
pCO2 arterial: 32.6 mmHg (ref 32–48)
pCO2 arterial: 40.9 mmHg (ref 32–48)
pH, Arterial: 7.568 — ABNORMAL HIGH (ref 7.35–7.45)
pH, Arterial: 7.494 — ABNORMAL HIGH (ref 7.35–7.45)
pO2, Arterial: 73 mmHg — ABNORMAL LOW (ref 83–108)
pO2, Arterial: 52 mmHg — ABNORMAL LOW (ref 83–108)

## 2022-04-08 LAB — RENAL FUNCTION PANEL
Albumin: 2.1 g/dL — ABNORMAL LOW (ref 3.5–5.0)
Anion gap: 16 — ABNORMAL HIGH (ref 5–15)
BUN: 36 mg/dL — ABNORMAL HIGH (ref 6–20)
CO2: 25 mmol/L (ref 22–32)
Calcium: 7.8 mg/dL — ABNORMAL LOW (ref 8.9–10.3)
Chloride: 93 mmol/L — ABNORMAL LOW (ref 98–111)
Creatinine, Ser: 8.77 mg/dL — ABNORMAL HIGH (ref 0.44–1.00)
GFR, Estimated: 5 mL/min — ABNORMAL LOW (ref >60)
Glucose, Bld: 154 mg/dL — ABNORMAL HIGH (ref 70–99)
Phosphorus: 3.9 mg/dL (ref 2.5–4.6)
Potassium: 4.8 mmol/L (ref 3.5–5.1)
Sodium: 134 mmol/L — ABNORMAL LOW (ref 135–145)

## 2022-04-08 LAB — CBC
HCT: 25.5 % — ABNORMAL LOW (ref 36.0–46.0)
Hemoglobin: 8.1 g/dL — ABNORMAL LOW (ref 12.0–15.0)
MCH: 29.2 pg (ref 26.0–34.0)
MCHC: 31.8 g/dL (ref 30.0–36.0)
MCV: 92.1 fL (ref 80.0–100.0)
Platelets: 151 10*3/uL (ref 150–400)
RBC: 2.77 MIL/uL — ABNORMAL LOW (ref 3.87–5.11)
RDW: 19 % — ABNORMAL HIGH (ref 11.5–15.5)
WBC: 10.9 10*3/uL — ABNORMAL HIGH (ref 4.0–10.5)
nRBC: 0 % (ref 0.0–0.2)

## 2022-04-08 LAB — GLUCOSE, CAPILLARY
Glucose-Capillary: 102 mg/dL — ABNORMAL HIGH (ref 70–99)
Glucose-Capillary: 107 mg/dL — ABNORMAL HIGH (ref 70–99)
Glucose-Capillary: 116 mg/dL — ABNORMAL HIGH (ref 70–99)
Glucose-Capillary: 122 mg/dL — ABNORMAL HIGH (ref 70–99)
Glucose-Capillary: 37 mg/dL — CL (ref 70–99)
Glucose-Capillary: 82 mg/dL (ref 70–99)
Glucose-Capillary: 87 mg/dL (ref 70–99)

## 2022-04-08 LAB — HEPARIN LEVEL (UNFRACTIONATED): Heparin Unfractionated: 0.12 IU/mL — ABNORMAL LOW (ref 0.30–0.70)

## 2022-04-08 LAB — CALCIUM, IONIZED
Calcium, Ionized, Serum: <3 mg/dL — ABNORMAL LOW (ref 4.5–5.6)
Calcium, Ionized, Serum: 3.4 mg/dL — ABNORMAL LOW (ref 4.5–5.6)

## 2022-04-08 LAB — MAGNESIUM
Magnesium: 2 mg/dL (ref 1.7–2.4)
Magnesium: 1.8 mg/dL (ref 1.7–2.4)

## 2022-04-08 LAB — TRIGLYCERIDES: Triglycerides: 103 mg/dL (ref <150)

## 2022-04-08 LAB — PHOSPHORUS: Phosphorus: 2.6 mg/dL (ref 2.5–4.6)

## 2022-04-08 MED ORDER — CLOBAZAM 2.5 MG/ML PO SUSP
5.0000 mg | Freq: Every day | ORAL | Status: DC
Start: 2022-04-08 — End: 2022-04-14
  Administered 2022-04-08 – 2022-04-13 (×6): 5 mg
  Filled 2022-04-08 (×6): qty 4

## 2022-04-08 MED ORDER — LEVETIRACETAM IN NACL 500 MG/100ML IV SOLN
500.0000 mg | INTRAVENOUS | Status: DC
  Administered 2022-04-09: 500 mg via INTRAVENOUS
  Filled 2022-04-08: qty 100

## 2022-04-08 MED ORDER — ACETAMINOPHEN 160 MG/5ML PO SOLN
650.0000 mg | ORAL | Status: DC
  Filled 2022-04-08: qty 20.3

## 2022-04-08 MED ORDER — FENTANYL BOLUS VIA INFUSION
50.0000 ug | INTRAVENOUS | Status: DC | PRN
  Administered 2022-04-09 (×5): 50 ug via INTRAVENOUS
  Administered 2022-04-10: 100 ug via INTRAVENOUS

## 2022-04-08 MED ORDER — ACETAMINOPHEN 650 MG RE SUPP: 650.0000 mg | RECTAL | Status: DC

## 2022-04-08 MED ORDER — ACETAMINOPHEN 325 MG PO TABS
650.0000 mg | ORAL_TABLET | ORAL | Status: DC
  Administered 2022-04-08 – 2022-04-14 (×14): 650 mg
  Filled 2022-04-08 (×14): qty 2

## 2022-04-08 MED ORDER — LEVETIRACETAM 500 MG PO TABS
1000.0000 mg | ORAL_TABLET | Freq: Every day | ORAL | Status: DC
  Administered 2022-04-09 – 2022-04-16 (×6): 1000 mg
  Filled 2022-04-08 (×8): qty 2

## 2022-04-08 MED ORDER — LEVETIRACETAM IN NACL 500 MG/100ML IV SOLN: 500.0000 mg | INTRAVENOUS | Status: DC

## 2022-04-08 MED ORDER — DEXTROSE 50 % IV SOLN
INTRAVENOUS | Status: AC
  Filled 2022-04-08: qty 50

## 2022-04-08 MED ORDER — FENTANYL 2500MCG IN NS 250ML (10MCG/ML) PREMIX INFUSION
50.0000 ug/h | INTRAVENOUS | Status: DC
  Administered 2022-04-08: 50 ug/h via INTRAVENOUS
  Administered 2022-04-09: 100 ug/h via INTRAVENOUS
  Administered 2022-04-10 – 2022-04-11 (×3): 200 ug/h via INTRAVENOUS
  Filled 2022-04-08 (×5): qty 250

## 2022-04-08 MED ORDER — LEVETIRACETAM 500 MG PO TABS
500.0000 mg | ORAL_TABLET | ORAL | Status: DC
  Administered 2022-04-12: 500 mg
  Filled 2022-04-08 (×2): qty 1

## 2022-04-08 MED ORDER — CALCIUM GLUCONATE-NACL 1-0.675 GM/50ML-% IV SOLN
1.0000 g | Freq: Once | INTRAVENOUS | Status: AC
Start: 2022-04-08 — End: 2022-04-09
  Administered 2022-04-08: 1000 mg via INTRAVENOUS
  Filled 2022-04-08: qty 50

## 2022-04-08 MED ORDER — CHLORHEXIDINE GLUCONATE CLOTH 2 % EX PADS: 6.0000 | MEDICATED_PAD | Freq: Every day | CUTANEOUS | Status: DC

## 2022-04-08 MED ORDER — CALCITRIOL 1 MCG/ML PO SOLN
2.0000 ug | Freq: Two times a day (BID) | ORAL | Status: DC
Start: 2022-04-08 — End: 2022-04-09
  Filled 2022-04-08 (×3): qty 2

## 2022-04-08 MED ORDER — NOREPINEPHRINE 4 MG/250ML-% IV SOLN
INTRAVENOUS | Status: AC
  Filled 2022-04-08: qty 250

## 2022-04-08 MED ORDER — METOPROLOL TARTRATE 5 MG/5ML IV SOLN
5.0000 mg | Freq: Four times a day (QID) | INTRAVENOUS | Status: DC | PRN
  Administered 2022-04-09 – 2022-04-13 (×2): 5 mg via INTRAVENOUS
  Filled 2022-04-08 (×2): qty 5

## 2022-04-08 MED ORDER — ALBUMIN HUMAN 25 % IV SOLN
25.0000 g | Freq: Once | INTRAVENOUS | Status: AC
  Administered 2022-04-08: 25 g via INTRAVENOUS
  Filled 2022-04-08: qty 100

## 2022-04-08 MED ORDER — DEXTROSE 50 % IV SOLN
25.0000 g | INTRAVENOUS | Status: AC
  Administered 2022-04-08: 25 g via INTRAVENOUS

## 2022-04-08 MED ORDER — LEVETIRACETAM 500 MG PO TABS: 500.0000 mg | ORAL_TABLET | ORAL | Status: DC

## 2022-04-08 MED ORDER — NOREPINEPHRINE 4 MG/250ML-% IV SOLN
0.0000 ug/min | INTRAVENOUS | Status: DC
  Administered 2022-04-08: 2 ug/min via INTRAVENOUS

## 2022-04-08 MED ORDER — METOPROLOL TARTRATE 25 MG PO TABS
25.0000 mg | ORAL_TABLET | Freq: Two times a day (BID) | ORAL | Status: DC
  Administered 2022-04-08 – 2022-04-15 (×13): 25 mg
  Filled 2022-04-08 (×15): qty 1

## 2022-04-08 MED ORDER — AMLODIPINE BESYLATE 10 MG PO TABS
10.0000 mg | ORAL_TABLET | Freq: Every day | ORAL | Status: DC
Start: 2022-04-09 — End: 2022-04-08

## 2022-04-08 MED ORDER — HEPARIN (PORCINE) 25000 UT/250ML-% IV SOLN
1600.0000 [IU]/h | INTRAVENOUS | Status: DC
  Administered 2022-04-08: 1000 [IU]/h via INTRAVENOUS
  Administered 2022-04-09: 1500 [IU]/h via INTRAVENOUS
  Administered 2022-04-10 – 2022-04-15 (×9): 1650 [IU]/h via INTRAVENOUS
  Filled 2022-04-08 (×11): qty 250

## 2022-04-08 MED ORDER — LEVETIRACETAM IN NACL 1000 MG/100ML IV SOLN
1000.0000 mg | Freq: Every day | INTRAVENOUS | Status: DC
  Administered 2022-04-10 – 2022-04-15 (×2): 1000 mg via INTRAVENOUS
  Filled 2022-04-08 (×7): qty 100

## 2022-04-08 MED ORDER — ACETAMINOPHEN 325 MG PO TABS: 650.0000 mg | ORAL_TABLET | ORAL | Status: DC

## 2022-04-08 MED ORDER — CALCIUM CARBONATE ANTACID 1250 MG/5ML PO SUSP
2500.0000 mg | Freq: Three times a day (TID) | ORAL | Status: DC
Start: 2022-04-08 — End: 2022-04-09
  Administered 2022-04-08 (×2): 2500 mg
  Filled 2022-04-08 (×3): qty 25

## 2022-04-08 MED ORDER — ACETAMINOPHEN 160 MG/5ML PO SOLN
650.0000 mg | ORAL | Status: DC
  Administered 2022-04-08 – 2022-04-15 (×23): 650 mg
  Filled 2022-04-08 (×22): qty 20.3

## 2022-04-08 MED ORDER — VITAL 1.5 CAL PO LIQD
1000.0000 mL | ORAL | Status: DC
  Administered 2022-04-08 – 2022-04-16 (×5): 1000 mL

## 2022-04-08 NOTE — Progress Notes (Signed)
[facility] Progress Note  Subjective: pt seen in ICU, on vent and sedated. CXR shows bilat infiltrates worse persistent from yesterday.   Vitals:   04/08/22 0752 04/08/22 0756 04/08/22 0800 04/08/22 0900  BP: (!) 102/53  (!) 113/58 127/75  Pulse: 72     Resp: (!) 22  (!) 25 (!) 32  Temp:  97.7 F (36.5 C) 97.6 F (36.4 C) 99 F (37.2 C)  TempSrc:  Rectal Rectal Rectal  SpO2: 98%  94% 98%  Weight:      Height:        Exam: Gen on vent, sedated No rash, cyanosis or gangrene Sclera anicteric, throat w/ ETT No jvd or bruits Chest clear anterior/ lateral RRR no MRG Abd soft ntnd no mass or ascites +bs GU defer MS no joint effusions or deformity Ext no pitting UE/ LE edema, no wounds or ulcers Neuro is on vent, sedated   LUE AVF +bruit    [facility]: [facility]  4h  65kg  2/3 bath  LUE AVF  Hep none - hectorol 4 u g tiw - venofer '100mg'$   - mircera 225 q 2, last 8/30    Summary: 56 y.o. female ESRD, h/o transplant, PTX, HTN, depression p/w AMS.  Rx only 1hr19mn of HD on 9/1 bec of lethargy and confusion  noted to be somnolent and initially thought that Ativan was contributing. Pt has been confused for 1-2 days; recently had a parathyroidecomy on 8/15 and was treated for hypocalcemia subsequently. Her regimen was supposed to be  liquid CaCO3 '2400mg'$  suspension 4x daily but according to the mother she did not tolerate this in the hospital last admission and it was confirmed with mother and daughter (lives w/ daughter) she was not taking any Ca at home.  Ca noted to be 5.5 in the ED with a pCO2 of 18. Patient had facial twitching in the ED with subsequent MRI of the brain showed gyriform diffusion restriction of the anterior right frontal lob suggestive of acute seizure/status epilepticus. EEG confirmed that it was coming from the right frontal lobe. Pt admitted, we were asked to see for ESRD.    Assessment/ Plan: Cardiac arrest - on 9/4 VDRF - w/ bilat infiltrates by  CXR, pulm edema +/- aspiration pneumonitis.  Volume - up multiple kg by wts, not sure if accurate. Max UF w/ HD again today. Got 2L off yest but HD cut short due to rapid afib. Plan serial HD for volume Hypocalcemia/ hungry bone syndrome sp recent total parathyroidectomy 8/15. Pt dc'd on CaCO3 2400 elemental qid, but per the family she was not taking any calcium at home. Pt here now w/ seizures and hypocalcemia. We restarted her po CaCO3 here but she was not taking po reliably so NG tube was placed. Got tid IV Ca gluc boluses 4 gm yest for low Ca in the 6s  - no high Ca++ bath available here, but will need at dc  - Ca up 7.4 today, corr Ca around 8.5 - lower po CaCO3 to '2500mg'$  elemental tid between meals - cont po calcitriol 2 ug bid - hold IV Ca gluc today as corr Ca > 8.5 - cont tid serum Ca++ levels  - no high Ca++ bath available here, but will need at dc  - maintain NG tube until can reliably swallow CaCO3 pills/ liquids  ESRD - cont HD MWF. HD today off schedule. May need serial HD for vol HTN - BP's lower today, on sedation in  ICU and post arrest. Will dc norvasc.  Seizures - seen by neuro -> arising from right frontal region + severe diffuse encephalopathy.  Anemia esrd -Hgb 8.5, just received mircera 23mg on 8/30, next due 9/13.  Secondary hyperparathyroidism -see as above hypocalcemia. Binders are on hold post PTX and phos is stable in range.  History of meningioma - status post resection 11/01/2020  RKelly Splinter9/12/2021, 9:32 AM   Recent Labs  Lab 04/07/22 1246 04/07/22 1308 04/07/22 2010 04/08/22 0302 04/08/22 0838  HGB 9.2* 9.5*  --   --  8.1*  ALBUMIN 2.6*  --   --  2.1*  --   CALCIUM 8.8*  --   --  7.8*  --   PHOS  --   --  3.7 3.9  --   CREATININE 13.34*  --   --  8.77*  --   K 5.9* 5.9*  --  4.8  --     No results for input(s): "IRON", "TIBC", "FERRITIN" in the last 168 hours. Inpatient medications:  acetaminophen  650 mg Oral Q4H   Or   acetaminophen  (TYLENOL) oral liquid 160 mg/5 mL  650 mg Per Tube Q4H   Or   acetaminophen  650 mg Rectal Q4H   amLODipine  10 mg Oral Daily   calcitRIOL  2 mcg Oral BID BM   calcium carbonate (dosed in mg elemental calcium)  3,000 mg of elemental calcium Per Tube TID   Chlorhexidine Gluconate Cloth  6 each Topical Q0600   docusate  100 mg Per Tube BID   feeding supplement (PROSource TF20)  60 mL Per Tube Daily   feeding supplement (VITAL HIGH PROTEIN)  1,000 mL Per Tube Q24H   heparin  5,000 Units Subcutaneous Q8H   levETIRAcetam  1,000 mg Oral Daily   levETIRAcetam  500 mg Oral Once per day on Mon Wed Fri   mouth rinse  15 mL Mouth Rinse Q2H   pantoprazole  40 mg Per Tube Daily   phenytoin (DILANTIN) IV  100 mg Intravenous Q8H   polyethylene glycol  17 g Per Tube Daily    amiodarone 30 mg/hr (04/08/22 0900)   cefTRIAXone (ROCEPHIN)  IV 2 g (04/07/22 1304)   clevidipine 3 mg/hr (04/08/22 0900)   lacosamide (VIMPAT) IV Stopped (04/07/22 2248)   levETIRAcetam 1,000 mg (04/06/22 1147)   levETIRAcetam     propofol (DIPRIVAN) infusion 55 mcg/kg/min (04/08/22 0900)   albuterol, busPIRone **OR** busPIRone, diclofenac Sodium, fentaNYL (SUBLIMAZE) injection, fentaNYL (SUBLIMAZE) injection, mouth rinse

## 2022-04-08 NOTE — Plan of Care (Signed)
  Problem: Clinical Measurements: Goal: Will remain free from infection Outcome: Progressing Goal: Diagnostic test results will improve Outcome: Progressing Goal: Cardiovascular complication will be avoided Outcome: Progressing   Problem: Nutrition: Goal: Adequate nutrition will be maintained Outcome: Progressing

## 2022-04-08 NOTE — Progress Notes (Signed)
PT Cancellation Note  Patient Details Name: Ruth Gutierrez MRN: [id number] DOB: 06/22/1966   Cancelled Treatment:    Reason Eval/Treat Not Completed: Medical issues which prohibited therapy (Pt intubated and sedated over weekend. Will sign off. Please reorder as appropriate.)   Alvira Philips 04/08/2022, 1:28 PM Kazi Montoro M,PT [place] [phone]

## 2022-04-08 NOTE — Progress Notes (Signed)
ANTICOAGULATION CONSULT NOTE - Initial Consult  Pharmacy Consult for Heparin Indication: atrial fibrillation  Allergies  Allergen Reactions   Penicillins Itching and Rash    Did it involve swelling of the face/tongue/throat, SOB, or low BP?Y Did it involve sudden or severe rash/hives, skin peeling, or any reaction on the inside of your mouth or nose? Y Did you need to seek medical attention at a hospital or doctor's office? Y When did it last happen?  2016     If all above answers are "NO", may proceed with cephalosporin use. 04/07/22> previously tolerated ceftriaxone, cefepime    Patient Measurements: Height: '5\' 4"'$  (162.6 cm) Weight: 71.8 kg (158 lb 4.6 oz) IBW/kg (Calculated) : 54.7 Heparin Dosing Weight: 70.5 kg  Vital Signs: Temp: 99.2 F (37.3 C) (09/05 2020) Temp Source: Rectal (09/05 2020) BP: 147/79 (09/05 2001) Pulse Rate: 86 (09/05 2001)  Labs: Recent Labs    04/07/22 0318 04/07/22 1246 04/07/22 1308 04/08/22 0302 04/08/22 0838 04/08/22 1458 04/08/22 1657 04/08/22 2015  HGB 8.6* 9.2*   < >  --  8.1* 8.8* 8.2*  --   HCT 26.7* 30.2*   < >  --  25.5* 26.0* 24.0*  --   PLT 179 152  --   --  151  --   --   --   HEPARINUNFRC  --   --   --   --   --   --   --  0.12*  CREATININE 12.06* 13.34*  --  8.77*  --   --   --   --    < > = values in this interval not displayed.     Estimated Creatinine Clearance: 7.1 mL/min (A) (by C-G formula based on SCr of 8.77 mg/dL (H)).   Medical History: Past Medical History:  Diagnosis Date   Anemia of chronic disease    Arthritis    Deceased-donor kidney transplant    Performed at [facility], April 2010.  Initial ESRD due to HTN nephropathy   Eczema    ESRD (end stage renal disease) ([place])    M/W/F dialysis   FUO (fever of unknown origin) 05/17/2015   GERD (gastroesophageal reflux disease)    Headache(784.0)    History of hyperparathyroidism    Hypertension    Peritonitis ([place]) 10/2019   Shortness of breath     Wears glasses     Assessment: 56 yo female with history of ESRD - HD MWF, HTN, s/p parathyroidectomy, and depression. Renal transplant in 2010 failed. Recent surgery, parathyroidectomy, on 03/18/2022. Episode of afib with RVR during HD on 9/4. No anticoagulation PTA. Pharmacy consulted for IV heparin. No evidence of bleeding on CT 9/5.   CBC stable, Hgb 8.1, PLT 151.   Per RN, no issues with infusion/line/no heparin holds. Heparin level 0.12 (subtherapeutic)  Goal of Therapy:  Heparin level 0.3-0.7 units/ml Monitor platelets by anticoagulation protocol: Yes   Plan:  Increase heparin infusion rate to 1250 units/hr Check 8 hr heparin level '@0500'$  Monitor daily CBC and s/sx of bleeding    Wilson Singer, PharmD Clinical Pharmacist 04/08/2022 8:54 PM

## 2022-04-08 NOTE — Progress Notes (Signed)
ANTICOAGULATION CONSULT NOTE - Initial Consult  Pharmacy Consult for Heparin Indication: atrial fibrillation  Allergies  Allergen Reactions   Penicillins Itching and Rash    Did it involve swelling of the face/tongue/throat, SOB, or low BP?Y Did it involve sudden or severe rash/hives, skin peeling, or any reaction on the inside of your mouth or nose? Y Did you need to seek medical attention at a hospital or doctor's office? Y When did it last happen?  2016     If all above answers are "NO", may proceed with cephalosporin use. 04/07/22> previously tolerated ceftriaxone, cefepime    Patient Measurements: Height: '5\' 4"'$  (162.6 cm) Weight: 75.3 kg (166 lb 0.1 oz) IBW/kg (Calculated) : 54.7 Heparin Dosing Weight: 70.5 kg  Vital Signs: Temp: 99 F (37.2 C) (09/05 1000) Temp Source: Rectal (09/05 0900) BP: 112/53 (09/05 1000) Pulse Rate: 78 (09/05 1000)  Labs: Recent Labs    04/07/22 0318 04/07/22 1246 04/07/22 1308 04/08/22 0302 04/08/22 0838  HGB 8.6* 9.2* 9.5*  --  8.1*  HCT 26.7* 30.2* 28.0*  --  25.5*  PLT 179 152  --   --  151  CREATININE 12.06* 13.34*  --  8.77*  --     Estimated Creatinine Clearance: 7.3 mL/min (A) (by C-G formula based on SCr of 8.77 mg/dL (H)).   Medical History: Past Medical History:  Diagnosis Date   Anemia of chronic disease    Arthritis    Deceased-donor kidney transplant    Performed at [facility], April 2010.  Initial ESRD due to HTN nephropathy   Eczema    ESRD (end stage renal disease) ([place])    M/W/F dialysis   FUO (fever of unknown origin) 05/17/2015   GERD (gastroesophageal reflux disease)    Headache(784.0)    History of hyperparathyroidism    Hypertension    Peritonitis ([place]) 10/2019   Shortness of breath    Wears glasses     Assessment: 56 yo female with history of ESRD - HD MWF, HTN, s/p parathyroidectomy, and depression. Renal transplant in 2010 failed. Recent surgery, parathyroidectomy, on 03/18/2022. Episode of afib  with RVR during HD on 9/4. No anticoagulation PTA. Pharmacy consulted for IV heparin. No evidence of bleeding on CT 9/5. LD of SQ heparin 9/5 ~0600.   CBC stable, Hgb 8.1, PLT 151.   Goal of Therapy:  Heparin level 0.3-0.7 units/ml Monitor platelets by anticoagulation protocol: Yes   Plan:  DC SQ heparin  No bolus with recent SQ heparin dose and ESRD on HD MWF Start IV heparin 1000 units/hr  Check 8 hr heparin level  Monitor daily CBC and s/sx of bleeding   Francena Hanly, PharmD Pharmacy Resident  04/08/2022 12:05 PM

## 2022-04-08 NOTE — Progress Notes (Signed)
eLink Physician-Brief Progress Note Patient Name: Ruth Gutierrez DOB: 1966-07-24 MRN: [id number]   Date of Service  04/08/2022  HPI/Events of Note  Ionized calcium 0.96  eICU Interventions  Calcium gluconate 1 gm iv bolus ordered.        Ruth Gutierrez Ruth Gutierrez 04/08/2022, 10:00 PM

## 2022-04-08 NOTE — Progress Notes (Signed)
Subjective:Patient had an episode of decreased responsiveness yesterday followed by asystole requiring CPR. Was transferred to ICU and started on propofol  ROS: Unable to obtain due to poor mental status  Examination  Vital signs in last 24 hours: Temp:  [95.9 F (35.5 C)-101.3 F (38.5 C)] 99.3 F (37.4 C) (09/05 1600) Pulse Rate:  [52-177] 90 (09/05 1600) Resp:  [16-33] 20 (09/05 1600) BP: (100-176)/(47-114) 140/63 (09/05 1600) SpO2:  [71 %-100 %] 97 % (09/05 1600) FiO2 (%):  [40 %-100 %] 40 % (09/05 1600) Weight:  [74.4 kg-75.3 kg] 74.4 kg (09/05 1200)  General: lying in bed, NAD RS: intubated Neuro: on propofol '@55'$ , opens eyes but doesn't track examiner, doesn't follow commands, PERLA, blinks to threat BL, withdraws to noxious stimuli in all extremities  Basic Metabolic Panel: Recent Labs  Lab 04/14/2022 2019 05/01/2022 2346 04/05/22 0219 04/05/22 0926 04/06/22 0359 04/06/22 1751 04/07/22 0318 04/07/22 1150 04/07/22 1246 04/07/22 1308 04/07/22 2010 04/08/22 0302 04/08/22 1458 04/08/22 1529  NA 137   < > 138  --  138  --  139  --  137 134*  --  134* 134*  --   K 4.1   < > 4.0  --  4.3  --  5.2*  --  5.9* 5.9*  --  4.8 3.1*  --   CL 96*  --  98  --  98  --  96*  --  98  --   --  93*  --   --   CO2 26  --  27  --  23  --  23  --  19*  --   --  25  --   --   GLUCOSE 161*  --  111*  --  74  --  89  --  83  --   --  154*  --   --   BUN 31*  --  33*  --  39*  --  47*  --  49*  --   --  36*  --   --   CREATININE 8.18*  --  8.96*  --  10.69*  --  12.06*  --  13.34*  --   --  8.77*  --   --   CALCIUM 5.5*  --  5.7*   < > 5.4*   < > 6.4*  --  8.8*  --   --  7.8*  --  7.7*  MG 2.4  --   --   --  2.4  --   --   --   --   --  2.1 2.0  --   --   PHOS  --   --   --   --   --   --   --  4.7*  --   --  3.7 3.9  --   --    < > = values in this interval not displayed.    CBC: Recent Labs  Lab 04/05/22 0219 04/06/22 0359 04/07/22 0318 04/07/22 1246 04/07/22 1308 04/08/22 0838  04/08/22 1458  WBC 9.6 7.4 9.8 14.8*  --  10.9*  --   HGB 8.5* 7.8* 8.6* 9.2* 9.5* 8.1* 8.8*  HCT 25.7* 23.9* 26.7* 30.2* 28.0* 25.5* 26.0*  MCV 88.3 89.5 90.2 95.9  --  92.1  --   PLT 180 172 179 152  --  151  --      Coagulation Studies: No results for input(s): "LABPROT", "INR" in the last  72 hours.  Imaging CTH wo contrast 04/08/2022: No evidence of acute intracranial abnormality. Redemonstrated changes of bifrontal craniotomy for resection of a meningioma resection. Similar encephalomalacia. Evaluation for residual tumor was more sensitive on recent MRI. No evidence of acute hemorrhage, mass lesion, midline shift, or large vascular territory infarct.   ASSESSMENT AND PLAN:  59 old female with status epilepticus arising from right frontal region in the setting of severe hypercalcemia and prior meningioma resection.   Refractory electrographic/nonconvulsive status epilepticus, resolved Acute encephalopathy - No further seizures overnight   Recommendations - Ok to wean propofol '@10mcg'$ /hr to stop. Can use precedex or fentanyl for sedation, agitation if eeded -Continue current dose of Keppra, Vimpat and phenytoin. Will check dilantin level - will will add onfi '5mg'$  daily -Continue LTM EEG overnight. -Management of rest of comorbidities per primary team -Discussed plan with critical care team     CRITICAL CARE Performed by: Lora Havens   Total critical care time: 38 minutes  Critical care time was exclusive of separately billable procedures and treating other patients.  Critical care was necessary to treat or prevent imminent or life-threatening deterioration.  Critical care was time spent personally by me on the following activities: development of treatment plan with patient and/or surrogate as well as nursing, discussions with consultants, evaluation of patient's response to treatment, examination of patient, obtaining history from patient or surrogate, ordering and  performing treatments and interventions, ordering and review of laboratory studies, ordering and review of radiographic studies, pulse oximetry and re-evaluation of patient's condition.     Zeb Comfort Epilepsy [facility] For questions after 5pm please refer to AMION to reach the Neurologist on call

## 2022-04-08 NOTE — Procedures (Signed)
Patient Name: Ruth Gutierrez  MRN: [id number]  Epilepsy Attending: Lora Havens  Referring Physician/Provider: Lora Havens, MD Duration: 04/08/2022 0221 to 04/09/2022 0221   Patient history: 56 y.o. female with PMH significant forESRD on HD, headaches, history of meningioma with bifrontal severe vasogenic edema s/p resection in April 2022, HTN who became confused and lethargic during HD and was sent to the ED. EEG to evaluate for seizure   Level of alertness:  lethargic    AEDs during EEG study: LEV, PHT, LCM, propofol   Technical aspects: This EEG study was done with scalp electrodes positioned according to the 10-20 International system of electrode placement. Electrical activity was reviewed with band pass filter of 1-'70Hz'$ , sensitivity of 7 uV/mm, display speed of 94m/sec with a '60Hz'$  notched filter applied as appropriate. EEG data were recorded continuously and digitally stored.  Video monitoring was available and reviewed as appropriate.   Description: EEG showed near continuous low amplitude 3 to 7 Hz theta-delta slowing admixed with polyspikes in bifrontal region every 2 to 10 seconds.   ABNORMALITY - Spikes, bifrontal region - Continuous slow, generalized    IMPRESSION: This study showed evidence of epileptogenicity arising from bifrontal region.  Additionally there was moderate to severe diffuse encephalopathy, nonspecific etiology but likely related to seizure.  No definite seizure was seen during the study.    Ethelbert Thain OBarbra Sarks

## 2022-04-08 NOTE — Progress Notes (Signed)
NAME:  Ruth Gutierrez, MRN:  [id number], DOB:  08-19-1965, LOS: 3 ADMISSION DATE:  04/27/2022, CONSULTATION DATE:  04/07/2022 REFERRING MD:  Dr. Philipp Ovens, CHIEF COMPLAINT:  Acute Respiratory Failure   History of Present Illness:  56 y/o female with history of ESRD s/p renal transplant on HD MWF, HTN, s/p parathyroidectomy, and depression. Admitted to ICU following cardiac arrest due to aspiration caused by seizure.  Now critically ill due to acute hypoxic respiratory failure requiring mechanical ventilation.  Pertinent  Medical History   Past Medical History:  Diagnosis Date   Anemia of chronic disease    Arthritis    Deceased-donor kidney transplant    Performed at [facility], April 2010.  Initial ESRD due to HTN nephropathy   Eczema    ESRD (end stage renal disease) ([place])    M/W/F dialysis   FUO (fever of unknown origin) 05/17/2015   GERD (gastroesophageal reflux disease)    Headache(784.0)    History of hyperparathyroidism    Hypertension    Peritonitis ([place]) 10/2019   Shortness of breath    Wears glasses      Significant Hospital Events: Including procedures, antibiotic start and stop dates in addition to other pertinent events   9/1 - admitted with AMS found to have seizures on EEG 9/3 - NG tube placed 9/4 - gradual increase in oxygen requirements, code blue at 1150, intubated and transferred to ICU. Went into afib with rvr during HD, started amio drip 9/5 - amiodarone stopped, metoprolol started, CT head unchanged, remains on vent  Interim History / Subjective:  Amiodarone was cycled on and off last night due to bradycardia. On amio this am.   Objective   Blood pressure (!) 113/58, pulse (!) 58, temperature 97.6 F (36.4 C), temperature source Rectal, resp. rate (!) 25, height '5\' 4"'$  (1.626 m), weight 75.3 kg, last menstrual period 06/02/2015, SpO2 94 %.    Vent Mode: PRVC FiO2 (%):  [40 %-100 %] 50 % Set Rate:  [20 bmp-24 bmp] 24 bmp Vt Set:  [430 mL] 430  mL PEEP:  [5 cmH20] 5 cmH20 Plateau Pressure:  [13 PZW25-85 cmH20] 21 cmH20   Intake/Output Summary (Last 24 hours) at 04/08/2022 0813 Last data filed at 04/08/2022 0700 Gross per 24 hour  Intake 2205.63 ml  Output 1.5 ml  Net 2204.13 ml   Filed Weights   04/07/22 1330 04/07/22 1609 04/08/22 0500  Weight: 71.4 kg 71.2 kg 75.3 kg    Examination: General: Ill appearing female laying in bed, intubated and sedated HENT: [place]/AT, EEG leads in place Lungs: Ventilated breath sounds bilaterally, CTA Cardiovascular: RRR Abdomen: Soft, positive BS  Extremities: Minimal LE edema, palpable radial and pedal pulses bilaterally Neuro: Sedated RASS -5, sluggish but reactive pupils, cough/gag reflex intact  Resolved Hospital Problem list     Assessment & Plan:  Acute Respiratory Failure ARDS 2/2 aspiration IHCA  AGMA Pt had a seizure, agonal breathing, and lost a pulse on 9/4 with ROSC after 19 minutes of CPR. Intubated and sedated. ABG with pH of 7.248 and pCO2 of 48.1. TTM orders in if needed, patient has been normothermic today. - Continue vent management and attempt to wean sedation as possible - Increase RR to 28-30 - Repeat ABG this afternoon - continue ceftriaxone   Status Epilepticus Suspected right frontal region origin. On propofol, phenytoin, keppra, and vimpat. Neurology managing.  - Continue EEG and medication per neurology  Hypocalcemia Post Parathyroidectomy Calcium of 5.5, ionized 0.62 on admission. Responded  well to aggressive repletion. Corrected calcium 9.3 today. - Continue with repletion  ESRD on HD MWF Renal transplant in 2010, failed. Episode of Afib with RVR during HD on 9/4, prematurely ended HD. - Continue HD per nephrology - HD today  Afib w/ RVR Episode during HD on 9/4. Amiodarone gtt started. Has been turned off a few times for bradycardia. On cleviprex gtt. Stable this am. Head CT without acute bleed. - d/c amiodarone  - start metoprolol  - titrate down  cleviprex - heparin gtt  Hyperthyroidism TSH undetectable with normal free T4 and T3. This pattern likely represents subclinical hyperthyroidism. As we cannot do a RAIU scan due to recent amiodarone bolus we will not treat at this time with no reported hyperthyroid symptoms prior to admission. Will consider further workup and management after pending T3 and TSH ab result.   Hypertension Previously on amlodipine 10 mg daily. Has been on cleviprex gtt. BB as above. We will continue to monitor.  Advanced Care Planning Pt is critically ill with unknown prognosis. We will consider palliative consult in the event she is not showing expected improvements.   Best Practice (right click and "Reselect all SmartList Selections" daily)   Diet/type: tubefeeds DVT prophylaxis: systemic heparin GI prophylaxis: PPI Lines: Central line Foley:  N/A Code Status:  full code Last date of multidisciplinary goals of care discussion [9/5]  Labs   CBC: Recent Labs  Lab 04/29/2022 2019 04/27/2022 2346 04/05/22 0219 04/06/22 0359 04/07/22 0318 04/07/22 1246 04/07/22 1308  WBC 10.5  --  9.6 7.4 9.8 14.8*  --   HGB 9.1*   < > 8.5* 7.8* 8.6* 9.2* 9.5*  HCT 28.3*   < > 25.7* 23.9* 26.7* 30.2* 28.0*  MCV 89.3  --  88.3 89.5 90.2 95.9  --   PLT 217  --  180 172 179 152  --    < > = values in this interval not displayed.    Basic Metabolic Panel: Recent Labs  Lab 04/05/2022 2019 04/14/2022 2346 04/05/22 0219 04/05/22 0926 04/06/22 0359 04/06/22 1751 04/07/22 0318 04/07/22 1150 04/07/22 1246 04/07/22 1308 04/07/22 2010 04/08/22 0302  NA 137   < > 138  --  138  --  139  --  137 134*  --  134*  K 4.1   < > 4.0  --  4.3  --  5.2*  --  5.9* 5.9*  --  4.8  CL 96*  --  98  --  98  --  96*  --  98  --   --  93*  CO2 26  --  27  --  23  --  23  --  19*  --   --  25  GLUCOSE 161*  --  111*  --  74  --  89  --  83  --   --  154*  BUN 31*  --  33*  --  39*  --  47*  --  49*  --   --  36*  CREATININE 8.18*  --   8.96*  --  10.69*  --  12.06*  --  13.34*  --   --  8.77*  CALCIUM 5.5*  --  5.7*   < > 5.4* 5.7* 6.4*  --  8.8*  --   --  7.8*  MG 2.4  --   --   --  2.4  --   --   --   --   --  2.1 2.0  PHOS  --   --   --   --   --   --   --  4.7*  --   --  3.7 3.9   < > = values in this interval not displayed.   GFR: Estimated Creatinine Clearance: 7.3 mL/min (A) (by C-G formula based on SCr of 8.77 mg/dL (H)). Recent Labs  Lab 04/05/22 0219 04/06/22 0359 04/07/22 0318 04/07/22 1246 04/07/22 1527 04/07/22 2010  WBC 9.6 7.4 9.8 14.8*  --   --   LATICACIDVEN  --   --   --  6.9* 1.7 2.3*    Liver Function Tests: Recent Labs  Lab 04/27/2022 2019 04/05/22 0219 04/07/22 0318 04/07/22 1246 04/08/22 0302  AST 19 16 13* 116*  --   ALT '19 17 13 '$ 90*  --   ALKPHOS 648* 613* 573* 650*  --   BILITOT 0.8 0.7 0.6 0.7  --   PROT 7.3 6.5 6.5 6.9  --   ALBUMIN 2.8* 2.7* 2.6* 2.6* 2.1*   No results for input(s): "LIPASE", "AMYLASE" in the last 168 hours. No results for input(s): "AMMONIA" in the last 168 hours.  ABG    Component Value Date/Time   PHART 7.248 (L) 04/07/2022 1308   PCO2ART 48.1 (H) 04/07/2022 1308   PO2ART 187 (H) 04/07/2022 1308   HCO3 20.9 04/07/2022 1308   TCO2 22 04/07/2022 1308   ACIDBASEDEF 6.0 (H) 04/07/2022 1308   O2SAT 99 04/07/2022 1308     Coagulation Profile: Recent Labs  Lab 04/05/22 0219  INR 1.5*    Cardiac Enzymes: No results for input(s): "CKTOTAL", "CKMB", "CKMBINDEX", "TROPONINI" in the last 168 hours.  HbA1C: Hemoglobin A1C  Date/Time Value Ref Range Status  12/20/2020 10:01 AM 5.0 4.0 - 5.6 % Final    CBG: Recent Labs  Lab 04/07/22 1546 04/07/22 1925 04/07/22 2321 04/08/22 0324 04/08/22 0755  GLUCAP 70 119* 105* 122* 102*    Review of Systems:   Unable to perform, sedated.  Past Medical History:  She,  has a past medical history of Anemia of chronic disease, Arthritis, Deceased-donor kidney transplant, Eczema, ESRD (end stage renal  disease) ([place]), FUO (fever of unknown origin) (05/17/2015), GERD (gastroesophageal reflux disease), Headache(784.0), History of hyperparathyroidism, Hypertension, Peritonitis ([place]) (10/2019), Shortness of breath, and Wears glasses.   Surgical History:   Past Surgical History:  Procedure Laterality Date   A/V FISTULAGRAM Left 02/12/2017   Procedure: A/V Fistulagram;  Surgeon: Algernon Huxley, MD;  Location: [place] [facility];  Service: Cardiovascular;  Laterality: Left;   A/V FISTULAGRAM Left 12/30/2017   Procedure: A/V FISTULAGRAM;  Surgeon: Algernon Huxley, MD;  Location: [place] [facility];  Service: Cardiovascular;  Laterality: Left;   A/V FISTULAGRAM Left 04/01/2021   Procedure: A/V FISTULAGRAM;  Surgeon: Algernon Huxley, MD;  Location: [place] [facility];  Service: Cardiovascular;  Laterality: Left;   A/V SHUNT INTERVENTION N/A 02/12/2017   Procedure: A/V Shunt Intervention;  Surgeon: Algernon Huxley, MD;  Location: [place] [facility];  Service: Cardiovascular;  Laterality: N/A;   AV FISTULA PLACEMENT     BASCILIC VEIN TRANSPOSITION Left 10/30/2014   Procedure: LEFT [place];  Surgeon: Rosetta Posner, MD;  Location: [place];  Service: Vascular;  Laterality: Left;   [place] Left 01/03/2015   Procedure: LEFT ARM 2ND STAGE [place];  Surgeon: Rosetta Posner, MD;  Location: [place];  Service: Vascular;  Laterality: Left;  BIOPSY  10/23/2021   Procedure: BIOPSY;  Surgeon: Jackquline Denmark, MD;  Location: [facility];  Service: Gastroenterology;;   BREAST BIOPSY Left    Patient doesn't remember any information from previous procedure.   CAPD REMOVAL N/A 10/31/2019   Procedure: PERITONEAL DIALYSIS  (CAPD) INFECTED CATHETER REMOVAL;  Surgeon: Coralie Keens, MD;  Location: [place];  Service: General;  Laterality: N/A;   CRANIOTOMY N/A 11/01/2020   Procedure: CRANIOTOMY FOR TUMOR EXCISION;  Surgeon: [name], MD;  Location: [place];   Service: Neurosurgery;  Laterality: N/A;   ESOPHAGOGASTRODUODENOSCOPY (EGD) WITH PROPOFOL N/A 10/23/2021   Procedure: ESOPHAGOGASTRODUODENOSCOPY (EGD) WITH PROPOFOL;  Surgeon: Jackquline Denmark, MD;  Location: [place];  Service: Gastroenterology;  Laterality: N/A;   FRACTURE SURGERY     left foot,baby toe nad next toe missing   INSERTION OF DIALYSIS CATHETER Right 10/30/2014   Procedure: INSERTION OF DIALYSIS CATHETER;  Surgeon: Rosetta Posner, MD;  Location: [place];  Service: Vascular;  Laterality: Right;   KIDNEY TRANSPLANT  11/02/2008   [facility])   PARATHYROIDECTOMY N/A 03/18/2022   Procedure: TOTAL PARATHYROIDECTOMY;  Surgeon: Armandina Gemma, MD;  Location: [place];  Service: General;  Laterality: N/A;   PLACEMENT OF LUMBAR DRAIN N/A 11/01/2020   Procedure: PLACEMENT OF LUMBAR DRAIN;  Surgeon: [name], MD;  Location: [place];  Service: Neurosurgery;  Laterality: N/A;   WISDOM TOOTH EXTRACTION       Social History:   reports that she has been smoking cigarettes. She has a 13.50 pack-year smoking history. She has never used smokeless tobacco. She reports that she does not drink alcohol and does not use drugs.   Family History:  Her family history includes Deep vein thrombosis in her brother; Diabetes in her brother; Hyperlipidemia in her sister; Hypertension in her father, mother, sister, and sister; Kidney disease in her brother. There is no history of Colon cancer, Esophageal cancer, Rectal cancer, or Breast cancer.   Allergies Allergies  Allergen Reactions   Penicillins Itching and Rash    Did it involve swelling of the face/tongue/throat, SOB, or low BP?Y Did it involve sudden or severe rash/hives, skin peeling, or any reaction on the inside of your mouth or nose? Y Did you need to seek medical attention at a hospital or doctor's office? Y When did it last happen?  2016     If all above answers are "NO", may proceed with cephalosporin use. 04/07/22> previously tolerated  ceftriaxone, cefepime     Home Medications  Prior to Admission medications   Medication Sig Start Date End Date Taking? Authorizing Provider  acetaminophen (TYLENOL) 500 MG tablet Take 500 mg by mouth every 6 (six) hours as needed.   Yes [provider]  albuterol (VENTOLIN HFA) 108 (90 Base) MCG/ACT inhaler Inhale 3 puffs into the lungs daily as needed for wheezing or shortness of breath. 11/12/21 04/05/22 Yes Gaylan Gerold, DO  amLODipine (NORVASC) 10 MG tablet Take 1 tablet (10 mg total) by mouth daily. 07/02/21  Yes Lajean Manes, MD  cloNIDine (CATAPRES) 0.1 MG tablet Take 0.1 mg by mouth 2 (two) times daily. 04/01/22  Yes [provider]  diclofenac Sodium (VOLTAREN) 1 % GEL Apply 2 g topically 2 (two) times daily as needed (Right shoulder pain). Patient taking differently: Apply 1 Application topically daily as needed (Right shoulder pain). 01/20/22  Yes Gaylan Gerold, DO  diphenhydrAMINE (BENADRYL) 25 mg capsule Take 50 mg by mouth 3 (three) times daily as needed for allergies.  Yes [provider]  lidocaine-prilocaine (EMLA) cream Apply 1 application. topically every Monday, Wednesday, and Friday with hemodialysis. 02/01/20  Yes [provider]  sevelamer carbonate (RENVELA) 800 MG tablet Take 1,600 mg by mouth 3 (three) times daily with meals.   Yes [provider]  calcitRIOL (ROCALTROL) 0.5 MCG capsule Take 4 capsules (2 mcg total) by mouth 2 (two) times daily between meals. Patient not taking: Reported on 04/05/2022 03/31/22   Sanjuan Dame, MD  Calcium Carbonate Antacid (CALCIUM CARBONATE, DOSED IN MG ELEMENTAL CALCIUM,) 1250 MG/5ML SUSP Take 24 mLs (2,400 mg of elemental calcium total) by mouth 4 (four) times daily. Patient not taking: Reported on 04/05/2022 03/31/22   Sanjuan Dame, MD  pantoprazole (PROTONIX) 40 MG tablet Take 1 tablet (40 mg total) by mouth 2 (two) times daily before a meal. Patient not taking: Reported on 03/19/2022  10/24/21 02/13/22  Rosezetta Schlatter, MD     Critical care time: 81

## 2022-04-08 NOTE — Progress Notes (Signed)
Received patient in bed to unit.  Alert and oriented.  Informed consent signed and in chart.   Treatment initiated: 1230  Treatment completed: 1603  Patient tolerated well.  Transported back to the room  Alert, without acute distress.  Hand-off given to patient's nurse.   Access used: AVF Access issues: NA  Total UF removed: 3000 Medication(s) given: Albumin 25 % 25G IVPB Post HD VS: 98.9    125/63    88    19    99 ET/V on 40% O2 Post HD weight: 71.8kg   [facility]

## 2022-04-08 NOTE — Progress Notes (Signed)
Nutrition Follow-up  DOCUMENTATION CODES:   Not applicable  INTERVENTION:   Consider Cortrak placement  Tube Feeding via OG: Vital 1.5 at 50 ml/hr Pro-Source TF20 60 mL daily This provides 101 g of protein, 1880 kcals and 912 mL of free water   Additional kcals from cleviprex and propofol   NUTRITION DIAGNOSIS:   Inadequate oral intake related to acute illness as evidenced by NPO status.  GOAL:   Patient will meet greater than or equal to 90% of their needs  MONITOR:   Vent status, TF tolerance, Labs, Weight trends  REASON FOR ASSESSMENT:   Consult Enteral/tube feeding initiation and management  ASSESSMENT:   56 yo female admitted with severe symptomatic  hypocalcemia with seizures. Pt with recent parathyroidectomy on 8/15, hungry bone syndrome post surgery. PMH includes HTN, ESRD sp transplant in 2010, now on iHD.  9/04 Code Blue, cardiac arrest due to aspiration caused by seizures, vomited several times, Intubated  Pt remains on vent support Propofol: 20.7 Cleviprex: 3.3   iHD stopped halfway yesterday, plan for HD again today with max UF  OG tube in stomach per abd xray  Noted RD consult yesterday indicated to start trickle TF; noted MD ordered Vital High Protein at 40 ml/hr yesterday and pt has been tolerating at this rate thus far  Current wt 75 kg; admit weight 71.4 kg; EDW 65 kg  Hypercalcemia has improved with aggressive calcium supplementation  Labs: ionized calcium 1.07 (L), phosphorus wdl, sodium 134 (L), potassium wdl Meds: calcitriol, calcium carbonate, calcium gluconate   Diet Order:   Diet Order             Diet NPO time specified  Diet effective now                   EDUCATION NEEDS:   Not appropriate for education at this time  Skin:  Skin Assessment: Reviewed RN Assessment  Last BM:  9/5  Height:   Ht Readings from Last 1 Encounters:  04/08/22 '5\' 4"'$  (1.626 m)    Weight:   Wt Readings from Last 1 Encounters:   04/08/22 74.4 kg    BMI:  Body mass index is 28.15 kg/m.  Estimated Nutritional Needs:   Kcal:  1750-1950 kcals  Protein:  95-125 g  Fluid:  1000 mL plus UOP    Kerman Passey MS, RDN, LDN, CNSC Registered Dietitian 3 Clinical Nutrition RD Pager and On-Call Pager Number Located in [place]

## 2022-04-08 NOTE — Progress Notes (Signed)
Pt receives out-pt HD at [facility] on MWF. Pt has a 5:50 am chair time. Will assist as needed.   Melven Sartorius Renal Navigator [phone]

## 2022-04-08 NOTE — Progress Notes (Signed)
Rt note- RR decreased, ABG  f/u, P02 at 52, fio2 and peep increased to 50%/+8.

## 2022-04-09 ENCOUNTER — Inpatient Hospital Stay (HOSPITAL_COMMUNITY): Payer: Medicare Other

## 2022-04-09 DIAGNOSIS — G9341 Metabolic encephalopathy: Secondary | ICD-10-CM | POA: Diagnosis not present

## 2022-04-09 DIAGNOSIS — I469 Cardiac arrest, cause unspecified: Secondary | ICD-10-CM

## 2022-04-09 DIAGNOSIS — R4182 Altered mental status, unspecified: Secondary | ICD-10-CM | POA: Diagnosis not present

## 2022-04-09 LAB — POCT I-STAT 7, (LYTES, BLD GAS, ICA,H+H)
Acid-Base Excess: 3 mmol/L — ABNORMAL HIGH (ref 0.0–2.0)
Acid-Base Excess: 4 mmol/L — ABNORMAL HIGH (ref 0.0–2.0)
Bicarbonate: 30.1 mmol/L — ABNORMAL HIGH (ref 20.0–28.0)
Bicarbonate: 28.6 mmol/L — ABNORMAL HIGH (ref 20.0–28.0)
Calcium, Ion: 0.9 mmol/L — ABNORMAL LOW (ref 1.15–1.40)
Calcium, Ion: 0.88 mmol/L — CL (ref 1.15–1.40)
HCT: 28 % — ABNORMAL LOW (ref 36.0–46.0)
HCT: 31 % — ABNORMAL LOW (ref 36.0–46.0)
Hemoglobin: 9.5 g/dL — ABNORMAL LOW (ref 12.0–15.0)
Hemoglobin: 10.5 g/dL — ABNORMAL LOW (ref 12.0–15.0)
O2 Saturation: 96 %
O2 Saturation: 95 %
Potassium: 4 mmol/L (ref 3.5–5.1)
Potassium: 4.2 mmol/L (ref 3.5–5.1)
Sodium: 130 mmol/L — ABNORMAL LOW (ref 135–145)
Sodium: 130 mmol/L — ABNORMAL LOW (ref 135–145)
TCO2: 32 mmol/L (ref 22–32)
TCO2: 30 mmol/L (ref 22–32)
pCO2 arterial: 56.6 mmHg — ABNORMAL HIGH (ref 32–48)
pCO2 arterial: 44.2 mmHg (ref 32–48)
pH, Arterial: 7.334 — ABNORMAL LOW (ref 7.35–7.45)
pH, Arterial: 7.419 (ref 7.35–7.45)
pO2, Arterial: 90 mmHg (ref 83–108)
pO2, Arterial: 76 mmHg — ABNORMAL LOW (ref 83–108)

## 2022-04-09 LAB — GLUCOSE, CAPILLARY
Glucose-Capillary: 110 mg/dL — ABNORMAL HIGH (ref 70–99)
Glucose-Capillary: 115 mg/dL — ABNORMAL HIGH (ref 70–99)
Glucose-Capillary: 124 mg/dL — ABNORMAL HIGH (ref 70–99)
Glucose-Capillary: 69 mg/dL — ABNORMAL LOW (ref 70–99)
Glucose-Capillary: 78 mg/dL (ref 70–99)
Glucose-Capillary: 88 mg/dL (ref 70–99)
Glucose-Capillary: 93 mg/dL (ref 70–99)

## 2022-04-09 LAB — ECHOCARDIOGRAM COMPLETE
AR max vel: 2.62 cm2
AV Area VTI: 2.79 cm2
AV Area mean vel: 2.29 cm2
AV Mean grad: 8 mmHg
AV Peak grad: 14.1 mmHg
Ao pk vel: 1.88 m/s
Area-P 1/2: 3.34 cm2
Calc EF: 64.8 %
Height: 64 in
S' Lateral: 2.8 cm
Single Plane A2C EF: 67.2 %
Single Plane A4C EF: 58.3 %
Weight: 2532.64 oz

## 2022-04-09 LAB — MAGNESIUM: Magnesium: 2.2 mg/dL (ref 1.7–2.4)

## 2022-04-09 LAB — CBC
HCT: 26.8 % — ABNORMAL LOW (ref 36.0–46.0)
Hemoglobin: 8.5 g/dL — ABNORMAL LOW (ref 12.0–15.0)
MCH: 28.8 pg (ref 26.0–34.0)
MCHC: 31.7 g/dL (ref 30.0–36.0)
MCV: 90.8 fL (ref 80.0–100.0)
Platelets: 153 10*3/uL (ref 150–400)
RBC: 2.95 MIL/uL — ABNORMAL LOW (ref 3.87–5.11)
RDW: 19.2 % — ABNORMAL HIGH (ref 11.5–15.5)
WBC: 10.6 10*3/uL — ABNORMAL HIGH (ref 4.0–10.5)
nRBC: 0 % (ref 0.0–0.2)

## 2022-04-09 LAB — LACTIC ACID, PLASMA
Lactic Acid, Venous: 0.7 mmol/L (ref 0.5–1.9)
Lactic Acid, Venous: 1.2 mmol/L (ref 0.5–1.9)

## 2022-04-09 LAB — HEPARIN LEVEL (UNFRACTIONATED)
Heparin Unfractionated: <0.1 IU/mL — ABNORMAL LOW (ref 0.30–0.70)
Heparin Unfractionated: 0.2 IU/mL — ABNORMAL LOW (ref 0.30–0.70)

## 2022-04-09 LAB — RENAL FUNCTION PANEL
Albumin: 2.5 g/dL — ABNORMAL LOW (ref 3.5–5.0)
Anion gap: 15 (ref 5–15)
BUN: 24 mg/dL — ABNORMAL HIGH (ref 6–20)
CO2: 28 mmol/L (ref 22–32)
Calcium: 6.9 mg/dL — ABNORMAL LOW (ref 8.9–10.3)
Chloride: 90 mmol/L — ABNORMAL LOW (ref 98–111)
Creatinine, Ser: 4.97 mg/dL — ABNORMAL HIGH (ref 0.44–1.00)
GFR, Estimated: 10 mL/min — ABNORMAL LOW (ref >60)
Glucose, Bld: 138 mg/dL — ABNORMAL HIGH (ref 70–99)
Phosphorus: 3.5 mg/dL (ref 2.5–4.6)
Potassium: 4 mmol/L (ref 3.5–5.1)
Sodium: 133 mmol/L — ABNORMAL LOW (ref 135–145)

## 2022-04-09 LAB — CALCIUM
Calcium: 7.1 mg/dL — ABNORMAL LOW (ref 8.9–10.3)
Calcium: 7.7 mg/dL — ABNORMAL LOW (ref 8.9–10.3)

## 2022-04-09 LAB — CALCIUM, IONIZED: Calcium, Ionized, Serum: 4.1 mg/dL — ABNORMAL LOW (ref 4.5–5.6)

## 2022-04-09 LAB — PHENYTOIN LEVEL, TOTAL: Phenytoin Lvl: 7.3 ug/mL — ABNORMAL LOW (ref 10.0–20.0)

## 2022-04-09 LAB — PHOSPHORUS: Phosphorus: 3.4 mg/dL (ref 2.5–4.6)

## 2022-04-09 LAB — T3: T3, Total: 67 ng/dL — ABNORMAL LOW (ref 71–180)

## 2022-04-09 LAB — ALBUMIN: Albumin: 2.6 g/dL — ABNORMAL LOW (ref 3.5–5.0)

## 2022-04-09 MED ORDER — LABETALOL HCL 5 MG/ML IV SOLN
0.5000 mg/min | Status: DC
  Filled 2022-04-09: qty 80

## 2022-04-09 MED ORDER — CALCIUM GLUCONATE 10 % IV SOLN
4.0000 g | Freq: Four times a day (QID) | INTRAVENOUS | Status: DC
  Filled 2022-04-09 (×2): qty 40

## 2022-04-09 MED ORDER — CALCIUM GLUCONATE-NACL 2-0.675 GM/100ML-% IV SOLN
2.0000 g | Freq: Once | INTRAVENOUS | Status: AC
Start: 2022-04-09 — End: 2022-04-09
  Administered 2022-04-09: 2000 mg via INTRAVENOUS
  Filled 2022-04-09: qty 100

## 2022-04-09 MED ORDER — ADENOSINE 6 MG/2ML IV SOLN
6.0000 mg | Freq: Once | INTRAVENOUS | Status: AC
  Administered 2022-04-09: 6 mg via INTRAVENOUS

## 2022-04-09 MED ORDER — ADENOSINE 6 MG/2ML IV SOLN
INTRAVENOUS | Status: AC
  Filled 2022-04-09: qty 2

## 2022-04-09 MED ORDER — DOXERCALCIFEROL 4 MCG/2ML IV SOLN
12.0000 ug | INTRAVENOUS | Status: DC
  Administered 2022-04-09 – 2022-04-15 (×3): 12 ug via INTRAVENOUS
  Filled 2022-04-09 (×6): qty 6

## 2022-04-09 MED ORDER — CALCIUM CARBONATE ANTACID 1250 MG/5ML PO SUSP
3500.0000 mg | Freq: Three times a day (TID) | ORAL | Status: DC
Start: 2022-04-09 — End: 2022-04-09
  Filled 2022-04-09: qty 35

## 2022-04-09 MED ORDER — PANTOPRAZOLE SODIUM 40 MG IV SOLR
40.0000 mg | INTRAVENOUS | Status: DC
Start: 2022-04-10 — End: 2022-04-11
  Administered 2022-04-10 – 2022-04-11 (×2): 40 mg via INTRAVENOUS
  Filled 2022-04-09 (×2): qty 10

## 2022-04-09 MED ORDER — DEXTROSE 10 % IV SOLN: INTRAVENOUS | Status: AC

## 2022-04-09 MED ORDER — METOCLOPRAMIDE HCL 5 MG/ML IJ SOLN: 10.0000 mg | Freq: Three times a day (TID) | INTRAMUSCULAR | Status: DC

## 2022-04-09 MED ORDER — SODIUM CHLORIDE 0.9 % IV SOLN
4.0000 g | Freq: Four times a day (QID) | INTRAVENOUS | Status: AC
  Administered 2022-04-09 – 2022-04-10 (×2): 4 g via INTRAVENOUS
  Filled 2022-04-09 (×4): qty 40

## 2022-04-09 MED ORDER — CLEVIDIPINE BUTYRATE 0.5 MG/ML IV EMUL
0.0000 mg/h | INTRAVENOUS | Status: DC
  Administered 2022-04-09: 1 mg/h via INTRAVENOUS
  Filled 2022-04-09: qty 50

## 2022-04-09 MED ORDER — HYDRALAZINE HCL 20 MG/ML IJ SOLN
10.0000 mg | INTRAMUSCULAR | Status: DC | PRN
  Administered 2022-04-09: 10 mg via INTRAVENOUS
  Administered 2022-04-09 – 2022-04-12 (×3): 20 mg via INTRAVENOUS
  Administered 2022-04-12 – 2022-04-13 (×3): 10 mg via INTRAVENOUS
  Administered 2022-04-13: 20 mg via INTRAVENOUS
  Filled 2022-04-09 (×9): qty 1

## 2022-04-09 MED ORDER — CALCIUM GLUCONATE-NACL 2-0.675 GM/100ML-% IV SOLN
2.0000 g | Freq: Three times a day (TID) | INTRAVENOUS | Status: DC
  Administered 2022-04-09: 2000 mg via INTRAVENOUS
  Filled 2022-04-09: qty 100

## 2022-04-09 MED ORDER — LORAZEPAM 2 MG/ML IJ SOLN
2.0000 mg | Freq: Once | INTRAMUSCULAR | Status: AC
  Administered 2022-04-09: 2 mg via INTRAVENOUS
  Filled 2022-04-09: qty 1

## 2022-04-09 MED ORDER — BISACODYL 10 MG RE SUPP
10.0000 mg | Freq: Every day | RECTAL | Status: DC
  Administered 2022-04-09 – 2022-04-10 (×2): 10 mg via RECTAL
  Filled 2022-04-09: qty 1

## 2022-04-09 MED ORDER — DILTIAZEM HCL 30 MG PO TABS
60.0000 mg | ORAL_TABLET | Freq: Four times a day (QID) | ORAL | Status: DC
  Administered 2022-04-09 – 2022-04-16 (×24): 60 mg
  Filled 2022-04-09 (×27): qty 2

## 2022-04-09 MED ORDER — CHLORHEXIDINE GLUCONATE CLOTH 2 % EX PADS
6.0000 | MEDICATED_PAD | Freq: Every day | CUTANEOUS | Status: DC
  Administered 2022-04-10: 6 via TOPICAL

## 2022-04-09 MED ORDER — METOPROLOL TARTRATE 5 MG/5ML IV SOLN
2.5000 mg | Freq: Once | INTRAVENOUS | Status: AC
  Administered 2022-04-09: 2.5 mg via INTRAVENOUS

## 2022-04-09 MED ORDER — SODIUM CHLORIDE 0.9% FLUSH: 10.0000 mL | INTRAVENOUS | Status: DC | PRN

## 2022-04-09 MED ORDER — PANTOPRAZOLE SODIUM 40 MG IV SOLR
40.0000 mg | Freq: Once | INTRAVENOUS | Status: AC
  Administered 2022-04-09: 40 mg via INTRAVENOUS
  Filled 2022-04-09: qty 10

## 2022-04-09 MED ORDER — ADENOSINE 6 MG/2ML IV SOLN
12.0000 mg | Freq: Once | INTRAVENOUS | Status: AC
  Administered 2022-04-09: 12 mg via INTRAVENOUS

## 2022-04-09 MED ORDER — SODIUM CHLORIDE 0.9 % IV SOLN
125.0000 mg | Freq: Three times a day (TID) | INTRAVENOUS | Status: DC
  Administered 2022-04-09 – 2022-04-12 (×8): 125 mg via INTRAVENOUS
  Filled 2022-04-09 (×12): qty 2.5

## 2022-04-09 MED ORDER — SODIUM CHLORIDE 0.9% FLUSH
10.0000 mL | Freq: Two times a day (BID) | INTRAVENOUS | Status: DC
  Administered 2022-04-09 – 2022-04-11 (×6): 10 mL
  Administered 2022-04-12: 20 mL
  Administered 2022-04-12 – 2022-04-15 (×7): 10 mL

## 2022-04-09 MED ORDER — METOPROLOL TARTRATE 5 MG/5ML IV SOLN
INTRAVENOUS | Status: AC
  Filled 2022-04-09: qty 5

## 2022-04-09 MED ORDER — LORAZEPAM 2 MG/ML IJ SOLN
1.0000 mg | INTRAMUSCULAR | Status: AC | PRN
  Administered 2022-04-09: 2 mg via INTRAVENOUS
  Administered 2022-04-14: 1 mg via INTRAVENOUS
  Filled 2022-04-09 (×2): qty 1

## 2022-04-09 MED ORDER — DILTIAZEM HCL-DEXTROSE 125-5 MG/125ML-% IV SOLN (PREMIX)
5.0000 mg/h | INTRAVENOUS | Status: AC
  Administered 2022-04-09: 5 mg/h via INTRAVENOUS
  Filled 2022-04-09 (×2): qty 125

## 2022-04-09 NOTE — Progress Notes (Signed)
eLink Physician-Brief Progress Note Patient Name: Ruth Gutierrez DOB: June 02, 1966 MRN: [id number]   Date of Service  04/09/2022  HPI/Events of Note  Blood sugar dropped to 37 mg / dl despite enteral nutrition at goal and she required an amp of D 50 to bring her blood sugar up to 116 mg  / dl.  eICU Interventions  D 10 gtt ordered at 50 ml / hour.        Kerry Kass Rashanna Christiana 04/09/2022, 12:19 AM

## 2022-04-09 NOTE — Progress Notes (Signed)
eLink Physician-Brief Progress Note Patient Name: Ruth Gutierrez DOB: [birth year] MRN: [id number]   Date of Service  04/09/2022  HPI/Events of Note  Patient with agitation and dyssynchrony on the ventilator. Propofol has been restarted.  eICU Interventions  Ativan 2 mg iv x 1 ordered.        Kerry Kass Emanual [place] 04/09/2022, 2:36 AM

## 2022-04-09 NOTE — Progress Notes (Signed)
ANTICOAGULATION CONSULT NOTE  Pharmacy Consult for Heparin Indication: atrial fibrillation  Allergies  Allergen Reactions   Penicillins Itching and Rash    Did it involve swelling of the face/tongue/throat, SOB, or low BP?Y Did it involve sudden or severe rash/hives, skin peeling, or any reaction on the inside of your mouth or nose? Y Did you need to seek medical attention at a hospital or doctor's office? Y When did it last happen?  2016     If all above answers are "NO", may proceed with cephalosporin use. 04/07/22> previously tolerated ceftriaxone, cefepime    Patient Measurements: Height: '5\' 4"'$  (162.6 cm) Weight: 71.8 kg (158 lb 4.6 oz) IBW/kg (Calculated) : 54.7 Heparin Dosing Weight: 70.5 kg  Vital Signs: Temp: 99.5 F (37.5 C) (09/06 1111) Temp Source: Rectal (09/06 1111) BP: 182/74 (09/06 1300) Pulse Rate: 96 (09/06 1200)  Labs: Recent Labs    04/07/22 1246 04/07/22 1308 04/08/22 0302 04/08/22 0838 04/08/22 1458 04/08/22 1657 04/08/22 2015 04/09/22 0407 04/09/22 0808 04/09/22 1314  HGB 9.2*   < >  --  8.1*   < > 8.2*  --  8.5* 9.5*  --   HCT 30.2*   < >  --  25.5*   < > 24.0*  --  26.8* 28.0*  --   PLT 152  --   --  151  --   --   --  153  --   --   HEPARINUNFRC  --   --   --   --   --   --  0.12* <0.10*  --  0.20*  CREATININE 13.34*  --  8.77*  --   --   --   --  4.97*  --   --    < > = values in this interval not displayed.     Estimated Creatinine Clearance: 12.6 mL/min (A) (by C-G formula based on SCr of 4.97 mg/dL (H)).   Medical History: Past Medical History:  Diagnosis Date   Anemia of chronic disease    Arthritis    Deceased-donor kidney transplant    Performed at [facility], April 2010.  Initial ESRD due to HTN nephropathy   Eczema    ESRD (end stage renal disease) ([place])    M/W/F dialysis   FUO (fever of unknown origin) 05/17/2015   GERD (gastroesophageal reflux disease)    Headache(784.0)    History of hyperparathyroidism     Hypertension    Peritonitis ([place]) 10/2019   Shortness of breath    Wears glasses     Assessment: 56 yo female with history of ESRD - HD MWF, HTN, s/p parathyroidectomy, and depression. Renal transplant in 2010 failed. Recent surgery, parathyroidectomy, on 03/18/2022. Episode of afib with RVR during HD on 9/4. No anticoagulation PTA. Pharmacy consulted for IV heparin. No evidence of bleeding on CT 9/5.   CBC stable, Hgb 8.5, PLT 153.   Per RN, no issues with infusion/line/no heparin holds and no s/sx of bleeding. Heparin level 0.2 (subtherapeutic)  Goal of Therapy:  Heparin level 0.3-0.7 units/ml Monitor platelets by anticoagulation protocol: Yes   Plan:  Increase heparin infusion rate to 1650 units/hr Check 8 hr heparin level  Monitor daily CBC and s/sx of bleeding   Francena Hanly, PharmD Pharmacy Resident  04/09/2022 2:42 PM

## 2022-04-09 NOTE — Progress Notes (Signed)
Subjective:  Had to restart propofol overnight due to vent dyssynchrony and agitation. Daughter, mother and one more family member at bedside   ROS: unable to obtain due to poor mental status  Examination  Vital signs in last 24 hours: Temp:  [97.6 F (36.4 C)-99.8 F (37.7 C)] 99.5 F (37.5 C) (09/06 1111) Pulse Rate:  [76-108] 96 (09/06 1200) Resp:  [8-28] 27 (09/06 1300) BP: (92-202)/(54-95) 182/74 (09/06 1300) SpO2:  [76 %-100 %] 76 % (09/06 1200) FiO2 (%):  [40 %-50 %] 50 % (09/06 0337) Weight:  [71.8 kg] 71.8 kg (09/06 0400)  General: lying in bed, NAD RS: intubated Neuro: on propofol '@20'$ , briefly opens eyes but doesn't track examiner, doesn't follow commands, PERLA, corneal reflex intact, cough reflex intact, didn't withdraw to noxious stimuli in all extremities  Basic Metabolic Panel: Recent Labs  Lab 04/06/22 0359 04/06/22 1751 04/07/22 0318 04/07/22 1150 04/07/22 1246 04/07/22 1308 04/07/22 2010 04/08/22 0302 04/08/22 1458 04/08/22 1529 04/08/22 1657 04/08/22 1731 04/08/22 2015 04/09/22 0407 04/09/22 0808  NA 138  --  139  --  137   < >  --  134* 134*  --  134*  --   --  133* 130*  K 4.3  --  5.2*  --  5.9*   < >  --  4.8 3.1*  --  3.4*  --   --  4.0 4.0  CL 98  --  96*  --  98  --   --  93*  --   --   --   --   --  90*  --   CO2 23  --  23  --  19*  --   --  25  --   --   --   --   --  28  --   GLUCOSE 74  --  89  --  83  --   --  154*  --   --   --   --   --  138*  --   BUN 39*  --  47*  --  49*  --   --  36*  --   --   --   --   --  24*  --   CREATININE 10.69*  --  12.06*  --  13.34*  --   --  8.77*  --   --   --   --   --  4.97*  --   CALCIUM 5.4*   < > 6.4*  --  8.8*  --   --  7.8*  --  7.7*  --   --  7.1* 6.9*  --   MG 2.4  --   --   --   --   --  2.1 2.0  --   --   --  1.8  --  2.2  --   PHOS  --   --   --  4.7*  --   --  3.7 3.9  --   --   --  2.6  --  3.4  3.5  --    < > = values in this interval not displayed.    CBC: Recent Labs  Lab  04/06/22 0359 04/07/22 0318 04/07/22 1246 04/07/22 1308 04/08/22 0838 04/08/22 1458 04/08/22 1657 04/09/22 0407 04/09/22 0808  WBC 7.4 9.8 14.8*  --  10.9*  --   --  10.6*  --   HGB 7.8* 8.6* 9.2*   < >  8.1* 8.8* 8.2* 8.5* 9.5*  HCT 23.9* 26.7* 30.2*   < > 25.5* 26.0* 24.0* 26.8* 28.0*  MCV 89.5 90.2 95.9  --  92.1  --   --  90.8  --   PLT 172 179 152  --  151  --   --  153  --    < > = values in this interval not displayed.     Coagulation Studies: No results for input(s): "LABPROT", "INR" in the last 72 hours.  Imaging No new brain imaging   ASSESSMENT AND PLAN:  44 old female with status epilepticus arising from right frontal region in the setting of severe hypercalcemia and prior meningioma resection.   Refractory electrographic/nonconvulsive status epilepticus, resolved Acute encephalopathy - No further seizures overnight   Recommendations -Continue current dose of Keppra, Vimpat and Onfi '5mg'$  daily - Phenytoin corrected level 11.8. Will increase to '125mg'$  Q8h as EEG shows frequent spikes and rhythmicity suggestive of increased risk of seizure recurrence as we wean sedation  -Continue LTM EEG overnight. - will obtain MRI brain tomorrow to look for anoxic-hypoxic brain injury - agree with palliative care consult as patient has significant comorbidities and now s/p cardiac arrest -Management of rest of comorbidities per primary team -Discussed plan with critical care team and family at bedside  I have spent a total of  48  minutes with the patient reviewing hospital notes,  test results, labs and examining the patient as well as establishing an assessment and plan.  > 50% of time was spent in direct patient care.    Zeb Comfort Epilepsy [facility] For questions after 5pm please refer to AMION to reach the Neurologist on call

## 2022-04-09 NOTE — Progress Notes (Addendum)
NAME:  Ruth Gutierrez, MRN:  [id number], DOB:  [birth year], LOS: 4 ADMISSION DATE:  04/06/2022, CONSULTATION DATE:  04/07/2022 REFERRING MD:  Dr. Philipp Ovens, CHIEF COMPLAINT:  Acute Respiratory Failure, IHCA   History of Present Illness:  56 y/o female with history of ESRD s/p renal transplant on HD MWF, HTN, s/p parathyroidectomy, and depression. Admitted to ICU following cardiac arrest due to aspiration caused by seizure.  Now critically ill due to acute hypoxic respiratory failure requiring mechanical ventilation.  Pertinent  Medical History   Past Medical History:  Diagnosis Date   Anemia of chronic disease    Arthritis    Deceased-donor kidney transplant    Performed at [facility], April 2010.  Initial ESRD due to HTN nephropathy   Eczema    ESRD (end stage renal disease) ([place])    M/W/F dialysis   FUO (fever of unknown origin) 05/17/2015   GERD (gastroesophageal reflux disease)    Headache(784.0)    History of hyperparathyroidism    Hypertension    Peritonitis ([place]) 10/2019   Shortness of breath    Wears glasses      Significant Hospital Events: Including procedures, antibiotic start and stop dates in addition to other pertinent events   9/1 - admitted with AMS found to have seizures on EEG 9/3 - NG tube placed 9/4 - gradual increase in oxygen requirements, code blue at 1150, intubated and transferred to ICU. Went into afib with rvr during HD, started amio drip 9/5 - amiodarone stopped, metoprolol started, CT head unchanged no bleed, heparin gtt, remains on vent. After RR increased ABG showed hypoxia and alkalosis. Settings changed. Propofol weaned off, fentanyl added 9/6 - placed back on propofol for agitation and dyssynchrony on the vent overnight  Interim History / Subjective:  Patient again sedated after being dyssynchronous with the vent overnight. She also has PRN hydralazine and D10 gtt for hypertension and hypoglycemia overnight. She appears stable or slightly  worse than yesterday but this is difficult to rely on due to the increased sedation.   Objective   Blood pressure (!) 106/58, pulse 94, temperature 98.6 F (37 C), temperature source Rectal, resp. rate 18, height '5\' 4"'$  (1.626 m), weight 71.8 kg, last menstrual period 06/02/2015, SpO2 100 %.    Vent Mode: PRVC FiO2 (%):  [40 %-50 %] 50 % Set Rate:  [18 bmp-28 bmp] 18 bmp Vt Set:  [430 mL] 430 mL PEEP:  [5 cmH20-8 cmH20] 8 cmH20 Pressure Support:  [10 cmH20] 10 cmH20 Plateau Pressure:  [18 cmH20-27 cmH20] 27 cmH20   Intake/Output Summary (Last 24 hours) at 04/09/2022 0725 Last data filed at 04/09/2022 0600 Gross per 24 hour  Intake 2457.78 ml  Output 3000 ml  Net -542.22 ml   Filed Weights   04/08/22 1200 04/08/22 1635 04/09/22 0400  Weight: 74.4 kg 71.8 kg 71.8 kg    Examination: General: Ill appearing female laying in bed, intubated and sedated HENT: [place]/AT, EEG leads in place Lungs: Ventilated breath sounds bilaterally, CTA Cardiovascular: RRR Abdomen: soft, diminished BS Extremities: Minimal LE edema, palpable radial and pedal pulses bilaterally Neuro: Sedated RASS -5, sluggish but reactive pupils, cough reflex intact, no gag reflex, abnormal oculocephalic reflex, no response to painful stimuli  Resolved Hospital Problem list     Assessment & Plan:  Acute Respiratory Failure ARDS 2/2 aspiration IHCA  Pt had a seizure, agonal breathing, and lost a pulse on 9/4 with ROSC after 19 minutes of CPR. Intubated and sedated. CXR with diffuse  infiltrates. TTM orders in if needed, patient has been normothermic after short period of fevers on 9/4. Worsening hypoxemia, PEEP/FiO2 increased, SpO2 remains normal. - Continue vent management and attempt to wean sedation as possible - Repeat ABG this AM mild respiratory acidosis, rate to 22. - continue ceftriaxone  - We will continue to re-evaluate as we are able to wean sedation.    Status Epilepticus Suspected bifrontal region origin. On  propofol, phenytoin, keppra, and vimpat. Neurology managing. No seizures on EEG overnight.  - Continue EEG and medication per neurology - may need repeat imaging if no improvements are seen as we wean sedation   Hypocalcemia Post Parathyroidectomy Calcium of 5.5, ionized 0.62 on admission. Responded well to aggressive repletion. Corrected calcium 8.1, ionized 0.9 today.  - Continue with repletion  - Will need high Ca++ bath for outpatient dialysis  Acid-Base Imbalance Gap of 20 on transfer with bicarb of 19. Delta delta originally showed pure AGMA with lactate of 6.9. ABG showed pH of 7.248 with elevated pCO2 and bicarb of 21, RR increased and as sedation was weaned patient was breathing over the vent. Repeat ABG pH of 7.568, low pO2, normal pCO2, and elevated bicarb, RR decreased, PEEP and FiO2 increased. ABG pH 7.494, low pO2, normal pCO2, and elevated bicarb. Gap closing but remains at 15 today. Pt on calcium carbonate.  - Repeat ABG this am with pH 7.335, pCO2 56, pO2 90, bicarb 30 - Vent changes as above, continue calcium repletion - Repeat lactic acid   ESRD on HD MWF Renal transplant in 2010, failed. Episode of Afib with RVR during HD on 9/4, prematurely ended HD. - Continue HD per nephrology  EKG Abnormalities ST elevations seen on telemetry consistently today. Unable to assess for symptoms, hemodynamically stable, troponin likely elevated post CPR. EKG showed possible PR depression and biphasic P waves in V1 and V2. Question of inverted T waves in II/III. Echo in 10/2020 showed moderate concentric LVH with EF of 60-65%, mild left atrial dilation, mild elevation in pulmonary artery pressure, moderate TR, and right atrial pressure of 3 mmHg.  - Echo for further evaluation post arrest    Afib w/ RVR Episode during HD on 9/4. Amiodarone gtt started. Has been turned off a few times for bradycardia. On cleviprex gtt. Stable this am. Head CT without acute bleed. - continue metoprolol  -  heparin gtt   Hyperthyroidism TSH undetectable with elevated free T4 and normal T3 initially, now normal free T4 and low T3. This pattern likely represents subclinical hyperthyroidism with some transient effects of amiodarone. As we could not do a RAIU scan due to recent amiodarone bolus and with no reported hyperthyroid symptoms prior to admission we elected not to treat. Will consider further workup and management if new symptoms develop. Pending TSH ab result.    Hypertension Previously on amlodipine 10 mg daily. Has been on cleviprex gtt. BB as above. We will continue to monitor. - PRN hydralazine  Hypoglycemia Pt was hypoglycemic overnight and d10 gtt was added, will continue as needed.    Advanced Care Planning Pt is critically ill with unknown prognosis. We will consider palliative consult in the event she is not showing expected improvements.    Best Practice (right click and "Reselect all SmartList Selections" daily)    Diet/type: tubefeeds DVT prophylaxis: systemic heparin GI prophylaxis: PPI Lines: Central line Foley:  N/A Code Status:  full code Last date of multidisciplinary goals of care discussion [9/5]  Labs   CBC:  Recent Labs  Lab 04/06/22 0359 04/07/22 0318 04/07/22 1246 04/07/22 1308 04/08/22 0838 04/08/22 1458 04/08/22 1657 04/09/22 0407  WBC 7.4 9.8 14.8*  --  10.9*  --   --  10.6*  HGB 7.8* 8.6* 9.2* 9.5* 8.1* 8.8* 8.2* 8.5*  HCT 23.9* 26.7* 30.2* 28.0* 25.5* 26.0* 24.0* 26.8*  MCV 89.5 90.2 95.9  --  92.1  --   --  90.8  PLT 172 179 152  --  151  --   --  831    Basic Metabolic Panel: Recent Labs  Lab 04/06/22 0359 04/06/22 1751 04/07/22 0318 04/07/22 1150 04/07/22 1246 04/07/22 1308 04/07/22 2010 04/08/22 0302 04/08/22 1458 04/08/22 1529 04/08/22 1657 04/08/22 1731 04/08/22 2015 04/09/22 0407  NA 138  --  139  --  137 134*  --  134* 134*  --  134*  --   --  133*  K 4.3  --  5.2*  --  5.9* 5.9*  --  4.8 3.1*  --  3.4*  --   --  4.0   CL 98  --  96*  --  98  --   --  93*  --   --   --   --   --  90*  CO2 23  --  23  --  19*  --   --  25  --   --   --   --   --  28  GLUCOSE 74  --  89  --  83  --   --  154*  --   --   --   --   --  138*  BUN 39*  --  47*  --  49*  --   --  36*  --   --   --   --   --  24*  CREATININE 10.69*  --  12.06*  --  13.34*  --   --  8.77*  --   --   --   --   --  4.97*  CALCIUM 5.4*   < > 6.4*  --  8.8*  --   --  7.8*  --  7.7*  --   --  7.1* 6.9*  MG 2.4  --   --   --   --   --  2.1 2.0  --   --   --  1.8  --  2.2  PHOS  --   --   --  4.7*  --   --  3.7 3.9  --   --   --  2.6  --  3.4  3.5   < > = values in this interval not displayed.   GFR: Estimated Creatinine Clearance: 12.6 mL/min (A) (by C-G formula based on SCr of 4.97 mg/dL (H)). Recent Labs  Lab 04/07/22 0318 04/07/22 1246 04/07/22 1527 04/07/22 2010 04/08/22 0838 04/09/22 0407  WBC 9.8 14.8*  --   --  10.9* 10.6*  LATICACIDVEN  --  6.9* 1.7 2.3*  --   --     Liver Function Tests: Recent Labs  Lab 05/02/2022 2019 04/05/22 0219 04/07/22 0318 04/07/22 1246 04/08/22 0302 04/09/22 0407  AST 19 16 13* 116*  --   --   ALT '19 17 13 '$ 90*  --   --   ALKPHOS 648* 613* 573* 650*  --   --   BILITOT 0.8 0.7 0.6 0.7  --   --   PROT 7.3 6.5  6.5 6.9  --   --   ALBUMIN 2.8* 2.7* 2.6* 2.6* 2.1* 2.5*   No results for input(s): "LIPASE", "AMYLASE" in the last 168 hours. No results for input(s): "AMMONIA" in the last 168 hours.  ABG    Component Value Date/Time   PHART 7.494 (H) 04/08/2022 1657   PCO2ART 40.9 04/08/2022 1657   PO2ART 52 (L) 04/08/2022 1657   HCO3 31.6 (H) 04/08/2022 1657   TCO2 33 (H) 04/08/2022 1657   ACIDBASEDEF 6.0 (H) 04/07/2022 1308   O2SAT 90 04/08/2022 1657     Coagulation Profile: Recent Labs  Lab 04/05/22 0219  INR 1.5*    Cardiac Enzymes: No results for input(s): "CKTOTAL", "CKMB", "CKMBINDEX", "TROPONINI" in the last 168 hours.  HbA1C: Hemoglobin A1C  Date/Time Value Ref Range Status   12/20/2020 10:01 AM 5.0 4.0 - 5.6 % Final    CBG: Recent Labs  Lab 04/08/22 1520 04/08/22 1933 04/08/22 2318 04/08/22 2347 04/09/22 0323  GLUCAP 82 107* 37* 116* 78    Review of Systems:   Sedated.  Past Medical History:  She,  has a past medical history of Anemia of chronic disease, Arthritis, Deceased-donor kidney transplant, Eczema, ESRD (end stage renal disease) ([place]), FUO (fever of unknown origin) (05/17/2015), GERD (gastroesophageal reflux disease), Headache(784.0), History of hyperparathyroidism, Hypertension, Peritonitis ([place]) (10/2019), Shortness of breath, and Wears glasses.   Surgical History:   Past Surgical History:  Procedure Laterality Date   A/V FISTULAGRAM Left 02/12/2017   Procedure: A/V Fistulagram;  Surgeon: Algernon Huxley, MD;  Location: [place] [facility];  Service: Cardiovascular;  Laterality: Left;   A/V FISTULAGRAM Left 12/30/2017   Procedure: A/V FISTULAGRAM;  Surgeon: Algernon Huxley, MD;  Location: [place] [facility];  Service: Cardiovascular;  Laterality: Left;   A/V FISTULAGRAM Left 04/01/2021   Procedure: A/V FISTULAGRAM;  Surgeon: Algernon Huxley, MD;  Location: [place] [facility];  Service: Cardiovascular;  Laterality: Left;   A/V SHUNT INTERVENTION N/A 02/12/2017   Procedure: A/V Shunt Intervention;  Surgeon: Algernon Huxley, MD;  Location: [place] [facility];  Service: Cardiovascular;  Laterality: N/A;   AV FISTULA PLACEMENT     BASCILIC VEIN TRANSPOSITION Left 10/30/2014   Procedure: LEFT [place];  Surgeon: Rosetta Posner, MD;  Location: [place];  Service: Vascular;  Laterality: Left;   [place] Left 01/03/2015   Procedure: LEFT ARM 2ND STAGE [place];  Surgeon: Rosetta Posner, MD;  Location: [place];  Service: Vascular;  Laterality: Left;   BIOPSY  10/23/2021   Procedure: BIOPSY;  Surgeon: Jackquline Denmark, MD;  [facility] END[facility]COPY;  Service: Gastroenterology;;   BREAST BIOPSY Left    Patient  doesn't remember any information from previous procedure.   CAPD REMOVAL N/A 10/31/2019   Procedure: PERITONEAL DIALYSIS  (CAPD) INFECTED CATHETER REMOVAL;  Surgeon: Coralie Keens, MD;  Location: [place];  Service: General;  Laterality: N/A;   CRANIOTOMY N/A 11/01/2020   Procedure: CRANIOTOMY FOR TUMOR EXCISION;  Surgeon: [name], MD;  Location: [place];  Service: Neurosurgery;  Laterality: N/A;   ESOPHAGOGASTRODUODENOSCOPY (EGD) WITH PROPOFOL N/A 10/23/2021   Procedure: ESOPHAGOGASTRODUODENOSCOPY (EGD) WITH PROPOFOL;  Surgeon: Jackquline Denmark, MD;  Location: [place];  Service: Gastroenterology;  Laterality: N/A;   FRACTURE SURGERY     left foot,baby toe nad next toe missing   INSERTION OF DIALYSIS CATHETER Right 10/30/2014   Procedure: INSERTION OF DIALYSIS CATHETER;  Surgeon: Rosetta Posner, MD;  Location:  [place] OR;  Service: Vascular;  Laterality: Right;   KIDNEY TRANSPLANT  11/02/2008   [facility])   PARATHYROIDECTOMY N/A 03/18/2022   Procedure: TOTAL PARATHYROIDECTOMY;  Surgeon: Armandina Gemma, MD;  Location: [place];  Service: General;  Laterality: N/A;   PLACEMENT OF LUMBAR DRAIN N/A 11/01/2020   Procedure: PLACEMENT OF LUMBAR DRAIN;  Surgeon: [name], MD;  Location: [place];  Service: Neurosurgery;  Laterality: N/A;   WISDOM TOOTH EXTRACTION       Social History:   reports that she has been smoking cigarettes. She has a 13.50 pack-year smoking history. She has never used smokeless tobacco. She reports that she does not drink alcohol and does not use drugs.   Family History:  Her family history includes Deep vein thrombosis in her brother; Diabetes in her brother; Hyperlipidemia in her sister; Hypertension in her father, mother, sister, and sister; Kidney disease in her brother. There is no history of Colon cancer, Esophageal cancer, Rectal cancer, or Breast cancer.   Allergies Allergies  Allergen Reactions   Penicillins Itching and Rash    Did it involve  swelling of the face/tongue/throat, SOB, or low BP?Y Did it involve sudden or severe rash/hives, skin peeling, or any reaction on the inside of your mouth or nose? Y Did you need to seek medical attention at a hospital or doctor's office? Y When did it last happen?  2016     If all above answers are "NO", may proceed with cephalosporin use. 04/07/22> previously tolerated ceftriaxone, cefepime     Home Medications  Prior to Admission medications   Medication Sig Start Date End Date Taking? Authorizing Provider  acetaminophen (TYLENOL) 500 MG tablet Take 500 mg by mouth every 6 (six) hours as needed.   Yes [provider]  albuterol (VENTOLIN HFA) 108 (90 Base) MCG/ACT inhaler Inhale 3 puffs into the lungs daily as needed for wheezing or shortness of breath. 11/12/21 04/05/22 Yes Gaylan Gerold, DO  amLODipine (NORVASC) 10 MG tablet Take 1 tablet (10 mg total) by mouth daily. 07/02/21  Yes Lajean Manes, MD  cloNIDine (CATAPRES) 0.1 MG tablet Take 0.1 mg by mouth 2 (two) times daily. 04/01/22  Yes [provider]  diclofenac Sodium (VOLTAREN) 1 % GEL Apply 2 g topically 2 (two) times daily as needed (Right shoulder pain). Patient taking differently: Apply 1 Application topically daily as needed (Right shoulder pain). 01/20/22  Yes Gaylan Gerold, DO  diphenhydrAMINE (BENADRYL) 25 mg capsule Take 50 mg by mouth 3 (three) times daily as needed for allergies.   Yes [provider]  lidocaine-prilocaine (EMLA) cream Apply 1 application. topically every Monday, Wednesday, and Friday with hemodialysis. 02/01/20  Yes [provider]  sevelamer carbonate (RENVELA) 800 MG tablet Take 1,600 mg by mouth 3 (three) times daily with meals.   Yes [provider]  calcitRIOL (ROCALTROL) 0.5 MCG capsule Take 4 capsules (2 mcg total) by mouth 2 (two) times daily between meals. Patient not taking: Reported on 04/05/2022 03/31/22   Sanjuan Dame, MD  Calcium Carbonate Antacid  (CALCIUM CARBONATE, DOSED IN MG ELEMENTAL CALCIUM,) 1250 MG/5ML SUSP Take 24 mLs (2,400 mg of elemental calcium total) by mouth 4 (four) times daily. Patient not taking: Reported on 04/05/2022 03/31/22   Sanjuan Dame, MD  pantoprazole (PROTONIX) 40 MG tablet Take 1 tablet (40 mg total) by mouth 2 (two) times daily before a meal. Patient not taking: Reported on 03/19/2022 10/24/21 02/13/22  Rosezetta Schlatter, MD     Critical  care time: 59

## 2022-04-09 NOTE — Progress Notes (Signed)
OT Cancellation Note  Patient Details Name: Ruth Gutierrez MRN: [id number] DOB: 08/12/1965   Cancelled Treatment:    Reason Eval/Treat Not Completed: Patient not medically ready- pt remains intubated and sedated, OT will sign off.  Please re-consult when appropriate.   Jolaine Artist, OT Acute Rehabilitation Services Office [phone]   [name] 04/09/2022, 8:29 AM

## 2022-04-09 NOTE — Progress Notes (Signed)
ANTICOAGULATION CONSULT NOTE   Pharmacy Consult for Heparin Indication: atrial fibrillation  Allergies  Allergen Reactions   Penicillins Itching and Rash    Did it involve swelling of the face/tongue/throat, SOB, or low BP?Y Did it involve sudden or severe rash/hives, skin peeling, or any reaction on the inside of your mouth or nose? Y Did you need to seek medical attention at a hospital or doctor's office? Y When did it last happen?  2016     If all above answers are "NO", may proceed with cephalosporin use. 04/07/22> previously tolerated ceftriaxone, cefepime    Patient Measurements: Height: '5\' 4"'$  (162.6 cm) Weight: 71.8 kg (158 lb 4.6 oz) IBW/kg (Calculated) : 54.7 Heparin Dosing Weight: 70.5 kg  Vital Signs: Temp: 98.4 F (36.9 C) (09/06 0400) Temp Source: Rectal (09/06 0400) BP: 130/75 (09/06 0500) Pulse Rate: 95 (09/06 0500)  Labs: Recent Labs    04/07/22 0318 04/07/22 1246 04/07/22 1308 04/08/22 0302 04/08/22 0838 04/08/22 1458 04/08/22 1657 04/08/22 2015 04/09/22 0407  HGB 8.6* 9.2*   < >  --  8.1* 8.8* 8.2*  --  8.5*  HCT 26.7* 30.2*   < >  --  25.5* 26.0* 24.0*  --  26.8*  PLT 179 152  --   --  151  --   --   --  153  HEPARINUNFRC  --   --   --   --   --   --   --  0.12* <0.10*  CREATININE 12.06* 13.34*  --  8.77*  --   --   --   --   --    < > = values in this interval not displayed.     Estimated Creatinine Clearance: 7.1 mL/min (A) (by C-G formula based on SCr of 8.77 mg/dL (H)).   Medical History: Past Medical History:  Diagnosis Date   Anemia of chronic disease    Arthritis    Deceased-donor kidney transplant    Performed at [facility], April 2010.  Initial ESRD due to HTN nephropathy   Eczema    ESRD (end stage renal disease) ([place])    M/W/F dialysis   FUO (fever of unknown origin) 05/17/2015   GERD (gastroesophageal reflux disease)    Headache(784.0)    History of hyperparathyroidism    Hypertension    Peritonitis ([place]) 10/2019    Shortness of breath    Wears glasses     Assessment: 56 yo female with history of ESRD - HD MWF, HTN, s/p parathyroidectomy, and depression. Renal transplant in 2010 failed. Recent surgery, parathyroidectomy, on 03/18/2022. Episode of afib with RVR during HD on 9/4. No anticoagulation PTA. Pharmacy consulted for IV heparin. No evidence of bleeding on CT 9/5.   Heparin level undetectable after rate increase yesterday evening. Per [facility] record the heparin infusion was paused, however RN confirms heparin has been infusing her entire shift without issue. No overt s/sx of bleeding. Hgb remains low but stable.  Goal of Therapy:  Heparin level 0.3-0.7 units/ml Monitor platelets by anticoagulation protocol: Yes   Plan:  Increase heparin infusion rate to 1500 units/hr Check 8 hr heparin level '@1300'$  Monitor daily CBC and s/sx of bleeding   Georga Bora, PharmD Clinical Pharmacist 04/09/2022 5:07 AM Please check AMION for all [place] numbers

## 2022-04-09 NOTE — Progress Notes (Signed)
eLink Physician-Brief Progress Note Patient Name: Ruth Gutierrez DOB: 10/23/1965 MRN: [id number]   Date of Service  04/09/2022  HPI/Events of Note  BP 184/86, HR 77  eICU Interventions  PRN IV Hydralazine ordered for SBP > 160 mmHg.        Alyda Megna U Anjanae Woehrle 04/09/2022, 1:24 AM

## 2022-04-09 NOTE — Progress Notes (Addendum)
eLink Physician-Brief Progress Note Patient Name: Ruth Gutierrez DOB: [birth year] MRN: [id number]   Date of Service  04/09/2022  HPI/Events of Note  Patient with agitation and ventilator dyssynchrony.  eICU Interventions  Ativan 1-2 mg iv Q 4 hours PRN x 2 doses ordered.        Kerry Kass [name] 04/09/2022, 11:45 PM

## 2022-04-09 NOTE — Progress Notes (Signed)
[facility] Progress Note  Subjective: had HD yest w/ 3 L off. Wts down 71.8kg.   Vitals:   04/09/22 0500 04/09/22 0530 04/09/22 0600 04/09/22 0752  BP: 130/75 (!) 153/88 (!) 106/58   Pulse: 95 94 94   Resp: 12 (!) 8 18   Temp:   98.6 F (37 C) 98.6 F (37 C)  TempSrc:   Rectal Rectal  SpO2: 99% 100% 100%   Weight:      Height:        Exam: Gen on vent, sedated No rash, cyanosis or gangrene Sclera anicteric, throat w/ ETT No jvd or bruits Chest clear anterior/ lateral RRR no MRG Abd soft ntnd no mass or ascites +bs GU defer MS no joint effusions or deformity Ext no sig edema Neuro is on vent, sedated   LUE AVF +bruit    OP HD: [facility] M[facility]  4h  65kg  2/3 bath  LUE AVF  Hep none - hectorol 4 u g tiw - venofer '100mg'$   - mircera 225 q 2, last 8/30    Summary: 56 y.o. female ESRD, h/o transplant, PTX, HTN, depression p/w AMS.  Rx only 1hr38mn of HD on 9/1 bec of lethargy and confusion  noted to be somnolent and initially thought that Ativan was contributing. Pt has been confused for 1-2 days; recently had a parathyroidecomy on 8/15 and was treated for hypocalcemia subsequently. Her regimen was supposed to be  liquid CaCO3 '2400mg'$  suspension 4x daily but according to the mother she did not tolerate this in the hospital last admission and it was confirmed with mother and daughter (lives w/ daughter) she was not taking any Ca at home.  Ca noted to be 5.5 in the ED with a pCO2 of 18. Patient had facial twitching in the ED with subsequent MRI of the brain showed gyriform diffusion restriction of the anterior right frontal lob suggestive of acute seizure/status epilepticus. EEG confirmed that it was coming from the right frontal lobe. Pt admitted, we were asked to see for ESRD.    Assessment/ Plan: Cardiac arrest - on 9/4 VDRF - w/ bilat infiltrates by CXR, pulm edema +/- aspiration pneumonitis.  Volume - up multiple kg by wts and pulm edema by CXR. 3 L off yest w/ HD.  BP's better, plan daily HD until vol excess resolved. CXR pend today.  ESRD - cont HD MWF. HD today as above.  Hypocalcemia/ hungry bone syndrome sp recent total parathyroidectomy 8/15. Pt dc'd on CaCO3 2400 elemental qid, but per the family she was not taking any calcium at home. Pt here now w/ seizures and hypocalcemia. We restarted her po CaCO3 here but she was not taking po reliably so NG tube was placed. Got tid IV Ca gluc boluses 4 gm x 3 on 9/4, no IV yesterday.  - no high Ca++ bath available here, but will need at dc  - Ca 6.9 today, corr Ca approx 8 - will hold po CaCO3 and calcitriol as she is not tolerating ng tf's and will IV Ca gluc 4 gm x 3 today - cont tid serum Ca++ levels   HTN - BP's up/ better today. Getting metoprolol 25 bid Seizures - seen by neuro -> arising from right frontal region + severe diffuse encephalopathy.  Anemia esrd -Hgb 8's, last op mircera 2232m on 8/30, next esa due 9/13.  Secondary hyperparathyroidism -see as above hypocalcemia. Binders are on hold post PTX and phos is stable.  History of meningioma -  status post resection 11/01/2020  Kelly Splinter 04/09/2022, 7:54 AM   Recent Labs  Lab 04/08/22 0302 04/08/22 0838 04/08/22 1657 04/08/22 1731 04/08/22 2015 04/09/22 0407  HGB  --    < > 8.2*  --   --  8.5*  ALBUMIN 2.1*  --   --   --   --  2.5*  CALCIUM 7.8*   < >  --   --  7.1* 6.9*  PHOS 3.9  --   --  2.6  --  3.4  3.5  CREATININE 8.77*  --   --   --   --  4.97*  K 4.8   < > 3.4*  --   --  4.0   < > = values in this interval not displayed.    No results for input(s): "IRON", "TIBC", "FERRITIN" in the last 168 hours. Inpatient medications:  acetaminophen  650 mg Per Tube Q4H   Or   acetaminophen (TYLENOL) oral liquid 160 mg/5 mL  650 mg Per Tube Q4H   Or   acetaminophen  650 mg Rectal Q4H   calcitRIOL  2 mcg Per Tube BID BM   calcium carbonate (dosed in mg elemental calcium)  2,500 mg of elemental calcium Per Tube TID   Chlorhexidine  Gluconate Cloth  6 each Topical Q0600   Chlorhexidine Gluconate Cloth  6 each Topical Q0600   cloBAZam  5 mg Per Tube Daily   docusate  100 mg Per Tube BID   feeding supplement (PROSource TF20)  60 mL Per Tube Daily   levETIRAcetam  1,000 mg Per Tube Daily   levETIRAcetam  500 mg Per Tube Once per day on Mon Wed Fri   metoprolol tartrate  25 mg Per Tube BID   mouth rinse  15 mL Mouth Rinse Q2H   pantoprazole  40 mg Per Tube Daily   phenytoin (DILANTIN) IV  100 mg Intravenous Q8H   polyethylene glycol  17 g Per Tube Daily   sodium chloride flush  10-40 mL Intracatheter Q12H    cefTRIAXone (ROCEPHIN)  IV Stopped (04/08/22 1402)   dextrose 50 mL/hr at 04/09/22 0600   feeding supplement (VITAL 1.5 CAL) 50 mL/hr at 04/09/22 0600   fentaNYL infusion INTRAVENOUS 100 mcg/hr (04/09/22 0600)   heparin 1,500 Units/hr (04/09/22 0600)   lacosamide (VIMPAT) IV Stopped (04/08/22 2151)   levETIRAcetam     levETIRAcetam     norepinephrine (LEVOPHED) Adult infusion Stopped (04/08/22 1530)   propofol (DIPRIVAN) infusion 35 mcg/kg/min (04/09/22 0600)   albuterol, diclofenac Sodium, fentaNYL, hydrALAZINE, metoprolol tartrate, mouth rinse, sodium chloride flush

## 2022-04-09 NOTE — Progress Notes (Signed)
ABG collected at 12:40 and run on istat but not showing up in Epic  7.41/44.2/76/28.6 Dr Tamala Julian made aware

## 2022-04-10 ENCOUNTER — Inpatient Hospital Stay (HOSPITAL_COMMUNITY): Payer: Medicare Other

## 2022-04-10 DIAGNOSIS — Z86018 Personal history of other benign neoplasm: Secondary | ICD-10-CM | POA: Diagnosis not present

## 2022-04-10 DIAGNOSIS — Z9889 Other specified postprocedural states: Secondary | ICD-10-CM

## 2022-04-10 DIAGNOSIS — G40901 Epilepsy, unspecified, not intractable, with status epilepticus: Secondary | ICD-10-CM | POA: Diagnosis not present

## 2022-04-10 DIAGNOSIS — R4182 Altered mental status, unspecified: Secondary | ICD-10-CM | POA: Diagnosis not present

## 2022-04-10 DIAGNOSIS — G9341 Metabolic encephalopathy: Secondary | ICD-10-CM | POA: Diagnosis not present

## 2022-04-10 LAB — GLUCOSE, CAPILLARY
Glucose-Capillary: 106 mg/dL — ABNORMAL HIGH (ref 70–99)
Glucose-Capillary: 110 mg/dL — ABNORMAL HIGH (ref 70–99)
Glucose-Capillary: 124 mg/dL — ABNORMAL HIGH (ref 70–99)
Glucose-Capillary: 134 mg/dL — ABNORMAL HIGH (ref 70–99)
Glucose-Capillary: 70 mg/dL (ref 70–99)
Glucose-Capillary: 76 mg/dL (ref 70–99)
Glucose-Capillary: 80 mg/dL (ref 70–99)

## 2022-04-10 LAB — RENAL FUNCTION PANEL
Albumin: 2.4 g/dL — ABNORMAL LOW (ref 3.5–5.0)
Anion gap: 11 (ref 5–15)
BUN: 18 mg/dL (ref 6–20)
CO2: 30 mmol/L (ref 22–32)
Calcium: 7.5 mg/dL — ABNORMAL LOW (ref 8.9–10.3)
Chloride: 91 mmol/L — ABNORMAL LOW (ref 98–111)
Creatinine, Ser: 3.64 mg/dL — ABNORMAL HIGH (ref 0.44–1.00)
GFR, Estimated: 14 mL/min — ABNORMAL LOW (ref >60)
Glucose, Bld: 105 mg/dL — ABNORMAL HIGH (ref 70–99)
Phosphorus: 3.7 mg/dL (ref 2.5–4.6)
Potassium: 3.9 mmol/L (ref 3.5–5.1)
Sodium: 132 mmol/L — ABNORMAL LOW (ref 135–145)

## 2022-04-10 LAB — CBC
HCT: 28.9 % — ABNORMAL LOW (ref 36.0–46.0)
Hemoglobin: 9 g/dL — ABNORMAL LOW (ref 12.0–15.0)
MCH: 29 pg (ref 26.0–34.0)
MCHC: 31.1 g/dL (ref 30.0–36.0)
MCV: 93.2 fL (ref 80.0–100.0)
Platelets: 170 10*3/uL (ref 150–400)
RBC: 3.1 MIL/uL — ABNORMAL LOW (ref 3.87–5.11)
RDW: 19.6 % — ABNORMAL HIGH (ref 11.5–15.5)
WBC: 11.5 10*3/uL — ABNORMAL HIGH (ref 4.0–10.5)
nRBC: 0.2 % (ref 0.0–0.2)

## 2022-04-10 LAB — HEPARIN LEVEL (UNFRACTIONATED)
Heparin Unfractionated: 0.36 IU/mL (ref 0.30–0.70)
Heparin Unfractionated: 0.4 IU/mL (ref 0.30–0.70)

## 2022-04-10 LAB — CALCIUM
Calcium: 7.5 mg/dL — ABNORMAL LOW (ref 8.9–10.3)
Calcium: 7.5 mg/dL — ABNORMAL LOW (ref 8.9–10.3)

## 2022-04-10 LAB — THYROID STIMULATING IMMUNOGLOBULIN: Thyroid Stimulating Immunoglob: <0.1 IU/L (ref 0.00–0.55)

## 2022-04-10 MED ORDER — CHLORHEXIDINE GLUCONATE CLOTH 2 % EX PADS
6.0000 | MEDICATED_PAD | Freq: Every day | CUTANEOUS | Status: DC
  Administered 2022-04-11 – 2022-04-16 (×6): 6 via TOPICAL

## 2022-04-10 MED ORDER — DEXTROSE 10 % IV SOLN: INTRAVENOUS | Status: DC

## 2022-04-10 MED ORDER — CALCIUM GLUCONATE 10 % IV SOLN
3.0000 g | Freq: Four times a day (QID) | INTRAVENOUS | Status: AC
  Administered 2022-04-10 – 2022-04-11 (×2): 3 g via INTRAVENOUS
  Filled 2022-04-10 (×2): qty 30

## 2022-04-10 MED ORDER — SODIUM CHLORIDE 0.9 % IV SOLN: INTRAVENOUS | Status: DC | PRN

## 2022-04-10 NOTE — Procedures (Signed)
Patient Name: Ruth Gutierrez  MRN: [id number]  Epilepsy Attending: Lora Havens  Referring Physician/Provider: Lora Havens, MD Duration: 04/09/2022 0221 to 04/10/2022 0221   Patient history: 56 y.o. female with PMH significant forESRD on HD, headaches, history of meningioma with bifrontal severe vasogenic edema s/p resection in April 2022, HTN who became confused and lethargic during HD and was sent to the ED. EEG to evaluate for seizure   Level of alertness:  lethargic    AEDs during EEG study: LEV, PHT, LCM, propofol   Technical aspects: This EEG study was done with scalp electrodes positioned according to the 10-20 International system of electrode placement. Electrical activity was reviewed with band pass filter of 1-'70Hz'$ , sensitivity of 7 uV/mm, display speed of 92m/sec with a '60Hz'$  notched filter applied as appropriate. EEG data were recorded continuously and digitally stored.  Video monitoring was available and reviewed as appropriate.   Description: Spikes were noted in left> right frontal region admixed with rhythmic sharply contoured 3 to 5 Hz 11 delta slowing in left>right frontal region.    ABNORMALITY - Spikes, bifrontal region - Rhythmic delta activity, bifrontal region - Continuous slow, generalized    IMPRESSION: This study showed evidence of epileptogenicity and cortical  dysfunction arising from bifrontal region with increased risk of seizure recurrence.  Additionally there was severe diffuse encephalopathy, nonspecific etiology but likely related to seizure.  No definite seizure was seen during the study.    Maitland Lesiak OBarbra Sarks

## 2022-04-10 NOTE — Progress Notes (Signed)
NAME:  Ruth Gutierrez, MRN:  [id number], DOB:  1966/05/23, LOS: 5 ADMISSION DATE:  04/22/2022, CONSULTATION DATE:  04/07/2022 REFERRING MD:  Dr. Philipp Ovens, CHIEF COMPLAINT:  Acute Respiratory Failure, IHCA   History of Present Illness:  56 y/o female with history of ESRD s/p renal transplant on HD MWF, HTN, s/p parathyroidectomy, and depression. Admitted to ICU following cardiac arrest due to aspiration caused by seizure.  Now critically ill due to acute hypoxic respiratory failure requiring mechanical ventilation.  Pertinent  Medical History   Past Medical History:  Diagnosis Date   Anemia of chronic disease    Arthritis    Deceased-donor kidney transplant    Performed at [facility], April 2010.  Initial ESRD due to HTN nephropathy   Eczema    ESRD (end stage renal disease) ([place])    M/W/F dialysis   FUO (fever of unknown origin) 05/17/2015   GERD (gastroesophageal reflux disease)    Headache(784.0)    History of hyperparathyroidism    Hypertension    Peritonitis ([place]) 10/2019   Shortness of breath    Wears glasses      Significant Hospital Events: Including procedures, antibiotic start and stop dates in addition to other pertinent events   9/1 - admitted with AMS found to have seizures on EEG 9/3 - NG tube placed 9/4 - gradual increase in oxygen requirements, code blue at 1150, intubated and transferred to ICU. Went into afib with rvr during HD, started amio drip 9/5 - amiodarone stopped, metoprolol started, CT head unchanged no bleed, heparin gtt, remains on vent. After RR increased ABG showed hypoxia and alkalosis. Settings changed. Propofol weaned off, fentanyl added 9/6 - placed back on propofol for agitation and dyssynchrony on the vent overnight, showed cheyne stokes breathing pattern  9/7 - remains on vent and sedated, MRI for anoxic evaluation  Interim History / Subjective:  No significant changes from yesterday.   Objective   Blood pressure (!) 124/53, pulse  87, temperature 98.5 F (36.9 C), temperature source Rectal, resp. rate 18, height '5\' 4"'$  (1.626 m), weight 71 kg, last menstrual period 06/02/2015, SpO2 100 %.    Vent Mode: PRVC FiO2 (%):  [40 %-50 %] 40 % Set Rate:  [18 bmp-22 bmp] 18 bmp Vt Set:  [430 mL] 430 mL PEEP:  [8 cmH20] 8 cmH20 Pressure Support:  [8 cmH20] 8 cmH20 Plateau Pressure:  [21 cmH20] 21 cmH20   Intake/Output Summary (Last 24 hours) at 04/10/2022 8850 Last data filed at 04/10/2022 0700 Gross per 24 hour  Intake 2972.83 ml  Output 2900 ml  Net 72.83 ml   Filed Weights   04/09/22 0400 04/10/22 0500 04/10/22 0740  Weight: 71.8 kg 71 kg 71 kg    Examination: General: Ill appearing female laying in bed, intubated and sedated HENT: [place]/AT, EEG leads in place Lungs: Ventilated breath sounds bilaterally, CTA Cardiovascular: RRR Abdomen: soft, diminished BS Extremities: Minimal LE edema, palpable radial and pedal pulses bilaterally Neuro: Sedated RASS -5, sluggish but reactive pupils, no gag reflex, abnormal oculocephalic reflex, minimal response to painful stimuli  Resolved Hospital Problem list     Assessment & Plan:  Acute Respiratory Failure ARDS 2/2 aspiration IHCA  Pt had a seizure, agonal breathing, and lost a pulse on 9/4 with ROSC after 19 minutes of CPR. Intubated and sedated. CXR with diffuse infiltrates. TTM orders in if needed, patient has been normothermic after short period of fevers on 9/4. Showed Cheyne-Stokes breathing pattern yesterday causing desynchrony, did well  for a bit on pressure support, sedation increased and back on PRVC. ABG stable without acidosis.  - Continue vent management and attempt to wean sedation as possible - continue ceftriaxone until 9/9 - MRI brain for anoxic brain injury evaluation - We will continue to re-evaluate as we are able to wean sedation.    Status Epilepticus Suspected bifrontal region origin. On propofol, phenytoin, keppra, and vimpat. Neurology following. No  seizures on EEG for 48 hours plus.  - d/c EEG - medication per neurology - MRI today   Hypocalcemia Post Parathyroidectomy Calcium of 5.5, ionized 0.62 on admission. Responded well to aggressive repletion. Corrected calcium 8.7 today. - Continue with repletion  - Will need high Ca++ bath for outpatient dialysis   Acid-Base Imbalance Gap of 20 on transfer with bicarb of 19. Delta delta originally showed pure AGMA with lactate of 6.9. ABG showed pH of 7.248 with elevated pCO2 and bicarb of 21, RR increased and as sedation was weaned patient was breathing over the vent. Repeat ABG pH of 7.568, low pO2, normal pCO2, and elevated bicarb, RR decreased, PEEP and FiO2 increased. ABG pH 7.494, low pO2, normal pCO2, and elevated bicarb. Gap closed at 11 today. Pt on calcium carbonate. ABG pH 7.419, pCO2 44.2, pO2 76, bicarb 28.6 - CTM   ESRD on HD MWF Renal transplant in 2010, failed. Episode of Afib with RVR during HD on 9/4, prematurely ended HD. - Continue HD per nephrology   EKG Abnormalities ST elevations seen on telemetry consistently today. Unable to assess for symptoms, hemodynamically stable, troponin likely elevated post CPR. EKG showed possible PR depression and biphasic P waves in V1 and V2. Question of inverted T waves in II/III. Echo in 10/2020 showed moderate concentric LVH with EF of 60-65%, mild left atrial dilation, mild elevation in pulmonary artery pressure, moderate TR, and right atrial pressure of 3 mmHg.  - Echo showed EF 65%, mild concentric LVH, AV sclerosis without stenosis, and RAP 3 mmHg   Afib w/ RVR Episode during HD on 9/4. Amiodarone gtt started and then stopped on 9/6. Off cleviprex gtt. No afib in the past 24 hours on tele. - continue metoprolol  - heparin gtt   Hyperthyroidism TSH undetectable with elevated free T4 and normal T3 initially, now normal free T4 and low T3. This pattern likely represents subclinical hyperthyroidism with some transient effects of  amiodarone. As we could not do a RAIU scan due to recent amiodarone bolus and with no reported hyperthyroid symptoms prior to admission we elected not to treat. Trab and TSH ab negative.    Hypertension Previously on amlodipine 10 mg daily. Has been on cleviprex gtt intermittently. BB as above. We will continue to monitor. - PRN hydralazine   Hypoglycemia Pt was hypoglycemic overnight 9/6 and d10 gtt was added, off this today. Off tube feeds still, will continue to monitor.   Advanced Care Planning Pt is critically ill with unknown prognosis. We will consult palliative for goals of care discussions.   Best Practice (right click and "Reselect all SmartList Selections" daily)    Diet/type: tubefeeds on hold DVT prophylaxis: systemic heparin GI prophylaxis: PPI Lines: Central line Foley:  N/A Code Status:  full code Last date of multidisciplinary goals of care discussion [9/7]   Labs   CBC: Recent Labs  Lab 04/07/22 0318 04/07/22 1246 04/07/22 1308 04/08/22 0838 04/08/22 1458 04/08/22 1657 04/09/22 0407 04/09/22 0808 04/09/22 1240 04/10/22 0339  WBC 9.8 14.8*  --  10.9*  --   --  10.6*  --   --  11.5*  HGB 8.6* 9.2*   < > 8.1*   < > 8.2* 8.5* 9.5* 10.5* 9.0*  HCT 26.7* 30.2*   < > 25.5*   < > 24.0* 26.8* 28.0* 31.0* 28.9*  MCV 90.2 95.9  --  92.1  --   --  90.8  --   --  93.2  PLT 179 152  --  151  --   --  153  --   --  170   < > = values in this interval not displayed.    Basic Metabolic Panel: Recent Labs  Lab 04/06/22 0359 04/06/22 1751 04/07/22 0318 04/07/22 1150 04/07/22 1246 04/07/22 1308 04/07/22 2010 04/08/22 0302 04/08/22 1458 04/08/22 1657 04/08/22 1731 04/08/22 2015 04/09/22 0407 04/09/22 0808 04/09/22 1240 04/09/22 1645 04/09/22 2134 04/10/22 0339  NA 138  --  139  --  137   < >  --  134*   < > 134*  --   --  133* 130* 130*  --   --  132*  K 4.3  --  5.2*  --  5.9*   < >  --  4.8   < > 3.4*  --   --  4.0 4.0 4.2  --   --  3.9  CL 98  --   96*  --  98  --   --  93*  --   --   --   --  90*  --   --   --   --  91*  CO2 23  --  23  --  19*  --   --  25  --   --   --   --  28  --   --   --   --  30  GLUCOSE 74  --  89  --  83  --   --  154*  --   --   --   --  138*  --   --   --   --  105*  BUN 39*  --  47*  --  49*  --   --  36*  --   --   --   --  24*  --   --   --   --  18  CREATININE 10.69*  --  12.06*  --  13.34*  --   --  8.77*  --   --   --   --  4.97*  --   --   --   --  3.64*  CALCIUM 5.4*   < > 6.4*  --  8.8*  --   --  7.8*   < >  --   --  7.1* 6.9*  --   --  7.1* 7.7* 7.5*  7.5*  MG 2.4  --   --   --   --   --  2.1 2.0  --   --  1.8  --  2.2  --   --   --   --   --   PHOS  --   --   --    < >  --   --  3.7 3.9  --   --  2.6  --  3.4  3.5  --   --   --   --  3.7   < > = values in this interval not displayed.   GFR: Estimated Creatinine Clearance:  17.1 mL/min (A) (by C-G formula based on SCr of 3.64 mg/dL (H)). Recent Labs  Lab 04/07/22 1246 04/07/22 1527 04/07/22 2010 04/08/22 0838 04/09/22 0407 04/09/22 0916 04/09/22 1054 04/10/22 0339  WBC 14.8*  --   --  10.9* 10.6*  --   --  11.5*  LATICACIDVEN 6.9* 1.7 2.3*  --   --  0.7 1.2  --     Liver Function Tests: Recent Labs  Lab 04/13/2022 2019 04/05/22 0219 04/07/22 0318 04/07/22 1246 04/08/22 0302 04/09/22 0407 04/09/22 1054 04/10/22 0339  AST 19 16 13* 116*  --   --   --   --   ALT '19 17 13 '$ 90*  --   --   --   --   ALKPHOS 648* 613* 573* 650*  --   --   --   --   BILITOT 0.8 0.7 0.6 0.7  --   --   --   --   PROT 7.3 6.5 6.5 6.9  --   --   --   --   ALBUMIN 2.8* 2.7* 2.6* 2.6* 2.1* 2.5* 2.6* 2.4*   No results for input(s): "LIPASE", "AMYLASE" in the last 168 hours. No results for input(s): "AMMONIA" in the last 168 hours.  ABG    Component Value Date/Time   PHART 7.419 04/09/2022 1240   PCO2ART 44.2 04/09/2022 1240   PO2ART 76 (L) 04/09/2022 1240   HCO3 28.6 (H) 04/09/2022 1240   TCO2 30 04/09/2022 1240   ACIDBASEDEF 6.0 (H) 04/07/2022 1308    O2SAT 95 04/09/2022 1240     Coagulation Profile: Recent Labs  Lab 04/05/22 0219  INR 1.5*    Cardiac Enzymes: No results for input(s): "CKTOTAL", "CKMB", "CKMBINDEX", "TROPONINI" in the last 168 hours.  HbA1C: Hemoglobin A1C  Date/Time Value Ref Range Status  12/20/2020 10:01 AM 5.0 4.0 - 5.6 % Final    CBG: Recent Labs  Lab 04/09/22 1520 04/09/22 1954 04/09/22 2339 04/10/22 0338 04/10/22 0752  GLUCAP 124* 115* 110* 80 70    Review of Systems:   Sedated.  Past Medical History:  She,  has a past medical history of Anemia of chronic disease, Arthritis, Deceased-donor kidney transplant, Eczema, ESRD (end stage renal disease) ([place]), FUO (fever of unknown origin) (05/17/2015), GERD (gastroesophageal reflux disease), Headache(784.0), History of hyperparathyroidism, Hypertension, Peritonitis ([place]) (10/2019), Shortness of breath, and Wears glasses.   Surgical History:   Past Surgical History:  Procedure Laterality Date   A/V FISTULAGRAM Left 02/12/2017   Procedure: A/V Fistulagram;  Surgeon: Algernon Huxley, MD;  Location: [place] [facility];  Service: Cardiovascular;  Laterality: Left;   A/V FISTULAGRAM Left 12/30/2017   Procedure: A/V FISTULAGRAM;  Surgeon: Algernon Huxley, MD;  Location: [place] [facility];  Service: Cardiovascular;  Laterality: Left;   A/V FISTULAGRAM Left 04/01/2021   Procedure: A/V FISTULAGRAM;  Surgeon: Algernon Huxley, MD;  Location: [place] [facility];  Service: Cardiovascular;  Laterality: Left;   A/V SHUNT INTERVENTION N/A 02/12/2017   Procedure: A/V Shunt Intervention;  Surgeon: Algernon Huxley, MD;  Location: [place] [facility];  Service: Cardiovascular;  Laterality: N/A;   AV FISTULA PLACEMENT     BASCILIC VEIN TRANSPOSITION Left 10/30/2014   Procedure: LEFT [place];  Surgeon: Rosetta Posner, MD;  Location: [place];  Service: Vascular;  Laterality: Left;   [place] Left 01/03/2015   Procedure: LEFT ARM 2ND  STAGE [place];  Surgeon: Rosetta Posner,  MD;  Location: [place];  Service: Vascular;  Laterality: Left;   BIOPSY  10/23/2021   Procedure: BIOPSY;  Surgeon: Jackquline Denmark, MD;  Location: [facility]OSCOPY;  Service: Gastroenterology;;   BREAST BIOPSY Left    Patient doesn't remember any information from previous procedure.   CAPD REMOVAL N/A 10/31/2019   Procedure: PERITONEAL DIALYSIS  (CAPD) INFECTED CATHETER REMOVAL;  Surgeon: Coralie Keens, MD;  Location: [place];  Service: General;  Laterality: N/A;   CRANIOTOMY N/A 11/01/2020   Procedure: CRANIOTOMY FOR TUMOR EXCISION;  Surgeon: [name], MD;  Location: [place];  Service: Neurosurgery;  Laterality: N/A;   ESOPHAGOGASTRODUODENOSCOPY (EGD) WITH PROPOFOL N/A 10/23/2021   Procedure: ESOPHAGOGASTRODUODENOSCOPY (EGD) WITH PROPOFOL;  Surgeon: Jackquline Denmark, MD;  Location: [place];  Service: Gastroenterology;  Laterality: N/A;   FRACTURE SURGERY     left foot,baby toe nad next toe missing   INSERTION OF DIALYSIS CATHETER Right 10/30/2014   Procedure: INSERTION OF DIALYSIS CATHETER;  Surgeon: Rosetta Posner, MD;  Location: [place];  Service: Vascular;  Laterality: Right;   KIDNEY TRANSPLANT  11/02/2008   [facility])   PARATHYROIDECTOMY N/A 03/18/2022   Procedure: TOTAL PARATHYROIDECTOMY;  Surgeon: Armandina Gemma, MD;  Location: [place];  Service: General;  Laterality: N/A;   PLACEMENT OF LUMBAR DRAIN N/A 11/01/2020   Procedure: PLACEMENT OF LUMBAR DRAIN;  Surgeon: [name], MD;  Location: [place];  Service: Neurosurgery;  Laterality: N/A;   WISDOM TOOTH EXTRACTION       Social History:   reports that she has been smoking cigarettes. She has a 13.50 pack-year smoking history. She has never used smokeless tobacco. She reports that she does not drink alcohol and does not use drugs.   Family History:  Her family history includes Deep vein thrombosis in her brother; Diabetes in her brother; Hyperlipidemia in her  sister; Hypertension in her father, mother, sister, and sister; Kidney disease in her brother. There is no history of Colon cancer, Esophageal cancer, Rectal cancer, or Breast cancer.   Allergies Allergies  Allergen Reactions   Penicillins Itching and Rash    Did it involve swelling of the face/tongue/throat, SOB, or low BP?Y Did it involve sudden or severe rash/hives, skin peeling, or any reaction on the inside of your mouth or nose? Y Did you need to seek medical attention at a hospital or doctor's office? Y When did it last happen?  2016     If all above answers are "NO", may proceed with cephalosporin use. 04/07/22> previously tolerated ceftriaxone, cefepime     Home Medications  Prior to Admission medications   Medication Sig Start Date End Date Taking? Authorizing Provider  acetaminophen (TYLENOL) 500 MG tablet Take 500 mg by mouth every 6 (six) hours as needed.   Yes [provider]  albuterol (VENTOLIN HFA) 108 (90 Base) MCG/ACT inhaler Inhale 3 puffs into the lungs daily as needed for wheezing or shortness of breath. 11/12/21 04/05/22 Yes Gaylan Gerold, DO  amLODipine (NORVASC) 10 MG tablet Take 1 tablet (10 mg total) by mouth daily. 07/02/21  Yes Lajean Manes, MD  cloNIDine (CATAPRES) 0.1 MG tablet Take 0.1 mg by mouth 2 (two) times daily. 04/01/22  Yes [provider]  diclofenac Sodium (VOLTAREN) 1 % GEL Apply 2 g topically 2 (two) times daily as needed (Right shoulder pain). Patient taking differently: Apply 1 Application topically daily as needed (Right shoulder pain). 01/20/22  Yes Gaylan Gerold, DO  diphenhydrAMINE (BENADRYL) 25 mg capsule Take  50 mg by mouth 3 (three) times daily as needed for allergies.   Yes [provider]  lidocaine-prilocaine (EMLA) cream Apply 1 application. topically every Monday, Wednesday, and Friday with hemodialysis. 02/01/20  Yes [provider]  sevelamer carbonate (RENVELA) 800 MG tablet Take 1,600 mg by mouth 3  (three) times daily with meals.   Yes [provider]  calcitRIOL (ROCALTROL) 0.5 MCG capsule Take 4 capsules (2 mcg total) by mouth 2 (two) times daily between meals. Patient not taking: Reported on 04/05/2022 03/31/22   Sanjuan Dame, MD  Calcium Carbonate Antacid (CALCIUM CARBONATE, DOSED IN MG ELEMENTAL CALCIUM,) 1250 MG/5ML SUSP Take 24 mLs (2,400 mg of elemental calcium total) by mouth 4 (four) times daily. Patient not taking: Reported on 04/05/2022 03/31/22   Sanjuan Dame, MD  pantoprazole (PROTONIX) 40 MG tablet Take 1 tablet (40 mg total) by mouth 2 (two) times daily before a meal. Patient not taking: Reported on 03/19/2022 10/24/21 02/13/22  Rosezetta Schlatter, MD     Critical care time: 65

## 2022-04-10 NOTE — Progress Notes (Signed)
Pt stable for HD VVS LAVF +/+ UFG 3L

## 2022-04-10 NOTE — Progress Notes (Signed)
TX INITIATED W/O DIFFICULTY AVF CLEANED PER PROTOCOL CANNULATED 15G X 2 SUCCESSFUL BFR ACHIEVED UFG 3L LINES SECURED

## 2022-04-10 NOTE — Progress Notes (Addendum)
[facility] Progress Note  Subjective: had HD last night and is on this am. Got adenosine, NG cardizem yesterday. UF 2.1 L yest and 3 L this am. BP's dropped late in HD this am.   Vitals:   04/10/22 0752 04/10/22 0800 04/10/22 0815 04/10/22 0830  BP: (!) 125/55 (!) 174/74 (!) 151/65 (!) 152/67  Pulse: 90 95 84 84  Resp: '18 15 18 16  '$ Temp: 98.5 F (36.9 C)     TempSrc: Rectal     SpO2: 100% 100% 100% 100%  Weight:      Height:        Exam: Gen on vent, sedated No rash, cyanosis or gangrene Sclera anicteric, throat w/ ETT No jvd or bruits Chest clear anterior/ lateral RRR no MRG Abd soft ntnd no mass or ascites +bs GU defer MS no joint effusions or deformity Ext no sig edema Neuro is on vent, sedated   LUE AVF +bruit  OP HD: [place] M[facility]  4h  65kg  2/3 bath  LUE AVF  Hep none - hectorol 4 u g tiw - venofer '100mg'$   - mircera 225 q 2, last 8/30    Summary: 56 y.o. female ESRD, h/o transplant, PTX, HTN, depression p/w AMS.  Rx only 1hr33mn of HD on 9/1 bec of lethargy and confusion  noted to be somnolent and initially thought that Ativan was contributing. Pt has been confused for 1-2 days; recently had a parathyroidecomy on 8/15 and was treated for hypocalcemia subsequently. Her regimen was supposed to be  liquid CaCO3 '2400mg'$  suspension 4x daily but according to the mother she did not tolerate this in the hospital last admission and it was confirmed with mother and daughter (lives w/ daughter) she was not taking any Ca at home.  Ca noted to be 5.5 in the ED with a pCO2 of 18. Patient had facial twitching in the ED with subsequent MRI of the brain showed gyriform diffusion restriction of the anterior right frontal lob suggestive of acute seizure/status epilepticus. EEG confirmed that it was coming from the right frontal lobe. Pt admitted, we were asked to see for ESRD.     CXR 9/6- gross cardiomegaly, extensive bilateral IS and consolidative airspace opacity with  layering pleural effusions   Assessment/ Plan: SP cardiac arrest - on 9/4 VDRF - w/ bilat infiltrates by CXR and IS edema, persistent 9/6 Afib/ RVR - per CCM Volume - was up 10kg at peak, w/ serial HD should now be about 3 kg up.  ESRD - cont HD MWF. HD today. Next HD Friday/Sat.  Hypocalcemia/ hungry bone syndrome sp recent total parathyroidectomy 8/15. Pt dc'd on CaCO3 2400 elemental qid, but per the family she was not taking any calcium at home. Pt here now w/ seizures and hypocalcemia. We restarted her po CaCO3 here but she was not taking po reliably so NG tube was placed. Got tid IV Ca gluc boluses 4 gm x 3 on 9/4, no IV yesterday.  - no high Ca++ bath available here, but will need at dc  - Ca 7.5 today (CCa approx 8.5) - holding po CaCO3 and calcitriol until gut is working - getting IV vdra (high-dose hectorol) 12 ug tiw IV - Ca stable > 7, will give IV Ca gluc 3 gm x 2 IV today - cont 2- 3 serum Ca++ levels / d  HTN - getting metoprolol 25 bid, cleviprex gtt prn Seizures - f/b neurology, getting Keppra, Vimpat, dilantin, Onfi Anemia esrd -Hgb 8's,  last op mircera 244mg on 8/30, next esa due 9/13.  Secondary hyperparathyroidism - see as above hypocalcemia post PTX. Binders are on hold post PTX and phos is stable.  History of meningioma - status post resection 11/01/2020  RKelly Splinter9/02/2022, 8:42 AM   Recent Labs  Lab 04/09/22 0407 04/09/22 0808 04/09/22 1054 04/09/22 1240 04/09/22 1645 04/09/22 2134 04/10/22 0339  HGB 8.5*   < >  --  10.5*  --   --  9.0*  ALBUMIN 2.5*  --  2.6*  --   --   --  2.4*  CALCIUM 6.9*  --   --   --    < > 7.7* 7.5*  7.5*  PHOS 3.4  3.5  --   --   --   --   --  3.7  CREATININE 4.97*  --   --   --   --   --  3.64*  K 4.0   < >  --  4.2  --   --  3.9   < > = values in this interval not displayed.    No results for input(s): "IRON", "TIBC", "FERRITIN" in the last 168 hours. Inpatient medications:  acetaminophen  650 mg Per Tube Q4H   Or    acetaminophen (TYLENOL) oral liquid 160 mg/5 mL  650 mg Per Tube Q4H   Or   acetaminophen  650 mg Rectal Q4H   bisacodyl  10 mg Rectal Q0600   Chlorhexidine Gluconate Cloth  6 each Topical Q0600   cloBAZam  5 mg Per Tube Daily   diltiazem  60 mg Per Tube Q6H   docusate  100 mg Per Tube BID   doxercalciferol  12 mcg Intravenous Q M,W,F-HD   feeding supplement (PROSource TF20)  60 mL Per Tube Daily   levETIRAcetam  1,000 mg Per Tube Daily   levETIRAcetam  500 mg Per Tube Once per day on Mon Wed Fri   metoprolol tartrate  25 mg Per Tube BID   mouth rinse  15 mL Mouth Rinse Q2H   pantoprazole (PROTONIX) IV  40 mg Intravenous Q24H   polyethylene glycol  17 g Per Tube Daily   sodium chloride flush  10-40 mL Intracatheter Q12H    cefTRIAXone (ROCEPHIN)  IV Stopped (04/09/22 1347)   dextrose 50 mL/hr at 04/10/22 0700   feeding supplement (VITAL 1.5 CAL) Stopped (04/09/22 1030)   fentaNYL infusion INTRAVENOUS 200 mcg/hr (04/10/22 0842)   heparin 1,650 Units/hr (04/10/22 0700)   lacosamide (VIMPAT) IV Stopped (04/09/22 2235)   levETIRAcetam     levETIRAcetam Stopped (04/09/22 2153)   norepinephrine (LEVOPHED) Adult infusion Stopped (04/08/22 1530)   phenytoin (DILANTIN) IV Stopped (04/10/22 0029)   propofol (DIPRIVAN) infusion Stopped (04/09/22 1100)   albuterol, diclofenac Sodium, fentaNYL, hydrALAZINE, LORazepam, metoprolol tartrate, mouth rinse, sodium chloride flush

## 2022-04-10 NOTE — Procedures (Addendum)
Patient Name: Ruth Gutierrez  MRN: [id number]  Epilepsy Attending: Lora Havens  Referring Physician/Provider: Lora Havens, MD Duration: 04/10/2022 0221 to 04/10/2022 1113   Patient history: 56 y.o. female with PMH significant forESRD on HD, headaches, history of meningioma with bifrontal severe vasogenic edema s/p resection in April 2022, HTN who became confused and lethargic during HD and was sent to the ED. EEG to evaluate for seizure   Level of alertness:  lethargic    AEDs during EEG study: LEV, PHT, LCM, propofol   Technical aspects: This EEG study was done with scalp electrodes positioned according to the 10-20 International system of electrode placement. Electrical activity was reviewed with band pass filter of 1-'70Hz'$ , sensitivity of 7 uV/mm, display speed of 107m/sec with a '60Hz'$  notched filter applied as appropriate. EEG data were recorded continuously and digitally stored.  Video monitoring was available and reviewed as appropriate.   Description: Spikes were noted in left> right frontal region admixed with rhythmic sharply contoured 3 to 5 Hz 11 delta slowing in left>right frontal region.    ABNORMALITY - Spikes, bifrontal region - Rhythmic delta activity, bifrontal region - Continuous slow, generalized    IMPRESSION: This study showed evidence of epileptogenicity and cortical  dysfunction arising from bifrontal region with increased risk of seizure recurrence.  Additionally there was severe diffuse encephalopathy, nonspecific etiology but likely related to seizure.  No definite seizure was seen during the study.    Yerick Eggebrecht OBarbra Sarks

## 2022-04-10 NOTE — Progress Notes (Signed)
ANTICOAGULATION CONSULT NOTE  Pharmacy Consult for Heparin Indication: atrial fibrillation  Allergies  Allergen Reactions   Penicillins Itching and Rash    Did it involve swelling of the face/tongue/throat, SOB, or low BP?Y Did it involve sudden or severe rash/hives, skin peeling, or any reaction on the inside of your mouth or nose? Y Did you need to seek medical attention at a hospital or doctor's office? Y When did it last happen?  2016     If all above answers are "NO", may proceed with cephalosporin use. 04/07/22> previously tolerated ceftriaxone, cefepime    Patient Measurements: Height: '5\' 4"'$  (162.6 cm) Weight: 71.8 kg (158 lb 4.6 oz) IBW/kg (Calculated) : 54.7 Heparin Dosing Weight: 70.5 kg  Vital Signs: Temp: 98.1 F (36.7 C) (09/07 0500) Temp Source: Rectal (09/07 0500) BP: 107/54 (09/07 0515) Pulse Rate: 86 (09/07 0515)  Labs: Recent Labs    04/08/22 0302 04/08/22 9735 04/08/22 1458 04/09/22 0407 04/09/22 0808 04/09/22 1240 04/09/22 1314 04/09/22 2340 04/10/22 0339  HGB  --  8.1*   < > 8.5* 9.5* 10.5*  --   --  9.0*  HCT  --  25.5*   < > 26.8* 28.0* 31.0*  --   --  28.9*  PLT  --  151  --  153  --   --   --   --  170  HEPARINUNFRC  --   --    < > <0.10*  --   --  0.20* 0.36 0.40  CREATININE 8.77*  --   --  4.97*  --   --   --   --  3.64*   < > = values in this interval not displayed.     Estimated Creatinine Clearance: 17.2 mL/min (A) (by C-G formula based on SCr of 3.64 mg/dL (H)).   Medical History: Past Medical History:  Diagnosis Date   Anemia of chronic disease    Arthritis    Deceased-donor kidney transplant    Performed at [facility], April 2010.  Initial ESRD due to HTN nephropathy   Eczema    ESRD (end stage renal disease) ([place])    M/W/F dialysis   FUO (fever of unknown origin) 05/17/2015   GERD (gastroesophageal reflux disease)    Headache(784.0)    History of hyperparathyroidism    Hypertension    Peritonitis ([place]) 10/2019    Shortness of breath    Wears glasses     Assessment: 56 yo female with history of ESRD - HD MWF, HTN, s/p parathyroidectomy, and depression. Renal transplant in 2010 failed. Recent surgery, parathyroidectomy, on 03/18/2022. Episode of afib with RVR during HD on 9/4. No anticoagulation PTA. Pharmacy consulted for IV heparin. No evidence of bleeding on CT 9/5.   CBC stable, Hgb 9, PLT 170.   Per RN, no issues with infusion/line/no heparin holds and no s/sx of bleeding. Heparin level 0.4 (therapeutic)  Goal of Therapy:  Heparin level 0.3-0.7 units/ml Monitor platelets by anticoagulation protocol: Yes   Plan:  Continue heparin infusion 1650 units/hr Check daily heparin level Monitor daily CBC and s/sx of bleeding   Francena Hanly, PharmD Pharmacy Resident  04/10/2022 5:44 AM

## 2022-04-10 NOTE — Progress Notes (Addendum)
Subjective: No acute events overnight.  Daughter at bedside.  Discussed plan for MRI brain today. Febrile overnight  ROS: Unable to obtain due to poor mental status  Examination  Vital signs in last 24 hours: Temp:  [97.6 F (36.4 C)-101.2 F (38.4 C)] 98.5 F (36.9 C) (09/07 0752) Pulse Rate:  [63-214] 88 (09/07 1030) Resp:  [9-27] 16 (09/07 1030) BP: (86-205)/(42-162) 120/56 (09/07 1030) SpO2:  [64 %-100 %] 100 % (09/07 1030) FiO2 (%):  [40 %-50 %] 40 % (09/07 0752) Weight:  [71 kg] 71 kg (09/07 0740)  General: lying in bed, NAD RS: intubated Neuro: briefly opens eyes but doesn't track examiner, doesn't follow commands, PERLA, corneal reflex intact, cough reflex intact, didn't withdraw to noxious stimuli in all extremities  Basic Metabolic Panel: Recent Labs  Lab 04/06/22 0359 04/06/22 1751 04/07/22 0318 04/07/22 1150 04/07/22 1246 04/07/22 1308 04/07/22 2010 04/08/22 0302 04/08/22 1458 04/08/22 1657 04/08/22 1731 04/08/22 2015 04/09/22 0407 04/09/22 0808 04/09/22 1240 04/09/22 1645 04/09/22 2134 04/10/22 0339  NA 138  --  139  --  137   < >  --  134*   < > 134*  --   --  133* 130* 130*  --   --  132*  K 4.3  --  5.2*  --  5.9*   < >  --  4.8   < > 3.4*  --   --  4.0 4.0 4.2  --   --  3.9  CL 98  --  96*  --  98  --   --  93*  --   --   --   --  90*  --   --   --   --  91*  CO2 23  --  23  --  19*  --   --  25  --   --   --   --  28  --   --   --   --  30  GLUCOSE 74  --  89  --  83  --   --  154*  --   --   --   --  138*  --   --   --   --  105*  BUN 39*  --  47*  --  49*  --   --  36*  --   --   --   --  24*  --   --   --   --  18  CREATININE 10.69*  --  12.06*  --  13.34*  --   --  8.77*  --   --   --   --  4.97*  --   --   --   --  3.64*  CALCIUM 5.4*   < > 6.4*  --  8.8*  --   --  7.8*   < >  --   --    < > 6.9*  --   --  7.1* 7.7* 7.5*  7.5*  MG 2.4  --   --   --   --   --  2.1 2.0  --   --  1.8  --  2.2  --   --   --   --   --   PHOS  --   --   --    < >   --   --  3.7 3.9  --   --  2.6  --  3.4  3.5  --   --   --   --  3.7   < > = values in this interval not displayed.    CBC: Recent Labs  Lab 04/07/22 0318 04/07/22 1246 04/07/22 1308 04/08/22 0838 04/08/22 1458 04/08/22 1657 04/09/22 0407 04/09/22 0808 04/09/22 1240 04/10/22 0339  WBC 9.8 14.8*  --  10.9*  --   --  10.6*  --   --  11.5*  HGB 8.6* 9.2*   < > 8.1*   < > 8.2* 8.5* 9.5* 10.5* 9.0*  HCT 26.7* 30.2*   < > 25.5*   < > 24.0* 26.8* 28.0* 31.0* 28.9*  MCV 90.2 95.9  --  92.1  --   --  90.8  --   --  93.2  PLT 179 152  --  151  --   --  153  --   --  170   < > = values in this interval not displayed.   Coagulation Studies: No results for input(s): "LABPROT", "INR" in the last 72 hours.  Imaging No new brain imaging     ASSESSMENT AND PLAN:  71 old female with status epilepticus arising from right frontal region in the setting of severe hypercalcemia and prior meningioma resection.   Refractory electrographic/nonconvulsive status epilepticus, resolved Acute encephalopathy Fever - No definite seizures overnight  Recommendations -Continue current dose of Keppra, Vimpat, Onfi '5mg'$  daily and Phenytoin '125mg'$  Q8h  -Discontinue LTM EEG to obtain MRI brain without contrast -MRI brain without contrast to look for anoxic/hypoxic brain injury - Palliative care consult as patient has significant comorbidities and now s/p cardiac arrest -Management of rest of comorbidities per primary team -Discussed plan with critical care team and family at bedside   I have spent a total of  36  minutes with the patient reviewing hospital notes,  test results, labs and examining the patient as well as establishing an assessment and plan.  > 50% of time was spent in direct patient care.   Zeb Comfort Epilepsy [facility] For questions after 5pm please refer to AMION to reach the Neurologist on call

## 2022-04-10 NOTE — Progress Notes (Signed)
ANTICOAGULATION CONSULT NOTE - Follow Up Consult  Pharmacy Consult for heparin Indication: atrial fibrillation  Labs: Recent Labs    04/07/22 1246 04/07/22 1308 04/08/22 0302 04/08/22 0838 04/08/22 1458 04/09/22 0407 04/09/22 0808 04/09/22 1240 04/09/22 1314 04/09/22 2340  HGB 9.2*   < >  --  8.1*   < > 8.5* 9.5* 10.5*  --   --   HCT 30.2*   < >  --  25.5*   < > 26.8* 28.0* 31.0*  --   --   PLT 152  --   --  151  --  153  --   --   --   --   HEPARINUNFRC  --   --   --   --    < > <0.10*  --   --  0.20* 0.36  CREATININE 13.34*  --  8.77*  --   --  4.97*  --   --   --   --    < > = values in this interval not displayed.    Assessment/Plan:  56yo female therapeutic on heparin after rate change. Will continue infusion at current rate of 1650 units/hr and confirm stable with am labs.   Wynona Neat, PharmD, BCPS  04/10/2022,12:43 AM

## 2022-04-10 NOTE — Telephone Encounter (Signed)
Pt was sent a my Chart message in regard to scheduling pt for an EGD and Colonoscopy:

## 2022-04-10 NOTE — Progress Notes (Signed)
LTM EEG discontinued - no skin breakdown at [facility]. Some glue remaining in Pt hair. Gave Family member instructions on further cleaning. Family member was in room on Phone.

## 2022-04-10 NOTE — Progress Notes (Signed)
Received patient in bed to unit.  Alert and oriented.  Informed consent signed and in chart.   Treatment initiated: 0745 Treatment completed: 1110  Patient tolerated well.  Transported back to the room  Alert, without acute distress.  Hand-off given to patient's nurse.   Access used: LAVF Access issues: NONE  Total UF removed: 3L Medication(s) given: NONE Post HD VS: 144/61 85 100% 98.4 18 Post HD weight: UTA   Na'Shaminy T Freada [facility]

## 2022-04-10 NOTE — Progress Notes (Signed)
eLink Physician-Brief Progress Note Patient Name: RIYANSHI WAHAB DOB: [birth year] MRN: [id number]   Date of Service  04/10/2022  HPI/Events of Note  Hypoglycemia - Blood glucose = 76.  eICU Interventions  Plan: D10W IV infusion at 40 mL/hour.      Intervention Category Major Interventions: Other:  Lysle Dingwall 04/10/2022, 10:06 PM

## 2022-04-10 NOTE — Progress Notes (Signed)
Patient continuously fighting against the ventilator despite PRN fentanyl boluses and increasing the fentanyl drip rate. Elink notified, MD ordered PRN Ativan. Patient's QTC 526, Elink MD informed. Told to still give the PRN Ativan per [facility] MD.

## 2022-04-10 NOTE — Progress Notes (Signed)
Pt was transported to MRI via ventilator with no apparent complications. Pt is currently stable at this time.Pt is now back in 2M10.

## 2022-04-11 ENCOUNTER — Other Ambulatory Visit: Payer: Self-pay

## 2022-04-11 DIAGNOSIS — G931 Anoxic brain damage, not elsewhere classified: Secondary | ICD-10-CM | POA: Diagnosis not present

## 2022-04-11 DIAGNOSIS — G9341 Metabolic encephalopathy: Secondary | ICD-10-CM | POA: Diagnosis not present

## 2022-04-11 DIAGNOSIS — R4182 Altered mental status, unspecified: Secondary | ICD-10-CM | POA: Diagnosis not present

## 2022-04-11 LAB — GLUCOSE, CAPILLARY
Glucose-Capillary: 109 mg/dL — ABNORMAL HIGH (ref 70–99)
Glucose-Capillary: 112 mg/dL — ABNORMAL HIGH (ref 70–99)
Glucose-Capillary: 117 mg/dL — ABNORMAL HIGH (ref 70–99)
Glucose-Capillary: 131 mg/dL — ABNORMAL HIGH (ref 70–99)
Glucose-Capillary: 133 mg/dL — ABNORMAL HIGH (ref 70–99)
Glucose-Capillary: 99 mg/dL (ref 70–99)

## 2022-04-11 LAB — CALCIUM
Calcium: 6.9 mg/dL — ABNORMAL LOW (ref 8.9–10.3)
Calcium: 7.6 mg/dL — ABNORMAL LOW (ref 8.9–10.3)

## 2022-04-11 LAB — CBC
HCT: 28.3 % — ABNORMAL LOW (ref 36.0–46.0)
Hemoglobin: 8.6 g/dL — ABNORMAL LOW (ref 12.0–15.0)
MCH: 29 pg (ref 26.0–34.0)
MCHC: 30.4 g/dL (ref 30.0–36.0)
MCV: 95.3 fL (ref 80.0–100.0)
Platelets: 159 10*3/uL (ref 150–400)
RBC: 2.97 MIL/uL — ABNORMAL LOW (ref 3.87–5.11)
RDW: 19.2 % — ABNORMAL HIGH (ref 11.5–15.5)
WBC: 11.4 10*3/uL — ABNORMAL HIGH (ref 4.0–10.5)
nRBC: 0 % (ref 0.0–0.2)

## 2022-04-11 LAB — RENAL FUNCTION PANEL
Albumin: 2.1 g/dL — ABNORMAL LOW (ref 3.5–5.0)
Anion gap: 15 (ref 5–15)
BUN: 23 mg/dL — ABNORMAL HIGH (ref 6–20)
CO2: 26 mmol/L (ref 22–32)
Calcium: 8.1 mg/dL — ABNORMAL LOW (ref 8.9–10.3)
Chloride: 90 mmol/L — ABNORMAL LOW (ref 98–111)
Creatinine, Ser: 3.53 mg/dL — ABNORMAL HIGH (ref 0.44–1.00)
GFR, Estimated: 15 mL/min — ABNORMAL LOW (ref >60)
Glucose, Bld: 113 mg/dL — ABNORMAL HIGH (ref 70–99)
Phosphorus: 3.4 mg/dL (ref 2.5–4.6)
Potassium: 3.8 mmol/L (ref 3.5–5.1)
Sodium: 131 mmol/L — ABNORMAL LOW (ref 135–145)

## 2022-04-11 LAB — TRIGLYCERIDES: Triglycerides: 91 mg/dL (ref <150)

## 2022-04-11 LAB — HEPARIN LEVEL (UNFRACTIONATED): Heparin Unfractionated: 0.3 IU/mL (ref 0.30–0.70)

## 2022-04-11 MED ORDER — CALCIUM GLUCONATE-NACL 2-0.675 GM/100ML-% IV SOLN
2.0000 g | Freq: Once | INTRAVENOUS | Status: AC
  Administered 2022-04-11: 2000 mg via INTRAVENOUS
  Filled 2022-04-11: qty 100

## 2022-04-11 MED ORDER — CALCIUM CARBONATE ANTACID 1250 MG/5ML PO SUSP
3000.0000 mg | Freq: Three times a day (TID) | ORAL | Status: DC
  Administered 2022-04-11 – 2022-04-16 (×16): 3000 mg
  Filled 2022-04-11 (×15): qty 30

## 2022-04-11 MED ORDER — AMANTADINE HCL 50 MG/5ML PO SOLN
100.0000 mg | Freq: Two times a day (BID) | ORAL | Status: DC
Start: 2022-04-11 — End: 2022-04-14
  Administered 2022-04-11 – 2022-04-14 (×6): 100 mg
  Filled 2022-04-11 (×7): qty 10

## 2022-04-11 MED ORDER — PANTOPRAZOLE 2 MG/ML SUSPENSION
40.0000 mg | Freq: Every day | ORAL | Status: DC
  Administered 2022-04-12 – 2022-04-16 (×5): 40 mg
  Filled 2022-04-11 (×5): qty 20

## 2022-04-11 NOTE — Progress Notes (Signed)
RT placed patient on cpap/psv per MD request. Patient did not tolerate well. Patient spontaneous RR was no higher than 6bpm throughout the process. Patient eventually had no respiratory effort and RT placed patient on full support settings. RN and MD made aware. Family at bedside. RT will continue to monitor.

## 2022-04-11 NOTE — Progress Notes (Signed)
[facility] Progress Note  Subjective: getting cardizem 60 mg ng qid and metoprolol 25 bid per ng, and tube feeds restarted yest. Ca up to 8.1 today. Wts 69.9 yest. I/O - 477 yest.   Vitals:   04/11/22 0700 04/11/22 0756 04/11/22 0800 04/11/22 0821  BP: (!) 157/70  (!) 142/70 (!) 152/71  Pulse: 94  97 97  Resp: 17  18 (!) 23  Temp:  99.4 F (37.4 C)    TempSrc:  Esophageal    SpO2: 99%  100%   Weight:      Height:        Exam: Gen on vent, sedated No rash, cyanosis or gangrene Sclera anicteric, throat w/ ETT No jvd or bruits Chest clear anterior/ lateral RRR no MRG Abd soft ntnd no mass or ascites +bs GU defer MS no joint effusions or deformity Ext no sig edema Neuro is on vent, sedated   LUE AVF +bruit  OP HD: [place] M[facility]  4h  65kg  2/3 bath  LUE AVF  Hep none - hectorol 4 u g tiw - venofer '100mg'$   - mircera 225 q 2, last 8/30    Summary: 56 y.o. female ESRD, h/o transplant, PTX, HTN, depression p/w AMS.  Rx only 1hr61mn of HD on 9/1 bec of lethargy and confusion  noted to be somnolent and initially thought that Ativan was contributing. Pt has been confused for 1-2 days; recently had a parathyroidecomy on 8/15 and was treated for hypocalcemia subsequently. Her regimen was supposed to be  liquid CaCO3 '2400mg'$  suspension 4x daily but according to the mother she did not tolerate this in the hospital last admission and it was confirmed with mother and daughter (lives w/ daughter) she was not taking any Ca at home.  Ca noted to be 5.5 in the ED with a pCO2 of 18. Patient had facial twitching in the ED with subsequent MRI of the brain showed gyriform diffusion restriction of the anterior right frontal lob suggestive of acute seizure/status epilepticus. EEG confirmed that it was coming from the right frontal lobe. Pt admitted, we were asked to see for ESRD.     CXR 9/6- gross cardiomegaly, extensive bilateral IS and consolidative airspace opacity with layering pleural  effusions   Assessment/ Plan: SP cardiac arrest - on 9/4 AMS - MRI 9/07 showed signs of significant ABI VDRF - w/ bilat infiltrates by CXR and IS edema, persistent 9/6 Afib/ RVR - getting per tube cardizem/ metoprolol now, HR's 80-90s, NSR.  Volume - was up 10kg at peak, w/ serial HD still up 4kg due to high vol load from IV seizure and sedating meds.  ESRD - cont HD MWF. Had HD last 3 days in a room, plan next HD tonight if staffing allows vs possibly tomorrow am  Hypocalcemia/ hungry bone syndrome sp recent total parathyroidectomy 8/15. Pt dc'd on CaCO3 2400 elemental qid, but per the family she was not taking any calcium at home. Pt here now w/ seizures and hypocalcemia. We restarted her po CaCO3 here but she was not taking po reliably so NG tube was placed. Got tid IV Ca gluc boluses 4 gm x 3 on 9/4, no IV yesterday.  - no high Ca++ bath available here, but will need at dc  - Ca 8.1 today (corrCa approx 9.1) - will resume po CaCO3 at '3000mg'$  elemental tid between meals - hold off on IV Ca gluc today - cont IV vdra (high-dose hectorol) 12 ug tiw IV - bid  serum Ca++ levels today  HTN - sp Cleviprex gtt, getting cardizem and metoprolol po as above Seizures - f/b neurology, getting Keppra, Vimpat, dilantin, Onfi Anemia esrd -Hgb 8's, last op mircera 232mg on 8/30, next esa due 9/13.  Secondary hyperparathyroidism - see as above hypocalcemia post PTX. Binders are on hold post PTX and phos is stable.  History of meningioma - status post resection 11/01/2020  RKelly Splinter9/03/2022, 9:05 AM   Recent Labs  Lab 04/10/22 0339 04/10/22 1406 04/10/22 2358 04/11/22 0406  HGB 9.0*  --   --  8.6*  ALBUMIN 2.4*  --   --  2.1*  CALCIUM 7.5*  7.5*   < > 7.6* 8.1*  PHOS 3.7  --   --  3.4  CREATININE 3.64*  --   --  3.53*  K 3.9  --   --  3.8   < > = values in this interval not displayed.    No results for input(s): "IRON", "TIBC", "FERRITIN" in the last 168 hours. Inpatient medications:   acetaminophen  650 mg Per Tube Q4H   Or   acetaminophen (TYLENOL) oral liquid 160 mg/5 mL  650 mg Per Tube Q4H   Or   acetaminophen  650 mg Rectal Q4H   bisacodyl  10 mg Rectal Q0600   Chlorhexidine Gluconate Cloth  6 each Topical Q0600   cloBAZam  5 mg Per Tube Daily   diltiazem  60 mg Per Tube Q6H   docusate  100 mg Per Tube BID   doxercalciferol  12 mcg Intravenous Q M,W,F-HD   feeding supplement (PROSource TF20)  60 mL Per Tube Daily   levETIRAcetam  1,000 mg Per Tube Daily   levETIRAcetam  500 mg Per Tube Once per day on Mon Wed Fri   metoprolol tartrate  25 mg Per Tube BID   mouth rinse  15 mL Mouth Rinse Q2H   pantoprazole (PROTONIX) IV  40 mg Intravenous Q24H   polyethylene glycol  17 g Per Tube Daily   sodium chloride flush  10-40 mL Intracatheter Q12H    sodium chloride 10 mL/hr at 04/11/22 0010   cefTRIAXone (ROCEPHIN)  IV Stopped (04/10/22 1433)   dextrose 40 mL/hr at 04/11/22 0800   feeding supplement (VITAL 1.5 CAL) Stopped (04/09/22 1030)   fentaNYL infusion INTRAVENOUS 200 mcg/hr (04/11/22 0902)   heparin 1,650 Units/hr (04/11/22 0800)   lacosamide (VIMPAT) IV Stopped (04/10/22 2228)   levETIRAcetam Stopped (04/10/22 1348)   levETIRAcetam Stopped (04/09/22 2153)   phenytoin (DILANTIN) IV 125 mg (04/11/22 0809)   sodium chloride, albuterol, diclofenac Sodium, fentaNYL, hydrALAZINE, LORazepam, metoprolol tartrate, mouth rinse, sodium chloride flush

## 2022-04-11 NOTE — Plan of Care (Signed)
  Problem: Education: Goal: Knowledge of General Education information will improve Description: Including pain rating scale, medication(s)/side effects and non-pharmacologic comfort measures Outcome: Not Progressing   Problem: Clinical Measurements: Goal: Ability to maintain clinical measurements within normal limits will improve Outcome: Not Progressing   Problem: Activity: Goal: Risk for activity intolerance will decrease Outcome: Not Progressing   

## 2022-04-11 NOTE — Progress Notes (Signed)
Palliative:  Chart reviewed. Discussed with Dr. Tamala Julian who met with family today - Palliative team to check back in Monday.  Juel Burrow, DNP, AGNP-C Palliative Medicine Team Team Phone # [phone]  Pager # [phone]  NO CHARGE

## 2022-04-11 NOTE — Progress Notes (Signed)
Family would like no further sedation if possible and half a run of HD followed by another SBT.  I will reach out to nephro and see if we can coordinate.  Erskine Emery MD PCCM

## 2022-04-11 NOTE — Progress Notes (Signed)
ANTICOAGULATION CONSULT NOTE- follow-up  Pharmacy Consult for Heparin Indication: atrial fibrillation  Allergies  Allergen Reactions   Penicillins Itching and Rash    Did it involve swelling of the face/tongue/throat, SOB, or low BP?Y Did it involve sudden or severe rash/hives, skin peeling, or any reaction on the inside of your mouth or nose? Y Did you need to seek medical attention at a hospital or doctor's office? Y When did it last happen?  2016     If all above answers are "NO", may proceed with cephalosporin use. 04/07/22> previously tolerated ceftriaxone, cefepime    Patient Measurements: Height: '5\' 4"'$  (162.6 cm) Weight: 69.9 kg (154 lb 1.6 oz) IBW/kg (Calculated) : 54.7 Heparin Dosing Weight: 70.5 kg  Vital Signs: Temp: 97.9 F (36.6 C) (09/08 0400) Temp Source: Esophageal (09/08 0400) BP: 148/62 (09/08 0445) Pulse Rate: 79 (09/08 0445)  Labs: Recent Labs    04/09/22 0407 04/09/22 0808 04/09/22 1240 04/09/22 1314 04/09/22 2340 04/10/22 0339 04/11/22 0406  HGB 8.5*   < > 10.5*  --   --  9.0* 8.6*  HCT 26.8*   < > 31.0*  --   --  28.9* 28.3*  PLT 153  --   --   --   --  170 159  HEPARINUNFRC <0.10*  --   --    < > 0.36 0.40 0.30  CREATININE 4.97*  --   --   --   --  3.64*  --    < > = values in this interval not displayed.     Estimated Creatinine Clearance: 17 mL/min (A) (by C-G formula based on SCr of 3.64 mg/dL (H)).   Medical History: Past Medical History:  Diagnosis Date   Anemia of chronic disease    Arthritis    Deceased-donor kidney transplant    Performed at [facility], April 2010.  Initial ESRD due to HTN nephropathy   Eczema    ESRD (end stage renal disease) ([place])    M/W/F dialysis   FUO (fever of unknown origin) 05/17/2015   GERD (gastroesophageal reflux disease)    Headache(784.0)    History of hyperparathyroidism    Hypertension    Peritonitis ([place]) 10/2019   Shortness of breath    Wears glasses     Assessment: 56 yo female  with history of ESRD - HD MWF, HTN, s/p parathyroidectomy, and depression. Renal transplant in 2010 failed. Recent surgery, parathyroidectomy, on 03/18/2022. Episode of afib with RVR during HD on 9/4. No anticoagulation PTA. Pharmacy consulted for IV heparin. No evidence of bleeding on CT 9/5.   CBC stable, Hgb 9, PLT 170.   Per RN, no issues with infusion/line/no heparin holds and no s/sx of bleeding. Heparin level 0.3 (therapeutic)  Goal of Therapy:  Heparin level 0.3-0.7 units/ml Monitor platelets by anticoagulation protocol: Yes   Plan:  Continue heparin infusion 1650 units/hr Check daily heparin level Monitor daily CBC and s/sx of bleeding   Mayola Mcbain BS, PharmD, BCPS Clinical Pharmacist 04/11/2022 5:09 AM  Contact: [phone] after 3 PM  "Be curious, not judgmental..." -Jamal Maes

## 2022-04-11 NOTE — IPAL (Signed)
  Interdisciplinary Goals of Care Family Meeting   Date carried out: 04/11/2022  Location of the meeting: Unit  Member's involved: Physician, Bedside Registered Nurse, and Family Member or next of kin  Durable Power of Attorney or acting medical decision maker: Joint with siblings, kids, mother, daughter    Discussion: We discussed goals of care for Ruth Gutierrez .   "When the heart stops for a long period, the brain does not get enough oxygen and can have permanent damage.  The MRI shows extensive restricted blood flow to the patient's cortex, which is the higher part of the brain that controls memory, movement, speech, and everything that helps [place] interact with the world around [place].  The lower parts of the brain called the brainstem, control basic reflexes like gag reflex, cough, and certain eye movements.  These seem to be not as damaged as the cortex.  Overall I think there are two options forward: Full scope of treatment: we would aim to give [name] as much time as possible regardless of her level of consciousness.  This may include tracheostomy, PEG tube, and nursing home. Time limited trial of aggressive therapy: we would not put Wonder through a trach or a PEG.  If she does not wake up meaningfully by a certain date, we will allow her to pass peacefully.  I would not try [name] off the ventilator until we have decided on one of the two options above."  We agreed to re-address Monday after they have had time to process.  Appreciate palliative and neuro assistance on this tough case.  Code status: Full Code  Disposition: Continue current acute care  Time spent for the meeting: 30 mins    Candee Furbish, MD  04/11/2022, 3:48 PM

## 2022-04-11 NOTE — Progress Notes (Signed)
Subjective: No acute events overnight.  MRI brain showed anoxic brain injury.  ROS: Unable to obtain due to poor mental status  Examination  Vital signs in last 24 hours: Temp:  [97.9 F (36.6 C)-100.2 F (37.9 C)] 99.4 F (37.4 C) (09/08 0756) Pulse Rate:  [76-114] 97 (09/08 0821) Resp:  [15-27] 23 (09/08 0821) BP: (92-167)/(43-85) 152/71 (09/08 0821) SpO2:  [85 %-100 %] 100 % (09/08 0800) FiO2 (%):  [6 %-40 %] 40 % (09/08 0821) Weight:  [69.9 kg] 69.9 kg (09/08 0234)  General: lying in bed, NAD RS: intubated Neuro: briefly opens eyes and has roving eye movements but doesn't track examiner, doesn't follow commands, PERLA, corneal reflex intact, cough reflex intact, didn't withdraw to noxious stimuli in all extremities  Basic Metabolic Panel: Recent Labs  Lab 04/06/22 0359 04/06/22 1751 04/07/22 1246 04/07/22 1308 04/07/22 2010 04/08/22 0302 04/08/22 1458 04/08/22 1731 04/08/22 2015 04/09/22 0407 04/09/22 0808 04/09/22 1240 04/09/22 1645 04/10/22 0339 04/10/22 1406 04/10/22 2358 04/11/22 0406  NA 138   < > 137   < >  --  134*   < >  --   --  133* 130* 130*  --  132*  --   --  131*  K 4.3   < > 5.9*   < >  --  4.8   < >  --   --  4.0 4.0 4.2  --  3.9  --   --  3.8  CL 98   < > 98  --   --  93*  --   --   --  90*  --   --   --  91*  --   --  90*  CO2 23   < > 19*  --   --  25  --   --   --  28  --   --   --  30  --   --  26  GLUCOSE 74   < > 83  --   --  154*  --   --   --  138*  --   --   --  105*  --   --  113*  BUN 39*   < > 49*  --   --  36*  --   --   --  24*  --   --   --  18  --   --  23*  CREATININE 10.69*   < > 13.34*  --   --  8.77*  --   --   --  4.97*  --   --   --  3.64*  --   --  3.53*  CALCIUM 5.4*   < > 8.8*  --   --  7.8*   < >  --    < > 6.9*  --   --    < > 7.5*  7.5* 7.5* 7.6* 8.1*  MG 2.4  --   --   --  2.1 2.0  --  1.8  --  2.2  --   --   --   --   --   --   --   PHOS  --    < >  --   --  3.7 3.9  --  2.6  --  3.4  3.5  --   --   --  3.7  --    --  3.4   < > = values in  this interval not displayed.    CBC: Recent Labs  Lab 04/07/22 1246 04/07/22 1308 04/08/22 0838 04/08/22 1458 04/09/22 0407 04/09/22 0808 04/09/22 1240 04/10/22 0339 04/11/22 0406  WBC 14.8*  --  10.9*  --  10.6*  --   --  11.5* 11.4*  HGB 9.2*   < > 8.1*   < > 8.5* 9.5* 10.5* 9.0* 8.6*  HCT 30.2*   < > 25.5*   < > 26.8* 28.0* 31.0* 28.9* 28.3*  MCV 95.9  --  92.1  --  90.8  --   --  93.2 95.3  PLT 152  --  151  --  153  --   --  170 159   < > = values in this interval not displayed.     Coagulation Studies: No results for input(s): "LABPROT", "INR" in the last 72 hours.  Imaging  MRI brain without contrast 04/10/2022: 1. Diffuse restricted diffusion throughout the entire supratentorial cortex bilaterally compatible with diffuse hypoxic ischemic injury. 2. Chronic encephalomalacia of the anterior frontal lobes bilaterally. 3. Bilateral mastoid effusions. 4. Marked decreased marrow signal. This is nonspecific, but can be seen in the setting of anemia or chronic hypoxia.    ASSESSMENT AND PLAN:  46 old female with status epilepticus arising from right frontal region in the setting of severe hypercalcemia and prior meningioma resection.  Had cardiac arrest on 04/07/2022 and MRI brain now showing anoxic brain injury.   Refractory electrographic/nonconvulsive status epilepticus, resolved Acute anoxic encephalopathy Anoxic brain injury Cardiac arrest Fever - No definite seizures overnight   Recommendations -Continue current dose of Keppra, Vimpat, Onfi '5mg'$  daily and Phenytoin '125mg'$  Q8h  -Discussed MRI brain findings with family members.  Patient has multiple medical comorbidities.  Had difficult to control seizures on arrival and had cardiac arrest.  MRI brain now showing diffuse anoxic brain injury.  On exam, she opens her eyes but is otherwise not interactive.  During goals of care discussion, family reported that patient would not want trach and  PEG. Plan is to wait till Monday and if no improvement, will likely transition to comfort care. -In the interim we will start on amantadine 100 mg twice daily for neuro stimulation -Management of rest of comorbidities per primary team -Discussed plan with critical care team and family at bedside   I have spent a total of  60 minutes with the patient reviewing hospital notes,  test results, labs and examining the patient as well as establishing an assessment and plan.  > 50% of time was spent in direct patient care.    Zeb Comfort Epilepsy [facility] For questions after 5pm please refer to AMION to reach the Neurologist on call

## 2022-04-11 NOTE — Progress Notes (Signed)
NAME:  Ruth Gutierrez, MRN:  [id number], DOB:  24-Apr-1966, LOS: 6 ADMISSION DATE:  04/27/2022, CONSULTATION DATE:  04/07/2022 REFERRING MD:  Dr. Philipp Ovens, CHIEF COMPLAINT:  Acute Respiratory Failure, IHCA    History of Present Illness:  56 y/o female with history of ESRD s/p renal transplant on HD MWF, HTN, s/p parathyroidectomy, and depression. Admitted to ICU following cardiac arrest due to aspiration caused by seizure.  Now critically ill due to acute hypoxic respiratory failure requiring mechanical ventilation.  Pertinent  Medical History   Past Medical History:  Diagnosis Date   Anemia of chronic disease    Arthritis    Deceased-donor kidney transplant    Performed at [facility], April 2010.  Initial ESRD due to HTN nephropathy   Eczema    ESRD (end stage renal disease) ([place])    M/W/F dialysis   FUO (fever of unknown origin) 05/17/2015   GERD (gastroesophageal reflux disease)    Headache(784.0)    History of hyperparathyroidism    Hypertension    Peritonitis ([place]) 10/2019   Shortness of breath    Wears glasses      Significant Hospital Events: Including procedures, antibiotic start and stop dates in addition to other pertinent events   9/1 - admitted with AMS found to have seizures on EEG 9/3 - NG tube placed 9/4 - gradual increase in oxygen requirements, code blue at 1150, intubated and transferred to ICU. Went into afib with rvr during HD, started amio drip 9/5 - amiodarone stopped, metoprolol started, CT head unchanged no bleed, heparin gtt, remains on vent. After RR increased ABG showed hypoxia and alkalosis. Settings changed. Propofol weaned off, fentanyl added 9/6 - placed back on propofol for agitation and dyssynchrony on the vent overnight, showed cheyne stokes breathing pattern  9/7 - remains on vent and sedated, MRI for anoxic evaluation 9/8 - stable and minimally responsive, MRI showed diffuse hypoxic injury  Interim History / Subjective:  No significant  changes in status, MRI with evidence of diffuse anoxic brain injury.   Objective   Blood pressure (!) 157/70, pulse 94, temperature 99.4 F (37.4 C), temperature source Bladder, resp. rate 17, height '5\' 4"'$  (1.626 m), weight 69.9 kg, last menstrual period 06/02/2015, SpO2 99 %.    Vent Mode: PRVC FiO2 (%):  [6 %-40 %] 40 % Set Rate:  [18 bmp] 18 bmp Vt Set:  [430 mL] 430 mL PEEP:  [5 cmH20-8 cmH20] 5 cmH20 Plateau Pressure:  [16 cmH20-23 cmH20] 17 cmH20   Intake/Output Summary (Last 24 hours) at 04/11/2022 0811 Last data filed at 04/11/2022 0700 Gross per 24 hour  Intake 2264.36 ml  Output 3313 ml  Net -1048.64 ml   Filed Weights   04/10/22 0500 04/10/22 0740 04/11/22 0234  Weight: 71 kg 71 kg 69.9 kg    Examination: General: Ill appearing female laying in bed, intubated and sedated HENT: [place]/AT, Pupils nonreactive Lungs: Ventilated breath sounds bilaterally, CTA Cardiovascular: RRR Abdomen: soft Extremities: Minimal LE edema Neuro: Sedated RASS -4, no gag reflex, abnormal oculocephalic reflex, no response to painful stimuli, no cough reflex, abnormal arm drop test  Resolved Hospital Problem list     Assessment & Plan:  Acute Respiratory Failure ARDS 2/2 aspiration IHCA  Pt had a seizure, agonal breathing, and lost a pulse on 9/4 with ROSC after 19 minutes of CPR. Intubated and sedated. CXR with diffuse infiltrates. TTM orders in if needed, patient has been normothermic after short period of fevers on 9/4. Showed Cheyne-Stokes  breathing pattern yesterday causing desynchrony, did well for a bit on pressure support, sedation increased and back on PRVC. ABG stable without acidosis.  - Continue vent management and attempt to wean sedation as possible - continue ceftriaxone until 9/9   Status Epilepticus Anoxic Brain Injury Suspected bifrontal region origin. On propofol, phenytoin, keppra, and vimpat. Neurology following. No seizures on EEG for 48 hours plus, EEG d/c'd. MRI showed  diffuse anoxic brain injury. We are discussing with Neuro about any potential reversible causes, we appreciate their recommendations.  - medication per neurology   Hypocalcemia Post Parathyroidectomy Calcium of 5.5, ionized 0.62 on admission. Responded well to aggressive repletion. Corrected calcium 9.6 today. - Continue with repletion  - Will need high Ca++ bath for outpatient dialysis   Acid-Base Imbalance Gap of 20 on transfer with bicarb of 19. Delta delta originally showed pure AGMA with lactate of 6.9. ABG showed pH of 7.248 with elevated pCO2 and bicarb of 21, RR increased and as sedation was weaned patient was breathing over the vent. Repeat ABG pH of 7.568, low pO2, normal pCO2, and elevated bicarb, RR decreased, PEEP and FiO2 increased. ABG pH 7.494, low pO2, normal pCO2, and elevated bicarb. Gap of 15 today. Pt on calcium carbonate. ABG pH 7.419, pCO2 44.2, pO2 76, bicarb 28.6 - CTM   ESRD on HD MWF Renal transplant in 2010, failed. Episode of Afib with RVR during HD on 9/4, prematurely ended HD. - Continue HD per nephrology   EKG Abnormalities ST elevations seen on telemetry. Unable to assess for symptoms, hemodynamically stable, troponin likely elevated post CPR. EKG showed possible PR depression and biphasic P waves in V1 and V2. Question of inverted T waves in II/III. Echo in 10/2020 showed moderate concentric LVH with EF of 60-65%, mild left atrial dilation, mild elevation in pulmonary artery pressure, moderate TR, and right atrial pressure of 3 mmHg. Echo showed EF 65%, mild concentric LVH, AV sclerosis without stenosis, and RAP 3 mmHg.   Afib w/ RVR Episode during HD on 9/4. Amiodarone gtt started and then stopped on 9/6. Stable for the past few days on BB. - continue metoprolol  - heparin gtt   Hyperthyroidism TSH undetectable with elevated free T4 and normal T3 initially, now normal free T4 and low T3. This pattern likely represents subclinical hyperthyroidism with some  transient effects of amiodarone. As we could not do a RAIU scan due to recent amiodarone bolus and with no reported hyperthyroid symptoms prior to admission we elected not to treat. Trab and TSH ab negative.    Hypertension Previously on amlodipine 10 mg daily. Has been on cleviprex gtt intermittently. BB as above. We will continue to monitor. - PRN hydralazine   Hypoglycemia Pt was hypoglycemic overnight 9/6 and d10 gtt was added, recurred overnight 9/8, D10 restarted. Off tube feeds still, will continue to monitor. Will resumed TF based on goals of care conversation today.   Advanced Care Planning Pt is critically ill with evidence of anoxic brain injury and minimal response. She has a very poor prognosis and we will have discussions with the family today about her prognosis and will involve palliative if needed.    Best Practice (right click and "Reselect all SmartList Selections" daily)    Diet/type: tubefeeds on hold DVT prophylaxis: systemic heparin GI prophylaxis: PPI Lines: Central line Foley:  N/A Code Status:  full code Last date of multidisciplinary goals of care discussion [9/7]   Labs   CBC: Recent Labs  Lab 04/07/22  1246 04/07/22 1308 04/08/22 0838 04/08/22 1458 04/09/22 0407 04/09/22 0808 04/09/22 1240 04/10/22 0339 04/11/22 0406  WBC 14.8*  --  10.9*  --  10.6*  --   --  11.5* 11.4*  HGB 9.2*   < > 8.1*   < > 8.5* 9.5* 10.5* 9.0* 8.6*  HCT 30.2*   < > 25.5*   < > 26.8* 28.0* 31.0* 28.9* 28.3*  MCV 95.9  --  92.1  --  90.8  --   --  93.2 95.3  PLT 152  --  151  --  153  --   --  170 159   < > = values in this interval not displayed.    Basic Metabolic Panel: Recent Labs  Lab 04/06/22 0359 04/06/22 1751 04/07/22 1246 04/07/22 1308 04/07/22 2010 04/08/22 0302 04/08/22 1458 04/08/22 1731 04/08/22 2015 04/09/22 0407 04/09/22 0808 04/09/22 1240 04/09/22 1645 04/09/22 2134 04/10/22 0339 04/10/22 1406 04/10/22 2358 04/11/22 0406  NA 138   < >  137   < >  --  134*   < >  --   --  133* 130* 130*  --   --  132*  --   --  131*  K 4.3   < > 5.9*   < >  --  4.8   < >  --   --  4.0 4.0 4.2  --   --  3.9  --   --  3.8  CL 98   < > 98  --   --  93*  --   --   --  90*  --   --   --   --  91*  --   --  90*  CO2 23   < > 19*  --   --  25  --   --   --  28  --   --   --   --  30  --   --  26  GLUCOSE 74   < > 83  --   --  154*  --   --   --  138*  --   --   --   --  105*  --   --  113*  BUN 39*   < > 49*  --   --  36*  --   --   --  24*  --   --   --   --  18  --   --  23*  CREATININE 10.69*   < > 13.34*  --   --  8.77*  --   --   --  4.97*  --   --   --   --  3.64*  --   --  3.53*  CALCIUM 5.4*   < > 8.8*  --   --  7.8*   < >  --    < > 6.9*  --   --    < > 7.7* 7.5*  7.5* 7.5* 7.6* 8.1*  MG 2.4  --   --   --  2.1 2.0  --  1.8  --  2.2  --   --   --   --   --   --   --   --   PHOS  --    < >  --   --  3.7 3.9  --  2.6  --  3.4  3.5  --   --   --   --  3.7  --   --  3.4   < > = values in this interval not displayed.   GFR: Estimated Creatinine Clearance: 17.5 mL/min (A) (by C-G formula based on SCr of 3.53 mg/dL (H)). Recent Labs  Lab 04/07/22 1527 04/07/22 2010 04/08/22 0838 04/09/22 0407 04/09/22 0916 04/09/22 1054 04/10/22 0339 04/11/22 0406  WBC  --   --  10.9* 10.6*  --   --  11.5* 11.4*  LATICACIDVEN 1.7 2.3*  --   --  0.7 1.2  --   --     Liver Function Tests: Recent Labs  Lab 04/08/2022 2019 04/05/22 0219 04/07/22 0318 04/07/22 1246 04/08/22 0302 04/09/22 0407 04/09/22 1054 04/10/22 0339 04/11/22 0406  AST 19 16 13* 116*  --   --   --   --   --   ALT '19 17 13 '$ 90*  --   --   --   --   --   ALKPHOS 648* 613* 573* 650*  --   --   --   --   --   BILITOT 0.8 0.7 0.6 0.7  --   --   --   --   --   PROT 7.3 6.5 6.5 6.9  --   --   --   --   --   ALBUMIN 2.8* 2.7* 2.6* 2.6* 2.1* 2.5* 2.6* 2.4* 2.1*   No results for input(s): "LIPASE", "AMYLASE" in the last 168 hours. No results for input(s): "AMMONIA" in the last 168  hours.  ABG    Component Value Date/Time   PHART 7.419 04/09/2022 1240   PCO2ART 44.2 04/09/2022 1240   PO2ART 76 (L) 04/09/2022 1240   HCO3 28.6 (H) 04/09/2022 1240   TCO2 30 04/09/2022 1240   ACIDBASEDEF 6.0 (H) 04/07/2022 1308   O2SAT 95 04/09/2022 1240     Coagulation Profile: Recent Labs  Lab 04/05/22 0219  INR 1.5*    Cardiac Enzymes: No results for input(s): "CKTOTAL", "CKMB", "CKMBINDEX", "TROPONINI" in the last 168 hours.  HbA1C: Hemoglobin A1C  Date/Time Value Ref Range Status  12/20/2020 10:01 AM 5.0 4.0 - 5.6 % Final    CBG: Recent Labs  Lab 04/10/22 1511 04/10/22 1554 04/10/22 2011 04/10/22 2353 04/11/22 0404  GLUCAP 76 124* 134* 110* 99    Review of Systems:   Unable to perform.  Past Medical History:  She,  has a past medical history of Anemia of chronic disease, Arthritis, Deceased-donor kidney transplant, Eczema, ESRD (end stage renal disease) ([place]), FUO (fever of unknown origin) (05/17/2015), GERD (gastroesophageal reflux disease), Headache(784.0), History of hyperparathyroidism, Hypertension, Peritonitis ([place]) (10/2019), Shortness of breath, and Wears glasses.   Surgical History:   Past Surgical History:  Procedure Laterality Date   A/V FISTULAGRAM Left 02/12/2017   Procedure: A/V Fistulagram;  Surgeon: Algernon Huxley, MD;  Location: [place] [facility];  Service: Cardiovascular;  Laterality: Left;   A/V FISTULAGRAM Left 12/30/2017   Procedure: A/V FISTULAGRAM;  Surgeon: Algernon Huxley, MD;  Location: [place] [facility];  Service: Cardiovascular;  Laterality: Left;   A/V FISTULAGRAM Left 04/01/2021   Procedure: A/V FISTULAGRAM;  Surgeon: Algernon Huxley, MD;  Location: [place] [facility];  Service: Cardiovascular;  Laterality: Left;   A/V SHUNT INTERVENTION N/A 02/12/2017   Procedure: A/V Shunt Intervention;  Surgeon: Algernon Huxley, MD;  Location: [place] [facility];  Service: Cardiovascular;  Laterality: N/A;   AV FISTULA PLACEMENT      [place]  Left 10/30/2014   Procedure: LEFT BASCILIC VEIN TRANSPOSITION;  Surgeon: Rosetta Posner, MD;  Location: [place];  Service: Vascular;  Laterality: Left;   [place] Left 01/03/2015   Procedure: LEFT ARM 2ND STAGE [place];  Surgeon: Rosetta Posner, MD;  Location: [place];  Service: Vascular;  Laterality: Left;   BIOPSY  10/23/2021   Procedure: BIOPSY;  Surgeon: Jackquline Denmark, MD;  [facility];  Service: Gastroenterology;;   BREAST BIOPSY Left    Patient doesn't remember any information from previous procedure.   CAPD REMOVAL N/A 10/31/2019   Procedure: PERITONEAL DIALYSIS  (CAPD) INFECTED CATHETER REMOVAL;  Surgeon: Coralie Keens, MD;  Location: [place];  Service: General;  Laterality: N/A;   CRANIOTOMY N/A 11/01/2020   Procedure: CRANIOTOMY FOR TUMOR EXCISION;  Surgeon: [name], MD;  Location: [place];  Service: Neurosurgery;  Laterality: N/A;   ESOPHAGOGASTRODUODENOSCOPY (EGD) WITH PROPOFOL N/A 10/23/2021   Procedure: ESOPHAGOGASTRODUODENOSCOPY (EGD) WITH PROPOFOL;  Surgeon: Jackquline Denmark, MD;  Location: [place];  Service: Gastroenterology;  Laterality: N/A;   FRACTURE SURGERY     left foot,baby toe nad next toe missing   INSERTION OF DIALYSIS CATHETER Right 10/30/2014   Procedure: INSERTION OF DIALYSIS CATHETER;  Surgeon: Rosetta Posner, MD;  Location: [place];  Service: Vascular;  Laterality: Right;   KIDNEY TRANSPLANT  11/02/2008   [facility])   PARATHYROIDECTOMY N/A 03/18/2022   Procedure: TOTAL PARATHYROIDECTOMY;  Surgeon: Armandina Gemma, MD;  Location: [place];  Service: General;  Laterality: N/A;   PLACEMENT OF LUMBAR DRAIN N/A 11/01/2020   Procedure: PLACEMENT OF LUMBAR DRAIN;  Surgeon: [name], MD;  Location: [place];  Service: Neurosurgery;  Laterality: N/A;   WISDOM TOOTH EXTRACTION       Social History:   reports that she has been smoking cigarettes. She has a 13.50 pack-year smoking  history. She has never used smokeless tobacco. She reports that she does not drink alcohol and does not use drugs.   Family History:  Her family history includes Deep vein thrombosis in her brother; Diabetes in her brother; Hyperlipidemia in her sister; Hypertension in her father, mother, sister, and sister; Kidney disease in her brother. There is no history of Colon cancer, Esophageal cancer, Rectal cancer, or Breast cancer.   Allergies Allergies  Allergen Reactions   Penicillins Itching and Rash    Did it involve swelling of the face/tongue/throat, SOB, or low BP?Y Did it involve sudden or severe rash/hives, skin peeling, or any reaction on the inside of your mouth or nose? Y Did you need to seek medical attention at a hospital or doctor's office? Y When did it last happen?  2016     If all above answers are "NO", may proceed with cephalosporin use. 04/07/22> previously tolerated ceftriaxone, cefepime     Home Medications  Prior to Admission medications   Medication Sig Start Date End Date Taking? Authorizing Provider  acetaminophen (TYLENOL) 500 MG tablet Take 500 mg by mouth every 6 (six) hours as needed.   Yes [provider]  albuterol (VENTOLIN HFA) 108 (90 Base) MCG/ACT inhaler Inhale 3 puffs into the lungs daily as needed for wheezing or shortness of breath. 11/12/21 04/05/22 Yes Gaylan Gerold, DO  amLODipine (NORVASC) 10 MG tablet Take 1 tablet (10 mg total) by mouth daily. 07/02/21  Yes Lajean Manes, MD  cloNIDine (CATAPRES) 0.1 MG tablet Take 0.1 mg by mouth 2 (two) times daily. 04/01/22  Yes [provider]  diclofenac Sodium (VOLTAREN) 1 % GEL Apply 2 g topically 2 (two) times daily as needed (Right shoulder pain). Patient taking differently: Apply 1 Application topically daily as needed (Right shoulder pain). 01/20/22  Yes Gaylan Gerold, DO  diphenhydrAMINE (BENADRYL) 25 mg capsule Take 50 mg by mouth 3 (three) times daily as needed for allergies.   Yes [provider]  lidocaine-prilocaine (EMLA) cream Apply 1 application. topically every Monday, Wednesday, and Friday with hemodialysis. 02/01/20  Yes [provider]  sevelamer carbonate (RENVELA) 800 MG tablet Take 1,600 mg by mouth 3 (three) times daily with meals.   Yes [provider]  calcitRIOL (ROCALTROL) 0.5 MCG capsule Take 4 capsules (2 mcg total) by mouth 2 (two) times daily between meals. Patient not taking: Reported on 04/05/2022 03/31/22   Sanjuan Dame, MD  Calcium Carbonate Antacid (CALCIUM CARBONATE, DOSED IN MG ELEMENTAL CALCIUM,) 1250 MG/5ML SUSP Take 24 mLs (2,400 mg of elemental calcium total) by mouth 4 (four) times daily. Patient not taking: Reported on 04/05/2022 03/31/22   Sanjuan Dame, MD  pantoprazole (PROTONIX) 40 MG tablet Take 1 tablet (40 mg total) by mouth 2 (two) times daily before a meal. Patient not taking: Reported on 03/19/2022 10/24/21 02/13/22  Rosezetta Schlatter, MD     Critical care time: 46

## 2022-04-12 ENCOUNTER — Inpatient Hospital Stay (HOSPITAL_COMMUNITY): Payer: Medicare Other

## 2022-04-12 DIAGNOSIS — G9341 Metabolic encephalopathy: Secondary | ICD-10-CM | POA: Diagnosis not present

## 2022-04-12 LAB — GLUCOSE, CAPILLARY
Glucose-Capillary: 200 mg/dL — ABNORMAL HIGH (ref 70–99)
Glucose-Capillary: 121 mg/dL — ABNORMAL HIGH (ref 70–99)
Glucose-Capillary: 106 mg/dL — ABNORMAL HIGH (ref 70–99)
Glucose-Capillary: 128 mg/dL — ABNORMAL HIGH (ref 70–99)
Glucose-Capillary: 132 mg/dL — ABNORMAL HIGH (ref 70–99)
Glucose-Capillary: 146 mg/dL — ABNORMAL HIGH (ref 70–99)

## 2022-04-12 LAB — CBC
HCT: 28.4 % — ABNORMAL LOW (ref 36.0–46.0)
Hemoglobin: 8.9 g/dL — ABNORMAL LOW (ref 12.0–15.0)
MCH: 28.9 pg (ref 26.0–34.0)
MCHC: 31.3 g/dL (ref 30.0–36.0)
MCV: 92.2 fL (ref 80.0–100.0)
Platelets: 167 10*3/uL (ref 150–400)
RBC: 3.08 MIL/uL — ABNORMAL LOW (ref 3.87–5.11)
RDW: 18.8 % — ABNORMAL HIGH (ref 11.5–15.5)
WBC: 12.6 10*3/uL — ABNORMAL HIGH (ref 4.0–10.5)
nRBC: 0.2 % (ref 0.0–0.2)

## 2022-04-12 LAB — RENAL FUNCTION PANEL
Albumin: 1.9 g/dL — ABNORMAL LOW (ref 3.5–5.0)
Anion gap: 14 (ref 5–15)
BUN: 14 mg/dL (ref 6–20)
CO2: 26 mmol/L (ref 22–32)
Calcium: 7.8 mg/dL — ABNORMAL LOW (ref 8.9–10.3)
Chloride: 92 mmol/L — ABNORMAL LOW (ref 98–111)
Creatinine, Ser: 1.95 mg/dL — ABNORMAL HIGH (ref 0.44–1.00)
GFR, Estimated: 30 mL/min — ABNORMAL LOW (ref >60)
Glucose, Bld: 138 mg/dL — ABNORMAL HIGH (ref 70–99)
Phosphorus: 1.5 mg/dL — ABNORMAL LOW (ref 2.5–4.6)
Potassium: 3.2 mmol/L — ABNORMAL LOW (ref 3.5–5.1)
Sodium: 132 mmol/L — ABNORMAL LOW (ref 135–145)

## 2022-04-12 LAB — PHOSPHORUS: Phosphorus: 1.6 mg/dL — ABNORMAL LOW (ref 2.5–4.6)

## 2022-04-12 LAB — HEPARIN LEVEL (UNFRACTIONATED): Heparin Unfractionated: 0.42 IU/mL (ref 0.30–0.70)

## 2022-04-12 MED ORDER — PHENYTOIN 100 MG/4ML PO SUSP
125.0000 mg | Freq: Three times a day (TID) | ORAL | Status: DC
Start: 2022-04-12 — End: 2022-04-13
  Administered 2022-04-12: 125 mg
  Filled 2022-04-12: qty 5

## 2022-04-12 MED ORDER — LIP MEDEX EX OINT
TOPICAL_OINTMENT | CUTANEOUS | Status: DC | PRN
  Filled 2022-04-12: qty 7

## 2022-04-12 MED ORDER — PHENYTOIN 125 MG/5ML PO SUSP
125.0000 mg | Freq: Three times a day (TID) | ORAL | Status: DC
  Administered 2022-04-12: 125 mg
  Filled 2022-04-12 (×5): qty 5

## 2022-04-12 MED ORDER — SODIUM CHLORIDE 0.9 % IV SOLN
150.0000 mg | Freq: Two times a day (BID) | INTRAVENOUS | Status: AC
Start: 2022-04-12 — End: 2022-04-12
  Administered 2022-04-12: 150 mg via INTRAVENOUS
  Filled 2022-04-12 (×2): qty 15

## 2022-04-12 MED ORDER — LACOSAMIDE 50 MG PO TABS
150.0000 mg | ORAL_TABLET | Freq: Two times a day (BID) | ORAL | Status: DC
  Administered 2022-04-12 – 2022-04-16 (×8): 150 mg
  Filled 2022-04-12 (×8): qty 3

## 2022-04-12 MED ORDER — K PHOS MONO-SOD PHOS DI & MONO 155-852-130 MG PO TABS
500.0000 mg | ORAL_TABLET | Freq: Once | ORAL | Status: AC
  Administered 2022-04-12: 500 mg
  Filled 2022-04-12: qty 2

## 2022-04-12 MED ORDER — POTASSIUM CHLORIDE 20 MEQ PO PACK
40.0000 meq | PACK | Freq: Once | ORAL | Status: AC
  Administered 2022-04-12: 40 meq
  Filled 2022-04-12: qty 2

## 2022-04-12 NOTE — Progress Notes (Signed)
ANTICOAGULATION CONSULT NOTE- follow-up  Pharmacy Consult for Heparin Indication: atrial fibrillation  Allergies  Allergen Reactions   Penicillins Itching and Rash    Did it involve swelling of the face/tongue/throat, SOB, or low BP?Y Did it involve sudden or severe rash/hives, skin peeling, or any reaction on the inside of your mouth or nose? Y Did you need to seek medical attention at a hospital or doctor's office? Y When did it last happen?  2016     If all above answers are "NO", may proceed with cephalosporin use. 04/07/22> previously tolerated ceftriaxone, cefepime    Patient Measurements: Height: '5\' 4"'$  (162.6 cm) Weight: 69.9 kg (154 lb 1.6 oz) IBW/kg (Calculated) : 54.7 Heparin Dosing Weight: 70.5 kg  Vital Signs: Temp: 99 F (37.2 C) (09/09 0327) Temp Source: Esophageal (09/09 0327) BP: 172/77 (09/09 0536) Pulse Rate: 95 (09/09 0300)  Labs: Recent Labs    04/10/22 0339 04/11/22 0406 04/12/22 0150  HGB 9.0* 8.6* 8.9*  HCT 28.9* 28.3* 28.4*  PLT 170 159 167  HEPARINUNFRC 0.40 0.30 0.42  CREATININE 3.64* 3.53* 1.95*    Estimated Creatinine Clearance: 31.7 mL/min (A) (by C-G formula based on SCr of 1.95 mg/dL (H)).   Medical History: Past Medical History:  Diagnosis Date   Anemia of chronic disease    Arthritis    Deceased-donor kidney transplant    Performed at [facility], April 2010.  Initial ESRD due to HTN nephropathy   Eczema    ESRD (end stage renal disease) ([place])    M/W/F dialysis   FUO (fever of unknown origin) 05/17/2015   GERD (gastroesophageal reflux disease)    Headache(784.0)    History of hyperparathyroidism    Hypertension    Peritonitis ([place]) 10/2019   Shortness of breath    Wears glasses     Assessment: 56 yo female with history of ESRD - HD MWF, HTN, s/p parathyroidectomy, and depression. Renal transplant in 2010 failed. Recent surgery, parathyroidectomy, on 03/18/2022. Episode of afib with RVR during HD on 9/4. No anticoagulation  PTA. Pharmacy consulted for IV heparin. No evidence of bleeding on CT 9/5.   CBC stable, Hgb 9, PLT 170.  Per RN, no issues with infusion/line/no heparin holds and no s/sx of bleeding. Heparin level 0.42 (therapeutic)  Goal of Therapy:  Heparin level 0.3-0.7 units/ml Monitor platelets by anticoagulation protocol: Yes   Plan:  Continue heparin infusion 1650 units/hr Check daily heparin level Monitor daily CBC and s/sx of bleeding   Sloan Leiter, PharmD, BCPS, BCCCP Clinical Pharmacist Please refer to [facility] for [place] numbers 04/12/2022 7:26 AM

## 2022-04-12 NOTE — Progress Notes (Signed)
[facility] Progress Note  Subjective: Ca 7.8 this am, getting po CaCO3 tid via NG. No improvement in responsiveness.   Vitals:   04/12/22 0830 04/12/22 0900 04/12/22 0930 04/12/22 1135  BP: (!) 174/74 (!) 160/66 (!) 141/57   Pulse: (!) 104 99 94   Resp: (!) 28 (!) 24 (!) 24   Temp:    98.6 F (37 C)  TempSrc:    Esophageal  SpO2: 100% 100% 100%   Weight:      Height:        Exam: Gen eyes open but not following commands On vent Sclera anicteric, throat w/ ETT No jvd or bruits Chest clear anterior/ lateral RRR no MRG Abd soft ntnd no mass or ascites +bs Ext no sig edema Neuro is on vent   LUE AVF +bruit  [facility] HD: [facility]  4h  65kg  2/3 bath  LUE AVF  Hep none - hectorol 4 u g tiw - venofer '100mg'$   - mircera 225 q 2, last 8/30    Summary: 56 y.o. female ESRD, h/o transplant, PTX, HTN, depression p/w AMS.  Rx only 1hr73mn of HD on 9/1 bec of lethargy and confusion  noted to be somnolent and initially thought that Ativan was contributing. Pt has been confused for 1-2 days; recently had a parathyroidecomy on 8/15 and was treated for hypocalcemia subsequently. Her regimen was supposed to be  liquid CaCO3 '2400mg'$  suspension 4x daily but according to the mother she did not tolerate this in the hospital last admission and it was confirmed with mother and daughter (lives w/ daughter) she was not taking any Ca at home.  Ca noted to be 5.5 in the ED with a pCO2 of 18. Patient had facial twitching in the ED with subsequent MRI of the brain showed gyriform diffusion restriction of the anterior right frontal lob suggestive of acute seizure/status epilepticus. EEG confirmed that it was coming from the right frontal lobe. Pt admitted, we were asked to see for ESRD.     CXR 9/6- gross cardiomegaly, extensive bilateral IS and consolidative airspace opacity with layering pleural effusions   Assessment/ Plan: SP cardiac arrest - on 9/4 AMS - MRI 9/07 showed signs of significant  ABI VDRF - w/ bilat infiltrates by CXR and IS edema, persistent 9/6 Afib/ RVR - getting per tube cardizem/ metoprolol, HR's 80-90s, NSR.  Volume - up 10kg at peak, w/ serial HD still up 4kg.  D510 dc'd and IV seizure meds now switched to po which will help at a lot w/ volume.  ESRD - HD MWF. Had daily HD this week for vol overload. Next HD Monday.  Hypocalcemia/ hungry bone syndrome sp recent total parathyroidectomy 8/15. Pt dc'd on CaCO3 2400 elemental qid, but per the family she was not taking any calcium at home. Pt here now w/ seizures and hypocalcemia. Ca++ levels better now after IV boluses and is now back on high dose CaCO3 per NG.  - no high Ca++ bath available here, but will need at dc  - cont IV vdra (high-dose hectorol) 12 ug tiw IV - Ca 7.8 today today, stable - continue po CaCO3 at '3000mg'$  elemental tid between meals - no IV Ca gluc today - cont daily RFP and afternoon serum Ca for now  HTN - sp Cleviprex gtt, getting cardizem and metoprolol po now, as above Seizures - f/b neurology, getting Keppra, Vimpat, dilantin and Onfi, all po now Anemia esrd -Hgb 8's, last op mircera 2238m on  8/30, next esa due 9/13.  Secondary hyperparathyroidism - see as above hypocalcemia post PTX. Binders are on hold post PTX and phos is stable.  History of meningioma - status post resection 11/01/2020  Rob Jonnie Finner 04/12/2022, 12:08 PM   Recent Labs  Lab 04/11/22 0406 04/11/22 1400 04/12/22 0150 04/12/22 1015  HGB 8.6*  --  8.9*  --   ALBUMIN 2.1*  --  1.9*  --   CALCIUM 8.1* 6.9* 7.8*  --   PHOS 3.4  --  1.5* 1.6*  CREATININE 3.53*  --  1.95*  --   K 3.8  --  3.2*  --     No results for input(s): "IRON", "TIBC", "FERRITIN" in the last 168 hours. Inpatient medications:  acetaminophen  650 mg Per Tube Q4H   Or   acetaminophen (TYLENOL) oral liquid 160 mg/5 mL  650 mg Per Tube Q4H   Or   acetaminophen  650 mg Rectal Q4H   amantadine  100 mg Per Tube BID   bisacodyl  10 mg Rectal Q0600    calcium carbonate (dosed in mg elemental calcium)  3,000 mg of elemental calcium Per Tube TID BM   Chlorhexidine Gluconate Cloth  6 each Topical Q0600   cloBAZam  5 mg Per Tube Daily   diltiazem  60 mg Per Tube Q6H   docusate  100 mg Per Tube BID   doxercalciferol  12 mcg Intravenous Q M,W,F-HD   feeding supplement (PROSource TF20)  60 mL Per Tube Daily   lacosamide  150 mg Per Tube BID   levETIRAcetam  1,000 mg Per Tube Daily   metoprolol tartrate  25 mg Per Tube BID   mouth rinse  15 mL Mouth Rinse Q2H   pantoprazole  40 mg Per Tube Daily   phenytoin  125 mg Per Tube TID   polyethylene glycol  17 g Per Tube Daily   sodium chloride flush  10-40 mL Intracatheter Q12H    sodium chloride 10 mL/hr at 04/11/22 0010   feeding supplement (VITAL 1.5 CAL) 40 mL/hr at 04/12/22 0900   fentaNYL infusion INTRAVENOUS Stopped (04/11/22 1646)   heparin 1,650 Units/hr (04/12/22 0900)   levETIRAcetam Stopped (04/10/22 1348)   sodium chloride, albuterol, diclofenac Sodium, fentaNYL, hydrALAZINE, lip balm, LORazepam, metoprolol tartrate, mouth rinse, sodium chloride flush

## 2022-04-12 NOTE — Progress Notes (Addendum)
NAME:  Ruth Gutierrez, MRN:  [id number], DOB:  1966/05/17, LOS: 7 ADMISSION DATE:  04/11/2022, CONSULTATION DATE:  04/07/2022 REFERRING MD:  Dr. Philipp Ovens, CHIEF COMPLAINT:  Acute Respiratory Failure, IHCA    History of Present Illness:  56 y/o female with history of ESRD s/p renal transplant on HD MWF, HTN, s/p parathyroidectomy, and depression. Admitted to [facility] on 9/1 with altered mental status from HD in the setting of seizures.  Hx of craniotomy in 10/2020 for tumor resection.  Developed increasing O2 needs, cardiac arrest on 9/4 in setting of aspiration after seizure. Tx to ICU on vent.  Critically ill due to acute hypoxic respiratory failure requiring mechanical ventilation.  Pertinent  Medical History  Anemia of Chronic Disease Arthritis  Deceased-Donor Kidney Transplant - [facility] ESRD on HD MWF GERD  Hyperparathyroidism  HTN  SOB  Wears Glasses  Significant Hospital Events: Including procedures, antibiotic start and stop dates in addition to other pertinent events   9/1 Admitted with AMS found to have seizures on EEG 9/3 NGT placed 9/4 Gradual increase in oxygen requirements, code blue at 1150, intubated and transferred to ICU. Went into afib with rvr during HD, started amio drip 9/5 Amiodarone stopped, metoprolol started, CT head unchanged no bleed, heparin gtt, remains on vent. After RR increased ABG showed hypoxia and alkalosis. Settings changed. Propofol weaned off, fentanyl added 9/6 Placed back on propofol for agitation and dyssynchrony on the vent overnight, showed cheyne stokes breathing pattern  9/7 Remains on vent and sedated, MRI for anoxic evaluation 9/8 Stable and minimally responsive, MRI showed diffuse hypoxic injury  Interim History / Subjective:  Tmax 99 / WBC 12.6 PEEP 5 / FiO2 40% I/O +942 ml in last 24 hours.  HD on 9/8 pulled 3L RN reports pt tolerating TF, ongoing abnormal motor movements off fentanyl   Objective   Blood pressure (!) 172/77, pulse 95,  temperature 99 F (37.2 C), temperature source Esophageal, resp. rate 18, height '5\' 4"'$  (1.626 m), weight 69.9 kg, last menstrual period 06/02/2015, SpO2 100 %.    Vent Mode: PRVC FiO2 (%):  [40 %] 40 % Set Rate:  [18 bmp] 18 bmp Vt Set:  [430 mL] 430 mL PEEP:  [5 cmH20] 5 cmH20 Plateau Pressure:  [13 cmH20-18 cmH20] 17 cmH20   Intake/Output Summary (Last 24 hours) at 04/12/2022 0740 Last data filed at 04/12/2022 0600 Gross per 24 hour  Intake 1394.47 ml  Output 451.7 ml  Net 942.77 ml   Filed Weights   04/10/22 0740 04/11/22 0234 04/11/22 2245  Weight: 71 kg 69.9 kg 69.9 kg    Examination: General: critically ill appearing adult female lying in bed on vent HEENT: MM pink/moist, ETT, anicteric, pupils 59m non-reactive, biting on tongue despite bite block  Neuro: off sedation, abnormal motor movements noted, moves eyes around but does not make eye contact, +cough/gag with deep suction, no withdrawal from pain, does not follow commands, unable to hold extremities up against gravity  CV: s1s2 RRR, no m/r/g PULM: non-labored at rest, lungs bilaterally coarse, thin clear/white secretions from ballard GI: soft, bsx4 active  Extremities: warm/dry, dependent edema, LUE AVF with +thrill/bruit Skin: no rashes or lesions  Resolved Hospital Problem list     Assessment & Plan:   Acute Hypoxic Respiratory Failure in setting of Cardiac Arrest  Aspiration Event with Pneumonitis / ARDS Seizure, agonal breathing, and lost a pulse on 9/4 with ROSC after 19 minutes of CPR. Intubated and sedated. CXR with diffuse infiltrates. Showed  Cheyne-Stokes breathing pattern 9/7 causing desynchrony, did well for a bit on pressure support, sedation increased and back on PRVC. ABG stable without acidosis.  -PRVC with LTVV  -wean PEEP / FiO2 for sats >90% -continue ceftriaxone through 9/9  -follow intermittent CXR   Status Epilepticus  Hx Craniotomy 10/3020 for Tumor Resection  Anoxic Brain Injury after  Cardiac Arrest  Cortex impacted on MRI, diffuse injury.  No seiuzres on EEG x48 hours, cEEG stopped.  -appreciate Neurology  -reverse any reversible causes for encephalopathy  -monitor off sedation but concern she may develop rhabdo with motor movements, follow -AED's per Neurology  -follow neuro exam  -seizure precautions   Hypocalcemia Post Parathyroidectomy  -monitor Ca+, replace as appropriate  ESRD on HD MWF AGMA - resolved  Hypokalemia, Hypophosphatemia  Hx of cadaver transplant in 2010, failed.  AFwRVR on HD during HD, prematurely ending HD.  -iHD per Nephrology  -Trend BMP / urinary output -Replace electrolytes as indicated  EKG Abnormalities ST elevations seen on telemetry. Unable to assess for symptoms, hemodynamically stable, troponin likely elevated post CPR. EKG showed possible PR depression and biphasic P waves in V1 and V2. Question of inverted T waves in II/III. Echo in 10/2020 showed moderate concentric LVH with EF of 60-65%, mild left atrial dilation, mild elevation in pulmonary artery pressure, moderate TR, and right atrial pressure of 3 mmHg. Echo showed EF 65%, mild concentric LVH, AV sclerosis without stenosis, and RAP 3 mmHg. -tele monitoring    AF w/ RVR Episode during HD on 9/4. Amiodarone gtt started and then stopped on 9/6. Stable for the past few days on BB. -continue metoprolol, cardizem  -heparin infusion  -tele monitoring  -family requested SCD's, reviewed not indicated but will order at their request   Hypertension  Amlodipine PTA. Intermittently requiring cleviprex infusion, beta blocker -metoprolol, cardizem   Hyperthyroidism TSH undetectable with elevated free T4 and normal T3 initially, now normal free T4 and low T3. This pattern likely represents subclinical hyperthyroidism with some transient effects of amiodarone. As we could not do a RAIU scan due to recent amiodarone bolus and with no reported hyperthyroid symptoms prior to admission we  elected not to treat. Trab and TSH ab negative.  -follow up as appropriate in 4-6 weeks   Hypoglycemia  Episode 9/6, 9/8 requiring D10 infusion  -tolerating TF's, glucose stable.  Stop D10  -monitor glucose   Goals of Care  Mother at bedside, updated on plan of care. Indicated she does not want her to suffer.  She asked how she would know if she was making the right decision (implying she wouldn't want trach / long term support etc).  We reviewed that Abel's brain has effectively been injured and the longer she goes without waking up and "being Laxmi", the less likely it would be that she would ever recover.  She states Lannah would not want to be less than whole.  She notes her other children are really struggling with Madisun's situation.   Best Practice (right click and "Reselect all SmartList Selections" daily)  Diet/type: TF  DVT prophylaxis: systemic heparin GI prophylaxis: PPI Lines: Central line Foley:  N/A Code Status:  full code Last date of multidisciplinary goals of care discussion: 9/8 per Dr. Tamala Julian  Critical Care Time: 19 minutes    Noe Gens, MSN, APRN, NP-C, AGACNP-BC [facility] 04/12/2022, 7:46 AM   Please see [email or web address] for pager details.   From 7A-7P if no response, please call [phone] After  hours, please call ELink [phone]

## 2022-04-13 DIAGNOSIS — G9341 Metabolic encephalopathy: Secondary | ICD-10-CM | POA: Diagnosis not present

## 2022-04-13 LAB — CBC
HCT: 27.6 % — ABNORMAL LOW (ref 36.0–46.0)
Hemoglobin: 8.6 g/dL — ABNORMAL LOW (ref 12.0–15.0)
MCH: 28.7 pg (ref 26.0–34.0)
MCHC: 31.2 g/dL (ref 30.0–36.0)
MCV: 92 fL (ref 80.0–100.0)
Platelets: 215 10*3/uL (ref 150–400)
RBC: 3 MIL/uL — ABNORMAL LOW (ref 3.87–5.11)
RDW: 19.7 % — ABNORMAL HIGH (ref 11.5–15.5)
WBC: 15.4 10*3/uL — ABNORMAL HIGH (ref 4.0–10.5)
nRBC: 0.1 % (ref 0.0–0.2)

## 2022-04-13 LAB — RENAL FUNCTION PANEL
Albumin: 1.9 g/dL — ABNORMAL LOW (ref 3.5–5.0)
Anion gap: 14 (ref 5–15)
BUN: 27 mg/dL — ABNORMAL HIGH (ref 6–20)
CO2: 25 mmol/L (ref 22–32)
Calcium: 7.7 mg/dL — ABNORMAL LOW (ref 8.9–10.3)
Chloride: 92 mmol/L — ABNORMAL LOW (ref 98–111)
Creatinine, Ser: 3.9 mg/dL — ABNORMAL HIGH (ref 0.44–1.00)
GFR, Estimated: 13 mL/min — ABNORMAL LOW (ref >60)
Glucose, Bld: 132 mg/dL — ABNORMAL HIGH (ref 70–99)
Phosphorus: 2.3 mg/dL — ABNORMAL LOW (ref 2.5–4.6)
Potassium: 3.8 mmol/L (ref 3.5–5.1)
Sodium: 131 mmol/L — ABNORMAL LOW (ref 135–145)

## 2022-04-13 LAB — GLUCOSE, CAPILLARY
Glucose-Capillary: 138 mg/dL — ABNORMAL HIGH (ref 70–99)
Glucose-Capillary: 148 mg/dL — ABNORMAL HIGH (ref 70–99)
Glucose-Capillary: 139 mg/dL — ABNORMAL HIGH (ref 70–99)
Glucose-Capillary: 114 mg/dL — ABNORMAL HIGH (ref 70–99)

## 2022-04-13 LAB — HEPARIN LEVEL (UNFRACTIONATED): Heparin Unfractionated: 0.43 IU/mL (ref 0.30–0.70)

## 2022-04-13 MED ORDER — DARBEPOETIN ALFA 200 MCG/0.4ML IJ SOSY
200.0000 ug | PREFILLED_SYRINGE | INTRAMUSCULAR | Status: DC
  Filled 2022-04-13: qty 0.4

## 2022-04-13 MED ORDER — CHLORHEXIDINE GLUCONATE CLOTH 2 % EX PADS: 6.0000 | MEDICATED_PAD | Freq: Every day | CUTANEOUS | Status: DC

## 2022-04-13 MED ORDER — FENTANYL CITRATE PF 50 MCG/ML IJ SOSY
25.0000 ug | PREFILLED_SYRINGE | INTRAMUSCULAR | Status: DC | PRN
  Administered 2022-04-13 – 2022-04-15 (×5): 100 ug via INTRAVENOUS
  Administered 2022-04-15: 50 ug via INTRAVENOUS
  Administered 2022-04-15: 100 ug via INTRAVENOUS
  Filled 2022-04-13 (×6): qty 2
  Filled 2022-04-13: qty 1

## 2022-04-13 MED ORDER — SODIUM PHOSPHATES 45 MMOLE/15ML IV SOLN
30.0000 mmol | Freq: Once | INTRAVENOUS | Status: AC
  Administered 2022-04-13: 30 mmol via INTRAVENOUS
  Filled 2022-04-13: qty 10

## 2022-04-13 MED ORDER — PHENYTOIN 100 MG/4ML PO SUSP
125.0000 mg | Freq: Three times a day (TID) | ORAL | Status: DC
Start: 2022-04-13 — End: 2022-04-13
  Filled 2022-04-13: qty 5

## 2022-04-13 MED ORDER — POLYETHYLENE GLYCOL 3350 17 G PO PACK: 17.0000 g | PACK | Freq: Every day | ORAL | Status: DC

## 2022-04-13 MED ORDER — CALCIUM GLUCONATE-NACL 1-0.675 GM/50ML-% IV SOLN
1.0000 g | Freq: Once | INTRAVENOUS | Status: AC
  Administered 2022-04-13: 1000 mg via INTRAVENOUS
  Filled 2022-04-13: qty 50

## 2022-04-13 MED ORDER — DOCUSATE SODIUM 50 MG/5ML PO LIQD: 100.0000 mg | Freq: Two times a day (BID) | ORAL | Status: DC

## 2022-04-13 MED ORDER — PHENYTOIN 100 MG/4ML PO SUSP
125.0000 mg | Freq: Three times a day (TID) | ORAL | Status: DC
Start: 2022-04-13 — End: 2022-04-15
  Administered 2022-04-13 – 2022-04-15 (×7): 125 mg
  Filled 2022-04-13 (×10): qty 5

## 2022-04-13 NOTE — Progress Notes (Signed)
[facility] Progress Note  Subjective: Ca stable 7.7 today.   Vitals:   04/13/22 1517 04/13/22 1520 04/13/22 1600 04/13/22 1700  BP:   (!) 164/69 (!) 168/71  Pulse:   83 86  Resp:   (!) 25 (!) 29  Temp: 98.6 F (37 C)     TempSrc: Esophageal     SpO2:  100% 100% 100%  Weight:      Height:        Exam: Gen eyes open, no grip or UE/ LE movement On vent Sclera anicteric, throat w/ ETT No jvd or bruits Chest clear anterior/ lateral RRR no MRG Abd soft ntnd no mass or ascites +bs Ext no sig edema   LUE AVF +bruit  [facility] HD: [facility]  4h  65kg  2/3 bath  LUE AVF  Hep none - hectorol 4 u g tiw - venofer '100mg'$   - mircera 225 q 2, last 8/30    Summary: 56 y.o. female ESRD, h/o transplant, PTX, HTN, depression p/w AMS.  Rx only 1hr49mn of HD on 9/1 bec of lethargy and confusion  noted to be somnolent and initially thought that Ativan was contributing. Pt has been confused for 1-2 days; recently had a parathyroidecomy on 8/15 and was treated for hypocalcemia subsequently. Her regimen was supposed to be  liquid CaCO3 '2400mg'$  suspension 4x daily but according to the mother she did not tolerate this in the hospital last admission and it was confirmed with mother and daughter (lives w/ daughter) she was not taking any Ca at home.  Ca noted to be 5.5 in the ED with a pCO2 of 18. Patient had facial twitching in the ED with subsequent MRI of the brain showed gyriform diffusion restriction of the anterior right frontal lob suggestive of acute seizure/status epilepticus. EEG confirmed that it was coming from the right frontal lobe. Pt admitted, we were asked to see for ESRD.     CXR 9/6- gross cardiomegaly, extensive bilateral IS and consolidative airspace opacity with layering pleural effusions   Assessment/ Plan: SP cardiac arrest - on 9/4 AMS - MRI 9/07 showed signs of significant ABI VDRF - w/ bilat infiltrates by CXR and IS edema, persistent 9/6 Afib/ RVR - getting per tube  cardizem/ metoprolol, HR's 80-90s, NSR.  Volume - up 10kg at peak, w/ serial HD still up 4kg.  D510 was dc'd and IV seizure meds switched to po so wt gains should be dropping. Max UF w/ HD Monday.  ESRD - HD MWF. Had daily HD this week for vol overload. Next HD Monday.  Hypocalcemia/ hungry bone syndrome sp recent total parathyroidectomy 8/15. Pt dc'd on CaCO3 2400 elemental qid, but per the family she was not taking any calcium at home. Pt here now w/ seizures and hypocalcemia. Ca++ levels better now after IV boluses and is now back on high dose CaCO3 per NG.  - no high Ca++ bath available here, but will need at dc  - cont IV vdra (high-dose hectorol) 12 ug tiw IV - Ca 7.7 today today, stable - continue po CaCO3 at '3000mg'$  elemental tid between meals - no IV Ca gluc again today - will drop to once daily Ca level w/ daily RFP  HTN - sp Cleviprex gtt, getting cardizem and metoprolol po now, as above Seizures - f/b neurology, getting Keppra, Vimpat, dilantin and Onfi, all po now Anemia esrd -Hgb 8's, last op mircera 229m on 8/30, next esa due 9/13 have ordered darbe 200 ug  weekly on Wednesdays.   Secondary hyperparathyroidism - see as above > hypocalcemia post PTX. Binders are on hold post PTX and phos is up after repletion.  History of meningioma - status post resection 11/01/2020  Kelly Splinter 04/13/2022, 6:56 PM   Recent Labs  Lab 04/12/22 0150 04/12/22 1015 04/13/22 0215  HGB 8.9*  --  8.6*  ALBUMIN 1.9*  --  1.9*  CALCIUM 7.8*  --  7.7*  PHOS 1.5* 1.6* 2.3*  CREATININE 1.95*  --  3.90*  K 3.2*  --  3.8   No results for input(s): "IRON", "TIBC", "FERRITIN" in the last 168 hours. Inpatient medications:  acetaminophen  650 mg Per Tube Q4H   Or   acetaminophen (TYLENOL) oral liquid 160 mg/5 mL  650 mg Per Tube Q4H   Or   acetaminophen  650 mg Rectal Q4H   amantadine  100 mg Per Tube BID   bisacodyl  10 mg Rectal Q0600   calcium carbonate (dosed in mg elemental calcium)  3,000  mg of elemental calcium Per Tube TID BM   Chlorhexidine Gluconate Cloth  6 each Topical Q0600   cloBAZam  5 mg Per Tube Daily   [START ON 04-21-22] darbepoetin (ARANESP) injection - DIALYSIS  200 mcg Intravenous Q Wed-HD   diltiazem  60 mg Per Tube Q6H   docusate  100 mg Per Tube BID   doxercalciferol  12 mcg Intravenous Q M,W,F-HD   feeding supplement (PROSource TF20)  60 mL Per Tube Daily   lacosamide  150 mg Per Tube BID   levETIRAcetam  1,000 mg Per Tube Daily   metoprolol tartrate  25 mg Per Tube BID   mouth rinse  15 mL Mouth Rinse Q2H   pantoprazole  40 mg Per Tube Daily   phenytoin  125 mg Per Tube TID   polyethylene glycol  17 g Per Tube Daily   sodium chloride flush  10-40 mL Intracatheter Q12H    sodium chloride 10 mL/hr at 04/11/22 0010   feeding supplement (VITAL 1.5 CAL) 50 mL/hr at 04/13/22 1700   heparin 1,650 Units/hr (04/13/22 1700)   levETIRAcetam Stopped (04/10/22 1348)   sodium phosphate 30 mmol in dextrose 5 % 250 mL infusion 43 mL/hr at 04/13/22 1700   sodium chloride, albuterol, diclofenac Sodium, fentaNYL (SUBLIMAZE) injection, hydrALAZINE, lip balm, LORazepam, metoprolol tartrate, mouth rinse, sodium chloride flush

## 2022-04-13 NOTE — Progress Notes (Addendum)
NAME:  Ruth Gutierrez, MRN:  [id number], DOB:  11-17-1965, LOS: 8 ADMISSION DATE:  04/21/2022, CONSULTATION DATE:  04/07/2022 REFERRING MD:  Dr. Philipp Ovens, CHIEF COMPLAINT:  Acute Respiratory Failure, IHCA    History of Present Illness:  56 y/o female with history of ESRD s/p renal transplant on HD MWF, HTN, s/p parathyroidectomy, and depression. Admitted to [facility] on 9/1 with altered mental status from HD in the setting of seizures.  Hx of craniotomy in 10/2020 for tumor resection.  Developed increasing O2 needs, cardiac arrest on 9/4 in setting of aspiration after seizure. Tx to ICU on vent.  Critically ill due to acute hypoxic respiratory failure requiring mechanical ventilation.  Pertinent  Medical History  Anemia of Chronic Disease Arthritis  Deceased-Donor Kidney Transplant - [facility] ESRD on HD MWF GERD  Hyperparathyroidism  HTN  SOB  Wears Glasses  Significant Hospital Events: Including procedures, antibiotic start and stop dates in addition to other pertinent events   9/1 Admitted with AMS found to have seizures on EEG 9/3 NGT placed 9/4 Gradual increase in oxygen requirements, code blue at 1150, intubated and transferred to ICU. Went into afib with rvr during HD, started amio drip 9/5 Amiodarone stopped, metoprolol started, CT head unchanged no bleed, heparin gtt, remains on vent. After RR increased ABG showed hypoxia and alkalosis. Settings changed. Propofol weaned off, fentanyl added 9/6 Placed back on propofol for agitation and dyssynchrony on the vent overnight, showed cheyne stokes breathing pattern  9/7 Remains on vent and sedated, MRI for anoxic evaluation 9/8 Stable and minimally responsive, MRI showed diffuse hypoxic injury 9/9 D10 infusion stopped, tolerating TF  Interim History / Subjective:  Afebrile / WBC 15.4 On vent, no changes RN reports occasional myoclonic type movements with stimulation (suctioning etc) I/O - 1.3L positive in last 24 hours   Objective    Blood pressure (!) 156/72, pulse 91, temperature 98.8 F (37.1 C), temperature source Esophageal, resp. rate (!) 24, height '5\' 4"'$  (1.626 m), weight 69.9 kg, last menstrual period 06/02/2015, SpO2 100 %.    Vent Mode: PRVC FiO2 (%):  [40 %] 40 % Set Rate:  [18 bmp] 18 bmp Vt Set:  [430 mL] 430 mL PEEP:  [5 cmH20] 5 cmH20 Pressure Support:  [10 cmH20] 10 cmH20 Plateau Pressure:  [17 cmH20] 17 cmH20   Intake/Output Summary (Last 24 hours) at 04/13/2022 1009 Last data filed at 04/13/2022 0600 Gross per 24 hour  Intake 1453.09 ml  Output 500 ml  Net 953.09 ml   Filed Weights   04/10/22 0740 04/11/22 0234 04/11/22 2245  Weight: 71 kg 69.9 kg 69.9 kg    Examination: General: critically ill appearing adult female lying in bed on vent in NAD HEENT: MM pink/moist, ETT, anicteric, pupils 22m non-reactive Neuro: eyes open, does not follow commands, blinking frequently, unable to hold extremities up against gravity, +cough/gag with deep suctioning  CV: s1s2 RRR, no m/r/g PULM: non-labored at rest, lungs bilaterally with occasional rhonchi GI: soft, bsx4 active  Extremities: warm/dry, trace to 1+ dependent edema, LUE AVF with +thrill/bruit Skin: no rashes or lesions  Resolved Hospital Problem list     Assessment & Plan:   Acute Hypoxic Respiratory Failure in setting of Cardiac Arrest  Aspiration Event with Pneumonitis / ARDS Seizure, agonal breathing, and lost a pulse on 9/4 with ROSC after 19 minutes of CPR. Intubated and sedated. CXR with diffuse infiltrates. Showed Cheyne-Stokes breathing pattern 9/7 causing desynchrony, did well for a bit on pressure support, sedation  increased and back on PRVC. ABG stable without acidosis.  -PRVC with LTVV / lung protective measures  -VAP prevention measures  -wean PEEP / FiO2 for sats >90% -completed ceftriaxone, monitor off abx -follow intermittent CXR  -not a candidate for extubation given mental status   Status Epilepticus  Hx Craniotomy  10/3020 for Tumor Resection  Anoxic Brain Injury after Cardiac Arrest  Cortex impacted on MRI, diffuse injury.  No seiuzres on EEG x48 hours, cEEG stopped.  -appreciate Neurology assistance with patient care  -monitor off sedation  -AED's per Neurology  -follow clinical exam  -seizure precautions  -family requests no sedation > discontinue fentanyl infusion from [facility], add PRN dosing if needed for pain / discomfort   Hypocalcemia Post Parathyroidectomy  -Ca+ replacement 9/10 -monitor, replace as appropriate   ESRD on HD MWF AGMA - resolved  Hypokalemia, Hypophosphatemia  Hx of cadaver transplant in 2010, failed.  AFwRVR on HD during HD, prematurely ending HD.  -iHD per Nephrology  -Trend BMP / urinary output -Replace electrolytes as indicated, NaPhos 9/10  EKG Abnormalities ST elevations seen on telemetry. Unable to assess for symptoms, hemodynamically stable, troponin likely elevated post CPR. EKG showed possible PR depression and biphasic P waves in V1 and V2. Question of inverted T waves in II/III. Echo in 10/2020 showed moderate concentric LVH with EF of 60-65%, mild left atrial dilation, mild elevation in pulmonary artery pressure, moderate TR, and right atrial pressure of 3 mmHg. Echo showed EF 65%, mild concentric LVH, AV sclerosis without stenosis, and RAP 3 mmHg. -telemetry monitoring    AF w/ RVR Episode during HD on 9/4. Amiodarone gtt started and then stopped on 9/6. Stable for the past few days on BB. -continue cardizem, metoprolol  -heparin infusion  -brother in law requested SCD's, reviewed not indicated with full dose anticoagulation but will keep at their request    Hypertension  Amlodipine PTA. Intermittently requiring cleviprex infusion, beta blocker -continue cardizem, metoprolol  Hyperthyroidism TSH undetectable with elevated free T4 and normal T3 initially, now normal free T4 and low T3. This pattern likely represents subclinical hyperthyroidism with some  transient effects of amiodarone. As we could not do a RAIU scan due to recent amiodarone bolus and with no reported hyperthyroid symptoms prior to admission we elected not to treat. Trab and TSH ab negative.  -follow up TSH in 4-6 weeks    Hypoglycemia  Episode 9/6, 9/8 requiring D10 infusion  -continue TF  -follow glucose trend > 106-200 range in last 8 hours  Goals of Care  9/9 Mother at bedside, updated on plan of care. Indicated she does not want her to suffer.  She asked how she would know if she was making the right decision (implying she wouldn't want trach / long term support etc).  We reviewed that Zailee's brain has effectively been injured and the longer she goes without waking up and "being Jaala", the less likely it would be that she would ever recover.  She states Camber would not want to be less than whole.  She notes her other children are really struggling with Calleigh's situation.   Best Practice (right click and "Reselect all SmartList Selections" daily)  Diet/type: TF  DVT prophylaxis: systemic heparin GI prophylaxis: PPI Lines: Central line Foley:  N/A Code Status:  full code Last date of multidisciplinary goals of care discussion: 9/8 per Dr. Tamala Julian, 9/10 as above.   No family available 9/10 am.   Critical Care Time: 32 minutes  Noe Gens, MSN, APRN, NP-C, AGACNP-BC [facility] 04/13/2022, 10:09 AM   Please see [email or web address] for pager details.   From 7A-7P if no response, please call [phone] After hours, please call ELink [phone]

## 2022-04-13 NOTE — Progress Notes (Signed)
ANTICOAGULATION CONSULT NOTE- follow-up  Pharmacy Consult for Heparin Indication: atrial fibrillation  Allergies  Allergen Reactions   Penicillins Itching and Rash    Did it involve swelling of the face/tongue/throat, SOB, or low BP?Y Did it involve sudden or severe rash/hives, skin peeling, or any reaction on the inside of your mouth or nose? Y Did you need to seek medical attention at a hospital or doctor's office? Y When did it last happen?  2016     If all above answers are "NO", may proceed with cephalosporin use. 04/07/22> previously tolerated ceftriaxone, cefepime    Patient Measurements: Height: '5\' 4"'$  (162.6 cm) Weight: 69.9 kg (154 lb 1.6 oz) IBW/kg (Calculated) : 54.7 Heparin Dosing Weight: 70.5 kg  Vital Signs: Temp: 98.6 F (37 C) (09/10 1128) Temp Source: Esophageal (09/10 1128) BP: 166/72 (09/10 1300) Pulse Rate: 84 (09/10 1300)  Labs: Recent Labs    04/11/22 0406 04/12/22 0150 04/13/22 0215  HGB 8.6* 8.9* 8.6*  HCT 28.3* 28.4* 27.6*  PLT 159 167 215  HEPARINUNFRC 0.30 0.42 0.43  CREATININE 3.53* 1.95* 3.90*    Estimated Creatinine Clearance: 15.8 mL/min (A) (by C-G formula based on SCr of 3.9 mg/dL (H)).   Medical History: Past Medical History:  Diagnosis Date   Anemia of chronic disease    Arthritis    Deceased-donor kidney transplant    Performed at [facility], April 2010.  Initial ESRD due to HTN nephropathy   Eczema    ESRD (end stage renal disease) ([place])    M/W/F dialysis   FUO (fever of unknown origin) 05/17/2015   GERD (gastroesophageal reflux disease)    Headache(784.0)    History of hyperparathyroidism    Hypertension    Peritonitis ([place]) 10/2019   Shortness of breath    Wears glasses     Assessment: 56 yo female with history of ESRD - HD MWF, HTN, s/p parathyroidectomy, and depression. Renal transplant in 2010 failed. Recent surgery, parathyroidectomy, on 03/18/2022. Episode of afib with RVR during HD on 9/4. No anticoagulation  PTA. Pharmacy consulted for IV heparin. No evidence of bleeding on CT 9/5.   CBC stable, Hgb 9, PLT 170.  Per RN, no issues with infusion/line/no heparin holds and no s/sx of bleeding. Heparin level 0.43 (therapeutic)  Goal of Therapy:  Heparin level 0.3-0.7 units/ml Monitor platelets by anticoagulation protocol: Yes   Plan:  Continue heparin infusion 1650 units/hr Check daily heparin level Monitor daily CBC and s/sx of bleeding   Sloan Leiter, PharmD, BCPS, BCCCP Clinical Pharmacist Please refer to [facility] for [place] numbers 04/13/2022 1:09 PM

## 2022-04-14 ENCOUNTER — Inpatient Hospital Stay (HOSPITAL_COMMUNITY): Payer: Medicare Other

## 2022-04-14 DIAGNOSIS — G9341 Metabolic encephalopathy: Secondary | ICD-10-CM | POA: Diagnosis not present

## 2022-04-14 DIAGNOSIS — R4182 Altered mental status, unspecified: Secondary | ICD-10-CM | POA: Diagnosis not present

## 2022-04-14 LAB — COMPREHENSIVE METABOLIC PANEL
ALT: 19 U/L (ref 0–44)
AST: 18 U/L (ref 15–41)
Albumin: 1.7 g/dL — ABNORMAL LOW (ref 3.5–5.0)
Alkaline Phosphatase: 348 U/L — ABNORMAL HIGH (ref 38–126)
Anion gap: 18 — ABNORMAL HIGH (ref 5–15)
BUN: 47 mg/dL — ABNORMAL HIGH (ref 6–20)
CO2: 23 mmol/L (ref 22–32)
Calcium: 7.8 mg/dL — ABNORMAL LOW (ref 8.9–10.3)
Chloride: 90 mmol/L — ABNORMAL LOW (ref 98–111)
Creatinine, Ser: 5.18 mg/dL — ABNORMAL HIGH (ref 0.44–1.00)
GFR, Estimated: 9 mL/min — ABNORMAL LOW (ref >60)
Glucose, Bld: 163 mg/dL — ABNORMAL HIGH (ref 70–99)
Potassium: 3.9 mmol/L (ref 3.5–5.1)
Sodium: 131 mmol/L — ABNORMAL LOW (ref 135–145)
Total Bilirubin: 0.5 mg/dL (ref 0.3–1.2)
Total Protein: 6 g/dL — ABNORMAL LOW (ref 6.5–8.1)

## 2022-04-14 LAB — GLUCOSE, CAPILLARY
Glucose-Capillary: 149 mg/dL — ABNORMAL HIGH (ref 70–99)
Glucose-Capillary: 151 mg/dL — ABNORMAL HIGH (ref 70–99)
Glucose-Capillary: 152 mg/dL — ABNORMAL HIGH (ref 70–99)
Glucose-Capillary: 136 mg/dL — ABNORMAL HIGH (ref 70–99)
Glucose-Capillary: 114 mg/dL — ABNORMAL HIGH (ref 70–99)
Glucose-Capillary: 128 mg/dL — ABNORMAL HIGH (ref 70–99)
Glucose-Capillary: 108 mg/dL — ABNORMAL HIGH (ref 70–99)
Glucose-Capillary: 117 mg/dL — ABNORMAL HIGH (ref 70–99)

## 2022-04-14 LAB — CBC
HCT: 26.8 % — ABNORMAL LOW (ref 36.0–46.0)
Hemoglobin: 8.8 g/dL — ABNORMAL LOW (ref 12.0–15.0)
MCH: 29.7 pg (ref 26.0–34.0)
MCHC: 32.8 g/dL (ref 30.0–36.0)
MCV: 90.5 fL (ref 80.0–100.0)
Platelets: 218 10*3/uL (ref 150–400)
RBC: 2.96 MIL/uL — ABNORMAL LOW (ref 3.87–5.11)
RDW: 19.8 % — ABNORMAL HIGH (ref 11.5–15.5)
WBC: 19.1 10*3/uL — ABNORMAL HIGH (ref 4.0–10.5)
nRBC: 0.1 % (ref 0.0–0.2)

## 2022-04-14 LAB — CALCIUM, IONIZED: Calcium, Ionized, Serum: 4.2 mg/dL — ABNORMAL LOW (ref 4.5–5.6)

## 2022-04-14 LAB — HEPARIN LEVEL (UNFRACTIONATED): Heparin Unfractionated: 0.53 IU/mL (ref 0.30–0.70)

## 2022-04-14 MED ORDER — HEPARIN SODIUM (PORCINE) 1000 UNIT/ML IJ SOLN
INTRAMUSCULAR | Status: AC
  Filled 2022-04-14: qty 4

## 2022-04-14 MED ORDER — AMANTADINE HCL 50 MG/5ML PO SOLN: 100.0000 mg | ORAL | Status: DC

## 2022-04-14 NOTE — Progress Notes (Signed)
NAME:  Ruth Gutierrez, MRN:  [id number], DOB:  Jun 20, 1966, LOS: 9 ADMISSION DATE:  04/13/2022, CONSULTATION DATE:  04/07/2022 REFERRING MD:  Dr. Philipp Ovens, CHIEF COMPLAINT:  Acute Respiratory Failure, IHCA    History of Present Illness:  56 y/o female with history of ESRD s/p renal transplant on HD MWF, HTN, s/p parathyroidectomy, and depression. Admitted to ICU following cardiac arrest due to aspiration caused by seizure.  Now critically ill due to acute hypoxic respiratory failure requiring mechanical ventilation.  Pertinent  Medical History   Past Medical History:  Diagnosis Date   Anemia of chronic disease    Arthritis    Deceased-donor kidney transplant    Performed at [facility], April 2010.  Initial ESRD due to HTN nephropathy   Eczema    ESRD (end stage renal disease) ([place])    M/W/F dialysis   FUO (fever of unknown origin) 05/17/2015   GERD (gastroesophageal reflux disease)    Headache(784.0)    History of hyperparathyroidism    Hypertension    Peritonitis ([place]) 10/2019   Shortness of breath    Wears glasses      Significant Hospital Events: Including procedures, antibiotic start and stop dates in addition to other pertinent events   9/1 - admitted with AMS found to have seizures on EEG 9/3 - NG tube placed 9/4 - gradual increase in oxygen requirements, code blue at 1150, intubated and transferred to ICU. Went into afib with rvr during HD, started amio drip 9/5 - amiodarone stopped, metoprolol started, CT head unchanged no bleed, heparin gtt, remains on vent. After RR increased ABG showed hypoxia and alkalosis. Settings changed. Propofol weaned off, fentanyl added 9/6 - placed back on propofol for agitation and dyssynchrony on the vent overnight, showed cheyne stokes breathing pattern  9/7 - remains on vent and sedated, MRI for anoxic evaluation 9/8 - stable and minimally responsive, MRI showed diffuse hypoxic injury 9/9 - Goals of care discussion with available  family, discussed patient's poor prognosis with diffuse anoxic brain injury, plan to let family process and discuss patient's wishes before revisiting the discussion on 9/11  Interim History / Subjective:  No interval change, patient is unresponsive to verbal or painful stimuli. Her eyes are open but she does not track me or my hand in space. She also does not reflexively blink to objects pushed towards her eyes.   Objective   Blood pressure 111/72, pulse 83, temperature 98.5 F (36.9 C), temperature source Esophageal, resp. rate (!) 23, height '5\' 4"'$  (1.626 m), weight 75.6 kg, last menstrual period 06/02/2015, SpO2 100 %.    Vent Mode: PRVC FiO2 (%):  [40 %] 40 % Set Rate:  [18 bmp] 18 bmp Vt Set:  [430 mL] 430 mL PEEP:  [5 cmH20] 5 cmH20 Pressure Support:  [10 cmH20] 10 cmH20 Plateau Pressure:  [16 cmH20] 16 cmH20   Intake/Output Summary (Last 24 hours) at 04/14/2022 0731 Last data filed at 04/14/2022 0700 Gross per 24 hour  Intake 2073.99 ml  Output --  Net 2073.99 ml   Filed Weights   04/11/22 0234 04/11/22 2245 04/14/22 0331  Weight: 69.9 kg 69.9 kg 75.6 kg    Examination: General: Ill appearing female laying in bed, intubated HENT: [place]/AT, Pupils nonreactive Lungs: Ventilated breath sounds bilaterally, CTA Cardiovascular: RRR Abdomen: soft Extremities: Minimal LE edema Neuro: no gag reflex, no corneal reflex, not tracking objects in space, no response to painful stimuli, intermittent spontaneous coughing  Resolved Hospital Problem list  Assessment & Plan:  Acute Respiratory Failure ARDS 2/2 aspiration IHCA  Pt had a seizure, agonal breathing, and lost a pulse on 9/4 with ROSC after 19 minutes of CPR. Intubated and sedated. CXR with diffuse infiltrates. TTM orders in if needed, patient has been normothermic after short period of fevers on 9/4. Showed Cheyne-Stokes breathing pattern and MRI showed anoxic brain injury. Sedation is off and patient is currently largely  synchronous with the vent.  - Continue vent management off sedation, plan for extubation trial (convert to tracheostomy vs palliative, based on goals of care discussions) - 5 day course ceftriaxone completed 9/9   Status Epilepticus Anoxic Brain Injury Suspected bifrontal region origin. On phenytoin, keppra, and vimpat. Neurology following. No seizures on EEG for 48 hours plus, EEG d/c'd on 9/7. MRI showed diffuse anoxic brain injury. Very poor prognosis, will continue to work with neurology on management as we continue goals of care discussions with family. - medication per neurology   Hypocalcemia Post Parathyroidectomy Calcium of 5.5, ionized 0.62 on admission. Responded well to aggressive repletion. Corrected calcium 9.6 today. - Continue with repletion  - Will need high Ca++ bath for outpatient dialysis   Acid-Base Imbalance Gap of 20 on transfer with bicarb of 19. Delta delta originally showed pure AGMA with lactate of 6.9. ABG showed pH of 7.248 with elevated pCO2 and bicarb of 21, RR increased and as sedation was weaned patient was breathing over the vent. Repeat ABG pH of 7.568, low pO2, normal pCO2, and elevated bicarb, RR decreased, PEEP and FiO2 increased. ABG pH 7.494, low pO2, normal pCO2, and elevated bicarb. Pt on calcium carbonate. ABG pH 7.419, pCO2 44.2, pO2 76, bicarb 28.6. Stable for several days. Gap of 18 today. - likely 2/2 to uremia with BUN up to 47 from 27, plan for HD today    ESRD on HD MWF Renal transplant in 2010, failed. Episode of Afib with RVR during HD on 9/4, prematurely ended HD. - Continue HD per nephrology  Afib w/ RVR Episode during HD on 9/4. Amiodarone gtt started and then stopped on 9/6. Stable on BB. - continue metoprolol  - heparin gtt   EKG Abnormalities ST elevations seen on telemetry. Unable to assess for symptoms, hemodynamically stable, troponin likely elevated post CPR. EKG showed possible PR depression and biphasic P waves in V1 and V2.  Question of inverted T waves in II/III. Echo in 10/2020 showed moderate concentric LVH with EF of 60-65%, mild left atrial dilation, mild elevation in pulmonary artery pressure, moderate TR, and right atrial pressure of 3 mmHg. Echo showed EF 65%, mild concentric LVH, AV sclerosis without stenosis, and RAP 3 mmHg.   Hyperthyroidism TSH undetectable with elevated free T4 and normal T3 initially, now normal free T4 and low T3. This pattern likely represents subclinical hyperthyroidism with some transient effects of amiodarone. As we could not do a RAIU scan due to recent amiodarone bolus and with no reported hyperthyroid symptoms prior to admission we elected not to treat. Trab and TSH ab negative.    Hypertension Previously on amlodipine 10 mg daily. Has been on cleviprex gtt intermittently. BB as above. We will continue to monitor. - PRN hydralazine   Hypoglycemia Pt was hypoglycemic overnight 9/6 and d10 gtt was added, recurred overnight 9/8, D10 restarted. Off tube feeds still, will continue to monitor. Continue TF.   Advanced Care Planning Pt is critically ill with evidence of anoxic brain injury and minimal response. She has a very poor prognosis  and we will have repeat discussions with the family today about goals of care.   Best Practice (right click and "Reselect all SmartList Selections" daily)   Diet/type: tubefeeds DVT prophylaxis: systemic heparin GI prophylaxis: PPI Lines: N/A Foley:  Yes, and it is still needed Code Status:  full code Last date of multidisciplinary goals of care discussion [9/9 and plan for repeat discussion today, 9/11]  Labs   CBC: Recent Labs  Lab 04/10/22 0339 04/11/22 0406 04/12/22 0150 04/13/22 0215 04/14/22 0333  WBC 11.5* 11.4* 12.6* 15.4* 19.1*  HGB 9.0* 8.6* 8.9* 8.6* 8.8*  HCT 28.9* 28.3* 28.4* 27.6* 26.8*  MCV 93.2 95.3 92.2 92.0 90.5  PLT 170 159 167 215 161    Basic Metabolic Panel: Recent Labs  Lab 04/07/22 2010 04/08/22 0302  04/08/22 1458 04/08/22 1731 04/08/22 2015 04/09/22 0407 04/09/22 0808 04/10/22 0339 04/10/22 1406 04/11/22 0406 04/11/22 1400 04/12/22 0150 04/12/22 1015 04/13/22 0215 04/14/22 0333  NA  --  134*   < >  --   --  133*   < > 132*  --  131*  --  132*  --  131* 131*  K  --  4.8   < >  --   --  4.0   < > 3.9  --  3.8  --  3.2*  --  3.8 3.9  CL  --  93*  --   --   --  90*  --  91*  --  90*  --  92*  --  92* 90*  CO2  --  25  --   --   --  28  --  30  --  26  --  26  --  25 23  GLUCOSE  --  154*  --   --   --  138*  --  105*  --  113*  --  138*  --  132* 163*  BUN  --  36*  --   --   --  24*  --  18  --  23*  --  14  --  27* 47*  CREATININE  --  8.77*  --   --   --  4.97*  --  3.64*  --  3.53*  --  1.95*  --  3.90* 5.18*  CALCIUM  --  7.8*   < >  --    < > 6.9*   < > 7.5*  7.5*   < > 8.1* 6.9* 7.8*  --  7.7* 7.8*  MG 2.1 2.0  --  1.8  --  2.2  --   --   --   --   --   --   --   --   --   PHOS 3.7 3.9  --  2.6  --  3.4  3.5  --  3.7  --  3.4  --  1.5* 1.6* 2.3*  --    < > = values in this interval not displayed.   GFR: Estimated Creatinine Clearance: 12.4 mL/min (A) (by C-G formula based on SCr of 5.18 mg/dL (H)). Recent Labs  Lab 04/07/22 1527 04/07/22 2010 04/08/22 0838 04/09/22 0916 04/09/22 1054 04/10/22 0339 04/11/22 0406 04/12/22 0150 04/13/22 0215 04/14/22 0333  WBC  --   --    < >  --   --    < > 11.4* 12.6* 15.4* 19.1*  LATICACIDVEN 1.7 2.3*  --  0.7 1.2  --   --   --   --   --    < > =  values in this interval not displayed.    Liver Function Tests: Recent Labs  Lab 04/07/22 1246 04/08/22 0302 04/10/22 0339 04/11/22 0406 04/12/22 0150 04/13/22 0215 04/14/22 0333  AST 116*  --   --   --   --   --  18  ALT 90*  --   --   --   --   --  19  ALKPHOS 650*  --   --   --   --   --  348*  BILITOT 0.7  --   --   --   --   --  0.5  PROT 6.9  --   --   --   --   --  6.0*  ALBUMIN 2.6*   < > 2.4* 2.1* 1.9* 1.9* 1.7*   < > = values in this interval not displayed.    No results for input(s): "LIPASE", "AMYLASE" in the last 168 hours. No results for input(s): "AMMONIA" in the last 168 hours.  ABG    Component Value Date/Time   PHART 7.419 04/09/2022 1240   PCO2ART 44.2 04/09/2022 1240   PO2ART 76 (L) 04/09/2022 1240   HCO3 28.6 (H) 04/09/2022 1240   TCO2 30 04/09/2022 1240   ACIDBASEDEF 6.0 (H) 04/07/2022 1308   O2SAT 95 04/09/2022 1240     Coagulation Profile: No results for input(s): "INR", "PROTIME" in the last 168 hours.  Cardiac Enzymes: No results for input(s): "CKTOTAL", "CKMB", "CKMBINDEX", "TROPONINI" in the last 168 hours.  HbA1C: Hemoglobin A1C  Date/Time Value Ref Range Status  12/20/2020 10:01 AM 5.0 4.0 - 5.6 % Final    CBG: Recent Labs  Lab 04/13/22 1127 04/13/22 1516 04/13/22 2001 04/13/22 2320 04/14/22 0329  GLUCAP 139* 114* 149* 151* 152*    Review of Systems:   Unable to obtain.  Past Medical History:  She,  has a past medical history of Anemia of chronic disease, Arthritis, Deceased-donor kidney transplant, Eczema, ESRD (end stage renal disease) ([place]), FUO (fever of unknown origin) (05/17/2015), GERD (gastroesophageal reflux disease), Headache(784.0), History of hyperparathyroidism, Hypertension, Peritonitis ([place]) (10/2019), Shortness of breath, and Wears glasses.   Surgical History:   Past Surgical History:  Procedure Laterality Date   A/V FISTULAGRAM Left 02/12/2017   Procedure: A/V Fistulagram;  Surgeon: Algernon Huxley, MD;  Location: [place] [facility];  Service: Cardiovascular;  Laterality: Left;   A/V FISTULAGRAM Left 12/30/2017   Procedure: A/V FISTULAGRAM;  Surgeon: Algernon Huxley, MD;  Location: [place] [facility];  Service: Cardiovascular;  Laterality: Left;   A/V FISTULAGRAM Left 04/01/2021   Procedure: A/V FISTULAGRAM;  Surgeon: Algernon Huxley, MD;  Location: [place] [facility];  Service: Cardiovascular;  Laterality: Left;   A/V SHUNT INTERVENTION N/A 02/12/2017   Procedure: A/V Shunt  Intervention;  Surgeon: Algernon Huxley, MD;  Location: [place] [facility];  Service: Cardiovascular;  Laterality: N/A;   AV FISTULA PLACEMENT     BASCILIC VEIN TRANSPOSITION Left 10/30/2014   Procedure: LEFT [place];  Surgeon: Rosetta Posner, MD;  Location: [place];  Service: Vascular;  Laterality: Left;   [place] Left 01/03/2015   Procedure: LEFT ARM 2ND STAGE [place];  Surgeon: Rosetta Posner, MD;  Location: [place];  Service: Vascular;  Laterality: Left;   BIOPSY  10/23/2021   Procedure: BIOPSY;  Surgeon: Jackquline Denmark, MD;  Location: [facility];  Service: Gastroenterology;;   BREAST BIOPSY Left    Patient doesn't remember  any information from previous procedure.   CAPD REMOVAL N/A 10/31/2019   Procedure: PERITONEAL DIALYSIS  (CAPD) INFECTED CATHETER REMOVAL;  Surgeon: Coralie Keens, MD;  Location: [place];  Service: General;  Laterality: N/A;   CRANIOTOMY N/A 11/01/2020   Procedure: CRANIOTOMY FOR TUMOR EXCISION;  Surgeon: [name], MD;  Location: [place];  Service: Neurosurgery;  Laterality: N/A;   ESOPHAGOGASTRODUODENOSCOPY (EGD) WITH PROPOFOL N/A 10/23/2021   Procedure: ESOPHAGOGASTRODUODENOSCOPY (EGD) WITH PROPOFOL;  Surgeon: Jackquline Denmark, MD;  Location: [place];  Service: Gastroenterology;  Laterality: N/A;   FRACTURE SURGERY     left foot,baby toe nad next toe missing   INSERTION OF DIALYSIS CATHETER Right 10/30/2014   Procedure: INSERTION OF DIALYSIS CATHETER;  Surgeon: Rosetta Posner, MD;  Location: [place];  Service: Vascular;  Laterality: Right;   KIDNEY TRANSPLANT  11/02/2008   [facility])   PARATHYROIDECTOMY N/A 03/18/2022   Procedure: TOTAL PARATHYROIDECTOMY;  Surgeon: Armandina Gemma, MD;  Location: [place];  Service: General;  Laterality: N/A;   PLACEMENT OF LUMBAR DRAIN N/A 11/01/2020   Procedure: PLACEMENT OF LUMBAR DRAIN;  Surgeon: [name], MD;  Location: [place];  Service: Neurosurgery;   Laterality: N/A;   WISDOM TOOTH EXTRACTION       Social History:   reports that she has been smoking cigarettes. She has a 13.50 pack-year smoking history. She has never used smokeless tobacco. She reports that she does not drink alcohol and does not use drugs.   Family History:  Her family history includes Deep vein thrombosis in her brother; Diabetes in her brother; Hyperlipidemia in her sister; Hypertension in her father, mother, sister, and sister; Kidney disease in her brother. There is no history of Colon cancer, Esophageal cancer, Rectal cancer, or Breast cancer.   Allergies Allergies  Allergen Reactions   Penicillins Itching and Rash    Did it involve swelling of the face/tongue/throat, SOB, or low BP?Y Did it involve sudden or severe rash/hives, skin peeling, or any reaction on the inside of your mouth or nose? Y Did you need to seek medical attention at a hospital or doctor's office? Y When did it last happen?  2016     If all above answers are "NO", may proceed with cephalosporin use. 04/07/22> previously tolerated ceftriaxone, cefepime     Home Medications  Prior to Admission medications   Medication Sig Start Date End Date Taking? Authorizing Provider  acetaminophen (TYLENOL) 500 MG tablet Take 500 mg by mouth every 6 (six) hours as needed.   Yes [provider]  albuterol (VENTOLIN HFA) 108 (90 Base) MCG/ACT inhaler Inhale 3 puffs into the lungs daily as needed for wheezing or shortness of breath. 11/12/21 04/05/22 Yes Gaylan Gerold, DO  amLODipine (NORVASC) 10 MG tablet Take 1 tablet (10 mg total) by mouth daily. 07/02/21  Yes Lajean Manes, MD  cloNIDine (CATAPRES) 0.1 MG tablet Take 0.1 mg by mouth 2 (two) times daily. 04/01/22  Yes [provider]  diclofenac Sodium (VOLTAREN) 1 % GEL Apply 2 g topically 2 (two) times daily as needed (Right shoulder pain). Patient taking differently: Apply 1 Application topically daily as needed (Right shoulder pain). 01/20/22   Yes Gaylan Gerold, DO  diphenhydrAMINE (BENADRYL) 25 mg capsule Take 50 mg by mouth 3 (three) times daily as needed for allergies.   Yes [provider]  lidocaine-prilocaine (EMLA) cream Apply 1 application. topically every Monday, Wednesday, and Friday with hemodialysis. 02/01/20  Yes [provider]  sevelamer carbonate (  RENVELA) 800 MG tablet Take 1,600 mg by mouth 3 (three) times daily with meals.   Yes [provider]  calcitRIOL (ROCALTROL) 0.5 MCG capsule Take 4 capsules (2 mcg total) by mouth 2 (two) times daily between meals. Patient not taking: Reported on 04/05/2022 03/31/22   Sanjuan Dame, MD  Calcium Carbonate Antacid (CALCIUM CARBONATE, DOSED IN MG ELEMENTAL CALCIUM,) 1250 MG/5ML SUSP Take 24 mLs (2,400 mg of elemental calcium total) by mouth 4 (four) times daily. Patient not taking: Reported on 04/05/2022 03/31/22   Sanjuan Dame, MD  pantoprazole (PROTONIX) 40 MG tablet Take 1 tablet (40 mg total) by mouth 2 (two) times daily before a meal. Patient not taking: Reported on 03/19/2022 10/24/21 02/13/22  Rosezetta Schlatter, MD     Critical care time: 3

## 2022-04-14 NOTE — Progress Notes (Signed)
Patient transported to MRI & back to room 2M10 on the ventilator with no problems.

## 2022-04-14 NOTE — Progress Notes (Signed)
Palliative-   Chart reviewed including labs and imaging progress notes. Patient's mother daughter and sister are at bedside discussing patient's status with Dr. Hortense Ramal.  Patient's daughter also requesting to speak with nephrologist. I introduced palliative medicine.  Family would like to wait on further discussions until patient has repeat MRI which is being ordered and planned for today. Palliative medicine will follow-up after MRI either today or tomorrow.  Ruth Gutierrez, AGNP-C Palliative Medicine  No charge

## 2022-04-14 NOTE — Progress Notes (Addendum)
Subjective: No acute events overnight. Family at bedside  ROS: Unable to obtain due to poor mental status  Examination  Vital signs in last 24 hours: Temp:  [98.4 F (36.9 C)-99.8 F (37.7 C)] 98.5 F (36.9 C) (09/11 0400) Pulse Rate:  [77-102] 90 (09/11 1000) Resp:  [18-34] 30 (09/11 1000) BP: (111-183)/(65-88) 138/71 (09/11 1000) SpO2:  [96 %-100 %] 100 % (09/11 1000) FiO2 (%):  [30 %-40 %] 30 % (09/11 0855) Weight:  [75.6 kg] 75.6 kg (09/11 0331)  General: lying in bed, NAD RS: intubated Neuro: opens eyes and has roving eye movements but doesn't track examiner, doesn't follow commands, PERLA, corneal reflex intact, cough reflex intact, withdraws to noxious stimuli in all extremities  Basic Metabolic Panel: Recent Labs  Lab 04/07/22 2010 04/08/22 0302 04/08/22 1458 04/08/22 1731 04/08/22 2015 04/09/22 0407 04/09/22 0808 04/10/22 0339 04/10/22 1406 04/11/22 0406 04/11/22 1400 04/12/22 0150 04/12/22 1015 04/13/22 0215 04/14/22 0333  NA  --  134*   < >  --   --  133*   < > 132*  --  131*  --  132*  --  131* 131*  K  --  4.8   < >  --   --  4.0   < > 3.9  --  3.8  --  3.2*  --  3.8 3.9  CL  --  93*  --   --   --  90*  --  91*  --  90*  --  92*  --  92* 90*  CO2  --  25  --   --   --  28  --  30  --  26  --  26  --  25 23  GLUCOSE  --  154*  --   --   --  138*  --  105*  --  113*  --  138*  --  132* 163*  BUN  --  36*  --   --   --  24*  --  18  --  23*  --  14  --  27* 47*  CREATININE  --  8.77*  --   --   --  4.97*  --  3.64*  --  3.53*  --  1.95*  --  3.90* 5.18*  CALCIUM  --  7.8*   < >  --    < > 6.9*   < > 7.5*  7.5*   < > 8.1*   < > 7.8*  --  7.7* 7.8*  MG 2.1 2.0  --  1.8  --  2.2  --   --   --   --   --   --   --   --   --   PHOS 3.7 3.9  --  2.6  --  3.4  3.5  --  3.7  --  3.4  --  1.5* 1.6* 2.3*  --    < > = values in this interval not displayed.    CBC: Recent Labs  Lab 04/10/22 0339 04/11/22 0406 04/12/22 0150 04/13/22 0215 04/14/22 0333  WBC  11.5* 11.4* 12.6* 15.4* 19.1*  HGB 9.0* 8.6* 8.9* 8.6* 8.8*  HCT 28.9* 28.3* 28.4* 27.6* 26.8*  MCV 93.2 95.3 92.2 92.0 90.5  PLT 170 159 167 215 218     Coagulation Studies: No results for input(s): "LABPROT", "INR" in the last 72 hours.  Imaging No imaging overnight  ASSESSMENT AND PLAN: 23 old  female with status epilepticus arising from right frontal region in the setting of severe hypercalcemia and prior meningioma resection.  Had cardiac arrest on 04/07/2022 and MRI brain now showing anoxic brain injury.   Refractory electrographic/nonconvulsive status epilepticus, resolved Acute anoxic encephalopathy Anoxic brain injury Cardiac arrest - No definite seizures overnight   Recommendations - Continue current dose of Keppra, Vimpat, and Phenytoin '125mg'$  Q8h  - Hold onfi to minimize sedation - Continue amantadine 100 mg twice daily for neuro stimulation -Patient appears to be awake but minimally interactive, does not track family, does not follow any commands.  Family requested another MRI brain and if no improvement, most likely plan to transition to comfort care later today -Repeat MRI brain ordered -Management of rest of comorbidities per primary team -Discussed plan with critical care team and family at bedside  Addendum - Reviewed repeat MRI brain with family. Would like to wait few more days. Family ( mother) reiterated that patient would not want trach/peg. If no improvement in few days, then likely transfer to comfort care.    I have spent a total of  65 minutes with the patient reviewing hospital notes,  test results, labs and examining the patient as well as establishing an assessment and plan.  > 50% of time was spent in direct patient care.    Zeb Comfort Epilepsy [facility] For questions after 5pm please refer to AMION to reach the Neurologist on call

## 2022-04-14 NOTE — Progress Notes (Signed)
ANTICOAGULATION CONSULT NOTE- follow-up  Pharmacy Consult for Heparin Indication: atrial fibrillation  Allergies  Allergen Reactions   Penicillins Itching and Rash    Did it involve swelling of the face/tongue/throat, SOB, or low BP?Y Did it involve sudden or severe rash/hives, skin peeling, or any reaction on the inside of your mouth or nose? Y Did you need to seek medical attention at a hospital or doctor's office? Y When did it last happen?  2016     If all above answers are "NO", may proceed with cephalosporin use. 04/07/22> previously tolerated ceftriaxone, cefepime    Patient Measurements: Height: '5\' 4"'$  (162.6 cm) Weight: 75.6 kg (166 lb 10.7 oz) IBW/kg (Calculated) : 54.7 Heparin Dosing Weight: 70.5 kg  Vital Signs: Temp: 98.5 F (36.9 C) (09/11 0400) Temp Source: Esophageal (09/11 0400) BP: 121/65 (09/11 0800) Pulse Rate: 88 (09/11 0800)  Labs: Recent Labs    04/12/22 0150 04/13/22 0215 04/14/22 0333  HGB 8.9* 8.6* 8.8*  HCT 28.4* 27.6* 26.8*  PLT 167 215 218  HEPARINUNFRC 0.42 0.43 0.53  CREATININE 1.95* 3.90* 5.18*     Estimated Creatinine Clearance: 12.4 mL/min (A) (by C-G formula based on SCr of 5.18 mg/dL (H)).   Medical History: Past Medical History:  Diagnosis Date   Anemia of chronic disease    Arthritis    Deceased-donor kidney transplant    Performed at [facility], April 2010.  Initial ESRD due to HTN nephropathy   Eczema    ESRD (end stage renal disease) ([place])    M/W/F dialysis   FUO (fever of unknown origin) 05/17/2015   GERD (gastroesophageal reflux disease)    Headache(784.0)    History of hyperparathyroidism    Hypertension    Peritonitis ([place]) 10/2019   Shortness of breath    Wears glasses     Assessment: 56 yo female with history of ESRD - HD MWF, HTN, s/p parathyroidectomy, and depression. Renal transplant in 2010 failed. Recent surgery, parathyroidectomy, on 03/18/2022. Episode of afib with RVR during HD on 9/4. No  anticoagulation PTA. Pharmacy consulted for IV heparin. No evidence of bleeding on CT 9/5.   CBC stable, Hgb 8.8, PLT 218.  Per RN, no issues with infusion/line/no heparin holds and no s/sx of bleeding. Heparin level 0.53 (therapeutic)  Goal of Therapy:  Heparin level 0.3-0.7 units/ml Monitor platelets by anticoagulation protocol: Yes   Plan:  Continue heparin infusion 1650 units/hr Check daily heparin level Monitor daily CBC and s/sx of bleeding   Francena Hanly, PharmD Pharmacy Resident  04/14/2022 8:45 AM

## 2022-04-14 NOTE — Progress Notes (Addendum)
This chaplain responded to PMT NP-Kasie consult for spiritual care. The chaplain understands the Pt. is out of the room for a MRI.  This chaplain will attempt a re-visit.  **[phone] This chaplain phoned the unit. The chaplain understands family is not at the Pt. bedside. The chaplain will attempt prayer and support on Tuesday.  Chaplain Sallyanne Kuster [phone]

## 2022-04-14 NOTE — Progress Notes (Signed)
[facility] Progress Note  Subjective:  Ca stable 7.8 today.  SAlb 1.7 No change in neuro exam For [place] meeting today  Vitals:   04/14/22 0400 04/14/22 0700 04/14/22 0800 04/14/22 0855  BP: 125/71 111/72 121/65   Pulse: 79 83 88   Resp: (!) 26 (!) 23 18   Temp: 98.5 F (36.9 C)     TempSrc: Esophageal     SpO2: 100% 100% 100% 100%  Weight:      Height:        Exam: Gen eyes open, no grip or UE/ LE movement On vent Sclera anicteric, throat w/ ETT No jvd or bruits Chest clear anterior/ lateral RRR no MRG Abd soft ntnd no mass or ascites +bs Ext no sig edema   LUE AVF +bruit  OP HD: [facility] M[facility]  4h  65kg  2/3 bath  LUE AVF  Hep none - hectorol 4 u g tiw - venofer '100mg'$   - mircera 225 q 2, last 8/30    Assessment/ Plan: SP cardiac arrest - on 9/4 AMS - MRI 9/07 showed signs of significant ABI VDRF -  30% FIO2, #2 limits extubaiton Afib/ RVR - getting per tube cardizem/ metoprolol, HR's 80-90s, NSR.  Volume - Consistently up on bed weights.  Goal 4L UF w/ HD today.  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 Pt dc'd on CaCO3 2400 elemental qid, but per the family she was not taking any calcium at home. Pt here now w/ seizures and hypocalcemia. Ca++ levels better now after IV boluses and is now back on high dose CaCO3 per NG.  - no high Ca++ bath available here, but will need at dc  - cont IV vdra (high-dose hectorol) 12 ug tiw IV - Ca 7.8 today today, stable - continue po CaCO3 at '3000mg'$  elemental tid between meals  HTN - sp Cleviprex gtt, getting cardizem and metoprolol po now, as above Seizures - f/b neurology, getting Keppra, Vimpat, dilantin and Onfi, all po now Anemia esrd -Hgb 8's, last op mircera 261mg on 8/30, next esa due 9/13 have ordered darbe 200 ug weekly on Wednesdays.   Secondary hyperparathyroidism - see as above > hypocalcemia post PTX. Binders are on hold post PTX and phos is  up after repletion.  History of meningioma - status post resection 11/01/2020  RRexene Agent MD  04/14/2022, 9:43 AM   Recent Labs  Lab 04/12/22 1015 04/13/22 0215 04/14/22 0333  HGB  --  8.6* 8.8*  ALBUMIN  --  1.9* 1.7*  CALCIUM  --  7.7* 7.8*  PHOS 1.6* 2.3*  --   CREATININE  --  3.90* 5.18*  K  --  3.8 3.9    No results for input(s): "IRON", "TIBC", "FERRITIN" in the last 168 hours. Inpatient medications:  acetaminophen  650 mg Per Tube Q4H   Or   acetaminophen (TYLENOL) oral liquid 160 mg/5 mL  650 mg Per Tube Q4H   Or   acetaminophen  650 mg Rectal Q4H   amantadine  100 mg Per Tube BID   bisacodyl  10 mg Rectal Q0600   calcium carbonate (dosed in mg elemental calcium)  3,000 mg of elemental calcium Per Tube TID BM   Chlorhexidine Gluconate Cloth  6 each Topical Q0600   [START ON 910/09/2021 darbepoetin (ARANESP) injection - DIALYSIS  200 mcg Intravenous Q Wed-HD   diltiazem  60 mg Per Tube Q6H   docusate  100 mg Per Tube BID   doxercalciferol  12 mcg Intravenous Q M,W,F-HD   feeding supplement (PROSource TF20)  60 mL Per Tube Daily   lacosamide  150 mg Per Tube BID   levETIRAcetam  1,000 mg Per Tube Daily   metoprolol tartrate  25 mg Per Tube BID   mouth rinse  15 mL Mouth Rinse Q2H   pantoprazole  40 mg Per Tube Daily   phenytoin  125 mg Per Tube TID   polyethylene glycol  17 g Per Tube Daily   sodium chloride flush  10-40 mL Intracatheter Q12H    sodium chloride Stopped (04/13/22 1310)   feeding supplement (VITAL 1.5 CAL) 50 mL/hr at 04/14/22 0800   heparin 1,650 Units/hr (04/14/22 0800)   levETIRAcetam Stopped (04/10/22 1348)   sodium chloride, albuterol, diclofenac Sodium, fentaNYL (SUBLIMAZE) injection, hydrALAZINE, lip balm, metoprolol tartrate, mouth rinse, sodium chloride flush

## 2022-04-14 NOTE — Progress Notes (Signed)
Nutrition Follow-up  DOCUMENTATION CODES:   Not applicable  INTERVENTION:  Continue TF as ordered via OG: Vital 1.5 at 50 ml/hr Pro-Source TF20 60 mL daily This provides 101g of protein, 1880 kcals and 912 mL of free water   NUTRITION DIAGNOSIS:   Inadequate oral intake related to acute illness as evidenced by NPO status. - remains applicable  GOAL:   Patient will meet greater than or equal to 90% of their needs - being addressed with TF  MONITOR:   Vent status, TF tolerance, Labs, Weight trends  REASON FOR ASSESSMENT:   Consult Enteral/tube feeding initiation and management  ASSESSMENT:   56 yo female admitted with severe symptomatic  hypocalcemia with seizures. Pt with recent parathyroidectomy on 8/15, hungry bone syndrome post surgery. PMH includes HTN, ESRD sp transplant in 2010, now on iHD.  9/4 Code Blue, cardiac arrest due to aspiration caused by seizures, vomited several times, Intubated  Patient is currently intubated on ventilator support. No family at bedside at the time of assessment. Bone Gap discussions ongoing, family supposed to meet today to decide upon trajectory of care. TF infusing at goal, will continue current nutrition plan.  MV: 13.5 L/min Temp (24hrs), Avg:98.9 F (37.2 C), Min:98.4 F (36.9 C), Max:99.8 F (37.7 C)   Intake/Output Summary (Last 24 hours) at 04/14/2022 1705 Last data filed at 04/14/2022 1600 Gross per 24 hour  Intake 1698.08 ml  Output --  Net 1698.08 ml   Net IO Since Admission: 7,678.65 mL [04/14/22 1705]  Nutritionally Relevant Medications: Scheduled Meds:  bisacodyl  10 mg Rectal Q0600   calcium carbonate (dosed in mg elemental calcium)  3,000 mg of elemental calcium Per Tube TID BM   docusate  100 mg Per Tube BID   doxercalciferol  12 mcg Intravenous Q M,W,F-HD   feeding supplement (PROSource TF20)  60 mL Per Tube Daily   pantoprazole  40 mg Per Tube Daily   polyethylene glycol  17 g Per Tube Daily   Continuous  Infusions:  feeding supplement (VITAL 1.5 CAL) 50 mL/hr at 04/14/22 1000   Labs Reviewed: Na 131, chloride 90 BUN 63, creatinine 5.18   NUTRITION - FOCUSED PHYSICAL EXAM: Flowsheet Row Most Recent Value  Orbital Region No depletion  Upper Arm Region No depletion  Thoracic and Lumbar Region Mild depletion  Buccal Region No depletion  Temple Region No depletion  Clavicle Bone Region No depletion  Clavicle and Acromion Bone Region Mild depletion  Scapular Bone Region Unable to assess  Dorsal Hand No depletion  Patellar Region Mild depletion  Anterior Thigh Region Mild depletion  Posterior Calf Region Mild depletion  Edema (RD Assessment) None  Hair Reviewed  Eyes Reviewed  Mouth Reviewed  Skin Reviewed  Nails Reviewed  [broken]    Diet Order:   Diet Order             Diet NPO time specified  Diet effective now                   EDUCATION NEEDS:   Not appropriate for education at this time  Skin:  Skin Assessment: Reviewed RN Assessment  Last BM:  9/11 - type 7  Height:   Ht Readings from Last 1 Encounters:  04/14/22 '5\' 4"'$  (1.626 m)    Weight:   Wt Readings from Last 1 Encounters:  04/14/22 75.6 kg    Ideal Body Weight:  54.5 kg  BMI:  Body mass index is 28.61 kg/m.  Estimated Nutritional Needs:  Kcal:  1750-1950 kcals Protein:  95-125 g Fluid:  1000 mL plus UOP    Ranell Patrick, RD, LDN Clinical Dietitian RD pager # available in [facility]  After hours/weekend pager # available in [facility]

## 2022-04-15 ENCOUNTER — Inpatient Hospital Stay (HOSPITAL_COMMUNITY): Payer: Medicare Other

## 2022-04-15 DIAGNOSIS — N186 End stage renal disease: Secondary | ICD-10-CM

## 2022-04-15 DIAGNOSIS — G9341 Metabolic encephalopathy: Secondary | ICD-10-CM | POA: Diagnosis not present

## 2022-04-15 DIAGNOSIS — R4182 Altered mental status, unspecified: Secondary | ICD-10-CM | POA: Diagnosis not present

## 2022-04-15 DIAGNOSIS — E892 Postprocedural hypoparathyroidism: Secondary | ICD-10-CM

## 2022-04-15 DIAGNOSIS — Z7189 Other specified counseling: Secondary | ICD-10-CM | POA: Diagnosis not present

## 2022-04-15 DIAGNOSIS — Z992 Dependence on renal dialysis: Secondary | ICD-10-CM

## 2022-04-15 LAB — COMPREHENSIVE METABOLIC PANEL
ALT: 20 U/L (ref 0–44)
AST: 31 U/L (ref 15–41)
Albumin: 2 g/dL — ABNORMAL LOW (ref 3.5–5.0)
Alkaline Phosphatase: 361 U/L — ABNORMAL HIGH (ref 38–126)
Anion gap: 14 (ref 5–15)
BUN: 28 mg/dL — ABNORMAL HIGH (ref 6–20)
CO2: 26 mmol/L (ref 22–32)
Calcium: 7.8 mg/dL — ABNORMAL LOW (ref 8.9–10.3)
Chloride: 92 mmol/L — ABNORMAL LOW (ref 98–111)
Creatinine, Ser: 3.36 mg/dL — ABNORMAL HIGH (ref 0.44–1.00)
GFR, Estimated: 16 mL/min — ABNORMAL LOW (ref >60)
Glucose, Bld: 140 mg/dL — ABNORMAL HIGH (ref 70–99)
Potassium: 4.3 mmol/L (ref 3.5–5.1)
Sodium: 132 mmol/L — ABNORMAL LOW (ref 135–145)
Total Bilirubin: 0.6 mg/dL (ref 0.3–1.2)
Total Protein: 6.9 g/dL (ref 6.5–8.1)

## 2022-04-15 LAB — POCT I-STAT 7, (LYTES, BLD GAS, ICA,H+H)
Acid-Base Excess: 5 mmol/L — ABNORMAL HIGH (ref 0.0–2.0)
Bicarbonate: 27.6 mmol/L (ref 20.0–28.0)
Calcium, Ion: 0.97 mmol/L — ABNORMAL LOW (ref 1.15–1.40)
HCT: 29 % — ABNORMAL LOW (ref 36.0–46.0)
Hemoglobin: 9.9 g/dL — ABNORMAL LOW (ref 12.0–15.0)
O2 Saturation: 98 %
Patient temperature: 98.4
Potassium: 4.2 mmol/L (ref 3.5–5.1)
Sodium: 132 mmol/L — ABNORMAL LOW (ref 135–145)
TCO2: 29 mmol/L (ref 22–32)
pCO2 arterial: 30.6 mmHg — ABNORMAL LOW (ref 32–48)
pH, Arterial: 7.562 — ABNORMAL HIGH (ref 7.35–7.45)
pO2, Arterial: 88 mmHg (ref 83–108)

## 2022-04-15 LAB — CBC
HCT: 27.3 % — ABNORMAL LOW (ref 36.0–46.0)
Hemoglobin: 8.7 g/dL — ABNORMAL LOW (ref 12.0–15.0)
MCH: 29 pg (ref 26.0–34.0)
MCHC: 31.9 g/dL (ref 30.0–36.0)
MCV: 91 fL (ref 80.0–100.0)
Platelets: 269 10*3/uL (ref 150–400)
RBC: 3 MIL/uL — ABNORMAL LOW (ref 3.87–5.11)
RDW: 20.2 % — ABNORMAL HIGH (ref 11.5–15.5)
WBC: 25.2 10*3/uL — ABNORMAL HIGH (ref 4.0–10.5)
nRBC: 0.2 % (ref 0.0–0.2)

## 2022-04-15 LAB — GLUCOSE, CAPILLARY
Glucose-Capillary: 118 mg/dL — ABNORMAL HIGH (ref 70–99)
Glucose-Capillary: 128 mg/dL — ABNORMAL HIGH (ref 70–99)
Glucose-Capillary: 104 mg/dL — ABNORMAL HIGH (ref 70–99)
Glucose-Capillary: 125 mg/dL — ABNORMAL HIGH (ref 70–99)
Glucose-Capillary: 132 mg/dL — ABNORMAL HIGH (ref 70–99)

## 2022-04-15 LAB — PHOSPHORUS: Phosphorus: 2.1 mg/dL — ABNORMAL LOW (ref 2.5–4.6)

## 2022-04-15 LAB — HEPARIN LEVEL (UNFRACTIONATED): Heparin Unfractionated: 0.55 IU/mL (ref 0.30–0.70)

## 2022-04-15 LAB — PHENYTOIN LEVEL, TOTAL: Phenytoin Lvl: 5.5 ug/mL — ABNORMAL LOW (ref 10.0–20.0)

## 2022-04-15 LAB — MAGNESIUM: Magnesium: 2.2 mg/dL (ref 1.7–2.4)

## 2022-04-15 MED ORDER — PHENYTOIN 125 MG/5ML PO SUSP
150.0000 mg | Freq: Three times a day (TID) | ORAL | Status: DC
  Administered 2022-04-15 – 2022-04-16 (×3): 150 mg
  Filled 2022-04-15 (×7): qty 8

## 2022-04-15 MED ORDER — MIDAZOLAM HCL 2 MG/2ML IJ SOLN
4.0000 mg | Freq: Once | INTRAMUSCULAR | Status: AC
  Administered 2022-04-15: 4 mg via INTRAVENOUS
  Filled 2022-04-15: qty 4

## 2022-04-15 MED ORDER — SODIUM CHLORIDE 0.9 % IV SOLN: 100.0000 mg | INTRAVENOUS | Status: DC

## 2022-04-15 MED ORDER — LEVETIRACETAM 500 MG PO TABS
500.0000 mg | ORAL_TABLET | ORAL | Status: DC
  Filled 2022-04-15: qty 1

## 2022-04-15 MED ORDER — PROPOFOL 1000 MG/100ML IV EMUL
5.0000 ug/kg/min | INTRAVENOUS | Status: DC
  Administered 2022-04-15: 40 ug/kg/min via INTRAVENOUS
  Administered 2022-04-15: 10 ug/kg/min via INTRAVENOUS
  Administered 2022-04-16: 40 ug/kg/min via INTRAVENOUS
  Filled 2022-04-15 (×3): qty 100

## 2022-04-15 MED ORDER — BISACODYL 10 MG RE SUPP: 10.0000 mg | Freq: Every day | RECTAL | Status: DC | PRN

## 2022-04-15 MED ORDER — DOCUSATE SODIUM 50 MG/5ML PO LIQD: 100.0000 mg | Freq: Two times a day (BID) | ORAL | Status: DC | PRN

## 2022-04-15 MED ORDER — AMANTADINE HCL 50 MG/5ML PO SOLN: 200.0000 mg | ORAL | Status: DC

## 2022-04-15 MED ORDER — MEPERIDINE HCL 25 MG/ML IJ SOLN: 25.0000 mg | Freq: Once | INTRAMUSCULAR | Status: DC

## 2022-04-15 MED ORDER — LEVETIRACETAM IN NACL 500 MG/100ML IV SOLN: 500.0000 mg | INTRAVENOUS | Status: DC

## 2022-04-15 MED ORDER — SODIUM CHLORIDE 0.9 % IV SOLN
150.0000 mg | Freq: Once | INTRAVENOUS | Status: AC
  Administered 2022-04-15: 150 mg via INTRAVENOUS
  Filled 2022-04-15: qty 15

## 2022-04-15 MED ORDER — LORAZEPAM 2 MG/ML IJ SOLN
4.0000 mg | Freq: Once | INTRAMUSCULAR | Status: AC
  Administered 2022-04-15: 4 mg via INTRAVENOUS

## 2022-04-15 MED ORDER — PROPOFOL 1000 MG/100ML IV EMUL
INTRAVENOUS | Status: AC
  Filled 2022-04-15: qty 100

## 2022-04-15 MED ORDER — LEVETIRACETAM IN NACL 1000 MG/100ML IV SOLN
1000.0000 mg | Freq: Once | INTRAVENOUS | Status: AC
  Administered 2022-04-15: 1000 mg via INTRAVENOUS
  Filled 2022-04-15: qty 100

## 2022-04-15 MED ORDER — LORAZEPAM 2 MG/ML IJ SOLN
INTRAMUSCULAR | Status: AC
  Filled 2022-04-15: qty 2

## 2022-04-15 MED ORDER — LORAZEPAM 2 MG/ML IJ SOLN
4.0000 mg | Freq: Once | INTRAMUSCULAR | Status: AC
  Administered 2022-04-15: 4 mg via INTRAVENOUS
  Filled 2022-04-15: qty 2

## 2022-04-15 MED ORDER — "THROMBI-PAD 3""X3"" EX PADS"
1.0000 | MEDICATED_PAD | Freq: Once | CUTANEOUS | Status: AC
  Administered 2022-04-15: 1 via TOPICAL
  Filled 2022-04-15: qty 1

## 2022-04-15 MED ORDER — LACOSAMIDE 50 MG PO TABS: 100.0000 mg | ORAL_TABLET | ORAL | Status: DC

## 2022-04-15 MED ORDER — CLONAZEPAM 0.5 MG PO TBDP
0.5000 mg | ORAL_TABLET | Freq: Two times a day (BID) | ORAL | Status: DC
  Administered 2022-04-15 – 2022-04-16 (×2): 0.5 mg
  Filled 2022-04-15 (×2): qty 1

## 2022-04-15 MED ORDER — POLYETHYLENE GLYCOL 3350 17 G PO PACK: 17.0000 g | PACK | Freq: Every day | ORAL | Status: DC | PRN

## 2022-04-15 MED ORDER — K PHOS MONO-SOD PHOS DI & MONO 155-852-130 MG PO TABS
250.0000 mg | ORAL_TABLET | Freq: Three times a day (TID) | ORAL | Status: AC
  Administered 2022-04-15 (×2): 250 mg
  Filled 2022-04-15 (×2): qty 1

## 2022-04-15 NOTE — Progress Notes (Signed)
NAME:  Ruth Gutierrez, MRN:  [id number], DOB:  1966/05/15, LOS: 47 ADMISSION DATE:  04/08/2022, CONSULTATION DATE:  04/07/2022 REFERRING MD:  Dr. Philipp Ovens, CHIEF COMPLAINT:  Acute Respiratory Failure, IHCA    History of Present Illness:  56 y/o female with history of ESRD s/p renal transplant on HD MWF, HTN, s/p parathyroidectomy, and depression. Admitted to ICU following cardiac arrest due to aspiration caused by seizure.  Now critically ill due to acute hypoxic respiratory failure requiring mechanical ventilation.  Pertinent  Medical History   Past Medical History:  Diagnosis Date   Anemia of chronic disease    Arthritis    Deceased-donor kidney transplant    Performed at [facility], April 2010.  Initial ESRD due to HTN nephropathy   Eczema    ESRD (end stage renal disease) ([place])    M/W/F dialysis   FUO (fever of unknown origin) 05/17/2015   GERD (gastroesophageal reflux disease)    Headache(784.0)    History of hyperparathyroidism    Hypertension    Peritonitis ([place]) 10/2019   Shortness of breath    Wears glasses      Significant Hospital Events: Including procedures, antibiotic start and stop dates in addition to other pertinent events   9/1 - admitted with AMS found to have seizures on EEG 9/3 - NG tube placed 9/4 - gradual increase in oxygen requirements, code blue at 1150, intubated and transferred to ICU. Went into afib with rvr during HD, started amio drip 9/5 - amiodarone stopped, metoprolol started, CT head unchanged no bleed, heparin gtt, remains on vent. After RR increased ABG showed hypoxia and alkalosis. Settings changed. Propofol weaned off, fentanyl added 9/6 - placed back on propofol for agitation and dyssynchrony on the vent overnight, showed cheyne stokes breathing pattern  9/7 - remains on vent and sedated, MRI for anoxic evaluation 9/8 - stable and minimally responsive, MRI showed diffuse hypoxic injury 9/9 - Goals of care discussion with available  family, discussed patient's poor prognosis with diffuse anoxic brain injury, plan to let family process and discuss patient's wishes before revisiting the discussion on 9/11 9/11 - Repeat MRI head with stable anoxic brain injury. Patient opens her eyes but is not tracking or responding to stimuli. Further goals of care discussions result with family wanting to watch her for a few more days.  9/12 - Seizure like activity overnight and in the AM aborted with ativan, EEG started. Episode of vomiting when head was manipulated for EEG leads.   Interim History / Subjective:  Patient is tachypnic and tachycardic with frequent coughing and tonic shoulder movement. No EEG connected. Pt had a similar episode last night with LUE tonic extension and left gaze deviation terminated with ativan.   Objective   Blood pressure 128/84, pulse (!) 117, temperature 98.4 F (36.9 C), temperature source Oral, resp. rate (!) 40, height '5\' 4"'$  (1.626 m), weight 71.6 kg, last menstrual period 06/02/2015, SpO2 100 %.    Vent Mode: PRVC FiO2 (%):  [30 %] 30 % Set Rate:  [18 bmp] 18 bmp Vt Set:  [430 mL-440 mL] 440 mL PEEP:  [5 cmH20] 5 cmH20 Plateau Pressure:  [13 cmH20-24 cmH20] 24 cmH20   Intake/Output Summary (Last 24 hours) at 04/15/2022 0936 Last data filed at 04/15/2022 0800 Gross per 24 hour  Intake 1341.46 ml  Output 404 ml  Net 937.46 ml   Filed Weights   04/14/22 0331 04/15/22 0142 04/15/22 0615  Weight: 75.6 kg 75.6 kg 71.6 kg  Examination: General: Ill appearing female laying in bed, intubated HENT: [place]/AT, Left pupil reactive but sluggish, right pupil not reactive Lungs: Ventilated breath sounds bilaterally, CTA Cardiovascular: tachycardic  Abdomen: soft Extremities: Minimal LE edema, no resistance to passive ROM testing in the upper extremities Neuro:  corneal reflex intact, eyes open, not tracking objects in space, no response to painful stimuli, frequent coughing  Resolved Hospital Problem  list     Assessment & Plan:  Acute Respiratory Failure ARDS 2/2 aspiration IHCA  Pt had a seizure, agonal breathing, and lost a pulse on 9/4 with ROSC after 19 minutes of CPR. Intubated and sedated. CXR with diffuse infiltrates. TTM orders in if needed, patient has been normothermic after short period of fevers on 9/4. Showed Cheyne-Stokes breathing pattern and MRI showed anoxic brain injury. Sedation is off and patient is frequently breathing over the vent with RR in the 30-40 range. - Continue vent management off sedation, plan for extubation trial (convert to tracheostomy vs palliative, based on goals of care discussions) - 5 day course ceftriaxone completed 9/9   Status Epilepticus Anoxic Brain Injury Suspected bifrontal region origin. Has been stable phenytoin, keppra, and vimpat. Neurology following. No seizures on EEG for 48 hours plus, EEG d/c'd on 9/7. MRI showed diffuse anoxic brain injury, repeat was largely unchanged. Seizure like activity on 9/12 aborted with ativan, likely 2/2 to increased clearance of medication in HD, post HD medication additions. Amantadine was started on 9/8 at 200 mg daily, HD dosing is 200 mg weekly, this may be causing some of the increased activity after getting 500 mg over 3 days. No livedo reticularis or muscle rigidity in the extremities.  With continued unresponsive state and evidence of diffuse anoxic brain injury she has a very poor prognosis. We will continue to work with neurology on management as we continue goals of care discussions with family. - medication per neurology - EEG   Hypocalcemia Post Parathyroidectomy Calcium of 5.5, ionized 0.62 on admission. Responded well to aggressive repletion. Corrected calcium 9.4 today. - Continue with repletion  - Will need high Ca++ bath for outpatient dialysis  Leukocytosis WBC increased from 15.4 to 25.2 over 2 days, no fevers, however there is standing tylenol from TTM orders. Unknown source of  infection at this point. May be due to seizures as above.  - D/c scheduled tylenol, monitor for fever - EEG, continue seizure meds per neuro - If fever- culture and start broad spec abx  Acid-Base Imbalance Gap of 20 on transfer with bicarb of 19. Delta delta originally showed pure AGMA with lactate of 6.9. ABG showed pH of 7.248 with elevated pCO2 and bicarb of 21, RR increased and as sedation was weaned patient was breathing over the vent. Repeat ABG pH of 7.568, low pO2, normal pCO2, and elevated bicarb, RR decreased, PEEP and FiO2 increased. ABG pH 7.494, low pO2, normal pCO2, and elevated bicarb. Pt on calcium carbonate. ABG pH 7.419, pCO2 44.2, pO2 76, bicarb 28.6. Stable for several days. Gap of 14 today.    ESRD on HD MWF Renal transplant in 2010, failed. Episode of Afib with RVR during HD on 9/4, prematurely ended HD. - Continue HD per nephrology   Afib w/ RVR Episode during HD on 9/4. Amiodarone gtt started and then stopped on 9/6. Stable on BB. - continue metoprolol  - heparin gtt   Hypertension Previously on amlodipine 10 mg daily. Has been on cleviprex gtt intermittently. BB as above. We will continue to monitor. -  PRN hydralazine  Hypoglycemia Pt was hypoglycemic overnight 9/6 and d10 gtt was added, recurred overnight 9/8, D10 restarted. Off tube feeds still, will continue to monitor. Continue TF.  EKG Abnormalities ST elevations seen on telemetry. Unable to assess for symptoms, hemodynamically stable, troponin likely elevated post CPR. EKG showed possible PR depression and biphasic P waves in V1 and V2. Question of inverted T waves in II/III. Echo in 10/2020 showed moderate concentric LVH with EF of 60-65%, mild left atrial dilation, mild elevation in pulmonary artery pressure, moderate TR, and right atrial pressure of 3 mmHg. Echo showed EF 65%, mild concentric LVH, AV sclerosis without stenosis, and RAP 3 mmHg.   Hyperthyroidism TSH undetectable with elevated free T4 and  normal T3 initially, now normal free T4 and low T3. This pattern likely represents subclinical hyperthyroidism with some transient effects of amiodarone. As we could not do a RAIU scan due to recent amiodarone bolus and with no reported hyperthyroid symptoms prior to admission we elected not to treat. Trab and TSH ab negative.    Advanced Care Planning Pt is critically ill with evidence of anoxic brain injury and minimal response. She has a very poor prognosis and we will have repeat discussions with the family today about goals of care. Will continue full scope of care.  Best Practice (right click and "Reselect all SmartList Selections" daily)   Diet/type: tubefeeds DVT prophylaxis: prophylactic heparin  GI prophylaxis: PPI Lines: Central line Foley:  Yes, and it is still needed Code Status:  full code Last date of multidisciplinary goals of care discussion [9/11]  Labs   CBC: Recent Labs  Lab 04/11/22 0406 04/12/22 0150 04/13/22 0215 04/14/22 0333 04/15/22 0815 04/15/22 0817  WBC 11.4* 12.6* 15.4* 19.1*  --  25.2*  HGB 8.6* 8.9* 8.6* 8.8* 9.9* 8.7*  HCT 28.3* 28.4* 27.6* 26.8* 29.0* 27.3*  MCV 95.3 92.2 92.0 90.5  --  91.0  PLT 159 167 215 218  --  161    Basic Metabolic Panel: Recent Labs  Lab 04/08/22 1731 04/08/22 2015 04/09/22 0407 04/09/22 0808 04/11/22 0406 04/11/22 1400 04/12/22 0150 04/12/22 1015 04/13/22 0215 04/14/22 0333 04/15/22 0815 04/15/22 0817  NA  --   --  133*   < > 131*  --  132*  --  131* 131* 132* 132*  K  --   --  4.0   < > 3.8  --  3.2*  --  3.8 3.9 4.2 4.3  CL  --   --  90*   < > 90*  --  92*  --  92* 90*  --  92*  CO2  --   --  28   < > 26  --  26  --  25 23  --  26  GLUCOSE  --   --  138*   < > 113*  --  138*  --  132* 163*  --  140*  BUN  --   --  24*   < > 23*  --  14  --  27* 47*  --  28*  CREATININE  --   --  4.97*   < > 3.53*  --  1.95*  --  3.90* 5.18*  --  3.36*  CALCIUM  --    < > 6.9*   < > 8.1* 6.9* 7.8*  --  7.7* 7.8*  --   7.8*  MG 1.8  --  2.2  --   --   --   --   --   --   --   --  2.2  PHOS 2.6  --  3.4  3.5   < > 3.4  --  1.5* 1.6* 2.3*  --   --  2.1*   < > = values in this interval not displayed.   GFR: Estimated Creatinine Clearance: 18.6 mL/min (A) (by C-G formula based on SCr of 3.36 mg/dL (H)). Recent Labs  Lab 04/09/22 0916 04/09/22 1054 04/10/22 0339 04/12/22 0150 04/13/22 0215 04/14/22 0333 04/15/22 0817  WBC  --   --    < > 12.6* 15.4* 19.1* 25.2*  LATICACIDVEN 0.7 1.2  --   --   --   --   --    < > = values in this interval not displayed.    Liver Function Tests: Recent Labs  Lab 04/11/22 0406 04/12/22 0150 04/13/22 0215 04/14/22 0333 04/15/22 0817  AST  --   --   --  18 31  ALT  --   --   --  19 20  ALKPHOS  --   --   --  348* 361*  BILITOT  --   --   --  0.5 0.6  PROT  --   --   --  6.0* 6.9  ALBUMIN 2.1* 1.9* 1.9* 1.7* 2.0*   No results for input(s): "LIPASE", "AMYLASE" in the last 168 hours. No results for input(s): "AMMONIA" in the last 168 hours.  ABG    Component Value Date/Time   PHART 7.562 (H) 04/15/2022 0815   PCO2ART 30.6 (L) 04/15/2022 0815   PO2ART 88 04/15/2022 0815   HCO3 27.6 04/15/2022 0815   TCO2 29 04/15/2022 0815   ACIDBASEDEF 6.0 (H) 04/07/2022 1308   O2SAT 98 04/15/2022 0815     Coagulation Profile: No results for input(s): "INR", "PROTIME" in the last 168 hours.  Cardiac Enzymes: No results for input(s): "CKTOTAL", "CKMB", "CKMBINDEX", "TROPONINI" in the last 168 hours.  HbA1C: Hemoglobin A1C  Date/Time Value Ref Range Status  12/20/2020 10:01 AM 5.0 4.0 - 5.6 % Final    CBG: Recent Labs  Lab 04/14/22 1540 04/14/22 2056 04/14/22 2316 04/15/22 0408 04/15/22 0733  GLUCAP 128* 108* 117* 118* 128*    Review of Systems:   Unable to obtain.  Past Medical History:  She,  has a past medical history of Anemia of chronic disease, Arthritis, Deceased-donor kidney transplant, Eczema, ESRD (end stage renal disease) ([place]), FUO (fever  of unknown origin) (05/17/2015), GERD (gastroesophageal reflux disease), Headache(784.0), History of hyperparathyroidism, Hypertension, Peritonitis ([place]) (10/2019), Shortness of breath, and Wears glasses.   Surgical History:   Past Surgical History:  Procedure Laterality Date   A/V FISTULAGRAM Left 02/12/2017   Procedure: A/V Fistulagram;  Surgeon: Algernon Huxley, MD;  Location: [place] [facility];  Service: Cardiovascular;  Laterality: Left;   A/V FISTULAGRAM Left 12/30/2017   Procedure: A/V FISTULAGRAM;  Surgeon: Algernon Huxley, MD;  Location: [place] [facility];  Service: Cardiovascular;  Laterality: Left;   A/V FISTULAGRAM Left 04/01/2021   Procedure: A/V FISTULAGRAM;  Surgeon: Algernon Huxley, MD;  Location: [place] [facility];  Service: Cardiovascular;  Laterality: Left;   A/V SHUNT INTERVENTION N/A 02/12/2017   Procedure: A/V Shunt Intervention;  Surgeon: Algernon Huxley, MD;  Location: [place] [facility];  Service: Cardiovascular;  Laterality: N/A;   AV FISTULA PLACEMENT     BASCILIC VEIN TRANSPOSITION Left 10/30/2014   Procedure: LEFT [employer];  Surgeon: Rosetta Posner, MD;  Location: [place];  Service: Vascular;  Laterality: Left;  BASCILIC VEIN TRANSPOSITION Left 01/03/2015   Procedure: LEFT ARM 2ND STAGE BASCILIC VEIN TRANSPOSITION;  Surgeon: Rosetta Posner, MD;  Location: [place];  Service: Vascular;  Laterality: Left;   BIOPSY  10/23/2021   Procedure: BIOPSY;  Surgeon: Jackquline Denmark, MD;  Location[facility];  Service: Gastroenterology;;   BREAST BIOPSY Left    Patient doesn't remember any information from previous procedure.   CAPD REMOVAL N/A 10/31/2019   Procedure: PERITONEAL DIALYSIS  (CAPD) INFECTED CATHETER REMOVAL;  Surgeon: Coralie Keens, MD;  Location: [place];  Service: General;  Laterality: N/A;   CRANIOTOMY N/A 11/01/2020   Procedure: CRANIOTOMY FOR TUMOR EXCISION;  Surgeon: [name], MD;  Location: [place];  Service: Neurosurgery;   Laterality: N/A;   ESOPHAGOGASTRODUODENOSCOPY (EGD) WITH PROPOFOL N/A 10/23/2021   Procedure: ESOPHAGOGASTRODUODENOSCOPY (EGD) WITH PROPOFOL;  Surgeon: Jackquline Denmark, MD;  Location: [place];  Service: Gastroenterology;  Laterality: N/A;   FRACTURE SURGERY     left foot,baby toe nad next toe missing   INSERTION OF DIALYSIS CATHETER Right 10/30/2014   Procedure: INSERTION OF DIALYSIS CATHETER;  Surgeon: Rosetta Posner, MD;  Location: [place];  Service: Vascular;  Laterality: Right;   KIDNEY TRANSPLANT  11/02/2008   [facility])   PARATHYROIDECTOMY N/A 03/18/2022   Procedure: TOTAL PARATHYROIDECTOMY;  Surgeon: Armandina Gemma, MD;  Location: [place];  Service: General;  Laterality: N/A;   PLACEMENT OF LUMBAR DRAIN N/A 11/01/2020   Procedure: PLACEMENT OF LUMBAR DRAIN;  Surgeon: [name], MD;  Location: [place];  Service: Neurosurgery;  Laterality: N/A;   WISDOM TOOTH EXTRACTION       Social History:   reports that she has been smoking cigarettes. She has a 13.50 pack-year smoking history. She has never used smokeless tobacco. She reports that she does not drink alcohol and does not use drugs.   Family History:  Her family history includes Deep vein thrombosis in her brother; Diabetes in her brother; Hyperlipidemia in her sister; Hypertension in her father, mother, sister, and sister; Kidney disease in her brother. There is no history of Colon cancer, Esophageal cancer, Rectal cancer, or Breast cancer.   Allergies Allergies  Allergen Reactions   Penicillins Itching and Rash    Did it involve swelling of the face/tongue/throat, SOB, or low BP?Y Did it involve sudden or severe rash/hives, skin peeling, or any reaction on the inside of your mouth or nose? Y Did you need to seek medical attention at a hospital or doctor's office? Y When did it last happen?  2016     If all above answers are "NO", may proceed with cephalosporin use. 04/07/22> previously tolerated ceftriaxone, cefepime      Home Medications  Prior to Admission medications   Medication Sig Start Date End Date Taking? Authorizing Provider  acetaminophen (TYLENOL) 500 MG tablet Take 500 mg by mouth every 6 (six) hours as needed.   Yes [provider]  albuterol (VENTOLIN HFA) 108 (90 Base) MCG/ACT inhaler Inhale 3 puffs into the lungs daily as needed for wheezing or shortness of breath. 11/12/21 04/05/22 Yes Gaylan Gerold, DO  amLODipine (NORVASC) 10 MG tablet Take 1 tablet (10 mg total) by mouth daily. 07/02/21  Yes Lajean Manes, MD  cloNIDine (CATAPRES) 0.1 MG tablet Take 0.1 mg by mouth 2 (two) times daily. 04/01/22  Yes [provider]  diclofenac Sodium (VOLTAREN) 1 % GEL Apply 2 g topically 2 (two) times daily as needed (Right shoulder pain). Patient taking differently: Apply 1  Application topically daily as needed (Right shoulder pain). 01/20/22  Yes Gaylan Gerold, DO  diphenhydrAMINE (BENADRYL) 25 mg capsule Take 50 mg by mouth 3 (three) times daily as needed for allergies.   Yes [provider]  lidocaine-prilocaine (EMLA) cream Apply 1 application. topically every Monday, Wednesday, and Friday with hemodialysis. 02/01/20  Yes [provider]  sevelamer carbonate (RENVELA) 800 MG tablet Take 1,600 mg by mouth 3 (three) times daily with meals.   Yes [provider]  calcitRIOL (ROCALTROL) 0.5 MCG capsule Take 4 capsules (2 mcg total) by mouth 2 (two) times daily between meals. Patient not taking: Reported on 04/05/2022 03/31/22   Sanjuan Dame, MD  Calcium Carbonate Antacid (CALCIUM CARBONATE, DOSED IN MG ELEMENTAL CALCIUM,) 1250 MG/5ML SUSP Take 24 mLs (2,400 mg of elemental calcium total) by mouth 4 (four) times daily. Patient not taking: Reported on 04/05/2022 03/31/22   Sanjuan Dame, MD  pantoprazole (PROTONIX) 40 MG tablet Take 1 tablet (40 mg total) by mouth 2 (two) times daily before a meal. Patient not taking: Reported on 03/19/2022 10/24/21 02/13/22  Rosezetta Schlatter, MD     Critical care time: 42

## 2022-04-15 NOTE — Progress Notes (Signed)
Hold heparin for profuse tongue bleeding. Start propofol for refractory shaking and biting tongue  Erskine Emery MD PCCM

## 2022-04-15 NOTE — Progress Notes (Addendum)
Consultation Note Date: 04/15/2022   Patient Name: Ruth Gutierrez [id number]  DOB: 21-Nov-1965  MRN: [id number]  Age / Sex: 56 y.o., female  PCP: Gaylan Gerold, DO Referring Physician: Candee Furbish, MD  Reason for Consultation:  goals of care  HPI/Patient Profile: 56 y.o. female  with past medical history of end-stage renal disease status posttransplant and on hemodialysis, recent parathyroidectomy,  admitted on 04/05/2022 with altered mental status and seizures secondary to severe hypocalcemia from her recent parathyroidectomy.  She had cardiac arrest on 9/4 presumed related to aspiration during seizure.  Received CPR for 19 minutes before ROSC.  MRIs on 9/7 and 9/11 indicate diffuse anoxic brain injury.  She continues to be vent dependent.  She opens her eyes to voice but does not track.  Yesterday evening she began to have recurrent seizures.  EEG in process.  Palliative medicine consulted for goals of care.  Primary Decision Maker NEXT OF KIN -she has a daughter- Ruth Gutierrez and mother- [name]  Discussion: Chart extensively reviewed including progress notes from this and previous admissions. I met her mother and daughter yesterday and introduced palliative medicine. Per chart review she was living independently before this admission.  Interesting to note that after she had her parathyroidectomy she left AMA, but was quickly readmitted the next day with hypocalcemia and gastric ulcer. Per discussion with neurology goals of care are for patient to recover to a point where she can cook Thanksgiving dinner. Given her diffuse anoxic brain injury and recurrent seizures, her prognosis to achieve that goal is poor. Providers have recommended transition to full comfort care.  However, family has elected to continue current measures and give time for outcomes.  We had planned on meeting today at 3 PM.  I  remained at the bedside until 315 and family did not arrive.  SUMMARY OF RECOMMENDATIONS -Continue full scope- per chart review appears family is leaning towards comfort- they have stated she would not want a trach or PEG -It is highly unlikely that she will have recovery to the point where she is able to avoid a trach, PEG, or be able to cook Thanksgiving dinner- more likely- even if she is able to be extubated she will be left in as state requiring 24 hour care for the rest of her days with high risk of developing ongoing opportunistic infections including pneumonias and pressure wound infections -Recommend change in code status to DNR and transition to comfort measures when family is ready -Will reach out to family and attempt to reschedule [place] meeting  -Addendum- called family - voicemail full  Code Status/Advance Care Planning: Full code   Prognosis:   Unable to determine  Discharge Planning: To Be Determined  Primary Diagnoses: Present on Admission:  Acute metabolic encephalopathy  Hypocalcemia  Hypertension  Status epilepticus ([place])   Review of Systems  Unable to perform ROS: Mental status change    Physical Exam Vitals and nursing note reviewed.  Constitutional:      Appearance: She is ill-appearing.  Pulmonary:  Comments: vent Neurological:     Comments: Opens eyes to voice, does not track, no response to visual threat, does not move any extremities, does not follow commands     Vital Signs: BP 99/61   Pulse 96   Temp 99.8 F (37.7 C) (Oral)   Resp 20   Ht _0  (1.626 m)   Wt 71.6 kg   LMP 06/02/2015   SpO2 96%   BMI 27.09 kg/m  Pain Scale: CPOT   Pain Score: 0-No pain   SpO2: SpO2: 96 % O2 Device:SpO2: 96 % O2 Flow Rate: .O2 Flow Rate (L/min): 40 L/min  IO: Intake/output summary:  Intake/Output Summary (Last 24 hours) at 04/15/2022 1515 Last data filed at 04/15/2022 1400 Gross per 24 hour  Intake 1694.37 ml  Output 404 ml  Net 1290.37 ml     LBM: Last BM Date : 04/15/22 Baseline Weight: Weight: 70.1 kg Most recent weight: Weight: 71.6 kg       Thank you for this consult. Palliative medicine will continue to follow and assist as needed.  Time Total: 80 minutes Greater than 50%  of this time was spent counseling and coordinating care related to the above assessment and plan.  Signed by: Ruth Gutierrez, AGNP-C Palliative Medicine    Please contact Palliative Medicine Team phone at [phone] for questions and concerns.  For individual provider: See Shea Evans

## 2022-04-15 NOTE — Progress Notes (Signed)
This chaplain is present with the Pt. at the bedside. The family is not present. The RN updated the chaplain. The chaplain offered reassurance of God's love and prayer with the Pt.  Chaplain Sallyanne Kuster [phone]

## 2022-04-15 NOTE — Progress Notes (Signed)
Subjective: Had episode overnight increased tone in left upper extremity which resolved after 4 mg of IV Ativan.  ROS: Unable to obtain due to poor mental status  Examination  Vital signs in last 24 hours: Temp:  [97.5 F (36.4 C)-99 F (37.2 C)] 99 F (37.2 C) (09/12 1123) Pulse Rate:  [82-187] 93 (09/12 1130) Resp:  [22-40] 24 (09/12 1130) BP: (90-192)/(50-142) 112/65 (09/12 1130) SpO2:  [80 %-100 %] 97 % (09/12 1130) FiO2 (%):  [30 %] 30 % (09/12 1130) Weight:  [71.6 kg-75.6 kg] 71.6 kg (09/12 0615)    General: lying in bed, NAD RS: intubated Neuro: opens eyes and has roving eye movements but doesn't track examiner, doesn't follow commands, PERLA, corneal reflex intact, cough reflex intact, withdraws to noxious stimuli in all extremities  Basic Metabolic Panel: Recent Labs  Lab 04/08/22 1731 04/08/22 2015 04/09/22 0407 04/09/22 4098 04/11/22 0406 04/11/22 1400 04/12/22 0150 04/12/22 1015 04/13/22 0215 04/14/22 0333 04/15/22 0815 04/15/22 0817  NA  --   --  133*   < > 131*  --  132*  --  131* 131* 132* 132*  K  --   --  4.0   < > 3.8  --  3.2*  --  3.8 3.9 4.2 4.3  CL  --   --  90*   < > 90*  --  92*  --  92* 90*  --  92*  CO2  --   --  28   < > 26  --  26  --  25 23  --  26  GLUCOSE  --   --  138*   < > 113*  --  138*  --  132* 163*  --  140*  BUN  --   --  24*   < > 23*  --  14  --  27* 47*  --  28*  CREATININE  --   --  4.97*   < > 3.53*  --  1.95*  --  3.90* 5.18*  --  3.36*  CALCIUM  --    < > 6.9*   < > 8.1*   < > 7.8*  --  7.7* 7.8*  --  7.8*  MG 1.8  --  2.2  --   --   --   --   --   --   --   --  2.2  PHOS 2.6  --  3.4  3.5   < > 3.4  --  1.5* 1.6* 2.3*  --   --  2.1*   < > = values in this interval not displayed.    CBC: Recent Labs  Lab 04/11/22 0406 04/12/22 0150 04/13/22 0215 04/14/22 0333 04/15/22 0815 04/15/22 0817  WBC 11.4* 12.6* 15.4* 19.1*  --  25.2*  HGB 8.6* 8.9* 8.6* 8.8* 9.9* 8.7*  HCT 28.3* 28.4* 27.6* 26.8* 29.0* 27.3*  MCV  95.3 92.2 92.0 90.5  --  91.0  PLT 159 167 215 218  --  269     Coagulation Studies: No results for input(s): "LABPROT", "INR" in the last 72 hours.  Imaging No new brain imaging overnight  ASSESSMENT AND PLAN: 34 old female with status epilepticus arising from right frontal region in the setting of severe hypercalcemia and prior meningioma resection.  Had cardiac arrest on 04/07/2022 and MRI brain now showing anoxic brain injury.   Refractory electrographic/nonconvulsive status epilepticus, resolved Acute anoxic encephalopathy Anoxic brain injury Cardiac arrest --Seizure-like episode overnight  with left gaze deviation and increased tone in left upper extremity which resolved with IV Ativan   Recommendations - Continue current dose of Keppra, Vimpat, and Phenytoin '125mg'$  Q8h. Will check phenytoin level and increase if subtherapeutic - Held onfi to minimize sedation yesterday morning.  However looks like seizures have recurred.  If phenytoin level is therapeutic, will resume Onfi -Video EEG restarted today.  Shows background attenuation. - Continue amantadine 100 mg twice daily for neuro stimulation -Patient appears to be awake but minimally interactive, does not track family, does not follow any commands.  Repeat MRI again shows diffuse anoxic brain injury.  If seizures are recurring after reducing AEDs, this further suggests significant brain injury.  Given patient's exam with minimal improvement over the last few days, MRI findings, EEG findings as well as other medical comorbidities, I discussed my concern with family that she has sustained severe neurologic injury and has minimal chances of meaningful neurologic recovery (meaningful for family is patient able to participate in family events like cooking Thanksgiving dinner rather than laying in bed).  Family is also mentioned that patient would not have wanted trach/PEG.  After discussion yesterday, plan was to wait for few more days to look  for improvement and if there is no change, transition to comfort care. -Management of rest of comorbidities per primary team -Discussed plan with critical care team   I have spent a total of  36 minutes with the patient reviewing hospital notes,  test results, labs and examining the patient as well as establishing an assessment and plan.  > 50% of time was spent in direct patient care.      Zeb Com[facility] Epilepsy [facility] For questions after 5pm please refer to AMION to reach the Neurologist on call

## 2022-04-15 NOTE — Progress Notes (Signed)
[facility] Progress Note  Subjective:  HD overnight, 4L UF Seizures during HD, resolved with ativan and given Keppra and Vimpat MRI with persistent anoxic injury, no mass effects Met with family at bedside yesterday; addressed their questions regarding anemia, transfusion thresholds, and UF goals.  Vitals:   04/15/22 0700 04/15/22 0734 04/15/22 0800 04/15/22 0804  BP: (!) 91/53  128/84   Pulse:   (!) 117   Resp: (!) 31  (!) 40   Temp:  98.4 F (36.9 C)    TempSrc:  Oral    SpO2:   98% 100%  Weight:      Height:        Exam: Gen eyes open, no grip or UE/ LE movement; not FC On vent Sclera anicteric, w/ ETT No jvd or bruits Chest clear anterior/ lateral RRR no MRG Abd soft ntnd no mass or ascites +bs Ext no sig edema   LUE AVF +bruit  OP HD: [place] M[facility]  4h  65kg  2/3 bath  LUE AVF  Hep none - hectorol 4 u g tiw - venofer 132m  - mircera 225 q 2, last 8/30    Assessment/ Plan: SP cardiac arrest - on 9/4 AMS - MRI 9/07 and 9/11 showed signs of significant anoxic injury, neuro exam is poor VDRF -  30% FIO2, #2 limits extubation Afib/ RVR - getting per tube cardizem/ metoprolol, HR's 80-110s Volume - Consistently up on bed weights.  4L UF overnight, weight improved.   ESRD - Cont MWF at this time, UF as able 3-4L, using AVF.  Hypocalcemia/ hungry bone syndrome sp recent total parathyroidectomy 8/15. Pt dc'd on CaCO3 2400 elemental qid, but per the family she was not taking any calcium at home. Pt here now w/ seizures and hypocalcemia. Ca++ levels better now after IV boluses and is now back on high dose CaCO3 per NG.  - no high Ca++ bath available here - cont IV vdra (high-dose hectorol) 12 ug tiw IV - Ca 7.8 today today, stable; corrects to mid 9s - continue po CaCO3 at 30044melemental tid not with nutrition HTN - sp Cleviprex gtt, getting cardizem and metoprolol po now, as above Seizures - f/b neurology, getting Keppra, Vimpat, dilantin and Onfi, all  po now Anemia esrd -Hgb 8's, last op mircera 22512mon 8/30, next esa due 9/13 have ordered darbe 200 ug weekly on Wednesdays.   Secondary hyperparathyroidism - see as above > hypocalcemia post PTX. Binders are on hold post PTX; replete P if < 2 History of meningioma - status post resection 11/01/2020  RyaRexene AgentD  04/15/2022, 9:43 AM   Recent Labs  Lab 04/13/22 0215 04/14/22 0333 04/15/22 0815 04/15/22 0817  HGB 8.6* 8.8* 9.9* 8.7*  ALBUMIN 1.9* 1.7*  --  2.0*  CALCIUM 7.7* 7.8*  --  7.8*  PHOS 2.3*  --   --  2.1*  CREATININE 3.90* 5.18*  --  3.36*  K 3.8 3.9 4.2 4.3    No results for input(s): "IRON", "TIBC", "FERRITIN" in the last 168 hours. Inpatient medications:  acetaminophen  650 mg Per Tube Q4H   Or   acetaminophen (TYLENOL) oral liquid 160 mg/5 mL  650 mg Per Tube Q4H   Or   acetaminophen  650 mg Rectal Q4H   [START ON 04/21/2022] amantadine  100 mg Per Tube Weekly   calcium carbonate (dosed in mg elemental calcium)  3,000 mg of elemental calcium Per Tube TID BM   Chlorhexidine  Gluconate Cloth  6 each Topical Q0600   [START ON 2022-05-05] darbepoetin (ARANESP) injection - DIALYSIS  200 mcg Intravenous Q Wed-HD   diltiazem  60 mg Per Tube Q6H   doxercalciferol  12 mcg Intravenous Q M,W,F-HD   feeding supplement (PROSource TF20)  60 mL Per Tube Daily   heparin sodium (porcine)       lacosamide  150 mg Per Tube BID   levETIRAcetam  1,000 mg Per Tube Daily   metoprolol tartrate  25 mg Per Tube BID   mouth rinse  15 mL Mouth Rinse Q2H   pantoprazole  40 mg Per Tube Daily   phenytoin  125 mg Per Tube TID   sodium chloride flush  10-40 mL Intracatheter Q12H    sodium chloride Stopped (04/13/22 1310)   feeding supplement (VITAL 1.5 CAL) 50 mL/hr at 04/15/22 0800   heparin 1,600 Units/hr (04/15/22 0832)   [START ON 04/17/2022] lacosamide (VIMPAT) IV     levETIRAcetam 1,000 mg (04/15/22 0935)   [START ON 04/17/2022] levETIRAcetam     sodium chloride, albuterol,  bisacodyl, diclofenac Sodium, docusate, fentaNYL (SUBLIMAZE) injection, heparin sodium (porcine), hydrALAZINE, lip balm, metoprolol tartrate, mouth rinse, polyethylene glycol, sodium chloride flush

## 2022-04-15 NOTE — Progress Notes (Signed)
ANTICOAGULATION CONSULT NOTE- follow-up  Pharmacy Consult for Heparin Indication: atrial fibrillation  Allergies  Allergen Reactions   Penicillins Itching and Rash    Did it involve swelling of the face/tongue/throat, SOB, or low BP?Y Did it involve sudden or severe rash/hives, skin peeling, or any reaction on the inside of your mouth or nose? Y Did you need to seek medical attention at a hospital or doctor's office? Y When did it last happen?  2016     If all above answers are "NO", may proceed with cephalosporin use. 04/07/22> previously tolerated ceftriaxone, cefepime    Patient Measurements: Height: '5\' 4"'$  (162.6 cm) Weight: 71.6 kg (157 lb 13.6 oz) IBW/kg (Calculated) : 54.7 Heparin Dosing Weight: 70.5 kg  Vital Signs: Temp: 98.4 F (36.9 C) (09/12 0734) Temp Source: Oral (09/12 0734) BP: 128/84 (09/12 0800) Pulse Rate: 117 (09/12 0800)  Labs: Recent Labs    04/13/22 0215 04/14/22 0333 04/15/22 0133 04/15/22 0815 04/15/22 0817  HGB 8.6* 8.8*  --  9.9* 8.7*  HCT 27.6* 26.8*  --  29.0* 27.3*  PLT 215 218  --   --  269  HEPARINUNFRC 0.43 0.53 0.55  --   --   CREATININE 3.90* 5.18*  --   --  3.36*     Estimated Creatinine Clearance: 18.6 mL/min (A) (by C-G formula based on SCr of 3.36 mg/dL (H)).   Medical History: Past Medical History:  Diagnosis Date   Anemia of chronic disease    Arthritis    Deceased-donor kidney transplant    Performed at [facility], April 2010.  Initial ESRD due to HTN nephropathy   Eczema    ESRD (end stage renal disease) ([place])    M/W/F dialysis   FUO (fever of unknown origin) 05/17/2015   GERD (gastroesophageal reflux disease)    Headache(784.0)    History of hyperparathyroidism    Hypertension    Peritonitis ([place]) 10/2019   Shortness of breath    Wears glasses     Assessment: 56 yo female with history of ESRD - HD MWF, HTN, s/p parathyroidectomy, and depression. Renal transplant in 2010 failed. Recent surgery,  parathyroidectomy, on 03/18/2022. Episode of afib with RVR during HD on 9/4. No anticoagulation PTA. Pharmacy consulted for IV heparin. No evidence of bleeding on CT 9/5.   CBC stable, Hgb 8.7, PLT 269.  Continuing minor bleeding/oozing around central line, will empirically reduce rate and monitor for further bleeding. Heparin level 0.55 (therapeutic).   Goal of Therapy:  Heparin level 0.3-0.7 units/mL Monitor platelets by anticoagulation protocol: Yes   Plan:  Decrease heparin infusion slightly to 1600 units/hr Check daily heparin level Monitor daily CBC and s/sx of bleeding   Francena Hanly, PharmD Pharmacy Resident  04/15/2022 10:35 AM

## 2022-04-15 NOTE — Progress Notes (Signed)
LTM EEG hooked up and running - no initial skin breakdown - push button tested - neuro notified. [facility] monitoring.  

## 2022-04-15 NOTE — Progress Notes (Addendum)
eLink Physician-Brief Progress Note Patient Name: Ruth Gutierrez DOB: 1966-01-10 MRN: [id number]   Date of Service  04/15/2022  HPI/Events of Note  Notified of increased shaking.   56/F with anoxic brain injury.    On my camera assessment, pt had quick repetitive blinking of both eyes lasting for several minutes.  RN reported that she has had jerking and shaking intermittently through the night.   Pt given versed '4mg'$  IV which resolved there jerking movement.    eICU Interventions  Repeat EEG in the morning.  Pt is on Keppra, vimpat and phenytoin.   Ativan prn for seizure-like activity.      Intervention Category Major Interventions: Seizures - evaluation and management  Elsie Lincoln 04/15/2022, 3:17 AM  4:19 AM Notified that patient had increasing shaking again.  Pt is opening her eyes and eye roving.   Plan> Contacted neurologist on call who will evaluate the patient tonight.

## 2022-04-15 NOTE — Progress Notes (Addendum)
Brief Neuro Update:  Was asked to evaluate Ms. Ruth Gutierrez overnight for concern for seizure vs shivering. On my evaluation, she has a left gaze deviation with increased tone in LUE compared to RUE. Episode highly concerning for clinical seizure. She was 2 hours into her HD session when this noted. Earlier, she was doing this intermittently. She is high risk for seizures in the setting of anoxic injury, meningiomas and hypocalcemia.I suspect that clearance of Keppra and Vimpat during HD likely added to this and caused this.  Recs: - Ativan '4mg'$  IV once - Keppra '1000mg'$  Iv once after HD and changing maintenance Keppra to give additional '500mg'$  IV Keppra after each HD session. - vimpat '150mg'$  Iv once after HD and changing maintenance Vimpat to give additional '100mg'$  IV Vimpat after each HD session. - If plan is for her to be full code, recommend cEEG with MRI safe leads when the techs get here at 0700.  Update: Episode resolved with '4mg'$  of Ativan with resolution of tonic extension of LUE and L gaze deviation.  [place] Pager Number [phone]

## 2022-04-15 NOTE — Progress Notes (Signed)
   04/15/22 0615  Vitals  BP (!) 90/54  Pulse Rate (!) 118  Resp (!) 30  Post Treatment  Dialyzer Clearance Heavily streaked  Duration of HD Treatment -hour(s) 4 hour(s)  Liters Processed 96  Fluid Removed 4  Tolerated HD Treatment Yes  Post-Hemodialysis Comments Keppra poss. dialized out.  AVG/AVF Arterial Site Held (minutes) 15 minutes  AVG/AVF Venous Site Held (minutes) 15 minutes   TX fin. W/O difficulty, keppra possible. Dialyzed out

## 2022-04-16 ENCOUNTER — Inpatient Hospital Stay (HOSPITAL_COMMUNITY): Payer: Medicare Other

## 2022-04-16 DIAGNOSIS — Z515 Encounter for palliative care: Secondary | ICD-10-CM

## 2022-04-16 DIAGNOSIS — R4182 Altered mental status, unspecified: Secondary | ICD-10-CM

## 2022-04-16 DIAGNOSIS — G9341 Metabolic encephalopathy: Secondary | ICD-10-CM | POA: Diagnosis not present

## 2022-04-16 DIAGNOSIS — G40901 Epilepsy, unspecified, not intractable, with status epilepticus: Secondary | ICD-10-CM | POA: Diagnosis not present

## 2022-04-16 DIAGNOSIS — N186 End stage renal disease: Secondary | ICD-10-CM | POA: Diagnosis not present

## 2022-04-16 DIAGNOSIS — G931 Anoxic brain damage, not elsewhere classified: Secondary | ICD-10-CM | POA: Diagnosis not present

## 2022-04-16 LAB — BASIC METABOLIC PANEL
Anion gap: 17 — ABNORMAL HIGH (ref 5–15)
BUN: 43 mg/dL — ABNORMAL HIGH (ref 6–20)
CO2: 25 mmol/L (ref 22–32)
Calcium: 7.5 mg/dL — ABNORMAL LOW (ref 8.9–10.3)
Chloride: 90 mmol/L — ABNORMAL LOW (ref 98–111)
Creatinine, Ser: 4.68 mg/dL — ABNORMAL HIGH (ref 0.44–1.00)
GFR, Estimated: 11 mL/min — ABNORMAL LOW (ref >60)
Glucose, Bld: 111 mg/dL — ABNORMAL HIGH (ref 70–99)
Potassium: 4.5 mmol/L (ref 3.5–5.1)
Sodium: 132 mmol/L — ABNORMAL LOW (ref 135–145)

## 2022-04-16 LAB — CBC
HCT: 25.4 % — ABNORMAL LOW (ref 36.0–46.0)
Hemoglobin: 7.9 g/dL — ABNORMAL LOW (ref 12.0–15.0)
MCH: 28.8 pg (ref 26.0–34.0)
MCHC: 31.1 g/dL (ref 30.0–36.0)
MCV: 92.7 fL (ref 80.0–100.0)
Platelets: 271 10*3/uL (ref 150–400)
RBC: 2.74 MIL/uL — ABNORMAL LOW (ref 3.87–5.11)
RDW: 20.7 % — ABNORMAL HIGH (ref 11.5–15.5)
WBC: 22.9 10*3/uL — ABNORMAL HIGH (ref 4.0–10.5)
nRBC: 0.1 % (ref 0.0–0.2)

## 2022-04-16 LAB — PROCALCITONIN: Procalcitonin: 20.66 ng/mL

## 2022-04-16 LAB — GLUCOSE, CAPILLARY
Glucose-Capillary: 117 mg/dL — ABNORMAL HIGH (ref 70–99)
Glucose-Capillary: 86 mg/dL (ref 70–99)
Glucose-Capillary: 76 mg/dL (ref 70–99)

## 2022-04-16 LAB — MAGNESIUM: Magnesium: 2.3 mg/dL (ref 1.7–2.4)

## 2022-04-16 LAB — TRIGLYCERIDES: Triglycerides: 137 mg/dL (ref <150)

## 2022-04-16 LAB — PHOSPHORUS: Phosphorus: 2.6 mg/dL (ref 2.5–4.6)

## 2022-04-16 MED ORDER — MIDAZOLAM HCL 2 MG/2ML IJ SOLN
2.0000 mg | INTRAMUSCULAR | Status: DC | PRN
  Administered 2022-04-16: 2 mg via INTRAVENOUS
  Filled 2022-04-16: qty 2

## 2022-04-16 MED ORDER — FENTANYL BOLUS VIA INFUSION
100.0000 ug | INTRAVENOUS | Status: DC | PRN
  Administered 2022-04-16: 100 ug via INTRAVENOUS

## 2022-04-16 MED ORDER — FENTANYL 2500MCG IN NS 250ML (10MCG/ML) PREMIX INFUSION
0.0000 ug/h | INTRAVENOUS | Status: DC
  Filled 2022-04-16: qty 250

## 2022-04-16 MED ORDER — GLYCOPYRROLATE 0.2 MG/ML IJ SOLN: 0.2000 mg | INTRAMUSCULAR | Status: DC | PRN

## 2022-04-16 MED ORDER — METOCLOPRAMIDE HCL 5 MG/ML IJ SOLN: 5.0000 mg | Freq: Four times a day (QID) | INTRAMUSCULAR | Status: DC

## 2022-04-16 MED ORDER — SODIUM CHLORIDE 0.9 % IV SOLN: INTRAVENOUS | Status: DC

## 2022-04-16 MED ORDER — ACETAMINOPHEN 325 MG PO TABS: 650.0000 mg | ORAL_TABLET | Freq: Four times a day (QID) | ORAL | Status: DC | PRN

## 2022-04-16 MED ORDER — METOCLOPRAMIDE HCL 5 MG/ML IJ SOLN
5.0000 mg | Freq: Three times a day (TID) | INTRAMUSCULAR | Status: DC
  Administered 2022-04-16: 5 mg via INTRAVENOUS
  Filled 2022-04-16: qty 2

## 2022-04-16 MED ORDER — POLYVINYL ALCOHOL 1.4 % OP SOLN: 1.0000 [drp] | Freq: Four times a day (QID) | OPHTHALMIC | Status: DC | PRN

## 2022-04-16 MED ORDER — GLYCOPYRROLATE 1 MG PO TABS: 1.0000 mg | ORAL_TABLET | ORAL | Status: DC | PRN

## 2022-04-16 MED ORDER — ACETAMINOPHEN 650 MG RE SUPP: 650.0000 mg | Freq: Four times a day (QID) | RECTAL | Status: DC | PRN

## 2022-05-04 NOTE — Progress Notes (Signed)
This chaplain responded to PMT referral for spiritual care with the family before the Pt. compassionate extubation.   A combination of prayer, music, and scripture offered a reflection of the Pt. and her faith. The chaplain's intercessory prayer was accepted by the Pt. mother as the chaplain stepped out of the room. The chaplain understands the family will update the RN-Lesley when they are ready for extubation.  This chaplain is available for F/U spiritual care as needed.  Chaplain Sallyanne Kuster [phone]

## 2022-05-04 NOTE — Progress Notes (Signed)
EEG tech attempted d/c. Family in the room having their moment with the patient. Informed RN to call the lab once the relatives are done/gone.

## 2022-05-04 NOTE — Progress Notes (Signed)
  Interdisciplinary Goals of Care Family Meeting   Date carried out: May 01, 2022  Location of the meeting: Bedside  Member's involved: Physician, Bedside Registered Nurse, Social Worker, Family Member or next of kin, Palliative care team member, and Other: Neurology  Durable Power of Attorney or acting medical decision maker: Daughter, husband    Discussion: We discussed goals of care for Ruth Gutierrez .  With progressive neurological deterioration we are transitioning to inpatient comfort care and allowing natural comfortable death in hospital.  Code status: Full DNR  Disposition: In-patient comfort care  Time spent for the meeting: 5 minutes    Candee Furbish, MD  05-01-22, 10:32 AM

## 2022-05-04 NOTE — Progress Notes (Signed)
LTM maint complete - no skin breakdown under: Fp1 Fp2 [facility] monitored, Event button test confirmed by [facility].  

## 2022-05-04 NOTE — Progress Notes (Signed)
NAME:  Ruth Gutierrez, MRN:  [id number], DOB:  Sep 02, 1965, LOS: 37 ADMISSION DATE:  04/15/2022, CONSULTATION DATE:  04/07/2022 REFERRING MD:  Dr. Philipp Ovens, CHIEF COMPLAINT:  Acute Respiratory Failure, IHCA, Anoxic Brain Injury   History of Present Illness:  56 y/o female with history of ESRD s/p renal transplant on HD MWF, HTN, s/p parathyroidectomy, and depression. Admitted to ICU following cardiac arrest due to aspiration caused by seizure.  Now critically ill due to acute hypoxic respiratory failure requiring mechanical ventilation.  Pertinent  Medical History   Past Medical History:  Diagnosis Date   Anemia of chronic disease    Arthritis    Deceased-donor kidney transplant    Performed at [facility], April 2010.  Initial ESRD due to HTN nephropathy   Eczema    ESRD (end stage renal disease) ([place])    M/W/F dialysis   FUO (fever of unknown origin) 05/17/2015   GERD (gastroesophageal reflux disease)    Headache(784.0)    History of hyperparathyroidism    Hypertension    Peritonitis ([place]) 10/2019   Shortness of breath    Wears glasses      Significant Hospital Events: Including procedures, antibiotic start and stop dates in addition to other pertinent events   9/1 - admitted with AMS found to have seizures on EEG 9/3 - NG tube placed 9/4 - gradual increase in oxygen requirements, code blue at 1150, intubated and transferred to ICU. Went into afib with rvr during HD, started amio drip 9/5 - amiodarone stopped, metoprolol started, CT head unchanged no bleed, heparin gtt, remains on vent. After RR increased ABG showed hypoxia and alkalosis. Settings changed. Propofol weaned off, fentanyl added 9/6 - placed back on propofol for agitation and dyssynchrony on the vent overnight, showed cheyne stokes breathing pattern  9/7 - remains on vent and sedated, MRI for anoxic evaluation 9/8 - stable and minimally responsive, MRI showed diffuse hypoxic injury 9/9 - Goals of care discussion  with available family, discussed patient's poor prognosis with diffuse anoxic brain injury, plan to let family process and discuss patient's wishes before revisiting the discussion on 9/11 9/11 - Repeat MRI head with stable anoxic brain injury. Patient opens her eyes but is not tracking or responding to stimuli. Further goals of care discussions result with family wanting to watch her for a few more days.  9/12 - Seizure like activity overnight and in the AM aborted with ativan, EEG started. Episode of vomiting when head was manipulated for EEG leads. Bleeding from tongue biting noted, heparin stopped, propofol started.  9/13 - continued short intermittent myoclonic episodes despite propofol. No improvement in status or neuro exam.  Interim History / Subjective:  She is showing no signs of interval improvement and appears to have refractory myoclonic seizures.   Objective   Blood pressure 116/80, pulse 97, temperature 100.1 F (37.8 C), temperature source Esophageal, resp. rate (!) 22, height '5\' 4"'$  (1.626 m), weight 71.6 kg, last menstrual period 06/02/2015, SpO2 100 %.    Vent Mode: PRVC FiO2 (%):  [30 %] 30 % Set Rate:  [18 bmp] 18 bmp Vt Set:  [440 mL] 440 mL PEEP:  [5 cmH20] 5 cmH20 Plateau Pressure:  [14 cmH20-24 cmH20] 17 cmH20   Intake/Output Summary (Last 24 hours) at 04/29/22 0554 Last data filed at 04/29/22 0000 Gross per 24 hour  Intake 1547.92 ml  Output 504 ml  Net 1043.92 ml   Filed Weights   04/14/22 0331 04/15/22 0142 04/15/22 2952  Weight: 75.6 kg 75.6 kg 71.6 kg    Examination: General: Ill appearing female laying in bed, intubated HENT: [place]/AT, PERRL Lungs: Ventilated breath sounds bilaterally Cardiovascular: RRR Abdomen: soft Extremities:  no resistance to passive ROM testing in the upper extremities Neuro:  corneal reflex intact, eyes open, not tracking objects in space, no response to painful stimuli  Resolved Hospital Problem list     Assessment &  Plan:  Acute Respiratory Failure ARDS 2/2 aspiration IHCA  Pt had a seizure, agonal breathing, and lost a pulse on 9/4 with ROSC after 19 minutes of CPR. Intubated and sedated. CXR with diffuse infiltrates. TTM orders in if needed, patient has been normothermic after short period of fevers on 9/4. Showed Cheyne-Stokes breathing pattern and MRI showed anoxic brain injury. Sedation is off and patient is frequently breathing over the vent with RR in the 30-40 range. - Continue vent management off sedation, plan for extubation trial (convert to tracheostomy vs palliative, based on goals of care discussions) - 5 day course ceftriaxone completed 9/9   Status Epilepticus Anoxic Brain Injury Suspected bifrontal region origin. Has been stable phenytoin, keppra, and vimpat. Neurology following. No seizures on EEG for 48 hours plus, EEG d/c'd on 9/7. MRI showed diffuse anoxic brain injury, repeat was largely unchanged. Seizure like activity on 9/12 aborted with ativan, likely 2/2 to increased clearance of medication in HD, post HD medication additions. Amantadine was started on 9/8 at 200 mg daily, HD dosing is 200 mg weekly, this was changed.  Phenytoin level is subtherapeutic on 9/12. 9/12 patient showed evidence of continued seizure-like activity with tongue biting causing bleeding.  Heparin was stopped and propofol was started. Pt continues to have seizure like activity. We will discuss with neurology for further management.  With continued unresponsive state, evidence of diffuse anoxic brain injury, and now return of seizures she has a very poor prognosis. We will continue to work with neurology on management as we continue goals of care discussions with family. - medication per neurology - EEG - wean propofol as tolerated   Hypocalcemia Post Parathyroidectomy Calcium of 5.5, ionized 0.62 on admission. Responded well to aggressive repletion. Corrected calcium 9.1 today. - Continue with repletion  -  Will need high Ca++ bath for outpatient dialysis   Leukocytosis WBC increased from 15.4 to 25.2 over 2 days, down to 22.9 today. Unknown source of infection at this point. May be due to leukemoid reaction 2/2 seizures as above. Scheduled tylenol stopped. Fever overnight to 101.8 that aborted without cooling or antipyretics.  - Continue to monitor for fever and WBC trend - EEG, continue seizure meds per neuro   Acid-Base Imbalance Initially presented with pure AGMA with gap of 20 and bicarb of 19, lactate of 6.9. ABG showed pH of 7.248 with elevated pCO2 and bicarb of 21, vent settings were changed to manage. Pt on calcium carbonate.  Most recent ABG shows a pH of 7.562 after she was breathing over the vent.  Monitoring with daily BMP which shows a fluctuating gap.  Changes are mostly due to uremia between dialysis.  Gap of 17 today with a BUN increase of 15. - We will continue to monitor and treat as appropriate   ESRD on HD MWF Renal transplant in 2010, failed. Appreciate nephrology's management.  - Continue HD per nephrology   Afib w/ RVR Episode during HD on 9/4. Amiodarone gtt started and then stopped on 9/6. Stable on BB/CCB.  - continue metoprolol and diltiazem  -  heparin gtt, on hold due to tongue biting with bleeding  Emesis Pt had a couple episodes of emesis last night, abdomen is soft but bowel sounds are diminished, reported 200 mL of output from flexiseal. QT on monitor of 350-400. - KUB, start metoclopramide 5 mg q8h IV   Hypertension Previously on amlodipine 10 mg daily. Has been on cleviprex gtt intermittently. BB as above. We will continue to monitor. - PRN hydralazine   Hypoglycemia Pt was hypoglycemic overnight 9/6 and d10 gtt was added, recurred overnight 9/8, D10 restarted. Off tube feeds still, will continue to monitor. Continue TF.   EKG Abnormalities ST elevations seen on telemetry. Unable to assess for symptoms, hemodynamically stable, troponin likely elevated  post CPR. EKG showed possible PR depression and biphasic P waves in V1 and V2. Question of inverted T waves in II/III. Echo in 10/2020 showed moderate concentric LVH with EF of 60-65%, mild left atrial dilation, mild elevation in pulmonary artery pressure, moderate TR, and right atrial pressure of 3 mmHg. Echo showed EF 65%, mild concentric LVH, AV sclerosis without stenosis, and RAP 3 mmHg.   Hyperthyroidism TSH undetectable with elevated free T4 and normal T3 initially, now normal free T4 and low T3. This pattern likely represents subclinical hyperthyroidism with some transient effects of amiodarone. As we could not do a RAIU scan due to recent amiodarone bolus and with no reported hyperthyroid symptoms prior to admission we elected not to treat. Trab and TSH ab negative.    Advanced Care Planning Pt is critically ill with evidence of anoxic brain injury and minimal response. She has a very poor prognosis and we will have repeat discussions with the family today about goals of care. Will continue full scope of care as we continue our discussions. Palliative consulted.   Best Practice (right click and "Reselect all SmartList Selections" daily)   Diet/type: tubefeeds DVT prophylaxis: other heparin on hold due to tongue bleeding from biting GI prophylaxis: PPI Lines: Central line R Fem Foley:  Yes, and it is still needed Code Status:  full code Last date of multidisciplinary goals of care discussion [9/13]  Labs   CBC: Recent Labs  Lab 04/12/22 0150 04/13/22 0215 04/14/22 0333 04/15/22 0815 04/15/22 0817 05-05-2022 0332  WBC 12.6* 15.4* 19.1*  --  25.2* 22.9*  HGB 8.9* 8.6* 8.8* 9.9* 8.7* 7.9*  HCT 28.4* 27.6* 26.8* 29.0* 27.3* 25.4*  MCV 92.2 92.0 90.5  --  91.0 92.7  PLT 167 215 218  --  269 016    Basic Metabolic Panel: Recent Labs  Lab 04/12/22 0150 04/12/22 1015 04/13/22 0215 04/14/22 0333 04/15/22 0815 04/15/22 0817 05-May-2022 0332  NA 132*  --  131* 131* 132* 132* 132*   K 3.2*  --  3.8 3.9 4.2 4.3 4.5  CL 92*  --  92* 90*  --  92* 90*  CO2 26  --  25 23  --  26 25  GLUCOSE 138*  --  132* 163*  --  140* 111*  BUN 14  --  27* 47*  --  28* 43*  CREATININE 1.95*  --  3.90* 5.18*  --  3.36* 4.68*  CALCIUM 7.8*  --  7.7* 7.8*  --  7.8* 7.5*  MG  --   --   --   --   --  2.2 2.3  PHOS 1.5* 1.6* 2.3*  --   --  2.1* 2.6   GFR: Estimated Creatinine Clearance: 13.3 mL/min (A) (by C-G formula  based on SCr of 4.68 mg/dL (H)). Recent Labs  Lab 04/09/22 0916 04/09/22 1054 04/10/22 0339 04/13/22 0215 04/14/22 0333 04/15/22 0817 05/05/2022 0332  PROCALCITON  --   --   --   --   --   --  20.66  WBC  --   --    < > 15.4* 19.1* 25.2* 22.9*  LATICACIDVEN 0.7 1.2  --   --   --   --   --    < > = values in this interval not displayed.    Liver Function Tests: Recent Labs  Lab 04/11/22 0406 04/12/22 0150 04/13/22 0215 04/14/22 0333 04/15/22 0817  AST  --   --   --  18 31  ALT  --   --   --  19 20  ALKPHOS  --   --   --  348* 361*  BILITOT  --   --   --  0.5 0.6  PROT  --   --   --  6.0* 6.9  ALBUMIN 2.1* 1.9* 1.9* 1.7* 2.0*   No results for input(s): "LIPASE", "AMYLASE" in the last 168 hours. No results for input(s): "AMMONIA" in the last 168 hours.  ABG    Component Value Date/Time   PHART 7.562 (H) 04/15/2022 0815   PCO2ART 30.6 (L) 04/15/2022 0815   PO2ART 88 04/15/2022 0815   HCO3 27.6 04/15/2022 0815   TCO2 29 04/15/2022 0815   ACIDBASEDEF 6.0 (H) 04/07/2022 1308   O2SAT 98 04/15/2022 0815     Coagulation Profile: No results for input(s): "INR", "PROTIME" in the last 168 hours.  Cardiac Enzymes: No results for input(s): "CKTOTAL", "CKMB", "CKMBINDEX", "TROPONINI" in the last 168 hours.  HbA1C: Hemoglobin A1C  Date/Time Value Ref Range Status  12/20/2020 10:01 AM 5.0 4.0 - 5.6 % Final    CBG: Recent Labs  Lab 04/15/22 0733 04/15/22 1121 04/15/22 1510 04/15/22 2258 May 05, 2022 0328  GLUCAP 128* 104* 125* 132* 86    Review of  Systems:   Unable to obtain.  Past Medical History:  She,  has a past medical history of Anemia of chronic disease, Arthritis, Deceased-donor kidney transplant, Eczema, ESRD (end stage renal disease) ([place]), FUO (fever of unknown origin) (05/17/2015), GERD (gastroesophageal reflux disease), Headache(784.0), History of hyperparathyroidism, Hypertension, Peritonitis ([place]) (10/2019), Shortness of breath, and Wears glasses.   Surgical History:   Past Surgical History:  Procedure Laterality Date   A/V FISTULAGRAM Left 02/12/2017   Procedure: A/V Fistulagram;  Surgeon: Algernon Huxley, MD;  Location: [place] [facility];  Service: Cardiovascular;  Laterality: Left;   A/V FISTULAGRAM Left 12/30/2017   Procedure: A/V FISTULAGRAM;  Surgeon: Algernon Huxley, MD;  Location: [place] [facility];  Service: Cardiovascular;  Laterality: Left;   A/V FISTULAGRAM Left 04/01/2021   Procedure: A/V FISTULAGRAM;  Surgeon: Algernon Huxley, MD;  Location: [place] [facility];  Service: Cardiovascular;  Laterality: Left;   A/V SHUNT INTERVENTION N/A 02/12/2017   Procedure: A/V Shunt Intervention;  Surgeon: Algernon Huxley, MD;  Location: [place] [facility];  Service: Cardiovascular;  Laterality: N/A;   AV FISTULA PLACEMENT     BASCILIC VEIN TRANSPOSITION Left 10/30/2014   Procedure: LEFT [place];  Surgeon: Rosetta Posner, MD;  Location: [place];  Service: Vascular;  Laterality: Left;   [place] Left 01/03/2015   Procedure: LEFT ARM 2ND STAGE [place];  Surgeon: Rosetta Posner, MD;  Location: [place];  Service: Vascular;  Laterality: Left;   BIOPSY  10/23/2021   Procedure: BIOPSY;  Surgeon: Jackquline Denmark, MD;  Location: [facility];  Service: Gastroenterology;;   BREAST BIOPSY Left    Patient doesn't remember any information from previous procedure.   CAPD REMOVAL N/A 10/31/2019   Procedure: PERITONEAL DIALYSIS  (CAPD) INFECTED CATHETER REMOVAL;  Surgeon: Coralie Keens, MD;  Location: [place];  Service: General;  Laterality: N/A;   CRANIOTOMY N/A 11/01/2020   Procedure: CRANIOTOMY FOR TUMOR EXCISION;  Surgeon: [name], MD;  Location: [place];  Service: Neurosurgery;  Laterality: N/A;   ESOPHAGOGASTRODUODENOSCOPY (EGD) WITH PROPOFOL N/A 10/23/2021   Procedure: ESOPHAGOGASTRODUODENOSCOPY (EGD) WITH PROPOFOL;  Surgeon: Jackquline Denmark, MD;  Location: [place];  Service: Gastroenterology;  Laterality: N/A;   FRACTURE SURGERY     left foot,baby toe nad next toe missing   INSERTION OF DIALYSIS CATHETER Right 10/30/2014   Procedure: INSERTION OF DIALYSIS CATHETER;  Surgeon: Rosetta Posner, MD;  Location: [place];  Service: Vascular;  Laterality: Right;   KIDNEY TRANSPLANT  11/02/2008   [facility])   PARATHYROIDECTOMY N/A 03/18/2022   Procedure: TOTAL PARATHYROIDECTOMY;  Surgeon: Armandina Gemma, MD;  Location: [place];  Service: General;  Laterality: N/A;   PLACEMENT OF LUMBAR DRAIN N/A 11/01/2020   Procedure: PLACEMENT OF LUMBAR DRAIN;  Surgeon: [name], MD;  Location: [place];  Service: Neurosurgery;  Laterality: N/A;   WISDOM TOOTH EXTRACTION       Social History:   reports that she has been smoking cigarettes. She has a 13.50 pack-year smoking history. She has never used smokeless tobacco. She reports that she does not drink alcohol and does not use drugs.   Family History:  Her family history includes Deep vein thrombosis in her brother; Diabetes in her brother; Hyperlipidemia in her sister; Hypertension in her father, mother, sister, and sister; Kidney disease in her brother. There is no history of Colon cancer, Esophageal cancer, Rectal cancer, or Breast cancer.   Allergies Allergies  Allergen Reactions   Penicillins Itching and Rash    Did it involve swelling of the face/tongue/throat, SOB, or low BP?Y Did it involve sudden or severe rash/hives, skin peeling, or any reaction on the inside of your mouth or nose? Y Did you need  to seek medical attention at a hospital or doctor's office? Y When did it last happen?  2016     If all above answers are "NO", may proceed with cephalosporin use. 04/07/22> previously tolerated ceftriaxone, cefepime     Home Medications  Prior to Admission medications   Medication Sig Start Date End Date Taking? Authorizing Provider  acetaminophen (TYLENOL) 500 MG tablet Take 500 mg by mouth every 6 (six) hours as needed.   Yes [provider]  albuterol (VENTOLIN HFA) 108 (90 Base) MCG/ACT inhaler Inhale 3 puffs into the lungs daily as needed for wheezing or shortness of breath. 11/12/21 04/05/22 Yes Gaylan Gerold, DO  amLODipine (NORVASC) 10 MG tablet Take 1 tablet (10 mg total) by mouth daily. 07/02/21  Yes Lajean Manes, MD  cloNIDine (CATAPRES) 0.1 MG tablet Take 0.1 mg by mouth 2 (two) times daily. 04/01/22  Yes [provider]  diclofenac Sodium (VOLTAREN) 1 % GEL Apply 2 g topically 2 (two) times daily as needed (Right shoulder pain). Patient taking differently: Apply 1 Application topically daily as needed (Right shoulder pain). 01/20/22  Yes Gaylan Gerold, DO  diphenhydrAMINE (BENADRYL) 25 mg capsule Take 50 mg by mouth 3 (three) times daily  as needed for allergies.   Yes [provider]  lidocaine-prilocaine (EMLA) cream Apply 1 application. topically every Monday, Wednesday, and Friday with hemodialysis. 02/01/20  Yes [provider]  sevelamer carbonate (RENVELA) 800 MG tablet Take 1,600 mg by mouth 3 (three) times daily with meals.   Yes [provider]  calcitRIOL (ROCALTROL) 0.5 MCG capsule Take 4 capsules (2 mcg total) by mouth 2 (two) times daily between meals. Patient not taking: Reported on 04/05/2022 03/31/22   Sanjuan Dame, MD  Calcium Carbonate Antacid (CALCIUM CARBONATE, DOSED IN MG ELEMENTAL CALCIUM,) 1250 MG/5ML SUSP Take 24 mLs (2,400 mg of elemental calcium total) by mouth 4 (four) times daily. Patient not taking: Reported on  04/05/2022 03/31/22   Sanjuan Dame, MD  pantoprazole (PROTONIX) 40 MG tablet Take 1 tablet (40 mg total) by mouth 2 (two) times daily before a meal. Patient not taking: Reported on 03/19/2022 10/24/21 02/13/22  Rosezetta Schlatter, MD     Critical care time: 61

## 2022-05-04 NOTE — Procedures (Addendum)
Patient Name: Ruth Gutierrez  MRN: [id number]  Epilepsy Attending: Lora Havens  Referring Physician/Provider: Lora Havens, MD Duration: 04/15/2022 0950 to 2022-05-01  0950   Patient history: 56 y.o. female with PMH significant forESRD on HD, headaches, history of meningioma with bifrontal severe vasogenic edema s/p resection in April 2022, HTN who became confused and lethargic during HD and was sent to the ED. EEG to evaluate for seizure   Level of alertness:  lethargic    AEDs during EEG study: LEV, PHT, LCM, Clonazepam, propofol   Technical aspects: This EEG study was done with scalp electrodes positioned according to the 10-20 International system of electrode placement. Electrical activity was reviewed with band pass filter of 1-'70Hz'$ , sensitivity of 7 uV/mm, display speed of 60m/sec with a '60Hz'$  notched filter applied as appropriate. EEG data were recorded continuously and digitally stored.  Video monitoring was available and reviewed as appropriate.   Description: At the beginning of the study, EEG showed near continuous background suppression. Low amplitude 3 to 5 Hz theta- delta slowing was also noted in bifrontal region.  Event button was pressed on 04/15/2022 at 1551.  Per RN, patient had head tremor/shaking.  Concomitant EEG before, during and after the event did not show any EEG changes suggest seizure.  At times patient was also noted to have whole-body tremor-like movements.  Concomitant EEG before, during and after the event did not show any EEG changes suggest seizure.   ABNORMALITY -Continue slow, bifrontal region - Background suppression, generalized   IMPRESSION: This study is suggestive of cortical  dysfunction arising from bifrontal region as well as profound diffuse encephalopathy, most likely secondary to anoxic brain injury in the setting of cardiac arrest.  No definite seizure was seen during the study.  Event button was pressed on 04/15/2022 at 1551 for  head tremor without concomitant EEG change.  This was not an epileptic event.  Patient was also noted to have whole-body tremor-like movements without concomitant EEG change.  These episodes are most likely not epileptic. Subcortical myoclonus could have similar appearance.    Temple Sporer OBarbra Sarks

## 2022-05-04 NOTE — Progress Notes (Signed)
LTM EEG discontinued - no skin breakdown at unhook.   

## 2022-05-04 NOTE — Progress Notes (Signed)
Subjective: worsened yesterday with whole body tremor/shivering as well as eyelid twitching, increased respiratory rate. Propofol was restarted.  ROS: Unable to obtain due to poor mental status  Examination  Vital signs in last 24 hours: Temp:  [98.4 F (36.9 C)-101.8 F (38.8 C)] 98.4 F (36.9 C) (09/13 0732) Pulse Rate:  [89-191] 106 (09/13 1000) Resp:  [15-36] 36 (09/13 1000) BP: (93-189)/(48-97) 189/85 (09/13 1000) SpO2:  [57 %-100 %] 100 % (09/13 1000) FiO2 (%):  [30 %] 30 % (09/13 0749) Weight:  [77 kg] 77 kg (09/13 0500)  General: lying in bed, NAD RS: intubated Neuro: On propofol '@40mcg'$ /hr, when propofol is held, opens eyes but doesn't track examiner, doesn't follow commands, PERLA, corneal reflex intact, cough reflex intact, withdraws to noxious stimuli in LUE, doesn't withdraw in other extremities, had whole body shivering and left>right eyelid twitching.   Basic Metabolic Panel: Recent Labs  Lab 04/12/22 0150 04/12/22 1015 04/13/22 0215 04/14/22 0333 04/15/22 0815 04/15/22 0817 05/02/2022 0332  NA 132*  --  131* 131* 132* 132* 132*  K 3.2*  --  3.8 3.9 4.2 4.3 4.5  CL 92*  --  92* 90*  --  92* 90*  CO2 26  --  25 23  --  26 25  GLUCOSE 138*  --  132* 163*  --  140* 111*  BUN 14  --  27* 47*  --  28* 43*  CREATININE 1.95*  --  3.90* 5.18*  --  3.36* 4.68*  CALCIUM 7.8*  --  7.7* 7.8*  --  7.8* 7.5*  MG  --   --   --   --   --  2.2 2.3  PHOS 1.5* 1.6* 2.3*  --   --  2.1* 2.6    CBC: Recent Labs  Lab 04/12/22 0150 04/13/22 0215 04/14/22 0333 04/15/22 0815 04/15/22 0817 05/02/22 0332  WBC 12.6* 15.4* 19.1*  --  25.2* 22.9*  HGB 8.9* 8.6* 8.8* 9.9* 8.7* 7.9*  HCT 28.4* 27.6* 26.8* 29.0* 27.3* 25.4*  MCV 92.2 92.0 90.5  --  91.0 92.7  PLT 167 215 218  --  269 271     Coagulation Studies: No results for input(s): "LABPROT", "INR" in the last 72 hours.  Imaging No new brain imaging overnight   ASSESSMENT AND PLAN: 56 old female with status  epilepticus arising from right frontal region in the setting of severe hypercalcemia and prior meningioma resection.  Had cardiac arrest on 04/07/2022 and MRI brain now showing anoxic brain injury.   Refractory electrographic/nonconvulsive status epilepticus, resolved Acute anoxic encephalopathy Anoxic brain injury Cardiac arrest Myoclonus - No eeg changes, likely subcortical myoclonus   Recommendations - Continue propofol '@40mcg'$ /hr to minimize myoclonus  - Continue current dose of Keppra, Vimpat, and Phenytoin '150mg'$  Q8h and Clonazepam 0.'5mg'$  BID - Continue amantadine 100 mg twice daily for neuro stimulation - Patient has had worsening overnight now with myoclonus. Discussed with family that this is most likely indicative of severe irreversible neurological brain injury with minimal to no chances of meaningful recovery.  Family at bedside transitioning to comfort care with terminal extubation   I have spent a total of  36 minutes with the patient reviewing hospital notes,  test results, labs and examining the patient as well as establishing an assessment and plan.  > 50% of time was spent in direct patient care.   Zeb Comfort Epilepsy [facility] For questions after 5pm please refer to AMION to reach the Neurologist on call

## 2022-05-04 NOTE — Procedures (Signed)
Patient Name: Ruth Gutierrez  MRN: [id number]  Epilepsy Attending: Lora Havens  Referring Physician/Provider: Lora Havens, MD Duration: 04/25/22 0950 to 04/25/2022   1222   Patient history: 56 y.o. female with PMH significant forESRD on HD, headaches, history of meningioma with bifrontal severe vasogenic edema s/p resection in April 2022, HTN who became confused and lethargic during HD and was sent to the ED. EEG to evaluate for seizure   Level of alertness:  lethargic    AEDs during EEG study: LEV, PHT, LCM, Clonazepam, propofol   Technical aspects: This EEG study was done with scalp electrodes positioned according to the 10-20 International system of electrode placement. Electrical activity was reviewed with band pass filter of 1-'70Hz'$ , sensitivity of 7 uV/mm, display speed of 62m/sec with a '60Hz'$  notched filter applied as appropriate. EEG data were recorded continuously and digitally stored.  Video monitoring was available and reviewed as appropriate.   Description: At the beginning of the study, EEG showed near continuous background suppression. Low amplitude 3 to 5 Hz theta- delta slowing was also noted in bifrontal region.   ABNORMALITY -Continue slow, bifrontal region - Background suppression, generalized   IMPRESSION: This study is suggestive of cortical  dysfunction arising from bifrontal region as well as profound diffuse encephalopathy, most likely secondary to anoxic brain injury in the setting of cardiac arrest.  No definite seizure was seen during the study.    Arrington Bencomo OBarbra Sarks

## 2022-05-04 NOTE — Progress Notes (Signed)
Daily Progress Note   Patient Name: Ruth Gutierrez [id number]       Date: May 06, 2022 DOB: 1965/10/30  Age: 56 y.o. MRN#: [id number] Attending Physician: Candee Furbish, MD Primary Care Physician: Gaylan Gerold, DO Admit Date: 05/01/2022  Reason for Consultation/Follow-up: Establishing goals of care  Patient Profile/HPI:   56 y.o. female  with past medical history of end-stage renal disease status posttransplant and on hemodialysis, recent parathyroidectomy,  admitted on 04/29/2022 with altered mental status and seizures secondary to severe hypocalcemia from her recent parathyroidectomy.  She had cardiac arrest on 9/4 presumed related to aspiration during seizure.  Received CPR for 19 minutes before ROSC.  MRIs on 9/7 and 9/11 indicate diffuse anoxic brain injury.  She continues to be vent dependent.  She opens her eyes to voice but does not track.  Yesterday evening she began to have recurrent seizures.  EEG in process.  Palliative medicine consulted for goals of care.  Subjective: Chart reviewed. Noted patient with worsening seizures, biting her tongue. Requiring propofol for sedation.  Present with family as Dr. Hortense Ramal reviewed her medical status and recommendations for comfort measures only. Family all in agreement for extubation and transition to full comfort measures only. Discussed process of extubation, use of medications for comfort and no CPR. Family requested Chaplain presence.   Review of Systems  Unable to perform ROS: Intubated     Physical Exam Vitals and nursing note reviewed.  Constitutional:      Appearance: She is ill-appearing.  HENT:     Mouth/Throat:     Comments: Tongue protruding, ulcerated from biting Cardiovascular:     Rate and Rhythm: Normal rate.  Pulmonary:      Comments: intubated Neurological:     Comments: Sedated, Shivering, tongue biting             Vital Signs: BP (!) 189/85   Pulse (!) 106   Temp 98.4 F (36.9 C) (Rectal)   Resp (!) 36   Ht '5\' 4"'$  (1.626 m)   Wt 77 kg   LMP 06/02/2015   SpO2 100%   BMI 29.14 kg/m  SpO2: SpO2: 100 % O2 Device: O2 Device: Ventilator O2 Flow Rate: O2 Flow Rate (L/min): 40 L/min  Intake/output summary:  Intake/Output Summary (Last 24 hours) at 05/06/2022 1006 Last data filed at 2022-05-06  0700 Gross per 24 hour  Intake 1220.77 ml  Output 700 ml  Net 520.77 ml   LBM: Last BM Date : 04/15/22 Baseline Weight: Weight: 70.1 kg Most recent weight: Weight: 77 kg       Palliative Assessment/Data: PPS: 10%      Patient Active Problem List   Diagnosis Date Noted   Acute metabolic encephalopathy 42/68/3419   Status epilepticus ([place]) 04/05/2022   H/O parathyroidectomy ([place]) 03/23/2022   Hypocalcemia 03/21/2022   Hyperparathyroidism ([place]) 03/18/2022   Secondary hyperparathyroidism of renal origin ([place]) 03/18/2022   Rectal bleed    Symptomatic anemia 10/22/2021   Depression 07/02/2021   Lipoma of hip 06/11/2021   Left hip pain 12/04/2020   Healthcare maintenance 11/20/2020   Status post resection of meningioma 11/10/2020   Right shoulder pain 11/01/2019   Renal dialysis device, implant, or graft complication 62/22/9798   ESRD on hemodialysis ([place])    Pneumonia involving left lung 11/12/2015   Secondary renal hyperparathyroidism ([place]) 09/22/2014   Tobacco abuse 06/02/2011   Hypertension 04/04/2011   S/P kidney transplant 04/04/2011    Palliative Care Assessment & Plan    Assessment/Recommendations/Plan  Recommend continuing propofol and seizure medications, titrate propofol for comfort - recommend titrate 10-'100mg'$ /hr with 20-'50mg'$  boluses as needed Extubate to comfort D/C all other interventions and medications not required for comfort Code status change to DNR Chaplain notified for  visit   Code Status: DNR  Prognosis:  Hours - Days  Discharge Planning: Anticipated Hospital Death  Care plan was discussed with family and care team.   Thank you for allowing the Palliative Medicine Team to assist in the care of this patient.  Greater than 50%  of this time was spent counseling and coordinating care related to the above assessment and plan.  Mariana Kaufman, AGNP-C Palliative Medicine   Please contact Palliative Medicine Team phone at [phone] for questions and concerns.

## 2022-05-04 NOTE — Progress Notes (Signed)
Patient was compassionately extubated at 1248 per MD orders/family wishes.

## 2022-05-04 NOTE — Death Summary Note (Signed)
  Name: Ruth Gutierrez MRN: [id number] DOB: 1966/07/02 56 y.o.  Date of Admission: 04/17/2022  8:06 PM Date of Discharge: 2022-05-05 Attending Physician: Candee Furbish, MD  Discharge Diagnosis: Principal Problem:   Acute metabolic encephalopathy Active Problems:   Hypertension   ESRD on hemodialysis [facility])   Status post resection of meningioma   Hypocalcemia   H/O parathyroidectomy ([place])   Status epilepticus ([place]) Anoxic Brain Injury  Cause of death: Anoxic Brain Injury Time of death: 13:14  Disposition and follow-up:   [name] was discharged from [facility] in expired condition.    Hospital Course: [name] was admitted to the hospital on 04/08/2022 with acute metabolic encephalopathy secondary to severe hypercalcemia and status epilepticus after parathyroidectomy.  Neurology was consulted and a bifrontal origin of her seizures was identified in an area where she had a meningioma removed in April 2022.  She was placed on antiseizure medication with adequate control.  Unfortunately she had a cardiac arrest on 04/07/2022 with a downtime of 19 minutes.  She was intubated and admitted to the ICU.  Evaluation the ICU showed poor neurologic status even off all sedation.  MRI of the brain 72 hours after cardiac arrest showed diffuse hypoxic brain injury, this was confirmed again 4 days later with repeat MRI.  She continued to show no signs of improvement but continued to have a strong cough reflex and corneal reflex.  Goals of care conversations were initiated early on in her ICU stay with neurology and palliative care involved.  We discussed the patient's poor prognosis and that the evidence available showed minimal to no chance of meaningful recovery.  We discussed that the patient would need 24-hour care including ventilator and feeding tube to stay alive.  Initial results of these conversations were to allow more time for any possible recovery.   Unfortunately on 04/15/2022 she showed acute signs of worsening with repeated myoclonic movements.  Along with the neurology team and palliative care team we again conveyed to the family that she had minimal to no chances of meaningful recovery.  On May 05, 2022 the family elected to place the patient on inpatient comfort care.  On 05-05-22 she was palliatively extubated at 12:48 PM and passed away peacefully with loved ones at her side at 1:14 PM.  At the time of death patient had no heart or lung sounds and no pulse confirmed by myself and nursing.  Signed: Johny Blamer, DO May 05, 2022, 1:17 PM

## 2022-05-04 DEATH — deceased

## 2022-06-11 MED FILL — Medication: Qty: 1 | Status: AC
